# Patient Record
Sex: Female | Born: 1940 | Race: Black or African American | Hispanic: No | Smoking: Never smoker
Health system: Southern US, Community
[De-identification: ages and names within clinical notes are randomized; demographics above are authoritative.]

## PROBLEM LIST (undated history)

## (undated) DIAGNOSIS — R600 Localized edema: Secondary | ICD-10-CM

## (undated) DIAGNOSIS — M1990 Unspecified osteoarthritis, unspecified site: Secondary | ICD-10-CM

## (undated) DIAGNOSIS — K5790 Diverticulosis of intestine, part unspecified, without perforation or abscess without bleeding: Secondary | ICD-10-CM

## (undated) DIAGNOSIS — Z8709 Personal history of other diseases of the respiratory system: Secondary | ICD-10-CM

## (undated) DIAGNOSIS — D649 Anemia, unspecified: Secondary | ICD-10-CM

## (undated) DIAGNOSIS — R51 Headache: Secondary | ICD-10-CM

## (undated) DIAGNOSIS — M069 Rheumatoid arthritis, unspecified: Secondary | ICD-10-CM

## (undated) DIAGNOSIS — E785 Hyperlipidemia, unspecified: Secondary | ICD-10-CM

## (undated) DIAGNOSIS — F419 Anxiety disorder, unspecified: Secondary | ICD-10-CM

## (undated) DIAGNOSIS — M12811 Other specific arthropathies, not elsewhere classified, right shoulder: Secondary | ICD-10-CM

## (undated) DIAGNOSIS — E039 Hypothyroidism, unspecified: Secondary | ICD-10-CM

## (undated) DIAGNOSIS — I10 Essential (primary) hypertension: Secondary | ICD-10-CM

## (undated) DIAGNOSIS — K284 Chronic or unspecified gastrojejunal ulcer with hemorrhage: Secondary | ICD-10-CM

## (undated) DIAGNOSIS — M12812 Other specific arthropathies, not elsewhere classified, left shoulder: Secondary | ICD-10-CM

## (undated) DIAGNOSIS — R200 Anesthesia of skin: Secondary | ICD-10-CM

## (undated) DIAGNOSIS — K259 Gastric ulcer, unspecified as acute or chronic, without hemorrhage or perforation: Secondary | ICD-10-CM

## (undated) DIAGNOSIS — G4730 Sleep apnea, unspecified: Secondary | ICD-10-CM

## (undated) DIAGNOSIS — E669 Obesity, unspecified: Secondary | ICD-10-CM

## (undated) DIAGNOSIS — Z972 Presence of dental prosthetic device (complete) (partial): Secondary | ICD-10-CM

## (undated) DIAGNOSIS — Z9889 Other specified postprocedural states: Secondary | ICD-10-CM

## (undated) DIAGNOSIS — M810 Age-related osteoporosis without current pathological fracture: Secondary | ICD-10-CM

## (undated) DIAGNOSIS — Z9289 Personal history of other medical treatment: Secondary | ICD-10-CM

## (undated) DIAGNOSIS — M75102 Unspecified rotator cuff tear or rupture of left shoulder, not specified as traumatic: Secondary | ICD-10-CM

## (undated) DIAGNOSIS — M25559 Pain in unspecified hip: Secondary | ICD-10-CM

## (undated) DIAGNOSIS — R609 Edema, unspecified: Secondary | ICD-10-CM

## (undated) DIAGNOSIS — K2100 Gastro-esophageal reflux disease with esophagitis, without bleeding: Secondary | ICD-10-CM

## (undated) DIAGNOSIS — M2550 Pain in unspecified joint: Secondary | ICD-10-CM

## (undated) DIAGNOSIS — G4700 Insomnia, unspecified: Secondary | ICD-10-CM

## (undated) DIAGNOSIS — R351 Nocturia: Secondary | ICD-10-CM

## (undated) DIAGNOSIS — H269 Unspecified cataract: Secondary | ICD-10-CM

## (undated) DIAGNOSIS — R931 Abnormal findings on diagnostic imaging of heart and coronary circulation: Secondary | ICD-10-CM

## (undated) DIAGNOSIS — M1712 Unilateral primary osteoarthritis, left knee: Secondary | ICD-10-CM

## (undated) DIAGNOSIS — Z973 Presence of spectacles and contact lenses: Secondary | ICD-10-CM

## (undated) DIAGNOSIS — E079 Disorder of thyroid, unspecified: Secondary | ICD-10-CM

## (undated) DIAGNOSIS — Z9884 Bariatric surgery status: Secondary | ICD-10-CM

## (undated) DIAGNOSIS — K219 Gastro-esophageal reflux disease without esophagitis: Secondary | ICD-10-CM

## (undated) DIAGNOSIS — I5032 Chronic diastolic (congestive) heart failure: Secondary | ICD-10-CM

## (undated) DIAGNOSIS — R112 Nausea with vomiting, unspecified: Secondary | ICD-10-CM

## (undated) DIAGNOSIS — S46002D Unspecified injury of muscle(s) and tendon(s) of the rotator cuff of left shoulder, subsequent encounter: Secondary | ICD-10-CM

## (undated) HISTORY — DX: Hypothyroidism, unspecified: E03.9

## (undated) HISTORY — DX: Essential (primary) hypertension: I10

## (undated) HISTORY — PX: TOTAL THYROIDECTOMY: SHX2547

## (undated) HISTORY — DX: Hyperlipidemia, unspecified: E78.5

## (undated) HISTORY — PX: UPPER GASTROINTESTINAL ENDOSCOPY: SHX188

## (undated) HISTORY — PX: COLONOSCOPY: SHX174

## (undated) HISTORY — DX: Chronic or unspecified gastrojejunal ulcer with hemorrhage: K28.4

## (undated) HISTORY — DX: Anxiety disorder, unspecified: F41.9

## (undated) HISTORY — DX: Disorder of thyroid, unspecified: E07.9

## (undated) HISTORY — DX: Abnormal findings on diagnostic imaging of heart and coronary circulation: R93.1

## (undated) HISTORY — DX: Gastro-esophageal reflux disease with esophagitis, without bleeding: K21.00

## (undated) HISTORY — DX: Pain in unspecified hip: M25.559

## (undated) HISTORY — PX: JOINT REPLACEMENT: SHX530

## (undated) HISTORY — DX: Unspecified osteoarthritis, unspecified site: M19.90

## (undated) HISTORY — DX: Obesity, unspecified: E66.9

## (undated) HISTORY — PX: ABDOMINAL HYSTERECTOMY: SHX81

## (undated) HISTORY — DX: Rheumatoid arthritis, unspecified: M06.9

## (undated) HISTORY — DX: Unspecified cataract: H26.9

## (undated) HISTORY — PX: EYE SURGERY: SHX253

## (undated) HISTORY — DX: Sleep apnea, unspecified: G47.30

## (undated) HISTORY — DX: Localized edema: R60.0

## (undated) HISTORY — DX: Unspecified injury of muscle(s) and tendon(s) of the rotator cuff of left shoulder, subsequent encounter: S46.002D

## (undated) HISTORY — PX: ROTATOR CUFF REPAIR: SHX139

## (undated) HISTORY — DX: Bariatric surgery status: Z98.84

## (undated) HISTORY — PX: OTHER SURGICAL HISTORY: SHX169

## (undated) HISTORY — PX: CHOLECYSTECTOMY: SHX55

---

## 1998-05-19 ENCOUNTER — Encounter: Payer: Self-pay | Admitting: Orthopedic Surgery

## 1998-05-21 ENCOUNTER — Inpatient Hospital Stay (HOSPITAL_COMMUNITY): Admission: RE | Admit: 1998-05-21 | Discharge: 1998-05-27 | Payer: Self-pay | Admitting: Orthopedic Surgery

## 1998-05-21 ENCOUNTER — Encounter: Payer: Self-pay | Admitting: Orthopedic Surgery

## 1998-05-27 ENCOUNTER — Encounter: Payer: Self-pay | Admitting: Orthopedic Surgery

## 1998-08-27 ENCOUNTER — Encounter: Payer: Self-pay | Admitting: Orthopedic Surgery

## 1998-08-27 ENCOUNTER — Inpatient Hospital Stay (HOSPITAL_COMMUNITY): Admission: RE | Admit: 1998-08-27 | Discharge: 1998-09-02 | Payer: Self-pay | Admitting: Orthopedic Surgery

## 1999-02-23 ENCOUNTER — Inpatient Hospital Stay (HOSPITAL_COMMUNITY): Admission: AD | Admit: 1999-02-23 | Discharge: 1999-03-04 | Payer: Self-pay | Admitting: Orthopedic Surgery

## 1999-02-24 ENCOUNTER — Encounter: Payer: Self-pay | Admitting: Orthopedic Surgery

## 2002-05-17 ENCOUNTER — Encounter: Payer: Self-pay | Admitting: Orthopedic Surgery

## 2002-05-21 ENCOUNTER — Inpatient Hospital Stay (HOSPITAL_COMMUNITY): Admission: RE | Admit: 2002-05-21 | Discharge: 2002-05-26 | Payer: Self-pay | Admitting: Orthopedic Surgery

## 2002-05-21 ENCOUNTER — Encounter: Payer: Self-pay | Admitting: Orthopedic Surgery

## 2002-05-22 ENCOUNTER — Encounter: Payer: Self-pay | Admitting: Orthopedic Surgery

## 2002-05-26 ENCOUNTER — Inpatient Hospital Stay
Admission: RE | Admit: 2002-05-26 | Discharge: 2002-06-01 | Payer: Self-pay | Admitting: Physical Medicine & Rehabilitation

## 2002-07-10 ENCOUNTER — Encounter (HOSPITAL_COMMUNITY): Admission: RE | Admit: 2002-07-10 | Discharge: 2002-08-09 | Payer: Self-pay | Admitting: Orthopedic Surgery

## 2003-05-09 ENCOUNTER — Other Ambulatory Visit: Admission: RE | Admit: 2003-05-09 | Discharge: 2003-05-09 | Payer: Self-pay | Admitting: General Surgery

## 2003-05-28 ENCOUNTER — Ambulatory Visit (HOSPITAL_COMMUNITY): Admission: RE | Admit: 2003-05-28 | Discharge: 2003-05-28 | Payer: Self-pay | Admitting: Pulmonary Disease

## 2005-03-23 ENCOUNTER — Encounter: Admission: RE | Admit: 2005-03-23 | Discharge: 2005-03-23 | Payer: Self-pay | Admitting: Otolaryngology

## 2005-03-31 ENCOUNTER — Ambulatory Visit (HOSPITAL_COMMUNITY): Admission: RE | Admit: 2005-03-31 | Discharge: 2005-04-02 | Payer: Self-pay | Admitting: Otolaryngology

## 2005-03-31 ENCOUNTER — Encounter (INDEPENDENT_AMBULATORY_CARE_PROVIDER_SITE_OTHER): Payer: Self-pay | Admitting: Specialist

## 2006-03-28 ENCOUNTER — Ambulatory Visit (HOSPITAL_COMMUNITY): Admission: RE | Admit: 2006-03-28 | Discharge: 2006-03-28 | Payer: Self-pay | Admitting: Pulmonary Disease

## 2007-05-01 ENCOUNTER — Ambulatory Visit (HOSPITAL_COMMUNITY): Admission: RE | Admit: 2007-05-01 | Discharge: 2007-05-01 | Payer: Self-pay | Admitting: Pulmonary Disease

## 2008-05-07 ENCOUNTER — Ambulatory Visit (HOSPITAL_COMMUNITY): Admission: RE | Admit: 2008-05-07 | Discharge: 2008-05-07 | Payer: Self-pay | Admitting: Pulmonary Disease

## 2009-05-20 ENCOUNTER — Ambulatory Visit (HOSPITAL_COMMUNITY): Admission: RE | Admit: 2009-05-20 | Discharge: 2009-05-20 | Payer: Self-pay | Admitting: Pulmonary Disease

## 2009-09-15 DIAGNOSIS — Z9884 Bariatric surgery status: Secondary | ICD-10-CM | POA: Insufficient documentation

## 2010-06-15 ENCOUNTER — Ambulatory Visit (HOSPITAL_COMMUNITY): Admission: RE | Admit: 2010-06-15 | Discharge: 2010-06-15 | Payer: Self-pay | Admitting: Pulmonary Disease

## 2010-12-18 NOTE — Discharge Summary (Signed)
NAMEMARGARETTE, [name]                          ACCOUNT NO.:  [id number]   MEDICAL RECORD NO.:  [id number]                   PATIENT TYPE:  INP   LOCATION:  5036                                 FACILITY:  [facility]   PHYSICIAN:  Mila Homer. Sherlean Foot, M.D.              DATE OF BIRTH:  [birth year]   DATE OF ADMISSION:  05/21/2002  DATE OF DISCHARGE:  05/26/2002                                 DISCHARGE SUMMARY   ADMISSION DIAGNOSES:  1. Failed right total hip arthroplasty.  2. Hypertension.  3. Thyroid disease.  4. Obesity.   DISCHARGE DIAGNOSES:  1. Revision, right total hip arthroplasty.  2. Hypertension.  3. Thyroid disease.  4. Obesity.  5. Asymptomatic postoperative blood loss anemia.   HISTORY OF PRESENT ILLNESS:  The patient is a 70 year old black female with  a history of right total hip arthroplasty in 69.  She did develop a  superficial abscess in 2000 but otherwise, the patient had good results with  her hip.  The patient did start having soreness in her right hip  approximately two weeks prior to admission into the hospital and a popping  and a sense of it dislocating one week prior.  The patient denies any known  injury.  She does have soreness over the lateral aspect of the thigh with  any type of rotation.  X-rays revealed a rotated acetabular component with  dislocation of the femoral ball from the acetabular cup.   ALLERGIES:  No known drug allergies.   CURRENT MEDICATIONS:  1. Norvasc 5 mg p.o. every day.  2. Synthroid 0.125 mg p.o. every day.  3. Vioxx 25 mg p.o. every day.  4. Os-Cal 1500 mg p.o. every day.  5. Lasix 40 mg p.o. p.r.n.   SURGICAL PROCEDURE:  On May 21, 2002, the patient was taken to the  operating room by Dr. Mila Homer. Lucey, assisted by Jamelle Rushing, P.A.  Under general anesthesia, the patient underwent a revision of her failed  right total hip arthroplasty with a revision of the acetabular component.  The patient tolerated the  procedure well, there were no complications, no  drains were left in place, estimated blood loss was 500 cc and the patient  was transferred to the recovery room and then to the orthopedic floor in  good condition.   CONSULTANTS:  Routine physical therapy, occupational therapy, rehab, case  management consults were requested.   HOSPITAL COURSE:  On May 21, 2002, the patient was admitted to [facility] under the care of Dr. Georgena Spurling.  The patient was taken to  the OR where a failed right total hip arthroplasty was performed with a  revision of the acetabular component.  Procedure was completed without any  complications, patient tolerated it well and was transferred to the recovery  room and then to the orthopedic floor for routine postop care.  The patient  was started on Lovenox for routine DVT prophylaxis.   The patient then incurred a total of five days of postoperative care on the  orthopedic floor in which the patient did develop some asymptomatic  postoperative blood loss anemia; she also had some low-grade temperatures  which resolved on their own with no other focal cause.  Her vital signs  remained stable.  Her wound remained benign for any signs of infection.  Her  leg remained neuromotor and vascularly intact.  The patient worked well with  physical therapy but due to the fact of her obesity and her living alone, it  was felt that the patient needed a rehab stay prior to being discharged home  so she could be totally independent; arrangements were made on the [facility] and  she was transferred to that unit in good condition.   LABORATORY AND ACCESSORY CLINICAL DATA:  EKG on admission was normal sinus  rhythm at 79 beats per minute.   Chest x-ray preoperatively from a previous hospitalization was no acute  disease.   CBC on October 23rd:  WBC 7.3, hemoglobin 9.2, hematocrit 27.0, platelets  175,000.  Routine chemistries on October 22nd:  Sodium of 137,  potassium of  3.8, glucose 103, BUN 6, creatinine 0.8.  A routine urinalysis on admission  was negative.   MEDICATIONS UPON DISCHARGE FROM ORTHOPEDIC FLOOR:  1. Colace 100 mg p.o. b.i.d.  2. Trinsicon one capsule p.o. t.i.d.  3. Lovenox 30 mg subcut. q.12h.  4. Norvasc 5 mg p.o. every day.  5. Synthroid 0.125 mg p.o. every day.  6. Lasix 40 mg p.o. every day.  7. Calcium carbonate 1500 mg p.o. every day.  8. Vioxx 50 mg p.o. every day.  9. Laxative or enema of choice p.r.n.  10.      Percocet one or two tablets every four to six hours p.r.n.  11.      Tylenol 650 mg p.o. q.4h. p.r.n.  12.      Robaxin 500 mg p.o. q.6h. p.r.n.   DISCHARGE INSTRUCTIONS:  1. Medications:  Patient to continue routine medications as dispensed on     orthopedic floor.  2. Diet:  No restrictions.  3. Activity:  The patient may be weightbearing as tolerated with the use of     a walker and close supervision.  4. Wound care:  The patient should have her wound checked daily for any     signs of infection.  Staples are to be removed on postop day #14.  5. Followup:  The patient should have a followup appointment with Dr.     Georgena Spurling two weeks from date of discharge on the subacute care unit.   CONDITION ON DISCHARGE:  The patient's condition upon discharge from [facility] is  good.     Jamelle Rushing, P.A.                      Mila Homer. Sherlean Foot, M.D.   RWK/MEDQ  D:  06/12/2002  T:  06/13/2002  Job:  [id number]

## 2010-12-18 NOTE — Discharge Summary (Signed)
[name], [name]                          ACCOUNT NO.:  [id number]   MEDICAL RECORD NO.:  [id number]                   PATIENT TYPE:   LOCATION:                                       FACILITY:  [facility]   PHYSICIAN:  [name], M.D.             DATE OF BIRTH:  06-Mar-1941   DATE OF ADMISSION:  05/26/2002  DATE OF DISCHARGE:  06/01/2002                                 DISCHARGE SUMMARY   DISCHARGE DIAGNOSES:  1. Revision, right total hip arthroplasty 05/21/02.  2. Anemia.  3. Hypertension.  4. Hypothyroidism.  5. 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   HISTORY OF PRESENT ILLNESS:  A 70 year old black female with history of a  right total hip replacement  in '99, subsequent superficial abscess in 2000,  admitted 10/20 with increased right hip pain, recent dislocation. On  evaluation, x-rays with rotated acetabulum of the cup with dislocation.  Underwent revised right total hip arthroplasty 10/20 per Dr. Sherlean [name]. Placed  on subcutaneous Lovenox for deep vein thrombosis prophylaxis, weight bearing  as tolerated with hip precautions. Hospital course uneventful. No chest  pain. No shortness of breath. Closed supervision for ambulation. Minimal  assist transfers. Latest hemoglobin 9.2. Chemistries unremarkable. Admitted  for comprehensive rehab program.   PAST MEDICAL HISTORY:  Past medical history of hypertension and  hypothyroidism.   PAST SURGICAL HISTORY:  1. Hysterectomy.  2. Cholecystectomy.   ALLERGIES:  None.   HABITS:  No alcohol or tobacco.   MEDICAL PHYSICIAN:  [name], M.D., of [place].   MEDICATIONS PRIOR TO ADMISSION:  1. Norvasc.  2. Synthroid.  3. Vioxx.  4. Os-Cal.  5. Lasix as needed.   SOCIAL HISTORY:  Lives alone in [name]. Independent prior to admission.  She is on disability. She lives in a one-level home with four steps to  entry. Local brother with poor health, limited assistance. She does have a  sister who can  provide some care.   HOSPITAL COURSE:  The patient did well on rehabilitation services with  therapies initiated daily. The following issues are followed during  patient's rehab course:  1. Pertaining to [name]'s revised right total hip arthroplasty:     Surgical site healing nicely. No signs of infection. Neurovascular     sensation remained intact. She was ambulating with supervision, weight     bearing as tolerated with hip precautions. She would follow up with Dr.     Sherlean [name] of orthopedic services. She was maintained on subcutaneous Lovenox     for deep vein thrombosis prophylaxis. She would complete Lovenox protocol     upon her discharge. Postoperative anemia was stable, latest hemoglobin     10.1, hematocrit 31.3. There were no bleeding episodes. Blood pressure is     controlled on home doses of Norvasc. There was no orthostatic changes.     She had no bowel or  bladder disturbances. She continued on hormone     therapy for her hypothyroidism. She had a history of a left total hip     replacement in '99. This was without issue during her rehab course.   LABORATORY DATA:  Latest labs showed a sodium of 139, potassium 4.1, BUN 15,  creatinine 0.9, hemoglobin 10.1, hematocrit 31.3.   FUNCTIONAL MOBILITY:  At time of discharge, she was supervision for all  areas of activities of daily living except needing some assistance for lower  body bathing, supervision for her toileting, ambulating with supervision.  Home health therapy would be provided by [name].   DISCHARGE MEDICATIONS:  1. Lovenox 30 mg twice daily for three more days and discontinue.  2. Trinsicon one capsule twice daily.  3. Norvasc 5 mg daily.  4. Synthroid 125 mcg daily.  5. Vioxx 25 mg daily.  6. Tylox as needed pain.   ACTIVITY:  Weight bearing as tolerated with hip precautions.   DIET:  Regular.   WOUND CARE:  Cleanse incision daily with warm soap and water. Call Dr. Sherlean [name]  if any increased redness,  drainage, or fever.    SPECIAL INSTRUCTIONS:  Home health physical and occupational therapy per  Kathy [name]. She should follow up with Dr. Kari [name], medical management.     Kathy Dollar, PA                       [name], M.D.    DA/MEDQ  D:  05/31/2002  T:  05/31/2002  Job:  [id number]   cc:   [name], M.D.  [address] [place]  [place] [address]  Fax: [phone]   Mila Homer. [name], M.D.   [name], M.D.

## 2010-12-18 NOTE — Discharge Summary (Signed)
[name], [name]                ACCOUNT NO.:  [id number]   MEDICAL RECORD NO.:  [id number]          PATIENT TYPE:  INP   LOCATION:  5729                         FACILITY:  [facility]   PHYSICIAN:  Kinnie Manny. Annalee Genta, M.D.DATE OF BIRTH:  1940-11-17   DATE OF ADMISSION:  03/31/2005  DATE OF DISCHARGE:  04/02/2005                                 DISCHARGE SUMMARY   ADMISSION DIAGNOSIS:  Large bilateral thyroid mass.   POSTOPERATIVE DIAGNOSES:  1.  Large bilateral thyroid mass.  2.  Transient postoperative hypocalcemia.   SURGICAL PROCEDURE:  Total thyroidectomy with nerve integrity monitoring  system (March 31, 2005).   DISPOSITION:  The patient is discharged to home in stable condition in the  company of her family.   DISCHARGE MEDICATIONS:  Discharge medications include her admission  medications:  1.  Voltaren 75 mg p.o. b.i.d.  2.  Norvasc 5 mg p.o. q.p.m.  3.  Synthroid 0.125 mg p.o. q.a.m.  4.  Calcium with vitamin D two tablets p.o. b.i.d.  5.  Darvocet on hold.   Additional discharge medications include:  1.  Augmentin 500 mg p.o. b.i.d. for 10 days.  2.  Percocet 5/325 mg one to two tablets every 4-6 hours as needed, dispense      30 without refills.   ADDITIONAL DISCHARGE INSTRUCTIONS:   WOUND CARE:  One-half-strength hydrogen peroxide and bacitracin ointment on  a twice-daily basis.  The patient may shower and bathe after April 05, 2005.   ACTIVITY:  Activity is limited, no lifting, straining or driving for 2  weeks.   DIET:  The patient has no dietary restrictions.   FOLLOWUP:  She will follow up with me in 1 week for postoperative care or  sooner if warranted.   BRIEF HISTORY:  The patient is a 70 year old black female with a  longstanding history of gradually enlarging thyroid mass.  The patient has  been seen and treated by an endocrinologist in [place] and despite  thyroid suppression therapy with Synthroid, the patient has had an increase  in  the size of her thyroid mass.  She was having mild symptoms of dysphagia  and compression.  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  There were also some areas of calcification significant bilateral  compression of the trachea.  Given the patient's history, the examination  and physical findings, I recommended we undertake a total thyroidectomy  under general anesthesia at [facility].  The risks, benefits and  possible complications of the surgical procedure were discussed in detail  and the patient understood and concurred with our plan for surgery, which  was scheduled as above.   HOSPITAL COURSE:  The patient was admitted to the ENT service under Dr.  Thurmon Fair care on March 31, 2005 at [facility]  She was taken  to the operating room and under general anesthesia, a total thyroidectomy  was performed without complication or difficulty.  She was transferred from  the operating room to the Recovery and from Recovery to unit 5700 for  postoperative care.   The patient's postoperative course was uneventful.  She was ambulating on  the first night and tolerating a soft oral and liquid diet without  difficulty.  Pain was well-controlled with prescribed oral pain medications.  The patient's JP drain showed gradual reduction in output and on the morning  of the second postoperative day, the Jackson-Pratt drain was removed without  difficulty.  The patient's incision was dry and intact without erythema or  swelling.  She had good voicing and normal swallowing.  She had normal bowel  and bladder function.  The patient's serum blood calcium was also followed,  starting within normal limits; it had gradually drifted down and on the  second postoperative morning, was 7.9.  The patient was completely  asymptomatic and her medications including oral calcium supplementation with  vitamin D  were doubled.  She was instructed regarding signs of hypocalcemia  and will contact our office for further outpatient therapy if additional  symptoms occur.  The patient is comfortable with her discharge planning and  is discharged in the company of her family with the above discharge  instructions.  She will follow up with me in 1 week for postoperative care  or sooner if warranted.           ______________________________  Kinnie Ohagan. Annalee Genta, M.D.     DLS/MEDQ  D:  56/21/3086  T:  04/02/2005  Job:  [id number]

## 2010-12-18 NOTE — H&P (Signed)
[name], Kathy [name]                          ACCOUNT NO.:  [id number]   MEDICAL RECORD NO.:  [id number]                   PATIENT TYPE:  INP   LOCATION:  NA                                   FACILITY:  [facility]   PHYSICIAN:  Mila Homer. Sherlean Foot, M.D.              DATE OF BIRTH:  1940-11-15   DATE OF ADMISSION:  05/15/2002  DATE OF DISCHARGE:                                HISTORY & PHYSICAL   CHIEF COMPLAINT:  Right hip soreness with a sensation of dislocating.   HISTORY OF PRESENT ILLNESS:  The patient is a 70 year old black female with  a history of right total hip arthroplasty in 1999.  The patient then  subsequently had a superficial abscess of the right hip incision in 2000,  was taken back with I&D and had good results up until approximately two  weeks ago.  The patient started developing a general soreness in her right  hip with any type of ambulation.  Just last week she started noting a  sensation as if the ball was slipping in and out of the hip. She presented  to Dr. Candise Bowens office and x-rays were taken and it was shown that the hip  was currently dislocated with a rotation of the acetabular component  completely vertical position. The patient describes soreness over the  lateral aspect of the hip with any type of walking or movement. She does  have some sensation of popping in and out of the right hip. She denies any  known injury to it or any cause. She is currently using a cane since this  last Friday.   ALLERGIES:  No known drug allergies.   MEDICATIONS:  1. Norvasc 5 mg p.o. q.d.  2. Synthroid 0.125 mg p.o. q.d.  3. Vioxx 25 mg p.o. q.d.  4. Os-Cal 1500 mg p.o. q.d.  5. Lasix 40 mg p.o. p.r.n.   PAST MEDICAL HISTORY:  Hypertension.  Thyroid disease. Obesity.   PAST SURGICAL HISTORY:  Hysterectomy, cholecystectomy, right and left total  hip replacements. The patient denies any surgical complications with  anesthesia, but she did develop an abscess over her  right hip six months  status post right total hip arthroplasty.   SOCIAL HISTORY:  The patient is a 70 year old obese black female who denies  any history of smoking or alcohol use.  She is currently divorced.  She  lives in a [place] house with four steps to the main entrance. She is a  disabled Agricultural engineer.   PRIMARY CARE PHYSICIAN:  Edward L. Juanetta Gosling, M.D.   FAMILY HISTORY:  Mother is deceased from congestive heart failure. Father is  deceased from lung cancer. The patient has three brothers with a history of  thyroid cancer and diabetes, neck and back problems.  One sister deceased  with thrombophlebitis after a total knee arthroplasty and one sister alive  with diabetes mellitus.   REVIEW OF  SYSTEMS:  Positive for upper partial plate.  She does use glasses  at all times. She does have some decreased hearing in her left ear. She does  not have any palpitations unless she misses her thyroid medicine.  All other  review of systems are negative.   PHYSICAL EXAMINATION:  VITAL SIGNS:  Height is 5 foot 3 inches, weight 240  pounds, temperature 96.5, blood pressure 160/96, respirations 14, pulse 64  and regular.  GENERAL:  This is a healthy-appearing, well-developed, obese, black female.  She ambulates with a cane in her left hand. She does have difficulty getting  on and off the examination table and she does have difficulty with  ambulation and must stand for a few seconds before initiating ambulation  from the sitting position.  HEENT:  Head was normocephalic and atraumatic, nontender over maxillary and  frontal sinuses.  Pupils equal, round, and reactive to light and  accommodation. Extraocular movements intact. Sclerae is nonicteric.  Conjunctivae pink and moist. External ears without deformities, canals  patent, TMs pearly gray and intact. Gross hearing is intact. Nasal septum  midline. Mucous membranes pink and moist. No polyps.  Oral buccal mucosa  pink and moist and  without lesions.  Upper partial plate was in place. Lower  dentition was in fair repair.  Uvula was midline. The patient is able to  swallow without any difficulty.  NECK:  Supple, no palpable lymphadenopathy.  Thyroid gland was nontender.  The patient had good range of motion of her cervical spine without any  difficulty and no tenderness along the spinal column with percussion.  CHEST:  Lung sounds were clear and equal bilaterally. No wheezes, rales, or  rhonchi.  HEART:  Regular rate and rhythm, S1 and S2 was auscultated. No murmurs,  rubs, or gallops noted.  ABDOMEN:  Round, obese, unable to palpate any hepatosplenomegaly due to  obesity. CVA was nontender. She was nontender to deep palpation. Bowel  sounds were normal active throughout.  EXTREMITIES: Upper extremities were symmetrically size and shape. She had  excellent range of motion of her shoulders, elbows, and wrists. Motor  strength was 5/5.  Lower extremities; the patient had bilateral well-healed  lateral incisions over the hips. The left hip had full extension with some  flexion up to 120 degrees, limited by central obesity.  She had 20 degrees  internal and external rotation without discomfort. The right hip had about 5  degrees short of full extension, flexion up to about 80 degrees. She had  about 5 degrees of internal and external rotation limited by discomfort in  the hip.  Knees were symmetrically size and shape, no signs of erythema or  ecchymosis.  No palpable effusions. She had good range of motion without any  difficulty. Calves were nontender. Ankles were symmetrical with good dorsi  and plantar flexion.  NEUROLOGY:  The patient was conscious, alert, and appropriate.  Held an easy  conversation with examiner. Cranial nerves II-XII grossly intact.  The  patient was grossly intact to light touch sensation from head to toe. PERIPHERAL VASCULATURE: Carotid pulses were 2+, no bruits. Radial pulses 2+.  Dorsalis pedis  1+ and posterior tibial pulses were 2+.  The patient had no  lower extremity varicosities. She had trace edema.  BREASTS:  RECTAL:  GENITOURINARY:  Deferred at this time.   IMPRESSION:  1. Failed left total hip arthroplasty, acetabulum with current dislocation.  2. Hypertension.  3. Thyroid disease.  4. Obesity.   PLAN:  The patient will be admitted to [facility] on May 11, 2002, under the care of Mila Homer. Sherlean Foot, M.D.  The patient will undergo all  routine labs and tests prior to having revision of her right total hip  arthroplasty acetabular component.     Jamelle Rushing, P.A.                      Mila Homer. Sherlean Foot, M.D.    RWK/MEDQ  D:  05/14/2002  T:  05/15/2002  Job:  [id number]

## 2010-12-18 NOTE — Op Note (Signed)
NAMEANALYSSE, QUINONEZ                          ACCOUNT NO.:  [id number]   MEDICAL RECORD NO.:  [id number]                   PATIENT TYPE:  INP   LOCATION:  2550                                 FACILITY:  [facility]   PHYSICIAN:  Mila Homer. Sherlean Foot, M.D.              DATE OF BIRTH:  [birth year]   DATE OF PROCEDURE:  05/21/2002  DATE OF DISCHARGE:                                 OPERATIVE REPORT   PREOPERATIVE DIAGNOSIS:  Right failed total hip arthroplasty.   POSTOPERATIVE DIAGNOSIS:  Right failed total hip arthroplasty.   PROCEDURE:  Right revision total hip arthroplasty (acetabulum only and  modifier 22 from extraordinary morbid obesity).   ESTIMATED BLOOD LOSS:  500 cc.   COMPLICATIONS:  None.   DRAINS:  None.   INDICATION FOR PROCEDURE:  The patient is an elderly white female who has  undergone total hip arthroplasty by my partner, W. Dava Najjar, M.D., some  years ago and has two weeks' worth of instability.  X-rays were taken and  showed dislocation of the hip, and she was referred to me for surgery by Dr.  Madelon Lips.  Informed consent was obtained.  Please note, modifier 22 was  necessary for this operation.  The patient was probably 10 standard  deviations above her normal body weight and made this surgery five to 10  times more difficult than done on someone with normal body weight.  Also  note that her extraordinary body weight would make the outcome of the  previous surgery as well as this surgery less than optimal.   DESCRIPTION OF PROCEDURE:  The patient was laid supine, administered general  anesthesia, and a Foley catheter placed.  The old incision was used and made  with a #10 blade.  A new 10 blade was used to go down to and through the  fascia lata.  There was actually no layer of fascia lata intact at all.  I  did a bursectomy and then developed a subperiosteal posterior sleeve off of  the femur, taking down the short external rotators, tagging them.  I then  performed an aggressive synovectomy of scar tissue within the hip exposing  the femoral head, which was dislocated and perched on the superior rim of  the bone.  I tamped off the 28 mm head and at this point placed the neck of  the prosthesis anterior to the acetabulum and held it in place with a  Hohmann retractor.  I then continued to perform synovectomy and then used a  drill and a screw to remove the polyethylene.  At this point I placed in the  central locking device into the apex of the cup.  It was so loose that I  could actually remove it just with the inserter.  At this point I used a  rongeur and curette to debride out the soft tissue that was behind the cup.  The cup  was actually fibrous ingrown with approximately 30-40% with no bony  ingrowth whatsoever.  At this point I switched sides of the table with my  physician assistant and sequentially reamed from a 50 up to a 53 and placed  in a Pinnacle fully porous-coated cup size 54 sector, with three holes aimed  superiorly. Once it was tamped down and was very, very stable to rocking of  the pelvis, I placed two superior screws, one screw was 25 mm long, the  other was 20 mm long.  At this point the acetabulum was quite stable, and I  trialled with a 10 degree, 54 mm offset liner, which was a 4+ offset, and  attempted a 36 mm head.  I placed various sizes of 36 mm heads on and chose  a +5.  I then removed the trials, irrigated copiously with the pulse lavage  system, and then tamped in a real 10 degree liner, which was 4+ offset, 54  mm in diameter, and tamped onto a clean Morse taper a 36 mm +5 head.  This  gave it excellent stability.  There was only impingement against her soft  tissue pannus anteriorly, but I felt she had excellent stability and  excellent purchase of the acetabulum into her bone.  At this point I again  irrigated and then closed the posterior soft sleeve through two drill holes  in the trochanter trying to  recreate a fascia lata, and closed that with  interrupted #1 Vicryl, closed the deep soft tissues with a layer of 0  Vicryl, and then a subcuticular 2-0 Vicryl and skin staples.  The patient  tolerated the procedure well.  I dressed with Adaptic, 4 x 4's, ABD, and  sterile Ioban drape.                                               Mila Homer. Sherlean Foot, M.D.    SDL/MEDQ  D:  05/21/2002  T:  05/22/2002  Job:  [id number]

## 2010-12-18 NOTE — Op Note (Signed)
NAMEHAMPTON, Kathy [name]                ACCOUNT NO.:  [id number]   MEDICAL RECORD NO.:  [id number]          PATIENT TYPE:  INP   LOCATION:  2857                         FACILITY:  [facility]   PHYSICIAN:  Kinnie Carrizales. Annalee Genta, M.D.DATE OF BIRTH:  10-04-1940   DATE OF PROCEDURE:  03/31/2005  DATE OF DISCHARGE:                                 OPERATIVE REPORT   PREOPERATIVE DIAGNOSIS:  Diffuse thyroid enlargement consistent with  multinodular goiter.   POSTOPERATIVE DIAGNOSIS:  Diffuse thyroid enlargement consistent with  multinodular goiter.   INDICATIONS FOR SURGERY:  Diffuse thyroid enlargement consistent with  multinodular goiter.   SURGICAL PROCEDURES:  Total thyroidectomy with nerve integrity monitoring  (NIMS).   SURGEON:  Kinnie Mallin. Annalee Genta, M.D.   ASSISTANT:  Dr. Brynda Peon.   ANESTHESIA:  General endotracheal.   ESTIMATED BLOOD LOSS:  100 mL.   COMPLICATIONS:  None.   Patient transferred from the operating room to the recovery in stable  condition.   BRIEF HISTORY:  Kathy [name] is a 70 year old black female who was evaluated  with a history of a gradually enlarging neck mass. The patient had a  longstanding history of multinodular goiter and had been followed by her  endocrinologist in [place], [place]. She had been on thyroid  suppression therapy and despite chronic use of Synthroid, the patient had  continued enlargement of the gland. She was having mild intermittent  pressure sensation with shortness of breath and dyspnea on exertion as well  as some mild intermittent dysphagia. Examination revealed significant  bilateral thyroid gland enlargement consistent with multinodular goiter.  A  CT scan was obtained and this multiple cystic changes within the thyroid  gland with significant bilateral enlargement and compression of the trachea,  left greater than right. Given the patient's history, examination and  progressive symptoms, I recommended that we  undertake a total thyroidectomy  under general anesthesia. Risks, benefits and  possible complications of  procedure were discussed in detail and the patient understood and concurred  with our plan for surgery which is scheduled for March 31, 2005.   SURGICAL PROCEDURE:  The patient brought to the operating room at [facility] OR on March 31, 2005, and placed in the supine  position on the operating table. General endotracheal anesthesia was  established without difficulty. [facility] nerve monitoring system was  used throughout the surgical procedure to insure protection of the recurrent  laryngeal nerves and good bilateral vocal cord contact was confirmed prior  to starting the case. The patient was positioned on the operating table and  injected with a total of 4 mL of 1% lidocaine 1:100,000 solution of  epinephrine injected in the proposed skin incision.  Patient was then  prepped and draped in sterile fashion and the surgical procedure was begun.   An approximately 8 cm curvilinear low collar incision was created.  This was  carried through the skin, underlying subcutaneous tissue, anterior aspect of  platysma muscle was identified bilaterally and divided and subplatysmal  flaps were elevated superiorly and inferiorly to allow access to  the  anterior compartment of the neck. The strap muscles were divided in the  midline and lateralized.  I began left-hand side, the left thyroid lobe was  gently dissected from the overlying strap and sternocleidomastoid muscles  allowing access to the left thyroid lobe. There was significant enlargement  and nodularity of the left thyroid gland and the lobe was gently reflected  medially. The middle thyroid vein was identified, divided and suture  ligated. Dissection was carried superiorly along the superior pedicle of the  left thyroid lobe.  The superior thyroid artery and vein were identified,  divided and suture  ligated with 2-0 silk suture. The gland was then  reflected further medially and dissection was carried out along the lateral  and inferior aspect of the gland.  The recurrent laryngeal nerve was  identified in the immediate subcapsular tissue on the lateral aspect of the  thyroid lobe. This was confirmed with the NIMS monitor and dissection was  then carefully carried out along the nerve, protecting it throughout its  course into its insertion along the cricothyroid membrane.  Dissection was  then carried inferiorly preserving the nerve and dissecting the overlying  soft tissue. The inferior thyroid pedicle was identified, divided and suture  ligated and the gland was then reflected further medially, dividing the  thyroid ligament along the anterior tracheal wall, preserving the trachea.  The patient's right thyroid lobe was then dissected, overlying strap muscles  reflected medially and the sternocleidomastoid muscle was elevated.  The mid  aspect of the thyroid gland was then dissected free of the surrounding soft  tissues and reflected medially.  The middle thyroid vein was identified,  divided and suture ligated. Dissection was then carried superiorly and the  superior thyroid pedicle was divided and suture ligated with 2-0 silk  suture. The gland was then reflected on its medial attachments and  dissection was then carried inferiorly.  The recurrent laryngeal nerve was  identified in the deep soft tissues and the inferior thyroid pedicle  including inferior thyroid artery and vein were elevated preserving the  nerve. These were divided and suture ligated and dissection was carried  along the inferior aspect of the gland, dissecting along the median aspect  of the thyroid capsule, dividing the small feeder vessels using suture  ligature and clips.  The inferior parathyroid gland was identified in this  region and reflected inferiorly. The entire thyroid was then reflected on the right  tracheal attachment and, preserving the recurrent laryngeal nerve,  was dissected free of the trachea.  The entire specimen was then sent to  pathology for gross microscopic evaluation.  The patient's wound  was then  thoroughly irrigated with saline solution. Several areas of point hemorrhage  were cauterized with bipolar cautery. The recurrent laryngeal nerves were  stimulated bilaterally at 0.50 on the NIMS monitor and we had good bilateral  stimulation of the vocal cords.  The patient's wound was again irrigated and  suctioned. There was no active bleeding. A 7 mm Blake drain was placed at  the base of the incision bilaterally and brought out through a separate stab  incision in the mid aspect of the neck, sutured in position with a 3-0  Ethilon suture.  The wound was then closed in multiple layers beginning with  reapproximation of the midline strap muscles with 3-0 Vicryl suture in  interrupted fashion,  platysma and subplatysmal layers were closed  individually with 4-0 Vicryl suture in interrupted fashion and deep  subcutaneous  tissue was closed with the same stitch. Final skin closure was  achieved with a 5-0 Ethilon in  running locked fashion.  The patient's wound was cleaned with saline gauze  and dressed with bacitracin ointment. The patient was then awakened from  anesthetic. She was extubated and transferred from the operating room to the  recovery in stable condition. There were no complications and blood loss was  less than 100 mL.           ______________________________  Kinnie Engelhard. Annalee Genta, M.D.     DLS/MEDQ  D:  74/25/9563  T:  03/31/2005  Job:  [id number]   cc:   Ramon Dredge L. Juanetta Gosling, M.D.  [address]  [place]  [place] [address]  Fax: [phone]

## 2011-02-18 ENCOUNTER — Ambulatory Visit (INDEPENDENT_AMBULATORY_CARE_PROVIDER_SITE_OTHER): Payer: Medicare PPO | Admitting: Otolaryngology

## 2011-02-18 DIAGNOSIS — H902 Conductive hearing loss, unspecified: Secondary | ICD-10-CM

## 2011-02-18 DIAGNOSIS — H6120 Impacted cerumen, unspecified ear: Secondary | ICD-10-CM

## 2011-08-19 ENCOUNTER — Other Ambulatory Visit (HOSPITAL_COMMUNITY): Payer: Self-pay | Admitting: Pulmonary Disease

## 2011-08-19 DIAGNOSIS — Z139 Encounter for screening, unspecified: Secondary | ICD-10-CM

## 2011-08-23 ENCOUNTER — Ambulatory Visit (HOSPITAL_COMMUNITY): Payer: Medicare HMO

## 2011-08-26 ENCOUNTER — Ambulatory Visit (HOSPITAL_COMMUNITY): Payer: Medicare HMO

## 2011-08-30 ENCOUNTER — Ambulatory Visit (HOSPITAL_COMMUNITY)
Admission: RE | Admit: 2011-08-30 | Discharge: 2011-08-30 | Disposition: A | Discharge status: Home / Self Care | Payer: Medicare HMO | Source: Ambulatory Visit | Attending: Pulmonary Disease | Admitting: Pulmonary Disease

## 2011-08-30 DIAGNOSIS — Z139 Encounter for screening, unspecified: Secondary | ICD-10-CM

## 2011-08-30 DIAGNOSIS — Z1231 Encounter for screening mammogram for malignant neoplasm of breast: Secondary | ICD-10-CM | POA: Insufficient documentation

## 2011-10-14 DIAGNOSIS — E569 Vitamin deficiency, unspecified: Secondary | ICD-10-CM | POA: Insufficient documentation

## 2011-10-14 DIAGNOSIS — E46 Unspecified protein-calorie malnutrition: Secondary | ICD-10-CM | POA: Insufficient documentation

## 2012-06-12 ENCOUNTER — Other Ambulatory Visit (HOSPITAL_COMMUNITY): Payer: Self-pay | Admitting: Orthopedic Surgery

## 2012-06-12 DIAGNOSIS — T84038A Mechanical loosening of other internal prosthetic joint, initial encounter: Secondary | ICD-10-CM

## 2012-06-12 DIAGNOSIS — Z96649 Presence of unspecified artificial hip joint: Secondary | ICD-10-CM

## 2012-06-14 ENCOUNTER — Encounter (HOSPITAL_COMMUNITY)
Admission: RE | Admit: 2012-06-14 | Discharge: 2012-06-14 | Disposition: A | Discharge status: Home / Self Care | Payer: Medicare Other | Source: Ambulatory Visit | Attending: Orthopedic Surgery | Admitting: Orthopedic Surgery

## 2012-06-14 ENCOUNTER — Encounter (HOSPITAL_COMMUNITY): Payer: Self-pay

## 2012-06-14 DIAGNOSIS — M25559 Pain in unspecified hip: Secondary | ICD-10-CM | POA: Insufficient documentation

## 2012-06-14 DIAGNOSIS — Z96649 Presence of unspecified artificial hip joint: Secondary | ICD-10-CM

## 2012-06-14 DIAGNOSIS — T84038A Mechanical loosening of other internal prosthetic joint, initial encounter: Secondary | ICD-10-CM

## 2012-06-14 MED ORDER — TECHNETIUM TC 99M MEDRONATE IV KIT
25.0000 | PACK | Freq: Once | INTRAVENOUS | Status: AC | PRN
  Administered 2012-06-14: 25 via INTRAVENOUS

## 2012-08-18 ENCOUNTER — Other Ambulatory Visit (HOSPITAL_COMMUNITY): Payer: Self-pay | Admitting: Pulmonary Disease

## 2012-08-18 DIAGNOSIS — Z139 Encounter for screening, unspecified: Secondary | ICD-10-CM

## 2012-08-31 ENCOUNTER — Ambulatory Visit (HOSPITAL_COMMUNITY): Payer: Medicare HMO

## 2013-06-06 ENCOUNTER — Other Ambulatory Visit: Payer: Self-pay | Admitting: Orthopedic Surgery

## 2013-06-06 ENCOUNTER — Ambulatory Visit
Admission: RE | Admit: 2013-06-06 | Discharge: 2013-06-06 | Disposition: A | Discharge status: Home / Self Care | Payer: Medicare Other | Source: Ambulatory Visit | Attending: Orthopedic Surgery | Admitting: Orthopedic Surgery

## 2013-06-06 DIAGNOSIS — M25552 Pain in left hip: Secondary | ICD-10-CM

## 2013-07-12 ENCOUNTER — Ambulatory Visit (HOSPITAL_COMMUNITY)
Admission: RE | Admit: 2013-07-12 | Discharge: 2013-07-12 | Disposition: A | Discharge status: Home / Self Care | Payer: Medicare Other | Source: Ambulatory Visit | Attending: Pulmonary Disease | Admitting: Pulmonary Disease

## 2013-07-12 DIAGNOSIS — Z139 Encounter for screening, unspecified: Secondary | ICD-10-CM

## 2013-07-12 DIAGNOSIS — Z1231 Encounter for screening mammogram for malignant neoplasm of breast: Secondary | ICD-10-CM | POA: Insufficient documentation

## 2013-08-20 ENCOUNTER — Other Ambulatory Visit (HOSPITAL_COMMUNITY): Payer: Self-pay | Admitting: Orthopedic Surgery

## 2013-08-20 DIAGNOSIS — M25552 Pain in left hip: Secondary | ICD-10-CM

## 2013-08-22 ENCOUNTER — Ambulatory Visit (HOSPITAL_COMMUNITY)
Admission: RE | Admit: 2013-08-22 | Discharge: 2013-08-22 | Disposition: A | Discharge status: Home / Self Care | Payer: Medicare HMO | Source: Ambulatory Visit | Attending: Orthopedic Surgery | Admitting: Orthopedic Surgery

## 2013-08-22 ENCOUNTER — Encounter (HOSPITAL_COMMUNITY): Payer: Self-pay

## 2013-08-22 DIAGNOSIS — M25552 Pain in left hip: Secondary | ICD-10-CM

## 2013-08-22 DIAGNOSIS — M47817 Spondylosis without myelopathy or radiculopathy, lumbosacral region: Secondary | ICD-10-CM | POA: Insufficient documentation

## 2013-08-22 DIAGNOSIS — Z96649 Presence of unspecified artificial hip joint: Secondary | ICD-10-CM | POA: Insufficient documentation

## 2013-08-22 DIAGNOSIS — M51379 Other intervertebral disc degeneration, lumbosacral region without mention of lumbar back pain or lower extremity pain: Secondary | ICD-10-CM | POA: Insufficient documentation

## 2013-08-22 DIAGNOSIS — M25559 Pain in unspecified hip: Secondary | ICD-10-CM | POA: Insufficient documentation

## 2013-08-22 DIAGNOSIS — M5137 Other intervertebral disc degeneration, lumbosacral region: Secondary | ICD-10-CM | POA: Insufficient documentation

## 2013-09-18 ENCOUNTER — Other Ambulatory Visit: Payer: Self-pay | Admitting: Orthopedic Surgery

## 2013-09-19 ENCOUNTER — Encounter (HOSPITAL_COMMUNITY): Payer: Self-pay | Admitting: Pharmacy Technician

## 2013-09-25 NOTE — Pre-Procedure Instructions (Signed)
[place]  09/25/2013   Your procedure is scheduled on:  Wed, Mar 4 @ 12:45 PM  Report to [facility] Short Stay Entrance A  At 10:45 AM.  Call this number if you have problems the morning of surgery: [phone]   Remember:   Do not eat food or drink liquids after midnight.   Take these medicines the morning of surgery with A SIP OF WATER: Xanax(Alprazolam),Amlodipine(Norvasc),Synthroid(Levothyroxine),and Tramadol(if needed)               Stop taking your Fish Oil. Stop using your Diclofenac. No Goody's,BC's,Aleve,Aspirin,Ibuprofen,or any Herbal Medications   Do not wear jewelry, make-up or nail polish.  Do not wear lotions, powders, or perfumes. You may wear deodorant.  Do not shave 48 hours prior to surgery.   Do not bring valuables to the hospital.  [facility] is not responsible                  for any belongings or valuables.               Contacts, dentures or bridgework may not be worn into surgery.  Leave suitcase in the car. After surgery it may be brought to your room.  For patients admitted to the hospital, discharge time is determined by your                treatment team.                 Special Instructions:  [facility] - Preparing for Surgery  Before surgery, you can play an important role.  Because skin is not sterile, your skin needs to be as free of germs as possible.  You can reduce the number of germs on you skin by washing with CHG (chlorahexidine gluconate) soap before surgery.  CHG is an antiseptic cleaner which kills germs and bonds with the skin to continue killing germs even after washing.  Please DO NOT use if you have an allergy to CHG or antibacterial soaps.  If your skin becomes reddened/irritated stop using the CHG and inform your nurse when you arrive at Short Stay.  Do not shave (including legs and underarms) for at least 48 hours prior to the first CHG shower.  You may shave your face.  Please follow these instructions carefully:   1.   Shower with CHG Soap the night before surgery and the                                morning of Surgery.  2.  If you choose to wash your hair, wash your hair first as usual with your       normal shampoo.  3.  After you shampoo, rinse your hair and body thoroughly to remove the                      Shampoo.  4.  Use CHG as you would any other liquid soap.  You can apply chg directly       to the skin and wash gently with scrungie or a clean washcloth.  5.  Apply the CHG Soap to your body ONLY FROM THE NECK DOWN.        Do not use on open wounds or open sores.  Avoid contact with your eyes,       ears, mouth and genitals (private parts).  Wash genitals (private  parts)       with your normal soap.  6.  Wash thoroughly, paying special attention to the area where your surgery        will be performed.  7.  Thoroughly rinse your body with warm water from the neck down.  8.  DO NOT shower/wash with your normal soap after using and rinsing off       the CHG Soap.  9.  Pat yourself dry with a clean towel.            10.  Wear clean pajamas.            11.  Place clean sheets on your bed the night of your first shower and do not        sleep with pets.  Day of Surgery  Do not apply any lotions/deoderants the morning of surgery.  Please wear clean clothes to the hospital/surgery center.     Please read over the following fact sheets that you were given: Pain Booklet, Coughing and Deep Breathing, Blood Transfusion Information, MRSA Information and Surgical Site Infection Prevention

## 2013-09-26 ENCOUNTER — Encounter (HOSPITAL_COMMUNITY)
Admission: RE | Admit: 2013-09-26 | Discharge: 2013-09-26 | Disposition: A | Discharge status: Home / Self Care | Payer: Medicare HMO | Source: Ambulatory Visit | Attending: Orthopedic Surgery | Admitting: Orthopedic Surgery

## 2013-09-26 ENCOUNTER — Encounter (HOSPITAL_COMMUNITY): Payer: Self-pay

## 2013-09-26 DIAGNOSIS — Z01818 Encounter for other preprocedural examination: Secondary | ICD-10-CM | POA: Insufficient documentation

## 2013-09-26 DIAGNOSIS — Z01812 Encounter for preprocedural laboratory examination: Secondary | ICD-10-CM | POA: Insufficient documentation

## 2013-09-26 DIAGNOSIS — Z01810 Encounter for preprocedural cardiovascular examination: Secondary | ICD-10-CM | POA: Insufficient documentation

## 2013-09-26 HISTORY — DX: Unspecified osteoarthritis, unspecified site: M19.90

## 2013-09-26 HISTORY — DX: Personal history of other medical treatment: Z92.89

## 2013-09-26 HISTORY — DX: Personal history of other diseases of the respiratory system: Z87.09

## 2013-09-26 HISTORY — DX: Pain in unspecified joint: M25.50

## 2013-09-26 HISTORY — DX: Age-related osteoporosis without current pathological fracture: M81.0

## 2013-09-26 HISTORY — DX: Edema, unspecified: R60.9

## 2013-09-26 HISTORY — DX: Other specified postprocedural states: R11.2

## 2013-09-26 HISTORY — DX: Essential (primary) hypertension: I10

## 2013-09-26 HISTORY — DX: Other specified postprocedural states: Z98.890

## 2013-09-26 HISTORY — DX: Anesthesia of skin: R20.0

## 2013-09-26 HISTORY — DX: Nocturia: R35.1

## 2013-09-26 HISTORY — DX: Localized edema: R60.0

## 2013-09-26 HISTORY — DX: Headache: R51

## 2013-09-26 HISTORY — DX: Hypothyroidism, unspecified: E03.9

## 2013-09-26 HISTORY — DX: Diverticulosis of intestine, part unspecified, without perforation or abscess without bleeding: K57.90

## 2013-09-26 HISTORY — DX: Anxiety disorder, unspecified: F41.9

## 2013-09-26 LAB — URINALYSIS, ROUTINE W REFLEX MICROSCOPIC
Bilirubin Urine: NEGATIVE
Glucose, UA: NEGATIVE mg/dL
Hgb urine dipstick: NEGATIVE
Ketones, ur: NEGATIVE mg/dL
Leukocytes, UA: NEGATIVE
Nitrite: NEGATIVE
Protein, ur: NEGATIVE mg/dL
Specific Gravity, Urine: 1.017 (ref 1.005–1.030)
Urobilinogen, UA: 0.2 mg/dL (ref 0.0–1.0)
pH: 8.5 — ABNORMAL HIGH (ref 5.0–8.0)

## 2013-09-26 LAB — ABO/RH: ABO/RH(D): AB POS

## 2013-09-26 LAB — CBC WITH DIFFERENTIAL/PLATELET
Basophils Absolute: 0 10*3/uL (ref 0.0–0.1)
Basophils Relative: 0 % (ref 0–1)
Eosinophils Absolute: 0.1 10*3/uL (ref 0.0–0.7)
Eosinophils Relative: 2 % (ref 0–5)
HCT: 35.4 % — ABNORMAL LOW (ref 36.0–46.0)
Hemoglobin: 11.9 g/dL — ABNORMAL LOW (ref 12.0–15.0)
Lymphocytes Relative: 56 % — ABNORMAL HIGH (ref 12–46)
Lymphs Abs: 2.6 10*3/uL (ref 0.7–4.0)
MCH: 31.5 pg (ref 26.0–34.0)
MCHC: 33.6 g/dL (ref 30.0–36.0)
MCV: 93.7 fL (ref 78.0–100.0)
Monocytes Absolute: 0.4 10*3/uL (ref 0.1–1.0)
Monocytes Relative: 8 % (ref 3–12)
Neutro Abs: 1.6 10*3/uL — ABNORMAL LOW (ref 1.7–7.7)
Neutrophils Relative %: 33 % — ABNORMAL LOW (ref 43–77)
Platelets: 217 10*3/uL (ref 150–400)
RBC: 3.78 MIL/uL — ABNORMAL LOW (ref 3.87–5.11)
RDW: 13.8 % (ref 11.5–15.5)
WBC: 4.6 10*3/uL (ref 4.0–10.5)

## 2013-09-26 LAB — BASIC METABOLIC PANEL
BUN: 10 mg/dL (ref 6–23)
CO2: 29 mEq/L (ref 19–32)
Calcium: 9.8 mg/dL (ref 8.4–10.5)
Chloride: 102 mEq/L (ref 96–112)
Creatinine, Ser: 0.8 mg/dL (ref 0.50–1.10)
GFR calc Af Amer: 84 mL/min — ABNORMAL LOW (ref >90)
GFR calc non Af Amer: 72 mL/min — ABNORMAL LOW (ref >90)
Glucose, Bld: 74 mg/dL (ref 70–99)
Potassium: 4 mEq/L (ref 3.7–5.3)
Sodium: 141 mEq/L (ref 137–147)

## 2013-09-26 LAB — PROTIME-INR
INR: 0.96 (ref 0.00–1.49)
Prothrombin Time: 12.6 seconds (ref 11.6–15.2)

## 2013-09-26 LAB — TYPE AND SCREEN
ABO/RH(D): AB POS
Antibody Screen: NEGATIVE

## 2013-09-26 LAB — SURGICAL PCR SCREEN
MRSA, PCR: NEGATIVE
Staphylococcus aureus: NEGATIVE

## 2013-09-26 LAB — APTT: aPTT: 25 seconds (ref 24–37)

## 2013-09-26 MED ORDER — CHLORHEXIDINE GLUCONATE 4 % EX LIQD: 60.0000 mL | Freq: Once | CUTANEOUS | Status: DC

## 2013-09-26 NOTE — Progress Notes (Addendum)
Pt doesn't have a cardiologist  Echo/Stress test done 5+yrs ago prior to gastric bypass-done at [facility]   Denies ever having a heart cath  Denies EKG or CXR in past yr   Medical Md is Dr.Ed Luan Pulling in [place]

## 2013-09-27 NOTE — Progress Notes (Signed)
Anesthesia Chart Review:  Patient is a 73 year old female scheduled for revision of left THA on 10/03/13 by Dr. Mayer Camel.  History includes nonsmoker, postoperative nausea and vomiting, thyroidectomy with secondary hypothyroidism, hypertension, diverticulosis, peripheral edema treated with Lasix PRN, headaches, osteoporosis, anxiety, diverticulosis, history of what transfusion, hysterectomy, gastric bypass surgery > 5 years ago [facility]), cholecystectomy. BMI is 27. PCP is Dr. Sinda Du in [place].  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  She reported prior stress and echo prior to her gastric bypass > 5 years ago. No CV symptoms documented at her PAT visit.  Preoperative CXR and labs noted.    Patient with new right BBB since 2006, but no CV symptoms reported.  No known CAD/MI/CHF, smoking, or DM history.  Further evaluation by her assigned anesthesiologist on the day of surgery, but if she remains asymptomatic from a CV standpoint then I would anticipate that she could proceed as planned.  George Hugh [facility] Short Stay Center/Anesthesiology Phone [phone] 09/27/2013 1:16 PM

## 2013-10-02 MED ORDER — CEFAZOLIN SODIUM-DEXTROSE 2-3 GM-% IV SOLR
2.0000 g | INTRAVENOUS | Status: AC
  Administered 2013-10-03: 2 g via INTRAVENOUS
  Filled 2013-10-02: qty 50

## 2013-10-02 NOTE — H&P (Signed)
TOTAL HIP REVISION ADMISSION H&P  Patient is admitted for left revision total hip arthroplasty.  Subjective:  Chief Complaint: left hip pain  HPI: Kathy [name], 73 y.o. female, has a history of pain and functional disability in the left hip due to arthritis and patient has failed non-surgical conservative treatments for greater than 12 weeks to include NSAID's and/or analgesics, use of assistive devices and activity modification. The indications for the revision total hip arthroplasty are bearing surface wear leading to  symptomatic synovitis.  Onset of symptoms was gradual starting several years ago with gradually worsening course since that time.  Prior procedures on the left hip include arthroplasty.  Patient currently rates pain in the left hip at 10 out of 10 with activity.  There is worsening of pain with activity and weight bearing, pain that interfers with activities of daily living and pain with passive range of motion. Patient has evidence of subchondral cysts and poly wear by imaging studies.  This condition presents safety issues increasing the risk of falls.   There is no current active infection.  There are no active problems to display for this patient.  Past Medical History  Diagnosis Date  . Osteoporosis     takes Fosamax weekly  . Anxiety     takes Alprazolam daily as needed  . Hypertension     takes Amlodipine daily  . Hypothyroidism     takes Synthroid daily  . Peripheral edema     takes Lasix daily as needed  . PONV (postoperative nausea and vomiting)   . History of bronchitis     last time many yrs ago  . Headache(784.0)     occasionally  . Numbness     occasionally left arm at night  . Arthritis   . Joint pain   . Diverticulosis   . Nocturia   . History of blood transfusion     no abnormal reaction noted    Past Surgical History  Procedure Laterality Date  . Cholecystectomy    . Abdominal hysterectomy    . Total thyroidectomy    . Gastric bypass  surgery    . Joint replacement Bilateral     hip  . Colonoscopy    . Cataract surgery Bilateral     No prescriptions prior to admission   No Known Allergies  History  Substance Use Topics  . Smoking status: Never Smoker   . Smokeless tobacco: Not on file  . Alcohol Use: No    No family history on file.    Review of Systems  Constitutional: Negative.   HENT: Negative.   Eyes: Negative.   Respiratory: Negative.   Cardiovascular: Negative.   Gastrointestinal: Negative.   Genitourinary: Negative.   Musculoskeletal: Positive for joint pain.  Skin: Negative.   Neurological: Negative.   Psychiatric/Behavioral: Negative.     Objective:  Physical Exam  Constitutional: She is oriented to person, place, and time. She appears well-developed and well-nourished.  HENT:  Head: Normocephalic and atraumatic.  Eyes: Pupils are equal, round, and reactive to light.  Neck: Normal range of motion. Neck supple.  Cardiovascular: Intact distal pulses.   Respiratory: Effort normal.  Musculoskeletal: She exhibits tenderness.  Neurological: She is alert and oriented to person, place, and time.  Skin: Skin is warm and dry.  Psychiatric: She has a normal mood and affect. Her behavior is normal. Judgment and thought content normal.    Vital signs in last 24 hours:     Labs:  There is no height or weight on file to calculate BMI.  Imaging Review:  Plain radiographs demonstrate a large medial cyst about 2 x 2 centimeters in size and smaller superior cyst.  The polyethylene liner has 1 maybe 2 mm of polyethylene remaining superiorly.  CT scan does show an impressive medial cyst going into the pubis with some medial wall ostial lysis.  However peripherally the acetabulum looks to be intact and more importantly superiorly and posteriorly.  There appears to be an adequate amount of bone.  Anteriorly the bone is relatively thin.  Assessment/Plan:  End stage arthritis, left hip(s) with  failed previous arthroplasty.  The patient history, physical examination, clinical judgement of the provider and imaging studies are consistent with end stage degenerative joint disease of the left hip(s), previous total hip arthroplasty. Revision total hip arthroplasty is deemed medically necessary. The treatment options including medical management, injection therapy, arthroscopy and arthroplasty were discussed at length. The risks and benefits of total hip arthroplasty were presented and reviewed. The risks due to aseptic loosening, infection, stiffness, dislocation/subluxation,  thromboembolic complications and other imponderables were discussed.  The patient acknowledged the explanation, agreed to proceed with the plan and consent was signed. Patient is being admitted for inpatient treatment for surgery, pain control, PT, OT, prophylactic antibiotics, VTE prophylaxis, progressive ambulation and ADL's and discharge planning. The patient is planning to be discharged to skilled nursing facility

## 2013-10-03 ENCOUNTER — Encounter (HOSPITAL_COMMUNITY): Payer: Medicare HMO | Admitting: Vascular Surgery

## 2013-10-03 ENCOUNTER — Inpatient Hospital Stay (HOSPITAL_COMMUNITY): Payer: Medicare HMO | Admitting: Certified Registered Nurse Anesthetist

## 2013-10-03 ENCOUNTER — Inpatient Hospital Stay (HOSPITAL_COMMUNITY): Payer: Medicare HMO

## 2013-10-03 ENCOUNTER — Inpatient Hospital Stay (HOSPITAL_COMMUNITY)
Admission: RE | Admit: 2013-10-03 | Discharge: 2013-10-05 | DRG: 468 | Disposition: A | Discharge status: Skilled Nursing Facility | Payer: Medicare HMO | Source: Ambulatory Visit | Attending: Orthopedic Surgery | Admitting: Orthopedic Surgery

## 2013-10-03 ENCOUNTER — Encounter (HOSPITAL_COMMUNITY): Payer: Self-pay | Admitting: *Deleted

## 2013-10-03 ENCOUNTER — Encounter (HOSPITAL_COMMUNITY)
Admission: RE | Disposition: A | Discharge status: Skilled Nursing Facility | Payer: Self-pay | Source: Ambulatory Visit | Attending: Orthopedic Surgery

## 2013-10-03 DIAGNOSIS — Y838 Other surgical procedures as the cause of abnormal reaction of the patient, or of later complication, without mention of misadventure at the time of the procedure: Secondary | ICD-10-CM | POA: Diagnosis present

## 2013-10-03 DIAGNOSIS — Z9884 Bariatric surgery status: Secondary | ICD-10-CM

## 2013-10-03 DIAGNOSIS — F411 Generalized anxiety disorder: Secondary | ICD-10-CM | POA: Diagnosis present

## 2013-10-03 DIAGNOSIS — Z96649 Presence of unspecified artificial hip joint: Secondary | ICD-10-CM

## 2013-10-03 DIAGNOSIS — T84069A Wear of articular bearing surface of unspecified internal prosthetic joint, initial encounter: Principal | ICD-10-CM | POA: Diagnosis present

## 2013-10-03 DIAGNOSIS — R609 Edema, unspecified: Secondary | ICD-10-CM | POA: Diagnosis present

## 2013-10-03 DIAGNOSIS — E039 Hypothyroidism, unspecified: Secondary | ICD-10-CM | POA: Diagnosis present

## 2013-10-03 DIAGNOSIS — Y92009 Unspecified place in unspecified non-institutional (private) residence as the place of occurrence of the external cause: Secondary | ICD-10-CM

## 2013-10-03 DIAGNOSIS — I10 Essential (primary) hypertension: Secondary | ICD-10-CM | POA: Diagnosis present

## 2013-10-03 DIAGNOSIS — M810 Age-related osteoporosis without current pathological fracture: Secondary | ICD-10-CM | POA: Diagnosis present

## 2013-10-03 HISTORY — PX: REVISION TOTAL HIP ARTHROPLASTY: SHX766

## 2013-10-03 HISTORY — PX: TOTAL HIP REVISION: SHX763

## 2013-10-03 LAB — GRAM STAIN

## 2013-10-03 SURGERY — TOTAL HIP REVISION
Anesthesia: General | Site: Hip | Laterality: Left

## 2013-10-03 MED ORDER — ACETAMINOPHEN 650 MG RE SUPP: 650.0000 mg | Freq: Four times a day (QID) | RECTAL | Status: DC | PRN

## 2013-10-03 MED ORDER — ALPRAZOLAM 0.25 MG PO TABS
0.2500 mg | ORAL_TABLET | Freq: Every evening | ORAL | Status: DC | PRN
  Administered 2013-10-04: 0.25 mg via ORAL
  Filled 2013-10-03: qty 1

## 2013-10-03 MED ORDER — BISACODYL 5 MG PO TBEC: 5.0000 mg | DELAYED_RELEASE_TABLET | Freq: Every day | ORAL | Status: DC | PRN

## 2013-10-03 MED ORDER — MIDAZOLAM HCL 2 MG/2ML IJ SOLN
INTRAMUSCULAR | Status: AC
  Filled 2013-10-03: qty 2

## 2013-10-03 MED ORDER — NEOSTIGMINE METHYLSULFATE 1 MG/ML IJ SOLN
INTRAMUSCULAR | Status: AC
  Filled 2013-10-03: qty 10

## 2013-10-03 MED ORDER — METHOCARBAMOL 500 MG PO TABS
500.0000 mg | ORAL_TABLET | Freq: Four times a day (QID) | ORAL | Status: DC | PRN
  Administered 2013-10-03 – 2013-10-05 (×5): 500 mg via ORAL
  Filled 2013-10-03 (×5): qty 1

## 2013-10-03 MED ORDER — METOCLOPRAMIDE HCL 10 MG PO TABS: 5.0000 mg | ORAL_TABLET | Freq: Three times a day (TID) | ORAL | Status: DC | PRN

## 2013-10-03 MED ORDER — BUPIVACAINE-EPINEPHRINE (PF) 0.5% -1:200000 IJ SOLN
INTRAMUSCULAR | Status: AC
  Filled 2013-10-03: qty 10

## 2013-10-03 MED ORDER — OXYCODONE HCL 5 MG/5ML PO SOLN: 5.0000 mg | Freq: Once | ORAL | Status: AC | PRN

## 2013-10-03 MED ORDER — DIPHENHYDRAMINE HCL 12.5 MG/5ML PO ELIX: 12.5000 mg | ORAL_SOLUTION | ORAL | Status: DC | PRN

## 2013-10-03 MED ORDER — ASPIRIN EC 325 MG PO TBEC
325.0000 mg | DELAYED_RELEASE_TABLET | Freq: Every day | ORAL | Status: DC
  Administered 2013-10-04 – 2013-10-05 (×2): 325 mg via ORAL
  Filled 2013-10-03 (×3): qty 1

## 2013-10-03 MED ORDER — OXYCODONE HCL 5 MG PO TABS
5.0000 mg | ORAL_TABLET | Freq: Once | ORAL | Status: AC | PRN
  Administered 2013-10-03: 5 mg via ORAL

## 2013-10-03 MED ORDER — FENTANYL CITRATE 0.05 MG/ML IJ SOLN
INTRAMUSCULAR | Status: AC
  Filled 2013-10-03: qty 5

## 2013-10-03 MED ORDER — GLYCOPYRROLATE 0.2 MG/ML IJ SOLN
INTRAMUSCULAR | Status: AC
  Filled 2013-10-03: qty 1

## 2013-10-03 MED ORDER — LIDOCAINE HCL (CARDIAC) 20 MG/ML IV SOLN
INTRAVENOUS | Status: AC
  Filled 2013-10-03: qty 5

## 2013-10-03 MED ORDER — PROMETHAZINE HCL 25 MG/ML IJ SOLN: 6.2500 mg | INTRAMUSCULAR | Status: DC | PRN

## 2013-10-03 MED ORDER — DOCUSATE SODIUM 100 MG PO CAPS
100.0000 mg | ORAL_CAPSULE | Freq: Two times a day (BID) | ORAL | Status: DC
  Administered 2013-10-03 – 2013-10-05 (×4): 100 mg via ORAL
  Filled 2013-10-03 (×4): qty 1

## 2013-10-03 MED ORDER — MIDAZOLAM HCL 5 MG/5ML IJ SOLN
INTRAMUSCULAR | Status: DC | PRN
  Administered 2013-10-03: 2 mg via INTRAVENOUS

## 2013-10-03 MED ORDER — PHENOL 1.4 % MT LIQD: 1.0000 | OROMUCOSAL | Status: DC | PRN

## 2013-10-03 MED ORDER — PROPOFOL 10 MG/ML IV BOLUS
INTRAVENOUS | Status: DC | PRN
  Administered 2013-10-03: 150 mg via INTRAVENOUS

## 2013-10-03 MED ORDER — ONDANSETRON HCL 4 MG/2ML IJ SOLN
INTRAMUSCULAR | Status: DC | PRN
  Administered 2013-10-03 (×2): 4 mg via INTRAVENOUS

## 2013-10-03 MED ORDER — ONDANSETRON HCL 4 MG/2ML IJ SOLN
INTRAMUSCULAR | Status: AC
  Filled 2013-10-03: qty 2

## 2013-10-03 MED ORDER — HYDROMORPHONE HCL PF 1 MG/ML IJ SOLN: 1.0000 mg | INTRAMUSCULAR | Status: DC | PRN

## 2013-10-03 MED ORDER — ONDANSETRON HCL 4 MG PO TABS: 4.0000 mg | ORAL_TABLET | Freq: Four times a day (QID) | ORAL | Status: DC | PRN

## 2013-10-03 MED ORDER — KCL IN DEXTROSE-NACL 20-5-0.45 MEQ/L-%-% IV SOLN
INTRAVENOUS | Status: DC
  Administered 2013-10-03: 125 mL via INTRAVENOUS
  Filled 2013-10-03 (×9): qty 1000

## 2013-10-03 MED ORDER — MENTHOL 3 MG MT LOZG: 1.0000 | LOZENGE | OROMUCOSAL | Status: DC | PRN

## 2013-10-03 MED ORDER — GLYCOPYRROLATE 0.2 MG/ML IJ SOLN
INTRAMUSCULAR | Status: AC
  Filled 2013-10-03: qty 2

## 2013-10-03 MED ORDER — LEVOTHYROXINE SODIUM 112 MCG PO TABS
112.0000 ug | ORAL_TABLET | Freq: Every day | ORAL | Status: DC
  Administered 2013-10-04 – 2013-10-05 (×2): 112 ug via ORAL
  Filled 2013-10-03 (×3): qty 1

## 2013-10-03 MED ORDER — HYDROMORPHONE HCL PF 1 MG/ML IJ SOLN
INTRAMUSCULAR | Status: AC
  Filled 2013-10-03: qty 1

## 2013-10-03 MED ORDER — FENTANYL CITRATE 0.05 MG/ML IJ SOLN
INTRAMUSCULAR | Status: DC | PRN
  Administered 2013-10-03 (×4): 50 ug via INTRAVENOUS

## 2013-10-03 MED ORDER — FUROSEMIDE 40 MG PO TABS
40.0000 mg | ORAL_TABLET | Freq: Every day | ORAL | Status: DC | PRN
  Filled 2013-10-03: qty 1

## 2013-10-03 MED ORDER — OXYCODONE HCL 5 MG PO TABS
5.0000 mg | ORAL_TABLET | ORAL | Status: DC | PRN
  Administered 2013-10-03 – 2013-10-04 (×2): 10 mg via ORAL
  Administered 2013-10-04 – 2013-10-05 (×5): 5 mg via ORAL
  Filled 2013-10-03: qty 1
  Filled 2013-10-03 (×2): qty 2
  Filled 2013-10-03 (×2): qty 1
  Filled 2013-10-03: qty 2
  Filled 2013-10-03: qty 1

## 2013-10-03 MED ORDER — HYDROMORPHONE HCL PF 1 MG/ML IJ SOLN
0.2500 mg | INTRAMUSCULAR | Status: DC | PRN
  Administered 2013-10-03 (×2): 0.5 mg via INTRAVENOUS

## 2013-10-03 MED ORDER — SENNOSIDES-DOCUSATE SODIUM 8.6-50 MG PO TABS: 1.0000 | ORAL_TABLET | Freq: Every evening | ORAL | Status: DC | PRN

## 2013-10-03 MED ORDER — ROCURONIUM BROMIDE 100 MG/10ML IV SOLN
INTRAVENOUS | Status: DC | PRN
  Administered 2013-10-03: 40 mg via INTRAVENOUS
  Administered 2013-10-03: 10 mg via INTRAVENOUS

## 2013-10-03 MED ORDER — PHENYLEPHRINE 40 MCG/ML (10ML) SYRINGE FOR IV PUSH (FOR BLOOD PRESSURE SUPPORT)
PREFILLED_SYRINGE | INTRAVENOUS | Status: AC
  Filled 2013-10-03: qty 10

## 2013-10-03 MED ORDER — PHENYLEPHRINE HCL 10 MG/ML IJ SOLN
INTRAMUSCULAR | Status: DC | PRN
  Administered 2013-10-03: 80 ug via INTRAVENOUS

## 2013-10-03 MED ORDER — PROPOFOL 10 MG/ML IV BOLUS
INTRAVENOUS | Status: AC
  Filled 2013-10-03: qty 20

## 2013-10-03 MED ORDER — OXYCODONE-ACETAMINOPHEN 5-325 MG PO TABS: 1.0000 | ORAL_TABLET | ORAL | Status: DC | PRN

## 2013-10-03 MED ORDER — ASPIRIN EC 325 MG PO TBEC: 325.0000 mg | DELAYED_RELEASE_TABLET | Freq: Two times a day (BID) | ORAL | Status: DC

## 2013-10-03 MED ORDER — METHOCARBAMOL 100 MG/ML IJ SOLN
500.0000 mg | Freq: Four times a day (QID) | INTRAVENOUS | Status: DC | PRN
  Filled 2013-10-03: qty 5

## 2013-10-03 MED ORDER — 0.9 % SODIUM CHLORIDE (POUR BTL) OPTIME
TOPICAL | Status: DC | PRN
  Administered 2013-10-03: 1000 mL

## 2013-10-03 MED ORDER — LIDOCAINE HCL (CARDIAC) 20 MG/ML IV SOLN
INTRAVENOUS | Status: DC | PRN
  Administered 2013-10-03: 70 mg via INTRAVENOUS

## 2013-10-03 MED ORDER — DEXTROSE-NACL 5-0.45 % IV SOLN: INTRAVENOUS | Status: DC

## 2013-10-03 MED ORDER — METHOCARBAMOL 500 MG PO TABS: 500.0000 mg | ORAL_TABLET | Freq: Two times a day (BID) | ORAL | Status: DC

## 2013-10-03 MED ORDER — AMLODIPINE BESYLATE 5 MG PO TABS
5.0000 mg | ORAL_TABLET | Freq: Every day | ORAL | Status: DC | PRN
  Filled 2013-10-03: qty 1

## 2013-10-03 MED ORDER — BUPIVACAINE-EPINEPHRINE 0.5% -1:200000 IJ SOLN
INTRAMUSCULAR | Status: DC | PRN
  Administered 2013-10-03: 10 mL

## 2013-10-03 MED ORDER — NEOSTIGMINE METHYLSULFATE 1 MG/ML IJ SOLN
INTRAMUSCULAR | Status: DC | PRN
  Administered 2013-10-03: 3 mg via INTRAVENOUS

## 2013-10-03 MED ORDER — ONDANSETRON HCL 4 MG/2ML IJ SOLN
4.0000 mg | Freq: Four times a day (QID) | INTRAMUSCULAR | Status: DC | PRN
  Administered 2013-10-03: 4 mg via INTRAVENOUS
  Filled 2013-10-03: qty 2

## 2013-10-03 MED ORDER — ROCURONIUM BROMIDE 50 MG/5ML IV SOLN
INTRAVENOUS | Status: AC
  Filled 2013-10-03: qty 1

## 2013-10-03 MED ORDER — OXYCODONE HCL 5 MG PO TABS
ORAL_TABLET | ORAL | Status: AC
  Filled 2013-10-03: qty 1

## 2013-10-03 MED ORDER — LACTATED RINGERS IV SOLN
INTRAVENOUS | Status: DC
Start: 2013-10-03 — End: 2013-10-03
  Administered 2013-10-03: 50 mL/h via INTRAVENOUS
  Administered 2013-10-03: 13:00:00 via INTRAVENOUS

## 2013-10-03 MED ORDER — ACETAMINOPHEN 325 MG PO TABS
650.0000 mg | ORAL_TABLET | Freq: Four times a day (QID) | ORAL | Status: DC | PRN
  Administered 2013-10-04: 650 mg via ORAL
  Filled 2013-10-03: qty 2

## 2013-10-03 MED ORDER — METOCLOPRAMIDE HCL 5 MG/ML IJ SOLN: 5.0000 mg | Freq: Three times a day (TID) | INTRAMUSCULAR | Status: DC | PRN

## 2013-10-03 MED ORDER — GLYCOPYRROLATE 0.2 MG/ML IJ SOLN
INTRAMUSCULAR | Status: DC | PRN
  Administered 2013-10-03: .4 mg via INTRAVENOUS

## 2013-10-03 MED ORDER — DEXAMETHASONE SODIUM PHOSPHATE 4 MG/ML IJ SOLN
INTRAMUSCULAR | Status: AC
  Filled 2013-10-03: qty 1

## 2013-10-03 MED ORDER — DEXAMETHASONE SODIUM PHOSPHATE 4 MG/ML IJ SOLN
INTRAMUSCULAR | Status: DC | PRN
  Administered 2013-10-03: 4 mg via INTRAVENOUS

## 2013-10-03 MED ORDER — KCL IN DEXTROSE-NACL 20-5-0.45 MEQ/L-%-% IV SOLN
INTRAVENOUS | Status: AC
  Filled 2013-10-03: qty 1000

## 2013-10-03 MED ORDER — METHOCARBAMOL 500 MG PO TABS
ORAL_TABLET | ORAL | Status: AC
  Filled 2013-10-03: qty 1

## 2013-10-03 SURGICAL SUPPLY — 69 items
BONE CANC CHIPS 20CC PCAN1/4 (Bone Implant) ×2 IMPLANT
BOWL SMART MIX CTS (DISPOSABLE) IMPLANT
BRUSH FEMORAL CANAL (MISCELLANEOUS) IMPLANT
CHIPS CANC BONE 20CC PCAN1/4 (Bone Implant) ×1 IMPLANT
COVER SURGICAL LIGHT HANDLE (MISCELLANEOUS) ×2 IMPLANT
DRAPE C-ARM 42X72 X-RAY (DRAPES) IMPLANT
DRAPE ORTHO SPLIT 77X108 STRL (DRAPES) ×2
DRAPE PROXIMA HALF (DRAPES) ×2 IMPLANT
DRAPE SURG ORHT 6 SPLT 77X108 (DRAPES) ×1 IMPLANT
DRAPE U-SHAPE 47X51 STRL (DRAPES) ×2 IMPLANT
DRILL BIT 7/64X5 (BIT) ×2 IMPLANT
DRSG AQUACEL AG ADV 3.5X10 (GAUZE/BANDAGES/DRESSINGS) ×1 IMPLANT
DRSG MEPILEX BORDER 4X8 (GAUZE/BANDAGES/DRESSINGS) ×2 IMPLANT
DURAPREP 26ML APPLICATOR (WOUND CARE) ×2 IMPLANT
ELECT BLADE 4.0 EZ CLEAN MEGAD (MISCELLANEOUS)
ELECT BLADE 6.5 EXT (BLADE) IMPLANT
ELECT REM PT RETURN 9FT ADLT (ELECTROSURGICAL) ×2
ELECTRODE BLDE 4.0 EZ CLN MEGD (MISCELLANEOUS) IMPLANT
ELECTRODE REM PT RTRN 9FT ADLT (ELECTROSURGICAL) ×1 IMPLANT
ELIMINATOR HOLE APEX DEPUY (Hips) ×1 IMPLANT
EVACUATOR 1/8 PVC DRAIN (DRAIN) IMPLANT
GAUZE XEROFORM 5X9 LF (GAUZE/BANDAGES/DRESSINGS) ×2 IMPLANT
GLOVE BIO SURGEON STRL SZ7.5 (GLOVE) ×2 IMPLANT
GLOVE BIO SURGEON STRL SZ8.5 (GLOVE) ×4 IMPLANT
GLOVE BIOGEL PI IND STRL 8 (GLOVE) ×2 IMPLANT
GLOVE BIOGEL PI IND STRL 9 (GLOVE) ×1 IMPLANT
GLOVE BIOGEL PI INDICATOR 8 (GLOVE) ×2
GLOVE BIOGEL PI INDICATOR 9 (GLOVE) ×1
GOWN PREVENTION PLUS XLARGE (GOWN DISPOSABLE) ×6 IMPLANT
GOWN STRL NON-REIN LRG LVL3 (GOWN DISPOSABLE) ×4 IMPLANT
GOWN STRL REIN XL XLG (GOWN DISPOSABLE) ×4 IMPLANT
GRAFT BNE CANC CHIPS 1-8 20CC (Bone Implant) IMPLANT
HANDPIECE INTERPULSE COAX TIP (DISPOSABLE)
HEAD FEM STD 32X+1 STRL (Hips) ×1 IMPLANT
HEEL PROTECTOR  874200 (MISCELLANEOUS)
HEEL PROTECTOR 874200 (MISCELLANEOUS) IMPLANT
HOOD PEEL AWAY FACE SHEILD DIS (HOOD) ×4 IMPLANT
KIT BASIN OR (CUSTOM PROCEDURE TRAY) ×2 IMPLANT
KIT ROOM TURNOVER OR (KITS) ×2 IMPLANT
LINER MARATHEN 10D 1DX52 OD (Liner) ×2 IMPLANT
LINER MARATHON 10D 1DX52 OD (Liner) IMPLANT
MANIFOLD NEPTUNE II (INSTRUMENTS) ×2 IMPLANT
NEEDLE 22X1 1/2 (OR ONLY) (NEEDLE) ×2 IMPLANT
NOZZLE PRISM 8.5MM (MISCELLANEOUS) IMPLANT
NS IRRIG 1000ML POUR BTL (IV SOLUTION) ×4 IMPLANT
PACK TOTAL JOINT (CUSTOM PROCEDURE TRAY) ×2 IMPLANT
PAD ARMBOARD 7.5X6 YLW CONV (MISCELLANEOUS) ×4 IMPLANT
PASSER SUT SWANSON 36MM LOOP (INSTRUMENTS) IMPLANT
PRESSURIZER FEMORAL UNIV (MISCELLANEOUS) IMPLANT
RING LOCK ACET OD 52 (Hips) ×1 IMPLANT
SET HNDPC FAN SPRY TIP SCT (DISPOSABLE) IMPLANT
SLEEVE SURGEON STRL (DRAPES) IMPLANT
SPONGE GAUZE 4X4 12PLY (GAUZE/BANDAGES/DRESSINGS) ×2 IMPLANT
SPONGE LAP 18X18 X RAY DECT (DISPOSABLE) IMPLANT
STAPLER VISISTAT 35W (STAPLE) ×2 IMPLANT
SUT ETHIBOND 2 V 37 (SUTURE) ×2 IMPLANT
SUT ETHILON 3 0 FSL (SUTURE) ×2 IMPLANT
SUT VIC AB 0 CTB1 27 (SUTURE) ×2 IMPLANT
SUT VIC AB 1 CTX 36 (SUTURE) ×2
SUT VIC AB 1 CTX36XBRD ANBCTR (SUTURE) ×1 IMPLANT
SUT VIC AB 2-0 CTB1 (SUTURE) ×2 IMPLANT
SYR 20ML ECCENTRIC (SYRINGE) ×2 IMPLANT
SYR CONTROL 10ML LL (SYRINGE) ×2 IMPLANT
TOWEL OR 17X24 6PK STRL BLUE (TOWEL DISPOSABLE) ×2 IMPLANT
TOWEL OR 17X26 10 PK STRL BLUE (TOWEL DISPOSABLE) ×2 IMPLANT
TOWER CARTRIDGE SMART MIX (DISPOSABLE) IMPLANT
TRAY FOLEY CATH 14FR (SET/KITS/TRAYS/PACK) IMPLANT
TUBE ANAEROBIC SPECIMEN COL (MISCELLANEOUS) ×2 IMPLANT
WATER STERILE IRR 1000ML POUR (IV SOLUTION) ×6 IMPLANT

## 2013-10-03 NOTE — Interval H&P Note (Signed)
History and Physical Interval Note:  10/03/2013 12:13 PM  Kathy [name]  has presented today for surgery, with the diagnosis of LOOSE LEFT TOTAL HIP ARTHROPLASTY ACETABULAR  The various methods of treatment have been discussed with the patient and family. After consideration of risks, benefits and other options for treatment, the patient has consented to  Procedure(s): TOTAL HIP REVISION (Left) as a surgical intervention .  The patient's history has been reviewed, patient examined, no change in status, stable for surgery.  I have reviewed the patient's chart and labs.  Questions were answered to the patient's satisfaction.     Kerin Salen

## 2013-10-03 NOTE — Plan of Care (Signed)
Problem: Consults Goal: Diagnosis- Total Joint Replacement Revision Total Hip left

## 2013-10-03 NOTE — Op Note (Addendum)
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  Postoperative diagnosis: Same  Procedure: Revision leftt total hip arthroplasty with removal of worn DePuy Duraloc polyethylene liner and 28 mm femoral head and revision to a new 52 mm polyethylene liner to except a 36 mm +1.5 femoral head  index posterior and superior , we pack crushed cancellus bone graft and the inferior screw holes in the apex hole or ostial lysis that occurred  Surgeon: Kathalene Frames. Mayer Camel M.D.  Assistant: Kerry Hough. Barton Dubois  (present throughout entire procedure and necessary for timely completion of the procedure)  Estimated blood loss: 400 cc  Fluid replacement: 1300 cc of crystalloid  Complications: None  Indications: Patient with an DePuy total hip using an AML stem, Duraloc cup, and polyethylene liner using a +1.5 mm 28 mm metal head that did very well until a few years ago when she had increasing groin pain. The pain wakes her up at night and recently got to the point where she was limping and developing left-sided weakness.. Plain x-rays show 5 mm of wear in the polyethylene liner with superior migration and CT scan showed ostial lysis inferiorly and medially to the cup which did appear to be stable. and the stem appears to be well ingrown. Patient denies any fevers or chills or wound problems. Primary plan is to revise the liner to a new cross-link polyethylene liner with a larger metal head for stability, plan B. would be to remove the metal shell of his unstable and revise the shell and polyethylene liner and femoral metal head component. Risks and benefits of revision surgery have been discussed and questions answered.  Procedure: Patient was identified by arm band receive preoperative IV antibiotics in the holding area at, and hospital. He was then taken to the operating room where the appropriate anesthetic monitors  were attached and general endotracheal anesthesia induced with the patient in the supine position. She was then rolled into the right lateral decubitus position and fixed there with a mark 2 pelvic clamp. A Foley catheter was inserted and the limb prepped and draped in usual sterile fashion from the ankle to the hemipelvis. Time out procedure performed. Skin along the lateral hip and thigh infiltrated with 10 cc of 1/2% Marcaine and epinephrine solution. We began the operation by recreating the old posterior lateral incision 15 cm in line through the skin and subcutaneous tissue down to the level of the IT band which was cut in line with the skin incision. This exposed the greater trochanter and the pseudocapsule was peeled off the back of the trochanter and tagged with 2 #2 Ethibond sutures appear. We then remove scar tissue from around to the dural a cup and femoral stem trunnion, dislocated a total hip and removed the 28 mm metal head with a mallet and metal cylinder. The trunnion was then tucked anterior and superior to the acetabulum and we continued to remove scar tissue from around the acetabular component and polyethylene liner. Once the area had been cleared we removed the worn polyethylene liner with the Depew extraction tool, and remove fibrous tissue from the sector holes which were actually inferior really ostial lysis was. We removed the apex hole in the nadir and applied to handle to the cup which was found to be stable using the handle as a lever appear. We also removed the Duraloc locking ring. As well as fibrous tissue from the  inferior screw holes. We then packed crushed cancellus allograft into the screw holes in the apex hole and irrigated with normal saline solution. A new polyethylene liner to except a 36 mm head was placed after placing a new locking ring with the index posterior superior and a new 1.5 mm by 36 mm femoral head was placed. Instability was noted to 90 of flexion 70 of internal  rotation and in full extension the hip could not be dislocated with external rotation. The hip reduced and irrigated with normal saline solution. The capsular flap was repaired back to the intertrochanteric crest through drill holes with #2 Ethibond suture. We then closed the IT band with running #1 Vicryl suture, the subcutaneous tissue with 0 and 2-0 undyed Vicryl suture, the skin was closed with running interlocking 3-0 nylon suture. A dressing of Mepilex was then applied, the patient was unclamped a rolled supine awakened extubated and taken to the recovery without difficulty.

## 2013-10-03 NOTE — Discharge Instructions (Signed)
Total Hip Replacement °Care After °Refer to this sheet in the next few weeks. These instructions provide you with information on caring for yourself after your procedure. Your caregiver also may give you specific instructions. Your treatment has been planned according to the most current medical practices, but problems sometimes occur. Call your caregiver if you have any problems or questions after your procedure. °HOME CARE INSTRUCTIONS  °Your caregiver will give you specific precautions for certain types of movement. Additional instructions include: °· Take over-the-counter or prescription medicines for pain, discomfort, or fever only as directed by your caregiver. °· Take quick showers (3 5 min) rather than bathe until your caregiver tells you that you can take baths again. °· Avoid lifting until your caregiver instructs you otherwise. °· Use a raised toilet seat and avoid sitting in low chairs as instructed by your caregiver. °· Use crutches or a walker as instructed by your caregiver. °SEEK MEDICAL CARE IF: °· You have difficulty breathing. °· Your wound is red, swollen, or has become increasingly painful. °· You have pus draining from your wound. °· You have a bad smell coming from your wound. °· You have persistent bleeding from your wound. °· Your wound breaks open after sutures (stitches) or staples have been removed. °SEEK IMMEDIATE MEDICAL CARE IF:  °· You have a fever. °· You have a rash. °· You have pain or swelling in your calf or thigh. °· You have shortness of breath or chest pain. °MAKE SURE YOU: °· Understand these instructions. °· Will watch your condition. °· Will get help right away if you are not doing well or get worse. °Document Released: 02/05/2005 Document Revised: 01/18/2012 Document Reviewed: 09/05/2011 °ExitCare® Patient Information ©2014 [employer]. ° °

## 2013-10-03 NOTE — Progress Notes (Signed)
Utilization review completed.  

## 2013-10-03 NOTE — Anesthesia Procedure Notes (Signed)
Procedure Name: Intubation Date/Time: 10/03/2013 12:37 PM Performed by: Carola Frost Pre-anesthesia Checklist: Patient identified, Timeout performed, Emergency Drugs available, Suction available and Patient being monitored Patient Re-evaluated:Patient Re-evaluated prior to inductionOxygen Delivery Method: Circle system utilized and Ambu bag Preoxygenation: Pre-oxygenation with 100% oxygen Intubation Type: IV induction Ventilation: Mask ventilation without difficulty Laryngoscope Size: Mac and 3 Grade View: Grade I Tube type: Oral Tube size: 7.0 mm Number of attempts: 1 Airway Equipment and Method: Stylet Placement Confirmation: positive ETCO2,  ETT inserted through vocal cords under direct vision,  breath sounds checked- equal and bilateral and CO2 detector Secured at: 21 cm Tube secured with: Tape Dental Injury: Teeth and Oropharynx as per pre-operative assessment

## 2013-10-03 NOTE — Progress Notes (Signed)
Orthopedic Tech Progress Note Patient Details:  Kathy [name] 04-04-1941 [id number]  Ortho Devices Ortho Device/Splint Location: put ohf on bed   Braulio Bosch 10/03/2013, 9:23 PM

## 2013-10-03 NOTE — Anesthesia Postprocedure Evaluation (Signed)
Anesthesia Post Note  Patient: Kathy [name]  Procedure(s) Performed: Procedure(s) (LRB): TOTAL HIP REVISION- left (Left)  Anesthesia type: General  Patient location: PACU  Post pain: Pain level controlled and Adequate analgesia  Post assessment: Post-op Vital signs reviewed, Patient's Cardiovascular Status Stable, Respiratory Function Stable, Patent Airway and Pain level controlled  Last Vitals:  Filed Vitals:   10/03/13 1545  BP: 131/85  Pulse: 72  Temp: 36.9 C  Resp: 11    Post vital signs: Reviewed and stable  Level of consciousness: awake, alert  and oriented  Complications: No apparent anesthesia complications

## 2013-10-03 NOTE — Preoperative (Signed)
Beta Blockers   Reason not to administer Beta Blockers:Not Applicable 

## 2013-10-03 NOTE — Transfer of Care (Signed)
Immediate Anesthesia Transfer of Care Note  Patient: Kathy [name]  Procedure(s) Performed: Procedure(s): TOTAL HIP REVISION- left (Left)  Patient Location: PACU  Anesthesia Type:General  Level of Consciousness: awake, alert , oriented and patient cooperative  Airway & Oxygen Therapy: Patient Spontanous Breathing and Patient connected to nasal cannula oxygen  Post-op Assessment: Report given to PACU RN, Post -op Vital signs reviewed and stable and Patient moving all extremities  Post vital signs: Reviewed and stable  Complications: No apparent anesthesia complications

## 2013-10-03 NOTE — Anesthesia Preprocedure Evaluation (Addendum)
Anesthesia Evaluation  Patient identified by MRN, date of birth, ID band Patient awake    Reviewed: Allergy & Precautions, H&P , NPO status , Patient's Chart, lab work & pertinent test results  History of Anesthesia Complications (+) PONV and history of anesthetic complications  Airway Mallampati: II TM Distance: >3 FB Neck ROM: Full    Dental  (+) Teeth Intact, Dental Advisory Given, Implants   Pulmonary neg pulmonary ROS,    Pulmonary exam normal       Cardiovascular hypertension, Pt. on medications     Neuro/Psych  Headaches, Anxiety    GI/Hepatic negative GI ROS, Neg liver ROS,   Endo/Other  Hypothyroidism   Renal/GU negative Renal ROS     Musculoskeletal   Abdominal   Peds  Hematology   Anesthesia Other Findings   Reproductive/Obstetrics                          Anesthesia Physical Anesthesia Plan  ASA: III  Anesthesia Plan: General   Post-op Pain Management:    Induction: Intravenous  Airway Management Planned: Oral ETT  Additional Equipment:   Intra-op Plan:   Post-operative Plan: Extubation in OR  Informed Consent: I have reviewed the patients History and Physical, chart, labs and discussed the procedure including the risks, benefits and alternatives for the proposed anesthesia with the patient or authorized representative who has indicated his/her understanding and acceptance.   Dental advisory given  Plan Discussed with: CRNA, Anesthesiologist and Surgeon  Anesthesia Plan Comments:        Anesthesia Quick Evaluation

## 2013-10-04 ENCOUNTER — Encounter (HOSPITAL_COMMUNITY): Payer: Self-pay | Admitting: General Practice

## 2013-10-04 LAB — BASIC METABOLIC PANEL
BUN: 6 mg/dL (ref 6–23)
CO2: 27 mEq/L (ref 19–32)
Calcium: 8.5 mg/dL (ref 8.4–10.5)
Chloride: 104 mEq/L (ref 96–112)
Creatinine, Ser: 0.84 mg/dL (ref 0.50–1.10)
GFR calc Af Amer: 79 mL/min — ABNORMAL LOW (ref >90)
GFR calc non Af Amer: 68 mL/min — ABNORMAL LOW (ref >90)
Glucose, Bld: 103 mg/dL — ABNORMAL HIGH (ref 70–99)
Potassium: 4.9 mEq/L (ref 3.7–5.3)
Sodium: 140 mEq/L (ref 137–147)

## 2013-10-04 LAB — CBC
HCT: 26.6 % — ABNORMAL LOW (ref 36.0–46.0)
Hemoglobin: 9.1 g/dL — ABNORMAL LOW (ref 12.0–15.0)
MCH: 31.5 pg (ref 26.0–34.0)
MCHC: 34.2 g/dL (ref 30.0–36.0)
MCV: 92 fL (ref 78.0–100.0)
Platelets: 189 10*3/uL (ref 150–400)
RBC: 2.89 MIL/uL — ABNORMAL LOW (ref 3.87–5.11)
RDW: 13.7 % (ref 11.5–15.5)
WBC: 5.5 10*3/uL (ref 4.0–10.5)

## 2013-10-04 NOTE — Clinical Social Work Psychosocial (Signed)
Clinical Social Work Department  BRIEF PSYCHOSOCIAL ASSESSMENT  Patient: Kathy [name]  Account Number: [id number]  Admit date: 10/03/13 Clinical Social Worker Rhea Pink, MSW Date/Time: 10/04/2013 3:30 PM Referred by: Physician Date Referred: 10/03/13 Referred for   SNF Placement   Other Referral:  Interview type: Patient  Other interview type: PSYCHOSOCIAL DATA  Living Status: Alone Admitted from facility:  Level of care:   Primary support name: Kathy [name] Primary support relationship to patient: Brother Degree of support available:  Fair  CURRENT CONCERNS  Current Concerns   Post-Acute Placement   Other Concerns:  SOCIAL WORK ASSESSMENT / PLAN  CSW met with pt re: PT recommendation for SNF.   Pt lives alone and has no-one that can help her post-operatively  CSW explained placement process and answered questions.   Pt reports [facility]  as her preference - patient pre-registered   CSW completed FL2 and initiated SNF search.     Assessment/plan status: Information/Referral to [employer]  Other assessment/ plan:  Information/referral to community resources:  SNF     PATIENT'S/FAMILY'S RESPONSE TO PLAN OF CARE:  Pt  reports she is agreeable to ST SNF in order to increase strength and independence with mobility prior to returning home. Looking at the PT note, CSW mediated expectations by telling the patient that [facility] may deny her to go to SNF, Patient was anxious about the possible denial but understood. CSW provided empathic listening and support.   Pt verbalized understanding of placement process and appreciation for CSW assist and support  Rhea Pink, MSW, [place]

## 2013-10-04 NOTE — Progress Notes (Signed)
OT Cancellation Note  Patient Details Name: Kathy [name] MRN: [id number] DOB: 1941/03/27   Cancelled Treatment:    Reason Eval/Treat Not Completed: OT screened, will sign off (all OT needs can be met in next venue of care -- SNF)  Daizy Outen A 10/04/2013, 3:24 PM

## 2013-10-04 NOTE — Progress Notes (Signed)
Pt BP rechecked. BP 97/50 P 95. Pt is asymptomatic when up. Pt states that her BP usually runs "100/60". Will continue to monitor.

## 2013-10-04 NOTE — Progress Notes (Signed)
Patient ID: Kathy [name], female   DOB: 1941-07-22, 73 y.o.   MRN: [id number] PATIENT ID: Kathy [name]  MRN: [id number]  DOB/AGE:  July 29, 1941 / 73 y.o.  1 Day Post-Op Procedure(s) (LRB): TOTAL HIP REVISION- left (Left)    PROGRESS NOTE Subjective: Patient is alert, oriented,no Nausea, no Vomiting, yes passing gas, no Bowel Movement. Taking PO well. Denies SOB, Chest or Calf Pain. Using Incentive Spirometer, PAS in place. Ambulate Weightbearing as tolerated.  Today Patient reports pain as 3 on 0-10 scale  .    Objective: Vital signs in last 24 hours: Filed Vitals:   10/04/13 0107 10/04/13 0532 10/04/13 0535 10/04/13 0649  BP: 107/67 88/46 90/50  92/50  Pulse: 85 65    Temp: 100.4 F (38 C) 98.2 F (36.8 C)    TempSrc:      Resp: 18 16    SpO2: 99% 99%        Intake/Output from previous day: I/O last 3 completed shifts: In: 3011.7 [P.O.:420; I.V.:2591.7] Out: 500 [Urine:250; Blood:250]   Intake/Output this shift: Total I/O In: 240 [P.O.:240] Out: -    LABORATORY DATA:  Recent Labs  10/04/13 0430  WBC 5.5  HGB 9.1*  HCT 26.6*  PLT 189  NA 140  K 4.9  CL 104  CO2 27  BUN 6  CREATININE 0.84  GLUCOSE 103*  CALCIUM 8.5    Examination: Neurologically intact ABD soft Neurovascular intact Sensation intact distally Intact pulses distally Dorsiflexion/Plantar flexion intact Incision: scant drainage No cellulitis present Compartment soft} XR AP&Lat of hip shows well placed\fixed THA  Assessment:   1 Day Post-Op Procedure(s) (LRB): TOTAL HIP REVISION- left (Left) ADDITIONAL DIAGNOSIS:    Plan: PT/OT WBAT, THA  posterior precautions  DVT Prophylaxis: SCDx72 hrs, ASA 325 mg BID x 2 weeks  DISCHARGE PLAN: Skilled Nursing Facility/Rehab,Patient would like to go to [facility]  DISCHARGE NEEDS: HHPT, HHRN, CPM, Walker and 3-in-1 comode seat

## 2013-10-04 NOTE — Care Management Note (Signed)
10/04/2013  Patient:  Kathy [name], Kathy [name]   Account Number:  [id number]  Date Initiated:  10/04/2013  Documentation initiated by:  Ricki Miller  Subjective/Objective Assessment:   73 yr old female s/p Left hip total hip revision     Action/Plan:   Patient is for shortterm rehab at [facility], wants [facility], social worker is aware.   Anticipated DC Date:  10/05/2013   Anticipated DC Plan:  SKILLED NURSING FACILITY  In-house referral  Clinical Social Worker      DC Planning Services  CM consult      [facility] Choice  NA   Choice offered to / List presented to:             Status of service:  Completed, signed off Medicare Important Message given?   (If response is "NO", the following Medicare IM given date fields will be blank) Date Medicare IM given:   Date Additional Medicare IM given:    Discharge Disposition:  [place]

## 2013-10-04 NOTE — Evaluation (Signed)
Occupational Therapy Evaluation Patient Details Name: Kathy [name] MRN: [id number] DOB: 08/15/1940 Today's Date: 10/04/2013 Time: 5681-2751 OT Time Calculation (min): 10 min  OT Assessment / Plan / Recommendation History of present illness pt presents with L THRevision.     Clinical Impression   This 73 yo female admitted and underwent above presents to acute OT with decreased balance, increased pain, hip precautions all affecting her ability to care for herself. Will benefit from acute OT with follow up at SNF.    OT Assessment  Patient needs continued OT Services    Follow Up Recommendations  SNF    Barriers to Discharge Decreased caregiver support    Equipment Recommendations  None recommended by OT       Frequency  Min 2X/week    Precautions / Restrictions Precautions Precautions: Posterior Hip;Fall Precaution Booklet Issued: Yes (comment) Restrictions Weight Bearing Restrictions: Yes LLE Weight Bearing: Weight bearing as tolerated   Pertinent Vitals/Pain 3/10 hip    ADL  Eating/Feeding: Independent Where Assessed - Eating/Feeding: Chair Grooming: Min guard;Wash/dry hands Where Assessed - Grooming: Unsupported standing Upper Body Bathing: Set up Where Assessed - Upper Body Bathing: Unsupported sitting Lower Body Bathing: Maximal assistance Where Assessed - Lower Body Bathing: Unsupported sit to stand Upper Body Dressing: Set up Where Assessed - Upper Body Dressing: Supported sitting Lower Body Dressing: Maximal assistance Where Assessed - Lower Body Dressing: Unsupported sit to stand Toilet Transfer: Min Psychiatric nurse Method: Sit to Loss adjuster, chartered: Raised toilet seat with arms (or 3-in-1 over toilet) Toileting - Clothing Manipulation and Hygiene: Min guard Where Assessed - Toileting Clothing Manipulation and Hygiene: Sit to stand from 3-in-1 or toilet Equipment Used: Rolling walker Transfers/Ambulation Related to ADLs: min guard A  with RW    OT Diagnosis: Generalized weakness;Acute pain  OT Problem List: Decreased strength;Decreased range of motion;Impaired balance (sitting and/or standing);Pain;Decreased knowledge of use of DME or AE;Decreased knowledge of precautions OT Treatment Interventions: Self-care/ADL training;Balance training;DME and/or AE instruction   OT Goals(Current goals can be found in the care plan section) Acute Rehab OT Goals Patient Stated Goal: Walk OT Goal Formulation: With patient Time For Goal Achievement: 10/11/13 Potential to Achieve Goals: Good  Visit Information  Last OT Received On: 10/04/13 Assistance Needed: +1 Reason Eval/Treat Not Completed: OT screened, no needs identified, will sign off (all OT needs can be met in next venue of care) History of Present Illness: pt presents with L THRevision.         Prior [place] expects to be discharged to:: Skilled nursing facility Living Arrangements: Alone Additional Comments: pt plans to D/C to [facility] Prior Function Level of Independence: Independent Communication Communication: No difficulties Dominant Hand: Right         Vision/Perception Vision - History Patient Visual Report: No change from baseline   Cognition  Cognition Arousal/Alertness: Awake/alert Behavior During Therapy: WFL for tasks assessed/performed Overall Cognitive Status: Within Functional Limits for tasks assessed    Extremity/Trunk Assessment Upper Extremity Assessment Upper Extremity Assessment: Overall WFL for tasks assessed     Mobility Bed Mobility General bed mobility comments: Pt up in bathroom upon arrival Transfers Overall transfer level: Needs assistance Equipment used: Rolling walker (2 wheeled) Transfers: Sit to/from Stand Sit to Stand: Min guard          End of Session OT - End of Session Equipment Utilized During Treatment: Rolling walker Activity Tolerance: Patient tolerated  treatment well Patient  left: in chair;with call bell/phone within reach       Almon Register [phone] 10/04/2013, 4:23 PM

## 2013-10-04 NOTE — Evaluation (Signed)
Physical Therapy Evaluation Patient Details Name: Kathy [name] MRN: [id number] DOB: Dec 25, 1940 Today's Date: 10/04/2013 Time: 2376-2831 PT Time Calculation (min): 33 min  PT Assessment / Plan / Recommendation History of Present Illness  pt presents with L THRevision.    Clinical Impression  Pt very motivated and plans to D/C to [facility] for continued rehab prior to returning to home.  Will continue to follow.      PT Assessment  Patient needs continued PT services    Follow Up Recommendations  SNF    Does the patient have the potential to tolerate intense rehabilitation      Barriers to Discharge        Equipment Recommendations  None recommended by PT    Recommendations for Other Services     Frequency 7X/week    Precautions / Restrictions Precautions Precautions: Posterior Hip;Fall Precaution Booklet Issued: Yes (comment) Restrictions Weight Bearing Restrictions: Yes LLE Weight Bearing: Weight bearing as tolerated   Pertinent Vitals/Pain 3/10 during ambulation.  Premedicated per pt.        Mobility  Transfers Overall transfer level: Needs assistance Equipment used: Rolling walker (2 wheeled) Transfers: Sit to/from Stand Sit to Stand: Min assist General transfer comment: cues for UE use and controlling descent to sitting.   Ambulation/Gait Ambulation/Gait assistance: Min guard Ambulation Distance (Feet): 100 Feet Assistive device: Rolling walker (2 wheeled) Gait Pattern/deviations: Step-through pattern;Decreased stride length;Decreased stance time - left;Decreased step length - right Gait velocity interpretation: Below normal speed for age/gender General Gait Details: cues for gait sequencing, hip precautions with turns.      Exercises Total Joint Exercises Ankle Circles/Pumps: AROM;Both;10 reps Quad Sets: AROM;Both;10 reps Short Arc Quad: AROM;Left;10 reps Hip ABduction/ADduction: AAROM;Left;10 reps Long Arc Quad: AROM;Left;10 reps   PT Diagnosis:  Abnormality of gait;Generalized weakness  PT Problem List: Decreased strength;Decreased activity tolerance;Decreased balance;Decreased mobility;Decreased coordination;Decreased knowledge of use of DME;Decreased knowledge of precautions PT Treatment Interventions: DME instruction;Gait training;Stair training;Functional mobility training;Therapeutic activities;Therapeutic exercise;Balance training;Patient/family education     PT Goals(Current goals can be found in the care plan section) Acute Rehab PT Goals Patient Stated Goal: Walk PT Goal Formulation: With patient Time For Goal Achievement: 10/18/13 Potential to Achieve Goals: Good  Visit Information  Last PT Received On: 10/04/13 Assistance Needed: +1 History of Present Illness: pt presents with L THRevision.         Prior [place] expects to be discharged to:: Skilled nursing facility Living Arrangements: Alone Additional Comments: pt plans to D/C to [facility] Prior Function Level of Independence: Independent Communication Communication: No difficulties    Cognition  Cognition Arousal/Alertness: Awake/alert Behavior During Therapy: WFL for tasks assessed/performed Overall Cognitive Status: Within Functional Limits for tasks assessed    Extremity/Trunk Assessment Upper Extremity Assessment Upper Extremity Assessment: Defer to OT evaluation Lower Extremity Assessment Lower Extremity Assessment: LLE deficits/detail LLE Deficits / Details: AROM WFL, Strength limited by post-op pain.   Cervical / Trunk Assessment Cervical / Trunk Assessment: Normal   Balance    End of Session PT - End of Session Equipment Utilized During Treatment: Gait belt Activity Tolerance: Patient tolerated treatment well Patient left: in chair;with call bell/phone within reach Nurse Communication: Mobility status  GP     Catarina Hartshorn, [place] 10/04/2013, 3:17 PM

## 2013-10-04 NOTE — Clinical Social Work Placement (Addendum)
Clinical Social Work Department  CLINICAL SOCIAL WORK PLACEMENT NOTE   Patient: Kathy [name]  Account Number: [id number]  Admit date: 10/03/13 Clinical Social Worker: Rhea Pink LCSWA Date/time: 10/04/2013 11:30 AM  Clinical Social Work is seeking post-discharge placement for this patient at the following level of care: [place] (*CSW will update this form in Epic as items are completed)  10/04/2013 Patient/family provided with [place] Department of Clinical Social Work's list of facilities offering this level of care within the geographic area requested by the patient (or if unable, by the patient's family).  10/04/2013 Patient/family informed of their freedom to choose among providers that offer the needed level of care, that participate in Medicare, Medicaid or managed care program needed by the patient, have an available bed and are willing to accept the patient.  10/04/2013 Patient/family informed of [facility]' ownership interest in [facility], as well as of the fact that they are under no obligation to receive care at this facility.  PASARR submitted to EDS on  10/05/2013 PASARR number received from EDS on 10/05/2013 FL2 transmitted to all facilities in geographic area requested by pt/family on 10/04/2013 FL2 transmitted to all facilities within larger geographic area on  Patient informed that his/her managed care company has contracts with or will negotiate with certain facilities, including the following:  Patient/family informed of bed offers received: 3.5.14 Patient chooses bed at [facility] Physician recommends and patient chooses bed at  Patient to be transferred to on 10/05/2013 Patient to be transferred to facility by [facility] The following physician request were entered in Epic:  Additional Comments:  Patient has Craig Staggers [phone]

## 2013-10-05 ENCOUNTER — Encounter: Payer: Self-pay | Admitting: *Deleted

## 2013-10-05 LAB — CBC
HCT: 23.2 % — ABNORMAL LOW (ref 36.0–46.0)
Hemoglobin: 8 g/dL — ABNORMAL LOW (ref 12.0–15.0)
MCH: 32 pg (ref 26.0–34.0)
MCHC: 34.5 g/dL (ref 30.0–36.0)
MCV: 92.8 fL (ref 78.0–100.0)
Platelets: 155 10*3/uL (ref 150–400)
RBC: 2.5 MIL/uL — ABNORMAL LOW (ref 3.87–5.11)
RDW: 14 % (ref 11.5–15.5)
WBC: 5.2 10*3/uL (ref 4.0–10.5)

## 2013-10-05 LAB — WOUND CULTURE: Culture: NO GROWTH

## 2013-10-05 NOTE — Progress Notes (Signed)
Clinical social worker assisted with patient discharge to skilled nursing facility, [facility].  CSW addressed all family questions and concerns. CSW copied chart and added all important documents. CSW also set up patient transportation with [facility]. Clinical Social Worker will sign off for now as social work intervention is no longer needed.   Johnryan Sao, MSW, LCSWA [phone] 

## 2013-10-05 NOTE — Discharge Summary (Signed)
Patient ID: Kathy [name] MRN: [id number] DOB/AGE: [birth year] 72 y.o.  Admit date: 10/03/2013 Discharge date: 10/05/2013  Admission Diagnoses:  Active Problems:   S/P revision of total hip   Discharge Diagnoses:  Same  Past Medical History  Diagnosis Date  . Osteoporosis     takes Fosamax weekly  . Anxiety     takes Alprazolam daily as needed  . Hypertension     takes Amlodipine daily  . Hypothyroidism     takes Synthroid daily  . Peripheral edema     takes Lasix daily as needed  . PONV (postoperative nausea and vomiting)   . History of bronchitis     last time many yrs ago  . Headache(784.0)     occasionally  . Numbness     occasionally left arm at night  . Arthritis   . Joint pain   . Diverticulosis   . Nocturia   . History of blood transfusion     no abnormal reaction noted    Surgeries: Procedure(s): TOTAL HIP REVISION- left on 10/03/2013   Consultants:    Discharged Condition: Improved  Hospital Course: Kathy [name] is an 73 y.o. female who was admitted 10/03/2013 for operative treatment of<principal problem not specified>. Patient has severe unremitting pain that affects sleep, daily activities, and work/hobbies. After pre-op clearance the patient was taken to the operating room on 10/03/2013 and underwent  Procedure(s): TOTAL HIP REVISION- left.    Patient was given perioperative antibiotics: Anti-infectives   Start     Dose/Rate Route Frequency Ordered Stop   10/03/13 0600  ceFAZolin (ANCEF) IVPB 2 g/50 mL premix     2 g 100 mL/hr over 30 Minutes Intravenous On call to O.R. 10/02/13 1414 10/03/13 1247       Patient was given sequential compression devices, early ambulation, and chemoprophylaxis to prevent DVT.  Patient benefited maximally from hospital stay and there were no complications.    Recent vital signs: Patient Vitals for the past 24 hrs:  BP Temp Pulse Resp SpO2 Height Weight  10/05/13 1322 80/47 mmHg 98.9 F (37.2 C) 74 20 86 % - -   10/05/13 1234 121/55 mmHg - 84 - - - -  10/05/13 1223 99/62 mmHg - 94 - - - -  10/05/13 1221 92/56 mmHg - 81 - - - -  10/05/13 1147 - - - 16 - - -  10/05/13 0800 - - - 16 - - -  10/05/13 0458 90/48 mmHg 98.6 F (37 C) 87 16 99 % - -  10/05/13 0400 - - - 20 - - -  10/05/13 0000 - - - 20 - 5\' 1"  (1.549 m) 65.318 kg (144 lb)  10/04/13 2103 89/53 mmHg 98.7 F (37.1 C) 88 18 100 % - -  10/04/13 2045 90/48 mmHg - - - - - -  10/04/13 2000 - - - 18 - - -     Recent laboratory studies:  Recent Labs  10/04/13 0430 10/05/13 0501  WBC 5.5 5.2  HGB 9.1* 8.0*  HCT 26.6* 23.2*  PLT 189 155  NA 140  --   K 4.9  --   CL 104  --   CO2 27  --   BUN 6  --   CREATININE 0.84  --   GLUCOSE 103*  --   CALCIUM 8.5  --      Discharge Medications:     Medication List         alendronate 70 MG  tablet  Commonly known as:  FOSAMAX  Take 70 mg by mouth every Wednesday. Take with a full glass of water on an empty stomach.     ALPRAZolam 0.25 MG tablet  Commonly known as:  XANAX  Take 0.25 mg by mouth at bedtime as needed for anxiety or sleep.     amLODipine 5 MG tablet  Commonly known as:  NORVASC  Take 5 mg by mouth daily as needed (when blood pressure gets high).     aspirin EC 325 MG tablet  Take 1 tablet (325 mg total) by mouth 2 (two) times daily.     calcium carbonate 1250 MG capsule  Take 1,250 mg by mouth 3 (three) times daily.     Cholecalciferol 1000 UNITS Tbdp  Take 1,000 Units by mouth 2 (two) times daily.     diclofenac sodium 1 % Gel  Commonly known as:  VOLTAREN  Apply 2 g topically 2 (two) times daily as needed (for pain in knees and shoulder).     Fish Oil 1000 MG Caps  Take 1,000 mg by mouth daily.     furosemide 40 MG tablet  Commonly known as:  LASIX  Take 40 mg by mouth daily as needed for fluid.     levothyroxine 112 MCG tablet  Commonly known as:  SYNTHROID, LEVOTHROID  Take 112 mcg by mouth daily before breakfast.     methocarbamol 500 MG tablet   Commonly known as:  ROBAXIN  Take 1 tablet (500 mg total) by mouth 2 (two) times daily with a meal.     multivitamin with minerals Tabs tablet  Take 1 tablet by mouth 2 (two) times daily.     oxyCODONE-acetaminophen 5-325 MG per tablet  Commonly known as:  ROXICET  Take 1 tablet by mouth every 4 (four) hours as needed.     traMADol 50 MG tablet  Commonly known as:  ULTRAM  Take 50 mg by mouth every 6 (six) hours as needed for moderate pain.     vitamin C 500 MG tablet  Commonly known as:  ASCORBIC ACID  Take 500 mg by mouth 2 (two) times daily.        Diagnostic Studies: Dg Chest 2 View  09/26/2013   CLINICAL DATA:  Preop for left hip revision  EXAM: CHEST  2 VIEW  COMPARISON:  03/30/2005  FINDINGS: Cardiomediastinal silhouette is stable. No acute infiltrate or pulmonary edema. Degenerative changes thoracic spine. Degenerative changes bilateral shoulders.  IMPRESSION: No active disease. Degenerative changes thoracic spine and bilateral shoulders.   Electronically Signed   By: Lahoma Crocker M.D.   On: 09/26/2013 11:33   Dg Pelvis Portable  10/03/2013   CLINICAL DATA:  73 year old female status post total hip revision. Initial encounter.  EXAM: PORTABLE PELVIS 1-2 VIEWS  COMPARISON:  Pelvis CT 08/22/2013.  FINDINGS: Revised left bipolar hip arthroplasty. Hardware appears intact and normally aligned on this AP view. Sequelae of right total hip arthroplasty appears stable.  IMPRESSION: Revised left hip arthroplasty with no adverse features identified.   Electronically Signed   By: Lars Pinks M.D.   On: 10/03/2013 16:22    Disposition:       Discharge Orders   Future Orders Complete By Expires   Call MD / Call 911  As directed    Comments:     If you experience chest pain or shortness of breath, CALL 911 and be transported to the hospital emergency room.  If you develope a fever  above 101 F, pus (white drainage) or increased drainage or redness at the wound, or calf pain, call your  surgeon's office.   Change dressing  As directed    Comments:     You may change your dressing on day 5, then change the dressing daily with sterile 4 x 4 inch gauze dressing and paper tape.  You may clean the incision with alcohol prior to redressing   Constipation Prevention  As directed    Comments:     Drink plenty of fluids.  Prune juice may be helpful.  You may use a stool softener, such as Colace (over the counter) 100 mg twice a day.  Use MiraLax (over the counter) for constipation as needed.   Diet - low sodium heart healthy  As directed    Discharge instructions  As directed    Comments:     Follow up in 2 weeks with Dr. Mayer Camel   Driving restrictions  As directed    Comments:     No driving for 2 weeks   Follow the hip precautions as taught in Physical Therapy  As directed    Increase activity slowly as tolerated  As directed    Patient may shower  As directed    Comments:     You may shower without a dressing once there is no drainage.  Do not wash over the wound.  If drainage remains, cover wound with plastic wrap and then shower.      Follow-up Information   Follow up with Kerin Salen, MD In 2 weeks.   Specialty:  Orthopedic Surgery   Contact information:   [place] [id number] [phone]        Signed: Theodosia Quay 10/05/2013, 2:35 PM

## 2013-10-05 NOTE — Progress Notes (Signed)
PATIENT ID: Kathy [name]  MRN: [id number]  DOB/AGE:  [birth year] / 73 y.o.  2 Days Post-Op Procedure(s) (LRB): TOTAL HIP REVISION- left (Left)    PROGRESS NOTE Subjective: Patient is alert, oriented,no Nausea, no Vomiting, yes passing gas, no Bowel Movement. Taking PO well. Denies SOB, Chest or Calf Pain. Using Incentive Spirometer, PAS in place. Ambulate WBAT Patient reports pain as 5 on 0-10 scale  .    Objective: Vital signs in last 24 hours: Filed Vitals:   10/05/13 1221 10/05/13 1223 10/05/13 1234 10/05/13 1322  BP: 92/56 99/62 121/55 80/47  Pulse: 81 94 84 74  Temp:    98.9 F (37.2 C)  TempSrc:      Resp:    20  Height:      Weight:      SpO2:    86%      Intake/Output from previous day: I/O last 3 completed shifts: In: 2800 [P.O.:1200; I.V.:1600] Out: -    Intake/Output this shift:     LABORATORY DATA:  Recent Labs  10/04/13 0430 10/05/13 0501  WBC 5.5 5.2  HGB 9.1* 8.0*  HCT 26.6* 23.2*  PLT 189 155  NA 140  --   K 4.9  --   CL 104  --   CO2 27  --   BUN 6  --   CREATININE 0.84  --   GLUCOSE 103*  --   CALCIUM 8.5  --     Examination: Neurologically intact ABD soft Neurovascular intact Sensation intact distally Intact pulses distally Dorsiflexion/Plantar flexion intact Incision: no drainage No cellulitis present} XR AP&Lat of hip shows well placed\fixed THA  Assessment:   2 Days Post-Op Procedure(s) (LRB): TOTAL HIP REVISION- left (Left) ADDITIONAL DIAGNOSIS:  Plan: PT/OT WBAT, THA  posterior precautions  DVT Prophylaxis: SCDx72 hrs, ASA 325 mg BID x 2 weeks  DISCHARGE PLAN: Skilled Nursing Facility/Rehab  [facility]  DISCHARGE NEEDS: HHPT, HHRN, Walker and 3-in-1 comode seat

## 2013-10-05 NOTE — Progress Notes (Signed)
Physical Therapy Treatment Patient Details Name: Kathy [name] MRN: [id number] DOB: Dec 10, 1940 Today's Date: 10/05/2013 Time: 9767-3419 PT Time Calculation (min): 20 min  PT Assessment / Plan / Recommendation  History of Present Illness pt presents with L THRevision.     PT Comments   Pt limited by fatigue today only ambulating 20 feet this morning, complains of minor dizziness (see vitals below). She verbalizes understanding of posterior hip precautions. Will follow up with additional therapy this afternoon to work on gait training and exercises.  Follow Up Recommendations  SNF     Does the patient have the potential to tolerate intense rehabilitation     Barriers to Discharge        Equipment Recommendations  None recommended by PT    Recommendations for Other Services    Frequency 7X/week   Progress towards PT Goals Progress towards PT goals: Progressing toward goals (amb, limited today)  Plan Current plan remains appropriate    Precautions / Restrictions Precautions Precautions: Posterior Hip;Fall Restrictions Weight Bearing Restrictions: Yes LLE Weight Bearing: Weight bearing as tolerated   Pertinent Vitals/Pain Blood pressure 92/56 at rest with HR 81 BPM Blood pressure 99/62 standing with HR 94 Blood pressure 121/55 seated after ambulating HR 84  Pain 5/10 requests meds be held until after therapy Repositioned for comfort in recliner.    Mobility  Transfers Overall transfer level: Needs assistance Equipment used: Rolling walker (2 wheeled) Transfers: Sit to/from Stand Sit to Stand: Min guard General transfer comment: Verbal cues for hand placement and controlling descent to sitting.   Ambulation/Gait Ambulation/Gait assistance: Min guard Ambulation Distance (Feet): 20 Feet Assistive device: Rolling walker (2 wheeled) Gait Pattern/deviations: Step-to pattern;Step-through pattern;Decreased stride length;Decreased stance time - left;Antalgic General Gait  Details: Verbal cues for sequencing, focusing on step-through gait pattern. Pt limited by fatigue.    Exercises Total Joint Exercises Ankle Circles/Pumps: AROM;Both;10 reps   PT Diagnosis:    PT Problem List:   PT Treatment Interventions:     PT Goals (current goals can now be found in the care plan section) Acute Rehab PT Goals PT Goal Formulation: With patient Time For Goal Achievement: 10/18/13 Potential to Achieve Goals: Good  Visit Information  Last PT Received On: 10/05/13 Assistance Needed: +1 History of Present Illness: pt presents with L THRevision.      Subjective Data  Subjective: Feeling tired today   Cognition  Cognition Arousal/Alertness: Awake/alert Behavior During Therapy: WFL for tasks assessed/performed Overall Cognitive Status: Within Functional Limits for tasks assessed    Balance     End of Session PT - End of Session Equipment Utilized During Treatment: Gait belt Activity Tolerance: Patient limited by fatigue Patient left: in chair;with call bell/phone within reach;with family/visitor present Nurse Communication: Mobility status (possible dc today or tomorrow)   GP    [name], [name]  [name] 10/05/2013, 2:18 PM

## 2013-10-05 NOTE — Progress Notes (Signed)
Occupational Therapy Treatment Patient Details Name: Yulanda Diggs Stigler MRN: [id number] DOB: 20-Sep-1940 Today's Date: 10/05/2013 Time: 6073-7106 OT Time Calculation (min): 17 min  OT Assessment / Plan / Recommendation  History of present illness pt presents with L THRevision.     OT comments  This 73 yo making progress and will greatly benefit from OT at SNF to get to an Independent/ Mod I level.  Follow Up Recommendations  SNF       Equipment Recommendations  None recommended by OT       Frequency Min 2X/week   Progress towards OT Goals Progress towards OT goals: Progressing toward goals  Plan Discharge plan remains appropriate    Precautions / Restrictions Precautions Precautions: Posterior Hip;Fall Restrictions Weight Bearing Restrictions: Yes LLE Weight Bearing: Weight bearing as tolerated       ADL  Lower Body Dressing: Min guard (with AE) Where Assessed - Lower Body Dressing: Unsupported sit to stand Equipment Used: Reacher;Sock aid  Made her aware she could get AE in [facility] gift shop if she wished.     OT Goals(current goals can now be found in the care plan section)    Visit Information  Last OT Received On: 10/05/13 Assistance Needed: +1 History of Present Illness: pt presents with L THRevision.            Cognition  Cognition Arousal/Alertness: Awake/alert Behavior During Therapy: WFL for tasks assessed/performed Overall Cognitive Status: Within Functional Limits for tasks assessed             End of Session OT - End of Session Activity Tolerance: Patient tolerated treatment well Patient left: in chair;with call bell/phone within reach;with family/visitor present       Almon Register [phone] 10/05/2013, 4:10 PM

## 2013-10-05 NOTE — Progress Notes (Signed)
Physical Therapy Treatment Note  Pt declines working with PT this afternoon, she states she has just gotten back into bed and is awaiting discharge to SNF this evening.  Does not report any pain, is lying comfortably in bed. Has no requests at this time.    10/05/13 1500  PT Visit Information  Last PT Received On 10/05/13  Reason Eval/Treat Not Completed Other (comment) (Patient declined, she is planning to dc this evening)    [name], [place]

## 2013-10-08 LAB — ANAEROBIC CULTURE

## 2013-10-13 ENCOUNTER — Encounter: Payer: Self-pay | Admitting: Internal Medicine

## 2013-10-13 ENCOUNTER — Non-Acute Institutional Stay (SKILLED_NURSING_FACILITY): Payer: Medicare HMO | Admitting: Internal Medicine

## 2013-10-13 DIAGNOSIS — I10 Essential (primary) hypertension: Secondary | ICD-10-CM

## 2013-10-13 DIAGNOSIS — M810 Age-related osteoporosis without current pathological fracture: Secondary | ICD-10-CM | POA: Insufficient documentation

## 2013-10-13 DIAGNOSIS — E785 Hyperlipidemia, unspecified: Secondary | ICD-10-CM

## 2013-10-13 DIAGNOSIS — Z96649 Presence of unspecified artificial hip joint: Secondary | ICD-10-CM

## 2013-10-13 DIAGNOSIS — F411 Generalized anxiety disorder: Secondary | ICD-10-CM

## 2013-10-13 DIAGNOSIS — F419 Anxiety disorder, unspecified: Secondary | ICD-10-CM | POA: Insufficient documentation

## 2013-10-13 DIAGNOSIS — E039 Hypothyroidism, unspecified: Secondary | ICD-10-CM | POA: Insufficient documentation

## 2013-10-13 NOTE — Assessment & Plan Note (Signed)
Left on 3/4; pt is admitted for OT/PT;pt on oxycodone, robaxin and ASA BID for prophylaxis

## 2013-10-13 NOTE — Assessment & Plan Note (Signed)
Continue levothyroxine 

## 2013-10-13 NOTE — Assessment & Plan Note (Signed)
Continue fish oil 

## 2013-10-13 NOTE — Assessment & Plan Note (Signed)
Pt on Vit D and calcium-to continue and Fosamax weekly

## 2013-10-13 NOTE — Assessment & Plan Note (Signed)
Continue prn alprazolam

## 2013-10-13 NOTE — Assessment & Plan Note (Signed)
Continue norvasc and lasix

## 2013-10-13 NOTE — Progress Notes (Signed)
MRN: [id number] Name: Kathy [name]  Sex: female Age: 73 y.o. DOB: [birth year]  [place] #: [facility] Facility/Room: 703 Level Of Care: SNF Provider: Inocencio Homes D Emergency Contacts: Extended Emergency Contact Information Primary Emergency Contact: Scaled,[name] Address: [place] [address] [name] of [place] Phone: [phone] Mobile Phone: [phone] Relation: Brother Secondary Emergency Contact: Coriz,Donnie Address: [place] [address] [name] of [place] Phone: [phone] Mobile Phone: [phone] Relation: Brother  Code Status: FULL  Allergies: Review of patient's allergies indicates no known allergies.  Chief Complaint  Patient presents with  . nursing home admission    HPI: Patient is 73 y.o. female who had a total L hip revision on 3/4 who is admitted to SNF for OT/PT.  Past Medical History  Diagnosis Date  . Hypothyroidism     takes Synthroid daily  . Peripheral edema     takes Lasix daily as needed  . PONV (postoperative nausea and vomiting)   . History of bronchitis     last time many yrs ago  . Headache(784.0)     occasionally  . Numbness     occasionally left arm at night  . Arthritis   . Joint pain   . Diverticulosis   . Nocturia   . History of blood transfusion     no abnormal reaction noted  . History of blood transfusion     no abnormal reaction noted  . Osteoporosis     takes Fosamax weekly  . Hypertension     takes Amlodipine daily  . Hypothyroid   . Hyperlipidemia   . Anxiety     takes Alprazolam daily as needed    Past Surgical History  Procedure Laterality Date  . Cholecystectomy    . Abdominal hysterectomy    . Total thyroidectomy    . Gastric bypass surgery    . Joint replacement Bilateral     hip  . Colonoscopy    . Cataract surgery Bilateral   . Revision total hip arthroplasty Left 10/03/2013    DR Mayer Camel  . Total hip  revision Left 10/03/2013    Procedure: TOTAL HIP REVISION- left;  Surgeon: Kerin Salen, MD;  Location: [facility];  Service: Orthopedics;  Laterality: Left;      Medication List       This list is accurate as of: 10/13/13  3:18 PM.  Always use your most recent med list.               alendronate 70 MG tablet  Commonly known as:  FOSAMAX  Take 70 mg by mouth every Wednesday. Take with a full glass of water on an empty stomach.     ALPRAZolam 0.25 MG tablet  Commonly known as:  XANAX  Take 0.25 mg by mouth at bedtime as needed for anxiety or sleep.     amLODipine 5 MG tablet  Commonly known as:  NORVASC  Take 5 mg by mouth daily as needed (when blood pressure gets high).     aspirin EC 325 MG tablet  Take 1 tablet (325 mg total) by mouth 2 (two) times daily.     calcium carbonate 1250 MG capsule  Take 1,250 mg by mouth 3 (three) times daily.     Cholecalciferol 1000 UNITS Tbdp  Take 1,000 Units by mouth 2 (two) times daily.  diclofenac sodium 1 % Gel  Commonly known as:  VOLTAREN  Apply 2 g topically 2 (two) times daily as needed (for pain in knees and shoulder).     Fish Oil 1000 MG Caps  Take 1,000 mg by mouth daily.     furosemide 40 MG tablet  Commonly known as:  LASIX  Take 40 mg by mouth daily as needed for fluid.     levothyroxine 112 MCG tablet  Commonly known as:  SYNTHROID, LEVOTHROID  Take 112 mcg by mouth daily before breakfast.     methocarbamol 500 MG tablet  Commonly known as:  ROBAXIN  Take 1 tablet (500 mg total) by mouth 2 (two) times daily with a meal.     multivitamin with minerals Tabs tablet  Take 1 tablet by mouth 2 (two) times daily.     oxyCODONE-acetaminophen 5-325 MG per tablet  Commonly known as:  ROXICET  Take 1 tablet by mouth every 4 (four) hours as needed.     traMADol 50 MG tablet  Commonly known as:  ULTRAM  Take 50 mg by mouth every 6 (six) hours as needed for moderate pain.     vitamin C 500 MG tablet  Commonly known as:   ASCORBIC ACID  Take 500 mg by mouth 2 (two) times daily.        No orders of the defined types were placed in this encounter.     There is no immunization history on file for this patient.  History  Substance Use Topics  . Smoking status: Never Smoker   . Smokeless tobacco: Never Used  . Alcohol Use: No    Family history is noncontributory    Review of Systems  DATA OBTAINED: from patient; pt has no c/o GENERAL: Feels well no fevers, fatigue, appetite changes SKIN: No itching, rash or wounds EYES: No eye pain, redness, discharge EARS: No earache, tinnitus, change in hearing NOSE: No congestion, drainage or bleeding  MOUTH/THROAT: No mouth or tooth pain, No sore throat, No difficulty chewing or swallowing  RESPIRATORY: No cough, wheezing, SOB CARDIAC: No chest pain, palpitations, lower extremity edema  GI: No abdominal pain, No N/V/D or constipation, No heartburn or reflux  GU: No dysuria, frequency or urgency, or incontinence  MUSCULOSKELETAL: No unrelieved bone/joint pain NEUROLOGIC: No headache, dizziness or focal weakness PSYCHIATRIC: No overt anxiety or sadness. Sleeps well. No behavior issue.   Filed Vitals:   10/13/13 1505  BP: 102/60  Pulse: 78  Temp: 98.4 F (36.9 C)  Resp: 18    Physical Exam  GENERAL APPEARANCE: Alert, conversant. Appropriately groomed. No acute distress.  SKIN: No diaphoresis rash HEAD: Normocephalic, atraumatic  EYES: Conjunctiva/lids clear. Pupils round, reactive. EOMs intact.  EARS: External exam WNL, canals clear. Hearing grossly normal.  NOSE: No deformity or discharge.  MOUTH/THROAT: Lips w/o lesions.  RESPIRATORY: Breathing is even, unlabored. Lung sounds are clear   CARDIOVASCULAR: Heart RRR no murmurs, rubs or gallops. Trace LLE peripheral edema  GASTROINTESTINAL: Abdomen is soft, non-tender, not distended w/ normal bowel sounds GENITOURINARY: Bladder non tender, not distended  MUSCULOSKELETAL: No abnormal joints or  musculature NEUROLOGIC: Oriented X3. Cranial nerves 2-12 grossly intact. Moves all extremities no tremor. PSYCHIATRIC: Mood and affect appropriate to situation, no behavioral issues  Patient Active Problem List   Diagnosis Date Noted  . Osteoporosis   . Hypertension   . Hypothyroid   . Hyperlipidemia   . Anxiety   . 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  CBC    Component Value Date/Time   WBC 5.2 10/05/2013 0501   RBC 2.50* 10/05/2013 0501   HGB 8.0* 10/05/2013 0501   HCT 23.2* 10/05/2013 0501   PLT 155 10/05/2013 0501   MCV 92.8 10/05/2013 0501   LYMPHSABS 2.6 09/26/2013 1042   MONOABS 0.4 09/26/2013 1042   EOSABS 0.1 09/26/2013 1042   BASOSABS 0.0 09/26/2013 1042    CMP     Component Value Date/Time   NA 140 10/04/2013 0430   K 4.9 10/04/2013 0430   CL 104 10/04/2013 0430   CO2 27 10/04/2013 0430   GLUCOSE 103* 10/04/2013 0430   BUN 6 10/04/2013 0430   CREATININE 0.84 10/04/2013 0430   CALCIUM 8.5 10/04/2013 0430   GFRNONAA 68* 10/04/2013 0430   GFRAA 79* 10/04/2013 0430    Assessment and Plan  S/P revision of total hip Left on 3/4; pt is admitted for OT/PT;pt on oxycodone, robaxin and ASA BID for prophylaxis  Hypertension Continue norvasc and lasix  Hypothyroid Continue levothyroxine  Osteoporosis Pt on Vit D and calcium-to continue and Fosamax weekly  Hyperlipidemia Continue fish oil  Anxiety Continue prn alprazolam    Hennie Duos, MD

## 2013-10-19 ENCOUNTER — Encounter: Payer: Self-pay | Admitting: Adult Health

## 2013-10-19 ENCOUNTER — Non-Acute Institutional Stay (SKILLED_NURSING_FACILITY): Payer: Medicare HMO | Admitting: Adult Health

## 2013-10-19 DIAGNOSIS — J309 Allergic rhinitis, unspecified: Secondary | ICD-10-CM

## 2013-10-19 DIAGNOSIS — K219 Gastro-esophageal reflux disease without esophagitis: Secondary | ICD-10-CM | POA: Insufficient documentation

## 2013-10-19 DIAGNOSIS — D649 Anemia, unspecified: Secondary | ICD-10-CM

## 2013-10-19 NOTE — Progress Notes (Signed)
Patient ID: Kathy [name], female   DOB: 04-Jun-1941, 73 y.o.   MRN: [id number]         PROGRESS NOTE  DATE: 10/19/2013  FACILITY:  [facility]  LEVEL OF CARE: SNF (31)  Acute Visit  CHIEF COMPLAINT:  Manage Allergic Rhinitis and GERD  HISTORY OF PRESENT ILLNESS:This is a 73 year old female who reports having dark stools. She is not on iron supplements. She is currently taking ASA 325 mg BID for DVT prophylaxis S/P left total hip revision. 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 She, also, complains of sneezing a lot and clear nasal drainage.  PAST MEDICAL HISTORY : Reviewed.  No changes.  CURRENT MEDICATIONS: Reviewed per [facility]  REVIEW OF SYSTEMS:  GENERAL: no change in appetite, no fatigue, no weight changes, no fever, chills or weakness RESPIRATORY: no cough, SOB, DOE,, wheezing, hemoptysis CARDIAC: no chest pain, edema or palpitations GI: no abdominal pain, diarrhea, constipation, heart burn, nausea or vomiting  PHYSICAL EXAMINATION  GENERAL: no acute distress, normal body habitus EYES: conjunctivae normal, sclerae normal, normal eye lids NECK: supple, trachea midline, no neck masses, no thyroid tenderness, no thyromegaly LYMPHATICS: no LAN in the neck, no supraclavicular LAN RESPIRATORY: breathing is even & unlabored, BS CTAB CARDIAC: RRR, no murmur,no extra heart sounds, no edema GI: abdomen soft, normal BS, no masses, no tenderness, no hepatomegaly, no splenomegaly PSYCHIATRIC: the patient is alert & oriented to person, affect & behavior appropriate  LABS/RADIOLOGY: Labs reviewed: Basic Metabolic Panel:  Recent Labs  09/26/13 1042 10/04/13 0430  NA 141 140  K 4.0 4.9  CL 102 104  CO2 29 27  GLUCOSE 74 103*  BUN 10 6  CREATININE 0.80 0.84  CALCIUM 9.8 8.5   CBC:  Recent Labs  09/26/13 1042 10/04/13 0430 10/05/13 0501  WBC 4.6 5.5 5.2  NEUTROABS 1.6*  --   --   HGB 11.9* 9.1* 8.0*  HCT 35.4*  26.6* 23.2*  MCV 93.7 92.0 92.8  PLT 217 189 155     ASSESSMENT/PLAN:  GERD - start Prilosec 20 mg PO Q D  Allergic rhinitis - start Claritin 10 mg PO Q D  Anemia - check CBC next lab draw; check stool occult blood x 3   CPT CODE: 72094  Alverta Caccamo Vargas - NP [facility] [phone]

## 2013-10-24 ENCOUNTER — Non-Acute Institutional Stay (SKILLED_NURSING_FACILITY): Payer: Medicare HMO | Admitting: Adult Health

## 2013-10-24 ENCOUNTER — Encounter: Payer: Self-pay | Admitting: Adult Health

## 2013-10-24 DIAGNOSIS — Z96649 Presence of unspecified artificial hip joint: Secondary | ICD-10-CM

## 2013-10-24 DIAGNOSIS — E785 Hyperlipidemia, unspecified: Secondary | ICD-10-CM

## 2013-10-24 DIAGNOSIS — F419 Anxiety disorder, unspecified: Secondary | ICD-10-CM

## 2013-10-24 DIAGNOSIS — K219 Gastro-esophageal reflux disease without esophagitis: Secondary | ICD-10-CM

## 2013-10-24 DIAGNOSIS — I10 Essential (primary) hypertension: Secondary | ICD-10-CM

## 2013-10-24 DIAGNOSIS — D649 Anemia, unspecified: Secondary | ICD-10-CM

## 2013-10-24 DIAGNOSIS — F411 Generalized anxiety disorder: Secondary | ICD-10-CM

## 2013-10-24 DIAGNOSIS — M810 Age-related osteoporosis without current pathological fracture: Secondary | ICD-10-CM

## 2013-10-24 DIAGNOSIS — J309 Allergic rhinitis, unspecified: Secondary | ICD-10-CM

## 2013-10-24 DIAGNOSIS — E039 Hypothyroidism, unspecified: Secondary | ICD-10-CM

## 2013-10-24 NOTE — Progress Notes (Signed)
Patient ID: Kathy [name], female   DOB: July 17, 1941, 73 y.o.   MRN: [id number]           PROGRESS NOTE  DATE:  10/24/13  FACILITY:  [facility]  LEVEL OF CARE: SNF (31)  Acute Visit  CHIEF COMPLAINT:  Discharge Notes  HISTORY OF PRESENT ILLNESS:This is a 73 year old female who is for discharge home with Home health PT, OT and Nursing. DME: Rolling walker. She has been admitted to [facility] on 10/05/13 from [facility] S/P Revision of left total hip. Patient was admitted to this facility for short-term rehabilitation after the patient's recent hospitalization.  Patient has completed SNF rehabilitation and therapy has cleared the patient for discharge.  RE-ASSESSMENT OF ONGOING PROBLEMS:  HTN: Pt 's HTN remains stable.  Denies CP, sob, DOE, pedal edema, headaches, dizziness or visual disturbances.  No complications from the medications currently being used.  Last BP : 100/70  OSTEOPOROSIS: The patient's osteoporosis remains stable. The patient denies recent worsening of pain and the range of motion remains stable. There has not been any evidence of fractures. No complications noted from the medications presently being used.  ANXIETY: The anxiety remains stable. Patient denies ongoing anxiety or irritability. No complications reported from the medications currently being used.   PAST MEDICAL HISTORY : Reviewed.  No changes.  CURRENT MEDICATIONS: Reviewed per [facility]  REVIEW OF SYSTEMS:  GENERAL: no change in appetite, no fatigue, no weight changes, no fever, chills or weakness RESPIRATORY: no cough, SOB, DOE,, wheezing, hemoptysis CARDIAC: no chest pain, edema or palpitations GI: no abdominal pain, diarrhea, constipation, heart burn, nausea or vomiting  PHYSICAL EXAMINATION  GENERAL: no acute distress, normal body habitus NECK: supple, trachea midline, no neck masses, no thyroid tenderness, no thyromegaly LYMPHATICS: no LAN in the neck, no supraclavicular  LAN RESPIRATORY: breathing is even & unlabored, BS CTAB CARDIAC: RRR, no murmur,no extra heart sounds, no edema GI: abdomen soft, normal BS, no masses, no tenderness, no hepatomegaly, no splenomegaly EXTREMITIES:  walks with a cane PSYCHIATRIC: the patient is alert & oriented to person, affect & behavior appropriate  LABS/RADIOLOGY: 10/22/13 WBC 4.6 hemoglobin 8.3 hematocrit 27.0 Labs reviewed: Basic Metabolic Panel:  Recent Labs  09/26/13 1042 10/04/13 0430  NA 141 140  K 4.0 4.9  CL 102 104  CO2 29 27  GLUCOSE 74 103*  BUN 10 6  CREATININE 0.80 0.84  CALCIUM 9.8 8.5   CBC:  Recent Labs  09/26/13 1042 10/04/13 0430 10/05/13 0501  WBC 4.6 5.5 5.2  NEUTROABS 1.6*  --   --   HGB 11.9* 9.1* 8.0*  HCT 35.4* 26.6* 23.2*  MCV 93.7 92.0 92.8  PLT 217 189 155     ASSESSMENT/PLAN:  Status post revision of left total hip - for Home health PT, OT and Nursing GERD - continue Prilosec Allergic rhinitis - continue Claritin Anemia - continue FeSO4 Osteoporosis - continue Fosamax Hypertension - well-controlled; continue Norvasc Hypothyroidism - stable; continue Synthroid Hyperlipidemia - continue Fish Oil Anxiety - continue Xanax PRN    have filled out patient's discharge paperwork and written prescriptions.  Patient will receive home health PT, OT and Nursing.  DME provided:  Rolling walker  Total discharge time: Greater than 30 minutes Discharge time involved coordination of the discharge process with Education officer, museum, nursing staff and therapy department. Medical justification for home health services/DME verified.   CPT CODE: 01751  Seth Bake - NP [facility] [phone]

## 2014-01-24 LAB — HM PAP SMEAR: HM Pap smear: NEGATIVE

## 2014-05-17 ENCOUNTER — Other Ambulatory Visit: Payer: Self-pay

## 2014-08-20 ENCOUNTER — Other Ambulatory Visit (HOSPITAL_COMMUNITY): Payer: Self-pay | Admitting: Pulmonary Disease

## 2014-08-20 DIAGNOSIS — Z1231 Encounter for screening mammogram for malignant neoplasm of breast: Secondary | ICD-10-CM

## 2014-09-02 ENCOUNTER — Ambulatory Visit (HOSPITAL_COMMUNITY)
Admission: RE | Admit: 2014-09-02 | Discharge: 2014-09-02 | Disposition: A | Discharge status: Home / Self Care | Payer: Medicare HMO | Source: Ambulatory Visit | Attending: Pulmonary Disease | Admitting: Pulmonary Disease

## 2014-09-02 DIAGNOSIS — Z1231 Encounter for screening mammogram for malignant neoplasm of breast: Secondary | ICD-10-CM

## 2015-01-27 ENCOUNTER — Other Ambulatory Visit: Payer: Self-pay

## 2015-09-04 ENCOUNTER — Other Ambulatory Visit (HOSPITAL_COMMUNITY): Payer: Self-pay | Admitting: Pulmonary Disease

## 2015-09-04 DIAGNOSIS — Z1231 Encounter for screening mammogram for malignant neoplasm of breast: Secondary | ICD-10-CM

## 2015-09-05 ENCOUNTER — Ambulatory Visit (HOSPITAL_COMMUNITY)
Admission: RE | Admit: 2015-09-05 | Discharge: 2015-09-05 | Disposition: A | Discharge status: Home / Self Care | Payer: Commercial Managed Care - HMO | Source: Ambulatory Visit | Attending: Pulmonary Disease | Admitting: Pulmonary Disease

## 2015-09-05 DIAGNOSIS — Z1231 Encounter for screening mammogram for malignant neoplasm of breast: Secondary | ICD-10-CM | POA: Diagnosis not present

## 2016-01-20 ENCOUNTER — Other Ambulatory Visit (HOSPITAL_COMMUNITY): Payer: Self-pay | Admitting: Pulmonary Disease

## 2016-01-20 ENCOUNTER — Ambulatory Visit (HOSPITAL_COMMUNITY)
Admission: RE | Admit: 2016-01-20 | Discharge: 2016-01-20 | Disposition: A | Discharge status: Home / Self Care | Payer: Commercial Managed Care - HMO | Source: Ambulatory Visit | Attending: Pulmonary Disease | Admitting: Pulmonary Disease

## 2016-01-20 DIAGNOSIS — M19012 Primary osteoarthritis, left shoulder: Secondary | ICD-10-CM | POA: Insufficient documentation

## 2016-01-20 DIAGNOSIS — R52 Pain, unspecified: Secondary | ICD-10-CM | POA: Insufficient documentation

## 2016-01-20 DIAGNOSIS — M19011 Primary osteoarthritis, right shoulder: Secondary | ICD-10-CM | POA: Diagnosis not present

## 2016-03-17 ENCOUNTER — Other Ambulatory Visit: Payer: Self-pay | Admitting: Orthopedic Surgery

## 2016-03-17 DIAGNOSIS — M75100 Unspecified rotator cuff tear or rupture of unspecified shoulder, not specified as traumatic: Secondary | ICD-10-CM

## 2016-03-17 DIAGNOSIS — M25519 Pain in unspecified shoulder: Secondary | ICD-10-CM

## 2016-03-23 ENCOUNTER — Ambulatory Visit
Admission: RE | Admit: 2016-03-23 | Discharge: 2016-03-23 | Disposition: A | Discharge status: Home / Self Care | Payer: Commercial Managed Care - HMO | Source: Ambulatory Visit | Attending: Orthopedic Surgery | Admitting: Orthopedic Surgery

## 2016-03-23 DIAGNOSIS — M75100 Unspecified rotator cuff tear or rupture of unspecified shoulder, not specified as traumatic: Secondary | ICD-10-CM

## 2016-03-23 DIAGNOSIS — M25519 Pain in unspecified shoulder: Secondary | ICD-10-CM

## 2016-04-23 ENCOUNTER — Inpatient Hospital Stay (HOSPITAL_COMMUNITY): Admission: RE | Admit: 2016-04-23 | Payer: Medicare HMO | Source: Ambulatory Visit

## 2016-04-30 ENCOUNTER — Other Ambulatory Visit: Payer: Self-pay | Admitting: Orthopedic Surgery

## 2016-05-04 ENCOUNTER — Encounter (HOSPITAL_COMMUNITY): Payer: Self-pay

## 2016-05-04 ENCOUNTER — Encounter (HOSPITAL_COMMUNITY)
Admission: RE | Admit: 2016-05-04 | Discharge: 2016-05-04 | Disposition: A | Discharge status: Home / Self Care | Payer: Commercial Managed Care - HMO | Source: Ambulatory Visit | Attending: Orthopedic Surgery | Admitting: Orthopedic Surgery

## 2016-05-04 DIAGNOSIS — Z01818 Encounter for other preprocedural examination: Secondary | ICD-10-CM | POA: Diagnosis present

## 2016-05-04 LAB — CBC
HCT: 37.1 % (ref 36.0–46.0)
Hemoglobin: 11.9 g/dL — ABNORMAL LOW (ref 12.0–15.0)
MCH: 31 pg (ref 26.0–34.0)
MCHC: 32.1 g/dL (ref 30.0–36.0)
MCV: 96.6 fL (ref 78.0–100.0)
Platelets: 251 10*3/uL (ref 150–400)
RBC: 3.84 MIL/uL — ABNORMAL LOW (ref 3.87–5.11)
RDW: 13.9 % (ref 11.5–15.5)
WBC: 4.7 10*3/uL (ref 4.0–10.5)

## 2016-05-04 LAB — SURGICAL PCR SCREEN
MRSA, PCR: NEGATIVE
Staphylococcus aureus: NEGATIVE

## 2016-05-04 LAB — BASIC METABOLIC PANEL
Anion gap: 6 (ref 5–15)
BUN: 9 mg/dL (ref 6–20)
CO2: 27 mmol/L (ref 22–32)
Calcium: 9.5 mg/dL (ref 8.9–10.3)
Chloride: 107 mmol/L (ref 101–111)
Creatinine, Ser: 0.62 mg/dL (ref 0.44–1.00)
GFR calc Af Amer: >60 mL/min (ref >60)
GFR calc non Af Amer: >60 mL/min (ref >60)
Glucose, Bld: 85 mg/dL (ref 65–99)
Potassium: 4.3 mmol/L (ref 3.5–5.1)
Sodium: 140 mmol/L (ref 135–145)

## 2016-05-04 NOTE — Pre-Procedure Instructions (Signed)
    Leroy  05/04/2016      [facility] - [place], [address] [place] [address] Phone: [phone] Fax: [phone]    Your procedure is scheduled on Tues. Oct. 10  Report to [facility] Admitting at 5:30 A.M.  Call this number if you have problems the morning of surgery:  [phone]   Remember:  Do not eat food or drink liquids after midnight on Mon. Oct 9  Take these medicines the morning of surgery with A SIP OF WATER: alprazolam (xanax) if needed, amlodipine (norvasc) if needed, loratadine (claritin) if needed, synthroid, tramadol if needed             STOP NSAIDS: aspirin, advil, ibuprofen, motrin, aleve (naproxen sodium) meloxicam (mobic), voltaren gel, fish oil, vitamins and herbal medicines.            Do not wear jewelry, make-up or nail polish.  Do not wear lotions, powders, or perfumes, or deoderant.  Do not shave 48 hours prior to surgery.  Men may shave face and neck.  Do not bring valuables to the hospital.  [facility] is not responsible for any belongings or valuables.  Contacts, dentures or bridgework may not be worn into surgery.  Leave your suitcase in the car.  After surgery it may be brought to your room.  For patients admitted to the hospital, discharge time will be determined by your treatment team.  Patients discharged the day of surgery will not be allowed to drive home.    Special instructions: review all handouts  Please read over the following fact sheets that you were given. MRSA Information

## 2016-05-04 NOTE — Progress Notes (Addendum)
PCP:Dr. Sinda Du in [place].  Stress test prior to gastric bypass surgery (2010) [facility] in epic care everywhere- negative.  Pt. Reports family history of blood clotts. Sister died from blood clott after surgery. States father had blood clotts in leg after being in car accident. Nephew with sickle cell that has had blood clotts.

## 2016-05-05 NOTE — Progress Notes (Signed)
Anesthesia Chart Review:  Patient is a 75 year old female scheduled for right total shoulder arthroplasty on 05/11/16 by Dr. Mardelle Matte.  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 She reports a family history of "blood clots" (sister had a fatal one after surgery and father had a leg DVT after MVA; nephew with sickle cell anemia). BMI is 30.5.   PCP is Dr. Sinda Du in [place].  Meds include Fosamax, Xanax, amlodipine, ASA 81 mg, Lasix, Claritin, fish oil, Prilosec, Synthroid, tramadol.  BP (!) 159/91   Pulse 68   Temp 36.7 C   Resp 20   Ht 5' (1.524 m)   Wt 156 lb 4.8 oz (70.9 kg)   SpO2 100%   BMI 30.53 kg/m   EKG 05/04/16: SR, right BBB. Since last tracing 09/26/13, rate is faster.   Stress Echo 05/01/09 [facility] Care Everywhere): Negative dobutamine stress echo with no echocardiographic evidence of inducible ischemia. MHR achieved 84%. 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   Preoperative labs noted.    EKG appears stable. If no acute changes then I would anticipate that she could proceed as planned.  George Hugh [facility] Short Stay Center/Anesthesiology Phone [phone] 05/05/2016 10:34 AM

## 2016-05-11 ENCOUNTER — Inpatient Hospital Stay (HOSPITAL_COMMUNITY): Payer: Commercial Managed Care - HMO

## 2016-05-11 ENCOUNTER — Inpatient Hospital Stay (HOSPITAL_COMMUNITY): Payer: Commercial Managed Care - HMO | Admitting: Anesthesiology

## 2016-05-11 ENCOUNTER — Inpatient Hospital Stay (HOSPITAL_COMMUNITY): Payer: Commercial Managed Care - HMO | Admitting: Vascular Surgery

## 2016-05-11 ENCOUNTER — Inpatient Hospital Stay (HOSPITAL_COMMUNITY)
Admission: RE | Admit: 2016-05-11 | Discharge: 2016-05-13 | DRG: 483 | Disposition: A | Discharge status: Skilled Nursing Facility | Payer: Commercial Managed Care - HMO | Source: Ambulatory Visit | Attending: Orthopedic Surgery | Admitting: Orthopedic Surgery

## 2016-05-11 ENCOUNTER — Encounter (HOSPITAL_COMMUNITY): Payer: Self-pay | Admitting: Urology

## 2016-05-11 ENCOUNTER — Encounter (HOSPITAL_COMMUNITY)
Admission: RE | Disposition: A | Discharge status: Skilled Nursing Facility | Payer: Self-pay | Source: Ambulatory Visit | Attending: Orthopedic Surgery

## 2016-05-11 DIAGNOSIS — M12811 Other specific arthropathies, not elsewhere classified, right shoulder: Secondary | ICD-10-CM

## 2016-05-11 DIAGNOSIS — Z90710 Acquired absence of both cervix and uterus: Secondary | ICD-10-CM

## 2016-05-11 DIAGNOSIS — Z7983 Long term (current) use of bisphosphonates: Secondary | ICD-10-CM

## 2016-05-11 DIAGNOSIS — Z9884 Bariatric surgery status: Secondary | ICD-10-CM

## 2016-05-11 DIAGNOSIS — Z96643 Presence of artificial hip joint, bilateral: Secondary | ICD-10-CM | POA: Diagnosis present

## 2016-05-11 DIAGNOSIS — M19011 Primary osteoarthritis, right shoulder: Secondary | ICD-10-CM | POA: Diagnosis present

## 2016-05-11 DIAGNOSIS — I10 Essential (primary) hypertension: Secondary | ICD-10-CM | POA: Diagnosis present

## 2016-05-11 DIAGNOSIS — Z9049 Acquired absence of other specified parts of digestive tract: Secondary | ICD-10-CM | POA: Diagnosis not present

## 2016-05-11 DIAGNOSIS — Z79899 Other long term (current) drug therapy: Secondary | ICD-10-CM | POA: Diagnosis not present

## 2016-05-11 DIAGNOSIS — Z96611 Presence of right artificial shoulder joint: Secondary | ICD-10-CM

## 2016-05-11 DIAGNOSIS — E890 Postprocedural hypothyroidism: Secondary | ICD-10-CM | POA: Diagnosis present

## 2016-05-11 DIAGNOSIS — Z7982 Long term (current) use of aspirin: Secondary | ICD-10-CM

## 2016-05-11 DIAGNOSIS — M810 Age-related osteoporosis without current pathological fracture: Secondary | ICD-10-CM | POA: Diagnosis present

## 2016-05-11 DIAGNOSIS — Z96619 Presence of unspecified artificial shoulder joint: Secondary | ICD-10-CM

## 2016-05-11 HISTORY — PX: REVERSE SHOULDER ARTHROPLASTY: SHX5054

## 2016-05-11 HISTORY — DX: Other specific arthropathies, not elsewhere classified, right shoulder: M12.811

## 2016-05-11 SURGERY — ARTHROPLASTY, SHOULDER, TOTAL, REVERSE
Anesthesia: General | Site: Shoulder | Laterality: Right

## 2016-05-11 MED ORDER — DOCUSATE SODIUM 100 MG PO CAPS
100.0000 mg | ORAL_CAPSULE | Freq: Two times a day (BID) | ORAL | Status: DC
  Administered 2016-05-11 – 2016-05-13 (×5): 100 mg via ORAL
  Filled 2016-05-11 (×5): qty 1

## 2016-05-11 MED ORDER — RIVAROXABAN 10 MG PO TABS
10.0000 mg | ORAL_TABLET | Freq: Every day | ORAL | Status: DC
  Administered 2016-05-11 – 2016-05-12 (×2): 10 mg via ORAL
  Filled 2016-05-11 (×2): qty 1

## 2016-05-11 MED ORDER — MIDAZOLAM HCL 2 MG/2ML IJ SOLN
INTRAMUSCULAR | Status: AC
  Filled 2016-05-11: qty 2

## 2016-05-11 MED ORDER — DIPHENHYDRAMINE HCL 12.5 MG/5ML PO ELIX: 12.5000 mg | ORAL_SOLUTION | ORAL | Status: DC | PRN

## 2016-05-11 MED ORDER — LEVOTHYROXINE SODIUM 88 MCG PO TABS
88.0000 ug | ORAL_TABLET | Freq: Every day | ORAL | Status: DC
  Administered 2016-05-12 – 2016-05-13 (×3): 88 ug via ORAL
  Filled 2016-05-11 (×2): qty 1

## 2016-05-11 MED ORDER — POTASSIUM CHLORIDE IN NACL 20-0.45 MEQ/L-% IV SOLN
INTRAVENOUS | Status: DC
  Administered 2016-05-11: 17:00:00 via INTRAVENOUS
  Filled 2016-05-11 (×2): qty 1000

## 2016-05-11 MED ORDER — ACETAMINOPHEN 325 MG PO TABS
650.0000 mg | ORAL_TABLET | Freq: Four times a day (QID) | ORAL | Status: DC | PRN
  Filled 2016-05-11: qty 2

## 2016-05-11 MED ORDER — SODIUM CHLORIDE 0.9 % IR SOLN
Status: DC | PRN
  Administered 2016-05-11: 1000 mL

## 2016-05-11 MED ORDER — HYDROMORPHONE HCL 1 MG/ML IJ SOLN: 0.2500 mg | INTRAMUSCULAR | Status: DC | PRN

## 2016-05-11 MED ORDER — LACTATED RINGERS IV SOLN
INTRAVENOUS | Status: DC | PRN
  Administered 2016-05-11 (×2): via INTRAVENOUS

## 2016-05-11 MED ORDER — RIVAROXABAN 10 MG PO TABS: 10.0000 mg | ORAL_TABLET | Freq: Every day | ORAL | 0 refills | Status: DC

## 2016-05-11 MED ORDER — PHENYLEPHRINE HCL 10 MG/ML IJ SOLN
INTRAMUSCULAR | Status: DC | PRN
  Administered 2016-05-11 (×2): 120 ug via INTRAVENOUS

## 2016-05-11 MED ORDER — PHENYLEPHRINE HCL 10 MG/ML IJ SOLN
INTRAVENOUS | Status: DC | PRN
  Administered 2016-05-11: 20 ug/min via INTRAVENOUS

## 2016-05-11 MED ORDER — HYDROMORPHONE HCL 1 MG/ML IJ SOLN: 0.5000 mg | INTRAMUSCULAR | Status: DC | PRN

## 2016-05-11 MED ORDER — METOCLOPRAMIDE HCL 5 MG PO TABS: 5.0000 mg | ORAL_TABLET | Freq: Three times a day (TID) | ORAL | Status: DC | PRN

## 2016-05-11 MED ORDER — CALCIUM CARBONATE 1250 (500 CA) MG PO TABS
1250.0000 mg | ORAL_TABLET | Freq: Two times a day (BID) | ORAL | Status: DC
  Administered 2016-05-11 – 2016-05-13 (×4): 1250 mg via ORAL
  Filled 2016-05-11 (×4): qty 1

## 2016-05-11 MED ORDER — PROPOFOL 10 MG/ML IV BOLUS
INTRAVENOUS | Status: AC
  Filled 2016-05-11: qty 20

## 2016-05-11 MED ORDER — ONDANSETRON HCL 4 MG/2ML IJ SOLN
INTRAMUSCULAR | Status: DC | PRN
Start: 2016-05-11 — End: 2016-05-11
  Administered 2016-05-11: 4 mg via INTRAVENOUS

## 2016-05-11 MED ORDER — SENNA-DOCUSATE SODIUM 8.6-50 MG PO TABS: 2.0000 | ORAL_TABLET | Freq: Every day | ORAL | 1 refills | Status: DC

## 2016-05-11 MED ORDER — AMLODIPINE BESYLATE 5 MG PO TABS: 5.0000 mg | ORAL_TABLET | Freq: Every day | ORAL | Status: DC | PRN

## 2016-05-11 MED ORDER — ONDANSETRON HCL 4 MG PO TABS: 4.0000 mg | ORAL_TABLET | Freq: Four times a day (QID) | ORAL | Status: DC | PRN

## 2016-05-11 MED ORDER — LORATADINE 10 MG PO TABS: 10.0000 mg | ORAL_TABLET | Freq: Every day | ORAL | Status: DC | PRN

## 2016-05-11 MED ORDER — ACETAMINOPHEN 650 MG RE SUPP: 650.0000 mg | Freq: Four times a day (QID) | RECTAL | Status: DC | PRN

## 2016-05-11 MED ORDER — CALCIUM CARBONATE 1250 (500 CA) MG PO CAPS: 300.0000 mg | ORAL_CAPSULE | ORAL | Status: DC

## 2016-05-11 MED ORDER — CHOLECALCIFEROL 25 MCG (1000 UT) PO TBDP: 1000.0000 [IU] | ORAL_TABLET | Freq: Two times a day (BID) | ORAL | Status: DC

## 2016-05-11 MED ORDER — SENNA 8.6 MG PO TABS
1.0000 | ORAL_TABLET | Freq: Two times a day (BID) | ORAL | Status: DC
  Administered 2016-05-11 – 2016-05-13 (×5): 8.6 mg via ORAL
  Filled 2016-05-11 (×5): qty 1

## 2016-05-11 MED ORDER — METOCLOPRAMIDE HCL 5 MG/ML IJ SOLN
5.0000 mg | Freq: Three times a day (TID) | INTRAMUSCULAR | Status: DC | PRN
Start: 2016-05-11 — End: 2016-05-13

## 2016-05-11 MED ORDER — OXYCODONE HCL 5 MG PO TABS
5.0000 mg | ORAL_TABLET | ORAL | Status: DC | PRN
  Administered 2016-05-11 – 2016-05-13 (×8): 10 mg via ORAL
  Filled 2016-05-11 (×8): qty 2

## 2016-05-11 MED ORDER — ADULT MULTIVITAMIN W/MINERALS CH
1.0000 | ORAL_TABLET | Freq: Every day | ORAL | Status: DC
  Administered 2016-05-11 – 2016-05-13 (×3): 1 via ORAL
  Filled 2016-05-11 (×3): qty 1

## 2016-05-11 MED ORDER — SUGAMMADEX SODIUM 200 MG/2ML IV SOLN
INTRAVENOUS | Status: DC | PRN
  Administered 2016-05-11: 150 mg via INTRAVENOUS

## 2016-05-11 MED ORDER — METHOCARBAMOL 500 MG PO TABS
500.0000 mg | ORAL_TABLET | Freq: Four times a day (QID) | ORAL | Status: DC | PRN
  Administered 2016-05-11 – 2016-05-12 (×4): 500 mg via ORAL
  Filled 2016-05-11 (×4): qty 1

## 2016-05-11 MED ORDER — MIDAZOLAM HCL 5 MG/5ML IJ SOLN
INTRAMUSCULAR | Status: DC | PRN
  Administered 2016-05-11 (×2): .5 mg via INTRAVENOUS

## 2016-05-11 MED ORDER — TRAMADOL HCL 50 MG PO TABS: 50.0000 mg | ORAL_TABLET | Freq: Four times a day (QID) | ORAL | Status: DC | PRN

## 2016-05-11 MED ORDER — BUPIVACAINE-EPINEPHRINE (PF) 0.5% -1:200000 IJ SOLN
INTRAMUSCULAR | Status: DC | PRN
  Administered 2016-05-11: 25 mL via PERINEURAL

## 2016-05-11 MED ORDER — DEXAMETHASONE SODIUM PHOSPHATE 10 MG/ML IJ SOLN
INTRAMUSCULAR | Status: DC | PRN
  Administered 2016-05-11: 5 mg via INTRAVENOUS

## 2016-05-11 MED ORDER — PROPOFOL 10 MG/ML IV BOLUS
INTRAVENOUS | Status: DC | PRN
  Administered 2016-05-11: 120 mg via INTRAVENOUS
  Administered 2016-05-11: 30 mg via INTRAVENOUS

## 2016-05-11 MED ORDER — ONDANSETRON HCL 4 MG/2ML IJ SOLN: 4.0000 mg | Freq: Four times a day (QID) | INTRAMUSCULAR | Status: DC | PRN

## 2016-05-11 MED ORDER — CEFAZOLIN SODIUM-DEXTROSE 2-4 GM/100ML-% IV SOLN
2.0000 g | INTRAVENOUS | Status: AC
  Administered 2016-05-11: 2 g via INTRAVENOUS
  Filled 2016-05-11: qty 100

## 2016-05-11 MED ORDER — CEFAZOLIN IN D5W 1 GM/50ML IV SOLN
1.0000 g | Freq: Four times a day (QID) | INTRAVENOUS | Status: AC
Start: 2016-05-11 — End: 2016-05-12
  Administered 2016-05-11 – 2016-05-12 (×3): 1 g via INTRAVENOUS
  Filled 2016-05-11 (×3): qty 50

## 2016-05-11 MED ORDER — PHENOL 1.4 % MT LIQD: 1.0000 | OROMUCOSAL | Status: DC | PRN

## 2016-05-11 MED ORDER — BISACODYL 10 MG RE SUPP: 10.0000 mg | Freq: Every day | RECTAL | Status: DC | PRN

## 2016-05-11 MED ORDER — PROMETHAZINE HCL 25 MG/ML IJ SOLN: 6.2500 mg | INTRAMUSCULAR | Status: DC | PRN

## 2016-05-11 MED ORDER — FENTANYL CITRATE (PF) 100 MCG/2ML IJ SOLN
INTRAMUSCULAR | Status: DC | PRN
  Administered 2016-05-11 (×2): 50 ug via INTRAVENOUS

## 2016-05-11 MED ORDER — ALPRAZOLAM 0.25 MG PO TABS: 0.2500 mg | ORAL_TABLET | Freq: Every evening | ORAL | Status: DC | PRN

## 2016-05-11 MED ORDER — FENTANYL CITRATE (PF) 100 MCG/2ML IJ SOLN
INTRAMUSCULAR | Status: AC
  Filled 2016-05-11: qty 2

## 2016-05-11 MED ORDER — FUROSEMIDE 40 MG PO TABS: 40.0000 mg | ORAL_TABLET | Freq: Every day | ORAL | Status: DC | PRN

## 2016-05-11 MED ORDER — LIDOCAINE HCL (CARDIAC) 20 MG/ML IV SOLN
INTRAVENOUS | Status: DC | PRN
  Administered 2016-05-11: 60 mg via INTRAVENOUS

## 2016-05-11 MED ORDER — VITAMIN D 1000 UNITS PO TABS
1000.0000 [IU] | ORAL_TABLET | Freq: Every day | ORAL | Status: DC
  Administered 2016-05-11 – 2016-05-13 (×3): 1000 [IU] via ORAL
  Filled 2016-05-11 (×3): qty 1

## 2016-05-11 MED ORDER — ROCURONIUM BROMIDE 100 MG/10ML IV SOLN: INTRAVENOUS | Status: DC | PRN

## 2016-05-11 MED ORDER — ROCURONIUM BROMIDE 100 MG/10ML IV SOLN
INTRAVENOUS | Status: DC | PRN
  Administered 2016-05-11: 40 mg via INTRAVENOUS

## 2016-05-11 MED ORDER — CALCIUM CARBONATE 1250 (500 CA) MG PO TABS
625.0000 mg | ORAL_TABLET | Freq: Every day | ORAL | Status: DC
  Administered 2016-05-12: 625 mg via ORAL
  Filled 2016-05-11 (×2): qty 1

## 2016-05-11 MED ORDER — MAGNESIUM CITRATE PO SOLN: 1.0000 | Freq: Once | ORAL | Status: DC | PRN

## 2016-05-11 MED ORDER — SCOPOLAMINE 1 MG/3DAYS TD PT72
MEDICATED_PATCH | TRANSDERMAL | Status: DC | PRN
  Administered 2016-05-11: 1 via TRANSDERMAL

## 2016-05-11 MED ORDER — METHOCARBAMOL 1000 MG/10ML IJ SOLN
500.0000 mg | Freq: Four times a day (QID) | INTRAMUSCULAR | Status: DC | PRN
  Filled 2016-05-11: qty 5

## 2016-05-11 MED ORDER — MENTHOL 3 MG MT LOZG: 1.0000 | LOZENGE | OROMUCOSAL | Status: DC | PRN

## 2016-05-11 MED ORDER — POLYETHYLENE GLYCOL 3350 17 G PO PACK: 17.0000 g | PACK | Freq: Every day | ORAL | Status: DC | PRN

## 2016-05-11 MED ORDER — PANTOPRAZOLE SODIUM 40 MG PO TBEC
80.0000 mg | DELAYED_RELEASE_TABLET | Freq: Every day | ORAL | Status: DC
  Administered 2016-05-11 – 2016-05-13 (×3): 80 mg via ORAL
  Filled 2016-05-11 (×3): qty 2

## 2016-05-11 MED ORDER — 0.9 % SODIUM CHLORIDE (POUR BTL) OPTIME
TOPICAL | Status: DC | PRN
  Administered 2016-05-11: 1000 mL

## 2016-05-11 MED ORDER — OXYCODONE HCL 5 MG PO TABS: 5.0000 mg | ORAL_TABLET | ORAL | 0 refills | Status: DC | PRN

## 2016-05-11 MED ORDER — ALUM & MAG HYDROXIDE-SIMETH 200-200-20 MG/5ML PO SUSP: 30.0000 mL | ORAL | Status: DC | PRN

## 2016-05-11 MED ORDER — BACLOFEN 10 MG PO TABS: 10.0000 mg | ORAL_TABLET | Freq: Three times a day (TID) | ORAL | 0 refills | Status: DC

## 2016-05-11 MED ORDER — SUGAMMADEX SODIUM 200 MG/2ML IV SOLN
INTRAVENOUS | Status: AC
  Filled 2016-05-11: qty 2

## 2016-05-11 SURGICAL SUPPLY — 68 items
BIT DRILL 5/64X5 DISP (BIT) ×2 IMPLANT
BIT DRILL F/CENTRAL SCRW 3.2 (BIT) ×1
BIT DRILL F/CENTRAL SCRW 3.2MM (BIT) IMPLANT
BIT DRILL TWIST 2.7 (BIT) ×1 IMPLANT
BLADE SAW SGTL MED 73X18.5 STR (BLADE) ×2 IMPLANT
CAPT SHLDR REVTOTAL 2 ×1 IMPLANT
CLSR STERI-STRIP ANTIMIC 1/2X4 (GAUZE/BANDAGES/DRESSINGS) ×2 IMPLANT
COVER SURGICAL LIGHT HANDLE (MISCELLANEOUS) ×2 IMPLANT
DRAPE ORTHO SPLIT 77X108 STRL (DRAPES) ×4
DRAPE PROXIMA HALF (DRAPES) ×2 IMPLANT
DRAPE SURG ORHT 6 SPLT 77X108 (DRAPES) ×2 IMPLANT
DRAPE U-SHAPE 47X51 STRL (DRAPES) ×2 IMPLANT
DRILL BIT F/CENTRAL SCRW 3.2MM (BIT) ×2
DRSG MEPILEX BORDER 4X8 (GAUZE/BANDAGES/DRESSINGS) ×2 IMPLANT
DURAPREP 26ML APPLICATOR (WOUND CARE) ×2 IMPLANT
ELECT REM PT RETURN 9FT ADLT (ELECTROSURGICAL) ×2
ELECTRODE REM PT RTRN 9FT ADLT (ELECTROSURGICAL) ×1 IMPLANT
EVACUATOR 1/8 PVC DRAIN (DRAIN) IMPLANT
GLOVE BIOGEL PI IND STRL 8 (GLOVE) ×1 IMPLANT
GLOVE BIOGEL PI INDICATOR 8 (GLOVE) ×1
GLOVE BIOGEL PI ORTHO PRO SZ8 (GLOVE) ×1
GLOVE ORTHO TXT STRL SZ7.5 (GLOVE) ×2 IMPLANT
GLOVE PI ORTHO PRO STRL SZ8 (GLOVE) ×1 IMPLANT
GLOVE SURG ORTHO 8.0 STRL STRW (GLOVE) ×4 IMPLANT
GOWN STRL REUS W/ TWL LRG LVL3 (GOWN DISPOSABLE) ×1 IMPLANT
GOWN STRL REUS W/ TWL XL LVL3 (GOWN DISPOSABLE) ×1 IMPLANT
GOWN STRL REUS W/TWL 2XL LVL3 (GOWN DISPOSABLE) ×2 IMPLANT
GOWN STRL REUS W/TWL LRG LVL3 (GOWN DISPOSABLE) ×2
GOWN STRL REUS W/TWL XL LVL3 (GOWN DISPOSABLE) ×2
HANDPIECE INTERPULSE COAX TIP (DISPOSABLE) ×2
HOOD PEEL AWAY FACE SHEILD DIS (HOOD) ×4 IMPLANT
KIT BASIN OR (CUSTOM PROCEDURE TRAY) ×2 IMPLANT
KIT ROOM TURNOVER OR (KITS) ×2 IMPLANT
MANIFOLD NEPTUNE II (INSTRUMENTS) ×2 IMPLANT
NDL 1/2 CIR CATGUT .05X1.09 (NEEDLE) IMPLANT
NDL HYPO 25GX1X1/2 BEV (NEEDLE) IMPLANT
NEEDLE 1/2 CIR CATGUT .05X1.09 (NEEDLE) IMPLANT
NEEDLE HYPO 25GX1X1/2 BEV (NEEDLE) IMPLANT
NS IRRIG 1000ML POUR BTL (IV SOLUTION) ×2 IMPLANT
PACK SHOULDER (CUSTOM PROCEDURE TRAY) ×2 IMPLANT
PAD ARMBOARD 7.5X6 YLW CONV (MISCELLANEOUS) ×4 IMPLANT
PIN STEINMANN THREADED TIP (PIN) ×1 IMPLANT
PIN THREADED REVERSE (PIN) ×1 IMPLANT
SET HNDPC FAN SPRY TIP SCT (DISPOSABLE) ×1 IMPLANT
SLING ARM IMMOBILIZER LRG (SOFTGOODS) IMPLANT
SLING ARM IMMOBILIZER MED (SOFTGOODS) ×1 IMPLANT
SMARTMIX MINI TOWER (MISCELLANEOUS) ×2
SPONGE LAP 18X18 X RAY DECT (DISPOSABLE) ×2 IMPLANT
STRIP CLOSURE SKIN 1/2X4 (GAUZE/BANDAGES/DRESSINGS) ×1 IMPLANT
SUCTION FRAZIER HANDLE 10FR (MISCELLANEOUS) ×1
SUCTION TUBE FRAZIER 10FR DISP (MISCELLANEOUS) ×1 IMPLANT
SUPPORT WRAP ARM LG (MISCELLANEOUS) ×2 IMPLANT
SUT FIBERWIRE #2 38 REV NDL BL (SUTURE)
SUT MAXBRAID (SUTURE) ×4 IMPLANT
SUT MNCRL AB 4-0 PS2 18 (SUTURE) IMPLANT
SUT VIC AB 0 CT1 27 (SUTURE) ×2
SUT VIC AB 0 CT1 27XBRD ANBCTR (SUTURE) ×1 IMPLANT
SUT VIC AB 2-0 CT1 27 (SUTURE)
SUT VIC AB 2-0 CT1 TAPERPNT 27 (SUTURE) IMPLANT
SUT VIC AB 3-0 SH 8-18 (SUTURE) ×2 IMPLANT
SUTURE FIBERWR#2 38 REV NDL BL (SUTURE) IMPLANT
SYR CONTROL 10ML LL (SYRINGE) IMPLANT
TOWEL OR 17X24 6PK STRL BLUE (TOWEL DISPOSABLE) ×2 IMPLANT
TOWEL OR 17X26 10 PK STRL BLUE (TOWEL DISPOSABLE) ×2 IMPLANT
TOWER SMARTMIX MINI (MISCELLANEOUS) ×1 IMPLANT
TUBE CONNECTING 12X1/4 (SUCTIONS) ×2 IMPLANT
WATER STERILE IRR 1000ML POUR (IV SOLUTION) ×2 IMPLANT
YANKAUER SUCT BULB TIP NO VENT (SUCTIONS) ×2 IMPLANT

## 2016-05-11 NOTE — Discharge Instructions (Addendum)
Diet: As you were doing prior to hospitalization   Shower:  May shower but keep the wounds dry, use an occlusive plastic wrap, NO SOAKING IN TUB.  If the bandage gets wet, change with a clean dry gauze.  If you have a splint on, leave the splint in place and keep the splint dry with a plastic bag.  Dressing:  You may change your dressing 3-5 days after surgery, unless you have a splint.  If you have a splint, then just leave the splint in place and we will change your bandages during your first follow-up appointment.    If you had hand or foot surgery, we will plan to remove your stitches in about 2 weeks in the office.  For all other surgeries, there are sticky tapes (steri-strips) on your wounds and all the stitches are absorbable.  Leave the steri-strips in place when changing your dressings, they will peel off with time, usually 2-3 weeks.  Activity:  Increase activity slowly as tolerated, but follow the weight bearing instructions below.  The rules on driving is that you can not be taking narcotics while you drive, and you must feel in control of the vehicle.    Weight Bearing:   Sling at all times except hygiene.    To prevent constipation: you may use a stool softener such as -  Colace (over the counter) 100 mg by mouth twice a day  Drink plenty of fluids (prune juice may be helpful) and high fiber foods Miralax (over the counter) for constipation as needed.    Itching:  If you experience itching with your medications, try taking only a single pain pill, or even half a pain pill at a time.  You may take up to 10 pain pills per day, and you can also use benadryl over the counter for itching or also to help with sleep.   Precautions:  If you experience chest pain or shortness of breath - call 911 immediately for transfer to the hospital emergency department!!  If you develop a fever greater that 101 F, purulent drainage from wound, increased redness or drainage from wound, or calf pain --  Call the office at [phone]                                                Follow- Up Appointment:  Please call for an appointment to be seen in 2 weeks [place] - [phone]  Information on my medicine - XARELTO (Rivaroxaban)  This medication education was reviewed with me or my healthcare representative as part of my discharge preparation.  The pharmacist that spoke with me during my hospital stay was:  Tad Moore, [facility]  Why was Xarelto prescribed for you? Xarelto was prescribed for you to reduce the risk of blood clots forming after orthopedic surgery. The medical term for these abnormal blood clots is venous thromboembolism (VTE).  What do you need to know about xarelto ? Take your Xarelto ONCE DAILY at the same time every day. You may take it either with or without food.  If you have difficulty swallowing the tablet whole, you may crush it and mix in applesauce just prior to taking your dose.  Take Xarelto exactly as prescribed by your doctor and DO NOT stop taking Xarelto without talking to the doctor who prescribed the medication.  Stopping without other  VTE prevention medication to take the place of Xarelto may increase your risk of developing a clot.  After discharge, you should have regular check-up appointments with your healthcare provider that is prescribing your Xarelto.    What do you do if you miss a dose? If you miss a dose, take it as soon as you remember on the same day then continue your regularly scheduled once daily regimen the next day. Do not take two doses of Xarelto on the same day.   Important Safety Information A possible side effect of Xarelto is bleeding. You should call your healthcare provider right away if you experience any of the following: ? Bleeding from an injury or your nose that does not stop. ? Unusual colored urine (red or dark brown) or unusual colored stools (red or black). ? Unusual bruising for unknown reasons. ? A  serious fall or if you hit your head (even if there is no bleeding).  Some medicines may interact with Xarelto and might increase your risk of bleeding while on Xarelto. To help avoid this, consult your healthcare provider or pharmacist prior to using any new prescription or non-prescription medications, including herbals, vitamins, non-steroidal anti-inflammatory drugs (NSAIDs) and supplements.  This website has more information on Xarelto: [email or web address]

## 2016-05-11 NOTE — Anesthesia Procedure Notes (Addendum)
Procedure Name: Intubation Date/Time: 05/11/2016 7:46 AM Performed by: Salli Quarry Karsyn Rochin Pre-anesthesia Checklist: Patient identified, Emergency Drugs available, Suction available and Patient being monitored Patient Re-evaluated:Patient Re-evaluated prior to inductionOxygen Delivery Method: Circle System Utilized Preoxygenation: Pre-oxygenation with 100% oxygen Intubation Type: IV induction Ventilation: Mask ventilation without difficulty Laryngoscope Size: Mac and 3 Grade View: Grade I Tube type: Oral Tube size: 7.0 mm Number of attempts: 1 Airway Equipment and Method: Stylet Placement Confirmation: ETT inserted through vocal cords under direct vision,  positive ETCO2 and breath sounds checked- equal and bilateral Secured at: 21 cm Tube secured with: Tape Dental Injury: Teeth and Oropharynx as per pre-operative assessment

## 2016-05-11 NOTE — Op Note (Signed)
05/11/2016  10:06 AM  PATIENT:  Kathy [name]    PRE-OPERATIVE DIAGNOSIS:  RIGHT SHOULDER ROTATOR CUFF ARTHROPATHY  POST-OPERATIVE DIAGNOSIS:  Same  PROCEDURE:  RIGHT REVERSE SHOULDER ARTHROPLASTY  SURGEON:  [name], MD  PHYSICIAN ASSISTANT: Joya Gaskins, OPA-C, present and scrubbed throughout the case, critical for completion in a timely fashion, and for retraction, instrumentation, and closure.  ANESTHESIA:   General  PREOPERATIVE INDICATIONS:  Kathy [name] is a  75 y.o. female with a diagnosis of [place] who failed conservative measures and elected for surgical management.    The risks benefits and alternatives were discussed with the patient preoperatively including but not limited to the risks of infection, bleeding, nerve injury, cardiopulmonary complications, the need for revision surgery, dislocation, brachial plexus palsy, incomplete relief of pain, among others, and the patient was willing to proceed.  OPERATIVE IMPLANTS: Biomet size 10 humeral stem press-fit standard with a 44 mm reverse shoulder arthroplasty tray with a standard liner and a 36 mm glenosphere with a mini baseplate and 32 locking screws and one central nonlocking screw.  OPERATIVE FINDINGS: Advanced rotator cuff arthropathy with acetabular position of the undersurface of the acromion, superior glenoid wear with erosion and mild sclerosis, with complete loss of supraspinatus as well as infraspinatus tissue. The biceps tendon was not within the groove, and appeared to have chronic destruction. Subscapularis was able to be repaired, her shoulder was extremely tight to begin with, and afterwards I was able to get her externally rotated to about 5 even with the subscapularis repair.  There was somewhat of a mismatch between the diaphyseal size and the metaphyseal size, as it was extremely difficult to get the 10 implant down, but it was not blocked by the metaphysis, but  rather by the diaphysis. I even after trying to place the real implant I had to remove it twice in order to re-prepare, and re-reamed, and re-broach the diaphysis. I even had to broach up to a size 11 in order to get the 10 mm real implant down low enough for implantation and reduction.  OPERATIVE PROCEDURE: The patient was brought to the operating room and placed in the supine position. General anesthesia was administered. IV antibiotics were given. Time out was performed. The upper extremity was prepped and draped in usual sterile fashion. The patient was in a beachchair position. Deltopectoral approach was carried out. The biceps tendon was not helpful, and was not tenodesis.  I then performed circumferential releases of the humerus, and then dislocated the head, and then reamed with the reamer to the above named size. The top of the humeral head was so sclerotic that I had opened with the drill.  I then applied the jig, and cut the humeral head in 30 of retroversion, and then turned my attention to the glenoid.  Deep retractors were placed, and I resected the labrum, and then placed a guidepin into the center position on the glenoid, with slight inferior inclination. I then reamed over the guidepin, and this created a small metaphyseal cancellus blush inferiorly, getting just to the eburnated bone superiorly. I did not want to take any more bone, I had to remove a fair amount inferiorly in order to get appropriate inclination.The base plate was selected and impacted place, and then I secured it centrally with a nonlocking screw, and I had excellent purchase both inferiorly and superiorly. I placed a short locking anteriorly with excellent fixation.   I then turned my attention  to the glenosphere, and impacted this into place, placing slight inferior offset (set on B).   The glenosphere was completely seated, and had engagement of the [facility] taper. I then turned my attention back to the humerus.  I  sequentially broached, and then trialed, and was found to restore soft tissue tension, and it had slightly more than 2 finger tightness. Therefore the above named components were selected. The shoulder felt stable throughout functional motion.  Before I placed the real prosthesis I had also placed a total of 3 #2 FiberWire through drill holes in the humerus for later subscapularis repair.  I then impacted the real prosthesis into place, as well as the real humeral tray, and reduced the shoulder. Initially however I was unable to reduce the shoulder, and the tray was still sitting fairly proud, and the 10 prosthesis did not fully seat. I ended up removing the components, and then re-broaching and re-placing the components, which still didn't go down deep enough, so I did it a third time preparing with an 11 broach and an 11 reamer, which finally accommodated the size 10 stem. Stem was extremely stable, and I was able to reduce it with the tray with appropriate soft tissue tension. The shoulder had satisfactory motion, 0-110 forward flexion, with external rotation to 5 and was stable, and I irrigated the wounds copiously.    I then used these to repair the subscapularis. This came down to bone.  I then irrigated the shoulder copiously once more, repaired the deltopectoral interval with Vicryl followed by subcutaneous Vicryl with Steri-Strips and sterile gauze for the skin. The patient was awakened and returned back in stable and satisfactory condition. There no complications and She tolerated the procedure well.

## 2016-05-11 NOTE — Anesthesia Procedure Notes (Signed)
Anesthesia Regional Block:  Interscalene brachial plexus block  Pre-Anesthetic Checklist: ,, timeout performed, Correct Patient, Correct Site, Correct Laterality, Correct Procedure, Correct Position, site marked, Risks and benefits discussed,  Surgical consent,  Pre-op evaluation,  At surgeon's request and post-op pain management  Laterality: Right  Prep: chloraprep       Needles:  Injection technique: Single-shot  Needle Type: Echogenic Needle     Needle Length: 9cm 9 cm Needle Gauge: 21 G    Additional Needles:  Procedures: ultrasound guided (picture in chart) Interscalene brachial plexus block Narrative:  Injection made incrementally with aspirations every 5 mL.  Performed by: Personally  Anesthesiologist: Murry Diaz  Additional Notes: Patient tolerated the procedure well without complications

## 2016-05-11 NOTE — Anesthesia Preprocedure Evaluation (Signed)
Anesthesia Evaluation  Patient identified by MRN, date of birth, ID band Patient awake    Reviewed: Allergy & Precautions, NPO status , Patient's Chart, lab work & pertinent test results  Airway Mallampati: II  TM Distance: >3 FB Neck ROM: Full    Dental no notable dental hx.    Pulmonary neg pulmonary ROS,    Pulmonary exam normal breath sounds clear to auscultation       Cardiovascular hypertension, Normal cardiovascular exam Rhythm:Regular Rate:Normal     Neuro/Psych negative neurological ROS  negative psych ROS   GI/Hepatic negative GI ROS, Neg liver ROS,   Endo/Other  Hypothyroidism   Renal/GU negative Renal ROS  negative genitourinary   Musculoskeletal negative musculoskeletal ROS (+)   Abdominal   Peds negative pediatric ROS (+)  Hematology negative hematology ROS (+)   Anesthesia Other Findings   Reproductive/Obstetrics negative OB ROS                             Anesthesia Physical Anesthesia Plan  ASA: II  Anesthesia Plan: General   Post-op Pain Management: GA combined w/ Regional for post-op pain   Induction: Intravenous  Airway Management Planned: Oral ETT  Additional Equipment:   Intra-op Plan:   Post-operative Plan: Extubation in OR  Informed Consent: I have reviewed the patients History and Physical, chart, labs and discussed the procedure including the risks, benefits and alternatives for the proposed anesthesia with the patient or authorized representative who has indicated his/her understanding and acceptance.   Dental advisory given  Plan Discussed with: CRNA and Surgeon  Anesthesia Plan Comments:         Anesthesia Quick Evaluation

## 2016-05-11 NOTE — H&P (Signed)
PREOPERATIVE H&P  Chief Complaint: RIGHT SHOULDER pain  HPI: Kathy [name] is a 75 y.o. female who presents for preoperative history and physical with a diagnosis of RC arthropathy RIGHT SHOULDER. Symptoms are rated as moderate to severe, and have been worsening.  This is significantly impairing activities of daily living.  She has elected for surgical management.   She has failed injections, activity modification, anti-inflammatories, and assistive devices.  Preoperative X-rays demonstrate end stage degenerative changes with osteophyte formation, loss of joint space, subchondral sclerosis.   Past Medical History:  Diagnosis Date  . Anxiety    takes Alprazolam daily as needed  . Arthritis   . Diverticulosis   . Headache(784.0)    occasionally  . History of blood transfusion    no abnormal reaction noted  . History of blood transfusion    no abnormal reaction noted  . History of bronchitis    last time many yrs ago  . Hyperlipidemia   . Hypertension    takes Amlodipine daily  . Hypothyroid   . Hypothyroidism    takes Synthroid daily  . Joint pain   . Nocturia   . Numbness    occasionally left arm at night  . Osteoporosis    takes Fosamax weekly  . Peripheral edema    takes Lasix daily as needed  . PONV (postoperative nausea and vomiting)    Past Surgical History:  Procedure Laterality Date  . ABDOMINAL HYSTERECTOMY    . cataract surgery Bilateral   . CHOLECYSTECTOMY    . COLONOSCOPY    . gastric bypass surgery    . JOINT REPLACEMENT Bilateral    hip  . REVISION TOTAL HIP ARTHROPLASTY Left 10/03/2013   DR Mayer Camel  . TOTAL HIP REVISION Left 10/03/2013   Procedure: TOTAL HIP REVISION- left;  Surgeon: Kerin Salen, MD;  Location: [facility];  Service: Orthopedics;  Laterality: Left;  . TOTAL THYROIDECTOMY     Social History   Social History  . Marital status: Divorced    Spouse name: N/A  . Number of children: N/A  . Years of education: N/A   Social History Main  Topics  . Smoking status: Never Smoker  . Smokeless tobacco: Never Used  . Alcohol use No  . Drug use: No  . Sexual activity: Not Asked   Other Topics Concern  . None   Social History Narrative  . None   History reviewed. No pertinent family history. Allergies  Allergen Reactions  . Aspirin Other (See Comments)    Adult Aspirin: upset stomach / irritates ulcer    Prior to Admission medications   Medication Sig Start Date End Date Taking? Authorizing Provider  acetaminophen (TYLENOL) 325 MG tablet Take 650 mg by mouth every 6 (six) hours as needed.   Yes Historical Provider, MD  alendronate (FOSAMAX) 70 MG tablet Take 70 mg by mouth every Wednesday. Take with a full glass of water on an empty stomach.   Yes Historical Provider, MD  amLODipine (NORVASC) 5 MG tablet Take 5 mg by mouth daily as needed (when blood pressure gets high).   Yes Historical Provider, MD  aspirin EC 81 MG tablet Take 81 mg by mouth daily.   Yes Historical Provider, MD  calcium carbonate 1250 MG capsule Take 300-600 mg by mouth See admin instructions. 600 mg in the morning, 300 mg in the afternoon, and 600 mg at bedtime   Yes Historical Provider, MD  Cholecalciferol 1000 UNITS TBDP Take 1,000 Units by  mouth 2 (two) times daily.   Yes Historical Provider, MD  diclofenac sodium (VOLTAREN) 1 % GEL Apply 2 g topically 2 (two) times daily as needed (for pain in knees and shoulder).   Yes Historical Provider, MD  furosemide (LASIX) 40 MG tablet Take 40 mg by mouth daily as needed for fluid.    Yes Historical Provider, MD  meloxicam (MOBIC) 15 MG tablet Take 15 mg by mouth at bedtime.   Yes Historical Provider, MD  Multiple Vitamin (MULTIVITAMIN WITH MINERALS) TABS tablet Take 1 tablet by mouth daily.    Yes Historical Provider, MD  naproxen sodium (ANAPROX) 220 MG tablet Take 220 mg by mouth every morning.   Yes Historical Provider, MD  Omega-3 Fatty Acids (FISH OIL) 1000 MG CAPS Take 1,000 mg by mouth daily.   Yes  Historical Provider, MD  omeprazole (PRILOSEC) 20 MG capsule Take 20 mg by mouth daily as needed (for acid reflux and heartburn).    Yes Historical Provider, MD  SYNTHROID 88 MCG tablet Take 88 mcg by mouth daily before breakfast.  03/08/16  Yes Historical Provider, MD  traMADol (ULTRAM) 50 MG tablet Take 50 mg by mouth every 6 (six) hours as needed for moderate pain.    Yes Historical Provider, MD  ALPRAZolam Duanne Moron) 0.25 MG tablet Take 0.25 mg by mouth at bedtime as needed for anxiety or sleep.    Historical Provider, MD  loratadine (CLARITIN) 10 MG tablet Take 10 mg by mouth daily as needed for allergies.     Historical Provider, MD     Positive ROS: All other systems have been reviewed and were otherwise negative with the exception of those mentioned in the HPI and as above.  Physical Exam: General: Alert, no acute distress Cardiovascular: No pedal edema Respiratory: No cyanosis, no use of accessory musculature GI: No organomegaly, abdomen is soft and non-tender Skin: No lesions in the area of chief complaint Neurologic: Sensation intact distally Psychiatric: Patient is competent for consent with normal mood and affect Lymphatic: No axillary or cervical lymphadenopathy  MUSCULOSKELETAL: AROM 0-50, -10ER, painful weak arc of right shoulder motion  Assessment: RC arthropathy RIGHT SHOULDER   Plan: Plan for Procedure(s): RIGHT TOTAL SHOULDER ARTHROPLASTY reverse  The risks benefits and alternatives were discussed with the patient including but not limited to the risks of nonoperative treatment, versus surgical intervention including infection, bleeding, nerve injury,  blood clots, cardiopulmonary complications, morbidity, mortality, among others, and they were willing to proceed.   [name], MD Cell [phone]   05/11/2016 7:25 AM

## 2016-05-11 NOTE — Transfer of Care (Signed)
Immediate Anesthesia Transfer of Care Note  Patient: Kathy [name]  Procedure(s) Performed: Procedure(s): REVERSE SHOULDER ARTHROPLASTY (Right)  Patient Location: PACU  Anesthesia Type:GA combined with regional for post-op pain  Level of Consciousness: awake, alert , oriented and patient cooperative  Airway & Oxygen Therapy: Patient Spontanous Breathing and Patient connected to nasal cannula oxygen  Post-op Assessment: Report given to RN and Post -op Vital signs reviewed and stable  Post vital signs: Reviewed and stable  Last Vitals:  Vitals:   05/11/16 0546 05/11/16 1025  BP: 128/70 116/72  Pulse: 74 73  Resp: 20 12  Temp: 36.8 C 36.6 C    Last Pain:  Vitals:   05/11/16 0546  TempSrc: Oral         Complications: No apparent anesthesia complications

## 2016-05-11 NOTE — OR Nursing (Signed)
Ms. Heitzman was transported to 5N23 via bed and Oxygen.  She is awake, oriented x4 with an effective nerve block to her Right shoulder and has no pain at this time.  Handoff report given to [facility].

## 2016-05-11 NOTE — Evaluation (Signed)
Physical Therapy Evaluation Patient Details Name: Kathy [name] MRN: [id number] DOB: [birth year] Today's Date: 05/11/2016   History of Present Illness  Kathy [name] is a 75 y.o. female who presents for preoperative history and physical with a diagnosis of RC arthropathy RIGHT SHOULDER. Symptoms are rated as moderate to severe, and have been worsening.  pt for right reverse s houlder arthoplasty.  pt has had both hips replaced.  Clinical Impression  Motivated lady with significant arthritis all over. She has great attitude on getting better. She did well getting up.  She will benefit from SNF stay to get as independent as possible as she lives alone.    Follow Up Recommendations SNF;Supervision/Assistance - 24 hour    Equipment Recommendations   (pt may benefit from hemiwalker or quad cane - will assess)    Recommendations for Other Services       Precautions / Restrictions Precautions Precautions: Fall;Shoulder Shoulder Interventions: Shoulder sling/immobilizer      Mobility  Bed Mobility Overal bed mobility: Needs Assistance Bed Mobility: Supine to Sit     Supine to sit: HOB elevated;Mod assist        Transfers Overall transfer level: Needs assistance Equipment used: 1 person hand held assist Transfers: Sit to/from Stand;Stand Pivot Transfers Sit to Stand: Min assist Stand pivot transfers: Min assist       General transfer comment: pt walked 3 feet to [facility] then stood and walked 3 feet to recliner - all with HH min assist  Ambulation/Gait Ambulation/Gait assistance: Min assist Ambulation Distance (Feet): 3 Feet Assistive device: 1 person hand held assist          Stairs            Wheelchair Mobility    Modified Rankin (Stroke Patients Only)       Balance Overall balance assessment: No apparent balance deficits (not formally assessed)                                           Pertinent Vitals/Pain Pain Assessment:  0-10 Pain Score: 4  Pain Location: no pain in right shoulder - pt has pain in lft shoulder - chronic    Home Living Family/patient expects to be discharged to:: Skilled nursing facility                 Additional Comments: pt hoping to be DCed to [facility]    Prior Function Level of Independence: Independent with assistive device(s)         Comments: pt lives alone.  Takes SCAT or has friend to take her places.  pt uses RW to walk.  Pt denies history of falls     Hand Dominance        Extremity/Trunk Assessment               Lower Extremity Assessment: Generalized weakness (pt with bad knees - she says they may be replaced one day.  pt with genu valgus both legs)      Cervical / Trunk Assessment: Normal  Communication   Communication: No difficulties  Cognition Arousal/Alertness: Awake/alert Behavior During Therapy: WFL for tasks assessed/performed Overall Cognitive Status: Within Functional Limits for tasks assessed                      General Comments      Exercises  Assessment/Plan    PT Assessment Patient needs continued PT services  PT Problem List Decreased activity tolerance;Decreased mobility;Decreased knowledge of use of DME;Decreased safety awareness;Decreased knowledge of precautions          PT Treatment Interventions Gait training;DME instruction;Functional mobility training;Therapeutic activities;Therapeutic exercise;Patient/family education    PT Goals (Current goals can be found in the Care Plan section)  Acute Rehab PT Goals Patient Stated Goal: to go to rehab until independent enough to go home PT Goal Formulation: With patient Time For Goal Achievement: 05/25/16 Potential to Achieve Goals: Good    Frequency Min 4X/week   Barriers to discharge Inaccessible home environment;Decreased caregiver support      Co-evaluation               End of Session Equipment Utilized During Treatment: Gait  belt Activity Tolerance: Patient tolerated treatment well Patient left: in chair;with call bell/phone within reach Nurse Communication: Mobility status (talked with nursing tech)         Time: 1700-1740 PT Time Calculation (min) (ACUTE ONLY): 40 min   Charges:   PT Evaluation $PT Eval Low Complexity: 1 Procedure PT Treatments $Therapeutic Activity: 8-22 mins   PT G Codes:        Loyal Buba 05/11/2016, 5:50 PM 05/11/2016   Rande Lawman, PT

## 2016-05-11 NOTE — Anesthesia Postprocedure Evaluation (Signed)
Anesthesia Post Note  Patient: Kathy [name]  Procedure(s) Performed: Procedure(s) (LRB): REVERSE SHOULDER ARTHROPLASTY (Right)  Patient location during evaluation: PACU Anesthesia Type: General and Regional Level of consciousness: awake and alert Pain management: pain level controlled Vital Signs Assessment: post-procedure vital signs reviewed and stable Respiratory status: spontaneous breathing, nonlabored ventilation, respiratory function stable and patient connected to nasal cannula oxygen Cardiovascular status: blood pressure returned to baseline and stable Postop Assessment: no signs of nausea or vomiting Anesthetic complications: no    Last Vitals:  Vitals:   05/11/16 0546 05/11/16 1025  BP: 128/70 116/72  Pulse: 74 73  Resp: 20 12  Temp: 36.8 C 36.6 C    Last Pain:  Vitals:   05/11/16 0546  TempSrc: Oral                 Margi Edmundson S

## 2016-05-12 ENCOUNTER — Encounter (HOSPITAL_COMMUNITY): Payer: Self-pay | Admitting: Orthopedic Surgery

## 2016-05-12 LAB — CBC
HCT: 28.2 % — ABNORMAL LOW (ref 36.0–46.0)
Hemoglobin: 9.5 g/dL — ABNORMAL LOW (ref 12.0–15.0)
MCH: 31.4 pg (ref 26.0–34.0)
MCHC: 33.7 g/dL (ref 30.0–36.0)
MCV: 93.1 fL (ref 78.0–100.0)
Platelets: 202 10*3/uL (ref 150–400)
RBC: 3.03 MIL/uL — ABNORMAL LOW (ref 3.87–5.11)
RDW: 13.8 % (ref 11.5–15.5)
WBC: 5.8 10*3/uL (ref 4.0–10.5)

## 2016-05-12 LAB — BASIC METABOLIC PANEL
Anion gap: 8 (ref 5–15)
BUN: 12 mg/dL (ref 6–20)
CO2: 27 mmol/L (ref 22–32)
Calcium: 8 mg/dL — ABNORMAL LOW (ref 8.9–10.3)
Chloride: 104 mmol/L (ref 101–111)
Creatinine, Ser: 0.66 mg/dL (ref 0.44–1.00)
GFR calc Af Amer: >60 mL/min (ref >60)
GFR calc non Af Amer: >60 mL/min (ref >60)
Glucose, Bld: 103 mg/dL — ABNORMAL HIGH (ref 65–99)
Potassium: 3.4 mmol/L — ABNORMAL LOW (ref 3.5–5.1)
Sodium: 139 mmol/L (ref 135–145)

## 2016-05-12 NOTE — Clinical Social Work Note (Signed)
Clinical Social Work Assessment  Patient Details  Name: Kathy [name] MRN: [id number] Date of Birth: Feb 20, 1941  Date of referral:  05/12/16               Reason for consult:  Facility Placement                Permission sought to share information with:  Case Manager, Chartered certified accountant granted to share information::  Yes, Verbal Permission Granted  Name::        Agency::   (Agreeable to SNF search of [place] with preference to [facility])  Relationship::     Contact Information:     Housing/Transportation Living arrangements for the past 2 months:  [place] of Information:  Patient Patient Interpreter Needed:  None Criminal Activity/Legal Involvement Pertinent to Current Situation/Hospitalization:  No - Comment as needed Significant Relationships:  Siblings Lives with:  Self Do you feel safe going back to the place where you live?  No Need for family participation in patient care:  No (Coment)  Care giving concerns:  No care givers present at time of this assessment.   Social Worker assessment / plan:  CSW met with patient at bedside to discuss disposition. Patient states she is from home alone.  Patient states she has hx of hip surgery and went to [facility] for STR at that time and would like to return for STR this time.  Patient is also requesting PTAR transportation.  Employment status:  Retired Forensic scientist:  [employer], [employer]) PT Recommendations:  [place] / Referral to community resources:  [place]  Patient/Family's Response to care:  Patient is agreeable to SNF.  Patient/Family's Understanding of and Emotional Response to Diagnosis, Current Treatment, and Prognosis:  Patient expressed a sense of readiness regarding transition to SNF for STR.  Patient states "this time is a lot easier because I know what to expect."  Emotional  Assessment Appearance:  Appears stated age Attitude/Demeanor/Rapport:    Affect (typically observed):  Accepting, Adaptable, Pleasant Orientation:  Oriented to Self, Oriented to Place, Oriented to  Time, Oriented to Situation Alcohol / Substance use:  Not Applicable Psych involvement (Current and /or in the community):  No (Comment)  Discharge Needs  Concerns to be addressed:  No discharge needs identified Readmission within the last 30 days:  No Current discharge risk:  None Barriers to Discharge:  Continued Medical Work up, [place], [place], LCSW 05/12/2016, 11:26 AM

## 2016-05-12 NOTE — NC FL2 (Signed)
[place] MEDICAID FL2 LEVEL OF CARE SCREENING TOOL     IDENTIFICATION  Patient Name: Kathy [name] Birthdate: 10-02-1940 Sex: female Admission Date (Current Location): 05/11/2016  [facility] and [place] Number:  Herbalist and Address:  [facility] [address], [place], [place] [address]      Provider Number: [id number]  Attending Physician Name and Address:  Marchia Bond, MD  Relative Name and Phone Number:       Current Level of Care: Hospital Recommended Level of Care: [place] Prior Approval Number:    Date Approved/Denied:   PASRR Number: [id number]  Discharge Plan: SNF    Current Diagnoses: Patient Active Problem List   Diagnosis Date Noted  . 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 Allergic rhinitis 10/19/2013  . GERD (gastroesophageal reflux disease) 10/19/2013  . Anemia 10/19/2013  . Osteoporosis   . Hypertension   . Hypothyroid   . Hyperlipidemia   . Anxiety   . 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    Orientation RESPIRATION BLADDER Height & Weight     Self, Time, Situation, Place  Normal Continent Weight: 156 lb (70.8 kg) Height:     BEHAVIORAL SYMPTOMS/MOOD NEUROLOGICAL BOWEL NUTRITION STATUS      Continent Diet  AMBULATORY STATUS COMMUNICATION OF NEEDS Skin   Supervision Verbally Surgical wounds                       Personal Care Assistance Level of Assistance  Bathing, Dressing Bathing Assistance: Limited assistance   Dressing Assistance: Limited assistance     Functional Limitations Info             SPECIAL CARE FACTORS FREQUENCY  PT (By licensed PT), OT (By licensed OT)     PT Frequency: daily OT Frequency: daily            Contractures Contractures Info: Not present    Additional Factors Info  Allergies   Allergies Info: asprin           Current Medications (05/12/2016):  This is the current hospital  active medication list Current Facility-Administered Medications  Medication Dose Route Frequency Provider Last Rate Last Dose  . 0.45 % NaCl with KCl 20 mEq / L infusion   Intravenous Continuous Marchia Bond, MD 75 mL/hr at 05/11/16 1651    . acetaminophen (TYLENOL) tablet 650 mg  650 mg Oral Q6H PRN Marchia Bond, MD       Or  . acetaminophen (TYLENOL) suppository 650 mg  650 mg Rectal Q6H PRN Marchia Bond, MD      . ALPRAZolam Duanne Moron) tablet 0.25 mg  0.25 mg Oral QHS PRN Marchia Bond, MD      . alum & mag hydroxide-simeth (MAALOX/MYLANTA) 200-200-20 MG/5ML suspension 30 mL  30 mL Oral Q4H PRN Marchia Bond, MD      . amLODipine (NORVASC) tablet 5 mg  5 mg Oral Daily PRN Marchia Bond, MD      . bisacodyl (DULCOLAX) suppository 10 mg  10 mg Rectal Daily PRN Marchia Bond, MD      . calcium carbonate (OS-CAL - dosed in mg of elemental calcium) tablet 1,250 mg  1,250 mg Oral BID Marchia Bond, MD   1,250 mg at 05/12/16 0836  . calcium carbonate (OS-CAL - dosed in mg of elemental calcium) tablet 625 mg  625 mg Oral Q1200 Marchia Bond, MD      .  cholecalciferol (VITAMIN D) tablet 1,000 Units  1,000 Units Oral Daily Marchia Bond, MD   1,000 Units at 05/12/16 0836  . diphenhydrAMINE (BENADRYL) 12.5 MG/5ML elixir 12.5-25 mg  12.5-25 mg Oral Q4H PRN Marchia Bond, MD      . docusate sodium (COLACE) capsule 100 mg  100 mg Oral BID Marchia Bond, MD   100 mg at 05/12/16 [id number]  . furosemide (LASIX) tablet 40 mg  40 mg Oral Daily PRN Marchia Bond, MD      . HYDROmorphone (DILAUDID) injection 0.5 mg  0.5 mg Intravenous Q2H PRN Marchia Bond, MD      . levothyroxine (SYNTHROID, LEVOTHROID) tablet 88 mcg  88 mcg Oral QAC breakfast Marchia Bond, MD   88 mcg at 05/12/16 0800  . loratadine (CLARITIN) tablet 10 mg  10 mg Oral Daily PRN Marchia Bond, MD      . magnesium citrate solution 1 Bottle  1 Bottle Oral Once PRN Marchia Bond, MD      . menthol-cetylpyridinium (CEPACOL) lozenge 3 mg  1 lozenge Oral  PRN Marchia Bond, MD       Or  . phenol (CHLORASEPTIC) mouth spray 1 spray  1 spray Mouth/Throat PRN Marchia Bond, MD      . methocarbamol (ROBAXIN) tablet 500 mg  500 mg Oral Q6H PRN Marchia Bond, MD   500 mg at 05/12/16 0544   Or  . methocarbamol (ROBAXIN) 500 mg in dextrose 5 % 50 mL IVPB  500 mg Intravenous Q6H PRN Marchia Bond, MD      . metoCLOPramide (REGLAN) tablet 5-10 mg  5-10 mg Oral Q8H PRN Marchia Bond, MD       Or  . metoCLOPramide (REGLAN) injection 5-10 mg  5-10 mg Intravenous Q8H PRN Marchia Bond, MD      . multivitamin with minerals tablet 1 tablet  1 tablet Oral Daily Marchia Bond, MD   1 tablet at 05/12/16 0835  . ondansetron (ZOFRAN) tablet 4 mg  4 mg Oral Q6H PRN Marchia Bond, MD       Or  . ondansetron [facility]) injection 4 mg  4 mg Intravenous Q6H PRN Marchia Bond, MD      . oxyCODONE (Oxy IR/ROXICODONE) immediate release tablet 5-10 mg  5-10 mg Oral Q3H PRN Marchia Bond, MD   10 mg at 05/12/16 1006  . pantoprazole (PROTONIX) EC tablet 80 mg  80 mg Oral Daily Marchia Bond, MD   80 mg at 05/12/16 0836  . polyethylene glycol (MIRALAX / GLYCOLAX) packet 17 g  17 g Oral Daily PRN Marchia Bond, MD      . rivaroxaban Alveda Reasons) tablet 10 mg  10 mg Oral q1800 Marchia Bond, MD   10 mg at 05/11/16 1653  . senna (SENOKOT) tablet 8.6 mg  1 tablet Oral BID Marchia Bond, MD   8.6 mg at 05/12/16 0835  . traMADol (ULTRAM) tablet 50 mg  50 mg Oral Q6H PRN Marchia Bond, MD         Discharge Medications: Please see discharge summary for a list of discharge medications.  Relevant Imaging Results:  Relevant Lab Results:   Additional Information SSN: [id number]  Dulcy Fanny, LCSW

## 2016-05-12 NOTE — Clinical Social Work Placement (Signed)
   CLINICAL SOCIAL WORK PLACEMENT  NOTE  Date:  05/12/2016  Patient Details  Name: Kathy [name] MRN: [id number] Date of Birth: [birth year]  Clinical Social Work is seeking post-discharge placement for this patient at the [place] level of care (*CSW will initial, date and re-position this form in  chart as items are completed):  Yes   Patient/family provided with [place] Work Department's list of facilities offering this level of care within the geographic area requested by the patient (or if unable, by the patient's family).  Yes   Patient/family informed of their freedom to choose among providers that offer the needed level of care, that participate in Medicare, Medicaid or managed care program needed by the patient, have an available bed and are willing to accept the patient.  Yes   Patient/family informed of [facility]'s ownership interest in [facility] and [facility], as well as of the fact that they are under no obligation to receive care at these facilities.  PASRR submitted to EDS on       PASRR number received on       Existing PASRR number confirmed on 05/12/16     FL2 transmitted to all facilities in geographic area requested by pt/family on 05/12/16     FL2 transmitted to all facilities within larger geographic area on       Patient informed that his/her managed care company has contracts with or will negotiate with certain facilities, including the following:        Yes   Patient/family informed of bed offers received.  Patient chooses bed at       Physician recommends and patient chooses bed at      Patient to be transferred to   on  .  Patient to be transferred to facility by       Patient family notified on   of transfer.  Name of family member notified:        PHYSICIAN       Additional Comment:    _______________________________________________ Dulcy Fanny, LCSW 05/12/2016, 11:28 AM

## 2016-05-12 NOTE — Progress Notes (Signed)
Patient ID: Ameirah Huffine Stair, female   DOB: November 15, 1940, 75 y.o.   MRN: [id number]     Subjective:  Patient reports pain as mild.  Patient in bed and in no acute distress denies any CP or SOB  Objective:   VITALS:   Vitals:   05/11/16 1822 05/11/16 1939 05/12/16 0540 05/12/16 1417  BP: (!) 102/58 (!) 91/50 115/72 92/77  Pulse: 73 78 68 70  Resp: 18 18 18 18   Temp: 98.4 F (36.9 C) 99.1 F (37.3 C) 98.5 F (36.9 C) 99.5 F (37.5 C)  TempSrc: Oral Oral Oral Oral  SpO2: 100% 100% 100% 100%  Weight:        ABD soft Sensation intact distally Dorsiflexion/Plantar flexion intact Incision: dressing C/D/I and no drainage Good foot and ankle motion  Lab Results  Component Value Date   WBC 5.8 05/12/2016   HGB 9.5 (L) 05/12/2016   HCT 28.2 (L) 05/12/2016   MCV 93.1 05/12/2016   PLT 202 05/12/2016   BMET    Component Value Date/Time   NA 139 05/12/2016 0303   K 3.4 (L) 05/12/2016 0303   CL 104 05/12/2016 0303   CO2 27 05/12/2016 0303   GLUCOSE 103 (H) 05/12/2016 0303   BUN 12 05/12/2016 0303   CREATININE 0.66 05/12/2016 0303   CALCIUM 8.0 (L) 05/12/2016 0303   GFRNONAA >60 05/12/2016 0303   GFRAA >60 05/12/2016 0303     Assessment/Plan: 1 Day Post-Op   Principal Problem:   Rotator cuff arthropathy, right Active Problems:   S/P shoulder replacement   Advance diet Up with therapy Plan for discharge tomorrow Continue sling at all times    Remonia Richter 05/12/2016, 3:26 PM  Seen and agree with above.   Marchia Bond, MD Cell [phone]

## 2016-05-12 NOTE — Evaluation (Signed)
Occupational Therapy Evaluation Patient Details Name: Kathy [name] MRN: [id number] DOB: 1940-08-08 Today's Date: 05/12/2016    History of Present Illness "Kathy [name]" is a 75 y.o. female who presents for preoperative history and physical with a diagnosis of RC arthropathy RIGHT SHOULDER. Symptoms are rated as moderate to severe, and have been worsening.  pt for right reverse s houlder arthoplasty.  pt has had both hips replaced.   Clinical Impression   PTA Pt independent in ADL and used a RW for mobility. Pt currently max assist for all BUE ADL tasks, and one person hand held min assist for mobility. Pt fully educated in shoulder precautions, and exercises practiced. Pt will benefit from skilled OT in the acute care setting before d/c to SNF for further rehab to maximize independence in ADL, safety, and HEP.     Follow Up Recommendations  SNF    Equipment Recommendations  Other (comment) (To be determined by next venue of care)    Recommendations for Other Services       Precautions / Restrictions Precautions Precautions: Fall;Shoulder Type of Shoulder Precautions: Conservative Protocol Shoulder Interventions: Shoulder sling/immobilizer Precaution Booklet Issued: Yes (comment) Precaution Comments: Shoulder d/c handout personalized, reviewed thoroughly Required Braces or Orthoses: Sling Restrictions Weight Bearing Restrictions: Yes RUE Weight Bearing: Non weight bearing      Mobility Bed Mobility Overal bed mobility: Needs Assistance Bed Mobility: Supine to Sit     Supine to sit: HOB elevated;Mod assist     General bed mobility comments: Pt with Hand held assist to come to EOB  Transfers Overall transfer level: Needs assistance Equipment used: 1 person hand held assist Transfers: Sit to/from Stand Sit to Stand: Min assist         General transfer comment: HH assist min  to ambulate 3 ft from bed to [facility], then 8 feet to recliner    Balance Overall balance  assessment: Needs assistance Sitting-balance support: Feet supported Sitting balance-Leahy Scale: Fair   Postural control: Posterior lean Standing balance support: Single extremity supported;During functional activity Standing balance-Leahy Scale: Poor                              ADL Overall ADL's : Needs assistance/impaired Eating/Feeding: Minimal assistance;Sitting ([place] assist to cut food and open containers)   Grooming: Wash/dry face;Set up;Sitting Grooming Details (indicate cue type and reason): In recliner with washcloth, used LUE to complete Upper Body Bathing: Moderate assistance;Sitting   Lower Body Bathing: Moderate assistance;Sit to/from stand   Upper Body Dressing : Maximal assistance;Adhering to UE precautions;Sitting Upper Body Dressing Details (indicate cue type and reason): to don brace. Pt able to verbally instruct Lower Body Dressing: Moderate assistance;Sit to/from stand   Toilet Transfer: Minimal assistance Armed forces technical officer Details (indicate cue type and reason): to [facility] hand held assist Toileting- Clothing Manipulation and Hygiene: Moderate assistance;Cueing for safety (vc for safe hand placement with LUE)       Functional mobility during ADLs: Minimal assistance (hand held assist. RW at baseline for balance, shuffly gait) General ADL Comments: Pt max assist for bilateral hand tasks, Pt motivated to participate in therapy     Vision     Perception     Praxis      Pertinent Vitals/Pain Pain Assessment: 0-10 Pain Score: 5  Pain Location: Right shoulder at surgical site Pain Descriptors / Indicators: Aching;Sore Pain Intervention(s): Monitored during session;Repositioned;Patient requesting pain meds-RN notified;Ice applied  Hand Dominance Right   Extremity/Trunk Assessment Upper Extremity Assessment Upper Extremity Assessment: RUE deficits/detail RUE Deficits / Details: post op right reverse shoulder arthoplasty   Lower Extremity  Assessment Lower Extremity Assessment: Defer to PT evaluation   Cervical / Trunk Assessment Cervical / Trunk Assessment: Normal   Communication Communication Communication: No difficulties   Cognition Arousal/Alertness: Awake/alert Behavior During Therapy: WFL for tasks assessed/performed Overall Cognitive Status: Within Functional Limits for tasks assessed                     General Comments       Exercises Exercises: Shoulder (see Doc Flow if further questions - AROM elbow wrist, digits. No movement from shoulder)     Shoulder Instructions Shoulder Instructions Donning/doffing shirt without moving shoulder: Maximal assistance;Patient able to independently direct caregiver Method for sponge bathing under operated UE: Min-guard;Patient able to independently direct caregiver Donning/doffing sling/immobilizer: Maximal assistance;Patient able to independently direct caregiver Correct positioning of sling/immobilizer: Modified independent;Patient able to independently direct caregiver ROM for elbow, wrist and digits of operated UE: Supervision/safety Sling wearing schedule (on at all times/off for ADL's): Modified independent [place]per positioning of operated UE when showering: Supervision/safety Positioning of UE while sleeping: [place] expects to be discharged to:: Skilled nursing facility                                 Additional Comments: pt hoping to be DCed to [facility]      Prior Functioning/Environment Level of Independence: Independent with assistive device(s)        Comments: pt lives alone.  Takes [employer] or has friend to take her places.  pt uses RW to walk.  Pt denies history of falls        OT Problem List: Decreased range of motion;Decreased safety awareness;Impaired UE functional use;Pain;Decreased knowledge of precautions;Impaired balance (sitting and/or standing)   OT Treatment/Interventions:  Self-care/ADL training;DME and/or AE instruction;Therapeutic activities;Balance training    OT Goals(Current goals can be found in the care plan section) Acute Rehab OT Goals Patient Stated Goal: to go to rehab until independent enough to go home OT Goal Formulation: With patient Time For Goal Achievement: 05/19/16 Potential to Achieve Goals: Good ADL Goals Pt Will Perform Upper Body Dressing: with min assist;sitting Pt Will Perform Lower Body Dressing: with supervision;sit to/from stand Pt Will Transfer to Toilet: with supervision;ambulating Pt/caregiver will Perform Home Exercise Program: Right Upper extremity;With written HEP provided  OT Frequency: Min 2X/week   Barriers to D/C: Decreased caregiver support  Pt lives alone       Co-evaluation              End of Session Equipment Utilized During Treatment: Other (comment) (sling) Nurse Communication: Patient requests pain meds  Activity Tolerance: Patient tolerated treatment well Patient left: in chair;with call bell/phone within reach   Time: 1006-1058 OT Time Calculation (min): 52 min Charges:  OT General Charges $OT Visit: 1 Procedure OT Evaluation $OT Eval Low Complexity: 1 Procedure OT Treatments $Self Care/Home Management : 8-22 mins $Therapeutic Exercise: 8-22 mins G-Codes:    Merri Ray Wilver Tignor 05-13-2016, 1:37 PM  Hulda Humphrey OTR/L [phone]

## 2016-05-13 NOTE — Discharge Summary (Signed)
Physician Discharge Summary  Patient ID: Kathy [name] MRN: [id number] DOB/AGE: Feb 27, 1941 75 y.o.  Admit date: 05/11/2016 Discharge date: 05/13/2016  Admission Diagnoses:  Rotator cuff arthropathy, right  Discharge Diagnoses:  Principal Problem:   Rotator cuff arthropathy, right Active Problems:   S/P shoulder replacement   Past Medical History:  Diagnosis Date  . Anxiety    takes Alprazolam daily as needed  . Arthritis   . Diverticulosis   . Headache(784.0)    occasionally  . History of blood transfusion    no abnormal reaction noted  . History of blood transfusion    no abnormal reaction noted  . History of bronchitis    last time many yrs ago  . Hyperlipidemia   . Hypertension    takes Amlodipine daily  . Hypothyroid   . Hypothyroidism    takes Synthroid daily  . Joint pain   . Nocturia   . Numbness    occasionally left arm at night  . Osteoporosis    takes Fosamax weekly  . Peripheral edema    takes Lasix daily as needed  . PONV (postoperative nausea and vomiting)   . Rotator cuff arthropathy, 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    Surgeries: Procedure(s): REVERSE SHOULDER ARTHROPLASTY on 05/11/2016   Consultants (if any):   Discharged Condition: Improved  Hospital Course: Kathy [name] is an 75 y.o. female who was admitted 05/11/2016 with a diagnosis of Rotator cuff arthropathy, right and went to the operating room on 05/11/2016 and underwent the above named procedures.    She was given perioperative antibiotics:  Anti-infectives    Start     Dose/Rate Route Frequency Ordered Stop   05/11/16 1330  ceFAZolin (ANCEF) IVPB 1 g/50 mL premix     1 g 100 mL/hr over 30 Minutes Intravenous Every 6 hours 05/11/16 1222 05/12/16 0612   05/11/16 0544  ceFAZolin (ANCEF) IVPB 2g/100 mL premix     2 g 200 mL/hr over 30 Minutes Intravenous On call to O.R. 05/11/16 [id number] 05/11/16 0747    .  She was given sequential compression devices, early ambulation, and xarelto for DVT  prophylaxis given strong family history.  She benefited maximally from the hospital stay and there were no complications.    Recent vital signs:  Vitals:   05/12/16 1948 05/13/16 0500  BP: (!) 113/50 107/76  Pulse: 83 75  Resp: 18 18  Temp: 100 F (37.8 C) 98.7 F (37.1 C)    Recent laboratory studies:  Lab Results  Component Value Date   HGB 9.5 (L) 05/12/2016   HGB 11.9 (L) 05/04/2016   HGB 8.0 (L) 10/05/2013   Lab Results  Component Value Date   WBC 5.8 05/12/2016   PLT 202 05/12/2016   Lab Results  Component Value Date   INR 0.96 09/26/2013   Lab Results  Component Value Date   NA 139 05/12/2016   K 3.4 (L) 05/12/2016   CL 104 05/12/2016   CO2 27 05/12/2016   BUN 12 05/12/2016   CREATININE 0.66 05/12/2016   GLUCOSE 103 (H) 05/12/2016    Discharge Medications:     Medication List    STOP taking these medications   alendronate 70 MG tablet Commonly known as:  FOSAMAX   aspirin EC 81 MG tablet   meloxicam 15 MG tablet Commonly known as:  MOBIC   naproxen sodium 220 MG tablet Commonly known as:  ANAPROX     TAKE these medications   acetaminophen 325 MG tablet Commonly known  as:  TYLENOL Take 650 mg by mouth every 6 (six) hours as needed.   ALPRAZolam 0.25 MG tablet Commonly known as:  XANAX Take 0.25 mg by mouth at bedtime as needed for anxiety or sleep.   amLODipine 5 MG tablet Commonly known as:  NORVASC Take 5 mg by mouth daily as needed (when blood pressure gets high).   baclofen 10 MG tablet Commonly known as:  LIORESAL Take 1 tablet (10 mg total) by mouth 3 (three) times daily. As needed for muscle spasm   calcium carbonate 1250 MG capsule Take 300-600 mg by mouth See admin instructions. 600 mg in the morning, 300 mg in the afternoon, and 600 mg at bedtime   Cholecalciferol 1000 units Tbdp Take 1,000 Units by mouth 2 (two) times daily.   diclofenac sodium 1 % Gel Commonly known as:  VOLTAREN Apply 2 g topically 2 (two) times  daily as needed (for pain in knees and shoulder).   Fish Oil 1000 MG Caps Take 1,000 mg by mouth daily.   furosemide 40 MG tablet Commonly known as:  LASIX Take 40 mg by mouth daily as needed for fluid.   loratadine 10 MG tablet Commonly known as:  CLARITIN Take 10 mg by mouth daily as needed for allergies.   multivitamin with minerals Tabs tablet Take 1 tablet by mouth daily.   omeprazole 20 MG capsule Commonly known as:  PRILOSEC Take 20 mg by mouth daily as needed (for acid reflux and heartburn).   oxyCODONE 5 MG immediate release tablet Commonly known as:  ROXICODONE Take 1-2 tablets (5-10 mg total) by mouth every 4 (four) hours as needed for severe pain.   rivaroxaban 10 MG Tabs tablet Commonly known as:  XARELTO Take 1 tablet (10 mg total) by mouth daily.   sennosides-docusate sodium 8.6-50 MG tablet Commonly known as:  SENOKOT-S Take 2 tablets by mouth daily.   SYNTHROID 88 MCG tablet Generic drug:  levothyroxine Take 88 mcg by mouth daily before breakfast.   traMADol 50 MG tablet Commonly known as:  ULTRAM Take 50 mg by mouth every 6 (six) hours as needed for moderate pain.       Diagnostic Studies: Dg Shoulder Right Port  Result Date: 05/11/2016 CLINICAL DATA:  Status post reversed right shoulder arthroplasty. EXAM: PORTABLE RIGHT SHOULDER COMPARISON:  CT scan 03/23/2016 FINDINGS: Well seated components of the reverse total right shoulder arthroplasty. No complicating features are demonstrated. The visualized right lung is clear. IMPRESSION: Well seated components of a reverse total right shoulder arthroplasty. No complicating features. Electronically Signed   By: Marijo Sanes M.D.   On: 05/11/2016 11:26    Disposition: 03-Skilled [place], [place]. Schedule an appointment as soon as possible for a visit in 2 weeks.   Specialty:  Orthopedic Surgery Contact information: [address]  [place] [address] [phone]            Signed: [name] 05/13/2016, 9:13 AM

## 2016-05-13 NOTE — Progress Notes (Signed)
Physical Therapy Treatment Patient Details Name: Kathy [name] [place] MRN: [id number] DOB: 16-Nov-1940 Today's Date: 05/13/2016    History of Present Illness "[name]" is a 75 y.o. female who presents for preoperative history and physical with a diagnosis of RC arthropathy RIGHT SHOULDER. Symptoms are rated as moderate to severe, and have been worsening.  pt for right reverse s houlder arthoplasty.  pt has had both hips replaced.    PT Comments    Pt performed increased activity and progressed gait distance this treatment.  Plan for d/c to [facility] this afternoon.  Follow Up Recommendations  SNF;Supervision/Assistance - 24 hour     Equipment Recommendations       Recommendations for Other Services       Precautions / Restrictions Precautions Precautions: Fall;Shoulder Shoulder Interventions: Shoulder sling/immobilizer Precaution Booklet Issued: Yes (comment) Precaution Comments: Shoulder d/c handout personalized, reviewed thoroughly Required Braces or Orthoses: Sling Restrictions Weight Bearing Restrictions: Yes RUE Weight Bearing: Non weight bearing    Mobility  Bed Mobility Overal bed mobility: Needs Assistance Bed Mobility: Sit to Supine       Sit to supine: Min assist   General bed mobility comments: Pt able to lift B LEs against gravity while PTA assisted patient's trunk into supine.    Transfers Overall transfer level: Needs assistance Equipment used: 1 person hand held assist Transfers: Sit to/from Stand Sit to Stand: Min assist Stand pivot transfers: Min assist       General transfer comment: Pt required increased time to stand with wide BOS and valgus strain on L knee.    Ambulation/Gait Ambulation/Gait assistance: Min assist Ambulation Distance (Feet): 58 Feet Assistive device: 1 person hand held assist Gait Pattern/deviations: Step-through pattern;Trunk flexed;Wide base of support;Antalgic   Gait velocity interpretation: Below normal speed for  age/gender General Gait Details: Cues for sequencing and weight shifting.  Pt able to progress gait distance.     Stairs            Wheelchair Mobility    Modified Rankin (Stroke Patients Only)       Balance Overall balance assessment: Needs assistance   Sitting balance-Leahy Scale: Fair       Standing balance-Leahy Scale: Poor                      Cognition Arousal/Alertness: Awake/alert Behavior During Therapy: WFL for tasks assessed/performed Overall Cognitive Status: Within Functional Limits for tasks assessed                      Exercises Total Joint Exercises Ankle Circles/Pumps: AROM;Both;10 reps;Supine Quad Sets: AROM;Both;10 reps;Supine Heel Slides: AROM;Both;10 reps;Supine Hip ABduction/ADduction: AROM;Both;10 reps;Supine    General Comments        Pertinent Vitals/Pain Pain Assessment: 0-10 Pain Score: 5  Pain Location: R shoulder  Pain Descriptors / Indicators: Sore Pain Intervention(s): Monitored during session;Repositioned;Ice applied    Home Living                      Prior Function            PT Goals (current goals can now be found in the care plan section) Acute Rehab PT Goals Patient Stated Goal: to go to rehab until independent enough to go home Potential to Achieve Goals: Good Progress towards PT goals: Progressing toward goals    Frequency    Min 4X/week      PT Plan Current plan remains appropriate  Co-evaluation             End of Session Equipment Utilized During Treatment: Gait belt Activity Tolerance: Patient tolerated treatment well Patient left: in chair;with call bell/phone within reach     Time: 1149-1215 PT Time Calculation (min) (ACUTE ONLY): 26 min  Charges:  $Gait Training: 8-22 mins $Therapeutic Exercise: 8-22 mins                    G Codes:      Cristela Blue Jun 06, 2016, 12:24 PM  Governor R[place], PTA pager [phone]

## 2016-05-13 NOTE — Progress Notes (Signed)
Patient ID: Kathy [name], female   DOB: 1940/09/09, 75 y.o.   MRN: [id number]     Subjective:  Patient reports pain as mild.  Patient denies any CP or SOB.  States that she is scheduled to go to [facility] today  Objective:   VITALS:   Vitals:   05/12/16 0540 05/12/16 1417 05/12/16 1948 05/13/16 0500  BP: 115/72 92/77 (!) 113/50 107/76  Pulse: 68 70 83 75  Resp: 18 18 18 18   Temp: 98.5 F (36.9 C) 99.5 F (37.5 C) 100 F (37.8 C) 98.7 F (37.1 C)  TempSrc: Oral Oral Oral Oral  SpO2: 100% 100% 98% 98%  Weight:        ABD soft Sensation intact distally Dorsiflexion/Plantar flexion intact Incision: dressing C/D/I and no drainage Dry dressing changed wound good  Lab Results  Component Value Date   WBC 5.8 05/12/2016   HGB 9.5 (L) 05/12/2016   HCT 28.2 (L) 05/12/2016   MCV 93.1 05/12/2016   PLT 202 05/12/2016   BMET    Component Value Date/Time   NA 139 05/12/2016 0303   K 3.4 (L) 05/12/2016 0303   CL 104 05/12/2016 0303   CO2 27 05/12/2016 0303   GLUCOSE 103 (H) 05/12/2016 0303   BUN 12 05/12/2016 0303   CREATININE 0.66 05/12/2016 0303   CALCIUM 8.0 (L) 05/12/2016 0303   GFRNONAA >60 05/12/2016 0303   GFRAA >60 05/12/2016 0303     Assessment/Plan: 2 Days Post-Op   Principal Problem:   Rotator cuff arthropathy, right Active Problems:   S/P shoulder replacement   Advance diet Up with therapy Discharge to SNF NWB right upper ext Sling at all times Follow up with Dr Mardelle Matte in 2 weeks   Remonia Richter 05/13/2016, 10:26 AM   Marchia Bond, MD Cell [phone]

## 2016-05-13 NOTE — Progress Notes (Addendum)
Patient to be discharged. IV removed, transportation notified. Report called to [facility] place and report given to Izell Orland, LPN.  Billey Wojciak A Nikodem Leadbetter, RN

## 2016-05-13 NOTE — Care Management Note (Signed)
Case Management Note  Patient Details  Name: Kathy [name] MRN: [id number] Date of Birth: [birth year]  Subjective/Objective:   S/p shoulder replacement               Action/Plan: Discharge Planning: AVS reviewed: Chart reviewed. CSW following for SNF placement. Scheduled dc today to SNF.  Expected Discharge Date:  05/13/2016              Expected Discharge Plan:  [place]  In-House Referral:  Clinical Social Work  Discharge planning Services  CM Consult  Post Acute Care Choice:  NA Choice offered to:  NA  DME Arranged:  N/A DME Agency:  NA  HH Arranged:  NA HH Agency:  NA  Status of Service:  Completed, signed off  If discussed at [employer] of Stay Meetings, dates discussed:    Additional Comments:  Erenest Rasher, RN 05/13/2016, 2:11 PM

## 2016-05-13 NOTE — Consult Note (Signed)
            [facility] CM Primary Care Navigator  05/13/2016  Kathy [name] Apr 01, 1941 [id number]  Patient seen at the bedside to identify discharge needs. Patient shared that worsening pain to right shoulder and limitation of movements to right arm led to this admission/ surgery. Patient states that discharge plan is for short- term skilled nursing facility (prefers [facility]) since she lives alone and does not have help at home.  Patient confirms that primary care provider is  Dr. Sinda Du from Danley Danker MD at [facility]. Transportation to doctors' appointments is provided by her friends or rides on [employer] bus as stated. Transportation benefit from [place] discussed with patient to use when needed. Patient was thankful for the information.  Patient states using [employer] in [place] with home delivery service to obtain medications without difficulty. Patient does her medication management at home using "pill box" system. Patient will be primary caregiver for herself when she goes home from SNF as stated.  She had expressed understanding to call primary care provider's office for a post discharge follow-up appointment within a week or sooner if needed when she gets home. Patient letter provided as a reminder.  For questions, please contact:  Edwena Felty A. Solita Macadam, BSN, RN-BC [facility] PRIMARY CARE Navigator Cell: [phone]

## 2016-05-13 NOTE — Clinical Social Work Note (Addendum)
Patient will discharge today per MD order. Patient will discharge to: [facility] SNF RN to call report prior to transportation to: [phone] Transportation: [facility]  CSW sent discharge summary to SNF for review.   11:44am- insurance authorization received [id number]   Nonnie Done, MSW, LCSW  ([id number]) [id number]  Licensed Clinical Social Worker

## 2016-05-13 NOTE — Progress Notes (Signed)
Occupational Therapy Treatment Patient Details Name: Kathy [name] MRN: [id number] DOB: 1940-09-08 Today's Date: 05/13/2016    History of present illness "Kathy [name]" is a 75 y.o. female who presents for preoperative history and physical with a diagnosis of RC arthropathy RIGHT SHOULDER. Symptoms are rated as moderate to severe, and have been worsening.  pt for right reverse s houlder arthoplasty.  pt has had both hips replaced.   OT comments  Patient tolerated performance of AROM elbow, wrist, hand exercises on RUE. Up to recliner. Progressing slowly towards OT goals.   Follow Up Recommendations  SNF    Equipment Recommendations   (tbd next venue)    Recommendations for Other Services      Precautions / Restrictions Precautions Precautions: Fall;Shoulder Type of Shoulder Precautions: Conservative Protocol Shoulder Interventions: Shoulder sling/immobilizer Precaution Booklet Issued: Yes (comment) Precaution Comments: reviewed handout with patient Required Braces or Orthoses: Sling Restrictions Weight Bearing Restrictions: Yes RUE Weight Bearing: Non weight bearing       Mobility Bed Mobility Overal bed mobility: Needs Assistance Bed Mobility: Supine to Sit     Supine to sit: HOB elevated;Mod assist     General bed mobility comments: Pt with Hand held assist to come to EOB  Transfers Overall transfer level: Needs assistance Equipment used: 1 person hand held assist Transfers: Sit to/from Stand Sit to Stand: Min assist         General transfer comment: Handheld assist to ambulate around bed to recliner.    Balance                                   ADL Overall ADL's : Needs assistance/impaired Eating/Feeding: Minimal assistance;Bed level               Upper Body Dressing : Maximal assistance;Adhering to UE precautions;Sitting Upper Body Dressing Details (indicate cue type and reason): to don brace. Pt able to verbally instruct     Toilet  Transfer: Minimal assistance Toilet Transfer Details (indicate cue type and reason): simulated bed to recliner         Functional mobility during ADLs: Minimal assistance General ADL Comments: Patient performed AROM exercises R elbow/wrist/hand. Sling removed for exercises and reapplied after exercises. Referred pt to handout given yesterday regarding exercises. Patient wanted to sit up in recliner. Amb around the bed to recliner with min A. Positioned comfortably with ice pack applied to shoulder.       Vision                     Perception     Praxis      Cognition   Behavior During Therapy: [facility] for tasks assessed/performed Overall Cognitive Status: Within Functional Limits for tasks assessed                       Extremity/Trunk Assessment               Exercises Shoulder Exercises Elbow Flexion: AROM;Right;10 reps;Standing Elbow Extension: AROM;Right;10 reps;Standing Wrist Flexion: AROM;Right Wrist Extension: AROM;Right Digit Composite Flexion: AROM;Right Composite Extension: AROM;Right Neck Flexion: AROM Neck Extension: AROM Neck Lateral Flexion - Right: AROM Neck Lateral Flexion - Left: AROM Donning/doffing sling/immobilizer: Maximal assistance;Patient able to independently direct caregiver Correct positioning of sling/immobilizer: Modified independent;Patient able to independently direct caregiver ROM for elbow, wrist and digits of operated UE: Supervision/safety Sling wearing schedule (on at all times/off  for ADL's): Modified independent Proper positioning of operated UE when showering: Supervision/safety   Shoulder Instructions Shoulder Instructions Donning/doffing sling/immobilizer: Maximal assistance;Patient able to independently direct caregiver Correct positioning of sling/immobilizer: Modified independent;Patient able to independently direct caregiver ROM for elbow, wrist and digits of operated UE: Supervision/safety Sling wearing  schedule (on at all times/off for ADL's): Modified independent Proper positioning of operated UE when showering: Supervision/safety     General Comments      Pertinent Vitals/ Pain       Pain Assessment: 0-10 Pain Score: 5  Pain Location: R shoulder Pain Descriptors / Indicators: Sore Pain Intervention(s): Monitored during session;Ice applied  Home Living                                          Prior Functioning/Environment              Frequency  Min 2X/week        Progress Toward Goals  OT Goals(current goals can now be found in the care plan section)  Progress towards OT goals: Progressing toward goals  Acute Rehab OT Goals Patient Stated Goal: to go to rehab until independent enough to go home  Plan Discharge plan remains appropriate    Co-evaluation                 End of Session Equipment Utilized During Treatment: Other (comment) (sling)   Activity Tolerance Patient tolerated treatment well   Patient Left in chair;with call bell/phone within reach   Nurse Communication          Time: 0752-0807 OT Time Calculation (min): 15 min  Charges: OT General Charges $OT Visit: 1 Procedure OT Treatments $Therapeutic Exercise: 8-22 mins  Maedell Hedger A 05/13/2016, 8:16 AM

## 2016-05-13 NOTE — Clinical Social Work Placement (Signed)
   CLINICAL SOCIAL WORK PLACEMENT  NOTE  Date:  05/13/2016  Patient Details  Name: Kathy [name] MRN: [id number] Date of Birth: [birth year]  Clinical Social Work is seeking post-discharge placement for this patient at the [place] level of care (*CSW will initial, date and re-position this form in  chart as items are completed):  Yes   Patient/family provided with [facility] [facility] Work Department's list of facilities offering this level of care within the geographic area requested by the patient (or if unable, by the patient's family).  Yes   Patient/family informed of their freedom to choose among providers that offer the needed level of care, that participate in Medicare, Medicaid or managed care program needed by the patient, have an available bed and are willing to accept the patient.  Yes   Patient/family informed of [facility]'s ownership interest in [facility] and [facility], as well as of the fact that they are under no obligation to receive care at these facilities.  PASRR submitted to EDS on       PASRR number received on       Existing PASRR number confirmed on 05/12/16     FL2 transmitted to all facilities in geographic area requested by pt/family on 05/12/16     FL2 transmitted to all facilities within larger geographic area on       Patient informed that his/her managed care company has contracts with or will negotiate with certain facilities, including the following:        Yes   Patient/family informed of bed offers received.  Patient chooses bed at [facility]     Physician recommends and patient chooses bed at      Patient to be transferred to [facility] on 05/13/16.  Patient to be transferred to facility by [facility]     Patient family notified on 05/13/16 of transfer.  Name of family member notified:  patient is alert and oriented and states she will update friends/family as necessary.     PHYSICIAN Please prepare  priority discharge summary, including medications     Additional Comment:    _______________________________________________ Dulcy Fanny, LCSW 05/13/2016, 10:25 AM

## 2016-05-14 ENCOUNTER — Non-Acute Institutional Stay (SKILLED_NURSING_FACILITY): Payer: Commercial Managed Care - HMO | Admitting: Internal Medicine

## 2016-05-14 ENCOUNTER — Encounter: Payer: Self-pay | Admitting: Internal Medicine

## 2016-05-14 DIAGNOSIS — E038 Other specified hypothyroidism: Secondary | ICD-10-CM

## 2016-05-14 DIAGNOSIS — R600 Localized edema: Secondary | ICD-10-CM

## 2016-05-14 DIAGNOSIS — M12811 Other specific arthropathies, not elsewhere classified, right shoulder: Secondary | ICD-10-CM

## 2016-05-14 DIAGNOSIS — E876 Hypokalemia: Secondary | ICD-10-CM

## 2016-05-14 DIAGNOSIS — K219 Gastro-esophageal reflux disease without esophagitis: Secondary | ICD-10-CM | POA: Diagnosis not present

## 2016-05-14 DIAGNOSIS — I10 Essential (primary) hypertension: Secondary | ICD-10-CM | POA: Diagnosis not present

## 2016-05-14 DIAGNOSIS — K5901 Slow transit constipation: Secondary | ICD-10-CM | POA: Diagnosis not present

## 2016-05-14 DIAGNOSIS — D62 Acute posthemorrhagic anemia: Secondary | ICD-10-CM | POA: Diagnosis not present

## 2016-05-14 NOTE — Progress Notes (Signed)
LOCATION: [place]  PCP: Alonza Bogus, MD   Code Status: Full Code  Goals of care: Advanced Directive information Advanced Directives 05/04/2016  Does patient have an advance directive? No  Would patient like information on creating an advanced directive? Yes - Educational materials given  Pre-existing out of facility DNR order (yellow form or pink MOST form) -       Extended Emergency Contact Information Primary Emergency Contact: Northington,[name] Address: [place] [address] [name] of [place] Phone: [phone] Mobile Phone: [phone] Relation: Brother Secondary Emergency Contact: Zambito,Donnie Address: [place] [address] [name] of [place] Phone: [phone] Mobile Phone: [phone] Relation: Brother   Allergies  Allergen Reactions  . Aspirin Other (See Comments)    Adult Aspirin: upset stomach / irritates ulcer     Chief Complaint  Patient presents with  . New Admit To SNF    New Admission Visit     HPI:  Patient is a 75 y.o. female seen today for short term rehabilitation post hospital admission from 05/11/16-05/13/16 with right rotator cuff arthropathy. She underwent right shoulder replacement surgery. Her pain is under control with current pain medication. She is seen in her room today.   Review of Systems:  Constitutional: Negative for fever, chills, diaphoresis.  HENT: Negative for headache, congestion, nasal discharge Eyes: Negative for blurred vision, double vision and discharge. Wears glasses. Respiratory: Negative for cough, shortness of breath and wheezing.   Cardiovascular: Negative for chest pain, palpitations, leg swelling.  Gastrointestinal: Negative for heartburn, nausea, vomiting, abdominal pain. Last bowel movement was Tuesday morning. Passing gas. Had regular bowel movement at home Genitourinary: Negative for dysuria and flank pain.    Musculoskeletal: Negative for back pain, fall in the facility.  Skin: Negative for itching, rash.  Neurological: Negative for dizziness. Psychiatric/Behavioral: Negative for depression   Past Medical History:  Diagnosis Date  . Anxiety    takes Alprazolam daily as needed  . Arthritis   . Diverticulosis   . Headache(784.0)    occasionally  . History of blood transfusion    no abnormal reaction noted  . History of blood transfusion    no abnormal reaction noted  . History of bronchitis    last time many yrs ago  . Hyperlipidemia   . Hypertension    takes Amlodipine daily  . Hypothyroid   . Hypothyroidism    takes Synthroid daily  . Joint pain   . Nocturia   . Numbness    occasionally left arm at night  . Osteoporosis    takes Fosamax weekly  . Peripheral edema    takes Lasix daily as needed  . PONV (postoperative nausea and vomiting)   . Rotator cuff arthropathy, 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   Past Surgical History:  Procedure Laterality Date  . ABDOMINAL HYSTERECTOMY    . cataract surgery Bilateral   . CHOLECYSTECTOMY    . COLONOSCOPY    . gastric bypass surgery    . JOINT REPLACEMENT Bilateral    hip  . REVERSE SHOULDER ARTHROPLASTY Right 05/11/2016   Procedure: REVERSE SHOULDER ARTHROPLASTY;  Surgeon: Marchia Bond, MD;  Location: [place];  Service: Orthopedics;  Laterality: Right;  . REVISION TOTAL HIP ARTHROPLASTY Left 10/03/2013   DR Mayer Camel  . TOTAL HIP REVISION Left 10/03/2013   Procedure: TOTAL HIP REVISION- left;  Surgeon: Kathalene Frames  Mayer Camel, MD;  Location: [place];  Service: Orthopedics;  Laterality: Left;  . TOTAL THYROIDECTOMY     Social History:   reports that she has never smoked. She has never used smokeless tobacco. She reports that she does not drink alcohol or use drugs.  No family history on file.  Medications:   Medication List       Accurate as of 05/14/16  1:42 PM. Always use your most recent med list.          acetaminophen 325 MG tablet Commonly  known as:  TYLENOL Take 650 mg by mouth every 6 (six) hours as needed.   ALPRAZolam 0.25 MG tablet Commonly known as:  XANAX Take 0.25 mg by mouth at bedtime as needed for anxiety or sleep.   amLODipine 5 MG tablet Commonly known as:  NORVASC Take 5 mg by mouth daily as needed (when blood pressure gets high).   baclofen 10 MG tablet Commonly known as:  LIORESAL Take 1 tablet (10 mg total) by mouth 3 (three) times daily. As needed for muscle spasm   calcium carbonate 1250 MG capsule Take 300-600 mg by mouth See admin instructions. 600 mg in the morning, 300 mg in the afternoon, and 600 mg at bedtime   Cholecalciferol 1000 units Tbdp Take 1,000 Units by mouth 2 (two) times daily.   diclofenac sodium 1 % Gel Commonly known as:  VOLTAREN Apply 2 g topically 2 (two) times daily as needed (for pain in knees and shoulder).   Fish Oil 1000 MG Caps Take 1,000 mg by mouth daily.   furosemide 40 MG tablet Commonly known as:  LASIX Take 40 mg by mouth daily.   loratadine 10 MG tablet Commonly known as:  CLARITIN Take 10 mg by mouth daily as needed for allergies.   multivitamin with minerals Tabs tablet Take 1 tablet by mouth daily.   omeprazole 20 MG capsule Commonly known as:  PRILOSEC Take 20 mg by mouth daily.   oxyCODONE 5 MG immediate release tablet Commonly known as:  ROXICODONE Take 1-2 tablets (5-10 mg total) by mouth every 4 (four) hours as needed for severe pain.   rivaroxaban 10 MG Tabs tablet Commonly known as:  XARELTO Take 1 tablet (10 mg total) by mouth daily.   sennosides-docusate sodium 8.6-50 MG tablet Commonly known as:  SENOKOT-S Take 2 tablets by mouth daily.   SYNTHROID 88 MCG tablet Generic drug:  levothyroxine Take 88 mcg by mouth daily before breakfast.   traMADol 50 MG tablet Commonly known as:  ULTRAM Take 50 mg by mouth every 6 (six) hours as needed for moderate pain.       Immunizations: Immunization History  Administered Date(s)  Administered  . PPD Test 05/13/2016     Physical Exam:  Vitals:   05/14/16 1336  BP: 117/66  Pulse: 76  Resp: 16  Temp: 98.4 F (36.9 C)  TempSrc: Oral  SpO2: 98%  Weight: 156 lb (70.8 kg)  Height: 5\' 1"  (1.549 m)   Body mass index is 29.48 kg/m.  General- elderly female, well built, in no acute distress Head- normocephalic, atraumatic Nose- no nasal discharge Throat- moist mucus membrane Eyes- PERRLA, EOMI, no pallor, no icterus, no discharge, normal conjunctiva, normal sclera Neck- no cervical lymphadenopathy Cardiovascular- normal s1,s2, no murmur, trace leg edema Respiratory- bilateral clear to auscultation, no wheeze, no rhonchi, no crackles, no use of accessory muscles Abdomen- bowel sounds present, soft, non tender Musculoskeletal-  Right UE in sling, able to  move her fingers and has good radial pulse and capillary refill to her fingers, able to move all other 3 extremities Neurological- alert and oriened to person, place and time Skin- warm and dry, right shoulder surgical incision with steri strip in place and dressing over it Psychiatry- normal mood and affect    Labs reviewed: Basic Metabolic Panel:  Recent Labs  05/04/16 1332 05/12/16 0303  NA 140 139  K 4.3 3.4*  CL 107 104  CO2 27 27  GLUCOSE 85 103*  BUN 9 12  CREATININE 0.62 0.66  CALCIUM 9.5 8.0*   Liver Function Tests: No results for input(s): AST, ALT, ALKPHOS, BILITOT, PROT, ALBUMIN in the last 8760 hours. No results for input(s): LIPASE, AMYLASE in the last 8760 hours. No results for input(s): AMMONIA in the last 8760 hours. CBC:  Recent Labs  05/04/16 1332 05/12/16 0303  WBC 4.7 5.8  HGB 11.9* 9.5*  HCT 37.1 28.2*  MCV 96.6 93.1  PLT 251 202    Radiological Exams: Dg Shoulder Right Port  Result Date: 05/11/2016 CLINICAL DATA:  Status post reversed right shoulder arthroplasty. EXAM: PORTABLE RIGHT SHOULDER COMPARISON:  CT scan 03/23/2016 FINDINGS: Well seated components  of the reverse total right shoulder arthroplasty. No complicating features are demonstrated. The visualized right lung is clear. IMPRESSION: Well seated components of a reverse total right shoulder arthroplasty. No complicating features. Electronically Signed   By: Marijo Sanes M.D.   On: 05/11/2016 11:26    Assessment/Plan  Right rotator cuff arthropathy S/p right shoulder surgical repair. Has orthopedic follow up. Will have her work with physical therapy and occupational therapy team to help with gait training and muscle strengthening exercises.fall precautions. Skin care. Encourage to be out of bed. Continue oxyIR 5 mg 1-2 tab q4h prn pain and tramadol 50 mg q6h prn pain. Continue xarelto for dvt prophylaxis. Continue baclofen 10 mg tid for muscle spasm.   Blood loss anemia Post op, monitor cbc  Hypokalemia Monitor bmp with her on lasix. Start kcl 10 meq po daily  Constipation Currently on senokot s 2 tab daily. Add prune juice and miralax daily and monitor. Hydration encouraged  Hypothyroidism Continue levothyroxine 88 mcg daily  HTN Monitor bp, continue norvasc on need basis  Leg edema To wear her ted hose and continue lasix 40 m daily, check bmp  gerd Continue omeprazole     Goals of care: short term rehabilitation   Labs/tests ordered: cbc, cmp 05/17/16  Family/ staff Communication: reviewed care plan with patient and nursing supervisor    Blanchie Serve, MD Internal Medicine [place], [place] [address] Cell Phone (Monday-Friday 8 am - 5 pm): [phone] On Call: [phone] and follow prompts after 5 pm and on weekends Office Phone: [phone] Office Fax: [phone]

## 2016-05-17 LAB — HEPATIC FUNCTION PANEL
ALT: 12 U/L (ref 7–35)
AST: 20 U/L (ref 13–35)
Alkaline Phosphatase: 88 U/L (ref 25–125)
Bilirubin, Total: 0.6 mg/dL

## 2016-05-17 LAB — BASIC METABOLIC PANEL
BUN: 24 mg/dL — AB (ref 4–21)
Creatinine: 0.8 mg/dL (ref 0.5–1.1)
Glucose: 111 mg/dL
Potassium: 4.1 mmol/L (ref 3.4–5.3)
Sodium: 143 mmol/L (ref 137–147)

## 2016-05-17 LAB — CBC AND DIFFERENTIAL
HCT: 32 % — AB (ref 36–46)
Hemoglobin: 10.3 g/dL — AB (ref 12.0–16.0)
Neutrophils Absolute: 3 /uL
Platelets: 342 10*3/uL (ref 150–399)
WBC: 5 10^3/mL

## 2016-05-31 ENCOUNTER — Encounter: Payer: Self-pay | Admitting: Adult Health

## 2016-05-31 ENCOUNTER — Non-Acute Institutional Stay (SKILLED_NURSING_FACILITY): Payer: Commercial Managed Care - HMO | Admitting: Adult Health

## 2016-05-31 DIAGNOSIS — R600 Localized edema: Secondary | ICD-10-CM | POA: Diagnosis not present

## 2016-05-31 DIAGNOSIS — F419 Anxiety disorder, unspecified: Secondary | ICD-10-CM | POA: Diagnosis not present

## 2016-05-31 DIAGNOSIS — D62 Acute posthemorrhagic anemia: Secondary | ICD-10-CM | POA: Diagnosis not present

## 2016-05-31 DIAGNOSIS — K5901 Slow transit constipation: Secondary | ICD-10-CM | POA: Diagnosis not present

## 2016-05-31 DIAGNOSIS — E038 Other specified hypothyroidism: Secondary | ICD-10-CM | POA: Diagnosis not present

## 2016-05-31 DIAGNOSIS — I10 Essential (primary) hypertension: Secondary | ICD-10-CM | POA: Diagnosis not present

## 2016-05-31 DIAGNOSIS — E876 Hypokalemia: Secondary | ICD-10-CM | POA: Diagnosis not present

## 2016-05-31 DIAGNOSIS — K219 Gastro-esophageal reflux disease without esophagitis: Secondary | ICD-10-CM

## 2016-05-31 DIAGNOSIS — J309 Allergic rhinitis, unspecified: Secondary | ICD-10-CM

## 2016-05-31 DIAGNOSIS — M170 Bilateral primary osteoarthritis of knee: Secondary | ICD-10-CM

## 2016-05-31 DIAGNOSIS — M12811 Other specific arthropathies, not elsewhere classified, right shoulder: Secondary | ICD-10-CM | POA: Diagnosis not present

## 2016-05-31 NOTE — Progress Notes (Signed)
Patient ID: Kathy [name], female   DOB: 1941/05/19, 75 y.o.   MRN: [id number]    DATE:  05/31/2016   MRN:  [id number]  BIRTHDAY: Aug 29, 1940  Facility:  [facility]ursing Home Location:  [place] Room Number: [id number]  LEVEL OF CARE:  SNF [phone])  Contact Information    Name Relation Home Work Mobile   [place] Brother [phone]  [place] [phone]  [phone]   [place]   [phone]       Code Status History    Date Active Date Inactive Code Status Order ID Comments User Context   05/11/2016 12:22 PM 05/13/2016  6:12 PM Full Code [id number]  Marchia Bond, MD Inpatient   10/03/2013  4:14 PM 10/05/2013  7:33 PM Full Code [id number]  Leighton Parody, PA-C Inpatient       Chief Complaint  Patient presents with  . Discharge Note    HISTORY OF PRESENT ILLNESS:  This is a 75 year old female who is for discharge home with Home health PT and OT.  She has been admitted to [facility] on 05/13/16 from [facility] hospitalization 05/11/16 - 05/13/16. She had right rotator cuff arthropathy for which she had right reverse shoulder arthroplasty on 05/11/16.  Patient was admitted to this facility for short-term rehabilitation after the patient's recent hospitalization.  Patient has completed SNF rehabilitation and therapy has cleared the patient for discharge.   PAST MEDICAL HISTORY:  Past Medical History:  Diagnosis Date  . Anxiety    takes Alprazolam daily as needed  . Arthritis   . Diverticulosis   . Headache(784.0)    occasionally  . History of blood transfusion    no abnormal reaction noted  . History of bronchitis    last time many yrs ago  . Hyperlipidemia   . Hypertension    takes Amlodipine daily  . Hypothyroidism    takes Synthroid daily  . Joint pain   . Nocturia   . Numbness    occasionally left arm at night  . Osteoporosis    takes Fosamax weekly  . Peripheral edema    takes  Lasix daily as needed  . PONV (postoperative nausea and vomiting)   . Rotator cuff arthropathy, 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     CURRENT MEDICATIONS: Reviewed  Patient's Medications  New Prescriptions   No medications on file  Previous Medications   ACETAMINOPHEN (TYLENOL) 325 MG TABLET    Take 650 mg by mouth every 6 (six) hours as needed.   ALPRAZOLAM (XANAX) 0.25 MG TABLET    Take 0.25 mg by mouth at bedtime as needed for anxiety or sleep.   AMLODIPINE (NORVASC) 5 MG TABLET    Take 5 mg by mouth daily.    BACLOFEN (LIORESAL) 10 MG TABLET    Take 1 tablet (10 mg total) by mouth 3 (three) times daily. As needed for muscle spasm   CALCIUM CARBONATE 1250 MG CAPSULE    Take 300-600 mg by mouth See admin instructions. 600 mg in the morning, 300 mg in the afternoon, and 600 mg at bedtime   CHOLECALCIFEROL 1000 UNITS TBDP    Take 1,000 Units by mouth 2 (two) times daily.   DICLOFENAC SODIUM (VOLTAREN) 1 % GEL    Apply 2 g topically 2 (two) times daily as needed (for pain in knees and shoulder).   FUROSEMIDE (LASIX) 40 MG TABLET    Take 40 mg by mouth daily.    LORATADINE (  CLARITIN) 10 MG TABLET    Take 10 mg by mouth daily as needed for allergies.    MULTIPLE VITAMIN (MULTIVITAMIN WITH MINERALS) TABS TABLET    Take 1 tablet by mouth daily.    OMEGA-3 FATTY ACIDS (FISH OIL) 1000 MG CAPS    Take 1,000 mg by mouth daily.   OMEPRAZOLE (PRILOSEC) 20 MG CAPSULE    Take 20 mg by mouth daily.    OXYCODONE (ROXICODONE) 5 MG IMMEDIATE RELEASE TABLET    Take 1-2 tablets (5-10 mg total) by mouth every 4 (four) hours as needed for severe pain.   POLYETHYLENE GLYCOL (MIRALAX / GLYCOLAX) PACKET    Take 17 g by mouth daily.   POTASSIUM CHLORIDE (K-DUR,KLOR-CON) 10 MEQ TABLET    Take 10 mEq by mouth daily.   RIVAROXABAN (XARELTO) 10 MG TABS TABLET    Take 1 tablet (10 mg total) by mouth daily.   SENNOSIDES-DOCUSATE SODIUM (SENOKOT-S) 8.6-50 MG TABLET    Take 2 tablets by mouth daily.   SYNTHROID 88 MCG TABLET    Take 88  mcg by mouth daily before breakfast.    TRAMADOL (ULTRAM) 50 MG TABLET    Take 50 mg by mouth every 6 (six) hours as needed for moderate pain.   Modified Medications   No medications on file  Discontinued Medications   No medications on file     Allergies  Allergen Reactions  . Aspirin Other (See Comments)    Adult Aspirin: upset stomach / irritates ulcer      REVIEW OF SYSTEMS:  GENERAL: no change in appetite, no fatigue, no weight changes, no fever, chills or weakness EYES: Denies change in vision, dry eyes, eye pain, itching or discharge EARS: Denies change in hearing, ringing in ears, or earache NOSE: Denies nasal congestion or epistaxis MOUTH and THROAT: Denies oral discomfort, gingival pain or bleeding, pain from teeth or hoarseness   RESPIRATORY: no cough, SOB, DOE, wheezing, hemoptysis CARDIAC: no chest pain, edema or palpitations GI: no abdominal pain, diarrhea, constipation, heart burn, nausea or vomiting GU: Denies dysuria, frequency, hematuria, incontinence, or discharge PSYCHIATRIC: Denies feeling of depression or anxiety. No report of hallucinations, insomnia, paranoia, or agitation   PHYSICAL EXAMINATION  GENERAL APPEARANCE: Well nourished. In no acute distress. Normal body habitus SKIN:  Right shoulder surgical incision has steri-strip, dry and no redness HEAD: Normal in size and contour. No evidence of trauma EYES: Lids open and close normally. No blepharitis, entropion or ectropion. PERRL. Conjunctivae are clear and sclerae are white. Lenses are without opacity EARS: Pinnae are normal. Patient hears normal voice tunes of the examiner MOUTH and THROAT: Lips are without lesions. Oral mucosa is moist and without lesions. Tongue is normal in shape, size, and color and without lesions NECK: supple, trachea midline, no neck masses, no thyroid tenderness, no thyromegaly LYMPHATICS: no LAN in the neck, no supraclavicular LAN RESPIRATORY: breathing is even &  unlabored, BS CTAB CARDIAC: RRR, no murmur,no extra heart sounds, BLE 1+ edema GI: abdomen soft, normal BS, no masses, no tenderness, no hepatomegaly, no splenomegaly EXTREMITIES:  Able to move X 4 extremities; right arm on sling PSYCHIATRIC: Alert and oriented X 3. Affect and behavior are appropriate  LABS/RADIOLOGY: Labs reviewed: Basic Metabolic Panel:  Recent Labs  05/04/16 1332 05/12/16 0303 05/17/16  NA 140 139 143  K 4.3 3.4* 4.1  CL 107 104  --   CO2 27 27  --   GLUCOSE 85 103*  --   BUN 9  12 24*  CREATININE 0.62 0.66 0.8  CALCIUM 9.5 8.0*  --    Liver Function Tests:  Recent Labs  05/17/16  AST 20  ALT 12  ALKPHOS 88   CBC:  Recent Labs  05/04/16 1332 05/12/16 0303 05/17/16  WBC 4.7 5.8 5.0  NEUTROABS  --   --  3  HGB 11.9* 9.5* 10.3*  HCT 37.1 28.2* 32*  MCV 96.6 93.1  --   PLT 251 202 342     Dg Shoulder Right Port  Result Date: 05/11/2016 CLINICAL DATA:  Status post reversed right shoulder arthroplasty. EXAM: PORTABLE RIGHT SHOULDER COMPARISON:  CT scan 03/23/2016 FINDINGS: Well seated components of the reverse total right shoulder arthroplasty. No complicating features are demonstrated. The visualized right lung is clear. IMPRESSION: Well seated components of a reverse total right shoulder arthroplasty. No complicating features. Electronically Signed   By: Marijo Sanes M.D.   On: 05/11/2016 11:26    ASSESSMENT/PLAN:  Right rotator cuff arthropathy S/Pright shoulder reverse arthroplasty - for Home health PT and OT, for therapeutic strengthening exercises; will discontinue Xarelto 10 mg 1 tab PO Q D upon discharge since patient had it already for 21 days by then for DVT prophylaxis; Oxycodone 5 mg 1-2 tabs PO Q 4 hours PRN, Tylenol 325 mg take 2 tabs = 650 mg PO Q 6 hours PRN for pain  and discontinue Ultram upon discharge; Baclofen 10 mg 1 tab PO Q 8 hours PRN for muscle spasm; follow-up with orthopedic surgeon  Anemia, acute blood loss -  stable Lab Results  Component Value Date   HGB 10.3 (A) 05/17/2016    Bilateral lower extremity edema - continue Lasix 40 mg 1 tab PO Q D  Hypertension - BPs have been  low, 101/68, 106/68, 97/55 and 98/55; Norvasc has been held for several days; discontinue Norvasc  Hypothyroidism - continue Synthroid 88 mcg 1 tab PO Q D  GERD - continue Prilosec 20 mg 1 tab PO Q D  Osteoarthritis of bilateral knees - continue Voltaren 1% gel apply 2 gm to bilateral knees BID PRN  Anxiety - mood is stable; continue Xanax 0.25 mg 1 tab PO Q HS PRN  Allergic rhinitis - continue Claritin 10 mg 1 tab PO Q D PRN  Constipation - continue Senna-S 8.6-50 mg 2 tabs PO Q HS and Miralax 17 gm PO Q D  Hypokalemia - continue KCL 10 meq 1 tab PO Q D Lab Results  Component Value Date   K 4.1 05/17/2016      I have filled out patient's discharge paperwork and written prescriptions.  Patient will receive home health PT and OT.  DME provided:  None  Total discharge time: Less than 30 minutes  Discharge time involved coordination of the discharge process with social worker, nursing staff and therapy department. Medical justification for home health services verified.     Durenda Age, NP [employer] [phone]

## 2016-05-31 NOTE — Progress Notes (Signed)
This encounter was created in error - please disregard.

## 2016-06-23 DIAGNOSIS — M810 Age-related osteoporosis without current pathological fracture: Secondary | ICD-10-CM | POA: Diagnosis not present

## 2016-06-23 DIAGNOSIS — Z471 Aftercare following joint replacement surgery: Secondary | ICD-10-CM | POA: Diagnosis not present

## 2016-06-23 DIAGNOSIS — M62838 Other muscle spasm: Secondary | ICD-10-CM | POA: Diagnosis not present

## 2016-06-23 DIAGNOSIS — M797 Fibromyalgia: Secondary | ICD-10-CM | POA: Diagnosis not present

## 2016-06-30 ENCOUNTER — Telehealth (HOSPITAL_COMMUNITY): Payer: Self-pay | Admitting: Pulmonary Disease

## 2016-06-30 NOTE — Telephone Encounter (Signed)
/  29/17 pt cx because she said that she would be receiving home health.

## 2016-07-05 ENCOUNTER — Ambulatory Visit (HOSPITAL_COMMUNITY): Payer: Commercial Managed Care - HMO | Admitting: Specialist

## 2016-07-05 ENCOUNTER — Other Ambulatory Visit: Payer: Self-pay | Admitting: Internal Medicine

## 2016-08-10 DIAGNOSIS — M2550 Pain in unspecified joint: Secondary | ICD-10-CM | POA: Diagnosis not present

## 2016-08-10 DIAGNOSIS — M0609 Rheumatoid arthritis without rheumatoid factor, multiple sites: Secondary | ICD-10-CM | POA: Diagnosis not present

## 2016-08-10 DIAGNOSIS — M150 Primary generalized (osteo)arthritis: Secondary | ICD-10-CM | POA: Diagnosis not present

## 2016-08-10 DIAGNOSIS — Z6828 Body mass index (BMI) 28.0-28.9, adult: Secondary | ICD-10-CM | POA: Diagnosis not present

## 2016-08-10 DIAGNOSIS — E663 Overweight: Secondary | ICD-10-CM | POA: Diagnosis not present

## 2016-08-11 ENCOUNTER — Other Ambulatory Visit: Payer: Self-pay | Admitting: Internal Medicine

## 2016-08-12 LAB — FECAL OCCULT BLOOD, GUAIAC: Fecal Occult Blood: NEGATIVE

## 2016-08-13 DIAGNOSIS — Z1211 Encounter for screening for malignant neoplasm of colon: Secondary | ICD-10-CM | POA: Diagnosis not present

## 2016-08-30 ENCOUNTER — Other Ambulatory Visit (HOSPITAL_COMMUNITY): Payer: Self-pay | Admitting: Pulmonary Disease

## 2016-08-30 DIAGNOSIS — M19012 Primary osteoarthritis, left shoulder: Secondary | ICD-10-CM | POA: Diagnosis not present

## 2016-08-30 DIAGNOSIS — Z1231 Encounter for screening mammogram for malignant neoplasm of breast: Secondary | ICD-10-CM

## 2016-09-02 DIAGNOSIS — Z9884 Bariatric surgery status: Secondary | ICD-10-CM | POA: Diagnosis not present

## 2016-09-02 DIAGNOSIS — R1013 Epigastric pain: Secondary | ICD-10-CM | POA: Diagnosis not present

## 2016-09-06 ENCOUNTER — Ambulatory Visit (HOSPITAL_COMMUNITY)
Admission: RE | Admit: 2016-09-06 | Discharge: 2016-09-06 | Disposition: A | Discharge status: Home / Self Care | Payer: Medicare HMO | Source: Ambulatory Visit | Attending: Pulmonary Disease | Admitting: Pulmonary Disease

## 2016-09-06 DIAGNOSIS — Z1231 Encounter for screening mammogram for malignant neoplasm of breast: Secondary | ICD-10-CM

## 2016-09-09 ENCOUNTER — Other Ambulatory Visit (HOSPITAL_COMMUNITY): Payer: Self-pay | Admitting: Pulmonary Disease

## 2016-09-09 DIAGNOSIS — R928 Other abnormal and inconclusive findings on diagnostic imaging of breast: Secondary | ICD-10-CM

## 2016-09-10 ENCOUNTER — Other Ambulatory Visit: Payer: Self-pay | Admitting: Internal Medicine

## 2016-09-16 DIAGNOSIS — Z9884 Bariatric surgery status: Secondary | ICD-10-CM | POA: Diagnosis not present

## 2016-09-16 DIAGNOSIS — K219 Gastro-esophageal reflux disease without esophagitis: Secondary | ICD-10-CM | POA: Diagnosis not present

## 2016-09-16 DIAGNOSIS — Z9889 Other specified postprocedural states: Secondary | ICD-10-CM | POA: Diagnosis not present

## 2016-09-16 DIAGNOSIS — Z9049 Acquired absence of other specified parts of digestive tract: Secondary | ICD-10-CM | POA: Diagnosis not present

## 2016-09-16 DIAGNOSIS — K289 Gastrojejunal ulcer, unspecified as acute or chronic, without hemorrhage or perforation: Secondary | ICD-10-CM | POA: Diagnosis not present

## 2016-09-16 DIAGNOSIS — K5730 Diverticulosis of large intestine without perforation or abscess without bleeding: Secondary | ICD-10-CM | POA: Diagnosis not present

## 2016-09-16 DIAGNOSIS — Z1211 Encounter for screening for malignant neoplasm of colon: Secondary | ICD-10-CM | POA: Diagnosis not present

## 2016-09-16 DIAGNOSIS — K2950 Unspecified chronic gastritis without bleeding: Secondary | ICD-10-CM | POA: Diagnosis not present

## 2016-09-16 DIAGNOSIS — Z980 Intestinal bypass and anastomosis status: Secondary | ICD-10-CM | POA: Diagnosis not present

## 2016-09-16 DIAGNOSIS — K228 Other specified diseases of esophagus: Secondary | ICD-10-CM | POA: Diagnosis not present

## 2016-09-21 ENCOUNTER — Ambulatory Visit (HOSPITAL_COMMUNITY)
Admission: RE | Admit: 2016-09-21 | Discharge: 2016-09-21 | Disposition: A | Discharge status: Home / Self Care | Payer: Medicare HMO | Source: Ambulatory Visit | Attending: Pulmonary Disease | Admitting: Pulmonary Disease

## 2016-09-21 DIAGNOSIS — R928 Other abnormal and inconclusive findings on diagnostic imaging of breast: Secondary | ICD-10-CM | POA: Diagnosis not present

## 2016-09-21 DIAGNOSIS — R921 Mammographic calcification found on diagnostic imaging of breast: Secondary | ICD-10-CM | POA: Diagnosis not present

## 2016-09-22 DIAGNOSIS — I10 Essential (primary) hypertension: Secondary | ICD-10-CM | POA: Diagnosis not present

## 2016-09-22 DIAGNOSIS — M0609 Rheumatoid arthritis without rheumatoid factor, multiple sites: Secondary | ICD-10-CM | POA: Diagnosis not present

## 2016-09-22 DIAGNOSIS — M1990 Unspecified osteoarthritis, unspecified site: Secondary | ICD-10-CM | POA: Diagnosis not present

## 2016-09-22 DIAGNOSIS — Z9884 Bariatric surgery status: Secondary | ICD-10-CM | POA: Diagnosis not present

## 2016-09-24 ENCOUNTER — Other Ambulatory Visit: Payer: Self-pay | Admitting: Orthopedic Surgery

## 2016-10-14 DIAGNOSIS — Z0100 Encounter for examination of eyes and vision without abnormal findings: Secondary | ICD-10-CM | POA: Diagnosis not present

## 2016-10-14 DIAGNOSIS — H2510 Age-related nuclear cataract, unspecified eye: Secondary | ICD-10-CM | POA: Diagnosis not present

## 2016-10-19 DIAGNOSIS — K279 Peptic ulcer, site unspecified, unspecified as acute or chronic, without hemorrhage or perforation: Secondary | ICD-10-CM | POA: Diagnosis not present

## 2016-10-21 DIAGNOSIS — K228 Other specified diseases of esophagus: Secondary | ICD-10-CM | POA: Diagnosis not present

## 2016-10-21 DIAGNOSIS — Z8711 Personal history of peptic ulcer disease: Secondary | ICD-10-CM | POA: Diagnosis not present

## 2016-10-21 DIAGNOSIS — Z09 Encounter for follow-up examination after completed treatment for conditions other than malignant neoplasm: Secondary | ICD-10-CM | POA: Diagnosis not present

## 2016-10-21 DIAGNOSIS — K219 Gastro-esophageal reflux disease without esophagitis: Secondary | ICD-10-CM | POA: Diagnosis not present

## 2016-10-29 ENCOUNTER — Encounter (HOSPITAL_COMMUNITY): Payer: Self-pay

## 2016-10-29 ENCOUNTER — Encounter (HOSPITAL_COMMUNITY)
Admission: RE | Admit: 2016-10-29 | Discharge: 2016-10-29 | Disposition: A | Discharge status: Home / Self Care | Payer: Medicare HMO | Source: Ambulatory Visit | Attending: Orthopedic Surgery | Admitting: Orthopedic Surgery

## 2016-10-29 DIAGNOSIS — M810 Age-related osteoporosis without current pathological fracture: Secondary | ICD-10-CM | POA: Diagnosis not present

## 2016-10-29 DIAGNOSIS — Z90710 Acquired absence of both cervix and uterus: Secondary | ICD-10-CM | POA: Diagnosis not present

## 2016-10-29 DIAGNOSIS — K5790 Diverticulosis of intestine, part unspecified, without perforation or abscess without bleeding: Secondary | ICD-10-CM | POA: Diagnosis not present

## 2016-10-29 DIAGNOSIS — Z79891 Long term (current) use of opiate analgesic: Secondary | ICD-10-CM | POA: Insufficient documentation

## 2016-10-29 DIAGNOSIS — Z79899 Other long term (current) drug therapy: Secondary | ICD-10-CM | POA: Insufficient documentation

## 2016-10-29 DIAGNOSIS — I10 Essential (primary) hypertension: Secondary | ICD-10-CM | POA: Insufficient documentation

## 2016-10-29 DIAGNOSIS — Z9049 Acquired absence of other specified parts of digestive tract: Secondary | ICD-10-CM | POA: Diagnosis not present

## 2016-10-29 DIAGNOSIS — Z01812 Encounter for preprocedural laboratory examination: Secondary | ICD-10-CM | POA: Insufficient documentation

## 2016-10-29 DIAGNOSIS — Z01810 Encounter for preprocedural cardiovascular examination: Secondary | ICD-10-CM | POA: Diagnosis not present

## 2016-10-29 DIAGNOSIS — E039 Hypothyroidism, unspecified: Secondary | ICD-10-CM | POA: Insufficient documentation

## 2016-10-29 DIAGNOSIS — I4510 Unspecified right bundle-branch block: Secondary | ICD-10-CM | POA: Insufficient documentation

## 2016-10-29 HISTORY — DX: Gastric ulcer, unspecified as acute or chronic, without hemorrhage or perforation: K25.9

## 2016-10-29 HISTORY — DX: Insomnia, unspecified: G47.00

## 2016-10-29 HISTORY — DX: Anemia, unspecified: D64.9

## 2016-10-29 HISTORY — DX: Gastro-esophageal reflux disease without esophagitis: K21.9

## 2016-10-29 LAB — BASIC METABOLIC PANEL
Anion gap: 7 (ref 5–15)
BUN: 9 mg/dL (ref 6–20)
CO2: 25 mmol/L (ref 22–32)
Calcium: 9.5 mg/dL (ref 8.9–10.3)
Chloride: 106 mmol/L (ref 101–111)
Creatinine, Ser: 0.68 mg/dL (ref 0.44–1.00)
GFR calc Af Amer: >60 mL/min (ref >60)
GFR calc non Af Amer: >60 mL/min (ref >60)
Glucose, Bld: 85 mg/dL (ref 65–99)
Potassium: 4.2 mmol/L (ref 3.5–5.1)
Sodium: 138 mmol/L (ref 135–145)

## 2016-10-29 LAB — CBC
HCT: 34.1 % — ABNORMAL LOW (ref 36.0–46.0)
Hemoglobin: 11.3 g/dL — ABNORMAL LOW (ref 12.0–15.0)
MCH: 30.9 pg (ref 26.0–34.0)
MCHC: 33.1 g/dL (ref 30.0–36.0)
MCV: 93.2 fL (ref 78.0–100.0)
Platelets: 261 10*3/uL (ref 150–400)
RBC: 3.66 MIL/uL — ABNORMAL LOW (ref 3.87–5.11)
RDW: 15.2 % (ref 11.5–15.5)
WBC: 4.3 10*3/uL (ref 4.0–10.5)

## 2016-10-29 LAB — SURGICAL PCR SCREEN
MRSA, PCR: NEGATIVE
Staphylococcus aureus: NEGATIVE

## 2016-10-29 NOTE — Progress Notes (Signed)
Anesthesia Chart Review: Patient is a 76 year old female scheduled for left total shoulder arthroplasty on 11/10/15 by Dr. Mardelle Matte.   History includes nonsmoker, postoperative nausea and vomiting, thyroidectomy with secondary hypothyroidism, hypertension, diverticulosis, peripheral edema treated with Lasix PRN, headaches, osteoporosis, RA, anxiety, anemia from GIB, hysterectomy, gastric bypass surgery 09/05/09 [facility]), cholecystectomy, left THA with revision 10/03/13, right total shoulder 05/11/16. She reports a family history of "blood clots" (sister had a fatal one after surgery and father had a leg DVT after MVA; nephew with sickle cell anemia).  PCP is Dr. Sinda Du in [place].  GI is Dr. Richmond Campbell. 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 Biopsies showed no dysplasia and no H. Pylori. She was  treated with PPI X 4 weeks with recommendation for follow-up EGD to ensure resolution of ulcer. Patient reported that this was done on 10/21/16 (report requested), and she was told that the ulcer had completely healed.   Meds include Fosamax, Xanax, amlodipine, ASA 81 mg (on hold), Lasix, Plaquenil, fish oil, Prilosec, Synthroid, tramadol, trazodone.  BP 123/69   Pulse 73   Temp 36.8 C (Oral)   Resp (!) 22   Ht 5\' 1"  (1.549 m)   Wt 146 lb (66.2 kg)   SpO2 100%   BMI 27.59 kg/m   EKG 10/29/16: NSR, right BBB. 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   Stress Echo 05/01/09 [facility]; Care Everywhere): Negative dobutamine stress echo with no echocardiographic evidence of inducible ischemia. MHR achieved 84%. 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   Preoperative labs noted.   EKG appears stable. She tolerated THA 05/2016. If no acute changes then I would anticipate that she could proceed as planned.  George Hugh [facility] Short Stay Center/Anesthesiology Phone [phone] 10/29/2016 6:23  PM

## 2016-10-29 NOTE — Pre-Procedure Instructions (Signed)
Vesta  10/29/2016      [facility] - [place], [place] [address] Phone: [phone] Fax: [phone]    Your procedure is scheduled on Tuesday, April 10th    Report to [facility] Admitting at 8:40 A.M.             (posted surgery time 10:41am - 1:28 pm)   Call this number if you have problems the MORNING of surgery:  [phone], or [phone] Mon - Fri from 8-4:30   Remember:  Do not eat food or drink liquids after midnight Monday.   Take these medicines the morning of surgery with A SIP OF WATER   Alprazolam if needed, amlodipine (norvasc) if needed, omeprazole (prilosec), synthroid, tramadol if needed           (stop 7 days prior to surgery- aspirin or aspirin products, ibuprofen/ advil/ motrin, diclofenac/ voltaren gel, multivitamin, omega3 / fish oil, herbal medicines)   Do not wear jewelry, make-up or nail polish.  Do not wear lotions, powders,  perfumes, or deoderant.  Do not shave 48 hours prior to surgery.     Do not bring valuables to the hospital.  [facility] is not responsible for any belongings or valuables.  Contacts, dentures or bridgework may not be worn into surgery.  Leave your suitcase in the car.  After surgery it may be brought to your room.  For patients admitted to the hospital, discharge time will be determined by your treatment team.    [facility] - Preparing for Surgery  Before surgery, you can play an important role.  Because skin is not sterile, your skin needs to be as free of germs as possible.  You can reduce the number of germs on you skin by washing with CHG (chlorahexidine gluconate) soap before surgery.  CHG is an antiseptic cleaner which kills germs and bonds with the skin to continue killing germs even after washing.  Please DO NOT use if you have an allergy to CHG or antibacterial soaps.  If your skin becomes reddened/irritated stop using the CHG and inform  your nurse when you arrive at Short Stay.  Do not shave (including legs and underarms) for at least 48 hours prior to the first CHG shower.  You may shave your face.  Please follow these instructions carefully:   1.  Shower with CHG Soap the night before surgery and the                                morning of Surgery.  2.  If you choose to wash your hair, wash your hair first as usual with your       normal shampoo.  3.  After you shampoo, rinse your hair and body thoroughly to remove the                      Shampoo.  4.  Use CHG as you would any other liquid soap.  You can apply chg directly       to the skin and wash gently with scrungie or a clean washcloth.  5.  Apply the CHG Soap to your body ONLY FROM THE NECK DOWN.        Do not use on open wounds or open sores.  Avoid contact with your eyes,  ears, mouth and genitals (private parts).  Wash genitals (private parts)       with your normal soap.   6.  Wash thoroughly, paying special attention to the area where your surgery        will be performed.   7.  Thoroughly rinse your body with warm water from the neck down.  8.  DO NOT shower/wash with your normal soap after using and rinsing off       the CHG Soap.  9.  Pat yourself dry with a clean towel.            10.  Wear clean pajamas.            11.  Place clean sheets on your bed the night of your first shower and do not        sleep with pets.  Day of Surgery  Do not apply any lotions/deoderants the morning of surgery.  Please wear clean clothes to the hospital/surgery center.  Please read over the following fact sheets that you were given. Pain Booklet and Surgical Site Infection Prevention

## 2016-10-31 HISTORY — PX: TOTAL SHOULDER REPLACEMENT: SUR1217

## 2016-11-08 MED ORDER — CEFAZOLIN SODIUM-DEXTROSE 2-4 GM/100ML-% IV SOLN
2.0000 g | INTRAVENOUS | Status: AC
  Administered 2016-11-09: 2 g via INTRAVENOUS
  Filled 2016-11-08: qty 100

## 2016-11-08 NOTE — Progress Notes (Signed)
Received call back from [facility] states that the form we faxed was faxed to the Dr's office and to fax to [phone] faxed to this number.

## 2016-11-08 NOTE — Progress Notes (Signed)
Dr Earlean Shawl office called message left with med records.to send over EGD done 10-21-16, per requested by Myra Gianotti, PA-C

## 2016-11-09 ENCOUNTER — Encounter (HOSPITAL_COMMUNITY)
Admission: RE | Disposition: A | Discharge status: Home / Self Care | Payer: Self-pay | Source: Ambulatory Visit | Attending: Orthopedic Surgery

## 2016-11-09 ENCOUNTER — Inpatient Hospital Stay (HOSPITAL_COMMUNITY): Payer: Medicare HMO | Admitting: Vascular Surgery

## 2016-11-09 ENCOUNTER — Encounter (HOSPITAL_COMMUNITY): Payer: Self-pay | Admitting: Certified Registered Nurse Anesthetist

## 2016-11-09 ENCOUNTER — Inpatient Hospital Stay (HOSPITAL_COMMUNITY): Payer: Medicare HMO | Admitting: Certified Registered Nurse Anesthetist

## 2016-11-09 ENCOUNTER — Inpatient Hospital Stay (HOSPITAL_COMMUNITY): Payer: Medicare HMO

## 2016-11-09 ENCOUNTER — Inpatient Hospital Stay (HOSPITAL_COMMUNITY)
Admission: RE | Admit: 2016-11-09 | Discharge: 2016-11-12 | DRG: 483 | Disposition: A | Discharge status: Home / Self Care | Payer: Medicare HMO | Source: Ambulatory Visit | Attending: Orthopedic Surgery | Admitting: Orthopedic Surgery

## 2016-11-09 DIAGNOSIS — Z96619 Presence of unspecified artificial shoulder joint: Secondary | ICD-10-CM

## 2016-11-09 DIAGNOSIS — Z7983 Long term (current) use of bisphosphonates: Secondary | ICD-10-CM

## 2016-11-09 DIAGNOSIS — M810 Age-related osteoporosis without current pathological fracture: Secondary | ICD-10-CM | POA: Diagnosis present

## 2016-11-09 DIAGNOSIS — K219 Gastro-esophageal reflux disease without esophagitis: Secondary | ICD-10-CM | POA: Diagnosis present

## 2016-11-09 DIAGNOSIS — E890 Postprocedural hypothyroidism: Secondary | ICD-10-CM | POA: Diagnosis present

## 2016-11-09 DIAGNOSIS — Z79899 Other long term (current) drug therapy: Secondary | ICD-10-CM | POA: Diagnosis not present

## 2016-11-09 DIAGNOSIS — Z8711 Personal history of peptic ulcer disease: Secondary | ICD-10-CM

## 2016-11-09 DIAGNOSIS — E785 Hyperlipidemia, unspecified: Secondary | ICD-10-CM | POA: Diagnosis not present

## 2016-11-09 DIAGNOSIS — Z9049 Acquired absence of other specified parts of digestive tract: Secondary | ICD-10-CM

## 2016-11-09 DIAGNOSIS — M75102 Unspecified rotator cuff tear or rupture of left shoulder, not specified as traumatic: Principal | ICD-10-CM | POA: Diagnosis present

## 2016-11-09 DIAGNOSIS — Z9842 Cataract extraction status, left eye: Secondary | ICD-10-CM

## 2016-11-09 DIAGNOSIS — Z90710 Acquired absence of both cervix and uterus: Secondary | ICD-10-CM | POA: Diagnosis not present

## 2016-11-09 DIAGNOSIS — Z96642 Presence of left artificial hip joint: Secondary | ICD-10-CM | POA: Diagnosis present

## 2016-11-09 DIAGNOSIS — Z471 Aftercare following joint replacement surgery: Secondary | ICD-10-CM | POA: Diagnosis not present

## 2016-11-09 DIAGNOSIS — M12811 Other specific arthropathies, not elsewhere classified, right shoulder: Secondary | ICD-10-CM | POA: Diagnosis not present

## 2016-11-09 DIAGNOSIS — Z7982 Long term (current) use of aspirin: Secondary | ICD-10-CM | POA: Diagnosis not present

## 2016-11-09 DIAGNOSIS — Z9841 Cataract extraction status, right eye: Secondary | ICD-10-CM

## 2016-11-09 DIAGNOSIS — Z888 Allergy status to other drugs, medicaments and biological substances status: Secondary | ICD-10-CM

## 2016-11-09 DIAGNOSIS — M19012 Primary osteoarthritis, left shoulder: Secondary | ICD-10-CM | POA: Diagnosis not present

## 2016-11-09 DIAGNOSIS — R69 Illness, unspecified: Secondary | ICD-10-CM | POA: Diagnosis not present

## 2016-11-09 DIAGNOSIS — Z9884 Bariatric surgery status: Secondary | ICD-10-CM

## 2016-11-09 DIAGNOSIS — M12812 Other specific arthropathies, not elsewhere classified, left shoulder: Secondary | ICD-10-CM

## 2016-11-09 DIAGNOSIS — D649 Anemia, unspecified: Secondary | ICD-10-CM | POA: Diagnosis not present

## 2016-11-09 DIAGNOSIS — I10 Essential (primary) hypertension: Secondary | ICD-10-CM | POA: Diagnosis not present

## 2016-11-09 DIAGNOSIS — M129 Arthropathy, unspecified: Secondary | ICD-10-CM | POA: Diagnosis not present

## 2016-11-09 DIAGNOSIS — Z96649 Presence of unspecified artificial hip joint: Secondary | ICD-10-CM | POA: Diagnosis not present

## 2016-11-09 DIAGNOSIS — Z96612 Presence of left artificial shoulder joint: Secondary | ICD-10-CM | POA: Diagnosis not present

## 2016-11-09 DIAGNOSIS — E039 Hypothyroidism, unspecified: Secondary | ICD-10-CM | POA: Diagnosis not present

## 2016-11-09 HISTORY — DX: Other specific arthropathies, not elsewhere classified, left shoulder: M12.812

## 2016-11-09 HISTORY — PX: TOTAL SHOULDER ARTHROPLASTY: SHX126

## 2016-11-09 HISTORY — DX: Unspecified rotator cuff tear or rupture of left shoulder, not specified as traumatic: M75.102

## 2016-11-09 SURGERY — ARTHROPLASTY, SHOULDER, TOTAL
Anesthesia: General | Site: Shoulder | Laterality: Left

## 2016-11-09 MED ORDER — SUGAMMADEX SODIUM 200 MG/2ML IV SOLN
INTRAVENOUS | Status: DC | PRN
  Administered 2016-11-09: 125 mg via INTRAVENOUS

## 2016-11-09 MED ORDER — PHENOL 1.4 % MT LIQD: 1.0000 | OROMUCOSAL | Status: DC | PRN

## 2016-11-09 MED ORDER — HYDROXYCHLOROQUINE SULFATE 200 MG PO TABS
200.0000 mg | ORAL_TABLET | Freq: Two times a day (BID) | ORAL | Status: DC
  Administered 2016-11-09 – 2016-11-12 (×6): 200 mg via ORAL
  Filled 2016-11-09 (×7): qty 1

## 2016-11-09 MED ORDER — METOCLOPRAMIDE HCL 5 MG/ML IJ SOLN: 5.0000 mg | Freq: Three times a day (TID) | INTRAMUSCULAR | Status: DC | PRN

## 2016-11-09 MED ORDER — MIDAZOLAM HCL 2 MG/2ML IJ SOLN
1.0000 mg | Freq: Once | INTRAMUSCULAR | Status: DC
  Filled 2016-11-09: qty 1

## 2016-11-09 MED ORDER — FENTANYL CITRATE (PF) 100 MCG/2ML IJ SOLN
INTRAMUSCULAR | Status: AC
  Administered 2016-11-09: 50 ug
  Filled 2016-11-09: qty 2

## 2016-11-09 MED ORDER — DIPHENHYDRAMINE HCL 12.5 MG/5ML PO ELIX: 12.5000 mg | ORAL_SOLUTION | ORAL | Status: DC | PRN

## 2016-11-09 MED ORDER — MAGNESIUM CITRATE PO SOLN: 1.0000 | Freq: Once | ORAL | Status: DC | PRN

## 2016-11-09 MED ORDER — POLYETHYLENE GLYCOL 3350 17 G PO PACK
17.0000 g | PACK | Freq: Every day | ORAL | Status: DC | PRN
Start: 2016-11-09 — End: 2016-11-12

## 2016-11-09 MED ORDER — LACTATED RINGERS IV SOLN
INTRAVENOUS | Status: DC
  Administered 2016-11-09 (×2): via INTRAVENOUS

## 2016-11-09 MED ORDER — ROCURONIUM BROMIDE 50 MG/5ML IV SOSY
PREFILLED_SYRINGE | INTRAVENOUS | Status: AC
  Filled 2016-11-09: qty 5

## 2016-11-09 MED ORDER — DEXAMETHASONE SODIUM PHOSPHATE 10 MG/ML IJ SOLN
INTRAMUSCULAR | Status: DC | PRN
  Administered 2016-11-09: 5 mg via INTRAVENOUS

## 2016-11-09 MED ORDER — CALCIUM CARBONATE 1250 (500 CA) MG PO TABS
1250.0000 mg | ORAL_TABLET | Freq: Every day | ORAL | Status: DC
  Administered 2016-11-10 – 2016-11-11 (×2): 1250 mg via ORAL
  Filled 2016-11-09: qty 1

## 2016-11-09 MED ORDER — PROPOFOL 10 MG/ML IV BOLUS
INTRAVENOUS | Status: DC | PRN
  Administered 2016-11-09: 130 mg via INTRAVENOUS

## 2016-11-09 MED ORDER — LIDOCAINE HCL (CARDIAC) 20 MG/ML IV SOLN
INTRAVENOUS | Status: DC | PRN
Start: 2016-11-09 — End: 2016-11-09
  Administered 2016-11-09: 20 mg via INTRAVENOUS

## 2016-11-09 MED ORDER — METOCLOPRAMIDE HCL 5 MG PO TABS: 5.0000 mg | ORAL_TABLET | Freq: Three times a day (TID) | ORAL | Status: DC | PRN

## 2016-11-09 MED ORDER — ONDANSETRON HCL 4 MG PO TABS: 4.0000 mg | ORAL_TABLET | Freq: Three times a day (TID) | ORAL | 0 refills | Status: DC | PRN

## 2016-11-09 MED ORDER — FUROSEMIDE 40 MG PO TABS
40.0000 mg | ORAL_TABLET | Freq: Every day | ORAL | Status: DC
  Administered 2016-11-10 – 2016-11-12 (×3): 40 mg via ORAL
  Filled 2016-11-09 (×3): qty 1

## 2016-11-09 MED ORDER — CEFAZOLIN IN D5W 1 GM/50ML IV SOLN
1.0000 g | Freq: Four times a day (QID) | INTRAVENOUS | Status: AC
  Administered 2016-11-09 – 2016-11-10 (×3): 1 g via INTRAVENOUS
  Filled 2016-11-09 (×3): qty 50

## 2016-11-09 MED ORDER — TRAZODONE HCL 50 MG PO TABS
50.0000 mg | ORAL_TABLET | Freq: Every day | ORAL | Status: DC
  Administered 2016-11-09 – 2016-11-11 (×3): 50 mg via ORAL
  Filled 2016-11-09 (×3): qty 1

## 2016-11-09 MED ORDER — FENTANYL CITRATE (PF) 250 MCG/5ML IJ SOLN
INTRAMUSCULAR | Status: AC
  Filled 2016-11-09: qty 5

## 2016-11-09 MED ORDER — CALCIUM CARBONATE 1250 (500 CA) MG PO TABS
2500.0000 mg | ORAL_TABLET | Freq: Two times a day (BID) | ORAL | Status: DC
Start: 2016-11-09 — End: 2016-11-12
  Administered 2016-11-09 – 2016-11-12 (×5): 2500 mg via ORAL
  Filled 2016-11-09 (×8): qty 2

## 2016-11-09 MED ORDER — ROCURONIUM BROMIDE 100 MG/10ML IV SOLN
INTRAVENOUS | Status: DC | PRN
  Administered 2016-11-09: 50 mg via INTRAVENOUS

## 2016-11-09 MED ORDER — METHOCARBAMOL 1000 MG/10ML IJ SOLN: 500.0000 mg | Freq: Four times a day (QID) | INTRAVENOUS | Status: DC | PRN

## 2016-11-09 MED ORDER — LEVOTHYROXINE SODIUM 100 MCG PO TABS
100.0000 ug | ORAL_TABLET | Freq: Every day | ORAL | Status: DC
  Administered 2016-11-10 – 2016-11-12 (×3): 100 ug via ORAL
  Filled 2016-11-09 (×3): qty 1

## 2016-11-09 MED ORDER — AMLODIPINE BESYLATE 5 MG PO TABS: 5.0000 mg | ORAL_TABLET | Freq: Every day | ORAL | Status: DC | PRN

## 2016-11-09 MED ORDER — ONDANSETRON HCL 4 MG/2ML IJ SOLN: 4.0000 mg | Freq: Once | INTRAMUSCULAR | Status: DC | PRN

## 2016-11-09 MED ORDER — SENNA 8.6 MG PO TABS
1.0000 | ORAL_TABLET | Freq: Two times a day (BID) | ORAL | Status: DC
  Administered 2016-11-09 – 2016-11-12 (×6): 8.6 mg via ORAL
  Filled 2016-11-09 (×6): qty 1

## 2016-11-09 MED ORDER — MIDAZOLAM HCL 2 MG/2ML IJ SOLN
INTRAMUSCULAR | Status: AC
  Administered 2016-11-09: 1 mg
  Filled 2016-11-09: qty 2

## 2016-11-09 MED ORDER — OXYCODONE HCL 5 MG PO TABS: 5.0000 mg | ORAL_TABLET | ORAL | 0 refills | Status: DC | PRN

## 2016-11-09 MED ORDER — FENTANYL CITRATE (PF) 100 MCG/2ML IJ SOLN
50.0000 ug | Freq: Once | INTRAMUSCULAR | Status: DC
  Filled 2016-11-09: qty 1

## 2016-11-09 MED ORDER — ONDANSETRON HCL 4 MG PO TABS: 4.0000 mg | ORAL_TABLET | Freq: Four times a day (QID) | ORAL | Status: DC | PRN

## 2016-11-09 MED ORDER — ARTIFICIAL TEARS OP OINT
TOPICAL_OINTMENT | OPHTHALMIC | Status: AC
  Filled 2016-11-09: qty 3.5

## 2016-11-09 MED ORDER — ONDANSETRON HCL 4 MG/2ML IJ SOLN
INTRAMUSCULAR | Status: DC | PRN
  Administered 2016-11-09: 4 mg via INTRAVENOUS

## 2016-11-09 MED ORDER — OXYCODONE HCL 5 MG/5ML PO SOLN: 5.0000 mg | Freq: Once | ORAL | Status: DC | PRN

## 2016-11-09 MED ORDER — ADULT MULTIVITAMIN W/MINERALS CH
1.0000 | ORAL_TABLET | Freq: Every day | ORAL | Status: DC
  Administered 2016-11-10 – 2016-11-12 (×3): 1 via ORAL
  Filled 2016-11-09 (×3): qty 1

## 2016-11-09 MED ORDER — FENTANYL CITRATE (PF) 100 MCG/2ML IJ SOLN
INTRAMUSCULAR | Status: DC | PRN
  Administered 2016-11-09: 50 ug via INTRAVENOUS

## 2016-11-09 MED ORDER — ONDANSETRON HCL 4 MG/2ML IJ SOLN
INTRAMUSCULAR | Status: AC
  Filled 2016-11-09: qty 2

## 2016-11-09 MED ORDER — SENNA-DOCUSATE SODIUM 8.6-50 MG PO TABS: 2.0000 | ORAL_TABLET | Freq: Every day | ORAL | 1 refills | Status: DC

## 2016-11-09 MED ORDER — BISACODYL 10 MG RE SUPP: 10.0000 mg | Freq: Every day | RECTAL | Status: DC | PRN

## 2016-11-09 MED ORDER — LIDOCAINE 2% (20 MG/ML) 5 ML SYRINGE
INTRAMUSCULAR | Status: AC
  Filled 2016-11-09: qty 5

## 2016-11-09 MED ORDER — MENTHOL 3 MG MT LOZG
1.0000 | LOZENGE | OROMUCOSAL | Status: DC | PRN
Start: 2016-11-09 — End: 2016-11-12

## 2016-11-09 MED ORDER — POTASSIUM CHLORIDE IN NACL 20-0.45 MEQ/L-% IV SOLN
INTRAVENOUS | Status: DC
  Administered 2016-11-09: 16:00:00 via INTRAVENOUS
  Filled 2016-11-09 (×2): qty 1000

## 2016-11-09 MED ORDER — DOCUSATE SODIUM 100 MG PO CAPS
100.0000 mg | ORAL_CAPSULE | Freq: Two times a day (BID) | ORAL | Status: DC
  Administered 2016-11-09 – 2016-11-12 (×6): 100 mg via ORAL
  Filled 2016-11-09 (×6): qty 1

## 2016-11-09 MED ORDER — SUGAMMADEX SODIUM 200 MG/2ML IV SOLN
INTRAVENOUS | Status: AC
  Filled 2016-11-09: qty 2

## 2016-11-09 MED ORDER — ZOLPIDEM TARTRATE 5 MG PO TABS: 5.0000 mg | ORAL_TABLET | Freq: Every evening | ORAL | Status: DC | PRN

## 2016-11-09 MED ORDER — ASPIRIN EC 81 MG PO TBEC
81.0000 mg | DELAYED_RELEASE_TABLET | Freq: Every day | ORAL | Status: DC
  Administered 2016-11-10 – 2016-11-12 (×3): 81 mg via ORAL
  Filled 2016-11-09 (×3): qty 1

## 2016-11-09 MED ORDER — PHENYLEPHRINE HCL 10 MG/ML IJ SOLN
INTRAMUSCULAR | Status: DC | PRN
  Administered 2016-11-09: 20 ug/min via INTRAVENOUS

## 2016-11-09 MED ORDER — METHOCARBAMOL 500 MG PO TABS
500.0000 mg | ORAL_TABLET | Freq: Four times a day (QID) | ORAL | Status: DC | PRN
  Administered 2016-11-10 – 2016-11-12 (×3): 500 mg via ORAL
  Filled 2016-11-09 (×3): qty 1

## 2016-11-09 MED ORDER — TRAMADOL HCL 50 MG PO TABS: 50.0000 mg | ORAL_TABLET | Freq: Four times a day (QID) | ORAL | Status: DC | PRN

## 2016-11-09 MED ORDER — ONDANSETRON HCL 4 MG/2ML IJ SOLN: 4.0000 mg | Freq: Four times a day (QID) | INTRAMUSCULAR | Status: DC | PRN

## 2016-11-09 MED ORDER — OXYCODONE HCL 5 MG PO TABS
5.0000 mg | ORAL_TABLET | ORAL | Status: DC | PRN
  Administered 2016-11-10: 5 mg via ORAL
  Administered 2016-11-10 – 2016-11-12 (×6): 10 mg via ORAL
  Filled 2016-11-09 (×2): qty 2
  Filled 2016-11-09: qty 1
  Filled 2016-11-09 (×4): qty 2

## 2016-11-09 MED ORDER — 0.9 % SODIUM CHLORIDE (POUR BTL) OPTIME
TOPICAL | Status: DC | PRN
  Administered 2016-11-09: 1000 mL

## 2016-11-09 MED ORDER — BACLOFEN 10 MG PO TABS: 10.0000 mg | ORAL_TABLET | Freq: Three times a day (TID) | ORAL | 0 refills | Status: DC

## 2016-11-09 MED ORDER — ACETAMINOPHEN 650 MG RE SUPP: 650.0000 mg | Freq: Four times a day (QID) | RECTAL | Status: DC | PRN

## 2016-11-09 MED ORDER — ACETAMINOPHEN 325 MG PO TABS: 650.0000 mg | ORAL_TABLET | Freq: Four times a day (QID) | ORAL | Status: DC | PRN

## 2016-11-09 MED ORDER — HYDROMORPHONE HCL 1 MG/ML IJ SOLN
0.5000 mg | INTRAMUSCULAR | Status: DC | PRN
  Administered 2016-11-10: 0.5 mg via INTRAVENOUS
  Filled 2016-11-09: qty 1

## 2016-11-09 MED ORDER — ALUM & MAG HYDROXIDE-SIMETH 200-200-20 MG/5ML PO SUSP: 30.0000 mL | ORAL | Status: DC | PRN

## 2016-11-09 MED ORDER — ALPRAZOLAM 0.25 MG PO TABS: 0.2500 mg | ORAL_TABLET | Freq: Every evening | ORAL | Status: DC | PRN

## 2016-11-09 MED ORDER — VITAMIN D 1000 UNITS PO TABS
1000.0000 [IU] | ORAL_TABLET | Freq: Two times a day (BID) | ORAL | Status: DC
  Administered 2016-11-09 – 2016-11-12 (×6): 1000 [IU] via ORAL
  Filled 2016-11-09 (×6): qty 1

## 2016-11-09 MED ORDER — OXYCODONE HCL 5 MG PO TABS: 5.0000 mg | ORAL_TABLET | Freq: Once | ORAL | Status: DC | PRN

## 2016-11-09 MED ORDER — FENTANYL CITRATE (PF) 100 MCG/2ML IJ SOLN: 25.0000 ug | INTRAMUSCULAR | Status: DC | PRN

## 2016-11-09 MED ORDER — PANTOPRAZOLE SODIUM 40 MG PO TBEC
80.0000 mg | DELAYED_RELEASE_TABLET | Freq: Every day | ORAL | Status: DC
  Administered 2016-11-10 – 2016-11-12 (×3): 80 mg via ORAL
  Filled 2016-11-09 (×3): qty 2

## 2016-11-09 SURGICAL SUPPLY — 53 items
BIT DRILL QUICK REL 1/8 2PK SL (DRILL) IMPLANT
BIT DRILL TWIST 2.7 (BIT) IMPLANT
BLADE SAW SGTL MED 73X18.5 STR (BLADE) ×2 IMPLANT
CAPT SHLDR REVTOTAL 2 ×1 IMPLANT
CLSR STERI-STRIP ANTIMIC 1/2X4 (GAUZE/BANDAGES/DRESSINGS) ×2 IMPLANT
COVER SURGICAL LIGHT HANDLE (MISCELLANEOUS) ×2 IMPLANT
DRAPE ORTHO SPLIT 77X108 STRL (DRAPES) ×4
DRAPE PROXIMA HALF (DRAPES) ×2 IMPLANT
DRAPE SURG ORHT 6 SPLT 77X108 (DRAPES) ×2 IMPLANT
DRAPE U-SHAPE 47X51 STRL (DRAPES) ×2 IMPLANT
DRILL QUICK RELEASE 1/8 INCH (DRILL)
DRSG MEPILEX BORDER 4X8 (GAUZE/BANDAGES/DRESSINGS) ×2 IMPLANT
DURAPREP 26ML APPLICATOR (WOUND CARE) ×3 IMPLANT
ELECT CAUTERY BLADE 6.4 (BLADE) ×1 IMPLANT
ELECT REM PT RETURN 9FT ADLT (ELECTROSURGICAL) ×2
ELECTRODE REM PT RTRN 9FT ADLT (ELECTROSURGICAL) ×1 IMPLANT
GLOVE BIOGEL PI ORTHO PRO SZ8 (GLOVE) ×2
GLOVE ORTHO TXT STRL SZ7.5 (GLOVE) ×2 IMPLANT
GLOVE PI ORTHO PRO STRL SZ8 (GLOVE) ×2 IMPLANT
GLOVE SURG ORTHO 8.0 STRL STRW (GLOVE) ×2 IMPLANT
GOWN STRL REUS W/ TWL XL LVL3 (GOWN DISPOSABLE) ×1 IMPLANT
GOWN STRL REUS W/TWL 2XL LVL3 (GOWN DISPOSABLE) ×2 IMPLANT
GOWN STRL REUS W/TWL XL LVL3 (GOWN DISPOSABLE) ×2
HANDPIECE INTERPULSE COAX TIP (DISPOSABLE)
HOOD PEEL AWAY FACE SHEILD DIS (HOOD) ×4 IMPLANT
KIT BASIN OR (CUSTOM PROCEDURE TRAY) ×2 IMPLANT
KIT ROOM TURNOVER OR (KITS) ×2 IMPLANT
MANIFOLD NEPTUNE II (INSTRUMENTS) ×2 IMPLANT
NS IRRIG 1000ML POUR BTL (IV SOLUTION) ×2 IMPLANT
PACK SHOULDER (CUSTOM PROCEDURE TRAY) ×2 IMPLANT
PAD ARMBOARD 7.5X6 YLW CONV (MISCELLANEOUS) ×4 IMPLANT
PIN THREADED REVERSE (PIN) IMPLANT
SET HNDPC FAN SPRY TIP SCT (DISPOSABLE) ×1 IMPLANT
SLING ARM IMMOBILIZER LRG (SOFTGOODS) ×1 IMPLANT
SLING ARM IMMOBILIZER MED (SOFTGOODS) IMPLANT
SMARTMIX MINI TOWER (MISCELLANEOUS)
SPONGE LAP 18X18 X RAY DECT (DISPOSABLE) ×1 IMPLANT
SUCTION FRAZIER HANDLE 10FR (MISCELLANEOUS) ×1
SUCTION TUBE FRAZIER 10FR DISP (MISCELLANEOUS) ×1 IMPLANT
SUPPORT WRAP ARM LG (MISCELLANEOUS) ×2 IMPLANT
SUT FIBERWIRE #2 38 REV NDL BL (SUTURE) ×8
SUT MNCRL AB 4-0 PS2 18 (SUTURE) IMPLANT
SUT VIC AB 0 CT1 27 (SUTURE) ×2
SUT VIC AB 0 CT1 27XBRD ANBCTR (SUTURE) ×1 IMPLANT
SUT VIC AB 2-0 CT1 27 (SUTURE) ×2
SUT VIC AB 2-0 CT1 TAPERPNT 27 (SUTURE) ×1 IMPLANT
SUT VIC AB 3-0 SH 8-18 (SUTURE) ×2 IMPLANT
SUTURE FIBERWR#2 38 REV NDL BL (SUTURE) ×6 IMPLANT
TOWEL OR 17X24 6PK STRL BLUE (TOWEL DISPOSABLE) ×2 IMPLANT
TOWEL OR 17X26 10 PK STRL BLUE (TOWEL DISPOSABLE) ×2 IMPLANT
TOWER SMARTMIX MINI (MISCELLANEOUS) IMPLANT
TUBE CONNECTING 12X1/4 (SUCTIONS) IMPLANT
YANKAUER SUCT BULB TIP NO VENT (SUCTIONS) ×2 IMPLANT

## 2016-11-09 NOTE — Transfer of Care (Signed)
Immediate Anesthesia Transfer of Care Note  Patient: Kathy [name]  Procedure(s) Performed: Procedure(s): TOTAL REVERSE SHOULDER ARTHROPLASTY (Left)  Patient Location: PACU  Anesthesia Type:GA combined with regional for post-op pain  Level of Consciousness: awake, alert  and oriented  Airway & Oxygen Therapy: Patient Spontanous Breathing and Patient connected to nasal cannula oxygen  Post-op Assessment: Report given to RN and Post -op Vital signs reviewed and stable  Post vital signs: Reviewed and stable  Last Vitals:  Vitals:   11/09/16 0955 11/09/16 1005  BP:  (!) 100/50  Pulse: 91 66  Resp: (!) 21 15  Temp:      Last Pain:  Vitals:   11/09/16 0848  TempSrc: Oral         Complications: No apparent anesthesia complications

## 2016-11-09 NOTE — H&P (Signed)
PREOPERATIVE H&P  Chief Complaint: rotator cuff arthropathy left shoulder  HPI: Kathy [name] is a 76 y.o. female who presents for preoperative history and physical with a diagnosis of rotator cuff arthropathy left shoulder. Symptoms are rated as moderate to severe, and have been worsening.  This is significantly impairing activities of daily living.  She has elected for surgical management.   She had a contralateral reverse and has done well.   She has failed injections, activity modification, anti-inflammatories, and assistive devices.  Preoperative X-rays demonstrate end stage degenerative changes with osteophyte formation, loss of joint space, subchondral sclerosis.   Past Medical History:  Diagnosis Date  . Anemia    FROM BLEEDING ULCER  . Anxiety    takes Alprazolam daily as needed  . Arthritis    dx with RA 2017  . Diverticulosis   . GERD (gastroesophageal reflux disease)   . Headache(784.0)    occasionally  . History of blood transfusion    no abnormal reaction noted  . History of bronchitis    last time many yrs ago  . Hyperlipidemia    PT DENIES THIS DX -  ON NO MEDS AND NO ONE HAS TOLD HER  . Hypertension    takes Amlodipine daily  . Hypothyroidism    takes Synthroid daily  . Insomnia   . Joint pain   . Nocturia   . Numbness    occasionally left arm at night  . Osteoporosis    takes Fosamax weekly  . Peripheral edema    takes Lasix daily as needed  . PONV (postoperative nausea and vomiting)   . 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 Stomach ulcer    Past Surgical History:  Procedure Laterality Date  . ABDOMINAL HYSTERECTOMY    . cataract surgery Bilateral   . CHOLECYSTECTOMY    . COLONOSCOPY    . EYE SURGERY     CATARACTS BOTH  . gastric bypass surgery    . JOINT REPLACEMENT Bilateral    hip  . REVERSE SHOULDER ARTHROPLASTY Right 05/11/2016   Procedure: REVERSE SHOULDER ARTHROPLASTY;  Surgeon: Marchia Bond, MD;  Location: [employer];   Service: Orthopedics;  Laterality: Right;  . REVISION TOTAL HIP ARTHROPLASTY Left 10/03/2013   DR Mayer Camel  . TOTAL HIP REVISION Left 10/03/2013   Procedure: TOTAL HIP REVISION- left;  Surgeon: Kerin Salen, MD;  Location: [place];  Service: Orthopedics;  Laterality: Left;  . TOTAL THYROIDECTOMY     Social History   Social History  . Marital status: Divorced    Spouse name: N/A  . Number of children: N/A  . Years of education: N/A   Social History Main Topics  . Smoking status: Never Smoker  . Smokeless tobacco: Never Used  . Alcohol use No  . Drug use: No  . Sexual activity: Not Asked   Other Topics Concern  . None   Social History Narrative  . None   History reviewed. No pertinent family history. Allergies  Allergen Reactions  . Aspirin Other (See Comments)    Adult Aspirin: upset stomach / irritates ulcer    Prior to Admission medications   Medication Sig Start Date End Date Taking? Authorizing Provider  acetaminophen (TYLENOL) 325 MG tablet Take 650 mg by mouth every 6 (six) hours as needed.   Yes Historical Provider, MD  alendronate (FOSAMAX) 70 MG tablet Take 70 mg by mouth every Sunday. Take with a full glass of water on an empty stomach.   Yes  Historical Provider, MD  ALPRAZolam Duanne Moron) 0.25 MG tablet Take 0.25 mg by mouth at bedtime as needed for anxiety or sleep.   Yes Historical Provider, MD  amLODipine (NORVASC) 5 MG tablet Take 5 mg by mouth daily as needed (when blood pressure gets high).    Yes Historical Provider, MD  aspirin EC 81 MG tablet Take 81 mg by mouth daily.   Yes Historical Provider, MD  calcium carbonate 1250 MG capsule Take 300-600 mg by mouth See admin instructions. 600 mg in the morning, 300 mg in the afternoon, and 600 mg at bedtime   Yes Historical Provider, MD  Cholecalciferol 1000 UNITS TBDP Take 1,000 Units by mouth 2 (two) times daily.   Yes Historical Provider, MD  diclofenac sodium (VOLTAREN) 1 % GEL Apply 2 g topically 2 (two) times daily  as needed (for pain in knees and shoulder).   Yes Historical Provider, MD  furosemide (LASIX) 40 MG tablet Take 40 mg by mouth daily.    Yes Historical Provider, MD  hydroxychloroquine (PLAQUENIL) 200 MG tablet Take 200 mg by mouth 2 (two) times daily.   Yes Historical Provider, MD  Multiple Vitamin (MULTIVITAMIN WITH MINERALS) TABS tablet Take 1 tablet by mouth daily.    Yes Historical Provider, MD  Omega-3 Fatty Acids (FISH OIL) 1000 MG CAPS Take 1,000 mg by mouth daily.   Yes Historical Provider, MD  omeprazole (PRILOSEC) 40 MG capsule Take 40 mg by mouth daily.    Yes Historical Provider, MD  SYNTHROID 100 MCG tablet Take 100 mcg by mouth daily before breakfast.  03/08/16  Yes Historical Provider, MD  traMADol (ULTRAM) 50 MG tablet Take 50 mg by mouth every 6 (six) hours as needed for moderate pain.    Yes Historical Provider, MD  traZODone (DESYREL) 50 MG tablet Take 50 mg by mouth at bedtime.   Yes Historical Provider, MD     Positive ROS: All other systems have been reviewed and were otherwise negative with the exception of those mentioned in the HPI and as above.  Physical Exam: General: Alert, no acute distress Cardiovascular: No pedal edema Respiratory: No cyanosis, no use of accessory musculature GI: No organomegaly, abdomen is soft and non-tender Skin: No lesions in the area of chief complaint Neurologic: Sensation intact distally Psychiatric: Patient is competent for consent with normal mood and affect Lymphatic: No axillary or cervical lymphadenopathy  MUSCULOSKELETAL: left shoulder AROM 0-40, ER to 10, profound weakness.  Assessment: rotator cuff arthropathy left shoulder   Plan: Plan for Procedure(s): REVERSE TOTAL SHOULDER ARTHROPLASTY  The risks benefits and alternatives were discussed with the patient including but not limited to the risks of nonoperative treatment, versus surgical intervention including infection, bleeding, nerve injury,  blood clots,  cardiopulmonary complications, morbidity, mortality, among others, and they were willing to proceed.   [name], MD Cell [phone]   11/09/2016 9:57 AM

## 2016-11-09 NOTE — Op Note (Signed)
11/09/2016  1:06 PM  PATIENT:  Kathy [name]    PRE-OPERATIVE DIAGNOSIS:  Left rotator cuff arthropathy  POST-OPERATIVE DIAGNOSIS:  Same  PROCEDURE:  TOTAL REVERSE SHOULDER ARTHROPLASTY  SURGEON:  [name], MD  PHYSICIAN ASSISTANT: Joya Gaskins, OPA-C, present and scrubbed throughout the case, critical for completion in a timely fashion, and for retraction, instrumentation, and closure.  ANESTHESIA:   General with interscalene block  ESTIMATED BLOOD LOSS: 150 mL  PREOPERATIVE INDICATIONS:  Kathy [name] is a  76 y.o. female with a diagnosis of left rotator cuff arthropathy who failed conservative measures and elected for surgical management.  She's had the contralateral side done with good results and wished for the same operation.  The risks benefits and alternatives were discussed with the patient preoperatively including but not limited to the risks of infection, bleeding, nerve injury, cardiopulmonary complications, the need for revision surgery, dislocation, brachial plexus palsy, incomplete relief of pain, among others, and the patient was willing to proceed.  OPERATIVE IMPLANTS: Biomet size 10 humeral stem press-fit micro with a 44 mm reverse shoulder arthroplasty tray with a standard liner and a 36 mm glenosphere with a mini baseplate and 4 locking screws and one central nonlocking screw.  OPERATIVE FINDINGS: Advanced rotator cuff arthropathy with extremely sclerotic bone. There was no supraspinous tendon work within the subscapularis was in extremely poor condition. The biceps tendon was not present.  Unique aspects of the case: There was no biceps to tenodesis.  During placement of the glenoid, after reaming, the bone within the vault was extremely sclerotic, not really cancellus at all, and in fact I could not seat the implant initially. I had to remove the implant halfway down, because I did not feel that it was going to seat fully, and I was getting too much  interference fit, and so I re-reamed with a slight wobble in order to allow for seating. The tolerance between her vault which was effectively cortical, I suspect from the Fosamax use, and the extra scratch fit on the implant made it extremely difficult to see. I had a extremely strong press-fit. The central screw however did not have a great bite, I had excellent purchase in the superior locking screw which was a 25 that went into the base of the coracoid. The remaining screws were all 15 mm, and fairly short, as there was a fairly substantial amount of wear on the glenoid.  During placement of the humeral stem, the 11 reamer was initially utilized, but she only broach to a size 10. During the trialing process, reduction was fairly challenging with the standard liner, however it didn't reduce, although dislocation was extremely difficult and I had to use the "shoehorn". Some of this may have been from the soft tissue sleeve from the subscapularis peel, which still had an inferior attachment to the humerus, after all the implants were in there was very minimal shuck, although the conjoined tendon did not have substantial tension. I was concerned that I could be overtightening her, however I placed a standard head cut, and used a standard insert, and so I felt that removal of more bone proximally would have potentially reduced her metaphyseal stability and press fit, so I continued with the standard head cut and standard size. The reduction with the final implants felt appropriate.  OPERATIVE PROCEDURE: The patient was brought to the operating room and placed in the supine position. General anesthesia was administered. IV antibiotics were given. Time out was performed. The upper  extremity was prepped and draped in usual sterile fashion. The patient was in a beachchair position. Deltopectoral approach was carried out. The biceps was not present.   I then performed circumferential releases of the humerus, and  then dislocated the head, and then reamed with the reamer to the above named size.  I then applied the jig, and cut the humeral head in 30 of retroversion, and then turned my attention to the glenoid.  Deep retractors were placed, and I resected the labrum, and then placed a guidepin into the center position on the glenoid, with slight inferior inclination. I then reamed over the guidepin, and this created a small metaphyseal cancellus blush inferiorly, removing just the cartilage to the subchondral bone superiorly. This bone was extremely sclerotic. As indicated above, I attempted placement of the baseplate, removed it to re-ream in order to allow for full seating, because the tolerance was not loose enough to allow the baseplate to fully seat before I did the second reaming.   The base plate was selected and impacted place, and then I secured it centrally with a nonlocking screw, which did not have the greatest bite.  I positioned the baseplate with an anterior superior screw, and order to gain fixation at the coracoid base, and this had excellent fixation with a 25 mm screw. The rest of the screws were size 15, effectively at the periphery of the shallow vault.  I then turned my attention to the glenosphere, and impacted this into place, placing slight inferior offset (set on B).   The glenoid sphere was completely seated, and had engagement of the [facility] taper. I then turned my attention back to the humerus.  I sequentially broached, and then trialed, and was found to restore soft tissue tension, and it had slightly more than 2 finger tightness. Therefore the above named components were selected. The shoulder felt stable throughout functional motion.  Before I placed the real prosthesis I had also placed a total of 3 #2 FiberWire through drill holes in the humerus for later subscapularis repair.  I then impacted the real prosthesis into place, as well as the real humeral tray, and reduced the  shoulder. The shoulder had excellent motion, and was stable, and I irrigated the wounds copiously.    I then used these to repair the subscapularis. This came down to bone.  I then irrigated the shoulder copiously once more, repaired the deltopectoral interval with Vicryl followed by subcutaneous Vicryl with Steri-Strips and sterile gauze for the skin. The patient was awakened and returned back in stable and satisfactory condition. There no complications and She tolerated the procedure well.

## 2016-11-09 NOTE — Progress Notes (Signed)
Received to 6n17 from PACU, denies pain/nausea. Family at bedside, oriented to room and surroundings

## 2016-11-09 NOTE — Anesthesia Procedure Notes (Signed)
Procedure Name: Intubation Date/Time: 11/09/2016 10:50 AM Performed by: Candis Shine Pre-anesthesia Checklist: Patient identified, Emergency Drugs available, Suction available and Patient being monitored Patient Re-evaluated:Patient Re-evaluated prior to inductionOxygen Delivery Method: Circle System Utilized Preoxygenation: Pre-oxygenation with 100% oxygen Intubation Type: IV induction Ventilation: Mask ventilation without difficulty Laryngoscope Size: Mac and 3 Grade View: Grade I Tube type: Oral Tube size: 7.0 mm Number of attempts: 1 Airway Equipment and Method: Stylet Placement Confirmation: ETT inserted through vocal cords under direct vision,  positive ETCO2 and breath sounds checked- equal and bilateral Secured at: 21 cm Tube secured with: Tape Dental Injury: Teeth and Oropharynx as per pre-operative assessment

## 2016-11-10 ENCOUNTER — Encounter (HOSPITAL_COMMUNITY): Payer: Self-pay | Admitting: Orthopedic Surgery

## 2016-11-10 LAB — BASIC METABOLIC PANEL
Anion gap: 8 (ref 5–15)
BUN: 9 mg/dL (ref 6–20)
CO2: 24 mmol/L (ref 22–32)
Calcium: 8.8 mg/dL — ABNORMAL LOW (ref 8.9–10.3)
Chloride: 106 mmol/L (ref 101–111)
Creatinine, Ser: 0.59 mg/dL (ref 0.44–1.00)
GFR calc Af Amer: >60 mL/min (ref >60)
GFR calc non Af Amer: >60 mL/min (ref >60)
Glucose, Bld: 100 mg/dL — ABNORMAL HIGH (ref 65–99)
Potassium: 4.1 mmol/L (ref 3.5–5.1)
Sodium: 138 mmol/L (ref 135–145)

## 2016-11-10 LAB — CBC
HCT: 27.8 % — ABNORMAL LOW (ref 36.0–46.0)
Hemoglobin: 9.4 g/dL — ABNORMAL LOW (ref 12.0–15.0)
MCH: 31.5 pg (ref 26.0–34.0)
MCHC: 33.8 g/dL (ref 30.0–36.0)
MCV: 93.3 fL (ref 78.0–100.0)
Platelets: 234 10*3/uL (ref 150–400)
RBC: 2.98 MIL/uL — ABNORMAL LOW (ref 3.87–5.11)
RDW: 15.7 % — ABNORMAL HIGH (ref 11.5–15.5)
WBC: 5.4 10*3/uL (ref 4.0–10.5)

## 2016-11-10 MED ORDER — RIVAROXABAN 10 MG PO TABS
10.0000 mg | ORAL_TABLET | Freq: Every day | ORAL | Status: DC
  Administered 2016-11-10 – 2016-11-12 (×3): 10 mg via ORAL
  Filled 2016-11-10 (×3): qty 1

## 2016-11-10 MED ORDER — RIVAROXABAN 10 MG PO TABS: 10.0000 mg | ORAL_TABLET | Freq: Every day | ORAL | 0 refills | Status: DC

## 2016-11-10 NOTE — Care Management Note (Signed)
Case Management Note  Patient Details  Name: Kathy [name] MRN: [id number] Date of Birth: Dec 20, 1940  Subjective/Objective:                    Action/Plan:   Expected Discharge Date:  11/11/16               Expected Discharge Plan:  Skilled Nursing Facility  In-House Referral:  Clinical Social Work  Discharge planning Services     Post Acute Care Choice:    Choice offered to:     DME Arranged:    DME Agency:     HH Arranged:    [place] Agency:     Status of Service:  In process, will continue to follow  If discussed at Long Length of Stay Meetings, dates discussed:    Additional Comments:  Marilu Favre, RN 11/10/2016, 2:53 PM

## 2016-11-10 NOTE — Discharge Instructions (Addendum)
Diet: As you were doing prior to hospitalization   Shower:  May shower but keep the wounds dry, use an occlusive plastic wrap, NO SOAKING IN TUB.  If the bandage gets wet, change with a clean dry gauze.  If you have a splint on, leave the splint in place and keep the splint dry with a plastic bag.  Dressing:  You may change your dressing 3-5 days after surgery, unless you have a splint.  If you have a splint, then just leave the splint in place and we will change your bandages during your first follow-up appointment.    If you had hand or foot surgery, we will plan to remove your stitches in about 2 weeks in the office.  For all other surgeries, there are sticky tapes (steri-strips) on your wounds and all the stitches are absorbable.  Leave the steri-strips in place when changing your dressings, they will peel off with time, usually 2-3 weeks.  Activity:  Increase activity slowly as tolerated, but follow the weight bearing instructions below.  The rules on driving is that you can not be taking narcotics while you drive, and you must feel in control of the vehicle.    Weight Bearing:   Sling at all times.    To prevent constipation: you may use a stool softener such as -  Colace (over the counter) 100 mg by mouth twice a day  Drink plenty of fluids (prune juice may be helpful) and high fiber foods Miralax (over the counter) for constipation as needed.    Itching:  If you experience itching with your medications, try taking only a single pain pill, or even half a pain pill at a time.  You may take up to 10 pain pills per day, and you can also use benadryl over the counter for itching or also to help with sleep.   Precautions:  If you experience chest pain or shortness of breath - call 911 immediately for transfer to the hospital emergency department!!  If you develop a fever greater that 101 F, purulent drainage from wound, increased redness or drainage from wound, or calf pain -- Call the office  at [phone]                                                Follow- Up Appointment:  Please call for an appointment to be seen in 2 weeks [place] - [phone]     Information on my medicine - XARELTO (Rivaroxaban)  This medication education was reviewed with me or my healthcare representative as part of my discharge preparation.  The pharmacist that spoke with me during my hospital stay was:  Saundra Shelling, [facility]  Why was Xarelto prescribed for you? Xarelto was prescribed for you to reduce the risk of blood clots forming after orthopedic surgery. The medical term for these abnormal blood clots is venous thromboembolism (VTE).  What do you need to know about xarelto ? Take your Xarelto ONCE DAILY at the same time every day. You may take it either with or without food.  If you have difficulty swallowing the tablet whole, you may crush it and mix in applesauce just prior to taking your dose.  Take Xarelto exactly as prescribed by your doctor and DO NOT stop taking Xarelto without talking to the doctor who prescribed the medication.  Stopping without  other VTE prevention medication to take the place of Xarelto may increase your risk of developing a clot.  After discharge, you should have regular check-up appointments with your healthcare provider that is prescribing your Xarelto.    What do you do if you miss a dose? If you miss a dose, take it as soon as you remember on the same day then continue your regularly scheduled once daily regimen the next day. Do not take two doses of Xarelto on the same day.   Important Safety Information A possible side effect of Xarelto is bleeding. You should call your healthcare provider right away if you experience any of the following: ? Bleeding from an injury or your nose that does not stop. ? Unusual colored urine (red or dark brown) or unusual colored stools (red or black). ? Unusual bruising for unknown reasons. ? A serious fall  or if you hit your head (even if there is no bleeding).  Some medicines may interact with Xarelto and might increase your risk of bleeding while on Xarelto. To help avoid this, consult your healthcare provider or pharmacist prior to using any new prescription or non-prescription medications, including herbals, vitamins, non-steroidal anti-inflammatory drugs (NSAIDs) and supplements.  This website has more information on Xarelto: [email or web address]

## 2016-11-10 NOTE — Clinical Social Work Note (Addendum)
CSW met with pt bedside to address consult for SNF placement. Pt in agreement with SNF placement and preference is [facility]. Pt has been there twice for 2 other surgeries.  CSW assessment to follow. CSW following for d/c needs.   Oretha Ellis, [place], [place] Work [phone]

## 2016-11-10 NOTE — Evaluation (Signed)
Occupational Therapy Evaluation Patient Details Name: Kathy [name] MRN: [id number] DOB: [birth year] Today's Date: 11/10/2016    History of Present Illness Pt is a 76 yo female with L shoulder pain secondary to rotator cuff tear, s/p rotator cuff repair 11/09/16. PMH for HTN, 2017 R rotator cuff repair   Clinical Impression   PTA, pt lived alone and was independent. Currently, she requires Min A for ADLs and functional mobility. Provided pt with education on UB dressing, bathing, sling wear, exercises, and precautions. Pt verbalized and demonstrated understanding with Min A to adhere to precautions during ADLs. Pt would benefit with short stay at SNF prior to returning home due to limited support as well as to facilitate safe dc home and return to PLOF. Will continue to follow acutely to facilitate safe dc and progress towards goals.     Follow Up Recommendations  SNF;Supervision/Assistance - 24 hour    Equipment Recommendations  Other (comment) (Defer to next venue)    Recommendations for Other Services       Precautions / Restrictions Precautions Precautions: Shoulder Type of Shoulder Precautions: Conservative protocal Shoulder Interventions: Shoulder sling/immobilizer;Off for dressing/bathing/exercises Precaution Booklet Issued: Yes (comment) Precaution Comments: Reviewed precautions, excercises, and ADLs Required Braces or Orthoses: Sling Restrictions Weight Bearing Restrictions: Yes LUE Weight Bearing: Non weight bearing      Mobility Bed Mobility Overal bed mobility: Needs Assistance Bed Mobility: Supine to Sit     Supine to sit: Modified independent (Device/Increase time);HOB elevated     General bed mobility comments: Towards R side  Transfers Overall transfer level: Needs assistance Equipment used: 1 person hand held assist Transfers: Sit to/from Stand Sit to Stand: Min assist         General transfer comment: min assist to initiate lift off vc for push  off and steady at standing    Balance Overall balance assessment: Needs assistance Sitting-balance support: Feet supported;Single extremity supported Sitting balance-Leahy Scale: Fair Sitting balance - Comments: maintained seated position while performing dressing and shoulder education   Standing balance support: Single extremity supported;During functional activity Standing balance-Leahy Scale: Poor Standing balance comment: HHA with min A for steady                            ADL either performed or assessed with clinical judgement   ADL Overall ADL's : Needs assistance/impaired Eating/Feeding: Set up;Sitting   Grooming: Set up;Sitting   Upper Body Bathing: Minimal assistance;Sitting Upper Body Bathing Details (indicate cue type and reason): Educated on bathing stratgies to adhere to precautions Lower Body Bathing: Set up;Supervison/ safety;Sitting/lateral leans Lower Body Bathing Details (indicate cue type and reason): Pt cleaned peri area with set up at EOB Upper Body Dressing : Minimal assistance;Sitting;Adhering to UE precautions;Cueing for compensatory techniques;Cueing for sequencing Upper Body Dressing Details (indicate cue type and reason): Pt donned new gown with Min A and cues for one hand tech Lower Body Dressing: Sit to/from stand;Minimal assistance   Toilet Transfer: Min guard;Ambulation Toilet Transfer Details (indicate cue type and reason): Simulated to recliner         Functional mobility during ADLs: Minimal assistance General ADL Comments: Pt verbalized and demonstrated understanding of education, precautions, and techniques. Would benefit from short term stay at SNF to increase pt safety with ADLs     Vision         Perception     Praxis      Pertinent Vitals/Pain Pain Assessment: 0-10  Pain Score: 4  Pain Location: L shoulder Pain Descriptors / Indicators: Guarding;Grimacing;Constant;Aching Pain Intervention(s): Monitored during  session     Hand Dominance Right   Extremity/Trunk Assessment Upper Extremity Assessment Upper Extremity Assessment: Overall WFL for tasks assessed;LUE deficits/detail LUE Deficits / Details: L total shoulder. Good ROM in elbow, wrist, and hand LUE: Unable to fully assess due to immobilization   Lower Extremity Assessment Lower Extremity Assessment: Overall WFL for tasks assessed   Cervical / Trunk Assessment Cervical / Trunk Assessment: Normal   Communication Communication Communication: No difficulties   Cognition Arousal/Alertness: Awake/alert Behavior During Therapy: WFL for tasks assessed/performed Overall Cognitive Status: Within Functional Limits for tasks assessed                                     General Comments  Feel pt will progres well but would benefit from short SNF stay to increase safety and independence since pt lives alone    Exercises Exercises: Shoulder Shoulder Exercises Pendulum Exercise:  (NA) Elbow Flexion: AROM;Left;5 reps;Seated Elbow Extension: AROM;Left;5 reps;Seated Wrist Flexion: AROM;5 reps;Left;Seated Wrist Extension: AROM;Left;5 reps;Seated Digit Composite Flexion: AROM;Left;5 reps;Seated Composite Extension: AROM;Left;5 reps;Seated Neck Flexion: AROM;Seated Neck Extension: AROM;Seated Neck Lateral Flexion - Right: AROM;Seated Neck Lateral Flexion - Left: AROM;Seated Donning/doffing shirt without moving shoulder: Minimal assistance;Patient able to independently direct caregiver Method for sponge bathing under operated UE: Set-up;Supervision/safety Donning/doffing sling/immobilizer: Minimal assistance Correct positioning of sling/immobilizer: Minimal assistance Pendulum exercises (written home exercise program):  (NA) ROM for elbow, wrist and digits of operated UE: Supervision/safety Sling wearing schedule (on at all times/off for ADL's): Patient able to independently direct caregiver;Supervision/safety Proper  positioning of operated UE when showering: Supervision/safety;Set-up Positioning of UE while sleeping: Supervision/safety;Patient able to independently direct caregiver   Shoulder Instructions Shoulder Instructions Donning/doffing shirt without moving shoulder: Minimal assistance;Patient able to independently direct caregiver Method for sponge bathing under operated UE: Set-up;Supervision/safety Donning/doffing sling/immobilizer: Minimal assistance Correct positioning of sling/immobilizer: Minimal assistance Pendulum exercises (written home exercise program):  (NA) ROM for elbow, wrist and digits of operated UE: Supervision/safety Sling wearing schedule (on at all times/off for ADL's): Patient able to independently direct caregiver;Supervision/safety Proper positioning of operated UE when showering: Supervision/safety;Set-up Positioning of UE while sleeping: Supervision/safety;Patient able to independently direct caregiver    Home Living Family/patient expects to be discharged to:: Skilled nursing facility Living Arrangements: Alone                               Additional Comments: pt hoping to be DCed to [facility]      Prior Functioning/Environment Level of Independence: Independent        Comments: Pt performed ADLs and IADLs prior to admission. pt reports that she walks with cane or RW        OT Problem List: Decreased strength;Decreased range of motion;Decreased activity tolerance;Impaired balance (sitting and/or standing);Decreased knowledge of use of DME or AE;Decreased knowledge of precautions;Pain;Impaired UE functional use      OT Treatment/Interventions: Self-care/ADL training;Therapeutic exercise;Energy conservation;DME and/or AE instruction;Patient/family education;Therapeutic activities    OT Goals(Current goals can be found in the care plan section) Acute Rehab OT Goals Patient Stated Goal: To go to [facility] for a short stay then home OT Goal  Formulation: With patient Time For Goal Achievement: 11/24/16 Potential to Achieve Goals: Good ADL Goals Pt Will Perform Grooming: with  set-up;with supervision;standing Pt Will Perform Upper Body Bathing: with min guard assist;sitting Pt Will Perform Upper Body Dressing: with min guard assist;sitting Additional ADL Goal #1: Pt will independently  verbalize 3/3 UE exercises  OT Frequency: Min 2X/week   Barriers to D/C: Decreased caregiver support  Pt lives alone       Co-evaluation              End of Session Equipment Utilized During Treatment: Gait belt;Rolling walker;Other (comment) (Shoulder sling) Nurse Communication: Mobility status;Patient requests pain meds  Activity Tolerance: Patient tolerated treatment well Patient left: in chair;with call bell/phone within reach  OT Visit Diagnosis: Unsteadiness on feet (R26.81);Muscle weakness (generalized) (M62.81);Pain Pain - Right/Left: Left Pain - part of body: Shoulder                Time: 5456-2563 OT Time Calculation (min): 37 min Charges:  OT General Charges $OT Visit: 1 Procedure OT Evaluation $OT Eval Moderate Complexity: 1 Procedure OT Treatments $Self Care/Home Management : 8-22 mins G-Codes:     [employer], OTR/L [place] 11/10/2016, 3:23 PM

## 2016-11-10 NOTE — Progress Notes (Signed)
     Subjective:  Patient reports pain as moderate.  Doing well overall, worried about DVT given strong family history.   Objective:   VITALS:   Vitals:   11/09/16 2046 11/09/16 2155 11/10/16 0328 11/10/16 0621  BP: (!) 88/50 (!) 110/58 96/60 (!) 101/57  Pulse: 81  81 78  Resp: 19  19 18   Temp: 98.1 F (36.7 C)  98.2 F (36.8 C) 98.2 F (36.8 C)  TempSrc: Oral  Oral Oral  SpO2: 99%  99% 98%  Weight:      Height:        Neurologically intact Dorsiflexion/Plantar flexion intact Incision: no drainage   Lab Results  Component Value Date   WBC 5.4 11/10/2016   HGB 9.4 (L) 11/10/2016   HCT 27.8 (L) 11/10/2016   MCV 93.3 11/10/2016   PLT 234 11/10/2016   BMET    Component Value Date/Time   NA 138 11/10/2016 0712   NA 143 05/17/2016   K 4.1 11/10/2016 0712   CL 106 11/10/2016 0712   CO2 24 11/10/2016 0712   GLUCOSE 100 (H) 11/10/2016 0712   BUN 9 11/10/2016 0712   BUN 24 (A) 05/17/2016   CREATININE 0.59 11/10/2016 0712   CALCIUM 8.8 (L) 11/10/2016 0712   GFRNONAA >60 11/10/2016 0712   GFRAA >60 11/10/2016 3005     Assessment/Plan: 1 Day Post-Op   Principal Problem:   Left rotator cuff tear arthropathy Active Problems:   S/P shoulder replacement   Advance diet Up with therapy Discharge to SNF Wants to go to [facility], and says she has already discussed with her insurance.  Plan for dc tomorrow if ok for snf.  Will add xarelto given ulcer with asa, and strong family history of dvt.  Discussed risks of bleeding vs. Clot with patient.   Tymesha Ditmore P 11/10/2016, 8:18 AM   Marchia Bond, MD Cell [phone]

## 2016-11-10 NOTE — Clinical Social Work Placement (Signed)
   CLINICAL SOCIAL WORK PLACEMENT  NOTE  Date:  11/10/2016  Patient Details  Name: Alyce Inscore [place] MRN: [id number] Date of Birth: [birth year]  Clinical Social Work is seeking post-discharge placement for this patient at the [place] level of care (*CSW will initial, date and re-position this form in  chart as items are completed):  Yes   Patient/family provided with [facility] Work Department's list of facilities offering this level of care within the geographic area requested by the patient (or if unable, by the patient's family).  Yes   Patient/family informed of their freedom to choose among providers that offer the needed level of care, that participate in Medicare, Medicaid or managed care program needed by the patient, have an available bed and are willing to accept the patient.  Yes   Patient/family informed of [facility]'s ownership interest in [facility] and [facility], as well as of the fact that they are under no obligation to receive care at these facilities.  PASRR submitted to EDS on       PASRR number received on       Existing PASRR number confirmed on 11/10/16     FL2 transmitted to all facilities in geographic area requested by pt/family on 11/10/16     FL2 transmitted to all facilities within larger geographic area on       Patient informed that his/her managed care company has contracts with or will negotiate with certain facilities, including the following:            Patient/family informed of bed offers received.  Patient chooses bed at       Physician recommends and patient chooses bed at      Patient to be transferred to   on  .  Patient to be transferred to facility by Car     Patient family notified on   of transfer.  Name of family member notified:        PHYSICIAN       Additional Comment:    _______________________________________________ Truitt Merle, LCSW 11/10/2016, 4:18 PM

## 2016-11-10 NOTE — Evaluation (Signed)
Physical Therapy Evaluation Patient Details Name: Kathy [name] MRN: [id number] DOB: September 03, 1940 Today's Date: 11/10/2016   History of Present Illness  Pt is a 76 yo female with L shoulder pain secondary to rotator cuff tear, s/p rotator cuff repair 11/09/16. PMH for HTN, 2017 R rotator cuff repair  Clinical Impression  Patient is s/p above surgery resulting in functional limitations due to the deficits listed below (see PT Problem List).Pt is minA for transfers and ambulation of 20 feet with HHA. PT will try cane at next session.  Patient will benefit from skilled PT to increase their independence and safety with mobility to allow discharge to the venue listed below.      Follow Up Recommendations SNF;Supervision/Assistance - 24 hour    Equipment Recommendations  None recommended by PT (pt has RW and cane)    Recommendations for Other Services OT consult     Precautions / Restrictions Precautions Precautions: Shoulder Restrictions Weight Bearing Restrictions: Yes LUE Weight Bearing: Non weight bearing      Mobility  Bed Mobility               General bed mobility comments: EoB with OT at entry  Transfers Overall transfer level: Needs assistance Equipment used: 1 person hand held assist Transfers: Sit to/from Stand Sit to Stand: Min assist         General transfer comment: min assist to initiate lift off vc for push off and steady at standing  Ambulation/Gait Ambulation/Gait assistance: Min assist Ambulation Distance (Feet): 20 Feet Assistive device: 1 person hand held assist Gait Pattern/deviations: Step-through pattern;Decreased step length - right;Decreased step length - left;Shuffle;Narrow base of support Gait velocity: slowed Gait velocity interpretation: Below normal speed for age/gender General Gait Details: slow shuffling gait with minor LoBs corrected with minA by PT, try cane at next session        Balance Overall balance assessment: Needs  assistance Sitting-balance support: Feet supported;Single extremity supported Sitting balance-Leahy Scale: Fair Sitting balance - Comments: able to lift up R arm momentarily for gait belt placement;   Standing balance support: Single extremity supported Standing balance-Leahy Scale: Poor Standing balance comment: PT HHA with min A for steady                              Pertinent Vitals/Pain Pain Assessment: 0-10 Pain Score: 4  Pain Location: L shoulder Pain Descriptors / Indicators: Guarding;Grimacing;Constant;Aching Pain Intervention(s): Limited activity within patient's tolerance;Monitored during session;Patient requesting pain meds-RN notified  VSS    Home Living Family/patient expects to be discharged to:: Skilled nursing facility Living Arrangements: Alone                    Prior Function Level of Independence: Independent with assistive device(s)         Comments: pt lives alone.  Takes SCAT or has friend to take her places.  pt uses RW or cane to walk.  Pt denies history of falls     Hand Dominance   Dominant Hand: Right    Extremity/Trunk Assessment   Upper Extremity Assessment Upper Extremity Assessment: Defer to OT evaluation    Lower Extremity Assessment Lower Extremity Assessment: Overall WFL for tasks assessed    Cervical / Trunk Assessment Cervical / Trunk Assessment: Normal  Communication   Communication: No difficulties  Cognition Arousal/Alertness: Awake/alert Behavior During Therapy: WFL for tasks assessed/performed Overall Cognitive Status: Within Functional Limits for tasks assessed  Assessment/Plan    PT Assessment Patient needs continued PT services  PT Problem List Decreased strength;Decreased activity tolerance;Decreased balance;Decreased mobility;Pain       PT Treatment Interventions DME instruction;Gait training;Functional mobility  training;Therapeutic activities;Therapeutic exercise;Patient/family education    PT Goals (Current goals can be found in the Care Plan section)  Acute Rehab PT Goals Patient Stated Goal: to get home PT Goal Formulation: With patient Time For Goal Achievement: 11/17/16 Potential to Achieve Goals: Good    Frequency Min 3X/week   Barriers to discharge Decreased caregiver support      Co-evaluation               End of Session Equipment Utilized During Treatment: Gait belt Activity Tolerance: Patient limited by fatigue Patient left: in chair;with call bell/phone within reach Nurse Communication: Mobility status;Patient requests pain meds PT Visit Diagnosis: Unsteadiness on feet (R26.81);Muscle weakness (generalized) (M62.81);Other abnormalities of gait and mobility (R26.89);Pain Pain - Right/Left: Left Pain - part of body: Shoulder    Time: 4715-9539 PT Time Calculation (min) (ACUTE ONLY): 15 min   Charges:   PT Evaluation $PT Eval Low Complexity: 1 Procedure     PT G Codes:        Adalind Weitz B. Migdalia Dk PT, DPT Acute Rehabilitation  [phone] Pager [phone]   [place] 11/10/2016, 10:06 AM

## 2016-11-10 NOTE — NC FL2 (Signed)
[place] MEDICAID FL2 LEVEL OF CARE SCREENING TOOL     IDENTIFICATION  Patient Name: Kathy [name] Birthdate: [birth year] Sex: female Admission Date (Current Location): 11/09/2016  [facility] and [place] Number:  Herbalist and Address:  [facility], [place] [address], [place], [place] [address]      Provider Number: [id number]  Attending Physician Name and Address:  Marchia Bond, MD  Relative Name and Phone Number:       Current Level of Care: Hospital Recommended Level of Care: [place] Prior Approval Number:    Date Approved/Denied:   PASRR Number: [id number]  Discharge Plan: SNF    Current Diagnoses: Patient Active Problem List   Diagnosis Date Noted  . 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 Allergic rhinitis 10/19/2013  . GERD (gastroesophageal reflux disease) 10/19/2013  . Anemia 10/19/2013  . Osteoporosis   . Hypertension   . Hypothyroid   . Hyperlipidemia   . Anxiety   . 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    Orientation RESPIRATION BLADDER Height & Weight     Self, Time, Situation, Place  Normal Continent Weight: 146 lb (66.2 kg) Height:  5\' 1"  (154.9 cm)  BEHAVIORAL SYMPTOMS/MOOD NEUROLOGICAL BOWEL NUTRITION STATUS      Continent Diet (Regular diet; thin fluids)  AMBULATORY STATUS COMMUNICATION OF NEEDS Skin   Limited Assist Verbally Normal                       Personal Care Assistance Level of Assistance  Bathing, Feeding, Dressing Bathing Assistance: Limited assistance Feeding assistance: Independent Dressing Assistance: Limited assistance     Functional Limitations Info  Sight, Hearing, Speech Sight Info: Adequate Hearing Info: Adequate Speech Info: Adequate    SPECIAL CARE FACTORS FREQUENCY  PT (By licensed PT), OT (By licensed OT)     PT Frequency: 3x OT Frequency: 2x             Contractures Contractures Info: Not present    Additional Factors Info  Code Status, Allergies Code Status Info: Full Allergies Info: Aspirin           Current Medications (11/10/2016):  This is the current hospital active medication list Current Facility-Administered Medications  Medication Dose Route Frequency Provider Last Rate Last Dose  . 0.45 % NaCl with KCl 20 mEq / L infusion   Intravenous Continuous Marchia Bond, MD 75 mL/hr at 11/09/16 1626    . acetaminophen (TYLENOL) tablet 650 mg  650 mg Oral Q6H PRN Marchia Bond, MD       Or  . acetaminophen (TYLENOL) suppository 650 mg  650 mg Rectal Q6H PRN Marchia Bond, MD      . ALPRAZolam Duanne Moron) tablet 0.25 mg  0.25 mg Oral QHS PRN Marchia Bond, MD      . alum & mag hydroxide-simeth (MAALOX/MYLANTA) 200-200-20 MG/5ML suspension 30 mL  30 mL Oral Q4H PRN Marchia Bond, MD      . amLODipine (NORVASC) tablet 5 mg  5 mg Oral Daily PRN Marchia Bond, MD      . aspirin EC tablet 81 mg  81 mg Oral Daily Marchia Bond, MD   81 mg at 11/10/16 0841  . bisacodyl (DULCOLAX) suppository 10 mg  10 mg Rectal Daily PRN Marchia Bond, MD      . calcium carbonate (OS-CAL - dosed in mg of elemental  calcium) tablet 1,250 mg  1,250 mg Oral QAC lunch Marchia Bond, MD   1,250 mg at 11/10/16 1304  . calcium carbonate (OS-CAL - dosed in mg of elemental calcium) tablet 2,500 mg  2,500 mg Oral BID AC & HS Marchia Bond, MD   2,500 mg at 11/10/16 0847  . cholecalciferol (VITAMIN D) tablet 1,000 Units  1,000 Units Oral BID Marchia Bond, MD   1,000 Units at 11/10/16 0840  . diphenhydrAMINE (BENADRYL) 12.5 MG/5ML elixir 12.5-25 mg  12.5-25 mg Oral Q4H PRN Marchia Bond, MD      . docusate sodium (COLACE) capsule 100 mg  100 mg Oral BID Marchia Bond, MD   100 mg at 11/10/16 0841  . furosemide (LASIX) tablet 40 mg  40 mg Oral Daily Marchia Bond, MD   40 mg at 11/10/16 0841  . HYDROmorphone (DILAUDID) injection 0.5 mg  0.5 mg Intravenous Q2H PRN Marchia Bond, MD   0.5 mg at 11/10/16 0615  . hydroxychloroquine (PLAQUENIL) tablet 200 mg  200 mg Oral BID Marchia Bond, MD   200 mg at 11/10/16 0842  . levothyroxine (SYNTHROID, LEVOTHROID) tablet 100 mcg  100 mcg Oral QAC breakfast Marchia Bond, MD   100 mcg at 11/10/16 0842  . magnesium citrate solution 1 Bottle  1 Bottle Oral Once PRN Marchia Bond, MD      . menthol-cetylpyridinium (CEPACOL) lozenge 3 mg  1 lozenge Oral PRN Marchia Bond, MD       Or  . phenol (CHLORASEPTIC) mouth spray 1 spray  1 spray Mouth/Throat PRN Marchia Bond, MD      . methocarbamol (ROBAXIN) tablet 500 mg  500 mg Oral Q6H PRN Marchia Bond, MD   500 mg at 11/10/16 0981   Or  . methocarbamol (ROBAXIN) 500 mg in dextrose 5 % 50 mL IVPB  500 mg Intravenous Q6H PRN Marchia Bond, MD      . metoCLOPramide (REGLAN) tablet 5-10 mg  5-10 mg Oral Q8H PRN Marchia Bond, MD       Or  . metoCLOPramide (REGLAN) injection 5-10 mg  5-10 mg Intravenous Q8H PRN Marchia Bond, MD      . multivitamin with minerals tablet 1 tablet  1 tablet Oral Daily Marchia Bond, MD   1 tablet at 11/10/16 0841  . ondansetron (ZOFRAN) tablet 4 mg  4 mg Oral Q6H PRN Marchia Bond, MD       Or  . ondansetron [facility]) injection 4 mg  4 mg Intravenous Q6H PRN Marchia Bond, MD      . oxyCODONE (Oxy IR/ROXICODONE) immediate release tablet 5-10 mg  5-10 mg Oral Q3H PRN Marchia Bond, MD   10 mg at 11/10/16 1525  . pantoprazole (PROTONIX) EC tablet 80 mg  80 mg Oral Daily Marchia Bond, MD   80 mg at 11/10/16 0841  . polyethylene glycol (MIRALAX / GLYCOLAX) packet 17 g  17 g Oral Daily PRN Marchia Bond, MD      . rivaroxaban Alveda Reasons) tablet 10 mg  10 mg Oral Daily Marchia Bond, MD   10 mg at 11/10/16 0847  . senna (SENOKOT) tablet 8.6 mg  1 tablet Oral BID Marchia Bond, MD   8.6 mg at 11/10/16 0842  . traMADol (ULTRAM) tablet 50 mg  50 mg Oral Q6H PRN Marchia Bond, MD      . traZODone (DESYREL) tablet 50 mg  50 mg Oral QHS Marchia Bond, MD   50 mg at  11/09/16 2128  . zolpidem (AMBIEN) tablet  5 mg  5 mg Oral QHS PRN Marchia Bond, MD         Discharge Medications: Please see discharge summary for a list of discharge medications.  Relevant Imaging Results:  Relevant Lab Results:   Additional Information SSN: [id number]  Truitt Merle, LCSW

## 2016-11-11 NOTE — Clinical Social Work Note (Addendum)
Clinical Social Work Assessment  Patient Details  Name: Kathy [name] MRN: [id number] Date of Birth: 11-Apr-1941  Date of referral:  11/11/16               Reason for consult:  Discharge Planning                Permission sought to share information with:  Family Supports Permission granted to share information::  Yes, Verbal Permission Granted  Name::        Agency::  [facility]  Relationship::     Contact Information:     Housing/Transportation Living arrangements for the past 2 months:  Single Family Home Source of Information:  Patient Patient Interpreter Needed:  None Criminal Activity/Legal Involvement Pertinent to Current Situation/Hospitalization:  No - Comment as needed Significant Relationships:  Friend, Siblings Lives with:  Self Do you feel safe going back to the place where you live?  Yes Need for family participation in patient care:  No (Coment)  Care giving concerns:  No caregiving concerns identified.    Social Worker assessment / plan:  CSW met with pt at bedside yesterday to address consult for SNF placement. CSW introduced self and explained role of Education officer, museum.Pt from home and lives by herslef. Pt has brothers but they both live in [place]. Pt has friends and church members locally that support her. P/T O/T rec SNF. Pt agreeable to SNF for STR and prefers [facility]. Pt has been there twice for previous surgeries and is please with the care they provide. CSW clarified that pt has [employer] as of 08/02/16. Pt will have a friend to transport. Pt did not want CSW to contact any family or friends, as she has done/will do this on her own. Pt very talkative and pleasant. CSW completed and faxed out FL-2.   CSW confirmed bed availability with Rollene Fare at [facility]. They can accept pt and started insurance auth. Admission to [facility] pending insurance auth. CSW will facilitate d/c when medically stable. CSW will continue following for d/c needs.     Employment status:   Retired Forensic scientist:  Other (Comment Required) Doctor, general practice) PT Recommendations:  [place] / Referral to community resources:  [place]  Patient/Family's Response to care:  Pt is pleased with the care she has received and with CSW support and guidance.   Patient/Family's Understanding of and Emotional Response to Diagnosis, Current Treatment, and Prognosis:  Pt is very aware of her diagnosis, prognosis, and current treatment plan. Pt looking forward to returning to [place] for [place], with the intent to return home when strength is regained.   Emotional Assessment Appearance:  Appears stated age Attitude/Demeanor/Rapport:  Other (Appropriate) Affect (typically observed):  Accepting, Adaptable, Pleasant Orientation:  Oriented to Self, Oriented to Place, Oriented to  Time, Oriented to Situation Alcohol / Substance use:  Other Psych involvement (Current and /or in the community):  No (Comment)  Discharge Needs  Concerns to be addressed:  Care Coordination Readmission within the last 30 days:  No Current discharge risk:  Dependent with Mobility Barriers to Discharge:  Continued Medical Work up   [employer], LCSW 11/11/2016, 10:34 AM

## 2016-11-11 NOTE — Anesthesia Postprocedure Evaluation (Addendum)
Anesthesia Post Note  Patient: Kathy [name]  Procedure(s) Performed: Procedure(s) (LRB): TOTAL REVERSE SHOULDER ARTHROPLASTY (Left)  Patient location during evaluation: Nursing Unit Anesthesia Type: General and Regional Level of consciousness: awake, oriented and awake and alert Pain management: pain level controlled Vital Signs Assessment: post-procedure vital signs reviewed and stable Respiratory status: spontaneous breathing, respiratory function stable and nonlabored ventilation Cardiovascular status: blood pressure returned to baseline Anesthetic complications: no       Last Vitals:  Vitals:   11/10/16 1511 11/10/16 2047  BP: 126/76 107/60  Pulse: 83 88  Resp: 18 18  Temp: 37.3 C 37.3 C    Last Pain:  Vitals:   11/10/16 2209  TempSrc:   PainSc: Asleep                 Chrishauna Mee COKER

## 2016-11-11 NOTE — Anesthesia Preprocedure Evaluation (Signed)
Anesthesia Evaluation  Patient identified by MRN, date of birth, ID band Patient awake    Reviewed: Allergy & Precautions, NPO status , Patient's Chart, lab work & pertinent test results  Airway Mallampati: II  TM Distance: >3 FB Neck ROM: Full    Dental  (+) Teeth Intact, Dental Advisory Given   Pulmonary    breath sounds clear to auscultation       Cardiovascular hypertension,  Rhythm:Regular Rate:Normal     Neuro/Psych    GI/Hepatic   Endo/Other    Renal/GU      Musculoskeletal   Abdominal   Peds  Hematology   Anesthesia Other Findings   Reproductive/Obstetrics                             Anesthesia Physical Anesthesia Plan  ASA: II  Anesthesia Plan: General and Regional   Post-op Pain Management:    Induction: Intravenous  Airway Management Planned: Oral ETT  Additional Equipment:   Intra-op Plan:   Post-operative Plan: Extubation in OR  Informed Consent: I have reviewed the patients History and Physical, chart, labs and discussed the procedure including the risks, benefits and alternatives for the proposed anesthesia with the patient or authorized representative who has indicated his/her understanding and acceptance.   Dental advisory given  Plan Discussed with: CRNA and Anesthesiologist  Anesthesia Plan Comments:         Anesthesia Quick Evaluation

## 2016-11-11 NOTE — Discharge Summary (Addendum)
Physician Discharge Summary  Patient ID: Kathy [name] MRN: [id number] DOB/AGE: [birth year] 75 y.o.  Admit date: 11/09/2016 Discharge date: 11/12/2016  Admission Diagnoses:  Left rotator cuff tear arthropathy  Discharge Diagnoses:  Principal Problem:   Left rotator cuff tear arthropathy Active Problems:   S/P shoulder replacement   Past Medical History:  Diagnosis Date  . Anemia    FROM BLEEDING ULCER  . Anxiety    takes Alprazolam daily as needed  . Arthritis    dx with RA 2017  . Diverticulosis   . GERD (gastroesophageal reflux disease)   . Headache(784.0)    occasionally  . History of blood transfusion    no abnormal reaction noted  . History of bronchitis    last time many yrs ago  . Hyperlipidemia    PT DENIES THIS DX -  ON NO MEDS AND NO ONE HAS TOLD HER  . Hypertension    takes Amlodipine daily  . Hypothyroidism    takes Synthroid daily  . Insomnia   . Joint pain   . 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 Nocturia   . Numbness    occasionally left arm at night  . Osteoporosis    takes Fosamax weekly  . Peripheral edema    takes Lasix daily as needed  . PONV (postoperative nausea and vomiting)   . 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 Stomach ulcer     Surgeries: Procedure(s): TOTAL REVERSE SHOULDER ARTHROPLASTY on 11/09/2016   Consultants (if any):   Discharged Condition: Improved  Hospital Course: Kathy [name] is an 76 y.o. female who was admitted 11/09/2016 with a diagnosis of Left rotator cuff tear arthropathy and went to the operating room on 11/09/2016 and underwent the above named procedures.    She was given perioperative antibiotics:  Anti-infectives    Start     Dose/Rate Route Frequency Ordered Stop   11/09/16 2200  hydroxychloroquine (PLAQUENIL) tablet 200 mg     200 mg Oral 2 times daily 11/09/16 1503     11/09/16 1700  ceFAZolin (ANCEF) IVPB 1 g/50 mL premix     1 g 100 mL/hr over 30 Minutes Intravenous Every 6  hours 11/09/16 1503 11/10/16 0521   11/09/16 1015  ceFAZolin (ANCEF) IVPB 2g/100 mL premix     2 g 200 mL/hr over 30 Minutes Intravenous To [facility] 11/08/16 0846 11/09/16 1055    .  She was given sequential compression devices, early ambulation, and xarelto given family history for DVT prophylaxis.  She benefited maximally from the hospital stay and there were no complications.  Discharge delayed waiting on insurance SNF authorization.    Recent vital signs:  Vitals:   11/11/16 2031 11/12/16 0426  BP: 106/63 (!) 108/59  Pulse: 86 80  Resp: 17 18  Temp: (!) 100.8 F (38.2 C) 98.8 F (37.1 C)    Recent laboratory studies:  Lab Results  Component Value Date   HGB 9.4 (L) 11/10/2016   HGB 11.3 (L) 10/29/2016   HGB 10.3 (A) 05/17/2016   Lab Results  Component Value Date   WBC 5.4 11/10/2016   PLT 234 11/10/2016   Lab Results  Component Value Date   INR 0.96 09/26/2013   Lab Results  Component Value Date   NA 138 11/10/2016   K 4.1 11/10/2016   CL 106 11/10/2016   CO2 24 11/10/2016   BUN 9 11/10/2016   CREATININE 0.59 11/10/2016   GLUCOSE 100 (H) 11/10/2016  Discharge Medications:   Allergies as of 11/12/2016      Reactions   Aspirin Other (See Comments)   Adult Aspirin: upset stomach / irritates ulcer       Medication List    TAKE these medications   acetaminophen 325 MG tablet Commonly known as:  TYLENOL Take 650 mg by mouth every 6 (six) hours as needed.   alendronate 70 MG tablet Commonly known as:  FOSAMAX Take 70 mg by mouth every Sunday. Take with a full glass of water on an empty stomach.   ALPRAZolam 0.25 MG tablet Commonly known as:  XANAX Take 0.25 mg by mouth at bedtime as needed for anxiety or sleep.   amLODipine 5 MG tablet Commonly known as:  NORVASC Take 5 mg by mouth daily as needed (when blood pressure gets high).   aspirin EC 81 MG tablet Take 81 mg by mouth daily.   baclofen 10 MG tablet Commonly known as:   LIORESAL Take 1 tablet (10 mg total) by mouth 3 (three) times daily. As needed for muscle spasm   calcium carbonate 1250 MG capsule Take 300-600 mg by mouth See admin instructions. 600 mg in the morning, 300 mg in the afternoon, and 600 mg at bedtime   Cholecalciferol 1000 units Tbdp Take 1,000 Units by mouth 2 (two) times daily.   diclofenac sodium 1 % Gel Commonly known as:  VOLTAREN Apply 2 g topically 2 (two) times daily as needed (for pain in knees and shoulder).   Fish Oil 1000 MG Caps Take 1,000 mg by mouth daily.   furosemide 40 MG tablet Commonly known as:  LASIX Take 40 mg by mouth daily.   hydroxychloroquine 200 MG tablet Commonly known as:  PLAQUENIL Take 200 mg by mouth 2 (two) times daily.   multivitamin with minerals Tabs tablet Take 1 tablet by mouth daily.   omeprazole 40 MG capsule Commonly known as:  PRILOSEC Take 40 mg by mouth daily.   ondansetron 4 MG tablet Commonly known as:  ZOFRAN Take 1 tablet (4 mg total) by mouth every 8 (eight) hours as needed for nausea or vomiting.   oxyCODONE 5 MG immediate release tablet Commonly known as:  ROXICODONE Take 1-2 tablets (5-10 mg total) by mouth every 4 (four) hours as needed for severe pain.   rivaroxaban 10 MG Tabs tablet Commonly known as:  XARELTO Take 1 tablet (10 mg total) by mouth daily.   sennosides-docusate sodium 8.6-50 MG tablet Commonly known as:  SENOKOT-S Take 2 tablets by mouth daily.   SYNTHROID 100 MCG tablet Generic drug:  levothyroxine Take 100 mcg by mouth daily before breakfast.   traMADol 50 MG tablet Commonly known as:  ULTRAM Take 50 mg by mouth every 6 (six) hours as needed for moderate pain.   traZODone 50 MG tablet Commonly known as:  DESYREL Take 50 mg by mouth at bedtime.       Diagnostic Studies: Dg Shoulder Left Port  Result Date: 11/09/2016 CLINICAL DATA:  Status post shoulder replacement. EXAM: LEFT SHOULDER - 1 VIEW COMPARISON:  01/20/2016 FINDINGS:  Reverse glenohumeral arthroplasty on the left. Expected soft tissue gas. Prosthesis is in unremarkable location. No evidence of periprosthetic fracture. IMPRESSION: No acute finding after reverse glenohumeral arthroplasty on the left. Electronically Signed   By: Monte Fantasia M.D.   On: 11/09/2016 14:29    Disposition: 03-Skilled [place], [place]. Schedule an appointment as soon as possible  for a visit in 2 weeks.   Specialty:  Orthopedic Surgery Contact information: [place] [id number] [phone]            Signed: [name] 11/12/2016, 11:22 AM

## 2016-11-11 NOTE — Progress Notes (Signed)
Patient ID: Kathy [name], female   DOB: [birth year], 76 y.o.   MRN: [id number]     Subjective:  Patient reports pain as mild.  Patient sitting in the chair and in no acute distress.  Patient reports that she is ready to go to SNF  Objective:   VITALS:   Vitals:   11/10/16 1511 11/10/16 2047 11/11/16 0514 11/11/16 1425  BP: 126/76 107/60 115/67 (!) 97/59  Pulse: 83 88 91 87  Resp: 18 18 19 16   Temp: 99.1 F (37.3 C) 99.2 F (37.3 C) 99.7 F (37.6 C) 98.2 F (36.8 C)  TempSrc: Oral Oral Oral Oral  SpO2: 100% 97% 97% 99%  Weight:      Height:        ABD soft Sensation intact distally Dorsiflexion/Plantar flexion intact Incision: dressing C/D/I and no drainage   Lab Results  Component Value Date   WBC 5.4 11/10/2016   HGB 9.4 (L) 11/10/2016   HCT 27.8 (L) 11/10/2016   MCV 93.3 11/10/2016   PLT 234 11/10/2016   BMET    Component Value Date/Time   NA 138 11/10/2016 0712   NA 143 05/17/2016   K 4.1 11/10/2016 0712   CL 106 11/10/2016 0712   CO2 24 11/10/2016 0712   GLUCOSE 100 (H) 11/10/2016 0712   BUN 9 11/10/2016 0712   BUN 24 (A) 05/17/2016   CREATININE 0.59 11/10/2016 0712   CALCIUM 8.8 (L) 11/10/2016 0712   GFRNONAA >60 11/10/2016 0712   GFRAA >60 11/10/2016 1700     Assessment/Plan: 2 Days Post-Op   Principal Problem:   Left rotator cuff tear arthropathy Active Problems:   S/P shoulder replacement   Advance diet Up with therapy Discharge to SNF Sling at all times NWB left upper ext Okay for elbow, wrist and hand motion Follow up with Dr Mardelle Matte as scheduled   Rande Brunt, Erlene Quan 11/11/2016, 3:54 PM   Marchia Bond, MD Cell [phone]

## 2016-11-12 DIAGNOSIS — R69 Illness, unspecified: Secondary | ICD-10-CM | POA: Diagnosis not present

## 2016-11-12 DIAGNOSIS — Z96649 Presence of unspecified artificial hip joint: Secondary | ICD-10-CM | POA: Diagnosis not present

## 2016-11-12 DIAGNOSIS — M12811 Other specific arthropathies, not elsewhere classified, right shoulder: Secondary | ICD-10-CM | POA: Diagnosis not present

## 2016-11-12 DIAGNOSIS — E039 Hypothyroidism, unspecified: Secondary | ICD-10-CM | POA: Diagnosis not present

## 2016-11-12 DIAGNOSIS — M069 Rheumatoid arthritis, unspecified: Secondary | ICD-10-CM | POA: Diagnosis not present

## 2016-11-12 DIAGNOSIS — Z96612 Presence of left artificial shoulder joint: Secondary | ICD-10-CM | POA: Diagnosis not present

## 2016-11-12 DIAGNOSIS — E785 Hyperlipidemia, unspecified: Secondary | ICD-10-CM | POA: Diagnosis not present

## 2016-11-12 DIAGNOSIS — M12812 Other specific arthropathies, not elsewhere classified, left shoulder: Secondary | ICD-10-CM | POA: Diagnosis not present

## 2016-11-12 DIAGNOSIS — E038 Other specified hypothyroidism: Secondary | ICD-10-CM | POA: Diagnosis not present

## 2016-11-12 DIAGNOSIS — D649 Anemia, unspecified: Secondary | ICD-10-CM | POA: Diagnosis not present

## 2016-11-12 DIAGNOSIS — M19012 Primary osteoarthritis, left shoulder: Secondary | ICD-10-CM | POA: Diagnosis not present

## 2016-11-12 DIAGNOSIS — I10 Essential (primary) hypertension: Secondary | ICD-10-CM | POA: Diagnosis not present

## 2016-11-12 DIAGNOSIS — K219 Gastro-esophageal reflux disease without esophagitis: Secondary | ICD-10-CM | POA: Diagnosis not present

## 2016-11-12 DIAGNOSIS — F419 Anxiety disorder, unspecified: Secondary | ICD-10-CM | POA: Diagnosis not present

## 2016-11-12 DIAGNOSIS — M810 Age-related osteoporosis without current pathological fracture: Secondary | ICD-10-CM | POA: Diagnosis not present

## 2016-11-12 NOTE — Progress Notes (Signed)
Occupational Therapy Treatment Patient Details Name: Kathy [name] MRN: [id number] DOB: 06-Feb-1941 Today's Date: 11/12/2016    History of present illness Pt is a 76 yo female with L shoulder pain secondary to rotator cuff tear, s/p rotator cuff repair 11/09/16. PMH for HTN, 2017 R rotator cuff repair   OT comments  Pt completed LUE exercises for the elbow, wrist, and hand with supervision.  Still needs mod assist for doffing and donning sling.  Noted strapping too long for adjustments at the elbow and hand.  Will try smaller medium sized sling to see if better fit.  Pt planning on discharging to SNF later today for further intensive rehab.   Follow Up Recommendations  Supervision/Assistance - 24 hour;SNF    Equipment Recommendations  Other (comment) (TBD next venue of care)       Precautions / Restrictions Precautions Precautions: Shoulder Type of Shoulder Precautions: Conservative protocal Shoulder Interventions: Shoulder sling/immobilizer;Off for dressing/bathing/exercises Required Braces or Orthoses: Sling Restrictions Weight Bearing Restrictions: Yes LUE Weight Bearing: Non weight bearing (sling to left shoulder/arm)       Mobility Bed Mobility               General bed mobility comments: Pt in recliner at entry  Transfers Overall transfer level: Needs assistance Equipment used: 1 person hand held assist Transfers: Sit to/from Stand Sit to Stand: Min guard         General transfer comment: Pt with slower sit to stand from the EOB with use of the RUE, but did not require physical assist from therapist.      Balance Overall balance assessment: Needs assistance Sitting-balance support: Feet supported;Single extremity supported Sitting balance-Leahy Scale: Fair Sitting balance - Comments: able to lift up R arm momentarily for gait belt placement;   Standing balance support: No upper extremity supported Standing balance-Leahy Scale: Fair Standing balance  comment: with single point cane                           ADL either performed or assessed with clinical judgement   ADL Overall ADL's : Needs assistance/impaired                 Upper Body Dressing : Moderate assistance Upper Body Dressing Details (indicate cue type and reason): mod assist for doffing and donning sling on the LUE.                   General ADL Comments: Pt completed 2 sets of 20 repetitions for elbow flexion/extension after removal of the sling with AROM -5 to 130 degrees flexion.  She also completed 1 set of 10 repetitions for wrist extension/flexion and gross digit flexion/extension     Vision       Perception     Praxis      Cognition Arousal/Alertness: Awake/alert Behavior During Therapy: WFL for tasks assessed/performed Overall Cognitive Status: Within Functional Limits for tasks assessed                                                     Pertinent Vitals/ Pain       Pain Assessment: No/denies pain Pain Score: 3  Pain Location: L shoulder Pain Descriptors / Indicators: Guarding;Grimacing;Constant;Aching Pain Intervention(s): Limited activity within patient's tolerance;Monitored during session  Frequency  Min 2X/week        Progress Toward Goals  OT Goals(current goals can now be found in the care plan section)  Progress towards OT goals: Progressing toward goals  Acute Rehab OT Goals Patient Stated Goal: to get home  Plan Discharge plan remains appropriate       End of Session Equipment Utilized During Treatment: Other (comment) (shoulder sling)  OT Visit Diagnosis: Muscle weakness (generalized) (M62.81);Unsteadiness on feet (R26.81) Pain - Right/Left: Left Pain - part of body: Shoulder   Activity Tolerance Patient tolerated treatment well   Patient Left in chair;with call bell/phone within reach   Nurse Communication Other (comment) (need for smaller sling)        Time:  2820-8138 OT Time Calculation (min): 23 min  Charges: OT General Charges $OT Visit: 1 Procedure OT Treatments $Self Care/Home Management : 8-22 mins $Therapeutic Exercise: 8-22 mins     Kensy Blizard OTR/L 11/12/2016, 3:30 PM

## 2016-11-12 NOTE — Progress Notes (Signed)
Patient ID: Kathy [name], female   DOB: 1941/03/29, 76 y.o.   MRN: [id number]     Subjective:  Patient reports pain as mild.  Patient reports improvement and is ready to go to SNF  Objective:   VITALS:   Vitals:   11/11/16 0514 11/11/16 1425 11/11/16 2031 11/12/16 0426  BP: 115/67 (!) 97/59 106/63 (!) 108/59  Pulse: 91 87 86 80  Resp: 19 16 17 18   Temp: 99.7 F (37.6 C) 98.2 F (36.8 C) (!) 100.8 F (38.2 C) 98.8 F (37.1 C)  TempSrc: Oral Oral Oral Oral  SpO2: 97% 99% 98% 97%  Weight:      Height:        ABD soft Sensation intact distally Dorsiflexion/Plantar flexion intact Incision: dressing C/D/I and no drainage   Lab Results  Component Value Date   WBC 5.4 11/10/2016   HGB 9.4 (L) 11/10/2016   HCT 27.8 (L) 11/10/2016   MCV 93.3 11/10/2016   PLT 234 11/10/2016   BMET    Component Value Date/Time   NA 138 11/10/2016 0712   NA 143 05/17/2016   K 4.1 11/10/2016 0712   CL 106 11/10/2016 0712   CO2 24 11/10/2016 0712   GLUCOSE 100 (H) 11/10/2016 0712   BUN 9 11/10/2016 0712   BUN 24 (A) 05/17/2016   CREATININE 0.59 11/10/2016 0712   CALCIUM 8.8 (L) 11/10/2016 0712   GFRNONAA >60 11/10/2016 0712   GFRAA >60 11/10/2016 8875     Assessment/Plan: 3 Days Post-Op   Principal Problem:   Left rotator cuff tear arthropathy Active Problems:   S/P shoulder replacement   Advance diet Up with therapy Discharge to SNF Sling at all times nwb left upper ext   DOUGLAS PARRY, BRANDON 11/12/2016, 9:14 AM  Agree with above.   Marchia Bond, MD Cell [phone]

## 2016-11-12 NOTE — Clinical Social Work Note (Signed)
Pt is ready for discharge today and will go to [facility]. Pt is aware and agreeable to discharge plan. CSW sent clinicals/DC summary to [facility] and communicated with Tammy (admissions) for phone number for report. Tammy agreed to LOG. LOG approved by Zack. Report number provided to RN. Transportation arranged with friend by pt. CSW is signing off as no further needs identified.  Oretha Ellis, Latanya Presser, [place] [phone]

## 2016-11-12 NOTE — Progress Notes (Signed)
Physical Therapy Treatment Patient Details Name: Kathy [name] MRN: [id number] DOB: 1940-11-30 Today's Date: 11/12/2016    History of Present Illness Pt is a 76 yo female with L shoulder pain secondary to rotator cuff tear, s/p rotator cuff repair 11/09/16. PMH for HTN, 2017 R rotator cuff repair    PT Comments    Pt agreeable to short walk while nursing sets up her sponge bath. Pt is minA for transfers, and min guard for ambulation of 20 feet with single point cane. Pt requires skilled PT to progress ambulation and improve LE strength and endurance to safely navigate her environment.     Follow Up Recommendations  SNF;Supervision/Assistance - 24 hour     Equipment Recommendations  None recommended by PT (pt has RW and cane)    Recommendations for Other Services OT consult     Precautions / Restrictions Precautions Precautions: Shoulder Restrictions Weight Bearing Restrictions: Yes LUE Weight Bearing: Non weight bearing    Mobility  Bed Mobility               General bed mobility comments: Pt in recliner at entry  Transfers Overall transfer level: Needs assistance Equipment used: 1 person hand held assist Transfers: Sit to/from Stand Sit to Stand: Min assist (1x from recliner 1x from toilet)         General transfer comment: min assist to initiate lift off and steady at standing  Ambulation/Gait Ambulation/Gait assistance: Min guard Ambulation Distance (Feet): 20 Feet Assistive device: Straight cane Gait Pattern/deviations: Step-through pattern;Decreased step length - right;Decreased step length - left;Shuffle;Narrow base of support Gait velocity: slowed Gait velocity interpretation: Below normal speed for age/gender General Gait Details: slow shuffling gait able to maintain balance with use of single point cane      Balance Overall balance assessment: Needs assistance Sitting-balance support: Feet supported;Single extremity supported Sitting  balance-Leahy Scale: Fair Sitting balance - Comments: able to lift up R arm momentarily for gait belt placement;   Standing balance support: Single extremity supported Standing balance-Leahy Scale: Fair Standing balance comment: with single point cane                            Cognition Arousal/Alertness: Awake/alert Behavior During Therapy: WFL for tasks assessed/performed Overall Cognitive Status: Within Functional Limits for tasks assessed                                               Pertinent Vitals/Pain Pain Assessment: 0-10 Pain Score: 3  Pain Location: L shoulder Pain Descriptors / Indicators: Guarding;Grimacing;Constant;Aching Pain Intervention(s): Limited activity within patient's tolerance;Monitored during session  VSS           PT Goals (current goals can now be found in the care plan section) Acute Rehab PT Goals Patient Stated Goal: to get home PT Goal Formulation: With patient Time For Goal Achievement: 11/17/16 Potential to Achieve Goals: Good Progress towards PT goals: Progressing toward goals    Frequency    Min 3X/week      PT Plan Current plan remains appropriate    Co-evaluation             End of Session Equipment Utilized During Treatment: Gait belt Activity Tolerance: Patient limited by fatigue Patient left: in chair;with call bell/phone within reach Nurse Communication: Mobility status;Patient requests pain meds PT  Visit Diagnosis: Unsteadiness on feet (R26.81);Muscle weakness (generalized) (M62.81);Other abnormalities of gait and mobility (R26.89);Pain Pain - Right/Left: Left Pain - part of body: Shoulder     Time: 1164-3539 PT Time Calculation (min) (ACUTE ONLY): 14 min  Charges:  $Gait Training: 8-22 mins                    G Codes:       Lucette Kratz B. Migdalia Dk PT, DPT Acute Rehabilitation  [phone] Pager [phone]    [place] 11/12/2016, 12:29 PM

## 2016-11-12 NOTE — Progress Notes (Signed)
Orthopedic Tech Progress Note Patient Details:  Kathy [name] 06/30/1941 [id number]  Ortho Devices Type of Ortho Device: Other (comment) Ortho Device/Splint Location: Adjusted Sling immobilizer to fit better.  Pt thumb did not need to fit in the thumb hold of sling.  pt was unable to downgrade from large to medium because of the size of pt Elbow.  At this time I advised pt to remove abdominal strap so that sling fits form comfortable.  (pt Left Arm/shoulder) Ortho Device/Splint Interventions: Adjustment   Kristopher Oppenheim 11/12/2016, 4:29 PM

## 2016-11-12 NOTE — Care Management Note (Signed)
Case Management Note  Patient Details  Name: Kathy [name] MRN: [id number] Date of Birth: [birth year]  Subjective/Objective:                    Action/Plan:   Expected Discharge Date:  11/11/16               Expected Discharge Plan:  Skilled Nursing Facility  In-House Referral:  Clinical Social Work  Discharge planning Services     Post Acute Care Choice:    Choice offered to:     DME Arranged:    DME Agency:     HH Arranged:    [place] Agency:     Status of Service:  Completed, signed off  If discussed at [employer], dates discussed:    Additional Comments:  Marilu Favre, RN 11/12/2016, 10:26 AM

## 2016-11-12 NOTE — Progress Notes (Signed)
Discharging patient to [facility]. Report called to [place], RN @ [phone]. Written discharge instructions given to patient, verbalized understanding of new medications, f/u appointments and after care. Occupational Therapy worked with patient before discharge and recommended a medium arm sling, which has been ordered for patient and sending to facility with. Patient is leaving unit with two friends via wheelchair.

## 2016-11-16 ENCOUNTER — Encounter: Payer: Self-pay | Admitting: Adult Health

## 2016-11-16 ENCOUNTER — Non-Acute Institutional Stay (SKILLED_NURSING_FACILITY): Payer: Medicare HMO | Admitting: Adult Health

## 2016-11-16 DIAGNOSIS — M810 Age-related osteoporosis without current pathological fracture: Secondary | ICD-10-CM

## 2016-11-16 DIAGNOSIS — M069 Rheumatoid arthritis, unspecified: Secondary | ICD-10-CM | POA: Diagnosis not present

## 2016-11-16 DIAGNOSIS — R69 Illness, unspecified: Secondary | ICD-10-CM | POA: Diagnosis not present

## 2016-11-16 DIAGNOSIS — F419 Anxiety disorder, unspecified: Secondary | ICD-10-CM

## 2016-11-16 DIAGNOSIS — M12811 Other specific arthropathies, not elsewhere classified, right shoulder: Secondary | ICD-10-CM

## 2016-11-16 DIAGNOSIS — Z96612 Presence of left artificial shoulder joint: Secondary | ICD-10-CM

## 2016-11-16 DIAGNOSIS — K219 Gastro-esophageal reflux disease without esophagitis: Secondary | ICD-10-CM

## 2016-11-16 DIAGNOSIS — E038 Other specified hypothyroidism: Secondary | ICD-10-CM | POA: Diagnosis not present

## 2016-11-16 DIAGNOSIS — I10 Essential (primary) hypertension: Secondary | ICD-10-CM | POA: Diagnosis not present

## 2016-11-16 NOTE — Progress Notes (Signed)
Location:   [facility] Room Number: 121 A Place of Service:  SNF (31)   CODE STATUS: Full Code  Allergies  Allergen Reactions  . Aspirin Other (See Comments)    Adult Aspirin: upset stomach / irritates ulcer     Chief Complaint  Patient presents with  . Hospitalization Follow-up    Hospital Follow up    HPI:  She has been hospitalized for a left shoulder replacement. She is here for short term rehab; her goal is to return back home in 20 days. She tells me that her pain is being adequately managed at this time. There are no nursing concerns at this time.    Past Medical History:  Diagnosis Date  . Anemia    FROM BLEEDING ULCER  . Anxiety    takes Alprazolam daily as needed  . Arthritis    dx with RA 2017  . Diverticulosis   . GERD (gastroesophageal reflux disease)   . Headache(784.0)    occasionally  . History of blood transfusion    no abnormal reaction noted  . History of bronchitis    last time many yrs ago  . Hyperlipidemia    PT DENIES THIS DX -  ON NO MEDS AND NO ONE HAS TOLD HER  . Hypertension    takes Amlodipine daily  . Hypothyroidism    takes Synthroid daily  . Insomnia   . Joint pain   . 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 Nocturia   . Numbness    occasionally left arm at night  . Osteoporosis    takes Fosamax weekly  . Peripheral edema    takes Lasix daily as needed  . PONV (postoperative nausea and vomiting)   . 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 Stomach ulcer     Past Surgical History:  Procedure Laterality Date  . ABDOMINAL HYSTERECTOMY    . cataract surgery Bilateral   . CHOLECYSTECTOMY    . COLONOSCOPY    . EYE SURGERY     CATARACTS BOTH  . gastric bypass surgery    . JOINT REPLACEMENT Bilateral    hip  . REVERSE SHOULDER ARTHROPLASTY Right 05/11/2016   Procedure: REVERSE SHOULDER ARTHROPLASTY;  Surgeon: Marchia Bond, MD;  Location: [facility];  Service: Orthopedics;  Laterality: Right;  .  REVISION TOTAL HIP ARTHROPLASTY Left 10/03/2013   DR Mayer Camel  . TOTAL HIP REVISION Left 10/03/2013   Procedure: TOTAL HIP REVISION- left;  Surgeon: Kerin Salen, MD;  Location: [facility];  Service: Orthopedics;  Laterality: Left;  . TOTAL SHOULDER ARTHROPLASTY Left 11/09/2016   Procedure: TOTAL REVERSE SHOULDER ARTHROPLASTY;  Surgeon: Marchia Bond, MD;  Location: [facility];  Service: Orthopedics;  Laterality: Left;  . TOTAL THYROIDECTOMY      Social History   Social History  . Marital status: Divorced    Spouse name: N/A  . Number of children: N/A  . Years of education: N/A   Occupational History  . Not on file.   Social History Main Topics  . Smoking status: Never Smoker  . Smokeless tobacco: Never Used  . Alcohol use No  . Drug use: No  . Sexual activity: Not on file   Other Topics Concern  . Not on file   Social History Narrative  . No narrative on file   History reviewed. No pertinent family history.    VITAL SIGNS BP 112/60   Pulse 80   Temp 97.9 F (36.6 C)   Resp  16   Ht 4\' 8"  (1.422 m)   Wt 152 lb 9.6 oz (69.2 kg)   SpO2 97%   BMI 34.21 kg/m   Patient's Medications  New Prescriptions   No medications on file  Previous Medications   ACETAMINOPHEN (TYLENOL) 325 MG TABLET    Take 650 mg by mouth every 6 (six) hours as needed.   ALENDRONATE (FOSAMAX) 70 MG TABLET    Take 70 mg by mouth every Sunday. Take with a full glass of water on an empty stomach.   ALPRAZOLAM (XANAX) 0.25 MG TABLET    Take 0.25 mg by mouth at bedtime as needed for anxiety or sleep.   AMLODIPINE (NORVASC) 5 MG TABLET    Take 5 mg by mouth daily as needed (when blood pressure gets high).    ASPIRIN EC 81 MG TABLET    Take 81 mg by mouth daily.   BACLOFEN (LIORESAL) 10 MG TABLET    Take 1 tablet (10 mg total) by mouth 3 (three) times daily. As needed for muscle spasm   CALCIUM CARBONATE 1250 MG CAPSULE    Take 300-600 mg by mouth See admin instructions. 600 mg in the morning, 300 mg in the  afternoon, and 600 mg at bedtime   CHOLECALCIFEROL 1000 UNITS TBDP    Take 1,000 Units by mouth 2 (two) times daily.   DICLOFENAC SODIUM (VOLTAREN) 1 % GEL    Apply 2 g topically 2 (two) times daily as needed (for pain in knees and shoulder).   FUROSEMIDE (LASIX) 40 MG TABLET    Take 40 mg by mouth daily.    HYDROXYCHLOROQUINE (PLAQUENIL) 200 MG TABLET    Take 200 mg by mouth 2 (two) times daily.   MULTIPLE VITAMIN (MULTIVITAMIN WITH MINERALS) TABS TABLET    Take 1 tablet by mouth daily.    OMEGA-3 FATTY ACIDS (FISH OIL) 1000 MG CAPS    Take 1,000 mg by mouth daily.   OMEPRAZOLE (PRILOSEC) 40 MG CAPSULE    Take 40 mg by mouth daily.    ONDANSETRON (ZOFRAN) 4 MG TABLET    Take 1 tablet (4 mg total) by mouth every 8 (eight) hours as needed for nausea or vomiting.   OXYCODONE (ROXICODONE) 5 MG IMMEDIATE RELEASE TABLET    Take 1-2 tablets (5-10 mg total) by mouth every 4 (four) hours as needed for severe pain.   RIVAROXABAN (XARELTO) 10 MG TABS TABLET    Take 1 tablet (10 mg total) by mouth daily.   SENNOSIDES-DOCUSATE SODIUM (SENOKOT-S) 8.6-50 MG TABLET    Take 2 tablets by mouth daily.   SYNTHROID 100 MCG TABLET    Take 100 mcg by mouth daily before breakfast.    TRAMADOL (ULTRAM) 50 MG TABLET    Take 50 mg by mouth every 6 (six) hours as needed for moderate pain.    TRAZODONE (DESYREL) 50 MG TABLET    Take 50 mg by mouth at bedtime.  Modified Medications   No medications on file  Discontinued Medications   No medications on file     SIGNIFICANT DIAGNOSTIC EXAMS  11-10-16: left shoulder x-ray: No acute finding after reverse glenohumeral arthroplasty on the left.   LABS REVIEWED:   11-10-16: wbc 5.4; hgb 9.4; hct 27.8; mcv 93.3; plt 234; glucose 100; bun 9; creat 0.59; k+ 4.1; na++ 138; ca 8.8     Review of Systems  Constitutional: Negative for malaise/fatigue.  Respiratory: Negative for cough and shortness of breath.   Cardiovascular: Negative for  chest pain, palpitations and leg  swelling.  Gastrointestinal: Negative for abdominal pain, constipation and heartburn.  Musculoskeletal: Negative for back pain, joint pain and myalgias.       Is status post left shoulder replacement   Skin: Negative.   Neurological: Negative for dizziness.  Psychiatric/Behavioral: The patient is not nervous/anxious.     Physical Exam  Constitutional: She is oriented to person, place, and time. No distress.  Eyes: Conjunctivae are normal.  Neck: Neck supple. No JVD present. No thyromegaly present.  Cardiovascular: Normal rate, regular rhythm and intact distal pulses.   Respiratory: Effort normal and breath sounds normal. No respiratory distress. She has no wheezes.  GI: Soft. Bowel sounds are normal. She exhibits no distension. There is no tenderness.  Musculoskeletal: She exhibits no edema.  Able to move all extremities  Left shoulder in sling   Lymphadenopathy:    She has no cervical adenopathy.  Neurological: She is alert and oriented to person, place, and time.  Skin: Skin is warm and dry. She is not diaphoretic.  Incision line without signs of infection   Psychiatric: She has a normal mood and affect.    ASSESSMENT/ PLAN:  1. Osteoporosis: will continue fosamax 70 mg weekly with calcium and vit d supplements  2. Hypertension: her norvasc is daily as needed; she does not take this on a daily basis  3. gerd: will continue prilosec 40 mg daily  4. RA: will continue plaquenil 200 mg twice daily  Has voltaren gel to knees and right shoulder 2 gm twice daily as needed   5. Lower extremity edema; will continue lasix 40 mg daily   6. Constipation: will continue senna s 2 tabs daily   7. Hypothyroidism: will continue synthroid 100 mcg daily   8. Left rotator cuff tear arthroplasty: is status post left shoulder replacement on 11-09-16. Will follow up with orthopedics in the next 2 weeks. Will continue ultram 50 mg every 6 hours as needed for pain; oxycodone 5 or 10 mg every 4  hours as needed; will continue xarelto 10 mg daily for 21 days.   9. Anxiety: will continue trazodone 50 mg nightly and xanax 0.25 mg nightly as needed    Time spent with patient  50  minutes >50% time spent counseling; reviewing medical record; tests; labs; and developing future plan of care   MD is aware of resident's narcotic use and is in agreement with current plan of care. We will attempt to wean resident as apropriate   Ok Edwards NP [facility]  Contact [phone] Monday through Friday 8am- 5pm  After hours call [phone]

## 2016-11-17 ENCOUNTER — Non-Acute Institutional Stay (SKILLED_NURSING_FACILITY): Payer: Medicare HMO | Admitting: Internal Medicine

## 2016-11-17 ENCOUNTER — Encounter: Payer: Self-pay | Admitting: Internal Medicine

## 2016-11-17 DIAGNOSIS — M069 Rheumatoid arthritis, unspecified: Secondary | ICD-10-CM | POA: Diagnosis not present

## 2016-11-17 DIAGNOSIS — I10 Essential (primary) hypertension: Secondary | ICD-10-CM | POA: Diagnosis not present

## 2016-11-17 DIAGNOSIS — Z96612 Presence of left artificial shoulder joint: Secondary | ICD-10-CM | POA: Diagnosis not present

## 2016-11-17 DIAGNOSIS — M810 Age-related osteoporosis without current pathological fracture: Secondary | ICD-10-CM | POA: Diagnosis not present

## 2016-11-17 NOTE — Progress Notes (Signed)
Provider:  Veleta Miners Location:   [facility] Room Number: 121/A Place of Service:  SNF (31)  PCP: Alonza Bogus, MD Patient Care Team: Sinda Du, MD as PCP - General (Internal Medicine)  Extended Emergency Contact Information Primary Emergency Contact: Pflug,Kathy [name] Address: [place] [address] Kathy [name] of [place] Phone: [phone] Mobile Phone: [phone] Relation: Brother Secondary Emergency Contact: Kathy [name] Address: [place] [address] Kathy [name] of [place] Phone: [phone] Mobile Phone: [phone] Relation: Brother  Code Status: Full Code Goals of Care: Advanced Directive information Advanced Directives 11/17/2016  Does Patient Have a Medical Advance Directive? Yes  Type of Advance Directive (No Data)  Does patient want to make changes to medical advance directive? No - Patient declined  Would patient like information on creating a medical advance directive? No - Patient declined  Pre-existing out of facility DNR order (yellow form or pink MOST form) -      Chief Complaint  Patient presents with  . New Admit To SNF    HPI: Patient is a 76 y.o. female seen today for admission to SNF for therapy after Left Shoulder Replacement.  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  She was admitted for elective surgery on her Left shoulder as she had failed conservative treatment and was in constant Pain. She underwent Left Shoulder Replacement on 11/09/16. Her Post op was non eventful and she was transferred to Facility for Rehab. She does live by herself and would need to be independent before her discharge. She was very independent before surgery. Was able to drive herself.  Patient is doing well. Her pain is controlled. She did noticed some swelling around her shoulder and was concerned but now  it is resolved.  Past Medical History:  Diagnosis Date  . Anemia    FROM BLEEDING ULCER  . Anxiety    takes Alprazolam daily as needed  . Arthritis    dx with RA 2017  . Diverticulosis   . GERD (gastroesophageal reflux disease)   . Headache(784.0)    occasionally  . History of blood transfusion    no abnormal reaction noted  . History of bronchitis    last time many yrs ago  . Hyperlipidemia    PT DENIES THIS DX -  ON NO MEDS AND NO ONE HAS TOLD HER  . Hypertension    takes Amlodipine daily  . Hypothyroidism    takes Synthroid daily  . Insomnia   . Joint pain   . 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 Nocturia   . Numbness    occasionally left arm at night  . Osteoporosis    takes Fosamax weekly  . Peripheral edema    takes Lasix daily as needed  . PONV (postoperative nausea and vomiting)   . 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 Stomach ulcer    Past Surgical History:  Procedure Laterality Date  . ABDOMINAL HYSTERECTOMY    . cataract surgery Bilateral   . CHOLECYSTECTOMY    . COLONOSCOPY    . EYE SURGERY     CATARACTS BOTH  . gastric bypass surgery    . JOINT REPLACEMENT Bilateral    hip  . REVERSE SHOULDER ARTHROPLASTY Right 05/11/2016   Procedure: REVERSE SHOULDER ARTHROPLASTY;  Surgeon: Marchia Bond, MD;  Location: [facility]  OR;  Service: Orthopedics;  Laterality: Right;  . REVISION TOTAL HIP ARTHROPLASTY Left 10/03/2013   DR Mayer Camel  . TOTAL HIP REVISION Left 10/03/2013   Procedure: TOTAL HIP REVISION- left;  Surgeon: Kerin Salen, MD;  Location: [facility];  Service: Orthopedics;  Laterality: Left;  . TOTAL SHOULDER ARTHROPLASTY Left 11/09/2016   Procedure: TOTAL REVERSE SHOULDER ARTHROPLASTY;  Surgeon: Marchia Bond, MD;  Location: [facility];  Service: Orthopedics;  Laterality: Left;  . TOTAL THYROIDECTOMY      reports that she has never smoked. She has never used smokeless tobacco. She reports that she does not drink alcohol or use drugs. Social  History   Social History  . Marital status: Divorced    Spouse name: N/A  . Number of children: N/A  . Years of education: N/A   Occupational History  . Not on file.   Social History Main Topics  . Smoking status: Never Smoker  . Smokeless tobacco: Never Used  . Alcohol use No  . Drug use: No  . Sexual activity: Not on file   Other Topics Concern  . Not on file   Social History Narrative  . No narrative on file    Functional Status Survey:    History reviewed. No pertinent family history.  Health Maintenance  Topic Date Due  . DEXA SCAN  11/16/2017 (Originally 05/01/2007)  . COLONOSCOPY  11/16/2017 (Originally 04/30/1992)  . TETANUS/TDAP  11/16/2017 (Originally 04/30/1961)  . PNA vac Low Risk Adult (2 of 2 - PCV13) 11/16/2017 (Originally 11/12/2017)  . INFLUENZA VACCINE  03/02/2017  . MAMMOGRAM  09/21/2018    Allergies  Allergen Reactions  . Aspirin Other (See Comments)    Adult Aspirin: upset stomach / irritates ulcer     Allergies as of 11/17/2016      Reactions   Aspirin Other (See Comments)   Adult Aspirin: upset stomach / irritates ulcer       Medication List       Accurate as of 11/17/16  9:14 AM. Always use your most recent med list.          acetaminophen 325 MG tablet Commonly known as:  TYLENOL Take 650 mg by mouth every 6 (six) hours as needed.   alendronate 70 MG tablet Commonly known as:  FOSAMAX Take 70 mg by mouth every Sunday. Take with a full glass of water on an empty stomach.   ALPRAZolam 0.25 MG tablet Commonly known as:  XANAX Take 0.25 mg by mouth at bedtime as needed for anxiety or sleep.   amLODipine 5 MG tablet Commonly known as:  NORVASC Take 5 mg by mouth daily as needed (when blood pressure gets high).   aspirin EC 81 MG tablet Take 81 mg by mouth daily.   baclofen 10 MG tablet Commonly known as:  LIORESAL Take 1 tablet (10 mg total) by mouth 3 (three) times daily. As needed for muscle spasm   CALCIUM CARBONATE  PO Give 600 mg in the morning, 300 mg in the afternoon, and 600 mg at bedtime   Cholecalciferol 1000 units Tbdp Take 1,000 Units by mouth 2 (two) times daily.   diclofenac sodium 1 % Gel Commonly known as:  VOLTAREN Apply 2 g topically 2 (two) times daily as needed (for pain in knees and shoulder).   Fish Oil 1000 MG Caps Take 1,000 mg by mouth daily.   furosemide 40 MG tablet Commonly known as:  LASIX Take 40 mg by mouth daily.   hydroxychloroquine  200 MG tablet Commonly known as:  PLAQUENIL Take 200 mg by mouth 2 (two) times daily.   multivitamin with minerals Tabs tablet Take 1 tablet by mouth daily.   omeprazole 40 MG capsule Commonly known as:  PRILOSEC Take 40 mg by mouth daily.   ondansetron 4 MG tablet Commonly known as:  ZOFRAN Take 1 tablet (4 mg total) by mouth every 8 (eight) hours as needed for nausea or vomiting.   oxyCODONE 5 MG immediate release tablet Commonly known as:  ROXICODONE Take 1-2 tablets (5-10 mg total) by mouth every 4 (four) hours as needed for severe pain.   rivaroxaban 10 MG Tabs tablet Commonly known as:  XARELTO Take 1 tablet (10 mg total) by mouth daily.   sennosides-docusate sodium 8.6-50 MG tablet Commonly known as:  SENOKOT-S Take 2 tablets by mouth daily.   SYNTHROID 100 MCG tablet Generic drug:  levothyroxine Take 100 mcg by mouth daily before breakfast.   traMADol 50 MG tablet Commonly known as:  ULTRAM Take 50 mg by mouth every 6 (six) hours as needed for moderate pain.   traZODone 50 MG tablet Commonly known as:  DESYREL Take 50 mg by mouth at bedtime.       Review of Systems  Review of Systems  Constitutional: Negative for activity change, appetite change, chills, diaphoresis, fatigue and fever.  HENT: Negative for mouth sores, postnasal drip, rhinorrhea, sinus pain and sore throat.   Respiratory: Negative for apnea, cough, chest tightness, shortness of breath and wheezing.   Cardiovascular: Negative for chest  pain, palpitations and leg swelling.  Gastrointestinal: Negative for abdominal distention, abdominal pain, constipation, diarrhea, nausea and vomiting.  Genitourinary: Negative for dysuria and frequency.  Musculoskeletal: Negative for arthralgias, joint swelling and myalgias.  Skin: Negative for rash.  Neurological: Negative for dizziness, syncope, weakness, light-headedness and numbness.  Psychiatric/Behavioral: Negative for behavioral problems, confusion and sleep disturbance.     Vitals:   11/17/16 0901  BP: 138/60  Pulse: (!) 54  Resp: 20  Temp: 97.2 F (36.2 C)  TempSrc: Oral   There is no height or weight on file to calculate BMI. Physical Exam  Constitutional: She is oriented to person, place, and time. She appears well-developed and well-nourished.  HENT:  Head: Normocephalic.  Mouth/Throat: Oropharynx is clear and moist.  Eyes: Pupils are equal, round, and reactive to light.  Neck: Neck supple.  Cardiovascular: Normal rate, regular rhythm and normal heart sounds.   Pulmonary/Chest: Effort normal and breath sounds normal. No respiratory distress. She has no wheezes. She has no rales.  Abdominal: Soft. Bowel sounds are normal. She exhibits no distension. There is no tenderness. There is no rebound.  Musculoskeletal: She exhibits no edema.  Patient does not have any swelling near her shoulder. Her arm is swollen but it seems post op changes. And there is no tenderness.  Neurological: She is alert and oriented to person, place, and time.  No Focal deficit.  Skin: Skin is warm and dry.  Psychiatric: She has a normal mood and affect. Her behavior is normal. Thought content normal.    Labs reviewed: Basic Metabolic Panel:  Recent Labs  05/12/16 0303 05/17/16 10/29/16 1153 11/10/16 0712  NA 139 143 138 138  K 3.4* 4.1 4.2 4.1  CL 104  --  106 106  CO2 27  --  25 24  GLUCOSE 103*  --  85 100*  BUN 12 24* 9 9  CREATININE 0.66 0.8 0.68 0.59  CALCIUM 8.0*  --  9.5  8.8*   Liver Function Tests:  Recent Labs  05/17/16  AST 20  ALT 12  ALKPHOS 88   No results for input(s): LIPASE, AMYLASE in the last 8760 hours. No results for input(s): AMMONIA in the last 8760 hours. CBC:  Recent Labs  05/12/16 0303 05/17/16 10/29/16 1153 11/10/16 0712  WBC 5.8 5.0 4.3 5.4  NEUTROABS  --  3  --   --   HGB 9.5* 10.3* 11.3* 9.4*  HCT 28.2* 32* 34.1* 27.8*  MCV 93.1  --  93.2 93.3  PLT 202 342 261 234   Cardiac Enzymes: No results for input(s): CKTOTAL, CKMB, CKMBINDEX, TROPONINI in the last 8760 hours. BNP: Invalid input(s): POCBNP No results found for: HGBA1C No results found for: TSH No results found for: VITAMINB12 No results found for: FOLATE No results found for: IRON, TIBC, FERRITIN  Imaging and Procedures obtained prior to SNF admission: No results found.  Assessment/Plan  Status post replacement of left shoulder joint Patient doing well. Pain is controlled and she is working with the therapy. Continue Ultram, and Hydrocodone. Follow up with Ortho.Continue Xarelto for DVT Prophylaxis  Rheumatoid arthritis involving multiple sites,  Stable on Plaqunil  osteoporosis  Continue Fosamax.  Essential hypertension BP Stable on Norvasc.  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

## 2016-11-19 DIAGNOSIS — M19012 Primary osteoarthritis, left shoulder: Secondary | ICD-10-CM | POA: Diagnosis not present

## 2016-11-23 ENCOUNTER — Non-Acute Institutional Stay (SKILLED_NURSING_FACILITY): Payer: Medicare HMO | Admitting: Adult Health

## 2016-11-23 ENCOUNTER — Encounter: Payer: Self-pay | Admitting: Adult Health

## 2016-11-23 DIAGNOSIS — M069 Rheumatoid arthritis, unspecified: Secondary | ICD-10-CM | POA: Diagnosis not present

## 2016-11-23 DIAGNOSIS — M12812 Other specific arthropathies, not elsewhere classified, left shoulder: Secondary | ICD-10-CM | POA: Diagnosis not present

## 2016-11-23 DIAGNOSIS — Z96612 Presence of left artificial shoulder joint: Secondary | ICD-10-CM

## 2016-11-23 DIAGNOSIS — D649 Anemia, unspecified: Secondary | ICD-10-CM | POA: Diagnosis not present

## 2016-11-23 DIAGNOSIS — M75102 Unspecified rotator cuff tear or rupture of left shoulder, not specified as traumatic: Secondary | ICD-10-CM

## 2016-11-23 NOTE — Progress Notes (Signed)
Location:   [facility] Room Number: 121 A Place of Service:  SNF (31)    CODE STATUS: Full Code  Allergies  Allergen Reactions  . Aspirin Other (See Comments)    Adult Aspirin: upset stomach / irritates ulcer     Chief Complaint  Patient presents with  . Discharge Note    Discharge    HPI:  She is being discharged to home with home health for pt/ot/rn. She will need a standard wheelchair. She will need her prescriptions to be written and will need to follow up with her medical provider.  She had been hospitalized for a left shoulder arthroplasty. She was admitted to this facility for short term rehab.    Past Medical History:  Diagnosis Date  . Anemia    FROM BLEEDING ULCER  . Anxiety    takes Alprazolam daily as needed  . Arthritis    dx with RA 2017  . Diverticulosis   . GERD (gastroesophageal reflux disease)   . Headache(784.0)    occasionally  . History of blood transfusion    no abnormal reaction noted  . History of bronchitis    last time many yrs ago  . Hyperlipidemia    PT DENIES THIS DX -  ON NO MEDS AND NO ONE HAS TOLD HER  . Hypertension    takes Amlodipine daily  . Hypothyroidism    takes Synthroid daily  . Insomnia   . Joint pain   . 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 Nocturia   . Numbness    occasionally left arm at night  . Osteoporosis    takes Fosamax weekly  . Peripheral edema    takes Lasix daily as needed  . PONV (postoperative nausea and vomiting)   . 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 Stomach ulcer     Past Surgical History:  Procedure Laterality Date  . ABDOMINAL HYSTERECTOMY    . cataract surgery Bilateral   . CHOLECYSTECTOMY    . COLONOSCOPY    . EYE SURGERY     CATARACTS BOTH  . gastric bypass surgery    . JOINT REPLACEMENT Bilateral    hip  . REVERSE SHOULDER ARTHROPLASTY Right 05/11/2016   Procedure: REVERSE SHOULDER ARTHROPLASTY;  Surgeon: Marchia Bond, MD;  Location: [facility];   Service: Orthopedics;  Laterality: Right;  . REVISION TOTAL HIP ARTHROPLASTY Left 10/03/2013   DR Mayer Camel  . TOTAL HIP REVISION Left 10/03/2013   Procedure: TOTAL HIP REVISION- left;  Surgeon: Kerin Salen, MD;  Location: [place];  Service: Orthopedics;  Laterality: Left;  . TOTAL SHOULDER ARTHROPLASTY Left 11/09/2016   Procedure: TOTAL REVERSE SHOULDER ARTHROPLASTY;  Surgeon: Marchia Bond, MD;  Location: [place];  Service: Orthopedics;  Laterality: Left;  . TOTAL THYROIDECTOMY      Social History   Social History  . Marital status: Divorced    Spouse name: N/A  . Number of children: N/A  . Years of education: N/A   Occupational History  . Not on file.   Social History Main Topics  . Smoking status: Never Smoker  . Smokeless tobacco: Never Used  . Alcohol use No  . Drug use: No  . Sexual activity: Not on file   Other Topics Concern  . Not on file   Social History Narrative  . No narrative on file   History reviewed. No pertinent family history.  VITAL SIGNS BP 122/64   Pulse 74   Temp 98 F (  36.7 C)   Resp 18   Ht 4\' 8"  (1.422 m)   Wt 152 lb 9.6 oz (69.2 kg)   SpO2 99%   BMI 34.21 kg/m   Patient's Medications  New Prescriptions   No medications on file  Previous Medications   ACETAMINOPHEN (TYLENOL) 325 MG TABLET    Take 650 mg by mouth every 6 (six) hours as needed.   ALENDRONATE (FOSAMAX) 70 MG TABLET    Take 70 mg by mouth every Sunday. Take with a full glass of water on an empty stomach.   ALPRAZOLAM (XANAX) 0.25 MG TABLET    Take 0.25 mg by mouth at bedtime as needed for anxiety or sleep.   AMLODIPINE (NORVASC) 5 MG TABLET    Take 5 mg by mouth daily as needed (when blood pressure gets high).    ASPIRIN EC 81 MG TABLET    Take 81 mg by mouth daily.   BACLOFEN (LIORESAL) 10 MG TABLET    Take 1 tablet (10 mg total) by mouth 3 (three) times daily. As needed for muscle spasm   CALCIUM CARBONATE PO    Give 600 mg in the morning, 300 mg in the afternoon, and 600 mg  at bedtime   CHOLECALCIFEROL 1000 UNITS TBDP    Take 1,000 Units by mouth 2 (two) times daily.   DICLOFENAC SODIUM (VOLTAREN) 1 % GEL    Apply 2 g topically 2 (two) times daily as needed (for pain in knees and shoulder).   FUROSEMIDE (LASIX) 40 MG TABLET    Take 40 mg by mouth daily.    HYDROXYCHLOROQUINE (PLAQUENIL) 200 MG TABLET    Take 200 mg by mouth 2 (two) times daily.   MULTIPLE VITAMIN (MULTIVITAMIN WITH MINERALS) TABS TABLET    Take 1 tablet by mouth daily.    OMEGA-3 FATTY ACIDS (FISH OIL) 1000 MG CAPS    Take 1,000 mg by mouth daily.   OMEPRAZOLE (PRILOSEC) 40 MG CAPSULE    Take 40 mg by mouth daily.    ONDANSETRON (ZOFRAN) 4 MG TABLET    Take 1 tablet (4 mg total) by mouth every 8 (eight) hours as needed for nausea or vomiting.   OXYCODONE (ROXICODONE) 5 MG IMMEDIATE RELEASE TABLET    Take 1-2 tablets (5-10 mg total) by mouth every 4 (four) hours as needed for severe pain.   RIVAROXABAN (XARELTO) 10 MG TABS TABLET    Take 1 tablet (10 mg total) by mouth daily.   SENNOSIDES-DOCUSATE SODIUM (SENOKOT-S) 8.6-50 MG TABLET    Take 1 tablet by mouth daily.   SYNTHROID 100 MCG TABLET    Take 100 mcg by mouth daily before breakfast.    TRAMADOL (ULTRAM) 50 MG TABLET    Take 50 mg by mouth every 6 (six) hours as needed for moderate pain.    TRAZODONE (DESYREL) 50 MG TABLET    Take 50 mg by mouth at bedtime.  Modified Medications   No medications on file  Discontinued Medications   SENNOSIDES-DOCUSATE SODIUM (SENOKOT-S) 8.6-50 MG TABLET    Take 2 tablets by mouth daily.     SIGNIFICANT DIAGNOSTIC EXAMS  11-10-16: left shoulder x-ray: No acute finding after reverse glenohumeral arthroplasty on the left.   LABS REVIEWED:   11-10-16: wbc 5.4; hgb 9.4; hct 27.8; mcv 93.3; plt 234; glucose 100; bun 9; creat 0.59; k+ 4.1; na++ 138; ca 8.8     Review of Systems  Constitutional: Negative for malaise/fatigue.  Respiratory: Negative for cough and shortness  of breath.   Cardiovascular:  Negative for chest pain, palpitations and leg swelling.  Gastrointestinal: Negative for abdominal pain, constipation and heartburn.  Musculoskeletal: Negative for back pain, joint pain and myalgias.       Is status post left shoulder replacement   Skin: Negative.   Neurological: Negative for dizziness.  Psychiatric/Behavioral: The patient is not nervous/anxious.     Physical Exam  Constitutional: She is oriented to person, place, and time. No distress.  Eyes: Conjunctivae are normal.  Neck: Neck supple. No JVD present. No thyromegaly present.  Cardiovascular: Normal rate, regular rhythm and intact distal pulses.   Respiratory: Effort normal and breath sounds normal. No respiratory distress. She has no wheezes.  GI: Soft. Bowel sounds are normal. She exhibits no distension. There is no tenderness.  Musculoskeletal: She exhibits no edema.  Able to move all extremities  Left shoulder in sling   Lymphadenopathy:    She has no cervical adenopathy.  Neurological: She is alert and oriented to person, place, and time.  Skin: Skin is warm and dry. She is not diaphoretic.  Incision line without signs of infection   Psychiatric: She has a normal mood and affect.    ASSESSMENT/ PLAN:  Patient is being discharged with the following home health services:  Pt/ot/rn: to evaluate and treat as indicated for gait balance strength adl training and medication management   Patient is being discharged with the following durable medical equipment:  Standard wheelchair with elevated leg rests wheelchair cushion anti-tippers and brake extensions in order to allow her to maintain her current level of independence of her adls which cannot be achieved with a walker. She can self propel   Patient has been advised to f/u with their PCP in 1-2 weeks to bring them up to date on their rehab stay.  Social services at facility was responsible for arranging this appointment.  Pt was provided with a 30 day supply of  prescriptions for medications and refills must be obtained from their PCP.  For controlled substances, a more limited supply may be provided adequate until PCP appointment only.#10 xanax 0.25 mg tabs; #20 oxycodone 5 mg tabs; #20 ultram 50 mg tabs   Time spent with patient 45   minutes >50% time spent counseling; reviewing medical record; tests; labs; and developing future plan of care    Ok Edwards NP [facility]  Contact [phone] Monday through Friday 8am- 5pm  After hours call [phone]

## 2016-11-25 DIAGNOSIS — M12512 Traumatic arthropathy, left shoulder: Secondary | ICD-10-CM | POA: Diagnosis not present

## 2016-11-25 DIAGNOSIS — D849 Immunodeficiency, unspecified: Secondary | ICD-10-CM | POA: Diagnosis not present

## 2016-11-29 DIAGNOSIS — M1991 Primary osteoarthritis, unspecified site: Secondary | ICD-10-CM | POA: Diagnosis not present

## 2016-11-29 DIAGNOSIS — Z96612 Presence of left artificial shoulder joint: Secondary | ICD-10-CM | POA: Diagnosis not present

## 2016-11-29 DIAGNOSIS — I10 Essential (primary) hypertension: Secondary | ICD-10-CM | POA: Diagnosis not present

## 2016-11-29 DIAGNOSIS — M069 Rheumatoid arthritis, unspecified: Secondary | ICD-10-CM | POA: Diagnosis not present

## 2016-11-29 DIAGNOSIS — K219 Gastro-esophageal reflux disease without esophagitis: Secondary | ICD-10-CM | POA: Diagnosis not present

## 2016-11-29 DIAGNOSIS — D649 Anemia, unspecified: Secondary | ICD-10-CM | POA: Diagnosis not present

## 2016-11-29 DIAGNOSIS — M1990 Unspecified osteoarthritis, unspecified site: Secondary | ICD-10-CM | POA: Diagnosis not present

## 2016-11-29 DIAGNOSIS — R69 Illness, unspecified: Secondary | ICD-10-CM | POA: Diagnosis not present

## 2016-11-29 DIAGNOSIS — Z471 Aftercare following joint replacement surgery: Secondary | ICD-10-CM | POA: Diagnosis not present

## 2016-11-29 DIAGNOSIS — K5790 Diverticulosis of intestine, part unspecified, without perforation or abscess without bleeding: Secondary | ICD-10-CM | POA: Diagnosis not present

## 2016-11-29 DIAGNOSIS — E785 Hyperlipidemia, unspecified: Secondary | ICD-10-CM | POA: Diagnosis not present

## 2016-11-30 DIAGNOSIS — K219 Gastro-esophageal reflux disease without esophagitis: Secondary | ICD-10-CM | POA: Diagnosis not present

## 2016-11-30 DIAGNOSIS — Z471 Aftercare following joint replacement surgery: Secondary | ICD-10-CM | POA: Diagnosis not present

## 2016-11-30 DIAGNOSIS — I10 Essential (primary) hypertension: Secondary | ICD-10-CM | POA: Diagnosis not present

## 2016-11-30 DIAGNOSIS — M069 Rheumatoid arthritis, unspecified: Secondary | ICD-10-CM | POA: Diagnosis not present

## 2016-11-30 DIAGNOSIS — Z96612 Presence of left artificial shoulder joint: Secondary | ICD-10-CM | POA: Diagnosis not present

## 2016-11-30 DIAGNOSIS — R69 Illness, unspecified: Secondary | ICD-10-CM | POA: Diagnosis not present

## 2016-11-30 DIAGNOSIS — K5790 Diverticulosis of intestine, part unspecified, without perforation or abscess without bleeding: Secondary | ICD-10-CM | POA: Diagnosis not present

## 2016-11-30 DIAGNOSIS — D649 Anemia, unspecified: Secondary | ICD-10-CM | POA: Diagnosis not present

## 2016-11-30 DIAGNOSIS — E785 Hyperlipidemia, unspecified: Secondary | ICD-10-CM | POA: Diagnosis not present

## 2016-11-30 DIAGNOSIS — M1990 Unspecified osteoarthritis, unspecified site: Secondary | ICD-10-CM | POA: Diagnosis not present

## 2016-12-02 DIAGNOSIS — E785 Hyperlipidemia, unspecified: Secondary | ICD-10-CM | POA: Diagnosis not present

## 2016-12-02 DIAGNOSIS — K219 Gastro-esophageal reflux disease without esophagitis: Secondary | ICD-10-CM | POA: Diagnosis not present

## 2016-12-02 DIAGNOSIS — M069 Rheumatoid arthritis, unspecified: Secondary | ICD-10-CM | POA: Diagnosis not present

## 2016-12-02 DIAGNOSIS — Z471 Aftercare following joint replacement surgery: Secondary | ICD-10-CM | POA: Diagnosis not present

## 2016-12-02 DIAGNOSIS — I10 Essential (primary) hypertension: Secondary | ICD-10-CM | POA: Diagnosis not present

## 2016-12-02 DIAGNOSIS — M1990 Unspecified osteoarthritis, unspecified site: Secondary | ICD-10-CM | POA: Diagnosis not present

## 2016-12-02 DIAGNOSIS — D649 Anemia, unspecified: Secondary | ICD-10-CM | POA: Diagnosis not present

## 2016-12-02 DIAGNOSIS — Z96612 Presence of left artificial shoulder joint: Secondary | ICD-10-CM | POA: Diagnosis not present

## 2016-12-02 DIAGNOSIS — K5790 Diverticulosis of intestine, part unspecified, without perforation or abscess without bleeding: Secondary | ICD-10-CM | POA: Diagnosis not present

## 2016-12-02 DIAGNOSIS — R69 Illness, unspecified: Secondary | ICD-10-CM | POA: Diagnosis not present

## 2016-12-03 DIAGNOSIS — Z471 Aftercare following joint replacement surgery: Secondary | ICD-10-CM | POA: Diagnosis not present

## 2016-12-03 DIAGNOSIS — K219 Gastro-esophageal reflux disease without esophagitis: Secondary | ICD-10-CM | POA: Diagnosis not present

## 2016-12-03 DIAGNOSIS — K5790 Diverticulosis of intestine, part unspecified, without perforation or abscess without bleeding: Secondary | ICD-10-CM | POA: Diagnosis not present

## 2016-12-03 DIAGNOSIS — M1990 Unspecified osteoarthritis, unspecified site: Secondary | ICD-10-CM | POA: Diagnosis not present

## 2016-12-03 DIAGNOSIS — D649 Anemia, unspecified: Secondary | ICD-10-CM | POA: Diagnosis not present

## 2016-12-03 DIAGNOSIS — Z96612 Presence of left artificial shoulder joint: Secondary | ICD-10-CM | POA: Diagnosis not present

## 2016-12-03 DIAGNOSIS — M069 Rheumatoid arthritis, unspecified: Secondary | ICD-10-CM | POA: Diagnosis not present

## 2016-12-03 DIAGNOSIS — I10 Essential (primary) hypertension: Secondary | ICD-10-CM | POA: Diagnosis not present

## 2016-12-03 DIAGNOSIS — E785 Hyperlipidemia, unspecified: Secondary | ICD-10-CM | POA: Diagnosis not present

## 2016-12-03 DIAGNOSIS — R69 Illness, unspecified: Secondary | ICD-10-CM | POA: Diagnosis not present

## 2016-12-06 DIAGNOSIS — K5790 Diverticulosis of intestine, part unspecified, without perforation or abscess without bleeding: Secondary | ICD-10-CM | POA: Diagnosis not present

## 2016-12-06 DIAGNOSIS — M1990 Unspecified osteoarthritis, unspecified site: Secondary | ICD-10-CM | POA: Diagnosis not present

## 2016-12-06 DIAGNOSIS — Z96612 Presence of left artificial shoulder joint: Secondary | ICD-10-CM | POA: Diagnosis not present

## 2016-12-06 DIAGNOSIS — D649 Anemia, unspecified: Secondary | ICD-10-CM | POA: Diagnosis not present

## 2016-12-06 DIAGNOSIS — E785 Hyperlipidemia, unspecified: Secondary | ICD-10-CM | POA: Diagnosis not present

## 2016-12-06 DIAGNOSIS — K219 Gastro-esophageal reflux disease without esophagitis: Secondary | ICD-10-CM | POA: Diagnosis not present

## 2016-12-06 DIAGNOSIS — M069 Rheumatoid arthritis, unspecified: Secondary | ICD-10-CM | POA: Diagnosis not present

## 2016-12-06 DIAGNOSIS — R69 Illness, unspecified: Secondary | ICD-10-CM | POA: Diagnosis not present

## 2016-12-06 DIAGNOSIS — Z471 Aftercare following joint replacement surgery: Secondary | ICD-10-CM | POA: Diagnosis not present

## 2016-12-06 DIAGNOSIS — I10 Essential (primary) hypertension: Secondary | ICD-10-CM | POA: Diagnosis not present

## 2016-12-07 DIAGNOSIS — I10 Essential (primary) hypertension: Secondary | ICD-10-CM | POA: Diagnosis not present

## 2016-12-07 DIAGNOSIS — R600 Localized edema: Secondary | ICD-10-CM | POA: Diagnosis not present

## 2016-12-07 DIAGNOSIS — S46002D Unspecified injury of muscle(s) and tendon(s) of the rotator cuff of left shoulder, subsequent encounter: Secondary | ICD-10-CM | POA: Diagnosis not present

## 2016-12-07 DIAGNOSIS — M0609 Rheumatoid arthritis without rheumatoid factor, multiple sites: Secondary | ICD-10-CM | POA: Diagnosis not present

## 2016-12-08 ENCOUNTER — Other Ambulatory Visit (HOSPITAL_COMMUNITY): Payer: Self-pay | Admitting: Pulmonary Disease

## 2016-12-08 DIAGNOSIS — M069 Rheumatoid arthritis, unspecified: Secondary | ICD-10-CM | POA: Diagnosis not present

## 2016-12-08 DIAGNOSIS — Z96612 Presence of left artificial shoulder joint: Secondary | ICD-10-CM | POA: Diagnosis not present

## 2016-12-08 DIAGNOSIS — I10 Essential (primary) hypertension: Secondary | ICD-10-CM | POA: Diagnosis not present

## 2016-12-08 DIAGNOSIS — M1990 Unspecified osteoarthritis, unspecified site: Secondary | ICD-10-CM | POA: Diagnosis not present

## 2016-12-08 DIAGNOSIS — K219 Gastro-esophageal reflux disease without esophagitis: Secondary | ICD-10-CM | POA: Diagnosis not present

## 2016-12-08 DIAGNOSIS — R69 Illness, unspecified: Secondary | ICD-10-CM | POA: Diagnosis not present

## 2016-12-08 DIAGNOSIS — Z471 Aftercare following joint replacement surgery: Secondary | ICD-10-CM | POA: Diagnosis not present

## 2016-12-08 DIAGNOSIS — R609 Edema, unspecified: Secondary | ICD-10-CM

## 2016-12-08 DIAGNOSIS — D649 Anemia, unspecified: Secondary | ICD-10-CM | POA: Diagnosis not present

## 2016-12-08 DIAGNOSIS — K5790 Diverticulosis of intestine, part unspecified, without perforation or abscess without bleeding: Secondary | ICD-10-CM | POA: Diagnosis not present

## 2016-12-08 DIAGNOSIS — E785 Hyperlipidemia, unspecified: Secondary | ICD-10-CM | POA: Diagnosis not present

## 2016-12-09 ENCOUNTER — Ambulatory Visit (HOSPITAL_COMMUNITY)
Admission: RE | Admit: 2016-12-09 | Discharge: 2016-12-09 | Disposition: A | Discharge status: Home / Self Care | Payer: Medicare HMO | Source: Ambulatory Visit | Attending: Pulmonary Disease | Admitting: Pulmonary Disease

## 2016-12-09 DIAGNOSIS — E785 Hyperlipidemia, unspecified: Secondary | ICD-10-CM | POA: Diagnosis not present

## 2016-12-09 DIAGNOSIS — K219 Gastro-esophageal reflux disease without esophagitis: Secondary | ICD-10-CM | POA: Diagnosis not present

## 2016-12-09 DIAGNOSIS — I313 Pericardial effusion (noninflammatory): Secondary | ICD-10-CM | POA: Insufficient documentation

## 2016-12-09 DIAGNOSIS — R609 Edema, unspecified: Secondary | ICD-10-CM | POA: Insufficient documentation

## 2016-12-09 DIAGNOSIS — I358 Other nonrheumatic aortic valve disorders: Secondary | ICD-10-CM | POA: Diagnosis not present

## 2016-12-09 DIAGNOSIS — I119 Hypertensive heart disease without heart failure: Secondary | ICD-10-CM | POA: Diagnosis not present

## 2016-12-09 DIAGNOSIS — M069 Rheumatoid arthritis, unspecified: Secondary | ICD-10-CM | POA: Diagnosis not present

## 2016-12-09 NOTE — Progress Notes (Signed)
*  PRELIMINARY RESULTS* Echocardiogram 2D Echocardiogram has been performed.  Leavy Cella 12/09/2016, 2:51 PM

## 2016-12-10 DIAGNOSIS — M069 Rheumatoid arthritis, unspecified: Secondary | ICD-10-CM | POA: Diagnosis not present

## 2016-12-10 DIAGNOSIS — Z471 Aftercare following joint replacement surgery: Secondary | ICD-10-CM | POA: Diagnosis not present

## 2016-12-10 DIAGNOSIS — I10 Essential (primary) hypertension: Secondary | ICD-10-CM | POA: Diagnosis not present

## 2016-12-10 DIAGNOSIS — M1990 Unspecified osteoarthritis, unspecified site: Secondary | ICD-10-CM | POA: Diagnosis not present

## 2016-12-10 DIAGNOSIS — K5790 Diverticulosis of intestine, part unspecified, without perforation or abscess without bleeding: Secondary | ICD-10-CM | POA: Diagnosis not present

## 2016-12-10 DIAGNOSIS — E785 Hyperlipidemia, unspecified: Secondary | ICD-10-CM | POA: Diagnosis not present

## 2016-12-10 DIAGNOSIS — K219 Gastro-esophageal reflux disease without esophagitis: Secondary | ICD-10-CM | POA: Diagnosis not present

## 2016-12-10 DIAGNOSIS — Z96612 Presence of left artificial shoulder joint: Secondary | ICD-10-CM | POA: Diagnosis not present

## 2016-12-10 DIAGNOSIS — R69 Illness, unspecified: Secondary | ICD-10-CM | POA: Diagnosis not present

## 2016-12-10 DIAGNOSIS — D649 Anemia, unspecified: Secondary | ICD-10-CM | POA: Diagnosis not present

## 2016-12-13 DIAGNOSIS — M1990 Unspecified osteoarthritis, unspecified site: Secondary | ICD-10-CM | POA: Diagnosis not present

## 2016-12-13 DIAGNOSIS — Z96612 Presence of left artificial shoulder joint: Secondary | ICD-10-CM | POA: Diagnosis not present

## 2016-12-13 DIAGNOSIS — D649 Anemia, unspecified: Secondary | ICD-10-CM | POA: Diagnosis not present

## 2016-12-13 DIAGNOSIS — Z471 Aftercare following joint replacement surgery: Secondary | ICD-10-CM | POA: Diagnosis not present

## 2016-12-13 DIAGNOSIS — R69 Illness, unspecified: Secondary | ICD-10-CM | POA: Diagnosis not present

## 2016-12-13 DIAGNOSIS — I10 Essential (primary) hypertension: Secondary | ICD-10-CM | POA: Diagnosis not present

## 2016-12-13 DIAGNOSIS — K219 Gastro-esophageal reflux disease without esophagitis: Secondary | ICD-10-CM | POA: Diagnosis not present

## 2016-12-13 DIAGNOSIS — E785 Hyperlipidemia, unspecified: Secondary | ICD-10-CM | POA: Diagnosis not present

## 2016-12-13 DIAGNOSIS — M069 Rheumatoid arthritis, unspecified: Secondary | ICD-10-CM | POA: Diagnosis not present

## 2016-12-13 DIAGNOSIS — K5790 Diverticulosis of intestine, part unspecified, without perforation or abscess without bleeding: Secondary | ICD-10-CM | POA: Diagnosis not present

## 2016-12-17 DIAGNOSIS — M19012 Primary osteoarthritis, left shoulder: Secondary | ICD-10-CM | POA: Diagnosis not present

## 2016-12-20 DIAGNOSIS — R69 Illness, unspecified: Secondary | ICD-10-CM | POA: Diagnosis not present

## 2016-12-20 DIAGNOSIS — I10 Essential (primary) hypertension: Secondary | ICD-10-CM | POA: Diagnosis not present

## 2016-12-20 DIAGNOSIS — Z471 Aftercare following joint replacement surgery: Secondary | ICD-10-CM | POA: Diagnosis not present

## 2016-12-20 DIAGNOSIS — E785 Hyperlipidemia, unspecified: Secondary | ICD-10-CM | POA: Diagnosis not present

## 2016-12-20 DIAGNOSIS — D649 Anemia, unspecified: Secondary | ICD-10-CM | POA: Diagnosis not present

## 2016-12-20 DIAGNOSIS — M069 Rheumatoid arthritis, unspecified: Secondary | ICD-10-CM | POA: Diagnosis not present

## 2016-12-20 DIAGNOSIS — K5790 Diverticulosis of intestine, part unspecified, without perforation or abscess without bleeding: Secondary | ICD-10-CM | POA: Diagnosis not present

## 2016-12-20 DIAGNOSIS — K219 Gastro-esophageal reflux disease without esophagitis: Secondary | ICD-10-CM | POA: Diagnosis not present

## 2016-12-20 DIAGNOSIS — Z96612 Presence of left artificial shoulder joint: Secondary | ICD-10-CM | POA: Diagnosis not present

## 2016-12-20 DIAGNOSIS — M1990 Unspecified osteoarthritis, unspecified site: Secondary | ICD-10-CM | POA: Diagnosis not present

## 2016-12-22 DIAGNOSIS — Z471 Aftercare following joint replacement surgery: Secondary | ICD-10-CM | POA: Diagnosis not present

## 2016-12-22 DIAGNOSIS — D649 Anemia, unspecified: Secondary | ICD-10-CM | POA: Diagnosis not present

## 2016-12-22 DIAGNOSIS — Z96612 Presence of left artificial shoulder joint: Secondary | ICD-10-CM | POA: Diagnosis not present

## 2016-12-22 DIAGNOSIS — M1990 Unspecified osteoarthritis, unspecified site: Secondary | ICD-10-CM | POA: Diagnosis not present

## 2016-12-22 DIAGNOSIS — R69 Illness, unspecified: Secondary | ICD-10-CM | POA: Diagnosis not present

## 2016-12-22 DIAGNOSIS — K5790 Diverticulosis of intestine, part unspecified, without perforation or abscess without bleeding: Secondary | ICD-10-CM | POA: Diagnosis not present

## 2016-12-22 DIAGNOSIS — K219 Gastro-esophageal reflux disease without esophagitis: Secondary | ICD-10-CM | POA: Diagnosis not present

## 2016-12-22 DIAGNOSIS — M069 Rheumatoid arthritis, unspecified: Secondary | ICD-10-CM | POA: Diagnosis not present

## 2016-12-22 DIAGNOSIS — I10 Essential (primary) hypertension: Secondary | ICD-10-CM | POA: Diagnosis not present

## 2016-12-22 DIAGNOSIS — E785 Hyperlipidemia, unspecified: Secondary | ICD-10-CM | POA: Diagnosis not present

## 2016-12-23 ENCOUNTER — Ambulatory Visit (INDEPENDENT_AMBULATORY_CARE_PROVIDER_SITE_OTHER): Payer: Medicare HMO | Admitting: Cardiology

## 2016-12-23 ENCOUNTER — Encounter: Payer: Self-pay | Admitting: Cardiology

## 2016-12-23 VITALS — BP 91/59 | HR 68 | Ht 60.0 in | Wt 143.0 lb

## 2016-12-23 DIAGNOSIS — M7989 Other specified soft tissue disorders: Secondary | ICD-10-CM | POA: Diagnosis not present

## 2016-12-23 DIAGNOSIS — Z79899 Other long term (current) drug therapy: Secondary | ICD-10-CM

## 2016-12-23 MED ORDER — FUROSEMIDE 20 MG PO TABS: ORAL_TABLET | ORAL | 3 refills | Status: DC

## 2016-12-23 NOTE — Progress Notes (Signed)
Clinical Summary Kathy [name] is a 76 y.o.female seen as new patient, referred by Dr Luan Pulling for leg edema.   1. Leg edema - echo 11/2016 LVEF 55-60%, no WMAs, grade I diastolic dysfunction, PASP 20, mild RV dilatation - norvasc stopped by pcp recently - 10/2016 labs K 4.1, Cr 0.59  - ongoing over the last few months. Usually resolves at night.  - No SOB or DOE.  - has been on lasix x 2-3 years.  - limits salt intake.   Past Medical History:  Diagnosis Date  . Anemia    FROM BLEEDING ULCER  . Anxiety    takes Alprazolam daily as needed  . Arthritis    dx with RA 2017  . Diverticulosis   . GERD (gastroesophageal reflux disease)   . Headache(784.0)    occasionally  . History of blood transfusion    no abnormal reaction noted  . History of bronchitis    last time many yrs ago  . Hyperlipidemia    PT DENIES THIS DX -  ON NO MEDS AND NO ONE HAS TOLD HER  . Hypertension    takes Amlodipine daily  . Hypothyroidism    takes Synthroid daily  . Insomnia   . Joint pain   . 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 Nocturia   . Numbness    occasionally left arm at night  . Osteoporosis    takes Fosamax weekly  . Peripheral edema    takes Lasix daily as needed  . PONV (postoperative nausea and vomiting)   . 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 Stomach ulcer      Allergies  Allergen Reactions  . Aspirin Other (See Comments)    Adult Aspirin: upset stomach / irritates ulcer      Current Outpatient Prescriptions  Medication Sig Dispense Refill  . acetaminophen (TYLENOL) 325 MG tablet Take 650 mg by mouth every 6 (six) hours as needed.    Marland Kitchen alendronate (FOSAMAX) 70 MG tablet Take 70 mg by mouth every Sunday. Take with a full glass of water on an empty stomach.    . ALPRAZolam (XANAX) 0.25 MG tablet Take 0.25 mg by mouth at bedtime as needed for anxiety or sleep.    Marland Kitchen amLODipine (NORVASC) 5 MG tablet Take 5 mg by mouth daily as needed (when blood  pressure gets high).     Marland Kitchen aspirin EC 81 MG tablet Take 81 mg by mouth daily.    . baclofen (LIORESAL) 10 MG tablet Take 1 tablet (10 mg total) by mouth 3 (three) times daily. As needed for muscle spasm 50 tablet 0  . CALCIUM CARBONATE PO Give 600 mg in the morning, 300 mg in the afternoon, and 600 mg at bedtime    . Cholecalciferol 1000 UNITS TBDP Take 1,000 Units by mouth 2 (two) times daily.    . diclofenac sodium (VOLTAREN) 1 % GEL Apply 2 g topically 2 (two) times daily as needed (for pain in knees and shoulder).    . furosemide (LASIX) 40 MG tablet Take 40 mg by mouth daily.     . hydroxychloroquine (PLAQUENIL) 200 MG tablet Take 200 mg by mouth 2 (two) times daily.    . Multiple Vitamin (MULTIVITAMIN WITH MINERALS) TABS tablet Take 1 tablet by mouth daily.     . Omega-3 Fatty Acids (FISH OIL) 1000 MG CAPS Take 1,000 mg by mouth daily.    Marland Kitchen omeprazole (PRILOSEC) 40 MG capsule Take 40 mg  by mouth daily.     . ondansetron (ZOFRAN) 4 MG tablet Take 1 tablet (4 mg total) by mouth every 8 (eight) hours as needed for nausea or vomiting. 30 tablet 0  . oxyCODONE (ROXICODONE) 5 MG immediate release tablet Take 1-2 tablets (5-10 mg total) by mouth every 4 (four) hours as needed for severe pain. 50 tablet 0  . rivaroxaban (XARELTO) 10 MG TABS tablet Take 1 tablet (10 mg total) by mouth daily. 21 tablet 0  . sennosides-docusate sodium (SENOKOT-S) 8.6-50 MG tablet Take 1 tablet by mouth daily.    Marland Kitchen SYNTHROID 100 MCG tablet Take 100 mcg by mouth daily before breakfast.     . traMADol (ULTRAM) 50 MG tablet Take 50 mg by mouth every 6 (six) hours as needed for moderate pain.     . traZODone (DESYREL) 50 MG tablet Take 50 mg by mouth at bedtime.     No current facility-administered medications for this visit.      Past Surgical History:  Procedure Laterality Date  . ABDOMINAL HYSTERECTOMY    . cataract surgery Bilateral   . CHOLECYSTECTOMY    . COLONOSCOPY    . EYE SURGERY     CATARACTS BOTH  .  gastric bypass surgery    . JOINT REPLACEMENT Bilateral    hip  . REVERSE SHOULDER ARTHROPLASTY Right 05/11/2016   Procedure: REVERSE SHOULDER ARTHROPLASTY;  Surgeon: Marchia Bond, MD;  Location: [facility];  Service: Orthopedics;  Laterality: Right;  . REVISION TOTAL HIP ARTHROPLASTY Left 10/03/2013   DR Mayer Camel  . TOTAL HIP REVISION Left 10/03/2013   Procedure: TOTAL HIP REVISION- left;  Surgeon: Kerin Salen, MD;  Location: [facility];  Service: Orthopedics;  Laterality: Left;  . TOTAL SHOULDER ARTHROPLASTY Left 11/09/2016   Procedure: TOTAL REVERSE SHOULDER ARTHROPLASTY;  Surgeon: Marchia Bond, MD;  Location: [place];  Service: Orthopedics;  Laterality: Left;  . TOTAL THYROIDECTOMY       Allergies  Allergen Reactions  . Aspirin Other (See Comments)    Adult Aspirin: upset stomach / irritates ulcer       No family history on file.   Social History Ms. Argo reports that she has never smoked. She has never used smokeless tobacco. Ms. Donado reports that she does not drink alcohol.   Review of Systems CONSTITUTIONAL: No weight loss, fever, chills, weakness or fatigue.  HEENT: Eyes: No visual loss, blurred vision, double vision or yellow sclerae.No hearing loss, sneezing, congestion, runny nose or sore throat.  SKIN: No rash or itching.  CARDIOVASCULAR: per hpi RESPIRATORY: No shortness of breath, cough or sputum.  GASTROINTESTINAL: No anorexia, nausea, vomiting or diarrhea. No abdominal pain or blood.  GENITOURINARY: No burning on urination, no polyuria NEUROLOGICAL: No headache, dizziness, syncope, paralysis, ataxia, numbness or tingling in the extremities. No change in bowel or bladder control.  MUSCULOSKELETAL: No muscle, back pain, joint pain or stiffness.  LYMPHATICS: No enlarged nodes. No history of splenectomy.  PSYCHIATRIC: No history of depression or anxiety.  ENDOCRINOLOGIC: No reports of sweating, cold or heat intolerance. No polyuria or polydipsia.  Marland Kitchen   Physical  Examination Vitals:   12/23/16 0953 12/23/16 1001  BP: 102/69 (!) 91/59  Pulse: 69 68   Vitals:   12/23/16 0953  Weight: 143 lb (64.9 kg)  Height: 5' (1.524 m)    Gen: resting comfortably, no acute distress HEENT: no scleral icterus, pupils equal round and reactive, no palptable cervical adenopathy,  : RRR, no m/r/g, no jvd Resp:  Clear to auscultation bilaterally GI: abdomen is soft, non-tender, non-distended, normal bowel sounds, no hepatosplenomegaly MSK: extremities are warm, 1+ bilateral LE edema Skin: warm, no rash Neuro:  no focal deficits Psych: appropriate affect   Diagnostic Studies 11/2016 echo Study Conclusions  - Left ventricle: The cavity size was normal. Wall thickness was   normal. Systolic function was normal. The estimated ejection   fraction was in the range of 55% to 60%. Wall motion was normal;   there were no regional wall motion abnormalities. Doppler   parameters are consistent with abnormal left ventricular   relaxation (grade 1 diastolic dysfunction). - Aortic valve: Mildly calcified annulus. Trileaflet. - Mitral valve: There was trivial regurgitation. - Right ventricle: The cavity size was mildly dilated. - Right atrium: The atrium was at the upper limits of normal in   size. - Atrial septum: No defect or patent foramen ovale was identified. - Tricuspid valve: There was trivial regurgitation. - Pulmonary arteries: PA peak pressure: 20 mm Hg (S). - Pericardium, extracardiac: A small pericardial effusion was   identified circumferential to the heart.  Impressions:  - Normal LV wall thickness with LVEF 55-60% and grade 1 diastolic   dysfunction. Trivial mitral regurgitation. Mildly calcified   aortic annulus. Mild dilated right ventricle and upper normal   right atrial chamber size. Trivial tricuspid regurgitation with   PASP estimated 20 mmHg. Small circumferential pericardial   effusion.    Assessment and Plan  1. LE edema - echo  with mild LV diastolic dysfunction, may be playing some role. Likely some component of venous insufficiency as well - will give Rx for compression stockings.  - may take additional 20mg  of lasix as needed for additional swelling - repeat BMET/Mg in 2 weeks   F/u 6 months      Arnoldo Lenis, M.D., F.A.C.C.

## 2016-12-23 NOTE — Patient Instructions (Signed)
Your physician wants you to follow-up in:  6 months with Dr Bryna Colander will receive a reminder letter in the mail two months in advance. If you don't receive a letter, please call our office to schedule the follow-up appointment.   Continue to take lasix 40 mg daily, you make take an ADDITIONAL 20 mg daily for leg swelling    In 2 weeks (June 7th )  Get lab work :Bmet, Magnesium    Use compression stockings for leg swelling      Thank you for choosing [facility] !

## 2016-12-25 DIAGNOSIS — M12512 Traumatic arthropathy, left shoulder: Secondary | ICD-10-CM | POA: Diagnosis not present

## 2016-12-25 DIAGNOSIS — D849 Immunodeficiency, unspecified: Secondary | ICD-10-CM | POA: Diagnosis not present

## 2016-12-28 DIAGNOSIS — K5790 Diverticulosis of intestine, part unspecified, without perforation or abscess without bleeding: Secondary | ICD-10-CM | POA: Diagnosis not present

## 2016-12-28 DIAGNOSIS — D649 Anemia, unspecified: Secondary | ICD-10-CM | POA: Diagnosis not present

## 2016-12-28 DIAGNOSIS — M069 Rheumatoid arthritis, unspecified: Secondary | ICD-10-CM | POA: Diagnosis not present

## 2016-12-28 DIAGNOSIS — E785 Hyperlipidemia, unspecified: Secondary | ICD-10-CM | POA: Diagnosis not present

## 2016-12-28 DIAGNOSIS — I10 Essential (primary) hypertension: Secondary | ICD-10-CM | POA: Diagnosis not present

## 2016-12-28 DIAGNOSIS — K219 Gastro-esophageal reflux disease without esophagitis: Secondary | ICD-10-CM | POA: Diagnosis not present

## 2016-12-28 DIAGNOSIS — Z96612 Presence of left artificial shoulder joint: Secondary | ICD-10-CM | POA: Diagnosis not present

## 2016-12-28 DIAGNOSIS — Z471 Aftercare following joint replacement surgery: Secondary | ICD-10-CM | POA: Diagnosis not present

## 2016-12-28 DIAGNOSIS — M1990 Unspecified osteoarthritis, unspecified site: Secondary | ICD-10-CM | POA: Diagnosis not present

## 2016-12-28 DIAGNOSIS — R69 Illness, unspecified: Secondary | ICD-10-CM | POA: Diagnosis not present

## 2016-12-29 DIAGNOSIS — K5790 Diverticulosis of intestine, part unspecified, without perforation or abscess without bleeding: Secondary | ICD-10-CM | POA: Diagnosis not present

## 2016-12-29 DIAGNOSIS — M069 Rheumatoid arthritis, unspecified: Secondary | ICD-10-CM | POA: Diagnosis not present

## 2016-12-29 DIAGNOSIS — Z471 Aftercare following joint replacement surgery: Secondary | ICD-10-CM | POA: Diagnosis not present

## 2016-12-29 DIAGNOSIS — K219 Gastro-esophageal reflux disease without esophagitis: Secondary | ICD-10-CM | POA: Diagnosis not present

## 2016-12-29 DIAGNOSIS — I10 Essential (primary) hypertension: Secondary | ICD-10-CM | POA: Diagnosis not present

## 2016-12-29 DIAGNOSIS — R69 Illness, unspecified: Secondary | ICD-10-CM | POA: Diagnosis not present

## 2016-12-29 DIAGNOSIS — Z96612 Presence of left artificial shoulder joint: Secondary | ICD-10-CM | POA: Diagnosis not present

## 2016-12-29 DIAGNOSIS — M1990 Unspecified osteoarthritis, unspecified site: Secondary | ICD-10-CM | POA: Diagnosis not present

## 2016-12-29 DIAGNOSIS — D649 Anemia, unspecified: Secondary | ICD-10-CM | POA: Diagnosis not present

## 2016-12-29 DIAGNOSIS — E785 Hyperlipidemia, unspecified: Secondary | ICD-10-CM | POA: Diagnosis not present

## 2017-01-04 DIAGNOSIS — Z471 Aftercare following joint replacement surgery: Secondary | ICD-10-CM | POA: Diagnosis not present

## 2017-01-04 DIAGNOSIS — D649 Anemia, unspecified: Secondary | ICD-10-CM | POA: Diagnosis not present

## 2017-01-04 DIAGNOSIS — Z96612 Presence of left artificial shoulder joint: Secondary | ICD-10-CM | POA: Diagnosis not present

## 2017-01-04 DIAGNOSIS — K219 Gastro-esophageal reflux disease without esophagitis: Secondary | ICD-10-CM | POA: Diagnosis not present

## 2017-01-04 DIAGNOSIS — M069 Rheumatoid arthritis, unspecified: Secondary | ICD-10-CM | POA: Diagnosis not present

## 2017-01-04 DIAGNOSIS — I10 Essential (primary) hypertension: Secondary | ICD-10-CM | POA: Diagnosis not present

## 2017-01-04 DIAGNOSIS — E785 Hyperlipidemia, unspecified: Secondary | ICD-10-CM | POA: Diagnosis not present

## 2017-01-04 DIAGNOSIS — K5790 Diverticulosis of intestine, part unspecified, without perforation or abscess without bleeding: Secondary | ICD-10-CM | POA: Diagnosis not present

## 2017-01-04 DIAGNOSIS — M1990 Unspecified osteoarthritis, unspecified site: Secondary | ICD-10-CM | POA: Diagnosis not present

## 2017-01-04 DIAGNOSIS — R69 Illness, unspecified: Secondary | ICD-10-CM | POA: Diagnosis not present

## 2017-01-06 ENCOUNTER — Other Ambulatory Visit (HOSPITAL_COMMUNITY)
Admission: RE | Admit: 2017-01-06 | Discharge: 2017-01-06 | Disposition: A | Discharge status: Home / Self Care | Payer: Medicare HMO | Source: Ambulatory Visit | Attending: Cardiology | Admitting: Cardiology

## 2017-01-06 DIAGNOSIS — E785 Hyperlipidemia, unspecified: Secondary | ICD-10-CM | POA: Diagnosis not present

## 2017-01-06 DIAGNOSIS — Z96612 Presence of left artificial shoulder joint: Secondary | ICD-10-CM | POA: Diagnosis not present

## 2017-01-06 DIAGNOSIS — D649 Anemia, unspecified: Secondary | ICD-10-CM | POA: Diagnosis not present

## 2017-01-06 DIAGNOSIS — Z471 Aftercare following joint replacement surgery: Secondary | ICD-10-CM | POA: Diagnosis not present

## 2017-01-06 DIAGNOSIS — M7989 Other specified soft tissue disorders: Secondary | ICD-10-CM | POA: Diagnosis not present

## 2017-01-06 DIAGNOSIS — R69 Illness, unspecified: Secondary | ICD-10-CM | POA: Diagnosis not present

## 2017-01-06 DIAGNOSIS — K5790 Diverticulosis of intestine, part unspecified, without perforation or abscess without bleeding: Secondary | ICD-10-CM | POA: Diagnosis not present

## 2017-01-06 DIAGNOSIS — M069 Rheumatoid arthritis, unspecified: Secondary | ICD-10-CM | POA: Diagnosis not present

## 2017-01-06 DIAGNOSIS — I10 Essential (primary) hypertension: Secondary | ICD-10-CM | POA: Diagnosis not present

## 2017-01-06 DIAGNOSIS — K219 Gastro-esophageal reflux disease without esophagitis: Secondary | ICD-10-CM | POA: Diagnosis not present

## 2017-01-06 DIAGNOSIS — Z79899 Other long term (current) drug therapy: Secondary | ICD-10-CM | POA: Diagnosis not present

## 2017-01-06 DIAGNOSIS — M1990 Unspecified osteoarthritis, unspecified site: Secondary | ICD-10-CM | POA: Diagnosis not present

## 2017-01-06 LAB — MAGNESIUM: Magnesium: 1.8 mg/dL (ref 1.7–2.4)

## 2017-01-06 LAB — BASIC METABOLIC PANEL
Anion gap: 9 (ref 5–15)
BUN: 19 mg/dL (ref 6–20)
CO2: 29 mmol/L (ref 22–32)
Calcium: 9 mg/dL (ref 8.9–10.3)
Chloride: 100 mmol/L — ABNORMAL LOW (ref 101–111)
Creatinine, Ser: 0.75 mg/dL (ref 0.44–1.00)
GFR calc Af Amer: >60 mL/min (ref >60)
GFR calc non Af Amer: >60 mL/min (ref >60)
Glucose, Bld: 95 mg/dL (ref 65–99)
Potassium: 3.2 mmol/L — ABNORMAL LOW (ref 3.5–5.1)
Sodium: 138 mmol/L (ref 135–145)

## 2017-01-11 ENCOUNTER — Telehealth: Payer: Self-pay

## 2017-01-11 DIAGNOSIS — M1990 Unspecified osteoarthritis, unspecified site: Secondary | ICD-10-CM | POA: Diagnosis not present

## 2017-01-11 DIAGNOSIS — D649 Anemia, unspecified: Secondary | ICD-10-CM | POA: Diagnosis not present

## 2017-01-11 DIAGNOSIS — K5790 Diverticulosis of intestine, part unspecified, without perforation or abscess without bleeding: Secondary | ICD-10-CM | POA: Diagnosis not present

## 2017-01-11 DIAGNOSIS — Z471 Aftercare following joint replacement surgery: Secondary | ICD-10-CM | POA: Diagnosis not present

## 2017-01-11 DIAGNOSIS — E785 Hyperlipidemia, unspecified: Secondary | ICD-10-CM | POA: Diagnosis not present

## 2017-01-11 DIAGNOSIS — K219 Gastro-esophageal reflux disease without esophagitis: Secondary | ICD-10-CM | POA: Diagnosis not present

## 2017-01-11 DIAGNOSIS — M069 Rheumatoid arthritis, unspecified: Secondary | ICD-10-CM | POA: Diagnosis not present

## 2017-01-11 DIAGNOSIS — R69 Illness, unspecified: Secondary | ICD-10-CM | POA: Diagnosis not present

## 2017-01-11 DIAGNOSIS — I10 Essential (primary) hypertension: Secondary | ICD-10-CM | POA: Diagnosis not present

## 2017-01-11 DIAGNOSIS — Z96612 Presence of left artificial shoulder joint: Secondary | ICD-10-CM | POA: Diagnosis not present

## 2017-01-11 MED ORDER — POTASSIUM CHLORIDE CRYS ER 20 MEQ PO TBCR: 40.0000 meq | EXTENDED_RELEASE_TABLET | Freq: Every day | ORAL | 1 refills | Status: DC

## 2017-01-11 NOTE — Telephone Encounter (Signed)
Informed pt of lab results and medication change. She voiced understanding. She had been taking 60 mg of lasix daily ( 40 mg in the morning and 20 mg in the evening. ) She will start taking 40 mg of lasix daily and taking the other 20 mg as needed.

## 2017-01-11 NOTE — Telephone Encounter (Signed)
-----   Message from Arnoldo Lenis, MD sent at 01/11/2017  1:24 PM EDT ----- Labs show potassium is low. Have her take 60meq KCl daily x 2 days. After that she should take on days that she takes her extra lasix. Verify how she has been taking lasix, I believe she is on 40 daily and can take extra 20 as needed.    Zandra Abts MD

## 2017-01-12 DIAGNOSIS — Z96612 Presence of left artificial shoulder joint: Secondary | ICD-10-CM | POA: Diagnosis not present

## 2017-01-12 DIAGNOSIS — M1990 Unspecified osteoarthritis, unspecified site: Secondary | ICD-10-CM | POA: Diagnosis not present

## 2017-01-12 DIAGNOSIS — K5790 Diverticulosis of intestine, part unspecified, without perforation or abscess without bleeding: Secondary | ICD-10-CM | POA: Diagnosis not present

## 2017-01-12 DIAGNOSIS — K219 Gastro-esophageal reflux disease without esophagitis: Secondary | ICD-10-CM | POA: Diagnosis not present

## 2017-01-12 DIAGNOSIS — M19012 Primary osteoarthritis, left shoulder: Secondary | ICD-10-CM | POA: Diagnosis not present

## 2017-01-12 DIAGNOSIS — R69 Illness, unspecified: Secondary | ICD-10-CM | POA: Diagnosis not present

## 2017-01-12 DIAGNOSIS — M19011 Primary osteoarthritis, right shoulder: Secondary | ICD-10-CM | POA: Diagnosis not present

## 2017-01-12 DIAGNOSIS — I10 Essential (primary) hypertension: Secondary | ICD-10-CM | POA: Diagnosis not present

## 2017-01-12 DIAGNOSIS — E785 Hyperlipidemia, unspecified: Secondary | ICD-10-CM | POA: Diagnosis not present

## 2017-01-12 DIAGNOSIS — D649 Anemia, unspecified: Secondary | ICD-10-CM | POA: Diagnosis not present

## 2017-01-12 DIAGNOSIS — M069 Rheumatoid arthritis, unspecified: Secondary | ICD-10-CM | POA: Diagnosis not present

## 2017-01-12 DIAGNOSIS — Z471 Aftercare following joint replacement surgery: Secondary | ICD-10-CM | POA: Diagnosis not present

## 2017-01-18 DIAGNOSIS — R69 Illness, unspecified: Secondary | ICD-10-CM | POA: Diagnosis not present

## 2017-01-18 DIAGNOSIS — K5790 Diverticulosis of intestine, part unspecified, without perforation or abscess without bleeding: Secondary | ICD-10-CM | POA: Diagnosis not present

## 2017-01-18 DIAGNOSIS — E785 Hyperlipidemia, unspecified: Secondary | ICD-10-CM | POA: Diagnosis not present

## 2017-01-18 DIAGNOSIS — Z471 Aftercare following joint replacement surgery: Secondary | ICD-10-CM | POA: Diagnosis not present

## 2017-01-18 DIAGNOSIS — K219 Gastro-esophageal reflux disease without esophagitis: Secondary | ICD-10-CM | POA: Diagnosis not present

## 2017-01-18 DIAGNOSIS — Z96612 Presence of left artificial shoulder joint: Secondary | ICD-10-CM | POA: Diagnosis not present

## 2017-01-18 DIAGNOSIS — I10 Essential (primary) hypertension: Secondary | ICD-10-CM | POA: Diagnosis not present

## 2017-01-18 DIAGNOSIS — M1990 Unspecified osteoarthritis, unspecified site: Secondary | ICD-10-CM | POA: Diagnosis not present

## 2017-01-18 DIAGNOSIS — D649 Anemia, unspecified: Secondary | ICD-10-CM | POA: Diagnosis not present

## 2017-01-18 DIAGNOSIS — M069 Rheumatoid arthritis, unspecified: Secondary | ICD-10-CM | POA: Diagnosis not present

## 2017-01-20 DIAGNOSIS — Z96612 Presence of left artificial shoulder joint: Secondary | ICD-10-CM | POA: Diagnosis not present

## 2017-01-20 DIAGNOSIS — K5790 Diverticulosis of intestine, part unspecified, without perforation or abscess without bleeding: Secondary | ICD-10-CM | POA: Diagnosis not present

## 2017-01-20 DIAGNOSIS — E785 Hyperlipidemia, unspecified: Secondary | ICD-10-CM | POA: Diagnosis not present

## 2017-01-20 DIAGNOSIS — M069 Rheumatoid arthritis, unspecified: Secondary | ICD-10-CM | POA: Diagnosis not present

## 2017-01-20 DIAGNOSIS — Z471 Aftercare following joint replacement surgery: Secondary | ICD-10-CM | POA: Diagnosis not present

## 2017-01-20 DIAGNOSIS — I10 Essential (primary) hypertension: Secondary | ICD-10-CM | POA: Diagnosis not present

## 2017-01-20 DIAGNOSIS — R69 Illness, unspecified: Secondary | ICD-10-CM | POA: Diagnosis not present

## 2017-01-20 DIAGNOSIS — K219 Gastro-esophageal reflux disease without esophagitis: Secondary | ICD-10-CM | POA: Diagnosis not present

## 2017-01-20 DIAGNOSIS — D649 Anemia, unspecified: Secondary | ICD-10-CM | POA: Diagnosis not present

## 2017-01-20 DIAGNOSIS — M1990 Unspecified osteoarthritis, unspecified site: Secondary | ICD-10-CM | POA: Diagnosis not present

## 2017-01-25 DIAGNOSIS — R69 Illness, unspecified: Secondary | ICD-10-CM | POA: Diagnosis not present

## 2017-01-25 DIAGNOSIS — M069 Rheumatoid arthritis, unspecified: Secondary | ICD-10-CM | POA: Diagnosis not present

## 2017-01-25 DIAGNOSIS — K5790 Diverticulosis of intestine, part unspecified, without perforation or abscess without bleeding: Secondary | ICD-10-CM | POA: Diagnosis not present

## 2017-01-25 DIAGNOSIS — M12512 Traumatic arthropathy, left shoulder: Secondary | ICD-10-CM | POA: Diagnosis not present

## 2017-01-25 DIAGNOSIS — K219 Gastro-esophageal reflux disease without esophagitis: Secondary | ICD-10-CM | POA: Diagnosis not present

## 2017-01-25 DIAGNOSIS — Z96612 Presence of left artificial shoulder joint: Secondary | ICD-10-CM | POA: Diagnosis not present

## 2017-01-25 DIAGNOSIS — D649 Anemia, unspecified: Secondary | ICD-10-CM | POA: Diagnosis not present

## 2017-01-25 DIAGNOSIS — I10 Essential (primary) hypertension: Secondary | ICD-10-CM | POA: Diagnosis not present

## 2017-01-25 DIAGNOSIS — E785 Hyperlipidemia, unspecified: Secondary | ICD-10-CM | POA: Diagnosis not present

## 2017-01-25 DIAGNOSIS — Z471 Aftercare following joint replacement surgery: Secondary | ICD-10-CM | POA: Diagnosis not present

## 2017-01-25 DIAGNOSIS — D849 Immunodeficiency, unspecified: Secondary | ICD-10-CM | POA: Diagnosis not present

## 2017-01-25 DIAGNOSIS — M1990 Unspecified osteoarthritis, unspecified site: Secondary | ICD-10-CM | POA: Diagnosis not present

## 2017-01-26 DIAGNOSIS — I10 Essential (primary) hypertension: Secondary | ICD-10-CM | POA: Diagnosis not present

## 2017-01-26 DIAGNOSIS — M069 Rheumatoid arthritis, unspecified: Secondary | ICD-10-CM | POA: Diagnosis not present

## 2017-01-26 DIAGNOSIS — K219 Gastro-esophageal reflux disease without esophagitis: Secondary | ICD-10-CM | POA: Diagnosis not present

## 2017-01-26 DIAGNOSIS — K5790 Diverticulosis of intestine, part unspecified, without perforation or abscess without bleeding: Secondary | ICD-10-CM | POA: Diagnosis not present

## 2017-01-26 DIAGNOSIS — R69 Illness, unspecified: Secondary | ICD-10-CM | POA: Diagnosis not present

## 2017-01-26 DIAGNOSIS — Z96612 Presence of left artificial shoulder joint: Secondary | ICD-10-CM | POA: Diagnosis not present

## 2017-01-26 DIAGNOSIS — E785 Hyperlipidemia, unspecified: Secondary | ICD-10-CM | POA: Diagnosis not present

## 2017-01-26 DIAGNOSIS — Z471 Aftercare following joint replacement surgery: Secondary | ICD-10-CM | POA: Diagnosis not present

## 2017-01-26 DIAGNOSIS — M1990 Unspecified osteoarthritis, unspecified site: Secondary | ICD-10-CM | POA: Diagnosis not present

## 2017-01-26 DIAGNOSIS — D649 Anemia, unspecified: Secondary | ICD-10-CM | POA: Diagnosis not present

## 2017-01-28 DIAGNOSIS — E785 Hyperlipidemia, unspecified: Secondary | ICD-10-CM | POA: Diagnosis not present

## 2017-01-28 DIAGNOSIS — Z471 Aftercare following joint replacement surgery: Secondary | ICD-10-CM | POA: Diagnosis not present

## 2017-01-28 DIAGNOSIS — M1991 Primary osteoarthritis, unspecified site: Secondary | ICD-10-CM | POA: Diagnosis not present

## 2017-01-28 DIAGNOSIS — K219 Gastro-esophageal reflux disease without esophagitis: Secondary | ICD-10-CM | POA: Diagnosis not present

## 2017-01-28 DIAGNOSIS — M069 Rheumatoid arthritis, unspecified: Secondary | ICD-10-CM | POA: Diagnosis not present

## 2017-01-28 DIAGNOSIS — R69 Illness, unspecified: Secondary | ICD-10-CM | POA: Diagnosis not present

## 2017-01-28 DIAGNOSIS — K5790 Diverticulosis of intestine, part unspecified, without perforation or abscess without bleeding: Secondary | ICD-10-CM | POA: Diagnosis not present

## 2017-01-28 DIAGNOSIS — Z96612 Presence of left artificial shoulder joint: Secondary | ICD-10-CM | POA: Diagnosis not present

## 2017-01-28 DIAGNOSIS — I10 Essential (primary) hypertension: Secondary | ICD-10-CM | POA: Diagnosis not present

## 2017-01-28 DIAGNOSIS — D649 Anemia, unspecified: Secondary | ICD-10-CM | POA: Diagnosis not present

## 2017-02-01 DIAGNOSIS — R69 Illness, unspecified: Secondary | ICD-10-CM | POA: Diagnosis not present

## 2017-02-01 DIAGNOSIS — I10 Essential (primary) hypertension: Secondary | ICD-10-CM | POA: Diagnosis not present

## 2017-02-01 DIAGNOSIS — K5790 Diverticulosis of intestine, part unspecified, without perforation or abscess without bleeding: Secondary | ICD-10-CM | POA: Diagnosis not present

## 2017-02-01 DIAGNOSIS — Z96612 Presence of left artificial shoulder joint: Secondary | ICD-10-CM | POA: Diagnosis not present

## 2017-02-01 DIAGNOSIS — M069 Rheumatoid arthritis, unspecified: Secondary | ICD-10-CM | POA: Diagnosis not present

## 2017-02-01 DIAGNOSIS — D649 Anemia, unspecified: Secondary | ICD-10-CM | POA: Diagnosis not present

## 2017-02-01 DIAGNOSIS — E785 Hyperlipidemia, unspecified: Secondary | ICD-10-CM | POA: Diagnosis not present

## 2017-02-01 DIAGNOSIS — K219 Gastro-esophageal reflux disease without esophagitis: Secondary | ICD-10-CM | POA: Diagnosis not present

## 2017-02-01 DIAGNOSIS — Z471 Aftercare following joint replacement surgery: Secondary | ICD-10-CM | POA: Diagnosis not present

## 2017-02-01 DIAGNOSIS — M1991 Primary osteoarthritis, unspecified site: Secondary | ICD-10-CM | POA: Diagnosis not present

## 2017-02-04 DIAGNOSIS — K5790 Diverticulosis of intestine, part unspecified, without perforation or abscess without bleeding: Secondary | ICD-10-CM | POA: Diagnosis not present

## 2017-02-04 DIAGNOSIS — E785 Hyperlipidemia, unspecified: Secondary | ICD-10-CM | POA: Diagnosis not present

## 2017-02-04 DIAGNOSIS — Z96612 Presence of left artificial shoulder joint: Secondary | ICD-10-CM | POA: Diagnosis not present

## 2017-02-04 DIAGNOSIS — M1991 Primary osteoarthritis, unspecified site: Secondary | ICD-10-CM | POA: Diagnosis not present

## 2017-02-04 DIAGNOSIS — K219 Gastro-esophageal reflux disease without esophagitis: Secondary | ICD-10-CM | POA: Diagnosis not present

## 2017-02-04 DIAGNOSIS — R69 Illness, unspecified: Secondary | ICD-10-CM | POA: Diagnosis not present

## 2017-02-04 DIAGNOSIS — D649 Anemia, unspecified: Secondary | ICD-10-CM | POA: Diagnosis not present

## 2017-02-04 DIAGNOSIS — I10 Essential (primary) hypertension: Secondary | ICD-10-CM | POA: Diagnosis not present

## 2017-02-04 DIAGNOSIS — Z471 Aftercare following joint replacement surgery: Secondary | ICD-10-CM | POA: Diagnosis not present

## 2017-02-04 DIAGNOSIS — M069 Rheumatoid arthritis, unspecified: Secondary | ICD-10-CM | POA: Diagnosis not present

## 2017-02-08 DIAGNOSIS — Z471 Aftercare following joint replacement surgery: Secondary | ICD-10-CM | POA: Diagnosis not present

## 2017-02-08 DIAGNOSIS — I10 Essential (primary) hypertension: Secondary | ICD-10-CM | POA: Diagnosis not present

## 2017-02-08 DIAGNOSIS — Z96612 Presence of left artificial shoulder joint: Secondary | ICD-10-CM | POA: Diagnosis not present

## 2017-02-08 DIAGNOSIS — K219 Gastro-esophageal reflux disease without esophagitis: Secondary | ICD-10-CM | POA: Diagnosis not present

## 2017-02-08 DIAGNOSIS — D649 Anemia, unspecified: Secondary | ICD-10-CM | POA: Diagnosis not present

## 2017-02-08 DIAGNOSIS — M1991 Primary osteoarthritis, unspecified site: Secondary | ICD-10-CM | POA: Diagnosis not present

## 2017-02-08 DIAGNOSIS — R69 Illness, unspecified: Secondary | ICD-10-CM | POA: Diagnosis not present

## 2017-02-08 DIAGNOSIS — E785 Hyperlipidemia, unspecified: Secondary | ICD-10-CM | POA: Diagnosis not present

## 2017-02-08 DIAGNOSIS — K5790 Diverticulosis of intestine, part unspecified, without perforation or abscess without bleeding: Secondary | ICD-10-CM | POA: Diagnosis not present

## 2017-02-08 DIAGNOSIS — M069 Rheumatoid arthritis, unspecified: Secondary | ICD-10-CM | POA: Diagnosis not present

## 2017-02-10 ENCOUNTER — Other Ambulatory Visit: Payer: Self-pay | Admitting: Cardiology

## 2017-02-10 DIAGNOSIS — M1991 Primary osteoarthritis, unspecified site: Secondary | ICD-10-CM | POA: Diagnosis not present

## 2017-02-10 DIAGNOSIS — I10 Essential (primary) hypertension: Secondary | ICD-10-CM | POA: Diagnosis not present

## 2017-02-10 DIAGNOSIS — R69 Illness, unspecified: Secondary | ICD-10-CM | POA: Diagnosis not present

## 2017-02-10 DIAGNOSIS — D649 Anemia, unspecified: Secondary | ICD-10-CM | POA: Diagnosis not present

## 2017-02-10 DIAGNOSIS — E785 Hyperlipidemia, unspecified: Secondary | ICD-10-CM | POA: Diagnosis not present

## 2017-02-10 DIAGNOSIS — Z471 Aftercare following joint replacement surgery: Secondary | ICD-10-CM | POA: Diagnosis not present

## 2017-02-10 DIAGNOSIS — K5790 Diverticulosis of intestine, part unspecified, without perforation or abscess without bleeding: Secondary | ICD-10-CM | POA: Diagnosis not present

## 2017-02-10 DIAGNOSIS — M069 Rheumatoid arthritis, unspecified: Secondary | ICD-10-CM | POA: Diagnosis not present

## 2017-02-10 DIAGNOSIS — K219 Gastro-esophageal reflux disease without esophagitis: Secondary | ICD-10-CM | POA: Diagnosis not present

## 2017-02-10 DIAGNOSIS — Z96612 Presence of left artificial shoulder joint: Secondary | ICD-10-CM | POA: Diagnosis not present

## 2017-02-14 DIAGNOSIS — M19011 Primary osteoarthritis, right shoulder: Secondary | ICD-10-CM | POA: Diagnosis not present

## 2017-02-15 DIAGNOSIS — I10 Essential (primary) hypertension: Secondary | ICD-10-CM | POA: Diagnosis not present

## 2017-02-15 DIAGNOSIS — M069 Rheumatoid arthritis, unspecified: Secondary | ICD-10-CM | POA: Diagnosis not present

## 2017-02-15 DIAGNOSIS — D649 Anemia, unspecified: Secondary | ICD-10-CM | POA: Diagnosis not present

## 2017-02-15 DIAGNOSIS — Z96612 Presence of left artificial shoulder joint: Secondary | ICD-10-CM | POA: Diagnosis not present

## 2017-02-15 DIAGNOSIS — R69 Illness, unspecified: Secondary | ICD-10-CM | POA: Diagnosis not present

## 2017-02-15 DIAGNOSIS — K219 Gastro-esophageal reflux disease without esophagitis: Secondary | ICD-10-CM | POA: Diagnosis not present

## 2017-02-15 DIAGNOSIS — Z471 Aftercare following joint replacement surgery: Secondary | ICD-10-CM | POA: Diagnosis not present

## 2017-02-15 DIAGNOSIS — E785 Hyperlipidemia, unspecified: Secondary | ICD-10-CM | POA: Diagnosis not present

## 2017-02-15 DIAGNOSIS — K5790 Diverticulosis of intestine, part unspecified, without perforation or abscess without bleeding: Secondary | ICD-10-CM | POA: Diagnosis not present

## 2017-02-15 DIAGNOSIS — M1991 Primary osteoarthritis, unspecified site: Secondary | ICD-10-CM | POA: Diagnosis not present

## 2017-02-17 DIAGNOSIS — I10 Essential (primary) hypertension: Secondary | ICD-10-CM | POA: Diagnosis not present

## 2017-02-17 DIAGNOSIS — K219 Gastro-esophageal reflux disease without esophagitis: Secondary | ICD-10-CM | POA: Diagnosis not present

## 2017-02-17 DIAGNOSIS — R69 Illness, unspecified: Secondary | ICD-10-CM | POA: Diagnosis not present

## 2017-02-17 DIAGNOSIS — M069 Rheumatoid arthritis, unspecified: Secondary | ICD-10-CM | POA: Diagnosis not present

## 2017-02-17 DIAGNOSIS — D649 Anemia, unspecified: Secondary | ICD-10-CM | POA: Diagnosis not present

## 2017-02-17 DIAGNOSIS — Z96612 Presence of left artificial shoulder joint: Secondary | ICD-10-CM | POA: Diagnosis not present

## 2017-02-17 DIAGNOSIS — Z471 Aftercare following joint replacement surgery: Secondary | ICD-10-CM | POA: Diagnosis not present

## 2017-02-17 DIAGNOSIS — M1991 Primary osteoarthritis, unspecified site: Secondary | ICD-10-CM | POA: Diagnosis not present

## 2017-02-17 DIAGNOSIS — E785 Hyperlipidemia, unspecified: Secondary | ICD-10-CM | POA: Diagnosis not present

## 2017-02-17 DIAGNOSIS — K5790 Diverticulosis of intestine, part unspecified, without perforation or abscess without bleeding: Secondary | ICD-10-CM | POA: Diagnosis not present

## 2017-02-22 DIAGNOSIS — M069 Rheumatoid arthritis, unspecified: Secondary | ICD-10-CM | POA: Diagnosis not present

## 2017-02-22 DIAGNOSIS — Z96612 Presence of left artificial shoulder joint: Secondary | ICD-10-CM | POA: Diagnosis not present

## 2017-02-22 DIAGNOSIS — K219 Gastro-esophageal reflux disease without esophagitis: Secondary | ICD-10-CM | POA: Diagnosis not present

## 2017-02-22 DIAGNOSIS — D649 Anemia, unspecified: Secondary | ICD-10-CM | POA: Diagnosis not present

## 2017-02-22 DIAGNOSIS — R69 Illness, unspecified: Secondary | ICD-10-CM | POA: Diagnosis not present

## 2017-02-22 DIAGNOSIS — I10 Essential (primary) hypertension: Secondary | ICD-10-CM | POA: Diagnosis not present

## 2017-02-22 DIAGNOSIS — K5790 Diverticulosis of intestine, part unspecified, without perforation or abscess without bleeding: Secondary | ICD-10-CM | POA: Diagnosis not present

## 2017-02-22 DIAGNOSIS — M1991 Primary osteoarthritis, unspecified site: Secondary | ICD-10-CM | POA: Diagnosis not present

## 2017-02-22 DIAGNOSIS — E785 Hyperlipidemia, unspecified: Secondary | ICD-10-CM | POA: Diagnosis not present

## 2017-02-22 DIAGNOSIS — Z471 Aftercare following joint replacement surgery: Secondary | ICD-10-CM | POA: Diagnosis not present

## 2017-02-24 DIAGNOSIS — D649 Anemia, unspecified: Secondary | ICD-10-CM | POA: Diagnosis not present

## 2017-02-24 DIAGNOSIS — Z471 Aftercare following joint replacement surgery: Secondary | ICD-10-CM | POA: Diagnosis not present

## 2017-02-24 DIAGNOSIS — M1991 Primary osteoarthritis, unspecified site: Secondary | ICD-10-CM | POA: Diagnosis not present

## 2017-02-24 DIAGNOSIS — M069 Rheumatoid arthritis, unspecified: Secondary | ICD-10-CM | POA: Diagnosis not present

## 2017-02-24 DIAGNOSIS — R69 Illness, unspecified: Secondary | ICD-10-CM | POA: Diagnosis not present

## 2017-02-24 DIAGNOSIS — K5790 Diverticulosis of intestine, part unspecified, without perforation or abscess without bleeding: Secondary | ICD-10-CM | POA: Diagnosis not present

## 2017-02-24 DIAGNOSIS — M12512 Traumatic arthropathy, left shoulder: Secondary | ICD-10-CM | POA: Diagnosis not present

## 2017-02-24 DIAGNOSIS — I10 Essential (primary) hypertension: Secondary | ICD-10-CM | POA: Diagnosis not present

## 2017-02-24 DIAGNOSIS — D849 Immunodeficiency, unspecified: Secondary | ICD-10-CM | POA: Diagnosis not present

## 2017-02-24 DIAGNOSIS — Z96612 Presence of left artificial shoulder joint: Secondary | ICD-10-CM | POA: Diagnosis not present

## 2017-02-24 DIAGNOSIS — E785 Hyperlipidemia, unspecified: Secondary | ICD-10-CM | POA: Diagnosis not present

## 2017-02-24 DIAGNOSIS — K219 Gastro-esophageal reflux disease without esophagitis: Secondary | ICD-10-CM | POA: Diagnosis not present

## 2017-03-01 DIAGNOSIS — I10 Essential (primary) hypertension: Secondary | ICD-10-CM | POA: Diagnosis not present

## 2017-03-01 DIAGNOSIS — Z96612 Presence of left artificial shoulder joint: Secondary | ICD-10-CM | POA: Diagnosis not present

## 2017-03-01 DIAGNOSIS — E785 Hyperlipidemia, unspecified: Secondary | ICD-10-CM | POA: Diagnosis not present

## 2017-03-01 DIAGNOSIS — K219 Gastro-esophageal reflux disease without esophagitis: Secondary | ICD-10-CM | POA: Diagnosis not present

## 2017-03-01 DIAGNOSIS — M1991 Primary osteoarthritis, unspecified site: Secondary | ICD-10-CM | POA: Diagnosis not present

## 2017-03-01 DIAGNOSIS — D649 Anemia, unspecified: Secondary | ICD-10-CM | POA: Diagnosis not present

## 2017-03-01 DIAGNOSIS — Z471 Aftercare following joint replacement surgery: Secondary | ICD-10-CM | POA: Diagnosis not present

## 2017-03-01 DIAGNOSIS — R69 Illness, unspecified: Secondary | ICD-10-CM | POA: Diagnosis not present

## 2017-03-01 DIAGNOSIS — K5790 Diverticulosis of intestine, part unspecified, without perforation or abscess without bleeding: Secondary | ICD-10-CM | POA: Diagnosis not present

## 2017-03-01 DIAGNOSIS — M069 Rheumatoid arthritis, unspecified: Secondary | ICD-10-CM | POA: Diagnosis not present

## 2017-03-10 DIAGNOSIS — Z9884 Bariatric surgery status: Secondary | ICD-10-CM | POA: Diagnosis not present

## 2017-03-10 DIAGNOSIS — M1990 Unspecified osteoarthritis, unspecified site: Secondary | ICD-10-CM | POA: Diagnosis not present

## 2017-03-10 DIAGNOSIS — I10 Essential (primary) hypertension: Secondary | ICD-10-CM | POA: Diagnosis not present

## 2017-03-10 DIAGNOSIS — E039 Hypothyroidism, unspecified: Secondary | ICD-10-CM | POA: Diagnosis not present

## 2017-03-24 NOTE — Addendum Note (Signed)
Addendum  created 03/24/17 1251 by Roberts Gaudy, MD   Sign clinical note

## 2017-03-27 DIAGNOSIS — D849 Immunodeficiency, unspecified: Secondary | ICD-10-CM | POA: Diagnosis not present

## 2017-03-27 DIAGNOSIS — M12512 Traumatic arthropathy, left shoulder: Secondary | ICD-10-CM | POA: Diagnosis not present

## 2017-04-27 DIAGNOSIS — D849 Immunodeficiency, unspecified: Secondary | ICD-10-CM | POA: Diagnosis not present

## 2017-04-27 DIAGNOSIS — M12512 Traumatic arthropathy, left shoulder: Secondary | ICD-10-CM | POA: Diagnosis not present

## 2017-05-06 DIAGNOSIS — R69 Illness, unspecified: Secondary | ICD-10-CM | POA: Diagnosis not present

## 2017-05-16 DIAGNOSIS — M19012 Primary osteoarthritis, left shoulder: Secondary | ICD-10-CM | POA: Diagnosis not present

## 2017-05-16 DIAGNOSIS — M25511 Pain in right shoulder: Secondary | ICD-10-CM | POA: Diagnosis not present

## 2017-05-19 ENCOUNTER — Encounter: Payer: Self-pay | Admitting: Cardiology

## 2017-05-19 ENCOUNTER — Ambulatory Visit (INDEPENDENT_AMBULATORY_CARE_PROVIDER_SITE_OTHER): Payer: Medicare HMO | Admitting: Cardiology

## 2017-05-19 VITALS — BP 116/78 | HR 60 | Ht 61.0 in | Wt 135.0 lb

## 2017-05-19 DIAGNOSIS — R600 Localized edema: Secondary | ICD-10-CM | POA: Diagnosis not present

## 2017-05-19 DIAGNOSIS — E785 Hyperlipidemia, unspecified: Secondary | ICD-10-CM

## 2017-05-19 DIAGNOSIS — Z79899 Other long term (current) drug therapy: Secondary | ICD-10-CM

## 2017-05-19 DIAGNOSIS — R002 Palpitations: Secondary | ICD-10-CM | POA: Diagnosis not present

## 2017-05-19 NOTE — Patient Instructions (Signed)
Medication Instructions:  Your physician recommends that you continue on your current medications as directed. Please refer to the Current Medication list given to you today.   Labwork: ASAP   Testing/Procedures: NONE  Follow-Up: Your physician wants you to follow-up in: 1 YEAR  You will receive a reminder letter in the mail two months in advance. If you don't receive a letter, please call our office to schedule the follow-up appointment.   Any Other Special Instructions Will Be Listed Below (If Applicable).     If you need a refill on your cardiac medications before your next appointment, please call your pharmacy.   

## 2017-05-19 NOTE — Progress Notes (Signed)
Clinical Summary Kathy [name] is a 76 y.o.female seen today for follow up of the following medical problems.   1. Leg edema - echo 11/2016 LVEF 55-60%, no WMAs, grade I diastolic dysfunction, PASP 20, mild RV dilatation - norvasc stopped by pcp recently - 10/2016 labs K 4.1, Cr 0.59   - better with compression stockings. Limiting sodium intake.  - takes lasix 40mg  daily, takes 20mg  as needed.    2. Palpitations - infrequent and short in duration    Past Medical History:  Diagnosis Date  . Anemia    FROM BLEEDING ULCER  . Anxiety    takes Alprazolam daily as needed  . Arthritis    dx with RA 2017  . Diverticulosis   . GERD (gastroesophageal reflux disease)   . Headache(784.0)    occasionally  . History of blood transfusion    no abnormal reaction noted  . History of bronchitis    last time many yrs ago  . Hyperlipidemia    PT DENIES THIS DX -  ON NO MEDS AND NO ONE HAS TOLD HER  . Hypertension    takes Amlodipine daily  . Hypothyroidism    takes Synthroid daily  . Insomnia   . Joint pain   . 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 Nocturia   . Numbness    occasionally left arm at night  . Osteoporosis    takes Fosamax weekly  . Peripheral edema    takes Lasix daily as needed  . PONV (postoperative nausea and vomiting)   . 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 Stomach ulcer      Allergies  Allergen Reactions  . Aspirin Other (See Comments)    Adult Aspirin: upset stomach / irritates ulcer      Current Outpatient Prescriptions  Medication Sig Dispense Refill  . acetaminophen (TYLENOL) 500 MG tablet Take 500 mg by mouth every 6 (six) hours as needed.    Marland Kitchen alendronate (FOSAMAX) 70 MG tablet Take 70 mg by mouth every Sunday. Take with a full glass of water on an empty stomach.    . ALPRAZolam (XANAX) 0.25 MG tablet Take 0.25 mg by mouth at bedtime as needed for anxiety or sleep.    Marland Kitchen aspirin EC 81 MG tablet Take 81 mg by mouth daily.     . calcium carbonate (OSCAL) 1500 (600 Ca) MG TABS tablet Take 1,500 mg by mouth daily with breakfast.    . Cholecalciferol 1000 UNITS TBDP Take 1,000 Units by mouth daily.     . diclofenac sodium (VOLTAREN) 1 % GEL Apply 2 g topically 2 (two) times daily as needed (for pain in knees and shoulder).    . furosemide (LASIX) 20 MG tablet May take an additional 20 mg daily (in addition to 40 mg daily dose) for leg swelling 90 tablet 3  . furosemide (LASIX) 40 MG tablet Take 40 mg by mouth daily.     . Multiple Vitamin (MULTIVITAMIN WITH MINERALS) TABS tablet Take 1 tablet by mouth daily.     . Omega-3 Fatty Acids (FISH OIL) 1000 MG CAPS Take 1,000 mg by mouth daily.    Marland Kitchen omeprazole (PRILOSEC) 40 MG capsule Take 40 mg by mouth daily.     . potassium chloride SA (K-DUR,KLOR-CON) 20 MEQ tablet TAKE 2 TABLETS DAILY. 60 tablet 6  . SYNTHROID 100 MCG tablet Take 100 mcg by mouth daily before breakfast.     . traMADol (ULTRAM) 50 MG tablet  Take 50 mg by mouth every 6 (six) hours as needed for moderate pain.     . traZODone (DESYREL) 50 MG tablet Take 50 mg by mouth at bedtime as needed.      No current facility-administered medications for this visit.      Past Surgical History:  Procedure Laterality Date  . ABDOMINAL HYSTERECTOMY    . cataract surgery Bilateral   . CHOLECYSTECTOMY    . COLONOSCOPY    . EYE SURGERY     CATARACTS BOTH  . gastric bypass surgery    . JOINT REPLACEMENT Bilateral    hip  . REVERSE SHOULDER ARTHROPLASTY Right 05/11/2016   Procedure: REVERSE SHOULDER ARTHROPLASTY;  Surgeon: Marchia Bond, MD;  Location: [facility];  Service: Orthopedics;  Laterality: Right;  . REVISION TOTAL HIP ARTHROPLASTY Left 10/03/2013   DR Mayer Camel  . TOTAL HIP REVISION Left 10/03/2013   Procedure: TOTAL HIP REVISION- left;  Surgeon: Kerin Salen, MD;  Location: [facility];  Service: Orthopedics;  Laterality: Left;  . TOTAL SHOULDER ARTHROPLASTY Left 11/09/2016   Procedure: TOTAL REVERSE SHOULDER  ARTHROPLASTY;  Surgeon: Marchia Bond, MD;  Location: [facility];  Service: Orthopedics;  Laterality: Left;  . TOTAL THYROIDECTOMY       Allergies  Allergen Reactions  . Aspirin Other (See Comments)    Adult Aspirin: upset stomach / irritates ulcer       Family History  Problem Relation Age of Onset  . Diabetes Mother   . Congestive Heart Failure Mother   . Lung cancer Father   . Thrombosis Sister   . CAD Brother        CABG     Social History Kathy [name] reports that she has never smoked. She has never used smokeless tobacco. Kathy [name] reports that she does not drink alcohol.   Review of Systems CONSTITUTIONAL: No weight loss, fever, chills, weakness or fatigue.  HEENT: Eyes: No visual loss, blurred vision, double vision or yellow sclerae.No hearing loss, sneezing, congestion, runny nose or sore throat.  SKIN: No rash or itching.  CARDIOVASCULAR: per hpi RESPIRATORY: No shortness of breath, cough or sputum.  GASTROINTESTINAL: No anorexia, nausea, vomiting or diarrhea. No abdominal pain or blood.  GENITOURINARY: No burning on urination, no polyuria NEUROLOGICAL: No headache, dizziness, syncope, paralysis, ataxia, numbness or tingling in the extremities. No change in bowel or bladder control.  MUSCULOSKELETAL: No muscle, back pain, joint pain or stiffness.  LYMPHATICS: No enlarged nodes. No history of splenectomy.  PSYCHIATRIC: No history of depression or anxiety.  ENDOCRINOLOGIC: No reports of sweating, cold or heat intolerance. No polyuria or polydipsia.  Marland Kitchen   Physical Examination Vitals:   05/19/17 1034  BP: 116/78  Pulse: 60  SpO2: 96%   Vitals:   05/19/17 1034  Weight: 135 lb (61.2 kg)  Height: 5\' 1"  (1.549 m)    Gen: resting comfortably, no acute distress HEENT: no scleral icterus, pupils equal round and reactive, no palptable cervical adenopathy,  CV: RRR, no m/rg, no jvd Resp: Clear to auscultation bilaterally GI: abdomen is soft, non-tender,  non-distended, normal bowel sounds, no hepatosplenomegaly MSK: extremities are warm, no edema.  Skin: warm, no rash Neuro:  no focal deficits Psych: appropriate affect   Diagnostic Studies 11/2016 echo Study Conclusions  - Left ventricle: The cavity size was normal. Wall thickness was normal. Systolic function was normal. The estimated ejection fraction was in the range of 55% to 60%. Wall motion was normal; there were no regional wall motion  abnormalities. Doppler parameters are consistent with abnormal left ventricular relaxation (grade 1 diastolic dysfunction). - Aortic valve: Mildly calcified annulus. Trileaflet. - Mitral valve: There was trivial regurgitation. - Right ventricle: The cavity size was mildly dilated. - Right atrium: The atrium was at the upper limits of normal in size. - Atrial septum: No defect or patent foramen ovale was identified. - Tricuspid valve: There was trivial regurgitation. - Pulmonary arteries: PA peak pressure: 20 mm Hg (S). - Pericardium, extracardiac: A small pericardial effusion was identified circumferential to the heart.  Impressions:  - Normal LV wall thickness with LVEF 55-60% and grade 1 diastolic dysfunction. Trivial mitral regurgitation. Mildly calcified aortic annulus. Mild dilated right ventricle and upper normal right atrial chamber size. Trivial tricuspid regurgitation with PASP estimated 20 mmHg. Small circumferential pericardial effusion.    Assessment and Plan   1. LE edema - echo with mild LV diastolic dysfunction, may be playing some role. Likely some component of venous insufficiency as well - continue compression stockings, low sodium diet, and diuretics  2. Palpitations - infrequent mild symptoms, we will conitnue to monitor at this time      Arnoldo Lenis, M.D

## 2017-05-23 ENCOUNTER — Other Ambulatory Visit (HOSPITAL_COMMUNITY)
Admission: RE | Admit: 2017-05-23 | Discharge: 2017-05-23 | Disposition: A | Discharge status: Home / Self Care | Payer: Medicare HMO | Source: Ambulatory Visit | Attending: Cardiology | Admitting: Cardiology

## 2017-05-23 DIAGNOSIS — R002 Palpitations: Secondary | ICD-10-CM | POA: Insufficient documentation

## 2017-05-23 DIAGNOSIS — Z79899 Other long term (current) drug therapy: Secondary | ICD-10-CM | POA: Diagnosis not present

## 2017-05-23 DIAGNOSIS — E785 Hyperlipidemia, unspecified: Secondary | ICD-10-CM | POA: Diagnosis not present

## 2017-05-23 LAB — CBC WITH DIFFERENTIAL/PLATELET
Basophils Absolute: 0 10*3/uL (ref 0.0–0.1)
Basophils Relative: 0 %
Eosinophils Absolute: 0.1 10*3/uL (ref 0.0–0.7)
Eosinophils Relative: 2 %
HCT: 38.6 % (ref 36.0–46.0)
Hemoglobin: 12.7 g/dL (ref 12.0–15.0)
Lymphocytes Relative: 54 %
Lymphs Abs: 2.1 10*3/uL (ref 0.7–4.0)
MCH: 31.2 pg (ref 26.0–34.0)
MCHC: 32.9 g/dL (ref 30.0–36.0)
MCV: 94.8 fL (ref 78.0–100.0)
Monocytes Absolute: 0.3 10*3/uL (ref 0.1–1.0)
Monocytes Relative: 8 %
Neutro Abs: 1.4 10*3/uL — ABNORMAL LOW (ref 1.7–7.7)
Neutrophils Relative %: 36 %
Platelets: 217 10*3/uL (ref 150–400)
RBC: 4.07 MIL/uL (ref 3.87–5.11)
RDW: 13.9 % (ref 11.5–15.5)
WBC: 3.8 10*3/uL — ABNORMAL LOW (ref 4.0–10.5)

## 2017-05-23 LAB — LIPID PANEL
Cholesterol: 145 mg/dL (ref 0–200)
HDL: 66 mg/dL (ref >40)
LDL Cholesterol: 70 mg/dL (ref 0–99)
Total CHOL/HDL Ratio: 2.2 RATIO
Triglycerides: 47 mg/dL (ref <150)
VLDL: 9 mg/dL (ref 0–40)

## 2017-05-23 LAB — COMPREHENSIVE METABOLIC PANEL
ALT: 17 U/L (ref 14–54)
AST: 23 U/L (ref 15–41)
Albumin: 3.5 g/dL (ref 3.5–5.0)
Alkaline Phosphatase: 69 U/L (ref 38–126)
Anion gap: 7 (ref 5–15)
BUN: 15 mg/dL (ref 6–20)
CO2: 29 mmol/L (ref 22–32)
Calcium: 9.3 mg/dL (ref 8.9–10.3)
Chloride: 102 mmol/L (ref 101–111)
Creatinine, Ser: 0.71 mg/dL (ref 0.44–1.00)
GFR calc Af Amer: >60 mL/min (ref >60)
GFR calc non Af Amer: >60 mL/min (ref >60)
Glucose, Bld: 90 mg/dL (ref 65–99)
Potassium: 3.9 mmol/L (ref 3.5–5.1)
Sodium: 138 mmol/L (ref 135–145)
Total Bilirubin: 0.6 mg/dL (ref 0.3–1.2)
Total Protein: 7.3 g/dL (ref 6.5–8.1)

## 2017-05-23 LAB — MAGNESIUM: Magnesium: 1.9 mg/dL (ref 1.7–2.4)

## 2017-05-23 LAB — TSH: TSH: 0.249 u[IU]/mL — ABNORMAL LOW (ref 0.350–4.500)

## 2017-05-25 LAB — HEMOGLOBIN A1C
Hgb A1c MFr Bld: 5.1 % (ref 4.8–5.6)
Mean Plasma Glucose: 100 mg/dL

## 2017-05-27 DIAGNOSIS — M12512 Traumatic arthropathy, left shoulder: Secondary | ICD-10-CM | POA: Diagnosis not present

## 2017-05-27 DIAGNOSIS — D849 Immunodeficiency, unspecified: Secondary | ICD-10-CM | POA: Diagnosis not present

## 2017-05-30 ENCOUNTER — Ambulatory Visit (INDEPENDENT_AMBULATORY_CARE_PROVIDER_SITE_OTHER): Payer: Medicare HMO | Admitting: Otolaryngology

## 2017-05-30 DIAGNOSIS — H6121 Impacted cerumen, right ear: Secondary | ICD-10-CM | POA: Diagnosis not present

## 2017-05-30 DIAGNOSIS — H903 Sensorineural hearing loss, bilateral: Secondary | ICD-10-CM

## 2017-06-01 ENCOUNTER — Telehealth: Payer: Self-pay | Admitting: Cardiology

## 2017-06-01 NOTE — Telephone Encounter (Signed)
Pt notified by Vilinda Blanks CMA

## 2017-06-01 NOTE — Telephone Encounter (Signed)
Lab results / tg  °

## 2017-06-09 ENCOUNTER — Other Ambulatory Visit: Payer: Self-pay

## 2017-06-09 ENCOUNTER — Emergency Department (HOSPITAL_COMMUNITY): Payer: Medicare HMO

## 2017-06-09 ENCOUNTER — Emergency Department (HOSPITAL_COMMUNITY)
Admission: EM | Admit: 2017-06-09 | Discharge: 2017-06-09 | Disposition: A | Discharge status: Home / Self Care | Payer: Medicare HMO | Attending: Emergency Medicine | Admitting: Emergency Medicine

## 2017-06-09 ENCOUNTER — Encounter (HOSPITAL_COMMUNITY): Payer: Self-pay | Admitting: *Deleted

## 2017-06-09 DIAGNOSIS — S0181XA Laceration without foreign body of other part of head, initial encounter: Secondary | ICD-10-CM | POA: Diagnosis not present

## 2017-06-09 DIAGNOSIS — Z96612 Presence of left artificial shoulder joint: Secondary | ICD-10-CM | POA: Insufficient documentation

## 2017-06-09 DIAGNOSIS — Y929 Unspecified place or not applicable: Secondary | ICD-10-CM | POA: Diagnosis not present

## 2017-06-09 DIAGNOSIS — Z7982 Long term (current) use of aspirin: Secondary | ICD-10-CM | POA: Diagnosis not present

## 2017-06-09 DIAGNOSIS — Z96643 Presence of artificial hip joint, bilateral: Secondary | ICD-10-CM | POA: Insufficient documentation

## 2017-06-09 DIAGNOSIS — I10 Essential (primary) hypertension: Secondary | ICD-10-CM | POA: Diagnosis not present

## 2017-06-09 DIAGNOSIS — S0993XA Unspecified injury of face, initial encounter: Secondary | ICD-10-CM | POA: Diagnosis not present

## 2017-06-09 DIAGNOSIS — S0182XA Laceration with foreign body of other part of head, initial encounter: Secondary | ICD-10-CM | POA: Diagnosis not present

## 2017-06-09 DIAGNOSIS — S01512A Laceration without foreign body of oral cavity, initial encounter: Secondary | ICD-10-CM | POA: Insufficient documentation

## 2017-06-09 DIAGNOSIS — W01198A Fall on same level from slipping, tripping and stumbling with subsequent striking against other object, initial encounter: Secondary | ICD-10-CM | POA: Insufficient documentation

## 2017-06-09 DIAGNOSIS — E039 Hypothyroidism, unspecified: Secondary | ICD-10-CM | POA: Insufficient documentation

## 2017-06-09 DIAGNOSIS — S0990XA Unspecified injury of head, initial encounter: Secondary | ICD-10-CM

## 2017-06-09 DIAGNOSIS — Y939 Activity, unspecified: Secondary | ICD-10-CM | POA: Insufficient documentation

## 2017-06-09 DIAGNOSIS — Z79899 Other long term (current) drug therapy: Secondary | ICD-10-CM | POA: Insufficient documentation

## 2017-06-09 DIAGNOSIS — S098XXA Other specified injuries of head, initial encounter: Secondary | ICD-10-CM | POA: Diagnosis not present

## 2017-06-09 DIAGNOSIS — Y999 Unspecified external cause status: Secondary | ICD-10-CM | POA: Diagnosis not present

## 2017-06-09 MED ORDER — ACETAMINOPHEN 325 MG PO TABS
650.0000 mg | ORAL_TABLET | Freq: Once | ORAL | Status: AC
Start: 2017-06-09 — End: 2017-06-09
  Administered 2017-06-09: 650 mg via ORAL
  Filled 2017-06-09: qty 2

## 2017-06-09 MED ORDER — LIDOCAINE HCL (PF) 2 % IJ SOLN
INTRAMUSCULAR | Status: AC
  Filled 2017-06-09: qty 10

## 2017-06-09 MED ORDER — AMOXICILLIN-POT CLAVULANATE 875-125 MG PO TABS
1.0000 | ORAL_TABLET | Freq: Once | ORAL | Status: AC
  Administered 2017-06-09: 1 via ORAL
  Filled 2017-06-09: qty 1

## 2017-06-09 MED ORDER — LIDOCAINE HCL (PF) 2 % IJ SOLN
INTRAMUSCULAR | Status: AC
  Administered 2017-06-09: 10 mL
  Filled 2017-06-09: qty 20

## 2017-06-09 MED ORDER — AMOXICILLIN-POT CLAVULANATE 875-125 MG PO TABS: 1.0000 | ORAL_TABLET | Freq: Two times a day (BID) | ORAL | 0 refills | Status: DC

## 2017-06-09 MED ORDER — LIDOCAINE-EPINEPHRINE-TETRACAINE (LET) SOLUTION
3.0000 mL | Freq: Once | NASAL | Status: AC
  Administered 2017-06-09: 3 mL via TOPICAL
  Filled 2017-06-09: qty 3

## 2017-06-09 MED ORDER — LIDOCAINE HCL (PF) 2 % IJ SOLN
10.0000 mL | Freq: Once | INTRAMUSCULAR | Status: AC
  Administered 2017-06-09: 10 mL

## 2017-06-09 NOTE — Discharge Instructions (Signed)
Take your next dose of the augmentin tomorrow morning.  You may use a warm salt water rinse to keep your mouth wound clean - several times daily and can apply a small dab of antibiotic ointment to your external lacerations twice daily if you wish. Have your stitches out in 5-6 days.

## 2017-06-09 NOTE — ED Provider Notes (Signed)
Medical screening examination/treatment/procedure(s) were conducted as a shared visit with non-physician practitioner(s) and myself.  I personally evaluated the patient during the encounter.   EKG Interpretation None       76 year old female with facial lacerations after mechanical fall.  Reassuring imaging.  Nonfocal neurological examination.  Lacerations were repaired by Almyra Free.  Continued wound care and head injury instructions were discussed.   Virgel Manifold, MD 06/09/17 [phone]

## 2017-06-09 NOTE — ED Triage Notes (Signed)
Pt fell onto her hardwood floors today after tripping over her feet causing laceration to chin and underneath chin. Pt denies any teeth feeling loose or LOC upon fall. Pt does report left jaw pain when opening her mouth. Bleeding controlled. Ice pack in place.

## 2017-06-15 ENCOUNTER — Encounter (HOSPITAL_COMMUNITY): Payer: Self-pay

## 2017-06-15 ENCOUNTER — Emergency Department (HOSPITAL_COMMUNITY)
Admission: EM | Admit: 2017-06-15 | Discharge: 2017-06-15 | Disposition: A | Discharge status: Home / Self Care | Payer: Medicare HMO | Attending: Emergency Medicine | Admitting: Emergency Medicine

## 2017-06-15 DIAGNOSIS — I10 Essential (primary) hypertension: Secondary | ICD-10-CM | POA: Insufficient documentation

## 2017-06-15 DIAGNOSIS — E039 Hypothyroidism, unspecified: Secondary | ICD-10-CM | POA: Diagnosis not present

## 2017-06-15 DIAGNOSIS — Z79899 Other long term (current) drug therapy: Secondary | ICD-10-CM | POA: Diagnosis not present

## 2017-06-15 DIAGNOSIS — Z4802 Encounter for removal of sutures: Secondary | ICD-10-CM

## 2017-06-15 DIAGNOSIS — S0181XD Laceration without foreign body of other part of head, subsequent encounter: Secondary | ICD-10-CM | POA: Diagnosis not present

## 2017-06-15 DIAGNOSIS — W19XXXD Unspecified fall, subsequent encounter: Secondary | ICD-10-CM | POA: Insufficient documentation

## 2017-06-15 DIAGNOSIS — Z96643 Presence of artificial hip joint, bilateral: Secondary | ICD-10-CM | POA: Insufficient documentation

## 2017-06-15 DIAGNOSIS — Z96612 Presence of left artificial shoulder joint: Secondary | ICD-10-CM | POA: Insufficient documentation

## 2017-06-15 DIAGNOSIS — Z4800 Encounter for change or removal of nonsurgical wound dressing: Secondary | ICD-10-CM | POA: Diagnosis not present

## 2017-06-15 NOTE — ED Triage Notes (Signed)
Pt is here to remove stiches from chin

## 2017-06-15 NOTE — ED Provider Notes (Signed)
[facility] EMERGENCY DEPARTMENT Provider Note   CSN: [id number] Arrival date & time: 06/15/17  1213     History   Chief Complaint Chief Complaint  Patient presents with  . Suture / Staple Removal    HPI Kathy [name] is a 76 y.o. female.  HPI   Kathy [name] is a 76 y.o. female who presents to the Emergency Department requesting suture removal.  She was seen here on 06/09/2017 after a fall in which she suffered a laceration to her chin and lower lip.  She is requesting removal of the external sutures.  She denies any complications including swelling, redness, drainage, or increasing pain.  Past Medical History:  Diagnosis Date  . Anemia    FROM BLEEDING ULCER  . Anxiety    takes Alprazolam daily as needed  . Arthritis    dx with RA 2017  . Diverticulosis   . GERD (gastroesophageal reflux disease)   . Headache(784.0)    occasionally  . History of blood transfusion    no abnormal reaction noted  . History of bronchitis    last time many yrs ago  . Hyperlipidemia    PT DENIES THIS DX -  ON NO MEDS AND NO ONE HAS TOLD HER  . Hypertension    takes Amlodipine daily  . Hypothyroidism    takes Synthroid daily  . Insomnia   . Joint pain   . 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 Nocturia   . Numbness    occasionally left arm at night  . Osteoporosis    takes Fosamax weekly  . Peripheral edema    takes Lasix daily as needed  . PONV (postoperative nausea and vomiting)   . 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 Stomach ulcer     Patient Active Problem List   Diagnosis Date Noted  . RA (rheumatoid arthritis) ([place]) 11/16/2016  . 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 Allergic rhinitis 10/19/2013  . GERD (gastroesophageal reflux disease) 10/19/2013  . Anemia 10/19/2013  . Osteoporosis   . Hypertension   . Hypothyroid   . Hyperlipidemia   . Anxiety    . 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 ABDOMINAL HYSTERECTOMY    . cataract surgery Bilateral   . CHOLECYSTECTOMY    . COLONOSCOPY    . EYE SURGERY     CATARACTS BOTH  . gastric bypass surgery    . JOINT REPLACEMENT Bilateral    hip  . REVISION TOTAL HIP ARTHROPLASTY Left 10/03/2013   DR Mayer Camel  . TOTAL SHOULDER REPLACEMENT Left 10/2016  . TOTAL THYROIDECTOMY      OB History    No data available       Home Medications    Prior to Admission medications   Medication Sig Start Date End Date Taking? Authorizing Provider  acetaminophen (TYLENOL) 500 MG tablet Take 500 mg by mouth every 6 (six) hours as needed.    [provider]  alendronate (FOSAMAX) 70 MG tablet Take 70 mg by mouth every Sunday. Take with a full glass of water on an empty stomach.    [provider]  ALPRAZolam Duanne Moron) 0.25 MG tablet Take 0.25 mg by mouth at bedtime as needed for anxiety or sleep.    [provider]  amoxicillin-clavulanate (AUGMENTIN) 875-125 MG tablet Take 1 tablet every  12 (twelve) hours by mouth. 06/09/17   Evalee Jefferson, PA-C  aspirin EC 81 MG tablet Take 81 mg by mouth daily.    [provider]  calcium carbonate (OSCAL) 1500 (600 Ca) MG TABS tablet Take 1,500 mg by mouth daily with breakfast.    [provider]  Cholecalciferol 1000 UNITS TBDP Take 1,000 Units by mouth daily.     [provider]  diclofenac sodium (VOLTAREN) 1 % GEL Apply 2 g topically 2 (two) times daily as needed (for pain in knees and shoulder).    [provider]  furosemide (LASIX) 20 MG tablet May take an additional 20 mg daily (in addition to 40 mg daily dose) for leg swelling 12/23/16   Arnoldo Lenis, MD  furosemide (LASIX) 40 MG tablet Take 40 mg by mouth daily.     [provider]  Multiple Vitamin (MULTIVITAMIN WITH MINERALS) TABS tablet Take 1 tablet by mouth daily.     [provider]  Omega-3 Fatty Acids (FISH OIL) 1000 MG CAPS Take 1,000 mg by mouth daily.    [provider]  potassium chloride SA (K-DUR,KLOR-CON) 20 MEQ tablet TAKE 2 TABLETS DAILY. 02/10/17   Arnoldo Lenis, MD  SYNTHROID 100 MCG tablet Take 100 mcg by mouth daily before breakfast.  03/08/16   [provider]  traMADol (ULTRAM) 50 MG tablet Take 50 mg by mouth every 6 (six) hours as needed for moderate pain.     [provider]  traZODone (DESYREL) 50 MG tablet Take 50 mg by mouth at bedtime as needed.     [provider]    Family History Family History  Problem Relation Age of Onset  . Diabetes Mother   . Congestive Heart Failure Mother   . Lung cancer Father   . Thrombosis Sister   . CAD Brother        CABG    Social History Social History   Tobacco Use  . Smoking status: Never Smoker  . Smokeless tobacco: Never Used  Substance Use Topics  . Alcohol use: No  . Drug use: No     Allergies   Aspirin   Review of Systems Review of Systems  Constitutional: Negative for chills and fever.  HENT: Negative for dental problem, facial swelling and mouth sores.   Eyes: Negative for visual disturbance.  Musculoskeletal: Negative for arthralgias, back pain and neck pain.  Skin: Positive for wound. Negative for color change.       Lacerations to the lower lip and chin with sutures in place  Neurological: Negative for dizziness, weakness, numbness and headaches.  Hematological: Does not bruise/bleed easily.  All other systems reviewed and are negative.    Physical Exam Updated Vital Signs BP 116/71 (BP Location: Right Arm)   Pulse 64   Temp 98.5 F (36.9 C) (Oral)   Resp 15   Ht 5\' 1"  (1.549 m)   Wt 61.2 kg (135 lb)   SpO2 100%   BMI 25.51 kg/m   Physical Exam  Constitutional: She is oriented to person, place, and time. She appears well-developed and well-nourished. No distress.  HENT:  Head: Normocephalic.  Mouth/Throat:  Uvula is midline, oropharynx is clear and moist and mucous membranes are normal. No trismus in the jaw.  Well healing lacerations of the mid lower lip and chin.  Sutures in place.  No wound dehiscence.  No erythema or drainage.  Neck: Normal range of motion.  Cardiovascular: Normal rate, regular  rhythm and intact distal pulses.  Pulmonary/Chest: Effort normal and breath sounds normal. No respiratory distress.  Musculoskeletal: Normal range of motion. She exhibits no edema or tenderness.  Neurological: She is alert and oriented to person, place, and time. She exhibits normal muscle tone. Coordination normal.  Skin: Skin is warm. No laceration noted.  Nursing note and vitals reviewed.    ED Treatments / Results  Labs (all labs ordered are listed, but only abnormal results are displayed) Labs Reviewed - No data to display  EKG  EKG Interpretation None       Radiology No results found.  Procedures Procedures (including critical care time)  Medications Ordered in ED Medications - No data to display   Initial Impression / Assessment and Plan / ED Course  I have reviewed the triage vital signs and the nursing notes.  Pertinent labs & imaging results that were available during my care of the patient were reviewed by me and considered in my medical decision making (see chart for details).     External sutures were removed by nursing staff without difficulty.  Suture line appears intact.  Wounds appear to be healing well.  Final Clinical Impressions(s) / ED Diagnoses   Final diagnoses:  Visit for suture removal    ED Discharge Orders    None       Kem Parkinson, PA-C 06/15/17 [place], [place], DO 06/17/17 1422

## 2017-06-15 NOTE — ED Provider Notes (Signed)
[facility] EMERGENCY DEPARTMENT Provider Note   CSN: [id number] Arrival date & time: 06/09/17  1229     History   Chief Complaint Chief Complaint  Patient presents with  . Facial Laceration    HPI Kathy [name] is a 76 y.o. female.  The history is provided by the patient.  Laceration   The incident occurred less than 1 hour ago. The laceration is located on the face (chin and interior lower lip). The laceration is 2 cm (2 cm inferior chin, 1 cm lower anterior chin exterior not involving lip border, 1 cm mucosa of lower lip.) in size. Injury mechanism: Pt tripped and fell in her home, hitting her chin against her tile floor.  she denies headache, loc, dizziness, focal weakness. The pain is at a severity of 3/10. The pain is mild. The pain has been constant since onset. It is unknown if a foreign body is present. Her tetanus status is UTD.    Past Medical History:  Diagnosis Date  . Anemia    FROM BLEEDING ULCER  . Anxiety    takes Alprazolam daily as needed  . Arthritis    dx with RA 2017  . Diverticulosis   . GERD (gastroesophageal reflux disease)   . Headache(784.0)    occasionally  . History of blood transfusion    no abnormal reaction noted  . History of bronchitis    last time many yrs ago  . Hyperlipidemia    PT DENIES THIS DX -  ON NO MEDS AND NO ONE HAS TOLD HER  . Hypertension    takes Amlodipine daily  . Hypothyroidism    takes Synthroid daily  . Insomnia   . Joint pain   . 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 Nocturia   . Numbness    occasionally left arm at night  . Osteoporosis    takes Fosamax weekly  . Peripheral edema    takes Lasix daily as needed  . PONV (postoperative nausea and vomiting)   . 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 Stomach ulcer     Patient Active Problem List   Diagnosis Date Noted  . RA (rheumatoid arthritis) ([place]) 11/16/2016  . 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 Rotator cuff  arthropathy, 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  . 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 Allergic rhinitis 10/19/2013  . GERD (gastroesophageal reflux disease) 10/19/2013  . Anemia 10/19/2013  . Osteoporosis   . Hypertension   . Hypothyroid   . Hyperlipidemia   . Anxiety   . 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 ABDOMINAL HYSTERECTOMY    . cataract surgery Bilateral   . CHOLECYSTECTOMY    . COLONOSCOPY    . EYE SURGERY     CATARACTS BOTH  . gastric bypass surgery    . JOINT REPLACEMENT Bilateral    hip  . REVISION TOTAL HIP ARTHROPLASTY Left 10/03/2013   DR Mayer Camel  . TOTAL SHOULDER REPLACEMENT Left 10/2016  . TOTAL THYROIDECTOMY      OB History    No data available       Home Medications    Prior to Admission medications   Medication Sig Start Date End Date Taking? Authorizing Provider  acetaminophen (TYLENOL) 500 MG tablet Take 500 mg by mouth every 6 (six) hours as needed.    [provider]  alendronate (FOSAMAX) 70 MG tablet Take 70 mg by mouth every Sunday.  Take with a full glass of water on an empty stomach.    [provider]  ALPRAZolam Duanne Moron) 0.25 MG tablet Take 0.25 mg by mouth at bedtime as needed for anxiety or sleep.    [provider]  amoxicillin-clavulanate (AUGMENTIN) 875-125 MG tablet Take 1 tablet every 12 (twelve) hours by mouth. 06/09/17   Evalee Jefferson, PA-C  aspirin EC 81 MG tablet Take 81 mg by mouth daily.    [provider]  calcium carbonate (OSCAL) 1500 (600 Ca) MG TABS tablet Take 1,500 mg by mouth daily with breakfast.    [provider]  Cholecalciferol 1000 UNITS TBDP Take 1,000 Units by mouth daily.     [provider]  diclofenac sodium (VOLTAREN) 1 % GEL Apply 2 g topically 2 (two) times daily as needed (for pain in knees and shoulder).    [provider]  furosemide (LASIX) 20 MG tablet May take an additional 20 mg daily (in addition to  40 mg daily dose) for leg swelling 12/23/16   Arnoldo Lenis, MD  furosemide (LASIX) 40 MG tablet Take 40 mg by mouth daily.     [provider]  Multiple Vitamin (MULTIVITAMIN WITH MINERALS) TABS tablet Take 1 tablet by mouth daily.     [provider]  Omega-3 Fatty Acids (FISH OIL) 1000 MG CAPS Take 1,000 mg by mouth daily.    [provider]  potassium chloride SA (K-DUR,KLOR-CON) 20 MEQ tablet TAKE 2 TABLETS DAILY. 02/10/17   Arnoldo Lenis, MD  SYNTHROID 100 MCG tablet Take 100 mcg by mouth daily before breakfast.  03/08/16   [provider]  traMADol (ULTRAM) 50 MG tablet Take 50 mg by mouth every 6 (six) hours as needed for moderate pain.     [provider]  traZODone (DESYREL) 50 MG tablet Take 50 mg by mouth at bedtime as needed.     [provider]    Family History Family History  Problem Relation Age of Onset  . Diabetes Mother   . Congestive Heart Failure Mother   . Lung cancer Father   . Thrombosis Sister   . CAD Brother        CABG    Social History Social History   Tobacco Use  . Smoking status: Never Smoker  . Smokeless tobacco: Never Used  Substance Use Topics  . Alcohol use: No  . Drug use: No     Allergies   Aspirin   Review of Systems Review of Systems  Constitutional: Negative for chills and fever.  Respiratory: Negative for shortness of breath and wheezing.   Cardiovascular: Negative.   Gastrointestinal: Negative.  Negative for nausea.  Skin: Positive for wound.  Neurological: Negative for dizziness, syncope, numbness and headaches.     Physical Exam Updated Vital Signs BP (!) 151/81   Pulse 72   Temp 98.4 F (36.9 C) (Oral)   Resp 16   Ht 5\' 1"  (1.549 m)   SpO2 99%   BMI 25.51 kg/m   Physical Exam  Constitutional: She is oriented to person, place, and time. She appears well-developed and well-nourished.  HENT:  Head: Normocephalic.  Right Ear: No hemotympanum.  Left  Ear: No hemotympanum.  Mouth/Throat: Normal dentition.    Anterior chin laceration, hemostatic, through and through involving lower mouth mucosa. Wood foreign body splinters present.   Mucosal wound is linear, horizontal and gaping at the lower dentition edge. Dentition intact. No bite malocclusion.  Eyes: Conjunctivae and  EOM are normal. Pupils are equal, round, and reactive to light.  Neck: Normal range of motion. Neck supple. No spinous process tenderness present.    Subcutaneous laceration inferior chin, 2 cm, hemostatic, no deep structures visualized. No fb present.  Cardiovascular: Normal rate, regular rhythm, normal heart sounds and intact distal pulses.  Pulmonary/Chest: Effort normal and breath sounds normal. She has no wheezes.  Abdominal: Soft. Bowel sounds are normal. There is no tenderness.  Musculoskeletal: Normal range of motion.  Neurological: She is alert and oriented to person, place, and time. She has normal strength. No cranial nerve deficit or sensory deficit. Gait normal.  Skin: Skin is warm and dry.  Psychiatric: She has a normal mood and affect.  Nursing note and vitals reviewed.    ED Treatments / Results  Labs (all labs ordered are listed, but only abnormal results are displayed) Labs Reviewed - No data to display  EKG  EKG Interpretation None       Radiology Results for orders placed or performed during the hospital encounter of 05/23/17  CBC with Differential/Platelet  Result Value Ref Range   WBC 3.8 (L) 4.0 - 10.5 K/uL   RBC 4.07 3.87 - 5.11 MIL/uL   Hemoglobin 12.7 12.0 - 15.0 g/dL   HCT 38.6 36.0 - 46.0 %   MCV 94.8 78.0 - 100.0 fL   MCH 31.2 26.0 - 34.0 pg   MCHC 32.9 30.0 - 36.0 g/dL   RDW 13.9 11.5 - 15.5 %   Platelets 217 150 - 400 K/uL   Neutrophils Relative % 36 %   Neutro Abs 1.4 (L) 1.7 - 7.7 K/uL   Lymphocytes Relative 54 %   Lymphs Abs 2.1 0.7 - 4.0 K/uL   Monocytes Relative 8 %   Monocytes Absolute 0.3 0.1 - 1.0 K/uL    Eosinophils Relative 2 %   Eosinophils Absolute 0.1 0.0 - 0.7 K/uL   Basophils Relative 0 %   Basophils Absolute 0.0 0.0 - 0.1 K/uL  TSH  Result Value Ref Range   TSH 0.249 (L) 0.350 - 4.500 uIU/mL  Lipid panel  Result Value Ref Range   Cholesterol 145 0 - 200 mg/dL   Triglycerides 47 <150 mg/dL   HDL 66 >40 mg/dL   Total CHOL/HDL Ratio 2.2 RATIO   VLDL 9 0 - 40 mg/dL   LDL Cholesterol 70 0 - 99 mg/dL  Comprehensive metabolic panel  Result Value Ref Range   Sodium 138 135 - 145 mmol/L   Potassium 3.9 3.5 - 5.1 mmol/L   Chloride 102 101 - 111 mmol/L   CO2 29 22 - 32 mmol/L   Glucose, Bld 90 65 - 99 mg/dL   BUN 15 6 - 20 mg/dL   Creatinine, Ser 0.71 0.44 - 1.00 mg/dL   Calcium 9.3 8.9 - 10.3 mg/dL   Total Protein 7.3 6.5 - 8.1 g/dL   Albumin 3.5 3.5 - 5.0 g/dL   AST 23 15 - 41 U/L   ALT 17 14 - 54 U/L   Alkaline Phosphatase 69 38 - 126 U/L   Total Bilirubin 0.6 0.3 - 1.2 mg/dL   GFR calc non Af Amer >60 >60 mL/min   GFR calc Af Amer >60 >60 mL/min   Anion gap 7 5 - 15  Hemoglobin A1c  Result Value Ref Range   Hgb A1c MFr Bld 5.1 4.8 - 5.6 %   Mean Plasma Glucose 100 mg/dL  Magnesium  Result Value Ref Range   Magnesium 1.9  1.7 - 2.4 mg/dL   Ct Head Wo Contrast  Result Date: 06/09/2017 CLINICAL DATA:  [place]. Facial trauma. Possible fracture. LEFT shoulder pain. EXAM: CT HEAD WITHOUT CONTRAST CT MAXILLOFACIAL WITHOUT CONTRAST TECHNIQUE: Multidetector CT imaging of the head and maxillofacial structures were performed using the standard protocol without intravenous contrast. Multiplanar CT image reconstructions of the maxillofacial structures were also generated. COMPARISON:  None. FINDINGS: CT HEAD FINDINGS Brain: No evidence for acute infarction, hemorrhage, mass lesion, hydrocephalus, or extra-axial fluid. Generalized atrophy. Hypoattenuation of white matter of a mild nature, probable small vessel disease. Vascular: Calcification of the cavernous internal carotid arteries  consistent with cerebrovascular atherosclerotic disease. No signs of intracranial large vessel occlusion. Skull: Normal. Negative for fracture or focal lesion. Other:    None. CT MAXILLOFACIAL FINDINGS Osseous: No facial fracture or mandibular dislocation. Extensive dental amalgam obscures portions of the face and jaw. There is a prominent periapical lucency surrounding the LEFT mandibular first premolar, series 7, image 72 consistent with periodontal disease. Adjacent mandibular lucency represents a venous [place].This however is a nonacute finding. Zygoma, and nasal bones intact. Orbits: BILATERAL cataract extraction. No traumatic or inflammatory finding. Sinuses: No significant opacity or layering fluid. No evidence for blowout injury. Soft tissues: There is marked soft tissue swelling involving the lower jaw. There is a small bubble of air heterogeneous soft tissue suggesting a laceration just below the symphysis. No radiopaque foreign body is seen. IMPRESSION: No skull fracture or intracranial hemorrhage. Chronic changes as described. No facial fracture, or mandibular dislocation. Extensive soft tissue swelling over the chin. 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 Electronically Signed   By: Staci Righter M.D.   On: 06/09/2017 16:14   Ct Maxillofacial Wo Contrast  Result Date: 06/09/2017 CLINICAL DATA:  [place]. Facial trauma. Possible fracture. LEFT shoulder pain. EXAM: CT HEAD WITHOUT CONTRAST CT MAXILLOFACIAL WITHOUT CONTRAST TECHNIQUE: Multidetector CT imaging of the head and maxillofacial structures were performed using the standard protocol without intravenous contrast. Multiplanar CT image reconstructions of the maxillofacial structures were also generated. COMPARISON:  None. FINDINGS: CT HEAD FINDINGS Brain: No evidence for acute infarction, hemorrhage, mass lesion, hydrocephalus, or extra-axial fluid. Generalized  atrophy. Hypoattenuation of white matter of a mild nature, probable small vessel disease. Vascular: Calcification of the cavernous internal carotid arteries consistent with cerebrovascular atherosclerotic disease. No signs of intracranial large vessel occlusion. Skull: Normal. Negative for fracture or focal lesion. Other:    None. CT MAXILLOFACIAL FINDINGS Osseous: No facial fracture or mandibular dislocation. Extensive dental amalgam obscures portions of the face and jaw. There is a prominent periapical lucency surrounding the LEFT mandibular first premolar, series 7, image 72 consistent with periodontal disease. Adjacent mandibular lucency represents a venous [place].This however is a nonacute finding. Zygoma, and nasal bones intact. Orbits: BILATERAL cataract extraction. No traumatic or inflammatory finding. Sinuses: No significant opacity or layering fluid. No evidence for blowout injury. Soft tissues: There is marked soft tissue swelling involving the lower jaw. There is a small bubble of air heterogeneous soft tissue suggesting a laceration just below the symphysis. No radiopaque foreign body is seen. IMPRESSION: No skull fracture or intracranial hemorrhage. Chronic changes as described. No facial fracture, or mandibular dislocation. Extensive soft tissue swelling over the chin. 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 Electronically Signed   By: Staci Righter M.D.   On: 06/09/2017 16:14  Procedures Procedures (including critical care time)  LACERATION REPAIRs Performed by: Querida Beretta Authorized by: Evalee Jefferson Consent: Verbal consent obtained. Risks and benefits: risks, benefits and alternatives were discussed Consent given by: patient Patient identity confirmed: provided demographic data Prepped and Draped in normal sterile fashion Wound explored  Laceration Location: chin and lip  Laceration Length: 2  cm inferior chin, 1 cm anterior chin, 1 cm mouth mucosa  Wood foreign bodies/splinters retrieved from anterior lip wound.   Anesthesia: local infiltration  Local anesthetic: LET solution followed by lidocaine 1% without epinephrine  Anesthetic total: 3 cc LET, 1 ml injected  Irrigation method: syringe Amount of cleaning: standard  Skin closure: 6-0 ethilon on anterior lacs, vicryl rapide 5-0 x 2 simple interrupted on mucosa, loosely approximated and 1 subcutaneous suture also using vicryl rapide to better approximate the inferior chin wound.   Number of sutures: #3 vicryl rapide.  #7 ethilon  Technique: externals - simple interupted.  Patient tolerance: Patient tolerated the procedure well with no immediate complications.   Medications Ordered in ED Medications  acetaminophen (TYLENOL) tablet 650 mg (650 mg Oral Given 06/09/17 1400)  lidocaine (XYLOCAINE) 2 % injection 10 mL (10 mLs Other Given 06/09/17 1447)  lidocaine-EPINEPHrine-tetracaine (LET) solution (3 mLs Topical Given 06/09/17 1447)  amoxicillin-clavulanate (AUGMENTIN) 875-125 MG per tablet 1 tablet (1 tablet Oral Given 06/09/17 1733)     Initial Impression / Assessment and Plan / ED Course  I have reviewed the triage vital signs and the nursing notes.  Pertinent labs & imaging results that were available during my care of the patient were reviewed by me and considered in my medical decision making (see chart for details).     No worsened pain, no development of n/v/d while in dept. CT imaging reviewed with no intracranial or facial bony trauma. Dressings applied.  Wound care instructions given.  Pt advised to have sutures removed in 5-7 days,  Return here sooner for any signs of infection including redness, swelling, worse pain or drainage of pus.  Pt was seen by Dr. Wilson Singer during this visit.   Final Clinical Impressions(s) / ED Diagnoses   Final diagnoses:  Injury of head, initial encounter  Chin laceration,  initial encounter  Laceration of internal mouth, initial encounter    ED Discharge Orders        Ordered    amoxicillin-clavulanate (AUGMENTIN) 875-125 MG tablet  Every 12 hours     06/09/17 1735       Evalee Jefferson, PA-C 06/15/17 1349    Virgel Manifold, MD 06/17/17 1336

## 2017-06-15 NOTE — Discharge Instructions (Signed)
Clean the area gently with mild soap and water and you can apply a small amt of neosporin daily until it heal completely.

## 2017-06-27 DIAGNOSIS — M12512 Traumatic arthropathy, left shoulder: Secondary | ICD-10-CM | POA: Diagnosis not present

## 2017-06-27 DIAGNOSIS — D849 Immunodeficiency, unspecified: Secondary | ICD-10-CM | POA: Diagnosis not present

## 2017-07-07 DIAGNOSIS — E890 Postprocedural hypothyroidism: Secondary | ICD-10-CM | POA: Diagnosis not present

## 2017-07-07 DIAGNOSIS — M810 Age-related osteoporosis without current pathological fracture: Secondary | ICD-10-CM | POA: Diagnosis not present

## 2017-07-07 DIAGNOSIS — C73 Malignant neoplasm of thyroid gland: Secondary | ICD-10-CM | POA: Diagnosis not present

## 2017-07-07 DIAGNOSIS — M899 Disorder of bone, unspecified: Secondary | ICD-10-CM | POA: Diagnosis not present

## 2017-07-07 DIAGNOSIS — M859 Disorder of bone density and structure, unspecified: Secondary | ICD-10-CM | POA: Diagnosis not present

## 2017-07-07 DIAGNOSIS — E892 Postprocedural hypoparathyroidism: Secondary | ICD-10-CM | POA: Diagnosis not present

## 2017-07-27 DIAGNOSIS — M12512 Traumatic arthropathy, left shoulder: Secondary | ICD-10-CM | POA: Diagnosis not present

## 2017-07-27 DIAGNOSIS — D849 Immunodeficiency, unspecified: Secondary | ICD-10-CM | POA: Diagnosis not present

## 2017-08-08 DIAGNOSIS — M1712 Unilateral primary osteoarthritis, left knee: Secondary | ICD-10-CM | POA: Diagnosis not present

## 2017-08-09 DIAGNOSIS — E039 Hypothyroidism, unspecified: Secondary | ICD-10-CM | POA: Diagnosis not present

## 2017-08-09 DIAGNOSIS — Z23 Encounter for immunization: Secondary | ICD-10-CM | POA: Diagnosis not present

## 2017-08-09 DIAGNOSIS — I10 Essential (primary) hypertension: Secondary | ICD-10-CM | POA: Diagnosis not present

## 2017-08-09 DIAGNOSIS — Z9884 Bariatric surgery status: Secondary | ICD-10-CM | POA: Diagnosis not present

## 2017-08-09 DIAGNOSIS — M069 Rheumatoid arthritis, unspecified: Secondary | ICD-10-CM | POA: Diagnosis not present

## 2017-08-10 ENCOUNTER — Other Ambulatory Visit (HOSPITAL_COMMUNITY): Payer: Self-pay | Admitting: Pulmonary Disease

## 2017-08-10 DIAGNOSIS — Z1231 Encounter for screening mammogram for malignant neoplasm of breast: Secondary | ICD-10-CM

## 2017-08-18 DIAGNOSIS — M810 Age-related osteoporosis without current pathological fracture: Secondary | ICD-10-CM | POA: Diagnosis not present

## 2017-08-27 DIAGNOSIS — D849 Immunodeficiency, unspecified: Secondary | ICD-10-CM | POA: Diagnosis not present

## 2017-08-27 DIAGNOSIS — E039 Hypothyroidism, unspecified: Secondary | ICD-10-CM | POA: Diagnosis not present

## 2017-08-27 DIAGNOSIS — M12512 Traumatic arthropathy, left shoulder: Secondary | ICD-10-CM | POA: Diagnosis not present

## 2017-09-05 DIAGNOSIS — M1712 Unilateral primary osteoarthritis, left knee: Secondary | ICD-10-CM | POA: Diagnosis not present

## 2017-09-07 ENCOUNTER — Ambulatory Visit (HOSPITAL_COMMUNITY)
Admission: RE | Admit: 2017-09-07 | Discharge: 2017-09-07 | Disposition: A | Discharge status: Home / Self Care | Payer: Medicare Other | Source: Ambulatory Visit | Attending: Pulmonary Disease | Admitting: Pulmonary Disease

## 2017-09-07 DIAGNOSIS — Z1231 Encounter for screening mammogram for malignant neoplasm of breast: Secondary | ICD-10-CM | POA: Insufficient documentation

## 2017-09-07 DIAGNOSIS — N6489 Other specified disorders of breast: Secondary | ICD-10-CM | POA: Insufficient documentation

## 2017-09-07 DIAGNOSIS — R928 Other abnormal and inconclusive findings on diagnostic imaging of breast: Secondary | ICD-10-CM | POA: Insufficient documentation

## 2017-09-08 ENCOUNTER — Other Ambulatory Visit (HOSPITAL_COMMUNITY): Payer: Self-pay | Admitting: Pulmonary Disease

## 2017-09-08 DIAGNOSIS — R928 Other abnormal and inconclusive findings on diagnostic imaging of breast: Secondary | ICD-10-CM

## 2017-09-20 ENCOUNTER — Ambulatory Visit (HOSPITAL_COMMUNITY)
Admission: RE | Admit: 2017-09-20 | Discharge: 2017-09-20 | Disposition: A | Discharge status: Home / Self Care | Payer: Medicare Other | Source: Ambulatory Visit | Attending: Pulmonary Disease | Admitting: Pulmonary Disease

## 2017-09-20 DIAGNOSIS — N6489 Other specified disorders of breast: Secondary | ICD-10-CM | POA: Insufficient documentation

## 2017-09-20 DIAGNOSIS — R928 Other abnormal and inconclusive findings on diagnostic imaging of breast: Secondary | ICD-10-CM

## 2017-09-27 DIAGNOSIS — M12512 Traumatic arthropathy, left shoulder: Secondary | ICD-10-CM | POA: Diagnosis not present

## 2017-09-27 DIAGNOSIS — D849 Immunodeficiency, unspecified: Secondary | ICD-10-CM | POA: Diagnosis not present

## 2017-09-27 DIAGNOSIS — E039 Hypothyroidism, unspecified: Secondary | ICD-10-CM | POA: Diagnosis not present

## 2017-10-03 ENCOUNTER — Telehealth: Payer: Self-pay

## 2017-10-03 NOTE — Telephone Encounter (Signed)
Spoke with pt. She will see Dr. Harl Bowie @ [place] office.

## 2017-10-03 NOTE — Telephone Encounter (Signed)
-----   Message from Arnoldo Lenis, MD sent at 10/03/2017  3:15 PM EST ----- Received surgical clearance request for this patient, can see she me march 13 at 340pm in [place] to discuss cardiac clearance for her surgery   J BrancH MD

## 2017-10-12 ENCOUNTER — Ambulatory Visit: Payer: Medicare Other | Admitting: Cardiology

## 2017-10-12 ENCOUNTER — Encounter: Payer: Self-pay | Admitting: Cardiology

## 2017-10-12 VITALS — BP 111/71 | HR 53 | Ht 61.0 in | Wt 130.6 lb

## 2017-10-12 DIAGNOSIS — Z01810 Encounter for preprocedural cardiovascular examination: Secondary | ICD-10-CM | POA: Diagnosis not present

## 2017-10-12 NOTE — Patient Instructions (Signed)

## 2017-10-12 NOTE — Progress Notes (Signed)
Clinical Summary Kathy [name] is a 77 y.o.female  seen today for follow up of the following medical problems. This is a focused visit regarding preoperative evaluation for upcoming knee surgery   1. Preoperative evaluation - denies any recent cardiopulmonary symptoms. No significant chest pain, SOB or DOE - reports she walks up   -Highest level of activity is housework, walking to [employer]. Can walk up flight stairs with significant symptoms, does this regularly at her home though somewhat limited due to her knee pain.     Past Medical History:  Diagnosis Date  . Anemia    FROM BLEEDING ULCER  . Anxiety    takes Alprazolam daily as needed  . Arthritis    dx with RA 2017  . Diverticulosis   . GERD (gastroesophageal reflux disease)   . Headache(784.0)    occasionally  . History of blood transfusion    no abnormal reaction noted  . History of bronchitis    last time many yrs ago  . Hyperlipidemia    PT DENIES THIS DX -  ON NO MEDS AND NO ONE HAS TOLD HER  . Hypertension    takes Amlodipine daily  . Hypothyroidism    takes Synthroid daily  . Insomnia   . Joint pain   . 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 Nocturia   . Numbness    occasionally left arm at night  . Osteoporosis    takes Fosamax weekly  . Peripheral edema    takes Lasix daily as needed  . PONV (postoperative nausea and vomiting)   . 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 Stomach ulcer      Allergies  Allergen Reactions  . Aspirin Other (See Comments)    Adult Aspirin: upset stomach / irritates ulcer      Current Outpatient Medications  Medication Sig Dispense Refill  . acetaminophen (TYLENOL) 500 MG tablet Take 500 mg by mouth every 6 (six) hours as needed.    Marland Kitchen alendronate (FOSAMAX) 70 MG tablet Take 70 mg by mouth every Sunday. Take with a full glass of water on an empty stomach.    . ALPRAZolam (XANAX) 0.25 MG tablet Take 0.25 mg by mouth at bedtime as needed for  anxiety or sleep.    Marland Kitchen amoxicillin-clavulanate (AUGMENTIN) 875-125 MG tablet Take 1 tablet every 12 (twelve) hours by mouth. 14 tablet 0  . aspirin EC 81 MG tablet Take 81 mg by mouth daily.    . calcium carbonate (OSCAL) 1500 (600 Ca) MG TABS tablet Take 1,500 mg by mouth daily with breakfast.    . Cholecalciferol 1000 UNITS TBDP Take 1,000 Units by mouth daily.     . diclofenac sodium (VOLTAREN) 1 % GEL Apply 2 g topically 2 (two) times daily as needed (for pain in knees and shoulder).    . furosemide (LASIX) 20 MG tablet May take an additional 20 mg daily (in addition to 40 mg daily dose) for leg swelling 90 tablet 3  . furosemide (LASIX) 40 MG tablet Take 40 mg by mouth daily.     . Multiple Vitamin (MULTIVITAMIN WITH MINERALS) TABS tablet Take 1 tablet by mouth daily.     . Omega-3 Fatty Acids (FISH OIL) 1000 MG CAPS Take 1,000 mg by mouth daily.    . potassium chloride SA (K-DUR,KLOR-CON) 20 MEQ tablet TAKE 2 TABLETS DAILY. 60 tablet 6  . SYNTHROID 100 MCG tablet Take 100 mcg by mouth daily before breakfast.     .  traMADol (ULTRAM) 50 MG tablet Take 50 mg by mouth every 6 (six) hours as needed for moderate pain.     . traZODone (DESYREL) 50 MG tablet Take 50 mg by mouth at bedtime as needed.      No current facility-administered medications for this visit.      Past Surgical History:  Procedure Laterality Date  . ABDOMINAL HYSTERECTOMY    . cataract surgery Bilateral   . CHOLECYSTECTOMY    . COLONOSCOPY    . EYE SURGERY     CATARACTS BOTH  . gastric bypass surgery    . JOINT REPLACEMENT Bilateral    hip  . REVERSE SHOULDER ARTHROPLASTY Right 05/11/2016   Procedure: REVERSE SHOULDER ARTHROPLASTY;  Surgeon: Marchia Bond, MD;  Location: [place];  Service: Orthopedics;  Laterality: Right;  . REVISION TOTAL HIP ARTHROPLASTY Left 10/03/2013   DR Mayer Camel  . TOTAL HIP REVISION Left 10/03/2013   Procedure: TOTAL HIP REVISION- left;  Surgeon: Kerin Salen, MD;  Location: [place];  Service:  Orthopedics;  Laterality: Left;  . TOTAL SHOULDER ARTHROPLASTY Left 11/09/2016   Procedure: TOTAL REVERSE SHOULDER ARTHROPLASTY;  Surgeon: Marchia Bond, MD;  Location: [place];  Service: Orthopedics;  Laterality: Left;  . TOTAL SHOULDER REPLACEMENT Left 10/2016  . TOTAL THYROIDECTOMY       Allergies  Allergen Reactions  . Aspirin Other (See Comments)    Adult Aspirin: upset stomach / irritates ulcer       Family History  Problem Relation Age of Onset  . Diabetes Mother   . Congestive Heart Failure Mother   . Lung cancer Father   . Thrombosis Sister   . CAD Brother        CABG     Social History Ms. Hilgers reports that  has never smoked. she has never used smokeless tobacco. Ms. Geisel reports that she does not drink alcohol.   Review of Systems CONSTITUTIONAL: No weight loss, fever, chills, weakness or fatigue.  HEENT: Eyes: No visual loss, blurred vision, double vision or yellow sclerae.No hearing loss, sneezing, congestion, runny nose or sore throat.  SKIN: No rash or itching.  CARDIOVASCULAR: per hpi RESPIRATORY: No shortness of breath, cough or sputum.  GASTROINTESTINAL: No anorexia, nausea, vomiting or diarrhea. No abdominal pain or blood.  GENITOURINARY: No burning on urination, no polyuria NEUROLOGICAL: No headache, dizziness, syncope, paralysis, ataxia, numbness or tingling in the extremities. No change in bowel or bladder control.  MUSCULOSKELETAL: knee pain LYMPHATICS: No enlarged nodes. No history of splenectomy.  PSYCHIATRIC: No history of depression or anxiety.  ENDOCRINOLOGIC: No reports of sweating, cold or heat intolerance. No polyuria or polydipsia.  Marland Kitchen   Physical Examination Vitals:   10/12/17 1524  BP: 111/71  Pulse: (!) 53  SpO2: 100%   Vitals:   10/12/17 1524  Weight: 130 lb 9.6 oz (59.2 kg)  Height: 5\' 1"  (1.549 m)    Gen: resting comfortably, no acute distress HEENT: no scleral icterus, pupils equal round and reactive, no palptable  cervical adenopathy,  CV: RRR, no m/r/g, no jvd Resp: Clear to auscultation bilaterally GI: abdomen is soft, non-tender, non-distended, normal bowel sounds, no hepatosplenomegaly MSK: extremities are warm, no edema.  Skin: warm, no rash Neuro:  no focal deficits Psych: appropriate affect   Diagnostic Studies 11/2016 echo Study Conclusions  - Left ventricle: The cavity size was normal. Wall thickness was normal. Systolic function was normal. The estimated ejection fraction was in the range of 55% to 60%. Wall motion  was normal; there were no regional wall motion abnormalities. Doppler parameters are consistent with abnormal left ventricular relaxation (grade 1 diastolic dysfunction). - Aortic valve: Mildly calcified annulus. Trileaflet. - Mitral valve: There was trivial regurgitation. - Right ventricle: The cavity size was mildly dilated. - Right atrium: The atrium was at the upper limits of normal in size. - Atrial septum: No defect or patent foramen ovale was identified. - Tricuspid valve: There was trivial regurgitation. - Pulmonary arteries: PA peak pressure: 20 mm Hg (S). - Pericardium, extracardiac: A small pericardial effusion was identified circumferential to the heart.  Impressions:  - Normal LV wall thickness with LVEF 55-60% and grade 1 diastolic dysfunction. Trivial mitral regurgitation. Mildly calcified aortic annulus. Mild dilated right ventricle and upper normal right atrial chamber size. Trivial tricuspid regurgitation with PASP estimated 20 mmHg. Small circumferential pericardial effusion.    Assessment and Plan  1. Preoperative evaluation - no active acute cardiac conditions. 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       Arnoldo Lenis, M.D.

## 2017-10-16 ENCOUNTER — Encounter: Payer: Self-pay | Admitting: Cardiology

## 2017-10-25 DIAGNOSIS — M12512 Traumatic arthropathy, left shoulder: Secondary | ICD-10-CM | POA: Diagnosis not present

## 2017-10-25 DIAGNOSIS — E039 Hypothyroidism, unspecified: Secondary | ICD-10-CM | POA: Diagnosis not present

## 2017-10-25 DIAGNOSIS — D849 Immunodeficiency, unspecified: Secondary | ICD-10-CM | POA: Diagnosis not present

## 2017-11-08 DIAGNOSIS — Z0000 Encounter for general adult medical examination without abnormal findings: Secondary | ICD-10-CM | POA: Diagnosis not present

## 2017-11-09 DIAGNOSIS — E039 Hypothyroidism, unspecified: Secondary | ICD-10-CM | POA: Diagnosis not present

## 2017-11-09 DIAGNOSIS — R931 Abnormal findings on diagnostic imaging of heart and coronary circulation: Secondary | ICD-10-CM | POA: Diagnosis not present

## 2017-11-09 DIAGNOSIS — R600 Localized edema: Secondary | ICD-10-CM | POA: Diagnosis not present

## 2017-11-09 DIAGNOSIS — I10 Essential (primary) hypertension: Secondary | ICD-10-CM | POA: Diagnosis not present

## 2017-11-16 ENCOUNTER — Other Ambulatory Visit: Payer: Self-pay | Admitting: Orthopedic Surgery

## 2017-11-17 NOTE — Pre-Procedure Instructions (Signed)
[place]  11/17/2017      [facility] - [place], [place] [address] Phone: [phone] Fax: [phone]    Your procedure is scheduled on April 30  Report to [place] at 530 A.M.  Call this number if you have problems the morning of surgery:  [phone]   Remember:  Do not eat food or drink liquids after midnight.  Take these medicines the morning of surgery with A SIP OF WATER Alprazolam (Xanax), Omeprazole (Prilosec) if needed, Synthroid, Tramadol (Ultram) if needed  Stop taking aspirin as directed by your Dr.  Stop taking BC's, Goody's, Herbal medications, Fish Oil, Aleve, Ibuprofen, Advil, Motrin, Vitamins    Do not wear jewelry, make-up or nail polish.  Do not wear lotions, powders, or perfumes, or deodorant.  Do not shave 48 hours prior to surgery.  Men may shave face and neck.  Do not bring valuables to the hospital.  [facility] is not responsible for any belongings or valuables.  Contacts, dentures or bridgework may not be worn into surgery.  Leave your suitcase in the car.  After surgery it may be brought to your room.  For patients admitted to the hospital, discharge time will be determined by your treatment team.  Patients discharged the day of surgery will not be allowed to drive home.    Special instructions:   [place]- Preparing For Surgery  Before surgery, you can play an important role. Because skin is not sterile, your skin needs to be as free of germs as possible. You can reduce the number of germs on your skin by washing with CHG (chlorahexidine gluconate) Soap before surgery.  CHG is an antiseptic cleaner which kills germs and bonds with the skin to continue killing germs even after washing.  Please do not use if you have an allergy to CHG or antibacterial soaps. If your skin becomes reddened/irritated stop using the CHG.  Do not shave (including legs and  underarms) for at least 48 hours prior to first CHG shower. It is OK to shave your face.  Please follow these instructions carefully.   1. Shower the NIGHT BEFORE SURGERY and the MORNING OF SURGERY with CHG.   2. If you chose to wash your hair, wash your hair first as usual with your normal shampoo.  3. After you shampoo, rinse your hair and body thoroughly to remove the shampoo.  4. Use CHG as you would any other liquid soap. You can apply CHG directly to the skin and wash gently with a scrungie or a clean washcloth.   5. Apply the CHG Soap to your body ONLY FROM THE NECK DOWN.  Do not use on open wounds or open sores. Avoid contact with your eyes, ears, mouth and genitals (private parts). Wash Face and genitals (private parts)  with your normal soap.  6. Wash thoroughly, paying special attention to the area where your surgery will be performed.  7. Thoroughly rinse your body with warm water from the neck down.  8. DO NOT shower/wash with your normal soap after using and rinsing off the CHG Soap.  9. Pat yourself dry with a CLEAN TOWEL.  10. Wear CLEAN PAJAMAS to bed the night before surgery, wear comfortable clothes the morning of surgery  11. Place CLEAN SHEETS on your bed the night of your first shower and DO NOT SLEEP WITH PETS.    Day of Surgery:  Do not apply any deodorants/lotions. Please wear clean clothes to the hospital/surgery center.      Please read over the following fact sheets that you were given. Pain Booklet, Coughing and Deep Breathing, MRSA Information and Surgical Site Infection Prevention

## 2017-11-18 ENCOUNTER — Other Ambulatory Visit: Payer: Self-pay

## 2017-11-18 ENCOUNTER — Encounter (HOSPITAL_COMMUNITY)
Admission: RE | Admit: 2017-11-18 | Discharge: 2017-11-18 | Disposition: A | Discharge status: Home / Self Care | Payer: Medicare Other | Source: Ambulatory Visit | Attending: Orthopedic Surgery | Admitting: Orthopedic Surgery

## 2017-11-18 ENCOUNTER — Encounter (HOSPITAL_COMMUNITY): Payer: Self-pay

## 2017-11-18 DIAGNOSIS — Z9049 Acquired absence of other specified parts of digestive tract: Secondary | ICD-10-CM | POA: Diagnosis not present

## 2017-11-18 DIAGNOSIS — M1712 Unilateral primary osteoarthritis, left knee: Secondary | ICD-10-CM | POA: Insufficient documentation

## 2017-11-18 DIAGNOSIS — K219 Gastro-esophageal reflux disease without esophagitis: Secondary | ICD-10-CM | POA: Diagnosis not present

## 2017-11-18 DIAGNOSIS — E890 Postprocedural hypothyroidism: Secondary | ICD-10-CM | POA: Insufficient documentation

## 2017-11-18 DIAGNOSIS — Z96612 Presence of left artificial shoulder joint: Secondary | ICD-10-CM | POA: Insufficient documentation

## 2017-11-18 DIAGNOSIS — F419 Anxiety disorder, unspecified: Secondary | ICD-10-CM | POA: Insufficient documentation

## 2017-11-18 DIAGNOSIS — Z90710 Acquired absence of both cervix and uterus: Secondary | ICD-10-CM | POA: Insufficient documentation

## 2017-11-18 DIAGNOSIS — Z8711 Personal history of peptic ulcer disease: Secondary | ICD-10-CM | POA: Insufficient documentation

## 2017-11-18 DIAGNOSIS — M069 Rheumatoid arthritis, unspecified: Secondary | ICD-10-CM | POA: Diagnosis not present

## 2017-11-18 DIAGNOSIS — E785 Hyperlipidemia, unspecified: Secondary | ICD-10-CM | POA: Diagnosis not present

## 2017-11-18 DIAGNOSIS — I10 Essential (primary) hypertension: Secondary | ICD-10-CM | POA: Diagnosis not present

## 2017-11-18 DIAGNOSIS — Z01818 Encounter for other preprocedural examination: Secondary | ICD-10-CM | POA: Diagnosis not present

## 2017-11-18 DIAGNOSIS — Z96643 Presence of artificial hip joint, bilateral: Secondary | ICD-10-CM | POA: Insufficient documentation

## 2017-11-18 DIAGNOSIS — Z79899 Other long term (current) drug therapy: Secondary | ICD-10-CM | POA: Insufficient documentation

## 2017-11-18 DIAGNOSIS — G4700 Insomnia, unspecified: Secondary | ICD-10-CM | POA: Insufficient documentation

## 2017-11-18 DIAGNOSIS — Z7982 Long term (current) use of aspirin: Secondary | ICD-10-CM | POA: Insufficient documentation

## 2017-11-18 DIAGNOSIS — Z9884 Bariatric surgery status: Secondary | ICD-10-CM | POA: Diagnosis not present

## 2017-11-18 DIAGNOSIS — Z832 Family history of diseases of the blood and blood-forming organs and certain disorders involving the immune mechanism: Secondary | ICD-10-CM | POA: Diagnosis not present

## 2017-11-18 DIAGNOSIS — Z79890 Hormone replacement therapy (postmenopausal): Secondary | ICD-10-CM | POA: Insufficient documentation

## 2017-11-18 DIAGNOSIS — Z01812 Encounter for preprocedural laboratory examination: Secondary | ICD-10-CM | POA: Diagnosis not present

## 2017-11-18 HISTORY — DX: Presence of dental prosthetic device (complete) (partial): Z97.2

## 2017-11-18 HISTORY — DX: Presence of spectacles and contact lenses: Z97.3

## 2017-11-18 LAB — CBC
HCT: 35.5 % — ABNORMAL LOW (ref 36.0–46.0)
Hemoglobin: 11.8 g/dL — ABNORMAL LOW (ref 12.0–15.0)
MCH: 31.3 pg (ref 26.0–34.0)
MCHC: 33.2 g/dL (ref 30.0–36.0)
MCV: 94.2 fL (ref 78.0–100.0)
Platelets: 221 10*3/uL (ref 150–400)
RBC: 3.77 MIL/uL — ABNORMAL LOW (ref 3.87–5.11)
RDW: 14.4 % (ref 11.5–15.5)
WBC: 3 10*3/uL — ABNORMAL LOW (ref 4.0–10.5)

## 2017-11-18 LAB — BASIC METABOLIC PANEL
Anion gap: 11 (ref 5–15)
BUN: 16 mg/dL (ref 6–20)
CO2: 24 mmol/L (ref 22–32)
Calcium: 9 mg/dL (ref 8.9–10.3)
Chloride: 106 mmol/L (ref 101–111)
Creatinine, Ser: 0.68 mg/dL (ref 0.44–1.00)
GFR calc Af Amer: >60 mL/min (ref >60)
GFR calc non Af Amer: >60 mL/min (ref >60)
Glucose, Bld: 70 mg/dL (ref 65–99)
Potassium: 3.9 mmol/L (ref 3.5–5.1)
Sodium: 141 mmol/L (ref 135–145)

## 2017-11-18 LAB — SURGICAL PCR SCREEN
MRSA, PCR: NEGATIVE
Staphylococcus aureus: NEGATIVE

## 2017-11-18 NOTE — Pre-Procedure Instructions (Signed)
    [place]  11/18/2017      [facility] - [place], [place] [address] Phone: [phone] Fax: [phone]    Your procedure is scheduled on Tuesday, November 29, 2017  Report to [facility] Admitting at 5:30 A.M.  Call this number if you have problems the morning of surgery:  [phone]   Remember: Follow surgeons pre-op instructions regarding Aspirin  Do not eat food or drink liquids after midnight Monday, November 28, 2017  Take these medicines the morning of surgery with A SIP OF WATER : SYNTHROID,  If needed: Pain medication, omeprazole (PRILOSEC) for stomach issues Stop taking vitamins, fish oil, Melatonin and herbal medications. Do not take any NSAIDs ie: Ibuprofen, Advil, Naproxen (Aleve), Motrin, diclofenac sodium (VOLTAREN)  GEL, BC and Goody Powder; stop Tuesday, November 22, 2017  Do not wear jewelry, make-up or nail polish.  Do not wear lotions, powders, or perfumes, or deodorant.  Do not shave 48 hours prior to surgery.    Do not bring valuables to the hospital.  [facility] is not responsible for any belongings or valuables.  Contacts, dentures or bridgework may not be worn into surgery.  Leave your suitcase in the car.  After surgery it may be brought to your room. Special instructions: See "[place]- Preparing For Surgery" sheet Please read over the following fact sheets that you were given. Pain Booklet, Coughing and Deep Breathing, Total Joint Packet, MRSA Information and Surgical Site Infection Prevention

## 2017-11-18 NOTE — Progress Notes (Signed)
Pt denies SOB and chest pain. Pt under the care of Dr. Harl Bowie, Cardiology. Pt denies having a cardiac cath but stated that a stress test was performed > 10 years ago. Pt denies recent labs. Pt chart forwarded to anesthesia to review cardiac clearance note.

## 2017-11-21 NOTE — Progress Notes (Signed)
Anesthesia Chart Review:  Case:  [id number] Date/Time:  11/29/17 0715   Procedure:  LEFT TOTAL KNEE ARTHROPLASTY (Left Knee)   Anesthesia type:  Choice   Pre-op diagnosis:  djd left knee   Location:  [facility] OR ROOM 04 / [place] OR   Surgeon:  Marchia Bond, MD      DISCUSSION: Patient is a 77 year old female scheduled for the above procedure. 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 Based on 05/04/16 note, she has a family history of "blood clots" (sister had a fatal one after surgery and father had a leg DVT after MVA; nephew with sickle cell anemia). She was recently evaluated by cardiology and cleared for surgery. If no acute changes then I anticipate that she can proceed as planned.   VS: BP 111/61   Pulse 73   Temp 36.7 C (Oral)   Resp 20   Ht 5\' 1"  (1.549 m)   Wt 130 lb 8 oz (59.2 kg)   SpO2 100%   BMI 24.66 kg/m   PROVIDERS: Sinda Du, MD as PCP/Pulmonologist. Carlyle Dolly, MD as Cardiology. She was seen on 10/12/17 for preoperative evaluation for knee surgery. He wrote,  "Preoperative evaluation - no active acute cardiac conditions. EKG today shows SR RBBB, no acute ischemic changes - she tolerates greater than 4 METs regularly without limitation - recommend proceeding with surgery as planned."    LABS: Labs reviewed: Acceptable for surgery. (all labs ordered are listed, but only abnormal results are displayed)  Labs Reviewed  CBC - Abnormal; Notable for the following components:      Result Value   WBC 3.0 (*)    RBC 3.77 (*)    Hemoglobin 11.8 (*)    HCT 35.5 (*)    All other components within normal limits  SURGICAL PCR SCREEN  BASIC METABOLIC PANEL   EKG: 02/15/95: SB at 50 bpm, RBBB  CV: Echo 12/09/16: Study Conclusions - Left ventricle: The cavity size was normal. Wall thickness was   normal. Systolic function was normal. The estimated ejection   fraction was in the range of 55% to 60%.  Wall motion was normal;   there were no regional wall motion abnormalities. Doppler   parameters are consistent with abnormal left ventricular   relaxation (grade 1 diastolic dysfunction). - Aortic valve: Mildly calcified annulus. Trileaflet. - Mitral valve: There was trivial regurgitation. - Right ventricle: The cavity size was mildly dilated. - Right atrium: The atrium was at the upper limits of normal in   size. - Atrial septum: No defect or patent foramen ovale was identified. - Tricuspid valve: There was trivial regurgitation. - Pulmonary arteries: PA peak pressure: 20 mm Hg (S). - Pericardium, extracardiac: A small pericardial effusion was   identified circumferential to the heart. Impressions: - Normal LV wall thickness with LVEF 55-60% and grade 1 diastolic   dysfunction. Trivial mitral regurgitation. Mildly calcified   aortic annulus. Mild dilated right ventricle and upper normal   right atrial chamber size. Trivial tricuspid regurgitation with   PASP estimated 20 mmHg. Small circumferential pericardial   effusion.  Stress Echo 05/01/09 ([place]): Negative dobutamine stress echo with no echocardiographic evidence of inducible ischemia. MHR achieved 84%. 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    Past Medical History:  Diagnosis Date  . Anemia    FROM BLEEDING ULCER  . Anxiety    takes Alprazolam daily as needed  . Arthritis  dx with RA 2017  . Diverticulosis   . GERD (gastroesophageal reflux disease)   . Headache(784.0)    occasionally  . History of blood transfusion    no abnormal reaction noted  . History of bronchitis    last time many yrs ago  . Hyperlipidemia    PT DENIES THIS DX -  ON NO MEDS AND NO ONE HAS TOLD HER  . Hypertension    takes Amlodipine daily  . Hypothyroidism    takes Synthroid daily  . Insomnia   . Joint pain   . 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 Nocturia   . Numbness    occasionally left arm  at night  . Osteoporosis    takes Fosamax weekly  . Peripheral edema    takes Lasix daily as needed  . PONV (postoperative nausea and vomiting)   . 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 Stomach ulcer   . Wears glasses   . Wears partial dentures     Past Surgical History:  Procedure Laterality Date  . ABDOMINAL HYSTERECTOMY    . cataract surgery Bilateral   . CHOLECYSTECTOMY    . COLONOSCOPY    . EYE SURGERY     CATARACTS BOTH  . gastric bypass surgery    . JOINT REPLACEMENT Bilateral    hip  . REVERSE SHOULDER ARTHROPLASTY Right 05/11/2016   Procedure: REVERSE SHOULDER ARTHROPLASTY;  Surgeon: Marchia Bond, MD;  Location: [facility];  Service: Orthopedics;  Laterality: Right;  . REVISION TOTAL HIP ARTHROPLASTY Left 10/03/2013   DR Mayer Camel  . TOTAL HIP REVISION Left 10/03/2013   Procedure: TOTAL HIP REVISION- left;  Surgeon: Kerin Salen, MD;  Location: [facility];  Service: Orthopedics;  Laterality: Left;  . TOTAL SHOULDER ARTHROPLASTY Left 11/09/2016   Procedure: TOTAL REVERSE SHOULDER ARTHROPLASTY;  Surgeon: Marchia Bond, MD;  Location: [facility];  Service: Orthopedics;  Laterality: Left;  . TOTAL SHOULDER REPLACEMENT Left 10/2016  . TOTAL THYROIDECTOMY      MEDICATIONS: . acetaminophen (TYLENOL) 500 MG tablet  . ALPRAZolam (XANAX) 0.25 MG tablet  . aspirin EC 81 MG tablet  . calcium carbonate (OSCAL) 1500 (600 Ca) MG TABS tablet  . Cholecalciferol 1000 UNITS TBDP  . denosumab (PROLIA) 60 MG/ML SOLN injection  . diclofenac sodium (VOLTAREN) 1 % GEL  . furosemide (LASIX) 20 MG tablet  . furosemide (LASIX) 40 MG tablet  . Melatonin ([employer] MELATONIN) 10 MG CAPS  . Multiple Vitamin (MULTIVITAMIN WITH MINERALS) TABS tablet  . Omega-3 Fatty Acids (FISH OIL) 1000 MG CAPS  . omeprazole (PRILOSEC) 40 MG capsule  . potassium chloride SA (K-DUR,KLOR-CON) 20 MEQ tablet  . SYNTHROID 100 MCG tablet  . traMADol (ULTRAM) 50 MG tablet  . traZODone (DESYREL) 50 MG tablet   No current  facility-administered medications for this encounter.    Patient instructed to follow surgeon's instructions regarding perioperative ASA. Last Prolia dose is documented as 08/18/17.   George Hugh [facility] Short Stay Center/Anesthesiology Phone [phone] 11/21/2017 4:25 PM

## 2017-11-25 DIAGNOSIS — M12512 Traumatic arthropathy, left shoulder: Secondary | ICD-10-CM | POA: Diagnosis not present

## 2017-11-25 DIAGNOSIS — D849 Immunodeficiency, unspecified: Secondary | ICD-10-CM | POA: Diagnosis not present

## 2017-11-25 DIAGNOSIS — E039 Hypothyroidism, unspecified: Secondary | ICD-10-CM | POA: Diagnosis not present

## 2017-11-29 ENCOUNTER — Ambulatory Visit (HOSPITAL_COMMUNITY): Payer: Medicare Other

## 2017-11-29 ENCOUNTER — Ambulatory Visit (HOSPITAL_COMMUNITY): Payer: Medicare Other | Admitting: Vascular Surgery

## 2017-11-29 ENCOUNTER — Inpatient Hospital Stay (HOSPITAL_COMMUNITY)
Admission: AD | Admit: 2017-11-29 | Discharge: 2017-12-01 | DRG: 470 | Disposition: A | Discharge status: Skilled Nursing Facility | Payer: Medicare Other | Attending: Orthopedic Surgery | Admitting: Orthopedic Surgery

## 2017-11-29 ENCOUNTER — Other Ambulatory Visit: Payer: Self-pay

## 2017-11-29 ENCOUNTER — Encounter (HOSPITAL_COMMUNITY): Payer: Self-pay | Admitting: *Deleted

## 2017-11-29 ENCOUNTER — Encounter (HOSPITAL_COMMUNITY)
Admission: AD | Disposition: A | Discharge status: Skilled Nursing Facility | Payer: Self-pay | Source: Home / Self Care | Attending: Orthopedic Surgery

## 2017-11-29 ENCOUNTER — Ambulatory Visit (HOSPITAL_COMMUNITY): Payer: Medicare Other | Admitting: Certified Registered Nurse Anesthetist

## 2017-11-29 DIAGNOSIS — M25562 Pain in left knee: Secondary | ICD-10-CM | POA: Diagnosis present

## 2017-11-29 DIAGNOSIS — M1712 Unilateral primary osteoarthritis, left knee: Principal | ICD-10-CM | POA: Diagnosis present

## 2017-11-29 DIAGNOSIS — F419 Anxiety disorder, unspecified: Secondary | ICD-10-CM | POA: Diagnosis not present

## 2017-11-29 DIAGNOSIS — G4700 Insomnia, unspecified: Secondary | ICD-10-CM | POA: Diagnosis not present

## 2017-11-29 DIAGNOSIS — E785 Hyperlipidemia, unspecified: Secondary | ICD-10-CM | POA: Diagnosis present

## 2017-11-29 DIAGNOSIS — K219 Gastro-esophageal reflux disease without esophagitis: Secondary | ICD-10-CM | POA: Diagnosis not present

## 2017-11-29 DIAGNOSIS — Z7983 Long term (current) use of bisphosphonates: Secondary | ICD-10-CM

## 2017-11-29 DIAGNOSIS — M810 Age-related osteoporosis without current pathological fracture: Secondary | ICD-10-CM | POA: Diagnosis not present

## 2017-11-29 DIAGNOSIS — D62 Acute posthemorrhagic anemia: Secondary | ICD-10-CM | POA: Diagnosis not present

## 2017-11-29 DIAGNOSIS — Z79890 Hormone replacement therapy (postmenopausal): Secondary | ICD-10-CM | POA: Diagnosis not present

## 2017-11-29 DIAGNOSIS — Z79899 Other long term (current) drug therapy: Secondary | ICD-10-CM

## 2017-11-29 DIAGNOSIS — Z96611 Presence of right artificial shoulder joint: Secondary | ICD-10-CM | POA: Diagnosis present

## 2017-11-29 DIAGNOSIS — Z96652 Presence of left artificial knee joint: Secondary | ICD-10-CM

## 2017-11-29 DIAGNOSIS — Z6824 Body mass index (BMI) 24.0-24.9, adult: Secondary | ICD-10-CM | POA: Diagnosis not present

## 2017-11-29 DIAGNOSIS — Z471 Aftercare following joint replacement surgery: Secondary | ICD-10-CM | POA: Diagnosis not present

## 2017-11-29 DIAGNOSIS — R278 Other lack of coordination: Secondary | ICD-10-CM | POA: Diagnosis not present

## 2017-11-29 DIAGNOSIS — R51 Headache: Secondary | ICD-10-CM | POA: Diagnosis not present

## 2017-11-29 DIAGNOSIS — E669 Obesity, unspecified: Secondary | ICD-10-CM | POA: Diagnosis not present

## 2017-11-29 DIAGNOSIS — E890 Postprocedural hypothyroidism: Secondary | ICD-10-CM | POA: Diagnosis not present

## 2017-11-29 DIAGNOSIS — R2689 Other abnormalities of gait and mobility: Secondary | ICD-10-CM | POA: Diagnosis not present

## 2017-11-29 DIAGNOSIS — I10 Essential (primary) hypertension: Secondary | ICD-10-CM | POA: Diagnosis not present

## 2017-11-29 DIAGNOSIS — Z8249 Family history of ischemic heart disease and other diseases of the circulatory system: Secondary | ICD-10-CM

## 2017-11-29 DIAGNOSIS — Z8709 Personal history of other diseases of the respiratory system: Secondary | ICD-10-CM

## 2017-11-29 DIAGNOSIS — E039 Hypothyroidism, unspecified: Secondary | ICD-10-CM | POA: Diagnosis not present

## 2017-11-29 DIAGNOSIS — M2550 Pain in unspecified joint: Secondary | ICD-10-CM | POA: Diagnosis not present

## 2017-11-29 DIAGNOSIS — M6281 Muscle weakness (generalized): Secondary | ICD-10-CM | POA: Diagnosis not present

## 2017-11-29 DIAGNOSIS — Z96643 Presence of artificial hip joint, bilateral: Secondary | ICD-10-CM | POA: Diagnosis present

## 2017-11-29 DIAGNOSIS — Z7401 Bed confinement status: Secondary | ICD-10-CM | POA: Diagnosis not present

## 2017-11-29 DIAGNOSIS — Z7982 Long term (current) use of aspirin: Secondary | ICD-10-CM | POA: Diagnosis not present

## 2017-11-29 HISTORY — PX: TOTAL KNEE ARTHROPLASTY: SHX125

## 2017-11-29 HISTORY — DX: Unilateral primary osteoarthritis, left knee: M17.12

## 2017-11-29 SURGERY — ARTHROPLASTY, KNEE, TOTAL
Anesthesia: Regional | Site: Knee | Laterality: Left

## 2017-11-29 MED ORDER — MELATONIN 10 MG PO CAPS
10.0000 mg | ORAL_CAPSULE | Freq: Every day | ORAL | Status: DC
  Filled 2017-11-29: qty 1

## 2017-11-29 MED ORDER — POTASSIUM CHLORIDE IN NACL 20-0.45 MEQ/L-% IV SOLN
INTRAVENOUS | Status: DC
  Administered 2017-11-29: 14:00:00 via INTRAVENOUS
  Filled 2017-11-29 (×2): qty 1000

## 2017-11-29 MED ORDER — POTASSIUM CHLORIDE CRYS ER 20 MEQ PO TBCR
40.0000 meq | EXTENDED_RELEASE_TABLET | Freq: Every day | ORAL | Status: DC
  Administered 2017-11-29 – 2017-12-01 (×3): 40 meq via ORAL
  Filled 2017-11-29 (×3): qty 2

## 2017-11-29 MED ORDER — ACETAMINOPHEN 500 MG PO TABS
500.0000 mg | ORAL_TABLET | Freq: Four times a day (QID) | ORAL | Status: AC
  Administered 2017-11-29 – 2017-11-30 (×4): 500 mg via ORAL
  Filled 2017-11-29 (×4): qty 1

## 2017-11-29 MED ORDER — BACLOFEN 10 MG PO TABS: 10.0000 mg | ORAL_TABLET | Freq: Three times a day (TID) | ORAL | 0 refills | Status: DC

## 2017-11-29 MED ORDER — KETOROLAC TROMETHAMINE 30 MG/ML IJ SOLN
INTRAMUSCULAR | Status: AC
  Filled 2017-11-29: qty 1

## 2017-11-29 MED ORDER — FENTANYL CITRATE (PF) 250 MCG/5ML IJ SOLN
INTRAMUSCULAR | Status: AC
  Filled 2017-11-29: qty 5

## 2017-11-29 MED ORDER — PHENOL 1.4 % MT LIQD: 1.0000 | OROMUCOSAL | Status: DC | PRN

## 2017-11-29 MED ORDER — CEFAZOLIN SODIUM-DEXTROSE 2-4 GM/100ML-% IV SOLN
2.0000 g | Freq: Four times a day (QID) | INTRAVENOUS | Status: AC
  Administered 2017-11-29 (×2): 2 g via INTRAVENOUS
  Filled 2017-11-29 (×2): qty 100

## 2017-11-29 MED ORDER — OXYCODONE HCL 5 MG PO TABS
5.0000 mg | ORAL_TABLET | Freq: Once | ORAL | Status: AC | PRN
  Administered 2017-11-29: 5 mg via ORAL

## 2017-11-29 MED ORDER — LEVOTHYROXINE SODIUM 75 MCG PO TABS
75.0000 ug | ORAL_TABLET | Freq: Every day | ORAL | Status: DC
Start: 2017-11-30 — End: 2017-12-01
  Administered 2017-11-30 – 2017-12-01 (×2): 75 ug via ORAL
  Filled 2017-11-29 (×2): qty 1

## 2017-11-29 MED ORDER — PROPOFOL 500 MG/50ML IV EMUL
INTRAVENOUS | Status: DC | PRN
  Administered 2017-11-29: 75 ug/kg/min via INTRAVENOUS

## 2017-11-29 MED ORDER — HYDROMORPHONE HCL 2 MG/ML IJ SOLN
INTRAMUSCULAR | Status: AC
  Filled 2017-11-29: qty 1

## 2017-11-29 MED ORDER — HYDROCODONE-ACETAMINOPHEN 10-325 MG PO TABS: 1.0000 | ORAL_TABLET | Freq: Four times a day (QID) | ORAL | 0 refills | Status: DC | PRN

## 2017-11-29 MED ORDER — CALCIUM CARBONATE 1500 (600 CA) MG PO TABS
300.0000 mg | ORAL_TABLET | Freq: Three times a day (TID) | ORAL | Status: DC
  Filled 2017-11-29: qty 1

## 2017-11-29 MED ORDER — 0.9 % SODIUM CHLORIDE (POUR BTL) OPTIME
TOPICAL | Status: DC | PRN
  Administered 2017-11-29: 1000 mL

## 2017-11-29 MED ORDER — ALPRAZOLAM 0.25 MG PO TABS: 0.2500 mg | ORAL_TABLET | Freq: Every evening | ORAL | Status: DC | PRN

## 2017-11-29 MED ORDER — VITAMIN D 1000 UNITS PO TABS
1000.0000 [IU] | ORAL_TABLET | Freq: Two times a day (BID) | ORAL | Status: DC
  Administered 2017-11-29 – 2017-12-01 (×5): 1000 [IU] via ORAL
  Filled 2017-11-29 (×9): qty 1

## 2017-11-29 MED ORDER — DIPHENHYDRAMINE HCL 12.5 MG/5ML PO ELIX: 12.5000 mg | ORAL_SOLUTION | ORAL | Status: DC | PRN

## 2017-11-29 MED ORDER — ONDANSETRON HCL 4 MG/2ML IJ SOLN
INTRAMUSCULAR | Status: AC
  Filled 2017-11-29: qty 2

## 2017-11-29 MED ORDER — METHOCARBAMOL 500 MG PO TABS
ORAL_TABLET | ORAL | Status: AC
  Filled 2017-11-29: qty 1

## 2017-11-29 MED ORDER — MAGNESIUM CITRATE PO SOLN: 1.0000 | Freq: Once | ORAL | Status: DC | PRN

## 2017-11-29 MED ORDER — FUROSEMIDE 40 MG PO TABS
40.0000 mg | ORAL_TABLET | Freq: Every evening | ORAL | Status: DC
  Administered 2017-11-29 – 2017-11-30 (×2): 40 mg via ORAL
  Filled 2017-11-29 (×2): qty 1

## 2017-11-29 MED ORDER — KETOROLAC TROMETHAMINE 30 MG/ML IJ SOLN
INTRAMUSCULAR | Status: DC | PRN
  Administered 2017-11-29: 30 mg

## 2017-11-29 MED ORDER — PROPOFOL 10 MG/ML IV BOLUS
INTRAVENOUS | Status: AC
  Filled 2017-11-29: qty 20

## 2017-11-29 MED ORDER — HYDROCODONE-ACETAMINOPHEN 5-325 MG PO TABS: 1.0000 | ORAL_TABLET | ORAL | Status: DC | PRN

## 2017-11-29 MED ORDER — LACTATED RINGERS IV SOLN
INTRAVENOUS | Status: DC | PRN
  Administered 2017-11-29 (×2): via INTRAVENOUS

## 2017-11-29 MED ORDER — METOCLOPRAMIDE HCL 5 MG/ML IJ SOLN: 5.0000 mg | Freq: Three times a day (TID) | INTRAMUSCULAR | Status: DC | PRN

## 2017-11-29 MED ORDER — METHOCARBAMOL 1000 MG/10ML IJ SOLN
500.0000 mg | Freq: Four times a day (QID) | INTRAMUSCULAR | Status: DC | PRN
  Filled 2017-11-29: qty 5

## 2017-11-29 MED ORDER — ONDANSETRON HCL 4 MG PO TABS
4.0000 mg | ORAL_TABLET | Freq: Four times a day (QID) | ORAL | Status: DC | PRN
  Administered 2017-11-29: 4 mg via ORAL
  Filled 2017-11-29: qty 1

## 2017-11-29 MED ORDER — RIVAROXABAN 10 MG PO TABS: 10.0000 mg | ORAL_TABLET | Freq: Every day | ORAL | 0 refills | Status: DC

## 2017-11-29 MED ORDER — MORPHINE SULFATE (PF) 2 MG/ML IV SOLN: 0.5000 mg | INTRAVENOUS | Status: DC | PRN

## 2017-11-29 MED ORDER — HYDROMORPHONE HCL 2 MG/ML IJ SOLN
0.2500 mg | INTRAMUSCULAR | Status: DC | PRN
  Administered 2017-11-29: 0.3 mg via INTRAVENOUS

## 2017-11-29 MED ORDER — DEXAMETHASONE SODIUM PHOSPHATE 10 MG/ML IJ SOLN
INTRAMUSCULAR | Status: AC
  Filled 2017-11-29: qty 1

## 2017-11-29 MED ORDER — DEXAMETHASONE SODIUM PHOSPHATE 10 MG/ML IJ SOLN
INTRAMUSCULAR | Status: DC | PRN
  Administered 2017-11-29: 10 mg via INTRAVENOUS

## 2017-11-29 MED ORDER — RIVAROXABAN 10 MG PO TABS
10.0000 mg | ORAL_TABLET | Freq: Every day | ORAL | Status: DC
  Administered 2017-11-30 – 2017-12-01 (×2): 10 mg via ORAL
  Filled 2017-11-29 (×2): qty 1

## 2017-11-29 MED ORDER — BUPIVACAINE HCL (PF) 0.25 % IJ SOLN
INTRAMUSCULAR | Status: AC
  Filled 2017-11-29: qty 30

## 2017-11-29 MED ORDER — PROPOFOL 10 MG/ML IV BOLUS
INTRAVENOUS | Status: DC | PRN
  Administered 2017-11-29: 30 mg via INTRAVENOUS

## 2017-11-29 MED ORDER — FENTANYL CITRATE (PF) 100 MCG/2ML IJ SOLN
INTRAMUSCULAR | Status: DC | PRN
  Administered 2017-11-29 (×2): 50 ug via INTRAVENOUS

## 2017-11-29 MED ORDER — LIDOCAINE 2% (20 MG/ML) 5 ML SYRINGE
INTRAMUSCULAR | Status: AC
Start: 2017-11-29 — End: ?
  Filled 2017-11-29: qty 5

## 2017-11-29 MED ORDER — OXYCODONE HCL 5 MG/5ML PO SOLN: 5.0000 mg | Freq: Once | ORAL | Status: AC | PRN

## 2017-11-29 MED ORDER — DOCUSATE SODIUM 100 MG PO CAPS
100.0000 mg | ORAL_CAPSULE | Freq: Two times a day (BID) | ORAL | Status: DC
  Administered 2017-11-29 – 2017-12-01 (×4): 100 mg via ORAL
  Filled 2017-11-29 (×4): qty 1

## 2017-11-29 MED ORDER — SENNA-DOCUSATE SODIUM 8.6-50 MG PO TABS: 2.0000 | ORAL_TABLET | Freq: Every day | ORAL | 1 refills | Status: DC

## 2017-11-29 MED ORDER — SODIUM CHLORIDE 0.9 % IR SOLN
Status: DC | PRN
  Administered 2017-11-29: 1000 mL

## 2017-11-29 MED ORDER — METOCLOPRAMIDE HCL 5 MG PO TABS: 5.0000 mg | ORAL_TABLET | Freq: Three times a day (TID) | ORAL | Status: DC | PRN

## 2017-11-29 MED ORDER — BISACODYL 10 MG RE SUPP: 10.0000 mg | Freq: Every day | RECTAL | Status: DC | PRN

## 2017-11-29 MED ORDER — METHOCARBAMOL 500 MG PO TABS
500.0000 mg | ORAL_TABLET | Freq: Four times a day (QID) | ORAL | Status: DC | PRN
  Administered 2017-11-29 – 2017-12-01 (×4): 500 mg via ORAL
  Filled 2017-11-29 (×3): qty 1

## 2017-11-29 MED ORDER — KETOROLAC TROMETHAMINE 15 MG/ML IJ SOLN
7.5000 mg | Freq: Four times a day (QID) | INTRAMUSCULAR | Status: AC
  Administered 2017-11-29 – 2017-11-30 (×4): 7.5 mg via INTRAVENOUS
  Filled 2017-11-29 (×4): qty 1

## 2017-11-29 MED ORDER — CEFAZOLIN SODIUM-DEXTROSE 2-4 GM/100ML-% IV SOLN
2.0000 g | INTRAVENOUS | Status: AC
  Administered 2017-11-29: 2 g via INTRAVENOUS
  Filled 2017-11-29: qty 100

## 2017-11-29 MED ORDER — PANTOPRAZOLE SODIUM 40 MG PO TBEC
80.0000 mg | DELAYED_RELEASE_TABLET | Freq: Every day | ORAL | Status: DC
  Administered 2017-11-29 – 2017-12-01 (×3): 80 mg via ORAL
  Filled 2017-11-29 (×3): qty 2

## 2017-11-29 MED ORDER — ALUM & MAG HYDROXIDE-SIMETH 200-200-20 MG/5ML PO SUSP: 30.0000 mL | ORAL | Status: DC | PRN

## 2017-11-29 MED ORDER — ADULT MULTIVITAMIN W/MINERALS CH
1.0000 | ORAL_TABLET | Freq: Every day | ORAL | Status: DC
  Administered 2017-11-29 – 2017-12-01 (×3): 1 via ORAL
  Filled 2017-11-29 (×3): qty 1

## 2017-11-29 MED ORDER — CHLORHEXIDINE GLUCONATE 4 % EX LIQD: 60.0000 mL | Freq: Once | CUTANEOUS | Status: DC

## 2017-11-29 MED ORDER — BUPIVACAINE HCL (PF) 0.25 % IJ SOLN
INTRAMUSCULAR | Status: DC | PRN
  Administered 2017-11-29: 30 mL

## 2017-11-29 MED ORDER — DEXAMETHASONE SODIUM PHOSPHATE 10 MG/ML IJ SOLN
10.0000 mg | Freq: Once | INTRAMUSCULAR | Status: AC
  Administered 2017-11-30: 10 mg via INTRAVENOUS
  Filled 2017-11-29: qty 1

## 2017-11-29 MED ORDER — ACETAMINOPHEN 325 MG PO TABS: 325.0000 mg | ORAL_TABLET | Freq: Four times a day (QID) | ORAL | Status: DC | PRN

## 2017-11-29 MED ORDER — ONDANSETRON HCL 4 MG PO TABS: 4.0000 mg | ORAL_TABLET | Freq: Three times a day (TID) | ORAL | 0 refills | Status: DC | PRN

## 2017-11-29 MED ORDER — MELATONIN 3 MG PO TABS
9.0000 mg | ORAL_TABLET | Freq: Every day | ORAL | Status: DC
  Administered 2017-11-29 – 2017-11-30 (×2): 9 mg via ORAL
  Filled 2017-11-29 (×2): qty 3

## 2017-11-29 MED ORDER — MENTHOL 3 MG MT LOZG
1.0000 | LOZENGE | OROMUCOSAL | Status: DC | PRN
Start: 2017-11-29 — End: 2017-12-01

## 2017-11-29 MED ORDER — MIDAZOLAM HCL 2 MG/2ML IJ SOLN
INTRAMUSCULAR | Status: AC
  Filled 2017-11-29: qty 2

## 2017-11-29 MED ORDER — ONDANSETRON HCL 4 MG/2ML IJ SOLN
INTRAMUSCULAR | Status: DC | PRN
  Administered 2017-11-29: 4 mg via INTRAVENOUS

## 2017-11-29 MED ORDER — BUPIVACAINE IN DEXTROSE 0.75-8.25 % IT SOLN
INTRATHECAL | Status: DC | PRN
  Administered 2017-11-29: 2 mL via INTRATHECAL

## 2017-11-29 MED ORDER — OXYCODONE HCL 5 MG PO TABS
ORAL_TABLET | ORAL | Status: AC
  Filled 2017-11-29: qty 1

## 2017-11-29 MED ORDER — PROMETHAZINE HCL 25 MG/ML IJ SOLN
6.2500 mg | INTRAMUSCULAR | Status: DC | PRN
Start: 2017-11-29 — End: 2017-11-29

## 2017-11-29 MED ORDER — POLYETHYLENE GLYCOL 3350 17 G PO PACK: 17.0000 g | PACK | Freq: Every day | ORAL | Status: DC | PRN

## 2017-11-29 MED ORDER — TRAZODONE HCL 50 MG PO TABS: 50.0000 mg | ORAL_TABLET | Freq: Every evening | ORAL | Status: DC | PRN

## 2017-11-29 MED ORDER — MIDAZOLAM HCL 5 MG/5ML IJ SOLN
INTRAMUSCULAR | Status: DC | PRN
  Administered 2017-11-29 (×2): 1 mg via INTRAVENOUS

## 2017-11-29 MED ORDER — ONDANSETRON HCL 4 MG/2ML IJ SOLN: 4.0000 mg | Freq: Four times a day (QID) | INTRAMUSCULAR | Status: DC | PRN

## 2017-11-29 MED ORDER — HYDROCODONE-ACETAMINOPHEN 7.5-325 MG PO TABS
1.0000 | ORAL_TABLET | ORAL | Status: DC | PRN
  Administered 2017-11-29 – 2017-12-01 (×7): 2 via ORAL
  Filled 2017-11-29 (×7): qty 2

## 2017-11-29 MED ORDER — CALCIUM CARBONATE 1250 (500 CA) MG PO TABS
500.0000 mg | ORAL_TABLET | Freq: Three times a day (TID) | ORAL | Status: DC
  Administered 2017-11-29 – 2017-12-01 (×5): 500 mg via ORAL
  Filled 2017-11-29 (×5): qty 1

## 2017-11-29 SURGICAL SUPPLY — 62 items
BANDAGE ELASTIC 6 VELCRO ST LF (GAUZE/BANDAGES/DRESSINGS) ×1 IMPLANT
BANDAGE ESMARK 6X9 LF (GAUZE/BANDAGES/DRESSINGS) ×1 IMPLANT
BEARING TIBIAL PS 10X71/75 (Joint) ×1 IMPLANT
BIT DRILL QUICK REL 1/8 2PK SL (DRILL) IMPLANT
BLADE SAG 18X100X1.27 (BLADE) ×2 IMPLANT
BLADE SAW SGTL 13X75X1.27 (BLADE) ×2 IMPLANT
BNDG CMPR 9X6 STRL LF SNTH (GAUZE/BANDAGES/DRESSINGS) ×1
BNDG CMPR MED 10X6 ELC LF (GAUZE/BANDAGES/DRESSINGS)
BNDG ELASTIC 6X10 VLCR STRL LF (GAUZE/BANDAGES/DRESSINGS) ×1 IMPLANT
BNDG ESMARK 6X9 LF (GAUZE/BANDAGES/DRESSINGS) ×2
BOWL SMART MIX CTS (DISPOSABLE) ×2 IMPLANT
BRNG TIB 71/75X10 POST VNG (Joint) ×1 IMPLANT
CEMENT BONE R 1X40 (Cement) ×2 IMPLANT
CLSR STERI-STRIP ANTIMIC 1/2X4 (GAUZE/BANDAGES/DRESSINGS) ×2 IMPLANT
COMP FEM VG IL 67.5 LT (Knees) ×2 IMPLANT
COMPONENT FEM VG IL 67.5 LT (Knees) IMPLANT
COVER SURGICAL LIGHT HANDLE (MISCELLANEOUS) ×2 IMPLANT
CUFF TOURNIQUET SINGLE 34IN LL (TOURNIQUET CUFF) ×2 IMPLANT
DISTAL FEMORAL PEG (Knees) ×2 IMPLANT
DRAPE EXTREMITY T 121X128X90 (DRAPE) ×2 IMPLANT
DRAPE HALF SHEET 40X57 (DRAPES) ×2 IMPLANT
DRAPE U-SHAPE 47X51 STRL (DRAPES) ×2 IMPLANT
DRILL QUICK RELEASE 1/8 INCH (DRILL) ×1
DRSG MEPILEX BORDER 4X8 (GAUZE/BANDAGES/DRESSINGS) ×2 IMPLANT
DRSG PAD ABDOMINAL 8X10 ST (GAUZE/BANDAGES/DRESSINGS) ×1 IMPLANT
DURAPREP 26ML APPLICATOR (WOUND CARE) ×2 IMPLANT
ELECT CAUTERY BLADE 6.4 (BLADE) ×2 IMPLANT
ELECT REM PT RETURN 9FT ADLT (ELECTROSURGICAL) ×2
ELECTRODE REM PT RTRN 9FT ADLT (ELECTROSURGICAL) ×1 IMPLANT
GLOVE BIOGEL PI ORTHO PRO SZ8 (GLOVE) ×2
GLOVE ORTHO TXT STRL SZ7.5 (GLOVE) ×2 IMPLANT
GLOVE PI ORTHO PRO STRL SZ8 (GLOVE) ×2 IMPLANT
GLOVE SURG ORTHO 8.0 STRL STRW (GLOVE) ×2 IMPLANT
GOWN STRL REUS W/ TWL XL LVL3 (GOWN DISPOSABLE) ×1 IMPLANT
GOWN STRL REUS W/TWL 2XL LVL3 (GOWN DISPOSABLE) ×2 IMPLANT
GOWN STRL REUS W/TWL XL LVL3 (GOWN DISPOSABLE) ×2
HANDPIECE INTERPULSE COAX TIP (DISPOSABLE) ×2
HOOD PEEL AWAY FACE SHEILD DIS (HOOD) ×2 IMPLANT
HOOD PEEL AWAY FLYTE STAYCOOL (MISCELLANEOUS) ×2 IMPLANT
IMMOBILIZER KNEE 22 (SOFTGOODS) ×2 IMPLANT
KIT BASIN OR (CUSTOM PROCEDURE TRAY) ×2 IMPLANT
KIT TURNOVER KIT B (KITS) ×2 IMPLANT
MANIFOLD NEPTUNE II (INSTRUMENTS) ×2 IMPLANT
NDL 18GX1X1/2 (RX/OR ONLY) (NEEDLE) ×1 IMPLANT
NEEDLE 18GX1X1/2 (RX/OR ONLY) (NEEDLE) ×2 IMPLANT
NS IRRIG 1000ML POUR BTL (IV SOLUTION) ×2 IMPLANT
PACK TOTAL JOINT (CUSTOM PROCEDURE TRAY) ×2 IMPLANT
PAD ARMBOARD 7.5X6 YLW CONV (MISCELLANEOUS) ×4 IMPLANT
PATELLA STD 34X8.5 (Orthopedic Implant) ×1 IMPLANT
PEG FEMORAL DISTAL (Knees) IMPLANT
PLATE KNEE TIBIAL 71MM FIXED (Plate) ×1 IMPLANT
SET HNDPC FAN SPRY TIP SCT (DISPOSABLE) ×1 IMPLANT
SUCTION FRAZIER HANDLE 10FR (MISCELLANEOUS) ×1
SUCTION TUBE FRAZIER 10FR DISP (MISCELLANEOUS) ×1 IMPLANT
SUT VIC AB 0 CT1 27 (SUTURE) ×2
SUT VIC AB 0 CT1 27XBRD ANBCTR (SUTURE) ×1 IMPLANT
SUT VIC AB 2-0 CT1 27 (SUTURE) ×2
SUT VIC AB 2-0 CT1 TAPERPNT 27 (SUTURE) ×1 IMPLANT
SUT VIC AB 3-0 SH 8-18 (SUTURE) ×4 IMPLANT
SYR 30ML LL (SYRINGE) IMPLANT
TOWEL OR 17X26 10 PK STRL BLUE (TOWEL DISPOSABLE) ×2 IMPLANT
TRAY CATH 16FR W/PLASTIC CATH (SET/KITS/TRAYS/PACK) IMPLANT

## 2017-11-29 NOTE — Anesthesia Preprocedure Evaluation (Signed)
Anesthesia Evaluation  Patient identified by MRN, date of birth, ID band Patient awake    Reviewed: Allergy & Precautions, NPO status , Patient's Chart, lab work & pertinent test results  History of Anesthesia Complications (+) PONV  Airway Mallampati: II  TM Distance: >3 FB Neck ROM: Full    Dental  (+) Teeth Intact, Dental Advisory Given   Pulmonary    breath sounds clear to auscultation       Cardiovascular hypertension, Pt. on medications  Rhythm:Regular Rate:Normal     Neuro/Psych  Headaches, Anxiety    GI/Hepatic GERD  ,  Endo/Other  Hypothyroidism   Renal/GU      Musculoskeletal  (+) Arthritis , Osteoarthritis,    Abdominal   Peds  Hematology  (+) anemia ,   Anesthesia Other Findings   Reproductive/Obstetrics                             Anesthesia Physical  Anesthesia Plan  ASA: II  Anesthesia Plan: Regional and Spinal   Post-op Pain Management:  Regional for Post-op pain   Induction: Intravenous  PONV Risk Score and Plan: 3 and Ondansetron, Dexamethasone and Midazolam  Airway Management Planned: Simple Face Mask  Additional Equipment:   Intra-op Plan:   Post-operative Plan:   Informed Consent: I have reviewed the patients History and Physical, chart, labs and discussed the procedure including the risks, benefits and alternatives for the proposed anesthesia with the patient or authorized representative who has indicated his/her understanding and acceptance.   Dental advisory given  Plan Discussed with: CRNA and Anesthesiologist  Anesthesia Plan Comments:         Anesthesia Quick Evaluation

## 2017-11-29 NOTE — Transfer of Care (Signed)
Immediate Anesthesia Transfer of Care Note  Patient: Kathy [name]  Procedure(s) Performed: LEFT TOTAL KNEE ARTHROPLASTY (Left Knee)  Patient Location: PACU  Anesthesia Type:Spinal  Level of Consciousness: awake, alert  and oriented  Airway & Oxygen Therapy: Patient Spontanous Breathing and Patient connected to face mask oxygen  Post-op Assessment: Report given to RN and Post -op Vital signs reviewed and stable  Post vital signs: Reviewed and stable  Last Vitals:  Vitals Value Taken Time  BP    Temp    Pulse 75 11/29/2017 10:45 AM  Resp 11 11/29/2017 10:45 AM  SpO2 100 % 11/29/2017 10:45 AM  Vitals shown include unvalidated device data.  Last Pain:  Vitals:   11/29/17 0601  TempSrc: Oral  PainSc:       Patients Stated Pain Goal: 3 (61/60/73 7106)  Complications: No apparent anesthesia complications

## 2017-11-29 NOTE — Anesthesia Procedure Notes (Signed)
Spinal  Patient location during procedure: OB Start time: 11/29/2017 7:40 AM End time: 11/29/2017 7:45 AM Staffing Anesthesiologist: Lynda Rainwater, MD Performed: anesthesiologist  Preanesthetic Checklist Completed: patient identified, surgical consent, pre-op evaluation, timeout performed, IV checked, risks and benefits discussed and monitors and equipment checked Spinal Block Patient position: sitting Prep: site prepped and draped and DuraPrep Patient monitoring: heart rate, cardiac monitor, continuous pulse ox and blood pressure Approach: midline Location: L3-4 Injection technique: single-shot Needle Needle type: Quincke  Needle gauge: 22 G Needle length: 10 cm Assessment Sensory level: T4

## 2017-11-29 NOTE — Evaluation (Signed)
Physical Therapy Evaluation Patient Details Name: Kathy [name] MRN: [id number] DOB: [birth year] Today's Date: 11/29/2017   History of Present Illness  Pt is a 77 y/o female s/p elective L TKA. PMH includes HTN, bilat THA, and bilat TSA.   Clinical Impression  Pt is s/p surgery above with deficits below. Pt unsteady during gait requiring min guard to min A with use of RW. Reviewed knee precautions and supine HEP. Pt reports she lives alone and plans to go to SNF prior to return home. Will continue to follow acutely to maximize functional mobility independence and safety.     Follow Up Recommendations Follow surgeon's recommendation for DC plan and follow-up therapies;Supervision for mobility/OOB    Equipment Recommendations  None recommended by PT    Recommendations for Other Services       Precautions / Restrictions Precautions Precautions: Knee Precaution Booklet Issued: Yes (comment) Precaution Comments: Reviewed knee precautions and supine HEP.  Restrictions Weight Bearing Restrictions: Yes LLE Weight Bearing: Weight bearing as tolerated      Mobility  Bed Mobility Overal bed mobility: Needs Assistance Bed Mobility: Supine to Sit     Supine to sit: Supervision     General bed mobility comments: Supervision for safety. Increased time required.   Transfers Overall transfer level: Needs assistance Equipment used: Rolling walker (2 wheeled) Transfers: Sit to/from Stand Sit to Stand: Min assist;From elevated surface         General transfer comment: Light min A for steadying assist. Pt wanting to attempt as much as she could on her own. Verbal cues for safe hand placement.   Ambulation/Gait Ambulation/Gait assistance: Min assist;Min guard Ambulation Distance (Feet): 5 Feet Assistive device: Rolling walker (2 wheeled) Gait Pattern/deviations: Step-to pattern;Decreased step length - right;Decreased step length - left;Antalgic;Decreased weight shift to  left Gait velocity: Decreased  Gait velocity interpretation: <1.31 ft/sec, indicative of household ambulator General Gait Details: Slow, unsteady gait, requiring min to min guard A for mobility. Verbal cues for sequencing using RW.   Stairs            Wheelchair Mobility    Modified Rankin (Stroke Patients Only)       Balance Overall balance assessment: Needs assistance Sitting-balance support: No upper extremity supported;Feet supported Sitting balance-Leahy Scale: Good     Standing balance support: Bilateral upper extremity supported;During functional activity Standing balance-Leahy Scale: Poor Standing balance comment: Reliant on BUE support.                              Pertinent Vitals/Pain Pain Assessment: 0-10 Pain Score: 2  Pain Location: L knee  Pain Descriptors / Indicators: Aching;Operative site guarding Pain Intervention(s): Limited activity within patient's tolerance;Monitored during session;Repositioned    Home Living Family/patient expects to be discharged to:: Skilled nursing facility                 Additional Comments: REports she will be going to [place]     Prior Function Level of Independence: Independent with assistive device(s)         Comments: Used cane vs RW for ambulation.      Hand Dominance   Dominant Hand: Right    Extremity/Trunk Assessment   Upper Extremity Assessment Upper Extremity Assessment: Overall WFL for tasks assessed    Lower Extremity Assessment Lower Extremity Assessment: LLE deficits/detail LLE Deficits / Details: Sensory in tact. Deficits consistent with post op pain and weakness. Able to perform  ther ex below.     Cervical / Trunk Assessment Cervical / Trunk Assessment: Normal  Communication   Communication: No difficulties  Cognition Arousal/Alertness: Awake/alert Behavior During Therapy: WFL for tasks assessed/performed Overall Cognitive Status: Within Functional Limits for tasks  assessed                                        General Comments      Exercises Total Joint Exercises Ankle Circles/Pumps: AROM;Both;20 reps Quad Sets: AROM;Left;10 reps Heel Slides: AROM;Left;10 reps(partial range )   Assessment/Plan    PT Assessment Patient needs continued PT services  PT Problem List Decreased strength;Decreased balance;Decreased mobility;Decreased knowledge of use of DME;Decreased knowledge of precautions;Pain       PT Treatment Interventions DME instruction;Gait training;Functional mobility training;Therapeutic exercise;Therapeutic activities;Balance training;Patient/family education    PT Goals (Current goals can be found in the Care Plan section)  Acute Rehab PT Goals Patient Stated Goal: to go to SNF prior to return home  PT Goal Formulation: With patient Time For Goal Achievement: 12/13/17 Potential to Achieve Goals: Good    Frequency 7X/week   Barriers to discharge Decreased caregiver support      Co-evaluation               AM-PAC PT "6 Clicks" Daily Activity  Outcome Measure Difficulty turning over in bed (including adjusting bedclothes, sheets and blankets)?: A Little Difficulty moving from lying on back to sitting on the side of the bed? : A Little Difficulty sitting down on and standing up from a chair with arms (e.g., wheelchair, bedside commode, etc,.)?: Unable Help needed moving to and from a bed to chair (including a wheelchair)?: A Little Help needed walking in hospital room?: A Little Help needed climbing 3-5 steps with a railing? : A Lot 6 Click Score: 15    End of Session Equipment Utilized During Treatment: Gait belt Activity Tolerance: Patient tolerated treatment well Patient left: in chair;with call bell/phone within reach Nurse Communication: Mobility status PT Visit Diagnosis: Unsteadiness on feet (R26.81);Other abnormalities of gait and mobility (R26.89);Pain Pain - Right/Left: Left Pain - part  of body: Knee    Time: 1550-1611 PT Time Calculation (min) (ACUTE ONLY): 21 min   Charges:   PT Evaluation $PT Eval Low Complexity: 1 Low     PT G Codes:        Leighton Ruff, PT, DPT  Acute Rehabilitation Services  Pager: [phone]   Rudean Hitt 11/29/2017, 5:08 PM

## 2017-11-29 NOTE — Discharge Instructions (Signed)
INSTRUCTIONS AFTER JOINT REPLACEMENT  ° °o Remove items at home which could result in a fall. This includes throw rugs or furniture in walking pathways °o ICE to the affected joint every three hours while awake for 30 minutes at a time, for at least the first 3-5 days, and then as needed for pain and swelling.  Continue to use ice for pain and swelling. You may notice swelling that will progress down to the foot and ankle.  This is normal after surgery.  Elevate your leg when you are not up walking on it.   °o Continue to use the breathing machine you got in the hospital (incentive spirometer) which will help keep your temperature down.  It is common for your temperature to cycle up and down following surgery, especially at night when you are not up moving around and exerting yourself.  The breathing machine keeps your lungs expanded and your temperature down. ° ° °DIET:  As you were doing prior to hospitalization, we recommend a well-balanced diet. ° °DRESSING / WOUND CARE / SHOWERING ° °You may change your dressing 3-5 days after surgery.  Then change the dressing every day with sterile gauze.  Please use good hand washing techniques before changing the dressing.  Do not use any lotions or creams on the incision until instructed by your surgeon. ° °ACTIVITY ° °o Increase activity slowly as tolerated, but follow the weight bearing instructions below.   °o No driving for 6 weeks or until further direction given by your physician.  You cannot drive while taking narcotics.  °o No lifting or carrying greater than 10 lbs. until further directed by your surgeon. °o Avoid periods of inactivity such as sitting longer than an hour when not asleep. This helps prevent blood clots.  °o You may return to work once you are authorized by your doctor.  ° ° ° °WEIGHT BEARING  ° °Weight bearing as tolerated with assist device (walker, cane, etc) as directed, use it as long as suggested by your surgeon or therapist, typically at  least 4-6 weeks. ° ° °EXERCISES ° °Results after joint replacement surgery are often greatly improved when you follow the exercise, range of motion and muscle strengthening exercises prescribed by your doctor. Safety measures are also important to protect the joint from further injury. Any time any of these exercises cause you to have increased pain or swelling, decrease what you are doing until you are comfortable again and then slowly increase them. If you have problems or questions, call your caregiver or physical therapist for advice.  ° °Rehabilitation is important following a joint replacement. After just a few days of immobilization, the muscles of the leg can become weakened and shrink (atrophy).  These exercises are designed to build up the tone and strength of the thigh and leg muscles and to improve motion. Often times heat used for twenty to thirty minutes before working out will loosen up your tissues and help with improving the range of motion but do not use heat for the first two weeks following surgery (sometimes heat can increase post-operative swelling).  ° °These exercises can be done on a training (exercise) mat, on the floor, on a table or on a bed. Use whatever works the best and is most comfortable for you.    Use music or television while you are exercising so that the exercises are a pleasant break in your day. This will make your life better with the exercises acting as a break   in your routine that you can look forward to.   Perform all exercises about fifteen times, three times per day or as directed.  You should exercise both the operative leg and the other leg as well. ° °Exercises include: °  °• Quad Sets - Tighten up the muscle on the front of the thigh (Quad) and hold for 5-10 seconds.   °• Straight Leg Raises - With your knee straight (if you were given a brace, keep it on), lift the leg to 60 degrees, hold for 3 seconds, and slowly lower the leg.  Perform this exercise against  resistance later as your leg gets stronger.  °• Leg Slides: Lying on your back, slowly slide your foot toward your buttocks, bending your knee up off the floor (only go as far as is comfortable). Then slowly slide your foot back down until your leg is flat on the floor again.  °• Angel Wings: Lying on your back spread your legs to the side as far apart as you can without causing discomfort.  °• Hamstring Strength:  Lying on your back, push your heel against the floor with your leg straight by tightening up the muscles of your buttocks.  Repeat, but this time bend your knee to a comfortable angle, and push your heel against the floor.  You may put a pillow under the heel to make it more comfortable if necessary.  ° °A rehabilitation program following joint replacement surgery can speed recovery and prevent re-injury in the future due to weakened muscles. Contact your doctor or a physical therapist for more information on knee rehabilitation.  ° ° °CONSTIPATION ° °Constipation is defined medically as fewer than three stools per week and severe constipation as less than one stool per week.  Even if you have a regular bowel pattern at home, your normal regimen is likely to be disrupted due to multiple reasons following surgery.  Combination of anesthesia, postoperative narcotics, change in appetite and fluid intake all can affect your bowels.  ° °YOU MUST use at least one of the following options; they are listed in order of increasing strength to get the job done.  They are all available over the counter, and you may need to use some, POSSIBLY even all of these options:   ° °Drink plenty of fluids (prune juice may be helpful) and high fiber foods °Colace 100 mg by mouth twice a day  °Senokot for constipation as directed and as needed Dulcolax (bisacodyl), take with full glass of water  °Miralax (polyethylene glycol) once or twice a day as needed. ° °If you have tried all these things and are unable to have a bowel  movement in the first 3-4 days after surgery call either your surgeon or your primary doctor.   ° °If you experience loose stools or diarrhea, hold the medications until you stool forms back up.  If your symptoms do not get better within 1 week or if they get worse, check with your doctor.  If you experience "the worst abdominal pain ever" or develop nausea or vomiting, please contact the office immediately for further recommendations for treatment. ° ° °ITCHING:  If you experience itching with your medications, try taking only a single pain pill, or even half a pain pill at a time.  You can also use Benadryl over the counter for itching or also to help with sleep.  ° °TED HOSE STOCKINGS:  Use stockings on both legs until for at least 2 weeks or as   directed by physician office. They may be removed at night for sleeping. ° °MEDICATIONS:  See your medication summary on the “After Visit Summary” that nursing will review with you.  You may have some home medications which will be placed on hold until you complete the course of blood thinner medication.  It is important for you to complete the blood thinner medication as prescribed. ° °PRECAUTIONS:  If you experience chest pain or shortness of breath - call 911 immediately for transfer to the hospital emergency department.  ° °If you develop a fever greater that 101 F, purulent drainage from wound, increased redness or drainage from wound, foul odor from the wound/dressing, or calf pain - CONTACT YOUR SURGEON.   °                                                °FOLLOW-UP APPOINTMENTS:  If you do not already have a post-op appointment, please call the office for an appointment to be seen by your surgeon.  Guidelines for how soon to be seen are listed in your “After Visit Summary”, but are typically between 1-4 weeks after surgery. ° ° °MAKE SURE YOU:  °• Understand these instructions.  °• Get help right away if you are not doing well or get worse.  ° ° °Thank you for  letting us be a part of your medical care team.  It is a privilege we respect greatly.  We hope these instructions will help you stay on track for a fast and full recovery!  ° ° ° °Information on my medicine - XARELTO® (Rivaroxaban) ° °This medication education was reviewed with me or my healthcare representative as part of my discharge preparation.  The pharmacist that spoke with me during my hospital stay was:  Matisse Roskelley Dien, RPH ° °Why was Xarelto® prescribed for you? °Xarelto® was prescribed for you to reduce the risk of blood clots forming after orthopedic surgery. The medical term for these abnormal blood clots is venous thromboembolism (VTE). ° °What do you need to know about xarelto® ? °Take your Xarelto® ONCE DAILY at the same time every day. °You may take it either with or without food. ° °If you have difficulty swallowing the tablet whole, you may crush it and mix in applesauce just prior to taking your dose. ° °Take Xarelto® exactly as prescribed by your doctor and DO NOT stop taking Xarelto® without talking to the doctor who prescribed the medication.  Stopping without other VTE prevention medication to take the place of Xarelto® may increase your risk of developing a clot. ° °After discharge, you should have regular check-up appointments with your healthcare provider that is prescribing your Xarelto®.   ° °What do you do if you miss a dose? °If you miss a dose, take it as soon as you remember on the same day then continue your regularly scheduled once daily regimen the next day. Do not take two doses of Xarelto® on the same day.  ° °Important Safety Information °A possible side effect of Xarelto® is bleeding. You should call your healthcare provider right away if you experience any of the following: °? Bleeding from an injury or your nose that does not stop. °? Unusual colored urine (red or dark brown) or unusual colored stools (red or black). °? Unusual bruising for unknown reasons. °? A serious  fall   or if you hit your head (even if there is no bleeding). ° °Some medicines may interact with Xarelto® and might increase your risk of bleeding while on Xarelto®. To help avoid this, consult your healthcare provider or pharmacist prior to using any new prescription or non-prescription medications, including herbals, vitamins, non-steroidal anti-inflammatory drugs (NSAIDs) and supplements. ° °This website has more information on Xarelto®: www.[email or web address].com. ° ° ° ° °

## 2017-11-29 NOTE — Op Note (Signed)
DATE OF SURGERY:  11/29/2017 TIME: 10:13 AM  PATIENT NAME:  Kathy [name]   AGE: 78 y.o.    PRE-OPERATIVE DIAGNOSIS: Severe left knee valgus osteoarthritis  POST-OPERATIVE DIAGNOSIS:  Same  PROCEDURE: Left total Knee Arthroplasty  SURGEON:  [name], MD   ASSISTANT:  Joya Gaskins, OPA-C, present and scrubbed throughout the case, critical for assistance with exposure, retraction, instrumentation, and closure.  Anesthesia: Spinal with regional block and intracapsular injection of Toradol and Marcaine, 30 mL.  OPERATIVE IMPLANTS: [employer]omet Vanguard Fixed Bearing Posterior Stabilized Femur size 67.5, Tibia size 71, Patella size 34 3-peg oval button, with a 10 mm polyethylene insert.   PREOPERATIVE INDICATIONS:  Kathy [name] is a 77 y.o. year old female with end stage valgus bone on bone degenerative arthritis of the knee who failed conservative treatment, including injections, antiinflammatories, activity modification, and assistive devices, and had significant impairment of their activities of daily living, and elected for Total Knee Arthroplasty.  She had severe loss of motion with flexion preoperatively, she could reach near full extension but only flex to about 70 degrees at most.  The risks, benefits, and alternatives were discussed at length including but not limited to the risks of infection, bleeding, nerve injury, stiffness, blood clots, the need for revision surgery, cardiopulmonary complications, among others, and they were willing to proceed.  OPERATIVE FINDINGS AND UNIQUE ASPECTS OF THE CASE: Bone quality was one of the worst I have encountered.  I first cut the femur at 9 mm, but barely got into the notch, so I cut at 11 mm.  The lateral femoral condyle did not appear to be significantly hypoplastic, but I did cut the posterior condyles at 3 degrees of external rotation which created symmetric flexion gaps.  I cut the tibia twice, in order to gain adequate  extension gap.  There was hypertrophic osteophyte throughout.  I did not have significant osteophyte posteriorly however, its possible some of it was confluent with the posterior shaft, but did not require osteotome posteriorly.  I looked aggressively at that because regaining flexion was extremely difficult.  I did cut the patella first, and used a metal back protector, however during portions of the case the metal-backed protector elongated the drill holes, and the patella was extremely poor bone quality.  I also ended up having to cut the patella twice, the initial patella measured 24 before I cut it, and then measured 16 after the cut.  During the final trialing process, I still had significant loss of flexion, interestingly with the patella everted I regained substantial flexion, I think she carried a lot of her flexion loss in her quadriceps and anterior compartment.  For this reason, I recut the patella during the cementing process removing an additional 2 mm, such that my final patellar thickness was actually 14, with a patella plus component thickness of 24.  This yielded an additional 10 degrees of flexion, although her ultimate range of motion on the table was 0 degrees to 80 degrees.  I rechecked the PCL as well as posterior osteophytes, both of which have been fully removed and released.  I also had already downsized from a 72 a 67.5, although this did not change the flexion gap significantly, as it affected more of the anterior chamfer and anterior flange cut.  She had cystic degeneration both on the femur as well as the tibia, and overall managing her bone quality was extremely high anxiety provoking.  ESTIMATED BLOOD LOSS: 250 mL  OPERATIVE DESCRIPTION:  The patient was brought to the operative room and placed in a supine position.  Anesthesia was administered.  IV antibiotics were given.  The lower extremity was prepped and draped in the usual sterile fashion.  Time out was performed.  The  leg was elevated and exsanguinated and the tourniquet was inflated.  Anterior quadriceps tendon splitting approach was performed.  Her leg had a fairly significant internal rotation contracture, and extreme tightness in flexion.  The patella was everted and osteophytes were removed.  The patella was measured, and then cut, and measured between a size 34 and a 37, although I elected for the 34 in order to slightly lateralized the button to optimize tracking and minimize the risk for overhang.  After drilling the patellar button holes, I placed a metal backed patellar protector.    The anterior horn of the medial and lateral meniscus was removed.   The distal femur was opened with the drill and the intramedullary distal femoral cutting jig was utilized, set at 5 degrees resecting 9 mm off the distal femur, although it did not look like we were getting into the notch, so I resected an additional 2 mm for a total of 11 mm.  Care was taken to protect the collateral ligaments.  Then the extramedullary tibial cutting jig was utilized making the appropriate cut using the anterior tibial crest as a reference building in appropriate posterior slope.  Care was taken during the cut to protect the medial and collateral ligaments.  The proximal tibia was removed along with the posterior horns of the menisci.  The PCL was sacrificed.    The extensor gap was measured and was still tight, approximately 8 mm, so I recut the tibia removing an additional 2 mm, and the gap then measured approximately 78mm.  I took care to avoid using the gap measurement tool aggressively, because her bone quality was such that any abnormal contact pressure would cause defects in the bone.  The distal femoral sizing jig was applied, taking care to avoid notching.  Then the 4-in-1 cutting jig was applied and the anterior and posterior femur was cut, along with the chamfer cuts.  All posterior osteophytes were removed.  The flexion gap was then  measured and may have been slightly tighter than the extension gap.  I assessed the anterior width, and the size 70 femoral component was too wide, so I downsized to a 67.5, although the posterior cuts were "air ball".  I completed the distal femoral preparation using the appropriate jig to prepare the box.  The proximal tibia sized and prepared accordingly with the reamer and the punch, and then all components were trialed with the 33mm poly insert.  The knee had much better motion with the patella everted than it did with the patella in place, the balance felt good both in flexion and extension.    The above named components were then cemented into place and all excess cement was removed.  The real polyethylene implant was placed.  I recut the patella during the cementing process in order to remove an additional 2 mm, taking the final thickness to a 14.  The central holes where the patellar button had been prepared was somewhat poor quality, and I used cement to fill any voids within the patella.  After the cement had cured I released the tourniquet and confirmed excellent hemostasis with no major posterior vessel injury.    The knee was easily taken through a range  of motion and the patella tracked well and the knee irrigated copiously and the parapatellar and subcutaneous tissue closed with vicryl, and monocryl with steri strips for the skin.  The knee dropped to a final flexion angle of about 85 degrees at best, but I do not think there was anything further that I could do in order to improve this.  Some of her soft tissue tension was anteriorly within her quadriceps, and we regained about 20 or 30 degrees of flexion compared to her preoperative status, but she still did not have great motion, although she did reach full extension easily.  The wounds were injected with marcaine, and dressed with sterile gauze and the patient was awakened and returned to the PACU in stable and satisfactory condition.   There were no complications.  Total tourniquet time was 116 minutes.

## 2017-11-29 NOTE — Anesthesia Postprocedure Evaluation (Signed)
Anesthesia Post Note  Patient: Kathy [name]  Procedure(s) Performed: LEFT TOTAL KNEE ARTHROPLASTY (Left Knee)     Patient location during evaluation: PACU Anesthesia Type: Regional and Spinal Level of consciousness: awake and alert Pain management: pain level controlled Vital Signs Assessment: post-procedure vital signs reviewed and stable Respiratory status: spontaneous breathing, nonlabored ventilation and respiratory function stable Cardiovascular status: stable and blood pressure returned to baseline Postop Assessment: no apparent nausea or vomiting Anesthetic complications: no    Last Vitals:  Vitals:   11/29/17 1145 11/29/17 1200  BP: (!) 157/93 (!) 156/87  Pulse: 71 72  Resp: 17 15  Temp:  36.7 C  SpO2: 100% 99%    Last Pain:  Vitals:   11/29/17 1200  TempSrc:   PainSc: [place]

## 2017-11-29 NOTE — H&P (Signed)
PREOPERATIVE H&P  Chief Complaint: Left knee pain  HPI: Tanaysia Bhardwaj Landgren is a 77 y.o. female who presents for preoperative history and physical with a diagnosis of left knee severe valgus osteoarthritis. Symptoms are rated as moderate to severe, and have been worsening.  This is significantly impairing activities of daily living.  She has elected for surgical management.   She has failed injections, activity modification, anti-inflammatories, and assistive devices.  Preoperative X-rays demonstrate end stage degenerative changes with osteophyte formation, loss of joint space, subchondral sclerosis.   Past Medical History:  Diagnosis Date  . Anemia    FROM BLEEDING ULCER  . Anxiety    takes Alprazolam daily as needed  . Arthritis    dx with RA 2017  . Diverticulosis   . GERD (gastroesophageal reflux disease)   . Headache(784.0)    occasionally  . History of blood transfusion    no abnormal reaction noted  . History of bronchitis    last time many yrs ago  . Hyperlipidemia    PT DENIES THIS DX -  ON NO MEDS AND NO ONE HAS TOLD HER  . Hypertension    takes Amlodipine daily  . Hypothyroidism    takes Synthroid daily  . Insomnia   . Joint pain   . 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 Nocturia   . Numbness    occasionally left arm at night  . Osteoporosis    takes Fosamax weekly  . Peripheral edema    takes Lasix daily as needed  . PONV (postoperative nausea and vomiting)   . 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 Stomach ulcer   . Wears glasses   . Wears partial dentures    Past Surgical History:  Procedure Laterality Date  . ABDOMINAL HYSTERECTOMY    . cataract surgery Bilateral   . CHOLECYSTECTOMY    . COLONOSCOPY    . EYE SURGERY     CATARACTS BOTH  . gastric bypass surgery    . JOINT REPLACEMENT Bilateral    hip  . REVERSE SHOULDER ARTHROPLASTY Right 05/11/2016   Procedure: REVERSE SHOULDER ARTHROPLASTY;  Surgeon: Marchia Bond, MD;   Location: [facility];  Service: Orthopedics;  Laterality: Right;  . REVISION TOTAL HIP ARTHROPLASTY Left 10/03/2013   DR Mayer Camel  . TOTAL HIP REVISION Left 10/03/2013   Procedure: TOTAL HIP REVISION- left;  Surgeon: Kerin Salen, MD;  Location: [facility];  Service: Orthopedics;  Laterality: Left;  . TOTAL SHOULDER ARTHROPLASTY Left 11/09/2016   Procedure: TOTAL REVERSE SHOULDER ARTHROPLASTY;  Surgeon: Marchia Bond, MD;  Location: [facility];  Service: Orthopedics;  Laterality: Left;  . TOTAL SHOULDER REPLACEMENT Left 10/2016  . TOTAL THYROIDECTOMY     Social History   Socioeconomic History  . Marital status: Divorced    Spouse name: Not on file  . Number of children: Not on file  . Years of education: Not on file  . Highest education level: Not on file  Occupational History  . Not on file  Social Needs  . Financial resource strain: Not on file  . Food insecurity:    Worry: Not on file    Inability: Not on file  . Transportation needs:    Medical: Not on file    Non-medical: Not on file  Tobacco Use  . Smoking status: Never Smoker  . Smokeless tobacco: Never Used  Substance and Sexual Activity  . Alcohol use: No  . Drug use: No  . Sexual activity: Not  on file  Lifestyle  . Physical activity:    Days per week: Not on file    Minutes per session: Not on file  . Stress: Not on file  Relationships  . Social connections:    Talks on phone: Not on file    Gets together: Not on file    Attends religious service: Not on file    Active member of club or organization: Not on file    Attends meetings of clubs or organizations: Not on file    Relationship status: Not on file  Other Topics Concern  . Not on file  Social History Narrative  . Not on file   Family History  Problem Relation Age of Onset  . Diabetes Mother   . Congestive Heart Failure Mother   . Lung cancer Father   . Thrombosis Sister   . CAD Brother        CABG   Allergies  Allergen Reactions  . Aspirin Nausea And  Vomiting and Other (See Comments)    Adult Aspirin: upset stomach / irritates ulcer    Prior to Admission medications   Medication Sig Start Date End Date Taking? Authorizing Provider  acetaminophen (TYLENOL) 500 MG tablet Take 500 mg by mouth every 6 (six) hours as needed.   Yes [provider]  ALPRAZolam (XANAX) 0.25 MG tablet Take 0.25 mg by mouth at bedtime as needed for anxiety or sleep.   Yes [provider]  aspirin EC 81 MG tablet Take 81 mg by mouth daily.   Yes [provider]  calcium carbonate (OSCAL) 1500 (600 Ca) MG TABS tablet Take 300-600 mg by mouth 3 (three) times daily. Take 1 tablet (600 mg) in the morning, 0.5 tablet (300 mg) at noon, & 1 tablet (600 mg) in the evening.   Yes [provider]  Cholecalciferol 1000 UNITS TBDP Take 1,000 Units by mouth 2 (two) times daily.    Yes [provider]  furosemide (LASIX) 20 MG tablet May take an additional 20 mg daily (in addition to 40 mg daily dose) for leg swelling 12/23/16  Yes Branch, Alphonse Guild, MD  furosemide (LASIX) 40 MG tablet Take 40 mg by mouth every evening.    Yes [provider]  Melatonin ([employer] MELATONIN) 10 MG CAPS Take 10 mg by mouth at bedtime.    Yes [provider]  Multiple Vitamin (MULTIVITAMIN WITH MINERALS) TABS tablet Take 1 tablet by mouth daily.    Yes [provider]  Omega-3 Fatty Acids (FISH OIL) 1000 MG CAPS Take 1,000 mg by mouth daily.   Yes [provider]  omeprazole (PRILOSEC) 40 MG capsule Take 40 mg by mouth daily as needed (for stomach issues (indigestion/upset stomach)).   Yes [provider]  potassium chloride SA (K-DUR,KLOR-CON) 20 MEQ tablet TAKE 2 TABLETS DAILY. Patient taking differently: TAKE 1 TABLET (20 MEQ) BY MOUTH DAILY IF NEEDED WITH ADDITIONAL LASIX (20 MG) DOSE. 02/10/17  Yes Branch, Alphonse Guild, MD  SYNTHROID 75 MCG tablet Take 75 mcg by mouth daily before breakfast.  03/08/16  Yes [provider]  traMADol (ULTRAM) 50 MG tablet Take 50 mg by mouth every 6 (six) hours as needed for moderate pain.    Yes [provider]  traZODone (DESYREL) 50 MG tablet Take 50 mg by mouth at bedtime as needed for sleep ((IF MELATONIN NOT EFFECTIVE)).    Yes [provider]  denosumab (PROLIA) 60 MG/ML SOLN injection Inject 60 mg  into the skin every 6 (six) months. Administer in upper arm, thigh, or abdomen    [provider]  diclofenac sodium (VOLTAREN) 1 % GEL Apply 2 g topically 2 (two) times daily as needed (for pain in knees).     [provider]     Positive ROS: All other systems have been reviewed and were otherwise negative with the exception of those mentioned in the HPI and as above.  Physical Exam: General: Alert, no acute distress Cardiovascular: No pedal edema Respiratory: No cyanosis, no use of accessory musculature GI: No organomegaly, abdomen is soft and non-tender Skin: No lesions in the area of chief complaint Neurologic: Sensation intact distally Psychiatric: Patient is competent for consent with normal mood and affect Lymphatic: No axillary or cervical lymphadenopathy  MUSCULOSKELETAL: Left knee has severe valgus alignment, range of motion from at least 15 degrees flexion contracture up to 100 degrees.  Positive effusion with crepitance and painful arc of motion.  Assessment: Left severe valgus osteoarthritis   Plan: Plan for Procedure(s): LEFT TOTAL KNEE ARTHROPLASTY  The risks benefits and alternatives were discussed with the patient including but not limited to the risks of nonoperative treatment, versus surgical intervention including infection, bleeding, nerve injury,  blood clots, cardiopulmonary complications, morbidity, mortality, among others, and they were willing to proceed.   Anticipated LOS equal to or greater than 2 midnights due to - Age 67 and older with one or more of the following:  - Obesity  -  Expected need for hospital services (PT, OT, Nursing) required for safe  discharge  - Anticipated need for postoperative skilled nursing care or inpatient rehab  -  Past Medical History:  Diagnosis Date  . Anemia    FROM BLEEDING ULCER  . Anxiety    takes Alprazolam daily as needed  . Arthritis    dx with RA 2017  . Diverticulosis   . GERD (gastroesophageal reflux disease)   . Headache(784.0)    occasionally  . History of blood transfusion    no abnormal reaction noted  . History of bronchitis    last time many yrs ago  . Hyperlipidemia    PT DENIES THIS DX -  ON NO MEDS AND NO ONE HAS TOLD HER  . Hypertension    takes Amlodipine daily  . Hypothyroidism    takes Synthroid daily  . Insomnia   . Joint pain   . 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 Nocturia   . Numbness    occasionally left arm at night  . Osteoporosis    takes Fosamax weekly  . Peripheral edema    takes Lasix daily as needed  . PONV (postoperative nausea and vomiting)   . 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 Stomach ulcer   . Wears glasses   . Wears partial dentures      Preoperative templating of the joint replacement has been completed, documented, and submitted to the Operating Room personnel in order to optimize intra-operative equipment management.  [name], MD Cell [phone]   11/29/2017 6:50 AM

## 2017-11-30 ENCOUNTER — Encounter (HOSPITAL_COMMUNITY): Payer: Self-pay | Admitting: Orthopedic Surgery

## 2017-11-30 DIAGNOSIS — Z79890 Hormone replacement therapy (postmenopausal): Secondary | ICD-10-CM | POA: Diagnosis not present

## 2017-11-30 DIAGNOSIS — E785 Hyperlipidemia, unspecified: Secondary | ICD-10-CM | POA: Diagnosis not present

## 2017-11-30 DIAGNOSIS — I10 Essential (primary) hypertension: Secondary | ICD-10-CM | POA: Diagnosis not present

## 2017-11-30 DIAGNOSIS — M810 Age-related osteoporosis without current pathological fracture: Secondary | ICD-10-CM | POA: Diagnosis not present

## 2017-11-30 DIAGNOSIS — Z96611 Presence of right artificial shoulder joint: Secondary | ICD-10-CM | POA: Diagnosis not present

## 2017-11-30 DIAGNOSIS — E669 Obesity, unspecified: Secondary | ICD-10-CM | POA: Diagnosis not present

## 2017-11-30 DIAGNOSIS — D62 Acute posthemorrhagic anemia: Secondary | ICD-10-CM | POA: Diagnosis not present

## 2017-11-30 DIAGNOSIS — Z96643 Presence of artificial hip joint, bilateral: Secondary | ICD-10-CM | POA: Diagnosis not present

## 2017-11-30 DIAGNOSIS — M1712 Unilateral primary osteoarthritis, left knee: Secondary | ICD-10-CM | POA: Diagnosis not present

## 2017-11-30 DIAGNOSIS — K219 Gastro-esophageal reflux disease without esophagitis: Secondary | ICD-10-CM | POA: Diagnosis not present

## 2017-11-30 DIAGNOSIS — Z79899 Other long term (current) drug therapy: Secondary | ICD-10-CM | POA: Diagnosis not present

## 2017-11-30 DIAGNOSIS — M25562 Pain in left knee: Secondary | ICD-10-CM | POA: Diagnosis present

## 2017-11-30 DIAGNOSIS — E890 Postprocedural hypothyroidism: Secondary | ICD-10-CM | POA: Diagnosis not present

## 2017-11-30 DIAGNOSIS — Z7983 Long term (current) use of bisphosphonates: Secondary | ICD-10-CM | POA: Diagnosis not present

## 2017-11-30 DIAGNOSIS — Z8249 Family history of ischemic heart disease and other diseases of the circulatory system: Secondary | ICD-10-CM | POA: Diagnosis not present

## 2017-11-30 DIAGNOSIS — Z7982 Long term (current) use of aspirin: Secondary | ICD-10-CM | POA: Diagnosis not present

## 2017-11-30 DIAGNOSIS — R51 Headache: Secondary | ICD-10-CM | POA: Diagnosis not present

## 2017-11-30 DIAGNOSIS — G4700 Insomnia, unspecified: Secondary | ICD-10-CM | POA: Diagnosis not present

## 2017-11-30 DIAGNOSIS — F419 Anxiety disorder, unspecified: Secondary | ICD-10-CM | POA: Diagnosis not present

## 2017-11-30 LAB — CBC
HCT: 29.9 % — ABNORMAL LOW (ref 36.0–46.0)
Hemoglobin: 9.9 g/dL — ABNORMAL LOW (ref 12.0–15.0)
MCH: 30.8 pg (ref 26.0–34.0)
MCHC: 33.1 g/dL (ref 30.0–36.0)
MCV: 93.1 fL (ref 78.0–100.0)
Platelets: 233 10*3/uL (ref 150–400)
RBC: 3.21 MIL/uL — ABNORMAL LOW (ref 3.87–5.11)
RDW: 14.2 % (ref 11.5–15.5)
WBC: 6.2 10*3/uL (ref 4.0–10.5)

## 2017-11-30 LAB — BASIC METABOLIC PANEL
Anion gap: 9 (ref 5–15)
BUN: 8 mg/dL (ref 6–20)
CO2: 27 mmol/L (ref 22–32)
Calcium: 8.5 mg/dL — ABNORMAL LOW (ref 8.9–10.3)
Chloride: 102 mmol/L (ref 101–111)
Creatinine, Ser: 0.77 mg/dL (ref 0.44–1.00)
GFR calc Af Amer: >60 mL/min (ref >60)
GFR calc non Af Amer: >60 mL/min (ref >60)
Glucose, Bld: 101 mg/dL — ABNORMAL HIGH (ref 65–99)
Potassium: 4.6 mmol/L (ref 3.5–5.1)
Sodium: 138 mmol/L (ref 135–145)

## 2017-11-30 NOTE — Progress Notes (Signed)
Physical Therapy Treatment Patient Details Name: Kathy [name] MRN: [id number] DOB: [birth year] Today's Date: 11/30/2017    History of Present Illness Pt is a 77 y/o female s/p elective L TKA. PMH includes HTN, bilat THA, and bilat TSA.     PT Comments    Pt is making good progress towards her goals today, however is limited in her safe mobility by pain and decreased strength and ROM in L LE. Pt is currently min guard for transfers and minA for ambulation of 40 feet with RW. Pt with knee immobilizer on at entry and pt reports she has been wearing it for ambulation. No order written for KI. Will work with pt in future session to ambulate with out KI to improve knee flexion for swing through given pt not apt to L knee buckling at this point. D/c plans remain appropriate at this time.    Follow Up Recommendations  Follow surgeon's recommendation for DC plan and follow-up therapies;Supervision for mobility/OOB     Equipment Recommendations  None recommended by PT    Recommendations for Other Services       Precautions / Restrictions Precautions Precautions: Knee Precaution Booklet Issued: Yes (comment) Precaution Comments: Reviewed knee precautions and supine HEP.  Restrictions Weight Bearing Restrictions: Yes LLE Weight Bearing: Weight bearing as tolerated    Mobility  Bed Mobility               General bed mobility comments: OOB in recliner with KI on at entry   Transfers Overall transfer level: Needs assistance Equipment used: Rolling walker (2 wheeled) Transfers: Sit to/from Stand Sit to Stand: Min guard         General transfer comment: min guard for safety, pt able to power up and steady from recliner before reaching for RW  Ambulation/Gait Ambulation/Gait assistance: Min assist;Min guard Ambulation Distance (Feet): 40 Feet Assistive device: Rolling walker (2 wheeled) Gait Pattern/deviations: Step-to pattern;Decreased step length - right;Decreased step  length - left;Antalgic;Decreased weight shift to left Gait velocity: Decreased  Gait velocity interpretation: <1.31 ft/sec, indicative of household ambulator General Gait Details: slow, slightly unsteady gait, no overt LoB but min A to maintain balance. vc for RW sequencing       Balance Overall balance assessment: Needs assistance Sitting-balance support: No upper extremity supported;Feet supported Sitting balance-Leahy Scale: Good     Standing balance support: Bilateral upper extremity supported;During functional activity Standing balance-Leahy Scale: Poor Standing balance comment: Reliant on BUE support.                             Cognition Arousal/Alertness: Awake/alert Behavior During Therapy: WFL for tasks assessed/performed Overall Cognitive Status: Within Functional Limits for tasks assessed                                        Exercises Total Joint Exercises Ankle Circles/Pumps: AROM;Both;20 reps Heel Slides: AROM;Left;10 reps;Seated(partial range ) Long Arc Quad: AROM;Left;10 reps;Seated        Pertinent Vitals/Pain Pain Assessment: 0-10 Pain Score: 3  Pain Location: L knee  Pain Descriptors / Indicators: Aching;Operative site guarding           PT Goals (current goals can now be found in the care plan section) Acute Rehab PT Goals Patient Stated Goal: to go to SNF prior to return home  PT Goal Formulation: With  patient Time For Goal Achievement: 12/13/17 Potential to Achieve Goals: Good Progress towards PT goals: Progressing toward goals    Frequency    7X/week      PT Plan Current plan remains appropriate       AM-PAC PT "6 Clicks" Daily Activity  Outcome Measure  Difficulty turning over in bed (including adjusting bedclothes, sheets and blankets)?: A Little Difficulty moving from lying on back to sitting on the side of the bed? : A Little Difficulty sitting down on and standing up from a chair with arms  (e.g., wheelchair, bedside commode, etc,.)?: Unable Help needed moving to and from a bed to chair (including a wheelchair)?: A Little Help needed walking in hospital room?: A Little Help needed climbing 3-5 steps with a railing? : A Lot 6 Click Score: 15    End of Session Equipment Utilized During Treatment: Gait belt Activity Tolerance: Patient tolerated treatment well Patient left: in chair;with call bell/phone within reach Nurse Communication: Mobility status PT Visit Diagnosis: Unsteadiness on feet (R26.81);Other abnormalities of gait and mobility (R26.89);Pain Pain - Right/Left: Left Pain - part of body: Knee     Time: 7026-3785 PT Time Calculation (min) (ACUTE ONLY): 35 min  Charges:  $Gait Training: 8-22 mins $Therapeutic Exercise: 8-22 mins                    G Codes:       Aragon Scarantino B. Migdalia Dk PT, DPT Acute Rehabilitation  [phone] Pager [phone]     [place] 11/30/2017, 2:47 PM

## 2017-11-30 NOTE — Clinical Social Work Note (Signed)
Clinical Social Work Assessment  Patient Details  Name: Kathy [name] MRN: [id number] Date of Birth: Feb 06, 1941  Date of referral:  11/30/17               Reason for consult:  Facility Placement                Permission sought to share information with:  Facility Art therapist granted to share information::  Yes, Verbal Permission Granted  Name::        Agency::  SNF  Relationship::     Contact Information:     Housing/Transportation Living arrangements for the past 2 months:  Single Family Home Source of Information:  Patient Patient Interpreter Needed:  None Criminal Activity/Legal Involvement Pertinent to Current Situation/Hospitalization:  No - Comment as needed Significant Relationships:  Friend, Other Family Members Lives with:  Self Do you feel safe going back to the place where you live?  No Need for family participation in patient care:  No (Coment)  Care giving concerns:  Pt from home alone and will need SNF at discharge.  Social Worker assessment / plan:  CSW met with patient at bedside to discuss clinical recommendations for SNF. Pt agreeable to SNF and has been to SNF in the past. CSW explained SNF process, placement, and insurance auth. CSW obtained verbal permission to send to SNF's and patient has pre-arranged with [facility] and Rehab. CSW will f/u on same.  CSW will complete disposition.  Employment status:  Retired Nurse, adult PT Recommendations:  [place] / Referral to community resources:  [place]  Patient/Family's Response to care:  Patient thanked CSW for meeting to discuss disposition. Pt agreeable to SNF.  Patient/Family's Understanding of and Emotional Response to Diagnosis, Current Treatment, and Prognosis:  Patient has good understanding of impairment and has good emotional understanding of disposition. Pt desires to go to [place] as  she has experienced SNF in the past. Pt was independent with ADL's prior to impairment and used a cane or walker as needed. Pt will return back home to independence. CSw will f/u for disposition.  Emotional Assessment Appearance:  Appears stated age Attitude/Demeanor/Rapport:  (Cooperative) Affect (typically observed):  Accepting, Appropriate Orientation:  Oriented to Situation, Oriented to  Time, Oriented to Place, Oriented to Self Alcohol / Substance use:  Not Applicable Psych involvement (Current and /or in the community):  No (Comment)  Discharge Needs  Concerns to be addressed:  Discharge Planning Concerns Readmission within the last 30 days:  No Current discharge risk:  Physical Impairment, Dependent with Mobility Barriers to Discharge:  No Barriers Identified   Normajean Baxter, LCSW 11/30/2017, 11:59 AM

## 2017-11-30 NOTE — Progress Notes (Addendum)
Patient ID: Kathy [name], female   DOB: 04-Oct-1940, 77 y.o.   MRN: [id number]     Subjective:  Patient reports pain as mild.  Patient in bed and in no acute distress.    Objective:   VITALS:   Vitals:   11/29/17 1241 11/29/17 2001 11/30/17 0047 11/30/17 0441  BP: (!) 153/87 103/65 95/62 118/72  Pulse: 72 77 (!) 42 63  Resp:      Temp: 98.2 F (36.8 C) 99.5 F (37.5 C) 99 F (37.2 C) 98.4 F (36.9 C)  TempSrc: Oral Oral Oral Oral  SpO2: 100% 100% 98% 100%  Weight:      Height:        ABD soft Sensation intact distally Dorsiflexion/Plantar flexion intact Incision: dressing C/D/I and no drainage   Lab Results  Component Value Date   WBC 6.2 11/30/2017   HGB 9.9 (L) 11/30/2017   HCT 29.9 (L) 11/30/2017   MCV 93.1 11/30/2017   PLT 233 11/30/2017   BMET    Component Value Date/Time   NA 138 11/30/2017 0449   NA 143 05/17/2016   K 4.6 11/30/2017 0449   CL 102 11/30/2017 0449   CO2 27 11/30/2017 0449   GLUCOSE 101 (H) 11/30/2017 0449   BUN 8 11/30/2017 0449   BUN 24 (A) 05/17/2016   CREATININE 0.77 11/30/2017 0449   CALCIUM 8.5 (L) 11/30/2017 0449   GFRNONAA >60 11/30/2017 0449   GFRAA >60 11/30/2017 0449     Assessment/Plan: 1 Day Post-Op   Principal Problem:   Primary localized osteoarthritis of left knee   Advance diet Up with therapy WBAT Dry dressing PRN Plan for SNF  Anticipated LOS equal to or greater than 2 midnights due to - Age 52 and older with one or more of the following:    - Expected need for hospital services (PT, OT, Nursing) required for safe  discharge  - Anticipated need for postoperative skilled nursing care or inpatient rehab  - Active co-morbidities: Anemia OR   - Unanticipated findings during/Post Surgery: Slow post-op progression: GI, pain control, mobility  - Patient is a high risk of re-admission due to: None     DOUGLAS PARRY, BRANDON 11/30/2017, 11:32 AM  Patient patient seen and examined, she is up  walking, but only made it 40 feet with assistance.  Using a knee immobilizer, walking gingerly.  EHL is intact.  Plan for skilled nursing facility.  She does not have any support at home.  No safe discharge plan yet.   Marchia Bond, MD Cell [phone]

## 2017-11-30 NOTE — NC FL2 (Signed)
[place] LEVEL OF CARE SCREENING TOOL     IDENTIFICATION  Patient Name: Kathy [name] Birthdate: [birth year] Sex: female Admission Date (Current Location): 11/29/2017  [facility] [place] Number:  [employer] and Address:  [facility] [place], [place] [address], [place],  [address]      Provider Number: [id number]  Attending Physician Name and Address:  Marchia Bond, MD  Relative Name and Phone Number:  Shaila Gilchrest, brother, [phone]    Current Level of Care: Hospital Recommended Level of Care: [place] Prior Approval Number:    Date Approved/Denied:   PASRR Number: [id number]  Discharge Plan: SNF    Current Diagnoses: Patient Active Problem List   Diagnosis Date Noted  . 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/2017  . RA (rheumatoid arthritis) ([place]) 11/16/2016  . 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 Allergic rhinitis 10/19/2013  . GERD (gastroesophageal reflux disease) 10/19/2013  . Anemia 10/19/2013  . Osteoporosis   . Hypertension   . Hypothyroid   . Hyperlipidemia   . Anxiety   . 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    Orientation RESPIRATION BLADDER Height & Weight     Self, Time, Situation, Place  Normal Continent Weight: 130 lb (59 kg) Height:  5\' 1"  (154.9 cm)  BEHAVIORAL SYMPTOMS/MOOD NEUROLOGICAL BOWEL NUTRITION STATUS      Continent Diet(See DC Summary)  AMBULATORY STATUS COMMUNICATION OF NEEDS Skin   Limited Assist Verbally Surgical wounds                       Personal Care Assistance Level of Assistance  Dressing, Feeding, Bathing Bathing Assistance: Limited assistance Feeding assistance: Limited assistance Dressing Assistance: Limited assistance     Functional Limitations Info  Sight, Hearing, Speech Sight Info: Adequate Hearing Info: Adequate Speech  Info: Adequate    SPECIAL CARE FACTORS FREQUENCY  PT (By licensed PT), OT (By licensed OT)     PT Frequency: 7x week OT Frequency: 7x week            Contractures Contractures Info: Not present    Additional Factors Info  Allergies, Code Status Code Status Info: Full Allergies Info: ASPIRIN            Current Medications (11/30/2017):  This is the current hospital active medication list Current Facility-Administered Medications  Medication Dose Route Frequency Provider Last Rate Last Dose  . 0.45 % NaCl with KCl 20 mEq / L infusion   Intravenous Continuous Marchia Bond, MD 75 mL/hr at 11/29/17 1413    . acetaminophen (TYLENOL) tablet 325-650 mg  325-650 mg Oral Q6H PRN Marchia Bond, MD      . ALPRAZolam Duanne Moron) tablet 0.25 mg  0.25 mg Oral QHS PRN Marchia Bond, MD      . alum & mag hydroxide-simeth (MAALOX/MYLANTA) 200-200-20 MG/5ML suspension 30 mL  30 mL Oral Q4H PRN Marchia Bond, MD      . bisacodyl (DULCOLAX) suppository 10 mg  10 mg Rectal Daily PRN Marchia Bond, MD      . calcium carbonate (OS-CAL - dosed in mg of elemental calcium) tablet 500 mg of elemental calcium  500 mg of elemental calcium Oral TID WC Marchia Bond, MD   500 mg of elemental calcium at 11/30/17 0932  . cholecalciferol (VITAMIN D) tablet 1,000 Units  1,000 Units Oral BID Marchia Bond,  MD   1,000 Units at 11/30/17 0933  . diphenhydrAMINE (BENADRYL) 12.5 MG/5ML elixir 12.5-25 mg  12.5-25 mg Oral Q4H PRN Marchia Bond, MD      . docusate sodium (COLACE) capsule 100 mg  100 mg Oral BID Marchia Bond, MD   100 mg at 11/30/17 0933  . furosemide (LASIX) tablet 40 mg  40 mg Oral QPM Marchia Bond, MD   40 mg at 11/29/17 1733  . HYDROcodone-acetaminophen (NORCO) 7.5-325 MG per tablet 1-2 tablet  1-2 tablet Oral Q4H PRN Marchia Bond, MD   2 tablet at 11/30/17 0931  . HYDROcodone-acetaminophen (NORCO/VICODIN) 5-325 MG per tablet 1-2 tablet  1-2 tablet Oral Q4H PRN Marchia Bond, MD      .  levothyroxine (SYNTHROID, LEVOTHROID) tablet 75 mcg  75 mcg Oral QAC breakfast Marchia Bond, MD   75 mcg at 11/30/17 0932  . magnesium citrate solution 1 Bottle  1 Bottle Oral Once PRN Marchia Bond, MD      . Melatonin TABS 9 mg  9 mg Oral QHS Marchia Bond, MD   9 mg at 11/29/17 2300  . menthol-cetylpyridinium (CEPACOL) lozenge 3 mg  1 lozenge Oral PRN Marchia Bond, MD       Or  . phenol (CHLORASEPTIC) mouth spray 1 spray  1 spray Mouth/Throat PRN Marchia Bond, MD      . methocarbamol (ROBAXIN) tablet 500 mg  500 mg Oral Q6H PRN Marchia Bond, MD   500 mg at 11/30/17 1601   Or  . methocarbamol (ROBAXIN) 500 mg in dextrose 5 % 50 mL IVPB  500 mg Intravenous Q6H PRN Marchia Bond, MD      . metoCLOPramide (REGLAN) tablet 5-10 mg  5-10 mg Oral Q8H PRN Marchia Bond, MD       Or  . metoCLOPramide (REGLAN) injection 5-10 mg  5-10 mg Intravenous Q8H PRN Marchia Bond, MD      . morphine 2 MG/ML injection 0.5-1 mg  0.5-1 mg Intravenous Q2H PRN Marchia Bond, MD      . multivitamin with minerals tablet 1 tablet  1 tablet Oral Daily Marchia Bond, MD   1 tablet at 11/30/17 0932  . ondansetron (ZOFRAN) tablet 4 mg  4 mg Oral Q6H PRN Marchia Bond, MD   4 mg at 11/29/17 1419   Or  . ondansetron (ZOFRAN) injection 4 mg  4 mg Intravenous Q6H PRN Marchia Bond, MD      . pantoprazole (PROTONIX) EC tablet 80 mg  80 mg Oral Daily Marchia Bond, MD   80 mg at 11/30/17 0931  . polyethylene glycol (MIRALAX / GLYCOLAX) packet 17 g  17 g Oral Daily PRN Marchia Bond, MD      . potassium chloride SA (K-DUR,KLOR-CON) CR tablet 40 mEq  40 mEq Oral Daily Marchia Bond, MD   40 mEq at 11/30/17 0932  . rivaroxaban (XARELTO) tablet 10 mg  10 mg Oral Q breakfast Marchia Bond, MD   10 mg at 11/30/17 0931  . traZODone (DESYREL) tablet 50 mg  50 mg Oral QHS PRN Marchia Bond, MD         Discharge Medications: Please see discharge summary for a list of discharge medications.  Relevant Imaging  Results:  Relevant Lab Results:   Additional Information SS#:[id number] Hyndman, LCSW

## 2017-12-01 DIAGNOSIS — M6281 Muscle weakness (generalized): Secondary | ICD-10-CM | POA: Diagnosis not present

## 2017-12-01 DIAGNOSIS — M069 Rheumatoid arthritis, unspecified: Secondary | ICD-10-CM | POA: Diagnosis not present

## 2017-12-01 DIAGNOSIS — M25562 Pain in left knee: Secondary | ICD-10-CM | POA: Diagnosis not present

## 2017-12-01 DIAGNOSIS — Z96611 Presence of right artificial shoulder joint: Secondary | ICD-10-CM | POA: Diagnosis not present

## 2017-12-01 DIAGNOSIS — R600 Localized edema: Secondary | ICD-10-CM | POA: Diagnosis not present

## 2017-12-01 DIAGNOSIS — R278 Other lack of coordination: Secondary | ICD-10-CM | POA: Diagnosis not present

## 2017-12-01 DIAGNOSIS — E785 Hyperlipidemia, unspecified: Secondary | ICD-10-CM | POA: Diagnosis not present

## 2017-12-01 DIAGNOSIS — E039 Hypothyroidism, unspecified: Secondary | ICD-10-CM | POA: Diagnosis not present

## 2017-12-01 DIAGNOSIS — Z96652 Presence of left artificial knee joint: Secondary | ICD-10-CM | POA: Diagnosis not present

## 2017-12-01 DIAGNOSIS — M810 Age-related osteoporosis without current pathological fracture: Secondary | ICD-10-CM | POA: Diagnosis not present

## 2017-12-01 DIAGNOSIS — D62 Acute posthemorrhagic anemia: Secondary | ICD-10-CM | POA: Diagnosis not present

## 2017-12-01 DIAGNOSIS — Z8249 Family history of ischemic heart disease and other diseases of the circulatory system: Secondary | ICD-10-CM | POA: Diagnosis not present

## 2017-12-01 DIAGNOSIS — R2689 Other abnormalities of gait and mobility: Secondary | ICD-10-CM | POA: Diagnosis not present

## 2017-12-01 DIAGNOSIS — Z7983 Long term (current) use of bisphosphonates: Secondary | ICD-10-CM | POA: Diagnosis not present

## 2017-12-01 DIAGNOSIS — Z7401 Bed confinement status: Secondary | ICD-10-CM | POA: Diagnosis not present

## 2017-12-01 DIAGNOSIS — R51 Headache: Secondary | ICD-10-CM | POA: Diagnosis not present

## 2017-12-01 DIAGNOSIS — M1712 Unilateral primary osteoarthritis, left knee: Secondary | ICD-10-CM | POA: Diagnosis not present

## 2017-12-01 DIAGNOSIS — G4700 Insomnia, unspecified: Secondary | ICD-10-CM | POA: Diagnosis not present

## 2017-12-01 DIAGNOSIS — I10 Essential (primary) hypertension: Secondary | ICD-10-CM | POA: Diagnosis not present

## 2017-12-01 DIAGNOSIS — Z96643 Presence of artificial hip joint, bilateral: Secondary | ICD-10-CM | POA: Diagnosis not present

## 2017-12-01 DIAGNOSIS — K219 Gastro-esophageal reflux disease without esophagitis: Secondary | ICD-10-CM | POA: Diagnosis not present

## 2017-12-01 DIAGNOSIS — Z79899 Other long term (current) drug therapy: Secondary | ICD-10-CM | POA: Diagnosis not present

## 2017-12-01 DIAGNOSIS — E890 Postprocedural hypothyroidism: Secondary | ICD-10-CM | POA: Diagnosis not present

## 2017-12-01 DIAGNOSIS — Z7982 Long term (current) use of aspirin: Secondary | ICD-10-CM | POA: Diagnosis not present

## 2017-12-01 DIAGNOSIS — M2550 Pain in unspecified joint: Secondary | ICD-10-CM | POA: Diagnosis not present

## 2017-12-01 LAB — CBC
HCT: 24.4 % — ABNORMAL LOW (ref 36.0–46.0)
Hemoglobin: 8.1 g/dL — ABNORMAL LOW (ref 12.0–15.0)
MCH: 30.9 pg (ref 26.0–34.0)
MCHC: 33.2 g/dL (ref 30.0–36.0)
MCV: 93.1 fL (ref 78.0–100.0)
Platelets: 185 10*3/uL (ref 150–400)
RBC: 2.62 MIL/uL — ABNORMAL LOW (ref 3.87–5.11)
RDW: 14.5 % (ref 11.5–15.5)
WBC: 5.2 10*3/uL (ref 4.0–10.5)

## 2017-12-01 NOTE — Discharge Summary (Signed)
Physician Discharge Summary  Patient ID: Kathy [name] MRN: [id number] DOB/AGE: [birth year] 77 y.o.  Admit date: 11/29/2017 Discharge date: 12/01/2017  Admission Diagnoses:  Primary localized osteoarthritis of left knee  Discharge Diagnoses:  Principal Problem:   Primary localized osteoarthritis of left knee ABLA: observed  Past Medical History:  Diagnosis Date  . Anemia    FROM BLEEDING ULCER  . Anxiety    takes Alprazolam daily as needed  . Arthritis    dx with RA 2017  . Diverticulosis   . GERD (gastroesophageal reflux disease)   . Headache(784.0)    occasionally  . History of blood transfusion    no abnormal reaction noted  . History of bronchitis    last time many yrs ago  . Hyperlipidemia    PT DENIES THIS DX -  ON NO MEDS AND NO ONE HAS TOLD HER  . Hypertension    takes Amlodipine daily  . Hypothyroidism    takes Synthroid daily  . Insomnia   . Joint pain   . 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 Nocturia   . Numbness    occasionally left arm at night  . Osteoporosis    takes Fosamax weekly  . Peripheral edema    takes Lasix daily as needed  . PONV (postoperative nausea and vomiting)   . 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/2017  . 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 Stomach ulcer   . Wears glasses   . Wears partial dentures     Surgeries: Procedure(s): LEFT TOTAL KNEE ARTHROPLASTY on 11/29/2017   Consultants (if any):   Discharged Condition: Improved  Hospital Course: Kathy [name] is an 77 y.o. female who was admitted 11/29/2017 with a diagnosis of Primary localized osteoarthritis of left knee and went to the operating room on 11/29/2017 and underwent the above named procedures.    She was given perioperative antibiotics:  Anti-infectives (From admission, onward)   Start     Dose/Rate Route Frequency Ordered Stop   11/29/17 1500  ceFAZolin (ANCEF) IVPB 2g/100 mL premix     2 g 200 mL/hr over 30 Minutes  Intravenous Every 6 hours 11/29/17 1207 11/29/17 2130   11/29/17 0543  ceFAZolin (ANCEF) IVPB 2g/100 mL premix     2 g 200 mL/hr over 30 Minutes Intravenous On call to O.R. 11/29/17 2353 11/29/17 6144    .  She was given sequential compression devices, early ambulation, and xarelto for DVT prophylaxis.  She benefited maximally from the hospital stay and there were no complications.  She was admitted inpatient because she could not ambulate safely adequately on POD 1 and also was anemic and had no home support.  Plan for dc to snf.  Recent vital signs:  Vitals:   11/30/17 2046 12/01/17 0446  BP: 103/84 100/68  Pulse: 76 74  Resp:    Temp: 98.7 F (37.1 C) 99 F (37.2 C)  SpO2: 100% 99%    Recent laboratory studies:  Lab Results  Component Value Date   HGB 8.1 (L) 12/01/2017   HGB 9.9 (L) 11/30/2017   HGB 11.8 (L) 11/18/2017   Lab Results  Component Value Date   WBC 5.2 12/01/2017   PLT 185 12/01/2017   Lab Results  Component Value Date   INR 0.96 09/26/2013   Lab Results  Component Value Date   NA 138 11/30/2017   K 4.6 11/30/2017   CL 102 11/30/2017   CO2 27 11/30/2017   BUN  8 11/30/2017   CREATININE 0.77 11/30/2017   GLUCOSE 101 (H) 11/30/2017    Discharge Medications:   Allergies as of 12/01/2017      Reactions   Aspirin Nausea And Vomiting, Other (See Comments)   Adult Aspirin: upset stomach / irritates ulcer       Medication List    STOP taking these medications   acetaminophen 500 MG tablet Commonly known as:  TYLENOL   aspirin EC 81 MG tablet     TAKE these medications   ALPRAZolam 0.25 MG tablet Commonly known as:  XANAX Take 0.25 mg by mouth at bedtime as needed for anxiety or sleep.   baclofen 10 MG tablet Commonly known as:  LIORESAL Take 1 tablet (10 mg total) by mouth 3 (three) times daily. As needed for muscle spasm   calcium carbonate 1500 (600 Ca) MG Tabs tablet Commonly known as:  OSCAL Take 300-600 mg by mouth 3 (three) times  daily. Take 1 tablet (600 mg) in the morning, 0.5 tablet (300 mg) at noon, & 1 tablet (600 mg) in the evening.   Cholecalciferol 1000 units Tbdp Take 1,000 Units by mouth 2 (two) times daily.   [employer] MELATONIN 10 MG Caps Generic drug:  Melatonin Take 10 mg by mouth at bedtime.   diclofenac sodium 1 % Gel Commonly known as:  VOLTAREN Apply 2 g topically 2 (two) times daily as needed (for pain in knees).   Fish Oil 1000 MG Caps Take 1,000 mg by mouth daily.   furosemide 40 MG tablet Commonly known as:  LASIX Take 40 mg by mouth every evening.   furosemide 20 MG tablet Commonly known as:  LASIX May take an additional 20 mg daily (in addition to 40 mg daily dose) for leg swelling   HYDROcodone-acetaminophen 10-325 MG tablet Commonly known as:  NORCO Take 1 tablet by mouth every 6 (six) hours as needed.   multivitamin with minerals Tabs tablet Take 1 tablet by mouth daily.   omeprazole 40 MG capsule Commonly known as:  PRILOSEC Take 40 mg by mouth daily as needed (for stomach issues (indigestion/upset stomach)).   ondansetron 4 MG tablet Commonly known as:  ZOFRAN Take 1 tablet (4 mg total) by mouth every 8 (eight) hours as needed for nausea or vomiting.   potassium chloride SA 20 MEQ tablet Commonly known as:  K-DUR,KLOR-CON TAKE 2 TABLETS DAILY. What changed:    how much to take  how to take this  when to take this   PROLIA 60 MG/ML Soln injection Generic drug:  denosumab Inject 60 mg into the skin every 6 (six) months. Administer in upper arm, thigh, or abdomen   rivaroxaban 10 MG Tabs tablet Commonly known as:  XARELTO Take 1 tablet (10 mg total) by mouth daily.   sennosides-docusate sodium 8.6-50 MG tablet Commonly known as:  SENOKOT-S Take 2 tablets by mouth daily.   SYNTHROID 75 MCG tablet Generic drug:  levothyroxine Take 75 mcg by mouth daily before breakfast.   traMADol 50 MG tablet Commonly known as:  ULTRAM Take 50 mg by mouth every 6 (six)  hours as needed for moderate pain.   traZODone 50 MG tablet Commonly known as:  DESYREL Take 50 mg by mouth at bedtime as needed for sleep ((IF MELATONIN NOT EFFECTIVE)).       Diagnostic Studies: Dg Knee Left Port  Result Date: 11/29/2017 CLINICAL DATA:  Total knee replacement. EXAM: PORTABLE LEFT KNEE - 1-2 VIEW COMPARISON:  No recent. FINDINGS:  Total left knee replacement. Hardware intact. Anatomic alignment. No acute bony abnormality. IMPRESSION: Total left knee replacement with anatomic alignment. Electronically Signed   By: Marcello Moores  Register   On: 11/29/2017 11:02    Disposition:     Follow-up Information    Marchia Bond, MD. Schedule an appointment as soon as possible for a visit in 2 weeks.   Specialty:  Orthopedic Surgery Contact information: [address] [place] [address] [phone]            Signed: [name] 12/01/2017, 7:05 AM

## 2017-12-01 NOTE — Progress Notes (Signed)
Pt is pleasant, sitting up in the chair. Aware of being discharged to SNF.  Report was given to Juanda Bond at [facility]. Pt is discharged via [facility].

## 2017-12-01 NOTE — Progress Notes (Signed)
     Subjective:  Patient reports pain as mild.  Limited ambulation with PT, only 40 feet.  Objective:   VITALS:   Vitals:   11/30/17 0441 11/30/17 1336 11/30/17 2046 12/01/17 0446  BP: 118/72 117/67 103/84 100/68  Pulse: 63 65 76 74  Resp:      Temp: 98.4 F (36.9 C) 98.6 F (37 C) 98.7 F (37.1 C) 99 F (37.2 C)  TempSrc: Oral Oral Oral Oral  SpO2: 100% 100% 100% 99%  Weight:      Height:        Neurologically intact Sensation intact distally Dorsiflexion/Plantar flexion intact   Lab Results  Component Value Date   WBC 5.2 12/01/2017   HGB 8.1 (L) 12/01/2017   HCT 24.4 (L) 12/01/2017   MCV 93.1 12/01/2017   PLT 185 12/01/2017   BMET    Component Value Date/Time   NA 138 11/30/2017 0449   NA 143 05/17/2016   K 4.6 11/30/2017 0449   CL 102 11/30/2017 0449   CO2 27 11/30/2017 0449   GLUCOSE 101 (H) 11/30/2017 0449   BUN 8 11/30/2017 0449   BUN 24 (A) 05/17/2016   CREATININE 0.77 11/30/2017 0449   CALCIUM 8.5 (L) 11/30/2017 0449   GFRNONAA >60 11/30/2017 0449   GFRAA >60 11/30/2017 0449     Assessment/Plan: 2 Days Post-Op   Principal Problem:   Primary localized osteoarthritis of left knee   Advance diet Up with therapy Discharge to SNF   Anticipated LOS equal to or greater than 2 midnights due to - Age 76 and older with one or more of the following:  - Obesity  - Expected need for hospital services (PT, OT, Nursing) required for safe  discharge  - Anticipated need for postoperative skilled nursing care or inpatient rehab  - Active co-morbidities: Anemia OR   - Unanticipated findings during/Post Surgery: Slow post-op progression: GI, pain control, mobility  - Patient is a high risk of re-admission due to: Barriers to post-acute care (logistical, no family support in home)     Kathy [name] 12/01/2017, 7:07 AM   Marchia Bond, MD Cell [phone]

## 2017-12-01 NOTE — Clinical Social Work Placement (Signed)
   CLINICAL SOCIAL WORK PLACEMENT  NOTE  Date:  12/01/2017  Patient Details  Name: Kathy [name] MRN: [id number] Date of Birth: May 06, 1941  Clinical Social Work is seeking post-discharge placement for this patient at the [place] level of care (*CSW will initial, date and re-position this form in  chart as items are completed):  Yes   Patient/family provided with [facility] Work Department's list of facilities offering this level of care within the geographic area requested by the patient (or if unable, by the patient's family).  Yes   Patient/family informed of their freedom to choose among providers that offer the needed level of care, that participate in Medicare, Medicaid or managed care program needed by the patient, have an available bed and are willing to accept the patient.  Yes   Patient/family informed of [facility]'s ownership interest in [facility] and [facility], as well as of the fact that they are under no obligation to receive care at these facilities.  PASRR submitted to EDS on       PASRR number received on       Existing PASRR number confirmed on 11/30/17     FL2 transmitted to all facilities in geographic area requested by pt/family on       FL2 transmitted to all facilities within larger geographic area on 11/30/17     Patient informed that his/her managed care company has contracts with or will negotiate with certain facilities, including the following:        Yes   Patient/family informed of bed offers received.  Patient chooses bed at [facility]     Physician recommends and patient chooses bed at      Patient to be transferred to [facility] on 12/01/17.  Patient to be transferred to facility by [facility]     Patient family notified on 11/30/17 of transfer.  Name of family member notified:  pt responsible for self     PHYSICIAN       Additional Comment:     _______________________________________________ Normajean Baxter, LCSW 12/01/2017, 10:01 AM

## 2017-12-01 NOTE — Social Work (Signed)
Clinical Social Worker facilitated patient discharge including contacting patient family and facility to confirm patient discharge plans.  Clinical information faxed to facility and family agreeable with plan.    CSW arranged ambulance transport via [facility] to [facility].    RN to call [phone] to give  report prior to discharge. Pt going to Room 303B.  Clinical Social Worker will sign off for now as social work intervention is no longer needed. Please consult us again if new need arises.  Rain Friedt, LCSW Clinical Social Worker [phone]    

## 2017-12-02 DIAGNOSIS — Z96652 Presence of left artificial knee joint: Secondary | ICD-10-CM | POA: Diagnosis not present

## 2017-12-02 DIAGNOSIS — I10 Essential (primary) hypertension: Secondary | ICD-10-CM | POA: Diagnosis not present

## 2017-12-02 DIAGNOSIS — E039 Hypothyroidism, unspecified: Secondary | ICD-10-CM | POA: Diagnosis not present

## 2017-12-02 DIAGNOSIS — M069 Rheumatoid arthritis, unspecified: Secondary | ICD-10-CM | POA: Diagnosis not present

## 2017-12-05 DIAGNOSIS — M25562 Pain in left knee: Secondary | ICD-10-CM | POA: Diagnosis not present

## 2017-12-05 DIAGNOSIS — R2689 Other abnormalities of gait and mobility: Secondary | ICD-10-CM | POA: Diagnosis not present

## 2017-12-07 DIAGNOSIS — M25562 Pain in left knee: Secondary | ICD-10-CM | POA: Diagnosis not present

## 2017-12-07 DIAGNOSIS — R2689 Other abnormalities of gait and mobility: Secondary | ICD-10-CM | POA: Diagnosis not present

## 2017-12-09 DIAGNOSIS — R600 Localized edema: Secondary | ICD-10-CM | POA: Diagnosis not present

## 2017-12-12 DIAGNOSIS — M1712 Unilateral primary osteoarthritis, left knee: Secondary | ICD-10-CM | POA: Diagnosis not present

## 2017-12-14 DIAGNOSIS — M0609 Rheumatoid arthritis without rheumatoid factor, multiple sites: Secondary | ICD-10-CM | POA: Diagnosis not present

## 2017-12-14 DIAGNOSIS — I10 Essential (primary) hypertension: Secondary | ICD-10-CM | POA: Diagnosis not present

## 2017-12-14 DIAGNOSIS — K219 Gastro-esophageal reflux disease without esophagitis: Secondary | ICD-10-CM | POA: Diagnosis not present

## 2017-12-14 DIAGNOSIS — Z96652 Presence of left artificial knee joint: Secondary | ICD-10-CM | POA: Diagnosis not present

## 2017-12-14 DIAGNOSIS — E039 Hypothyroidism, unspecified: Secondary | ICD-10-CM | POA: Diagnosis not present

## 2017-12-14 DIAGNOSIS — Z471 Aftercare following joint replacement surgery: Secondary | ICD-10-CM | POA: Diagnosis not present

## 2017-12-14 DIAGNOSIS — M810 Age-related osteoporosis without current pathological fracture: Secondary | ICD-10-CM | POA: Diagnosis not present

## 2017-12-16 DIAGNOSIS — E039 Hypothyroidism, unspecified: Secondary | ICD-10-CM | POA: Diagnosis not present

## 2017-12-16 DIAGNOSIS — M0609 Rheumatoid arthritis without rheumatoid factor, multiple sites: Secondary | ICD-10-CM | POA: Diagnosis not present

## 2017-12-16 DIAGNOSIS — M810 Age-related osteoporosis without current pathological fracture: Secondary | ICD-10-CM | POA: Diagnosis not present

## 2017-12-16 DIAGNOSIS — Z96652 Presence of left artificial knee joint: Secondary | ICD-10-CM | POA: Diagnosis not present

## 2017-12-16 DIAGNOSIS — Z471 Aftercare following joint replacement surgery: Secondary | ICD-10-CM | POA: Diagnosis not present

## 2017-12-16 DIAGNOSIS — K219 Gastro-esophageal reflux disease without esophagitis: Secondary | ICD-10-CM | POA: Diagnosis not present

## 2017-12-16 DIAGNOSIS — I10 Essential (primary) hypertension: Secondary | ICD-10-CM | POA: Diagnosis not present

## 2017-12-19 DIAGNOSIS — Z96652 Presence of left artificial knee joint: Secondary | ICD-10-CM | POA: Diagnosis not present

## 2017-12-19 DIAGNOSIS — M810 Age-related osteoporosis without current pathological fracture: Secondary | ICD-10-CM | POA: Diagnosis not present

## 2017-12-19 DIAGNOSIS — Z471 Aftercare following joint replacement surgery: Secondary | ICD-10-CM | POA: Diagnosis not present

## 2017-12-19 DIAGNOSIS — E039 Hypothyroidism, unspecified: Secondary | ICD-10-CM | POA: Diagnosis not present

## 2017-12-19 DIAGNOSIS — I10 Essential (primary) hypertension: Secondary | ICD-10-CM | POA: Diagnosis not present

## 2017-12-19 DIAGNOSIS — K219 Gastro-esophageal reflux disease without esophagitis: Secondary | ICD-10-CM | POA: Diagnosis not present

## 2017-12-19 DIAGNOSIS — M0609 Rheumatoid arthritis without rheumatoid factor, multiple sites: Secondary | ICD-10-CM | POA: Diagnosis not present

## 2017-12-21 DIAGNOSIS — K219 Gastro-esophageal reflux disease without esophagitis: Secondary | ICD-10-CM | POA: Diagnosis not present

## 2017-12-21 DIAGNOSIS — E039 Hypothyroidism, unspecified: Secondary | ICD-10-CM | POA: Diagnosis not present

## 2017-12-21 DIAGNOSIS — Z471 Aftercare following joint replacement surgery: Secondary | ICD-10-CM | POA: Diagnosis not present

## 2017-12-21 DIAGNOSIS — M810 Age-related osteoporosis without current pathological fracture: Secondary | ICD-10-CM | POA: Diagnosis not present

## 2017-12-21 DIAGNOSIS — I10 Essential (primary) hypertension: Secondary | ICD-10-CM | POA: Diagnosis not present

## 2017-12-21 DIAGNOSIS — Z96652 Presence of left artificial knee joint: Secondary | ICD-10-CM | POA: Diagnosis not present

## 2017-12-21 DIAGNOSIS — M0609 Rheumatoid arthritis without rheumatoid factor, multiple sites: Secondary | ICD-10-CM | POA: Diagnosis not present

## 2017-12-23 DIAGNOSIS — M0609 Rheumatoid arthritis without rheumatoid factor, multiple sites: Secondary | ICD-10-CM | POA: Diagnosis not present

## 2017-12-23 DIAGNOSIS — Z96652 Presence of left artificial knee joint: Secondary | ICD-10-CM | POA: Diagnosis not present

## 2017-12-23 DIAGNOSIS — M810 Age-related osteoporosis without current pathological fracture: Secondary | ICD-10-CM | POA: Diagnosis not present

## 2017-12-23 DIAGNOSIS — E039 Hypothyroidism, unspecified: Secondary | ICD-10-CM | POA: Diagnosis not present

## 2017-12-23 DIAGNOSIS — K219 Gastro-esophageal reflux disease without esophagitis: Secondary | ICD-10-CM | POA: Diagnosis not present

## 2017-12-23 DIAGNOSIS — Z471 Aftercare following joint replacement surgery: Secondary | ICD-10-CM | POA: Diagnosis not present

## 2017-12-23 DIAGNOSIS — I10 Essential (primary) hypertension: Secondary | ICD-10-CM | POA: Diagnosis not present

## 2017-12-25 DIAGNOSIS — M12512 Traumatic arthropathy, left shoulder: Secondary | ICD-10-CM | POA: Diagnosis not present

## 2017-12-25 DIAGNOSIS — D849 Immunodeficiency, unspecified: Secondary | ICD-10-CM | POA: Diagnosis not present

## 2017-12-25 DIAGNOSIS — E039 Hypothyroidism, unspecified: Secondary | ICD-10-CM | POA: Diagnosis not present

## 2017-12-27 DIAGNOSIS — K219 Gastro-esophageal reflux disease without esophagitis: Secondary | ICD-10-CM | POA: Diagnosis not present

## 2017-12-27 DIAGNOSIS — I10 Essential (primary) hypertension: Secondary | ICD-10-CM | POA: Diagnosis not present

## 2017-12-27 DIAGNOSIS — M0609 Rheumatoid arthritis without rheumatoid factor, multiple sites: Secondary | ICD-10-CM | POA: Diagnosis not present

## 2017-12-27 DIAGNOSIS — Z471 Aftercare following joint replacement surgery: Secondary | ICD-10-CM | POA: Diagnosis not present

## 2017-12-27 DIAGNOSIS — Z96652 Presence of left artificial knee joint: Secondary | ICD-10-CM | POA: Diagnosis not present

## 2017-12-27 DIAGNOSIS — E039 Hypothyroidism, unspecified: Secondary | ICD-10-CM | POA: Diagnosis not present

## 2017-12-27 DIAGNOSIS — M810 Age-related osteoporosis without current pathological fracture: Secondary | ICD-10-CM | POA: Diagnosis not present

## 2017-12-28 DIAGNOSIS — M0609 Rheumatoid arthritis without rheumatoid factor, multiple sites: Secondary | ICD-10-CM | POA: Diagnosis not present

## 2017-12-28 DIAGNOSIS — Z96652 Presence of left artificial knee joint: Secondary | ICD-10-CM | POA: Diagnosis not present

## 2017-12-28 DIAGNOSIS — M810 Age-related osteoporosis without current pathological fracture: Secondary | ICD-10-CM | POA: Diagnosis not present

## 2017-12-28 DIAGNOSIS — I10 Essential (primary) hypertension: Secondary | ICD-10-CM | POA: Diagnosis not present

## 2017-12-28 DIAGNOSIS — E039 Hypothyroidism, unspecified: Secondary | ICD-10-CM | POA: Diagnosis not present

## 2017-12-28 DIAGNOSIS — Z471 Aftercare following joint replacement surgery: Secondary | ICD-10-CM | POA: Diagnosis not present

## 2017-12-28 DIAGNOSIS — K219 Gastro-esophageal reflux disease without esophagitis: Secondary | ICD-10-CM | POA: Diagnosis not present

## 2017-12-30 DIAGNOSIS — Z471 Aftercare following joint replacement surgery: Secondary | ICD-10-CM | POA: Diagnosis not present

## 2017-12-30 DIAGNOSIS — Z9884 Bariatric surgery status: Secondary | ICD-10-CM | POA: Diagnosis not present

## 2017-12-30 DIAGNOSIS — M810 Age-related osteoporosis without current pathological fracture: Secondary | ICD-10-CM | POA: Diagnosis not present

## 2017-12-30 DIAGNOSIS — K219 Gastro-esophageal reflux disease without esophagitis: Secondary | ICD-10-CM | POA: Diagnosis not present

## 2017-12-30 DIAGNOSIS — I10 Essential (primary) hypertension: Secondary | ICD-10-CM | POA: Diagnosis not present

## 2017-12-30 DIAGNOSIS — Z96652 Presence of left artificial knee joint: Secondary | ICD-10-CM | POA: Diagnosis not present

## 2017-12-30 DIAGNOSIS — M0609 Rheumatoid arthritis without rheumatoid factor, multiple sites: Secondary | ICD-10-CM | POA: Diagnosis not present

## 2017-12-30 DIAGNOSIS — E039 Hypothyroidism, unspecified: Secondary | ICD-10-CM | POA: Diagnosis not present

## 2018-01-03 DIAGNOSIS — E039 Hypothyroidism, unspecified: Secondary | ICD-10-CM | POA: Diagnosis not present

## 2018-01-03 DIAGNOSIS — M810 Age-related osteoporosis without current pathological fracture: Secondary | ICD-10-CM | POA: Diagnosis not present

## 2018-01-03 DIAGNOSIS — Z96652 Presence of left artificial knee joint: Secondary | ICD-10-CM | POA: Diagnosis not present

## 2018-01-03 DIAGNOSIS — M0609 Rheumatoid arthritis without rheumatoid factor, multiple sites: Secondary | ICD-10-CM | POA: Diagnosis not present

## 2018-01-03 DIAGNOSIS — Z471 Aftercare following joint replacement surgery: Secondary | ICD-10-CM | POA: Diagnosis not present

## 2018-01-03 DIAGNOSIS — K219 Gastro-esophageal reflux disease without esophagitis: Secondary | ICD-10-CM | POA: Diagnosis not present

## 2018-01-03 DIAGNOSIS — I10 Essential (primary) hypertension: Secondary | ICD-10-CM | POA: Diagnosis not present

## 2018-01-05 DIAGNOSIS — Z96652 Presence of left artificial knee joint: Secondary | ICD-10-CM | POA: Diagnosis not present

## 2018-01-05 DIAGNOSIS — M810 Age-related osteoporosis without current pathological fracture: Secondary | ICD-10-CM | POA: Diagnosis not present

## 2018-01-05 DIAGNOSIS — K219 Gastro-esophageal reflux disease without esophagitis: Secondary | ICD-10-CM | POA: Diagnosis not present

## 2018-01-05 DIAGNOSIS — E039 Hypothyroidism, unspecified: Secondary | ICD-10-CM | POA: Diagnosis not present

## 2018-01-05 DIAGNOSIS — I10 Essential (primary) hypertension: Secondary | ICD-10-CM | POA: Diagnosis not present

## 2018-01-05 DIAGNOSIS — Z471 Aftercare following joint replacement surgery: Secondary | ICD-10-CM | POA: Diagnosis not present

## 2018-01-05 DIAGNOSIS — M0609 Rheumatoid arthritis without rheumatoid factor, multiple sites: Secondary | ICD-10-CM | POA: Diagnosis not present

## 2018-01-09 DIAGNOSIS — K219 Gastro-esophageal reflux disease without esophagitis: Secondary | ICD-10-CM | POA: Diagnosis not present

## 2018-01-09 DIAGNOSIS — M0609 Rheumatoid arthritis without rheumatoid factor, multiple sites: Secondary | ICD-10-CM | POA: Diagnosis not present

## 2018-01-09 DIAGNOSIS — E039 Hypothyroidism, unspecified: Secondary | ICD-10-CM | POA: Diagnosis not present

## 2018-01-09 DIAGNOSIS — I10 Essential (primary) hypertension: Secondary | ICD-10-CM | POA: Diagnosis not present

## 2018-01-09 DIAGNOSIS — Z96652 Presence of left artificial knee joint: Secondary | ICD-10-CM | POA: Diagnosis not present

## 2018-01-09 DIAGNOSIS — Z471 Aftercare following joint replacement surgery: Secondary | ICD-10-CM | POA: Diagnosis not present

## 2018-01-09 DIAGNOSIS — M810 Age-related osteoporosis without current pathological fracture: Secondary | ICD-10-CM | POA: Diagnosis not present

## 2018-01-12 DIAGNOSIS — K219 Gastro-esophageal reflux disease without esophagitis: Secondary | ICD-10-CM | POA: Diagnosis not present

## 2018-01-12 DIAGNOSIS — Z471 Aftercare following joint replacement surgery: Secondary | ICD-10-CM | POA: Diagnosis not present

## 2018-01-12 DIAGNOSIS — E039 Hypothyroidism, unspecified: Secondary | ICD-10-CM | POA: Diagnosis not present

## 2018-01-12 DIAGNOSIS — I10 Essential (primary) hypertension: Secondary | ICD-10-CM | POA: Diagnosis not present

## 2018-01-12 DIAGNOSIS — M810 Age-related osteoporosis without current pathological fracture: Secondary | ICD-10-CM | POA: Diagnosis not present

## 2018-01-12 DIAGNOSIS — Z96652 Presence of left artificial knee joint: Secondary | ICD-10-CM | POA: Diagnosis not present

## 2018-01-12 DIAGNOSIS — M0609 Rheumatoid arthritis without rheumatoid factor, multiple sites: Secondary | ICD-10-CM | POA: Diagnosis not present

## 2018-01-25 DIAGNOSIS — D849 Immunodeficiency, unspecified: Secondary | ICD-10-CM | POA: Diagnosis not present

## 2018-01-25 DIAGNOSIS — M12512 Traumatic arthropathy, left shoulder: Secondary | ICD-10-CM | POA: Diagnosis not present

## 2018-01-25 DIAGNOSIS — E039 Hypothyroidism, unspecified: Secondary | ICD-10-CM | POA: Diagnosis not present

## 2018-02-08 DIAGNOSIS — Z96652 Presence of left artificial knee joint: Secondary | ICD-10-CM | POA: Diagnosis not present

## 2018-02-23 DIAGNOSIS — M810 Age-related osteoporosis without current pathological fracture: Secondary | ICD-10-CM | POA: Diagnosis not present

## 2018-02-24 DIAGNOSIS — E039 Hypothyroidism, unspecified: Secondary | ICD-10-CM | POA: Diagnosis not present

## 2018-02-24 DIAGNOSIS — M12512 Traumatic arthropathy, left shoulder: Secondary | ICD-10-CM | POA: Diagnosis not present

## 2018-02-24 DIAGNOSIS — D849 Immunodeficiency, unspecified: Secondary | ICD-10-CM | POA: Diagnosis not present

## 2018-03-24 DIAGNOSIS — K279 Peptic ulcer, site unspecified, unspecified as acute or chronic, without hemorrhage or perforation: Secondary | ICD-10-CM | POA: Diagnosis not present

## 2018-03-25 IMAGING — CR DG SHOULDER 1V*L*
1 series · 1 of 1 positions shown · non-contrast
Comparison: 01/20/2016

CLINICAL DATA: Status post shoulder replacement.

EXAM:
LEFT SHOULDER - 1 VIEW

[AP]
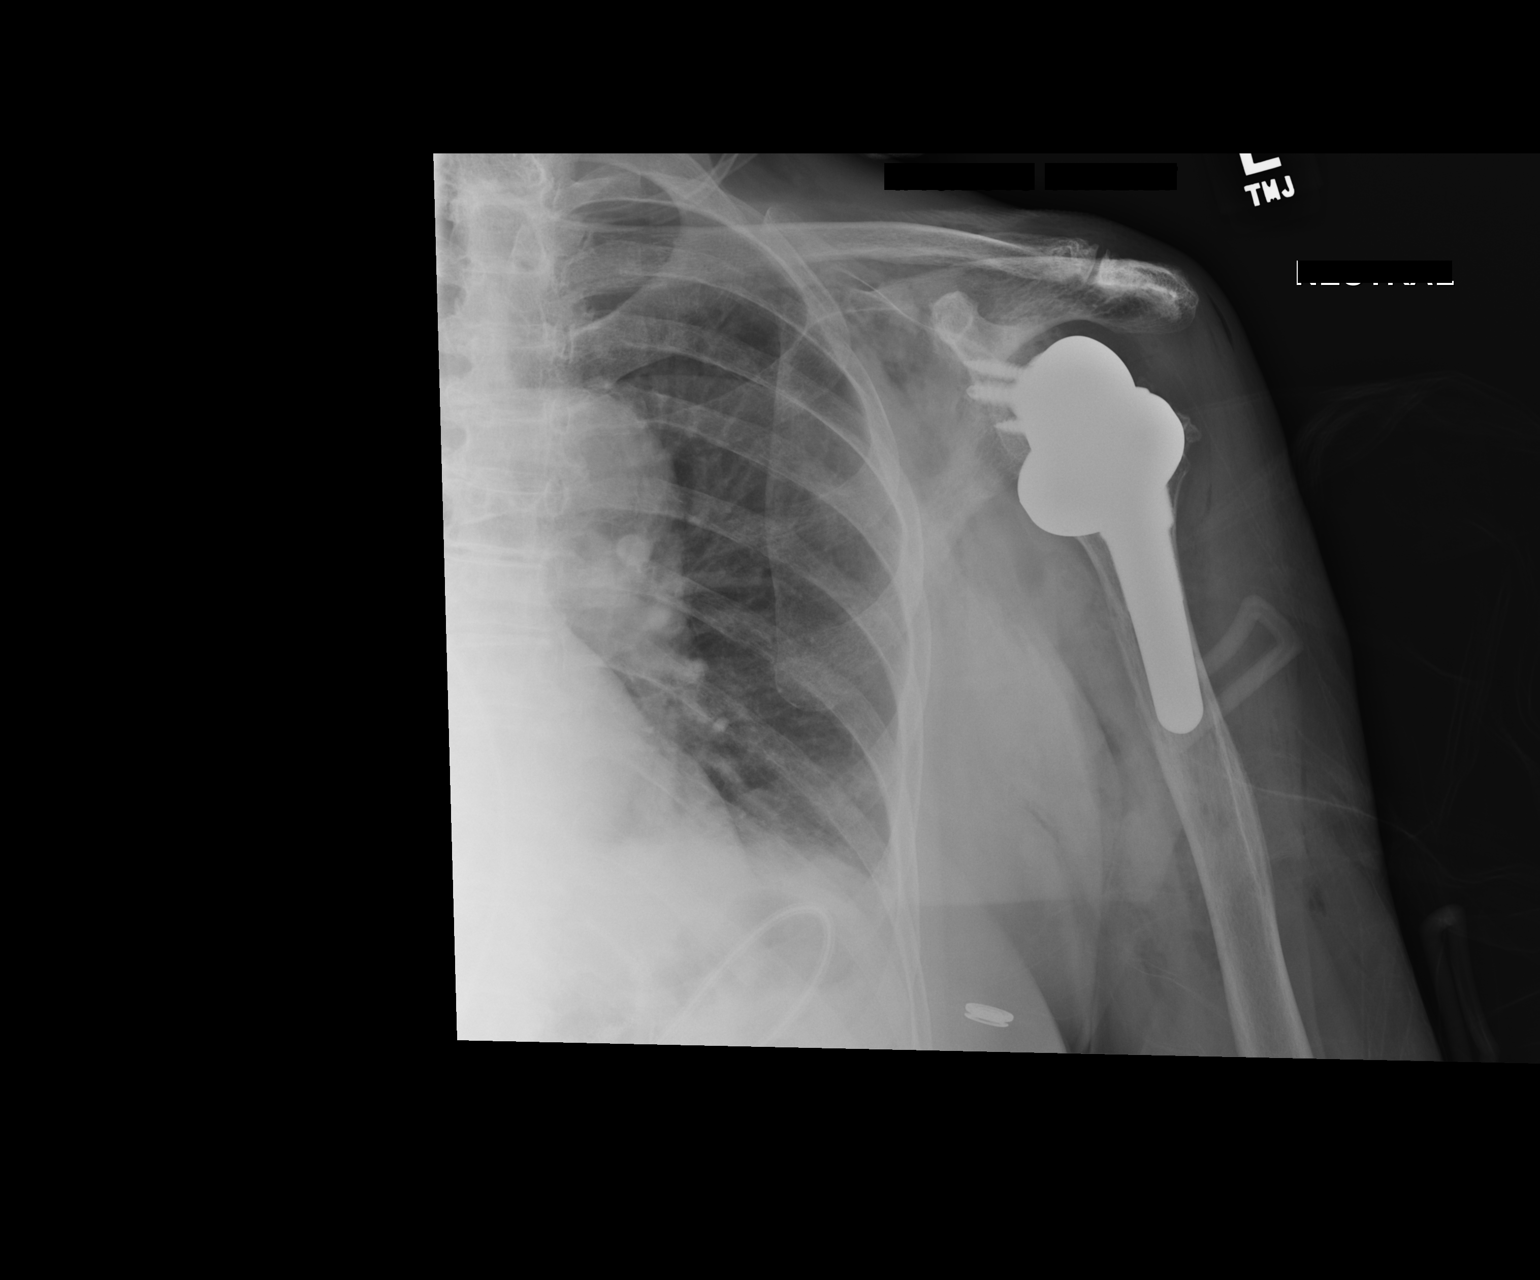

[1 of 1 positions shown; findings below may reference images not displayed]

FINDINGS: Reverse glenohumeral arthroplasty on the left. Expected soft tissue
gas. Prosthesis is in unremarkable location. No evidence of
periprosthetic fracture.
IMPRESSION: No acute finding after reverse glenohumeral arthroplasty on the
left.

## 2018-03-30 DIAGNOSIS — Z9884 Bariatric surgery status: Secondary | ICD-10-CM | POA: Diagnosis not present

## 2018-03-30 DIAGNOSIS — I10 Essential (primary) hypertension: Secondary | ICD-10-CM | POA: Diagnosis not present

## 2018-04-20 ENCOUNTER — Ambulatory Visit: Payer: Medicare Other | Admitting: Cardiology

## 2018-04-20 ENCOUNTER — Encounter: Payer: Self-pay | Admitting: Cardiology

## 2018-04-20 VITALS — BP 114/68 | HR 57 | Ht 61.0 in | Wt 133.0 lb

## 2018-04-20 DIAGNOSIS — M7989 Other specified soft tissue disorders: Secondary | ICD-10-CM

## 2018-04-20 DIAGNOSIS — R002 Palpitations: Secondary | ICD-10-CM | POA: Diagnosis not present

## 2018-04-20 NOTE — Progress Notes (Signed)
Clinical Summary Kathy [name] is a 77 y.o.female seen today for follow up of the following medical problems.      1. Leg edema - echo 11/2016 LVEF 55-60%, no WMAs, grade I diastolic dysfunction, PASP 20, mild RV dilatation - norvasc stopped by pcp    - swelling is doing well, mild at time. Takes lasix 40mg  daily, will take additional 20mg  as needed.    2. Palpitations - no recent symptoms    Past Medical History:  Diagnosis Date  . Anemia    FROM BLEEDING ULCER  . Anxiety    takes Alprazolam daily as needed  . Arthritis    dx with RA 2017  . Diverticulosis   . GERD (gastroesophageal reflux disease)   . Headache(784.0)    occasionally  . History of blood transfusion    no abnormal reaction noted  . History of bronchitis    last time many yrs ago  . Hyperlipidemia    PT DENIES THIS DX -  ON NO MEDS AND NO ONE HAS TOLD HER  . Hypertension    takes Amlodipine daily  . Hypothyroidism    takes Synthroid daily  . Insomnia   . Joint pain   . 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 Nocturia   . Numbness    occasionally left arm at night  . Osteoporosis    takes Fosamax weekly  . Peripheral edema    takes Lasix daily as needed  . PONV (postoperative nausea and vomiting)   . 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/2017  . 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 Stomach ulcer   . Wears glasses   . Wears partial dentures      Allergies  Allergen Reactions  . Aspirin Nausea And Vomiting and Other (See Comments)    Adult Aspirin: upset stomach / irritates ulcer      Current Outpatient Medications  Medication Sig Dispense Refill  . ALPRAZolam (XANAX) 0.25 MG tablet Take 0.25 mg by mouth at bedtime as needed for anxiety or sleep.    . baclofen (LIORESAL) 10 MG tablet Take 1 tablet (10 mg total) by mouth 3 (three) times daily. As needed for muscle spasm 50 tablet 0  . calcium carbonate (OSCAL) 1500 (600 Ca) MG TABS tablet Take  300-600 mg by mouth 3 (three) times daily. Take 1 tablet (600 mg) in the morning, 0.5 tablet (300 mg) at noon, & 1 tablet (600 mg) in the evening.    . Cholecalciferol 1000 UNITS TBDP Take 1,000 Units by mouth 2 (two) times daily.     Marland Kitchen denosumab (PROLIA) 60 MG/ML SOLN injection Inject 60 mg into the skin every 6 (six) months. Administer in upper arm, thigh, or abdomen    . diclofenac sodium (VOLTAREN) 1 % GEL Apply 2 g topically 2 (two) times daily as needed (for pain in knees).     . furosemide (LASIX) 20 MG tablet May take an additional 20 mg daily (in addition to 40 mg daily dose) for leg swelling 90 tablet 3  . furosemide (LASIX) 40 MG tablet Take 40 mg by mouth every evening.     Marland Kitchen HYDROcodone-acetaminophen (NORCO) 10-325 MG tablet Take 1 tablet by mouth every 6 (six) hours as needed. 28 tablet 0  . Melatonin (CVS MELATONIN) 10 MG CAPS Take 10 mg by mouth at bedtime.     . Multiple Vitamin (MULTIVITAMIN WITH MINERALS) TABS tablet Take 1 tablet by mouth daily.     Marland Kitchen  Omega-3 Fatty Acids (FISH OIL) 1000 MG CAPS Take 1,000 mg by mouth daily.    Marland Kitchen omeprazole (PRILOSEC) 40 MG capsule Take 40 mg by mouth daily as needed (for stomach issues (indigestion/upset stomach)).    . ondansetron (ZOFRAN) 4 MG tablet Take 1 tablet (4 mg total) by mouth every 8 (eight) hours as needed for nausea or vomiting. 10 tablet 0  . potassium chloride SA (K-DUR,KLOR-CON) 20 MEQ tablet TAKE 2 TABLETS DAILY. (Patient taking differently: TAKE 1 TABLET (20 MEQ) BY MOUTH DAILY IF NEEDED WITH ADDITIONAL LASIX (20 MG) DOSE.) 60 tablet 6  . rivaroxaban (XARELTO) 10 MG TABS tablet Take 1 tablet (10 mg total) by mouth daily. 30 tablet 0  . sennosides-docusate sodium (SENOKOT-S) 8.6-50 MG tablet Take 2 tablets by mouth daily. 30 tablet 1  . SYNTHROID 75 MCG tablet Take 75 mcg by mouth daily before breakfast.     . traMADol (ULTRAM) 50 MG tablet Take 50 mg by mouth every 6 (six) hours as needed for moderate pain.     . traZODone  (DESYREL) 50 MG tablet Take 50 mg by mouth at bedtime as needed for sleep ((IF MELATONIN NOT EFFECTIVE)).      No current facility-administered medications for this visit.      Past Surgical History:  Procedure Laterality Date  . ABDOMINAL HYSTERECTOMY    . cataract surgery Bilateral   . CHOLECYSTECTOMY    . COLONOSCOPY    . EYE SURGERY     CATARACTS BOTH  . gastric bypass surgery    . JOINT REPLACEMENT Bilateral    hip  . REVERSE SHOULDER ARTHROPLASTY Right 05/11/2016   Procedure: REVERSE SHOULDER ARTHROPLASTY;  Surgeon: Marchia Bond, MD;  Location: [facility];  Service: Orthopedics;  Laterality: Right;  . REVISION TOTAL HIP ARTHROPLASTY Left 10/03/2013   DR Mayer Camel  . TOTAL HIP REVISION Left 10/03/2013   Procedure: TOTAL HIP REVISION- left;  Surgeon: Kerin Salen, MD;  Location: [facility];  Service: Orthopedics;  Laterality: Left;  . TOTAL KNEE ARTHROPLASTY Left 11/29/2017   Procedure: LEFT TOTAL KNEE ARTHROPLASTY;  Surgeon: Marchia Bond, MD;  Location: [facility];  Service: Orthopedics;  Laterality: Left;  . TOTAL SHOULDER ARTHROPLASTY Left 11/09/2016   Procedure: TOTAL REVERSE SHOULDER ARTHROPLASTY;  Surgeon: Marchia Bond, MD;  Location: [facility];  Service: Orthopedics;  Laterality: Left;  . TOTAL SHOULDER REPLACEMENT Left 10/2016  . TOTAL THYROIDECTOMY       Allergies  Allergen Reactions  . Aspirin Nausea And Vomiting and Other (See Comments)    Adult Aspirin: upset stomach / irritates ulcer       Family History  Problem Relation Age of Onset  . Diabetes Mother   . Congestive Heart Failure Mother   . Lung cancer Father   . Thrombosis Sister   . CAD Brother        CABG     Social History Ms. Freund reports that she has never smoked. She has never used smokeless tobacco. Ms. Helin reports that she does not drink alcohol.   Review of Systems CONSTITUTIONAL: No weight loss, fever, chills, weakness or fatigue.  HEENT: Eyes: No visual loss, blurred vision, double vision or  yellow sclerae.No hearing loss, sneezing, congestion, runny nose or sore throat.  SKIN: No rash or itching.  CARDIOVASCULAR: per hpi RESPIRATORY: No shortness of breath, cough or sputum.  GASTROINTESTINAL: No anorexia, nausea, vomiting or diarrhea. No abdominal pain or blood.  GENITOURINARY: No burning on urination, no polyuria NEUROLOGICAL: No headache,  dizziness, syncope, paralysis, ataxia, numbness or tingling in the extremities. No change in bowel or bladder control.  MUSCULOSKELETAL: No muscle, back pain, joint pain or stiffness.  LYMPHATICS: No enlarged nodes. No history of splenectomy.  PSYCHIATRIC: No history of depression or anxiety.  ENDOCRINOLOGIC: No reports of sweating, cold or heat intolerance. No polyuria or polydipsia.  Marland Kitchen   Physical Examination Vitals:   04/20/18 1050  BP: 114/68  Pulse: (!) 57  SpO2: 97%   Vitals:   04/20/18 1050  Weight: 133 lb (60.3 kg)  Height: 5\' 1"  (1.549 m)    Gen: resting comfortably, no acute distress HEENT: no scleral icterus, pupils equal round and reactive, no palptable cervical adenopathy,  CV: RRR, no m/r/g, no jvd Resp: Clear to auscultation bilaterally GI: abdomen is soft, non-tender, non-distended, normal bowel sounds, no hepatosplenomegaly MSK: extremities are warm, no edema.  Skin: warm, no rash Neuro:  no focal deficits Psych: appropriate affect   Diagnostic Studies 11/2016 echo Study Conclusions  - Left ventricle: The cavity size was normal. Wall thickness was normal. Systolic function was normal. The estimated ejection fraction was in the range of 55% to 60%. Wall motion was normal; there were no regional wall motion abnormalities. Doppler parameters are consistent with abnormal left ventricular relaxation (grade 1 diastolic dysfunction). - Aortic valve: Mildly calcified annulus. Trileaflet. - Mitral valve: There was trivial regurgitation. - Right ventricle: The cavity size was mildly dilated. - Right  atrium: The atrium was at the upper limits of normal in size. - Atrial septum: No defect or patent foramen ovale was identified. - Tricuspid valve: There was trivial regurgitation. - Pulmonary arteries: PA peak pressure: 20 mm Hg (S). - Pericardium, extracardiac: A small pericardial effusion was identified circumferential to the heart.  Impressions:  - Normal LV wall thickness with LVEF 55-60% and grade 1 diastolic dysfunction. Trivial mitral regurgitation. Mildly calcified aortic annulus. Mild dilated right ventricle and upper normal right atrial chamber size. Trivial tricuspid regurgitation with PASP estimated 20 mmHg. Small circumferential pericardial effusion.    Assessment and Plan  1. LE edema - echo with mild LV diastolic dysfunction, may be playing some role. Likely some component of venous insufficiency as well - doing well, continue current meds  2. Palpitations - no recent symptoms, continue to monitor   F/u as needed   Arnoldo Lenis, M.D.

## 2018-04-20 NOTE — Patient Instructions (Signed)
Medication Instructions:  Your physician recommends that you continue on your current medications as directed. Please refer to the Current Medication list given to you today.   Labwork: NONE  Testing/Procedures: NONE  Follow-Up: Your physician recommends that you schedule a follow-up appointment in: AS NEEDED      Any Other Special Instructions Will Be Listed Below (If Applicable).     If you need a refill on your cardiac medications before your next appointment, please call your pharmacy.   

## 2018-05-03 DIAGNOSIS — R1013 Epigastric pain: Secondary | ICD-10-CM | POA: Diagnosis not present

## 2018-05-03 DIAGNOSIS — Z9884 Bariatric surgery status: Secondary | ICD-10-CM | POA: Diagnosis not present

## 2018-05-17 DIAGNOSIS — Z96652 Presence of left artificial knee joint: Secondary | ICD-10-CM | POA: Diagnosis not present

## 2018-05-29 ENCOUNTER — Ambulatory Visit (INDEPENDENT_AMBULATORY_CARE_PROVIDER_SITE_OTHER): Payer: Medicare Other | Admitting: Otolaryngology

## 2018-05-29 DIAGNOSIS — H903 Sensorineural hearing loss, bilateral: Secondary | ICD-10-CM

## 2018-05-29 DIAGNOSIS — H6121 Impacted cerumen, right ear: Secondary | ICD-10-CM

## 2018-06-27 DIAGNOSIS — R600 Localized edema: Secondary | ICD-10-CM | POA: Diagnosis not present

## 2018-06-27 DIAGNOSIS — Z9884 Bariatric surgery status: Secondary | ICD-10-CM | POA: Diagnosis not present

## 2018-06-27 DIAGNOSIS — I10 Essential (primary) hypertension: Secondary | ICD-10-CM | POA: Diagnosis not present

## 2018-07-07 DIAGNOSIS — M899 Disorder of bone, unspecified: Secondary | ICD-10-CM | POA: Diagnosis not present

## 2018-07-07 DIAGNOSIS — C73 Malignant neoplasm of thyroid gland: Secondary | ICD-10-CM | POA: Diagnosis not present

## 2018-07-07 DIAGNOSIS — E890 Postprocedural hypothyroidism: Secondary | ICD-10-CM | POA: Diagnosis not present

## 2018-07-07 DIAGNOSIS — E892 Postprocedural hypoparathyroidism: Secondary | ICD-10-CM | POA: Diagnosis not present

## 2018-07-07 DIAGNOSIS — M069 Rheumatoid arthritis, unspecified: Secondary | ICD-10-CM | POA: Diagnosis not present

## 2018-07-07 LAB — TSH: TSH: 0.2 — AB (ref <=5.90)

## 2018-07-10 DIAGNOSIS — E892 Postprocedural hypoparathyroidism: Secondary | ICD-10-CM | POA: Diagnosis not present

## 2018-07-10 DIAGNOSIS — D559 Anemia due to enzyme disorder, unspecified: Secondary | ICD-10-CM | POA: Diagnosis not present

## 2018-08-03 DIAGNOSIS — M19041 Primary osteoarthritis, right hand: Secondary | ICD-10-CM | POA: Diagnosis not present

## 2018-08-03 DIAGNOSIS — M79642 Pain in left hand: Secondary | ICD-10-CM | POA: Diagnosis not present

## 2018-08-03 DIAGNOSIS — M79641 Pain in right hand: Secondary | ICD-10-CM | POA: Diagnosis not present

## 2018-08-03 DIAGNOSIS — M069 Rheumatoid arthritis, unspecified: Secondary | ICD-10-CM | POA: Diagnosis not present

## 2018-08-03 DIAGNOSIS — M19042 Primary osteoarthritis, left hand: Secondary | ICD-10-CM | POA: Diagnosis not present

## 2018-08-31 MED FILL — PROLIA 60 MG/ML SOLN: 60 | 180 days supply | Qty: 1 | Fill #0

## 2018-09-04 ENCOUNTER — Other Ambulatory Visit (HOSPITAL_COMMUNITY): Payer: Self-pay | Admitting: Pulmonary Disease

## 2018-09-04 DIAGNOSIS — Z1231 Encounter for screening mammogram for malignant neoplasm of breast: Secondary | ICD-10-CM

## 2018-09-05 DIAGNOSIS — M810 Age-related osteoporosis without current pathological fracture: Secondary | ICD-10-CM | POA: Diagnosis not present

## 2018-09-21 ENCOUNTER — Ambulatory Visit (HOSPITAL_COMMUNITY): Payer: Medicare Other

## 2018-09-27 DIAGNOSIS — I10 Essential (primary) hypertension: Secondary | ICD-10-CM | POA: Diagnosis not present

## 2018-09-27 DIAGNOSIS — M069 Rheumatoid arthritis, unspecified: Secondary | ICD-10-CM | POA: Diagnosis not present

## 2018-09-27 DIAGNOSIS — E039 Hypothyroidism, unspecified: Secondary | ICD-10-CM | POA: Diagnosis not present

## 2018-09-27 DIAGNOSIS — Z9884 Bariatric surgery status: Secondary | ICD-10-CM | POA: Diagnosis not present

## 2018-09-28 ENCOUNTER — Ambulatory Visit (HOSPITAL_COMMUNITY)
Admission: RE | Admit: 2018-09-28 | Discharge: 2018-09-28 | Disposition: A | Discharge status: Home / Self Care | Payer: Medicare Other | Source: Ambulatory Visit | Attending: Pulmonary Disease | Admitting: Pulmonary Disease

## 2018-09-28 DIAGNOSIS — Z1231 Encounter for screening mammogram for malignant neoplasm of breast: Secondary | ICD-10-CM

## 2018-09-28 LAB — HM MAMMOGRAPHY

## 2018-12-27 DIAGNOSIS — M069 Rheumatoid arthritis, unspecified: Secondary | ICD-10-CM | POA: Diagnosis not present

## 2018-12-27 DIAGNOSIS — Z9884 Bariatric surgery status: Secondary | ICD-10-CM | POA: Diagnosis not present

## 2018-12-27 DIAGNOSIS — I10 Essential (primary) hypertension: Secondary | ICD-10-CM | POA: Diagnosis not present

## 2018-12-27 DIAGNOSIS — E039 Hypothyroidism, unspecified: Secondary | ICD-10-CM | POA: Diagnosis not present

## 2019-01-08 DIAGNOSIS — I10 Essential (primary) hypertension: Secondary | ICD-10-CM | POA: Diagnosis not present

## 2019-01-08 DIAGNOSIS — M1990 Unspecified osteoarthritis, unspecified site: Secondary | ICD-10-CM | POA: Diagnosis not present

## 2019-01-08 DIAGNOSIS — E039 Hypothyroidism, unspecified: Secondary | ICD-10-CM | POA: Diagnosis not present

## 2019-01-08 LAB — BASIC METABOLIC PANEL
BUN: 16 (ref 4–21)
CO2: 33 — AB (ref 13–22)
Chloride: 97 — AB (ref 99–108)
Creatinine: 0.9 (ref <=1.1)
Glucose: 84
Potassium: 3.5 (ref 3.4–5.3)
Sodium: 138 (ref 137–147)

## 2019-01-08 LAB — CBC: RBC: 3.77 — AB (ref 3.87–5.11)

## 2019-01-08 LAB — CBC AND DIFFERENTIAL
HCT: 35 — AB (ref 36–46)
Hemoglobin: 11.9 — AB (ref 12.0–16.0)
Neutrophils Absolute: 1480
Platelets: 247 (ref 150–399)
WBC: 4

## 2019-01-08 LAB — HEPATIC FUNCTION PANEL
ALT: 15 (ref 7–35)
AST: 24 (ref 13–35)
Alkaline Phosphatase: 58 (ref 25–125)
Bilirubin, Total: 0.5

## 2019-01-08 LAB — LIPID PANEL
Cholesterol: 151 (ref 0–200)
HDL: 62 (ref 35–70)
LDL Cholesterol: 75
Triglycerides: 61 (ref 40–160)

## 2019-01-08 LAB — COMPREHENSIVE METABOLIC PANEL
Albumin: 3.4 — AB (ref 3.5–5.0)
Calcium: 9.5 (ref 8.7–10.7)
GFR calc Af Amer: 74
GFR calc non Af Amer: 64

## 2019-01-08 LAB — TSH: TSH: 0.17 — AB (ref <=5.90)

## 2019-03-05 MED FILL — PROLIA 60 MG/ML SOLN: 60 | 180 days supply | Qty: 1 | Fill #1

## 2019-03-08 DIAGNOSIS — M810 Age-related osteoporosis without current pathological fracture: Secondary | ICD-10-CM | POA: Diagnosis not present

## 2019-03-27 DIAGNOSIS — M1990 Unspecified osteoarthritis, unspecified site: Secondary | ICD-10-CM | POA: Diagnosis not present

## 2019-03-27 DIAGNOSIS — Z79899 Other long term (current) drug therapy: Secondary | ICD-10-CM | POA: Diagnosis not present

## 2019-03-27 DIAGNOSIS — M069 Rheumatoid arthritis, unspecified: Secondary | ICD-10-CM | POA: Diagnosis not present

## 2019-03-27 DIAGNOSIS — M8580 Other specified disorders of bone density and structure, unspecified site: Secondary | ICD-10-CM | POA: Diagnosis not present

## 2019-04-03 DIAGNOSIS — M069 Rheumatoid arthritis, unspecified: Secondary | ICD-10-CM | POA: Diagnosis not present

## 2019-04-03 DIAGNOSIS — Z9884 Bariatric surgery status: Secondary | ICD-10-CM | POA: Diagnosis not present

## 2019-04-03 DIAGNOSIS — I10 Essential (primary) hypertension: Secondary | ICD-10-CM | POA: Diagnosis not present

## 2019-04-03 DIAGNOSIS — K210 Gastro-esophageal reflux disease with esophagitis: Secondary | ICD-10-CM | POA: Diagnosis not present

## 2019-04-03 DIAGNOSIS — Z23 Encounter for immunization: Secondary | ICD-10-CM | POA: Diagnosis not present

## 2019-05-28 ENCOUNTER — Ambulatory Visit (INDEPENDENT_AMBULATORY_CARE_PROVIDER_SITE_OTHER): Payer: Medicare Other | Admitting: Otolaryngology

## 2019-05-28 ENCOUNTER — Other Ambulatory Visit: Payer: Self-pay

## 2019-05-28 DIAGNOSIS — H903 Sensorineural hearing loss, bilateral: Secondary | ICD-10-CM | POA: Diagnosis not present

## 2019-05-29 DIAGNOSIS — M8580 Other specified disorders of bone density and structure, unspecified site: Secondary | ICD-10-CM | POA: Diagnosis not present

## 2019-05-29 DIAGNOSIS — Z79899 Other long term (current) drug therapy: Secondary | ICD-10-CM | POA: Diagnosis not present

## 2019-05-29 DIAGNOSIS — M069 Rheumatoid arthritis, unspecified: Secondary | ICD-10-CM | POA: Diagnosis not present

## 2019-05-29 DIAGNOSIS — M1990 Unspecified osteoarthritis, unspecified site: Secondary | ICD-10-CM | POA: Diagnosis not present

## 2019-07-03 DIAGNOSIS — I10 Essential (primary) hypertension: Secondary | ICD-10-CM | POA: Diagnosis not present

## 2019-07-03 DIAGNOSIS — M069 Rheumatoid arthritis, unspecified: Secondary | ICD-10-CM | POA: Diagnosis not present

## 2019-07-03 DIAGNOSIS — Z9884 Bariatric surgery status: Secondary | ICD-10-CM | POA: Diagnosis not present

## 2019-07-03 DIAGNOSIS — M1990 Unspecified osteoarthritis, unspecified site: Secondary | ICD-10-CM | POA: Diagnosis not present

## 2019-07-06 DIAGNOSIS — M810 Age-related osteoporosis without current pathological fracture: Secondary | ICD-10-CM | POA: Diagnosis not present

## 2019-07-06 DIAGNOSIS — E890 Postprocedural hypothyroidism: Secondary | ICD-10-CM | POA: Diagnosis not present

## 2019-07-06 DIAGNOSIS — E892 Postprocedural hypoparathyroidism: Secondary | ICD-10-CM | POA: Diagnosis not present

## 2019-07-06 LAB — CBC AND DIFFERENTIAL
HCT: 34 — AB (ref 36–46)
Hemoglobin: 11.2 — AB (ref 12.0–16.0)
Platelets: 224 (ref 150–399)
WBC: 3.8

## 2019-07-06 LAB — BASIC METABOLIC PANEL
BUN: 16 (ref 4–21)
CO2: 27 — AB (ref 13–22)
Chloride: 97 — AB (ref 99–108)
Creatinine: 0.9 (ref <=1.1)
Glucose: 84
Potassium: 3.4 (ref 3.4–5.3)
Sodium: 143 (ref 137–147)

## 2019-07-06 LAB — COMPREHENSIVE METABOLIC PANEL
Calcium: 9.1 (ref 8.7–10.7)
GFR calc Af Amer: 71
GFR calc non Af Amer: 62

## 2019-07-06 LAB — TSH: TSH: 0.04 — AB (ref <=5.90)

## 2019-07-06 LAB — VITAMIN D 25 HYDROXY (VIT D DEFICIENCY, FRACTURES): Vit D, 25-Hydroxy: 50.5

## 2019-07-06 LAB — CBC: RBC: 3.5 — AB (ref 3.87–5.11)

## 2019-07-28 DIAGNOSIS — I10 Essential (primary) hypertension: Secondary | ICD-10-CM | POA: Insufficient documentation

## 2019-07-28 DIAGNOSIS — K2100 Gastro-esophageal reflux disease with esophagitis, without bleeding: Secondary | ICD-10-CM

## 2019-07-28 DIAGNOSIS — R600 Localized edema: Secondary | ICD-10-CM

## 2019-07-28 DIAGNOSIS — R931 Abnormal findings on diagnostic imaging of heart and coronary circulation: Secondary | ICD-10-CM

## 2019-07-28 DIAGNOSIS — M1990 Unspecified osteoarthritis, unspecified site: Secondary | ICD-10-CM

## 2019-07-28 DIAGNOSIS — E039 Hypothyroidism, unspecified: Secondary | ICD-10-CM

## 2019-07-28 DIAGNOSIS — M069 Rheumatoid arthritis, unspecified: Secondary | ICD-10-CM

## 2019-07-28 DIAGNOSIS — Z9884 Bariatric surgery status: Secondary | ICD-10-CM

## 2019-07-28 DIAGNOSIS — M130 Polyarthritis, unspecified: Secondary | ICD-10-CM | POA: Insufficient documentation

## 2019-07-28 DIAGNOSIS — S46002D Unspecified injury of muscle(s) and tendon(s) of the rotator cuff of left shoulder, subsequent encounter: Secondary | ICD-10-CM

## 2019-07-28 DIAGNOSIS — F419 Anxiety disorder, unspecified: Secondary | ICD-10-CM

## 2019-08-13 ENCOUNTER — Other Ambulatory Visit (HOSPITAL_COMMUNITY): Payer: Self-pay | Admitting: Family Medicine

## 2019-08-13 DIAGNOSIS — Z1231 Encounter for screening mammogram for malignant neoplasm of breast: Secondary | ICD-10-CM

## 2019-08-16 DIAGNOSIS — Z79899 Other long term (current) drug therapy: Secondary | ICD-10-CM | POA: Diagnosis not present

## 2019-08-16 DIAGNOSIS — M8580 Other specified disorders of bone density and structure, unspecified site: Secondary | ICD-10-CM | POA: Diagnosis not present

## 2019-08-16 DIAGNOSIS — M069 Rheumatoid arthritis, unspecified: Secondary | ICD-10-CM | POA: Diagnosis not present

## 2019-08-16 DIAGNOSIS — M1990 Unspecified osteoarthritis, unspecified site: Secondary | ICD-10-CM | POA: Diagnosis not present

## 2019-08-20 DIAGNOSIS — E890 Postprocedural hypothyroidism: Secondary | ICD-10-CM | POA: Diagnosis not present

## 2019-08-20 DIAGNOSIS — E892 Postprocedural hypoparathyroidism: Secondary | ICD-10-CM | POA: Diagnosis not present

## 2019-08-20 DIAGNOSIS — M899 Disorder of bone, unspecified: Secondary | ICD-10-CM | POA: Diagnosis not present

## 2019-08-20 DIAGNOSIS — C73 Malignant neoplasm of thyroid gland: Secondary | ICD-10-CM | POA: Diagnosis not present

## 2019-08-20 LAB — CBC AND DIFFERENTIAL
HCT: 34 — AB (ref 36–46)
Hemoglobin: 11.2 — AB (ref 12.0–16.0)
Platelets: 224 (ref 150–399)

## 2019-08-20 LAB — CBC: RBC: 3.5 — AB (ref 3.87–5.11)

## 2019-08-20 MED FILL — PROLIA 60 MG/ML SOLN: 60 | 180 days supply | Qty: 1 | Fill #0

## 2019-08-21 ENCOUNTER — Encounter: Payer: Self-pay | Admitting: Family Medicine

## 2019-08-21 ENCOUNTER — Other Ambulatory Visit: Payer: Self-pay

## 2019-08-21 ENCOUNTER — Ambulatory Visit (INDEPENDENT_AMBULATORY_CARE_PROVIDER_SITE_OTHER): Payer: Medicare Other | Admitting: Family Medicine

## 2019-08-21 ENCOUNTER — Telehealth: Payer: Self-pay | Admitting: Family Medicine

## 2019-08-21 VITALS — BP 98/60 | HR 71 | Temp 98.4°F | Ht 61.0 in | Wt 148.6 lb

## 2019-08-21 DIAGNOSIS — M810 Age-related osteoporosis without current pathological fracture: Secondary | ICD-10-CM

## 2019-08-21 DIAGNOSIS — E785 Hyperlipidemia, unspecified: Secondary | ICD-10-CM | POA: Diagnosis not present

## 2019-08-21 DIAGNOSIS — M069 Rheumatoid arthritis, unspecified: Secondary | ICD-10-CM

## 2019-08-21 DIAGNOSIS — E039 Hypothyroidism, unspecified: Secondary | ICD-10-CM

## 2019-08-21 DIAGNOSIS — F419 Anxiety disorder, unspecified: Secondary | ICD-10-CM

## 2019-08-21 MED ORDER — TRAZODONE HCL 50 MG PO TABS: 25.0000 mg | ORAL_TABLET | Freq: Every evening | ORAL | 3 refills | Status: DC | PRN

## 2019-08-21 NOTE — Progress Notes (Signed)
New Patient Office Visit  Subjective:  Patient ID: Kathy [name], female    DOB: [birth year]  Age: 79 y.o. MRN: [id number]  Fill Date ID   Written Drug Qty Days Prescriber Rx # Pharmacy Refill   Daily Dose* Pymt Type PMP    07/30/2019  1   07/03/2019  Tramadol Hcl 50 MG Tablet  120.00  30 Ed Haw   [id number]   Car ([id number])   0  20.00 MME  Comm Ins   [place]  06/27/2019  1   01/08/2019  Tramadol Hcl 50 MG Tablet  120.00  30 Ed Haw   [id number]   Car ([id number])   5  20.00 MME  Comm Ins   [place]  05/28/2019  1   01/08/2019  Tramadol Hcl 50 MG Tablet  120.00  30 Ed Haw   [id number]   Car ([id number])   4  20.00 MME  Comm Ins   [place]  04/21/2019  1   01/08/2019  Tramadol Hcl 50 MG Tablet  120.00  30 Ed Haw   [id number]   Car ([id number])   3  20.00 MME  Comm Ins   [place]  03/19/2019  1   01/08/2019  Tramadol Hcl 50 MG Tablet  120.00  30 Ed Haw   [id number]   Car ([id number])   2  20.00 MME  Comm Ins   [place]  02/05/2019  1   01/08/2019  Tramadol Hcl 50 MG Tablet  120.00  30 Ed Haw   [id number]   Car ([id number])   1  20.00 MME  Comm Ins   [place]  01/08/2019  1   01/08/2019  Tramadol Hcl 50 MG Tablet  120.00  30 Ed Haw   [id number]   Car ([id number])   0  20.00 MME  Comm Ins   [place]  01/05/2019  1   09/28/2018  Alprazolam 0.25 MG Tablet  360.00  90 Ed Haw   [id number]   Car ([id number])   0  2.00 LME  Comm Ins   [place]  09/28/2018  1   06/27/2018  Alprazolam 0.25 MG Tablet  360.00  90 Ed Haw   [id number]   Car ([id number])   1  2.00 LME  Comm Ins   [place]  06/27/2018  1   06/27/2018  Alprazolam 0.25 MG Tablet  360.00  90 Ed Haw   [id number]   Car ([id number])   0  2.00 LME  Comm Ins   [place]  05/24/2018  1   01/16/2018  Alprazolam 0.25 MG Tablet  10.00  3 Ed Haw   [id number]   Car ([id number])   2  1.67 LME  Comm Ins   [place]  05/24/2018  1   02/10/2018  Tramadol Hcl 50 MG Tablet  120.00  30 Ed Haw   [id number]   Car ([id number])   3  20.00 MME  Private Pay   [place]  04/24/2018  1   02/10/2018  Tramadol Hcl 50 MG Tablet  120.00  30 Ed Haw   [id number]   Car ([id number])   2  20.00 MME  Private Pay     04/24/2018  1   01/16/2018   Alprazolam 0.25 MG Tablet  10.00  3 Ed Haw   [id number]   Car ([id number])   1  1.67 LME  Comm Ins     03/16/2018  1   02/10/2018  Tramadol Hcl 50 MG Tablet  120.00  30 Kathi Ludwig   [id number]  Car ([id number])   1  20.00 MME  Private Pay   [place]  02/10/2018  1   02/10/2018  Tramadol Hcl 50 MG Tablet  120.00  30 Ed Haw   [id number]   Car ([id number])   0  20.00 MME  Private Pay   [place]  01/16/2018  1   01/16/2018  Alprazolam 0.25 MG Tablet  10.00  3 Ed Haw   [id number]   Car ([id number])   0  1.67 LME  Comm Ins   [place]  01/13/2018  1   08/08/2017  Tramadol Hcl 50 MG Tablet  120.00  30 Ed Haw   [id number]   Car ([id number])   4  20.00 MME  Private Pay   [place]  01/13/2018  1   08/08/2017  Alprazolam 0.25 MG Tablet  10.00  3 Ed Haw   [id number]   Car ([id number])   5  1.67 LME  Comm Ins   [place]  12/12/2017  1   08/08/2017  Alprazolam 0.25 MG Tablet  10.00  3 Ed Haw   [id number]   Car ([id number])   4  1.67 LME  Comm Ins   [place]  12/12/2017  1   12/12/2017  Hydrocodone-Acetamin 5-325 MG  30.00  7 Andree Moro   [id number]   Car ([id number])   0  21.43 MME  Comm Ins   [place]  12/12/2017  1   08/08/2017  Tramadol Hcl 50 MG Tablet  120.00  30 Ed Haw   [id number]   Car ([id number])   3  20.00 MME  Private Pay      Gastro Assessment: Epigastric pain  Mrs. Goold has had complete resolution of her epigastric pain with change of her PPI medication. She has been able to come off all ASA products. We will leave her on pantoprazole 20 mg BID until after the holidays and will then taper to lowest possible dose of PPI therapy. We will see back in the office in 3 months at the beginning of her taper. She will call us sooner if needed.   Plan: -continue pantoprazole 20 mg BID until Jan 1. -taper to 20 mg QAM starting January 1 -f/u 3 months Pt states taking daily  Cardio-seen q 6 months Diagnostic Studies 11/2016 echo Study Conclusions  - Left ventricle: The cavity size was normal. Wall thickness was normal. Systolic function was normal. The estimated ejection fraction was in the range of 55% to 60%.  Wall motion was normal; there were no regional wall motion abnormalities. Doppler parameters are consistent with abnormal left ventricular relaxation (grade 1 diastolic dysfunction). - Aortic valve: Mildly calcified annulus. Trileaflet. - Mitral valve: There was trivial regurgitation. - Right ventricle: The cavity size was mildly dilated. - Right atrium: The atrium was at the upper limits of normal in size. - Atrial septum: No defect or patent foramen ovale was identified. - Tricuspid valve: There was trivial regurgitation. - Pulmonary arteries: PA peak pressure: 20 mm Hg (S). - Pericardium, extracardiac: A small pericardial effusion was identified circumferential to the heart.  Impressions:  - Normal LV wall thickness with LVEF 55-60% and grade 1 diastolic dysfunction. Trivial mitral regurgitation. Mildly calcified aortic annulus. Mild dilated right ventricle and upper normal right atrial chamber size. Trivial tricuspid regurgitation with PASP estimated 20 mmHg. Small circumferential pericardial effusion. CC:  Chief Complaint  Patient presents with  . Establish Care  GERD-bypass surgery 2012-125lb weight loss-bleeding ulcer in the past-resolved-endo/colonoscopy-no many NSAIDs  HPI Quetcy Vanderheide Ruehl presents  for  Ortho-bilat knee and hip replacement-left knee last replacement-no injections-stiffness with intermittent pain-Tramodol given for prn pain Rheumatology-calcium and prolia Insomnia-pt using Xanax .25 -1/2( 2 times a week)  Pt states she started xanax when her brother who was a drug addict came to live with her. Pt states she used as she cared for brother-brother no longer living with patient so she feels she can stop Endo-seen yesterday-lower thyroid medication to 31mcg-recheck after being on 90 days HTN-amlodipine taken prn" when I need it" Breast exam-left breast itchy-mammogram next month DEXA-12/30-prolia Past Medical History:  Diagnosis Date   . Abnormal findings on diagnostic imaging of heart and coronary circulation   . Anemia    FROM BLEEDING ULCER  . Anemia   . Anxiety    takes Alprazolam daily as needed  . Anxiety disorder, unspecified   . Arthritis    dx with RA 2017  . Bariatric surgery status   . Cataract   . Diverticulosis   . Essential (primary) hypertension   . Gastro-esophageal reflux disease with esophagitis   . GERD (gastroesophageal reflux disease)   . Headache(784.0)    occasionally  . History of blood transfusion    no abnormal reaction noted  . History of bronchitis    last time many yrs ago  . Hyperlipidemia    PT DENIES THIS DX -  ON NO MEDS AND NO ONE HAS TOLD HER  . Hypertension    takes Amlodipine daily  . Hypothyroidism    takes Synthroid daily  . Hypothyroidism, unspecified   . Insomnia   . Joint pain   . 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 Localized edema   . Nocturia   . Numbness    occasionally left arm at night  . Obesity   . Osteoporosis    takes Fosamax weekly  . Pain in joint involving pelvic region and thigh   . Peripheral edema    takes Lasix daily as needed  . PONV (postoperative nausea and vomiting)   . 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/2017  . Rheumatoid arthritis, unspecified ([place])   . 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 Stomach ulcer   . Unspecified injury of muscle(s) and tendon(s) of the rotator cuff of left shoulder, subsequent encounter   . Unspecified osteoarthritis, unspecified site   . Wears glasses   . Wears partial dentures     Past Surgical History:  Procedure Laterality Date  . ABDOMINAL HYSTERECTOMY     partial  . cataract surgery Bilateral   . CHOLECYSTECTOMY    . COLONOSCOPY    . EYE SURGERY     CATARACTS BOTH  . gastric bypass surgery    . JOINT REPLACEMENT Bilateral    hip  . REVERSE SHOULDER ARTHROPLASTY Right 05/11/2016   Procedure: REVERSE SHOULDER ARTHROPLASTY;  Surgeon: Marchia Bond, MD;   Location: [facility];  Service: Orthopedics;  Laterality: Right;  . REVISION TOTAL HIP ARTHROPLASTY Left 10/03/2013   DR Mayer Camel  . ROTATOR CUFF REPAIR    . TOTAL HIP REVISION Left 10/03/2013   Procedure: TOTAL HIP REVISION- left;  Surgeon: Kerin Salen, MD;  Location: [facility];  Service: Orthopedics;  Laterality: Left;  . TOTAL KNEE ARTHROPLASTY Left 11/29/2017   Procedure: LEFT TOTAL KNEE ARTHROPLASTY;  Surgeon: Marchia Bond, MD;  Location: [facility];  Service: Orthopedics;  Laterality: Left;  . TOTAL SHOULDER ARTHROPLASTY Left 11/09/2016   Procedure: TOTAL REVERSE SHOULDER ARTHROPLASTY;  Surgeon: Marchia Bond, MD;  Location:  [facility];  Service: Orthopedics;  Laterality: Left;  . TOTAL SHOULDER REPLACEMENT Left 10/2016  . TOTAL THYROIDECTOMY      Family History  Problem Relation Age of Onset  . Diabetes Mother   . Congestive Heart Failure Mother   . Lung cancer Father   . Thrombosis Sister   . CAD Brother        CABG    Social History   Socioeconomic History  . Marital status: Divorced    Spouse name: Not on file  . Number of children: Not on file  . Years of education: Not on file  . Highest education level: Not on file  Occupational History  . Occupation: disabled  Tobacco Use  . Smoking status: Never Smoker  . Smokeless tobacco: Never Used  Substance and Sexual Activity  . Alcohol use: No  . Drug use: No  . Sexual activity: Not Currently    Birth control/protection: Surgical  Other Topics Concern  . Not on file  Social History Narrative  . Not on file   Social Determinants of Health   Financial Resource Strain:   . Difficulty of Paying Living Expenses: Not on file  Food Insecurity:   . Worried About Charity fundraiser in the Last Year: Not on file  . Ran Out of Food in the Last Year: Not on file  Transportation Needs:   . Lack of Transportation (Medical): Not on file  . Lack of Transportation (Non-Medical): Not on file  Physical Activity:   . Days of Exercise per Week:  Not on file  . Minutes of Exercise per Session: Not on file  Stress:   . Feeling of Stress : Not on file  Social Connections:   . Frequency of Communication with Friends and Family: Not on file  . Frequency of Social Gatherings with Friends and Family: Not on file  . Attends Religious Services: Not on file  . Active Member of Clubs or Organizations: Not on file  . Attends Archivist Meetings: Not on file  . Marital Status: Not on file  Intimate Partner Violence:   . Fear of Current or Ex-Partner: Not on file  . Emotionally Abused: Not on file  . Physically Abused: Not on file  . Sexually Abused: Not on file    ROS Review of Systems  Constitutional: Negative.   Eyes:       Glasses  Respiratory: Negative.   Gastrointestinal:       GERD Gastric bypass  Musculoskeletal: Positive for arthralgias and myalgias.  Psychiatric/Behavioral: The patient is nervous/anxious.     Objective:   Today's Vitals: BP 98/60 (BP Location: Left Arm, Patient Position: Sitting, Cuff Size: Normal)   Pulse 71   Temp 98.4 F (36.9 C) (Temporal)   Ht 5\' 1"  (1.549 m)   Wt 148 lb 9.6 oz (67.4 kg)   SpO2 98%   BMI 28.08 kg/m   Physical Exam Constitutional:      Appearance: Normal appearance. She is normal weight.  HENT:     Head: Normocephalic and atraumatic.  Cardiovascular:     Rate and Rhythm: Normal rate and regular rhythm.     Pulses: Normal pulses.     Heart sounds: Normal heart sounds.  Pulmonary:     Effort: Pulmonary effort is normal.     Breath sounds: Normal breath sounds.  Musculoskeletal:     Cervical back: Normal range of motion and neck supple.  Neurological:     Mental Status:  She is alert and oriented to person, place, and time.  Psychiatric:        Mood and Affect: Mood normal.     Assessment & Plan:   1. Rheumatoid arthritis involving multiple sites, unspecified whether rheumatoid factor present ([place]) Pt taking tramadol-sees rheumatology but concerned  about ongoing pain management - Ambulatory referral to Pain Clinic  2. Age-related osteoporosis without current pathological fracture prolia-recent DEXA  3. Hyperlipidemia, unspecified hyperlipidemia type Fish oil  4. Hypothyroidism, unspecified type Synthroid-rx  5. Anxiety disorder, unspecified type Pt feels she can come off xanax since she is only taking 2 times a week 1/2 at night to sleep. Pt states anxiety is worse at night-trazodone recommended  Outpatient Encounter Medications as of 08/21/2019  Medication Sig  . ALPRAZolam (XANAX) 0.25 MG tablet Take 0.25 mg by mouth at bedtime as needed for anxiety or sleep.  Marland Kitchen AMLODIPINE BENZOATE PO Take 5 mg by mouth daily.  . calcium carbonate (OSCAL) 1500 (600 Ca) MG TABS tablet Take 300-600 mg by mouth 3 (three) times daily. Take 1 tablet (600 mg) in the morning, 0.5 tablet (300 mg) at noon, & 1 tablet (600 mg) in the evening.  . Cholecalciferol 1000 UNITS TBDP Take 1,000 Units by mouth 2 (two) times daily.   Marland Kitchen denosumab (PROLIA) 60 MG/ML SOLN injection Inject 60 mg into the skin every 6 (six) months. Administer in upper arm, thigh, or abdomen  . diclofenac sodium (VOLTAREN) 1 % GEL Apply 2 g topically 2 (two) times daily as needed (for pain in knees).   . folic acid (FOLVITE) 1 MG tablet Take 1 mg by mouth daily.  Marland Kitchen levothyroxine (SYNTHROID) 88 MCG tablet Take 88 mcg by mouth daily before breakfast.  . Melatonin ([employer]S MELATONIN) 10 MG CAPS Take 10 mg by mouth at bedtime.   . Multiple Vitamin (MULTIVITAMIN WITH MINERALS) TABS tablet Take 1 tablet by mouth daily.   . Omega-3 Fatty Acids (FISH OIL) 1000 MG CAPS Take 1,000 mg by mouth daily.  . pantoprazole (PROTONIX) 20 MG tablet Take 20 mg by mouth daily.  Marland Kitchen POTASSIUM CHLORIDE PO Take 20 mEq by mouth.  . potassium chloride SA (K-DUR,KLOR-CON) 20 MEQ tablet TAKE 2 TABLETS DAILY. (Patient taking differently: TAKE 1 TABLET (20 MEQ) BY MOUTH DAILY IF NEEDED WITH ADDITIONAL LASIX (20 MG) DOSE.)   . torsemide (DEMADEX) 20 MG tablet Take 20 mg by mouth daily.  . traMADol (ULTRAM) 50 MG tablet Take 50 mg by mouth every 6 (six) hours as needed for moderate pain.   . [DISCONTINUED] furosemide (LASIX) 20 MG tablet May take an additional 20 mg daily (in addition to 40 mg daily dose) for leg swelling (Patient not taking: Reported on 08/21/2019)  . [DISCONTINUED] furosemide (LASIX) 40 MG tablet Take 40 mg by mouth every evening.   . [DISCONTINUED] omeprazole (PRILOSEC) 40 MG capsule Take 40 mg by mouth daily as needed (for stomach issues (indigestion/upset stomach)).  . [DISCONTINUED] SYNTHROID 75 MCG tablet Take 75 mcg by mouth daily before breakfast.   . [DISCONTINUED] traZODone (DESYREL) 50 MG tablet Take 50 mg by mouth at bedtime as needed for sleep ((IF MELATONIN NOT EFFECTIVE)).    No facility-administered encounter medications on file as of 08/21/2019.    Follow-up: 6 months  LISA Hannah Beat, MD

## 2019-08-21 NOTE — Patient Instructions (Signed)
Pain management referral Pt trial of trazodone instead of xanax-will notify if referral needed for evaluation -psy

## 2019-08-21 NOTE — Telephone Encounter (Signed)
Add to med list and sent to pharmacy with 2 refills

## 2019-08-21 NOTE — Telephone Encounter (Signed)
Patient was seen in the office today and states she forgot to mention that she is on Diclofenac Sodium 1 % Cream that she uses for her joints. She is needing a refill/  [place], [place] Phone:  [phone]  Fax:  [phone]

## 2019-08-21 NOTE — Telephone Encounter (Signed)
Routing to Dr. Corum for advice ? 

## 2019-08-22 ENCOUNTER — Telehealth (INDEPENDENT_AMBULATORY_CARE_PROVIDER_SITE_OTHER): Payer: Self-pay

## 2019-08-22 DIAGNOSIS — M1990 Unspecified osteoarthritis, unspecified site: Secondary | ICD-10-CM

## 2019-08-22 MED ORDER — DICLOFENAC SODIUM 1 % EX GEL: 2.0000 g | Freq: Four times a day (QID) | CUTANEOUS | 2 refills | Status: DC

## 2019-08-22 NOTE — Telephone Encounter (Signed)
Kathy [name], CMA  

## 2019-08-22 NOTE — Telephone Encounter (Signed)
Done

## 2019-08-31 ENCOUNTER — Other Ambulatory Visit: Payer: Self-pay

## 2019-08-31 ENCOUNTER — Ambulatory Visit: Payer: Medicare Other | Attending: Internal Medicine

## 2019-08-31 DIAGNOSIS — Z20822 Contact with and (suspected) exposure to covid-19: Secondary | ICD-10-CM

## 2019-09-01 LAB — NOVEL CORONAVIRUS, NAA: SARS-CoV-2, NAA: NOT DETECTED

## 2019-09-07 DIAGNOSIS — Z96652 Presence of left artificial knee joint: Secondary | ICD-10-CM | POA: Diagnosis not present

## 2019-09-25 DIAGNOSIS — G894 Chronic pain syndrome: Secondary | ICD-10-CM | POA: Diagnosis not present

## 2019-09-25 DIAGNOSIS — M79643 Pain in unspecified hand: Secondary | ICD-10-CM | POA: Diagnosis not present

## 2019-09-25 DIAGNOSIS — G5600 Carpal tunnel syndrome, unspecified upper limb: Secondary | ICD-10-CM | POA: Diagnosis not present

## 2019-09-25 DIAGNOSIS — M25569 Pain in unspecified knee: Secondary | ICD-10-CM | POA: Diagnosis not present

## 2019-10-01 ENCOUNTER — Ambulatory Visit (HOSPITAL_COMMUNITY): Payer: Medicare Other

## 2019-10-02 DIAGNOSIS — E892 Postprocedural hypoparathyroidism: Secondary | ICD-10-CM | POA: Diagnosis not present

## 2019-10-02 DIAGNOSIS — E876 Hypokalemia: Secondary | ICD-10-CM | POA: Diagnosis not present

## 2019-10-05 ENCOUNTER — Encounter: Payer: Self-pay | Admitting: Family Medicine

## 2019-10-08 MED FILL — PROLIA 60 MG/ML SOLN: 60 | 180 days supply | Qty: 1 | Fill #0

## 2019-10-12 DIAGNOSIS — M810 Age-related osteoporosis without current pathological fracture: Secondary | ICD-10-CM | POA: Diagnosis not present

## 2019-11-08 ENCOUNTER — Other Ambulatory Visit (HOSPITAL_COMMUNITY): Payer: Self-pay | Admitting: Nurse Practitioner

## 2019-11-08 DIAGNOSIS — Z1231 Encounter for screening mammogram for malignant neoplasm of breast: Secondary | ICD-10-CM

## 2019-11-15 ENCOUNTER — Ambulatory Visit: Payer: Medicare Other | Admitting: Family Medicine

## 2019-11-16 ENCOUNTER — Ambulatory Visit (HOSPITAL_COMMUNITY)
Admission: RE | Admit: 2019-11-16 | Discharge: 2019-11-16 | Disposition: A | Discharge status: Home / Self Care | Payer: Medicare Other | Source: Ambulatory Visit | Attending: Nurse Practitioner | Admitting: Nurse Practitioner

## 2019-11-16 ENCOUNTER — Other Ambulatory Visit: Payer: Self-pay

## 2019-11-16 DIAGNOSIS — Z1231 Encounter for screening mammogram for malignant neoplasm of breast: Secondary | ICD-10-CM | POA: Diagnosis not present

## 2019-11-19 ENCOUNTER — Other Ambulatory Visit: Payer: Self-pay | Admitting: Nurse Practitioner

## 2019-11-19 ENCOUNTER — Other Ambulatory Visit (HOSPITAL_COMMUNITY): Payer: Self-pay | Admitting: Nurse Practitioner

## 2019-11-19 DIAGNOSIS — R928 Other abnormal and inconclusive findings on diagnostic imaging of breast: Secondary | ICD-10-CM

## 2019-11-19 NOTE — Progress Notes (Signed)
Let pt know results of mammogram and recommend diagnostic imaging per the report. She should be contacted to schedule however if not contacted in one week she should reach out to schedule.

## 2019-11-20 DIAGNOSIS — M25569 Pain in unspecified knee: Secondary | ICD-10-CM | POA: Diagnosis not present

## 2019-11-20 DIAGNOSIS — M79643 Pain in unspecified hand: Secondary | ICD-10-CM | POA: Diagnosis not present

## 2019-11-20 DIAGNOSIS — G5600 Carpal tunnel syndrome, unspecified upper limb: Secondary | ICD-10-CM | POA: Diagnosis not present

## 2019-11-22 ENCOUNTER — Other Ambulatory Visit: Payer: Self-pay

## 2019-11-22 ENCOUNTER — Encounter: Payer: Self-pay | Admitting: Nurse Practitioner

## 2019-11-22 ENCOUNTER — Ambulatory Visit (INDEPENDENT_AMBULATORY_CARE_PROVIDER_SITE_OTHER): Payer: Medicare Other | Admitting: Nurse Practitioner

## 2019-11-22 VITALS — BP 120/62 | HR 62 | Temp 97.6°F | Resp 18 | Ht 61.0 in | Wt 155.4 lb

## 2019-11-22 DIAGNOSIS — Z0001 Encounter for general adult medical examination with abnormal findings: Secondary | ICD-10-CM | POA: Diagnosis not present

## 2019-11-22 DIAGNOSIS — E039 Hypothyroidism, unspecified: Secondary | ICD-10-CM | POA: Diagnosis not present

## 2019-11-22 DIAGNOSIS — E785 Hyperlipidemia, unspecified: Secondary | ICD-10-CM | POA: Diagnosis not present

## 2019-11-22 DIAGNOSIS — M069 Rheumatoid arthritis, unspecified: Secondary | ICD-10-CM | POA: Diagnosis not present

## 2019-11-22 DIAGNOSIS — I10 Essential (primary) hypertension: Secondary | ICD-10-CM

## 2019-11-22 NOTE — Progress Notes (Signed)
New Patient Office Visit  Subjective:  Patient ID: Kathy [name], female    DOB: 27-Dec-1940  Age: 79 y.o. MRN: [id number]  CC:  Chief Complaint  Patient presents with  . Establish Care    NP    HPI Kathy [name] is a 79 year old female presenting to establish care. Discussed diet and exercise, low fat/cholesterol, RA without specialist referring today, pt agrees. Labs today for metabolic screen, follow up anemia, thyroid, HTN stable today dicussed treatment plan. She feels in overall good health today.no cp/ct, gu/gi sxs, pain, sob, edema, or falls in past year.  Tdap not sure, declined today Has completed Covid vaccine x2 Mammogram 1 weeks ago Vaginal/PAP 3 years ago will schedule vaginal pelvic exam in office Cscope 4 years ago due in 1 year on 5 yr track Dexa completed Dec 2020 by previous provider   Past Medical History:  Diagnosis Date  . Abnormal findings on diagnostic imaging of heart and coronary circulation   . Anemia    FROM BLEEDING ULCER  . Anemia   . Anxiety    takes Alprazolam daily as needed  . Anxiety disorder, unspecified   . Arthritis    dx with RA 2017  . Bariatric surgery status   . Cataract   . Diverticulosis   . Essential (primary) hypertension   . Gastro-esophageal reflux disease with esophagitis   . GERD (gastroesophageal reflux disease)   . Headache(784.0)    occasionally  . History of blood transfusion    no abnormal reaction noted  . History of bronchitis    last time many yrs ago  . Hyperlipidemia    PT DENIES THIS DX -  ON NO MEDS AND NO ONE HAS TOLD HER  . Hypertension    takes Amlodipine daily  . Hypothyroidism    takes Synthroid daily  . Hypothyroidism, unspecified   . Insomnia   . Joint pain   . 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 Localized edema   . Nocturia   . Numbness    occasionally left arm at night  . Obesity   . Osteoporosis    takes Fosamax weekly  . Pain in joint involving pelvic region  and thigh   . Peripheral edema    takes Lasix daily as needed  . PONV (postoperative nausea and vomiting)   . 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/2017  . Rheumatoid arthritis, unspecified ([place])   . 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 Stomach ulcer   . Unspecified injury of muscle(s) and tendon(s) of the rotator cuff of left shoulder, subsequent encounter   . Unspecified osteoarthritis, unspecified site   . Wears glasses   . Wears partial dentures     Past Surgical History:  Procedure Laterality Date  . ABDOMINAL HYSTERECTOMY     partial  . cataract surgery Bilateral   . CHOLECYSTECTOMY    . COLONOSCOPY    . EYE SURGERY     CATARACTS BOTH  . gastric bypass surgery    . JOINT REPLACEMENT Bilateral    hip  . REVERSE SHOULDER ARTHROPLASTY Right 05/11/2016   Procedure: REVERSE SHOULDER ARTHROPLASTY;  Surgeon: Marchia Bond, MD;  Location: [facility];  Service: Orthopedics;  Laterality: Right;  . REVISION TOTAL HIP ARTHROPLASTY Left 10/03/2013   DR Mayer Camel  . ROTATOR CUFF REPAIR    . TOTAL HIP REVISION Left 10/03/2013   Procedure: TOTAL HIP REVISION- left;  Surgeon: Kerin Salen, MD;  Location: [facility]  OR;  Service: Orthopedics;  Laterality: Left;  . TOTAL KNEE ARTHROPLASTY Left 11/29/2017   Procedure: LEFT TOTAL KNEE ARTHROPLASTY;  Surgeon: Marchia Bond, MD;  Location: [place];  Service: Orthopedics;  Laterality: Left;  . TOTAL SHOULDER ARTHROPLASTY Left 11/09/2016   Procedure: TOTAL REVERSE SHOULDER ARTHROPLASTY;  Surgeon: Marchia Bond, MD;  Location: [place];  Service: Orthopedics;  Laterality: Left;  . TOTAL SHOULDER REPLACEMENT Left 10/2016  . TOTAL THYROIDECTOMY      Family History  Problem Relation Age of Onset  . Diabetes Mother   . Congestive Heart Failure Mother   . Lung cancer Father   . Thrombosis Sister   . CAD Brother        CABG    Social History   Socioeconomic History  . Marital status: Divorced    Spouse name: Not on file  . Number of  children: Not on file  . Years of education: Not on file  . Highest education level: Not on file  Occupational History  . Occupation: disabled  Tobacco Use  . Smoking status: Never Smoker  . Smokeless tobacco: Never Used  Substance and Sexual Activity  . Alcohol use: No  . Drug use: No  . Sexual activity: Not Currently    Birth control/protection: Surgical  Other Topics Concern  . Not on file  Social History Narrative  . Not on file   Social Determinants of Health   Financial Resource Strain:   . Difficulty of Paying Living Expenses:   Food Insecurity:   . Worried About Charity fundraiser in the Last Year:   . Arboriculturist in the Last Year:   Transportation Needs:   . Film/video editor (Medical):   Marland Kitchen Lack of Transportation (Non-Medical):   Physical Activity:   . Days of Exercise per Week:   . Minutes of Exercise per Session:   Stress:   . Feeling of Stress :   Social Connections:   . Frequency of Communication with Friends and Family:   . Frequency of Social Gatherings with Friends and Family:   . Attends Religious Services:   . Active Member of Clubs or Organizations:   . Attends Archivist Meetings:   Marland Kitchen Marital Status:   Intimate Partner Violence:   . Fear of Current or Ex-Partner:   . Emotionally Abused:   Marland Kitchen Physically Abused:   . Sexually Abused:     ROS Review of Systems  All other systems reviewed and are negative.   Objective:   Today's Vitals: BP 120/62 (BP Location: Left Arm, Patient Position: Sitting, Cuff Size: Normal)   Pulse 62   Temp 97.6 F (36.4 C) (Temporal)   Resp 18   Ht 5\' 1"  (1.549 m)   Wt 155 lb 6.4 oz (70.5 kg)   SpO2 98%   BMI 29.36 kg/m   Physical Exam Vitals and nursing note reviewed.  Constitutional:      Appearance: Normal appearance. She is well-developed and well-groomed. She is not ill-appearing or toxic-appearing.  HENT:     Head: Normocephalic.     Right Ear: Hearing and external ear normal.      Left Ear: Hearing and external ear normal.     Nose: Nose normal.     Mouth/Throat:     Lips: Pink.     Mouth: Mucous membranes are moist.     Pharynx: Oropharynx is clear.  Eyes:     General: Lids are normal. Lids are everted, no  foreign bodies appreciated.     Extraocular Movements: Extraocular movements intact.     Conjunctiva/sclera: Conjunctivae normal.     Pupils: Pupils are equal, round, and reactive to light.  Neck:     Thyroid: No thyromegaly or thyroid tenderness.     Vascular: No carotid bruit or JVD.  Cardiovascular:     Rate and Rhythm: Normal rate and regular rhythm.     Heart sounds: Normal heart sounds, S1 normal and S2 normal.  Pulmonary:     Effort: Pulmonary effort is normal.     Breath sounds: Normal breath sounds.  Chest:     Chest wall: No deformity.  Abdominal:     General: Abdomen is flat. Bowel sounds are normal. There is no abdominal bruit.  Musculoskeletal:        General: Normal range of motion.     Cervical back: Normal range of motion and neck supple.     Right lower leg: No edema.     Left lower leg: No edema.  Lymphadenopathy:     Cervical: No cervical adenopathy.  Skin:    General: Skin is warm and dry.     Capillary Refill: Capillary refill takes less than 2 seconds.  Neurological:     General: No focal deficit present.     Mental Status: She is alert and oriented to person, place, and time.  Psychiatric:        Attention and Perception: Attention normal.        Mood and Affect: Mood normal.        Speech: Speech normal.        Behavior: Behavior is cooperative.        Thought Content: Thought content normal.        Cognition and Memory: Cognition normal.        Judgment: Judgment normal.      Assessment & Plan:  Established care today Healthy weight: diet and exercise print out provided Follow low fat and cholesterol diet HTN: take medications as prescribed, take home bp log 3 hours after morning medication bring to your apt,  seek medical attn for >140/90 Labs today Follow up in 6 months labs one week prior Rheumatology referral you should be contacted in 1 week for apt if not call our office. Problem List Items Addressed This Visit      Cardiovascular and Mediastinum   Hypertension   Relevant Orders   CBC with Differential/Platelet   COMPLETE METABOLIC PANEL WITH GFR     Endocrine   Hypothyroidism, unspecified   Relevant Orders   TSH   T4, free     Musculoskeletal and Integument   RA (rheumatoid arthritis) ([place])   Relevant Orders   Ambulatory referral to Rheumatology     Other   Hyperlipidemia - Primary   Relevant Orders   Lipid panel      Outpatient Encounter Medications as of 11/22/2019  Medication Sig  . ALPRAZolam (XANAX) 0.25 MG tablet Take 0.25 mg by mouth at bedtime as needed for anxiety or sleep.  Marland Kitchen AMLODIPINE BENZOATE PO Take 5 mg by mouth daily.  . calcium carbonate (OSCAL) 1500 (600 Ca) MG TABS tablet Take 300-600 mg by mouth 3 (three) times daily. Take 1 tablet (600 mg) in the morning, 0.5 tablet (300 mg) at noon, & 1 tablet (600 mg) in the evening.  . Cholecalciferol 1000 UNITS TBDP Take 1,000 Units by mouth 2 (two) times daily.   Marland Kitchen denosumab (PROLIA) 60 MG/ML SOLN injection  Inject 60 mg into the skin every 6 (six) months. Administer in upper arm, thigh, or abdomen  . diclofenac sodium (VOLTAREN) 1 % GEL Apply 2 g topically 2 (two) times daily as needed (for pain in knees).   . folic acid (FOLVITE) 1 MG tablet Take 1 mg by mouth daily.  Marland Kitchen levothyroxine (SYNTHROID) 88 MCG tablet Take 88 mcg by mouth daily before breakfast.  . Multiple Vitamin (MULTIVITAMIN WITH MINERALS) TABS tablet Take 1 tablet by mouth daily.   . Omega-3 Fatty Acids (FISH OIL) 1000 MG CAPS Take 1,000 mg by mouth daily.  . potassium chloride SA (K-DUR,KLOR-CON) 20 MEQ tablet TAKE 2 TABLETS DAILY. (Patient taking differently: TAKE 1 TABLET (20 MEQ) BY MOUTH DAILY IF NEEDED WITH ADDITIONAL LASIX (20 MG) DOSE.)  .  torsemide (DEMADEX) 20 MG tablet Take 20 mg by mouth daily.  . traMADol (ULTRAM) 50 MG tablet Take 50 mg by mouth every 6 (six) hours as needed for moderate pain.   . [DISCONTINUED] POTASSIUM CHLORIDE PO Take 20 mEq by mouth.  . [DISCONTINUED] diclofenac Sodium (VOLTAREN) 1 % GEL Apply 2 g topically 4 (four) times daily.  . [DISCONTINUED] Melatonin ([employer] MELATONIN) 10 MG CAPS Take 10 mg by mouth at bedtime.   . [DISCONTINUED] pantoprazole (PROTONIX) 20 MG tablet Take 20 mg by mouth daily.  . [DISCONTINUED] traZODone (DESYREL) 50 MG tablet Take 0.5-1 tablets (25-50 mg total) by mouth at bedtime as needed for sleep.   No facility-administered encounter medications on file as of 11/22/2019.    Follow-up: Return in about 6 months (around 05/23/2020) for one week prior complete labs.   Reed Main, FNP

## 2019-11-22 NOTE — Patient Instructions (Addendum)
Established care today Healthy weight: diet and exercise print out provided Follow low fat and cholesterol diet HTN: take medications as prescribed, take home bp log 3 hours after morning medication bring to your apt, seek medical attn for >140/90 Labs today Follow up in 6 months labs one week prior Rheumatology referral you should be contacted in 1 week for apt if not call our office.

## 2019-11-23 ENCOUNTER — Other Ambulatory Visit: Payer: Medicare Other

## 2019-11-23 LAB — LIPID PANEL
Cholesterol: 142 mg/dL (ref <200)
HDL: 58 mg/dL (ref >=50)
LDL Cholesterol (Calc): 71 mg/dL (calc)
Non-HDL Cholesterol (Calc): 84 mg/dL (calc) (ref <130)
Total CHOL/HDL Ratio: 2.4 (calc) (ref <5.0)
Triglycerides: 52 mg/dL (ref <150)

## 2019-11-23 LAB — COMPLETE METABOLIC PANEL WITH GFR
AG Ratio: 0.9 (calc) — ABNORMAL LOW (ref 1.0–2.5)
ALT: 15 U/L (ref 6–29)
AST: 27 U/L (ref 10–35)
Albumin: 3.6 g/dL (ref 3.6–5.1)
Alkaline phosphatase (APISO): 60 U/L (ref 37–153)
BUN: 19 mg/dL (ref 7–25)
CO2: 26 mmol/L (ref 20–32)
Calcium: 8.4 mg/dL — ABNORMAL LOW (ref 8.6–10.4)
Chloride: 102 mmol/L (ref 98–110)
Creat: 0.81 mg/dL (ref 0.60–0.93)
GFR, Est African American: 81 mL/min/{1.73_m2} (ref >=60)
GFR, Est Non African American: 70 mL/min/{1.73_m2} (ref >=60)
Globulin: 4.1 g/dL (calc) — ABNORMAL HIGH (ref 1.9–3.7)
Glucose, Bld: 88 mg/dL (ref 65–99)
Potassium: 4.3 mmol/L (ref 3.5–5.3)
Sodium: 141 mmol/L (ref 135–146)
Total Bilirubin: 0.4 mg/dL (ref 0.2–1.2)
Total Protein: 7.7 g/dL (ref 6.1–8.1)

## 2019-11-23 LAB — CBC WITH DIFFERENTIAL/PLATELET
Absolute Monocytes: 383 cells/uL (ref 200–950)
Basophils Absolute: 10 cells/uL (ref 0–200)
Basophils Relative: 0.3 %
Eosinophils Absolute: 40 cells/uL (ref 15–500)
Eosinophils Relative: 1.2 %
HCT: 36 % (ref 35.0–45.0)
Hemoglobin: 12 g/dL (ref 11.7–15.5)
Lymphs Abs: 1822 cells/uL (ref 850–3900)
MCH: 31.8 pg (ref 27.0–33.0)
MCHC: 33.3 g/dL (ref 32.0–36.0)
MCV: 95.5 fL (ref 80.0–100.0)
MPV: 11.1 fL (ref 7.5–12.5)
Monocytes Relative: 11.6 %
Neutro Abs: 1046 cells/uL — ABNORMAL LOW (ref 1500–7800)
Neutrophils Relative %: 31.7 %
Platelets: 251 10*3/uL (ref 140–400)
RBC: 3.77 10*6/uL — ABNORMAL LOW (ref 3.80–5.10)
RDW: 12.8 % (ref 11.0–15.0)
Total Lymphocyte: 55.2 %
WBC: 3.3 10*3/uL — ABNORMAL LOW (ref 3.8–10.8)

## 2019-11-23 LAB — TSH: TSH: 0.37 mIU/L — ABNORMAL LOW (ref 0.40–4.50)

## 2019-11-23 LAB — T4, FREE: Free T4: 1.2 ng/dL (ref 0.8–1.8)

## 2019-11-26 ENCOUNTER — Other Ambulatory Visit: Payer: Self-pay | Admitting: Nurse Practitioner

## 2019-11-26 DIAGNOSIS — E039 Hypothyroidism, unspecified: Secondary | ICD-10-CM

## 2019-11-26 MED ORDER — LEVOTHYROXINE SODIUM 75 MCG PO TABS: 75.0000 ug | ORAL_TABLET | Freq: Every day | ORAL | 1 refills | Status: DC

## 2019-11-26 NOTE — Progress Notes (Signed)
Let pt know that I have sent RX to change her dose of Thyroid replacement. We will need to recheck her levels in 8 weeks.

## 2019-11-27 ENCOUNTER — Ambulatory Visit (HOSPITAL_COMMUNITY)
Admission: RE | Admit: 2019-11-27 | Discharge: 2019-11-27 | Disposition: A | Discharge status: Home / Self Care | Payer: Medicare Other | Source: Ambulatory Visit | Attending: Nurse Practitioner | Admitting: Nurse Practitioner

## 2019-11-27 ENCOUNTER — Other Ambulatory Visit: Payer: Self-pay

## 2019-11-27 DIAGNOSIS — R928 Other abnormal and inconclusive findings on diagnostic imaging of breast: Secondary | ICD-10-CM

## 2019-11-27 NOTE — Progress Notes (Signed)
Let pt know  IMPRESSION: No mammographic evidence of malignancy in the left breast.  RECOMMENDATION: Annual screening mammography.

## 2019-12-04 ENCOUNTER — Other Ambulatory Visit (HOSPITAL_COMMUNITY): Payer: Medicare Other

## 2019-12-04 ENCOUNTER — Encounter (HOSPITAL_COMMUNITY): Payer: Medicare Other

## 2019-12-06 ENCOUNTER — Other Ambulatory Visit: Payer: Self-pay | Admitting: Family Medicine

## 2019-12-07 DIAGNOSIS — Z96652 Presence of left artificial knee joint: Secondary | ICD-10-CM | POA: Diagnosis not present

## 2019-12-25 ENCOUNTER — Other Ambulatory Visit: Payer: Self-pay | Admitting: Nurse Practitioner

## 2019-12-25 ENCOUNTER — Other Ambulatory Visit (INDEPENDENT_AMBULATORY_CARE_PROVIDER_SITE_OTHER): Payer: Self-pay | Admitting: Family Medicine

## 2019-12-25 DIAGNOSIS — M1990 Unspecified osteoarthritis, unspecified site: Secondary | ICD-10-CM

## 2019-12-25 DIAGNOSIS — E039 Hypothyroidism, unspecified: Secondary | ICD-10-CM

## 2019-12-26 ENCOUNTER — Other Ambulatory Visit (INDEPENDENT_AMBULATORY_CARE_PROVIDER_SITE_OTHER): Payer: Self-pay | Admitting: Nurse Practitioner

## 2019-12-26 DIAGNOSIS — M1990 Unspecified osteoarthritis, unspecified site: Secondary | ICD-10-CM

## 2020-01-15 DIAGNOSIS — G5603 Carpal tunnel syndrome, bilateral upper limbs: Secondary | ICD-10-CM | POA: Diagnosis not present

## 2020-01-15 DIAGNOSIS — M79643 Pain in unspecified hand: Secondary | ICD-10-CM | POA: Diagnosis not present

## 2020-01-15 DIAGNOSIS — G5600 Carpal tunnel syndrome, unspecified upper limb: Secondary | ICD-10-CM | POA: Diagnosis not present

## 2020-01-15 DIAGNOSIS — M25569 Pain in unspecified knee: Secondary | ICD-10-CM | POA: Diagnosis not present

## 2020-01-16 DIAGNOSIS — M150 Primary generalized (osteo)arthritis: Secondary | ICD-10-CM | POA: Diagnosis not present

## 2020-01-16 DIAGNOSIS — M0609 Rheumatoid arthritis without rheumatoid factor, multiple sites: Secondary | ICD-10-CM | POA: Diagnosis not present

## 2020-01-16 DIAGNOSIS — M2550 Pain in unspecified joint: Secondary | ICD-10-CM | POA: Diagnosis not present

## 2020-01-16 DIAGNOSIS — R5383 Other fatigue: Secondary | ICD-10-CM | POA: Diagnosis not present

## 2020-01-22 ENCOUNTER — Other Ambulatory Visit: Payer: Self-pay

## 2020-01-22 ENCOUNTER — Other Ambulatory Visit: Payer: Medicare Other

## 2020-01-22 DIAGNOSIS — E039 Hypothyroidism, unspecified: Secondary | ICD-10-CM | POA: Diagnosis not present

## 2020-01-23 ENCOUNTER — Other Ambulatory Visit: Payer: Self-pay | Admitting: Nurse Practitioner

## 2020-01-23 LAB — TSH: TSH: 7.85 mIU/L — ABNORMAL HIGH (ref 0.40–4.50)

## 2020-01-23 LAB — T4, FREE: Free T4: 1.2 ng/dL (ref 0.8–1.8)

## 2020-01-23 NOTE — Progress Notes (Signed)
Find out what levothyroxine dose the pt has been taking over the past 8 weeks. I would like you to ask her to read from her bottle that she is taking it from. Is she taking it as prescribed?

## 2020-01-28 ENCOUNTER — Other Ambulatory Visit: Payer: Self-pay | Admitting: Nurse Practitioner

## 2020-01-28 DIAGNOSIS — R7989 Other specified abnormal findings of blood chemistry: Secondary | ICD-10-CM

## 2020-01-28 DIAGNOSIS — E039 Hypothyroidism, unspecified: Secondary | ICD-10-CM

## 2020-01-28 MED ORDER — LEVOTHYROXINE SODIUM 88 MCG PO TABS: 88.0000 ug | ORAL_TABLET | Freq: Every day | ORAL | 0 refills | Status: DC

## 2020-01-28 NOTE — Progress Notes (Signed)
Increased levothyroxine dose repeated labs in 8 weeks

## 2020-02-08 DIAGNOSIS — M150 Primary generalized (osteo)arthritis: Secondary | ICD-10-CM | POA: Diagnosis not present

## 2020-02-08 DIAGNOSIS — M2550 Pain in unspecified joint: Secondary | ICD-10-CM | POA: Diagnosis not present

## 2020-02-08 DIAGNOSIS — M0609 Rheumatoid arthritis without rheumatoid factor, multiple sites: Secondary | ICD-10-CM | POA: Diagnosis not present

## 2020-02-13 ENCOUNTER — Other Ambulatory Visit: Payer: Self-pay | Admitting: Family Medicine

## 2020-02-25 ENCOUNTER — Ambulatory Visit: Payer: Medicare Other | Admitting: Family Medicine

## 2020-03-20 ENCOUNTER — Other Ambulatory Visit: Payer: Self-pay | Admitting: Family Medicine

## 2020-03-27 ENCOUNTER — Telehealth: Payer: Self-pay

## 2020-03-27 ENCOUNTER — Ambulatory Visit: Payer: Self-pay

## 2020-03-27 NOTE — Telephone Encounter (Signed)
Pt. States her last COVID 19 vaccine was in February and she is a high risk pt. Asking does she need to wait 8 months for her booster. Instructed from [facility] information she can get her booster.States "My doctor told me to wait 8 months." States she will check with health department.

## 2020-04-15 MED FILL — PROLIA 60 MG/ML SOLN: 60 | 180 days supply | Qty: 1 | Fill #1

## 2020-04-16 ENCOUNTER — Encounter: Payer: Self-pay | Admitting: Family Medicine

## 2020-04-16 ENCOUNTER — Other Ambulatory Visit: Payer: Self-pay

## 2020-04-16 ENCOUNTER — Ambulatory Visit (INDEPENDENT_AMBULATORY_CARE_PROVIDER_SITE_OTHER): Payer: Medicare Other | Admitting: Family Medicine

## 2020-04-16 VITALS — BP 130/78 | HR 66 | Temp 97.7°F | Resp 16 | Ht 61.0 in | Wt 157.6 lb

## 2020-04-16 DIAGNOSIS — M79643 Pain in unspecified hand: Secondary | ICD-10-CM | POA: Diagnosis not present

## 2020-04-16 DIAGNOSIS — G5600 Carpal tunnel syndrome, unspecified upper limb: Secondary | ICD-10-CM | POA: Diagnosis not present

## 2020-04-16 DIAGNOSIS — M5442 Lumbago with sciatica, left side: Secondary | ICD-10-CM

## 2020-04-16 DIAGNOSIS — M069 Rheumatoid arthritis, unspecified: Secondary | ICD-10-CM | POA: Diagnosis not present

## 2020-04-16 DIAGNOSIS — M25569 Pain in unspecified knee: Secondary | ICD-10-CM | POA: Diagnosis not present

## 2020-04-16 DIAGNOSIS — G5603 Carpal tunnel syndrome, bilateral upper limbs: Secondary | ICD-10-CM | POA: Diagnosis not present

## 2020-04-16 LAB — URINALYSIS, ROUTINE W REFLEX MICROSCOPIC
Bilirubin Urine: NEGATIVE
Glucose, UA: NEGATIVE
Hgb urine dipstick: NEGATIVE
Leukocytes,Ua: NEGATIVE
Nitrite: NEGATIVE
Protein, ur: NEGATIVE
Specific Gravity, Urine: 1.02 (ref 1.001–1.03)
pH: 5.5 (ref 5.0–8.0)

## 2020-04-16 MED ORDER — PREDNISONE 20 MG PO TABS: ORAL_TABLET | ORAL | 0 refills | Status: DC

## 2020-04-16 NOTE — Patient Instructions (Addendum)
Continue your pain medication F/U 4 months for wellness visit

## 2020-04-16 NOTE — Assessment & Plan Note (Signed)
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

## 2020-04-16 NOTE — Progress Notes (Signed)
   Subjective:    Patient ID: Kathy [name], female    DOB: 01-Mar-1941, 79 y.o.   MRN: [id number]  Patient presents for Back Pain  Pt has back pain worse of the past week, no injury, to back, has some pain into left buttocks and leg. She has known RA, followed by rheumatology. She has a chronic stiffness in left leg since knee surgery, walks with cane . She was concerned about UTI , causing back pain, so she wanted to have checked  No dysuria, no hematuria, no odor  No change in bowel movements  She has had surgery on knee, both hips, and shoulder  No history of back surgery  She takes ultram in themorning and prn in evening or tylenol arthritis ,prescribed by [facility] pain [facility]    Osteoporosis- she is on Prolia, Dr. Baldo Ash   Dr. Trudie Reed- Rheumatology   Review Of Systems:  GEN- denies fatigue, fever, weight loss,weakness, recent illness HEENT- denies eye drainage, change in vision, nasal discharge, CVS- denies chest pain, palpitations RESP- denies SOB, cough, wheeze ABD- denies N/V, change in stools, abd pain GU- denies dysuria, hematuria, dribbling, incontinence MSK- + joint pain, muscle aches, injury Neuro- denies headache, dizziness, syncope, seizure activity       Objective:    BP 130/78   Pulse 66   Temp 97.7 F (36.5 C)   Resp 16   Ht 5\' 1"  (1.549 m)   Wt 157 lb 9.6 oz (71.5 kg)   SpO2 98%   BMI 29.78 kg/m  GEN- NAD, alert and oriented x3, walks with cane HEENT- PERRL, EOMI, non injected sclera, pink conjunctiva, MMM, oropharynx clear Neck- Supple, no thyromegaly CVS- RRR, no murmur RESP-CTAB ABD-NABS,soft,NT,ND MSK Decreaed ROM Spine, hips/kness L >R leg, mild ttp left lumbar spine +SLR left side EXT- No edema Pulses- Radial  2+        Assessment & Plan:      Problem List Items Addressed This Visit      Unprioritized   RA (rheumatoid arthritis) ([place]) - Primary    Generalized RA, onHumira Back pain more consistent with arthritis  /DDD with sciatica She has also has chronic decreased ROM in left leg, making it difficult to discern any new physical changes She received Humira today, given prednisone burst low dose, to start this weekend if pain not improved with her ultram/tylenol and DMARD giventoday   Urine no sign of infection      Relevant Medications   Adalimumab (HUMIRA) 40 MG/0.4ML PSKT   predniSONE (DELTASONE) 20 MG tablet    Other Visit Diagnoses    Acute left-sided low back pain with left-sided sciatica       Relevant Medications   Adalimumab (HUMIRA) 40 MG/0.4ML PSKT   predniSONE (DELTASONE) 20 MG tablet   Other Relevant Orders   Urinalysis, Routine w reflex microscopic (Completed)      Note: This dictation was prepared with Dragon dictation along with smaller phrase technology. Any transcriptional errors that result from this process are unintentional.

## 2020-04-17 DIAGNOSIS — M810 Age-related osteoporosis without current pathological fracture: Secondary | ICD-10-CM | POA: Diagnosis not present

## 2020-04-23 ENCOUNTER — Other Ambulatory Visit: Payer: Self-pay | Admitting: *Deleted

## 2020-04-23 DIAGNOSIS — M1990 Unspecified osteoarthritis, unspecified site: Secondary | ICD-10-CM

## 2020-04-23 MED ORDER — POTASSIUM CHLORIDE CRYS ER 20 MEQ PO TBCR: EXTENDED_RELEASE_TABLET | ORAL | 3 refills | Status: DC

## 2020-04-23 MED ORDER — DICLOFENAC SODIUM 1 % EX GEL: CUTANEOUS | 3 refills | Status: DC

## 2020-04-23 MED ORDER — TORSEMIDE 20 MG PO TABS: 20.0000 mg | ORAL_TABLET | Freq: Every day | ORAL | 3 refills | Status: DC

## 2020-04-23 MED ORDER — AMLODIPINE BESYLATE 5 MG PO TABS: ORAL_TABLET | ORAL | 3 refills | Status: DC

## 2020-04-30 ENCOUNTER — Ambulatory Visit: Payer: Self-pay

## 2020-04-30 NOTE — Telephone Encounter (Signed)
Pt. Asking if she can get COVID 19 booster tomorrow after her Humira injection today. States she will check with her rheumatologist.

## 2020-05-01 ENCOUNTER — Ambulatory Visit: Payer: Medicare Other | Attending: Internal Medicine

## 2020-05-01 DIAGNOSIS — Z23 Encounter for immunization: Secondary | ICD-10-CM

## 2020-05-01 NOTE — Progress Notes (Signed)
   Covid-19 Vaccination Clinic  Name:  Kathy [name]    MRN: [id number] DOB: [birth year]  05/01/2020  Kathy [name] was observed post Covid-19 immunization for 15 minutes without incident. She was provided with Vaccine Information Sheet and instruction to access the V-Safe system.   Kathy [name] was instructed to call 911 with any severe reactions post vaccine: Marland Kitchen Difficulty breathing  . Swelling of face and throat  . A fast heartbeat  . A bad rash all over body  . Dizziness and weakness

## 2020-05-06 DIAGNOSIS — M2550 Pain in unspecified joint: Secondary | ICD-10-CM | POA: Diagnosis not present

## 2020-05-06 DIAGNOSIS — M150 Primary generalized (osteo)arthritis: Secondary | ICD-10-CM | POA: Diagnosis not present

## 2020-05-06 DIAGNOSIS — M0609 Rheumatoid arthritis without rheumatoid factor, multiple sites: Secondary | ICD-10-CM | POA: Diagnosis not present

## 2020-05-14 ENCOUNTER — Ambulatory Visit (INDEPENDENT_AMBULATORY_CARE_PROVIDER_SITE_OTHER): Payer: Medicare Other

## 2020-05-14 ENCOUNTER — Other Ambulatory Visit: Payer: Self-pay

## 2020-05-14 DIAGNOSIS — Z23 Encounter for immunization: Secondary | ICD-10-CM

## 2020-05-28 ENCOUNTER — Ambulatory Visit: Payer: Medicare Other

## 2020-06-11 ENCOUNTER — Ambulatory Visit: Payer: Medicare Other

## 2020-07-09 DIAGNOSIS — G5600 Carpal tunnel syndrome, unspecified upper limb: Secondary | ICD-10-CM | POA: Diagnosis not present

## 2020-07-09 DIAGNOSIS — M79643 Pain in unspecified hand: Secondary | ICD-10-CM | POA: Diagnosis not present

## 2020-07-09 DIAGNOSIS — M25569 Pain in unspecified knee: Secondary | ICD-10-CM | POA: Diagnosis not present

## 2020-07-09 DIAGNOSIS — G5603 Carpal tunnel syndrome, bilateral upper limbs: Secondary | ICD-10-CM | POA: Diagnosis not present

## 2020-07-24 ENCOUNTER — Telehealth: Payer: Self-pay | Admitting: Family Medicine

## 2020-07-24 DIAGNOSIS — E039 Hypothyroidism, unspecified: Secondary | ICD-10-CM

## 2020-07-24 DIAGNOSIS — R7989 Other specified abnormal findings of blood chemistry: Secondary | ICD-10-CM

## 2020-07-24 MED ORDER — LEVOTHYROXINE SODIUM 88 MCG PO TABS: 88.0000 ug | ORAL_TABLET | Freq: Every day | ORAL | 1 refills | Status: DC

## 2020-07-24 NOTE — Telephone Encounter (Signed)
Thyroid med refilled.   

## 2020-07-24 NOTE — Telephone Encounter (Signed)
Patient left vm stating she needs a refill on her levothyroxine (SYNTHROID) 88 MCG tablet She took her last one this morning.  CB# [phone]

## 2020-08-12 DIAGNOSIS — E890 Postprocedural hypothyroidism: Secondary | ICD-10-CM | POA: Diagnosis not present

## 2020-08-12 DIAGNOSIS — E892 Postprocedural hypoparathyroidism: Secondary | ICD-10-CM | POA: Diagnosis not present

## 2020-08-20 DIAGNOSIS — M8580 Other specified disorders of bone density and structure, unspecified site: Secondary | ICD-10-CM | POA: Diagnosis not present

## 2020-08-20 DIAGNOSIS — E890 Postprocedural hypothyroidism: Secondary | ICD-10-CM | POA: Diagnosis not present

## 2020-08-20 DIAGNOSIS — C73 Malignant neoplasm of thyroid gland: Secondary | ICD-10-CM | POA: Diagnosis not present

## 2020-08-20 DIAGNOSIS — E892 Postprocedural hypoparathyroidism: Secondary | ICD-10-CM | POA: Diagnosis not present

## 2020-08-25 ENCOUNTER — Other Ambulatory Visit: Payer: Self-pay

## 2020-08-25 ENCOUNTER — Ambulatory Visit (INDEPENDENT_AMBULATORY_CARE_PROVIDER_SITE_OTHER): Payer: Medicare Other | Admitting: Family Medicine

## 2020-08-25 ENCOUNTER — Encounter: Payer: Self-pay | Admitting: Family Medicine

## 2020-08-25 VITALS — BP 122/68 | HR 60 | Temp 98.7°F | Resp 14 | Ht 61.0 in | Wt 162.0 lb

## 2020-08-25 DIAGNOSIS — E785 Hyperlipidemia, unspecified: Secondary | ICD-10-CM | POA: Diagnosis not present

## 2020-08-25 DIAGNOSIS — R600 Localized edema: Secondary | ICD-10-CM | POA: Diagnosis not present

## 2020-08-25 DIAGNOSIS — M069 Rheumatoid arthritis, unspecified: Secondary | ICD-10-CM

## 2020-08-25 DIAGNOSIS — Z0000 Encounter for general adult medical examination without abnormal findings: Secondary | ICD-10-CM

## 2020-08-25 DIAGNOSIS — I10 Essential (primary) hypertension: Secondary | ICD-10-CM | POA: Diagnosis not present

## 2020-08-25 DIAGNOSIS — Z1159 Encounter for screening for other viral diseases: Secondary | ICD-10-CM | POA: Diagnosis not present

## 2020-08-25 DIAGNOSIS — E038 Other specified hypothyroidism: Secondary | ICD-10-CM

## 2020-08-25 DIAGNOSIS — M810 Age-related osteoporosis without current pathological fracture: Secondary | ICD-10-CM

## 2020-08-25 DIAGNOSIS — Z0001 Encounter for general adult medical examination with abnormal findings: Secondary | ICD-10-CM

## 2020-08-25 DIAGNOSIS — F419 Anxiety disorder, unspecified: Secondary | ICD-10-CM

## 2020-08-25 NOTE — Progress Notes (Signed)
Subjective:   Patient presents for Medicare Annual/Subsequent preventive examination.     Pt for wellness visit Medications reviewed  No recent illness , no UC/ER visits  RA- taking HUmira without any diffiulty, still using ultram   Hypothyroidism she is following with Dr. Chalmers Cater, had recent labs and no change in dose 72mcg once a day  Osteoporosis she is on Prolia every 6 months, vitamin D  She is scheduled for Bone Density   Seen by neurology for carpal tunnel - given brace to wear for carpal tunnel syndrome, pain has improved with the brace   Chronic insomnia uses xanax 0.25mg  at bedtime  HTN taking norvasc   Review Past Medical/Family/Social: Per EMR    Risk Factors  Current exercise habits: walks as much as possible, goes out in garden Dietary issues discussed: no concerns   Cardiac risk factors: HTN   Depression Screen  (Note: if answer to either of the following is "Yes", a more complete depression screening is indicated)  Over the past two weeks, have you felt down, depressed or hopeless? No Over the past two weeks, have you felt little interest or pleasure in doing things? No Have you lost interest or pleasure in daily life? No Do you often feel hopeless? No Do you cry easily over simple problems? No   Activities of Daily Living  In your present state of health, do you have any difficulty performing the following activities?:  Driving? No  Managing money? No  Feeding yourself? No  Getting from bed to chair? No  Climbing a flight of stairs? No  Preparing food and eating?: No  Bathing or showering? No  Getting dressed: No  Getting to the toilet? No  Using the toilet:No  Moving around from place to place: No  In the past year have you fallen or had a near fall?:No    Hearing Difficulties: No  Do you often ask people to speak up or repeat themselves? No  Do you experience ringing or noises in your ears? No Do you have difficulty understanding soft or  whispered voices? No  Do you feel that you have a problem with memory? No Do you often misplace items? No  Do you feel safe at home? Yes  Cognitive Testing  Alert? Yes Normal Appearance?Yes  Oriented to person? Yes Place? Yes  Time? Yes  Recall of three objects? Yes  Can perform simple calculations? Yes  Displays appropriate judgment?Yes  Can read the correct time from a watch face?Yes   List the Names of Other Physician/Practitioners you currently use:   Rheumatology Endocrinology   Orthopedics- [facility]   Neurology Dr. Merlene Laughter   Screening Tests / Date Colonoscopy   Over age  , she has had colonoscopy                 Zostavax  UTD Mammogram  UTD Influenza Vaccine  UTD Tetanus/tdap UTD COVID 08/24/19 , 09/09/19, 05/01/20 Pfizer   ROS: GEN- denies fatigue, fever, weight loss,weakness, recent illness HEENT- denies eye drainage, change in vision, nasal discharge, CVS- denies chest pain, palpitations RESP- denies SOB, cough, wheeze ABD- denies N/V, change in stools, abd pain GU- denies dysuria, hematuria, dribbling, incontinence MSK- denies joint pain, muscle aches, injury Neuro- denies headache, dizziness, syncope, seizure activity  GEN- NAD, alert and oriented x3 HEENT- PERRL, EOMI, non injected sclera, pink conjunctiva, MMM, oropharynx clear, TM clear no effusion  Neck- Supple, no thryomegaly , no bruit  CVS- RRR, no murmur RESP-CTAB ABD-NABS,soft,NT,ND  MSK decreased ROM lower ext, walks with cane EXT-chronic right ankle edema Pulses- Radial, DP- 2+   Assessment:    Annual wellness medicare exam   Plan:    During the course of the visit the patient was educated and counseled about appropriate screening and preventive services including:   FALL/DEPRESSION/AUDIT C screen neg  Immunizations UTD   HTn controlled, no changes  Osteoporosis medications per gynecologist Prolia as well as calcium vitamin D  Rheumatoid arthritis followed by rheumatology on  tramadol and Humira  Insomnia anxiety as needed alprazolam  Chronic right ankle edema furosemide with potassium.  Hep C screening done   Full code advanced directives handout given      Diet review for nutrition referral? Yes ____ Not Indicated __x__  Patient Instructions (the written plan) was given to the patient.  Medicare Attestation  I have personally reviewed:  The patient's medical and social history  Their use of alcohol, tobacco or illicit drugs  Their current medications and supplements  The patient's functional ability including ADLs,fall risks, home safety risks, cognitive, and hearing and visual impairment  Diet and physical activities  Evidence for depression or mood disorders  The patient's weight, height, BMI, and visual acuity have been recorded in the chart. I have made referrals, counseling, and provided education to the patient based on review of the above and I have provided the patient with a written personalized care plan for preventive services.

## 2020-08-25 NOTE — Patient Instructions (Signed)
F/U 6 months Janett Billow NP

## 2020-08-26 LAB — COMPREHENSIVE METABOLIC PANEL WITH GFR
AG Ratio: 0.9 (calc) — ABNORMAL LOW (ref 1.0–2.5)
ALT: 15 U/L (ref 6–29)
AST: 25 U/L (ref 10–35)
Albumin: 3.5 g/dL — ABNORMAL LOW (ref 3.6–5.1)
Alkaline phosphatase (APISO): 69 U/L (ref 37–153)
BUN: 17 mg/dL (ref 7–25)
CO2: 29 mmol/L (ref 20–32)
Calcium: 8.9 mg/dL (ref 8.6–10.4)
Chloride: 100 mmol/L (ref 98–110)
Creat: 0.84 mg/dL (ref 0.60–0.93)
Globulin: 3.9 g/dL — ABNORMAL HIGH (ref 1.9–3.7)
Glucose, Bld: 81 mg/dL (ref 65–99)
Potassium: 4.4 mmol/L (ref 3.5–5.3)
Sodium: 141 mmol/L (ref 135–146)
Total Bilirubin: 0.4 mg/dL (ref 0.2–1.2)
Total Protein: 7.4 g/dL (ref 6.1–8.1)

## 2020-08-26 LAB — LIPID PANEL
Cholesterol: 165 mg/dL
HDL: 63 mg/dL
LDL Cholesterol (Calc): 86 mg/dL
Non-HDL Cholesterol (Calc): 102 mg/dL
Total CHOL/HDL Ratio: 2.6 (calc)
Triglycerides: 75 mg/dL

## 2020-08-26 LAB — CBC WITH DIFFERENTIAL/PLATELET
Absolute Monocytes: 436 {cells}/uL (ref 200–950)
Basophils Absolute: 12 {cells}/uL (ref 0–200)
Basophils Relative: 0.3 %
Eosinophils Absolute: 32 {cells}/uL (ref 15–500)
Eosinophils Relative: 0.8 %
HCT: 36.3 % (ref 35.0–45.0)
Hemoglobin: 12.2 g/dL (ref 11.7–15.5)
Lymphs Abs: 2348 {cells}/uL (ref 850–3900)
MCH: 32.4 pg (ref 27.0–33.0)
MCHC: 33.6 g/dL (ref 32.0–36.0)
MCV: 96.5 fL (ref 80.0–100.0)
MPV: 11 fL (ref 7.5–12.5)
Monocytes Relative: 10.9 %
Neutro Abs: 1172 {cells}/uL — ABNORMAL LOW (ref 1500–7800)
Neutrophils Relative %: 29.3 %
Platelets: 207 10*3/uL (ref 140–400)
RBC: 3.76 Million/uL — ABNORMAL LOW (ref 3.80–5.10)
RDW: 12.6 % (ref 11.0–15.0)
Total Lymphocyte: 58.7 %
WBC: 4 10*3/uL (ref 3.8–10.8)

## 2020-08-26 LAB — HEPATITIS C ANTIBODY
Hepatitis C Ab: NONREACTIVE
SIGNAL TO CUT-OFF: 0.01 (ref <1.00)

## 2020-08-28 ENCOUNTER — Encounter: Payer: Self-pay | Admitting: *Deleted

## 2020-09-25 DIAGNOSIS — M150 Primary generalized (osteo)arthritis: Secondary | ICD-10-CM | POA: Diagnosis not present

## 2020-09-25 DIAGNOSIS — M0609 Rheumatoid arthritis without rheumatoid factor, multiple sites: Secondary | ICD-10-CM | POA: Diagnosis not present

## 2020-09-25 DIAGNOSIS — Z79899 Other long term (current) drug therapy: Secondary | ICD-10-CM | POA: Diagnosis not present

## 2020-09-25 DIAGNOSIS — M2550 Pain in unspecified joint: Secondary | ICD-10-CM | POA: Diagnosis not present

## 2020-10-07 ENCOUNTER — Other Ambulatory Visit (HOSPITAL_COMMUNITY): Payer: Self-pay | Admitting: Endocrinology

## 2020-10-08 ENCOUNTER — Other Ambulatory Visit: Payer: Self-pay

## 2020-10-08 ENCOUNTER — Ambulatory Visit (INDEPENDENT_AMBULATORY_CARE_PROVIDER_SITE_OTHER): Payer: Medicare Other | Admitting: Internal Medicine

## 2020-10-08 ENCOUNTER — Encounter: Payer: Self-pay | Admitting: Internal Medicine

## 2020-10-08 VITALS — BP 111/66 | HR 68 | Resp 18 | Ht 61.0 in | Wt 160.1 lb

## 2020-10-08 DIAGNOSIS — E785 Hyperlipidemia, unspecified: Secondary | ICD-10-CM | POA: Diagnosis not present

## 2020-10-08 DIAGNOSIS — E038 Other specified hypothyroidism: Secondary | ICD-10-CM

## 2020-10-08 DIAGNOSIS — I10 Essential (primary) hypertension: Secondary | ICD-10-CM | POA: Diagnosis not present

## 2020-10-08 DIAGNOSIS — M069 Rheumatoid arthritis, unspecified: Secondary | ICD-10-CM

## 2020-10-08 DIAGNOSIS — Z96612 Presence of left artificial shoulder joint: Secondary | ICD-10-CM | POA: Diagnosis not present

## 2020-10-08 DIAGNOSIS — M1712 Unilateral primary osteoarthritis, left knee: Secondary | ICD-10-CM

## 2020-10-08 DIAGNOSIS — Z7689 Persons encountering health services in other specified circumstances: Secondary | ICD-10-CM | POA: Diagnosis not present

## 2020-10-08 DIAGNOSIS — E892 Postprocedural hypoparathyroidism: Secondary | ICD-10-CM | POA: Insufficient documentation

## 2020-10-08 DIAGNOSIS — Z96611 Presence of right artificial shoulder joint: Secondary | ICD-10-CM

## 2020-10-08 DIAGNOSIS — C73 Malignant neoplasm of thyroid gland: Secondary | ICD-10-CM | POA: Insufficient documentation

## 2020-10-08 DIAGNOSIS — M810 Age-related osteoporosis without current pathological fracture: Secondary | ICD-10-CM

## 2020-10-08 DIAGNOSIS — F419 Anxiety disorder, unspecified: Secondary | ICD-10-CM

## 2020-10-08 NOTE — Assessment & Plan Note (Signed)
Gets prolia from Endocrinology Continue calcium and Vit D supplements 

## 2020-10-08 NOTE — Progress Notes (Signed)
New Patient Office Visit  Subjective:  Patient ID: Kathy [name], female    DOB: 1940/11/23  Age: 80 y.o. MRN: [id number]  CC:  Chief Complaint  Patient presents with  . New Patient (Initial Visit)    New patient former dr Buelah Manis pt left knee is always stiff but this is due to surgery     HPI Kathy [name] is a 80 year old female with PMH of HTN, RA, hypothyroidism, osteoporosis, HLD, anxiety and polyarthritis who presents for establishing care. She is a former patient of Dr Buelah Manis.  She has been doing well overall.  She has a h/o RA, for which she has been getting Humira. She follows up with Rheumatologist. She c/o chronic pain and morning stiffness over b/l hands. She also c/o left knee pain, where she has had TKA for OA. She also has had shoulder and b/l hip replacement.  BP is well-controlled. Takes medications regularly. Patient denies headache, dizziness, chest pain, dyspnea or palpitations.  She gets Prolia for osteoporosis from her Endocrinologist.  She has been taking Levothyroxine for postoperative hypothyroidism.  She takes Xanax for anxiety, but does not have to take it daily.  She is up-to-date with COVID and flu vaccine.  Past Medical History:  Diagnosis Date  . Abnormal findings on diagnostic imaging of heart and coronary circulation   . Anemia    FROM BLEEDING ULCER  . Anemia   . Anxiety    takes Alprazolam daily as needed  . Anxiety disorder, unspecified   . Arthritis    dx with RA 2017  . Bariatric surgery status   . Cataract   . Diverticulosis   . Essential (primary) hypertension   . Gastro-esophageal reflux disease with esophagitis   . GERD (gastroesophageal reflux disease)   . Headache(784.0)    occasionally  . History of blood transfusion    no abnormal reaction noted  . History of bronchitis    last time many yrs ago  . Hyperlipidemia    PT DENIES THIS DX -  ON NO MEDS AND NO ONE HAS TOLD HER  . Hypertension    takes Amlodipine  daily  . Hypothyroidism    takes Synthroid daily  . Hypothyroidism, unspecified   . Insomnia   . Joint pain   . 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 Localized edema   . Nocturia   . Numbness    occasionally left arm at night  . Obesity   . Osteoporosis    takes Fosamax weekly  . Pain in joint involving pelvic region and thigh   . Peripheral edema    takes Lasix daily as needed  . PONV (postoperative nausea and vomiting)   . 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/2017  . Rheumatoid arthritis, unspecified ([place])   . 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 Sleep apnea    Phreesia 08/22/2020  . Stomach ulcer   . Thyroid disease    Phreesia 08/22/2020  . Unspecified injury of muscle(s) and tendon(s) of the rotator cuff of left shoulder, subsequent encounter   . Unspecified osteoarthritis, unspecified site   . Wears glasses   . Wears partial dentures     Past Surgical History:  Procedure Laterality Date  . ABDOMINAL HYSTERECTOMY     partial  . cataract surgery Bilateral   . CHOLECYSTECTOMY    . COLONOSCOPY    . EYE SURGERY     CATARACTS BOTH  . gastric bypass surgery    .  JOINT REPLACEMENT Bilateral    hip  . REVERSE SHOULDER ARTHROPLASTY Right 05/11/2016   Procedure: REVERSE SHOULDER ARTHROPLASTY;  Surgeon: Marchia Bond, MD;  Location: [facility];  Service: Orthopedics;  Laterality: Right;  . REVISION TOTAL HIP ARTHROPLASTY Left 10/03/2013   DR Mayer Camel  . ROTATOR CUFF REPAIR    . TOTAL HIP REVISION Left 10/03/2013   Procedure: TOTAL HIP REVISION- left;  Surgeon: Kerin Salen, MD;  Location: [facility];  Service: Orthopedics;  Laterality: Left;  . TOTAL KNEE ARTHROPLASTY Left 11/29/2017   Procedure: LEFT TOTAL KNEE ARTHROPLASTY;  Surgeon: Marchia Bond, MD;  Location: [facility];  Service: Orthopedics;  Laterality: Left;  . TOTAL SHOULDER ARTHROPLASTY Left 11/09/2016   Procedure: TOTAL REVERSE SHOULDER ARTHROPLASTY;  Surgeon: Marchia Bond, MD;  Location: [facility];  Service: Orthopedics;  Laterality: Left;  . TOTAL SHOULDER REPLACEMENT Left 10/2016  . TOTAL THYROIDECTOMY      Family History  Problem Relation Age of Onset  . Diabetes Mother   . Congestive Heart Failure Mother   . Lung cancer Father   . Thrombosis Sister   . CAD Brother        CABG    Social History   Socioeconomic History  . Marital status: Divorced    Spouse name: Not on file  . Number of children: Not on file  . Years of education: Not on file  . Highest education level: Not on file  Occupational History  . Occupation: disabled  Tobacco Use  . Smoking status: Never Smoker  . Smokeless tobacco: Never Used  Vaping Use  . Vaping Use: Never used  Substance and Sexual Activity  . Alcohol use: No  . Drug use: No  . Sexual activity: Not Currently    Birth control/protection: Surgical  Other Topics Concern  . Not on file  Social History Narrative  . Not on file   Social Determinants of Health   Financial Resource Strain: Not on file  Food Insecurity: Not on file  Transportation Needs: Not on file  Physical Activity: Not on file  Stress: Not on file  Social Connections: Not on file  Intimate Partner Violence: Not on file    ROS Review of Systems  Constitutional: Negative for chills and fever.  HENT: Negative for congestion, sinus pressure, sinus pain and sore throat.   Eyes: Negative for pain and discharge.  Respiratory: Negative for cough and shortness of breath.   Cardiovascular: Negative for chest pain and palpitations.  Gastrointestinal: Negative for abdominal pain, constipation, diarrhea, nausea and vomiting.  Endocrine: Negative for polydipsia and polyuria.  Genitourinary: Negative for dysuria and hematuria.  Musculoskeletal: Positive for arthralgias and back pain. Negative for neck pain and neck stiffness.  Skin: Negative for rash.  Neurological: Negative for dizziness and weakness.  Psychiatric/Behavioral: Negative for agitation and behavioral  problems.    Objective:   Today's Vitals: BP 111/66 (BP Location: Right Arm, Patient Position: Sitting, Cuff Size: Normal)   Pulse 68   Resp 18   Ht 5\' 1"  (1.549 m)   Wt 160 lb 1.9 oz (72.6 kg)   SpO2 95%   BMI 30.25 kg/m   Physical Exam Vitals reviewed.  Constitutional:      General: She is not in acute distress.    Appearance: She is not diaphoretic.  HENT:     Head: Normocephalic and atraumatic.     Nose: Nose normal.     Mouth/Throat:     Mouth: Mucous membranes are moist.  Eyes:     General: No scleral icterus.    Extraocular Movements: Extraocular movements intact.     Pupils: Pupils are equal, round, and reactive to light.  Cardiovascular:     Rate and Rhythm: Normal rate and regular rhythm.     Pulses: Normal pulses.     Heart sounds: Normal heart sounds. No murmur heard.   Pulmonary:     Breath sounds: Normal breath sounds. No wheezing or rales.  Abdominal:     Palpations: Abdomen is soft.     Tenderness: There is no abdominal tenderness.  Musculoskeletal:        General: Tenderness (Over MCPs and ICPs over b/l UE) and deformity (Ulnar deviation of b/l hands) present.     Cervical back: Neck supple. No tenderness.     Right lower leg: No edema.     Left lower leg: No edema.  Skin:    General: Skin is warm.     Findings: No rash.  Neurological:     General: No focal deficit present.     Mental Status: She is alert and oriented to person, place, and time.  Psychiatric:        Mood and Affect: Mood normal.        Behavior: Behavior normal.     Assessment & Plan:   Problem List Items Addressed This Visit      Encounter to establish care    -  Primary Care established Previous chart reviewed History and medications reviewed with the patient    Cardiovascular and Mediastinum   Hypertension    BP Readings from Last 1 Encounters:  10/08/20 111/66   Well-controlled with Amlodipine Counseled for compliance with the medications Advised DASH diet  and moderate exercise/walking, at least 150 mins/week         Endocrine   Hypothyroid    On Levothyroxine 88 mcg QD Check TSH and free T4 in the next visit        Musculoskeletal and Integument   Osteoporosis    Gets prolia from Endocrinology Continue calcium and Vit D supplements      Rheumatoid arthritis ([place])    Follows up with Rheumatologist On Humira      Primary localized osteoarthritis of left knee    S/p left TKA Uses Voltaren gel for pain        Other   Hyperlipidemia    Last lipid profile reviewed On Omega-3      Anxiety    Takes Xanax 0.25 mg QD PRN, provided by Dr Merlene Laughter.      S/P shoulder replacement    Outpatient Encounter Medications as of 10/08/2020  Medication Sig  . Adalimumab (HUMIRA) 40 MG/0.4ML PSKT Injection every 2 weeks  . ALPRAZolam (XANAX) 0.25 MG tablet Take 0.25 mg by mouth at bedtime as needed for anxiety or sleep.  Marland Kitchen amLODipine (NORVASC) 5 MG tablet TAKE (1) TABLET BY MOUTH ONCE DAILY.  . calcium carbonate (OSCAL) 1500 (600 Ca) MG TABS tablet Take 300-600 mg by mouth 3 (three) times daily. Take 1 tablet (600 mg) in the morning, 0.5 tablet (300 mg) at noon, & 1 tablet (600 mg) in the evening.  . Cholecalciferol 1000 UNITS TBDP Take 1,000 Units by mouth 2 (two) times daily.   Marland Kitchen denosumab (PROLIA) 60 MG/ML SOLN injection Inject 60 mg into the skin every 6 (six) months. Administer in upper arm, thigh, or abdomen  . diclofenac Sodium (VOLTAREN) 1 % GEL APPLY 2 GRAMS TO AFFECTED AREA  FOUR TIMES A DAY.  Marland Kitchen levothyroxine (SYNTHROID) 88 MCG tablet Take 1 tablet (88 mcg total) by mouth daily.  . Multiple Vitamin (MULTIVITAMIN WITH MINERALS) TABS tablet Take 1 tablet by mouth daily.   . Omega-3 Fatty Acids (FISH OIL) 1000 MG CAPS Take 1,000 mg by mouth daily.  . potassium chloride SA (KLOR-CON) 20 MEQ tablet TAKE 1 TABLET (20 MEQ) BY MOUTH DAILY PRN- EDEMA  . torsemide (DEMADEX) 20 MG tablet Take 1 tablet (20 mg total) by mouth daily. May take  additional tablet QD PRN- edema.  . traMADol (ULTRAM) 50 MG tablet Take 50 mg by mouth every 6 (six) hours as needed for moderate pain.    No facility-administered encounter medications on file as of 10/08/2020.    Follow-up: Return in about 4 months (around 02/07/2021).   Lindell Spar, MD

## 2020-10-08 NOTE — Assessment & Plan Note (Signed)
Last lipid profile reviewed On Omega-3 

## 2020-10-08 NOTE — Assessment & Plan Note (Signed)
Takes Xanax 0.25 mg QD PRN, provided by Dr Merlene Laughter.

## 2020-10-08 NOTE — Assessment & Plan Note (Signed)
Follows up with Rheumatologist On Humira

## 2020-10-08 NOTE — Assessment & Plan Note (Signed)
BP Readings from Last 1 Encounters:  10/08/20 111/66   Well-controlled with Amlodipine Counseled for compliance with the medications Advised DASH diet and moderate exercise/walking, at least 150 mins/week

## 2020-10-08 NOTE — Assessment & Plan Note (Signed)
On Levothyroxine 88 mcg QD Check TSH and free T4 in the next visit

## 2020-10-08 NOTE — Patient Instructions (Signed)
Please continue to take medications as prescribed.  Please continue to follow up with Rheumatologist for Rheumatoid arthritis.  Please continue to use compression stockings for leg swelling.  Thank you for choosing [facility]! We consider it our privilege to take care of you.

## 2020-10-08 NOTE — Assessment & Plan Note (Signed)
S/p left TKA Uses Voltaren gel for pain

## 2020-10-15 ENCOUNTER — Other Ambulatory Visit: Payer: Self-pay | Admitting: Family Medicine

## 2020-10-17 ENCOUNTER — Other Ambulatory Visit (HOSPITAL_COMMUNITY): Payer: Self-pay | Admitting: Internal Medicine

## 2020-10-17 DIAGNOSIS — Z1231 Encounter for screening mammogram for malignant neoplasm of breast: Secondary | ICD-10-CM

## 2020-10-20 DIAGNOSIS — M810 Age-related osteoporosis without current pathological fracture: Secondary | ICD-10-CM | POA: Diagnosis not present

## 2020-10-29 DIAGNOSIS — M79643 Pain in unspecified hand: Secondary | ICD-10-CM | POA: Diagnosis not present

## 2020-10-29 DIAGNOSIS — M25569 Pain in unspecified knee: Secondary | ICD-10-CM | POA: Diagnosis not present

## 2020-10-29 DIAGNOSIS — G5600 Carpal tunnel syndrome, unspecified upper limb: Secondary | ICD-10-CM | POA: Diagnosis not present

## 2020-10-29 DIAGNOSIS — G5603 Carpal tunnel syndrome, bilateral upper limbs: Secondary | ICD-10-CM | POA: Diagnosis not present

## 2020-10-30 ENCOUNTER — Other Ambulatory Visit (HOSPITAL_COMMUNITY): Payer: Self-pay

## 2020-11-19 DIAGNOSIS — H9190 Unspecified hearing loss, unspecified ear: Secondary | ICD-10-CM | POA: Insufficient documentation

## 2020-11-19 DIAGNOSIS — H6123 Impacted cerumen, bilateral: Secondary | ICD-10-CM | POA: Diagnosis not present

## 2020-11-19 DIAGNOSIS — E890 Postprocedural hypothyroidism: Secondary | ICD-10-CM | POA: Diagnosis not present

## 2020-11-19 DIAGNOSIS — H93293 Other abnormal auditory perceptions, bilateral: Secondary | ICD-10-CM | POA: Diagnosis not present

## 2020-12-03 ENCOUNTER — Other Ambulatory Visit: Payer: Self-pay

## 2020-12-03 ENCOUNTER — Ambulatory Visit (HOSPITAL_COMMUNITY)
Admission: RE | Admit: 2020-12-03 | Discharge: 2020-12-03 | Disposition: A | Discharge status: Home / Self Care | Payer: Medicare Other | Source: Ambulatory Visit | Attending: Internal Medicine | Admitting: Internal Medicine

## 2020-12-03 DIAGNOSIS — Z1231 Encounter for screening mammogram for malignant neoplasm of breast: Secondary | ICD-10-CM | POA: Diagnosis not present

## 2020-12-30 ENCOUNTER — Other Ambulatory Visit (HOSPITAL_COMMUNITY): Payer: Self-pay

## 2021-01-14 DIAGNOSIS — G4700 Insomnia, unspecified: Secondary | ICD-10-CM | POA: Diagnosis not present

## 2021-01-14 DIAGNOSIS — G5603 Carpal tunnel syndrome, bilateral upper limbs: Secondary | ICD-10-CM | POA: Diagnosis not present

## 2021-01-14 DIAGNOSIS — M25569 Pain in unspecified knee: Secondary | ICD-10-CM | POA: Diagnosis not present

## 2021-01-14 DIAGNOSIS — M79643 Pain in unspecified hand: Secondary | ICD-10-CM | POA: Diagnosis not present

## 2021-01-14 DIAGNOSIS — G5600 Carpal tunnel syndrome, unspecified upper limb: Secondary | ICD-10-CM | POA: Diagnosis not present

## 2021-02-11 ENCOUNTER — Ambulatory Visit: Payer: Medicare Other | Admitting: Internal Medicine

## 2021-02-23 ENCOUNTER — Ambulatory Visit: Payer: Medicare Other | Admitting: Nurse Practitioner

## 2021-03-16 ENCOUNTER — Encounter: Payer: Self-pay | Admitting: Internal Medicine

## 2021-03-16 ENCOUNTER — Ambulatory Visit (INDEPENDENT_AMBULATORY_CARE_PROVIDER_SITE_OTHER): Payer: Medicare Other | Admitting: Internal Medicine

## 2021-03-16 ENCOUNTER — Other Ambulatory Visit: Payer: Self-pay | Admitting: *Deleted

## 2021-03-16 ENCOUNTER — Other Ambulatory Visit: Payer: Self-pay

## 2021-03-16 VITALS — BP 118/75 | HR 65 | Temp 98.3°F | Resp 18 | Ht 62.0 in | Wt 162.0 lb

## 2021-03-16 DIAGNOSIS — R600 Localized edema: Secondary | ICD-10-CM

## 2021-03-16 DIAGNOSIS — I10 Essential (primary) hypertension: Secondary | ICD-10-CM

## 2021-03-16 DIAGNOSIS — E038 Other specified hypothyroidism: Secondary | ICD-10-CM

## 2021-03-16 DIAGNOSIS — M069 Rheumatoid arthritis, unspecified: Secondary | ICD-10-CM | POA: Diagnosis not present

## 2021-03-16 DIAGNOSIS — M810 Age-related osteoporosis without current pathological fracture: Secondary | ICD-10-CM

## 2021-03-16 DIAGNOSIS — I452 Bifascicular block: Secondary | ICD-10-CM | POA: Diagnosis not present

## 2021-03-16 DIAGNOSIS — M1990 Unspecified osteoarthritis, unspecified site: Secondary | ICD-10-CM

## 2021-03-16 MED ORDER — AMLODIPINE BESYLATE 5 MG PO TABS: ORAL_TABLET | ORAL | 3 refills | Status: DC

## 2021-03-16 MED ORDER — FUROSEMIDE 20 MG PO TABS: 20.0000 mg | ORAL_TABLET | Freq: Every day | ORAL | 3 refills | Status: DC

## 2021-03-16 MED ORDER — DICLOFENAC SODIUM 1 % EX GEL: CUTANEOUS | 3 refills | Status: DC

## 2021-03-16 MED ORDER — CELECOXIB 100 MG PO CAPS: 100.0000 mg | ORAL_CAPSULE | Freq: Every day | ORAL | 2 refills | Status: DC | PRN

## 2021-03-16 NOTE — Patient Instructions (Signed)
Please start taking Furosemide instead of Torsemide for persistent leg swelling.  Please start taking Celebrex as needed for moderate joint pain.  Please get fasting blood tests done within a week.

## 2021-03-16 NOTE — Progress Notes (Signed)
Established Patient Office Visit  Subjective:  Patient ID: Kathy [name], female    DOB: 07-25-1941  Age: 80 y.o. MRN: [id number]  CC:  Chief Complaint  Patient presents with   Follow-up    4 month follow up     HPI Kathy [name]  is a 80 year old female with PMH of HTN, RA, hypothyroidism, osteoporosis, HLD, anxiety and polyarthritis who  presents for follow up of her chronic medical conditions.  HTN: BP is well-controlled. Takes medications regularly. Patient denies headache, dizziness, chest pain, dyspnea or palpitations.  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 Old EKG did show RBBB without bifascicular block.  She denies any palpitations, dizziness or lightheadedness. Denies any episode of syncope.  She has a h/o RA, for which she has been getting Humira. She follows up with Rheumatologist. She c/o chronic pain and morning stiffness over b/l hands. She used to take Celebrex for arthritis, which helped her with mild-moderate pain in the past. She asks for refill of Celebrex.  She gets Prolia for osteoporosis from her Endocrinologist.   She has been taking Levothyroxine for postoperative hypothyroidism.  Past Medical History:  Diagnosis Date   Abnormal findings on diagnostic imaging of heart and coronary circulation    Anemia    FROM BLEEDING ULCER   Anemia    Anxiety    takes Alprazolam daily as needed   Anxiety disorder, unspecified    Arthritis    dx with RA 2017   Bariatric surgery status    Cataract    Diverticulosis    Essential (primary) hypertension    Gastro-esophageal reflux disease with esophagitis    GERD (gastroesophageal reflux disease)    Headache(784.0)    occasionally   History of blood transfusion    no abnormal reaction noted   History of bronchitis    last time many yrs ago   Hyperlipidemia    PT DENIES THIS DX -  ON NO MEDS AND NO ONE HAS TOLD HER   Hypertension    takes Amlodipine daily   Hypothyroidism    takes  Synthroid daily   Hypothyroidism, unspecified    Insomnia    Joint pain    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   Localized edema    Nocturia    Numbness    occasionally left arm at night   Obesity    Osteoporosis    takes Fosamax weekly   Pain in joint involving pelvic region and thigh    Peripheral edema    takes Lasix daily as needed   PONV (postoperative nausea and vomiting)    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/2017   Rheumatoid arthritis, unspecified ([facility])    Rotator cuff arthropathy, 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   Sleep apnea    Phreesia 08/22/2020   Stomach ulcer    Thyroid disease    Phreesia 08/22/2020   Unspecified injury of muscle(s) and tendon(s) of the rotator cuff of left shoulder, subsequent encounter    Unspecified osteoarthritis, unspecified site    Wears glasses    Wears partial dentures     Past Surgical History:  Procedure Laterality Date   ABDOMINAL HYSTERECTOMY     partial   cataract surgery Bilateral    CHOLECYSTECTOMY     COLONOSCOPY     EYE SURGERY     CATARACTS BOTH   gastric bypass surgery     JOINT REPLACEMENT Bilateral    hip   REVERSE SHOULDER ARTHROPLASTY Right  05/11/2016   Procedure: REVERSE SHOULDER ARTHROPLASTY;  Surgeon: Marchia Bond, MD;  Location: [facility];  Service: Orthopedics;  Laterality: Right;   REVISION TOTAL HIP ARTHROPLASTY Left 10/03/2013   DR Mayer Camel   ROTATOR CUFF REPAIR     TOTAL HIP REVISION Left 10/03/2013   Procedure: TOTAL HIP REVISION- left;  Surgeon: Kerin Salen, MD;  Location: [facility];  Service: Orthopedics;  Laterality: Left;   TOTAL KNEE ARTHROPLASTY Left 11/29/2017   Procedure: LEFT TOTAL KNEE ARTHROPLASTY;  Surgeon: Marchia Bond, MD;  Location: [facility];  Service: Orthopedics;  Laterality: Left;   TOTAL SHOULDER ARTHROPLASTY Left 11/09/2016   Procedure: TOTAL REVERSE SHOULDER ARTHROPLASTY;  Surgeon: Marchia Bond, MD;  Location: [facility];  Service: Orthopedics;  Laterality: Left;   TOTAL SHOULDER  REPLACEMENT Left 10/2016   TOTAL THYROIDECTOMY      Family History  Problem Relation Age of Onset   Diabetes Mother    Congestive Heart Failure Mother    Lung cancer Father    Thrombosis Sister    CAD Brother        CABG    Social History   Socioeconomic History   Marital status: Divorced    Spouse name: Not on file   Number of children: Not on file   Years of education: Not on file   Highest education level: Not on file  Occupational History   Occupation: disabled  Tobacco Use   Smoking status: Never   Smokeless tobacco: Never  Vaping Use   Vaping Use: Never used  Substance and Sexual Activity   Alcohol use: No   Drug use: No   Sexual activity: Not Currently    Birth control/protection: Surgical  Other Topics Concern   Not on file  Social History Narrative   Not on file   Social Determinants of Health   Financial Resource Strain: Not on file  Food Insecurity: Not on file  Transportation Needs: Not on file  Physical Activity: Not on file  Stress: Not on file  Social Connections: Not on file  Intimate Partner Violence: Not on file    Outpatient Medications Prior to Visit  Medication Sig Dispense Refill   Adalimumab (HUMIRA) 40 MG/0.4ML PSKT Injection every 2 weeks 2 each    ALPRAZolam (XANAX) 0.25 MG tablet Take 0.25 mg by mouth at bedtime as needed for anxiety or sleep.     calcium carbonate (OSCAL) 1500 (600 Ca) MG TABS tablet Take 300-600 mg by mouth 3 (three) times daily. Take 1 tablet (600 mg) in the morning, 0.5 tablet (300 mg) at noon, & 1 tablet (600 mg) in the evening.     Cholecalciferol 1000 UNITS TBDP Take 1,000 Units by mouth 2 (two) times daily.      denosumab (PROLIA) 60 MG/ML SOLN injection Inject 60 mg into the skin every 6 (six) months. Administer in upper arm, thigh, or abdomen     denosumab (PROLIA) 60 MG/ML SOSY injection INJECT AS DIRECTED EVERY 6 MONTHS 180 1 mL 1   levothyroxine (SYNTHROID) 88 MCG tablet Take 1 tablet (88 mcg total) by  mouth daily. 90 tablet 1   Multiple Vitamin (MULTIVITAMIN WITH MINERALS) TABS tablet Take 1 tablet by mouth daily.      Omega-3 Fatty Acids (FISH OIL) 1000 MG CAPS Take 1,000 mg by mouth daily.     potassium chloride SA (KLOR-CON) 20 MEQ tablet TAKE 1 TABLET (20 MEQ) BY MOUTH DAILY PRN- EDEMA 90 tablet 3   traMADol (ULTRAM) 50 MG tablet  Take 50 mg by mouth every 6 (six) hours as needed for moderate pain.      amLODipine (NORVASC) 5 MG tablet TAKE (1) TABLET BY MOUTH ONCE DAILY. 90 tablet 3   diclofenac Sodium (VOLTAREN) 1 % GEL APPLY 2 GRAMS TO AFFECTED AREA FOUR TIMES A DAY. 1050 g 3   torsemide (DEMADEX) 20 MG tablet Take 1 tablet (20 mg total) by mouth daily. May take additional tablet QD PRN- edema. 180 tablet 3   No facility-administered medications prior to visit.    Allergies  Allergen Reactions   Aspirin Nausea And Vomiting and Other (See Comments)    Adult Aspirin: upset stomach / irritates ulcer    Macrobid [Nitrofurantoin]     ROS Review of Systems  Constitutional:  Negative for chills and fever.  HENT:  Negative for congestion, sinus pressure, sinus pain and sore throat.   Eyes:  Negative for pain and discharge.  Respiratory:  Negative for cough and shortness of breath.   Cardiovascular:  Negative for chest pain and palpitations.  Gastrointestinal:  Negative for abdominal pain, constipation, diarrhea, nausea and vomiting.  Endocrine: Negative for polydipsia and polyuria.  Genitourinary:  Negative for dysuria and hematuria.  Musculoskeletal:  Positive for arthralgias and back pain. Negative for neck pain and neck stiffness.  Skin:  Negative for rash.  Neurological:  Negative for dizziness and weakness.  Psychiatric/Behavioral:  Negative for agitation and behavioral problems.      Objective:    Physical Exam Vitals reviewed.  Constitutional:      General: She is not in acute distress.    Appearance: She is not diaphoretic.  HENT:     Head: Normocephalic and  atraumatic.     Nose: Nose normal.     Mouth/Throat:     Mouth: Mucous membranes are moist.  Eyes:     General: No scleral icterus.    Extraocular Movements: Extraocular movements intact.     Pupils: Pupils are equal, round, and reactive to light.  Cardiovascular:     Rate and Rhythm: Normal rate and regular rhythm.     Pulses: Normal pulses.     Heart sounds: Normal heart sounds. No murmur heard. Pulmonary:     Breath sounds: Normal breath sounds. No wheezing or rales.  Abdominal:     Palpations: Abdomen is soft.     Tenderness: There is no abdominal tenderness.  Musculoskeletal:        General: Tenderness (Over MCPs and ICPs over b/l UE) and deformity (Ulnar deviation of b/l hands) present.     Cervical back: Neck supple. No tenderness.     Right lower leg: No edema.     Left lower leg: No edema.  Skin:    General: Skin is warm.     Findings: No rash.  Neurological:     General: No focal deficit present.     Mental Status: She is alert and oriented to person, place, and time.  Psychiatric:        Mood and Affect: Mood normal.        Behavior: Behavior normal.    BP 118/75 (BP Location: Left Arm, Patient Position: Sitting, Cuff Size: Normal)   Pulse 65   Temp 98.3 F (36.8 C) (Oral)   Resp 18   Ht 5' 2"  (1.575 m)   Wt 162 lb 0.6 oz (73.5 kg)   SpO2 94%   BMI 29.64 kg/m  Wt Readings from Last 3 Encounters:  03/16/21 162 lb 0.6 oz (  73.5 kg)  10/08/20 160 lb 1.9 oz (72.6 kg)  08/25/20 162 lb (73.5 kg)     Health Maintenance Due  Topic Date Due   DEXA SCAN  Never done   COVID-19 Vaccine (4 - Booster) 07/31/2020   INFLUENZA VACCINE  03/02/2021    There are no preventive care reminders to display for this patient.  Lab Results  Component Value Date   TSH 7.85 (H) 01/22/2020   Lab Results  Component Value Date   WBC 4.0 08/25/2020   HGB 12.2 08/25/2020   HCT 36.3 08/25/2020   MCV 96.5 08/25/2020   PLT 207 08/25/2020   Lab Results  Component Value Date    NA 141 08/25/2020   K 4.4 08/25/2020   CO2 29 08/25/2020   GLUCOSE 81 08/25/2020   BUN 17 08/25/2020   CREATININE 0.84 08/25/2020   BILITOT 0.4 08/25/2020   ALKPHOS 58 01/08/2019   AST 25 08/25/2020   ALT 15 08/25/2020   PROT 7.4 08/25/2020   ALBUMIN 3.4 (A) 01/08/2019   CALCIUM 8.9 08/25/2020   ANIONGAP 9 11/30/2017   Lab Results  Component Value Date   CHOL 165 08/25/2020   Lab Results  Component Value Date   HDL 63 08/25/2020   Lab Results  Component Value Date   LDLCALC 86 08/25/2020   Lab Results  Component Value Date   TRIG 75 08/25/2020   Lab Results  Component Value Date   CHOLHDL 2.6 08/25/2020   Lab Results  Component Value Date   HGBA1C 5.1 05/23/2017      Assessment & Plan:   Problem List Items Addressed This Visit       Cardiovascular and Mediastinum   Hypertension - Primary    BP Readings from Last 1 Encounters:  03/16/21 118/75  Well-controlled with Amlodipine Counseled for compliance with the medications Advised DASH diet and moderate exercise/walking, at least 150 mins/week      Relevant Medications   furosemide (LASIX) 20 MG tablet   Other Relevant Orders   CBC with Differential/Platelet   CMP14+EGFR   EKG 12-Lead (Completed)   RBBB (right bundle branch block with left anterior fascicular block)    EKG showed RBBB and possible LAFB. Asymptomatic currently Referred to Cardiology for further assessment      Relevant Medications   furosemide (LASIX) 20 MG tablet   Other Relevant Orders   Ambulatory referral to Cardiology     Endocrine   Hypothyroid    On Levothyroxine 88 mcg QD Check TSH and free T4      Relevant Orders   TSH + free T4     Musculoskeletal and Integument   Osteoporosis    Gets prolia from Endocrinology Continue calcium and Vit D supplements      Relevant Orders   CMP14+EGFR   Vitamin D (25 hydroxy)   Rheumatoid arthritis ([place])    Follows up with Rheumatologist - Dr Trudie Reed On Humira       Relevant Medications   celecoxib (CELEBREX) 100 MG capsule     Other   Leg edema   Relevant Medications   furosemide (LASIX) 20 MG tablet    Meds ordered this encounter  Medications   celecoxib (CELEBREX) 100 MG capsule    Sig: Take 1 capsule (100 mg total) by mouth daily as needed for moderate pain.    Dispense:  30 capsule    Refill:  2   furosemide (LASIX) 20 MG tablet    Sig: Take 1 tablet (  20 mg total) by mouth daily.    Dispense:  30 tablet    Refill:  3    Follow-up: Return in about 6 months (around 09/16/2021) for Annual physical.    Lindell Spar, MD

## 2021-03-16 NOTE — Assessment & Plan Note (Signed)
On Levothyroxine 88 mcg QD Check TSH and free T4 

## 2021-03-16 NOTE — Assessment & Plan Note (Signed)
Gets prolia from Endocrinology Continue calcium and Vit D supplements 

## 2021-03-16 NOTE — Assessment & Plan Note (Signed)
EKG showed RBBB and possible LAFB. Asymptomatic currently Referred to Cardiology for further assessment

## 2021-03-16 NOTE — Assessment & Plan Note (Signed)
BP Readings from Last 1 Encounters:  03/16/21 118/75   Well-controlled with Amlodipine Counseled for compliance with the medications Advised DASH diet and moderate exercise/walking, at least 150 mins/week

## 2021-03-16 NOTE — Assessment & Plan Note (Addendum)
Follows up with Rheumatologist - Dr Hawkes On Humira 

## 2021-03-17 ENCOUNTER — Other Ambulatory Visit (HOSPITAL_COMMUNITY): Payer: Self-pay

## 2021-03-17 MED FILL — Denosumab Inj Soln Prefilled Syringe 60 MG/ML: SUBCUTANEOUS | 180 days supply | Qty: 1 | Fill #0 | Status: AC

## 2021-03-18 DIAGNOSIS — E038 Other specified hypothyroidism: Secondary | ICD-10-CM | POA: Diagnosis not present

## 2021-03-18 DIAGNOSIS — M810 Age-related osteoporosis without current pathological fracture: Secondary | ICD-10-CM | POA: Diagnosis not present

## 2021-03-18 DIAGNOSIS — I10 Essential (primary) hypertension: Secondary | ICD-10-CM | POA: Diagnosis not present

## 2021-03-19 LAB — CMP14+EGFR
ALT: 13 IU/L (ref 0–32)
AST: 21 IU/L (ref 0–40)
Albumin/Globulin Ratio: 0.9 — ABNORMAL LOW (ref 1.2–2.2)
Albumin: 3.2 g/dL — ABNORMAL LOW (ref 3.7–4.7)
Alkaline Phosphatase: 68 IU/L (ref 44–121)
BUN/Creatinine Ratio: 17 (ref 12–28)
BUN: 11 mg/dL (ref 8–27)
Bilirubin Total: 0.4 mg/dL (ref 0.0–1.2)
CO2: 27 mmol/L (ref 20–29)
Calcium: 9.1 mg/dL (ref 8.7–10.3)
Chloride: 98 mmol/L (ref 96–106)
Creatinine, Ser: 0.64 mg/dL (ref 0.57–1.00)
Globulin, Total: 3.7 g/dL (ref 1.5–4.5)
Glucose: 81 mg/dL (ref 65–99)
Potassium: 4.1 mmol/L (ref 3.5–5.2)
Sodium: 137 mmol/L (ref 134–144)
Total Protein: 6.9 g/dL (ref 6.0–8.5)
eGFR: 90 mL/min/{1.73_m2} (ref >59)

## 2021-03-19 LAB — CBC WITH DIFFERENTIAL/PLATELET
Basophils Absolute: 0 10*3/uL (ref 0.0–0.2)
Basos: 0 %
EOS (ABSOLUTE): 0.1 10*3/uL (ref 0.0–0.4)
Eos: 2 %
Hematocrit: 35.4 % (ref 34.0–46.6)
Hemoglobin: 11.4 g/dL (ref 11.1–15.9)
Immature Grans (Abs): 0 10*3/uL (ref 0.0–0.1)
Immature Granulocytes: 0 %
Lymphocytes Absolute: 2.4 10*3/uL (ref 0.7–3.1)
Lymphs: 60 %
MCH: 31.2 pg (ref 26.6–33.0)
MCHC: 32.2 g/dL (ref 31.5–35.7)
MCV: 97 fL (ref 79–97)
Monocytes Absolute: 0.4 10*3/uL (ref 0.1–0.9)
Monocytes: 11 %
Neutrophils Absolute: 1.1 10*3/uL — ABNORMAL LOW (ref 1.4–7.0)
Neutrophils: 27 %
Platelets: 186 10*3/uL (ref 150–450)
RBC: 3.65 x10E6/uL — ABNORMAL LOW (ref 3.77–5.28)
RDW: 12.8 % (ref 11.7–15.4)
WBC: 4 10*3/uL (ref 3.4–10.8)

## 2021-03-19 LAB — TSH+FREE T4
Free T4: 1.4 ng/dL (ref 0.82–1.77)
TSH: 0.32 u[IU]/mL — ABNORMAL LOW (ref 0.450–4.500)

## 2021-03-19 LAB — VITAMIN D 25 HYDROXY (VIT D DEFICIENCY, FRACTURES): Vit D, 25-Hydroxy: 66.6 ng/mL (ref 30.0–100.0)

## 2021-03-31 DIAGNOSIS — R5383 Other fatigue: Secondary | ICD-10-CM | POA: Diagnosis not present

## 2021-03-31 DIAGNOSIS — Z79899 Other long term (current) drug therapy: Secondary | ICD-10-CM | POA: Diagnosis not present

## 2021-03-31 DIAGNOSIS — M150 Primary generalized (osteo)arthritis: Secondary | ICD-10-CM | POA: Diagnosis not present

## 2021-03-31 DIAGNOSIS — M0609 Rheumatoid arthritis without rheumatoid factor, multiple sites: Secondary | ICD-10-CM | POA: Diagnosis not present

## 2021-03-31 DIAGNOSIS — M2550 Pain in unspecified joint: Secondary | ICD-10-CM | POA: Diagnosis not present

## 2021-04-08 DIAGNOSIS — M069 Rheumatoid arthritis, unspecified: Secondary | ICD-10-CM | POA: Diagnosis not present

## 2021-04-11 IMAGING — MG MM DIGITAL DIAGNOSTIC UNILAT*L* W/ TOMO W/ CAD
6 series · 6 of 18 positions shown · non-contrast
Comparison: Previous exam(s).

CLINICAL DATA: The patient was called back for a left breast
asymmetry.

EXAM:
DIGITAL DIAGNOSTIC UNILATERAL LEFT MAMMOGRAM WITH CAD AND TOMO

[L CC synth-2D (1 of 2)]
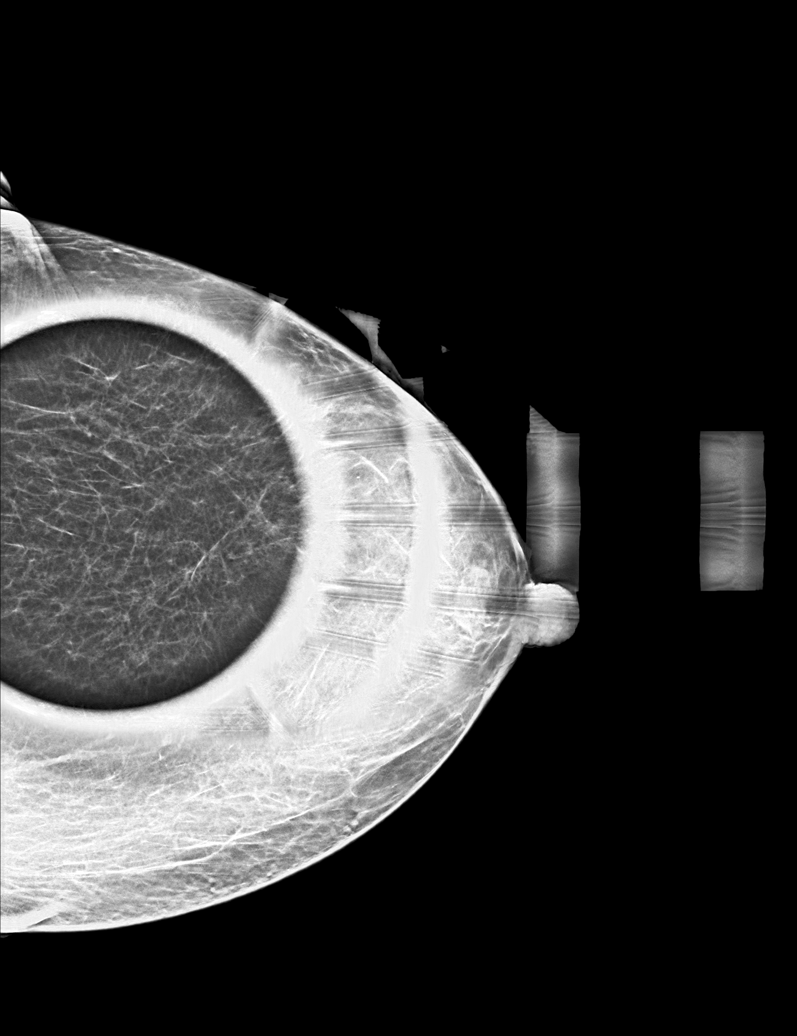

[L ML synth-2D]
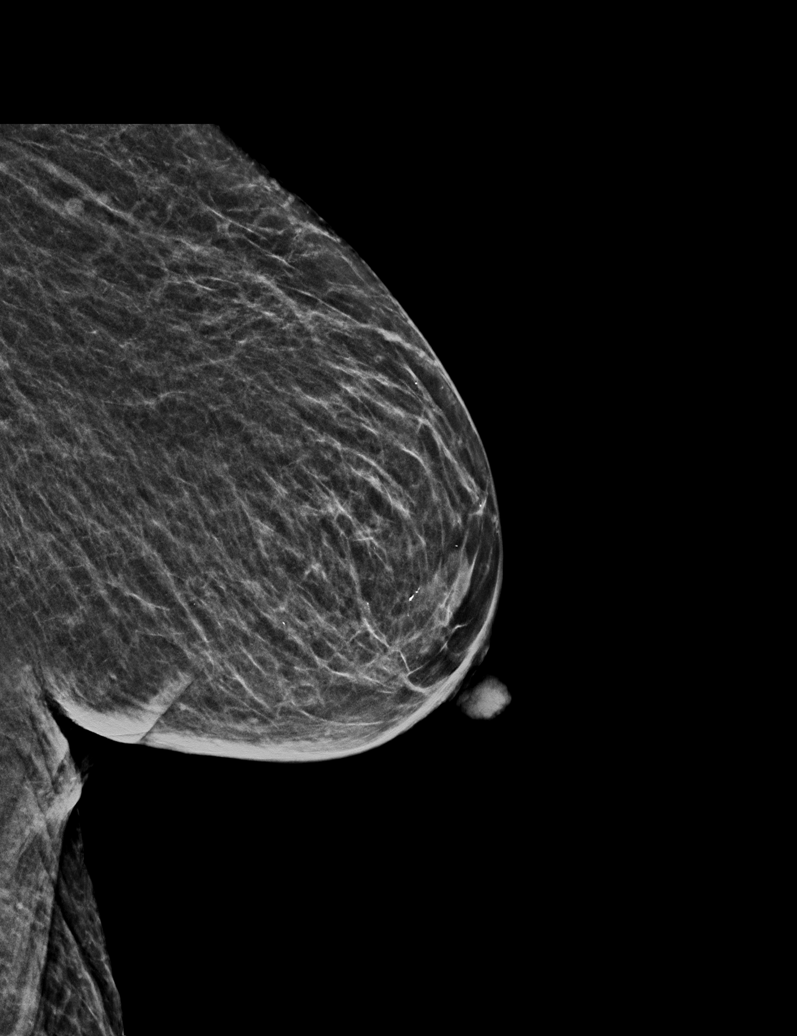

[L CC synth-2D (2 of 2)]
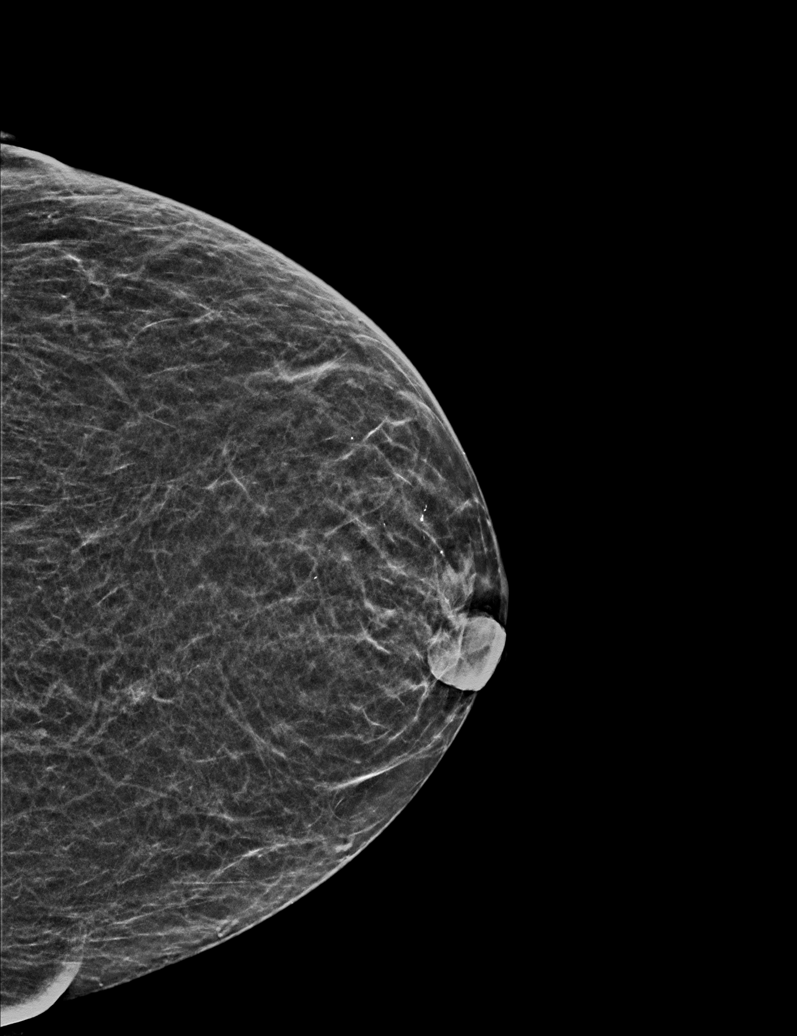

[L CC tomo (1 of 2) · tomo slice 17/33.0]
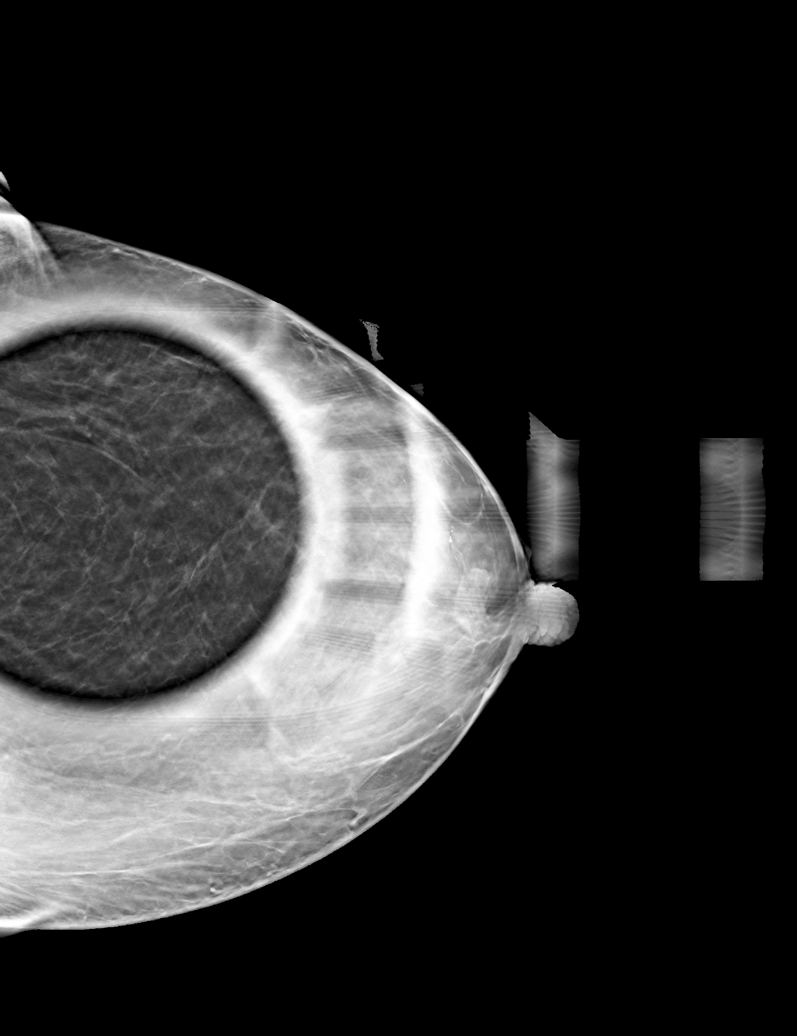

[L ML tomo · tomo slice 23/46.0]
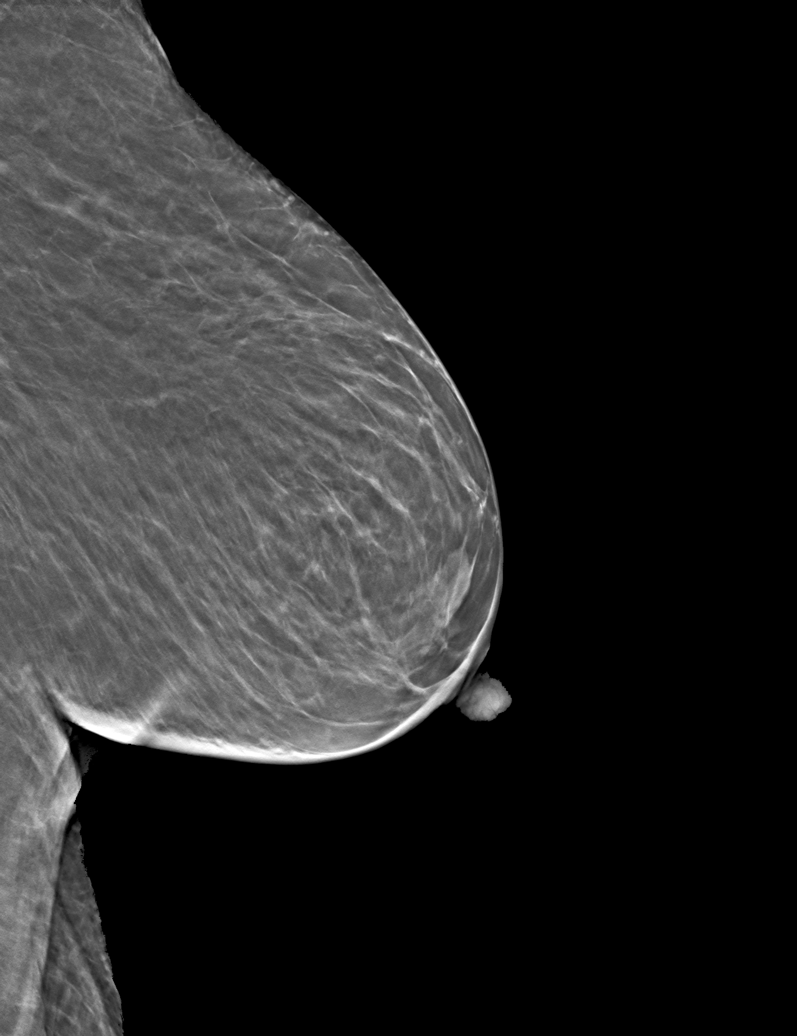

[L CC tomo (2 of 2) · tomo slice 19/37.0]
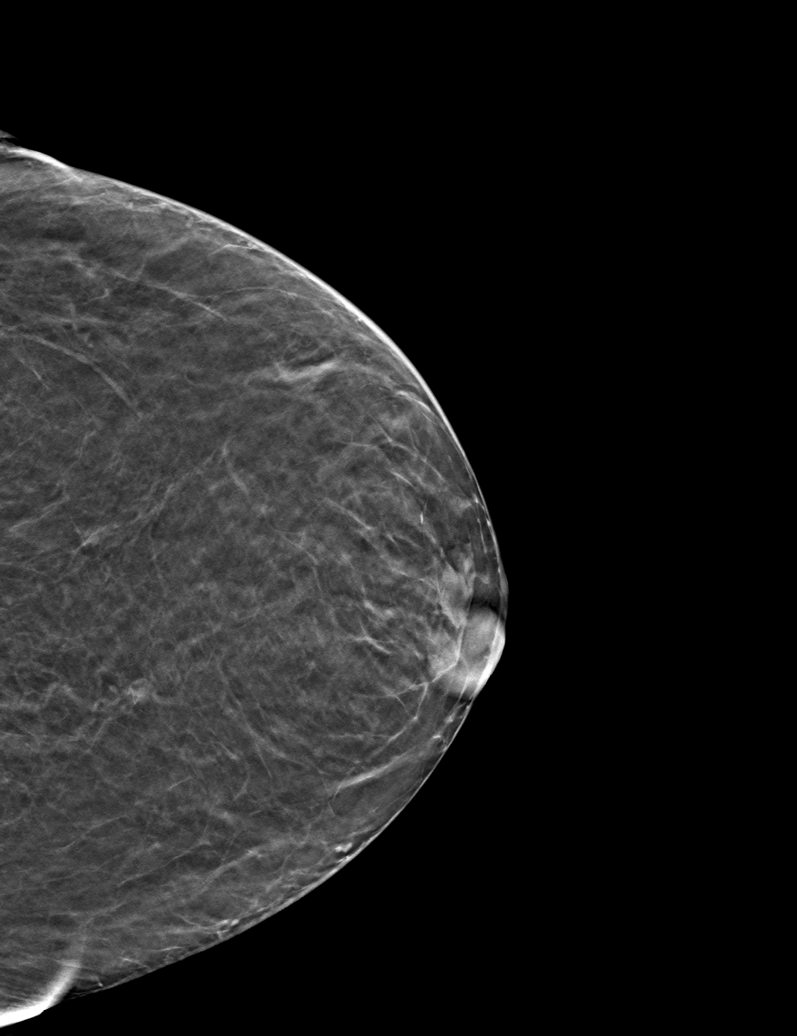

[6 of 18 positions shown; findings below may reference images not displayed]

ACR Breast Density Category b: There are scattered areas of
fibroglandular density.
FINDINGS: The left breast asymmetry resolves on today's imaging.

Mammographic images were processed with CAD.
IMPRESSION: No mammographic evidence of malignancy in the left breast.

RECOMMENDATION:
Annual screening mammography.

I have discussed the findings and recommendations with the patient.
If applicable, a reminder letter will be sent to the patient
regarding the next appointment.

BI-RADS CATEGORY  1: Negative.

## 2021-04-21 ENCOUNTER — Other Ambulatory Visit (HOSPITAL_COMMUNITY): Payer: Self-pay

## 2021-04-22 ENCOUNTER — Other Ambulatory Visit (HOSPITAL_COMMUNITY): Payer: Self-pay

## 2021-04-27 DIAGNOSIS — M810 Age-related osteoporosis without current pathological fracture: Secondary | ICD-10-CM | POA: Diagnosis not present

## 2021-06-08 ENCOUNTER — Other Ambulatory Visit (HOSPITAL_COMMUNITY): Payer: Self-pay

## 2021-06-15 ENCOUNTER — Ambulatory Visit: Payer: Medicare Other | Admitting: Internal Medicine

## 2021-06-15 DIAGNOSIS — R5383 Other fatigue: Secondary | ICD-10-CM | POA: Diagnosis not present

## 2021-06-15 DIAGNOSIS — M0609 Rheumatoid arthritis without rheumatoid factor, multiple sites: Secondary | ICD-10-CM | POA: Diagnosis not present

## 2021-06-16 ENCOUNTER — Encounter: Payer: Self-pay | Admitting: Internal Medicine

## 2021-06-16 ENCOUNTER — Other Ambulatory Visit: Payer: Self-pay

## 2021-06-16 ENCOUNTER — Ambulatory Visit (INDEPENDENT_AMBULATORY_CARE_PROVIDER_SITE_OTHER): Payer: Medicare Other | Admitting: Internal Medicine

## 2021-06-16 DIAGNOSIS — E038 Other specified hypothyroidism: Secondary | ICD-10-CM | POA: Diagnosis not present

## 2021-06-16 NOTE — Progress Notes (Signed)
Virtual Visit via Telephone Note   This visit type was conducted due to national recommendations for restrictions regarding the COVID-19 Pandemic (e.g. social distancing) in an effort to limit this patient's exposure and mitigate transmission in our community.  Due to her co-morbid illnesses, this patient is at least at moderate risk for complications without adequate follow up.  This format is felt to be most appropriate for this patient at this time.  The patient did not have access to video technology/had technical difficulties with video requiring transitioning to audio format only (telephone).  All issues noted in this document were discussed and addressed.  No physical exam could be performed with this format.   Evaluation Performed:  Follow-up visit  Date:  06/16/2021   ID:  Kathy [name], DOB 1940-11-14, MRN [id number]  Patient Location: Home Provider Location: Office/Clinic  Location of Patient: Home Location of Provider: Telehealth Consent was obtain for visit to be over via telehealth. I verified that I am speaking with the correct person using two identifiers.  PCP:  Lindell Spar, MD   Chief Complaint: Hypothyroidism  History of Present Illness:    Kathy [name] is a 80 y.o. female who has a televisit for follow up of hypothyroidism. Her last TSH was borderline low, but she denies any recent change in appetite, weight, tremors or palpitations. She has been doing well overall. She has been taking Levothyroxine regularly. Blood tests were reviewed and discussed with the patient in detail.  The patient does not have symptoms concerning for COVID-19 infection (fever, chills, cough, or new shortness of breath).   Past Medical, Surgical, Social History, Allergies, and Medications have been Reviewed.  Past Medical History:  Diagnosis Date   Abnormal findings on diagnostic imaging of heart and coronary circulation    Anemia    FROM BLEEDING ULCER   Anemia     Anxiety    takes Alprazolam daily as needed   Anxiety disorder, unspecified    Arthritis    dx with RA 2017   Bariatric surgery status    Cataract    Diverticulosis    Essential (primary) hypertension    Gastro-esophageal reflux disease with esophagitis    GERD (gastroesophageal reflux disease)    Headache(784.0)    occasionally   History of blood transfusion    no abnormal reaction noted   History of bronchitis    last time many yrs ago   Hyperlipidemia    PT DENIES THIS DX -  ON NO MEDS AND NO ONE HAS TOLD HER   Hypertension    takes Amlodipine daily   Hypothyroidism    takes Synthroid daily   Hypothyroidism, unspecified    Insomnia    Joint pain    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   Localized edema    Nocturia    Numbness    occasionally left arm at night   Obesity    Osteoporosis    takes Fosamax weekly   Pain in joint involving pelvic region and thigh    Peripheral edema    takes Lasix daily as needed   PONV (postoperative nausea and vomiting)    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/2017   Rheumatoid arthritis, unspecified ([place])    Rotator cuff arthropathy, 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   Sleep apnea    Phreesia 08/22/2020   Stomach ulcer    Thyroid disease    Phreesia 08/22/2020   Unspecified injury of muscle(s) and tendon(s) of the  rotator cuff of left shoulder, subsequent encounter    Unspecified osteoarthritis, unspecified site    Wears glasses    Wears partial dentures    Past Surgical History:  Procedure Laterality Date   ABDOMINAL HYSTERECTOMY     partial   cataract surgery Bilateral    CHOLECYSTECTOMY     COLONOSCOPY     EYE SURGERY     CATARACTS BOTH   gastric bypass surgery     JOINT REPLACEMENT Bilateral    hip   REVERSE SHOULDER ARTHROPLASTY Right 05/11/2016   Procedure: REVERSE SHOULDER ARTHROPLASTY;  Surgeon: Marchia Bond, MD;  Location: [place];  Service: Orthopedics;  Laterality: Right;   REVISION TOTAL HIP ARTHROPLASTY  Left 10/03/2013   DR Mayer Camel   ROTATOR CUFF REPAIR     TOTAL HIP REVISION Left 10/03/2013   Procedure: TOTAL HIP REVISION- left;  Surgeon: Kerin Salen, MD;  Location: [place];  Service: Orthopedics;  Laterality: Left;   TOTAL KNEE ARTHROPLASTY Left 11/29/2017   Procedure: LEFT TOTAL KNEE ARTHROPLASTY;  Surgeon: Marchia Bond, MD;  Location: [place];  Service: Orthopedics;  Laterality: Left;   TOTAL SHOULDER ARTHROPLASTY Left 11/09/2016   Procedure: TOTAL REVERSE SHOULDER ARTHROPLASTY;  Surgeon: Marchia Bond, MD;  Location: [place];  Service: Orthopedics;  Laterality: Left;   TOTAL SHOULDER REPLACEMENT Left 10/2016   TOTAL THYROIDECTOMY       Current Meds  Medication Sig   Adalimumab (HUMIRA) 40 MG/0.4ML PSKT Injection every 2 weeks   ALPRAZolam (XANAX) 0.25 MG tablet Take 0.25 mg by mouth at bedtime as needed for anxiety or sleep.   amLODipine (NORVASC) 5 MG tablet TAKE (1) TABLET BY MOUTH ONCE DAILY.   calcium carbonate (OSCAL) 1500 (600 Ca) MG TABS tablet Take 300-600 mg by mouth 3 (three) times daily. Take 1 tablet (600 mg) in the morning, 0.5 tablet (300 mg) at noon, & 1 tablet (600 mg) in the evening.   celecoxib (CELEBREX) 100 MG capsule Take 1 capsule (100 mg total) by mouth daily as needed for moderate pain.   Cholecalciferol 1000 UNITS TBDP Take 1,000 Units by mouth 2 (two) times daily.    denosumab (PROLIA) 60 MG/ML SOSY injection INJECT AS DIRECTED EVERY 6 MONTHS 180   diclofenac Sodium (VOLTAREN) 1 % GEL APPLY 2 GRAMS TO AFFECTED AREA FOUR TIMES A DAY.   furosemide (LASIX) 20 MG tablet Take 1 tablet (20 mg total) by mouth daily.   levothyroxine (SYNTHROID) 88 MCG tablet Take 1 tablet (88 mcg total) by mouth daily.   Multiple Vitamin (MULTIVITAMIN WITH MINERALS) TABS tablet Take 1 tablet by mouth daily.    Omega-3 Fatty Acids (FISH OIL) 1000 MG CAPS Take 1,000 mg by mouth daily.   potassium chloride SA (KLOR-CON) 20 MEQ tablet TAKE 1 TABLET (20 MEQ) BY MOUTH DAILY PRN- EDEMA   traMADol  (ULTRAM) 50 MG tablet Take 50 mg by mouth every 6 (six) hours as needed for moderate pain.      Allergies:   Aspirin and Macrobid [nitrofurantoin]   ROS:   Please see the history of present illness.     All other systems reviewed and are negative.   Labs/Other Tests and Data Reviewed:    Recent Labs: 03/18/2021: ALT 13; BUN 11; Creatinine, Ser 0.64; Hemoglobin 11.4; Platelets 186; Potassium 4.1; Sodium 137; TSH 0.320   Recent Lipid Panel Lab Results  Component Value Date/Time   CHOL 165 08/25/2020 11:18 AM   TRIG 75 08/25/2020 11:18 AM   HDL 63  08/25/2020 11:18 AM   CHOLHDL 2.6 08/25/2020 11:18 AM   LDLCALC 86 08/25/2020 11:18 AM    Wt Readings from Last 3 Encounters:  03/16/21 162 lb 0.6 oz (73.5 kg)  10/08/20 160 lb 1.9 oz (72.6 kg)  08/25/20 162 lb (73.5 kg)      ASSESSMENT & PLAN:    Hypothyroid Lab Results  Component Value Date   TSH 0.320 (L) 03/18/2021   TSH borderline low, no symptoms of overcorrection currently On Levothyroxine 88 mcg QD Check TSH and free T4 in the next visit   Time:   Today, I have spent 9 minutes reviewing the chart, including problem list, medications, and with the patient with telehealth technology discussing the above problems.   Medication Adjustments/Labs and Tests Ordered: Current medicines are reviewed at length with the patient today.  Concerns regarding medicines are outlined above.   Tests Ordered: No orders of the defined types were placed in this encounter.   Medication Changes: No orders of the defined types were placed in this encounter.    Note: This dictation was prepared with Dragon dictation along with smaller phrase technology. Similar sounding words can be transcribed inadequately or may not be corrected upon review. Any transcriptional errors that result from this process are unintentional.      Disposition:  Follow up  Signed, Lindell Spar, MD  06/16/2021 3:41 PM     [facility]

## 2021-06-16 NOTE — Assessment & Plan Note (Signed)
Lab Results  Component Value Date   TSH 0.320 (L) 03/18/2021   TSH borderline low, no symptoms of overcorrection currently On Levothyroxine 88 mcg QD Check TSH and free T4 in the next visit 

## 2021-06-17 ENCOUNTER — Ambulatory Visit: Payer: Medicare Other | Admitting: Internal Medicine

## 2021-06-17 ENCOUNTER — Ambulatory Visit (INDEPENDENT_AMBULATORY_CARE_PROVIDER_SITE_OTHER): Payer: Medicare Other | Admitting: Cardiology

## 2021-06-17 ENCOUNTER — Encounter: Payer: Self-pay | Admitting: Cardiology

## 2021-06-17 VITALS — BP 130/80 | HR 64 | Ht 61.5 in | Wt 171.2 lb

## 2021-06-17 DIAGNOSIS — R600 Localized edema: Secondary | ICD-10-CM

## 2021-06-17 DIAGNOSIS — R9431 Abnormal electrocardiogram [ECG] [EKG]: Secondary | ICD-10-CM | POA: Diagnosis not present

## 2021-06-17 DIAGNOSIS — I10 Essential (primary) hypertension: Secondary | ICD-10-CM

## 2021-06-17 NOTE — Progress Notes (Signed)
Clinical Summary Kathy [name] is a 80 y.o.female seen today for follow up of the following medical problems.          1. Leg edema - echo 11/2016 LVEF 55-60%, no WMAs, grade I diastolic dysfunction, PASP 20, mild RV dilatation    - taking lasix 20mg  daily, overall controls her swelling     2. 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 On my read I do not agree with left axis or bifacsicular block on computer read. RBBB there at least since 2015   4. HTN - she is compliant with meds   Past Medical History:  Diagnosis Date   Abnormal findings on diagnostic imaging of heart and coronary circulation    Anemia    FROM BLEEDING ULCER   Anemia    Anxiety    takes Alprazolam daily as needed   Anxiety disorder, unspecified    Arthritis    dx with RA 2017   Bariatric surgery status    Cataract    Diverticulosis    Essential (primary) hypertension    Gastro-esophageal reflux disease with esophagitis    GERD (gastroesophageal reflux disease)    Headache(784.0)    occasionally   History of blood transfusion    no abnormal reaction noted   History of bronchitis    last time many yrs ago   Hyperlipidemia    PT DENIES THIS DX -  ON NO MEDS AND NO ONE HAS TOLD HER   Hypertension    takes Amlodipine daily   Hypothyroidism    takes Synthroid daily   Hypothyroidism, unspecified    Insomnia    Joint pain    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   Localized edema    Nocturia    Numbness    occasionally left arm at night   Obesity    Osteoporosis    takes Fosamax weekly   Pain in joint involving pelvic region and thigh    Peripheral edema    takes Lasix daily as needed   PONV (postoperative nausea and vomiting)    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/2017   Rheumatoid arthritis, unspecified ([facility])    Rotator cuff arthropathy, 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   Sleep apnea    Phreesia  08/22/2020   Stomach ulcer    Thyroid disease    Phreesia 08/22/2020   Unspecified injury of muscle(s) and tendon(s) of the rotator cuff of left shoulder, subsequent encounter    Unspecified osteoarthritis, unspecified site    Wears glasses    Wears partial dentures      Allergies  Allergen Reactions   Aspirin Nausea And Vomiting and Other (See Comments)    Adult Aspirin: upset stomach / irritates ulcer    Macrobid [Nitrofurantoin]      Current Outpatient Medications  Medication Sig Dispense Refill   Adalimumab (HUMIRA) 40 MG/0.4ML PSKT Injection every 2 weeks 2 each    ALPRAZolam (XANAX) 0.25 MG tablet Take 0.25 mg by mouth at bedtime as needed for anxiety or sleep.     amLODipine (NORVASC) 5 MG tablet TAKE (1) TABLET BY MOUTH ONCE DAILY. 90 tablet 3   calcium carbonate (OSCAL) 1500 (600 Ca) MG TABS tablet Take 300-600 mg by mouth 3 (three) times daily. Take 1 tablet (600 mg) in the morning, 0.5 tablet (300 mg) at noon, & 1 tablet (600 mg) in the evening.  celecoxib (CELEBREX) 100 MG capsule Take 1 capsule (100 mg total) by mouth daily as needed for moderate pain. 30 capsule 2   Cholecalciferol 1000 UNITS TBDP Take 1,000 Units by mouth 2 (two) times daily.      denosumab (PROLIA) 60 MG/ML SOSY injection INJECT AS DIRECTED EVERY 6 MONTHS 180 1 mL 1   diclofenac Sodium (VOLTAREN) 1 % GEL APPLY 2 GRAMS TO AFFECTED AREA FOUR TIMES A DAY. 1050 g 3   furosemide (LASIX) 20 MG tablet Take 1 tablet (20 mg total) by mouth daily. 30 tablet 3   levothyroxine (SYNTHROID) 88 MCG tablet Take 1 tablet (88 mcg total) by mouth daily. 90 tablet 1   Multiple Vitamin (MULTIVITAMIN WITH MINERALS) TABS tablet Take 1 tablet by mouth daily.      Omega-3 Fatty Acids (FISH OIL) 1000 MG CAPS Take 1,000 mg by mouth daily.     potassium chloride SA (KLOR-CON) 20 MEQ tablet TAKE 1 TABLET (20 MEQ) BY MOUTH DAILY PRN- EDEMA 90 tablet 3   traMADol (ULTRAM) 50 MG tablet Take 50 mg by mouth every 6 (six) hours as  needed for moderate pain.      No current facility-administered medications for this visit.     Past Surgical History:  Procedure Laterality Date   ABDOMINAL HYSTERECTOMY     partial   cataract surgery Bilateral    CHOLECYSTECTOMY     COLONOSCOPY     EYE SURGERY     CATARACTS BOTH   gastric bypass surgery     JOINT REPLACEMENT Bilateral    hip   REVERSE SHOULDER ARTHROPLASTY Right 05/11/2016   Procedure: REVERSE SHOULDER ARTHROPLASTY;  Surgeon: Marchia Bond, MD;  Location: [place];  Service: Orthopedics;  Laterality: Right;   REVISION TOTAL HIP ARTHROPLASTY Left 10/03/2013   DR Mayer Camel   ROTATOR CUFF REPAIR     TOTAL HIP REVISION Left 10/03/2013   Procedure: TOTAL HIP REVISION- left;  Surgeon: Kerin Salen, MD;  Location: [place];  Service: Orthopedics;  Laterality: Left;   TOTAL KNEE ARTHROPLASTY Left 11/29/2017   Procedure: LEFT TOTAL KNEE ARTHROPLASTY;  Surgeon: Marchia Bond, MD;  Location: [place];  Service: Orthopedics;  Laterality: Left;   TOTAL SHOULDER ARTHROPLASTY Left 11/09/2016   Procedure: TOTAL REVERSE SHOULDER ARTHROPLASTY;  Surgeon: Marchia Bond, MD;  Location: [place];  Service: Orthopedics;  Laterality: Left;   TOTAL SHOULDER REPLACEMENT Left 10/2016   TOTAL THYROIDECTOMY       Allergies  Allergen Reactions   Aspirin Nausea And Vomiting and Other (See Comments)    Adult Aspirin: upset stomach / irritates ulcer    Macrobid [Nitrofurantoin]       Family History  Problem Relation Age of Onset   Diabetes Mother    Congestive Heart Failure Mother    Lung cancer Father    Thrombosis Sister    CAD Brother        CABG     Social History Kathy [name] reports that she has never smoked. She has never used smokeless tobacco. Kathy [name] reports no history of alcohol use.   Review of Systems CONSTITUTIONAL: No weight loss, fever, chills, weakness or fatigue.  HEENT: Eyes: No visual loss, blurred vision, double vision or yellow sclerae.No hearing loss, sneezing,  congestion, runny nose or sore throat.  SKIN: No rash or itching.  CARDIOVASCULAR: per hpi RESPIRATORY: No shortness of breath, cough or sputum.  GASTROINTESTINAL: No anorexia, nausea, vomiting or diarrhea. No abdominal pain or blood.  GENITOURINARY: No  burning on urination, no polyuria NEUROLOGICAL: No headache, dizziness, syncope, paralysis, ataxia, numbness or tingling in the extremities. No change in bowel or bladder control.  MUSCULOSKELETAL: No muscle, back pain, joint pain or stiffness.  LYMPHATICS: No enlarged nodes. No history of splenectomy.  PSYCHIATRIC: No history of depression or anxiety.  ENDOCRINOLOGIC: No reports of sweating, cold or heat intolerance. No polyuria or polydipsia.  Marland Kitchen   Physical Examination Today's Vitals   06/17/21 1321  BP: 130/80  Pulse: 64  SpO2: 94%  Weight: 171 lb 3.2 oz (77.7 kg)  Height: 5' 1.5" (1.562 m)   Body mass index is 31.82 kg/m.  Gen: resting comfortably, no acute distress HEENT: no scleral icterus, pupils equal round and reactive, no palptable cervical adenopathy,  CV: RRR, no m/r/g no jvd Resp: Clear to auscultation bilaterally GI: abdomen is soft, non-tender, non-distended, normal bowel sounds, no hepatosplenomegaly MSK: extremities are warm, no edema.  Skin: warm, no rash Neuro:  no focal deficits Psych: appropriate affect   Diagnostic Studies  11/2016 echo Study Conclusions   - Left ventricle: The cavity size was normal. Wall thickness was   normal. Systolic function was normal. The estimated ejection   fraction was in the range of 55% to 60%. Wall motion was normal;   there were no regional wall motion abnormalities. Doppler   parameters are consistent with abnormal left ventricular   relaxation (grade 1 diastolic dysfunction). - Aortic valve: Mildly calcified annulus. Trileaflet. - Mitral valve: There was trivial regurgitation. - Right ventricle: The cavity size was mildly dilated. - Right atrium: The atrium was at  the upper limits of normal in   size. - Atrial septum: No defect or patent foramen ovale was identified. - Tricuspid valve: There was trivial regurgitation. - Pulmonary arteries: PA peak pressure: 20 mm Hg (S). - Pericardium, extracardiac: A small pericardial effusion was   identified circumferential to the heart.   Impressions:   - Normal LV wall thickness with LVEF 55-60% and grade 1 diastolic   dysfunction. Trivial mitral regurgitation. Mildly calcified   aortic annulus. Mild dilated right ventricle and upper normal   right atrial chamber size. Trivial tricuspid regurgitation with   PASP estimated 20 mmHg. Small circumferential pericardial   effusion.     Assessment and Plan   1. LE edema - echo with mild LV diastolic dysfunction, may be playing some role. Likely some component of venous insufficiency as well -overall doing well on low dose lasix, will continue   2. 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 HTN - at goal, continue current meds     Arnoldo Lenis, M.D.

## 2021-06-17 NOTE — Patient Instructions (Signed)
Medication Instructions:  Continue all current medications.  Labwork: none  Testing/Procedures: none  Follow-Up: As needed.    Any Other Special Instructions Will Be Listed Below (If Applicable).  If you need a refill on your cardiac medications before your next appointment, please call your pharmacy.  

## 2021-07-06 ENCOUNTER — Other Ambulatory Visit: Payer: Self-pay | Admitting: Internal Medicine

## 2021-07-06 DIAGNOSIS — R600 Localized edema: Secondary | ICD-10-CM

## 2021-07-06 DIAGNOSIS — M069 Rheumatoid arthritis, unspecified: Secondary | ICD-10-CM

## 2021-07-06 DIAGNOSIS — M25569 Pain in unspecified knee: Secondary | ICD-10-CM | POA: Diagnosis not present

## 2021-07-06 DIAGNOSIS — M79643 Pain in unspecified hand: Secondary | ICD-10-CM | POA: Diagnosis not present

## 2021-07-06 DIAGNOSIS — G4700 Insomnia, unspecified: Secondary | ICD-10-CM | POA: Diagnosis not present

## 2021-07-06 DIAGNOSIS — G5603 Carpal tunnel syndrome, bilateral upper limbs: Secondary | ICD-10-CM | POA: Diagnosis not present

## 2021-07-06 DIAGNOSIS — G5600 Carpal tunnel syndrome, unspecified upper limb: Secondary | ICD-10-CM | POA: Diagnosis not present

## 2021-08-07 ENCOUNTER — Other Ambulatory Visit: Payer: Self-pay | Admitting: Internal Medicine

## 2021-08-07 DIAGNOSIS — M069 Rheumatoid arthritis, unspecified: Secondary | ICD-10-CM

## 2021-08-20 DIAGNOSIS — M810 Age-related osteoporosis without current pathological fracture: Secondary | ICD-10-CM | POA: Diagnosis not present

## 2021-08-20 DIAGNOSIS — E890 Postprocedural hypothyroidism: Secondary | ICD-10-CM | POA: Diagnosis not present

## 2021-08-20 DIAGNOSIS — E876 Hypokalemia: Secondary | ICD-10-CM | POA: Diagnosis not present

## 2021-08-20 DIAGNOSIS — E892 Postprocedural hypoparathyroidism: Secondary | ICD-10-CM | POA: Diagnosis not present

## 2021-08-20 LAB — HM DEXA SCAN: HM Dexa Scan: NORMAL

## 2021-08-27 DIAGNOSIS — E892 Postprocedural hypoparathyroidism: Secondary | ICD-10-CM | POA: Diagnosis not present

## 2021-08-27 DIAGNOSIS — E890 Postprocedural hypothyroidism: Secondary | ICD-10-CM | POA: Diagnosis not present

## 2021-08-27 DIAGNOSIS — C73 Malignant neoplasm of thyroid gland: Secondary | ICD-10-CM | POA: Diagnosis not present

## 2021-08-27 DIAGNOSIS — M810 Age-related osteoporosis without current pathological fracture: Secondary | ICD-10-CM | POA: Diagnosis not present

## 2021-09-02 ENCOUNTER — Other Ambulatory Visit (HOSPITAL_COMMUNITY): Payer: Self-pay

## 2021-09-10 ENCOUNTER — Other Ambulatory Visit: Payer: Self-pay

## 2021-09-10 ENCOUNTER — Other Ambulatory Visit (HOSPITAL_COMMUNITY): Payer: Self-pay

## 2021-09-14 ENCOUNTER — Ambulatory Visit (INDEPENDENT_AMBULATORY_CARE_PROVIDER_SITE_OTHER): Payer: Medicare Other | Admitting: *Deleted

## 2021-09-14 ENCOUNTER — Other Ambulatory Visit: Payer: Self-pay

## 2021-09-14 DIAGNOSIS — Z0000 Encounter for general adult medical examination without abnormal findings: Secondary | ICD-10-CM

## 2021-09-14 NOTE — Progress Notes (Signed)
Subjective:   Kathy [name] is a 81 y.o. female who presents for Medicare Annual (Subsequent) preventive examination.  I connected with  Kathy [name] on 09/14/21 by a audio enabled telemedicine application and verified that I am speaking with the correct person using two identifiers.  Patient Location: Home  Provider Location: Office/Clinic  I discussed the limitations of evaluation and management by telemedicine. The patient expressed understanding and agreed to proceed.   Review of Systems     Kathy [name] , Thank you for taking time to come for your Medicare Wellness Visit. I appreciate your ongoing commitment to your health goals. Please review the following plan we discussed and let me know if I can assist you in the future.   These are the goals we discussed:  Goals   None     This is a list of the screening recommended for you and due dates:  Health Maintenance  Topic Date Due   DEXA scan (bone density measurement)  Never done   COVID-19 Vaccine (4 - Booster) 06/26/2020   Tetanus Vaccine  01/25/2024   Pneumonia Vaccine  Completed   Flu Shot  Completed   Hepatitis C Screening: USPSTF Recommendation to screen - Ages 18-79 yo.  Completed   Zoster (Shingles) Vaccine  Completed   HPV Vaccine  Aged Out          Objective:    There were no vitals filed for this visit. There is no height or weight on file to calculate BMI.  Advanced Directives 08/25/2020 11/29/2017 11/18/2017 06/15/2017 06/09/2017 11/23/2016 11/17/2016  Does Patient Have a Medical Advance Directive? No No No No No Yes Yes  Type of Advance Directive - - - - - Out of facility DNR (pink MOST or yellow form) (No Data)  Does patient want to make changes to medical advance directive? - - - - - - No - Patient declined  Would patient like information on creating a medical advance directive? No - Patient declined No - Patient declined Yes (MAU/Ambulatory/Procedural Areas - Information given) No - Patient  declined No - Patient declined No - Patient declined No - Patient declined  Pre-existing out of facility DNR order (yellow form or pink MOST form) - - - - - Pink MOST form placed in chart (order not valid for inpatient use) -    Current Medications (verified) Outpatient Encounter Medications as of 09/14/2021  Medication Sig   Adalimumab (HUMIRA) 40 MG/0.4ML PSKT Injection every 2 weeks   ALPRAZolam (XANAX) 0.25 MG tablet Take 0.25 mg by mouth at bedtime as needed for anxiety or sleep.   amLODipine (NORVASC) 5 MG tablet TAKE (1) TABLET BY MOUTH ONCE DAILY.   calcium carbonate (OSCAL) 1500 (600 Ca) MG TABS tablet Take 300-600 mg by mouth 3 (three) times daily. Take 1 tablet (600 mg) in the morning, 0.5 tablet (300 mg) at noon, & 1 tablet (600 mg) in the evening.   celecoxib (CELEBREX) 100 MG capsule TAKE ONE CAPSULE BY MOUTH ONCE DAILY.   Cholecalciferol 1000 UNITS TBDP Take 1,000 Units by mouth 2 (two) times daily.    denosumab (PROLIA) 60 MG/ML SOSY injection INJECT AS DIRECTED EVERY 6 MONTHS 180   diclofenac Sodium (VOLTAREN) 1 % GEL APPLY 2 GRAMS TO AFFECTED AREA FOUR TIMES A DAY.   furosemide (LASIX) 20 MG tablet TAKE ONE TABLET BY MOUTH ONCE DAILY.   levothyroxine (SYNTHROID) 88 MCG tablet Take 1 tablet (88 mcg total) by mouth daily.   Multiple  Vitamin (MULTIVITAMIN WITH MINERALS) TABS tablet Take 1 tablet by mouth daily.    Omega-3 Fatty Acids (FISH OIL) 1000 MG CAPS Take 1,000 mg by mouth daily.   potassium chloride SA (KLOR-CON) 20 MEQ tablet TAKE 1 TABLET (20 MEQ) BY MOUTH DAILY PRN- EDEMA   traMADol (ULTRAM) 50 MG tablet Take 50 mg by mouth every 6 (six) hours as needed for moderate pain.    No facility-administered encounter medications on file as of 09/14/2021.    Allergies (verified) Aspirin and Macrobid [nitrofurantoin]   History: Past Medical History:  Diagnosis Date   Abnormal findings on diagnostic imaging of heart and coronary circulation    Anemia    FROM BLEEDING  ULCER   Anemia    Anxiety    takes Alprazolam daily as needed   Anxiety disorder, unspecified    Arthritis    dx with RA 2017   Bariatric surgery status    Cataract    Diverticulosis    Essential (primary) hypertension    Gastro-esophageal reflux disease with esophagitis    GERD (gastroesophageal reflux disease)    Headache(784.0)    occasionally   History of blood transfusion    no abnormal reaction noted   History of bronchitis    last time many yrs ago   Hyperlipidemia    PT DENIES THIS DX -  ON NO MEDS AND NO ONE HAS TOLD HER   Hypertension    takes Amlodipine daily   Hypothyroidism    takes Synthroid daily   Hypothyroidism, unspecified    Insomnia    Joint pain    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   Localized edema    Nocturia    Numbness    occasionally left arm at night   Obesity    Osteoporosis    takes Fosamax weekly   Pain in joint involving pelvic region and thigh    Peripheral edema    takes Lasix daily as needed   PONV (postoperative nausea and vomiting)    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/2017   Rheumatoid arthritis, unspecified (HCC)    Rotator cuff arthropathy, 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   Sleep apnea    Phreesia 08/22/2020   Stomach ulcer    Thyroid disease    Phreesia 08/22/2020   Unspecified injury of muscle(s) and tendon(s) of the rotator cuff of left shoulder, subsequent encounter    Unspecified osteoarthritis, unspecified site    Wears glasses    Wears partial dentures    Past Surgical History:  Procedure Laterality Date   ABDOMINAL HYSTERECTOMY     partial   cataract surgery Bilateral    CHOLECYSTECTOMY     COLONOSCOPY     EYE SURGERY     CATARACTS BOTH   gastric bypass surgery     JOINT REPLACEMENT Bilateral    hip   REVERSE SHOULDER ARTHROPLASTY Right 05/11/2016   Procedure: REVERSE SHOULDER ARTHROPLASTY;  Surgeon: Marchia Bond, MD;  Location: [place];  Service: Orthopedics;  Laterality: Right;   REVISION  TOTAL HIP ARTHROPLASTY Left 10/03/2013   DR Mayer Camel   ROTATOR CUFF REPAIR     TOTAL HIP REVISION Left 10/03/2013   Procedure: TOTAL HIP REVISION- left;  Surgeon: Kerin Salen, MD;  Location: [place];  Service: Orthopedics;  Laterality: Left;   TOTAL KNEE ARTHROPLASTY Left 11/29/2017   Procedure: LEFT TOTAL KNEE ARTHROPLASTY;  Surgeon: Marchia Bond, MD;  Location: [place];  Service: Orthopedics;  Laterality: Left;   TOTAL SHOULDER ARTHROPLASTY  Left 11/09/2016   Procedure: TOTAL REVERSE SHOULDER ARTHROPLASTY;  Surgeon: Marchia Bond, MD;  Location: [place];  Service: Orthopedics;  Laterality: Left;   TOTAL SHOULDER REPLACEMENT Left 10/2016   TOTAL THYROIDECTOMY     Family History  Problem Relation Age of Onset   Diabetes Mother    Congestive Heart Failure Mother    Lung cancer Father    Thrombosis Sister    CAD Brother        CABG   Social History   Socioeconomic History   Marital status: Divorced    Spouse name: Not on file   Number of children: Not on file   Years of education: Not on file   Highest education level: Not on file  Occupational History   Occupation: disabled  Tobacco Use   Smoking status: Never   Smokeless tobacco: Never  Vaping Use   Vaping Use: Never used  Substance and Sexual Activity   Alcohol use: No   Drug use: No   Sexual activity: Not Currently    Birth control/protection: Surgical  Other Topics Concern   Not on file  Social History Narrative   Not on file   Social Determinants of Health   Financial Resource Strain: Not on file  Food Insecurity: Not on file  Transportation Needs: Not on file  Physical Activity: Not on file  Stress: Not on file  Social Connections: Not on file    Tobacco Counseling Counseling given: Not Answered   Clinical Intake:                 Diabetic?no         Activities of Daily Living No flowsheet data found.  Patient Care Team: Lindell Spar, MD as PCP - General (Internal Medicine) Harl Bowie  Alphonse Guild, MD as PCP - Cardiology (Cardiology)  Indicate any recent Medical Services you may have received from other than [facility] providers in the past year (date may be approximate).     Assessment:   This is a routine wellness examination for [place].  Hearing/Vision screen No results found.  Dietary issues and exercise activities discussed:     Goals Addressed   None   Depression Screen PHQ 2/9 Scores 06/16/2021 03/16/2021 10/08/2020 08/25/2020 11/22/2019  PHQ - 2 Score 0 0 0 0 0  PHQ- 9 Score - - - 0 0    Fall Risk Fall Risk  06/16/2021 03/16/2021 10/08/2020 08/25/2020 11/22/2019  Falls in the past year? 0 0 0 0 0  Number falls in past yr: 0 0 0 - 0  Injury with Fall? 0 0 0 - 0  Risk for fall due to : No Fall Risks No Fall Risks No Fall Risks No Fall Risks Other (Comment)  Follow up Falls evaluation completed Falls evaluation completed Falls evaluation completed Falls evaluation completed Falls evaluation completed    [place]:  Any stairs in or around the home? Yes  If so, are there any without handrails? Yes  Home free of loose throw rugs in walkways, pet beds, electrical cords, etc? Yes  Adequate lighting in your home to reduce risk of falls? Yes   ASSISTIVE DEVICES UTILIZED TO PREVENT FALLS:  Life alert? Yes  Use of a cane, walker or w/c? Yes  Grab bars in the bathroom? Yes  Shower chair or bench in shower? Yes  Elevated toilet seat or a handicapped toilet? Yes   Cognitive Function:        Immunizations  Immunization History  Administered Date(s) Administered   Fluad Quad(high Dose 65+) 05/14/2020, 05/08/2021   Influenza, High Dose Seasonal PF 05/06/2017, 05/01/2018   Influenza-Unspecified 05/14/2011, 04/04/2012, 04/23/2013, 05/17/2014, 05/01/2015, 04/20/2016, 05/02/2018, 04/03/2019   PFIZER(Purple Top)SARS-COV-2 Vaccination 08/24/2019, 09/09/2019, 05/01/2020   PPD Test 05/13/2016, 05/27/2016, 11/12/2016   Pneumococcal  Conjugate-13 01/24/2014, 05/16/2020   Pneumococcal Polysaccharide-23 03/27/2012, 11/12/2016, 08/09/2017   Tdap 01/24/2014   Zoster Recombinat (Shingrix) 04/07/2018, 09/04/2018    TDAP status: Up to date  Flu Vaccine status: Up to date  Pneumococcal vaccine status: Up to date  Covid-19 vaccine status: Completed vaccines  Qualifies for Shingles Vaccine? Yes   Zostavax completed Yes   Shingrix Completed?: Yes  Screening Tests Health Maintenance  Topic Date Due   DEXA SCAN  Never done   COVID-19 Vaccine (4 - Booster) 06/26/2020   TETANUS/TDAP  01/25/2024   Pneumonia Vaccine 76+ Years old  Completed   INFLUENZA VACCINE  Completed   Hepatitis C Screening  Completed   Zoster Vaccines- Shingrix  Completed   HPV VACCINES  Aged Out    Health Maintenance  Health Maintenance Due  Topic Date Due   DEXA SCAN  Never done   COVID-19 Vaccine (4 - Booster) 06/26/2020    Colorectal cancer screening: No longer required.   Mammogram status: No longer required due to age.  Bone Density status: Completed 2 weeks ago . Results reflect: Bone density results: NORMAL. Repeat every 5 years.  Lung Cancer Screening: (Low Dose CT Chest recommended if Age 36-80 years, 30 pack-year currently smoking OR have quit w/in 15years.) does not qualify.     Additional Screening:  Hepatitis C Screening: does qualify; Completed 08-25-20  Vision Screening: Recommended annual ophthalmology exams for early detection of glaucoma and other disorders of the eye. Is the patient up to date with their annual eye exam?  Yes  Who is the provider or what is the name of the office in which the patient attends annual eye exams? [facility] Hoff If pt is not established with a provider, would they like to be referred to a provider to establish care? No .   Dental Screening: Recommended annual dental exams for proper oral hygiene  Community Resource Referral / Chronic Care Management: CRR required this visit?   No   CCM required this visit?  No      Plan:     I have personally reviewed and noted the following in the patients chart:   Medical and social history Use of alcohol, tobacco or illicit drugs  Current medications and supplements including opioid prescriptions.  Functional ability and status Nutritional status Physical activity Advanced directives List of other physicians Hospitalizations, surgeries, and ER visits in previous 12 months Vitals Screenings to include cognitive, depression, and falls Referrals and appointments  In addition, I have reviewed and discussed with patient certain preventive protocols, quality metrics, and best practice recommendations. A written personalized care plan for preventive services as well as general preventive health recommendations were provided to patient.     Shelda Altes, CMA   09/14/2021   Nurse Notes:  Ms. Coe , Thank you for taking time to come for your Medicare Wellness Visit. I appreciate your ongoing commitment to your health goals. Please review the following plan we discussed and let me know if I can assist you in the future.   These are the goals we discussed:  Goals      Patient Stated     Would like to  walk more         This is a list of the screening recommended for you and due dates:  Health Maintenance  Topic Date Due   DEXA scan (bone density measurement)  Never done   COVID-19 Vaccine (4 - Booster) 06/26/2020   Tetanus Vaccine  01/25/2024   Pneumonia Vaccine  Completed   Flu Shot  Completed   Hepatitis C Screening: USPSTF Recommendation to screen - Ages 18-79 yo.  Completed   Zoster (Shingles) Vaccine  Completed   HPV Vaccine  Aged Out

## 2021-09-14 NOTE — Patient Instructions (Signed)
Kathy [name] , Thank you for taking time to come for your Medicare Wellness Visit. I appreciate your ongoing commitment to your health goals. Please review the following plan we discussed and let me know if I can assist you in the future.   Screening recommendations/referrals: Bone Density: Completed Recommended yearly ophthalmology/optometry visit for glaucoma screening and checkup Recommended yearly dental visit for hygiene and checkup  Vaccinations: Influenza vaccine:Completed  Pneumococcal vaccine: Completed Tdap vaccine: Completed Shingles vaccine: Completed    Advanced directives: declined information  Conditions/risks identified: hypertension  Next appointment: 1 year   Preventive Care 80 Years and Older, Female Preventive care refers to lifestyle choices and visits with your health care provider that can promote health and wellness. What does preventive care include? A yearly physical exam. This is also called an annual well check. Dental exams once or twice a year. Routine eye exams. Ask your health care provider how often you should have your eyes checked. Personal lifestyle choices, including: Daily care of your teeth and gums. Regular physical activity. Eating a healthy diet. Avoiding tobacco and drug use. Limiting alcohol use. Practicing safe sex. Taking low-dose aspirin every day. Taking vitamin and mineral supplements as recommended by your health care provider. What happens during an annual well check? The services and screenings done by your health care provider during your annual well check will depend on your age, overall health, lifestyle risk factors, and family history of disease. Counseling  Your health care provider may ask you questions about your: Alcohol use. Tobacco use. Drug use. Emotional well-being. Home and relationship well-being. Sexual activity. Eating habits. History of falls. Memory and ability to understand (cognition). Work and  work Statistician. Reproductive health. Screening  You may have the following tests or measurements: Height, weight, and BMI. Blood pressure. Lipid and cholesterol levels. These may be checked every 5 years, or more frequently if you are over 57 years old. Skin check. Lung cancer screening. You may have this screening every year starting at age 74 if you have a 30-pack-year history of smoking and currently smoke or have quit within the past 15 years. Fecal occult blood test (FOBT) of the stool. You may have this test every year starting at age 76. Flexible sigmoidoscopy or colonoscopy. You may have a sigmoidoscopy every 5 years or a colonoscopy every 10 years starting at age 72. Hepatitis C blood test. Hepatitis B blood test. Sexually transmitted disease (STD) testing. Diabetes screening. This is done by checking your blood sugar (glucose) after you have not eaten for a while (fasting). You may have this done every 1-3 years. Bone density scan. This is done to screen for osteoporosis. You may have this done starting at age 16. Mammogram. This may be done every 1-2 years. Talk to your health care provider about how often you should have regular mammograms. Talk with your health care provider about your test results, treatment options, and if necessary, the need for more tests. Vaccines  Your health care provider may recommend certain vaccines, such as: Influenza vaccine. This is recommended every year. Tetanus, diphtheria, and acellular pertussis (Tdap, Td) vaccine. You may need a Td booster every 10 years. Zoster vaccine. You may need this after age 19. Pneumococcal 13-valent conjugate (PCV13) vaccine. One dose is recommended after age 72. Pneumococcal polysaccharide (PPSV23) vaccine. One dose is recommended after age 36. Talk to your health care provider about which screenings and vaccines you need and how often you need them. This information is not intended to  replace advice given to you  by your health care provider. Make sure you discuss any questions you have with your health care provider. Document Released: 08/15/2015 Document Revised: 04/07/2016 Document Reviewed: 05/20/2015 Elsevier Interactive Patient Education  2017 [place] Prevention in the Home Falls can cause injuries. They can happen to people of all ages. There are many things you can do to make your home safe and to help prevent falls. What can I do on the outside of my home? Regularly fix the edges of walkways and driveways and fix any cracks. Remove anything that might make you trip as you walk through a door, such as a raised step or threshold. Trim any bushes or trees on the path to your home. Use bright outdoor lighting. Clear any walking paths of anything that might make someone trip, such as rocks or tools. Regularly check to see if handrails are loose or broken. Make sure that both sides of any steps have handrails. Any raised decks and porches should have guardrails on the edges. Have any leaves, snow, or ice cleared regularly. Use sand or salt on walking paths during winter. Clean up any spills in your garage right away. This includes oil or grease spills. What can I do in the bathroom? Use night lights. Install grab bars by the toilet and in the tub and shower. Do not use towel bars as grab bars. Use non-skid mats or decals in the tub or shower. If you need to sit down in the shower, use a plastic, non-slip stool. Keep the floor dry. Clean up any water that spills on the floor as soon as it happens. Remove soap buildup in the tub or shower regularly. Attach bath mats securely with double-sided non-slip rug tape. Do not have throw rugs and other things on the floor that can make you trip. What can I do in the bedroom? Use night lights. Make sure that you have a light by your bed that is easy to reach. Do not use any sheets or blankets that are too big for your bed. They should not  hang down onto the floor. Have a firm chair that has side arms. You can use this for support while you get dressed. Do not have throw rugs and other things on the floor that can make you trip. What can I do in the kitchen? Clean up any spills right away. Avoid walking on wet floors. Keep items that you use a lot in easy-to-reach places. If you need to reach something above you, use a strong step stool that has a grab bar. Keep electrical cords out of the way. Do not use floor polish or wax that makes floors slippery. If you must use wax, use non-skid floor wax. Do not have throw rugs and other things on the floor that can make you trip. What can I do with my stairs? Do not leave any items on the stairs. Make sure that there are handrails on both sides of the stairs and use them. Fix handrails that are broken or loose. Make sure that handrails are as long as the stairways. Check any carpeting to make sure that it is firmly attached to the stairs. Fix any carpet that is loose or worn. Avoid having throw rugs at the top or bottom of the stairs. If you do have throw rugs, attach them to the floor with carpet tape. Make sure that you have a light switch at the top of the stairs and the  bottom of the stairs. If you do not have them, ask someone to add them for you. What else can I do to help prevent falls? Wear shoes that: Do not have high heels. Have rubber bottoms. Are comfortable and fit you well. Are closed at the toe. Do not wear sandals. If you use a stepladder: Make sure that it is fully opened. Do not climb a closed stepladder. Make sure that both sides of the stepladder are locked into place. Ask someone to hold it for you, if possible. Clearly mark and make sure that you can see: Any grab bars or handrails. First and last steps. Where the edge of each step is. Use tools that help you move around (mobility aids) if they are needed. These  include: Canes. Walkers. Scooters. Crutches. Turn on the lights when you go into a dark area. Replace any light bulbs as soon as they burn out. Set up your furniture so you have a clear path. Avoid moving your furniture around. If any of your floors are uneven, fix them. If there are any pets around you, be aware of where they are. Review your medicines with your doctor. Some medicines can make you feel dizzy. This can increase your chance of falling. Ask your doctor what other things that you can do to help prevent falls. This information is not intended to replace advice given to you by your health care provider. Make sure you discuss any questions you have with your health care provider. Document Released: 05/15/2009 Document Revised: 12/25/2015 Document Reviewed: 08/23/2014 Elsevier Interactive Patient Education  2017 [employer].

## 2021-09-16 ENCOUNTER — Other Ambulatory Visit: Payer: Self-pay

## 2021-09-16 ENCOUNTER — Encounter: Payer: Self-pay | Admitting: Internal Medicine

## 2021-09-16 ENCOUNTER — Ambulatory Visit (INDEPENDENT_AMBULATORY_CARE_PROVIDER_SITE_OTHER): Payer: Medicare Other | Admitting: Internal Medicine

## 2021-09-16 VITALS — BP 108/62 | HR 76 | Resp 18 | Ht 61.0 in | Wt 169.0 lb

## 2021-09-16 DIAGNOSIS — Z0001 Encounter for general adult medical examination with abnormal findings: Secondary | ICD-10-CM

## 2021-09-16 DIAGNOSIS — E876 Hypokalemia: Secondary | ICD-10-CM | POA: Diagnosis not present

## 2021-09-16 DIAGNOSIS — K219 Gastro-esophageal reflux disease without esophagitis: Secondary | ICD-10-CM

## 2021-09-16 MED ORDER — POTASSIUM CHLORIDE CRYS ER 20 MEQ PO TBCR: EXTENDED_RELEASE_TABLET | ORAL | 3 refills | Status: DC

## 2021-09-16 MED ORDER — FAMOTIDINE 40 MG PO TABS: 40.0000 mg | ORAL_TABLET | Freq: Every day | ORAL | 5 refills | Status: DC

## 2021-09-16 NOTE — Assessment & Plan Note (Signed)
Takes potassium supplement along with Lasix (for leg edema)

## 2021-09-16 NOTE — Assessment & Plan Note (Signed)

## 2021-09-16 NOTE — Progress Notes (Signed)
Established Patient Office Visit  Subjective:  Patient ID: Kathy [name], female    DOB: 1941/06/28  Age: 81 y.o. MRN: [id number]  CC:  Chief Complaint  Patient presents with   Annual Exam    Annual exam     HPI Kathy [name] is a 81 y.o. female with past medical history of HTN, RA, hypothyroidism, osteoporosis, HLD, anxiety and polyarthritis who presents for annual physical.  HTN: BP is well-controlled. Takes medications regularly. Patient denies headache, dizziness, chest pain, dyspnea or palpitations.  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  She does not have LAFB according to cardiology eval.  Hypothyroidism: Followed by Dr. Chalmers Cater.  She states that her recent blood tests at her office was WNL.  She continues to take levothyroxine 88 mcg daily.  Denies any recent change in weight or appetite.  She also had DEXA scan recently, which was better.  She is on Prolia for it currently.  She complains of intermittent epigastric pain and acid reflux at times.  She was on omeprazole in the past for history of PUD.  She agrees to try Pepcid for now.  Denies any nausea or vomiting currently.  Past Medical History:  Diagnosis Date   Abnormal findings on diagnostic imaging of heart and coronary circulation    Anemia    FROM BLEEDING ULCER   Anemia    Anxiety    takes Alprazolam daily as needed   Anxiety disorder, unspecified    Arthritis    dx with RA 2017   Bariatric surgery status    Cataract    Diverticulosis    Essential (primary) hypertension    Gastro-esophageal reflux disease with esophagitis    GERD (gastroesophageal reflux disease)    Headache(784.0)    occasionally   History of blood transfusion    no abnormal reaction noted   History of bronchitis    last time many yrs ago   Hyperlipidemia    PT DENIES THIS DX -  ON NO MEDS AND NO ONE HAS TOLD HER   Hypertension    takes Amlodipine daily   Hypothyroidism    takes Synthroid  daily   Hypothyroidism, unspecified    Insomnia    Joint pain    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   Localized edema    Nocturia    Numbness    occasionally left arm at night   Obesity    Osteoporosis    takes Fosamax weekly   Pain in joint involving pelvic region and thigh    Peripheral edema    takes Lasix daily as needed   PONV (postoperative nausea and vomiting)    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/2017   Rheumatoid arthritis, unspecified (HCC)    Rotator cuff arthropathy, 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   Sleep apnea    Phreesia 08/22/2020   Stomach ulcer    Thyroid disease    Phreesia 08/22/2020   Unspecified injury of muscle(s) and tendon(s) of the rotator cuff of left shoulder, subsequent encounter    Unspecified osteoarthritis, unspecified site    Wears glasses    Wears partial dentures     Past Surgical History:  Procedure Laterality Date   ABDOMINAL HYSTERECTOMY     partial   cataract surgery Bilateral    CHOLECYSTECTOMY     COLONOSCOPY     EYE SURGERY     CATARACTS BOTH   gastric bypass surgery     JOINT  REPLACEMENT Bilateral    hip   REVERSE SHOULDER ARTHROPLASTY Right 05/11/2016   Procedure: REVERSE SHOULDER ARTHROPLASTY;  Surgeon: Marchia Bond, MD;  Location: [place];  Service: Orthopedics;  Laterality: Right;   REVISION TOTAL HIP ARTHROPLASTY Left 10/03/2013   DR Mayer Camel   ROTATOR CUFF REPAIR     TOTAL HIP REVISION Left 10/03/2013   Procedure: TOTAL HIP REVISION- left;  Surgeon: Kerin Salen, MD;  Location: [place];  Service: Orthopedics;  Laterality: Left;   TOTAL KNEE ARTHROPLASTY Left 11/29/2017   Procedure: LEFT TOTAL KNEE ARTHROPLASTY;  Surgeon: Marchia Bond, MD;  Location: [place];  Service: Orthopedics;  Laterality: Left;   TOTAL SHOULDER ARTHROPLASTY Left 11/09/2016   Procedure: TOTAL REVERSE SHOULDER ARTHROPLASTY;  Surgeon: Marchia Bond, MD;  Location: [place];  Service: Orthopedics;  Laterality: Left;   TOTAL SHOULDER REPLACEMENT Left  10/2016   TOTAL THYROIDECTOMY      Family History  Problem Relation Age of Onset   Diabetes Mother    Congestive Heart Failure Mother    Lung cancer Father    Thrombosis Sister    CAD Brother        CABG    Social History   Socioeconomic History   Marital status: Divorced    Spouse name: Not on file   Number of children: Not on file   Years of education: Not on file   Highest education level: Not on file  Occupational History   Occupation: disabled  Tobacco Use   Smoking status: Never   Smokeless tobacco: Never  Vaping Use   Vaping Use: Never used  Substance and Sexual Activity   Alcohol use: No   Drug use: No   Sexual activity: Not Currently    Birth control/protection: Surgical  Other Topics Concern   Not on file  Social History Narrative   Not on file   Social Determinants of Health   Financial Resource Strain: Low Risk    Difficulty of Paying Living Expenses: Not hard at all  Food Insecurity: No Food Insecurity   Worried About Charity fundraiser in the Last Year: Never true   [place] in the Last Year: Never true  Transportation Needs: No Transportation Needs   Lack of Transportation (Medical): No   Lack of Transportation (Non-Medical): No  Physical Activity: Sufficiently Active   Days of Exercise per Week: 6 days   Minutes of Exercise per Session: 40 min  Stress: No Stress Concern Present   Feeling of Stress : Not at all  Social Connections: Moderately Isolated   Frequency of Communication with Friends and Family: More than three times a week   Frequency of Social Gatherings with Friends and Family: More than three times a week   Attends Religious Services: More than 4 times per year   Active Member of [employer] or Organizations: No   Attends Archivist Meetings: Never   Marital Status: Divorced  Human resources officer Violence: Not At Risk   Fear of Current or Ex-Partner: No   Emotionally Abused: No   Physically Abused: No   Sexually  Abused: No    Outpatient Medications Prior to Visit  Medication Sig Dispense Refill   Adalimumab (HUMIRA) 40 MG/0.4ML PSKT Injection every 2 weeks 2 each    ALPRAZolam (XANAX) 0.25 MG tablet Take 0.25 mg by mouth at bedtime as needed for anxiety or sleep.     amLODipine (NORVASC) 5 MG tablet TAKE (1) TABLET BY MOUTH ONCE DAILY. [place]  tablet 3   calcium carbonate (OSCAL) 1500 (600 Ca) MG TABS tablet Take 300-600 mg by mouth 3 (three) times daily. Take 1 tablet (600 mg) in the morning, 0.5 tablet (300 mg) at noon, & 1 tablet (600 mg) in the evening.     celecoxib (CELEBREX) 100 MG capsule TAKE ONE CAPSULE BY MOUTH ONCE DAILY. 30 capsule 0   Cholecalciferol 1000 UNITS TBDP Take 1,000 Units by mouth 2 (two) times daily.      denosumab (PROLIA) 60 MG/ML SOSY injection INJECT AS DIRECTED EVERY 6 MONTHS 180 1 mL 1   diclofenac Sodium (VOLTAREN) 1 % GEL APPLY 2 GRAMS TO AFFECTED AREA FOUR TIMES A DAY. 1050 g 3   furosemide (LASIX) 20 MG tablet TAKE ONE TABLET BY MOUTH ONCE DAILY. 30 tablet 0   levothyroxine (SYNTHROID) 88 MCG tablet Take 1 tablet (88 mcg total) by mouth daily. 90 tablet 1   Multiple Vitamin (MULTIVITAMIN WITH MINERALS) TABS tablet Take 1 tablet by mouth daily.      Omega-3 Fatty Acids (FISH OIL) 1000 MG CAPS Take 1,000 mg by mouth daily.     traMADol (ULTRAM) 50 MG tablet Take 50 mg by mouth every 6 (six) hours as needed for moderate pain.      potassium chloride SA (KLOR-CON) 20 MEQ tablet TAKE 1 TABLET (20 MEQ) BY MOUTH DAILY PRN- EDEMA 90 tablet 3   No facility-administered medications prior to visit.    Allergies  Allergen Reactions   Aspirin Nausea And Vomiting and Other (See Comments)    Adult Aspirin: upset stomach / irritates ulcer    Macrobid [Nitrofurantoin]     ROS Review of Systems  Constitutional:  Negative for chills and fever.  HENT:  Negative for congestion, sinus pressure, sinus pain and sore throat.   Eyes:  Negative for pain and discharge.  Respiratory:   Negative for cough and shortness of breath.   Cardiovascular:  Negative for chest pain and palpitations.  Gastrointestinal:  Negative for abdominal pain, constipation, diarrhea, nausea and vomiting.  Endocrine: Negative for polydipsia and polyuria.  Genitourinary:  Negative for dysuria and hematuria.  Musculoskeletal:  Positive for arthralgias and back pain. Negative for neck pain and neck stiffness.  Skin:  Negative for rash.  Neurological:  Negative for dizziness and weakness.  Psychiatric/Behavioral:  Negative for agitation and behavioral problems.      Objective:    Physical Exam Vitals reviewed.  Constitutional:      General: She is not in acute distress.    Appearance: She is not diaphoretic.  HENT:     Head: Normocephalic and atraumatic.     Nose: Nose normal.     Mouth/Throat:     Mouth: Mucous membranes are moist.  Eyes:     General: No scleral icterus.    Extraocular Movements: Extraocular movements intact.     Pupils: Pupils are equal, round, and reactive to light.  Cardiovascular:     Rate and Rhythm: Normal rate and regular rhythm.     Pulses: Normal pulses.     Heart sounds: Normal heart sounds. No murmur heard. Pulmonary:     Breath sounds: Normal breath sounds. No wheezing or rales.  Abdominal:     Palpations: Abdomen is soft.     Tenderness: There is no abdominal tenderness.  Musculoskeletal:        General: Tenderness (Over MCPs and ICPs over b/l UE) and deformity (Ulnar deviation of b/l hands) present.     Cervical back: Neck  supple. No tenderness.     Right lower leg: No edema.     Left lower leg: No edema.  Skin:    General: Skin is warm.     Findings: No rash.  Neurological:     General: No focal deficit present.     Mental Status: She is alert and oriented to person, place, and time.     Cranial Nerves: No cranial nerve deficit.     Sensory: No sensory deficit.     Motor: No weakness.  Psychiatric:        Mood and Affect: Mood normal.         Behavior: Behavior normal.    BP 108/62 (BP Location: Right Arm, Patient Position: Sitting, Cuff Size: Normal)    Pulse 76    Resp 18    Ht 5' 1" (1.549 m)    Wt 169 lb (76.7 kg)    SpO2 97%    BMI 31.93 kg/m  Wt Readings from Last 3 Encounters:  09/16/21 169 lb (76.7 kg)  06/17/21 171 lb 3.2 oz (77.7 kg)  03/16/21 162 lb 0.6 oz (73.5 kg)    Lab Results  Component Value Date   TSH 0.320 (L) 03/18/2021   Lab Results  Component Value Date   WBC 4.0 03/18/2021   HGB 11.4 03/18/2021   HCT 35.4 03/18/2021   MCV 97 03/18/2021   PLT 186 03/18/2021   Lab Results  Component Value Date   NA 137 03/18/2021   K 4.1 03/18/2021   CO2 27 03/18/2021   GLUCOSE 81 03/18/2021   BUN 11 03/18/2021   CREATININE 0.64 03/18/2021   BILITOT 0.4 03/18/2021   ALKPHOS 68 03/18/2021   AST 21 03/18/2021   ALT 13 03/18/2021   PROT 6.9 03/18/2021   ALBUMIN 3.2 (L) 03/18/2021   CALCIUM 9.1 03/18/2021   ANIONGAP 9 11/30/2017   EGFR 90 03/18/2021   Lab Results  Component Value Date   CHOL 165 08/25/2020   Lab Results  Component Value Date   HDL 63 08/25/2020   Lab Results  Component Value Date   LDLCALC 86 08/25/2020   Lab Results  Component Value Date   TRIG 75 08/25/2020   Lab Results  Component Value Date   CHOLHDL 2.6 08/25/2020   Lab Results  Component Value Date   HGBA1C 5.1 05/23/2017      Assessment & Plan:   Problem List Items Addressed This Visit       Digestive   GERD (gastroesophageal reflux disease)    Started Pepcid as needed Would avoid PPI for now due to her osteoporosis      Relevant Medications   famotidine (PEPCID) 40 MG tablet     Other   Encounter for general adult medical examination with abnormal findings - Primary    Physical exam as documented. Counseling done  re healthy lifestyle involving commitment to 150 minutes exercise per week, heart healthy diet, and attaining healthy weight.The importance of adequate sleep also discussed. Changes in  health habits are decided on by the patient with goals and time frames  set for achieving them. Immunization and cancer screening needs are specifically addressed at this visit.      Hypokalemia    Takes potassium supplement along with Lasix (for leg edema)      Relevant Medications   potassium chloride SA (KLOR-CON M) 20 MEQ tablet    Meds ordered this encounter  Medications   potassium chloride SA (KLOR-CON M) 20  MEQ tablet    Sig: TAKE 1 TABLET (20 MEQ) BY MOUTH DAILY PRN- EDEMA    Dispense:  90 tablet    Refill:  3   famotidine (PEPCID) 40 MG tablet    Sig: Take 1 tablet (40 mg total) by mouth daily.    Dispense:  30 tablet    Refill:  5    Follow-up: Return in about 6 months (around 03/16/2022) for HTN and hypothyrodism.    Lindell Spar, MD

## 2021-09-16 NOTE — Assessment & Plan Note (Signed)
Started Pepcid as needed Would avoid PPI for now due to her osteoporosis

## 2021-09-16 NOTE — Patient Instructions (Signed)
Please take Pepcid as needed for acid reflux.  Please continue to take other medications as prescribed.

## 2021-09-23 ENCOUNTER — Encounter: Payer: Self-pay | Admitting: *Deleted

## 2021-10-08 DIAGNOSIS — Z79899 Other long term (current) drug therapy: Secondary | ICD-10-CM | POA: Diagnosis not present

## 2021-10-08 DIAGNOSIS — M2550 Pain in unspecified joint: Secondary | ICD-10-CM | POA: Diagnosis not present

## 2021-10-08 DIAGNOSIS — M0609 Rheumatoid arthritis without rheumatoid factor, multiple sites: Secondary | ICD-10-CM | POA: Diagnosis not present

## 2021-10-08 DIAGNOSIS — M1991 Primary osteoarthritis, unspecified site: Secondary | ICD-10-CM | POA: Diagnosis not present

## 2021-10-15 ENCOUNTER — Other Ambulatory Visit: Payer: Self-pay

## 2021-10-15 ENCOUNTER — Other Ambulatory Visit: Payer: Self-pay | Admitting: Internal Medicine

## 2021-10-15 ENCOUNTER — Other Ambulatory Visit (HOSPITAL_COMMUNITY): Payer: Self-pay

## 2021-10-15 MED ORDER — PROLIA 60 MG/ML ~~LOC~~ SOSY
PREFILLED_SYRINGE | SUBCUTANEOUS | 1 refills | Status: DC
  Filled 2021-10-15: qty 1, 180d supply, fill #0
  Filled 2022-04-07: qty 1, 180d supply, fill #1

## 2021-10-21 ENCOUNTER — Other Ambulatory Visit (HOSPITAL_COMMUNITY): Payer: Self-pay

## 2021-10-27 ENCOUNTER — Other Ambulatory Visit (HOSPITAL_COMMUNITY): Payer: Self-pay

## 2021-10-28 ENCOUNTER — Other Ambulatory Visit (HOSPITAL_COMMUNITY): Payer: Self-pay

## 2021-10-28 DIAGNOSIS — M810 Age-related osteoporosis without current pathological fracture: Secondary | ICD-10-CM | POA: Diagnosis not present

## 2021-10-29 ENCOUNTER — Other Ambulatory Visit (HOSPITAL_COMMUNITY): Payer: Self-pay | Admitting: Internal Medicine

## 2021-10-29 DIAGNOSIS — Z1231 Encounter for screening mammogram for malignant neoplasm of breast: Secondary | ICD-10-CM

## 2021-11-19 ENCOUNTER — Other Ambulatory Visit: Payer: Self-pay | Admitting: Internal Medicine

## 2021-11-19 DIAGNOSIS — R600 Localized edema: Secondary | ICD-10-CM

## 2021-11-26 DIAGNOSIS — M3501 Sicca syndrome with keratoconjunctivitis: Secondary | ICD-10-CM | POA: Diagnosis not present

## 2021-12-07 ENCOUNTER — Ambulatory Visit (HOSPITAL_COMMUNITY)
Admission: RE | Admit: 2021-12-07 | Discharge: 2021-12-07 | Disposition: A | Discharge status: Home / Self Care | Payer: Medicare Other | Source: Ambulatory Visit | Attending: Internal Medicine | Admitting: Internal Medicine

## 2021-12-07 DIAGNOSIS — Z1231 Encounter for screening mammogram for malignant neoplasm of breast: Secondary | ICD-10-CM | POA: Insufficient documentation

## 2021-12-08 ENCOUNTER — Other Ambulatory Visit (HOSPITAL_COMMUNITY): Payer: Self-pay | Admitting: Internal Medicine

## 2021-12-08 DIAGNOSIS — R928 Other abnormal and inconclusive findings on diagnostic imaging of breast: Secondary | ICD-10-CM

## 2021-12-08 DIAGNOSIS — R921 Mammographic calcification found on diagnostic imaging of breast: Secondary | ICD-10-CM

## 2021-12-15 ENCOUNTER — Ambulatory Visit (HOSPITAL_COMMUNITY)
Admission: RE | Admit: 2021-12-15 | Discharge: 2021-12-15 | Disposition: A | Discharge status: Home / Self Care | Payer: Medicare Other | Source: Ambulatory Visit | Attending: Internal Medicine | Admitting: Internal Medicine

## 2021-12-15 DIAGNOSIS — R921 Mammographic calcification found on diagnostic imaging of breast: Secondary | ICD-10-CM | POA: Insufficient documentation

## 2021-12-15 DIAGNOSIS — R928 Other abnormal and inconclusive findings on diagnostic imaging of breast: Secondary | ICD-10-CM | POA: Diagnosis not present

## 2021-12-22 ENCOUNTER — Other Ambulatory Visit: Payer: Self-pay | Admitting: Internal Medicine

## 2021-12-22 DIAGNOSIS — R600 Localized edema: Secondary | ICD-10-CM

## 2022-01-01 DIAGNOSIS — M79643 Pain in unspecified hand: Secondary | ICD-10-CM | POA: Diagnosis not present

## 2022-01-01 DIAGNOSIS — M545 Low back pain, unspecified: Secondary | ICD-10-CM | POA: Diagnosis not present

## 2022-01-01 DIAGNOSIS — G5600 Carpal tunnel syndrome, unspecified upper limb: Secondary | ICD-10-CM | POA: Diagnosis not present

## 2022-01-01 DIAGNOSIS — Z79891 Long term (current) use of opiate analgesic: Secondary | ICD-10-CM | POA: Diagnosis not present

## 2022-01-01 DIAGNOSIS — M25569 Pain in unspecified knee: Secondary | ICD-10-CM | POA: Diagnosis not present

## 2022-01-10 ENCOUNTER — Inpatient Hospital Stay (HOSPITAL_COMMUNITY)
Admission: EM | Admit: 2022-01-10 | Discharge: 2022-01-14 | DRG: 378 | Disposition: A | Discharge status: Home / Self Care | Payer: Medicare Other | Attending: Internal Medicine | Admitting: Internal Medicine

## 2022-01-10 ENCOUNTER — Other Ambulatory Visit: Payer: Self-pay

## 2022-01-10 ENCOUNTER — Encounter (HOSPITAL_COMMUNITY): Payer: Self-pay

## 2022-01-10 DIAGNOSIS — Z96612 Presence of left artificial shoulder joint: Secondary | ICD-10-CM | POA: Diagnosis present

## 2022-01-10 DIAGNOSIS — K254 Chronic or unspecified gastric ulcer with hemorrhage: Secondary | ICD-10-CM | POA: Diagnosis not present

## 2022-01-10 DIAGNOSIS — I959 Hypotension, unspecified: Secondary | ICD-10-CM | POA: Diagnosis not present

## 2022-01-10 DIAGNOSIS — Q438 Other specified congenital malformations of intestine: Secondary | ICD-10-CM | POA: Diagnosis not present

## 2022-01-10 DIAGNOSIS — I10 Essential (primary) hypertension: Secondary | ICD-10-CM | POA: Diagnosis present

## 2022-01-10 DIAGNOSIS — Z85850 Personal history of malignant neoplasm of thyroid: Secondary | ICD-10-CM | POA: Diagnosis not present

## 2022-01-10 DIAGNOSIS — Z79899 Other long term (current) drug therapy: Secondary | ICD-10-CM | POA: Diagnosis not present

## 2022-01-10 DIAGNOSIS — K284 Chronic or unspecified gastrojejunal ulcer with hemorrhage: Secondary | ICD-10-CM | POA: Diagnosis present

## 2022-01-10 DIAGNOSIS — M810 Age-related osteoporosis without current pathological fracture: Secondary | ICD-10-CM | POA: Diagnosis present

## 2022-01-10 DIAGNOSIS — K2100 Gastro-esophageal reflux disease with esophagitis, without bleeding: Secondary | ICD-10-CM | POA: Diagnosis present

## 2022-01-10 DIAGNOSIS — E785 Hyperlipidemia, unspecified: Secondary | ICD-10-CM | POA: Diagnosis not present

## 2022-01-10 DIAGNOSIS — D696 Thrombocytopenia, unspecified: Secondary | ICD-10-CM | POA: Diagnosis not present

## 2022-01-10 DIAGNOSIS — Z96611 Presence of right artificial shoulder joint: Secondary | ICD-10-CM | POA: Diagnosis present

## 2022-01-10 DIAGNOSIS — Z9884 Bariatric surgery status: Secondary | ICD-10-CM

## 2022-01-10 DIAGNOSIS — Z8249 Family history of ischemic heart disease and other diseases of the circulatory system: Secondary | ICD-10-CM

## 2022-01-10 DIAGNOSIS — Z79890 Hormone replacement therapy (postmenopausal): Secondary | ICD-10-CM

## 2022-01-10 DIAGNOSIS — Z90711 Acquired absence of uterus with remaining cervical stump: Secondary | ICD-10-CM

## 2022-01-10 DIAGNOSIS — E890 Postprocedural hypothyroidism: Secondary | ICD-10-CM | POA: Diagnosis not present

## 2022-01-10 DIAGNOSIS — K625 Hemorrhage of anus and rectum: Secondary | ICD-10-CM | POA: Diagnosis not present

## 2022-01-10 DIAGNOSIS — Z96652 Presence of left artificial knee joint: Secondary | ICD-10-CM | POA: Diagnosis present

## 2022-01-10 DIAGNOSIS — Z7983 Long term (current) use of bisphosphonates: Secondary | ICD-10-CM

## 2022-01-10 DIAGNOSIS — M069 Rheumatoid arthritis, unspecified: Secondary | ICD-10-CM | POA: Diagnosis not present

## 2022-01-10 DIAGNOSIS — D62 Acute posthemorrhagic anemia: Secondary | ICD-10-CM | POA: Diagnosis not present

## 2022-01-10 DIAGNOSIS — K922 Gastrointestinal hemorrhage, unspecified: Secondary | ICD-10-CM | POA: Diagnosis present

## 2022-01-10 DIAGNOSIS — M1712 Unilateral primary osteoarthritis, left knee: Secondary | ICD-10-CM | POA: Diagnosis present

## 2022-01-10 DIAGNOSIS — Z96642 Presence of left artificial hip joint: Secondary | ICD-10-CM | POA: Diagnosis present

## 2022-01-10 DIAGNOSIS — F419 Anxiety disorder, unspecified: Secondary | ICD-10-CM | POA: Diagnosis present

## 2022-01-10 DIAGNOSIS — Z888 Allergy status to other drugs, medicaments and biological substances status: Secondary | ICD-10-CM

## 2022-01-10 DIAGNOSIS — E039 Hypothyroidism, unspecified: Secondary | ICD-10-CM | POA: Diagnosis present

## 2022-01-10 DIAGNOSIS — K289 Gastrojejunal ulcer, unspecified as acute or chronic, without hemorrhage or perforation: Secondary | ICD-10-CM | POA: Diagnosis not present

## 2022-01-10 LAB — CBC WITH DIFFERENTIAL/PLATELET
Abs Immature Granulocytes: 0.03 10*3/uL (ref 0.00–0.07)
Basophils Absolute: 0 10*3/uL (ref 0.0–0.1)
Basophils Relative: 0 %
Eosinophils Absolute: 0 10*3/uL (ref 0.0–0.5)
Eosinophils Relative: 0 %
HCT: 25.2 % — ABNORMAL LOW (ref 36.0–46.0)
Hemoglobin: 7.9 g/dL — ABNORMAL LOW (ref 12.0–15.0)
Immature Granulocytes: 0 %
Lymphocytes Relative: 28 %
Lymphs Abs: 2.3 10*3/uL (ref 0.7–4.0)
MCH: 32.4 pg (ref 26.0–34.0)
MCHC: 31.3 g/dL (ref 30.0–36.0)
MCV: 103.3 fL — ABNORMAL HIGH (ref 80.0–100.0)
Monocytes Absolute: 0.6 10*3/uL (ref 0.1–1.0)
Monocytes Relative: 8 %
Neutro Abs: 5.3 10*3/uL (ref 1.7–7.7)
Neutrophils Relative %: 64 %
Platelets: 175 10*3/uL (ref 150–400)
RBC: 2.44 MIL/uL — ABNORMAL LOW (ref 3.87–5.11)
RDW: 15.3 % (ref 11.5–15.5)
WBC: 8.2 10*3/uL (ref 4.0–10.5)
nRBC: 0.2 % (ref 0.0–0.2)

## 2022-01-10 LAB — COMPREHENSIVE METABOLIC PANEL
ALT: 14 U/L (ref 0–44)
AST: 23 U/L (ref 15–41)
Albumin: 2.7 g/dL — ABNORMAL LOW (ref 3.5–5.0)
Alkaline Phosphatase: 35 U/L — ABNORMAL LOW (ref 38–126)
Anion gap: 7 (ref 5–15)
BUN: 19 mg/dL (ref 8–23)
CO2: 23 mmol/L (ref 22–32)
Calcium: 9.4 mg/dL (ref 8.9–10.3)
Chloride: 107 mmol/L (ref 98–111)
Creatinine, Ser: 0.83 mg/dL (ref 0.44–1.00)
GFR, Estimated: >60 mL/min (ref >60)
Glucose, Bld: 114 mg/dL — ABNORMAL HIGH (ref 70–99)
Potassium: 4.9 mmol/L (ref 3.5–5.1)
Sodium: 137 mmol/L (ref 135–145)
Total Bilirubin: 0.9 mg/dL (ref 0.3–1.2)
Total Protein: 7 g/dL (ref 6.5–8.1)

## 2022-01-10 LAB — VITAMIN B12: Vitamin B-12: 1353 pg/mL — ABNORMAL HIGH (ref 180–914)

## 2022-01-10 LAB — FERRITIN: Ferritin: 18 ng/mL (ref 11–307)

## 2022-01-10 LAB — IRON AND TIBC
Iron: 64 ug/dL (ref 28–170)
Saturation Ratios: 21 % (ref 10.4–31.8)
TIBC: 305 ug/dL (ref 250–450)
UIBC: 241 ug/dL

## 2022-01-10 LAB — POC OCCULT BLOOD, ED: Fecal Occult Bld: POSITIVE — AB

## 2022-01-10 LAB — PREPARE RBC (CROSSMATCH)

## 2022-01-10 LAB — LIPASE, BLOOD: Lipase: 23 U/L (ref 11–51)

## 2022-01-10 MED ORDER — ACETAMINOPHEN 325 MG PO TABS: 650.0000 mg | ORAL_TABLET | Freq: Four times a day (QID) | ORAL | Status: DC | PRN

## 2022-01-10 MED ORDER — SODIUM CHLORIDE 0.9 % IV SOLN
10.0000 mL/h | Freq: Once | INTRAVENOUS | Status: AC
  Administered 2022-01-10: 10 mL/h via INTRAVENOUS

## 2022-01-10 MED ORDER — ALPRAZOLAM 0.25 MG PO TABS
0.2500 mg | ORAL_TABLET | Freq: Every evening | ORAL | Status: DC | PRN
  Administered 2022-01-10 – 2022-01-13 (×4): 0.25 mg via ORAL
  Filled 2022-01-10 (×4): qty 1

## 2022-01-10 MED ORDER — PANTOPRAZOLE 80MG IVPB - SIMPLE MED
80.0000 mg | Freq: Once | INTRAVENOUS | Status: AC
  Administered 2022-01-10: 80 mg via INTRAVENOUS
  Filled 2022-01-10: qty 80

## 2022-01-10 MED ORDER — ONDANSETRON HCL 4 MG PO TABS: 4.0000 mg | ORAL_TABLET | Freq: Four times a day (QID) | ORAL | Status: DC | PRN

## 2022-01-10 MED ORDER — ACETAMINOPHEN 650 MG RE SUPP: 650.0000 mg | Freq: Four times a day (QID) | RECTAL | Status: DC | PRN

## 2022-01-10 MED ORDER — PANTOPRAZOLE INFUSION (NEW) - SIMPLE MED
8.0000 mg/h | INTRAVENOUS | Status: AC
  Administered 2022-01-10 – 2022-01-13 (×7): 8 mg/h via INTRAVENOUS
  Filled 2022-01-10: qty 100
  Filled 2022-01-10 (×4): qty 80
  Filled 2022-01-10: qty 100
  Filled 2022-01-10: qty 80
  Filled 2022-01-10: qty 100
  Filled 2022-01-10: qty 80
  Filled 2022-01-10: qty 100

## 2022-01-10 MED ORDER — LEVOTHYROXINE SODIUM 88 MCG PO TABS
88.0000 ug | ORAL_TABLET | Freq: Every day | ORAL | Status: DC
  Administered 2022-01-12 – 2022-01-14 (×3): 88 ug via ORAL
  Filled 2022-01-10 (×5): qty 1

## 2022-01-10 MED ORDER — TRAMADOL HCL 50 MG PO TABS: 50.0000 mg | ORAL_TABLET | Freq: Four times a day (QID) | ORAL | Status: DC | PRN

## 2022-01-10 MED ORDER — SODIUM CHLORIDE 0.9 % IV BOLUS
500.0000 mL | Freq: Once | INTRAVENOUS | Status: AC
  Administered 2022-01-10: 500 mL via INTRAVENOUS

## 2022-01-10 MED ORDER — PANTOPRAZOLE SODIUM 40 MG IV SOLR
40.0000 mg | Freq: Two times a day (BID) | INTRAVENOUS | Status: DC
  Administered 2022-01-14: 40 mg via INTRAVENOUS
  Filled 2022-01-10: qty 10

## 2022-01-10 MED ORDER — ONDANSETRON HCL 4 MG/2ML IJ SOLN: 4.0000 mg | Freq: Four times a day (QID) | INTRAMUSCULAR | Status: DC | PRN

## 2022-01-10 NOTE — H&P (Signed)
History and Physical    Kathy [name] MOQ:[id number] DOB: 05/28/1941 DOA: 01/10/2022  PCP: Lindell Spar, MD  Patient coming from: Home  I have personally briefly reviewed patient's old medical records in [place]  Chief Complaint: Bloody emesis and BRBPR  HPI: Kathy [name] is a 81 y.o. 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  Patient states around 2 AM on 01/10/2022 she had a single episode of bright red bloody emesis.  Since then she has been having bright red rectal bleeding.  She had an initial episode of epigastric pain which has since resolved.  She has not seen any other obvious bleeding.  She states that she is not taking any blood thinners including aspirin.  She denies any NSAID use.  She reports a history of peptic ulcer disease, per notes in care everywhere she was diagnosed with an anastomotic ulcer in the past.  ED Course  Labs/Imaging on admission: I have personally reviewed following labs and imaging studies.  Initial vitals showed BP 102/72, pulse 89, RR 14, temp 98.8 F, SPO2 100% on room air.  Labs show hemoglobin 7.9 (previously 11.4 on 03/18/2021), platelets 175,000, WBC 8.2, sodium 137, potassium 4.8, bicarb 23, BUN 19, creatinine 0.83, serum glucose 114, lipase 23, FOBT positive.  Patient was given 500 cc normal saline, IV Protonix 80 mg followed by continuous infusion.  1 unit PRBC transfusion was ordered.  EDP discussed with on-call gastroenterology who will see in consultation.  The hospitalist service was consulted to admit for further evaluation and management.  Review of Systems: All systems reviewed and are negative except as documented in history of present illness above.   Past Medical History:  Diagnosis Date   Abnormal findings on diagnostic imaging  of heart and coronary circulation    Anemia    FROM BLEEDING ULCER   Anemia    Anxiety    takes Alprazolam daily as needed   Anxiety disorder, unspecified    Arthritis    dx with RA 2017   Bariatric surgery status    Cataract    Diverticulosis    Essential (primary) hypertension    Gastro-esophageal reflux disease with esophagitis    GERD (gastroesophageal reflux disease)    Headache(784.0)    occasionally   History of blood transfusion    no abnormal reaction noted   History of bronchitis    last time many yrs ago   Hyperlipidemia    PT DENIES THIS DX -  ON NO MEDS AND NO ONE HAS TOLD HER   Hypertension    takes Amlodipine daily   Hypothyroidism    takes Synthroid daily   Hypothyroidism, unspecified    Insomnia    Joint pain    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   Localized edema    Nocturia    Numbness    occasionally left arm at night   Obesity    Osteoporosis    takes Fosamax weekly   Pain in joint involving pelvic region and thigh    Peripheral edema    takes Lasix daily as needed   PONV (postoperative nausea and vomiting)    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/2017   Rheumatoid arthritis, unspecified ([place])    Rotator cuff arthropathy, 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   Sleep apnea    Phreesia 08/22/2020  Stomach ulcer    Thyroid disease    Phreesia 08/22/2020   Unspecified injury of muscle(s) and tendon(s) of the rotator cuff of left shoulder, subsequent encounter    Unspecified osteoarthritis, unspecified site    Wears glasses    Wears partial dentures     Past Surgical History:  Procedure Laterality Date   ABDOMINAL HYSTERECTOMY     partial   cataract surgery Bilateral    CHOLECYSTECTOMY     COLONOSCOPY     EYE SURGERY     CATARACTS BOTH   gastric bypass surgery     JOINT REPLACEMENT Bilateral    hip   REVERSE SHOULDER ARTHROPLASTY Right 05/11/2016   Procedure: REVERSE SHOULDER ARTHROPLASTY;  Surgeon: Marchia Bond, MD;  Location:  [place] [facility];  Service: Orthopedics;  Laterality: Right;   REVISION TOTAL HIP ARTHROPLASTY Left 10/03/2013   DR Mayer Camel   ROTATOR CUFF REPAIR     TOTAL HIP REVISION Left 10/03/2013   Procedure: TOTAL HIP REVISION- left;  Surgeon: Kerin Salen, MD;  Location: [facility];  Service: Orthopedics;  Laterality: Left;   TOTAL KNEE ARTHROPLASTY Left 11/29/2017   Procedure: LEFT TOTAL KNEE ARTHROPLASTY;  Surgeon: Marchia Bond, MD;  Location: [place];  Service: Orthopedics;  Laterality: Left;   TOTAL SHOULDER ARTHROPLASTY Left 11/09/2016   Procedure: TOTAL REVERSE SHOULDER ARTHROPLASTY;  Surgeon: Marchia Bond, MD;  Location: [place];  Service: Orthopedics;  Laterality: Left;   TOTAL SHOULDER REPLACEMENT Left 10/2016   TOTAL THYROIDECTOMY      Social History:  reports that she has never smoked. She has never used smokeless tobacco. She reports that she does not drink alcohol and does not use drugs.  Allergies  Allergen Reactions   Aspirin Nausea And Vomiting and Other (See Comments)    Adult Aspirin: upset stomach / irritates ulcer    Macrobid [Nitrofurantoin]     Family History  Problem Relation Age of Onset   Diabetes Mother    Congestive Heart Failure Mother    Lung cancer Father    Thrombosis Sister    CAD Brother        CABG     Prior to Admission medications   Medication Sig Start Date End Date Taking? Authorizing Provider  Adalimumab (HUMIRA) 40 MG/0.4ML PSKT Injection every 2 weeks 04/16/20   Alycia Rossetti, MD  ALPRAZolam Duanne Moron) 0.25 MG tablet Take 0.25 mg by mouth at bedtime as needed for anxiety or sleep.    [provider]  amLODipine (NORVASC) 5 MG tablet TAKE (1) TABLET BY MOUTH ONCE DAILY. 03/16/21   Lindell Spar, MD  calcium carbonate (OSCAL) 1500 (600 Ca) MG TABS tablet Take 300-600 mg by mouth 3 (three) times daily. Take 1 tablet (600 mg) in the morning, 0.5 tablet (300 mg) at noon, & 1 tablet (600 mg) in the evening.    [provider]  celecoxib (CELEBREX) 100  MG capsule TAKE ONE CAPSULE BY MOUTH ONCE DAILY. 08/07/21   Lindell Spar, MD  Cholecalciferol 1000 UNITS TBDP Take 1,000 Units by mouth 2 (two) times daily.     [provider]  denosumab (PROLIA) 60 MG/ML SOSY injection '60mg'$  Subcutaneous every 6 months 180 days 10/15/21     diclofenac Sodium (VOLTAREN) 1 % GEL APPLY 2 GRAMS TO AFFECTED AREA FOUR TIMES A DAY. 03/16/21   Lindell Spar, MD  famotidine (PEPCID) 40 MG tablet Take 1 tablet (40 mg total) by mouth daily. 09/16/21   Lindell Spar, MD  furosemide (LASIX) 20 MG tablet TAKE ONE TABLET BY MOUTH ONCE DAILY. 12/22/21   Lindell Spar, MD  levothyroxine (SYNTHROID) 88 MCG tablet Take 1 tablet (88 mcg total) by mouth daily. 07/24/20   Alycia Rossetti, MD  Multiple Vitamin (MULTIVITAMIN WITH MINERALS) TABS tablet Take 1 tablet by mouth daily.     [provider]  Omega-3 Fatty Acids (FISH OIL) 1000 MG CAPS Take 1,000 mg by mouth daily.    [provider]  potassium chloride SA (KLOR-CON M) 20 MEQ tablet TAKE 1 TABLET (20 MEQ) BY MOUTH DAILY PRN- EDEMA 09/16/21   Lindell Spar, MD  traMADol (ULTRAM) 50 MG tablet Take 50 mg by mouth every 6 (six) hours as needed for moderate pain.     [provider]    Physical Exam: Vitals:   01/10/22 1405 01/10/22 1537  BP: 102/72 114/68  Pulse: 89 81  Resp: 14 14  Temp: 98.8 F (37.1 C)   TempSrc: Oral   SpO2: 100% 100%   Constitutional: Resting supine in bed, NAD, calm, comfortable Eyes: EOMI, lids and conjunctivae normal ENMT: Mucous membranes are moist. Posterior pharynx clear of any exudate or lesions.Normal dentition.  Neck: normal, supple, no masses. Respiratory: clear to auscultation bilaterally, no wheezing, no crackles. Normal respiratory effort. No accessory muscle use.  Cardiovascular: Regular rate and rhythm, no murmurs / rubs / gallops. No extremity edema. 2+ pedal pulses. Abdomen: no tenderness, no masses palpated. No hepatosplenomegaly. Bowel  sounds positive.  Musculoskeletal: no clubbing / cyanosis. No joint deformity upper and lower extremities. Good ROM, no contractures. Normal muscle tone.  Skin: no rashes, lesions, ulcers. No induration Neurologic: Sensation intact. Strength 5/5 in all 4.  Psychiatric: Normal judgment and insight. Alert and oriented x 3. Normal mood.   EKG: Not performed.  Assessment/Plan Principal Problem:   Acute GI bleeding Active Problems:   Hypertension   Hypothyroid   Rheumatoid arthritis ([place])   Anxiety disorder, unspecified   August Gosser Colon is a 81 y.o. 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  Assessment and Plan: * Acute GI bleeding Presenting with single episode of bloody emesis followed by BRBPR with transient epigastric pain.  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  Hemoglobin 7.9 compared to prior of 11.4 in August 2022. -She is being transfused 1 unit PRBC in the ED -Started on IV Protonix, continue -Allow clear liquids now, keep n.p.o. after midnight -GI to see in a.m.  Hypertension Hold antihypertensives with BP on the low side.  Anxiety disorder, unspecified Continue home Xanax 0.25 mg daily as needed.  Rheumatoid arthritis ([place]) Follows with rheumatology, on Humira.  Continue home tramadol as needed.  Hypothyroid Continue Synthroid.  DVT prophylaxis: SCDs Start: 01/10/22 1831 Code Status: Full code, confirmed with patient on admission. Family Communication: Discussed with patient, she has discussed with family. Disposition Plan: From home and likely discharge to home pending clinical progress. Consults called: Gastroenterology Severity of Illness: The appropriate patient status for this patient is OBSERVATION. Observation status is judged to be reasonable and necessary in order to  provide the required intensity of service to ensure the patient's safety. The patient's presenting symptoms, physical exam findings, and initial radiographic and laboratory data in the context of their medical condition is felt to place them at decreased risk for further clinical deterioration. Furthermore, it is anticipated that  the patient will be medically stable for discharge from the hospital within 2 midnights of admission.   Zada Finders MD [facility]  If 7PM-7AM, please contact night-coverage [email or web address]  01/10/2022, 6:39 PM

## 2022-01-10 NOTE — ED Triage Notes (Signed)
Patient reports that she vomited bright red blood and passed same at 0200 this am. Reports ulcer in past and had same. Denies abdominal pain. Alert and oriented

## 2022-01-10 NOTE — ED Provider Notes (Signed)
[facility] EMERGENCY DEPARTMENT Provider Note   CSN: [id number] Arrival date & time: 01/10/22  1347     History  No chief complaint on file.   Kathy [name] is a 81 y.o. female.  HPI She presents for evaluation of vomiting blood and rectal bleeding,, both started today.  She states "I have lost a lot of blood."  She denies shortness of breath, chest pain, weakness or dizziness.  She did have a near syncopal feeling, when she stood up earlier today that resolved when she sat down.  She has had previous GI bleeding from "ulcer."  She has not recently seen her gastroenterologist.    Home Medications Prior to Admission medications   Medication Sig Start Date End Date Taking? Authorizing Provider  Adalimumab (HUMIRA) 40 MG/0.4ML PSKT Injection every 2 weeks 04/16/20   Alycia Rossetti, MD  ALPRAZolam Duanne Moron) 0.25 MG tablet Take 0.25 mg by mouth at bedtime as needed for anxiety or sleep.    [provider]  amLODipine (NORVASC) 5 MG tablet TAKE (1) TABLET BY MOUTH ONCE DAILY. 03/16/21   Lindell Spar, MD  calcium carbonate (OSCAL) 1500 (600 Ca) MG TABS tablet Take 300-600 mg by mouth 3 (three) times daily. Take 1 tablet (600 mg) in the morning, 0.5 tablet (300 mg) at noon, & 1 tablet (600 mg) in the evening.    [provider]  celecoxib (CELEBREX) 100 MG capsule TAKE ONE CAPSULE BY MOUTH ONCE DAILY. 08/07/21   Lindell Spar, MD  Cholecalciferol 1000 UNITS TBDP Take 1,000 Units by mouth 2 (two) times daily.     [provider]  denosumab (PROLIA) 60 MG/ML SOSY injection 11m Subcutaneous every 6 months 180 days 10/15/21     diclofenac Sodium (VOLTAREN) 1 % GEL APPLY 2 GRAMS TO AFFECTED AREA FOUR TIMES A DAY. 03/16/21   PLindell Spar MD  famotidine (PEPCID) 40 MG tablet Take 1 tablet (40 mg total) by mouth daily. 09/16/21   PLindell Spar MD  furosemide (LASIX) 20 MG tablet TAKE ONE TABLET BY MOUTH ONCE DAILY. 12/22/21   PLindell Spar MD   levothyroxine (SYNTHROID) 88 MCG tablet Take 1 tablet (88 mcg total) by mouth daily. 07/24/20   DAlycia Rossetti MD  Multiple Vitamin (MULTIVITAMIN WITH MINERALS) TABS tablet Take 1 tablet by mouth daily.     [provider]  Omega-3 Fatty Acids (FISH OIL) 1000 MG CAPS Take 1,000 mg by mouth daily.    [provider]  potassium chloride SA (KLOR-CON M) 20 MEQ tablet TAKE 1 TABLET (20 MEQ) BY MOUTH DAILY PRN- EDEMA 09/16/21   PLindell Spar MD  traMADol (ULTRAM) 50 MG tablet Take 50 mg by mouth every 6 (six) hours as needed for moderate pain.     [provider]      Allergies    Aspirin and Macrobid [nitrofurantoin]    Review of Systems   Review of Systems  Physical Exam Updated Vital Signs BP 114/68   Pulse 81   Temp 98.8 F (37.1 C) (Oral)   Resp 14   SpO2 100%  Physical Exam Vitals and nursing note reviewed.  Constitutional:      General: She is not in acute distress.    Appearance: She is well-developed. She is not ill-appearing or toxic-appearing.  HENT:     Head: Normocephalic and atraumatic.     Right Ear: External ear normal.     Left Ear: External ear normal.  Eyes:     Conjunctiva/sclera: Conjunctivae normal.     Pupils: Pupils are equal, round, and reactive to light.  Neck:     Trachea: Phonation normal.  Cardiovascular:     Rate and Rhythm: Normal rate and regular rhythm.     Heart sounds: Normal heart sounds.  Pulmonary:     Effort: Pulmonary effort is normal.     Breath sounds: Normal breath sounds.  Abdominal:     General: There is no distension.     Palpations: Abdomen is soft.     Tenderness: There is no abdominal tenderness.  Genitourinary:    Comments: Normal anus.  Melanotic stool in rectum. Musculoskeletal:        General: Normal range of motion.     Cervical back: Normal range of motion and neck supple.  Skin:    General: Skin is warm and dry.  Neurological:     Mental Status: She is alert and oriented to  person, place, and time.     Cranial Nerves: No cranial nerve deficit.     Sensory: No sensory deficit.     Motor: No abnormal muscle tone.     Coordination: Coordination normal.  Psychiatric:        Behavior: Behavior normal.        Thought Content: Thought content normal.        Judgment: Judgment normal.     ED Results / Procedures / Treatments   Labs (all labs ordered are listed, but only abnormal results are displayed) Labs Reviewed  CBC WITH DIFFERENTIAL/PLATELET - Abnormal; Notable for the following components:      Result Value   RBC 2.44 (*)    Hemoglobin 7.9 (*)    HCT 25.2 (*)    MCV 103.3 (*)    All other components within normal limits  COMPREHENSIVE METABOLIC PANEL - Abnormal; Notable for the following components:   Glucose, Bld 114 (*)    Albumin 2.7 (*)    Alkaline Phosphatase 35 (*)    All other components within normal limits  POC OCCULT BLOOD, ED - Abnormal; Notable for the following components:   Fecal Occult Bld POSITIVE (*)    All other components within normal limits  LIPASE, BLOOD  URINALYSIS, ROUTINE W REFLEX MICROSCOPIC  OCCULT BLOOD X 1 CARD TO LAB, STOOL  TYPE AND SCREEN  PREPARE RBC (CROSSMATCH)    EKG None  Radiology No results found.  Procedures Procedures    Medications Ordered in ED Medications  0.9 %  sodium chloride infusion (has no administration in time range)  pantoprazole (PROTONIX) 80 mg /NS 100 mL IVPB (has no administration in time range)  pantoprozole (PROTONIX) 80 mg /NS 100 mL infusion (has no administration in time range)  pantoprazole (PROTONIX) injection 40 mg (has no administration in time range)  sodium chloride 0.9 % bolus 500 mL (has no administration in time range)    ED Course/ Medical Decision Making/ A&P                           Medical Decision Making Patient presenting for evaluation of vomiting blood and passing blood.  Stools is melanotic, and BUN is normal.  Unable to specify if this is upper or  lower bleeding at this time.  History of vomiting but however makes it likely to be upper bleeding.  She does not have a history of known varices or esophageal disorder or liver disorder.  Hemodynamically  stable on arrival but hemoglobin is much lower than prior.  Blood transfusion ordered for stabilization purposes.  IV Protonix infusion started.  Problems Addressed: Rectal bleeding: complicated acute illness or injury with systemic symptoms that poses a threat to life or bodily functions    Details: Blood transfusion ordered due to suspected rapid blood loss.  Consultation with gastroenterology regarding current status.  Amount and/or Complexity of Data Reviewed Independent Historian:     Details: She is a cogent historian Labs: ordered.    Details: CBC, metabolic panel, lipase-normal except hemoglobin low, MCV high, glucose high, albumin low, alk phos stays low Discussion of management or test interpretation with external provider(s): Consultation GI-Dr. Fuller Plan will see patient as consultant, likely tomorrow and sooner as needed  Consult hospitalist arrange for admission  Risk Prescription drug management. Decision regarding hospitalization. Elective major surgery with no identified risk factors. Risk Details: Patient with suspected rapid GI bleeding, ordered blood for transfusion 1 unit.  Initially hemoglobin 7.9, much lower than baseline of 11.4, 9 months ago.  Patient is amenable to blood transfusion.  Also ordered IV fluids for stabilization.  Patient may need upper and lower endoscopy for clarification of source of bleeding.  Comorbidities include hypertension, hypothyroidism, GERD, rheumatoid arthritis.  She is not anticoagulated.  She is currently taking famotidine.  She requires hospitalization for observation, stabilization and likely procedural evaluation for GI bleeding.  Critical Care Total time providing critical care: 40 minutes           Final Clinical Impression(s)  / ED Diagnoses Final diagnoses:  Rectal bleeding    Rx / DC Orders ED Discharge Orders     None         Daleen Bo, MD 01/10/22 1810

## 2022-01-10 NOTE — ED Provider Triage Note (Signed)
Emergency Medicine Provider Triage Evaluation Note  Kathy [name] , a 81 y.o. female  was evaluated in triage.  Pt complains of bloody emesis, rectal bleeding this morning.  She did have a similar episode in the past after an ulcer, this was treated approximately 6 years ago by Dr. Earlean Shawl .  She does note that the blood was bright red.  She did have some buttermilk prior to arrival in the ED.  Also reporting generalized abdominal pain.  Review of Systems  Positive: Rectal bleeding, hematemesis, abdominal pain Negative: Fever, chest pain, sob  Physical Exam  BP 102/72 (BP Location: Left Arm)   Pulse 89   Temp 98.8 F (37.1 C) (Oral)   Resp 14   SpO2 100%  Gen:   Awake, no distress   Resp:  Normal effort  MSK:   Moves extremities without difficulty  Other:    Medical Decision Making  Medically screening exam initiated at 2:06 PM.  Appropriate orders placed.  Tyjanae Bartek Streeper was informed that the remainder of the evaluation will be completed by another provider, this initial triage assessment does not replace that evaluation, and the importance of remaining in the ED until their evaluation is complete.     Janeece Fitting, PA-C 01/10/22 1410

## 2022-01-10 NOTE — Assessment & Plan Note (Signed)
Hold antihypertensives with BP on the low side.

## 2022-01-10 NOTE — Assessment & Plan Note (Signed)
Follows with rheumatology, on Humira.  Continue home tramadol as needed.

## 2022-01-10 NOTE — Assessment & Plan Note (Signed)
Presenting with single episode of bloody emesis followed by [facility] with transient epigastric pain.  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  Hemoglobin 7.9 compared to prior of 11.4 in August 2022. -She is being transfused 1 unit PRBC in the ED -Started on IV Protonix, continue -Allow clear liquids now, keep n.p.o. after midnight -GI to see in a.m.

## 2022-01-10 NOTE — Assessment & Plan Note (Signed)
Continue home Xanax 0.25 mg daily as needed. 

## 2022-01-10 NOTE — Hospital Course (Signed)
Kathy [name] is a 81 y.o. 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

## 2022-01-10 NOTE — Assessment & Plan Note (Signed)
Continue Synthroid °

## 2022-01-11 ENCOUNTER — Observation Stay (HOSPITAL_BASED_OUTPATIENT_CLINIC_OR_DEPARTMENT_OTHER): Payer: Medicare Other | Admitting: Anesthesiology

## 2022-01-11 ENCOUNTER — Encounter (HOSPITAL_COMMUNITY): Payer: Self-pay | Admitting: Internal Medicine

## 2022-01-11 ENCOUNTER — Encounter (HOSPITAL_COMMUNITY)
Admission: EM | Disposition: A | Discharge status: Home / Self Care | Payer: Self-pay | Source: Home / Self Care | Attending: Internal Medicine

## 2022-01-11 ENCOUNTER — Observation Stay (HOSPITAL_COMMUNITY): Payer: Medicare Other | Admitting: Anesthesiology

## 2022-01-11 DIAGNOSIS — I10 Essential (primary) hypertension: Secondary | ICD-10-CM

## 2022-01-11 DIAGNOSIS — Z9884 Bariatric surgery status: Secondary | ICD-10-CM | POA: Diagnosis not present

## 2022-01-11 DIAGNOSIS — E039 Hypothyroidism, unspecified: Secondary | ICD-10-CM | POA: Diagnosis not present

## 2022-01-11 DIAGNOSIS — E785 Hyperlipidemia, unspecified: Secondary | ICD-10-CM | POA: Diagnosis present

## 2022-01-11 DIAGNOSIS — Z96652 Presence of left artificial knee joint: Secondary | ICD-10-CM | POA: Diagnosis present

## 2022-01-11 DIAGNOSIS — K254 Chronic or unspecified gastric ulcer with hemorrhage: Secondary | ICD-10-CM

## 2022-01-11 DIAGNOSIS — K289 Gastrojejunal ulcer, unspecified as acute or chronic, without hemorrhage or perforation: Secondary | ICD-10-CM | POA: Diagnosis not present

## 2022-01-11 DIAGNOSIS — Z79890 Hormone replacement therapy (postmenopausal): Secondary | ICD-10-CM | POA: Diagnosis not present

## 2022-01-11 DIAGNOSIS — K2100 Gastro-esophageal reflux disease with esophagitis, without bleeding: Secondary | ICD-10-CM | POA: Diagnosis present

## 2022-01-11 DIAGNOSIS — F419 Anxiety disorder, unspecified: Secondary | ICD-10-CM | POA: Diagnosis present

## 2022-01-11 DIAGNOSIS — M810 Age-related osteoporosis without current pathological fracture: Secondary | ICD-10-CM | POA: Diagnosis present

## 2022-01-11 DIAGNOSIS — Z96612 Presence of left artificial shoulder joint: Secondary | ICD-10-CM | POA: Diagnosis present

## 2022-01-11 DIAGNOSIS — Z96642 Presence of left artificial hip joint: Secondary | ICD-10-CM | POA: Diagnosis present

## 2022-01-11 DIAGNOSIS — K625 Hemorrhage of anus and rectum: Secondary | ICD-10-CM | POA: Diagnosis present

## 2022-01-11 DIAGNOSIS — K922 Gastrointestinal hemorrhage, unspecified: Secondary | ICD-10-CM | POA: Diagnosis not present

## 2022-01-11 DIAGNOSIS — I959 Hypotension, unspecified: Secondary | ICD-10-CM | POA: Diagnosis present

## 2022-01-11 DIAGNOSIS — Z85850 Personal history of malignant neoplasm of thyroid: Secondary | ICD-10-CM | POA: Diagnosis not present

## 2022-01-11 DIAGNOSIS — D62 Acute posthemorrhagic anemia: Secondary | ICD-10-CM | POA: Diagnosis not present

## 2022-01-11 DIAGNOSIS — Q438 Other specified congenital malformations of intestine: Secondary | ICD-10-CM | POA: Diagnosis not present

## 2022-01-11 DIAGNOSIS — E890 Postprocedural hypothyroidism: Secondary | ICD-10-CM | POA: Diagnosis present

## 2022-01-11 DIAGNOSIS — Z7983 Long term (current) use of bisphosphonates: Secondary | ICD-10-CM | POA: Diagnosis not present

## 2022-01-11 DIAGNOSIS — M069 Rheumatoid arthritis, unspecified: Secondary | ICD-10-CM | POA: Diagnosis present

## 2022-01-11 DIAGNOSIS — D696 Thrombocytopenia, unspecified: Secondary | ICD-10-CM | POA: Diagnosis present

## 2022-01-11 DIAGNOSIS — Z79899 Other long term (current) drug therapy: Secondary | ICD-10-CM | POA: Diagnosis not present

## 2022-01-11 DIAGNOSIS — K284 Chronic or unspecified gastrojejunal ulcer with hemorrhage: Secondary | ICD-10-CM | POA: Diagnosis not present

## 2022-01-11 DIAGNOSIS — M1712 Unilateral primary osteoarthritis, left knee: Secondary | ICD-10-CM | POA: Diagnosis present

## 2022-01-11 DIAGNOSIS — Z96611 Presence of right artificial shoulder joint: Secondary | ICD-10-CM | POA: Diagnosis present

## 2022-01-11 HISTORY — PX: HOT HEMOSTASIS: SHX5433

## 2022-01-11 HISTORY — PX: SCLEROTHERAPY: SHX6841

## 2022-01-11 HISTORY — PX: ESOPHAGOGASTRODUODENOSCOPY (EGD) WITH PROPOFOL: SHX5813

## 2022-01-11 LAB — BASIC METABOLIC PANEL
Anion gap: 6 (ref 5–15)
BUN: 15 mg/dL (ref 8–23)
CO2: 23 mmol/L (ref 22–32)
Calcium: 8.5 mg/dL — ABNORMAL LOW (ref 8.9–10.3)
Chloride: 110 mmol/L (ref 98–111)
Creatinine, Ser: 0.68 mg/dL (ref 0.44–1.00)
GFR, Estimated: >60 mL/min (ref >60)
Glucose, Bld: 92 mg/dL (ref 70–99)
Potassium: 4.2 mmol/L (ref 3.5–5.1)
Sodium: 139 mmol/L (ref 135–145)

## 2022-01-11 LAB — PROTIME-INR
INR: 1.1 (ref 0.8–1.2)
Prothrombin Time: 14.3 seconds (ref 11.4–15.2)

## 2022-01-11 LAB — CBC
HCT: 21.9 % — ABNORMAL LOW (ref 36.0–46.0)
Hemoglobin: 7.1 g/dL — ABNORMAL LOW (ref 12.0–15.0)
MCH: 30.7 pg (ref 26.0–34.0)
MCHC: 32.4 g/dL (ref 30.0–36.0)
MCV: 94.8 fL (ref 80.0–100.0)
Platelets: 135 10*3/uL — ABNORMAL LOW (ref 150–400)
RBC: 2.31 MIL/uL — ABNORMAL LOW (ref 3.87–5.11)
RDW: 17.7 % — ABNORMAL HIGH (ref 11.5–15.5)
WBC: 7.1 10*3/uL (ref 4.0–10.5)
nRBC: 0.3 % — ABNORMAL HIGH (ref 0.0–0.2)

## 2022-01-11 LAB — HEMOGLOBIN AND HEMATOCRIT, BLOOD
HCT: 24.7 % — ABNORMAL LOW (ref 36.0–46.0)
HCT: 23.1 % — ABNORMAL LOW (ref 36.0–46.0)
Hemoglobin: 8.2 g/dL — ABNORMAL LOW (ref 12.0–15.0)
Hemoglobin: 8 g/dL — ABNORMAL LOW (ref 12.0–15.0)

## 2022-01-11 LAB — OCCULT BLOOD X 1 CARD TO LAB, STOOL: Fecal Occult Bld: POSITIVE — AB

## 2022-01-11 LAB — FOLATE: Folate: 23.6 ng/mL (ref >5.9)

## 2022-01-11 SURGERY — ESOPHAGOGASTRODUODENOSCOPY (EGD) WITH PROPOFOL
Anesthesia: Monitor Anesthesia Care

## 2022-01-11 MED ORDER — SODIUM CHLORIDE (PF) 0.9 % IJ SOLN
PREFILLED_SYRINGE | INTRAMUSCULAR | Status: DC | PRN
  Administered 2022-01-11: 3 mL

## 2022-01-11 MED ORDER — LACTATED RINGERS IV SOLN: INTRAVENOUS | Status: DC

## 2022-01-11 MED ORDER — PROPOFOL 500 MG/50ML IV EMUL
INTRAVENOUS | Status: DC | PRN
  Administered 2022-01-11: 125 ug/kg/min via INTRAVENOUS

## 2022-01-11 MED ORDER — LIDOCAINE 2% (20 MG/ML) 5 ML SYRINGE
INTRAMUSCULAR | Status: DC | PRN
  Administered 2022-01-11: 60 mg via INTRAVENOUS

## 2022-01-11 MED ORDER — LACTATED RINGERS IV SOLN
INTRAVENOUS | Status: AC | PRN
  Administered 2022-01-11: 20 mL/h via INTRAVENOUS

## 2022-01-11 MED ORDER — SUCRALFATE 1 GM/10ML PO SUSP
1.0000 g | Freq: Three times a day (TID) | ORAL | Status: DC
  Administered 2022-01-11 – 2022-01-14 (×11): 1 g via ORAL
  Filled 2022-01-11 (×11): qty 10

## 2022-01-11 MED ORDER — PROPOFOL 10 MG/ML IV BOLUS
INTRAVENOUS | Status: DC | PRN
  Administered 2022-01-11: 20 mg via INTRAVENOUS
  Administered 2022-01-11: 30 mg via INTRAVENOUS

## 2022-01-11 MED ORDER — FENTANYL CITRATE (PF) 100 MCG/2ML IJ SOLN
INTRAMUSCULAR | Status: AC
  Filled 2022-01-11: qty 2

## 2022-01-11 SURGICAL SUPPLY — 15 items
BLOCK BITE 60FR ADLT L/F BLUE (MISCELLANEOUS) ×4 IMPLANT
ELECT REM PT RETURN 9FT ADLT (ELECTROSURGICAL) IMPLANT
ELECTRODE REM PT RTRN 9FT ADLT (ELECTROSURGICAL) IMPLANT
FORCEP RJ3 GP 1.8X160 W-NEEDLE (CUTTING FORCEPS) IMPLANT
FORCEPS BIOP RAD 4 LRG CAP 4 (CUTTING FORCEPS) IMPLANT
NDL SCLEROTHERAPY 25GX240 (NEEDLE) IMPLANT
NEEDLE SCLEROTHERAPY 25GX240 (NEEDLE) IMPLANT
PROBE APC STR FIRE (PROBE) IMPLANT
PROBE INJECTION GOLD (MISCELLANEOUS)
PROBE INJECTION GOLD 7FR (MISCELLANEOUS) IMPLANT
SNARE SHORT THROW 13M SML OVAL (MISCELLANEOUS) IMPLANT
SYR 50ML LL SCALE MARK (SYRINGE) IMPLANT
TUBING ENDO SMARTCAP PENTAX (MISCELLANEOUS) ×8 IMPLANT
TUBING IRRIGATION ENDOGATOR (MISCELLANEOUS) ×4 IMPLANT
WATER STERILE IRR 1000ML POUR (IV SOLUTION) IMPLANT

## 2022-01-11 NOTE — Anesthesia Preprocedure Evaluation (Addendum)
Anesthesia Evaluation  Patient identified by MRN, date of birth, ID band Patient awake    Reviewed: Allergy & Precautions, NPO status , Patient's Chart, lab work & pertinent test results  History of Anesthesia Complications (+) PONV and history of anesthetic complications  Airway Mallampati: II  TM Distance: >3 FB Neck ROM: Full    Dental  (+) Teeth Intact, Dental Advisory Given   Pulmonary neg pulmonary ROS,    Pulmonary exam normal        Cardiovascular hypertension, Pt. on medications negative cardio ROS Normal cardiovascular exam     Neuro/Psych Anxiety negative neurological ROS     GI/Hepatic Neg liver ROS, PUD, GERD  ,  Endo/Other  Hypothyroidism   Renal/GU negative Renal ROS     Musculoskeletal  (+) Arthritis , Rheumatoid disorders,    Abdominal   Peds  Hematology negative hematology ROS (+)   Anesthesia Other Findings   Reproductive/Obstetrics                            Anesthesia Physical Anesthesia Plan  ASA: 3  Anesthesia Plan: MAC   Post-op Pain Management: Minimal or no pain anticipated   Induction:   PONV Risk Score and Plan: 3 and TIVA, Propofol infusion and Treatment may vary due to age or medical condition  Airway Management Planned: Natural Airway  Additional Equipment:   Intra-op Plan:   Post-operative Plan:   Informed Consent:   Plan Discussed with: Anesthesiologist  Anesthesia Plan Comments:         Anesthesia Quick Evaluation

## 2022-01-11 NOTE — Consult Note (Signed)
[facility] Consult: 9:41 AM 01/11/2022  LOS: 0 days    Referring Provider: Dr Raelyn Mora.    Primary Care Physician:  Lindell Spar, MD Primary Gastroenterologist:  Dr. Richmond Campbell.      Reason for Consultation: GI bleed with hematemesis, melena.  Anemia.   HPI: Kathy [name] is a 81 y.o. female.  PMH listed below.  Status post bariatric Roux-en-Y 2011, partial hysterectomy, cholecystectomy.  Charting mentions hiatal hernia repair but patient denies this.  Weekly Humira for management rheumatoid arthritis.  2018 bleeding ulcer with associated transfusion requiring blood loss anemia.  Osteoporosis on Prolia injection.  Status post multiple joint surgeries.  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  Morbid obesity. GI issues include the Roux-en-Y procedure with history of anastomotic ulcer. 2018 EGD with anastomotic ulcer at Roux-en-Y bypass.  This was in setting of aspirin use.  No dysplasia or H. pylori on biopsy.  Treated with PPI, Carafate. 2010 colonoscopy.  Mild diverticulosis.  Redundant colon.  Study completed to the cecum and TI.  Excellent prep.  Presented yesterday afternoon to the ED. around 2 AM she had had onset of moderate to large volume hematemesis and hematochezia.  No repeat hematemesis but had about 4 or 5 episodes of hematochezia, the last episode was 2 or 3 AM today.  Initially some epigastric pain which resolved.  Not using aspirin.  Takes Celebrex daily.  Starting late last week, experiencing anorexia which is unusual for her.  Ulcer prophylaxis consists of famotidine 40 mg/day.    BPs hypotensive in the low 90s -low 100s/50s-60s.  Heart rate in the 70s-80s.  Excellent room air saturations. No further abdominal pain.  Hgb 7.9.Marland Kitchen  1 PRBC ...7.1 this AM.  Previous  baseline 11.4 ten months ago.   Iron/anemia studies unremarkable.  Iron is 64, ferritin is 18. Platelets 175 .Marland Kitchen 135 at arrival, previously normal. FOBT positive. BUN normal. LFTs with low albumin 2.7 and low alk phos 35 but otherwise normal.  She received IV fluids, initiated IV Protonix and 1 PRBC.  Family history includes lung cancer in her father.  Diabetes and CHF in her mother.  CAD, CABG in her brother.  "Thrombosis" in sister. Divorced.  No children but has strong support network of family and friends.  Retired Psychologist, counselling, worked at [employer].  Past Medical History:  Diagnosis Date   Abnormal findings on diagnostic imaging of heart and coronary circulation    Anemia    FROM BLEEDING ULCER   Anxiety    takes Alprazolam daily as needed   Arthritis    dx with RA 2017   Bariatric surgery status    Cataract    Diverticulosis    Gastro-esophageal reflux disease with esophagitis    Headache(784.0)    occasionally   History of blood transfusion    no abnormal reaction noted   History of bronchitis    last time many yrs ago   Hyperlipidemia    PT DENIES THIS DX -  ON NO MEDS AND NO ONE HAS  TOLD HER   Hypertension    takes Amlodipine daily   Hypothyroidism    takes Synthroid daily   Insomnia    Joint pain    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   Localized edema    Nocturia    Numbness    occasionally left arm at night   Obesity    Osteoporosis    takes Fosamax weekly   Pain in joint involving pelvic region and thigh    Peripheral edema    takes Lasix daily as needed   PONV (postoperative nausea and vomiting)    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/2017   Rheumatoid arthritis, unspecified ([place])    Rotator cuff arthropathy, 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   Sleep apnea    Phreesia 08/22/2020   Stomach ulcer    Thyroid disease    Phreesia 08/22/2020   Unspecified injury of muscle(s) and tendon(s) of the rotator cuff of left shoulder,  subsequent encounter    Unspecified osteoarthritis, unspecified site    Wears glasses    Wears partial dentures     Past Surgical History:  Procedure Laterality Date   ABDOMINAL HYSTERECTOMY     partial   cataract surgery Bilateral    CHOLECYSTECTOMY     COLONOSCOPY     EYE SURGERY     CATARACTS BOTH   gastric bypass surgery     JOINT REPLACEMENT Bilateral    hip   REVERSE SHOULDER ARTHROPLASTY Right 05/11/2016   Procedure: REVERSE SHOULDER ARTHROPLASTY;  Surgeon: Marchia Bond, MD;  Location: [place];  Service: Orthopedics;  Laterality: Right;   REVISION TOTAL HIP ARTHROPLASTY Left 10/03/2013   DR Mayer Camel   ROTATOR CUFF REPAIR     TOTAL HIP REVISION Left 10/03/2013   Procedure: TOTAL HIP REVISION- left;  Surgeon: Kerin Salen, MD;  Location: [place];  Service: Orthopedics;  Laterality: Left;   TOTAL KNEE ARTHROPLASTY Left 11/29/2017   Procedure: LEFT TOTAL KNEE ARTHROPLASTY;  Surgeon: Marchia Bond, MD;  Location: [place];  Service: Orthopedics;  Laterality: Left;   TOTAL SHOULDER ARTHROPLASTY Left 11/09/2016   Procedure: TOTAL REVERSE SHOULDER ARTHROPLASTY;  Surgeon: Marchia Bond, MD;  Location: [place];  Service: Orthopedics;  Laterality: Left;   TOTAL SHOULDER REPLACEMENT Left 10/2016   TOTAL THYROIDECTOMY      Prior to Admission medications   Medication Sig Start Date End Date Taking? Authorizing Provider  Adalimumab (HUMIRA) 40 MG/0.4ML PSKT Injection every 2 weeks 04/16/20   Alycia Rossetti, MD  ALPRAZolam Duanne Moron) 0.25 MG tablet Take 0.25 mg by mouth at bedtime as needed for anxiety or sleep.    [provider]  amLODipine (NORVASC) 5 MG tablet TAKE (1) TABLET BY MOUTH ONCE DAILY. 03/16/21   Lindell Spar, MD  calcium carbonate (OSCAL) 1500 (600 Ca) MG TABS tablet Take 300-600 mg by mouth 3 (three) times daily. Take 1 tablet (600 mg) in the morning, 0.5 tablet (300 mg) at noon, & 1 tablet (600 mg) in the evening.    [provider]  celecoxib (CELEBREX) 100 MG  capsule TAKE ONE CAPSULE BY MOUTH ONCE DAILY. 08/07/21   Lindell Spar, MD  Cholecalciferol 1000 UNITS TBDP Take 1,000 Units by mouth 2 (two) times daily.     [provider]  denosumab (PROLIA) 60 MG/ML SOSY injection 28m Subcutaneous every 6 months 180 days 10/15/21     diclofenac Sodium (VOLTAREN) 1 % GEL APPLY 2 GRAMS TO AFFECTED AREA FOUR TIMES A DAY. 03/16/21  Lindell Spar, MD  famotidine (PEPCID) 40 MG tablet Take 1 tablet (40 mg total) by mouth daily. 09/16/21   Lindell Spar, MD  furosemide (LASIX) 20 MG tablet TAKE ONE TABLET BY MOUTH ONCE DAILY. 12/22/21   Lindell Spar, MD  levothyroxine (SYNTHROID) 88 MCG tablet Take 1 tablet (88 mcg total) by mouth daily. 07/24/20   Alycia Rossetti, MD  Multiple Vitamin (MULTIVITAMIN WITH MINERALS) TABS tablet Take 1 tablet by mouth daily.     [provider]  Omega-3 Fatty Acids (FISH OIL) 1000 MG CAPS Take 1,000 mg by mouth daily.    [provider]  potassium chloride SA (KLOR-CON M) 20 MEQ tablet TAKE 1 TABLET (20 MEQ) BY MOUTH DAILY PRN- EDEMA 09/16/21   Lindell Spar, MD  traMADol (ULTRAM) 50 MG tablet Take 50 mg by mouth every 6 (six) hours as needed for moderate pain.     [provider]    Scheduled Meds:  levothyroxine  88 mcg Oral Daily   [START ON 01/14/2022] pantoprazole  40 mg Intravenous Q12H   Infusions:  pantoprazole 8 mg/hr (01/11/22 0859)   PRN Meds: acetaminophen **OR** acetaminophen, ALPRAZolam, ondansetron **OR** ondansetron (ZOFRAN) IV, traMADol   Allergies as of 01/10/2022 - Review Complete 01/10/2022  Allergen Reaction Noted   Aspirin Nausea And Vomiting and Other (See Comments) 05/04/2016   Macrobid [nitrofurantoin]  07/28/2019    Family History  Problem Relation Age of Onset   Diabetes Mother    Congestive Heart Failure Mother    Lung cancer Father    Thrombosis Sister    CAD Brother        CABG    Social History   Socioeconomic History   Marital status:  Divorced    Spouse name: Not on file   Number of children: Not on file   Years of education: Not on file   Highest education level: Not on file  Occupational History   Occupation: disabled  Tobacco Use   Smoking status: Never   Smokeless tobacco: Never  Vaping Use   Vaping Use: Never used  Substance and Sexual Activity   Alcohol use: No   Drug use: No   Sexual activity: Not Currently    Birth control/protection: Surgical  Other Topics Concern   Not on file  Social History Narrative   Not on file   Social Determinants of Health   Financial Resource Strain: Low Risk  (09/14/2021)   Overall Financial Resource Strain (CARDIA)    Difficulty of Paying Living Expenses: Not hard at all  Food Insecurity: No Food Insecurity (09/14/2021)   Hunger Vital Sign    Worried About Running Out of Food in the Last Year: Never true    Ran Out of Food in the Last Year: Never true  Transportation Needs: No Transportation Needs (09/14/2021)   PRAPARE - Hydrologist (Medical): No    Lack of Transportation (Non-Medical): No  Physical Activity: Sufficiently Active (09/14/2021)   Exercise Vital Sign    Days of Exercise per Week: 6 days    Minutes of Exercise per Session: 40 min  Stress: No Stress Concern Present (09/14/2021)   [place]    Feeling of Stress : Not at all  Social Connections: Moderately Isolated (09/14/2021)   Social Connection and Isolation Panel [NHANES]    Frequency of Communication with Friends and Family: More than three times a week  Frequency of Social Gatherings with Friends and Family: More than three times a week    Attends Religious Services: More than 4 times per year    Active Member of [employer] or Organizations: No    Attends Archivist Meetings: Never    Marital Status: Divorced  Human resources officer Violence: Not At Risk (09/14/2021)   Humiliation, Afraid, Rape, and Kick  questionnaire    Fear of Current or Ex-Partner: No    Emotionally Abused: No    Physically Abused: No    Sexually Abused: No    REVIEW OF SYSTEMS: Constitutional: Yesterday was feeling weak but this has resolved this morning. ENT:  No nose bleeds Pulm: No shortness of breath or cough. CV:  No palpitations, no LE edema.  Angina. GU:  No hematuria, no frequency GI: See HPI. Heme: Besides the rectal bleeding, no other unusual or excessive bleeding or bruising. Transfusions: 1 unit blood overnight as per HPI. Neuro:  No headaches, no peripheral tingling or numbness.  Felt dizziness and presyncope yesterday but this has resolved. Derm:  No itching, no rash or sores.  Endocrine:  No sweats or chills.  No polyuria or dysuria Immunization: Reviewed extensive list of up-to-date immunizations. Travel:  not queried   PHYSICAL EXAM: Vital signs in last 24 hours: Vitals:   01/11/22 0525 01/11/22 0929  BP: (!) 102/54 102/63  Pulse: 75 71  Resp: 16 16  Temp: 98.5 F (36.9 C)   SpO2: 98% 100%   Wt Readings from Last 3 Encounters:  01/10/22 77.1 kg  09/16/21 76.7 kg  06/17/21 77.7 kg    General: Patient looks well though she is pale.  She is alert, comfortable and able to provide good history.  She looks 69 years younger than stated age. Head: No facial asymmetry or swelling.  No signs of head trauma. Eyes: + conjunctival pallor.  EOMI. Ears: No hearing deficit Nose: No congestion or discharge Mouth: Excellent dentition.  Oral mucosa is moist, pink, clear.  Tongue midline. Neck: No thyromegaly or masses Lungs: No adventitious sounds, lungs clear bilaterally.  Unlabored breathing.  No cough. Heart: RRR.  No MRG.  S1, S2 present Abdomen: Soft without tenderness.  No HSM, masses, bruits, hernias..   Rectal: Deferred Musc/Skeltl: No joint redness, swelling or gross deformity. Extremities: No CCE. Neurologic: Oriented x3.  Moves all 4 limbs without gross weakness though formal strength  testing not performed.  No tremors or gross deficits. Skin: No telangiectasia, suspicious lesions or rash. Nodes: No cervical adenopathy Psych: Calm, pleasant, cooperative.  Intake/Output from previous day: 06/11 0701 - 06/12 0700 In: 1533.5 [P.O.:480; I.V.:49.1; Blood:387.7; IV Piggyback:616.7] Out: -  Intake/Output this shift: No intake/output data recorded.  LAB RESULTS: Recent Labs    01/10/22 1411 01/11/22 0501  WBC 8.2 7.1  HGB 7.9* 7.1*  HCT 25.2* 21.9*  PLT 175 135*   BMET Lab Results  Component Value Date   NA 139 01/11/2022   NA 137 01/10/2022   NA 137 03/18/2021   K 4.2 01/11/2022   K 4.9 01/10/2022   K 4.1 03/18/2021   CL 110 01/11/2022   CL 107 01/10/2022   CL 98 03/18/2021   CO2 23 01/11/2022   CO2 23 01/10/2022   CO2 27 03/18/2021   GLUCOSE 92 01/11/2022   GLUCOSE 114 (H) 01/10/2022   GLUCOSE 81 03/18/2021   BUN 15 01/11/2022   BUN 19 01/10/2022   BUN 11 03/18/2021   CREATININE 0.68 01/11/2022   CREATININE 0.83  01/10/2022   CREATININE 0.64 03/18/2021   CALCIUM 8.5 (L) 01/11/2022   CALCIUM 9.4 01/10/2022   CALCIUM 9.1 03/18/2021   LFT Recent Labs    01/10/22 1411  PROT 7.0  ALBUMIN 2.7*  AST 23  ALT 14  ALKPHOS 35*  BILITOT 0.9   PT/INR Lab Results  Component Value Date   INR 0.96 09/26/2013   Hepatitis Panel No results for input(s): "HEPBSAG", "HCVAB", "HEPAIGM", "HEPBIGM" in the last 72 hours. C-Diff No components found for: "CDIFF" Lipase     Component Value Date/Time   LIPASE 23 01/10/2022 1411    Drugs of Abuse  No results found for: "LABOPIA", "COCAINSCRNUR", "LABBENZ", "AMPHETMU", "THCU", "LABBARB"   RADIOLOGY STUDIES: No results found.    IMPRESSION:   GI bleed with single episode of hematemesis and 4 5 episodes of hematochezia.  Initially with epigastric pain and prodrome of anorexia.  Epigastric pain has resolved.  Anastomotic ulcers at Roux-en-Y bypass site in 2018.  Ulcer prophylaxis consists of famotidine  40 mg/day.      RA, on weekly Humira, Celebrex daily.  S/p multiple joint surgeries.    Blood loss anemia.  S/p 1 PRBC but Hb has dropped close to 1 g despite transfusion.  Acute thrombocytopenia.  Bariatric Roux-en-Y bypass surgery 2011.  Mild diverticulosis, redundant colon on 2010 colonoscopy.    PLAN:       EGD today.  Discussed with patient and she is agreeable to proceed.   Azucena Freed  01/11/2022, 9:41 AM Phone [phone]

## 2022-01-11 NOTE — Transfer of Care (Signed)
Immediate Anesthesia Transfer of Care Note  Patient: Kathy [name]  Procedure(s) Performed: ESOPHAGOGASTRODUODENOSCOPY (EGD) WITH PROPOFOL SCLEROTHERAPY HOT HEMOSTASIS (ARGON PLASMA COAGULATION/BICAP)  Patient Location: Endoscopy Unit  Anesthesia Type:MAC  Level of Consciousness: awake, oriented and patient cooperative  Airway & Oxygen Therapy: Patient Spontanous Breathing and Patient connected to nasal cannula oxygen  Post-op Assessment: Report given to RN and Post -op Vital signs reviewed and stable  Post vital signs: Reviewed  Last Vitals:  Vitals Value Taken Time  BP 131/24 01/11/22 1221  Temp    Pulse 74 01/11/22 1222  Resp 23 01/11/22 1222  SpO2 100 % 01/11/22 1222  Vitals shown include unvalidated device data.  Last Pain:  Vitals:   01/11/22 1221  TempSrc:   PainSc: 0-No pain         Complications: No notable events documented.

## 2022-01-11 NOTE — Op Note (Signed)
[facility] Patient Name: Kathy [name] Procedure Date : 01/11/2022 MRN: [id number] Attending MD: Gatha Mayer , MD Date of Birth: Sep 27, 1940 CSN: [id number] Age: 81 Admit Type: Inpatient Procedure:                Upper GI endoscopy Indications:              Hematemesis, Hematochezia, Suspected upper                            gastrointestinal bleeding Providers:                Gatha Mayer, MD, Jaci Carrel, RN, Gloris Ham, Technician Referring MD:              Medicines:                Monitored Anesthesia Care Complications:            No immediate complications. Estimated Blood Loss:     Estimated blood loss was minimal. Procedure:                Pre-Anesthesia Assessment:                           - Prior to the procedure, a History and Physical                            was performed, and patient medications and                            allergies were reviewed. The patient's tolerance of                            previous anesthesia was also reviewed. The risks                            and benefits of the procedure and the sedation                            options and risks were discussed with the patient.                            All questions were answered, and informed consent                            was obtained. Prior Anticoagulants: The patient has                            taken no previous anticoagulant or antiplatelet                            agents. ASA Grade Assessment: III - A patient with  severe systemic disease. After reviewing the risks                            and benefits, the patient was deemed in                            satisfactory condition to undergo the procedure.                           After obtaining informed consent, the endoscope was                            passed under direct vision. Throughout the                            procedure, the  patient's blood pressure, pulse, and                            oxygen saturations were monitored continuously. The                            [id number]-H190 ([id number] Olympus endoscope was introduced                            through the mouth, and advanced to the second part                            of duodenum. The upper GI endoscopy was                            accomplished without difficulty. The patient                            tolerated the procedure well. Scope In: Scope Out: Findings:      One non-bleeding cratered gastric ulcer with a visible vessel was found       at the anastomosis. The lesion was 12 mm in largest dimension. Area was       successfully injected with 4 mL of a 1:10,000 solution of epinephrine       for hemostasis. Estimated blood loss was minimal. Coagulation for       hemostasis using bipolar probe was successful. Estimated blood loss:       none.      Evidence of a gastric bypass was found. A gastric pouch was found. The       gastrojejunal anastomosis was characterized by ulceration as above.       Estimated blood loss was minimal.      The exam was otherwise without abnormality. Impression:               - Non-bleeding gastric ulcer with a visible vessel.                            Injected. Treated with bipolar cautery.                           -  Gastric bypass. Gastrojejunal anastomosis                            characterized by ulceration - as above                           - The examination was otherwise normal.                           - No specimens collected. Recommendation:           - Return patient to hospital ward for ongoing care.                           - Continue PPI infusion                           Clear liquids                           Am checking INR later today and H pylori serology                           We will follow. She has been a patient of Dr.                            Earlean Shawl - f/u w/ [facility] - will need  to                            sort out before dc                           She said only took 2 doses of celebrex so ? if                            culprit                           Pepcid not adequate Tx                           Oral PPI will need to be one that is a capsule and                            can be opened so NOT PANTOPRAZOLE (only a tablet                            and inadequate absorption s/p bypass)                           Will need bid oral PPI at dc                           I called brother Jenny Reichmann and updated perp patient  request Procedure Code(s):        --- Professional ---                           [phone], Esophagogastroduodenoscopy, flexible,                            transoral; with control of bleeding, any method Diagnosis Code(s):        --- Professional ---                           K25.4, Chronic or unspecified gastric ulcer with                            hemorrhage                           K28.9, Gastrojejunal ulcer, unspecified as acute or                            chronic, without hemorrhage or perforation                           K92.0, Hematemesis                           K92.1, Melena (includes Hematochezia) CPT copyright 2019 American Medical Association. All rights reserved. The codes documented in this report are preliminary and upon coder review may  be revised to meet current compliance requirements. Gatha Mayer, MD 01/11/2022 12:28:47 PM This report has been signed electronically. Number of Addenda: 0

## 2022-01-11 NOTE — Progress Notes (Signed)
NEW ADMISSION NOTE   Arrival Method: ED stretcher Mental Orientation: AAOx4 Telemetry: N/A Assessment: Completed Skin: See flowsheet IV: RUE, RLE Pain: 0/10 Tubes: n/a Safety Measures: Safety Fall Prevention Plan has been given, discussed Admission: Completed [facility] Orientation: Patient has been orientated to the room, unit and staff.  Family: none at bedside   Orders have been reviewed and implemented. Will continue to monitor the patient. Call light has been placed within reach and bed alarm has been activated.

## 2022-01-11 NOTE — Progress Notes (Signed)
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 Not on any NSAIDs.  Patient on Pepcid for GI protection.  Admitted with acute upper GI bleeding.   Assessment & Plan:   Acute GI bleeding suspect anastomotic ulcer Anemia of acute blood loss:  Currently hemodynamically stable. Last morning hemoglobin more than 10, presentation hemoglobin 7.9-7.1-1 unit of PRBC transfusion, recheck hemoglobin and continue to monitor every 12 hours.  Transfuse for less than 8. Currently remains on Protonix infusion after loading, continue.  NPO.  IV fluids. GI on board, anticipate upper GI endoscopy today.  Essential hypertension: Holding antihypertensives with risk of blood loss and hypotension.  Chronic anxiety: On Xanax that is continued.  Hypothyroidism: On Synthroid.  Continued.  Rheumatoid arthritis: Follows with rheumatology.  She is on Humira.  Tramadol as needed.   DVT prophylaxis: SCDs Start: 01/10/22 1831   Code Status: Full code Family Communication: None at the bedside Disposition Plan: Status is: Observation The patient will require care spanning > 2 midnights and should be moved to inpatient because: Significant upper GI bleeding requiring blood transfusions and inpatient procedures.     Consultants:  Gastroenterology  Procedures:  None  Antimicrobials:  None   Subjective: Patient seen and examined.  Denies any nausea vomiting or bleeding since admission.  Denies any abdominal pain.  Received 1 unit of PRBC overnight.  Repeat hemoglobin pending.  Objective: Vitals:   01/11/22 0057 01/11/22 0259 01/11/22 0525 01/11/22 0929  BP: (!)  93/52 105/61 (!) 102/54 102/63  Pulse: 79 77 75 71  Resp: '16 18 16 16  '$ Temp: 98.3 F (36.8 C)  98.5 F (36.9 C)   TempSrc: Oral  Oral   SpO2: 99% 96% 98% 100%  Weight:      Height:        Intake/Output Summary (Last 24 hours) at 01/11/2022 1015 Last data filed at 01/11/2022 0259 Gross per 24 hour  Intake 1533.46 ml  Output --  Net 1533.46 ml   Filed Weights   01/10/22 2133  Weight: 77.1 kg    Examination:  General exam: Appears calm and comfortable  Respiratory system: No added sounds. Cardiovascular system: S1 & S2 heard, RRR. No pedal edema. Gastrointestinal system: Soft.  Nontender.  Bowel sound present.   Central nervous system: Alert and oriented. No focal neurological deficits. Extremities: Symmetric 5 x 5 power. Skin: No rashes, lesions or ulcers Psychiatry: Judgement and insight appear normal. Mood & affect appropriate.     Data Reviewed: I have personally reviewed following labs and imaging studies  CBC: Recent Labs  Lab 01/10/22 1411 01/11/22 0501  WBC 8.2 7.1  NEUTROABS 5.3  --   HGB 7.9* 7.1*  HCT 25.2* 21.9*  MCV 103.3* 94.8  PLT 175 510*   Basic Metabolic Panel: Recent Labs  Lab 01/10/22 1411 01/11/22 0501  NA 137 139  K 4.9 4.2  CL 107 110  CO2 23 23  GLUCOSE 114* 92  BUN 19 15  CREATININE 0.83 0.68  CALCIUM 9.4 8.5*   GFR: Estimated Creatinine Clearance: 53.6 mL/min (by C-G formula based on SCr of 0.68 mg/dL). Liver Function Tests: Recent Labs  Lab 01/10/22 1411  AST 23  ALT 14  ALKPHOS 35*  BILITOT 0.9  PROT 7.0  ALBUMIN 2.7*   Recent Labs  Lab 01/10/22 1411  LIPASE 23   No results for input(s): "AMMONIA" in the last 168 hours. Coagulation Profile: No results for input(s): "INR", "PROTIME" in the last 168 hours. Cardiac Enzymes: No results for input(s): "CKTOTAL", "CKMB", "CKMBINDEX", "TROPONINI" in the last 168 hours. BNP (last 3 results) No results for input(s): "PROBNP" in the last 8760 hours. HbA1C: No  results for input(s): "HGBA1C" in the last 72 hours. CBG: No results for input(s): "GLUCAP" in the last 168 hours. Lipid Profile: No results for input(s): "CHOL", "HDL", "LDLCALC", "TRIG", "CHOLHDL", "LDLDIRECT" in the last 72 hours. Thyroid Function Tests: No results for input(s): "TSH", "T4TOTAL", "FREET4", "T3FREE", "THYROIDAB" in the last 72 hours. Anemia Panel: Recent Labs    01/10/22 2035  VITAMINB12 1,353*  FOLATE 23.6  FERRITIN 18  TIBC 305  IRON 64   Sepsis Labs: No results for input(s): "PROCALCITON", "LATICACIDVEN" in the last 168 hours.  No results found for this or any previous visit (from the past 240 hour(s)).       Radiology Studies: No results found.      Scheduled Meds:  levothyroxine  88 mcg Oral Daily   [START ON 01/14/2022] pantoprazole  40 mg Intravenous Q12H   Continuous Infusions:  pantoprazole 8 mg/hr (01/11/22 0859)     LOS: 0 days    Time spent: 35 minutes    Barb Merino, MD [facility] Pager [phone]

## 2022-01-11 NOTE — Anesthesia Procedure Notes (Signed)
Procedure Name: MAC Date/Time: 01/11/2022 11:55 AM  Performed by: Jenne Campus, CRNAPre-anesthesia Checklist: Patient identified, Emergency Drugs available, Suction available and Patient being monitored Oxygen Delivery Method: Nasal cannula

## 2022-01-11 NOTE — Anesthesia Postprocedure Evaluation (Signed)
Anesthesia Post Note  Patient: Kathy [name]  Procedure(s) Performed: ESOPHAGOGASTRODUODENOSCOPY (EGD) WITH PROPOFOL SCLEROTHERAPY HOT HEMOSTASIS (ARGON PLASMA COAGULATION/BICAP)     Patient location during evaluation: PACU Anesthesia Type: MAC Level of consciousness: awake and alert Pain management: pain level controlled Vital Signs Assessment: post-procedure vital signs reviewed and stable Respiratory status: spontaneous breathing and respiratory function stable Cardiovascular status: stable Postop Assessment: no apparent nausea or vomiting Anesthetic complications: no   No notable events documented.  Last Vitals:  Vitals:   01/11/22 1241 01/11/22 1251  BP: (!) 117/58 126/68  Pulse: 68 68  Resp: (!) 23 (!) 23  Temp:    SpO2: 100% 100%    Last Pain:  Vitals:   01/11/22 1251  TempSrc:   PainSc: 0-No pain                 Hollis Oh DANIEL

## 2022-01-12 DIAGNOSIS — K284 Chronic or unspecified gastrojejunal ulcer with hemorrhage: Secondary | ICD-10-CM

## 2022-01-12 DIAGNOSIS — K922 Gastrointestinal hemorrhage, unspecified: Secondary | ICD-10-CM | POA: Diagnosis not present

## 2022-01-12 LAB — TYPE AND SCREEN
ABO/RH(D): AB POS
Antibody Screen: NEGATIVE
Unit division: 0

## 2022-01-12 LAB — CBC
HCT: 22.2 % — ABNORMAL LOW (ref 36.0–46.0)
Hemoglobin: 7.4 g/dL — ABNORMAL LOW (ref 12.0–15.0)
MCH: 31.5 pg (ref 26.0–34.0)
MCHC: 33.3 g/dL (ref 30.0–36.0)
MCV: 94.5 fL (ref 80.0–100.0)
Platelets: 133 10*3/uL — ABNORMAL LOW (ref 150–400)
RBC: 2.35 MIL/uL — ABNORMAL LOW (ref 3.87–5.11)
RDW: 18.3 % — ABNORMAL HIGH (ref 11.5–15.5)
WBC: 6.1 10*3/uL (ref 4.0–10.5)
nRBC: 0.7 % — ABNORMAL HIGH (ref 0.0–0.2)

## 2022-01-12 LAB — BPAM RBC
Blood Product Expiration Date: 202306152359
ISSUE DATE / TIME: 202306120027
Unit Type and Rh: 8400

## 2022-01-12 LAB — H. PYLORI ANTIBODY, IGG: H Pylori IgG: 0.34 Index Value (ref 0.00–0.79)

## 2022-01-12 MED ORDER — SODIUM CHLORIDE 0.9 % IV SOLN
250.0000 mg | Freq: Once | INTRAVENOUS | Status: AC
  Administered 2022-01-12: 250 mg via INTRAVENOUS
  Filled 2022-01-12: qty 20

## 2022-01-12 NOTE — Progress Notes (Signed)
   Patient Name: Kathy [name] Date of Encounter: 01/12/2022, 11:43 AM    Subjective  Feels ok - some blood in stool yesterday On full liquids   Objective  BP 100/62 (BP Location: Left Arm)   Pulse 72   Temp 98.2 F (36.8 C)   Resp 17   Ht '5\' 1"'$  (1.549 m)   Wt 77.1 kg   SpO2 97%   BMI 32.12 kg/m  NAD     Latest Ref Rng & Units 01/12/2022    4:36 AM 01/11/2022    4:50 PM 01/11/2022    1:37 PM  CBC  WBC 4.0 - 10.5 K/uL 6.1     Hemoglobin 12.0 - 15.0 g/dL 7.4  8.0  8.2   Hematocrit 36.0 - 46.0 % 22.2  23.1  24.7   Platelets 150 - 400 K/uL 133          Assessment and Plan  Gastrojejunal ulcer with bleeding - improved  Agree w/ continuing PPI infusion and sucralfate  Advance diet as tolerated per TRH  F/U H pylori serology  Will need outpatient f/u GI and repeat EGD to document healing at some point - will sort out before dc - if all goes well I think could DC Thurs 6/15  Agree w/ parenteral iron     Gatha Mayer, MD, [facility] See Shea Evans on call - gastroenterology for best contact person 01/12/2022 11:43 AM

## 2022-01-12 NOTE — Progress Notes (Signed)
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 Not on any NSAIDs.  Patient on Pepcid for GI protection.  Admitted with acute upper GI bleeding and blood loss anemia. Remains in the hospital on Protonix infusion.  Upper GI endoscopy with recent ulcer but no active bleeding.   Assessment & Plan:   Acute GI bleeding due to gastric ulcer with visible vessel: Anemia of acute blood loss:  Currently hemodynamically stable. Known hemoglobin of 10 , presentation hemoglobin 7.9-7.1-1 unit of PRBC transfusion-7.4. Stabilizing.  Hold off further transfusion. Though her iron levels are adequate, we will give her 1 unit of IV iron today to improve the storage of iron. Remains on Protonix infusion with plans to keep her on infusion for 72 hours due to visible blood vessels on cratered ulcer. Advance diet to full liquid diet. Patient on Pepcid at home, recommended PPI with capsule form on discharge.  Essential hypertension: Holding antihypertensives with risk of blood loss and hypotension.  Continue to hold today.  Chronic anxiety: On Xanax that is continued.  Hypothyroidism: On Synthroid.  Continued.  Rheumatoid arthritis: Follows with rheumatology.  She is on Humira.  Tramadol as needed.   DVT prophylaxis: SCDs Start: 01/10/22 1831   Code Status: Full code Family Communication: None  Disposition Plan: Status is: Inpatient.  Remains inpatient on Protonix infusion, monitoring of blood loss and advancement of diet.     Consultants:  Gastroenterology  Procedures:  Upper GI endoscopy  Antimicrobials:  None   Subjective:  Patient seen and  examined.  Denies any nausea vomiting abdominal pain or discomfort.  She had 2 small volume stool after endoscopy yesterday with maroon blood on it.  Tolerating clears.  Objective: Vitals:   01/11/22 1634 01/11/22 2028 01/12/22 0522 01/12/22 0943  BP: 112/61 (!) 105/56 109/74 100/62  Pulse: 75 76 66 72  Resp: '18 18 18 17  '$ Temp: 99.3 F (37.4 C) 98.5 F (36.9 C) 98.3 F (36.8 C) 98.2 F (36.8 C)  TempSrc:  Oral Oral   SpO2: 97% 98% 99% 97%  Weight:      Height:        Intake/Output Summary (Last 24 hours) at 01/12/2022 1029 Last data filed at 01/12/2022 0800 Gross per 24 hour  Intake 1462.72 ml  Output 1 ml  Net 1461.72 ml   Filed Weights   01/10/22 2133  Weight: 77.1 kg    Examination:  General: Looks fairly comfortable.  Alert oriented 4.  Talkative. Cardiovascular: S1-S2 normal.  Regular rate rhythm. Respiratory: Bilateral clear.  No added sounds. Gastrointestinal: Soft.  Nontender.  Bowel sound present. Ext: No swelling or deformities.  No cyanosis or edema. Neuro: Intact.  Data Reviewed: I have personally reviewed following labs and imaging studies  CBC: Recent Labs  Lab 01/10/22 1411 01/11/22 0501 01/11/22 1337 01/11/22 1650 01/12/22 0436  WBC 8.2 7.1  --   --  6.1  NEUTROABS 5.3  --   --   --   --   HGB 7.9* 7.1* 8.2* 8.0* 7.4*  HCT 25.2* 21.9* 24.7* 23.1* 22.2*  MCV 103.3* 94.8  --   --  94.5  PLT 175 135*  --   --  694*   Basic Metabolic Panel: Recent Labs  Lab 01/10/22 1411 01/11/22 0501  NA 137 139  K 4.9 4.2  CL 107 110  CO2 23 23  GLUCOSE 114* 92  BUN 19 15  CREATININE 0.83 0.68  CALCIUM 9.4 8.5*   GFR: Estimated Creatinine Clearance: 53.6 mL/min (by C-G formula based on SCr of 0.68 mg/dL). Liver Function Tests: Recent Labs  Lab 01/10/22 1411  AST 23  ALT 14  ALKPHOS 35*  BILITOT 0.9  PROT 7.0  ALBUMIN 2.7*   Recent Labs  Lab 01/10/22 1411  LIPASE 23   No results for input(s): "AMMONIA" in the last 168  hours. Coagulation Profile: Recent Labs  Lab 01/11/22 1650  INR 1.1   Cardiac Enzymes: No results for input(s): "CKTOTAL", "CKMB", "CKMBINDEX", "TROPONINI" in the last 168 hours. BNP (last 3 results) No results for input(s): "PROBNP" in the last 8760 hours. HbA1C: No results for input(s): "HGBA1C" in the last 72 hours. CBG: No results for input(s): "GLUCAP" in the last 168 hours. Lipid Profile: No results for input(s): "CHOL", "HDL", "LDLCALC", "TRIG", "CHOLHDL", "LDLDIRECT" in the last 72 hours. Thyroid Function Tests: No results for input(s): "TSH", "T4TOTAL", "FREET4", "T3FREE", "THYROIDAB" in the last 72 hours. Anemia Panel: Recent Labs    01/10/22 2035  VITAMINB12 1,353*  FOLATE 23.6  FERRITIN 18  TIBC 305  IRON 64   Sepsis Labs: No results for input(s): "PROCALCITON", "LATICACIDVEN" in the last 168 hours.  No results found for this or any previous visit (from the past 240 hour(s)).       Radiology Studies: No results found.      Scheduled Meds:  levothyroxine  88 mcg Oral Daily   [START ON 01/14/2022] pantoprazole  40 mg Intravenous Q12H   sucralfate  1 g Oral TID WC & HS   Continuous Infusions:  ferric gluconate (FERRLECIT) IVPB     pantoprazole 8 mg/hr (01/12/22 0459)     LOS: 1 day    Time spent: 35 minutes    Barb Merino, MD [facility] Pager [phone]

## 2022-01-12 NOTE — Plan of Care (Signed)
  Problem: Education: Goal: Knowledge of General Education information will improve Description Including pain rating scale, medication(s)/side effects and non-pharmacologic comfort measures Outcome: Progressing   

## 2022-01-12 NOTE — Progress Notes (Signed)
  Transition of Care [facility]) Screening Note   Patient Details  Name: Kathy [name] Date of Birth: 1940-12-14   Transition of Care [facility]) CM/SW Contact:    Tom-Johnson, Renea Ee, RN Phone Number: 01/12/2022, 12:32 PM  Patient is admitted for Acute GI Bleed. Hgb on admission was 7.1, now at 7.4. On IV Protonix.  From home alone. Does not have any children. Lamonte Sakai two brothers who resides in [place].  Independent with care and drive self prior to admission. Has a cane, walker,bsc and shower seat at home.  PCP is  Lindell Spar, MD and uses [facility] in [place].   Transition of Care Department [facility]) has reviewed patient and no TOC needs or recommendations have been identified at this time. We TOC will continue to monitor patient advancement through interdisciplinary progression rounds. If new patient transition needs arise, please place a TOC consult.

## 2022-01-13 ENCOUNTER — Encounter (HOSPITAL_COMMUNITY): Payer: Self-pay | Admitting: Internal Medicine

## 2022-01-13 ENCOUNTER — Other Ambulatory Visit: Payer: Self-pay | Admitting: Internal Medicine

## 2022-01-13 DIAGNOSIS — K284 Chronic or unspecified gastrojejunal ulcer with hemorrhage: Secondary | ICD-10-CM

## 2022-01-13 DIAGNOSIS — D62 Acute posthemorrhagic anemia: Secondary | ICD-10-CM

## 2022-01-13 LAB — HEMOGLOBIN AND HEMATOCRIT, BLOOD
HCT: 23.6 % — ABNORMAL LOW (ref 36.0–46.0)
Hemoglobin: 7.8 g/dL — ABNORMAL LOW (ref 12.0–15.0)

## 2022-01-13 NOTE — Plan of Care (Signed)
  Problem: Activity: Goal: Risk for activity intolerance will decrease Outcome: Progressing   

## 2022-01-13 NOTE — Progress Notes (Signed)
   Patient Name: Kathy [name] Date of Encounter: 01/13/2022, 12:28 PM    Subjective  No stools Tol full liquids would like solid food   Objective  BP 100/61 (BP Location: Left Arm)   Pulse 69   Temp 98.4 F (36.9 C) (Oral)   Resp 18   Ht '5\' 1"'$  (1.549 m)   Wt 77.1 kg   SpO2 97%   BMI 32.12 kg/m      Latest Ref Rng & Units 01/13/2022    4:22 AM 01/12/2022    4:36 AM 01/11/2022    4:50 PM  CBC  WBC 4.0 - 10.5 K/uL  6.1    Hemoglobin 12.0 - 15.0 g/dL 7.8  7.4  8.0   Hematocrit 36.0 - 46.0 % 23.6  22.2  23.1   Platelets 150 - 400 K/uL  133        Assessment and Plan  Gastrojejunal ulcer with hemorrhage - improved  Acute blood loss anemia - Tx w/ parenteral iron  ---------------------------------------------------------------------------- I will advance diet Change to PPI in capsule form bid at dc (omeprazole 40 mg is my recommendation) Qid carafate - tidac/hs - would prescribe the pills as suspension too costly usually She is to come to my office for CBC on 6/20 and I will coordinate follow-up from there DC in AM if remains ok Am signing off  Gatha Mayer, MD, [facility] See Shea Evans on call - gastroenterology for best contact person 01/13/2022 12:28 PM

## 2022-01-13 NOTE — Progress Notes (Signed)
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  Admitted for further management of upper GI bleed/acute blood loss anemia.  Started on Protonix drip.  GI consulted.  Underwent EGD with finding of nonbleeding gastric ulcer with visible vessel status post treatment with bipolar cautery.  Plan for discharge tomorrow in a.m. if hemoglobin remains stable.  Assessment & Plan:  Principal Problem:   Acute GI bleeding Active Problems:   Hypertension   Hypothyroid   Rheumatoid arthritis ([place])   Anxiety disorder, unspecified   Upper GI bleeding   Gastrojejunal ulcer with hemorrhage  Acute upper GI bleed: Presented with bloody emesis, bright red blood per rectum. Started on Protonix drip.  GI consulted.  Underwent EGD with finding of nonbleeding gastric ulcer with visible vessel status post treatment with bipolar cautery.  GI recommending omeprazole 40 mg twice daily twice daily, Carafate tablets and follow-up with GI on 6/20 as an outpatient.  Diet advanced to soft  Acute blood loss anemia: Baseline hemoglobin around 10.  Presented with hemoglobin in the range of 7.  Status post a unit of blood transfusion.  Currently hemoglobin stable in the range of 7. Check CBC tomorrow  Hypertension: Blood pressure soft, low normal.  Antihypertensives on hold  Anxiety: On Xanax  History of hypothyroidism: On Synthyroid  History of rheumatoid arthritis: Follows with rheumatology, on Humira           DVT prophylaxis:SCDs Start: 01/10/22 1831     Code Status: Full Code  Family Communication: SCD  Patient status:Inpatient  Patient is from  :Home  Anticipated discharge MW:NUUV  Estimated DC date:tomorrow   Consultants: GI  Procedures:EGD  Antimicrobials:  Anti-infectives (From admission, onward)    None       Subjective: Patient seen and examined at the bedside this afternoon.  Hemodynamically stable, comfortable.  Denies any abdominal pain.  She is tolerating soft diet.  Objective: Vitals:   01/12/22 1700 01/12/22 2121 01/13/22 0530 01/13/22 1006  BP: 105/61 (!) 99/56 102/61 100/61  Pulse: 68 64 60 69  Resp: '17 18 18 18  '$ Temp: 98 F (36.7 C) 98.8 F (37.1 C) 98.2 F (36.8 C) 98.4 F (36.9 C)  TempSrc:  Oral Oral Oral  SpO2: 98% 99% 99% 97%  Weight:      Height:        Intake/Output Summary (Last 24 hours) at 01/13/2022 1325 Last data filed at 01/13/2022 0900 Gross per 24 hour  Intake 997.82 ml  Output 0 ml  Net 997.82 ml   Filed Weights   01/10/22 2133  Weight: 77.1 kg    Examination:  General exam: Overall comfortable, not in distress, pleasant elderly female HEENT: PERRL Respiratory system:  no wheezes or crackles  Cardiovascular system: S1 & S2 heard, RRR.  Gastrointestinal system: Abdomen is nondistended, soft and nontender. Central nervous system: Alert and oriented Extremities: No edema, no clubbing ,no cyanosis Skin: No rashes, no ulcers,no icterus     Data Reviewed: I have personally reviewed following labs and imaging studies  CBC: Recent Labs  Lab 01/10/22 1411 01/11/22 0501 01/11/22 1337 01/11/22 1650 01/12/22 0436 01/13/22 0422  WBC 8.2 7.1  --   --  6.1  --   NEUTROABS 5.3  --   --   --   --   --   HGB 7.9* 7.1* 8.2* 8.0* 7.4* 7.8*  HCT 25.2* 21.9* 24.7* 23.1* 22.2* 23.6*  MCV 103.3* 94.8  --   --  94.5  --   PLT 175 135*  --   --  133*  --    Basic Metabolic Panel: Recent Labs  Lab 01/10/22 1411 01/11/22 0501  NA 137 139  K 4.9 4.2  CL 107 110  CO2 23 23  GLUCOSE 114* 92  BUN 19 15  CREATININE 0.83 0.68  CALCIUM 9.4 8.5*     No results found  for this or any previous visit (from the past 240 hour(s)).   Radiology Studies: No results found.  Scheduled Meds:  levothyroxine  88 mcg Oral Daily   [START ON 01/14/2022] pantoprazole  40 mg Intravenous Q12H   sucralfate  1 g Oral TID WC & HS   Continuous Infusions:  pantoprazole 8 mg/hr (01/13/22 0333)     LOS: 2 days   Shelly Coss, MD [facility] P6/14/2023, 1:25 PM

## 2022-01-14 LAB — CBC
HCT: 23.9 % — ABNORMAL LOW (ref 36.0–46.0)
Hemoglobin: 8.1 g/dL — ABNORMAL LOW (ref 12.0–15.0)
MCH: 32.7 pg (ref 26.0–34.0)
MCHC: 33.9 g/dL (ref 30.0–36.0)
MCV: 96.4 fL (ref 80.0–100.0)
Platelets: 176 10*3/uL (ref 150–400)
RBC: 2.48 MIL/uL — ABNORMAL LOW (ref 3.87–5.11)
RDW: 17.7 % — ABNORMAL HIGH (ref 11.5–15.5)
WBC: 6.1 10*3/uL (ref 4.0–10.5)
nRBC: 1 % — ABNORMAL HIGH (ref 0.0–0.2)

## 2022-01-14 MED ORDER — SUCRALFATE 1 G PO TABS: 1.0000 g | ORAL_TABLET | Freq: Four times a day (QID) | ORAL | 0 refills | Status: DC

## 2022-01-14 MED ORDER — OMEPRAZOLE 40 MG PO CPDR: 40.0000 mg | DELAYED_RELEASE_CAPSULE | Freq: Two times a day (BID) | ORAL | 1 refills | Status: DC

## 2022-01-14 NOTE — TOC Transition Note (Signed)
Transition of Care [facility]) - CM/SW Discharge Note   Patient Details  Name: Kathy [name] MRN: [id number] Date of Birth: Oct 02, 1940  Transition of Care [facility]) CM/SW Contact:  Tom-Johnson, Renea Ee, RN Phone Number: 01/14/2022, 11:08 AM   Clinical Narrative:     Patient is scheduled for discharge today. No TOC needs or recommendations noted. Family to transport at discharge.No further TOC needs noted.    Final next level of care: Home/Self Care Barriers to Discharge: Barriers Resolved   Patient Goals and CMS Choice Patient states their goals for this hospitalization and ongoing recovery are:: To return home CMS Medicare.gov Compare Post Acute Care list provided to:: Patient Choice offered to / list presented to : NA  Discharge Placement                Patient to be transferred to facility by: Family      Discharge Plan and Services                DME Arranged: N/A DME Agency: NA       HH Arranged: NA HH Agency: NA        Social Determinants of Health (SDOH) Interventions     Readmission Risk Interventions     No data to display

## 2022-01-14 NOTE — Discharge Summary (Signed)
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  Admitted for further management of upper GI bleed/acute blood loss anemia.  Started on Protonix drip.  GI consulted.  Underwent EGD with finding of nonbleeding gastric ulcer with visible vessel status post treatment with bipolar cautery.  Hemoglobin is stable, plan for discharge today to home.  She will follow-up with gastroenterology as an outpatient.    Following problems were addressed during her hospitalization:  Acute upper GI bleed: Presented with bloody emesis, bright red blood per rectum. Started on Protonix drip.  GI consulted.  Underwent EGD with finding of nonbleeding gastric ulcer with visible vessel status post treatment with bipolar cautery.  GI recommending omeprazole 40 mg twice daily twice daily, Carafate tablets and follow-up with GI on 6/20 as an outpatient.  Diet advanced to solid and she is tolerating   Acute blood loss anemia: Baseline hemoglobin around 10.  Presented with hemoglobin in the range of 7.  Given a unit of blood transfusion.  Currently hemoglobin stable in the range of 8.  Hypertension: Blood pressure soft, low normal. She takes PRN amlodipine at home   Anxiety: On Xanax   History of hypothyroidism: On Synthyroid   History of rheumatoid arthritis: Follows with rheumatology, on Humira         Discharge Diagnoses:  Principal Problem:   Acute GI bleeding Active  Problems:   Hypertension   Hypothyroid   Rheumatoid arthritis ([place])   Anxiety disorder, unspecified   Upper GI bleeding   Gastrojejunal ulcer with hemorrhage    Discharge Instructions  Discharge Instructions     Diet - low sodium heart healthy   Complete by: As directed    Discharge instructions   Complete by: As directed    1)Please take prescribed medications as instructed 2)Follow with gastroenterology on 6/20.  Do a CBC test during the follow-up to check your hemoglobin   Increase activity slowly   Complete by: As directed       Allergies as of 01/14/2022       Reactions   Aspirin Nausea And Vomiting, Other (See Comments)   Adult Aspirin: upset stomach / irritates ulcer    Macrobid [nitrofurantoin] Other (See Comments)   Stomach pain        Medication List     TAKE these medications    acetaminophen 650 MG CR tablet Commonly known as: TYLENOL Take 1,300 mg by mouth at bedtime.   ALPRAZolam 0.25 MG tablet Commonly known as: XANAX Take 0.25 mg by mouth at bedtime as needed for anxiety or sleep.   amLODipine 5 MG tablet Commonly known as: NORVASC TAKE (1) TABLET BY MOUTH ONCE DAILY. What changed:  how much to take how to take this when to take this reasons to take this additional instructions   Calcium 600+D 600-5 MG-MCG Tabs Generic drug: Calcium Carbonate-Vitamin D Take 0.5-1 tablets by mouth See admin instructions. Take one tablet by mouth twice daily - with breakfast and supper; take 1/2 tablet with lunch   diclofenac Sodium 1 % Gel  Commonly known as: VOLTAREN APPLY 2 GRAMS TO AFFECTED AREA FOUR TIMES A DAY. What changed:  how much to take how to take this when to take this additional instructions   famotidine 40 MG tablet Commonly known as: Pepcid Take 1 tablet (40 mg total) by mouth daily. What changed: when to take this   Fish Oil 500 MG Caps Take 500 mg by mouth every morning.   furosemide 20 MG tablet Commonly known as:  LASIX TAKE ONE TABLET BY MOUTH ONCE DAILY. What changed:  when to take this reasons to take this   Humira Pen 40 MG/0.4ML Pnkt Generic drug: Adalimumab Inject 40 mg into the skin every Saturday.   levothyroxine 88 MCG tablet Commonly known as: SYNTHROID Take 1 tablet (88 mcg total) by mouth daily. What changed: when to take this   multivitamin with minerals Tabs tablet Take 1 tablet by mouth every morning.   omeprazole 40 MG capsule Commonly known as: PRILOSEC Take 1 capsule (40 mg total) by mouth 2 (two) times daily.   oxycodone-acetaminophen 2.5-300 MG per tablet Commonly known as: LYNOX Take 1 tablet by mouth at bedtime as needed for pain.   potassium chloride SA 20 MEQ tablet Commonly known as: KLOR-CON M TAKE 1 TABLET (20 MEQ) BY MOUTH DAILY PRN- EDEMA What changed:  how much to take how to take this when to take this reasons to take this additional instructions   Prolia 60 MG/ML Sosy injection Generic drug: denosumab '60mg'$  Subcutaneous every 6 months 180 days What changed:  how much to take when to take this   sucralfate 1 g tablet Commonly known as: Carafate Take 1 tablet (1 g total) by mouth 4 (four) times daily.   Systane 0.4-0.3 % Soln Generic drug: Polyethyl Glycol-Propyl Glycol Place 1 drop into both eyes 2 (two) times daily.   traMADol 50 MG tablet Commonly known as: ULTRAM Take 50 mg by mouth every morning.   Vitamin D3 50 MCG (2000 UT) Tabs Take 2,000 Units by mouth every morning.        Follow-up Information     Gatha Mayer, MD Follow up on 01/19/2022.   Specialty: Gastroenterology Why: Go to lab in basement 730-5 to have blood count drawn Contact information: [address] [place] [address] [phone]                Allergies  Allergen Reactions   Aspirin Nausea And Vomiting and Other (See Comments)    Adult Aspirin: upset stomach / irritates ulcer    Macrobid [Nitrofurantoin] Other (See Comments)     Stomach pain    Consultations: GI   Procedures/Studies: MM DIAG BREAST TOMO UNI RIGHT  Result Date: 12/15/2021 CLINICAL DATA:  The patient was called back for right breast calcifications. EXAM: DIGITAL DIAGNOSTIC UNILATERAL RIGHT MAMMOGRAM WITH TOMOSYNTHESIS AND CAD TECHNIQUE: Right digital diagnostic mammography and breast tomosynthesis was performed. The images were evaluated with computer-aided detection. COMPARISON:  Previous exam(s). ACR Breast Density Category b: There are scattered areas of fibroglandular density. FINDINGS: The right breast calcifications are secretory in nature, considered benign. IMPRESSION: Benign right breast calcifications.  No evidence of malignancy. RECOMMENDATION: Annual screening mammography. I have discussed the findings and recommendations with the patient. If applicable, a reminder letter will be sent to the patient regarding the next appointment. BI-RADS CATEGORY  2: Benign. Electronically Signed   By: Dorise Bullion III M.D.   On: 12/15/2021 13:25     Subjective: Patient seen and examined  at the bedside this morning.  Hemodynamically stable for discharge today.  Discharge Exam: Vitals:   01/13/22 2049 01/14/22 0458  BP: (!) 93/57 111/70  Pulse: 66 60  Resp: 19 19  Temp:  98.1 F (36.7 C)  SpO2: 99% 100%   Vitals:   01/13/22 1006 01/13/22 1811 01/13/22 2049 01/14/22 0458  BP: 100/61 98/68 (!) 93/57 111/70  Pulse: 69 92 66 60  Resp: '18 19 19 19  '$ Temp: 98.4 F (36.9 C) 98 F (36.7 C)  98.1 F (36.7 C)  TempSrc: Oral Oral  Oral  SpO2: 97% 97% 99% 100%  Weight:      Height:        General: Pt is alert, awake, not in acute distress Cardiovascular: RRR, S1/S2 +, no rubs, no gallops Respiratory: CTA bilaterally, no wheezing, no rhonchi Abdominal: Soft, NT, ND, bowel sounds + Extremities: no edema, no cyanosis    The results of significant diagnostics from this hospitalization (including imaging, microbiology, ancillary and laboratory)  are listed below for reference.     Microbiology: No results found for this or any previous visit (from the past 240 hour(s)).   Labs: BNP (last 3 results) No results for input(s): "BNP" in the last 8760 hours. Basic Metabolic Panel: Recent Labs  Lab 01/10/22 1411 01/11/22 0501  NA 137 139  K 4.9 4.2  CL 107 110  CO2 23 23  GLUCOSE 114* 92  BUN 19 15  CREATININE 0.83 0.68  CALCIUM 9.4 8.5*   Liver Function Tests: Recent Labs  Lab 01/10/22 1411  AST 23  ALT 14  ALKPHOS 35*  BILITOT 0.9  PROT 7.0  ALBUMIN 2.7*   Recent Labs  Lab 01/10/22 1411  LIPASE 23   No results for input(s): "AMMONIA" in the last 168 hours. CBC: Recent Labs  Lab 01/10/22 1411 01/11/22 0501 01/11/22 1337 01/11/22 1650 01/12/22 0436 01/13/22 0422 01/14/22 0206  WBC 8.2 7.1  --   --  6.1  --  6.1  NEUTROABS 5.3  --   --   --   --   --   --   HGB 7.9* 7.1* 8.2* 8.0* 7.4* 7.8* 8.1*  HCT 25.2* 21.9* 24.7* 23.1* 22.2* 23.6* 23.9*  MCV 103.3* 94.8  --   --  94.5  --  96.4  PLT 175 135*  --   --  133*  --  176   Cardiac Enzymes: No results for input(s): "CKTOTAL", "CKMB", "CKMBINDEX", "TROPONINI" in the last 168 hours. BNP: Invalid input(s): "POCBNP" CBG: No results for input(s): "GLUCAP" in the last 168 hours. D-Dimer No results for input(s): "DDIMER" in the last 72 hours. Hgb A1c No results for input(s): "HGBA1C" in the last 72 hours. Lipid Profile No results for input(s): "CHOL", "HDL", "LDLCALC", "TRIG", "CHOLHDL", "LDLDIRECT" in the last 72 hours. Thyroid function studies No results for input(s): "TSH", "T4TOTAL", "T3FREE", "THYROIDAB" in the last 72 hours.  Invalid input(s): "FREET3" Anemia work up No results for input(s): "VITAMINB12", "FOLATE", "FERRITIN", "TIBC", "IRON", "RETICCTPCT" in the last 72 hours. Urinalysis    Component Value Date/Time   COLORURINE YELLOW 04/16/2020 1630   APPEARANCEUR CLOUDY (A) 04/16/2020 1630   LABSPEC 1.020 04/16/2020 1630   PHURINE 5.5  04/16/2020 1630   GLUCOSEU NEGATIVE 04/16/2020 1630   HGBUR NEGATIVE 04/16/2020 1630   BILIRUBINUR NEGATIVE 09/26/2013 1042   KETONESUR TRACE (A) 04/16/2020 1630   PROTEINUR NEGATIVE 04/16/2020 1630   UROBILINOGEN 0.2 09/26/2013 1042   NITRITE NEGATIVE 04/16/2020 1630  LEUKOCYTESUR NEGATIVE 04/16/2020 1630   Sepsis Labs Recent Labs  Lab 01/10/22 1411 01/11/22 0501 01/12/22 0436 01/14/22 0206  WBC 8.2 7.1 6.1 6.1   Microbiology No results found for this or any previous visit (from the past 240 hour(s)).  Please note: You were cared for by a hospitalist during your hospital stay. Once you are discharged, your primary care physician will handle any further medical issues. Please note that NO REFILLS for any discharge medications will be authorized once you are discharged, as it is imperative that you return to your primary care physician (or establish a relationship with a primary care physician if you do not have one) for your post hospital discharge needs so that they can reassess your need for medications and monitor your lab values.    Time coordinating discharge: 40 minutes  SIGNED:   Shelly Coss, MD  [facility] 01/14/2022, 10:22 AM Pager [phone]  If 7PM-7AM, please contact night-coverage [email or web address] Password [email or web address]

## 2022-01-14 NOTE — Care Management Important Message (Signed)
[facility] Message  Patient Details  Name: Kathy [name] MRN: [id number] Date of Birth: [birth year]   Medicare Important Message Given:  Yes   Patient left prior to IM delivery will mail to the patient home address  Orbie Pyo 01/14/2022, 2:31 PM

## 2022-01-15 ENCOUNTER — Telehealth: Payer: Self-pay | Admitting: *Deleted

## 2022-01-15 NOTE — Telephone Encounter (Signed)
Transition Care Management Follow-up Telephone Call Date of discharge and from where: 01-14-22 [facility]  How have you been since you were released from the hospital? She is better but a little weak  Any questions or concerns? No  Items Reviewed: Did the pt receive and understand the discharge instructions provided? Yes  Medications obtained and verified? Yes  Other? No  Any new allergies since your discharge? No  Dietary orders reviewed? Yes Do you have support at home? Yes   Home Care and Equipment/Supplies: Were home health services ordered? not applicable If so, what is the name of the agency? NA  Has the agency set up a time to come to the patient's home? not applicable Were any new equipment or medical supplies ordered?  No What is the name of the medical supply agency? NA Were you able to get the supplies/equipment? not applicable Do you have any questions related to the use of the equipment or supplies? No  Functional Questionnaire: (I = Independent and D = Dependent) ADLs: i  Bathing/Dressing- i  Meal Prep- i  Eating- i  Maintaining continence- i  Transferring/Ambulation- i  Managing Meds- i  Follow up appointments reviewed:  PCP Hospital f/u appt confirmed? Yes  Scheduled to see Peter Congo on 01-21-22 @ 11:00. [facility] f/u appt confirmed? No   Are transportation arrangements needed? No  If their condition worsens, is the pt aware to call PCP or go to the Emergency Dept.? Yes Was the patient provided with contact information for the PCP's office or ED? Yes Was to pt encouraged to call back with questions or concerns? Yes

## 2022-01-19 ENCOUNTER — Other Ambulatory Visit (INDEPENDENT_AMBULATORY_CARE_PROVIDER_SITE_OTHER): Payer: Medicare Other

## 2022-01-19 DIAGNOSIS — K284 Chronic or unspecified gastrojejunal ulcer with hemorrhage: Secondary | ICD-10-CM

## 2022-01-19 LAB — CBC
HCT: 25.4 % — ABNORMAL LOW (ref 36.0–46.0)
Hemoglobin: 8.4 g/dL — ABNORMAL LOW (ref 12.0–15.0)
MCHC: 32.9 g/dL (ref 30.0–36.0)
MCV: 98.1 fl (ref 78.0–100.0)
Platelets: 242 10*3/uL (ref 150.0–400.0)
RBC: 2.59 Mil/uL — ABNORMAL LOW (ref 3.87–5.11)
RDW: 17.7 % — ABNORMAL HIGH (ref 11.5–15.5)
WBC: 5.5 10*3/uL (ref 4.0–10.5)

## 2022-01-20 ENCOUNTER — Other Ambulatory Visit: Payer: Self-pay

## 2022-01-20 DIAGNOSIS — K284 Chronic or unspecified gastrojejunal ulcer with hemorrhage: Secondary | ICD-10-CM

## 2022-01-21 ENCOUNTER — Ambulatory Visit (INDEPENDENT_AMBULATORY_CARE_PROVIDER_SITE_OTHER): Payer: Medicare Other | Admitting: Family Medicine

## 2022-01-21 ENCOUNTER — Encounter: Payer: Self-pay | Admitting: Family Medicine

## 2022-01-21 VITALS — BP 116/72 | HR 60 | Ht 61.5 in | Wt 173.8 lb

## 2022-01-21 DIAGNOSIS — K625 Hemorrhage of anus and rectum: Secondary | ICD-10-CM

## 2022-01-21 NOTE — Patient Instructions (Addendum)
I appreciate the opportunity to provide care to you today!    Follow up:  3 months  Avoid certain foods and drinks, such as coffee, chocolate, onions, peppermint, spicy foods, carbonated beverages, citrus fruits, tomatoes, onions, Garlic, alcohol, Fatty foods (bacon, burgers, sausages, steak, fried foods, dairy food)    Recommended: High fiber foods, whole grain cereal, oatmeal, brown rice, root vegetables, non- citrus fruits, High protein foods, Health fats (avocados, olive oil, nuts and seeds)   Please continue to a heart-healthy diet and increase your physical activities. Try to exercise for 57mns at least three times a week.      It was a pleasure to see you and I look forward to continuing to work together on your health and well-being. Please do not hesitate to call the office if you need care or have questions about your care.   Have a wonderful day and week. With Gratitude, GAlvira MondayMSN, FNP-BC'

## 2022-01-21 NOTE — Assessment & Plan Note (Signed)
-  Labs and imaging reviewed  -She denies any recent episodes of hematochezia and hematemesis -She is compliant with her medications and is noted to be following up with GI the second week in July  Recommended eliminating food that can worsen gastric ulcer  -Avoid certain foods and drinks, such as coffee, chocolate, onions, peppermint, spicy foods, carbonated beverages, citrus fruits, tomatoes, onions, Garlic, alcohol, Fatty foods (bacon, burgers, sausages, steak, fried foods, dairy food) -Recommended: High fiber foods, whole grain cereal, oatmeal, brown rice, root vegetables, non- citrus fruits, High protein foods, Health fats (avocados, olive oil, nuts and seeds)

## 2022-01-22 ENCOUNTER — Other Ambulatory Visit: Payer: Self-pay | Admitting: Internal Medicine

## 2022-01-22 DIAGNOSIS — R600 Localized edema: Secondary | ICD-10-CM

## 2022-01-29 DIAGNOSIS — G5600 Carpal tunnel syndrome, unspecified upper limb: Secondary | ICD-10-CM | POA: Diagnosis not present

## 2022-01-29 DIAGNOSIS — M79643 Pain in unspecified hand: Secondary | ICD-10-CM | POA: Diagnosis not present

## 2022-01-29 DIAGNOSIS — Z79891 Long term (current) use of opiate analgesic: Secondary | ICD-10-CM | POA: Diagnosis not present

## 2022-01-29 DIAGNOSIS — M25569 Pain in unspecified knee: Secondary | ICD-10-CM | POA: Diagnosis not present

## 2022-01-30 DIAGNOSIS — Z79891 Long term (current) use of opiate analgesic: Secondary | ICD-10-CM | POA: Diagnosis not present

## 2022-01-30 DIAGNOSIS — M545 Low back pain, unspecified: Secondary | ICD-10-CM | POA: Diagnosis not present

## 2022-02-19 ENCOUNTER — Other Ambulatory Visit (INDEPENDENT_AMBULATORY_CARE_PROVIDER_SITE_OTHER): Payer: Medicare Other

## 2022-02-19 DIAGNOSIS — K284 Chronic or unspecified gastrojejunal ulcer with hemorrhage: Secondary | ICD-10-CM | POA: Diagnosis not present

## 2022-02-19 LAB — CBC WITH DIFFERENTIAL/PLATELET
Basophils Absolute: 0 K/uL (ref 0.0–0.1)
Basophils Relative: 0.9 % (ref 0.0–3.0)
Eosinophils Absolute: 0.1 K/uL (ref 0.0–0.7)
Eosinophils Relative: 1.4 % (ref 0.0–5.0)
HCT: 27.7 % — ABNORMAL LOW (ref 36.0–46.0)
Hemoglobin: 9.3 g/dL — ABNORMAL LOW (ref 12.0–15.0)
Lymphocytes Relative: 62.9 % — ABNORMAL HIGH (ref 12.0–46.0)
Lymphs Abs: 2.8 K/uL (ref 0.7–4.0)
MCHC: 33.6 g/dL (ref 30.0–36.0)
MCV: 95.9 fl (ref 78.0–100.0)
Monocytes Absolute: 0.5 K/uL (ref 0.1–1.0)
Monocytes Relative: 11.3 % (ref 3.0–12.0)
Neutro Abs: 1 K/uL — ABNORMAL LOW (ref 1.4–7.7)
Neutrophils Relative %: 23.5 % — ABNORMAL LOW (ref 43.0–77.0)
Platelets: 217 K/uL (ref 150.0–400.0)
RBC: 2.88 Mil/uL — ABNORMAL LOW (ref 3.87–5.11)
RDW: 16.2 % — ABNORMAL HIGH (ref 11.5–15.5)
WBC: 4.4 K/uL (ref 4.0–10.5)

## 2022-02-22 ENCOUNTER — Other Ambulatory Visit: Payer: Self-pay | Admitting: Internal Medicine

## 2022-02-22 ENCOUNTER — Other Ambulatory Visit: Payer: Self-pay

## 2022-02-22 ENCOUNTER — Telehealth: Payer: Self-pay | Admitting: Internal Medicine

## 2022-02-22 DIAGNOSIS — R600 Localized edema: Secondary | ICD-10-CM

## 2022-02-22 DIAGNOSIS — K284 Chronic or unspecified gastrojejunal ulcer with hemorrhage: Secondary | ICD-10-CM

## 2022-02-22 DIAGNOSIS — K219 Gastro-esophageal reflux disease without esophagitis: Secondary | ICD-10-CM

## 2022-02-22 MED ORDER — FERROUS SULFATE 325 (65 FE) MG PO TBEC: 325.0000 mg | DELAYED_RELEASE_TABLET | Freq: Every day | ORAL | 3 refills | Status: DC

## 2022-02-22 NOTE — Telephone Encounter (Signed)
Patient returned your call, please advise. 

## 2022-02-22 NOTE — Telephone Encounter (Signed)
Spoke with pt. Documented under result notes:  

## 2022-02-26 DIAGNOSIS — Z79891 Long term (current) use of opiate analgesic: Secondary | ICD-10-CM | POA: Diagnosis not present

## 2022-02-26 DIAGNOSIS — M79643 Pain in unspecified hand: Secondary | ICD-10-CM | POA: Diagnosis not present

## 2022-02-26 DIAGNOSIS — M25569 Pain in unspecified knee: Secondary | ICD-10-CM | POA: Diagnosis not present

## 2022-02-26 DIAGNOSIS — M545 Low back pain, unspecified: Secondary | ICD-10-CM | POA: Diagnosis not present

## 2022-03-02 NOTE — Progress Notes (Unsigned)
Cardiology Office Note:    Date:  03/03/2022   ID:  Kathy [name], DOB 1941/03/24, MRN [id number]  PCP:  Lindell Spar, MD   [name] Providers Cardiologist:  Carlyle Dolly, MD { Click to update primary MD,subspecialty MD or APP then REFRESH:1}    Referring MD: Lindell Spar, MD   Chief Complaint: leg swelling  History of Present Illness:    Kathy [name] is a very pleasant 81 y.o. female with a hx of palpitations, leg edema, wandering pacemaker, RBBB, and hypertension.  Was referred to cardiology for leg edema by PCP  and initially seen 12/23/2016. She had normal LVEF 08-14%, grade 1 diastolic dysfunction, PASP 20, mild RV dilatation on echo. Norvasc had recently been stopped by PCP. Encouraged to use compression, increase Lasix as needed. Maintained  regular follow-up, advised to f/u as needed and there was a gap in care from 04/2018 to 06/2021.  She was last seen in our office on 06/17/21 by Dr. Zandra Abts. She was having leg edema that he felt was likely 2/2 venous insufficiency. EKG revealed wandering pacemaker, chronic RBBB, continue to monitor for conduction disease. Advised to follow-up as needed.  Today, she is here for leg swelling. Her swelling had improved at last office visit and she stopped wearing her support hose.  Feels like pants are tighter and thighs are bigger.   Past Medical History:  Diagnosis Date   Abnormal findings on diagnostic imaging of heart and coronary circulation    Anemia    FROM BLEEDING ULCER   Anxiety    takes Alprazolam daily as needed   Arthritis    dx with RA 2017   Bariatric surgery status    Cataract    Diverticulosis    Gastro-esophageal reflux disease with esophagitis    Gastrojejunal ulcer with hemorrhage    Headache(784.0)    occasionally   History of blood transfusion    no abnormal reaction noted   History of bronchitis    last time many yrs ago   Hyperlipidemia    PT DENIES THIS DX -  ON NO MEDS AND NO  ONE HAS TOLD HER   Hypertension    takes Amlodipine daily   Hypothyroidism    takes Synthroid daily   Insomnia    Joint pain    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   Localized edema    Nocturia    Numbness    occasionally left arm at night   Obesity    Osteoporosis    takes Fosamax weekly   Pain in joint involving pelvic region and thigh    Peripheral edema    takes Lasix daily as needed   PONV (postoperative nausea and vomiting)    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/2017   Rheumatoid arthritis, unspecified ([place])    Rotator cuff arthropathy, 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   Sleep apnea    Phreesia 08/22/2020   Stomach ulcer    Thyroid disease    Phreesia 08/22/2020   Unspecified injury of muscle(s) and tendon(s) of the rotator cuff of left shoulder, subsequent encounter    Unspecified osteoarthritis, unspecified site    Wears glasses    Wears partial dentures     Past Surgical History:  Procedure Laterality Date   ABDOMINAL HYSTERECTOMY     partial   cataract surgery Bilateral    CHOLECYSTECTOMY     COLONOSCOPY     ESOPHAGOGASTRODUODENOSCOPY (EGD) WITH PROPOFOL N/A 01/11/2022   Procedure: ESOPHAGOGASTRODUODENOSCOPY (EGD)  WITH PROPOFOL;  Surgeon: Gatha Mayer, MD;  Location: [place];  Service: Gastroenterology;  Laterality: N/A;   EYE SURGERY     CATARACTS BOTH   gastric bypass surgery     HOT HEMOSTASIS N/A 01/11/2022   Procedure: HOT HEMOSTASIS (ARGON PLASMA COAGULATION/BICAP);  Surgeon: Gatha Mayer, MD;  Location: [place];  Service: Gastroenterology;  Laterality: N/A;   JOINT REPLACEMENT Bilateral    hip   REVERSE SHOULDER ARTHROPLASTY Right 05/11/2016   Procedure: REVERSE SHOULDER ARTHROPLASTY;  Surgeon: Marchia Bond, MD;  Location: [place];  Service: Orthopedics;  Laterality: Right;   REVISION TOTAL HIP ARTHROPLASTY Left 10/03/2013   DR Mayer Camel   ROTATOR CUFF REPAIR     SCLEROTHERAPY  01/11/2022   Procedure: Clide Deutscher;  Surgeon:  Gatha Mayer, MD;  Location: [facility] ENDOSCOPY;  Service: Gastroenterology;;   TOTAL HIP REVISION Left 10/03/2013   Procedure: TOTAL HIP REVISION- left;  Surgeon: Kerin Salen, MD;  Location: [place];  Service: Orthopedics;  Laterality: Left;   TOTAL KNEE ARTHROPLASTY Left 11/29/2017   Procedure: LEFT TOTAL KNEE ARTHROPLASTY;  Surgeon: Marchia Bond, MD;  Location: [place];  Service: Orthopedics;  Laterality: Left;   TOTAL SHOULDER ARTHROPLASTY Left 11/09/2016   Procedure: TOTAL REVERSE SHOULDER ARTHROPLASTY;  Surgeon: Marchia Bond, MD;  Location: [place];  Service: Orthopedics;  Laterality: Left;   TOTAL SHOULDER REPLACEMENT Left 10/2016   TOTAL THYROIDECTOMY      Current Medications: Current Meds  Medication Sig   acetaminophen (TYLENOL) 650 MG CR tablet Take 1,300 mg by mouth at bedtime.   Adalimumab (HUMIRA PEN) 40 MG/0.4ML PNKT Inject 40 mg into the skin every Saturday.   ALPRAZolam (XANAX) 0.25 MG tablet Take 0.25 mg by mouth at bedtime as needed for anxiety or sleep.   amLODipine (NORVASC) 5 MG tablet TAKE (1) TABLET BY MOUTH ONCE DAILY.   Calcium Carbonate-Vitamin D (CALCIUM 600+D) 600-5 MG-MCG TABS Take 0.5-1 tablets by mouth See admin instructions. Take one tablet by mouth twice daily - with breakfast and supper; take 1/2 tablet with lunch   Cholecalciferol (VITAMIN D3) 50 MCG (2000 UT) TABS Take 2,000 Units by mouth every morning.   denosumab (PROLIA) 60 MG/ML SOSY injection '60mg'$  Subcutaneous every 6 months 180 days (Patient taking differently: Inject 60 mg into the skin every 6 (six) months.)   diclofenac Sodium (VOLTAREN) 1 % GEL APPLY 2 GRAMS TO AFFECTED AREA FOUR TIMES A DAY. (Patient taking differently: Apply 2 g topically at bedtime. Apply to hands, wrists, neck)   ferrous sulfate 325 (65 FE) MG EC tablet Take 1 tablet (325 mg total) by mouth daily.   levothyroxine (SYNTHROID) 88 MCG tablet Take 1 tablet (88 mcg total) by mouth daily. (Patient taking differently: Take 88 mcg by mouth  daily before breakfast.)   Multiple Vitamin (MULTIVITAMIN WITH MINERALS) TABS tablet Take 1 tablet by mouth every morning.   Omega-3 Fatty Acids (FISH OIL) 500 MG CAPS Take 500 mg by mouth every morning.   omeprazole (PRILOSEC) 40 MG capsule Take 1 capsule (40 mg total) by mouth 2 (two) times daily.   oxycodone-acetaminophen (LYNOX) 2.5-300 MG per tablet Take 1 tablet by mouth at bedtime as needed for pain.   Polyethyl Glycol-Propyl Glycol (SYSTANE) 0.4-0.3 % SOLN Place 1 drop into both eyes 2 (two) times daily.   potassium chloride SA (KLOR-CON M) 20 MEQ tablet TAKE 1 TABLET (20 MEQ) BY MOUTH DAILY PRN- EDEMA (Patient taking differently: Take 20 mEq by mouth daily as needed (  with each dose of Lasix).)   traMADol (ULTRAM) 50 MG tablet Take 50 mg by mouth every morning.   [DISCONTINUED] furosemide (LASIX) 20 MG tablet TAKE ONE TABLET BY MOUTH ONCE DAILY.     Allergies:   Aspirin and Macrobid [nitrofurantoin]   Social History   Socioeconomic History   Marital status: Divorced    Spouse name: Not on file   Number of children: Not on file   Years of education: Not on file   Highest education level: Not on file  Occupational History   Occupation: disabled  Tobacco Use   Smoking status: Never   Smokeless tobacco: Never  Vaping Use   Vaping Use: Never used  Substance and Sexual Activity   Alcohol use: No   Drug use: No   Sexual activity: Not Currently    Birth control/protection: Surgical  Other Topics Concern   Not on file  Social History Narrative   Not on file   Social Determinants of Health   Financial Resource Strain: Low Risk  (09/14/2021)   Overall Financial Resource Strain (CARDIA)    Difficulty of Paying Living Expenses: Not hard at all  Food Insecurity: No Food Insecurity (09/14/2021)   Hunger Vital Sign    Worried About Running Out of Food in the Last Year: Never true    [place] in the Last Year: Never true  Transportation Needs: No Transportation Needs  (09/14/2021)   PRAPARE - Hydrologist (Medical): No    Lack of Transportation (Non-Medical): No  Physical Activity: Sufficiently Active (09/14/2021)   Exercise Vital Sign    Days of Exercise per Week: 6 days    Minutes of Exercise per Session: 40 min  Stress: No Stress Concern Present (09/14/2021)   [place]    Feeling of Stress : Not at all  Social Connections: Moderately Isolated (09/14/2021)   Social Connection and Isolation Panel [NHANES]    Frequency of Communication with Friends and Family: More than three times a week    Frequency of Social Gatherings with Friends and Family: More than three times a week    Attends Religious Services: More than 4 times per year    Active Member of [employer] or Organizations: No    Attends Music therapist: Never    Marital Status: Divorced     Family History: The patient's family history includes CAD in her brother; Congestive Heart Failure in her mother; Diabetes in her mother; Lung cancer in her father; Thrombosis in her sister.  ROS:   Please see the history of present illness.    + bilateral leg edema, R > L All other systems reviewed and are negative.  Labs/Other Studies Reviewed:    The following studies were reviewed today:  Echo 12/09/16  - Left ventricle: The cavity size was normal. Wall thickness was    normal. Systolic function was normal. The estimated ejection    fraction was in the range of 55% to 60%. Wall motion was normal;    there were no regional wall motion abnormalities. Doppler    parameters are consistent with abnormal left ventricular    relaxation (grade 1 diastolic dysfunction).  - Aortic valve: Mildly calcified annulus. Trileaflet.  - Mitral valve: There was trivial regurgitation.  - Right ventricle: The cavity size was mildly dilated.  - Right atrium: The atrium was at the upper limits of normal in  size.  - Atrial septum: No defect or patent foramen ovale was identified.  - Tricuspid valve: There was trivial regurgitation.  - Pulmonary arteries: PA peak pressure: 20 mm Hg (S).  - Pericardium, extracardiac: A small pericardial effusion was    identified circumferential to the heart.    Recent Labs: 03/18/2021: TSH 0.320 01/10/2022: ALT 14 01/11/2022: BUN 15; Creatinine, Ser 0.68; Potassium 4.2; Sodium 139 02/19/2022: Hemoglobin 9.3; Platelets 217.0  Recent Lipid Panel    Component Value Date/Time   CHOL 165 08/25/2020 1118   TRIG 75 08/25/2020 1118   HDL 63 08/25/2020 1118   CHOLHDL 2.6 08/25/2020 1118   VLDL 9 05/23/2017 1102   LDLCALC 86 08/25/2020 1118     Risk Assessment/Calculations:      Physical Exam:    VS:  BP 120/70   Pulse 60   Ht 5' 1.5" (1.562 m)   Wt 168 lb (76.2 kg)   SpO2 99%   BMI 31.23 kg/m     Wt Readings from Last 3 Encounters:  03/03/22 168 lb (76.2 kg)  01/21/22 173 lb 12.8 oz (78.8 kg)  01/10/22 169 lb 15.6 oz (77.1 kg)     GEN: Well nourished, well developed in no acute distress HEENT: Normal NECK: No JVD; No carotid bruits CARDIAC: ***RRR, no murmurs, rubs, gallops RESPIRATORY:  Clear to auscultation without rales, wheezing or rhonchi  ABDOMEN: Soft, non-tender, non-distended MUSCULOSKELETAL:  No edema; No deformity. *** pedal pulses, ***bilaterally SKIN: Warm and dry NEUROLOGIC:  Alert and oriented x 3 PSYCHIATRIC:  Normal affect   EKG:  EKG is ordered today.  The ekg ordered today demonstrates NSR at 60 bpm, RBBB, no acute change from previous  Diagnoses:    1. RBBB (right bundle branch block with left anterior fascicular block)   2. Leg edema    Assessment and Plan:     Leg edema:        Medication Adjustments/Labs and Tests Ordered: Current medicines are reviewed at length with the patient today.  Concerns regarding medicines are outlined above.  Orders Placed This Encounter  Procedures   EKG 12-Lead    ECHOCARDIOGRAM COMPLETE   Meds ordered this encounter  Medications   furosemide (LASIX) 20 MG tablet    Sig: Take 1 tablet (20 mg total) by mouth daily.    Dispense:  90 tablet    Refill:  3    Patient Instructions  Medication Instructions:   INCREASE Lasix two (2) tablets by mouth (40 mg) daily X 3 days than go back to one (1) tablet by mouth ( 20 mg ) daily.   INCREASE Potassium two (2) tablets by mouth ( 40 mEq) daily X 3 days than go back to (1) tablet by mouth (20 mEq) daily.   *If you need a refill on your cardiac medications before your next appointment, please call your pharmacy*   Lab Work:  None ordered.  If you have labs (blood work) drawn today and your tests are completely normal, you will receive your results only by: [place] (if you have MyChart) OR A paper copy in the mail If you have any lab test that is abnormal or we need to change your treatment, we will call you to review the results.   Testing/Procedures:  Your physician has requested that you have an echocardiogram. Echocardiography is a painless test that uses sound waves to create images of your heart. It provides your doctor with information about the size and shape of your  heart and how well your heart's chambers and valves are working. This procedure takes approximately one hour. There are no restrictions for this procedure.AT [facility] for EDEMA.    Follow-Up: At [facility], you and your health needs are our priority.  As part of our continuing mission to provide you with exceptional heart care, we have created designated Provider Care Teams.  These Care Teams include your primary Cardiologist (physician) and Advanced Practice Providers (APPs -  Physician Assistants and Nurse Practitioners) who all work together to provide you with the care you need, when you need it.  We recommend signing up for the patient portal called "MyChart".  Sign up information is provided on this After Visit  Summary.  MyChart is used to connect with patients for Virtual Visits (Telemedicine).  Patients are able to view lab/test results, encounter notes, upcoming appointments, etc.  Non-urgent messages can be sent to your provider as well.   To learn more about what you can do with MyChart, go to [email or web address].    Your next appointment:   3 month(s)  The format for your next appointment:   In Person  Provider:   Carlyle Dolly, MD    Important Information About Sugar         Signed, Emmaline Life, NP  03/03/2022 5:33 PM    [place]

## 2022-03-03 ENCOUNTER — Ambulatory Visit (INDEPENDENT_AMBULATORY_CARE_PROVIDER_SITE_OTHER): Payer: Medicare Other | Admitting: Nurse Practitioner

## 2022-03-03 ENCOUNTER — Encounter: Payer: Self-pay | Admitting: Nurse Practitioner

## 2022-03-03 VITALS — BP 120/70 | HR 60 | Ht 61.5 in | Wt 168.0 lb

## 2022-03-03 DIAGNOSIS — I452 Bifascicular block: Secondary | ICD-10-CM

## 2022-03-03 DIAGNOSIS — R0609 Other forms of dyspnea: Secondary | ICD-10-CM

## 2022-03-03 DIAGNOSIS — R600 Localized edema: Secondary | ICD-10-CM

## 2022-03-03 MED ORDER — FUROSEMIDE 20 MG PO TABS: 20.0000 mg | ORAL_TABLET | Freq: Every day | ORAL | 3 refills | Status: DC

## 2022-03-03 NOTE — Patient Instructions (Signed)
Medication Instructions:   INCREASE Lasix two (2) tablets by mouth (40 mg) daily X 3 days than go back to one (1) tablet by mouth ( 20 mg ) daily.   INCREASE Potassium two (2) tablets by mouth ( 40 mEq) daily X 3 days than go back to (1) tablet by mouth (20 mEq) daily.   *If you need a refill on your cardiac medications before your next appointment, please call your pharmacy*   Lab Work:  None ordered.  If you have labs (blood work) drawn today and your tests are completely normal, you will receive your results only by: [place] (if you have MyChart) OR A paper copy in the mail If you have any lab test that is abnormal or we need to change your treatment, we will call you to review the results.   Testing/Procedures:  Your physician has requested that you have an echocardiogram. Echocardiography is a painless test that uses sound waves to create images of your heart. It provides your doctor with information about the size and shape of your heart and how well your heart's chambers and valves are working. This procedure takes approximately one hour. There are no restrictions for this procedure.AT [facility] for EDEMA.    Follow-Up: At [facility], you and your health needs are our priority.  As part of our continuing mission to provide you with exceptional heart care, we have created designated Provider Care Teams.  These Care Teams include your primary Cardiologist (physician) and Advanced Practice Providers (APPs -  Physician Assistants and Nurse Practitioners) who all work together to provide you with the care you need, when you need it.  We recommend signing up for the patient portal called "MyChart".  Sign up information is provided on this After Visit Summary.  MyChart is used to connect with patients for Virtual Visits (Telemedicine).  Patients are able to view lab/test results, encounter notes, upcoming appointments, etc.  Non-urgent messages can be sent to your provider as  well.   To learn more about what you can do with MyChart, go to [email or web address].    Your next appointment:   3 month(s)  The format for your next appointment:   In Person  Provider:   Carlyle Dolly, MD    Important Information About Sugar

## 2022-03-04 ENCOUNTER — Encounter: Payer: Self-pay | Admitting: Nurse Practitioner

## 2022-03-11 ENCOUNTER — Ambulatory Visit (HOSPITAL_COMMUNITY)
Admission: RE | Admit: 2022-03-11 | Discharge: 2022-03-11 | Disposition: A | Discharge status: Home / Self Care | Payer: Medicare Other | Source: Ambulatory Visit | Attending: Nurse Practitioner | Admitting: Nurse Practitioner

## 2022-03-11 DIAGNOSIS — R600 Localized edema: Secondary | ICD-10-CM | POA: Diagnosis not present

## 2022-03-11 LAB — ECHOCARDIOGRAM COMPLETE
AR max vel: 2.36 cm2
AV Area VTI: 2.52 cm2
AV Area mean vel: 2.43 cm2
AV Mean grad: 5.1 mmHg
AV Peak grad: 10.5 mmHg
Ao pk vel: 1.62 m/s
Area-P 1/2: 3.42 cm2
S' Lateral: 2.7 cm

## 2022-03-11 NOTE — Progress Notes (Signed)
*  PRELIMINARY RESULTS* Echocardiogram 2D Echocardiogram has been performed.  Kathy [name] 03/11/2022, 12:31 PM

## 2022-03-16 ENCOUNTER — Encounter: Payer: Self-pay | Admitting: Internal Medicine

## 2022-03-16 ENCOUNTER — Ambulatory Visit: Payer: Medicare Other | Admitting: Internal Medicine

## 2022-03-16 ENCOUNTER — Ambulatory Visit (INDEPENDENT_AMBULATORY_CARE_PROVIDER_SITE_OTHER): Payer: Medicare Other | Admitting: Internal Medicine

## 2022-03-16 VITALS — BP 118/80 | HR 72 | Resp 18 | Ht 61.5 in | Wt 163.0 lb

## 2022-03-16 DIAGNOSIS — E038 Other specified hypothyroidism: Secondary | ICD-10-CM | POA: Diagnosis not present

## 2022-03-16 DIAGNOSIS — R600 Localized edema: Secondary | ICD-10-CM

## 2022-03-16 DIAGNOSIS — E782 Mixed hyperlipidemia: Secondary | ICD-10-CM

## 2022-03-16 DIAGNOSIS — K284 Chronic or unspecified gastrojejunal ulcer with hemorrhage: Secondary | ICD-10-CM | POA: Diagnosis not present

## 2022-03-16 DIAGNOSIS — K219 Gastro-esophageal reflux disease without esophagitis: Secondary | ICD-10-CM

## 2022-03-16 DIAGNOSIS — I10 Essential (primary) hypertension: Secondary | ICD-10-CM | POA: Diagnosis not present

## 2022-03-16 MED ORDER — FUROSEMIDE 20 MG PO TABS: 20.0000 mg | ORAL_TABLET | Freq: Two times a day (BID) | ORAL | 3 refills | Status: DC

## 2022-03-16 NOTE — Patient Instructions (Signed)
Please take Lasix 20 mg once daily, can take additional dose for excessive leg swelling.  Please continue taking other medications as prescribed.  Please continue to follow low salt diet and ambulate as tolerated.  Please take ferrous sulfate 325 mg once daily.

## 2022-03-16 NOTE — Progress Notes (Unsigned)
Established Patient Office Visit  Subjective:  Patient ID: Kathy [name], female    DOB: 1940/11/20  Age: 81 y.o. MRN: [id number]  CC:  Chief Complaint  Patient presents with   Follow-up    6 month follow up HTN and hypothyroidism pt is retaining fluid in feet and legs is on fluid pills was told can take two previously but will run out before time if she takes two    HPI Kathy [name] is a 81 y.o. female with past medical history of HTN, RA, hypothyroidism, osteoporosis, HLD, anxiety and polyarthritis who presents for f/u of her chronic medical conditions.  HTN: BP is well-controlled. Takes medications regularly. Patient denies headache, dizziness, chest pain, dyspnea or palpitations.  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  She does not have LAFB according to cardiology eval. she has had leg swelling, for which she was told to take Lasix 40 mg instead of 20 mg by cardiology provider.  Her leg swelling has improved with Lasix 40 mg QD PRN.   Hypothyroidism: Followed by Dr. Chalmers Cater.  She states that her recent blood tests at her office was WNL.  She continues to take levothyroxine 88 mcg daily.  Denies any recent change in weight or appetite.  She has a h/o RA, for which she has been getting Humira. She follows up with Rheumatologist. She c/o chronic pain and morning stiffness over b/l hands. She also c/o left knee pain, where she has had TKA for OA. She also has had shoulder and b/l hip replacement.  She also had DEXA scan recently, which was better.  She is on Prolia for it currently.  She takes Xanax for anxiety, but does not have to take it daily.    Past Medical History:  Diagnosis Date   Abnormal findings on diagnostic imaging of heart and coronary circulation    Anemia    FROM BLEEDING ULCER   Anxiety    takes Alprazolam daily as needed   Arthritis    dx with RA 2017   Bariatric surgery status    Cataract    Diverticulosis     Gastro-esophageal reflux disease with esophagitis    Gastrojejunal ulcer with hemorrhage    Headache(784.0)    occasionally   History of blood transfusion    no abnormal reaction noted   History of bronchitis    last time many yrs ago   Hyperlipidemia    PT DENIES THIS DX -  ON NO MEDS AND NO ONE HAS TOLD HER   Hypertension    takes Amlodipine daily   Hypothyroidism    takes Synthroid daily   Insomnia    Joint pain    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   Localized edema    Nocturia    Numbness    occasionally left arm at night   Obesity    Osteoporosis    takes Fosamax weekly   Pain in joint involving pelvic region and thigh    Peripheral edema    takes Lasix daily as needed   PONV (postoperative nausea and vomiting)    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/2017   Rheumatoid arthritis, unspecified ([place])    Rotator cuff arthropathy, 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   Sleep apnea    Phreesia 08/22/2020   Stomach ulcer    Thyroid disease    Phreesia 08/22/2020   Unspecified injury of muscle(s) and tendon(s) of the rotator cuff of left shoulder, subsequent  encounter    Unspecified osteoarthritis, unspecified site    Wears glasses    Wears partial dentures     Past Surgical History:  Procedure Laterality Date   ABDOMINAL HYSTERECTOMY     partial   cataract surgery Bilateral    CHOLECYSTECTOMY     COLONOSCOPY     ESOPHAGOGASTRODUODENOSCOPY (EGD) WITH PROPOFOL N/A 01/11/2022   Procedure: ESOPHAGOGASTRODUODENOSCOPY (EGD) WITH PROPOFOL;  Surgeon: Gatha Mayer, MD;  Location: [place];  Service: Gastroenterology;  Laterality: N/A;   EYE SURGERY     CATARACTS BOTH   gastric bypass surgery     HOT HEMOSTASIS N/A 01/11/2022   Procedure: HOT HEMOSTASIS (ARGON PLASMA COAGULATION/BICAP);  Surgeon: Gatha Mayer, MD;  Location: [place];  Service: Gastroenterology;  Laterality: N/A;   JOINT REPLACEMENT Bilateral    hip   REVERSE SHOULDER ARTHROPLASTY Right  05/11/2016   Procedure: REVERSE SHOULDER ARTHROPLASTY;  Surgeon: Marchia Bond, MD;  Location: [place];  Service: Orthopedics;  Laterality: Right;   REVISION TOTAL HIP ARTHROPLASTY Left 10/03/2013   DR Mayer Camel   ROTATOR CUFF REPAIR     SCLEROTHERAPY  01/11/2022   Procedure: Clide Deutscher;  Surgeon: Gatha Mayer, MD;  Location[facility] ENDOSCOPY;  Service: Gastroenterology;;   TOTAL HIP REVISION Left 10/03/2013   Procedure: TOTAL HIP REVISION- left;  Surgeon: Kerin Salen, MD;  Location: [place];  Service: Orthopedics;  Laterality: Left;   TOTAL KNEE ARTHROPLASTY Left 11/29/2017   Procedure: LEFT TOTAL KNEE ARTHROPLASTY;  Surgeon: Marchia Bond, MD;  Location: [place];  Service: Orthopedics;  Laterality: Left;   TOTAL SHOULDER ARTHROPLASTY Left 11/09/2016   Procedure: TOTAL REVERSE SHOULDER ARTHROPLASTY;  Surgeon: Marchia Bond, MD;  Location: [place];  Service: Orthopedics;  Laterality: Left;   TOTAL SHOULDER REPLACEMENT Left 10/2016   TOTAL THYROIDECTOMY      Family History  Problem Relation Age of Onset   Diabetes Mother    Congestive Heart Failure Mother    Lung cancer Father    Thrombosis Sister    CAD Brother        CABG    Social History   Socioeconomic History   Marital status: Divorced    Spouse name: Not on file   Number of children: Not on file   Years of education: Not on file   Highest education level: Not on file  Occupational History   Occupation: disabled  Tobacco Use   Smoking status: Never   Smokeless tobacco: Never  Vaping Use   Vaping Use: Never used  Substance and Sexual Activity   Alcohol use: No   Drug use: No   Sexual activity: Not Currently    Birth control/protection: Surgical  Other Topics Concern   Not on file  Social History Narrative   Not on file   Social Determinants of Health   Financial Resource Strain: Low Risk  (09/14/2021)   Overall Financial Resource Strain (CARDIA)    Difficulty of Paying Living Expenses: Not hard at all  Food Insecurity:  No Food Insecurity (09/14/2021)   Hunger Vital Sign    Worried About Running Out of Food in the Last Year: Never true    [place] in the Last Year: Never true  Transportation Needs: No Transportation Needs (09/14/2021)   PRAPARE - Hydrologist (Medical): No    Lack of Transportation (Non-Medical): No  Physical Activity: Sufficiently Active (09/14/2021)   Exercise Vital Sign    Days of Exercise per  Week: 6 days    Minutes of Exercise per Session: 40 min  Stress: No Stress Concern Present (09/14/2021)   [place]    Feeling of Stress : Not at all  Social Connections: Moderately Isolated (09/14/2021)   Social Connection and Isolation Panel [NHANES]    Frequency of Communication with Friends and Family: More than three times a week    Frequency of Social Gatherings with Friends and Family: More than three times a week    Attends Religious Services: More than 4 times per year    Active Member of [employer] or Organizations: No    Attends Archivist Meetings: Never    Marital Status: Divorced  Human resources officer Violence: Not At Risk (09/14/2021)   Humiliation, Afraid, Rape, and Kick questionnaire    Fear of Current or Ex-Partner: No    Emotionally Abused: No    Physically Abused: No    Sexually Abused: No    Outpatient Medications Prior to Visit  Medication Sig Dispense Refill   acetaminophen (TYLENOL) 650 MG CR tablet Take 1,300 mg by mouth at bedtime.     Adalimumab (HUMIRA PEN) 40 MG/0.4ML PNKT Inject 40 mg into the skin every Saturday.     ALPRAZolam (XANAX) 0.25 MG tablet Take 0.25 mg by mouth at bedtime as needed for anxiety or sleep.     amLODipine (NORVASC) 5 MG tablet TAKE (1) TABLET BY MOUTH ONCE DAILY. 90 tablet 0   Calcium Carbonate-Vitamin D (CALCIUM 600+D) 600-5 MG-MCG TABS Take 0.5-1 tablets by mouth See admin instructions. Take one tablet by mouth twice daily - with  breakfast and supper; take 1/2 tablet with lunch     Cholecalciferol (VITAMIN D3) 50 MCG (2000 UT) TABS Take 2,000 Units by mouth every morning.     denosumab (PROLIA) 60 MG/ML SOSY injection 38m Subcutaneous every 6 months 180 days (Patient taking differently: Inject 60 mg into the skin every 6 (six) months.) 1 mL 1   diclofenac Sodium (VOLTAREN) 1 % GEL APPLY 2 GRAMS TO AFFECTED AREA FOUR TIMES A DAY. (Patient taking differently: Apply 2 g topically at bedtime. Apply to hands, wrists, neck) 1050 g 3   Multiple Vitamin (MULTIVITAMIN WITH MINERALS) TABS tablet Take 1 tablet by mouth every morning.     Omega-3 Fatty Acids (FISH OIL) 500 MG CAPS Take 500 mg by mouth every morning.     oxycodone-acetaminophen (LYNOX) 2.5-300 MG per tablet Take 1 tablet by mouth at bedtime as needed for pain.     Polyethyl Glycol-Propyl Glycol (SYSTANE) 0.4-0.3 % SOLN Place 1 drop into both eyes 2 (two) times daily.     potassium chloride SA (KLOR-CON M) 20 MEQ tablet TAKE 1 TABLET (20 MEQ) BY MOUTH DAILY PRN- EDEMA (Patient taking differently: Take 20 mEq by mouth daily as needed (with each dose of Lasix).) 90 tablet 3   traMADol (ULTRAM) 50 MG tablet Take 50 mg by mouth every morning.     famotidine (PEPCID) 40 MG tablet TAKE ONE TABLET BY MOUTH ONCE DAILY. 30 tablet 0   ferrous sulfate 325 (65 FE) MG EC tablet Take 1 tablet (325 mg total) by mouth daily. 60 tablet 3   furosemide (LASIX) 20 MG tablet Take 1 tablet (20 mg total) by mouth daily. 90 tablet 3   levothyroxine (SYNTHROID) 88 MCG tablet Take 1 tablet (88 mcg total) by mouth daily. (Patient taking differently: Take 88 mcg by mouth daily before breakfast.) 90 tablet 1  omeprazole (PRILOSEC) 40 MG capsule Take 1 capsule (40 mg total) by mouth 2 (two) times daily. 60 capsule 1   sucralfate (CARAFATE) 1 g tablet Take 1 tablet (1 g total) by mouth 4 (four) times daily. (Patient taking differently: Take 1 g by mouth 2 (two) times daily.) 120 tablet 0   No  facility-administered medications prior to visit.    Allergies  Allergen Reactions   Aspirin Nausea And Vomiting and Other (See Comments)    Adult Aspirin: upset stomach / irritates ulcer    Macrobid [Nitrofurantoin] Other (See Comments)    Stomach pain    ROS Review of Systems  Constitutional:  Negative for chills and fever.  HENT:  Negative for congestion, sinus pressure, sinus pain and sore throat.   Eyes:  Negative for pain and discharge.  Respiratory:  Negative for cough and shortness of breath.   Cardiovascular:  Negative for chest pain and palpitations.  Gastrointestinal:  Negative for abdominal pain, constipation, diarrhea, nausea and vomiting.  Endocrine: Negative for polydipsia and polyuria.  Genitourinary:  Negative for dysuria and hematuria.  Musculoskeletal:  Positive for arthralgias and back pain. Negative for neck pain and neck stiffness.  Skin:  Negative for rash.  Neurological:  Negative for dizziness and weakness.  Psychiatric/Behavioral:  Negative for agitation and behavioral problems.       Objective:    Physical Exam Vitals reviewed.  Constitutional:      General: She is not in acute distress.    Appearance: She is not diaphoretic.  HENT:     Head: Normocephalic and atraumatic.     Nose: Nose normal.     Mouth/Throat:     Mouth: Mucous membranes are moist.  Eyes:     General: No scleral icterus.    Extraocular Movements: Extraocular movements intact.     Pupils: Pupils are equal, round, and reactive to light.  Cardiovascular:     Rate and Rhythm: Normal rate and regular rhythm.     Pulses: Normal pulses.     Heart sounds: Normal heart sounds. No murmur heard. Pulmonary:     Breath sounds: Normal breath sounds. No wheezing or rales.  Musculoskeletal:        General: Tenderness (Over MCPs and ICPs over b/l UE) and deformity (Ulnar deviation of b/l hands) present.     Cervical back: Neck supple. No tenderness.     Right lower leg: Edema (Mild)  present.     Left lower leg: Edema (Mild) present.  Skin:    General: Skin is warm.     Findings: No rash.  Neurological:     General: No focal deficit present.     Mental Status: She is alert and oriented to person, place, and time.     Cranial Nerves: No cranial nerve deficit.     Sensory: No sensory deficit.     Motor: No weakness.  Psychiatric:        Mood and Affect: Mood normal.        Behavior: Behavior normal.     BP 118/80 (BP Location: Left Arm, Patient Position: Sitting, Cuff Size: Normal)   Pulse 72   Resp 18   Ht 5' 1.5" (1.562 m)   Wt 163 lb (73.9 kg)   SpO2 96%   BMI 30.30 kg/m  Wt Readings from Last 3 Encounters:  03/17/22 162 lb (73.5 kg)  03/16/22 163 lb (73.9 kg)  03/03/22 168 lb (76.2 kg)    Lab Results  Component Value  Date   TSH 0.249 (L) 03/16/2022   Lab Results  Component Value Date   WBC 4.5 03/16/2022   HGB 10.4 (L) 03/16/2022   HCT 32.3 (L) 03/16/2022   MCV 96 03/16/2022   PLT 230 03/16/2022   Lab Results  Component Value Date   NA 140 03/16/2022   K 4.0 03/16/2022   CO2 24 03/16/2022   GLUCOSE 81 03/16/2022   BUN 13 03/16/2022   CREATININE 0.77 03/16/2022   BILITOT 0.9 01/10/2022   ALKPHOS 35 (L) 01/10/2022   AST 23 01/10/2022   ALT 14 01/10/2022   PROT 7.0 01/10/2022   ALBUMIN 2.7 (L) 01/10/2022   CALCIUM 9.6 03/16/2022   ANIONGAP 6 01/11/2022   EGFR 78 03/16/2022   Lab Results  Component Value Date   CHOL 134 03/16/2022   Lab Results  Component Value Date   HDL 57 03/16/2022   Lab Results  Component Value Date   LDLCALC 64 03/16/2022   Lab Results  Component Value Date   TRIG 59 03/16/2022   Lab Results  Component Value Date   CHOLHDL 2.4 03/16/2022   Lab Results  Component Value Date   HGBA1C 5.1 05/23/2017      Assessment & Plan:   Problem List Items Addressed This Visit       Cardiovascular and Mediastinum   Hypertension - Primary    BP Readings from Last 1 Encounters:  03/17/22 102/60   Well-controlled with Amlodipine Counseled for compliance with the medications Advised DASH diet and moderate exercise/walking, at least 150 mins/week      Relevant Medications   furosemide (LASIX) 20 MG tablet   Other Relevant Orders   CBC with Differential/Platelet (Completed)     Digestive   GERD (gastroesophageal reflux disease)    On omeprazole again Recent upper GI bleeding due to gastrojejunal ulcer Followed by GI      Gastrojejunal ulcer with hemorrhage    On Omeprazole now Followed by GI        Endocrine   Hypothyroid    Lab Results  Component Value Date   TSH 0.320 (L) 03/18/2021  TSH borderline low, no symptoms of overcorrection currently On Levothyroxine 88 mcg QD Check TSH and free T4 in the next visit      Relevant Orders   CBC with Differential/Platelet (Completed)   TSH + free T4 (Completed)   Basic Metabolic Panel (BMET) (Completed)     Other   Hyperlipidemia    Last lipid profile reviewed On Omega-3      Relevant Medications   furosemide (LASIX) 20 MG tablet   Other Relevant Orders   Lipid Profile   Leg edema    On Lasix 20 mg QD, advised to take additional dose if she has severe leg swelling Cardiology visit note reviewed      Relevant Medications   furosemide (LASIX) 20 MG tablet    Meds ordered this encounter  Medications   furosemide (LASIX) 20 MG tablet    Sig: Take 1 tablet (20 mg total) by mouth 2 (two) times daily.    Dispense:  180 tablet    Refill:  3    Dose change    Follow-up: Return in about 4 months (around 07/16/2022) for HTN and hypothyroidism.    Lindell Spar, MD

## 2022-03-16 NOTE — Assessment & Plan Note (Signed)
Lab Results  Component Value Date   TSH 0.320 (L) 03/18/2021   TSH borderline low, no symptoms of overcorrection currently On Levothyroxine 88 mcg QD Check TSH and free T4 in the next visit

## 2022-03-17 ENCOUNTER — Encounter: Payer: Self-pay | Admitting: Internal Medicine

## 2022-03-17 ENCOUNTER — Ambulatory Visit (INDEPENDENT_AMBULATORY_CARE_PROVIDER_SITE_OTHER): Payer: Medicare Other | Admitting: Internal Medicine

## 2022-03-17 ENCOUNTER — Other Ambulatory Visit: Payer: Self-pay | Admitting: Internal Medicine

## 2022-03-17 VITALS — BP 102/60 | HR 54 | Ht 61.5 in | Wt 162.0 lb

## 2022-03-17 DIAGNOSIS — K284 Chronic or unspecified gastrojejunal ulcer with hemorrhage: Secondary | ICD-10-CM | POA: Diagnosis not present

## 2022-03-17 DIAGNOSIS — R7989 Other specified abnormal findings of blood chemistry: Secondary | ICD-10-CM

## 2022-03-17 DIAGNOSIS — D62 Acute posthemorrhagic anemia: Secondary | ICD-10-CM

## 2022-03-17 DIAGNOSIS — Z9884 Bariatric surgery status: Secondary | ICD-10-CM | POA: Diagnosis not present

## 2022-03-17 DIAGNOSIS — E039 Hypothyroidism, unspecified: Secondary | ICD-10-CM

## 2022-03-17 LAB — CBC WITH DIFFERENTIAL/PLATELET
Basophils Absolute: 0 10*3/uL (ref 0.0–0.2)
Basos: 0 %
EOS (ABSOLUTE): 0 10*3/uL (ref 0.0–0.4)
Eos: 1 %
Hematocrit: 32.3 % — ABNORMAL LOW (ref 34.0–46.6)
Hemoglobin: 10.4 g/dL — ABNORMAL LOW (ref 11.1–15.9)
Immature Grans (Abs): 0 10*3/uL (ref 0.0–0.1)
Immature Granulocytes: 0 %
Lymphocytes Absolute: 2.8 10*3/uL (ref 0.7–3.1)
Lymphs: 63 %
MCH: 30.8 pg (ref 26.6–33.0)
MCHC: 32.2 g/dL (ref 31.5–35.7)
MCV: 96 fL (ref 79–97)
Monocytes Absolute: 0.5 10*3/uL (ref 0.1–0.9)
Monocytes: 10 %
Neutrophils Absolute: 1.1 10*3/uL — ABNORMAL LOW (ref 1.4–7.0)
Neutrophils: 26 %
Platelets: 230 10*3/uL (ref 150–450)
RBC: 3.38 x10E6/uL — ABNORMAL LOW (ref 3.77–5.28)
RDW: 14.5 % (ref 11.7–15.4)
WBC: 4.5 10*3/uL (ref 3.4–10.8)

## 2022-03-17 LAB — BASIC METABOLIC PANEL
BUN/Creatinine Ratio: 17 (ref 12–28)
BUN: 13 mg/dL (ref 8–27)
CO2: 24 mmol/L (ref 20–29)
Calcium: 9.6 mg/dL (ref 8.7–10.3)
Chloride: 101 mmol/L (ref 96–106)
Creatinine, Ser: 0.77 mg/dL (ref 0.57–1.00)
Glucose: 81 mg/dL (ref 70–99)
Potassium: 4 mmol/L (ref 3.5–5.2)
Sodium: 140 mmol/L (ref 134–144)
eGFR: 78 mL/min/{1.73_m2} (ref >59)

## 2022-03-17 LAB — TSH+FREE T4
Free T4: 1.34 ng/dL (ref 0.82–1.77)
TSH: 0.249 u[IU]/mL — ABNORMAL LOW (ref 0.450–4.500)

## 2022-03-17 LAB — LIPID PANEL
Chol/HDL Ratio: 2.4 ratio (ref 0.0–4.4)
Cholesterol, Total: 134 mg/dL (ref 100–199)
HDL: 57 mg/dL (ref >39)
LDL Chol Calc (NIH): 64 mg/dL (ref 0–99)
Triglycerides: 59 mg/dL (ref 0–149)
VLDL Cholesterol Cal: 13 mg/dL (ref 5–40)

## 2022-03-17 MED ORDER — LEVOTHYROXINE SODIUM 75 MCG PO TABS
75.0000 ug | ORAL_TABLET | Freq: Every day | ORAL | 1 refills | Status: DC
Start: 2022-03-17 — End: 2022-06-15

## 2022-03-17 MED ORDER — FERROUS SULFATE 325 (65 FE) MG PO TBEC: 325.0000 mg | DELAYED_RELEASE_TABLET | Freq: Every day | ORAL | 3 refills | Status: DC

## 2022-03-17 MED ORDER — OMEPRAZOLE 40 MG PO CPDR: 40.0000 mg | DELAYED_RELEASE_CAPSULE | Freq: Two times a day (BID) | ORAL | 2 refills | Status: DC

## 2022-03-17 NOTE — Progress Notes (Unsigned)
Kathy [name] 81 y.o. 1940-11-07 [id number]  Assessment & Plan:   Encounter Diagnoses  Name Primary?   Gastrojejunal ulcer with hemorrhage    Acute blood loss anemia Yes   Bariatric surgery status    She is improved including her anemia.  She will continue current therapy.  I have recommended she get a bariatric multivitamin where she might get better absorption.  Carafate will be stopped.   EGD in September 2 make sure this ulcer has healed.  Continue iron supplementation otherwise.  Subjective:   Chief Complaint: Follow-up of gastrojejunal ulcer  HPI 81 year old African-American woman status post bariatric surgery with gastrojejunal ulcer and hemorrhage found at EGD during hospitalization, 01/11/2022.  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  Previously followed by [facility].  Returns to clinic for follow-up reporting no problems no melena.  Remains on twice daily PPI and taking sucralfate twice a day.  When she was hospitalized she was on pantoprazole as an outpatient and this was switched to omeprazole opening the capsules for better absorption.     Latest Ref Rng & Units 03/16/2022    3:19 PM 02/19/2022    1:53 PM 01/19/2022   12:17 PM  CBC  WBC 3.4 - 10.8 x10E3/uL 4.5  4.4  5.5   Hemoglobin 11.1 - 15.9 g/dL 10.4  9.3  8.4 Repeated and verified X2.   Hematocrit 34.0 - 46.6 % 32.3  27.7  25.4   Platelets 150 - 450 x10E3/uL 230  217.0  242.0    Lab Results  Component Value Date   FERRITIN 18 01/10/2022    Allergies  Allergen Reactions   Aspirin Nausea And Vomiting and Other (See Comments)    Adult Aspirin: upset stomach / irritates ulcer    Macrobid [Nitrofurantoin] Other (See Comments)    Stomach pain   Current Meds  Medication Sig   acetaminophen (TYLENOL) 650 MG CR tablet Take 1,300 mg by mouth at bedtime.   Adalimumab (HUMIRA PEN) 40 MG/0.4ML PNKT Inject  40 mg into the skin every Saturday.   ALPRAZolam (XANAX) 0.25 MG tablet Take 0.25 mg by mouth at bedtime as needed for anxiety or sleep.   amLODipine (NORVASC) 5 MG tablet TAKE (1) TABLET BY MOUTH ONCE DAILY.   Calcium Carbonate-Vitamin D (CALCIUM 600+D) 600-5 MG-MCG TABS Take 0.5-1 tablets by mouth See admin instructions. Take one tablet by mouth twice daily - with breakfast and supper; take 1/2 tablet with lunch   Cholecalciferol (VITAMIN D3) 50 MCG (2000 UT) TABS Take 2,000 Units by mouth every morning.   denosumab (PROLIA) 60 MG/ML SOSY injection '60mg'$  Subcutaneous every 6 months 180 days (Patient taking differently: Inject 60 mg into the skin every 6 (six) months.)   diclofenac Sodium (VOLTAREN) 1 % GEL APPLY 2 GRAMS TO AFFECTED AREA FOUR TIMES A DAY. (Patient taking differently: Apply 2 g topically at bedtime. Apply to hands, wrists, neck)   furosemide (LASIX) 20 MG tablet Take 1 tablet (20 mg total) by mouth 2 (two) times daily.   levothyroxine (SYNTHROID) 75 MCG tablet Take 1 tablet (75 mcg total) by mouth daily.   Multiple Vitamin (MULTIVITAMIN WITH MINERALS) TABS tablet Take 1 tablet by mouth every morning.   Omega-3 Fatty Acids (FISH OIL) 500 MG CAPS Take 500 mg by mouth every morning.   oxycodone-acetaminophen (LYNOX) 2.5-300 MG per tablet Take 1 tablet by mouth at bedtime as needed for pain.  Polyethyl Glycol-Propyl Glycol (SYSTANE) 0.4-0.3 % SOLN Place 1 drop into both eyes 2 (two) times daily.   potassium chloride SA (KLOR-CON M) 20 MEQ tablet TAKE 1 TABLET (20 MEQ) BY MOUTH DAILY PRN- EDEMA (Patient taking differently: Take 20 mEq by mouth daily as needed (with each dose of Lasix).)   traMADol (ULTRAM) 50 MG tablet Take 50 mg by mouth every morning.    ferrous sulfate 325 (65 FE) MG EC tablet Take 1 tablet (325 mg total) by mouth daily.   omeprazole (PRILOSEC) 40 MG capsule Take 1 capsule (40 mg total) by mouth 2 (two) times daily.   sucralfate (CARAFATE) 1 g tablet Take 1 tablet (1  g total) by mouth 4 (four) times daily. (Patient taking differently: Take 1 g by mouth 2 (two) times daily.)   Past Medical History:  Diagnosis Date   Abnormal findings on diagnostic imaging of heart and coronary circulation    Anemia    FROM BLEEDING ULCER   Anxiety    takes Alprazolam daily as needed   Arthritis    dx with RA 2017   Bariatric surgery status    Cataract    Diverticulosis    Gastro-esophageal reflux disease with esophagitis    Gastrojejunal ulcer with hemorrhage    Headache(784.0)    occasionally   History of blood transfusion    no abnormal reaction noted   History of bronchitis    last time many yrs ago   Hyperlipidemia    PT DENIES THIS DX -  ON NO MEDS AND NO ONE HAS TOLD HER   Hypertension    takes Amlodipine daily   Hypothyroidism    takes Synthroid daily   Insomnia    Joint pain    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   Localized edema    Nocturia    Numbness    occasionally left arm at night   Obesity    Osteoporosis    takes Fosamax weekly   Pain in joint involving pelvic region and thigh    Peripheral edema    takes Lasix daily as needed   PONV (postoperative nausea and vomiting)    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/2017   Rheumatoid arthritis, unspecified ([facility])    Rotator cuff arthropathy, 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   Sleep apnea    Phreesia 08/22/2020   Stomach ulcer    Thyroid disease    Phreesia 08/22/2020   Unspecified injury of muscle(s) and tendon(s) of the rotator cuff of left shoulder, subsequent encounter    Unspecified osteoarthritis, unspecified site    Wears glasses    Wears partial dentures    Past Surgical History:  Procedure Laterality Date   ABDOMINAL HYSTERECTOMY     partial   cataract surgery Bilateral    CHOLECYSTECTOMY     COLONOSCOPY     ESOPHAGOGASTRODUODENOSCOPY (EGD) WITH PROPOFOL N/A 01/11/2022   Procedure: ESOPHAGOGASTRODUODENOSCOPY (EGD) WITH PROPOFOL;  Surgeon: Gatha Mayer, MD;   Location: [place];  Service: Gastroenterology;  Laterality: N/A;   EYE SURGERY     CATARACTS BOTH   gastric bypass surgery     HOT HEMOSTASIS N/A 01/11/2022   Procedure: HOT HEMOSTASIS (ARGON PLASMA COAGULATION/BICAP);  Surgeon: Gatha Mayer, MD;  Location: [place];  Service: Gastroenterology;  Laterality: N/A;   JOINT REPLACEMENT Bilateral    hip   REVERSE SHOULDER ARTHROPLASTY Right 05/11/2016   Procedure: REVERSE SHOULDER ARTHROPLASTY;  Surgeon: Marchia Bond, MD;  Location: [place];  Service: Orthopedics;  Laterality: Right;   REVISION TOTAL HIP ARTHROPLASTY Left 10/03/2013   DR Mayer Camel   ROTATOR CUFF REPAIR     SCLEROTHERAPY  01/11/2022   Procedure: Clide Deutscher;  Surgeon: Gatha Mayer, MD;  Location: [facility] ENDOSCOPY;  Service: Gastroenterology;;   TOTAL HIP REVISION Left 10/03/2013   Procedure: TOTAL HIP REVISION- left;  Surgeon: Kerin Salen, MD;  Location: [place];  Service: Orthopedics;  Laterality: Left;   TOTAL KNEE ARTHROPLASTY Left 11/29/2017   Procedure: LEFT TOTAL KNEE ARTHROPLASTY;  Surgeon: Marchia Bond, MD;  Location: [place];  Service: Orthopedics;  Laterality: Left;   TOTAL SHOULDER ARTHROPLASTY Left 11/09/2016   Procedure: TOTAL REVERSE SHOULDER ARTHROPLASTY;  Surgeon: Marchia Bond, MD;  Location: [place];  Service: Orthopedics;  Laterality: Left;   TOTAL SHOULDER REPLACEMENT Left 10/2016   TOTAL THYROIDECTOMY     Social History   Social History Narrative   Not on file   family history includes CAD in her brother; Congestive Heart Failure in her mother; Diabetes in her mother; Lung cancer in her father; Thrombosis in her sister.   Review of Systems As per HPI  Objective:   Physical Exam BP 102/60   Pulse (!) 54   Ht 5' 1.5" (1.562 m)   Wt 162 lb (73.5 kg)   BMI 30.11 kg/m  Lungs clear Normal heart sounds Ab and benign

## 2022-03-17 NOTE — Patient Instructions (Addendum)
You have been scheduled for an endoscopy. Please follow written instructions given to you at your visit today. If you use inhalers (even only as needed), please bring them with you on the day of your procedure.  We have sent the following medications to your pharmacy for you to pick up at your convenience: Omeprazole and iron  Ask your pharmacist for a Bariatric Multiple Vitamin with Iron.  Take your iron and the multiple vitamin long term.  I appreciate the opportunity to care for you. Silvano Rusk, MD, [facility]

## 2022-03-18 ENCOUNTER — Telehealth: Payer: Self-pay

## 2022-03-18 ENCOUNTER — Telehealth: Payer: Self-pay | Admitting: Internal Medicine

## 2022-03-18 NOTE — Telephone Encounter (Signed)
I told her she could stop that

## 2022-03-18 NOTE — Assessment & Plan Note (Signed)
On Lasix 20 mg QD, advised to take additional dose if she has severe leg swelling Cardiology visit note reviewed

## 2022-03-18 NOTE — Telephone Encounter (Signed)
Patient returning lab result call

## 2022-03-18 NOTE — Assessment & Plan Note (Signed)
Last lipid profile reviewed On Omega-3

## 2022-03-18 NOTE — Assessment & Plan Note (Signed)
On omeprazole again Recent upper GI bleeding due to gastrojejunal ulcer Followed by GI

## 2022-03-18 NOTE — Telephone Encounter (Signed)
Inbound call from patient stating that she needs a refill for sucralfate 1 g. Please advise.

## 2022-03-18 NOTE — Assessment & Plan Note (Signed)
On Omeprazole now Followed by GI

## 2022-03-18 NOTE — Assessment & Plan Note (Signed)
BP Readings from Last 1 Encounters:  03/17/22 102/60   Well-controlled with Amlodipine Counseled for compliance with the medications Advised DASH diet and moderate exercise/walking, at least 150 mins/week

## 2022-03-18 NOTE — Telephone Encounter (Signed)
Seen yesterday Sir, please advise.

## 2022-03-18 NOTE — Telephone Encounter (Signed)
Patient advised with verbal understanding  

## 2022-03-18 NOTE — Telephone Encounter (Signed)
I told [name] and she said thank you.

## 2022-03-24 DIAGNOSIS — M79675 Pain in left toe(s): Secondary | ICD-10-CM | POA: Diagnosis not present

## 2022-03-24 DIAGNOSIS — L110 Acquired keratosis follicularis: Secondary | ICD-10-CM | POA: Diagnosis not present

## 2022-03-24 DIAGNOSIS — M79674 Pain in right toe(s): Secondary | ICD-10-CM | POA: Diagnosis not present

## 2022-03-24 DIAGNOSIS — M79671 Pain in right foot: Secondary | ICD-10-CM | POA: Diagnosis not present

## 2022-03-24 DIAGNOSIS — I739 Peripheral vascular disease, unspecified: Secondary | ICD-10-CM | POA: Diagnosis not present

## 2022-03-24 DIAGNOSIS — M79672 Pain in left foot: Secondary | ICD-10-CM | POA: Diagnosis not present

## 2022-04-02 ENCOUNTER — Other Ambulatory Visit (HOSPITAL_COMMUNITY): Payer: Self-pay

## 2022-04-06 ENCOUNTER — Other Ambulatory Visit (HOSPITAL_COMMUNITY): Payer: Self-pay

## 2022-04-07 ENCOUNTER — Other Ambulatory Visit (HOSPITAL_COMMUNITY): Payer: Self-pay

## 2022-04-12 DIAGNOSIS — M79643 Pain in unspecified hand: Secondary | ICD-10-CM | POA: Diagnosis not present

## 2022-04-12 DIAGNOSIS — M545 Low back pain, unspecified: Secondary | ICD-10-CM | POA: Diagnosis not present

## 2022-04-12 DIAGNOSIS — G5600 Carpal tunnel syndrome, unspecified upper limb: Secondary | ICD-10-CM | POA: Diagnosis not present

## 2022-04-12 DIAGNOSIS — Z79891 Long term (current) use of opiate analgesic: Secondary | ICD-10-CM | POA: Diagnosis not present

## 2022-04-12 DIAGNOSIS — M25569 Pain in unspecified knee: Secondary | ICD-10-CM | POA: Diagnosis not present

## 2022-04-13 ENCOUNTER — Ambulatory Visit (INDEPENDENT_AMBULATORY_CARE_PROVIDER_SITE_OTHER): Payer: Medicare Other | Admitting: Podiatry

## 2022-04-13 DIAGNOSIS — L600 Ingrowing nail: Secondary | ICD-10-CM | POA: Diagnosis not present

## 2022-04-13 NOTE — Progress Notes (Signed)
Subjective:  Patient ID: Kathy [name], female    DOB: [birth year],  MRN: [id number]  Chief Complaint  Patient presents with   Nail Problem    Rt hallux nail sore     81 y.o. female presents with the above complaint.  Patient presents with complaint of right hallux medial border ingrown.  Painful to touch is progressive gotten worse.  She has not seen anyone else prior to seeing me.  She would like to have it removed she states the pain scale is 5 out of 10 hurts with ambulation hurts with pressure.   Review of Systems: Negative except as noted in the HPI. Denies N/V/F/Ch.  Past Medical History:  Diagnosis Date   Abnormal findings on diagnostic imaging of heart and coronary circulation    Anemia    FROM BLEEDING ULCER   Anxiety    takes Alprazolam daily as needed   Arthritis    dx with RA 2017   Bariatric surgery status    Cataract    Diverticulosis    Gastro-esophageal reflux disease with esophagitis    Gastrojejunal ulcer with hemorrhage    Headache(784.0)    occasionally   History of blood transfusion    no abnormal reaction noted   History of bronchitis    last time many yrs ago   Hyperlipidemia    PT DENIES THIS DX -  ON NO MEDS AND NO ONE HAS TOLD HER   Hypertension    takes Amlodipine daily   Hypothyroidism    takes Synthroid daily   Insomnia    Joint pain    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   Localized edema    Nocturia    Numbness    occasionally left arm at night   Obesity    Osteoporosis    takes Fosamax weekly   Pain in joint involving pelvic region and thigh    Peripheral edema    takes Lasix daily as needed   PONV (postoperative nausea and vomiting)    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/2017   Rheumatoid arthritis, unspecified ([place])    Rotator cuff arthropathy, 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   Sleep apnea    Phreesia 08/22/2020   Stomach ulcer    Thyroid disease    Phreesia 08/22/2020   Unspecified injury of muscle(s)  and tendon(s) of the rotator cuff of left shoulder, subsequent encounter    Unspecified osteoarthritis, unspecified site    Wears glasses    Wears partial dentures     Current Outpatient Medications:    acetaminophen (TYLENOL) 650 MG CR tablet, Take 1,300 mg by mouth at bedtime., Disp: , Rfl:    Adalimumab (HUMIRA PEN) 40 MG/0.4ML PNKT, Inject 40 mg into the skin every Saturday., Disp: , Rfl:    ALPRAZolam (XANAX) 0.25 MG tablet, Take 0.25 mg by mouth at bedtime as needed for anxiety or sleep., Disp: , Rfl:    amLODipine (NORVASC) 5 MG tablet, TAKE (1) TABLET BY MOUTH ONCE DAILY., Disp: 90 tablet, Rfl: 0   Calcium Carbonate-Vitamin D (CALCIUM 600+D) 600-5 MG-MCG TABS, Take 0.5-1 tablets by mouth See admin instructions. Take one tablet by mouth twice daily - with breakfast and supper; take 1/2 tablet with lunch, Disp: , Rfl:    Cholecalciferol (VITAMIN D3) 50 MCG (2000 UT) TABS, Take 2,000 Units by mouth every morning., Disp: , Rfl:    denosumab (PROLIA) 60 MG/ML SOSY injection, '60mg'$  Subcutaneous every 6 months 180 days (Patient taking differently: Inject 60 mg into  the skin every 6 (six) months.), Disp: 1 mL, Rfl: 1   diclofenac Sodium (VOLTAREN) 1 % GEL, APPLY 2 GRAMS TO AFFECTED AREA FOUR TIMES A DAY. (Patient taking differently: Apply 2 g topically at bedtime. Apply to hands, wrists, neck), Disp: 1050 g, Rfl: 3   ferrous sulfate 325 (65 FE) MG EC tablet, Take 1 tablet (325 mg total) by mouth daily., Disp: 60 tablet, Rfl: 3   furosemide (LASIX) 20 MG tablet, Take 1 tablet (20 mg total) by mouth 2 (two) times daily., Disp: 180 tablet, Rfl: 3   levothyroxine (SYNTHROID) 75 MCG tablet, Take 1 tablet (75 mcg total) by mouth daily., Disp: 90 tablet, Rfl: 1   Multiple Vitamin (MULTIVITAMIN WITH MINERALS) TABS tablet, Take 1 tablet by mouth every morning., Disp: , Rfl:    Omega-3 Fatty Acids (FISH OIL) 500 MG CAPS, Take 500 mg by mouth every morning., Disp: , Rfl:    omeprazole (PRILOSEC) 40 MG  capsule, Take 1 capsule (40 mg total) by mouth 2 (two) times daily., Disp: 60 capsule, Rfl: 2   oxycodone-acetaminophen (LYNOX) 2.5-300 MG per tablet, Take 1 tablet by mouth at bedtime as needed for pain., Disp: , Rfl:    Polyethyl Glycol-Propyl Glycol (SYSTANE) 0.4-0.3 % SOLN, Place 1 drop into both eyes 2 (two) times daily., Disp: , Rfl:    potassium chloride SA (KLOR-CON M) 20 MEQ tablet, TAKE 1 TABLET (20 MEQ) BY MOUTH DAILY PRN- EDEMA (Patient taking differently: Take 20 mEq by mouth daily as needed (with each dose of Lasix).), Disp: 90 tablet, Rfl: 3   traMADol (ULTRAM) 50 MG tablet, Take 50 mg by mouth every morning., Disp: , Rfl:   Social History   Tobacco Use  Smoking Status Never  Smokeless Tobacco Never    Allergies  Allergen Reactions   Aspirin Nausea And Vomiting and Other (See Comments)    Adult Aspirin: upset stomach / irritates ulcer    Macrobid [Nitrofurantoin] Other (See Comments)    Stomach pain   Objective:  There were no vitals filed for this visit. There is no height or weight on file to calculate BMI. Constitutional Well developed. Well nourished.  Vascular Dorsalis pedis pulses palpable bilaterally. Posterior tibial pulses palpable bilaterally. Capillary refill normal to all digits.  No cyanosis or clubbing noted. Pedal hair growth normal.  Neurologic Normal speech. Oriented to person, place, and time. Epicritic sensation to light touch grossly present bilaterally.  Dermatologic Painful ingrowing nail at medial nail borders of the hallux nail right. No other open wounds. No skin lesions.  Orthopedic: Normal joint ROM without pain or crepitus bilaterally. No visible deformities. No bony tenderness.   Radiographs: None Assessment:   1. Ingrown toenail of right foot    Plan:  Patient was evaluated and treated and all questions answered.  Ingrown Nail, right -Patient elects to proceed with minor surgery to remove ingrown toenail removal today.  Consent reviewed and signed by patient. -Ingrown nail excised. See procedure note. -Educated on post-procedure care including soaking. Written instructions provided and reviewed. -Patient to follow up in 2 weeks for nail check.  Procedure: Excision of Ingrown Toenail Location: Right 1st toe medial nail borders. Anesthesia: Lidocaine 1% plain; 1.5 mL and Marcaine 0.5% plain; 1.5 mL, digital block. Skin Prep: Betadine. Dressing: Silvadene; telfa; dry, sterile, compression dressing. Technique: Following skin prep, the toe was exsanguinated and a tourniquet was secured at the base of the toe. The affected nail border was freed, split with a nail splitter, and  excised. Chemical matrixectomy was then performed with phenol and irrigated out with alcohol. The tourniquet was then removed and sterile dressing applied. Disposition: Patient tolerated procedure well. Patient to return in 2 weeks for follow-up.   No follow-ups on file.

## 2022-04-14 DIAGNOSIS — R5383 Other fatigue: Secondary | ICD-10-CM | POA: Diagnosis not present

## 2022-04-14 DIAGNOSIS — M0609 Rheumatoid arthritis without rheumatoid factor, multiple sites: Secondary | ICD-10-CM | POA: Diagnosis not present

## 2022-04-14 DIAGNOSIS — Z79899 Other long term (current) drug therapy: Secondary | ICD-10-CM | POA: Diagnosis not present

## 2022-04-14 DIAGNOSIS — M1991 Primary osteoarthritis, unspecified site: Secondary | ICD-10-CM | POA: Diagnosis not present

## 2022-04-14 NOTE — Progress Notes (Signed)
[facility] History and Physical   Primary Care Physician:  Lindell Spar, MD   Reason for Procedure:   F/u gastrojejunal ulcer  Plan:    EGD     HPI: Kathy [name] is a 81 y.o. female w/ gastrojejunal ulcer diagnosed at EGD 12/2021 - here for reassessment. H pylori IgG negative   Past Medical History:  Diagnosis Date   Abnormal findings on diagnostic imaging of heart and coronary circulation    Anemia    FROM BLEEDING ULCER   Anxiety    takes Alprazolam daily as needed   Arthritis    dx with RA 2017   Bariatric surgery status    Cataract    Diverticulosis    Gastro-esophageal reflux disease with esophagitis    Gastrojejunal ulcer with hemorrhage    Headache(784.0)    occasionally   History of blood transfusion    no abnormal reaction noted   History of bronchitis    last time many yrs ago   Hyperlipidemia    PT DENIES THIS DX -  ON NO MEDS AND NO ONE HAS TOLD HER   Hypertension    takes Amlodipine daily   Hypothyroidism    takes Synthroid daily   Insomnia    Joint pain    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   Localized edema    Nocturia    Numbness    occasionally left arm at night   Obesity    Osteoporosis    takes Fosamax weekly   Pain in joint involving pelvic region and thigh    Peripheral edema    takes Lasix daily as needed   PONV (postoperative nausea and vomiting)    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/2017   Rheumatoid arthritis, unspecified ([facility])    Rotator cuff arthropathy, 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   Sleep apnea    Phreesia 08/22/2020   Stomach ulcer    Thyroid disease    Phreesia 08/22/2020   Unspecified injury of muscle(s) and tendon(s) of the rotator cuff of left shoulder, subsequent encounter    Unspecified osteoarthritis, unspecified site    Wears glasses    Wears partial dentures     Past Surgical History:  Procedure Laterality Date   ABDOMINAL HYSTERECTOMY     partial   cataract surgery  Bilateral    CHOLECYSTECTOMY     COLONOSCOPY     ESOPHAGOGASTRODUODENOSCOPY (EGD) WITH PROPOFOL N/A 01/11/2022   Procedure: ESOPHAGOGASTRODUODENOSCOPY (EGD) WITH PROPOFOL;  Surgeon: Gatha Mayer, MD;  Location: [place];  Service: Gastroenterology;  Laterality: N/A;   EYE SURGERY     CATARACTS BOTH   gastric bypass surgery     HOT HEMOSTASIS N/A 01/11/2022   Procedure: HOT HEMOSTASIS (ARGON PLASMA COAGULATION/BICAP);  Surgeon: Gatha Mayer, MD;  Location: [place];  Service: Gastroenterology;  Laterality: N/A;   JOINT REPLACEMENT Bilateral    hip   REVERSE SHOULDER ARTHROPLASTY Right 05/11/2016   Procedure: REVERSE SHOULDER ARTHROPLASTY;  Surgeon: Marchia Bond, MD;  Location: [place];  Service: Orthopedics;  Laterality: Right;   REVISION TOTAL HIP ARTHROPLASTY Left 10/03/2013   DR Mayer Camel   ROTATOR CUFF REPAIR     SCLEROTHERAPY  01/11/2022   Procedure: Clide Deutscher;  Surgeon: Gatha Mayer, MD;  Location: [facility];  Service: Gastroenterology;;   TOTAL HIP REVISION Left 10/03/2013   Procedure: TOTAL HIP REVISION- left;  Surgeon: Kerin Salen, MD;  Location: [place];  Service: Orthopedics;  Laterality: Left;   TOTAL KNEE  ARTHROPLASTY Left 11/29/2017   Procedure: LEFT TOTAL KNEE ARTHROPLASTY;  Surgeon: Marchia Bond, MD;  Location: [place];  Service: Orthopedics;  Laterality: Left;   TOTAL SHOULDER ARTHROPLASTY Left 11/09/2016   Procedure: TOTAL REVERSE SHOULDER ARTHROPLASTY;  Surgeon: Marchia Bond, MD;  Location: [place];  Service: Orthopedics;  Laterality: Left;   TOTAL SHOULDER REPLACEMENT Left 10/2016   TOTAL THYROIDECTOMY     UPPER GASTROINTESTINAL ENDOSCOPY      Prior to Admission medications   Medication Sig Start Date End Date Taking? Authorizing Provider  acetaminophen (TYLENOL) 650 MG CR tablet Take 1,300 mg by mouth at bedtime.    [provider]  Adalimumab (HUMIRA PEN) 40 MG/0.4ML PNKT Inject 40 mg into the skin every Saturday.    [provider]  ALPRAZolam Duanne Moron) 0.25 MG tablet Take 0.25 mg by mouth at bedtime as needed for anxiety or sleep.    [provider]  amLODipine (NORVASC) 5 MG tablet TAKE (1) TABLET BY MOUTH ONCE DAILY. 01/22/22   Lindell Spar, MD  Calcium Carbonate-Vitamin D (CALCIUM 600+D) 600-5 MG-MCG TABS Take 0.5-1 tablets by mouth See admin instructions. Take one tablet by mouth twice daily - with breakfast and supper; take 1/2 tablet with lunch    [provider]  Cholecalciferol (VITAMIN D3) 50 MCG (2000 UT) TABS Take 2,000 Units by mouth every morning.    [provider]  denosumab (PROLIA) 60 MG/ML SOSY injection '60mg'$  Subcutaneous every 6 months 180 days Patient taking differently: Inject 60 mg into the skin every 6 (six) months. 10/15/21     diclofenac Sodium (VOLTAREN) 1 % GEL APPLY 2 GRAMS TO AFFECTED AREA FOUR TIMES A DAY. Patient taking differently: Apply 2 g topically at bedtime. Apply to hands, wrists, neck 03/16/21   Lindell Spar, MD  ferrous sulfate 325 (65 FE) MG EC tablet Take 1 tablet (325 mg total) by mouth daily. 03/17/22   Gatha Mayer, MD  furosemide (LASIX) 20 MG tablet Take 1 tablet (20 mg total) by mouth 2 (two) times daily. 03/16/22   Lindell Spar, MD  levothyroxine (SYNTHROID) 75 MCG tablet Take 1 tablet (75 mcg total) by mouth daily. 03/17/22   Lindell Spar, MD  Multiple Vitamin (MULTIVITAMIN WITH MINERALS) TABS tablet Take 1 tablet by mouth every morning.    [provider]  Omega-3 Fatty Acids (FISH OIL) 500 MG CAPS Take 500 mg by mouth every morning.    [provider]  omeprazole (PRILOSEC) 40 MG capsule Take 1 capsule (40 mg total) by mouth 2 (two) times daily. 03/17/22   Gatha Mayer, MD  oxycodone-acetaminophen (LYNOX) 2.5-300 MG per tablet Take 1 tablet by mouth at bedtime as needed for pain. 01/06/22   [provider]  Polyethyl Glycol-Propyl Glycol (SYSTANE) 0.4-0.3 % SOLN Place 1 drop into both eyes 2 (two)  times daily.    [provider]  potassium chloride SA (KLOR-CON M) 20 MEQ tablet TAKE 1 TABLET (20 MEQ) BY MOUTH DAILY PRN- EDEMA Patient taking differently: Take 20 mEq by mouth daily as needed (with each dose of Lasix). 09/16/21   Lindell Spar, MD  traMADol (ULTRAM) 50 MG tablet Take 50 mg by mouth every morning.    [provider]    Current Outpatient Medications  Medication Sig Dispense Refill   acetaminophen (TYLENOL) 650 MG CR tablet Take 1,300 mg by mouth at bedtime.     ALPRAZolam (XANAX) 0.25 MG tablet Take 0.25 mg by mouth  at bedtime as needed for anxiety or sleep.     amLODipine (NORVASC) 5 MG tablet TAKE (1) TABLET BY MOUTH ONCE DAILY. 90 tablet 0   Calcium Carbonate-Vitamin D (CALCIUM 600+D) 600-5 MG-MCG TABS Take 0.5-1 tablets by mouth See admin instructions. Take one tablet by mouth twice daily - with breakfast and supper; take 1/2 tablet with lunch     Cholecalciferol (VITAMIN D3) 50 MCG (2000 UT) TABS Take 2,000 Units by mouth every morning.     diclofenac Sodium (VOLTAREN) 1 % GEL APPLY 2 GRAMS TO AFFECTED AREA FOUR TIMES A DAY. (Patient taking differently: Apply 2 g topically at bedtime. Apply to hands, wrists, neck) 1050 g 3   ferrous sulfate 325 (65 FE) MG EC tablet Take 1 tablet (325 mg total) by mouth daily. 60 tablet 3   furosemide (LASIX) 20 MG tablet Take 1 tablet (20 mg total) by mouth 2 (two) times daily. 180 tablet 3   levothyroxine (SYNTHROID) 75 MCG tablet Take 1 tablet (75 mcg total) by mouth daily. 90 tablet 1   Multiple Vitamin (MULTIVITAMIN WITH MINERALS) TABS tablet Take 1 tablet by mouth every morning.     Omega-3 Fatty Acids (FISH OIL) 500 MG CAPS Take 500 mg by mouth every morning.     omeprazole (PRILOSEC) 40 MG capsule Take 1 capsule (40 mg total) by mouth 2 (two) times daily. 60 capsule 2   oxycodone-acetaminophen (LYNOX) 2.5-300 MG per tablet Take 1 tablet by mouth at bedtime as needed for pain.     Polyethyl Glycol-Propyl Glycol  (SYSTANE) 0.4-0.3 % SOLN Place 1 drop into both eyes 2 (two) times daily.     potassium chloride SA (KLOR-CON M) 20 MEQ tablet TAKE 1 TABLET (20 MEQ) BY MOUTH DAILY PRN- EDEMA (Patient taking differently: Take 20 mEq by mouth daily as needed (with each dose of Lasix).) 90 tablet 3   traMADol (ULTRAM) 50 MG tablet Take 50 mg by mouth every morning.     Adalimumab (HUMIRA PEN) 40 MG/0.4ML PNKT Inject 40 mg into the skin every Saturday.     denosumab (PROLIA) 60 MG/ML SOSY injection '60mg'$  Subcutaneous every 6 months 180 days (Patient taking differently: Inject 60 mg into the skin every 6 (six) months.) 1 mL 1   Current Facility-Administered Medications  Medication Dose Route Frequency Provider Last Rate Last Admin   0.9 %  sodium chloride infusion  500 mL Intravenous Once Gatha Mayer, MD        Allergies as of 04/15/2022 - Review Complete 04/15/2022  Allergen Reaction Noted   Aspirin Nausea And Vomiting and Other (See Comments) 05/04/2016   Macrobid [nitrofurantoin] Other (See Comments) 07/28/2019    Family History  Problem Relation Age of Onset   Diabetes Mother    Congestive Heart Failure Mother    Lung cancer Father    Thrombosis Sister    CAD Brother        CABG   Colon cancer Neg Hx    Esophageal cancer Neg Hx    Stomach cancer Neg Hx    Rectal cancer Neg Hx     Social History   Socioeconomic History   Marital status: Divorced    Spouse name: Not on file   Number of children: Not on file   Years of education: Not on file   Highest education level: Not on file  Occupational History   Occupation: disabled  Tobacco Use   Smoking status: Never   Smokeless tobacco: Never  Vaping Use  Vaping Use: Never used  Substance and Sexual Activity   Alcohol use: No   Drug use: No   Sexual activity: Not Currently    Birth control/protection: Surgical  Other Topics Concern   Not on file  Social History Narrative   Not on file   Social Determinants of Health   Financial  Resource Strain: Low Risk  (09/14/2021)   Overall Financial Resource Strain (CARDIA)    Difficulty of Paying Living Expenses: Not hard at all  Food Insecurity: No Food Insecurity (09/14/2021)   Hunger Vital Sign    Worried About Running Out of Food in the Last Year: Never true    Ran Out of Food in the Last Year: Never true  Transportation Needs: No Transportation Needs (09/14/2021)   PRAPARE - Hydrologist (Medical): No    Lack of Transportation (Non-Medical): No  Physical Activity: Sufficiently Active (09/14/2021)   Exercise Vital Sign    Days of Exercise per Week: 6 days    Minutes of Exercise per Session: 40 min  Stress: No Stress Concern Present (09/14/2021)   [place]    Feeling of Stress : Not at all  Social Connections: Moderately Isolated (09/14/2021)   Social Connection and Isolation Panel [NHANES]    Frequency of Communication with Friends and Family: More than three times a week    Frequency of Social Gatherings with Friends and Family: More than three times a week    Attends Religious Services: More than 4 times per year    Active Member of [employer] or Organizations: No    Attends Archivist Meetings: Never    Marital Status: Divorced  Human resources officer Violence: Not At Risk (09/14/2021)   Humiliation, Afraid, Rape, and Kick questionnaire    Fear of Current or Ex-Partner: No    Emotionally Abused: No    Physically Abused: No    Sexually Abused: No    Review of Systems:  All other review of systems negative except as mentioned in the HPI.  Physical Exam: Vital signs BP 134/84   Pulse 64   Temp (!) 96.8 F (36 C) (Temporal)   Ht '5\' 1"'$  (1.549 m)   Wt 162 lb (73.5 kg)   SpO2 99%   BMI 30.61 kg/m   General:   Alert,  Well-developed, well-nourished, pleasant and cooperative in NAD Lungs:  Clear throughout to auscultation.   Heart:  Regular rate and rhythm; no  murmurs, clicks, rubs,  or gallops. Abdomen:  Soft, nontender and nondistended. Normal bowel sounds.   Neuro/Psych:  Alert and cooperative. Normal mood and affect. A and O x 3   '@Arretta Toenjes'$  Simonne Maffucci, MD, [facility] [phone] (pager) 04/15/2022 10:23 AM@

## 2022-04-15 ENCOUNTER — Ambulatory Visit (AMBULATORY_SURGERY_CENTER): Payer: Medicare Other | Admitting: Internal Medicine

## 2022-04-15 ENCOUNTER — Encounter: Payer: Self-pay | Admitting: Internal Medicine

## 2022-04-15 VITALS — BP 128/72 | HR 78 | Temp 96.8°F | Resp 15 | Ht 61.0 in | Wt 162.0 lb

## 2022-04-15 DIAGNOSIS — K219 Gastro-esophageal reflux disease without esophagitis: Secondary | ICD-10-CM | POA: Diagnosis not present

## 2022-04-15 DIAGNOSIS — K284 Chronic or unspecified gastrojejunal ulcer with hemorrhage: Secondary | ICD-10-CM | POA: Diagnosis not present

## 2022-04-15 DIAGNOSIS — I10 Essential (primary) hypertension: Secondary | ICD-10-CM | POA: Diagnosis not present

## 2022-04-15 DIAGNOSIS — G4733 Obstructive sleep apnea (adult) (pediatric): Secondary | ICD-10-CM | POA: Diagnosis not present

## 2022-04-15 DIAGNOSIS — M069 Rheumatoid arthritis, unspecified: Secondary | ICD-10-CM | POA: Diagnosis not present

## 2022-04-15 DIAGNOSIS — D62 Acute posthemorrhagic anemia: Secondary | ICD-10-CM

## 2022-04-15 MED ORDER — SODIUM CHLORIDE 0.9 % IV SOLN: 500.0000 mL | Freq: Once | INTRAVENOUS | Status: DC

## 2022-04-15 MED ORDER — OMEPRAZOLE 40 MG PO CPDR: 40.0000 mg | DELAYED_RELEASE_CAPSULE | Freq: Every day | ORAL | 3 refills | Status: DC

## 2022-04-15 NOTE — Progress Notes (Signed)
Report to PACU, RN, vss, BBS= Clear.  

## 2022-04-15 NOTE — Progress Notes (Signed)
VS completed by CW.   Pt's states no medical or surgical changes since previsit or office visit.  

## 2022-04-15 NOTE — Op Note (Signed)
[facility] Patient Name: Kathy [name] Procedure Date: 04/15/2022 10:28 AM MRN: [id number] Endoscopist: Gatha Mayer , MD Age: 81 Referring MD:  Date of Birth: 02/25/1941 Gender: Female Account #: [id number] Procedure:                Upper GI endoscopy Indications:              Follow-up of acute gastrojejunal ulcer with                            hemorrhage Medicines:                Monitored Anesthesia Care Procedure:                Pre-Anesthesia Assessment:                           - Prior to the procedure, a History and Physical                            was performed, and patient medications and                            allergies were reviewed. The patient's tolerance of                            previous anesthesia was also reviewed. The risks                            and benefits of the procedure and the sedation                            options and risks were discussed with the patient.                            All questions were answered, and informed consent                            was obtained. Prior Anticoagulants: The patient has                            taken no previous anticoagulant or antiplatelet                            agents. ASA Grade Assessment: III - A patient with                            severe systemic disease. After reviewing the risks                            and benefits, the patient was deemed in                            satisfactory condition to undergo the procedure.  After obtaining informed consent, the endoscope was                            passed under direct vision. Throughout the                            procedure, the patient's blood pressure, pulse, and                            oxygen saturations were monitored continuously. The                            Endoscope was introduced through the mouth, and                            advanced to the second part of duodenum. The  upper                            GI endoscopy was accomplished without difficulty.                            The patient tolerated the procedure well. Scope In: Scope Out: Findings:                 Evidence of a gastric bypass was found. A gastric                            pouch was found. The gastrojejunal anastomosis was                            characterized by healthy appearing mucosa. This was                            traversed.                           The examined esophagus was normal.                           The examined jejunum was normal.                           The cardia and gastric fundus were normal on                            retroflexion. Complications:            No immediate complications. Estimated Blood Loss:     Estimated blood loss: none. Impression:               - Gastric bypass. Gastrojejunal anastomosis                            characterized by healthy appearing mucosa.                           - Normal  esophagus.                           - Normal examined jejunum.                           - No specimens collected. Recommendation:           - Patient has a contact number available for                            emergencies. The signs and symptoms of potential                            delayed complications were discussed with the                            patient. Return to normal activities tomorrow.                            Written discharge instructions were provided to the                            patient.                           - Resume previous diet.                           - Continue present medications.                           - CHANGE OMEPRAZOLE TO 40 MG QD LIFELONG                           STAY ON FERROUS SULFATE - F/U PCP RE: ANEMIA                           SEE GI AS NEEDED Gatha Mayer, MD 04/15/2022 10:49:13 AM This report has been signed electronically.

## 2022-04-15 NOTE — Patient Instructions (Addendum)
The ulcer is healed!  Reduce omeprazole to once a day and stay on that (do not stop unless a doctor tells you to).  Be sure to continue iron pill (ferrous sulfate) and Dr. Posey Pronto can check blood counts at next appointment.  I appreciate the opportunity to care for you. Gatha Mayer, MD, FACG  YOU HAD AN ENDOSCOPIC PROCEDURE TODAY AT [facility]:   Refer to the procedure report that was given to you for any specific questions about what was found during the examination.  If the procedure report does not answer your questions, please call your gastroenterologist to clarify.  If you requested that your care partner not be given the details of your procedure findings, then the procedure report has been included in a sealed envelope for you to review at your convenience later.  YOU SHOULD EXPECT: Some feelings of bloating in the abdomen. Passage of more gas than usual.  Walking can help get rid of the air that was put into your GI tract during the procedure and reduce the bloating. If you had a lower endoscopy (such as a colonoscopy or flexible sigmoidoscopy) you may notice spotting of blood in your stool or on the toilet paper. If you underwent a bowel prep for your procedure, you may not have a normal bowel movement for a few days.  Please Note:  You might notice some irritation and congestion in your nose or some drainage.  This is from the oxygen used during your procedure.  There is no need for concern and it should clear up in a day or so.  SYMPTOMS TO REPORT IMMEDIATELY:  Following upper endoscopy (EGD)  Vomiting of blood or coffee ground material  New chest pain or pain under the shoulder blades  Painful or persistently difficult swallowing  New shortness of breath  Fever of 100F or higher  Black, tarry-looking stools  For urgent or emergent issues, a gastroenterologist can be reached at any hour by calling [phone]. Do not use MyChart messaging for urgent  concerns.    DIET:  We do recommend a small meal at first, but then you may proceed to your regular diet.  Drink plenty of fluids but you should avoid alcoholic beverages for 24 hours.  ACTIVITY:  You should plan to take it easy for the rest of today and you should NOT DRIVE or use heavy machinery until tomorrow (because of the sedation medicines used during the test).    FOLLOW UP: Our staff will call the number listed on your records the next business day following your procedure.  We will call around 7:15- 8:00 am to check on you and address any questions or concerns that you may have regarding the information given to you following your procedure. If we do not reach you, we will leave a message.     If any biopsies were taken you will be contacted by phone or by letter within the next 1-3 weeks.  Please call us at [phone] if you have not heard about the biopsies in 3 weeks.    SIGNATURES/CONFIDENTIALITY: You and/or your care partner have signed paperwork which will be entered into your electronic medical record.  These signatures attest to the fact that that the information above on your After Visit Summary has been reviewed and is understood.  Full responsibility of the confidentiality of this discharge information lies with you and/or your care-partner.

## 2022-04-16 ENCOUNTER — Telehealth: Payer: Self-pay | Admitting: *Deleted

## 2022-04-16 NOTE — Telephone Encounter (Signed)
  Follow up Call-     04/15/2022    9:19 AM  Call back number  Post procedure Call Back phone  # [phone]  Permission to leave phone message Yes     Patient questions:  Do you have a fever, pain , or abdominal swelling? No. Pain Score  0 *  Have you tolerated food without any problems? Yes.    Have you been able to return to your normal activities? Yes.    Do you have any questions about your discharge instructions: Diet   No. Medications  No. Follow up visit  No.  Do you have questions or concerns about your Care? No.  Actions: * If pain score is 4 or above: No action needed, pain <4.

## 2022-04-20 ENCOUNTER — Other Ambulatory Visit (HOSPITAL_COMMUNITY): Payer: Self-pay

## 2022-04-27 ENCOUNTER — Other Ambulatory Visit (HOSPITAL_COMMUNITY): Payer: Self-pay

## 2022-04-27 MED ORDER — PROLIA 60 MG/ML ~~LOC~~ SOSY: 60.0000 mg | PREFILLED_SYRINGE | SUBCUTANEOUS | 1 refills | Status: DC

## 2022-04-28 ENCOUNTER — Other Ambulatory Visit (HOSPITAL_COMMUNITY): Payer: Self-pay

## 2022-04-29 ENCOUNTER — Ambulatory Visit (INDEPENDENT_AMBULATORY_CARE_PROVIDER_SITE_OTHER): Payer: Medicare Other | Admitting: Podiatry

## 2022-04-29 DIAGNOSIS — L600 Ingrowing nail: Secondary | ICD-10-CM

## 2022-04-29 MED ORDER — DOXYCYCLINE HYCLATE 100 MG PO TABS: 100.0000 mg | ORAL_TABLET | Freq: Two times a day (BID) | ORAL | Status: DC

## 2022-04-29 NOTE — Progress Notes (Signed)
Subjective:  Patient ID: Kathy [name], female    DOB: July 11, 1941,  MRN: [id number]  Chief Complaint  Patient presents with   Ingrown Toenail    81 y.o. female presents with the above complaint.  Patient Kathy [name] with complaint of right hallux nail paronychia.  She states there is some infection and some pain associated with it.  She states the ingrown is doing well.  She had an ingrown removed by me 2 weeks ago.  Denies any other acute complaints has been keeping covered with Neosporin and a Band-Aid.   Review of Systems: Negative except as noted in the HPI. Denies N/V/F/Ch.  Past Medical History:  Diagnosis Date   Abnormal findings on diagnostic imaging of heart and coronary circulation    Anemia    FROM BLEEDING ULCER   Anxiety    takes Alprazolam daily as needed   Arthritis    dx with RA 2017   Bariatric surgery status    Cataract    Diverticulosis    Gastro-esophageal reflux disease with esophagitis    Gastrojejunal ulcer with hemorrhage    Headache(784.0)    occasionally   History of blood transfusion    no abnormal reaction noted   History of bronchitis    last time many yrs ago   Hyperlipidemia    PT DENIES THIS DX -  ON NO MEDS AND NO ONE HAS TOLD HER   Hypertension    takes Amlodipine daily   Hypothyroidism    takes Synthroid daily   Insomnia    Joint pain    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   Localized edema    Nocturia    Numbness    occasionally left arm at night   Obesity    Osteoporosis    takes Fosamax weekly   Pain in joint involving pelvic region and thigh    Peripheral edema    takes Lasix daily as needed   PONV (postoperative nausea and vomiting)    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/2017   Rheumatoid arthritis, unspecified ([place])    Rotator cuff arthropathy, 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   Sleep apnea    Phreesia 08/22/2020   Stomach ulcer    Thyroid disease    Phreesia 08/22/2020   Unspecified injury of muscle(s)  and tendon(s) of the rotator cuff of left shoulder, subsequent encounter    Unspecified osteoarthritis, unspecified site    Wears glasses    Wears partial dentures     Current Outpatient Medications:    doxycycline (VIBRA-TABS) 100 MG tablet, Take 1 tablet (100 mg total) by mouth 2 (two) times daily., Disp: 20 tablet, Rfl: `0   acetaminophen (TYLENOL) 650 MG CR tablet, Take 1,300 mg by mouth at bedtime., Disp: , Rfl:    Adalimumab (HUMIRA PEN) 40 MG/0.4ML PNKT, Inject 40 mg into the skin every Saturday., Disp: , Rfl:    ALPRAZolam (XANAX) 0.25 MG tablet, Take 0.25 mg by mouth at bedtime as needed for anxiety or sleep., Disp: , Rfl:    amLODipine (NORVASC) 5 MG tablet, TAKE (1) TABLET BY MOUTH ONCE DAILY., Disp: 90 tablet, Rfl: 0   Calcium Carbonate-Vitamin D (CALCIUM 600+D) 600-5 MG-MCG TABS, Take 0.5-1 tablets by mouth See admin instructions. Take one tablet by mouth twice daily - with breakfast and supper; take 1/2 tablet with lunch, Disp: , Rfl:    Cholecalciferol (VITAMIN D3) 50 MCG (2000 UT) TABS, Take 2,000 Units by mouth every morning., Disp: , Rfl:    denosumab (  PROLIA) 60 MG/ML SOSY injection, '60mg'$  Subcutaneous every 6 months 180 days (Patient taking differently: Inject 60 mg into the skin every 6 (six) months.), Disp: 1 mL, Rfl: 1   denosumab (PROLIA) 60 MG/ML SOSY injection, '60mg'$  Subcutaneous every 6 months 180 days, Disp: 1 mL, Rfl: 1   diclofenac Sodium (VOLTAREN) 1 % GEL, APPLY 2 GRAMS TO AFFECTED AREA FOUR TIMES A DAY. (Patient taking differently: Apply 2 g topically at bedtime. Apply to hands, wrists, neck), Disp: 1050 g, Rfl: 3   ferrous sulfate 325 (65 FE) MG EC tablet, Take 1 tablet (325 mg total) by mouth daily., Disp: 60 tablet, Rfl: 3   furosemide (LASIX) 20 MG tablet, Take 1 tablet (20 mg total) by mouth 2 (two) times daily., Disp: 180 tablet, Rfl: 3   levothyroxine (SYNTHROID) 75 MCG tablet, Take 1 tablet (75 mcg total) by mouth daily., Disp: 90 tablet, Rfl: 1   Multiple  Vitamin (MULTIVITAMIN WITH MINERALS) TABS tablet, Take 1 tablet by mouth every morning., Disp: , Rfl:    Omega-3 Fatty Acids (FISH OIL) 500 MG CAPS, Take 500 mg by mouth every morning., Disp: , Rfl:    omeprazole (PRILOSEC) 40 MG capsule, Take 1 capsule (40 mg total) by mouth daily before breakfast. Open capsule and swallow granules with liquid or applesauce, Disp: 90 capsule, Rfl: 3   oxycodone-acetaminophen (LYNOX) 2.5-300 MG per tablet, Take 1 tablet by mouth at bedtime as needed for pain., Disp: , Rfl:    Polyethyl Glycol-Propyl Glycol (SYSTANE) 0.4-0.3 % SOLN, Place 1 drop into both eyes 2 (two) times daily., Disp: , Rfl:    potassium chloride SA (KLOR-CON M) 20 MEQ tablet, TAKE 1 TABLET (20 MEQ) BY MOUTH DAILY PRN- EDEMA (Patient taking differently: Take 20 mEq by mouth daily as needed (with each dose of Lasix).), Disp: 90 tablet, Rfl: 3   traMADol (ULTRAM) 50 MG tablet, Take 50 mg by mouth every morning., Disp: , Rfl:   Social History   Tobacco Use  Smoking Status Never  Smokeless Tobacco Never    Allergies  Allergen Reactions   Aspirin Nausea And Vomiting and Other (See Comments)    Adult Aspirin: upset stomach / irritates ulcer    Macrobid [Nitrofurantoin] Other (See Comments)    Stomach pain   Objective:  There were no vitals filed for this visit. There is no height or weight on file to calculate BMI. Constitutional Well developed. Well nourished.  Vascular Dorsalis pedis pulses palpable bilaterally. Posterior tibial pulses palpable bilaterally. Capillary refill normal to all digits.  No cyanosis or clubbing noted. Pedal hair growth normal.  Neurologic Normal speech. Oriented to person, place, and time. Epicritic sensation to light touch grossly present bilaterally.  Dermatologic No further ingrowing noted at the right medial border.  Mild paronychia noted at the medial corner.  No purulent drainage noted No other open wounds. No skin lesions.  Orthopedic: Normal joint  ROM without pain or crepitus bilaterally. No visible deformities. No bony tenderness.   Radiographs: None Assessment:   1. Ingrown toenail of right foot     Plan:  Patient was evaluated and treated and all questions answered.  Ingrown Nail, right with history of removal --Clinically I am not able to appreciate any current recurrence also it is too early to tell for recurrence.  There is some paronychia present she will benefit from doxycycline.  I encouraged her to stop doing the soaks and Neosporin.  At this time she can keep it covered either with  a Band-Aid or leave it open to air.  She states understanding I discussed shoe gear modification as well -Doxycycline was sent to the pharmacy if there is no improvement come back and see me.  She states understanding No follow-ups on file.

## 2022-04-30 ENCOUNTER — Other Ambulatory Visit (HOSPITAL_COMMUNITY): Payer: Self-pay

## 2022-04-30 ENCOUNTER — Telehealth: Payer: Self-pay | Admitting: *Deleted

## 2022-04-30 MED ORDER — DOXYCYCLINE HYCLATE 100 MG PO TABS: 100.0000 mg | ORAL_TABLET | Freq: Two times a day (BID) | ORAL | Status: DC

## 2022-04-30 NOTE — Telephone Encounter (Signed)
Patient is calling for status of medication that was supposed to be sent to pharmacy on file,not there per pharmacy.  Resent doxycycline to [facility]. Patient has been notified that medication has been resent.

## 2022-05-03 ENCOUNTER — Telehealth: Payer: Self-pay | Admitting: Podiatry

## 2022-05-03 DIAGNOSIS — M810 Age-related osteoporosis without current pathological fracture: Secondary | ICD-10-CM | POA: Diagnosis not present

## 2022-05-03 MED ORDER — DOXYCYCLINE HYCLATE 100 MG PO TABS: 100.0000 mg | ORAL_TABLET | Freq: Two times a day (BID) | ORAL | 0 refills | Status: DC

## 2022-05-03 NOTE — Telephone Encounter (Signed)
Patient called after hours line stating she is suppose to have antibiotics sent to [facility] and they have not received RX.

## 2022-05-03 NOTE — Telephone Encounter (Signed)
Called pt LM on VM that RX was sent to pharmacy.

## 2022-06-07 ENCOUNTER — Encounter: Payer: Self-pay | Admitting: Cardiology

## 2022-06-07 ENCOUNTER — Ambulatory Visit: Payer: Medicare Other | Attending: Cardiology | Admitting: Cardiology

## 2022-06-07 VITALS — BP 116/70 | HR 66 | Ht 61.5 in | Wt 159.4 lb

## 2022-06-07 DIAGNOSIS — R600 Localized edema: Secondary | ICD-10-CM

## 2022-06-07 DIAGNOSIS — R9431 Abnormal electrocardiogram [ECG] [EKG]: Secondary | ICD-10-CM | POA: Diagnosis not present

## 2022-06-07 DIAGNOSIS — I10 Essential (primary) hypertension: Secondary | ICD-10-CM | POA: Diagnosis not present

## 2022-06-07 NOTE — Patient Instructions (Signed)
Medication Instructions:  Continue all current medications.   Labwork: none  Testing/Procedures: none  Follow-Up: 6 months   Any Other Special Instructions Will Be Listed Below (If Applicable).   If you need a refill on your cardiac medications before your next appointment, please call your pharmacy.  

## 2022-06-07 NOTE — Progress Notes (Signed)
Clinical Summary Ms. San is a 81 y.o.female seen today for follow up of the following medical problems.          1. Leg edema - echo 11/2016 LVEF 55-60%, no WMAs, grade I diastolic dysfunction, PASP 20, mild RV dilatation     - taking lasix '20mg'$  daily, overall controls her swelling   03/2022 echo: LVEF 60-65%, no WMAs, indet diastolic fxn, normal RV function, normal IVC - swelling up and down on lasix.    2. Palpitations - infrequent symptoms - 1-2 times per week, feeling of heart racing. Often comes when starting activity.  - TSH was low 03/2022, pcp lowered thyroid medicine.    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 On my read I do not agree with left axis or bifacsicular block on computer read. RBBB there at least since 2015     4. HTN - she is compliant with meds  5. History of GI bleed - admit 12/2021 - on PPI without recurrence.    Past Medical History:  Diagnosis Date   Abnormal findings on diagnostic imaging of heart and coronary circulation    Anemia    FROM BLEEDING ULCER   Anxiety    takes Alprazolam daily as needed   Arthritis    dx with RA 2017   Bariatric surgery status    Cataract    Diverticulosis    Gastro-esophageal reflux disease with esophagitis    Gastrojejunal ulcer with hemorrhage    Headache(784.0)    occasionally   History of blood transfusion    no abnormal reaction noted   History of bronchitis    last time many yrs ago   Hyperlipidemia    PT DENIES THIS DX -  ON NO MEDS AND NO ONE HAS TOLD HER   Hypertension    takes Amlodipine daily   Hypothyroidism    takes Synthroid daily   Insomnia    Joint pain    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   Localized edema    Nocturia    Numbness    occasionally left arm at night   Obesity    Osteoporosis    takes Fosamax weekly   Pain in joint involving pelvic region and thigh    Peripheral edema    takes Lasix daily as needed    PONV (postoperative nausea and vomiting)    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/2017   Rheumatoid arthritis, unspecified ([place])    Rotator cuff arthropathy, 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   Sleep apnea    Phreesia 08/22/2020   Stomach ulcer    Thyroid disease    Phreesia 08/22/2020   Unspecified injury of muscle(s) and tendon(s) of the rotator cuff of left shoulder, subsequent encounter    Unspecified osteoarthritis, unspecified site    Wears glasses    Wears partial dentures      Allergies  Allergen Reactions   Aspirin Nausea And Vomiting and Other (See Comments)    Adult Aspirin: upset stomach / irritates ulcer    Macrobid [Nitrofurantoin] Other (See Comments)    Stomach pain     Current Outpatient Medications  Medication Sig Dispense Refill   acetaminophen (TYLENOL) 650 MG CR tablet Take 1,300 mg by mouth at bedtime.     Adalimumab (HUMIRA PEN) 40 MG/0.4ML PNKT Inject 40 mg into the skin every Saturday.     ALPRAZolam (XANAX) 0.25 MG tablet Take 0.25 mg by mouth  at bedtime as needed for anxiety or sleep.     amLODipine (NORVASC) 5 MG tablet TAKE (1) TABLET BY MOUTH ONCE DAILY. 90 tablet 0   Calcium Carbonate-Vitamin D (CALCIUM 600+D) 600-5 MG-MCG TABS Take 0.5-1 tablets by mouth See admin instructions. Take one tablet by mouth twice daily - with breakfast and supper; take 1/2 tablet with lunch     Cholecalciferol (VITAMIN D3) 50 MCG (2000 UT) TABS Take 2,000 Units by mouth every morning.     denosumab (PROLIA) 60 MG/ML SOSY injection '60mg'$  Subcutaneous every 6 months 180 days (Patient taking differently: Inject 60 mg into the skin every 6 (six) months.) 1 mL 1   denosumab (PROLIA) 60 MG/ML SOSY injection '60mg'$  Subcutaneous every 6 months 180 days 1 mL 1   diclofenac Sodium (VOLTAREN) 1 % GEL APPLY 2 GRAMS TO AFFECTED AREA FOUR TIMES A DAY. (Patient taking differently: Apply 2 g topically at bedtime. Apply to hands, wrists, neck) 1050 g 3   doxycycline (VIBRA-TABS) 100 MG  tablet Take 1 tablet (100 mg total) by mouth 2 (two) times daily. 20 tablet `0   doxycycline (VIBRA-TABS) 100 MG tablet Take 1 tablet (100 mg total) by mouth 2 (two) times daily. 20 tablet 0   ferrous sulfate 325 (65 FE) MG EC tablet Take 1 tablet (325 mg total) by mouth daily. 60 tablet 3   furosemide (LASIX) 20 MG tablet Take 1 tablet (20 mg total) by mouth 2 (two) times daily. 180 tablet 3   levothyroxine (SYNTHROID) 75 MCG tablet Take 1 tablet (75 mcg total) by mouth daily. 90 tablet 1   Multiple Vitamin (MULTIVITAMIN WITH MINERALS) TABS tablet Take 1 tablet by mouth every morning.     Omega-3 Fatty Acids (FISH OIL) 500 MG CAPS Take 500 mg by mouth every morning.     omeprazole (PRILOSEC) 40 MG capsule Take 1 capsule (40 mg total) by mouth daily before breakfast. Open capsule and swallow granules with liquid or applesauce 90 capsule 3   oxycodone-acetaminophen (LYNOX) 2.5-300 MG per tablet Take 1 tablet by mouth at bedtime as needed for pain.     Polyethyl Glycol-Propyl Glycol (SYSTANE) 0.4-0.3 % SOLN Place 1 drop into both eyes 2 (two) times daily.     potassium chloride SA (KLOR-CON M) 20 MEQ tablet TAKE 1 TABLET (20 MEQ) BY MOUTH DAILY PRN- EDEMA (Patient taking differently: Take 20 mEq by mouth daily as needed (with each dose of Lasix).) 90 tablet 3   traMADol (ULTRAM) 50 MG tablet Take 50 mg by mouth every morning.     No current facility-administered medications for this visit.     Past Surgical History:  Procedure Laterality Date   ABDOMINAL HYSTERECTOMY     partial   cataract surgery Bilateral    CHOLECYSTECTOMY     COLONOSCOPY     ESOPHAGOGASTRODUODENOSCOPY (EGD) WITH PROPOFOL N/A 01/11/2022   Procedure: ESOPHAGOGASTRODUODENOSCOPY (EGD) WITH PROPOFOL;  Surgeon: Gatha Mayer, MD;  Location: [place];  Service: Gastroenterology;  Laterality: N/A;   EYE SURGERY     CATARACTS BOTH   gastric bypass surgery     HOT HEMOSTASIS N/A 01/11/2022   Procedure: HOT HEMOSTASIS  (ARGON PLASMA COAGULATION/BICAP);  Surgeon: Gatha Mayer, MD;  Location: [place];  Service: Gastroenterology;  Laterality: N/A;   JOINT REPLACEMENT Bilateral    hip   REVERSE SHOULDER ARTHROPLASTY Right 05/11/2016   Procedure: REVERSE SHOULDER ARTHROPLASTY;  Surgeon: Marchia Bond, MD;  Location: [place];  Service: Orthopedics;  Laterality: Right;  REVISION TOTAL HIP ARTHROPLASTY Left 10/03/2013   DR Mayer Camel   ROTATOR CUFF REPAIR     SCLEROTHERAPY  01/11/2022   Procedure: Clide Deutscher;  Surgeon: Gatha Mayer, MD;  Location: [facility];  Service: Gastroenterology;;   TOTAL HIP REVISION Left 10/03/2013   Procedure: TOTAL HIP REVISION- left;  Surgeon: Kerin Salen, MD;  Location: [place];  Service: Orthopedics;  Laterality: Left;   TOTAL KNEE ARTHROPLASTY Left 11/29/2017   Procedure: LEFT TOTAL KNEE ARTHROPLASTY;  Surgeon: Marchia Bond, MD;  Location: [place];  Service: Orthopedics;  Laterality: Left;   TOTAL SHOULDER ARTHROPLASTY Left 11/09/2016   Procedure: TOTAL REVERSE SHOULDER ARTHROPLASTY;  Surgeon: Marchia Bond, MD;  Location: [place];  Service: Orthopedics;  Laterality: Left;   TOTAL SHOULDER REPLACEMENT Left 10/2016   TOTAL THYROIDECTOMY     UPPER GASTROINTESTINAL ENDOSCOPY       Allergies  Allergen Reactions   Aspirin Nausea And Vomiting and Other (See Comments)    Adult Aspirin: upset stomach / irritates ulcer    Macrobid [Nitrofurantoin] Other (See Comments)    Stomach pain      Family History  Problem Relation Age of Onset   Diabetes Mother    Congestive Heart Failure Mother    Lung cancer Father    Thrombosis Sister    CAD Brother        CABG   Colon cancer Neg Hx    Esophageal cancer Neg Hx    Stomach cancer Neg Hx    Rectal cancer Neg Hx      Social History Ms. Gilberto reports that she has never smoked. She has never used smokeless tobacco. Ms. Velaquez reports no history of alcohol use.   Review of Systems CONSTITUTIONAL: No weight loss, fever,  chills, weakness or fatigue.  HEENT: Eyes: No visual loss, blurred vision, double vision or yellow sclerae.No hearing loss, sneezing, congestion, runny nose or sore throat.  SKIN: No rash or itching.  CARDIOVASCULAR: per hpi RESPIRATORY: No shortness of breath, cough or sputum.  GASTROINTESTINAL: No anorexia, nausea, vomiting or diarrhea. No abdominal pain or blood.  GENITOURINARY: No burning on urination, no polyuria NEUROLOGICAL: No headache, dizziness, syncope, paralysis, ataxia, numbness or tingling in the extremities. No change in bowel or bladder control.  MUSCULOSKELETAL: No muscle, back pain, joint pain or stiffness.  LYMPHATICS: No enlarged nodes. No history of splenectomy.  PSYCHIATRIC: No history of depression or anxiety.  ENDOCRINOLOGIC: No reports of sweating, cold or heat intolerance. No polyuria or polydipsia.  Marland Kitchen   Physical Examination Today's Vitals   06/07/22 1316  BP: 116/70  Pulse: 66  SpO2: 98%  Weight: 159 lb 6.4 oz (72.3 kg)  Height: 5' 1.5" (1.562 m)   Body mass index is 29.63 kg/m.  Gen: resting comfortably, no acute distress HEENT: no scleral icterus, pupils equal round and reactive, no palptable cervical adenopathy,  CV: RRR, no m/r/g no jvd Resp: Clear to auscultation bilaterally GI: abdomen is soft, non-tender, non-distended, normal bowel sounds, no hepatosplenomegaly MSK: extremities are warm, no edema.  Skin: warm, no rash Neuro:  no focal deficits Psych: appropriate affect   Diagnostic Studies  11/2016 echo Study Conclusions   - Left ventricle: The cavity size was normal. Wall thickness was   normal. Systolic function was normal. The estimated ejection   fraction was in the range of 55% to 60%. Wall motion was normal;   there were no regional wall motion abnormalities. Doppler   parameters are consistent with abnormal left ventricular  relaxation (grade 1 diastolic dysfunction). - Aortic valve: Mildly calcified annulus. Trileaflet. -  Mitral valve: There was trivial regurgitation. - Right ventricle: The cavity size was mildly dilated. - Right atrium: The atrium was at the upper limits of normal in   size. - Atrial septum: No defect or patent foramen ovale was identified. - Tricuspid valve: There was trivial regurgitation. - Pulmonary arteries: PA peak pressure: 20 mm Hg (S). - Pericardium, extracardiac: A small pericardial effusion was   identified circumferential to the heart.   Impressions:   - Normal LV wall thickness with LVEF 55-60% and grade 1 diastolic   dysfunction. Trivial mitral regurgitation. Mildly calcified   aortic annulus. Mild dilated right ventricle and upper normal   right atrial chamber size. Trivial tricuspid regurgitation with   PASP estimated 20 mmHg. Small circumferential pericardial   effusion.     03/2022 echo 1. Left ventricular ejection fraction, by estimation, is 60 to 65%. The  left ventricle has normal function. The left ventricle has no regional  wall motion abnormalities. Left ventricular diastolic parameters are  indeterminate.   2. Right ventricular systolic function is normal. The right ventricular  size is normal. There is mildly elevated pulmonary artery systolic  pressure.   3. Left atrial size was mild to moderately dilated.   4. Right atrial size was mildly dilated.   5. The mitral valve is normal in structure. Mild mitral valve  regurgitation. No evidence of mitral stenosis.   6. The tricuspid valve is abnormal. Tricuspid valve regurgitation is mild  to moderate.   7. The aortic valve is tricuspid. Aortic valve regurgitation is not  visualized. No aortic stenosis is present.   8. The inferior vena cava is normal in size with greater than 50%  respiratory variability, suggesting right atrial pressure of 3 mmHg.   Assessment and Plan  1. LE edema - echo with mild LV diastolic dysfunction, may be playing some role. Likely some component of venous insufficiency as  well -repeat echo without any new changes, swelling overall controlled with lasix '20mg'$  bid. Continue current meds. If swelling progresses could consider trial of diff bp med other than norvasc.    2. 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 Also thyroid has been high, meds being adjusted by pcp. If significant progression could consider monitor.   3. HTN - she is at goal, continue current meds      Arnoldo Lenis, M.D.

## 2022-06-15 ENCOUNTER — Other Ambulatory Visit: Payer: Self-pay | Admitting: Internal Medicine

## 2022-06-15 DIAGNOSIS — E039 Hypothyroidism, unspecified: Secondary | ICD-10-CM

## 2022-06-15 DIAGNOSIS — R7989 Other specified abnormal findings of blood chemistry: Secondary | ICD-10-CM

## 2022-07-05 DIAGNOSIS — M25569 Pain in unspecified knee: Secondary | ICD-10-CM | POA: Diagnosis not present

## 2022-07-05 DIAGNOSIS — M79643 Pain in unspecified hand: Secondary | ICD-10-CM | POA: Diagnosis not present

## 2022-07-05 DIAGNOSIS — Z79891 Long term (current) use of opiate analgesic: Secondary | ICD-10-CM | POA: Diagnosis not present

## 2022-07-05 DIAGNOSIS — G5600 Carpal tunnel syndrome, unspecified upper limb: Secondary | ICD-10-CM | POA: Diagnosis not present

## 2022-07-19 ENCOUNTER — Ambulatory Visit (INDEPENDENT_AMBULATORY_CARE_PROVIDER_SITE_OTHER): Payer: Medicare Other | Admitting: Internal Medicine

## 2022-07-19 ENCOUNTER — Encounter: Payer: Self-pay | Admitting: Internal Medicine

## 2022-07-19 VITALS — BP 113/71 | HR 70 | Ht 61.5 in | Wt 164.4 lb

## 2022-07-19 DIAGNOSIS — M130 Polyarthritis, unspecified: Secondary | ICD-10-CM | POA: Diagnosis not present

## 2022-07-19 DIAGNOSIS — E038 Other specified hypothyroidism: Secondary | ICD-10-CM | POA: Diagnosis not present

## 2022-07-19 DIAGNOSIS — M810 Age-related osteoporosis without current pathological fracture: Secondary | ICD-10-CM | POA: Diagnosis not present

## 2022-07-19 DIAGNOSIS — D500 Iron deficiency anemia secondary to blood loss (chronic): Secondary | ICD-10-CM

## 2022-07-19 DIAGNOSIS — M069 Rheumatoid arthritis, unspecified: Secondary | ICD-10-CM

## 2022-07-19 DIAGNOSIS — I10 Essential (primary) hypertension: Secondary | ICD-10-CM

## 2022-07-19 DIAGNOSIS — K219 Gastro-esophageal reflux disease without esophagitis: Secondary | ICD-10-CM

## 2022-07-19 MED ORDER — AMLODIPINE BESYLATE 5 MG PO TABS: 5.0000 mg | ORAL_TABLET | Freq: Every day | ORAL | 3 refills | Status: DC

## 2022-07-19 NOTE — Patient Instructions (Signed)
Please continue to take medications as prescribed. ? ?Please continue to follow low salt diet and ambulate as tolerated. ?

## 2022-07-19 NOTE — Assessment & Plan Note (Signed)
On omeprazole again Recent upper GI bleeding due to gastrojejunal ulcer, repeat EGD reviewed Followed by GI

## 2022-07-19 NOTE — Assessment & Plan Note (Signed)
Lab Results  Component Value Date   TSH 0.249 (L) 03/16/2022   TSH borderline low, no symptoms of overcorrection currently On Levothyroxine 88 mcg QD Check TSH and free T4 Followed by Dr. Chalmers Cater

## 2022-07-19 NOTE — Assessment & Plan Note (Signed)
BP Readings from Last 1 Encounters:  07/19/22 113/71   Well-controlled with Amlodipine Counseled for compliance with the medications Advised DASH diet and moderate exercise/walking, at least 150 mins/week

## 2022-07-19 NOTE — Assessment & Plan Note (Signed)
Had acute blood loss anemia due to bleeding gastrojejunal ulcer, now resolved Used to take iron supplement Check CBC

## 2022-07-19 NOTE — Assessment & Plan Note (Signed)
Has chronic pain, has tramadol as needed for severe pain Going to see [place] [facility]

## 2022-07-19 NOTE — Progress Notes (Signed)
Established Patient Office Visit  Subjective:  Patient ID: Kathy [name], female    DOB: Sep 19, 1940  Age: 81 y.o. MRN: [id number]  CC:  Chief Complaint  Patient presents with   Follow-up    For hypertension     HPI Kathy [name] is a 81 y.o. female with past medical history of HTN, RA, hypothyroidism, osteoporosis, HLD, anxiety and polyarthritis who presents for f/u of her chronic medical conditions.  HTN: BP is well-controlled. Takes medications regularly. Patient denies headache, dizziness, chest pain, dyspnea or palpitations.  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  She does not have LAFB according to cardiology eval. She has had leg swelling, for which she was told to take Lasix 20 mg by cardiology provider.  Her leg swelling has improved with Lasix 20 mg QD PRN.  Hypothyroidism: Followed by Dr. Chalmers Cater. She continues to take levothyroxine 88 mcg daily.  Denies any recent change in weight or appetite.  She has a h/o RA, for which she has been getting Humira. She follows up with Rheumatologist. She c/o chronic pain and morning stiffness over b/l hands. She also c/o left knee pain, where she has had TKA for OA. She also has had shoulder and b/l hip replacement.  She takes tramadol as needed for severe pain.  She used to see Barton Fanny at [facility] neurology.  She is going to see [facility] clinic for pain management as [facility] neurology is closing.  She also had DEXA scan recently, which was better.  She is on Prolia for it currently.  She takes Xanax for anxiety, but does not have to take it daily.    Past Medical History:  Diagnosis Date   Abnormal findings on diagnostic imaging of heart and coronary circulation    Anemia    FROM BLEEDING ULCER   Anxiety    takes Alprazolam daily as needed   Arthritis    dx with RA 2017   Bariatric surgery status    Cataract    Diverticulosis    Gastro-esophageal reflux disease with esophagitis     Gastrojejunal ulcer with hemorrhage    Headache(784.0)    occasionally   History of blood transfusion    no abnormal reaction noted   History of bronchitis    last time many yrs ago   Hyperlipidemia    PT DENIES THIS DX -  ON NO MEDS AND NO ONE HAS TOLD HER   Hypertension    takes Amlodipine daily   Hypothyroidism    takes Synthroid daily   Insomnia    Joint pain    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   Localized edema    Nocturia    Numbness    occasionally left arm at night   Obesity    Osteoporosis    takes Fosamax weekly   Pain in joint involving pelvic region and thigh    Peripheral edema    takes Lasix daily as needed   PONV (postoperative nausea and vomiting)    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/2017   Rheumatoid arthritis, unspecified ([place])    Rotator cuff arthropathy, 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   Sleep apnea    Phreesia 08/22/2020   Stomach ulcer    Thyroid disease    Phreesia 08/22/2020   Unspecified injury of muscle(s) and tendon(s) of the rotator cuff of left shoulder, subsequent encounter    Unspecified osteoarthritis, unspecified site    Wears glasses  Wears partial dentures     Past Surgical History:  Procedure Laterality Date   ABDOMINAL HYSTERECTOMY     partial   cataract surgery Bilateral    CHOLECYSTECTOMY     COLONOSCOPY     ESOPHAGOGASTRODUODENOSCOPY (EGD) WITH PROPOFOL N/A 01/11/2022   Procedure: ESOPHAGOGASTRODUODENOSCOPY (EGD) WITH PROPOFOL;  Surgeon: Gatha Mayer, MD;  Location: [place];  Service: Gastroenterology;  Laterality: N/A;   EYE SURGERY     CATARACTS BOTH   gastric bypass surgery     HOT HEMOSTASIS N/A 01/11/2022   Procedure: HOT HEMOSTASIS (ARGON PLASMA COAGULATION/BICAP);  Surgeon: Gatha Mayer, MD;  Location: [place];  Service: Gastroenterology;  Laterality: N/A;   JOINT REPLACEMENT Bilateral    hip   REVERSE SHOULDER ARTHROPLASTY Right 05/11/2016   Procedure: REVERSE SHOULDER  ARTHROPLASTY;  Surgeon: Marchia Bond, MD;  Location: [place];  Service: Orthopedics;  Laterality: Right;   REVISION TOTAL HIP ARTHROPLASTY Left 10/03/2013   DR Mayer Camel   ROTATOR CUFF REPAIR     SCLEROTHERAPY  01/11/2022   Procedure: Clide Deutscher;  Surgeon: Gatha Mayer, MD;  Location: [facility] ENDOSCOPY;  Service: Gastroenterology;;   TOTAL HIP REVISION Left 10/03/2013   Procedure: TOTAL HIP REVISION- left;  Surgeon: Kerin Salen, MD;  Location: [place];  Service: Orthopedics;  Laterality: Left;   TOTAL KNEE ARTHROPLASTY Left 11/29/2017   Procedure: LEFT TOTAL KNEE ARTHROPLASTY;  Surgeon: Marchia Bond, MD;  Location: [place];  Service: Orthopedics;  Laterality: Left;   TOTAL SHOULDER ARTHROPLASTY Left 11/09/2016   Procedure: TOTAL REVERSE SHOULDER ARTHROPLASTY;  Surgeon: Marchia Bond, MD;  Location: [place];  Service: Orthopedics;  Laterality: Left;   TOTAL SHOULDER REPLACEMENT Left 10/2016   TOTAL THYROIDECTOMY     UPPER GASTROINTESTINAL ENDOSCOPY      Family History  Problem Relation Age of Onset   Diabetes Mother    Congestive Heart Failure Mother    Lung cancer Father    Thrombosis Sister    CAD Brother        CABG   Colon cancer Neg Hx    Esophageal cancer Neg Hx    Stomach cancer Neg Hx    Rectal cancer Neg Hx     Social History   Socioeconomic History   Marital status: Divorced    Spouse name: Not on file   Number of children: Not on file   Years of education: Not on file   Highest education level: Not on file  Occupational History   Occupation: disabled  Tobacco Use   Smoking status: Never    Passive exposure: Never   Smokeless tobacco: Never  Vaping Use   Vaping Use: Never used  Substance and Sexual Activity   Alcohol use: No   Drug use: No   Sexual activity: Not Currently    Birth control/protection: Surgical  Other Topics Concern   Not on file  Social History Narrative   Not on file   Social Determinants of Health   Financial Resource Strain: Low Risk   (09/14/2021)   Overall Financial Resource Strain (CARDIA)    Difficulty of Paying Living Expenses: Not hard at all  Food Insecurity: No Food Insecurity (09/14/2021)   Hunger Vital Sign    Worried About Running Out of Food in the Last Year: Never true    Ran Out of Food in the Last Year: Never true  Transportation Needs: No Transportation Needs (09/14/2021)   PRAPARE - Hydrologist (Medical): No  Lack of Transportation (Non-Medical): No  Physical Activity: Sufficiently Active (09/14/2021)   Exercise Vital Sign    Days of Exercise per Week: 6 days    Minutes of Exercise per Session: 40 min  Stress: No Stress Concern Present (09/14/2021)   [place]    Feeling of Stress : Not at all  Social Connections: Moderately Isolated (09/14/2021)   Social Connection and Isolation Panel [NHANES]    Frequency of Communication with Friends and Family: More than three times a week    Frequency of Social Gatherings with Friends and Family: More than three times a week    Attends Religious Services: More than 4 times per year    Active Member of [employer] or Organizations: No    Attends Archivist Meetings: Never    Marital Status: Divorced  Human resources officer Violence: Not At Risk (09/14/2021)   Humiliation, Afraid, Rape, and Kick questionnaire    Fear of Current or Ex-Partner: No    Emotionally Abused: No    Physically Abused: No    Sexually Abused: No    Outpatient Medications Prior to Visit  Medication Sig Dispense Refill   acetaminophen (TYLENOL) 650 MG CR tablet Take 1,300 mg by mouth at bedtime.     Adalimumab (HUMIRA PEN) 40 MG/0.4ML PNKT Inject 40 mg into the skin every Saturday.     ALPRAZolam (XANAX) 0.25 MG tablet Take 0.25 mg by mouth at bedtime as needed for anxiety or sleep.     Calcium Carbonate-Vitamin D (CALCIUM 600+D) 600-5 MG-MCG TABS Take 0.5-1 tablets by mouth See admin instructions.  Take one tablet by mouth twice daily - with breakfast and supper; take 1/2 tablet with lunch     Cholecalciferol (VITAMIN D3) 50 MCG (2000 UT) TABS Take 2,000 Units by mouth every morning.     denosumab (PROLIA) 60 MG/ML SOSY injection 23m Subcutaneous every 6 months 180 days (Patient taking differently: Inject 60 mg into the skin every 6 (six) months.) 1 mL 1   diclofenac Sodium (VOLTAREN) 1 % GEL APPLY 2 GRAMS TO AFFECTED AREA FOUR TIMES A DAY. (Patient taking differently: Apply 2 g topically at bedtime. Apply to hands, wrists, neck) 1050 g 3   ferrous sulfate 325 (65 FE) MG EC tablet Take 1 tablet (325 mg total) by mouth daily. (Patient not taking: Reported on 06/07/2022) 60 tablet 3   furosemide (LASIX) 20 MG tablet Take 1 tablet (20 mg total) by mouth 2 (two) times daily. 180 tablet 3   levothyroxine (SYNTHROID) 75 MCG tablet TAKE ONE TABLET BY MOUTH ONCE DAILY. 90 tablet 0   Multiple Vitamin (MULTIVITAMIN WITH MINERALS) TABS tablet Take 1 tablet by mouth every morning.     Omega-3 Fatty Acids (FISH OIL) 500 MG CAPS Take 500 mg by mouth every morning. (Patient not taking: Reported on 06/07/2022)     omeprazole (PRILOSEC) 40 MG capsule Take 1 capsule (40 mg total) by mouth daily before breakfast. Open capsule and swallow granules with liquid or applesauce 90 capsule 3   oxycodone-acetaminophen (LYNOX) 2.5-300 MG per tablet Take 1 tablet by mouth at bedtime as needed for pain.     Polyethyl Glycol-Propyl Glycol (SYSTANE) 0.4-0.3 % SOLN Place 1 drop into both eyes 2 (two) times daily.     potassium chloride SA (KLOR-CON M) 20 MEQ tablet TAKE 1 TABLET (20 MEQ) BY MOUTH DAILY PRN- EDEMA (Patient taking differently: Take 20 mEq by mouth daily as needed (with each dose  of Lasix).) 90 tablet 3   traMADol (ULTRAM) 50 MG tablet Take 50 mg by mouth every morning.     amLODipine (NORVASC) 5 MG tablet TAKE (1) TABLET BY MOUTH ONCE DAILY. 90 tablet 0   doxycycline (VIBRA-TABS) 100 MG tablet Take 1 tablet (100  mg total) by mouth 2 (two) times daily. (Patient not taking: Reported on 06/07/2022) 20 tablet `0   doxycycline (VIBRA-TABS) 100 MG tablet Take 1 tablet (100 mg total) by mouth 2 (two) times daily. (Patient not taking: Reported on 06/07/2022) 20 tablet 0   No facility-administered medications prior to visit.    Allergies  Allergen Reactions   Aspirin Nausea And Vomiting and Other (See Comments)    Adult Aspirin: upset stomach / irritates ulcer    Macrobid [Nitrofurantoin] Other (See Comments)    Stomach pain    ROS Review of Systems  Constitutional:  Negative for chills and fever.  HENT:  Negative for congestion, sinus pressure, sinus pain and sore throat.   Eyes:  Negative for pain and discharge.  Respiratory:  Negative for cough and shortness of breath.   Cardiovascular:  Negative for chest pain and palpitations.  Gastrointestinal:  Negative for abdominal pain, constipation, diarrhea, nausea and vomiting.  Endocrine: Negative for polydipsia and polyuria.  Genitourinary:  Negative for dysuria and hematuria.  Musculoskeletal:  Positive for arthralgias and back pain. Negative for neck pain and neck stiffness.  Skin:  Negative for rash.  Neurological:  Negative for dizziness and weakness.  Psychiatric/Behavioral:  Negative for agitation and behavioral problems.       Objective:    Physical Exam Vitals reviewed.  Constitutional:      General: She is not in acute distress.    Appearance: She is not diaphoretic.  HENT:     Head: Normocephalic and atraumatic.     Nose: Nose normal.     Mouth/Throat:     Mouth: Mucous membranes are moist.  Eyes:     General: No scleral icterus.    Extraocular Movements: Extraocular movements intact.     Pupils: Pupils are equal, round, and reactive to light.  Cardiovascular:     Rate and Rhythm: Normal rate and regular rhythm.     Pulses: Normal pulses.     Heart sounds: Normal heart sounds. No murmur heard. Pulmonary:     Breath sounds:  Normal breath sounds. No wheezing or rales.  Musculoskeletal:        General: Tenderness (Over MCPs and ICPs over b/l UE) and deformity (Ulnar deviation of b/l hands) present.     Cervical back: Neck supple. No tenderness.     Right lower leg: Edema (Mild) present.     Left lower leg: Edema (Mild) present.  Skin:    General: Skin is warm.     Findings: No rash.  Neurological:     General: No focal deficit present.     Mental Status: She is alert and oriented to person, place, and time.     Cranial Nerves: No cranial nerve deficit.     Sensory: No sensory deficit.     Motor: No weakness.  Psychiatric:        Mood and Affect: Mood normal.        Behavior: Behavior normal.     BP 113/71 (BP Location: Right Arm, Patient Position: Sitting, Cuff Size: Large)   Pulse 70   Ht 5' 1.5" (1.562 m)   Wt 164 lb 6.4 oz (74.6 kg)   SpO2 90%  BMI 30.56 kg/m  Wt Readings from Last 3 Encounters:  07/19/22 164 lb 6.4 oz (74.6 kg)  06/07/22 159 lb 6.4 oz (72.3 kg)  04/15/22 162 lb (73.5 kg)    Lab Results  Component Value Date   TSH 0.249 (L) 03/16/2022   Lab Results  Component Value Date   WBC 4.5 03/16/2022   HGB 10.4 (L) 03/16/2022   HCT 32.3 (L) 03/16/2022   MCV 96 03/16/2022   PLT 230 03/16/2022   Lab Results  Component Value Date   NA 140 03/16/2022   K 4.0 03/16/2022   CO2 24 03/16/2022   GLUCOSE 81 03/16/2022   BUN 13 03/16/2022   CREATININE 0.77 03/16/2022   BILITOT 0.9 01/10/2022   ALKPHOS 35 (L) 01/10/2022   AST 23 01/10/2022   ALT 14 01/10/2022   PROT 7.0 01/10/2022   ALBUMIN 2.7 (L) 01/10/2022   CALCIUM 9.6 03/16/2022   ANIONGAP 6 01/11/2022   EGFR 78 03/16/2022   Lab Results  Component Value Date   CHOL 134 03/16/2022   Lab Results  Component Value Date   HDL 57 03/16/2022   Lab Results  Component Value Date   LDLCALC 64 03/16/2022   Lab Results  Component Value Date   TRIG 59 03/16/2022   Lab Results  Component Value Date   CHOLHDL 2.4  03/16/2022   Lab Results  Component Value Date   HGBA1C 5.1 05/23/2017      Assessment & Plan:   Problem List Items Addressed This Visit       Cardiovascular and Mediastinum   Hypertension - Primary    BP Readings from Last 1 Encounters:  07/19/22 113/71  Well-controlled with Amlodipine Counseled for compliance with the medications Advised DASH diet and moderate exercise/walking, at least 150 mins/week      Relevant Medications   amLODipine (NORVASC) 5 MG tablet   Other Relevant Orders   CBC with Differential/Platelet   Basic Metabolic Panel (BMET)     Digestive   GERD (gastroesophageal reflux disease)    On omeprazole again Recent upper GI bleeding due to gastrojejunal ulcer, repeat EGD reviewed Followed by GI        Endocrine   Hypothyroid    Lab Results  Component Value Date   TSH 0.249 (L) 03/16/2022  TSH borderline low, no symptoms of overcorrection currently On Levothyroxine 88 mcg QD Check TSH and free T4 Followed by Dr. Chalmers Cater      Relevant Orders   TSH + free T4   Basic Metabolic Panel (BMET)     Musculoskeletal and Integument   Osteoporosis    Gets prolia from Endocrinology Continue calcium and Vit D supplements      Rheumatoid arthritis ([place])    Follows up with Rheumatologist - Dr Trudie Reed On Humira      Relevant Orders   CBC with Differential/Platelet   Basic Metabolic Panel (BMET)   Polyarthritis    Has chronic pain, has tramadol as needed for severe pain Going to see [place] [facility]        Other   Iron deficiency anemia due to chronic blood loss    Had acute blood loss anemia due to bleeding gastrojejunal ulcer, now resolved Used to take iron supplement Check CBC      Meds ordered this encounter  Medications   amLODipine (NORVASC) 5 MG tablet    Sig: Take 1 tablet (5 mg total) by mouth daily.    Dispense:  90 tablet  Refill:  3    Follow-up: Return in about 4 months (around 11/18/2022) for Annual physical.    Lindell Spar, MD

## 2022-07-19 NOTE — Assessment & Plan Note (Signed)
Gets prolia from Endocrinology Continue calcium and Vit D supplements

## 2022-07-19 NOTE — Assessment & Plan Note (Signed)
Follows up with Rheumatologist - Dr Trudie Reed On Humira

## 2022-07-20 LAB — CBC WITH DIFFERENTIAL/PLATELET
Basophils Absolute: 0 10*3/uL (ref 0.0–0.2)
Basos: 0 %
EOS (ABSOLUTE): 0.1 10*3/uL (ref 0.0–0.4)
Eos: 2 %
Hematocrit: 30.2 % — ABNORMAL LOW (ref 34.0–46.6)
Hemoglobin: 10.2 g/dL — ABNORMAL LOW (ref 11.1–15.9)
Immature Grans (Abs): 0 10*3/uL (ref 0.0–0.1)
Immature Granulocytes: 0 %
Lymphocytes Absolute: 3.3 10*3/uL — ABNORMAL HIGH (ref 0.7–3.1)
Lymphs: 64 %
MCH: 32.6 pg (ref 26.6–33.0)
MCHC: 33.8 g/dL (ref 31.5–35.7)
MCV: 97 fL (ref 79–97)
Monocytes Absolute: 0.5 10*3/uL (ref 0.1–0.9)
Monocytes: 11 %
Neutrophils Absolute: 1.2 10*3/uL — ABNORMAL LOW (ref 1.4–7.0)
Neutrophils: 23 %
Platelets: 223 10*3/uL (ref 150–450)
RBC: 3.13 x10E6/uL — ABNORMAL LOW (ref 3.77–5.28)
RDW: 14.6 % (ref 11.7–15.4)
WBC: 5.1 10*3/uL (ref 3.4–10.8)

## 2022-07-20 LAB — BASIC METABOLIC PANEL
BUN/Creatinine Ratio: 17 (ref 12–28)
BUN: 14 mg/dL (ref 8–27)
CO2: 23 mmol/L (ref 20–29)
Calcium: 8.9 mg/dL (ref 8.7–10.3)
Chloride: 102 mmol/L (ref 96–106)
Creatinine, Ser: 0.83 mg/dL (ref 0.57–1.00)
Glucose: 85 mg/dL (ref 70–99)
Potassium: 4.8 mmol/L (ref 3.5–5.2)
Sodium: 140 mmol/L (ref 134–144)
eGFR: 71 mL/min/{1.73_m2} (ref >59)

## 2022-07-20 LAB — TSH+FREE T4
Free T4: 1.15 ng/dL (ref 0.82–1.77)
TSH: 5.82 u[IU]/mL — ABNORMAL HIGH (ref 0.450–4.500)

## 2022-08-05 DIAGNOSIS — Z79899 Other long term (current) drug therapy: Secondary | ICD-10-CM | POA: Diagnosis not present

## 2022-08-05 DIAGNOSIS — M069 Rheumatoid arthritis, unspecified: Secondary | ICD-10-CM | POA: Diagnosis not present

## 2022-08-05 DIAGNOSIS — E559 Vitamin D deficiency, unspecified: Secondary | ICD-10-CM | POA: Diagnosis not present

## 2022-08-05 DIAGNOSIS — G894 Chronic pain syndrome: Secondary | ICD-10-CM | POA: Diagnosis not present

## 2022-08-09 DIAGNOSIS — Z79899 Other long term (current) drug therapy: Secondary | ICD-10-CM | POA: Diagnosis not present

## 2022-08-11 ENCOUNTER — Other Ambulatory Visit: Payer: Self-pay | Admitting: Internal Medicine

## 2022-08-11 DIAGNOSIS — M1990 Unspecified osteoarthritis, unspecified site: Secondary | ICD-10-CM

## 2022-08-19 DIAGNOSIS — G894 Chronic pain syndrome: Secondary | ICD-10-CM | POA: Diagnosis not present

## 2022-08-19 DIAGNOSIS — E892 Postprocedural hypoparathyroidism: Secondary | ICD-10-CM | POA: Diagnosis not present

## 2022-08-19 DIAGNOSIS — Z79899 Other long term (current) drug therapy: Secondary | ICD-10-CM | POA: Diagnosis not present

## 2022-08-19 DIAGNOSIS — E890 Postprocedural hypothyroidism: Secondary | ICD-10-CM | POA: Diagnosis not present

## 2022-08-19 DIAGNOSIS — M069 Rheumatoid arthritis, unspecified: Secondary | ICD-10-CM | POA: Diagnosis not present

## 2022-08-19 DIAGNOSIS — E876 Hypokalemia: Secondary | ICD-10-CM | POA: Diagnosis not present

## 2022-08-20 LAB — LAB REPORT - SCANNED: EGFR: 82

## 2022-08-26 DIAGNOSIS — E892 Postprocedural hypoparathyroidism: Secondary | ICD-10-CM | POA: Diagnosis not present

## 2022-08-26 DIAGNOSIS — C73 Malignant neoplasm of thyroid gland: Secondary | ICD-10-CM | POA: Diagnosis not present

## 2022-08-26 DIAGNOSIS — E890 Postprocedural hypothyroidism: Secondary | ICD-10-CM | POA: Diagnosis not present

## 2022-08-26 DIAGNOSIS — M810 Age-related osteoporosis without current pathological fracture: Secondary | ICD-10-CM | POA: Diagnosis not present

## 2022-09-15 ENCOUNTER — Ambulatory Visit (INDEPENDENT_AMBULATORY_CARE_PROVIDER_SITE_OTHER): Payer: 59 | Admitting: Internal Medicine

## 2022-09-15 ENCOUNTER — Encounter: Payer: Self-pay | Admitting: Internal Medicine

## 2022-09-15 ENCOUNTER — Encounter: Payer: 59 | Admitting: Family Medicine

## 2022-09-15 VITALS — BP 123/71 | HR 68 | Resp 16 | Ht 61.0 in | Wt 156.0 lb

## 2022-09-15 DIAGNOSIS — M79672 Pain in left foot: Secondary | ICD-10-CM | POA: Diagnosis not present

## 2022-09-15 DIAGNOSIS — M2042 Other hammer toe(s) (acquired), left foot: Secondary | ICD-10-CM | POA: Insufficient documentation

## 2022-09-15 DIAGNOSIS — Z0000 Encounter for general adult medical examination without abnormal findings: Secondary | ICD-10-CM | POA: Diagnosis not present

## 2022-09-15 NOTE — Assessment & Plan Note (Signed)
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 She has pain in the distal end of her foot on the  plantar surface. Will refer to sports medicine for consideration of padding/orthotics.

## 2022-09-15 NOTE — Progress Notes (Signed)
   HPI:Kathy [name] is a 82 y.o. female who presents for evaluation of foot pain. She has had left foot pain for 2 month. It hurts the more she walks and when she is standing. She uses a heel insert in her left shoe and this provides some relief.   Foot Pain (X 1-2 months. Around 07/31/22 started having Left foot pain under the ball of her foot especially if she does a lot of walking. Has been using a heel insert in her shoe and that will provide some relief) . For the details of today's visit, please refer to the assessment and plan.  Physical Exam: Vitals:   09/15/22 1556  BP: 123/71  Pulse: 68  Resp: 16  SpO2: 94%  Weight: 156 lb (70.8 kg)  Height: '5\' 1"'$  (1.549 m)     Physical Exam Constitutional:      Comments: Well kept, hard of hearing  Feet:     Left foot:     Skin integrity: Callus (plantar surface) present.     Comments: Hammer toe deformity. Loss of transverse arch.      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 She has pain in the distal end of her foot on the  plantar surface. Will refer to sports medicine for consideration of padding/orthotics.    Lorene Dy, MD

## 2022-09-15 NOTE — Progress Notes (Signed)
Subjective:   Kathy [name] is a 82 y.o. female who presents for Medicare Annual (Subsequent) preventive examination.  Review of Systems    Review of Systems  All other systems reviewed and are negative.   Objective:    Today's Vitals   09/15/22 1617  PainSc: 6    There is no height or weight on file to calculate BMI.     09/15/2022    4:20 PM 01/10/2022    2:09 PM 09/14/2021    3:55 PM 08/25/2020   10:50 AM 11/29/2017    5:55 AM 11/18/2017    9:30 AM 06/15/2017   12:29 PM  Advanced Directives  Does Patient Have a Medical Advance Directive? No No No No No No No  Would patient like information on creating a medical advance directive? Yes (MAU/Ambulatory/Procedural Areas - Information given) No - Patient declined No - Patient declined No - Patient declined No - Patient declined Yes (MAU/Ambulatory/Procedural Areas - Information given) No - Patient declined    Current Medications (verified) Outpatient Encounter Medications as of 09/15/2022  Medication Sig   acetaminophen (TYLENOL) 650 MG CR tablet Take 1,300 mg by mouth at bedtime.   Adalimumab (HUMIRA PEN) 40 MG/0.4ML PNKT Inject 40 mg into the skin every Saturday.   ALPRAZolam (XANAX) 0.25 MG tablet Take 0.25 mg by mouth at bedtime as needed for anxiety or sleep.   amLODipine (NORVASC) 5 MG tablet Take 1 tablet (5 mg total) by mouth daily.   Calcium Carbonate-Vitamin D (CALCIUM 600+D) 600-5 MG-MCG TABS Take 0.5-1 tablets by mouth See admin instructions. Take one tablet by mouth twice daily - with breakfast and supper; take 1/2 tablet with lunch   Cholecalciferol (VITAMIN D3) 50 MCG (2000 UT) TABS Take 2,000 Units by mouth every morning.   denosumab (PROLIA) 60 MG/ML SOSY injection 49m Subcutaneous every 6 months 180 days (Patient taking differently: Inject 60 mg into the skin every 6 (six) months.)   diclofenac Sodium (VOLTAREN) 1 % GEL APPLY (1) GRAM TO AFFECTED AREA DAILY.   ferrous sulfate 325 (65 FE) MG EC tablet Take 1  tablet (325 mg total) by mouth daily. (Patient not taking: Reported on 06/07/2022)   furosemide (LASIX) 20 MG tablet Take 1 tablet (20 mg total) by mouth 2 (two) times daily.   levothyroxine (SYNTHROID) 75 MCG tablet TAKE ONE TABLET BY MOUTH ONCE DAILY.   Multiple Vitamin (MULTIVITAMIN WITH MINERALS) TABS tablet Take 1 tablet by mouth every morning.   Omega-3 Fatty Acids (FISH OIL) 500 MG CAPS Take 500 mg by mouth every morning. (Patient not taking: Reported on 06/07/2022)   omeprazole (PRILOSEC) 40 MG capsule Take 1 capsule (40 mg total) by mouth daily before breakfast. Open capsule and swallow granules with liquid or applesauce   oxycodone-acetaminophen (LYNOX) 2.5-300 MG per tablet Take 1 tablet by mouth at bedtime as needed for pain.   Polyethyl Glycol-Propyl Glycol (SYSTANE) 0.4-0.3 % SOLN Place 1 drop into both eyes 2 (two) times daily.   potassium chloride SA (KLOR-CON M) 20 MEQ tablet TAKE 1 TABLET (20 MEQ) BY MOUTH DAILY PRN- EDEMA (Patient taking differently: Take 20 mEq by mouth daily as needed (with each dose of Lasix).)   traMADol (ULTRAM) 50 MG tablet Take 50 mg by mouth every morning.   No facility-administered encounter medications on file as of 09/15/2022.    Allergies (verified) Aspirin and Macrobid [nitrofurantoin]   History: Past Medical History:  Diagnosis Date   Abnormal findings on diagnostic imaging of heart  and coronary circulation    Anemia    FROM BLEEDING ULCER   Anxiety    takes Alprazolam daily as needed   Arthritis    dx with RA 2017   Bariatric surgery status    Cataract    Diverticulosis    Gastro-esophageal reflux disease with esophagitis    Gastrojejunal ulcer with hemorrhage    Headache(784.0)    occasionally   History of blood transfusion    no abnormal reaction noted   History of bronchitis    last time many yrs ago   Hyperlipidemia    PT DENIES THIS DX -  ON NO MEDS AND NO ONE HAS TOLD HER   Hypertension    takes Amlodipine daily    Hypothyroidism    takes Synthroid daily   Insomnia    Joint pain    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   Localized edema    Nocturia    Numbness    occasionally left arm at night   Obesity    Osteoporosis    takes Fosamax weekly   Pain in joint involving pelvic region and thigh    Peripheral edema    takes Lasix daily as needed   PONV (postoperative nausea and vomiting)    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/2017   Rheumatoid arthritis, unspecified (HCC)    Rotator cuff arthropathy, 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   Sleep apnea    Phreesia 08/22/2020   Stomach ulcer    Thyroid disease    Phreesia 08/22/2020   Unspecified injury of muscle(s) and tendon(s) of the rotator cuff of left shoulder, subsequent encounter    Unspecified osteoarthritis, unspecified site    Wears glasses    Wears partial dentures    Past Surgical History:  Procedure Laterality Date   ABDOMINAL HYSTERECTOMY     partial   cataract surgery Bilateral    CHOLECYSTECTOMY     COLONOSCOPY     ESOPHAGOGASTRODUODENOSCOPY (EGD) WITH PROPOFOL N/A 01/11/2022   Procedure: ESOPHAGOGASTRODUODENOSCOPY (EGD) WITH PROPOFOL;  Surgeon: Gatha Mayer, MD;  Location: [facility];  Service: Gastroenterology;  Laterality: N/A;   EYE SURGERY     CATARACTS BOTH   gastric bypass surgery     HOT HEMOSTASIS N/A 01/11/2022   Procedure: HOT HEMOSTASIS (ARGON PLASMA COAGULATION/BICAP);  Surgeon: Gatha Mayer, MD;  Location: [facility];  Service: Gastroenterology;  Laterality: N/A;   JOINT REPLACEMENT Bilateral    hip   REVERSE SHOULDER ARTHROPLASTY Right 05/11/2016   Procedure: REVERSE SHOULDER ARTHROPLASTY;  Surgeon: Marchia Bond, MD;  Location: [place];  Service: Orthopedics;  Laterality: Right;   REVISION TOTAL HIP ARTHROPLASTY Left 10/03/2013   DR Mayer Camel   ROTATOR CUFF REPAIR     SCLEROTHERAPY  01/11/2022   Procedure: Clide Deutscher;  Surgeon: Gatha Mayer, MD;  Location: [facility];  Service:  Gastroenterology;;   TOTAL HIP REVISION Left 10/03/2013   Procedure: TOTAL HIP REVISION- left;  Surgeon: Kerin Salen, MD;  Location: [place];  Service: Orthopedics;  Laterality: Left;   TOTAL KNEE ARTHROPLASTY Left 11/29/2017   Procedure: LEFT TOTAL KNEE ARTHROPLASTY;  Surgeon: Marchia Bond, MD;  Location: [place];  Service: Orthopedics;  Laterality: Left;   TOTAL SHOULDER ARTHROPLASTY Left 11/09/2016   Procedure: TOTAL REVERSE SHOULDER ARTHROPLASTY;  Surgeon: Marchia Bond, MD;  Location: [place];  Service: Orthopedics;  Laterality: Left;   TOTAL SHOULDER REPLACEMENT Left 10/2016   TOTAL THYROIDECTOMY     UPPER GASTROINTESTINAL ENDOSCOPY  Family History  Problem Relation Age of Onset   Diabetes Mother    Congestive Heart Failure Mother    Lung cancer Father    Thrombosis Sister    CAD Brother        CABG   Colon cancer Neg Hx    Esophageal cancer Neg Hx    Stomach cancer Neg Hx    Rectal cancer Neg Hx    Social History   Socioeconomic History   Marital status: Divorced    Spouse name: Not on file   Number of children: Not on file   Years of education: Not on file   Highest education level: Not on file  Occupational History   Occupation: disabled  Tobacco Use   Smoking status: Never    Passive exposure: Never   Smokeless tobacco: Never  Vaping Use   Vaping Use: Never used  Substance and Sexual Activity   Alcohol use: No   Drug use: No   Sexual activity: Not Currently    Birth control/protection: Surgical  Other Topics Concern   Not on file  Social History Narrative   Not on file   Social Determinants of Health   Financial Resource Strain: Low Risk  (09/14/2021)   Overall Financial Resource Strain (CARDIA)    Difficulty of Paying Living Expenses: Not hard at all  Food Insecurity: No Food Insecurity (09/14/2021)   Hunger Vital Sign    Worried About Running Out of Food in the Last Year: Never true    [place] in the Last Year: Never true  Transportation  Needs: No Transportation Needs (09/14/2021)   PRAPARE - Hydrologist (Medical): No    Lack of Transportation (Non-Medical): No  Physical Activity: Sufficiently Active (09/14/2021)   Exercise Vital Sign    Days of Exercise per Week: 6 days    Minutes of Exercise per Session: 40 min  Stress: No Stress Concern Present (09/14/2021)   [place]    Feeling of Stress : Not at all  Social Connections: Moderately Isolated (09/14/2021)   Social Connection and Isolation Panel [NHANES]    Frequency of Communication with Friends and Family: More than three times a week    Frequency of Social Gatherings with Friends and Family: More than three times a week    Attends Religious Services: More than 4 times per year    Active Member of [employer] or Organizations: No    Attends Music therapist: Never    Marital Status: Divorced    Tobacco Counseling Counseling given: Not Answered   Clinical Intake:  Pre-visit preparation completed: Yes  Pain : 0-10 Pain Score: 6  (chronic pain from RA) Pain Type: Chronic pain     Diabetes: No  How often do you need to have someone help you when you read instructions, pamphlets, or other written materials from your doctor or pharmacy?: 1 - Never What is the last grade level you completed in school?: [employer], medical technology  Diabetic?No   Activities of Daily Living    09/15/2022    4:23 PM 01/10/2022   10:00 PM  In your present state of health, do you have any difficulty performing the following activities:  Hearing? 0 0  Vision? 0 0  Difficulty concentrating or making decisions? 0 0  Walking or climbing stairs? 0 0  Dressing or bathing? 0 0  Doing errands, shopping? 0 0    Patient  Care Team: Lindell Spar, MD as PCP - General (Internal Medicine) Harl Bowie Alphonse Guild, MD as PCP - Cardiology (Cardiology)  Indicate any recent Medical Services you  may have received from other than [facility] providers in the past year (date may be approximate).     Assessment:   This is a routine wellness examination for [place].  Hearing/Vision screen No results found.  Dietary issues and exercise activities discussed:     Goals Addressed   None    Depression Screen    09/15/2022    4:23 PM 09/15/2022    3:58 PM 07/19/2022    1:47 PM 03/16/2022    2:21 PM 01/21/2022   11:10 AM 09/16/2021    2:12 PM 09/14/2021    3:56 PM  PHQ 2/9 Scores  PHQ - 2 Score 0 0 0 0 0 0 0    Fall Risk    09/15/2022    4:23 PM 09/15/2022    3:57 PM 07/19/2022    1:47 PM 03/16/2022    2:21 PM 01/21/2022   11:10 AM  [place] in the past year? 0 0 0 0 0  Number falls in past yr: 0 0 0 0 0  Injury with Fall? 0 0 0 0 0  Risk for fall due to :  Impaired mobility  No Fall Risks No Fall Risks  Follow up    Falls evaluation completed Falls evaluation completed    [place]:  Any stairs in or around the home? Yes  If so, are there any without handrails? Yes  Home free of loose throw rugs in walkways, pet beds, electrical cords, etc? Yes  Adequate lighting in your home to reduce risk of falls? Yes   ASSISTIVE DEVICES UTILIZED TO PREVENT FALLS:  Life alert? Yes  Use of a cane, walker or w/c? Yes  Grab bars in the bathroom? Yes  Shower chair or bench in shower? Yes  Elevated toilet seat or a handicapped toilet? Yes    Cognitive Function:    09/14/2021    3:56 PM  MMSE - Mini Mental State Exam  Not completed: Unable to complete        09/15/2022    4:24 PM 09/14/2021    3:56 PM  6CIT Screen  What Year? 0 points 0 points  What month? 0 points 0 points  What time? 0 points 0 points  Count back from 20 0 points 0 points  Months in reverse 0 points 0 points  Repeat phrase 0 points 0 points  Total Score 0 points 0 points    Immunizations Immunization History  Administered Date(s) Administered   Fluad Quad(high  Dose 65+) 05/14/2020, 05/08/2021, 04/28/2022   Influenza, High Dose Seasonal PF 05/06/2017, 05/01/2018   Influenza-Unspecified 05/14/2011, 04/04/2012, 04/23/2013, 05/17/2014, 05/01/2015, 04/20/2016, 05/02/2018, 04/03/2019   PFIZER(Purple Top)SARS-COV-2 Vaccination 08/24/2019, 09/09/2019, 05/01/2020   PPD Test 05/13/2016, 05/27/2016, 11/12/2016   Pneumococcal Conjugate-13 01/24/2014, 05/16/2020   Pneumococcal Polysaccharide-23 03/27/2012, 11/12/2016, 08/09/2017   Tdap 01/24/2014   Zoster Recombinat (Shingrix) 04/07/2018, 09/04/2018    TDAP status: Up to date  Flu Vaccine status: Up to date  Pneumococcal vaccine status: Up to date  Covid-19 vaccine status: Information provided on how to obtain vaccines.   Qualifies for Shingles Vaccine? Yes   Zostavax completed No   Shingrix Completed?: Yes  Screening Tests Health Maintenance  Topic Date Due   COVID-19 Vaccine (4 - 2023-24 season) 04/02/2022   Medicare Annual  Wellness (AWV)  09/16/2023   DTaP/Tdap/Td (2 - Td or Tdap) 01/25/2024   Pneumonia Vaccine 27+ Years old  Completed   INFLUENZA VACCINE  Completed   DEXA SCAN  Completed   Zoster Vaccines- Shingrix  Completed   HPV VACCINES  Aged Out    Health Maintenance  Health Maintenance Due  Topic Date Due   COVID-19 Vaccine (4 - 2023-24 season) 04/02/2022    Colorectal cancer screening: No longer required.   Mammogram status: Completed 12/15/21. Repeat every year  Bone Density status: Completed 08/20/2021. Results reflect: Bone density results: OSTEOPOROSIS. Repeat every 2 years.  Lung Cancer Screening: (Low Dose CT Chest recommended if Age 28-80 years, 30 pack-year currently smoking OR have quit w/in 15years.) does not qualify.   Additional Screening:  Hepatitis C Screening: does not qualif  Vision Screening: Recommended annual ophthalmology exams for early detection of glaucoma and other disorders of the eye. Is the patient up to date with their annual eye exam?  Yes   Who is the provider or what is the name of the office in which the patient attends annual eye exams? [facility] If pt is not established with a provider, would they like to be referred to a provider to establish care? No .   Dental Screening: Recommended annual dental exams for proper oral hygiene  Community Resource Referral / Chronic Care Management: CRR required this visit?  No   CCM required this visit?  No      Plan:     I have personally reviewed and noted the following in the patient's chart:   Medical and social history Use of alcohol, tobacco or illicit drugs  Current medications and supplements including opioid prescriptions. Patient is not currently taking opioid prescriptions. Functional ability and status Nutritional status Physical activity Advanced directives List of other physicians Hospitalizations, surgeries, and ER visits in previous 12 months Vitals Screenings to include cognitive, depression, and falls Referrals and appointments  In addition, I have reviewed and discussed with patient certain preventive protocols, quality metrics, and best practice recommendations. A written personalized care plan for preventive services as well as general preventive health recommendations were provided to patient.     Lorene Dy, MD   09/15/2022

## 2022-09-15 NOTE — Patient Instructions (Addendum)
Thank you, [facility] for allowing [place] to provide your care today.    Referrals ordered today:    Referral Orders         Ambulatory referral to Sports Medicine          Tamsen Snider, M.D.

## 2022-09-15 NOTE — Patient Instructions (Signed)
  Kathy [name] , Thank you for taking time to come for your Medicare Wellness Visit. I appreciate your ongoing commitment to your health goals. Please review the following plan we discussed and let me know if I can assist you in the future.   These are the goals we discussed:She would like to lose some weight. She is going to try and walk more.   This is a list of the screening recommended for you and due dates:  Health Maintenance  Topic Date Due   COVID-19 Vaccine (4 - 2023-24 season) 04/02/2022   Medicare Annual Wellness Visit  09/16/2023   DTaP/Tdap/Td vaccine (2 - Td or Tdap) 01/25/2024   Pneumonia Vaccine  Completed   Flu Shot  Completed   DEXA scan (bone density measurement)  Completed   Zoster (Shingles) Vaccine  Completed   HPV Vaccine  Aged Out

## 2022-09-16 DIAGNOSIS — M069 Rheumatoid arthritis, unspecified: Secondary | ICD-10-CM | POA: Diagnosis not present

## 2022-09-16 DIAGNOSIS — R030 Elevated blood-pressure reading, without diagnosis of hypertension: Secondary | ICD-10-CM | POA: Diagnosis not present

## 2022-09-16 DIAGNOSIS — G894 Chronic pain syndrome: Secondary | ICD-10-CM | POA: Diagnosis not present

## 2022-09-16 DIAGNOSIS — Z79899 Other long term (current) drug therapy: Secondary | ICD-10-CM | POA: Diagnosis not present

## 2022-09-28 ENCOUNTER — Ambulatory Visit: Payer: 59 | Attending: Audiology | Admitting: Audiology

## 2022-09-28 DIAGNOSIS — H903 Sensorineural hearing loss, bilateral: Secondary | ICD-10-CM | POA: Diagnosis not present

## 2022-09-28 NOTE — Procedures (Signed)
  Outpatient Audiology and [facility], [place]  [id number] [phone]  AUDIOLOGICAL  EVALUATION  NAME: Kathy [name]     DOB:   1940-08-20      MRN: [id number]                                                                                     DATE: 09/28/2022     REFERENT: Lindell Spar, MD STATUS: Outpatient DIAGNOSIS: Sensorineural hearing loss, bilateral    History: Kathy [name] was seen for an audiological evaluation due to decreased hearing. Kathy [name] reports her left ear has been her "dead" ear for many years. Kathy [name] reports she has no hearing in the left ear. Kathy [name] reports a decrease in her hearing sensitivity in the right ear. Kathy [name] reports bilateral aural fullness. Kathy [name] has a history of cerumen impaction and has been followed by Dr. Wilburn Cornelia at [facility] [place]. Kathy [name] denies dizziness, otalgia, and tinnitus.   Evaluation:  Otoscopy showed a clear view of the tympanic membrane in the left ear and non-occluding cerumen in the right ear.  Tympanometry results were consistent with normal middle ear pressure and normal tympanic membrane mobility (Type A), bilaterally.  Audiometric testing was completed using Conventional Audiometry techniques with insert earphones and TDH headphones. Test results are consistent with a mild to moderately severe sensorineural hearing loss in the right ear and a profound sensorineural hearing loss in the left ear. Speech Recognition Thresholds were obtained at 40  dB HL in the left ear and an SRT and SDT could not be obtained in the left ear due to the severity of the hearing loss. Word Recognition Testing was completed at 80 dB HL and Kathy [name] scored 96% in the right ear.    Results:  The test results were reviewed with Kathy [name]. Today's test are consistent with a mild to moderately severe sensorineural hearing loss in the right ear and a profound sensorineural hearing loss in the left ear. Sensorineural  asymmetry noted at [phone] Hz worse in the left ear. This degree of hearing loss will cause hearing and communication difficulty in all listening environments. Kathy [name] will benefit from the use of amplification and good communication strategies. Kathy [name] was encouraged to call her insurance to inquire about a [facility] hearing aid benefit. Kathy [name] was given a list of hearing aid providers in the [place] area.   Recommendations: 1.   Communication Needs Assessment with an Audiologist to discuss a hearing aid for the right ear.  2.   Continue to monitor hearing sensitivity.    35 minutes spent testing and counseling on results.   If you have any questions please feel free to contact me at [phone].  Bari Mantis Audiologist, Au.D., CCC-A 09/28/2022  2:44 PM  Cc: Lindell Spar, MD

## 2022-09-29 ENCOUNTER — Ambulatory Visit: Payer: 59

## 2022-10-01 ENCOUNTER — Telehealth: Payer: Self-pay | Admitting: Internal Medicine

## 2022-10-06 ENCOUNTER — Ambulatory Visit (INDEPENDENT_AMBULATORY_CARE_PROVIDER_SITE_OTHER): Payer: 59 | Admitting: Internal Medicine

## 2022-10-06 ENCOUNTER — Other Ambulatory Visit (HOSPITAL_COMMUNITY): Payer: Self-pay

## 2022-10-06 ENCOUNTER — Encounter: Payer: Self-pay | Admitting: Internal Medicine

## 2022-10-06 VITALS — BP 108/70 | HR 73 | Ht 61.0 in | Wt 165.8 lb

## 2022-10-06 DIAGNOSIS — H6121 Impacted cerumen, right ear: Secondary | ICD-10-CM | POA: Diagnosis not present

## 2022-10-06 DIAGNOSIS — H903 Sensorineural hearing loss, bilateral: Secondary | ICD-10-CM | POA: Insufficient documentation

## 2022-10-06 DIAGNOSIS — F419 Anxiety disorder, unspecified: Secondary | ICD-10-CM

## 2022-10-06 MED ORDER — ALPRAZOLAM 0.25 MG PO TABS: 0.2500 mg | ORAL_TABLET | Freq: Two times a day (BID) | ORAL | 2 refills | Status: DC | PRN

## 2022-10-06 NOTE — Progress Notes (Signed)
Acute Office Visit  Subjective:    Patient ID: Kathy [name], female    DOB: 1941/07/30, 82 y.o.   MRN: [id number]  Chief Complaint  Patient presents with   Cerumen Impaction    Patient states she has wax build up in right ear.    HPI Patient is in today for current complaint of right-sided ear clogging for the last 1 week.  She denies any ear pain or discharge currently.  She has had excess earwax in the past, which has required ear irrigation by ENT specialist.  She has chronic hearing loss, and had audiology testing recently, which showed bilateral sensorineural hearing loss.  She has history of GAD, and takes Xanax 0.25 mg twice daily.  She used to get it from [facility] neurology.  She requests refill of Xanax as her neurology clinic has closed.  She denies anhedonia, SI or HI currently.  Past Medical History:  Diagnosis Date   Abnormal findings on diagnostic imaging of heart and coronary circulation    Anemia    FROM BLEEDING ULCER   Anxiety    takes Alprazolam daily as needed   Arthritis    dx with RA 2017   Bariatric surgery status    Cataract    Diverticulosis    Gastro-esophageal reflux disease with esophagitis    Gastrojejunal ulcer with hemorrhage    Headache(784.0)    occasionally   History of blood transfusion    no abnormal reaction noted   History of bronchitis    last time many yrs ago   Hyperlipidemia    PT DENIES THIS DX -  ON NO MEDS AND NO ONE HAS TOLD HER   Hypertension    takes Amlodipine daily   Hypothyroidism    takes Synthroid daily   Insomnia    Joint pain    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   Localized edema    Nocturia    Numbness    occasionally left arm at night   Obesity    Osteoporosis    takes Fosamax weekly   Pain in joint involving pelvic region and thigh    Peripheral edema    takes Lasix daily as needed   PONV (postoperative nausea and vomiting)    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/2017    Rheumatoid arthritis, unspecified (HCC)    Rotator cuff arthropathy, 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   Sleep apnea    Phreesia 08/22/2020   Stomach ulcer    Thyroid disease    Phreesia 08/22/2020   Unspecified injury of muscle(s) and tendon(s) of the rotator cuff of left shoulder, subsequent encounter    Unspecified osteoarthritis, unspecified site    Wears glasses    Wears partial dentures     Past Surgical History:  Procedure Laterality Date   ABDOMINAL HYSTERECTOMY     partial   cataract surgery Bilateral    CHOLECYSTECTOMY     COLONOSCOPY     ESOPHAGOGASTRODUODENOSCOPY (EGD) WITH PROPOFOL N/A 01/11/2022   Procedure: ESOPHAGOGASTRODUODENOSCOPY (EGD) WITH PROPOFOL;  Surgeon: Gatha Mayer, MD;  Location: [place];  Service: Gastroenterology;  Laterality: N/A;   EYE SURGERY     CATARACTS BOTH   gastric bypass surgery     HOT HEMOSTASIS N/A 01/11/2022   Procedure: HOT HEMOSTASIS (ARGON PLASMA COAGULATION/BICAP);  Surgeon: Gatha Mayer, MD;  Location: [place];  Service: Gastroenterology;  Laterality: N/A;   JOINT REPLACEMENT Bilateral    hip   REVERSE SHOULDER ARTHROPLASTY Right 05/11/2016  Procedure: REVERSE SHOULDER ARTHROPLASTY;  Surgeon: Marchia Bond, MD;  Location: [place];  Service: Orthopedics;  Laterality: Right;   REVISION TOTAL HIP ARTHROPLASTY Left 10/03/2013   DR Mayer Camel   ROTATOR CUFF REPAIR     SCLEROTHERAPY  01/11/2022   Procedure: Clide Deutscher;  Surgeon: Gatha Mayer, MD;  Location: [facility] ENDOSCOPY;  Service: Gastroenterology;;   TOTAL HIP REVISION Left 10/03/2013   Procedure: TOTAL HIP REVISION- left;  Surgeon: Kerin Salen, MD;  Location: [place];  Service: Orthopedics;  Laterality: Left;   TOTAL KNEE ARTHROPLASTY Left 11/29/2017   Procedure: LEFT TOTAL KNEE ARTHROPLASTY;  Surgeon: Marchia Bond, MD;  Location: [place];  Service: Orthopedics;  Laterality: Left;   TOTAL SHOULDER ARTHROPLASTY Left 11/09/2016   Procedure: TOTAL REVERSE SHOULDER ARTHROPLASTY;   Surgeon: Marchia Bond, MD;  Location: [place];  Service: Orthopedics;  Laterality: Left;   TOTAL SHOULDER REPLACEMENT Left 10/2016   TOTAL THYROIDECTOMY     UPPER GASTROINTESTINAL ENDOSCOPY      Family History  Problem Relation Age of Onset   Diabetes Mother    Congestive Heart Failure Mother    Lung cancer Father    Thrombosis Sister    CAD Brother        CABG   Colon cancer Neg Hx    Esophageal cancer Neg Hx    Stomach cancer Neg Hx    Rectal cancer Neg Hx     Social History   Socioeconomic History   Marital status: Divorced    Spouse name: Not on file   Number of children: Not on file   Years of education: Not on file   Highest education level: Not on file  Occupational History   Occupation: disabled  Tobacco Use   Smoking status: Never    Passive exposure: Never   Smokeless tobacco: Never  Vaping Use   Vaping Use: Never used  Substance and Sexual Activity   Alcohol use: No   Drug use: No   Sexual activity: Not Currently    Birth control/protection: Surgical  Other Topics Concern   Not on file  Social History Narrative   Not on file   Social Determinants of Health   Financial Resource Strain: Low Risk  (09/14/2021)   Overall Financial Resource Strain (CARDIA)    Difficulty of Paying Living Expenses: Not hard at all  Food Insecurity: No Food Insecurity (09/14/2021)   Hunger Vital Sign    Worried About Running Out of Food in the Last Year: Never true    Ran Out of Food in the Last Year: Never true  Transportation Needs: No Transportation Needs (09/14/2021)   PRAPARE - Hydrologist (Medical): No    Lack of Transportation (Non-Medical): No  Physical Activity: Sufficiently Active (09/14/2021)   Exercise Vital Sign    Days of Exercise per Week: 6 days    Minutes of Exercise per Session: 40 min  Stress: No Stress Concern Present (09/14/2021)   [place]    Feeling of  Stress : Not at all  Social Connections: Moderately Isolated (09/14/2021)   Social Connection and Isolation Panel [NHANES]    Frequency of Communication with Friends and Family: More than three times a week    Frequency of Social Gatherings with Friends and Family: More than three times a week    Attends Religious Services: More than 4 times per year    Active Member of Clubs or Organizations: No  Attends Archivist Meetings: Never    Marital Status: Divorced  Human resources officer Violence: Not At Risk (09/14/2021)   Humiliation, Afraid, Rape, and Kick questionnaire    Fear of Current or Ex-Partner: No    Emotionally Abused: No    Physically Abused: No    Sexually Abused: No    Outpatient Medications Prior to Visit  Medication Sig Dispense Refill   acetaminophen (TYLENOL) 650 MG CR tablet Take 1,300 mg by mouth at bedtime.     Adalimumab (HUMIRA PEN) 40 MG/0.4ML PNKT Inject 40 mg into the skin every Saturday.     amLODipine (NORVASC) 5 MG tablet Take 1 tablet (5 mg total) by mouth daily. 90 tablet 3   Calcium Carbonate-Vitamin D (CALCIUM 600+D) 600-5 MG-MCG TABS Take 0.5-1 tablets by mouth See admin instructions. Take one tablet by mouth twice daily - with breakfast and supper; take 1/2 tablet with lunch     Cholecalciferol (VITAMIN D3) 50 MCG (2000 UT) TABS Take 2,000 Units by mouth every morning.     denosumab (PROLIA) 60 MG/ML SOSY injection '60mg'$  Subcutaneous every 6 months 180 days (Patient taking differently: Inject 60 mg into the skin every 6 (six) months.) 1 mL 1   diclofenac Sodium (VOLTAREN) 1 % GEL APPLY (1) GRAM TO AFFECTED AREA DAILY. 100 g 0   ferrous sulfate 325 (65 FE) MG EC tablet Take 1 tablet (325 mg total) by mouth daily. (Patient not taking: Reported on 06/07/2022) 60 tablet 3   furosemide (LASIX) 20 MG tablet Take 1 tablet (20 mg total) by mouth 2 (two) times daily. 180 tablet 3   levothyroxine (SYNTHROID) 75 MCG tablet TAKE ONE TABLET BY MOUTH ONCE DAILY. 90  tablet 0   Multiple Vitamin (MULTIVITAMIN WITH MINERALS) TABS tablet Take 1 tablet by mouth every morning.     Omega-3 Fatty Acids (FISH OIL) 500 MG CAPS Take 500 mg by mouth every morning. (Patient not taking: Reported on 06/07/2022)     omeprazole (PRILOSEC) 40 MG capsule Take 1 capsule (40 mg total) by mouth daily before breakfast. Open capsule and swallow granules with liquid or applesauce 90 capsule 3   oxycodone-acetaminophen (LYNOX) 2.5-300 MG per tablet Take 1 tablet by mouth at bedtime as needed for pain.     Polyethyl Glycol-Propyl Glycol (SYSTANE) 0.4-0.3 % SOLN Place 1 drop into both eyes 2 (two) times daily.     potassium chloride SA (KLOR-CON M) 20 MEQ tablet TAKE 1 TABLET (20 MEQ) BY MOUTH DAILY PRN- EDEMA (Patient taking differently: Take 20 mEq by mouth daily as needed (with each dose of Lasix).) 90 tablet 3   traMADol (ULTRAM) 50 MG tablet Take 50 mg by mouth every morning.     ALPRAZolam (XANAX) 0.25 MG tablet Take 0.25 mg by mouth at bedtime as needed for anxiety or sleep.     No facility-administered medications prior to visit.    Allergies  Allergen Reactions   Aspirin Nausea And Vomiting and Other (See Comments)    Adult Aspirin: upset stomach / irritates ulcer    Macrobid [Nitrofurantoin] Other (See Comments)    Stomach pain    Review of Systems  Constitutional:  Negative for chills and fever.  HENT:  Positive for hearing loss. Negative for congestion, sinus pressure, sinus pain and sore throat.   Eyes:  Negative for pain and discharge.  Respiratory:  Negative for cough and shortness of breath.   Cardiovascular:  Negative for chest pain and palpitations.  Gastrointestinal:  Negative for  abdominal pain, constipation, diarrhea, nausea and vomiting.  Endocrine: Negative for polydipsia and polyuria.  Genitourinary:  Negative for dysuria and hematuria.  Musculoskeletal:  Positive for arthralgias and back pain. Negative for neck pain and neck stiffness.  Skin:   Negative for rash.  Neurological:  Negative for dizziness and weakness.  Psychiatric/Behavioral:  Negative for agitation and behavioral problems. The patient is nervous/anxious.        Objective:    Physical Exam Vitals reviewed.  Constitutional:      General: She is not in acute distress.    Appearance: She is not diaphoretic.  HENT:     Head: Normocephalic and atraumatic.     Right Ear: There is impacted cerumen.     Nose: Nose normal.     Mouth/Throat:     Mouth: Mucous membranes are moist.  Eyes:     General: No scleral icterus.    Extraocular Movements: Extraocular movements intact.     Pupils: Pupils are equal, round, and reactive to light.  Cardiovascular:     Rate and Rhythm: Normal rate and regular rhythm.     Pulses: Normal pulses.     Heart sounds: Normal heart sounds. No murmur heard. Pulmonary:     Breath sounds: Normal breath sounds. No wheezing or rales.  Musculoskeletal:        General: Tenderness (Over MCPs and ICPs over b/l UE) and deformity (Ulnar deviation of b/l hands) present.     Cervical back: Neck supple. No tenderness.     Right lower leg: Edema (Mild) present.     Left lower leg: Edema (Mild) present.  Skin:    General: Skin is warm.     Findings: No rash.  Neurological:     General: No focal deficit present.     Mental Status: She is alert and oriented to person, place, and time.     Cranial Nerves: No cranial nerve deficit.     Sensory: No sensory deficit.     Motor: No weakness.  Psychiatric:        Mood and Affect: Mood normal.        Behavior: Behavior normal.     BP 108/70 (BP Location: Left Arm, Patient Position: Sitting, Cuff Size: Normal)   Pulse 73   Ht '5\' 1"'$  (1.549 m)   Wt 165 lb 12.8 oz (75.2 kg)   SpO2 92%   BMI 31.33 kg/m  Wt Readings from Last 3 Encounters:  10/06/22 165 lb 12.8 oz (75.2 kg)  09/15/22 156 lb (70.8 kg)  07/19/22 164 lb 6.4 oz (74.6 kg)        Assessment & Plan:   Problem List Items Addressed  This Visit       Nervous and Auditory   Impacted cerumen of right ear    Right ear irrigation done today, patient tolerated procedure well Advised to use Debrox eardrops as needed Avoid using any sharp objects for cleaning purposes      Sensorineural hearing loss (SNHL) of both ears    Recently had audiology testing Planned to get hearing aid        Other   Anxiety - Primary    Takes Xanax 0.25 mg BID PRN, used to be prescribed by pain clinic After discussion, I have agreed to prescribe it for her. Has tried SSRI without benefit      Relevant Medications   ALPRAZolam (XANAX) 0.25 MG tablet     Meds ordered this encounter  Medications  ALPRAZolam (XANAX) 0.25 MG tablet    Sig: Take 1 tablet (0.25 mg total) by mouth 2 (two) times daily as needed for anxiety or sleep.    Dispense:  60 tablet    Refill:  2     Toua Stites Keith Rake, MD

## 2022-10-06 NOTE — Assessment & Plan Note (Addendum)
Takes Xanax 0.25 mg BID PRN, used to be prescribed by pain clinic After discussion, I have agreed to prescribe it for her. Has tried SSRI without benefit

## 2022-10-06 NOTE — Patient Instructions (Signed)
Please apply Debrox ear drops for ear eax.  Do not use any sharp objects for cleaning purposes.

## 2022-10-06 NOTE — Assessment & Plan Note (Signed)
Right ear irrigation done today, patient tolerated procedure well Advised to use Debrox eardrops as needed Avoid using any sharp objects for cleaning purposes

## 2022-10-06 NOTE — Assessment & Plan Note (Signed)
Recently had audiology testing Planned to get hearing aid

## 2022-10-07 ENCOUNTER — Telehealth: Payer: Self-pay | Admitting: Internal Medicine

## 2022-10-07 ENCOUNTER — Other Ambulatory Visit (HOSPITAL_COMMUNITY): Payer: Self-pay

## 2022-10-07 NOTE — Telephone Encounter (Signed)
Left message

## 2022-10-07 NOTE — Telephone Encounter (Signed)
Pt was seen yesterday and wants a referral to [place] for pain management. Can you please send referral?  Pt wants to be called when referral is sent

## 2022-10-08 ENCOUNTER — Other Ambulatory Visit: Payer: Self-pay

## 2022-10-08 ENCOUNTER — Other Ambulatory Visit (HOSPITAL_COMMUNITY): Payer: Self-pay

## 2022-10-08 MED ORDER — PROLIA 60 MG/ML ~~LOC~~ SOSY
PREFILLED_SYRINGE | SUBCUTANEOUS | 1 refills | Status: DC
  Filled 2022-10-08: qty 1, 180d supply, fill #0
  Filled ????-??-??: fill #0

## 2022-10-12 ENCOUNTER — Telehealth: Payer: Self-pay | Admitting: Internal Medicine

## 2022-10-12 NOTE — Telephone Encounter (Signed)
Pt wants referral to [facility] for the pain management/clinic?

## 2022-10-12 NOTE — Telephone Encounter (Signed)
Spoke to patient, they do not prescribe pain meds, patient does not want the referral

## 2022-10-13 DIAGNOSIS — M79642 Pain in left hand: Secondary | ICD-10-CM | POA: Diagnosis not present

## 2022-10-13 DIAGNOSIS — M79641 Pain in right hand: Secondary | ICD-10-CM | POA: Diagnosis not present

## 2022-10-13 DIAGNOSIS — M0609 Rheumatoid arthritis without rheumatoid factor, multiple sites: Secondary | ICD-10-CM | POA: Diagnosis not present

## 2022-10-13 DIAGNOSIS — M1991 Primary osteoarthritis, unspecified site: Secondary | ICD-10-CM | POA: Diagnosis not present

## 2022-10-13 DIAGNOSIS — Z79899 Other long term (current) drug therapy: Secondary | ICD-10-CM | POA: Diagnosis not present

## 2022-10-14 LAB — LAB REPORT - SCANNED: EGFR: 72

## 2022-10-18 ENCOUNTER — Other Ambulatory Visit: Payer: Self-pay

## 2022-10-21 DIAGNOSIS — G894 Chronic pain syndrome: Secondary | ICD-10-CM | POA: Diagnosis not present

## 2022-10-21 DIAGNOSIS — Z79899 Other long term (current) drug therapy: Secondary | ICD-10-CM | POA: Diagnosis not present

## 2022-10-21 DIAGNOSIS — R030 Elevated blood-pressure reading, without diagnosis of hypertension: Secondary | ICD-10-CM | POA: Diagnosis not present

## 2022-10-21 DIAGNOSIS — M069 Rheumatoid arthritis, unspecified: Secondary | ICD-10-CM | POA: Diagnosis not present

## 2022-11-04 DIAGNOSIS — M810 Age-related osteoporosis without current pathological fracture: Secondary | ICD-10-CM | POA: Diagnosis not present

## 2022-11-10 ENCOUNTER — Other Ambulatory Visit (HOSPITAL_COMMUNITY): Payer: Self-pay | Admitting: Internal Medicine

## 2022-11-10 DIAGNOSIS — Z1231 Encounter for screening mammogram for malignant neoplasm of breast: Secondary | ICD-10-CM

## 2022-11-22 ENCOUNTER — Encounter: Payer: 59 | Admitting: Internal Medicine

## 2022-11-23 ENCOUNTER — Encounter: Payer: Self-pay | Admitting: Internal Medicine

## 2022-11-23 ENCOUNTER — Ambulatory Visit (INDEPENDENT_AMBULATORY_CARE_PROVIDER_SITE_OTHER): Payer: 59 | Admitting: Internal Medicine

## 2022-11-23 VITALS — BP 135/71 | HR 70 | Ht 61.0 in | Wt 166.0 lb

## 2022-11-23 DIAGNOSIS — M810 Age-related osteoporosis without current pathological fracture: Secondary | ICD-10-CM | POA: Diagnosis not present

## 2022-11-23 DIAGNOSIS — I10 Essential (primary) hypertension: Secondary | ICD-10-CM

## 2022-11-23 DIAGNOSIS — E038 Other specified hypothyroidism: Secondary | ICD-10-CM | POA: Diagnosis not present

## 2022-11-23 DIAGNOSIS — M069 Rheumatoid arthritis, unspecified: Secondary | ICD-10-CM

## 2022-11-23 DIAGNOSIS — F419 Anxiety disorder, unspecified: Secondary | ICD-10-CM

## 2022-11-23 DIAGNOSIS — Z0001 Encounter for general adult medical examination with abnormal findings: Secondary | ICD-10-CM

## 2022-11-23 DIAGNOSIS — E876 Hypokalemia: Secondary | ICD-10-CM | POA: Diagnosis not present

## 2022-11-23 DIAGNOSIS — K219 Gastro-esophageal reflux disease without esophagitis: Secondary | ICD-10-CM

## 2022-11-23 MED ORDER — POTASSIUM CHLORIDE CRYS ER 20 MEQ PO TBCR: EXTENDED_RELEASE_TABLET | ORAL | 3 refills | Status: DC

## 2022-11-23 NOTE — Assessment & Plan Note (Signed)
Gets prolia from Endocrinology Continue calcium and Vit D supplements 

## 2022-11-23 NOTE — Assessment & Plan Note (Signed)
Well-controlled Takes Xanax 0.25 mg BID PRN, used to be prescribed by pain clinic After discussion, I had agreed to prescribe it for her. Has tried SSRI without benefit

## 2022-11-23 NOTE — Assessment & Plan Note (Signed)
Lab Results  Component Value Date   TSH 5.820 (H) 07/19/2022   TSH borderline low, no symptoms of overcorrection currently On Levothyroxine 88 mcg QD Repeat TSH and free T4 wnl at Dr Willeen Cass office according to the patient, will request records Followed by Dr. Talmage Nap

## 2022-11-23 NOTE — Assessment & Plan Note (Signed)
BP Readings from Last 1 Encounters:  11/23/22 135/71   Well-controlled with Amlodipine Counseled for compliance with the medications Advised DASH diet and moderate exercise/walking, at least 150 mins/week

## 2022-11-23 NOTE — Patient Instructions (Signed)
Please continue to take medications as prescribed. ? ?Please continue to follow low salt diet and ambulate as tolerated. ?

## 2022-11-23 NOTE — Assessment & Plan Note (Addendum)
Physical exam as documented. Fasting blood tests requested from Endocrinology office.

## 2022-11-23 NOTE — Progress Notes (Signed)
Established Patient Office Visit  Subjective:  Patient ID: Kathy [name], female    DOB: 06-23-1941  Age: 82 y.o. MRN: [id number]  CC:  Chief Complaint  Patient presents with   Annual Exam    HPI Kathy [name] is a 82 y.o. female with past medical history of HTN, RA, hypothyroidism, osteoporosis, HLD, anxiety and polyarthritis who presents for annual physical.  HTN: BP is well-controlled. Takes medications regularly. Patient denies headache, dizziness, chest pain, dyspnea or palpitations.  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  She does not have LAFB according to cardiology eval.  Hypothyroidism: Followed by Dr. Talmage Nap.  She states that her recent blood tests at her office was WNL.  She continues to take levothyroxine 88 mcg daily.  Denies any recent change in weight or appetite.  She also had DEXA scan recently, which was better.  She is on Prolia for it currently.  GAD: She takes Xanax twice daily as needed for anxiety.  She has tried SSRIs without much improvement. She used to get Xanax from [facility] neurology. She denies anhedonia, SI or HI currently.  She has history of RA and gets Humira.  She also has chronic joint pains, and takes tramadol as needed for chronic pain.  Followed by [facility] clinic for pain management.  Past Medical History:  Diagnosis Date   Abnormal findings on diagnostic imaging of heart and coronary circulation    Anemia    FROM BLEEDING ULCER   Anxiety    takes Alprazolam daily as needed   Arthritis    dx with RA 2017   Bariatric surgery status    Cataract    Diverticulosis    Gastro-esophageal reflux disease with esophagitis    Gastrojejunal ulcer with hemorrhage    Headache(784.0)    occasionally   History of blood transfusion    no abnormal reaction noted   History of bronchitis    last time many yrs ago   Hyperlipidemia    PT DENIES THIS DX -  ON NO MEDS AND NO ONE HAS TOLD HER   Hypertension     takes Amlodipine daily   Hypothyroidism    takes Synthroid daily   Insomnia    Joint pain    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   Localized edema    Nocturia    Numbness    occasionally left arm at night   Obesity    Osteoporosis    takes Fosamax weekly   Pain in joint involving pelvic region and thigh    Peripheral edema    takes Lasix daily as needed   PONV (postoperative nausea and vomiting)    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/2017   Rheumatoid arthritis, unspecified    Rotator cuff arthropathy, 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   Sleep apnea    Phreesia 08/22/2020   Stomach ulcer    Thyroid disease    [employer]reesia 08/22/2020   Unspecified injury of muscle(s) and tendon(s) of the rotator cuff of left shoulder, subsequent encounter    Unspecified osteoarthritis, unspecified site    Wears glasses    Wears partial dentures     Past Surgical History:  Procedure Laterality Date   ABDOMINAL HYSTERECTOMY     partial   cataract surgery Bilateral    CHOLECYSTECTOMY     COLONOSCOPY     ESOPHAGOGASTRODUODENOSCOPY (EGD) WITH PROPOFOL N/A 01/11/2022   Procedure: ESOPHAGOGASTRODUODENOSCOPY (EGD) WITH PROPOFOL;  Surgeon: Iva Boop, MD;  Location: [facility] ENDOSCOPY;  Service: Gastroenterology;  Laterality: N/A;   EYE SURGERY     CATARACTS BOTH   gastric bypass surgery     HOT HEMOSTASIS N/A 01/11/2022   Procedure: HOT HEMOSTASIS (ARGON PLASMA COAGULATION/BICAP);  Surgeon: Iva Boop, MD;  Location: [facility] ENDOSCOPY;  Service: Gastroenterology;  Laterality: N/A;   JOINT REPLACEMENT Bilateral    hip   REVERSE SHOULDER ARTHROPLASTY Right 05/11/2016   Procedure: REVERSE SHOULDER ARTHROPLASTY;  Surgeon: Teryl Lucy, MD;  Location: [facility] OR;  Service: Orthopedics;  Laterality: Right;   REVISION TOTAL HIP ARTHROPLASTY Left 10/03/2013   DR Turner Daniels   ROTATOR CUFF REPAIR     SCLEROTHERAPY  01/11/2022   Procedure: Susa Day;  Surgeon: Iva Boop, MD;  Location: [facility]  ENDOSCOPY;  Service: Gastroenterology;;   TOTAL HIP REVISION Left 10/03/2013   Procedure: TOTAL HIP REVISION- left;  Surgeon: Nestor Lewandowsky, MD;  Location: [facility] OR;  Service: Orthopedics;  Laterality: Left;   TOTAL KNEE ARTHROPLASTY Left 11/29/2017   Procedure: LEFT TOTAL KNEE ARTHROPLASTY;  Surgeon: Teryl Lucy, MD;  Location: [facility] OR;  Service: Orthopedics;  Laterality: Left;   TOTAL SHOULDER ARTHROPLASTY Left 11/09/2016   Procedure: TOTAL REVERSE SHOULDER ARTHROPLASTY;  Surgeon: Teryl Lucy, MD;  Location: [facility] OR;  Service: Orthopedics;  Laterality: Left;   TOTAL SHOULDER REPLACEMENT Left 10/2016   TOTAL THYROIDECTOMY     UPPER GASTROINTESTINAL ENDOSCOPY      Family History  Problem Relation Age of Onset   Diabetes Mother    Congestive Heart Failure Mother    Lung cancer Father    Thrombosis Sister    CAD Brother        CABG   Colon cancer Neg Hx    Esophageal cancer Neg Hx    Stomach cancer Neg Hx    Rectal cancer Neg Hx     Social History   Socioeconomic History   Marital status: Divorced    Spouse name: Not on file   Number of children: Not on file   Years of education: Not on file   Highest education level: Not on file  Occupational History   Occupation: disabled  Tobacco Use   Smoking status: Never    Passive exposure: Never   Smokeless tobacco: Never  Vaping Use   Vaping Use: Never used  Substance and Sexual Activity   Alcohol use: No   Drug use: No   Sexual activity: Not Currently    Birth control/protection: Surgical  Other Topics Concern   Not on file  Social History Narrative   Not on file   Social Determinants of Health   Financial Resource Strain: Low Risk  (09/14/2021)   Overall Financial Resource Strain (CARDIA)    Difficulty of Paying Living Expenses: Not hard at all  Food Insecurity: No Food Insecurity (09/14/2021)   Hunger Vital Sign    Worried About Running Out of Food in the Last Year: Never true    Ran Out of Food in the Last Year:  Never true  Transportation Needs: No Transportation Needs (09/14/2021)   PRAPARE - Administrator, Civil Service (Medical): No    Lack of Transportation (Non-Medical): No  Physical Activity: Sufficiently Active (09/14/2021)   Exercise Vital Sign    Days of Exercise per Week: 6 days    Minutes of Exercise per Session: 40 min  Stress: No Stress Concern Present (09/14/2021)   [employer] of Occupational Health - Occupational Stress Questionnaire    Feeling of  Stress : Not at all  Social Connections: Moderately Isolated (09/14/2021)   Social Connection and Isolation Panel [NHANES]    Frequency of Communication with Friends and Family: More than three times a week    Frequency of Social Gatherings with Friends and Family: More than three times a week    Attends Religious Services: More than 4 times per year    Active Member of [employer] or Organizations: No    Attends Banker Meetings: Never    Marital Status: Divorced  Catering manager Violence: Not At Risk (09/14/2021)   Humiliation, Afraid, Rape, and Kick questionnaire    Fear of Current or Ex-Partner: No    Emotionally Abused: No    Physically Abused: No    Sexually Abused: No    Outpatient Medications Prior to Visit  Medication Sig Dispense Refill   levothyroxine (SYNTHROID) 88 MCG tablet Take 88 mcg by mouth daily before breakfast.     acetaminophen (TYLENOL) 650 MG CR tablet Take 1,300 mg by mouth at bedtime.     Adalimumab (HUMIRA PEN) 40 MG/0.4ML PNKT Inject 40 mg into the skin every Saturday.     ALPRAZolam (XANAX) 0.25 MG tablet Take 1 tablet (0.25 mg total) by mouth 2 (two) times daily as needed for anxiety or sleep. 60 tablet 2   amLODipine (NORVASC) 5 MG tablet Take 1 tablet (5 mg total) by mouth daily. 90 tablet 3   Calcium Carbonate-Vitamin D (CALCIUM 600+D) 600-5 MG-MCG TABS Take 0.5-1 tablets by mouth See admin instructions. Take one tablet by mouth twice daily - with breakfast and supper; take 1/2  tablet with lunch     Cholecalciferol (VITAMIN D3) 50 MCG (2000 UT) TABS Take 2,000 Units by mouth every morning.     denosumab (PROLIA) 60 MG/ML SOSY injection inject 60mg  Subcutaneous every 6 months 180 days 1 mL 1   diclofenac Sodium (VOLTAREN) 1 % GEL APPLY (1) GRAM TO AFFECTED AREA DAILY. 100 g 0   ferrous sulfate 325 (65 FE) MG EC tablet Take 1 tablet (325 mg total) by mouth daily. (Patient not taking: Reported on 06/07/2022) 60 tablet 3   furosemide (LASIX) 20 MG tablet Take 1 tablet (20 mg total) by mouth 2 (two) times daily. 180 tablet 3   Multiple Vitamin (MULTIVITAMIN WITH MINERALS) TABS tablet Take 1 tablet by mouth every morning.     Omega-3 Fatty Acids (FISH OIL) 500 MG CAPS Take 500 mg by mouth every morning. (Patient not taking: Reported on 06/07/2022)     omeprazole (PRILOSEC) 40 MG capsule Take 1 capsule (40 mg total) by mouth daily before breakfast. Open capsule and swallow granules with liquid or applesauce 90 capsule 3   oxycodone-acetaminophen (LYNOX) 2.5-300 MG per tablet Take 1 tablet by mouth at bedtime as needed for pain.     Polyethyl Glycol-Propyl Glycol (SYSTANE) 0.4-0.3 % SOLN Place 1 drop into both eyes 2 (two) times daily.     traMADol (ULTRAM) 50 MG tablet Take 50 mg by mouth every morning.     levothyroxine (SYNTHROID) 75 MCG tablet TAKE ONE TABLET BY MOUTH ONCE DAILY. 90 tablet 0   potassium chloride SA (KLOR-CON M) 20 MEQ tablet TAKE 1 TABLET (20 MEQ) BY MOUTH DAILY PRN- EDEMA (Patient taking differently: Take 20 mEq by mouth daily as needed (with each dose of Lasix).) 90 tablet 3   No facility-administered medications prior to visit.    Allergies  Allergen Reactions   Aspirin Nausea And Vomiting and Other (See Comments)  Adult Aspirin: upset stomach / irritates ulcer    Macrobid [Nitrofurantoin] Other (See Comments)    Stomach pain    ROS Review of Systems  Constitutional:  Negative for chills and fever.  HENT:  Negative for congestion, sinus  pressure, sinus pain and sore throat.   Eyes:  Negative for pain and discharge.  Respiratory:  Negative for cough and shortness of breath.   Cardiovascular:  Negative for chest pain and palpitations.  Gastrointestinal:  Negative for abdominal pain, constipation, diarrhea, nausea and vomiting.  Endocrine: Negative for polydipsia and polyuria.  Genitourinary:  Negative for dysuria and hematuria.  Musculoskeletal:  Positive for arthralgias and back pain. Negative for neck pain and neck stiffness.  Skin:  Negative for rash.  Neurological:  Negative for dizziness and weakness.  Psychiatric/Behavioral:  Negative for agitation and behavioral problems.       Objective:    Physical Exam Vitals reviewed.  Constitutional:      General: She is not in acute distress.    Appearance: She is not diaphoretic.  HENT:     Head: Normocephalic and atraumatic.     Nose: Nose normal.     Mouth/Throat:     Mouth: Mucous membranes are moist.  Eyes:     General: No scleral icterus.    Extraocular Movements: Extraocular movements intact.     Pupils: Pupils are equal, round, and reactive to light.  Cardiovascular:     Rate and Rhythm: Normal rate and regular rhythm.     Pulses: Normal pulses.     Heart sounds: Normal heart sounds. No murmur heard. Pulmonary:     Breath sounds: Normal breath sounds. No wheezing or rales.  Abdominal:     Palpations: Abdomen is soft.     Tenderness: There is no abdominal tenderness.  Musculoskeletal:        General: Tenderness (Over MCPs and ICPs over b/l UE) and deformity (Ulnar deviation of b/l hands) present.     Cervical back: Neck supple. No tenderness.     Right lower leg: No edema.     Left lower leg: No edema.  Skin:    General: Skin is warm.     Findings: No rash.  Neurological:     General: No focal deficit present.     Mental Status: She is alert and oriented to person, place, and time.     Cranial Nerves: No cranial nerve deficit.     Sensory: No  sensory deficit.     Motor: No weakness.  Psychiatric:        Mood and Affect: Mood normal.        Behavior: Behavior normal.     BP 135/71 (BP Location: Left Arm, Patient Position: Sitting, Cuff Size: Normal)   Pulse 70   Ht 5\' 1"  (1.549 m)   Wt 166 lb (75.3 kg)   SpO2 96%   BMI 31.37 kg/m  Wt Readings from Last 3 Encounters:  11/23/22 166 lb (75.3 kg)  10/06/22 165 lb 12.8 oz (75.2 kg)  09/15/22 156 lb (70.8 kg)    Lab Results  Component Value Date   TSH 5.820 (H) 07/19/2022   Lab Results  Component Value Date   WBC 5.1 07/19/2022   HGB 10.2 (L) 07/19/2022   HCT 30.2 (L) 07/19/2022   MCV 97 07/19/2022   PLT 223 07/19/2022   Lab Results  Component Value Date   NA 140 07/19/2022   K 4.8 07/19/2022   CO2 23 07/19/2022  GLUCOSE 85 07/19/2022   BUN 14 07/19/2022   CREATININE 0.83 07/19/2022   BILITOT 0.9 01/10/2022   ALKPHOS 35 (L) 01/10/2022   AST 23 01/10/2022   ALT 14 01/10/2022   PROT 7.0 01/10/2022   ALBUMIN 2.7 (L) 01/10/2022   CALCIUM 8.9 07/19/2022   ANIONGAP 6 01/11/2022   EGFR 71 07/19/2022   Lab Results  Component Value Date   CHOL 134 03/16/2022   Lab Results  Component Value Date   HDL 57 03/16/2022   Lab Results  Component Value Date   LDLCALC 64 03/16/2022   Lab Results  Component Value Date   TRIG 59 03/16/2022   Lab Results  Component Value Date   CHOLHDL 2.4 03/16/2022   Lab Results  Component Value Date   HGBA1C 5.1 05/23/2017      Assessment & Plan:   Problem List Items Addressed This Visit       Cardiovascular and Mediastinum   Hypertension    BP Readings from Last 1 Encounters:  11/23/22 135/71  Well-controlled with Amlodipine Counseled for compliance with the medications Advised DASH diet and moderate exercise/walking, at least 150 mins/week        Digestive   GERD (gastroesophageal reflux disease)    On omeprazole again Recent upper GI bleeding due to gastrojejunal ulcer, repeat EGD reviewed Followed  by GI        Endocrine   Hypothyroid    Lab Results  Component Value Date   TSH 5.820 (H) 07/19/2022  TSH borderline low, no symptoms of overcorrection currently On Levothyroxine 88 mcg QD Repeat TSH and free T4 wnl at Dr Willeen Cass office according to the patient, will request records Followed by Dr. Talmage Nap      Relevant Medications   levothyroxine (SYNTHROID) 88 MCG tablet     Musculoskeletal and Integument   Osteoporosis    Gets prolia from Endocrinology Continue calcium and Vit D supplements      Rheumatoid arthritis    Follows up with Rheumatologist - Dr Nickola Major On Humira        Other   Anxiety (Chronic)    Well-controlled Takes Xanax 0.25 mg BID PRN, used to be prescribed by pain clinic After discussion, I had agreed to prescribe it for her. Has tried SSRI without benefit      Encounter for general adult medical examination with abnormal findings - Primary    Physical exam as documented. Fasting blood tests requested from Endocrinology office.      Hypokalemia   Relevant Medications   potassium chloride SA (KLOR-CON M) 20 MEQ tablet    Meds ordered this encounter  Medications   potassium chloride SA (KLOR-CON M) 20 MEQ tablet    Sig: TAKE 1 TABLET (20 MEQ) BY MOUTH DAILY PRN with Lasix (EDEMA)    Dispense:  90 tablet    Refill:  3    Follow-up: Return in about 4 months (around 03/25/2023) for HTN and GAD.    [name], MD

## 2022-11-23 NOTE — Assessment & Plan Note (Signed)
On omeprazole again Recent upper GI bleeding due to gastrojejunal ulcer, repeat EGD reviewed Followed by GI 

## 2022-11-23 NOTE — Assessment & Plan Note (Signed)
Follows up with Rheumatologist - Dr Hawkes On Humira 

## 2022-11-29 DIAGNOSIS — G894 Chronic pain syndrome: Secondary | ICD-10-CM | POA: Diagnosis not present

## 2022-11-29 DIAGNOSIS — R030 Elevated blood-pressure reading, without diagnosis of hypertension: Secondary | ICD-10-CM | POA: Diagnosis not present

## 2022-11-29 DIAGNOSIS — M069 Rheumatoid arthritis, unspecified: Secondary | ICD-10-CM | POA: Diagnosis not present

## 2022-11-29 DIAGNOSIS — Z79899 Other long term (current) drug therapy: Secondary | ICD-10-CM | POA: Diagnosis not present

## 2022-11-29 DIAGNOSIS — M25569 Pain in unspecified knee: Secondary | ICD-10-CM | POA: Diagnosis not present

## 2022-11-29 DIAGNOSIS — M25539 Pain in unspecified wrist: Secondary | ICD-10-CM | POA: Diagnosis not present

## 2022-11-30 DIAGNOSIS — H0100A Unspecified blepharitis right eye, upper and lower eyelids: Secondary | ICD-10-CM | POA: Diagnosis not present

## 2022-11-30 DIAGNOSIS — H04123 Dry eye syndrome of bilateral lacrimal glands: Secondary | ICD-10-CM | POA: Diagnosis not present

## 2022-11-30 DIAGNOSIS — Z961 Presence of intraocular lens: Secondary | ICD-10-CM | POA: Diagnosis not present

## 2022-12-02 DIAGNOSIS — Z79899 Other long term (current) drug therapy: Secondary | ICD-10-CM | POA: Diagnosis not present

## 2022-12-13 ENCOUNTER — Encounter: Payer: Self-pay | Admitting: Cardiology

## 2022-12-13 ENCOUNTER — Ambulatory Visit: Payer: 59 | Attending: Cardiology | Admitting: Cardiology

## 2022-12-13 VITALS — BP 120/60 | HR 62 | Ht 61.5 in | Wt 160.8 lb

## 2022-12-13 DIAGNOSIS — R002 Palpitations: Secondary | ICD-10-CM

## 2022-12-13 DIAGNOSIS — I10 Essential (primary) hypertension: Secondary | ICD-10-CM

## 2022-12-13 DIAGNOSIS — R600 Localized edema: Secondary | ICD-10-CM

## 2022-12-13 NOTE — Progress Notes (Signed)
Clinical Summary Kathy [name] is a 82 y.o.female seen today for follow up of the following medical problems.      1. Leg edema - echo 11/2016 LVEF 55-60%, no WMAs, grade I diastolic dysfunction, PASP 20, mild RV dilatation     03/2022 echo: LVEF 60-65%, no WMAs, indet diastolic fxn, normal RV function, normal IVC -overall controlled with lasix.    2. Palpitations - infrequent symptoms - 1-2 times per week, feeling of heart racing. Often comes when starting activity.  - TSH was low 03/2022, pcp lowered thyroid medicine.   - rare palpitations, typically with stress.    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 On my read I do not agree with left axis or bifacsicular block on computer read. RBBB there at least since 2015     4. HTN - she is compliant with meds   5. History of GI bleed - admit 12/2021 - on PPI without recurrence.    Past Medical History:  Diagnosis Date   Abnormal findings on diagnostic imaging of heart and coronary circulation    Anemia    FROM BLEEDING ULCER   Anxiety    takes Alprazolam daily as needed   Arthritis    dx with RA 2017   Bariatric surgery status    Cataract    Diverticulosis    Gastro-esophageal reflux disease with esophagitis    Gastrojejunal ulcer with hemorrhage    Headache(784.0)    occasionally   History of blood transfusion    no abnormal reaction noted   History of bronchitis    last time many yrs ago   Hyperlipidemia    PT DENIES THIS DX -  ON NO MEDS AND NO ONE HAS TOLD HER   Hypertension    takes Amlodipine daily   Hypothyroidism    takes Synthroid daily   Insomnia    Joint pain    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   Localized edema    Nocturia    Numbness    occasionally left arm at night   Obesity    Osteoporosis    takes Fosamax weekly   Pain in joint involving pelvic region and thigh    Peripheral edema    takes Lasix daily as needed   PONV  (postoperative nausea and vomiting)    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/2017   Rheumatoid arthritis, unspecified (HCC)    Rotator cuff arthropathy, 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   Sleep apnea    [employer] 08/22/2020   Stomach ulcer    Thyroid disease    [employer] 08/22/2020   Unspecified injury of muscle(s) and tendon(s) of the rotator cuff of left shoulder, subsequent encounter    Unspecified osteoarthritis, unspecified site    Wears glasses    Wears partial dentures      Allergies  Allergen Reactions   Aspirin Nausea And Vomiting and Other (See Comments)    Adult Aspirin: upset stomach / irritates ulcer    Macrobid [Nitrofurantoin] Other (See Comments)    Stomach pain     Current Outpatient Medications  Medication Sig Dispense Refill   acetaminophen (TYLENOL) 650 MG CR tablet Take 1,300 mg by mouth at bedtime.     Adalimumab (HUMIRA PEN) 40 MG/0.4ML PNKT Inject 40 mg into the skin every Saturday.     ALPRAZolam (XANAX) 0.25 MG tablet Take 1 tablet (0.25 mg total) by mouth 2 (two) times daily as needed  for anxiety or sleep. 60 tablet 2   amLODipine (NORVASC) 5 MG tablet Take 1 tablet (5 mg total) by mouth daily. 90 tablet 3   Calcium Carbonate-Vitamin D (CALCIUM 600+D) 600-5 MG-MCG TABS Take 0.5-1 tablets by mouth See admin instructions. Take one tablet by mouth twice daily - with breakfast and supper; take 1/2 tablet with lunch     Cholecalciferol (VITAMIN D3) 50 MCG (2000 UT) TABS Take 2,000 Units by mouth every morning.     denosumab (PROLIA) 60 MG/ML SOSY injection inject 60mg  Subcutaneous every 6 months 180 days 1 mL 1   diclofenac Sodium (VOLTAREN) 1 % GEL APPLY (1) GRAM TO AFFECTED AREA DAILY. 100 g 0   ferrous sulfate 325 (65 FE) MG EC tablet Take 1 tablet (325 mg total) by mouth daily. (Patient not taking: Reported on 06/07/2022) 60 tablet 3   furosemide (LASIX) 20 MG tablet Take 1 tablet (20 mg total) by mouth 2 (two) times daily. 180 tablet 3   levothyroxine  (SYNTHROID) 88 MCG tablet Take 88 mcg by mouth daily before breakfast.     Multiple Vitamin (MULTIVITAMIN WITH MINERALS) TABS tablet Take 1 tablet by mouth every morning.     Omega-3 Fatty Acids (FISH OIL) 500 MG CAPS Take 500 mg by mouth every morning. (Patient not taking: Reported on 06/07/2022)     omeprazole (PRILOSEC) 40 MG capsule Take 1 capsule (40 mg total) by mouth daily before breakfast. Open capsule and swallow granules with liquid or applesauce 90 capsule 3   oxycodone-acetaminophen (LYNOX) 2.5-300 MG per tablet Take 1 tablet by mouth at bedtime as needed for pain.     Polyethyl Glycol-Propyl Glycol (SYSTANE) 0.4-0.3 % SOLN Place 1 drop into both eyes 2 (two) times daily.     potassium chloride SA (KLOR-CON M) 20 MEQ tablet TAKE 1 TABLET (20 MEQ) BY MOUTH DAILY PRN with Lasix (EDEMA) 90 tablet 3   traMADol (ULTRAM) 50 MG tablet Take 50 mg by mouth every morning.     No current facility-administered medications for this visit.     Past Surgical History:  Procedure Laterality Date   ABDOMINAL HYSTERECTOMY     partial   cataract surgery Bilateral    CHOLECYSTECTOMY     COLONOSCOPY     ESOPHAGOGASTRODUODENOSCOPY (EGD) WITH PROPOFOL N/A 01/11/2022   Procedure: ESOPHAGOGASTRODUODENOSCOPY (EGD) WITH PROPOFOL;  Surgeon: Iva Boop, MD;  Location: [facility] ENDOSCOPY;  Service: Gastroenterology;  Laterality: N/A;   EYE SURGERY     CATARACTS BOTH   gastric bypass surgery     HOT HEMOSTASIS N/A 01/11/2022   Procedure: HOT HEMOSTASIS (ARGON PLASMA COAGULATION/BICAP);  Surgeon: Iva Boop, MD;  Location: [facility] ENDOSCOPY;  Service: Gastroenterology;  Laterality: N/A;   JOINT REPLACEMENT Bilateral    hip   REVERSE SHOULDER ARTHROPLASTY Right 05/11/2016   Procedure: REVERSE SHOULDER ARTHROPLASTY;  Surgeon: Teryl Lucy, MD;  Location: [facility] OR;  Service: Orthopedics;  Laterality: Right;   REVISION TOTAL HIP ARTHROPLASTY Left 10/03/2013   DR Turner Daniels   ROTATOR CUFF REPAIR     SCLEROTHERAPY   01/11/2022   Procedure: Susa Day;  Surgeon: Iva Boop, MD;  Location: [facility] ENDOSCOPY;  Service: Gastroenterology;;   TOTAL HIP REVISION Left 10/03/2013   Procedure: TOTAL HIP REVISION- left;  Surgeon: Nestor Lewandowsky, MD;  Location: [facility] OR;  Service: Orthopedics;  Laterality: Left;   TOTAL KNEE ARTHROPLASTY Left 11/29/2017   Procedure: LEFT TOTAL KNEE ARTHROPLASTY;  Surgeon: Teryl Lucy, MD;  Location: [facility] OR;  Service:  Orthopedics;  Laterality: Left;   TOTAL SHOULDER ARTHROPLASTY Left 11/09/2016   Procedure: TOTAL REVERSE SHOULDER ARTHROPLASTY;  Surgeon: Teryl Lucy, MD;  Location: [facility] OR;  Service: Orthopedics;  Laterality: Left;   TOTAL SHOULDER REPLACEMENT Left 10/2016   TOTAL THYROIDECTOMY     UPPER GASTROINTESTINAL ENDOSCOPY       Allergies  Allergen Reactions   Aspirin Nausea And Vomiting and Other (See Comments)    Adult Aspirin: upset stomach / irritates ulcer    Macrobid [Nitrofurantoin] Other (See Comments)    Stomach pain      Family History  Problem Relation Age of Onset   Diabetes Mother    Congestive Heart Failure Mother    Lung cancer Father    Thrombosis Sister    CAD Brother        CABG   Colon cancer Neg Hx    Esophageal cancer Neg Hx    Stomach cancer Neg Hx    Rectal cancer Neg Hx      Social History Kathy [name] reports that she has never smoked. She has never been exposed to tobacco smoke. She has never used smokeless tobacco. Kathy [name] reports no history of alcohol use.   Review of Systems CONSTITUTIONAL: No weight loss, fever, chills, weakness or fatigue.  HEENT: Eyes: No visual loss, blurred vision, double vision or yellow sclerae.No hearing loss, sneezing, congestion, runny nose or sore throat.  SKIN: No rash or itching.  CARDIOVASCULAR: per hpi RESPIRATORY: No shortness of breath, cough or sputum.  GASTROINTESTINAL: No anorexia, nausea, vomiting or diarrhea. No abdominal pain or blood.  GENITOURINARY: No burning on urination, no  polyuria NEUROLOGICAL: No headache, dizziness, syncope, paralysis, ataxia, numbness or tingling in the extremities. No change in bowel or bladder control.  MUSCULOSKELETAL: No muscle, back pain, joint pain or stiffness.  LYMPHATICS: No enlarged nodes. No history of splenectomy.  PSYCHIATRIC: No history of depression or anxiety.  ENDOCRINOLOGIC: No reports of sweating, cold or heat intolerance. No polyuria or polydipsia.  Marland Kitchen   Physical Examination Today's Vitals   12/13/22 1256  BP: 120/60  Pulse: 62  SpO2: 95%  Weight: 160 lb 12.8 oz (72.9 kg)  Height: 5' 1.5" (1.562 m)   Body mass index is 29.89 kg/m.  Gen: resting comfortably, no acute distress HEENT: no scleral icterus, pupils equal round and reactive, no palptable cervical adenopathy,  CV: RRR, no mrg, no jvd Resp: Clear to auscultation bilaterally GI: abdomen is soft, non-tender, non-distended, normal bowel sounds, no hepatosplenomegaly MSK: extremities are warm, no edema.  Skin: warm, no rash Neuro:  no focal deficits Psych: appropriate affect   Diagnostic Studies  11/2016 echo Study Conclusions   - Left ventricle: The cavity size was normal. Wall thickness was   normal. Systolic function was normal. The estimated ejection   fraction was in the range of 55% to 60%. Wall motion was normal;   there were no regional wall motion abnormalities. Doppler   parameters are consistent with abnormal left ventricular   relaxation (grade 1 diastolic dysfunction). - Aortic valve: Mildly calcified annulus. Trileaflet. - Mitral valve: There was trivial regurgitation. - Right ventricle: The cavity size was mildly dilated. - Right atrium: The atrium was at the upper limits of normal in   size. - Atrial septum: No defect or patent foramen ovale was identified. - Tricuspid valve: There was trivial regurgitation. - Pulmonary arteries: PA peak pressure: 20 mm Hg (S). - Pericardium, extracardiac: A small pericardial effusion was    identified  circumferential to the heart.   Impressions:   - Normal LV wall thickness with LVEF 55-60% and grade 1 diastolic   dysfunction. Trivial mitral regurgitation. Mildly calcified   aortic annulus. Mild dilated right ventricle and upper normal   right atrial chamber size. Trivial tricuspid regurgitation with   PASP estimated 20 mmHg. Small circumferential pericardial   effusion.     03/2022 echo 1. Left ventricular ejection fraction, by estimation, is 60 to 65%. The  left ventricle has normal function. The left ventricle has no regional  wall motion abnormalities. Left ventricular diastolic parameters are  indeterminate.   2. Right ventricular systolic function is normal. The right ventricular  size is normal. There is mildly elevated pulmonary artery systolic  pressure.   3. Left atrial size was mild to moderately dilated.   4. Right atrial size was mildly dilated.   5. The mitral valve is normal in structure. Mild mitral valve  regurgitation. No evidence of mitral stenosis.   6. The tricuspid valve is abnormal. Tricuspid valve regurgitation is mild  to moderate.   7. The aortic valve is tricuspid. Aortic valve regurgitation is not  visualized. No aortic stenosis is present.   8. The inferior vena cava is normal in size with greater than 50%  respiratory variability, suggesting right atrial pressure of 3 mmHg.    Assessment and Plan   1. LE edema - echo with mild LV diastolic dysfunction, may be playing some role. Likely some component of venous insufficiency as well -overeall controlled with lasix, continue current therapy.    2. HTN - at goal, continue current meds  3. Palpitations - mild and infrequent, continue to monitor     Antoine Poche, M.D

## 2022-12-13 NOTE — Patient Instructions (Signed)
Medication Instructions:  Continue all current medications.   Labwork: none  Testing/Procedures: none  Follow-Up: Your physician wants you to follow up in:  1 year.  You should receive a recall letter in the mail about 2 months prior to the time you are due.  If you don't receive this, please call our office to schedule your follow up appointment.      Any Other Special Instructions Will Be Listed Below (If Applicable).   If you need a refill on your cardiac medications before your next appointment, please call your pharmacy.  

## 2022-12-22 ENCOUNTER — Ambulatory Visit (HOSPITAL_COMMUNITY)
Admission: RE | Admit: 2022-12-22 | Discharge: 2022-12-22 | Disposition: A | Discharge status: Home / Self Care | Payer: 59 | Source: Ambulatory Visit | Attending: Internal Medicine | Admitting: Internal Medicine

## 2022-12-22 ENCOUNTER — Encounter (HOSPITAL_COMMUNITY): Payer: Self-pay

## 2022-12-22 DIAGNOSIS — Z1231 Encounter for screening mammogram for malignant neoplasm of breast: Secondary | ICD-10-CM | POA: Insufficient documentation

## 2022-12-27 DIAGNOSIS — R030 Elevated blood-pressure reading, without diagnosis of hypertension: Secondary | ICD-10-CM | POA: Diagnosis not present

## 2022-12-27 DIAGNOSIS — Z79899 Other long term (current) drug therapy: Secondary | ICD-10-CM | POA: Diagnosis not present

## 2022-12-27 DIAGNOSIS — G894 Chronic pain syndrome: Secondary | ICD-10-CM | POA: Diagnosis not present

## 2022-12-27 DIAGNOSIS — M069 Rheumatoid arthritis, unspecified: Secondary | ICD-10-CM | POA: Diagnosis not present

## 2022-12-27 DIAGNOSIS — M25569 Pain in unspecified knee: Secondary | ICD-10-CM | POA: Diagnosis not present

## 2022-12-27 DIAGNOSIS — M25539 Pain in unspecified wrist: Secondary | ICD-10-CM | POA: Diagnosis not present

## 2022-12-30 DIAGNOSIS — Z79899 Other long term (current) drug therapy: Secondary | ICD-10-CM | POA: Diagnosis not present

## 2023-01-13 DIAGNOSIS — M1991 Primary osteoarthritis, unspecified site: Secondary | ICD-10-CM | POA: Diagnosis not present

## 2023-01-13 DIAGNOSIS — Z79899 Other long term (current) drug therapy: Secondary | ICD-10-CM | POA: Diagnosis not present

## 2023-01-13 DIAGNOSIS — M0609 Rheumatoid arthritis without rheumatoid factor, multiple sites: Secondary | ICD-10-CM | POA: Diagnosis not present

## 2023-01-19 ENCOUNTER — Other Ambulatory Visit (HOSPITAL_COMMUNITY): Payer: Self-pay

## 2023-01-21 ENCOUNTER — Other Ambulatory Visit (HOSPITAL_COMMUNITY): Payer: Self-pay

## 2023-01-23 ENCOUNTER — Other Ambulatory Visit: Payer: Self-pay

## 2023-01-23 ENCOUNTER — Inpatient Hospital Stay (HOSPITAL_COMMUNITY)
Admission: EM | Admit: 2023-01-23 | Discharge: 2023-01-31 | DRG: 641 | Disposition: A | Discharge status: Skilled Nursing Facility | Payer: 59 | Source: Other Acute Inpatient Hospital | Attending: Internal Medicine | Admitting: Internal Medicine

## 2023-01-23 ENCOUNTER — Encounter (HOSPITAL_COMMUNITY): Payer: Self-pay | Admitting: Emergency Medicine

## 2023-01-23 ENCOUNTER — Emergency Department (HOSPITAL_COMMUNITY): Payer: 59

## 2023-01-23 DIAGNOSIS — I517 Cardiomegaly: Secondary | ICD-10-CM | POA: Diagnosis not present

## 2023-01-23 DIAGNOSIS — E876 Hypokalemia: Secondary | ICD-10-CM | POA: Diagnosis present

## 2023-01-23 DIAGNOSIS — R9431 Abnormal electrocardiogram [ECG] [EKG]: Secondary | ICD-10-CM | POA: Diagnosis present

## 2023-01-23 DIAGNOSIS — R531 Weakness: Secondary | ICD-10-CM | POA: Diagnosis not present

## 2023-01-23 DIAGNOSIS — Z96612 Presence of left artificial shoulder joint: Secondary | ICD-10-CM | POA: Diagnosis present

## 2023-01-23 DIAGNOSIS — W19XXXA Unspecified fall, initial encounter: Secondary | ICD-10-CM | POA: Diagnosis not present

## 2023-01-23 DIAGNOSIS — E890 Postprocedural hypothyroidism: Secondary | ICD-10-CM | POA: Diagnosis present

## 2023-01-23 DIAGNOSIS — Z9841 Cataract extraction status, right eye: Secondary | ICD-10-CM

## 2023-01-23 DIAGNOSIS — R519 Headache, unspecified: Secondary | ICD-10-CM | POA: Diagnosis not present

## 2023-01-23 DIAGNOSIS — Z833 Family history of diabetes mellitus: Secondary | ICD-10-CM

## 2023-01-23 DIAGNOSIS — Z7983 Long term (current) use of bisphosphonates: Secondary | ICD-10-CM | POA: Diagnosis not present

## 2023-01-23 DIAGNOSIS — Z96643 Presence of artificial hip joint, bilateral: Secondary | ICD-10-CM | POA: Diagnosis present

## 2023-01-23 DIAGNOSIS — R2689 Other abnormalities of gait and mobility: Secondary | ICD-10-CM | POA: Diagnosis not present

## 2023-01-23 DIAGNOSIS — R600 Localized edema: Secondary | ICD-10-CM | POA: Diagnosis present

## 2023-01-23 DIAGNOSIS — G4730 Sleep apnea, unspecified: Secondary | ICD-10-CM | POA: Diagnosis present

## 2023-01-23 DIAGNOSIS — Z90711 Acquired absence of uterus with remaining cervical stump: Secondary | ICD-10-CM

## 2023-01-23 DIAGNOSIS — Z8249 Family history of ischemic heart disease and other diseases of the circulatory system: Secondary | ICD-10-CM

## 2023-01-23 DIAGNOSIS — Z79890 Hormone replacement therapy (postmenopausal): Secondary | ICD-10-CM

## 2023-01-23 DIAGNOSIS — M1990 Unspecified osteoarthritis, unspecified site: Secondary | ICD-10-CM | POA: Diagnosis present

## 2023-01-23 DIAGNOSIS — Z886 Allergy status to analgesic agent status: Secondary | ICD-10-CM | POA: Diagnosis not present

## 2023-01-23 DIAGNOSIS — C73 Malignant neoplasm of thyroid gland: Secondary | ICD-10-CM | POA: Diagnosis present

## 2023-01-23 DIAGNOSIS — Z96652 Presence of left artificial knee joint: Secondary | ICD-10-CM | POA: Diagnosis present

## 2023-01-23 DIAGNOSIS — Z751 Person awaiting admission to adequate facility elsewhere: Secondary | ICD-10-CM

## 2023-01-23 DIAGNOSIS — Z9049 Acquired absence of other specified parts of digestive tract: Secondary | ICD-10-CM

## 2023-01-23 DIAGNOSIS — M810 Age-related osteoporosis without current pathological fracture: Secondary | ICD-10-CM | POA: Diagnosis present

## 2023-01-23 DIAGNOSIS — Z888 Allergy status to other drugs, medicaments and biological substances status: Secondary | ICD-10-CM | POA: Diagnosis not present

## 2023-01-23 DIAGNOSIS — R0989 Other specified symptoms and signs involving the circulatory and respiratory systems: Secondary | ICD-10-CM | POA: Diagnosis not present

## 2023-01-23 DIAGNOSIS — E039 Hypothyroidism, unspecified: Secondary | ICD-10-CM | POA: Diagnosis present

## 2023-01-23 DIAGNOSIS — M62521 Muscle wasting and atrophy, not elsewhere classified, right upper arm: Secondary | ICD-10-CM | POA: Diagnosis not present

## 2023-01-23 DIAGNOSIS — D630 Anemia in neoplastic disease: Secondary | ICD-10-CM | POA: Diagnosis not present

## 2023-01-23 DIAGNOSIS — Z743 Need for continuous supervision: Secondary | ICD-10-CM | POA: Diagnosis not present

## 2023-01-23 DIAGNOSIS — I4510 Unspecified right bundle-branch block: Secondary | ICD-10-CM | POA: Diagnosis present

## 2023-01-23 DIAGNOSIS — M069 Rheumatoid arthritis, unspecified: Secondary | ICD-10-CM | POA: Diagnosis present

## 2023-01-23 DIAGNOSIS — Z85850 Personal history of malignant neoplasm of thyroid: Secondary | ICD-10-CM | POA: Diagnosis not present

## 2023-01-23 DIAGNOSIS — F419 Anxiety disorder, unspecified: Secondary | ICD-10-CM | POA: Diagnosis not present

## 2023-01-23 DIAGNOSIS — E038 Other specified hypothyroidism: Secondary | ICD-10-CM | POA: Diagnosis not present

## 2023-01-23 DIAGNOSIS — I498 Other specified cardiac arrhythmias: Secondary | ICD-10-CM | POA: Diagnosis present

## 2023-01-23 DIAGNOSIS — Z1152 Encounter for screening for COVID-19: Secondary | ICD-10-CM

## 2023-01-23 DIAGNOSIS — Z801 Family history of malignant neoplasm of trachea, bronchus and lung: Secondary | ICD-10-CM

## 2023-01-23 DIAGNOSIS — Z79899 Other long term (current) drug therapy: Secondary | ICD-10-CM

## 2023-01-23 DIAGNOSIS — A419 Sepsis, unspecified organism: Secondary | ICD-10-CM | POA: Diagnosis not present

## 2023-01-23 DIAGNOSIS — M62522 Muscle wasting and atrophy, not elsewhere classified, left upper arm: Secondary | ICD-10-CM | POA: Diagnosis not present

## 2023-01-23 DIAGNOSIS — I10 Essential (primary) hypertension: Secondary | ICD-10-CM | POA: Diagnosis present

## 2023-01-23 DIAGNOSIS — Z9181 History of falling: Secondary | ICD-10-CM

## 2023-01-23 DIAGNOSIS — Z9842 Cataract extraction status, left eye: Secondary | ICD-10-CM

## 2023-01-23 DIAGNOSIS — E785 Hyperlipidemia, unspecified: Secondary | ICD-10-CM | POA: Diagnosis present

## 2023-01-23 DIAGNOSIS — Z8711 Personal history of peptic ulcer disease: Secondary | ICD-10-CM

## 2023-01-23 DIAGNOSIS — N179 Acute kidney failure, unspecified: Secondary | ICD-10-CM | POA: Diagnosis present

## 2023-01-23 DIAGNOSIS — M62561 Muscle wasting and atrophy, not elsewhere classified, right lower leg: Secondary | ICD-10-CM | POA: Diagnosis not present

## 2023-01-23 DIAGNOSIS — K2100 Gastro-esophageal reflux disease with esophagitis, without bleeding: Secondary | ICD-10-CM | POA: Diagnosis not present

## 2023-01-23 DIAGNOSIS — Z9884 Bariatric surgery status: Secondary | ICD-10-CM

## 2023-01-23 DIAGNOSIS — Z96611 Presence of right artificial shoulder joint: Secondary | ICD-10-CM | POA: Diagnosis present

## 2023-01-23 DIAGNOSIS — M6281 Muscle weakness (generalized): Secondary | ICD-10-CM | POA: Diagnosis not present

## 2023-01-23 DIAGNOSIS — M62562 Muscle wasting and atrophy, not elsewhere classified, left lower leg: Secondary | ICD-10-CM | POA: Diagnosis not present

## 2023-01-23 DIAGNOSIS — R5383 Other fatigue: Secondary | ICD-10-CM | POA: Diagnosis present

## 2023-01-23 LAB — URINALYSIS, W/ REFLEX TO CULTURE (INFECTION SUSPECTED)
Bacteria, UA: NONE SEEN
Bilirubin Urine: NEGATIVE
Glucose, UA: NEGATIVE mg/dL
Hgb urine dipstick: NEGATIVE
Ketones, ur: NEGATIVE mg/dL
Leukocytes,Ua: NEGATIVE
Nitrite: NEGATIVE
Protein, ur: 30 mg/dL — AB
Specific Gravity, Urine: 1.009 (ref 1.005–1.030)
pH: 7 (ref 5.0–8.0)

## 2023-01-23 LAB — COMPREHENSIVE METABOLIC PANEL
ALT: 53 U/L — ABNORMAL HIGH (ref 0–44)
AST: 104 U/L — ABNORMAL HIGH (ref 15–41)
Albumin: 2.3 g/dL — ABNORMAL LOW (ref 3.5–5.0)
Alkaline Phosphatase: 52 U/L (ref 38–126)
Anion gap: 10 (ref 5–15)
BUN: 54 mg/dL — ABNORMAL HIGH (ref 8–23)
CO2: 29 mmol/L (ref 22–32)
Calcium: >15 mg/dL (ref 8.9–10.3)
Chloride: 97 mmol/L — ABNORMAL LOW (ref 98–111)
Creatinine, Ser: 2.52 mg/dL — ABNORMAL HIGH (ref 0.44–1.00)
GFR, Estimated: 19 mL/min — ABNORMAL LOW (ref >60)
Glucose, Bld: 106 mg/dL — ABNORMAL HIGH (ref 70–99)
Potassium: 3.5 mmol/L (ref 3.5–5.1)
Sodium: 136 mmol/L (ref 135–145)
Total Bilirubin: 0.6 mg/dL (ref 0.3–1.2)
Total Protein: 9.6 g/dL — ABNORMAL HIGH (ref 6.5–8.1)

## 2023-01-23 LAB — CBC WITH DIFFERENTIAL/PLATELET
Abs Immature Granulocytes: 0.04 10*3/uL (ref 0.00–0.07)
Basophils Absolute: 0 10*3/uL (ref 0.0–0.1)
Basophils Relative: 0 %
Eosinophils Absolute: 0 10*3/uL (ref 0.0–0.5)
Eosinophils Relative: 0 %
HCT: 30.3 % — ABNORMAL LOW (ref 36.0–46.0)
Hemoglobin: 10.1 g/dL — ABNORMAL LOW (ref 12.0–15.0)
Immature Granulocytes: 1 %
Lymphocytes Relative: 19 %
Lymphs Abs: 1.5 10*3/uL (ref 0.7–4.0)
MCH: 33.1 pg (ref 26.0–34.0)
MCHC: 33.3 g/dL (ref 30.0–36.0)
MCV: 99.3 fL (ref 80.0–100.0)
Monocytes Absolute: 0.7 10*3/uL (ref 0.1–1.0)
Monocytes Relative: 9 %
Neutro Abs: 5.7 10*3/uL (ref 1.7–7.7)
Neutrophils Relative %: 71 %
Platelets: 211 10*3/uL (ref 150–400)
RBC: 3.05 MIL/uL — ABNORMAL LOW (ref 3.87–5.11)
RDW: 14.7 % (ref 11.5–15.5)
WBC: 8 10*3/uL (ref 4.0–10.5)
nRBC: 0.4 % — ABNORMAL HIGH (ref 0.0–0.2)

## 2023-01-23 LAB — I-STAT CHEM 8, ED
BUN: 57 mg/dL — ABNORMAL HIGH (ref 8–23)
Calcium, Ion: 2.24 mmol/L (ref 1.15–1.40)
Chloride: 102 mmol/L (ref 98–111)
Creatinine, Ser: 2.9 mg/dL — ABNORMAL HIGH (ref 0.44–1.00)
Glucose, Bld: 81 mg/dL (ref 70–99)
HCT: 33 % — ABNORMAL LOW (ref 36.0–46.0)
Hemoglobin: 11.2 g/dL — ABNORMAL LOW (ref 12.0–15.0)
Potassium: 3.5 mmol/L (ref 3.5–5.1)
Sodium: 140 mmol/L (ref 135–145)
TCO2: 36 mmol/L — ABNORMAL HIGH (ref 22–32)

## 2023-01-23 LAB — LACTIC ACID, PLASMA
Lactic Acid, Venous: 1.2 mmol/L (ref 0.5–1.9)
Lactic Acid, Venous: 1.1 mmol/L (ref 0.5–1.9)

## 2023-01-23 LAB — APTT: aPTT: <20 seconds — ABNORMAL LOW (ref 24–36)

## 2023-01-23 LAB — BRAIN NATRIURETIC PEPTIDE: B Natriuretic Peptide: 848 pg/mL — ABNORMAL HIGH (ref 0.0–100.0)

## 2023-01-23 LAB — LIPASE, BLOOD: Lipase: 167 U/L — ABNORMAL HIGH (ref 11–51)

## 2023-01-23 LAB — PROTIME-INR
INR: 1.1 (ref 0.8–1.2)
Prothrombin Time: 14.5 seconds (ref 11.4–15.2)

## 2023-01-23 LAB — CULTURE, BLOOD (ROUTINE X 2)

## 2023-01-23 LAB — MAGNESIUM: Magnesium: 1.5 mg/dL — ABNORMAL LOW (ref 1.7–2.4)

## 2023-01-23 LAB — SARS CORONAVIRUS 2 BY RT PCR: SARS Coronavirus 2 by RT PCR: NEGATIVE

## 2023-01-23 LAB — TROPONIN I (HIGH SENSITIVITY)
Troponin I (High Sensitivity): 60 ng/L — ABNORMAL HIGH (ref <18)
Troponin I (High Sensitivity): 48 ng/L — ABNORMAL HIGH (ref <18)

## 2023-01-23 MED ORDER — ACETAMINOPHEN 650 MG RE SUPP: 650.0000 mg | Freq: Four times a day (QID) | RECTAL | Status: DC | PRN

## 2023-01-23 MED ORDER — ACETAMINOPHEN 325 MG PO TABS
650.0000 mg | ORAL_TABLET | Freq: Four times a day (QID) | ORAL | Status: DC | PRN
  Administered 2023-01-24 – 2023-01-26 (×2): 650 mg via ORAL
  Filled 2023-01-23 (×2): qty 2

## 2023-01-23 MED ORDER — LEVOTHYROXINE SODIUM 88 MCG PO TABS
88.0000 ug | ORAL_TABLET | Freq: Every day | ORAL | Status: DC
  Administered 2023-01-24 – 2023-01-31 (×8): 88 ug via ORAL
  Filled 2023-01-23 (×8): qty 1

## 2023-01-23 MED ORDER — LACTATED RINGERS IV BOLUS
1000.0000 mL | Freq: Once | INTRAVENOUS | Status: AC
  Administered 2023-01-23: 1000 mL via INTRAVENOUS

## 2023-01-23 MED ORDER — LACTATED RINGERS IV BOLUS (SEPSIS)
500.0000 mL | Freq: Once | INTRAVENOUS | Status: AC
  Administered 2023-01-23: 500 mL via INTRAVENOUS

## 2023-01-23 MED ORDER — POTASSIUM CHLORIDE CRYS ER 20 MEQ PO TBCR
40.0000 meq | EXTENDED_RELEASE_TABLET | Freq: Once | ORAL | Status: AC
  Administered 2023-01-23: 40 meq via ORAL
  Filled 2023-01-23: qty 2

## 2023-01-23 MED ORDER — ONDANSETRON HCL 4 MG PO TABS: 4.0000 mg | ORAL_TABLET | Freq: Four times a day (QID) | ORAL | Status: DC | PRN

## 2023-01-23 MED ORDER — CALCITONIN (SALMON) 200 UNIT/ML IJ SOLN
4.0000 [IU]/kg | Freq: Two times a day (BID) | INTRAMUSCULAR | Status: AC
  Administered 2023-01-23 – 2023-01-25 (×4): 280 [IU] via SUBCUTANEOUS
  Filled 2023-01-23: qty 2
  Filled 2023-01-23 (×4): qty 1.4

## 2023-01-23 MED ORDER — HEPARIN SODIUM (PORCINE) 5000 UNIT/ML IJ SOLN
5000.0000 [IU] | Freq: Three times a day (TID) | INTRAMUSCULAR | Status: DC
  Administered 2023-01-23 – 2023-01-26 (×9): 5000 [IU] via SUBCUTANEOUS
  Filled 2023-01-23 (×9): qty 1

## 2023-01-23 MED ORDER — SODIUM CHLORIDE 0.9 % IV SOLN: INTRAVENOUS | Status: DC

## 2023-01-23 MED ORDER — ONDANSETRON HCL 4 MG/2ML IJ SOLN: 4.0000 mg | Freq: Four times a day (QID) | INTRAMUSCULAR | Status: DC | PRN

## 2023-01-23 MED ORDER — POLYETHYLENE GLYCOL 3350 17 G PO PACK
17.0000 g | PACK | Freq: Every day | ORAL | Status: DC | PRN
  Administered 2023-01-27: 17 g via ORAL
  Filled 2023-01-23: qty 1

## 2023-01-23 MED ORDER — ZOLEDRONIC ACID 4 MG/100ML IV SOLN
4.0000 mg | Freq: Once | INTRAVENOUS | Status: AC
  Administered 2023-01-23: 4 mg via INTRAVENOUS
  Filled 2023-01-23: qty 100

## 2023-01-23 MED ORDER — PANTOPRAZOLE SODIUM 40 MG PO TBEC
40.0000 mg | DELAYED_RELEASE_TABLET | Freq: Every day | ORAL | Status: DC
  Administered 2023-01-24 – 2023-01-25 (×2): 40 mg via ORAL
  Filled 2023-01-23 (×2): qty 1

## 2023-01-23 NOTE — Assessment & Plan Note (Addendum)
Stable.  On Humira. 

## 2023-01-23 NOTE — ED Triage Notes (Signed)
Pt arrived by EMS from urgent care. Pt states she has felt weakness x1 week. Pt has hx of anemia.

## 2023-01-23 NOTE — ED Notes (Signed)
ED TO INPATIENT HANDOFF REPORT  ED Nurse Name and Phone #: Wandra Mannan, Paramedic [phone]  S Name/Age/Gender Kathy [name] 82 y.o. female Room/Bed: APA10/APA10  Code Status   Code Status: Prior  Home/SNF/Other Home Patient oriented to: self, place, time, and situation Is this baseline? Yes   Triage Complete: Triage complete  Chief Complaint Hypercalcemia [E83.52]  Triage Note Pt arrived by EMS from urgent care. Pt states she has felt weakness x1 week. Pt has hx of anemia.    Allergies Allergies  Allergen Reactions   Aspirin Nausea And Vomiting and Other (See Comments)    Adult Aspirin: upset stomach / irritates ulcer  Other Reaction(s): GI Intolerance  Adult Aspirin: upset stomach / irritates ulcer , Adult Aspirin: upset stomach / irritates ulcer   Macrobid [Nitrofurantoin] Other (See Comments)    Stomach pain    Level of Care/Admitting Diagnosis ED Disposition     ED Disposition  Admit   Condition  --   Comment  Hospital Area: [facility] [[id number]]  Level of Care: Telemetry [5]  Covid Evaluation: Asymptomatic - no recent exposure (last 10 days) testing not required  Diagnosis: Hypercalcemia [275.42.ICD-9-CM]  Admitting Physician: Onnie Boer [[id number]]  Attending Physician: Onnie Boer [phone]  Certification:: I certify this patient will need inpatient services for at least 2 midnights  Estimated Length of Stay: 2          B Medical/Surgery History Past Medical History:  Diagnosis Date   Abnormal findings on diagnostic imaging of heart and coronary circulation    Anemia    FROM BLEEDING ULCER   Anxiety    takes Alprazolam daily as needed   Arthritis    dx with RA 2017   Bariatric surgery status    Cataract    Diverticulosis    Gastro-esophageal reflux disease with esophagitis    Gastrojejunal ulcer with hemorrhage    Headache(784.0)    occasionally   History of blood transfusion    no abnormal reaction  noted   History of bronchitis    last time many yrs ago   Hyperlipidemia    PT DENIES THIS DX -  ON NO MEDS AND NO ONE HAS TOLD HER   Hypertension    takes Amlodipine daily   Hypothyroidism    takes Synthroid daily   Insomnia    Joint pain    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   Localized edema    Nocturia    Numbness    occasionally left arm at night   Obesity    Osteoporosis    takes Fosamax weekly   Pain in joint involving pelvic region and thigh    Peripheral edema    takes Lasix daily as needed   PONV (postoperative nausea and vomiting)    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/2017   Rheumatoid arthritis, unspecified (HCC)    Rotator cuff arthropathy, 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   Sleep apnea    Phreesia 08/22/2020   Stomach ulcer    Thyroid disease    Phreesia 08/22/2020   Unspecified injury of muscle(s) and tendon(s) of the rotator cuff of left shoulder, subsequent encounter    Unspecified osteoarthritis, unspecified site    Wears glasses    Wears partial dentures    Past Surgical History:  Procedure Laterality Date   ABDOMINAL HYSTERECTOMY     partial   cataract surgery Bilateral    CHOLECYSTECTOMY     COLONOSCOPY     ESOPHAGOGASTRODUODENOSCOPY (EGD)  WITH PROPOFOL N/A 01/11/2022   Procedure: ESOPHAGOGASTRODUODENOSCOPY (EGD) WITH PROPOFOL;  Surgeon: Iva Boop, MD;  Location: [facility] ENDOSCOPY;  Service: Gastroenterology;  Laterality: N/A;   EYE SURGERY     CATARACTS BOTH   gastric bypass surgery     HOT HEMOSTASIS N/A 01/11/2022   Procedure: HOT HEMOSTASIS (ARGON PLASMA COAGULATION/BICAP);  Surgeon: Iva Boop, MD;  Location: [facility] ENDOSCOPY;  Service: Gastroenterology;  Laterality: N/A;   JOINT REPLACEMENT Bilateral    hip   REVERSE SHOULDER ARTHROPLASTY Right 05/11/2016   Procedure: REVERSE SHOULDER ARTHROPLASTY;  Surgeon: Teryl Lucy, MD;  Location: [facility] OR;  Service: Orthopedics;  Laterality: Right;   REVISION TOTAL HIP ARTHROPLASTY  Left 10/03/2013   DR Turner Daniels   ROTATOR CUFF REPAIR     SCLEROTHERAPY  01/11/2022   Procedure: Susa Day;  Surgeon: Iva Boop, MD;  Location: [facility] ENDOSCOPY;  Service: Gastroenterology;;   TOTAL HIP REVISION Left 10/03/2013   Procedure: TOTAL HIP REVISION- left;  Surgeon: Nestor Lewandowsky, MD;  Location: [facility] OR;  Service: Orthopedics;  Laterality: Left;   TOTAL KNEE ARTHROPLASTY Left 11/29/2017   Procedure: LEFT TOTAL KNEE ARTHROPLASTY;  Surgeon: Teryl Lucy, MD;  Location: [facility] OR;  Service: Orthopedics;  Laterality: Left;   TOTAL SHOULDER ARTHROPLASTY Left 11/09/2016   Procedure: TOTAL REVERSE SHOULDER ARTHROPLASTY;  Surgeon: Teryl Lucy, MD;  Location: [facility] OR;  Service: Orthopedics;  Laterality: Left;   TOTAL SHOULDER REPLACEMENT Left 10/2016   TOTAL THYROIDECTOMY     UPPER GASTROINTESTINAL ENDOSCOPY       A IV Location/Drains/Wounds Patient Lines/Drains/Airways Status     Active Line/Drains/Airways     Name Placement date Placement time Site Days   Peripheral IV 01/23/23 20 G 1" Anterior;Proximal;Right Forearm 01/23/23  1358  Forearm  less than 1            Intake/Output Last 24 hours No intake or output data in the 24 hours ending 01/23/23 1550  Labs/Imaging Results for orders placed or performed during the hospital encounter of 01/23/23 (from the past 48 hour(s))  Comprehensive metabolic panel     Status: Abnormal   Collection Time: 01/23/23 12:07 PM  Result Value Ref Range   Sodium 136 135 - 145 mmol/L   Potassium 3.5 3.5 - 5.1 mmol/L   Chloride 97 (L) 98 - 111 mmol/L   CO2 29 22 - 32 mmol/L   Glucose, Bld 106 (H) 70 - 99 mg/dL    Comment: Glucose reference range applies only to samples taken after fasting for at least 8 hours.   BUN 54 (H) 8 - 23 mg/dL   Creatinine, Ser 8.11 (H) 0.44 - 1.00 mg/dL   Calcium >91.4 (HH) 8.9 - 10.3 mg/dL    Comment: CRITICAL RESULT CALLED TO, READ BACK BY AND VERIFIED WITH Toluwani Yadav, M AT 1342 ON 01/23/23 BY SMN. REPEATED TO  VERIFY    Total Protein 9.6 (H) 6.5 - 8.1 g/dL   Albumin 2.3 (L) 3.5 - 5.0 g/dL   AST 782 (H) 15 - 41 U/L   ALT 53 (H) 0 - 44 U/L   Alkaline Phosphatase 52 38 - 126 U/L   Total Bilirubin 0.6 0.3 - 1.2 mg/dL   GFR, Estimated 19 (L) >60 mL/min    Comment: (NOTE) Calculated using the CKD-EPI Creatinine Equation (2021)    Anion gap 10 5 - 15    Comment: Performed at [facility], [address], [place], [place] [address]  CBC with Differential     Status:  Abnormal   Collection Time: 01/23/23 12:07 PM  Result Value Ref Range   WBC 8.0 4.0 - 10.5 K/uL   RBC 3.05 (L) 3.87 - 5.11 MIL/uL   Hemoglobin 10.1 (L) 12.0 - 15.0 g/dL   HCT 29.5 (L) 62.1 - 30.8 %   MCV 99.3 80.0 - 100.0 fL   MCH 33.1 26.0 - 34.0 pg   MCHC 33.3 30.0 - 36.0 g/dL   RDW 65.7 84.6 - 96.2 %   Platelets 211 150 - 400 K/uL   nRBC 0.4 (H) 0.0 - 0.2 %   Neutrophils Relative % 71 %   Neutro Abs 5.7 1.7 - 7.7 K/uL   Lymphocytes Relative 19 %   Lymphs Abs 1.5 0.7 - 4.0 K/uL   Monocytes Relative 9 %   Monocytes Absolute 0.7 0.1 - 1.0 K/uL   Eosinophils Relative 0 %   Eosinophils Absolute 0.0 0.0 - 0.5 K/uL   Basophils Relative 0 %   Basophils Absolute 0.0 0.0 - 0.1 K/uL   Immature Granulocytes 1 %   Abs Immature Granulocytes 0.04 0.00 - 0.07 K/uL    Comment: Performed at [facility], [address], [place], [place] [address]  Lipase, blood     Status: Abnormal   Collection Time: 01/23/23 12:07 PM  Result Value Ref Range   Lipase 167 (H) 11 - 51 U/L    Comment: Performed at [facility], [address], [place], [place] [address]  Blood Culture (routine x 2)     Status: None (Preliminary result)   Collection Time: 01/23/23 12:33 PM   Specimen: Blood  Result Value Ref Range   Specimen Description      LEFT ANTECUBITAL BOTTLES DRAWN AEROBIC AND ANAEROBIC   Special Requests      Blood Culture adequate volume Performed at [facility], [address], [place], [place] [address]    Culture PENDING    Report Status  PENDING   Brain natriuretic peptide     Status: Abnormal   Collection Time: 01/23/23 12:34 PM  Result Value Ref Range   B Natriuretic Peptide 848.0 (H) 0.0 - 100.0 pg/mL    Comment: Performed at [facility], [address] [place], [place] [address]  Blood Culture (routine x 2)     Status: None (Preliminary result)   Collection Time: 01/23/23 12:38 PM   Specimen: Blood  Result Value Ref Range   Specimen Description      RIGHT ANTECUBITAL BOTTLES DRAWN AEROBIC AND ANAEROBIC   Special Requests      Blood Culture results may not be optimal due to an inadequate volume of blood received in culture bottles Performed at [facility], [address], [place], [place] [address]    Culture PENDING    Report Status PENDING   Lactic acid, plasma     Status: None   Collection Time: 01/23/23  1:21 PM  Result Value Ref Range   Lactic Acid, Venous 1.2 0.5 - 1.9 mmol/L    Comment: Performed at [facility], [address], [place], [place] [address]  Urinalysis, w/ Reflex to Culture (Infection Suspected) -Urine, Clean Catch     Status: Abnormal   Collection Time: 01/23/23  1:42 PM  Result Value Ref Range   Specimen Source URINE, CLEAN CATCH    Color, Urine YELLOW YELLOW   APPearance CLOUDY (A) CLEAR   Specific Gravity, Urine 1.009 1.005 - 1.030   pH 7.0 5.0 - 8.0   Glucose, UA NEGATIVE NEGATIVE mg/dL   Hgb  urine dipstick NEGATIVE NEGATIVE   Bilirubin Urine NEGATIVE NEGATIVE   Ketones, ur NEGATIVE NEGATIVE mg/dL   Protein, ur 30 (A) NEGATIVE mg/dL   Nitrite NEGATIVE NEGATIVE   Leukocytes,Ua NEGATIVE NEGATIVE   RBC / HPF 0-5 0 - 5 RBC/hpf   WBC, UA 0-5 0 - 5 WBC/hpf    Comment:        Reflex urine culture not performed if WBC <=10, OR if Squamous epithelial cells >5. If Squamous epithelial cells >5 suggest recollection.    Bacteria, UA NONE SEEN NONE SEEN   Squamous Epithelial / HPF 0-5 0 - 5 /HPF   Mucus PRESENT    Hyaline Casts, UA PRESENT    Amorphous Crystal PRESENT     Comment:  Performed at [facility], [address], [place], [place] [address]  Protime-INR     Status: None   Collection Time: 01/23/23  2:00 PM  Result Value Ref Range   Prothrombin Time 14.5 11.4 - 15.2 seconds   INR 1.1 0.8 - 1.2    Comment: (NOTE) INR goal varies based on device and disease states. Performed at [facility], [address] [place], [place] [address]   APTT     Status: Abnormal   Collection Time: 01/23/23  2:00 PM  Result Value Ref Range   aPTT <20 (L) 24 - 36 seconds    Comment: REPEATED TO VERIFY Performed at [facility], [address], [place], [place] [address]   I-stat chem 8, ED     Status: Abnormal   Collection Time: 01/23/23  2:03 PM  Result Value Ref Range   Sodium 140 135 - 145 mmol/L   Potassium 3.5 3.5 - 5.1 mmol/L   Chloride 102 98 - 111 mmol/L   BUN 57 (H) 8 - 23 mg/dL   Creatinine, Ser 9.14 (H) 0.44 - 1.00 mg/dL   Glucose, Bld 81 70 - 99 mg/dL    Comment: Glucose reference range applies only to samples taken after fasting for at least 8 hours.   Calcium, Ion 2.24 (HH) 1.15 - 1.40 mmol/L   TCO2 36 (H) 22 - 32 mmol/L   Hemoglobin 11.2 (L) 12.0 - 15.0 g/dL   HCT 78.2 (L) 95.6 - 21.3 %   Comment NOTIFIED PHYSICIAN   Lactic acid, plasma     Status: None   Collection Time: 01/23/23  3:01 PM  Result Value Ref Range   Lactic Acid, Venous 1.1 0.5 - 1.9 mmol/L    Comment: Performed at [facility], [address], [place], [place] [address]   DG Chest Port 1 View  Result Date: 01/23/2023 CLINICAL DATA:  Sepsis. EXAM: PORTABLE CHEST 1 VIEW COMPARISON:  September 26, 2013. FINDINGS: Mild cardiomegaly is noted with mild central pulmonary vascular congestion. No consolidative process is noted. Status post bilateral shoulder arthroplasties. IMPRESSION: Mild cardiomegaly with mild central pulmonary vascular congestion. Electronically Signed   By: Lupita Raider M.D.   On: 01/23/2023 12:57    Pending Labs Unresulted Labs (From admission, onward)     Start      Ordered   01/23/23 1234  SARS Coronavirus 2 by RT PCR (hospital order, performed in [facility] hospital lab) *cepheid single result test* Anterior Nasal Swab  (Tier 2 - SARS Coronavirus 2 by RT PCR (hospital order, performed in [facility] hospital lab) *cepheid single result test*)  Once,   URGENT        01/23/23 1233  Vitals/Pain Today's Vitals   01/23/23 1120 01/23/23 1121 01/23/23 1125  BP: 129/77    Pulse: 83    Temp:   98.5 F (36.9 C)  TempSrc:   Oral  SpO2: 95%    Weight:  154 lb (69.9 kg)   Height:  5\' 1"  (1.549 m)   PainSc:  0-No pain     Isolation Precautions No active isolations  Medications Medications  lactated ringers bolus 500 mL (500 mLs Intravenous New Bag/Given 01/23/23 1345)  lactated ringers bolus 1,000 mL (1,000 mLs Intravenous New Bag/Given 01/23/23 1448)    Mobility walks     Focused Assessments Cardiac Assessment Handoff:    No results found for: "CKTOTAL", "CKMB", "CKMBINDEX", "TROPONINI" No results found for: "DDIMER" Does the Patient currently have chest pain? No    R Recommendations: See Admitting Provider Note  Report given to:   Additional Notes: NA

## 2023-01-23 NOTE — Assessment & Plan Note (Addendum)
Symptomatic hypercalcemia.  Calcium quite elevated at > 15 today. Ca was bordering on low just 6 months ago. Presenting with generalized weakness, poor oral intake, mild lethargy.  She is on vitamin D, and calcium supplements daily, she tells me it was started 3 years ago and she has been on the same dose.  No change in medications.  Per med list she is also on denosumab.  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  At this time, no obvious malignancy identified.  -Differentials include vitamin D intoxication, new cancer or recurrence of cancer. - Check PTH, PTHrP levels - Vit D levels- 25, 1, 25 levels - 1.5 L LR bolus given - Continue N/s 100cc/hr x 20hrs - S/c Calcitonin -Pharmacy consult for choice of bisphosphonate considering elevated creatinine-  recommend 1 dose of 4mg  zoledronic acid.  -Hold home vitamin D and calcium supplements -EKG with some diffuse ST abnormalities, likely 2/ from Hypercalcemia, no chest pain, no dyspnea, cgeck troponin x 2, repeat EKG in a.m. - F/u blood cultures ordered in ED

## 2023-01-23 NOTE — H&P (Addendum)
History and Physical    Kathy [name] OZD:[id number] DOB: January 03, 1941 DOA: 01/23/2023  PCP: [name], MD  Patient coming from: Home  I have personally briefly reviewed patient's old medical records in [facility]  Chief Complaint: Weakness  HPI: Kathy [name] is a 82 y.o. female with medical history significant for thyroid cancer, hypertension, RBBB, rheumatoid arthritis. Patient presented to the ED with complaints of generalized weakness for about 5 days duration.  She reports poor oral intake for the past 5 days, no vomiting no loose stools.  No chest pain no difficulty breathing.  No lower extremity swelling.  No problems with urination.  She takes vitamin D and calcium supplements daily over the past 3 years and has been no change in dose.  There has been no change to her medications as far she knows over the past 6 months.  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 Patient lives alone.  ED Course: Stable vitals.  Calcium greater than 15.  Creatinine elevated 2.52.  Chest x-ray shows mild central pulmonary vascular congestion.  EKG shows some mild diffuse ST abnormalities.  UA not suggestive of UTI, shows 30 protein. 1.5 L LR bolus given.  Hospitalist to admit  Review of Systems: As per HPI all other systems reviewed and negative.  Past Medical History:  Diagnosis Date   Abnormal findings on diagnostic imaging of heart and coronary circulation    Anemia    FROM BLEEDING ULCER   Anxiety    takes Alprazolam daily as needed   Arthritis    dx with RA 2017   Bariatric surgery status    Cataract    Diverticulosis    Gastro-esophageal reflux disease with esophagitis    Gastrojejunal ulcer with hemorrhage    Headache(784.0)    occasionally   History of blood transfusion    no abnormal reaction noted   History of bronchitis    last time many yrs ago   Hyperlipidemia    PT DENIES THIS  DX -  ON NO MEDS AND NO ONE HAS TOLD HER   Hypertension    takes Amlodipine daily   Hypothyroidism    takes Synthroid daily   Insomnia    Joint pain    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   Localized edema    Nocturia    Numbness    occasionally left arm at night   Obesity    Osteoporosis    takes Fosamax weekly   Pain in joint involving pelvic region and thigh    Peripheral edema    takes Lasix daily as needed   PONV (postoperative nausea and vomiting)    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/2017   Rheumatoid arthritis, unspecified (HCC)    Rotator cuff arthropathy, 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   Sleep apnea    Phreesia 08/22/2020   Stomach ulcer    Thyroid disease    [employer]reesia 08/22/2020   Unspecified injury of muscle(s) and tendon(s) of the rotator cuff of left shoulder, subsequent encounter    Unspecified osteoarthritis, unspecified site    Wears glasses    Wears partial dentures     Past Surgical History:  Procedure Laterality Date   ABDOMINAL HYSTERECTOMY     partial   cataract surgery Bilateral    CHOLECYSTECTOMY     COLONOSCOPY     ESOPHAGOGASTRODUODENOSCOPY (EGD) WITH PROPOFOL N/A 01/11/2022   Procedure: ESOPHAGOGASTRODUODENOSCOPY (EGD)  WITH PROPOFOL;  Surgeon: Iva Boop, MD;  Location: [facility] ENDOSCOPY;  Service: Gastroenterology;  Laterality: N/A;   EYE SURGERY     CATARACTS BOTH   gastric bypass surgery     HOT HEMOSTASIS N/A 01/11/2022   Procedure: HOT HEMOSTASIS (ARGON PLASMA COAGULATION/BICAP);  Surgeon: Iva Boop, MD;  Location: [facility] ENDOSCOPY;  Service: Gastroenterology;  Laterality: N/A;   JOINT REPLACEMENT Bilateral    hip   REVERSE SHOULDER ARTHROPLASTY Right 05/11/2016   Procedure: REVERSE SHOULDER ARTHROPLASTY;  Surgeon: Teryl Lucy, MD;  Location: [facility] OR;  Service: Orthopedics;  Laterality: Right;   REVISION TOTAL HIP ARTHROPLASTY Left 10/03/2013   DR Turner Daniels   ROTATOR CUFF REPAIR     SCLEROTHERAPY  01/11/2022   Procedure:  Susa Day;  Surgeon: Iva Boop, MD;  Location: [facility] ENDOSCOPY;  Service: Gastroenterology;;   TOTAL HIP REVISION Left 10/03/2013   Procedure: TOTAL HIP REVISION- left;  Surgeon: Nestor Lewandowsky, MD;  Location: [facility] OR;  Service: Orthopedics;  Laterality: Left;   TOTAL KNEE ARTHROPLASTY Left 11/29/2017   Procedure: LEFT TOTAL KNEE ARTHROPLASTY;  Surgeon: Teryl Lucy, MD;  Location: [facility] OR;  Service: Orthopedics;  Laterality: Left;   TOTAL SHOULDER ARTHROPLASTY Left 11/09/2016   Procedure: TOTAL REVERSE SHOULDER ARTHROPLASTY;  Surgeon: Teryl Lucy, MD;  Location: [facility] OR;  Service: Orthopedics;  Laterality: Left;   TOTAL SHOULDER REPLACEMENT Left 10/2016   TOTAL THYROIDECTOMY     UPPER GASTROINTESTINAL ENDOSCOPY       reports that she has never smoked. She has never been exposed to tobacco smoke. She has never used smokeless tobacco. She reports that she does not drink alcohol and does not use drugs.  Allergies  Allergen Reactions   Aspirin Nausea And Vomiting and Other (See Comments)    Adult Aspirin: upset stomach / irritates ulcer  Other Reaction(s): GI Intolerance  Adult Aspirin: upset stomach / irritates ulcer , Adult Aspirin: upset stomach / irritates ulcer   Macrobid [Nitrofurantoin] Other (See Comments)    Stomach pain    Family History  Problem Relation Age of Onset   Diabetes Mother    Congestive Heart Failure Mother    Lung cancer Father    Thrombosis Sister    CAD Brother        CABG   Colon cancer Neg Hx    Esophageal cancer Neg Hx    Stomach cancer Neg Hx    Rectal cancer Neg Hx     Prior to Admission medications   Medication Sig Start Date End Date Taking? Authorizing Provider  acetaminophen (TYLENOL) 650 MG CR tablet Take 1,300 mg by mouth at bedtime.    [provider]  Adalimumab (HUMIRA PEN) 40 MG/0.4ML PNKT Inject 40 mg into the skin every Saturday.    [provider]  ALPRAZolam Prudy Feeler) 0.25 MG tablet Take 1 tablet (0.25 mg total)  by mouth 2 (two) times daily as needed for anxiety or sleep. 10/06/22   [name], MD  amLODipine (NORVASC) 5 MG tablet Take 1 tablet (5 mg total) by mouth daily. 07/19/22   [name], MD  Calcium Carbonate-Vitamin D (CALCIUM 600+D) 600-5 MG-MCG TABS Take 0.5-1 tablets by mouth See admin instructions. Take one tablet by mouth twice daily - with breakfast and supper; take 1/2 tablet with lunch    [provider]  Cholecalciferol (VITAMIN D3) 50 MCG (2000 UT) TABS Take 2,000 Units by mouth every morning.    [provider]  denosumab (PROLIA) 60  MG/ML SOSY injection inject 60mg  Subcutaneous every 6 months 180 days 10/07/22     diclofenac Sodium (VOLTAREN) 1 % GEL APPLY (1) GRAM TO AFFECTED AREA DAILY. 08/11/22   [name], MD  ferrous sulfate 325 (65 FE) MG EC tablet Take 1 tablet (325 mg total) by mouth daily. 03/17/22   Iva Boop, MD  furosemide (LASIX) 20 MG tablet Take 1 tablet (20 mg total) by mouth 2 (two) times daily. 03/16/22   [name], MD  levothyroxine (SYNTHROID) 88 MCG tablet Take 88 mcg by mouth daily before breakfast.    [provider]  Multiple Vitamin (MULTIVITAMIN WITH MINERALS) TABS tablet Take 1 tablet by mouth every morning.    [provider]  Omega-3 Fatty Acids (FISH OIL) 500 MG CAPS Take 500 mg by mouth every morning.    [provider]  omeprazole (PRILOSEC) 40 MG capsule Take 1 capsule (40 mg total) by mouth daily before breakfast. Open capsule and swallow granules with liquid or applesauce 04/15/22   Iva Boop, MD  oxycodone-acetaminophen (LYNOX) 2.5-300 MG per tablet Take 1 tablet by mouth at bedtime as needed for pain. 01/06/22   [provider]  Polyethyl Glycol-Propyl Glycol (SYSTANE) 0.4-0.3 % SOLN Place 1 drop into both eyes 2 (two) times daily.    [provider]  potassium chloride SA (KLOR-CON M) 20 MEQ tablet TAKE 1 TABLET (20 MEQ) BY MOUTH DAILY PRN with Lasix (EDEMA) 11/23/22    [name], MD  traMADol (ULTRAM) 50 MG tablet Take 50 mg by mouth every morning.    [provider]    Physical Exam: Vitals:   01/23/23 1120 01/23/23 1121 01/23/23 1125  BP: 129/77    Pulse: 83    Temp:   98.5 F (36.9 C)  TempSrc:   Oral  SpO2: 95%    Weight:  69.9 kg   Height:  5\' 1"  (1.549 m)     Constitutional: NAD, calm, comfortable Vitals:   01/23/23 1120 01/23/23 1121 01/23/23 1125  BP: 129/77    Pulse: 83    Temp:   98.5 F (36.9 C)  TempSrc:   Oral  SpO2: 95%    Weight:  69.9 kg   Height:  5\' 1"  (1.549 m)    Eyes: PERRL, lids and conjunctivae normal ENMT: Mucous membranes are moist.  Neck: normal, supple, no masses, no thyromegaly Respiratory: clear to auscultation bilaterally, no wheezing, no crackles. Normal respiratory effort. No accessory muscle use.  Cardiovascular: Regular rate and rhythm, no murmurs / rubs / gallops. No extremity edema. Extremities warm. Abdomen: no tenderness, no masses palpated. No hepatosplenomegaly. Bowel sounds positive.  Musculoskeletal: no clubbing / cyanosis.  Rheumatoid arthritis changes to fingers Skin: no rashes, lesions, ulcers. No induration Neurologic: Following directions, moving extremities spontaneously, 4+5 strength in all extremities.Marland Kitchen  Psychiatric: Normal judgment and insight. Awake, answering questions, slightly drowsy and oriented x 4. Normal mood.   Labs on Admission: I have personally reviewed following labs and imaging studies  CBC: Recent Labs  Lab 01/23/23 1207 01/23/23 1403  WBC 8.0  --   NEUTROABS 5.7  --   HGB 10.1* 11.2*  HCT 30.3* 33.0*  MCV 99.3  --   PLT 211  --    Basic Metabolic Panel: Recent Labs  Lab 01/23/23 1207 01/23/23 1403  NA 136 140  K 3.5 3.5  CL 97* 102  CO2 29  --   GLUCOSE 106* 81  BUN 54* 57*  CREATININE 2.52* 2.90*  CALCIUM >15.0*  --    GFR: Estimated Creatinine Clearance: 13.8 mL/min (A) (by C-G formula based on SCr of 2.9 mg/dL (H)). Liver  Function Tests: Recent Labs  Lab 01/23/23 1207  AST 104*  ALT 53*  ALKPHOS 52  BILITOT 0.6  PROT 9.6*  ALBUMIN 2.3*   Recent Labs  Lab 01/23/23 1207  LIPASE 167*   No results for input(s): "AMMONIA" in the last 168 hours. Coagulation Profile: Recent Labs  Lab 01/23/23 1400  INR 1.1   Urine analysis:    Component Value Date/Time   COLORURINE YELLOW 01/23/2023 1342   APPEARANCEUR CLOUDY (A) 01/23/2023 1342   LABSPEC 1.009 01/23/2023 1342   PHURINE 7.0 01/23/2023 1342   GLUCOSEU NEGATIVE 01/23/2023 1342   HGBUR NEGATIVE 01/23/2023 1342   BILIRUBINUR NEGATIVE 01/23/2023 1342   KETONESUR NEGATIVE 01/23/2023 1342   PROTEINUR 30 (A) 01/23/2023 1342   UROBILINOGEN 0.2 09/26/2013 1042   NITRITE NEGATIVE 01/23/2023 1342   LEUKOCYTESUR NEGATIVE 01/23/2023 1342    Radiological Exams on Admission: DG Chest Port 1 View  Result Date: 01/23/2023 CLINICAL DATA:  Sepsis. EXAM: PORTABLE CHEST 1 VIEW COMPARISON:  September 26, 2013. FINDINGS: Mild cardiomegaly is noted with mild central pulmonary vascular congestion. No consolidative process is noted. Status post bilateral shoulder arthroplasties. IMPRESSION: Mild cardiomegaly with mild central pulmonary vascular congestion. Electronically Signed   By: Lupita Raider M.D.   On: 01/23/2023 12:57    EKG: Independently reviewed.  Sinus rhythm, rate 74, RBBB.  QTc 396.  Diffuse ST abnormalities to lead V1, I, aVL, aVF, V4 through V6,  Assessment/Plan Principal Problem:   Hypercalcemia Active Problems:   Hypertension   Hypothyroid   Anxiety   Rheumatoid arthritis (HCC)   Malignant tumor of thyroid gland (HCC)   Leg edema   AKI (acute kidney injury) (HCC)  Assessment and Plan: * Hypercalcemia Symptomatic hypercalcemia.  Calcium quite elevated at > 15 today. Ca was bordering on low just 6 months ago. Presenting with generalized weakness, poor oral intake, mild lethargy.  She is on vitamin D, and calcium supplements daily, she tells  me it was started 3 years ago and she has been on the same dose.  No change in medications.  Per med list she is also on denosumab.  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  At this time, no obvious malignancy identified.  -Differentials include vitamin D intoxication, new cancer or recurrence of cancer. - Check PTH, PTHrP levels - Vit D levels- 25, 1, 25 levels - 1.5 L LR bolus given - Continue N/s 100cc/hr x 20hrs - S/c Calcitonin -Pharmacy consult for choice of bisphosphonate considering elevated creatinine-  recommend 1 dose of 4mg  zoledronic acid.  -Hold home vitamin D and calcium supplements -EKG with some diffuse ST abnormalities, likely 2/ from Hypercalcemia, no chest pain, no dyspnea, cgeck troponin x 2, repeat EKG in a.m. - F/u blood cultures ordered in ED   Hypertension Stable. - Pending med rec- resume Norvasc  AKI (acute kidney injury) (HCC) Cr elevated 2.52, baseline 0.8.  In the setting of hypocalcemia, and poor oral intake.  No history at this time to suggest urinary outlet obstruction. - Hydrate -If no significant improvement in renal function by a.m., consider renal ultrasound -Hold home as needed Lasix 20 mg  Leg edema No swelling at this time.  Chest x-ray showed mild central pulmonary vascular congestion.  BNP elevated at 848, no  prior to compare.  Last echo 03/2022 EF of 60 to 65%.  Follows with Dr. Wyline Mood. -IV fluids for now with significant hypercalcemia -Consider Lasix pending clinical course   Rheumatoid arthritis (HCC) Stable. On Humira.  Anxiety Resume Xanax  Hypothyroid Status post total thyroidectomy -Resume Synthroid   DVT prophylaxis: heparin Code Status: FULL-confirmed with patient at bedside Family Communication: None at bedside Disposition Plan: ~ 2 days Consults called: None Admission status: Inpt Tele I certify that at the point of admission it is my clinical judgment that the  patient will require inpatient hospital care spanning beyond 2 midnights from the point of admission due to high intensity of service, high risk for further deterioration and high frequency of surveillance required.   Author: Onnie Boer, MD 01/23/2023 6:18 PM  For on call review [email or web address].

## 2023-01-23 NOTE — Assessment & Plan Note (Addendum)
Stable. - Pending med rec- resume Norvasc

## 2023-01-23 NOTE — ED Provider Notes (Signed)
[place] EMERGENCY DEPARTMENT AT [facility] Provider Note   CSN: [id number] Arrival date & time: 01/23/23  1113     History  Chief Complaint  Patient presents with   Weakness    Kathy [name] is a 82 y.o. female.  HPI Elderly female presents with her sister who assists with the history.  She presents with concern for 1 week of progressive weakness.  She did have a fall, but denies any pain or substantial change in her condition following the fall which occurred yesterday. No reported change in medication, diet, activity.  Over the past week she has been progressively weak with difficulty performing ADL.  Today she went to urgent care and was sent here for evaluation.    Home Medications Prior to Admission medications   Medication Sig Start Date End Date Taking? Authorizing Provider  acetaminophen (TYLENOL) 650 MG CR tablet Take 1,300 mg by mouth at bedtime.    [provider]  Adalimumab (HUMIRA PEN) 40 MG/0.4ML PNKT Inject 40 mg into the skin every Saturday.    [provider]  ALPRAZolam Prudy Feeler) 0.25 MG tablet Take 1 tablet (0.25 mg total) by mouth 2 (two) times daily as needed for anxiety or sleep. 10/06/22   [name], MD  amLODipine (NORVASC) 5 MG tablet Take 1 tablet (5 mg total) by mouth daily. 07/19/22   [name], MD  Calcium Carbonate-Vitamin D (CALCIUM 600+D) 600-5 MG-MCG TABS Take 0.5-1 tablets by mouth See admin instructions. Take one tablet by mouth twice daily - with breakfast and supper; take 1/2 tablet with lunch    [provider]  Cholecalciferol (VITAMIN D3) 50 MCG (2000 UT) TABS Take 2,000 Units by mouth every morning.    [provider]  denosumab (PROLIA) 60 MG/ML SOSY injection inject 60mg  Subcutaneous every 6 months 180 days 10/07/22     diclofenac Sodium (VOLTAREN) 1 % GEL APPLY (1) GRAM TO AFFECTED AREA DAILY. 08/11/22   [name], MD  ferrous sulfate 325 (65 FE) MG EC tablet Take 1 tablet  (325 mg total) by mouth daily. 03/17/22   Iva Boop, MD  furosemide (LASIX) 20 MG tablet Take 1 tablet (20 mg total) by mouth 2 (two) times daily. 03/16/22   [name], MD  levothyroxine (SYNTHROID) 88 MCG tablet Take 88 mcg by mouth daily before breakfast.    [provider]  Multiple Vitamin (MULTIVITAMIN WITH MINERALS) TABS tablet Take 1 tablet by mouth every morning.    [provider]  Omega-3 Fatty Acids (FISH OIL) 500 MG CAPS Take 500 mg by mouth every morning.    [provider]  omeprazole (PRILOSEC) 40 MG capsule Take 1 capsule (40 mg total) by mouth daily before breakfast. Open capsule and swallow granules with liquid or applesauce 04/15/22   Iva Boop, MD  oxycodone-acetaminophen (LYNOX) 2.5-300 MG per tablet Take 1 tablet by mouth at bedtime as needed for pain. 01/06/22   [provider]  Polyethyl Glycol-Propyl Glycol (SYSTANE) 0.4-0.3 % SOLN Place 1 drop into both eyes 2 (two) times daily.    [provider]  potassium chloride SA (KLOR-CON M) 20 MEQ tablet TAKE 1 TABLET (20 MEQ) BY MOUTH DAILY PRN with Lasix (EDEMA) 11/23/22   [name], MD  traMADol (ULTRAM) 50 MG tablet Take 50 mg by mouth every morning.    [provider]      Allergies    Aspirin and Macrobid [nitrofurantoin]  Review of Systems   Review of Systems  All other systems reviewed and are negative.   Physical Exam Updated Vital Signs BP 129/77   Pulse 83   Temp 98.5 F (36.9 C) (Oral)   Ht 5\' 1"  (1.549 m)   Wt 69.9 kg   SpO2 95%   BMI 29.10 kg/m  Physical Exam Vitals and nursing note reviewed.  Constitutional:      General: She is not in acute distress.    Appearance: She is well-developed. She is ill-appearing. She is not toxic-appearing or diaphoretic.  HENT:     Head: Normocephalic and atraumatic.  Eyes:     Conjunctiva/sclera: Conjunctivae normal.  Cardiovascular:     Rate and Rhythm: Normal rate and regular rhythm.   Pulmonary:     Effort: Pulmonary effort is normal. No respiratory distress.     Breath sounds: Normal breath sounds. No stridor.  Abdominal:     General: There is no distension.     Tenderness: There is no abdominal tenderness. There is no guarding.  Skin:    General: Skin is warm and dry.  Neurological:     Mental Status: She is alert and oriented to person, place, and time.     Cranial Nerves: No cranial nerve deficit.     Motor: Atrophy present. No tremor.  Psychiatric:        Mood and Affect: Mood normal.     ED Results / Procedures / Treatments   Labs (all labs ordered are listed, but only abnormal results are displayed) Labs Reviewed  COMPREHENSIVE METABOLIC PANEL - Abnormal; Notable for the following components:      Result Value   Chloride 97 (*)    Glucose, Bld 106 (*)    BUN 54 (*)    Creatinine, Ser 2.52 (*)    Calcium >15.0 (*)    Total Protein 9.6 (*)    Albumin 2.3 (*)    AST 104 (*)    ALT 53 (*)    GFR, Estimated 19 (*)    All other components within normal limits  CBC WITH DIFFERENTIAL/PLATELET - Abnormal; Notable for the following components:   RBC 3.05 (*)    Hemoglobin 10.1 (*)    HCT 30.3 (*)    nRBC 0.4 (*)    All other components within normal limits  LIPASE, BLOOD - Abnormal; Notable for the following components:   Lipase 167 (*)    All other components within normal limits  BRAIN NATRIURETIC PEPTIDE - Abnormal; Notable for the following components:   B Natriuretic Peptide 848.0 (*)    All other components within normal limits  CULTURE, BLOOD (ROUTINE X 2)  CULTURE, BLOOD (ROUTINE X 2)  SARS CORONAVIRUS 2 BY RT PCR  LACTIC ACID, PLASMA  LACTIC ACID, PLASMA  URINALYSIS, W/ REFLEX TO CULTURE (INFECTION SUSPECTED)  PROTIME-INR  APTT  I-STAT CHEM 8, ED    EKG EKG Interpretation  Date/Time:  Sunday January 23 2023 12:43:23 EDT Ventricular Rate:  74 PR Interval:  175 QRS Duration: 146 QT Interval:  357 QTC Calculation: 396 R  Axis:   44 Text Interpretation: Sinus rhythm Right bundle branch block Confirmed by Gerhard Munch [phone]) on 01/23/2023 1:39:12 PM  Radiology DG Chest Port 1 View  Result Date: 01/23/2023 CLINICAL DATA:  Sepsis. EXAM: PORTABLE CHEST 1 VIEW COMPARISON:  September 26, 2013. FINDINGS: Mild cardiomegaly is noted with mild central pulmonary vascular congestion. No consolidative process is noted. Status post bilateral shoulder arthroplasties. IMPRESSION: Mild  cardiomegaly with mild central pulmonary vascular congestion. Electronically Signed   By: Lupita Raider M.D.   On: 01/23/2023 12:57    Procedures Procedures    Medications Ordered in ED Medications  lactated ringers bolus 1,000 mL (has no administration in time range)  lactated ringers bolus 500 mL (500 mLs Intravenous New Bag/Given 01/23/23 1345)    ED Course/ Medical Decision Making/ A&P                             Medical Decision Making Elderly female with multiple medical issues including anxiety, orthopedic issues, anemia presents with weakness.  Denial of focal pain, focal weakness is somewhat reassuring.  Initial vital signs reassuring: Cardiac 85 sinus normal Pulse ox 100% room air normal Broad differential including infection, anemia, malignancy considered. X-ray labs fluids urinalysis all started.  Amount and/or Complexity of Data Reviewed Independent Historian:     Details: Sister at bedside External Data Reviewed: notes.    Details: Cardiology summary included below Labs: ordered. Radiology: ordered and independent interpretation performed. Decision-making details documented in ED Course. ECG/medicine tests: ordered and independent interpretation performed. Decision-making details documented in ED Course.  Risk Prescription drug management. Decision regarding hospitalization. Diagnosis or treatment significantly limited by social determinants of health.  Cardiology summary: Clinical Summary Ms. Gullion is a 82  y.o.female seen today for follow up of the following medical problems.      1. Leg edema - echo 11/2016 LVEF 55-60%, no WMAs, grade I diastolic dysfunction, PASP 20, mild RV dilatation     03/2022 echo: LVEF 60-65%, no WMAs, indet diastolic fxn, normal RV function, normal IVC -overall controlled with lasix.    2. Palpitations - infrequent symptoms - 1-2 times per week, feeling of heart racing. Often comes when starting activity.  - TSH was low 03/2022, pcp lowered thyroid medicine.    - rare palpitations, typically with stress.    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 On my read I do not agree with left axis or bifacsicular block on computer read. RBBB there at least since 2015     4. HTN - she is compliant with meds   5. History of GI bleed - admit 12/2021 - on PPI without recurrence.  2:01 PM Patient in similar condition, hemodynamically unchanged.  Labs notable for evidence for acute renal failure, with creatinine triple value over 3 months ago.  In addition patient's BUN is 54, all consistent with prerenal, though the patient also has elevated BNP, but on exam is hypovolemic.  Patient was treated fluid resuscitation, and given her hypercalcemia, i-STAT was performed for ionized calcium. EKG does not show obvious changes for hyper calciumia, patient will require admission for ongoing fluid resuscitation, further monitoring.         Final Clinical Impression(s) / ED Diagnoses Final diagnoses:  Weakness  AKI (acute kidney injury) (HCC)  Hypercalcemia   Final diagnoses:  Weakness  AKI (acute kidney injury) (HCC)  Hypercalcemia   CRITICAL CARE Performed by: Gerhard Munch Total critical care time: 35 minutes Critical care time was exclusive of separately billable procedures and treating other patients. Critical care was necessary to treat or prevent imminent or life-threatening deterioration. Critical care was time spent  personally by me on the following activities: development of treatment plan with patient and/or surrogate as well as nursing, discussions with consultants, evaluation of patient's response to treatment, examination of  patient, obtaining history from patient or surrogate, ordering and performing treatments and interventions, ordering and review of laboratory studies, ordering and review of radiographic studies, pulse oximetry and re-evaluation of patient's condition.     Gerhard Munch, MD 01/23/23 [phone]

## 2023-01-23 NOTE — Assessment & Plan Note (Signed)
Cr elevated 2.52, baseline 0.8.  In the setting of hypocalcemia, and poor oral intake.  No history at this time to suggest urinary outlet obstruction. - Hydrate -If no significant improvement in renal function by a.m., consider renal ultrasound -Hold home as needed Lasix 20 mg

## 2023-01-23 NOTE — Assessment & Plan Note (Signed)
No swelling at this time.  Chest x-ray showed mild central pulmonary vascular congestion.  BNP elevated at 848, no prior to compare.  Last echo 03/2022 EF of 60 to 65%.  Follows with Dr. Wyline Mood. -IV fluids for now with significant hypercalcemia -Consider Lasix pending clinical course

## 2023-01-23 NOTE — Assessment & Plan Note (Signed)
Status post total thyroidectomy -Resume Synthroid

## 2023-01-23 NOTE — Assessment & Plan Note (Signed)
Resume Xanax

## 2023-01-24 ENCOUNTER — Other Ambulatory Visit (HOSPITAL_COMMUNITY): Payer: Self-pay

## 2023-01-24 ENCOUNTER — Inpatient Hospital Stay (HOSPITAL_COMMUNITY): Payer: 59

## 2023-01-24 DIAGNOSIS — F419 Anxiety disorder, unspecified: Secondary | ICD-10-CM

## 2023-01-24 DIAGNOSIS — M069 Rheumatoid arthritis, unspecified: Secondary | ICD-10-CM | POA: Diagnosis not present

## 2023-01-24 DIAGNOSIS — N179 Acute kidney failure, unspecified: Secondary | ICD-10-CM | POA: Diagnosis not present

## 2023-01-24 DIAGNOSIS — E038 Other specified hypothyroidism: Secondary | ICD-10-CM

## 2023-01-24 LAB — CBC
HCT: 25.8 % — ABNORMAL LOW (ref 36.0–46.0)
Hemoglobin: 8.7 g/dL — ABNORMAL LOW (ref 12.0–15.0)
MCH: 33.2 pg (ref 26.0–34.0)
MCHC: 33.7 g/dL (ref 30.0–36.0)
MCV: 98.5 fL (ref 80.0–100.0)
Platelets: 179 10*3/uL (ref 150–400)
RBC: 2.62 MIL/uL — ABNORMAL LOW (ref 3.87–5.11)
RDW: 15.2 % (ref 11.5–15.5)
WBC: 8.4 10*3/uL (ref 4.0–10.5)
nRBC: 0.5 % — ABNORMAL HIGH (ref 0.0–0.2)

## 2023-01-24 LAB — VITAMIN D 25 HYDROXY (VIT D DEFICIENCY, FRACTURES): Vit D, 25-Hydroxy: 58.88 ng/mL (ref 30–100)

## 2023-01-24 LAB — CULTURE, BLOOD (ROUTINE X 2)

## 2023-01-24 LAB — BASIC METABOLIC PANEL
Anion gap: 8 (ref 5–15)
BUN: 51 mg/dL — ABNORMAL HIGH (ref 8–23)
CO2: 27 mmol/L (ref 22–32)
Calcium: 14 mg/dL (ref 8.9–10.3)
Chloride: 101 mmol/L (ref 98–111)
Creatinine, Ser: 2.33 mg/dL — ABNORMAL HIGH (ref 0.44–1.00)
GFR, Estimated: 21 mL/min — ABNORMAL LOW (ref >60)
Glucose, Bld: 90 mg/dL (ref 70–99)
Potassium: 3.1 mmol/L — ABNORMAL LOW (ref 3.5–5.1)
Sodium: 136 mmol/L (ref 135–145)

## 2023-01-24 MED ORDER — POTASSIUM CHLORIDE CRYS ER 20 MEQ PO TBCR
40.0000 meq | EXTENDED_RELEASE_TABLET | Freq: Once | ORAL | Status: AC
  Administered 2023-01-24: 40 meq via ORAL
  Filled 2023-01-24: qty 2

## 2023-01-24 MED ORDER — SODIUM CHLORIDE 0.9 % IV SOLN: INTRAVENOUS | Status: DC

## 2023-01-24 MED ORDER — AMLODIPINE BESYLATE 5 MG PO TABS: 5.0000 mg | ORAL_TABLET | Freq: Every day | ORAL | Status: DC

## 2023-01-24 MED ORDER — AMLODIPINE BESYLATE 5 MG PO TABS
5.0000 mg | ORAL_TABLET | Freq: Every day | ORAL | Status: DC
  Administered 2023-01-25 – 2023-01-31 (×7): 5 mg via ORAL
  Filled 2023-01-24 (×7): qty 1

## 2023-01-24 MED ORDER — HYDROXYCHLOROQUINE SULFATE 200 MG PO TABS
200.0000 mg | ORAL_TABLET | Freq: Every day | ORAL | Status: DC
  Administered 2023-01-25 – 2023-01-31 (×7): 200 mg via ORAL
  Filled 2023-01-24 (×7): qty 1

## 2023-01-24 MED ORDER — ALPRAZOLAM 0.25 MG PO TABS
0.2500 mg | ORAL_TABLET | Freq: Two times a day (BID) | ORAL | Status: DC | PRN
  Administered 2023-01-24 – 2023-01-29 (×5): 0.25 mg via ORAL
  Filled 2023-01-24 (×5): qty 1

## 2023-01-24 NOTE — TOC CM/SW Note (Signed)
Transition of Care [facility]) - Inpatient Brief Assessment   Patient Details  Name: Kathy [name] MRN: [id number] Date of Birth: [birth year]  Transition of [facility]) CM/SW Contact:    Karn Cassis, LCSW Phone Number: 01/24/2023, 10:26 AM   Clinical Narrative: Pt admitted due to hypercalcemia. Pt reports she lives alone and is independent with ADLs at baseline. No home health services prior to admission. Pt feels she may need a cane. Pt is aware it is usually cheaper to get a cane from [employer] and plans to do that. No other needs reported at this time. TOC will follow.     Transition of Care Asessment: Insurance and Status: Insurance coverage has been reviewed Patient has primary care physician: Yes Home environment has been reviewed: Lives alone. Prior level of function:: Independent at baseline. Prior/Current Home Services: No current home services Social Determinants of Health Reivew: SDOH reviewed no interventions necessary Readmission risk has been reviewed: Yes Transition of care needs: no transition of care needs at this time

## 2023-01-24 NOTE — Progress Notes (Addendum)
PROGRESS NOTE   Kathy [name]  ZOX:[id number] DOB: [birth year] DOA: 01/23/2023 PCP: [name], MD   Chief Complaint  Patient presents with   Weakness   Level of care: Telemetry  Brief Admission History:  82 y.o. female with medical history significant for thyroid cancer, hypertension, RBBB, rheumatoid arthritis. Patient presented to the ED with complaints of generalized weakness for about 5 days duration.  She reports poor oral intake for the past 5 days, no vomiting no loose stools.  No chest pain no difficulty breathing.  No lower extremity swelling.  No problems with urination.  She takes vitamin D and calcium supplements daily over the past 3 years and has been no change in dose.  There has been no change to her medications as far she knows over the past 6 months.  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 Patient lives alone.   ED Course: Stable vitals.  Calcium greater than 15.  Creatinine elevated 2.52.  Chest x-ray shows mild central pulmonary vascular congestion.  EKG shows some mild diffuse ST abnormalities.  UA not suggestive of UTI, shows 30 protein.  1.5 L LR bolus given.  Hospitalist to admit   Assessment and Plan: Hypercalcemia - symptomatic  Symptomatic hypercalcemia due to excess supplementation Calcium quite elevated at > 15 on  presentation . Ca was bordering on low just 6 months ago. Presenting with generalized weakness, poor oral intake, mild lethargy.  She is on vitamin D, and calcium supplements daily, she tells me it was started 3 years ago and she has been on the same dose.  No change in medications.  Per med list she is also on denosumab.  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  At this time, no obvious malignancy identified.  -Differentials include vitamin D intoxication, new cancer or recurrence of cancer. - Check PTH, PTHrP  levels - Vit D levels- 25, 1, 25 levels - continue aggressive hydration with IV NS  - continue Calcitonin x 4 doses  - s/p 1 dose of 4mg  zoledronic acid for long-term control of hypercalcemia.  -Hold home vitamin D and calcium supplements and probably won't restart these at discharge -EKG with some diffuse ST abnormalities, likely 2/ from Hypercalcemia, no chest pain, no dyspnea, cgeck troponin x 2, repeat EKG in a.m. - repeat EKG in AM   AKI (acute kidney injury)  Cr elevated 2.52, baseline 0.8.  In the setting of hypocalcemia, and poor oral intake.  No history at this time to suggest urinary outlet obstruction.   - Hydrate -check renal US  -Hold home as needed Lasix 20 mg  Leg edema No swelling at this time.  Chest x-ray showed mild central pulmonary vascular congestion.  BNP elevated at 848, no prior to compare.  Last echo 03/2022 EF of 60 to 65%.  Follows with Dr. Wyline Mood. -IV fluids for now with significant hypercalcemia -Consider Lasix pending clinical course   Rheumatoid arthritis  Stable. On Humira.  Anxiety Resume Xanax as needed only given mild confusion  Hypothyroid Status post total thyroidectomy -Resume home levothyroxine  Hypertension Stable. - resume amlodipine   DVT prophylaxis:  heparin  Code Status: Full  Family Communication: telephone call to brother 6/24   Consultants:   Procedures:   Antimicrobials:    Subjective: Pt reports that she has been very confused and having a lot of fatigue and  weakness  Objective: Vitals:   01/24/23 0001 01/24/23 0437 01/24/23 0834 01/24/23 1408  BP: (!) 136/59 (!) 146/71 (!) 147/68 131/67  Pulse: 87 91 83 85  Resp: 18 18 16 16   Temp: 98.6 F (37 C) 98.6 F (37 C) 98.4 F (36.9 C) 98.3 F (36.8 C)  TempSrc:      SpO2: 92% 95% 96% 100%  Weight:      Height:        Intake/Output Summary (Last 24 hours) at 01/24/2023 1803 Last data filed at 01/24/2023 1502 Gross per 24 hour  Intake 1987.19 ml  Output --   Net 1987.19 ml   Filed Weights   01/23/23 1121 01/23/23 1640  Weight: 69.9 kg 70.4 kg   Examination:  General exam: Appears calm and comfortable  Respiratory system: Clear to auscultation. Respiratory effort normal. Cardiovascular system: normal S1 & S2 heard. No JVD, murmurs, rubs, gallops or clicks. No pedal edema. Gastrointestinal system: Abdomen is nondistended, soft and nontender. No organomegaly or masses felt. Normal bowel sounds heard. Central nervous system: Alert and oriented. No focal neurological deficits. Extremities: Symmetric 5 x 5 power. Skin: No rashes, lesions or ulcers. Psychiatry: Judgement and insight appear normal. Mood & affect appropriate.   Data Reviewed: I have personally reviewed following labs and imaging studies  CBC: Recent Labs  Lab 01/23/23 1207 01/23/23 1403 01/24/23 0442  WBC 8.0  --  8.4  NEUTROABS 5.7  --   --   HGB 10.1* 11.2* 8.7*  HCT 30.3* 33.0* 25.8*  MCV 99.3  --  98.5  PLT 211  --  179    Basic Metabolic Panel: Recent Labs  Lab 01/23/23 1207 01/23/23 1403 01/24/23 0442  NA 136 140 136  K 3.5 3.5 3.1*  CL 97* 102 101  CO2 29  --  27  GLUCOSE 106* 81 90  BUN 54* 57* 51*  CREATININE 2.52* 2.90* 2.33*  CALCIUM >15.0*  --  14.0*  MG 1.5*  --   --     CBG: No results for input(s): "GLUCAP" in the last 168 hours.  Recent Results (from the past 240 hour(s))  Blood Culture (routine x 2)     Status: None (Preliminary result)   Collection Time: 01/23/23 12:33 PM   Specimen: Left Antecubital; Blood  Result Value Ref Range Status   Specimen Description   Final    LEFT ANTECUBITAL BOTTLES DRAWN AEROBIC AND ANAEROBIC   Special Requests Blood Culture adequate volume  Final   Culture   Final    NO GROWTH < 24 HOURS Performed at [facility], [address], [place], [place] [address]    Report Status PENDING  Incomplete  Blood Culture (routine x 2)     Status: None (Preliminary result)   Collection Time: 01/23/23 12:38 PM    Specimen: Right Antecubital; Blood  Result Value Ref Range Status   Specimen Description   Final    RIGHT ANTECUBITAL BOTTLES DRAWN AEROBIC AND ANAEROBIC   Special Requests   Final    Blood Culture results may not be optimal due to an inadequate volume of blood received in culture bottles   Culture   Final    NO GROWTH < 24 HOURS Performed at [facility], [address] [place], [place] [address]    Report Status PENDING  Incomplete  SARS Coronavirus 2 by RT PCR (hospital order, performed in [facility] hospital lab) *cepheid single result test* Anterior Nasal Swab     Status: None  Collection Time: 01/23/23  4:20 PM   Specimen: Anterior Nasal Swab  Result Value Ref Range Status   SARS Coronavirus 2 by RT PCR NEGATIVE NEGATIVE Final    Comment: (NOTE) SARS-CoV-2 target nucleic acids are NOT DETECTED.  The SARS-CoV-2 RNA is generally detectable in upper and lower respiratory specimens during the acute phase of infection. The lowest concentration of SARS-CoV-2 viral copies this assay can detect is 250 copies / mL. A negative result does not preclude SARS-CoV-2 infection and should not be used as the sole basis for treatment or other patient management decisions.  A negative result may occur with improper specimen collection / handling, submission of specimen other than nasopharyngeal swab, presence of viral mutation(s) within the areas targeted by this assay, and inadequate number of viral copies (<250 copies / mL). A negative result must be combined with clinical observations, patient history, and epidemiological information.  Fact Sheet for Patients:   RoadLapTop.co.za  Fact Sheet for Healthcare Providers: http://kim-miller.com/  This test is not yet approved or  cleared by the [place] FDA and has been authorized for detection and/or diagnosis of SARS-CoV-2 by FDA under an Emergency Use Authorization (EUA).  This EUA will  remain in effect (meaning this test can be used) for the duration of the COVID-19 declaration under Section 564(b)(1) of the Act, 21 U.S.C. section 360bbb-3(b)(1), unless the authorization is terminated or revoked sooner.  Performed at [facility], [address], [place], [place] [address]      Radiology Studies: [facility] Chest [facility] 1 View  Result Date: 01/23/2023 CLINICAL DATA:  Sepsis. EXAM: PORTABLE CHEST 1 VIEW COMPARISON:  September 26, 2013. FINDINGS: Mild cardiomegaly is noted with mild central pulmonary vascular congestion. No consolidative process is noted. Status post bilateral shoulder arthroplasties. IMPRESSION: Mild cardiomegaly with mild central pulmonary vascular congestion. Electronically Signed   By: Lupita Raider M.D.   On: 01/23/2023 12:57    Scheduled Meds:  calcitonin  4 Units/kg Subcutaneous BID   heparin  5,000 Units Subcutaneous Q8H   levothyroxine  88 mcg Oral QAC breakfast   pantoprazole  40 mg Oral Daily   Continuous Infusions:  sodium chloride 100 mL/hr at 01/24/23 1015     LOS: 1 day   Time spent: 36 mins  Daniya Aramburo Laural Benes, MD How to contact the [facility] Attending or Consulting provider 7A - 7P or covering provider during after hours 7P -7A, for this patient?  Check the care team in [facility] and look for a) attending/consulting TRH provider listed and b) the [facility] team listed Log into [email or web address] and use [facility]'s universal password to access. If you do not have the password, please contact the hospital operator. Locate the [facility] provider you are looking for under [facility] and page to a number that you can be directly reached. If you still have difficulty reaching the provider, please page the [facility] (Director on Call) for the Hospitalists listed on amion for assistance.  01/24/2023, 6:03 PM

## 2023-01-24 NOTE — Progress Notes (Signed)
Patient stated that she had a fall at home Sunday and hit her head and she feels like her memory is bad from the fall. Notified Dr. Laural Benes see new orders.

## 2023-01-24 NOTE — Progress Notes (Signed)
Lab called with a critical value of a calcium of 14 at 0548.  Notified provider on call.   Provided responded with acknowledgement of critical value at 0550. No new orders received at this time.

## 2023-01-24 NOTE — Progress Notes (Addendum)
Mobility Specialist Progress Note:    01/24/23 0915  Mobility  Activity Transferred from bed to chair  Level of Assistance Maximum assist, patient does 25-49%  Assistive Device Front wheel walker  Distance Ambulated (ft) 3 ft  Range of Motion/Exercises Passive;Left leg  Activity Response Tolerated well  Mobility Referral Yes  $Mobility charge 1 Mobility  Mobility Specialist Start Time (ACUTE ONLY) 0915  Mobility Specialist Stop Time (ACUTE ONLY) 0925  Mobility Specialist Time Calculation (min) (ACUTE ONLY) 10 min   Pt received in bed, agreeable to transfer B>C. Tolerated well, asx throughout. Has difficulty straightening LLE to stand, Max A +2 required for safety. Left pt in chair, alarm on, all needs met.   Feliciana Rossetti Mobility Specialist Please contact via Special educational needs teacher or  Rehab office at [phone]

## 2023-01-24 NOTE — Hospital Course (Signed)
82 y.o. female with medical history significant for thyroid cancer, hypertension, RBBB, rheumatoid arthritis. Patient presented to the ED with complaints of generalized weakness for about 5 days duration.  She reports poor oral intake for the past 5 days, no vomiting no loose stools.  No chest pain no difficulty breathing.  No lower extremity swelling.  No problems with urination.  She takes vitamin D and calcium supplements daily over the past 3 years and has been no change in dose.  There has been no change to her medications as far she knows over the past 6 months.  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 Patient lives alone.   ED Course: Stable vitals.  Calcium greater than 15.  Creatinine elevated 2.52.  Chest x-ray shows mild central pulmonary vascular congestion.  EKG shows some mild diffuse ST abnormalities.  UA not suggestive of UTI, shows 30 protein.  1.5 L LR bolus given.  Hospitalist to admit

## 2023-01-25 ENCOUNTER — Inpatient Hospital Stay (HOSPITAL_COMMUNITY): Payer: 59

## 2023-01-25 DIAGNOSIS — E038 Other specified hypothyroidism: Secondary | ICD-10-CM | POA: Diagnosis not present

## 2023-01-25 DIAGNOSIS — M069 Rheumatoid arthritis, unspecified: Secondary | ICD-10-CM | POA: Diagnosis not present

## 2023-01-25 DIAGNOSIS — N179 Acute kidney failure, unspecified: Secondary | ICD-10-CM | POA: Diagnosis not present

## 2023-01-25 LAB — CULTURE, BLOOD (ROUTINE X 2): Special Requests: ADEQUATE

## 2023-01-25 LAB — BASIC METABOLIC PANEL
Anion gap: 11 (ref 5–15)
BUN: 42 mg/dL — ABNORMAL HIGH (ref 8–23)
CO2: 23 mmol/L (ref 22–32)
Calcium: 11.6 mg/dL — ABNORMAL HIGH (ref 8.9–10.3)
Chloride: 104 mmol/L (ref 98–111)
Creatinine, Ser: 2.29 mg/dL — ABNORMAL HIGH (ref 0.44–1.00)
GFR, Estimated: 21 mL/min — ABNORMAL LOW (ref >60)
Glucose, Bld: 91 mg/dL (ref 70–99)
Potassium: 3.2 mmol/L — ABNORMAL LOW (ref 3.5–5.1)
Sodium: 138 mmol/L (ref 135–145)

## 2023-01-25 LAB — ALBUMIN: Albumin: 2.1 g/dL — ABNORMAL LOW (ref 3.5–5.0)

## 2023-01-25 LAB — PARATHYROID HORMONE, INTACT (NO CA): PTH: 5 pg/mL — ABNORMAL LOW (ref 15–65)

## 2023-01-25 LAB — CALCITRIOL (1,25 DI-OH VIT D): Vit D, 1,25-Dihydroxy: 15.3 pg/mL — ABNORMAL LOW (ref 24.8–81.5)

## 2023-01-25 MED ORDER — POTASSIUM CHLORIDE CRYS ER 20 MEQ PO TBCR
40.0000 meq | EXTENDED_RELEASE_TABLET | Freq: Once | ORAL | Status: AC
  Administered 2023-01-25: 40 meq via ORAL
  Filled 2023-01-25: qty 2

## 2023-01-25 NOTE — Progress Notes (Addendum)
PROGRESS NOTE   Kathy [name]  ZOX:[id number] DOB: [birth year] DOA: 01/23/2023 PCP: [name], MD   Chief Complaint  Patient presents with   Weakness   Level of care: Telemetry  Brief Admission History:  82 y.o. female with medical history significant for thyroid cancer, hypertension, RBBB, rheumatoid arthritis. Patient presented to the ED with complaints of generalized weakness for about 5 days duration.  She reports poor oral intake for the past 5 days, no vomiting no loose stools.  No chest pain no difficulty breathing.  No lower extremity swelling.  No problems with urination.  She takes vitamin D and calcium supplements daily over the past 3 years and has been no change in dose.  There has been no change to her medications as far she knows over the past 6 months.  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 Patient lives alone.   ED Course: Stable vitals.  Calcium greater than 15.  Creatinine elevated 2.52.  Chest x-ray shows mild central pulmonary vascular congestion.  EKG shows some mild diffuse ST abnormalities.  UA not suggestive of UTI, shows 30 protein.  1.5 L LR bolus given.  Hospitalist to admit   Assessment and Plan: Hypercalcemia - symptomatic  Symptomatic hypercalcemia due to excess supplementation Calcium quite elevated at > 15 on  presentation. Ca++ was bordering on low just 6 months ago. Presenting with generalized weakness, poor oral intake, mild lethargy.  She is on vitamin D, and calcium supplements daily, she tells me it was started 3 years ago and she has been on the same dose.  No change in medications.  Per med list she is also on denosumab.  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  At this time, no obvious malignancy identified.  - Differentials include vitamin D intoxication, new cancer or recurrence of cancer. - follow up PTH  intact - 5 (suppressed) - Vit D levels- 25, 1, 25 levels: 58.88, 15.3  - continue hydration with IV NS  - continue Calcitonin x 4 doses  - s/p 1 dose of 4mg  zoledronic acid for long-term control of hypercalcemia.  -Hold home vitamin D and calcium supplements  -EKG with some diffuse ST abnormalities, likely 2/ from Hypercalcemia, no chest pain, no dyspnea, cgeck troponin x 2, repeat EKG in a.m. - repeat EKG in AM (this was not done) requested again  AKI (acute kidney injury)  Cr elevated 2.52, baseline 0.8.  In the setting of hypocalcemia, and poor oral intake.  No history at this time to suggest urinary outlet obstruction.   - Hydrate -check renal US : No collecting system dilatation. Left kidney is partially obscured by overlapping bowel gas and soft tissue. -Hold home as needed Lasix 20 mg  Leg edema No swelling at this time.  Chest x-ray showed mild central pulmonary vascular congestion.  BNP elevated at 848, no prior to compare.  Last echo 03/2022 EF of 60 to 65%.  Follows with Dr. Wyline Mood. -IV fluids for now with significant hypercalcemia -Consider resumption of furosemide if appropriate   Rheumatoid arthritis  - with AKI, temporarily reduced dose of hydroxychloroquine by 50%  Anxiety Resumed low dose alprazolam as needed only given mild confusion  Hypothyroid Status post total thyroidectomy -Resumed home levothyroxine  Hypertension - resumed amlodipine with acceptable control    DVT prophylaxis: [place] heparin  Code Status: Full  Family Communication:  telephone call to brother 6/24   Consultants:   Procedures:   Antimicrobials:    Subjective: Kathy [name] reports that she has been very confused and having a lot of fatigue and weakness  Objective: Vitals:   01/24/23 2113 01/24/23 2348 01/25/23 0355 01/25/23 1308  BP: (!) 146/76 139/64 (!) 144/71 135/71  Pulse: 98 89 88 91  Resp: 18 16 18 16   Temp: 98.4 F (36.9 C) 98.7 F (37.1 C) 98.1 F (36.7 C) 98.6 F (37 C)   TempSrc: Oral  Oral Oral  SpO2: 96% 94% 97% 94%  Weight:      Height:        Intake/Output Summary (Last 24 hours) at 01/25/2023 1614 Last data filed at 01/25/2023 1300 Gross per 24 hour  Intake 1304.14 ml  Output --  Net 1304.14 ml   Filed Weights   01/23/23 1121 01/23/23 1640  Weight: 69.9 kg 70.4 kg   Examination:  General exam: Appears calm and comfortable  Respiratory system: Clear to auscultation. Respiratory effort normal. Cardiovascular system: normal S1 & S2 heard. No JVD, murmurs, rubs, gallops or clicks. No pedal edema. Gastrointestinal system: Abdomen is nondistended, soft and nontender. No organomegaly or masses felt. Normal bowel sounds heard. Central nervous system: Alert and oriented. No focal neurological deficits. Extremities: Symmetric 5 x 5 power. Skin: No rashes, lesions or ulcers. Psychiatry: Judgement and insight appear normal. Mood & affect appropriate.   Data Reviewed: I have personally reviewed following labs and imaging studies  CBC: Recent Labs  Lab 01/23/23 1207 01/23/23 1403 01/24/23 0442  WBC 8.0  --  8.4  NEUTROABS 5.7  --   --   HGB 10.1* 11.2* 8.7*  HCT 30.3* 33.0* 25.8*  MCV 99.3  --  98.5  PLT 211  --  179    Basic Metabolic Panel: Recent Labs  Lab 01/23/23 1207 01/23/23 1403 01/24/23 0442 01/25/23 0829  NA 136 140 136 138  K 3.5 3.5 3.1* 3.2*  CL 97* 102 101 104  CO2 29  --  27 23  GLUCOSE 106* 81 90 91  BUN 54* 57* 51* 42*  CREATININE 2.52* 2.90* 2.33* 2.29*  CALCIUM >15.0*  --  14.0* 11.6*  MG 1.5*  --   --   --     CBG: No results for input(s): "GLUCAP" in the last 168 hours.  Recent Results (from the past 240 hour(s))  Blood Culture (routine x 2)     Status: None (Preliminary result)   Collection Time: 01/23/23 12:33 PM   Specimen: Left Antecubital; Blood  Result Value Ref Range Status   Specimen Description   Final    LEFT ANTECUBITAL BOTTLES DRAWN AEROBIC AND ANAEROBIC   Special Requests Blood Culture  adequate volume  Final   Culture   Final    NO GROWTH 2 DAYS Performed at [facility], [address], [place], [place] [address]    Report Status PENDING  Incomplete  Blood Culture (routine x 2)     Status: None (Preliminary result)   Collection Time: 01/23/23 12:38 PM   Specimen: Right Antecubital; Blood  Result Value Ref Range Status   Specimen Description   Final    RIGHT ANTECUBITAL BOTTLES DRAWN AEROBIC AND ANAEROBIC   Special Requests   Final    Blood Culture results may not be optimal due to an inadequate volume of blood received in culture bottles   Culture   Final    NO GROWTH 2 DAYS Performed at [facility]  [facility], [address], [place], [place] [address]    Report Status PENDING  Incomplete  SARS Coronavirus 2 by RT PCR (hospital order, performed in [facility] hospital lab) *cepheid single result test* Anterior Nasal Swab     Status: None   Collection Time: 01/23/23  4:20 PM   Specimen: Anterior Nasal Swab  Result Value Ref Range Status   SARS Coronavirus 2 by RT PCR NEGATIVE NEGATIVE Final    Comment: (NOTE) SARS-CoV-2 target nucleic acids are NOT DETECTED.  The SARS-CoV-2 RNA is generally detectable in upper and lower respiratory specimens during the acute phase of infection. The lowest concentration of SARS-CoV-2 viral copies this assay can detect is 250 copies / mL. A negative result does not preclude SARS-CoV-2 infection and should not be used as the sole basis for treatment or other patient management decisions.  A negative result may occur with improper specimen collection / handling, submission of specimen other than nasopharyngeal swab, presence of viral mutation(s) within the areas targeted by this assay, and inadequate number of viral copies (<250 copies / mL). A negative result must be combined with clinical observations, patient history, and epidemiological information.  Fact Sheet for Patients:   RoadLapTop.co.za  Fact Sheet for  Healthcare Providers: http://kim-miller.com/  This test is not yet approved or  cleared by the [place] FDA and has been authorized for detection and/or diagnosis of SARS-CoV-2 by FDA under an Emergency Use Authorization (EUA).  This EUA will remain in effect (meaning this test can be used) for the duration of the COVID-19 declaration under Section 564(b)(1) of the Act, 21 U.S.C. section 360bbb-3(b)(1), unless the authorization is terminated or revoked sooner.  Performed at [facility], [address] [place], [place] [address]      Radiology Studies: US RENAL  Result Date: 01/25/2023 CLINICAL DATA:  Acute kidney insufficiency EXAM: RENAL / URINARY TRACT ULTRASOUND COMPLETE COMPARISON:  None Available. FINDINGS: Right Kidney: Renal measurements: 10.6 x 3.6 x 5.2 cm = volume: 103.8 mL. Echogenicity within normal limits. No mass or hydronephrosis visualized. Left Kidney: Renal measurements: 9.1 x 4.7 x 5.5 cm = volume: No collecting system dilatation or perinephric fluid although significant portions are obscured by overlapping bowel gas and soft tissue. ML. Echogenicity within normal limits. No mass or hydronephrosis visualized. Bladder: Appears normal for degree of bladder distention. Other: None. IMPRESSION: No collecting system dilatation. Left kidney is partially obscured by overlapping bowel gas and soft tissue. Electronically Signed   By: Karen Kays M.D.   On: 01/25/2023 13:10   CT HEAD WO CONTRAST ( )  Result Date: 01/24/2023 CLINICAL DATA:  Headache. EXAM: CT HEAD WITHOUT CONTRAST TECHNIQUE: Contiguous axial images were obtained from the base of the skull through the vertex without intravenous contrast. RADIATION DOSE REDUCTION: This exam was performed according to the departmental dose-optimization program which includes automated exposure control, adjustment of the mA and/or kV according to patient size and/or use of iterative reconstruction technique.  COMPARISON:  Head CT dated 06/09/2017. FINDINGS: Brain: Mild age-related atrophy and chronic microvascular ischemic changes. There is no acute intracranial hemorrhage. No mass effect or midline shift. No extra-axial fluid collection. Vascular: No hyperdense vessel or unexpected calcification. Skull: Normal. Negative for fracture or focal lesion. Sinuses/Orbits: No acute finding. Other: None IMPRESSION: 1. No acute intracranial pathology. 2. Mild age-related atrophy and chronic microvascular ischemic changes. Electronically Signed   By: Elgie Collard M.D.   On: 01/24/2023 19:46    Scheduled Meds:  amLODipine  5 mg Oral  Daily   heparin  5,000 Units Subcutaneous Q8H   hydroxychloroquine  200 mg Oral Daily   levothyroxine  88 mcg Oral QAC breakfast   pantoprazole  40 mg Oral Daily   potassium chloride  40 mEq Oral Once   Continuous Infusions:  sodium chloride 100 mL/hr at 01/25/23 0601     LOS: 2 days   Time spent: 35 mins  [place] Laural Benes, MD How to contact the [facility] Attending or Consulting provider 7A - 7P or covering provider during after hours 7P -7A, for this patient?  Check the care team in [facility] and look for a) attending/consulting TR[facility] provider listed and b) the [facility] team listed Log into [email or web address] and use 's universal password to access. If you do not have the password, please contact the hospital operator. Locate the [facility] provider you are looking for under [facility] and page to a number that you can be directly reached. If you still have difficulty reaching the provider, please page the [facility] (Director on Call) for the Hospitalists listed on amion for assistance.  01/25/2023, 4:14 PM

## 2023-01-25 NOTE — Progress Notes (Signed)
Pt

## 2023-01-25 NOTE — Plan of Care (Signed)
  Problem: Education: Goal: Knowledge of General Education information will improve Description Including pain rating scale, medication(s)/side effects and non-pharmacologic comfort measures Outcome: Progressing   Problem: Health Behavior/Discharge Planning: Goal: Ability to manage health-related needs will improve Outcome: Progressing   Problem: Clinical Measurements: Goal: Will remain free from infection Outcome: Progressing Goal: Diagnostic test results will improve Outcome: Progressing Goal: Respiratory complications will improve Outcome: Progressing Goal: Cardiovascular complication will be avoided Outcome: Progressing   Problem: Coping: Goal: Level of anxiety will decrease Outcome: Progressing   Problem: Elimination: Goal: Will not experience complications related to bowel motility Outcome: Progressing Goal: Will not experience complications related to urinary retention Outcome: Progressing   Problem: Pain Managment: Goal: General experience of comfort will improve Outcome: Progressing   Problem: Safety: Goal: Ability to remain free from injury will improve Outcome: Progressing   Problem: Skin Integrity: Goal: Risk for impaired skin integrity will decrease Outcome: Progressing   Problem: Clinical Measurements: Goal: Ability to maintain clinical measurements within normal limits will improve Outcome: Not Progressing   Problem: Activity: Goal: Risk for activity intolerance will decrease Outcome: Not Progressing   Problem: Nutrition: Goal: Adequate nutrition will be maintained Outcome: Not Progressing   

## 2023-01-25 NOTE — Progress Notes (Signed)
Patient is still very weak and requiring 2+ transfers to chair or BSC from bed. Patient continues to be alert and oriented x4 but can be forgetful / confused at times.

## 2023-01-26 LAB — RETICULOCYTES
Immature Retic Fract: 14.2 % (ref 2.3–15.9)
RBC.: 2.26 MIL/uL — ABNORMAL LOW (ref 3.87–5.11)
Retic Count, Absolute: 32.1 10*3/uL (ref 19.0–186.0)
Retic Ct Pct: 1.4 % (ref 0.4–3.1)

## 2023-01-26 LAB — CBC
HCT: 23.1 % — ABNORMAL LOW (ref 36.0–46.0)
Hemoglobin: 7.5 g/dL — ABNORMAL LOW (ref 12.0–15.0)
MCH: 32.9 pg (ref 26.0–34.0)
MCHC: 32.5 g/dL (ref 30.0–36.0)
MCV: 101.3 fL — ABNORMAL HIGH (ref 80.0–100.0)
Platelets: 192 10*3/uL (ref 150–400)
RBC: 2.28 MIL/uL — ABNORMAL LOW (ref 3.87–5.11)
RDW: 15.5 % (ref 11.5–15.5)
WBC: 6.8 10*3/uL (ref 4.0–10.5)
nRBC: 0 % (ref 0.0–0.2)

## 2023-01-26 LAB — VITAMIN B12: Vitamin B-12: 1348 pg/mL — ABNORMAL HIGH (ref 180–914)

## 2023-01-26 LAB — CULTURE, BLOOD (ROUTINE X 2)

## 2023-01-26 LAB — BASIC METABOLIC PANEL
Anion gap: 9 (ref 5–15)
BUN: 31 mg/dL — ABNORMAL HIGH (ref 8–23)
CO2: 23 mmol/L (ref 22–32)
Calcium: 10.4 mg/dL — ABNORMAL HIGH (ref 8.9–10.3)
Chloride: 108 mmol/L (ref 98–111)
Creatinine, Ser: 1.94 mg/dL — ABNORMAL HIGH (ref 0.44–1.00)
GFR, Estimated: 26 mL/min — ABNORMAL LOW (ref >60)
Glucose, Bld: 87 mg/dL (ref 70–99)
Potassium: 3.3 mmol/L — ABNORMAL LOW (ref 3.5–5.1)
Sodium: 140 mmol/L (ref 135–145)

## 2023-01-26 LAB — IRON AND TIBC
Iron: 67 ug/dL (ref 28–170)
Saturation Ratios: 36 % — ABNORMAL HIGH (ref 10.4–31.8)
TIBC: 184 ug/dL — ABNORMAL LOW (ref 250–450)
UIBC: 117 ug/dL

## 2023-01-26 LAB — MAGNESIUM: Magnesium: 1.2 mg/dL — ABNORMAL LOW (ref 1.7–2.4)

## 2023-01-26 LAB — FERRITIN: Ferritin: 101 ng/mL (ref 11–307)

## 2023-01-26 LAB — HEMOGLOBIN AND HEMATOCRIT, BLOOD
HCT: 24.5 % — ABNORMAL LOW (ref 36.0–46.0)
Hemoglobin: 8 g/dL — ABNORMAL LOW (ref 12.0–15.0)

## 2023-01-26 LAB — ALBUMIN: Albumin: 1.8 g/dL — ABNORMAL LOW (ref 3.5–5.0)

## 2023-01-26 LAB — FOLATE: Folate: 9.1 ng/mL (ref >5.9)

## 2023-01-26 MED ORDER — POTASSIUM CHLORIDE CRYS ER 20 MEQ PO TBCR
40.0000 meq | EXTENDED_RELEASE_TABLET | Freq: Two times a day (BID) | ORAL | Status: AC
  Administered 2023-01-26 (×2): 40 meq via ORAL
  Filled 2023-01-26 (×2): qty 2

## 2023-01-26 MED ORDER — PANTOPRAZOLE SODIUM 40 MG PO TBEC
40.0000 mg | DELAYED_RELEASE_TABLET | Freq: Two times a day (BID) | ORAL | Status: DC
  Administered 2023-01-26 – 2023-01-31 (×11): 40 mg via ORAL
  Filled 2023-01-26 (×11): qty 1

## 2023-01-26 MED ORDER — MAGNESIUM SULFATE 4 GM/100ML IV SOLN
4.0000 g | Freq: Once | INTRAVENOUS | Status: AC
  Administered 2023-01-26: 4 g via INTRAVENOUS
  Filled 2023-01-26: qty 100

## 2023-01-26 NOTE — Plan of Care (Signed)
  Problem: Acute Rehab PT Goals(only PT should resolve) Goal: Pt Will Go Supine/Side To Sit Outcome: Progressing Flowsheets (Taken 01/26/2023 1218) Pt will go Supine/Side to Sit:  with minimal assist  with min guard assist Goal: Patient Will Transfer Sit To/From Stand Outcome: Progressing Flowsheets (Taken 01/26/2023 1218) Patient will transfer sit to/from stand:  with minimal assist  with min guard assist Goal: Pt Will Transfer Bed To Chair/Chair To Bed Outcome: Progressing Flowsheets (Taken 01/26/2023 1218) Pt will Transfer Bed to Chair/Chair to Bed:  min guard assist  with min assist Goal: Pt Will Ambulate Outcome: Progressing Flowsheets (Taken 01/26/2023 1218) Pt will Ambulate:  50 feet  with minimal assist  with rolling walker   12:18 PM, 01/26/23 [name], MPT Physical Therapist with [facility] [phone] office [phone] mobile phone

## 2023-01-26 NOTE — Evaluation (Signed)
Physical Therapy Evaluation Patient Details Name: Kathy [name] MRN: [id number] DOB: May 04, 1941 Today's Date: 01/26/2023  History of Present Illness  Kathy [name] is a 82 y.o. female with medical history significant for thyroid cancer, hypertension, RBBB, rheumatoid arthritis.  Patient presented to the ED with complaints of generalized weakness for about 5 days duration.  She reports poor oral intake for the past 5 days, no vomiting no loose stools.  No chest pain no difficulty breathing.  No lower extremity swelling.  No problems with urination.  She takes vitamin D and calcium supplements daily over the past 3 years and has been no change in dose.  There has been no change to her medications as far she knows over the past 6 months.  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  Patient lives alone.   Clinical Impression  Patient has to use bed rail for completing supine to sitting, unable to lift legs onto bed during sit to supine, has most difficulty completing sit to stands due weakness and baseline stiffness in left knee with quadriceps contractures, able to ambulate in room with slow labored cadence, increased time for making turns, limited for gait training mostly due to c/o fatigue and weakness.  Patient tolerated sitting up in chair after therapy.  Patient will benefit from continued skilled physical therapy in hospital and recommended venue below to increase strength, balance, endurance for safe ADLs and gait.       Recommendations for follow up therapy are one component of a multi-disciplinary discharge planning process, led by the attending physician.  Recommendations may be updated based on patient status, additional functional criteria and insurance authorization.  Follow Up Recommendations Can patient physically be transported by private vehicle: Yes     Assistance Recommended at Discharge Set up  Supervision/Assistance  Patient can return home with the following  A lot of help with bathing/dressing/bathroom;A lot of help with walking and/or transfers;Help with stairs or ramp for entrance;Assistance with cooking/housework    Equipment Recommendations None recommended by PT  Recommendations for Other Services       Functional Status Assessment Patient has had a recent decline in their functional status and demonstrates the ability to make significant improvements in function in a reasonable and predictable amount of time.     Precautions / Restrictions Precautions Precautions: Fall Restrictions Weight Bearing Restrictions: No      Mobility  Bed Mobility Overal bed mobility: Needs Assistance Bed Mobility: Supine to Sit, Sit to Supine     Supine to sit: Min guard, Min assist Sit to supine: Min assist, Mod assist   General bed mobility comments: had to use bed rail for completing supine to sitting, unable to lift legs onto bed during sit to supine    Transfers Overall transfer level: Needs assistance Equipment used: Rolling walker (2 wheels) Transfers: Sit to/from Stand, Bed to chair/wheelchair/BSC Sit to Stand: Mod assist   Step pivot transfers: Min assist, Mod assist       General transfer comment: unsteady labored movement with most diffiuclty completing sit to stands due LLE stiffness, limited knee flexion    Ambulation/Gait Ambulation/Gait assistance: Min assist, Mod assist Gait Distance (Feet): 22 Feet Assistive device: Rolling walker (2 wheels) Gait Pattern/deviations: Decreased step length - left, Decreased stance time - right, Decreased stride length Gait velocity: slow     General Gait Details: slow labored cadence with limited left knee flexion due  to stiffness (knee stiffness is baseline), increased time for making turns, limited mostly due to c/o weakness and fatigue  Stairs            Wheelchair Mobility    Modified Rankin (Stroke  Patients Only)       Balance Overall balance assessment: Needs assistance Sitting-balance support: Feet supported, No upper extremity supported Sitting balance-Leahy Scale: Fair Sitting balance - Comments: fair/good seated at EOB   Standing balance support: During functional activity, Bilateral upper extremity supported Standing balance-Leahy Scale: Poor Standing balance comment: fair/poor using RW                             Pertinent Vitals/Pain Pain Assessment Pain Assessment: 0-10 Pain Score: 6  Pain Location: chronic low back Pain Descriptors / Indicators: Sore, Aching Pain Intervention(s): Limited activity within patient's tolerance, Monitored during session, Repositioned    Home Living Family/patient expects to be discharged to:: Private residence Living Arrangements: Alone Available Help at Discharge: Family;Available PRN/intermittently Type of Home: House Home Access: Stairs to enter Entrance Stairs-Rails: Right;Can reach both Entrance Stairs-Number of Steps: 4   Home Layout: One level Home Equipment: Agricultural consultant (2 wheels);Cane - single point;BSC/3in1;Tub bench;Grab bars - tub/shower      Prior Function Prior Level of Function : Independent/Modified Independent;Driving             Mobility Comments: household and short distanced community ambulator using SPC most of time, occasional use of RW ADLs Comments: Independent     Hand Dominance   Dominant Hand: Right    Extremity/Trunk Assessment   Upper Extremity Assessment Upper Extremity Assessment: Generalized weakness    Lower Extremity Assessment Lower Extremity Assessment: Generalized weakness    Cervical / Trunk Assessment Cervical / Trunk Assessment: Normal  Communication   Communication: No difficulties  Cognition Arousal/Alertness: Awake/alert Behavior During Therapy: WFL for tasks assessed/performed Overall Cognitive Status: Within Functional Limits for tasks assessed                                           General Comments      Exercises     Assessment/Plan    PT Assessment Patient needs continued PT services  PT Problem List Decreased strength;Decreased activity tolerance;Decreased balance;Decreased mobility       PT Treatment Interventions DME instruction;Gait training;Stair training;Functional mobility training;Therapeutic activities;Therapeutic exercise;Patient/family education;Balance training    PT Goals (Current goals can be found in the Care Plan section)  Acute Rehab PT Goals Patient Stated Goal: return home after rehab PT Goal Formulation: With patient Time For Goal Achievement: 02/09/23 Potential to Achieve Goals: Good    Frequency Min 3X/week     Co-evaluation               AM-PAC PT "6 Clicks" Mobility  Outcome Measure Help needed turning from your back to your side while in a flat bed without using bedrails?: A Little Help needed moving from lying on your back to sitting on the side of a flat bed without using bedrails?: A Little Help needed moving to and from a bed to a chair (including a wheelchair)?: A Lot Help needed standing up from a chair using your arms (e.g., wheelchair or bedside chair)?: A Lot Help needed to walk in hospital room?: A Lot Help needed climbing 3-5 steps with a railing? :  A Lot 6 Click Score: 14    End of Session   Activity Tolerance: Patient tolerated treatment well;Patient limited by fatigue Patient left: in chair;with call bell/phone within reach Nurse Communication: Mobility status PT Visit Diagnosis: Unsteadiness on feet (R26.81);Other abnormalities of gait and mobility (R26.89);Muscle weakness (generalized) (M62.81)    Time: 2952-8413 PT Time Calculation (min) (ACUTE ONLY): 21 min   Charges:   PT Evaluation $PT Eval Moderate Complexity: 1 Mod PT Treatments $Therapeutic Activity: 8-22 mins        12:16 PM, 01/26/23 [name], MPT Physical  Therapist with [facility] [phone] office [phone] mobile phone

## 2023-01-26 NOTE — Progress Notes (Signed)
Pt has no c/o pain. Pt A&O and sat in chair most of day. Assisted pt to [facility] with another nurse. Pt has adequate urine output. VS stable. No BM this shift. No complaints or concerns at this time.

## 2023-01-26 NOTE — [place] FL2 (Signed)
[place] MEDICAID FL2 LEVEL OF CARE FORM     IDENTIFICATION  Patient Name: Kathy [name] Birthdate: Feb 01, 1941 Sex: female Admission Date (Current Location): 01/23/2023  [facility] and [place] Number:  [employer] and Address:  [facility],  [address] [place] [address]      Provider Number: [id number]  Attending Physician Name and Address:  Erick Blinks, DO  Relative Name and Phone Number:       Current Level of Care: Hospital Recommended Level of Care: Skilled Nursing Facility Prior Approval Number:    Date Approved/Denied:   PASRR Number: [id number]  Discharge Plan: SNF    Current Diagnoses: Patient Active Problem List   Diagnosis Date Noted   Hypercalcemia 01/23/2023   AKI (acute kidney injury) (HCC) 01/23/2023   Impacted cerumen of right ear 10/06/2022   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   Hammer toe of left foot 09/15/2022   Left foot pain 09/15/2022   Iron deficiency anemia due to chronic blood loss 07/19/2022   Rectal bleeding 01/21/2022   Gastrojejunal ulcer with hemorrhage    Encounter for general adult medical examination with abnormal findings 09/16/2021   Hypokalemia 09/16/2021   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   Leg edema 03/16/2021   Malignant tumor of thyroid gland (HCC) 10/08/2020   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   Polyarthritis    Bariatric surgery status    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/2017   Rheumatoid arthritis (HCC) 11/16/2016   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   Rotator cuff arthropathy, 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   S/P shoulder replacement 05/11/2016   Allergic rhinitis 10/19/2013   GERD (gastroesophageal reflux disease) 10/19/2013   Osteoporosis    Hypertension    Hypothyroid    Hyperlipidemia    Anxiety    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 See d/c summary for updates.)  AMBULATORY STATUS COMMUNICATION OF NEEDS Skin   Extensive Assist Verbally Normal                       Personal Care Assistance Level of Assistance  Bathing, Feeding, Dressing Bathing Assistance: Maximum assistance Feeding assistance: Limited assistance Dressing Assistance: Maximum assistance     Functional Limitations Info  Sight, Hearing, Speech Sight Info: Adequate Hearing Info: Adequate Speech Info: Adequate    SPECIAL CARE FACTORS FREQUENCY  PT (By licensed PT)     PT Frequency: 5x weekly              Contractures      Additional Factors Info  Code Status, Allergies, Psychotropic Code Status Info: Full code Allergies Info: Asa (aspirin), Macrobid (nitrofurantoin) Psychotropic Info: Xanax         Current Medications (01/26/2023):  This is the current hospital active medication list Current Facility-Administered Medications  Medication Dose Route Frequency Provider Last Rate Last Admin   0.9 %  sodium chloride infusion   Intravenous Continuous Johnson, Clanford L, MD 100 mL/hr at 01/26/23 0141 Infusion Verify at 01/26/23 0141   acetaminophen (TYLENOL) tablet 650 mg  650 mg Oral Q6H PRN Emokpae, Ejiroghene E, MD   650 mg at  01/24/23 0830   Or   acetaminophen (TYLENOL) suppository 650 mg  650 mg Rectal Q6H PRN Emokpae, Ejiroghene E, MD       ALPRAZolam Prudy Feeler) tablet 0.25 mg  0.25 mg Oral BID PRN Laural Benes, Clanford L, MD   0.25 mg at 01/24/23 2210   amLODipine (NORVASC) tablet 5 mg  5 mg Oral Daily Johnson, Clanford L, MD   5 mg at 01/26/23 1008   hydroxychloroquine (PLAQUENIL) tablet 200 mg  200 mg Oral Daily Johnson, Clanford L, MD   200 mg at 01/26/23 1008   levothyroxine (SYNTHROID) tablet 88 mcg  88 mcg Oral  QAC breakfast Emokpae, Ejiroghene E, MD   88 mcg at 01/26/23 0549   magnesium sulfate IVPB 4 g 100 mL  4 g Intravenous Once Maurilio Lovely D, DO 50 mL/hr at 01/26/23 1011 4 g at 01/26/23 1011   ondansetron (ZOFRAN) tablet 4 mg  4 mg Oral Q6H PRN Emokpae, Ejiroghene E, MD       Or   ondansetron (ZOFRAN) injection 4 mg  4 mg Intravenous Q6H PRN Emokpae, Ejiroghene E, MD       pantoprazole (PROTONIX) EC tablet 40 mg  40 mg Oral BID Sherryll Burger, Pratik D, DO   40 mg at 01/26/23 1008   polyethylene glycol (MIRALAX / GLYCOLAX) packet 17 g  17 g Oral Daily PRN Emokpae, Ejiroghene E, MD       potassium chloride SA (KLOR-CON M) CR tablet 40 mEq  40 mEq Oral BID Sherryll Burger, Pratik D, DO   40 mEq at 01/26/23 1008     Discharge Medications: Please see discharge summary for a list of discharge medications.  Relevant Imaging Results:  Relevant Lab Results:   Additional Information SSN: [id number]  Karn Cassis, LCSW

## 2023-01-26 NOTE — Progress Notes (Signed)
Patient carried on several conversations with this RN. She still remains week but has improved some. No acute events overnight. [place] RN

## 2023-01-26 NOTE — TOC Initial Note (Signed)
Transition of Care [facility]) - Initial/Assessment Note    Patient Details  Name: Kathy [name] MRN: [id number] Date of Birth: Mar 05, 1941  Transition of [facility]) CM/SW Contact:    Karn Cassis, LCSW Phone Number: 01/26/2023, 11:29 AM  Clinical Narrative:  Pt admitted due to hypercalcemia. Pt lives alone. PT evaluated pt and recommend SNF. Pt states she has been to [facility] in the past, but wants to go to a different facility. Reviewed Medicare.gov ratings. Pt requests [facility], [facility], or [facility]. LCSW will initiate bed search. Will start authorization when appropriate.                  Expected Discharge Plan: Skilled Nursing Facility Barriers to Discharge: Continued Medical Work up   Patient Goals and CMS Choice Patient states their goals for this hospitalization and ongoing recovery are:: short term rehab   Choice offered to / list presented to : Patient [place] ownership interest in [facility].provided to:: Patient    Expected Discharge Plan and Services In-house Referral: Clinical Social Work   Post Acute Care Choice: Skilled Nursing Facility Living arrangements for the past 2 months: Single Family Home                                      Prior Living Arrangements/Services Living arrangements for the past 2 months: Single Family Home Lives with:: Self Patient language and need for interpreter reviewed:: Yes Do you feel safe going back to the place where you live?: Yes      Need for Family Participation in Patient Care: No (Comment)   Current home services: DME (cane, walker) Criminal Activity/Legal Involvement Pertinent to Current Situation/Hospitalization: No - Comment as needed  Activities of Daily Living Home Assistive Devices/Equipment: Walker (specify type) ADL Screening (condition at time of admission) Patient's cognitive ability adequate to safely complete daily activities?: Yes Is the patient deaf or have difficulty  hearing?: No Does the patient have difficulty seeing, even when wearing glasses/contacts?: No Does the patient have difficulty concentrating, remembering, or making decisions?: No Patient able to express need for assistance with ADLs?: Yes Does the patient have difficulty dressing or bathing?: No Independently performs ADLs?: Yes (appropriate for developmental age) Does the patient have difficulty walking or climbing stairs?: Yes Weakness of Legs: Both Weakness of Arms/Hands: Both  Permission Sought/Granted                  Emotional Assessment     Affect (typically observed): Appropriate Orientation: : Oriented to Self, Oriented to Place, Oriented to  Time, Oriented to Situation Alcohol / Substance Use: Not Applicable Psych Involvement: No (comment)  Admission diagnosis:  Hypercalcemia [E83.52] Weakness [R53.1] AKI (acute kidney injury) (HCC) [N17.9] Patient Active Problem List   Diagnosis Date Noted   Hypercalcemia 01/23/2023   AKI (acute kidney injury) (HCC) 01/23/2023   Impacted cerumen of right ear 10/06/2022   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   Hammer toe of left foot 09/15/2022   Left foot pain 09/15/2022   Iron deficiency anemia due to chronic blood loss 07/19/2022   Rectal bleeding 01/21/2022   Gastrojejunal ulcer with hemorrhage    Encounter for general adult medical examination with abnormal findings 09/16/2021   Hypokalemia 09/16/2021   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   Leg edema 03/16/2021   Malignant tumor of  thyroid gland (HCC) 10/08/2020   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   Polyarthritis    Bariatric surgery status    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/2017   Rheumatoid arthritis (HCC) 11/16/2016   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   Rotator cuff arthropathy, 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   S/P shoulder replacement 05/11/2016   Allergic  rhinitis 10/19/2013   GERD (gastroesophageal reflux disease) 10/19/2013   Osteoporosis    Hypertension    Hypothyroid    Hyperlipidemia    Anxiety    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 [place] [address] Phone: [phone] Fax: [phone]     Social Determinants of Health (SDOH) Social History: SDOH Screenings   Food Insecurity: No Food Insecurity (01/23/2023)  Housing: Low Risk  (01/23/2023)  Transportation Needs: No Transportation Needs (01/23/2023)  Utilities: Not At Risk (01/23/2023)  Alcohol Screen: Low Risk  (09/14/2021)  Depression (PHQ2-9): Low Risk  (11/23/2022)  Financial Resource Strain: Low Risk  (09/14/2021)  Physical Activity: Sufficiently Active (09/14/2021)  Social Connections: Moderately Isolated (09/14/2021)  Stress: No Stress Concern Present (09/14/2021)  Tobacco Use: Low Risk  (01/23/2023)   SDOH Interventions:     Readmission Risk Interventions     No data to display

## 2023-01-26 NOTE — Progress Notes (Signed)
PROGRESS NOTE    [place] Paone Weinman  UJW:[id number] DOB: 1941/03/23 DOA: 01/23/2023 PCP: [name], MD   Brief Narrative:    82 y.o. female with medical history significant for thyroid cancer, hypertension, RBBB, rheumatoid arthritis. Patient presented to the ED with complaints of generalized weakness for about 5 days duration.  She was admitted for symptomatic hypercalcemia that appears to be related to excessive intake of calcium supplementation at home.  Hypercalcemia is improving and she was also noted to have AKI which is now showing signs of improvement.  She is noted to have a downward trend in hemoglobin levels.  Assessment & Plan:   Principal Problem:   Hypercalcemia Active Problems:   Hypertension   Hypothyroid   Anxiety   Rheumatoid arthritis (HCC)   Malignant tumor of thyroid gland (HCC)   Leg edema   AKI (acute kidney injury) (HCC)  Assessment and Plan:  Hypercalcemia - symptomatic-improving Symptomatic hypercalcemia due to excess supplementation Calcium quite elevated at > 15 on  presentation. Ca++ was bordering on low just 6 months ago. Presenting with generalized weakness, poor oral intake, mild lethargy.  She is on vitamin D, and calcium supplements daily, she tells me it was started 3 years ago and she has been on the same dose.  No change in medications.  Per med list she is also on denosumab.  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  At this time, no obvious malignancy identified.  - Differentials include vitamin D intoxication, new cancer or recurrence of cancer. - follow up PTH intact - 5 (suppressed) - Vit D levels- 25, 1, 25 levels: 58.88, 15.3  - continue hydration with IV NS  - continue Calcitonin x 4 doses  - s/p 1 dose of 4mg  zoledronic acid for long-term control of hypercalcemia.  -Hold home vitamin D and calcium supplements  -EKG with some diffuse ST abnormalities, likely 2/ from  Hypercalcemia, no chest pain, no dyspnea, cgeck troponin x 2, repeat EKG in a.m. - repeat EKG in AM (this was not done) requested again   AKI (acute kidney injury)-improving Cr elevated 2.52, baseline 0.8.  In the setting of hypocalcemia, and poor oral intake.  No history at this time to suggest urinary outlet obstruction.   - Hydrate -check renal US : No collecting system dilatation. Left kidney is partially obscured by overlapping bowel gas and soft tissue. -Hold home as needed Lasix 20 mg  Hypomagnesemia/hypokalemia -Replete and reevaluate in a.m.  Worsening anemia -Anemia panel without any acute abnormalities -Check stool FOBT -Stop heparin and placed on SCDs -PPI twice daily for now -Continue to monitor closely and transfuse for hemoglobin less than 7   Leg edema No swelling at this time.  Chest x-ray showed mild central pulmonary vascular congestion.  BNP elevated at 848, no prior to compare.  Last echo 03/2022 EF of 60 to 65%.  Follows with Dr. Wyline Mood. -IV fluids for now with significant hypercalcemia -Consider resumption of furosemide if appropriate    Rheumatoid arthritis  - with AKI, temporarily reduced dose of hydroxychloroquine by 50%   Anxiety Resumed low dose alprazolam as needed only given mild confusion   Hypothyroid Status post total thyroidectomy -Resumed home levothyroxine   Hypertension - resumed amlodipine with acceptable control      DVT prophylaxis: [place] heparin to SCDs Code Status: Full  Family Communication: telephone call to brother 6/26   Consultants:    Procedures:    Antimicrobials:  None   Subjective: Patient seen and evaluated today with ongoing weakness noted.  PT recommending SNF.  She denies any overt bleeding or other symptoms.  Objective: Vitals:   01/25/23 1308 01/25/23 1900 01/26/23 0310 01/26/23 1006  BP: 135/71 139/72 130/68 130/76  Pulse: 91 89 78 86  Resp: 16 16 16  (!) 24  Temp: 98.6 F (37 C) 99.1 F (37.3 C) 99 F  (37.2 C) 98.2 F (36.8 C)  TempSrc: Oral Oral Oral Oral  SpO2: 94% 95% 96% 100%  Weight:      Height:        Intake/Output Summary (Last 24 hours) at 01/26/2023 1324 Last data filed at 01/26/2023 0900 Gross per 24 hour  Intake 2589.16 ml  Output --  Net 2589.16 ml   Filed Weights   01/23/23 1121 01/23/23 1640  Weight: 69.9 kg 70.4 kg    Examination:  General exam: Appears calm and comfortable  Respiratory system: Clear to auscultation. Respiratory effort normal. Cardiovascular system: S1 & S2 heard, RRR.  Gastrointestinal system: Abdomen is soft Central nervous system: Alert and awake Extremities: No edema Skin: No significant lesions noted Psychiatry: Flat affect.    Data Reviewed: I have personally reviewed following labs and imaging studies  CBC: Recent Labs  Lab 01/23/23 1207 01/23/23 1403 01/24/23 0442 01/26/23 0409 01/26/23 1058  WBC 8.0  --  8.4 6.8  --   NEUTROABS 5.7  --   --   --   --   HGB 10.1* 11.2* 8.7* 7.5* 8.0*  HCT 30.3* 33.0* 25.8* 23.1* 24.5*  MCV 99.3  --  98.5 101.3*  --   PLT 211  --  179 192  --    Basic Metabolic Panel: Recent Labs  Lab 01/23/23 1207 01/23/23 1403 01/24/23 0442 01/25/23 0829 01/26/23 0409  NA 136 140 136 138 140  K 3.5 3.5 3.1* 3.2* 3.3*  CL 97* 102 101 104 108  CO2 29  --  27 23 23   GLUCOSE 106* 81 90 91 87  BUN 54* 57* 51* 42* 31*  CREATININE 2.52* 2.90* 2.33* 2.29* 1.94*  CALCIUM >15.0*  --  14.0* 11.6* 10.4*  MG 1.5*  --   --   --  1.2*   GFR: Estimated Creatinine Clearance: 20.7 mL/min (A) (by C-G formula based on SCr of 1.94 mg/dL (H)). Liver Function Tests: Recent Labs  Lab 01/23/23 1207 01/25/23 0829 01/26/23 0409  AST 104*  --   --   ALT 53*  --   --   ALKPHOS 52  --   --   BILITOT 0.6  --   --   PROT 9.6*  --   --   ALBUMIN 2.3* 2.1* 1.8*   Recent Labs  Lab 01/23/23 1207  LIPASE 167*   No results for input(s): "AMMONIA" in the last 168 hours. Coagulation Profile: Recent Labs   Lab 01/23/23 1400  INR 1.1   Cardiac Enzymes: No results for input(s): "CKTOTAL", "CKMB", "CKMBINDEX", "TROPONINI" in the last 168 hours. BNP (last 3 results) No results for input(s): "PROBNP" in the last 8760 hours. HbA1C: No results for input(s): "HGBA1C" in the last 72 hours. CBG: No results for input(s): "GLUCAP" in the last 168 hours. Lipid Profile: No results for input(s): "CHOL", "HDL", "LDLCALC", "TRIG", "CHOLHDL", "LDLDIRECT" in the last 72 hours. Thyroid Function Tests: No results for input(s): "TSH", "T4TOTAL", "FREET4", "T3FREE", "THYROIDAB" in the last 72 hours. Anemia Panel: Recent Labs    01/26/23 0409 01/26/23 0800  VITAMINB12  1,348*  --   FOLATE  --  9.1  FERRITIN 101  --   TIBC 184*  --   IRON 67  --   RETICCTPCT 1.4  --    Sepsis Labs: Recent Labs  Lab 01/23/23 1321 01/23/23 1501  LATICACIDVEN 1.2 1.1    Recent Results (from the past 240 hour(s))  Blood Culture (routine x 2)     Status: None (Preliminary result)   Collection Time: 01/23/23 12:33 PM   Specimen: Left Antecubital; Blood  Result Value Ref Range Status   Specimen Description   Final    LEFT ANTECUBITAL BOTTLES DRAWN AEROBIC AND ANAEROBIC   Special Requests Blood Culture adequate volume  Final   Culture   Final    NO GROWTH 2 DAYS Performed at [facility], [address], [place], [place] [address]    Report Status PENDING  Incomplete  Blood Culture (routine x 2)     Status: None (Preliminary result)   Collection Time: 01/23/23 12:38 PM   Specimen: Right Antecubital; Blood  Result Value Ref Range Status   Specimen Description   Final    RIGHT ANTECUBITAL BOTTLES DRAWN AEROBIC AND ANAEROBIC   Special Requests   Final    Blood Culture results may not be optimal due to an inadequate volume of blood received in culture bottles   Culture   Final    NO GROWTH 2 DAYS Performed at [facility], [address] [place], [place] [address]    Report Status PENDING  Incomplete  SARS  Coronavirus 2 by RT PCR (hospital order, performed in [facility] hospital lab) *cepheid single result test* Anterior Nasal Swab     Status: None   Collection Time: 01/23/23  4:20 PM   Specimen: Anterior Nasal Swab  Result Value Ref Range Status   SARS Coronavirus 2 by RT PCR NEGATIVE NEGATIVE Final    Comment: (NOTE) SARS-CoV-2 target nucleic acids are NOT DETECTED.  The SARS-CoV-2 RNA is generally detectable in upper and lower respiratory specimens during the acute phase of infection. The lowest concentration of SARS-CoV-2 viral copies this assay can detect is 250 copies / mL. A negative result does not preclude SARS-CoV-2 infection and should not be used as the sole basis for treatment or other patient management decisions.  A negative result may occur with improper specimen collection / handling, submission of specimen other than nasopharyngeal swab, presence of viral mutation(s) within the areas targeted by this assay, and inadequate number of viral copies (<250 copies / mL). A negative result must be combined with clinical observations, patient history, and epidemiological information.  Fact Sheet for Patients:   RoadLapTop.co.za  Fact Sheet for Healthcare Providers: http://kim-miller.com/  This test is not yet approved or  cleared by the [place] FDA and has been authorized for detection and/or diagnosis of SARS-CoV-2 by FDA under an Emergency Use Authorization (EUA).  This EUA will remain in effect (meaning this test can be used) for the duration of the COVID-19 declaration under Section 564(b)(1) of the Act, 21 U.S.C. section 360bbb-3(b)(1), unless the authorization is terminated or revoked sooner.  Performed at [facility], [address], [place], [place] [address]          Radiology Studies: US RENAL  Result Date: 01/25/2023 CLINICAL DATA:  Acute kidney insufficiency EXAM: RENAL / URINARY TRACT ULTRASOUND  COMPLETE COMPARISON:  None Available. FINDINGS: Right Kidney: Renal measurements: 10.6 x 3.6 x 5.2 cm = volume: 103.8 mL. Echogenicity within normal limits. No  mass or hydronephrosis visualized. Left Kidney: Renal measurements: 9.1 x 4.7 x 5.5 cm = volume: No collecting system dilatation or perinephric fluid although significant portions are obscured by overlapping bowel gas and soft tissue. ML. Echogenicity within normal limits. No mass or hydronephrosis visualized. Bladder: Appears normal for degree of bladder distention. Other: None. IMPRESSION: No collecting system dilatation. Left kidney is partially obscured by overlapping bowel gas and soft tissue. Electronically Signed   By: Karen Kays M.D.   On: 01/25/2023 13:10   CT HEAD WO CONTRAST ( )  Result Date: 01/24/2023 CLINICAL DATA:  Headache. EXAM: CT HEAD WITHOUT CONTRAST TECHNIQUE: Contiguous axial images were obtained from the base of the skull through the vertex without intravenous contrast. RADIATION DOSE REDUCTION: This exam was performed according to the departmental dose-optimization program which includes automated exposure control, adjustment of the mA and/or kV according to patient size and/or use of iterative reconstruction technique. COMPARISON:  Head CT dated 06/09/2017. FINDINGS: Brain: Mild age-related atrophy and chronic microvascular ischemic changes. There is no acute intracranial hemorrhage. No mass effect or midline shift. No extra-axial fluid collection. Vascular: No hyperdense vessel or unexpected calcification. Skull: Normal. Negative for fracture or focal lesion. Sinuses/Orbits: No acute finding. Other: None IMPRESSION: 1. No acute intracranial pathology. 2. Mild age-related atrophy and chronic microvascular ischemic changes. Electronically Signed   By: Elgie Collard M.D.   On: 01/24/2023 19:46        Scheduled Meds:  amLODipine  5 mg Oral Daily   hydroxychloroquine  200 mg Oral Daily   levothyroxine  88 mcg Oral QAC  breakfast   pantoprazole  40 mg Oral BID   potassium chloride  40 mEq Oral BID   Continuous Infusions:  sodium chloride 100 mL/hr at 01/26/23 0141     LOS: 3 days    Time spent: 35 minutes    Naziya Hegwood Hoover Brunette, DO [facility]  If 7PM-7AM, please contact night-coverage [email or web address] 01/26/2023, 1:24 PM

## 2023-01-27 LAB — COMPREHENSIVE METABOLIC PANEL
ALT: 33 U/L (ref 0–44)
AST: 23 U/L (ref 15–41)
Albumin: 1.7 g/dL — ABNORMAL LOW (ref 3.5–5.0)
Alkaline Phosphatase: 41 U/L (ref 38–126)
Anion gap: 9 (ref 5–15)
BUN: 24 mg/dL — ABNORMAL HIGH (ref 8–23)
CO2: 20 mmol/L — ABNORMAL LOW (ref 22–32)
Calcium: 9.1 mg/dL (ref 8.9–10.3)
Chloride: 107 mmol/L (ref 98–111)
Creatinine, Ser: 1.78 mg/dL — ABNORMAL HIGH (ref 0.44–1.00)
GFR, Estimated: 29 mL/min — ABNORMAL LOW (ref >60)
Glucose, Bld: 96 mg/dL (ref 70–99)
Potassium: 3.4 mmol/L — ABNORMAL LOW (ref 3.5–5.1)
Sodium: 136 mmol/L (ref 135–145)
Total Bilirubin: 0.3 mg/dL (ref 0.3–1.2)
Total Protein: 7.2 g/dL (ref 6.5–8.1)

## 2023-01-27 LAB — MAGNESIUM: Magnesium: 1.7 mg/dL (ref 1.7–2.4)

## 2023-01-27 LAB — CBC
HCT: 21.8 % — ABNORMAL LOW (ref 36.0–46.0)
Hemoglobin: 7.2 g/dL — ABNORMAL LOW (ref 12.0–15.0)
MCH: 33.5 pg (ref 26.0–34.0)
MCHC: 33 g/dL (ref 30.0–36.0)
MCV: 101.4 fL — ABNORMAL HIGH (ref 80.0–100.0)
Platelets: 171 10*3/uL (ref 150–400)
RBC: 2.15 MIL/uL — ABNORMAL LOW (ref 3.87–5.11)
RDW: 15.4 % (ref 11.5–15.5)
WBC: 6 10*3/uL (ref 4.0–10.5)
nRBC: 0 % (ref 0.0–0.2)

## 2023-01-27 LAB — CULTURE, BLOOD (ROUTINE X 2): Culture: NO GROWTH

## 2023-01-27 LAB — OCCULT BLOOD X 1 CARD TO LAB, STOOL: Fecal Occult Bld: NEGATIVE

## 2023-01-27 LAB — HEMOGLOBIN AND HEMATOCRIT, BLOOD
HCT: 25.5 % — ABNORMAL LOW (ref 36.0–46.0)
Hemoglobin: 8.2 g/dL — ABNORMAL LOW (ref 12.0–15.0)

## 2023-01-27 MED ORDER — MAGNESIUM OXIDE -MG SUPPLEMENT 400 (240 MG) MG PO TABS
400.0000 mg | ORAL_TABLET | Freq: Every day | ORAL | Status: DC
  Administered 2023-01-27: 400 mg via ORAL
  Filled 2023-01-27 (×2): qty 1

## 2023-01-27 MED ORDER — POTASSIUM CHLORIDE CRYS ER 20 MEQ PO TBCR
40.0000 meq | EXTENDED_RELEASE_TABLET | Freq: Two times a day (BID) | ORAL | Status: AC
  Administered 2023-01-27 (×2): 40 meq via ORAL
  Filled 2023-01-27 (×2): qty 2

## 2023-01-27 MED ORDER — MAGNESIUM SULFATE 2 GM/50ML IV SOLN
2.0000 g | Freq: Once | INTRAVENOUS | Status: DC
  Filled 2023-01-27: qty 50

## 2023-01-27 NOTE — Progress Notes (Signed)
Physical Therapy Treatment Patient Details Name: Kathy [name] MRN: [id number] DOB: [birth year] Today's Date: 01/27/2023   History of Present Illness Kathy [name] is a 82 y.o. female with medical history significant for thyroid cancer, hypertension, RBBB, rheumatoid arthritis.  Patient presented to the ED with complaints of generalized weakness for about 5 days duration.  She reports poor oral intake for the past 5 days, no vomiting no loose stools.  No chest pain no difficulty breathing.  No lower extremity swelling.  No problems with urination.  She takes vitamin D and calcium supplements daily over the past 3 years and has been no change in dose.  There has been no change to her medications as far she knows over the past 6 months.  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  Patient lives alone.    PT Comments    Patient presents in chair (assisted by nursing staff) and agreeable for therapy.  Patient demonstrates increased endurance/distance for gait training with slow labored cadence with fair/good return for advancing LLE without loss of balance and limited mostly due to c/o fatigue/generalized weakness.  Patient tolerated staying up in chair after therapy.  Patient will benefit from continued skilled physical therapy in hospital and recommended venue below to increase strength, balance, endurance for safe ADLs and gait.     Recommendations for follow up therapy are one component of a multi-disciplinary discharge planning process, led by the attending physician.  Recommendations may be updated based on patient status, additional functional criteria and insurance authorization.  Follow Up Recommendations  Can patient physically be transported by private vehicle: Yes    Assistance Recommended at Discharge Set up Supervision/Assistance  Patient can return home with the following A lot of help with  bathing/dressing/bathroom;A lot of help with walking and/or transfers;Help with stairs or ramp for entrance;Assistance with cooking/housework   Equipment Recommendations  None recommended by PT    Recommendations for Other Services       Precautions / Restrictions Precautions Precautions: Fall Restrictions Weight Bearing Restrictions: No     Mobility  Bed Mobility               General bed mobility comments: Patient presents up in chair (assisted by nursing staff)    Transfers Overall transfer level: Needs assistance Equipment used: Rolling walker (2 wheels) Transfers: Sit to/from Stand, Bed to chair/wheelchair/BSC Sit to Stand: Mod assist   Step pivot transfers: Min assist, Mod assist       General transfer comment: had difficulty completing sit to stands due to BLE weakness and limited left knee flexion    Ambulation/Gait Ambulation/Gait assistance: Min assist, Mod assist Gait Distance (Feet): 33 Feet Assistive device: Rolling walker (2 wheels) Gait Pattern/deviations: Decreased step length - left, Decreased stride length, Decreased step length - right, Decreased stance time - left Gait velocity: decreased     General Gait Details: increased endurance/distance for ambulation with slow labored cadence, decreased left knee flexion and limited mostly due to c/o fatigue   Stairs             Wheelchair Mobility    Modified Rankin (Stroke Patients Only)       Balance Overall balance assessment: Needs assistance Sitting-balance support: Feet supported, No upper extremity supported Sitting balance-Leahy Scale: Fair Sitting balance - Comments: fair/good seated at edge of chair   Standing balance support: During functional activity, Bilateral upper extremity supported Standing  balance-Leahy Scale: Fair Standing balance comment: using RW                            Cognition Arousal/Alertness: Awake/alert Behavior During Therapy: WFL for  tasks assessed/performed Overall Cognitive Status: Within Functional Limits for tasks assessed                                          Exercises General Exercises - Lower Extremity Long Arc Quad: Seated, AROM, Strengthening, Both, 10 reps Hip Flexion/Marching: Seated, AROM, Strengthening, Both, 10 reps Toe Raises: Seated, AROM, Strengthening, Both, 10 reps Heel Raises: Seated, AROM, Strengthening, Both, 10 reps    General Comments        Pertinent Vitals/Pain Pain Assessment Pain Assessment: No/denies pain    Home Living                          Prior Function            PT Goals (current goals can now be found in the care plan section) Acute Rehab PT Goals Patient Stated Goal: return home after rehab PT Goal Formulation: With patient Time For Goal Achievement: 02/09/23 Potential to Achieve Goals: Good Progress towards PT goals: Progressing toward goals    Frequency    Min 3X/week      PT Plan Current plan remains appropriate    Co-evaluation              AM-PAC PT "6 Clicks" Mobility   Outcome Measure  Help needed turning from your back to your side while in a flat bed without using bedrails?: A Little Help needed moving from lying on your back to sitting on the side of a flat bed without using bedrails?: A Little Help needed moving to and from a bed to a chair (including a wheelchair)?: A Lot Help needed standing up from a chair using your arms (e.g., wheelchair or bedside chair)?: A Lot Help needed to walk in hospital room?: A Lot Help needed climbing 3-5 steps with a railing? : A Lot 6 Click Score: 14    End of Session   Activity Tolerance: Patient tolerated treatment well;Patient limited by fatigue Patient left: in chair;with call bell/phone within reach;with family/visitor present Nurse Communication: Mobility status PT Visit Diagnosis: Unsteadiness on feet (R26.81);Other abnormalities of gait and mobility  (R26.89);Muscle weakness (generalized) (M62.81)     Time: 1191-4782 PT Time Calculation (min) (ACUTE ONLY): 23 min  Charges:  $Gait Training: 8-22 mins $Therapeutic Exercise: 8-22 mins                     3:38 PM, 01/27/23 [name], MPT Physical Therapist with [facility] [phone] office [phone] mobile phone

## 2023-01-27 NOTE — TOC Progression Note (Signed)
Transition of Care [facility]) - Progression Note    Patient Details  Name: Kathy [name] MRN: [id number] Date of Birth: 1941/06/16  Transition of Care [employer] [facility]) CM/SW Contact  Karn Cassis, [place] Phone Number: 01/27/2023, 9:59 AM  Clinical Narrative: LCSW provided bed offers to pt who chooses [facility]. Facility updated. Possible d/c tomorrow per MD. CMA will start SNF authorization.       Expected Discharge Plan: Skilled Nursing Facility Barriers to Discharge: Continued Medical Work up  Expected Discharge Plan and Services In-house Referral: Clinical Social Work   Post Acute Care Choice: Skilled Nursing Facility Living arrangements for the past 2 months: Single Family Home                                       Social Determinants of Health (SDOH) Interventions SDOH Screenings   Food Insecurity: No Food Insecurity (01/23/2023)  Housing: Low Risk  (01/23/2023)  Transportation Needs: No Transportation Needs (01/23/2023)  Utilities: Not At Risk (01/23/2023)  Alcohol Screen: Low Risk  (09/14/2021)  Depression (PHQ2-9): Low Risk  (11/23/2022)  Financial Resource Strain: Low Risk  (09/14/2021)  Physical Activity: Sufficiently Active (09/14/2021)  Social Connections: Moderately Isolated (09/14/2021)  Stress: No Stress Concern Present (09/14/2021)  Tobacco Use: Low Risk  (01/23/2023)    Readmission Risk Interventions     No data to display

## 2023-01-27 NOTE — Progress Notes (Signed)
PROGRESS NOTE    Kathy [name]  ZOX:[id number] DOB: [birth year] DOA: 01/23/2023 PCP: [name], MD   Brief Narrative:    82 y.o. female with medical history significant for thyroid cancer, hypertension, RBBB, rheumatoid arthritis. Patient presented to the ED with complaints of generalized weakness for about 5 days duration.  She was admitted for symptomatic hypercalcemia that appears to be related to excessive intake of calcium supplementation at home.  Hypercalcemia is improving and she was also noted to have AKI which is now showing signs of improvement.  She is noted to have a downward trend in hemoglobin levels.  Stool with negative FOBT, plan to transfuse if further downtrending.  Awaiting SNF placement.  Assessment & Plan:   Principal Problem:   Hypercalcemia Active Problems:   Hypertension   Hypothyroid   Anxiety   Rheumatoid arthritis (HCC)   Malignant tumor of thyroid gland (HCC)   Leg edema   AKI (acute kidney injury) (HCC)  Assessment and Plan:  Hypercalcemia - symptomatic-improving Symptomatic hypercalcemia due to excess supplementation Calcium quite elevated at > 15 on  presentation. Ca++ was bordering on low just 6 months ago. Presenting with generalized weakness, poor oral intake, mild lethargy.  She is on vitamin D, and calcium supplements daily, she tells me it was started 3 years ago and she has been on the same dose.  No change in medications.  Per med list she is also on denosumab.  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  At this time, no obvious malignancy identified.  - Differentials include vitamin D intoxication, new cancer or recurrence of cancer. - follow up PTH intact - 5 (suppressed) - Vit D levels- 25, 1, 25 levels: 58.88, 15.3  - continue hydration with IV NS  - continue Calcitonin x 4 doses  - s/p 1 dose of 4mg  zoledronic acid for long-term control of hypercalcemia.  -Hold home  vitamin D and calcium supplements  -EKG with some diffuse ST abnormalities, likely 2/ from Hypercalcemia, no chest pain, no dyspnea, cgeck troponin x 2, repeat EKG in a.m. - repeat EKG in AM (this was not done) requested again   AKI (acute kidney injury)-improving Cr elevated 2.52, baseline 0.8.  In the setting of hypocalcemia, and poor oral intake.  No history at this time to suggest urinary outlet obstruction.   - Hydrate -check renal US : No collecting system dilatation. Left kidney is partially obscured by overlapping bowel gas and soft tissue. -Hold home as needed Lasix 20 mg  Hypomagnesemia/hypokalemia -Replete and reevaluate in a.m.  Worsening anemia -Anemia panel without any acute abnormalities -Stool FOBT negative -Stop heparin and placed on SCDs -PPI twice daily for now -Continue to monitor closely and transfuse for hemoglobin less than 7   Leg edema No swelling at this time.  Chest x-ray showed mild central pulmonary vascular congestion.  BNP elevated at 848, no prior to compare.  Last echo 03/2022 EF of 60 to 65%.  Follows with Dr. Wyline Mood. -IV fluids for now with significant hypercalcemia -Consider resumption of furosemide if appropriate    Rheumatoid arthritis  - with AKI, temporarily reduced dose of hydroxychloroquine by 50%   Anxiety Resumed low dose alprazolam as needed only given mild confusion   Hypothyroid Status post total thyroidectomy -Resumed home levothyroxine   Hypertension - resumed amlodipine with acceptable control      DVT prophylaxis: Ages heparin to SCDs Code Status: Full  Family Communication: telephone  call to brother 6/27   Consultants:    Procedures:    Antimicrobials:  None   Subjective: Patient seen and evaluated today with ongoing weakness noted.  PT recommending SNF.  She denies any overt bleeding or other symptoms.  She is feeling less weak today.  Objective: Vitals:   01/26/23 1006 01/26/23 1425 01/26/23 1946 01/27/23 0325   BP: 130/76 129/75 115/61 (!) 107/52  Pulse: 86 85 77 82  Resp: (!) 24 19 20  (!) 24  Temp: 98.2 F (36.8 C) 98.4 F (36.9 C)  98.7 F (37.1 C)  TempSrc: Oral Oral  Oral  SpO2: 100% 100% 100% 100%  Weight:      Height:        Intake/Output Summary (Last 24 hours) at 01/27/2023 1243 Last data filed at 01/27/2023 0900 Gross per 24 hour  Intake 2196.73 ml  Output 1000 ml  Net 1196.73 ml   Filed Weights   01/23/23 1121 01/23/23 1640  Weight: 69.9 kg 70.4 kg    Examination:  General exam: Appears calm and comfortable  Respiratory system: Clear to auscultation. Respiratory effort normal. Cardiovascular system: S1 & S2 heard, RRR.  Gastrointestinal system: Abdomen is soft Central nervous system: Alert and awake Extremities: No edema Skin: No significant lesions noted Psychiatry: Flat affect.    Data Reviewed: I have personally reviewed following labs and imaging studies  CBC: Recent Labs  Lab 01/23/23 1207 01/23/23 1403 01/24/23 0442 01/26/23 0409 01/26/23 1058 01/27/23 0444  WBC 8.0  --  8.4 6.8  --  6.0  NEUTROABS 5.7  --   --   --   --   --   HGB 10.1* 11.2* 8.7* 7.5* 8.0* 7.2*  HCT 30.3* 33.0* 25.8* 23.1* 24.5* 21.8*  MCV 99.3  --  98.5 101.3*  --  101.4*  PLT 211  --  179 192  --  171   Basic Metabolic Panel: Recent Labs  Lab 01/23/23 1207 01/23/23 1403 01/24/23 0442 01/25/23 0829 01/26/23 0409 01/27/23 0444  NA 136 140 136 138 140 136  K 3.5 3.5 3.1* 3.2* 3.3* 3.4*  CL 97* 102 101 104 108 107  CO2 29  --  27 23 23  20*  GLUCOSE 106* 81 90 91 87 96  BUN 54* 57* 51* 42* 31* 24*  CREATININE 2.52* 2.90* 2.33* 2.29* 1.94* 1.78*  CALCIUM >15.0*  --  14.0* 11.6* 10.4* 9.1  MG 1.5*  --   --   --  1.2* 1.7   GFR: Estimated Creatinine Clearance: 22.6 mL/min (A) (by C-G formula based on SCr of 1.78 mg/dL (H)). Liver Function Tests: Recent Labs  Lab 01/23/23 1207 01/25/23 0829 01/26/23 0409 01/27/23 0444  AST 104*  --   --  23  ALT 53*  --   --  33   ALKPHOS 52  --   --  41  BILITOT 0.6  --   --  0.3  PROT 9.6*  --   --  7.2  ALBUMIN 2.3* 2.1* 1.8* 1.7*   Recent Labs  Lab 01/23/23 1207  LIPASE 167*   No results for input(s): "AMMONIA" in the last 168 hours. Coagulation Profile: Recent Labs  Lab 01/23/23 1400  INR 1.1   Cardiac Enzymes: No results for input(s): "CKTOTAL", "CKMB", "CKMBINDEX", "TROPONINI" in the last 168 hours. BNP (last 3 results) No results for input(s): "PROBNP" in the last 8760 hours. HbA1C: No results for input(s): "HGBA1C" in the last 72 hours. CBG: No results for input(s): "GLUCAP"  in the last 168 hours. Lipid Profile: No results for input(s): "CHOL", "HDL", "LDLCALC", "TRIG", "CHOLHDL", "LDLDIRECT" in the last 72 hours. Thyroid Function Tests: No results for input(s): "TSH", "T4TOTAL", "FREET4", "T3FREE", "THYROIDAB" in the last 72 hours. Anemia Panel: Recent Labs    01/26/23 0409 01/26/23 0800  VITAMINB12 1,348*  --   FOLATE  --  9.1  FERRITIN 101  --   TIBC 184*  --   IRON 67  --   RETICCTPCT 1.4  --    Sepsis Labs: Recent Labs  Lab 01/23/23 1321 01/23/23 1501  LATICACIDVEN 1.2 1.1    Recent Results (from the past 240 hour(s))  Blood Culture (routine x 2)     Status: None (Preliminary result)   Collection Time: 01/23/23 12:33 PM   Specimen: Left Antecubital; Blood  Result Value Ref Range Status   Specimen Description   Final    LEFT ANTECUBITAL BOTTLES DRAWN AEROBIC AND ANAEROBIC   Special Requests Blood Culture adequate volume  Final   Culture   Final    NO GROWTH 4 DAYS Performed at [facility], [address] [place], [place] [address]    Report Status PENDING  Incomplete  Blood Culture (routine x 2)     Status: None (Preliminary result)   Collection Time: 01/23/23 12:38 PM   Specimen: Right Antecubital; Blood  Result Value Ref Range Status   Specimen Description   Final    RIGHT ANTECUBITAL BOTTLES DRAWN AEROBIC AND ANAEROBIC   Special Requests   Final    Blood  Culture results may not be optimal due to an inadequate volume of blood received in culture bottles   Culture   Final    NO GROWTH 4 DAYS Performed at [facility], [address], [place], [place] [address]    Report Status PENDING  Incomplete  SARS Coronavirus 2 by RT PCR (hospital order, performed in [facility] hospital lab) *cepheid single result test* Anterior Nasal Swab     Status: None   Collection Time: 01/23/23  4:20 PM   Specimen: Anterior Nasal Swab  Result Value Ref Range Status   SARS Coronavirus 2 by RT PCR NEGATIVE NEGATIVE Final    Comment: (NOTE) SARS-CoV-2 target nucleic acids are NOT DETECTED.  The SARS-CoV-2 RNA is generally detectable in upper and lower respiratory specimens during the acute phase of infection. The lowest concentration of SARS-CoV-2 viral copies this assay can detect is 250 copies / mL. A negative result does not preclude SARS-CoV-2 infection and should not be used as the sole basis for treatment or other patient management decisions.  A negative result may occur with improper specimen collection / handling, submission of specimen other than nasopharyngeal swab, presence of viral mutation(s) within the areas targeted by this assay, and inadequate number of viral copies (<250 copies / mL). A negative result must be combined with clinical observations, patient history, and epidemiological information.  Fact Sheet for Patients:   RoadLapTop.co.za  Fact Sheet for Healthcare Providers: http://kim-miller.com/  This test is not yet approved or  cleared by the [place] FDA and has been authorized for detection and/or diagnosis of SARS-CoV-2 by FDA under an Emergency Use Authorization (EUA).  This EUA will remain in effect (meaning this test can be used) for the duration of the COVID-19 declaration under Section 564(b)(1) of the Act, 21 U.S.C. section 360bbb-3(b)(1), unless the authorization is  terminated or revoked sooner.  Performed at [facility], [address] [place], [place] [address]  Radiology Studies: No results found.      Scheduled Meds:  amLODipine  5 mg Oral Daily   hydroxychloroquine  200 mg Oral Daily   levothyroxine  88 mcg Oral QAC breakfast   magnesium oxide  400 mg Oral Daily   pantoprazole  40 mg Oral BID   potassium chloride  40 mEq Oral BID   Continuous Infusions:  magnesium sulfate bolus IVPB       LOS: 4 days    Time spent: 35 minutes    Kathy Bellino Hoover Brunette, DO [facility]  If 7PM-7AM, please contact night-coverage [email or web address] 01/27/2023, 12:43 PM

## 2023-01-28 ENCOUNTER — Other Ambulatory Visit (HOSPITAL_COMMUNITY): Payer: Self-pay

## 2023-01-28 LAB — BASIC METABOLIC PANEL
Anion gap: 8 (ref 5–15)
BUN: 19 mg/dL (ref 8–23)
CO2: 20 mmol/L — ABNORMAL LOW (ref 22–32)
Calcium: 8.5 mg/dL — ABNORMAL LOW (ref 8.9–10.3)
Chloride: 108 mmol/L (ref 98–111)
Creatinine, Ser: 1.67 mg/dL — ABNORMAL HIGH (ref 0.44–1.00)
GFR, Estimated: 31 mL/min — ABNORMAL LOW (ref >60)
Glucose, Bld: 85 mg/dL (ref 70–99)
Potassium: 3.7 mmol/L (ref 3.5–5.1)
Sodium: 136 mmol/L (ref 135–145)

## 2023-01-28 LAB — CBC
HCT: 23.4 % — ABNORMAL LOW (ref 36.0–46.0)
Hemoglobin: 7.5 g/dL — ABNORMAL LOW (ref 12.0–15.0)
MCH: 32.8 pg (ref 26.0–34.0)
MCHC: 32.1 g/dL (ref 30.0–36.0)
MCV: 102.2 fL — ABNORMAL HIGH (ref 80.0–100.0)
Platelets: 194 10*3/uL (ref 150–400)
RBC: 2.29 MIL/uL — ABNORMAL LOW (ref 3.87–5.11)
RDW: 15.6 % — ABNORMAL HIGH (ref 11.5–15.5)
WBC: 5.8 10*3/uL (ref 4.0–10.5)
nRBC: 0 % (ref 0.0–0.2)

## 2023-01-28 LAB — MAGNESIUM: Magnesium: 1.6 mg/dL — ABNORMAL LOW (ref 1.7–2.4)

## 2023-01-28 MED ORDER — MAGNESIUM OXIDE -MG SUPPLEMENT 400 (240 MG) MG PO TABS
400.0000 mg | ORAL_TABLET | Freq: Two times a day (BID) | ORAL | Status: DC
  Administered 2023-01-28 – 2023-01-31 (×7): 400 mg via ORAL
  Filled 2023-01-28 (×6): qty 1

## 2023-01-28 MED ORDER — MAGNESIUM OXIDE -MG SUPPLEMENT 400 (240 MG) MG PO TABS: 400.0000 mg | ORAL_TABLET | Freq: Two times a day (BID) | ORAL | 0 refills | Status: AC

## 2023-01-28 MED ORDER — OXYCODONE-ACETAMINOPHEN 5-325 MG PO TABS: 1.0000 | ORAL_TABLET | Freq: Three times a day (TID) | ORAL | 0 refills | Status: DC | PRN

## 2023-01-28 MED ORDER — TRAMADOL HCL 50 MG PO TABS: 50.0000 mg | ORAL_TABLET | Freq: Every day | ORAL | 0 refills | Status: DC | PRN

## 2023-01-28 MED ORDER — ALPRAZOLAM 0.25 MG PO TABS
0.2500 mg | ORAL_TABLET | Freq: Two times a day (BID) | ORAL | 0 refills | Status: DC | PRN
Start: 2023-01-28 — End: 2023-03-03

## 2023-01-28 NOTE — Progress Notes (Signed)
Patient slept on and off this shift, due to need to use the bedside commode, two assist needed to get patient up. Xanax given per request of patient for sleep. No complaints of pain. Continued to monitor patient.

## 2023-01-28 NOTE — Discharge Summary (Signed)
Physician Discharge Summary  Kathy [name] Bowery ZOX:[id number] DOB: 1941-01-05 DOA: 01/23/2023  PCP: [name], MD  Admit date: 01/23/2023  Discharge date: 01/29/2023  Admitted From:Home  Disposition:  SNF  Recommendations for Outpatient Follow-up:  Follow up with PCP in 1-2 weeks Please obtain BMP/CBC in one week Continue to follow calcium, magnesium, and potassium levels outpatient in 1 week Continue home medications as noted below Hold further supplementation of calcium and vitamin D for now until further follow-up and remain on magnesium supplementation as well as potassium supplementation  Home Health: None  Equipment/Devices: None  Discharge Condition:Stable  CODE STATUS: Full  Diet recommendation: Heart Healthy  Brief/Interim Summary: 82 y.o. female with medical history significant for thyroid cancer, hypertension, RBBB, rheumatoid arthritis. Patient presented to the ED with complaints of generalized weakness for about 5 days duration.  She was admitted for symptomatic hypercalcemia that appears to be related to excessive intake of calcium supplementation at home.  Hypercalcemia is improving and she was also noted to have AKI and both of these conditions have now improved.  She was noted to have some worsening anemia, but no overt bleeding noted and now this remains stable and she will likely need further outpatient follow-up.  She was seen by PT and is recommended for SNF placement which has not been arranged.  No other acute events or concerns noted and she is in stable condition for discharge.  Discharge Diagnoses:  Principal Problem:   Hypercalcemia Active Problems:   Hypertension   Hypothyroid   Anxiety   Rheumatoid arthritis (HCC)   Malignant tumor of thyroid gland (HCC)   Leg edema   AKI (acute kidney injury) (HCC)  Principal discharge diagnosis: Symptomatic hypercalcemia secondary to excess supplementation along with prerenal AKI.  Discharge  Instructions  Discharge Instructions     Diet - low sodium heart healthy   Complete by: As directed    Increase activity slowly   Complete by: As directed       Allergies as of 01/29/2023       Reactions   Asa [aspirin] Nausea And Vomiting, Other (See Comments)   GI intolerance Hx of stomach ulcers   Macrobid [nitrofurantoin] Other (See Comments)   Stomach pain        Medication List     TAKE these medications    acetaminophen 650 MG CR tablet Commonly known as: TYLENOL Take 650 mg by mouth at bedtime.   ALPRAZolam 0.25 MG tablet Commonly known as: XANAX Take 1 tablet (0.25 mg total) by mouth 2 (two) times daily as needed for anxiety or sleep.   amLODipine 5 MG tablet Commonly known as: NORVASC Take 1 tablet (5 mg total) by mouth daily.   diclofenac Sodium 1 % Gel Commonly known as: VOLTAREN APPLY (1) GRAM TO AFFECTED AREA DAILY. What changed:  how much to take how to take this when to take this additional instructions   ferrous sulfate 325 (65 FE) MG EC tablet Take 1 tablet (325 mg total) by mouth daily.   FISH OIL PO Take 1 capsule by mouth daily.   furosemide 20 MG tablet Commonly known as: LASIX Take 1 tablet (20 mg total) by mouth 2 (two) times daily. What changed:  when to take this reasons to take this   Humira (2 Pen) 40 MG/0.4ML pen Generic drug: adalimumab Inject 40 mg into the skin every Saturday.   hydroxychloroquine 200 MG tablet Commonly known as: PLAQUENIL Take 200 mg by mouth 2 (two) times  daily.   levothyroxine 88 MCG tablet Commonly known as: SYNTHROID Take 88 mcg by mouth daily before breakfast.   magnesium oxide 400 (240 Mg) MG tablet Commonly known as: MAG-OX Take 1 tablet (400 mg total) by mouth 2 (two) times daily.   multivitamin with minerals Tabs tablet Take 1 tablet by mouth every morning.   Narcan 4 MG/0.1ML Liqd nasal spray kit Generic drug: naloxone Place 1 spray into the nose as needed (opioid reversal).    omeprazole 40 MG capsule Commonly known as: PRILOSEC Take 1 capsule (40 mg total) by mouth daily before breakfast. Open capsule and swallow granules with liquid or applesauce   oxyCODONE-acetaminophen 5-325 MG tablet Commonly known as: PERCOCET/ROXICET Take 1 tablet by mouth every 8 (eight) hours as needed (pain not relieved by tramadol).   potassium chloride SA 20 MEQ tablet Commonly known as: KLOR-CON M TAKE 1 TABLET (20 MEQ) BY MOUTH DAILY PRN with Lasix (EDEMA) What changed:  how much to take how to take this when to take this reasons to take this additional instructions   Prolia 60 MG/ML Sosy injection Generic drug: denosumab inject 60mg  Subcutaneous every 6 months 180 days   traMADol 50 MG tablet Commonly known as: ULTRAM Take 1 tablet (50 mg total) by mouth daily as needed for moderate pain.        Contact information for follow-up providers     [name], MD. Schedule an appointment as soon as possible for a visit in 1 week(s).   Specialty: Internal Medicine Contact information: [address] [place] [address] [phone]              Contact information for after-discharge care     Destination     [facility] Preferred SNF .   Service: Skilled Nursing Contact information: [address] [place] [address] [phone]                    Allergies  Allergen Reactions   Jonne Ply [Aspirin] Nausea And Vomiting and Other (See Comments)    GI intolerance Hx of stomach ulcers   Macrobid [Nitrofurantoin] Other (See Comments)    Stomach pain    Consultations: None   Procedures/Studies: US RENAL  Result Date: 01/25/2023 CLINICAL DATA:  Acute kidney insufficiency EXAM: RENAL / URINARY TRACT ULTRASOUND COMPLETE COMPARISON:  None Available. FINDINGS: Right Kidney: Renal measurements: 10.6 x 3.6 x 5.2 cm = volume: 103.8 mL. Echogenicity within normal limits. No mass or hydronephrosis visualized.  Left Kidney: Renal measurements: 9.1 x 4.7 x 5.5 cm = volume: No collecting system dilatation or perinephric fluid although significant portions are obscured by overlapping bowel gas and soft tissue. ML. Echogenicity within normal limits. No mass or hydronephrosis visualized. Bladder: Appears normal for degree of bladder distention. Other: None. IMPRESSION: No collecting system dilatation. Left kidney is partially obscured by overlapping bowel gas and soft tissue. Electronically Signed   By: Karen Kays M.D.   On: 01/25/2023 13:10   CT HEAD WO CONTRAST ( )  Result Date: 01/24/2023 CLINICAL DATA:  Headache. EXAM: CT HEAD WITHOUT CONTRAST TECHNIQUE: Contiguous axial images were obtained from the base of the skull through the vertex without intravenous contrast. RADIATION DOSE REDUCTION: This exam was performed according to the departmental dose-optimization program which includes automated exposure control, adjustment of the mA and/or kV according to patient size and/or use of iterative reconstruction technique. COMPARISON:  Head CT dated 06/09/2017. FINDINGS: Brain: Mild age-related atrophy and  chronic microvascular ischemic changes. There is no acute intracranial hemorrhage. No mass effect or midline shift. No extra-axial fluid collection. Vascular: No hyperdense vessel or unexpected calcification. Skull: Normal. Negative for fracture or focal lesion. Sinuses/Orbits: No acute finding. Other: None IMPRESSION: 1. No acute intracranial pathology. 2. Mild age-related atrophy and chronic microvascular ischemic changes. Electronically Signed   By: Elgie Collard M.D.   On: 01/24/2023 19:46   DG Chest Port 1 View  Result Date: 01/23/2023 CLINICAL DATA:  Sepsis. EXAM: PORTABLE CHEST 1 VIEW COMPARISON:  September 26, 2013. FINDINGS: Mild cardiomegaly is noted with mild central pulmonary vascular congestion. No consolidative process is noted. Status post bilateral shoulder arthroplasties. IMPRESSION: Mild  cardiomegaly with mild central pulmonary vascular congestion. Electronically Signed   By: Lupita Raider M.D.   On: 01/23/2023 12:57     Discharge Exam: Vitals:   01/28/23 2005 01/29/23 0545  BP: (!) 114/55 123/79  Pulse: 84 74  Resp: 16 20  Temp: 97.9 F (36.6 C) 98.6 F (37 C)  SpO2: 100% 99%   Vitals:   01/28/23 0503 01/28/23 1354 01/28/23 2005 01/29/23 0545  BP: (!) 113/52 121/80 (!) 114/55 123/79  Pulse: 72 88 84 74  Resp: 20 18 16 20   Temp: 98.6 F (37 C) 98.7 F (37.1 C) 97.9 F (36.6 C) 98.6 F (37 C)  TempSrc: Oral Oral    SpO2: 97% 100% 100% 99%  Weight:      Height:        General: Pt is alert, awake, not in acute distress Cardiovascular: RRR, S1/S2 +, no rubs, no gallops Respiratory: CTA bilaterally, no wheezing, no rhonchi Abdominal: Soft, NT, ND, bowel sounds + Extremities: no edema, no cyanosis    The results of significant diagnostics from this hospitalization (including imaging, microbiology, ancillary and laboratory) are listed below for reference.     Microbiology: Recent Results (from the past 240 hour(s))  Blood Culture (routine x 2)     Status: None   Collection Time: 01/23/23 12:33 PM   Specimen: Left Antecubital; Blood  Result Value Ref Range Status   Specimen Description   Final    LEFT ANTECUBITAL BOTTLES DRAWN AEROBIC AND ANAEROBIC   Special Requests Blood Culture adequate volume  Final   Culture   Final    NO GROWTH 5 DAYS Performed at [facility], [address] [place], [place] [address]    Report Status 01/28/2023 FINAL  Final  Blood Culture (routine x 2)     Status: None   Collection Time: 01/23/23 12:38 PM   Specimen: Right Antecubital; Blood  Result Value Ref Range Status   Specimen Description   Final    RIGHT ANTECUBITAL BOTTLES DRAWN AEROBIC AND ANAEROBIC   Special Requests   Final    Blood Culture results may not be optimal due to an inadequate volume of blood received in culture bottles   Culture   Final    NO  GROWTH 5 DAYS Performed at [facility], [address] [place], [place] [address]    Report Status 01/28/2023 FINAL  Final  SARS Coronavirus 2 by RT PCR (hospital order, performed in [facility] hospital lab) *cepheid single result test* Anterior Nasal Swab     Status: None   Collection Time: 01/23/23  4:20 PM   Specimen: Anterior Nasal Swab  Result Value Ref Range Status   SARS Coronavirus 2 by RT PCR NEGATIVE NEGATIVE Final    Comment: (NOTE) SARS-CoV-2 target nucleic acids are NOT  DETECTED.  The SARS-CoV-2 RNA is generally detectable in upper and lower respiratory specimens during the acute phase of infection. The lowest concentration of SARS-CoV-2 viral copies this assay can detect is 250 copies / mL. A negative result does not preclude SARS-CoV-2 infection and should not be used as the sole basis for treatment or other patient management decisions.  A negative result may occur with improper specimen collection / handling, submission of specimen other than nasopharyngeal swab, presence of viral mutation(s) within the areas targeted by this assay, and inadequate number of viral copies (<250 copies / mL). A negative result must be combined with clinical observations, patient history, and epidemiological information.  Fact Sheet for Patients:   RoadLapTop.co.za  Fact Sheet for Healthcare Providers: http://kim-miller.com/  This test is not yet approved or  cleared by the [place] FDA and has been authorized for detection and/or diagnosis of SARS-CoV-2 by FDA under an Emergency Use Authorization (EUA).  This EUA will remain in effect (meaning this test can be used) for the duration of the COVID-19 declaration under Section 564(b)(1) of the Act, 21 U.S.C. section 360bbb-3(b)(1), unless the authorization is terminated or revoked sooner.  Performed at [facility], [address] [place], [place] [address]      Labs: BNP (last 3  results) Recent Labs    01/23/23 1234  BNP 848.0*   Basic Metabolic Panel: Recent Labs  Lab 01/23/23 1207 01/23/23 1403 01/24/23 0442 01/25/23 0829 01/26/23 0409 01/27/23 0444 01/28/23 0407  NA 136   < > 136 138 140 136 136  K 3.5   < > 3.1* 3.2* 3.3* 3.4* 3.7  CL 97*   < > 101 104 108 107 108  CO2 29  --  27 23 23  20* 20*  GLUCOSE 106*   < > 90 91 87 96 85  BUN 54*   < > 51* 42* 31* 24* 19  CREATININE 2.52*   < > 2.33* 2.29* 1.94* 1.78* 1.67*  CALCIUM >15.0*  --  14.0* 11.6* 10.4* 9.1 8.5*  MG 1.5*  --   --   --  1.2* 1.7 1.6*   < > = values in this interval not displayed.   Liver Function Tests: Recent Labs  Lab 01/23/23 1207 01/25/23 0829 01/26/23 0409 01/27/23 0444  AST 104*  --   --  23  ALT 53*  --   --  33  ALKPHOS 52  --   --  41  BILITOT 0.6  --   --  0.3  PROT 9.6*  --   --  7.2  ALBUMIN 2.3* 2.1* 1.8* 1.7*   Recent Labs  Lab 01/23/23 1207  LIPASE 167*   No results for input(s): "AMMONIA" in the last 168 hours. CBC: Recent Labs  Lab 01/23/23 1207 01/23/23 1403 01/24/23 0442 01/26/23 0409 01/26/23 1058 01/27/23 0444 01/27/23 1319 01/28/23 0615  WBC 8.0  --  8.4 6.8  --  6.0  --  5.8  NEUTROABS 5.7  --   --   --   --   --   --   --   HGB 10.1*   < > 8.7* 7.5* 8.0* 7.2* 8.2* 7.5*  HCT 30.3*   < > 25.8* 23.1* 24.5* 21.8* 25.5* 23.4*  MCV 99.3  --  98.5 101.3*  --  101.4*  --  102.2*  PLT 211  --  179 192  --  171  --  194   < > = values in this interval  not displayed.   Cardiac Enzymes: No results for input(s): "CKTOTAL", "CKMB", "CKMBINDEX", "TROPONINI" in the last 168 hours. BNP: Invalid input(s): "POCBNP" CBG: No results for input(s): "GLUCAP" in the last 168 hours. D-Dimer No results for input(s): "DDIMER" in the last 72 hours. Hgb A1c No results for input(s): "HGBA1C" in the last 72 hours. Lipid Profile No results for input(s): "CHOL", "HDL", "LDLCALC", "TRIG", "CHOLHDL", "LDLDIRECT" in the last 72 hours. Thyroid function  studies No results for input(s): "TSH", "T4TOTAL", "T3FREE", "THYROIDAB" in the last 72 hours.  Invalid input(s): "FREET3" Anemia work up Recent Labs    01/26/23 0800  FOLATE 9.1   Urinalysis    Component Value Date/Time   COLORURINE YELLOW 01/23/2023 1342   APPEARANCEUR CLOUDY (A) 01/23/2023 1342   LABSPEC 1.009 01/23/2023 1342   PHURINE 7.0 01/23/2023 1342   GLUCOSEU NEGATIVE 01/23/2023 1342   HGBUR NEGATIVE 01/23/2023 1342   BILIRUBINUR NEGATIVE 01/23/2023 1342   KETONESUR NEGATIVE 01/23/2023 1342   PROTEINUR 30 (A) 01/23/2023 1342   UROBILINOGEN 0.2 09/26/2013 1042   NITRITE NEGATIVE 01/23/2023 1342   LEUKOCYTESUR NEGATIVE 01/23/2023 1342   Sepsis Labs Recent Labs  Lab 01/24/23 0442 01/26/23 0409 01/27/23 0444 01/28/23 0615  WBC 8.4 6.8 6.0 5.8   Microbiology Recent Results (from the past 240 hour(s))  Blood Culture (routine x 2)     Status: None   Collection Time: 01/23/23 12:33 PM   Specimen: Left Antecubital; Blood  Result Value Ref Range Status   Specimen Description   Final    LEFT ANTECUBITAL BOTTLES DRAWN AEROBIC AND ANAEROBIC   Special Requests Blood Culture adequate volume  Final   Culture   Final    NO GROWTH 5 DAYS Performed at [facility], [address] [place], [place] [address]    Report Status 01/28/2023 FINAL  Final  Blood Culture (routine x 2)     Status: None   Collection Time: 01/23/23 12:38 PM   Specimen: Right Antecubital; Blood  Result Value Ref Range Status   Specimen Description   Final    RIGHT ANTECUBITAL BOTTLES DRAWN AEROBIC AND ANAEROBIC   Special Requests   Final    Blood Culture results may not be optimal due to an inadequate volume of blood received in culture bottles   Culture   Final    NO GROWTH 5 DAYS Performed at [facility], [address], [place], [place] [address]    Report Status 01/28/2023 FINAL  Final  SARS Coronavirus 2 by RT PCR (hospital order, performed in [facility] hospital lab) *cepheid single  result test* Anterior Nasal Swab     Status: None   Collection Time: 01/23/23  4:20 PM   Specimen: Anterior Nasal Swab  Result Value Ref Range Status   SARS Coronavirus 2 by RT PCR NEGATIVE NEGATIVE Final    Comment: (NOTE) SARS-CoV-2 target nucleic acids are NOT DETECTED.  The SARS-CoV-2 RNA is generally detectable in upper and lower respiratory specimens during the acute phase of infection. The lowest concentration of SARS-CoV-2 viral copies this assay can detect is 250 copies / mL. A negative result does not preclude SARS-CoV-2 infection and should not be used as the sole basis for treatment or other patient management decisions.  A negative result may occur with improper specimen collection / handling, submission of specimen other than nasopharyngeal swab, presence of viral mutation(s) within the areas targeted by this assay, and inadequate number of viral copies (<250 copies / mL). A negative result must be combined  with clinical observations, patient history, and epidemiological information.  Fact Sheet for Patients:   RoadLapTop.co.za  Fact Sheet for Healthcare Providers: http://kim-miller.com/  This test is not yet approved or  cleared by the [place] FDA and has been authorized for detection and/or diagnosis of SARS-CoV-2 by FDA under an Emergency Use Authorization (EUA).  This EUA will remain in effect (meaning this test can be used) for the duration of the COVID-19 declaration under Section 564(b)(1) of the Act, 21 U.S.C. section 360bbb-3(b)(1), unless the authorization is terminated or revoked sooner.  Performed at [facility], [address] [place], [place] [address]      Time coordinating discharge: 35 minutes  SIGNED:   Erick Blinks, DO [facility] 01/29/2023, 7:20 AM  If 7PM-7AM, please contact night-coverage [email or web address]

## 2023-01-28 NOTE — Progress Notes (Signed)
Patient has been up in chair most of the day. No complaints of pain today.

## 2023-01-28 NOTE — TOC Progression Note (Signed)
Transition of Care [facility]) - Progression Note    Patient Details  Name: Kathy [name] MRN: [id number] Date of Birth: 12-31-1940  [facility] CM/SW Contact  Villa Herb, [place] Phone Number: 01/28/2023, 2:06 PM  Clinical Narrative:    Insurance auth still pending at this time. CSW updated Destiny with [facility] who states they can take pt over the weekend but will need updated D/C and auth by 11. CSW to leave update for wknd SW. TOC to follow.   Expected Discharge Plan: Skilled Nursing Facility Barriers to Discharge: Continued Medical Work up  Expected Discharge Plan and Services In-house Referral: Clinical Social Work   Post Acute Care Choice: Skilled Nursing Facility Living arrangements for the past 2 months: Single Family Home Expected Discharge Date: 01/28/23                                     Social Determinants of Health (SDOH) Interventions SDOH Screenings   Food Insecurity: No Food Insecurity (01/23/2023)  Housing: Low Risk  (01/23/2023)  Transportation Needs: No Transportation Needs (01/23/2023)  Utilities: Not At Risk (01/23/2023)  Alcohol Screen: Low Risk  (09/14/2021)  Depression (PHQ2-9): Low Risk  (11/23/2022)  Financial Resource Strain: Low Risk  (09/14/2021)  Physical Activity: Sufficiently Active (09/14/2021)  Social Connections: Moderately Isolated (09/14/2021)  Stress: No Stress Concern Present (09/14/2021)  Tobacco Use: Low Risk  (01/23/2023)    Readmission Risk Interventions     No data to display

## 2023-01-28 NOTE — Care Management Important Message (Signed)
Important Message  Patient Details  Name: Kathy [name] MRN: [id number] Date of Birth: March 01, 1941   Medicare Important Message Given:  Yes     Corey Harold 01/28/2023, 12:06 PM

## 2023-01-28 NOTE — Consult Note (Signed)
   [facility] Inpatient Consult   01/28/2023  Kathy [name] 1941-02-21 [id number]  [facility] [[facility]]  Accountable Care Organization [ACO] Patient: [employer] Medicare [Dual]  *[facility] Liaison remote coverage review for patient admitted to [facility]  coverage for Elliot Cousin RN HL   Primary Care Provider:  [name], MD with [facility]   Patient was reviewed for disposition barriers to care needs.  Patient is being recommended for a skilled nursing facility level of care for transition.  Patient was screened for hospitalization and on behalf of [facility] Care Coordination to assess for post hospital community care needs.    If the patient goes to a [facility] affiliated facility then, patient can be followed by [facility] RN with traditional Medicare and approved Medicare Advantage plans.  Currently, for [facility] is noted per latest inpatient TOC LCSW notes.   Plan:  If patient goes to a [name] affiliated facility,   Will notify the Community [facility] RN can follow for any known or needs for transitional care needs for returning to post facility care coordination needs to return to community.  For questions or referrals, please contact:   Charlesetta Shanks, RN BSN CCM [facility]  [phone] business mobile phone Toll free office [phone]  *Concierge Line  [phone] Fax number: [phone] [email or web address]

## 2023-01-29 NOTE — Progress Notes (Signed)
Patient slept on and off this shift, no complaints of pain. Patient ambulated from bed with walker in room and transfered from bed to bedside commode with assitance. Continued to monitor.

## 2023-01-29 NOTE — Progress Notes (Signed)
Patient seen and evaluated this a.m. with no new complaints or concerns noted.  No acute events noted overnight.  Please refer to discharge summary dictated 6/28 for full details.  Patient is stable for discharge to SNF.  Total care time: 15 minutes.

## 2023-01-29 NOTE — TOC Progression Note (Addendum)
Transition of Care [facility]) - Progression Note    Patient Details  Name: Kathy [name] MRN: [id number] Date of Birth: [birth year]  Transition of Care [facility]) CM/SW Contact  Larrie Kass, LCSW Phone Number: 01/29/2023, 10:04 AM  Clinical Narrative:    Pt's insurance auth went to Peer to Peer ,   "We are offering the treating practitioner an option to speak with a Medical Director before final determination. Provider to call: [phone] Option 5. Deadline is: 01/31/23 12:00 PM EDT If no response, MD will render determination."   MD made aware. TOC to follow.   ADDEN 3:00pm Pt insurance Berkley Harvey was approved, messaged Destiny, facility does not admit on Sundays, will have to wait for Monday or Tuesday next week. TOC to follow.   Expected Discharge Plan: Skilled Nursing Facility Barriers to Discharge: Continued Medical Work up  Expected Discharge Plan and Services In-house Referral: Clinical Social Work   Post Acute Care Choice: Skilled Nursing Facility Living arrangements for the past 2 months: Single Family Home Expected Discharge Date: 01/28/23                                     Social Determinants of Health (SDOH) Interventions SDOH Screenings   Food Insecurity: No Food Insecurity (01/23/2023)  Housing: Low Risk  (01/23/2023)  Transportation Needs: No Transportation Needs (01/23/2023)  Utilities: Not At Risk (01/23/2023)  Alcohol Screen: Low Risk  (09/14/2021)  Depression (PHQ2-9): Low Risk  (11/23/2022)  Financial Resource Strain: Low Risk  (09/14/2021)  Physical Activity: Sufficiently Active (09/14/2021)  Social Connections: Moderately Isolated (09/14/2021)  Stress: No Stress Concern Present (09/14/2021)  Tobacco Use: Low Risk  (01/23/2023)    Readmission Risk Interventions     No data to display

## 2023-01-30 LAB — BASIC METABOLIC PANEL
Anion gap: 7 (ref 5–15)
BUN: 13 mg/dL (ref 8–23)
CO2: 21 mmol/L — ABNORMAL LOW (ref 22–32)
Calcium: 7.2 mg/dL — ABNORMAL LOW (ref 8.9–10.3)
Chloride: 109 mmol/L (ref 98–111)
Creatinine, Ser: 1.29 mg/dL — ABNORMAL HIGH (ref 0.44–1.00)
GFR, Estimated: 42 mL/min — ABNORMAL LOW (ref >60)
Glucose, Bld: 81 mg/dL (ref 70–99)
Potassium: 2.9 mmol/L — ABNORMAL LOW (ref 3.5–5.1)
Sodium: 137 mmol/L (ref 135–145)

## 2023-01-30 LAB — CBC
HCT: 21.8 % — ABNORMAL LOW (ref 36.0–46.0)
Hemoglobin: 7.2 g/dL — ABNORMAL LOW (ref 12.0–15.0)
MCH: 33.2 pg (ref 26.0–34.0)
MCHC: 33 g/dL (ref 30.0–36.0)
MCV: 100.5 fL — ABNORMAL HIGH (ref 80.0–100.0)
Platelets: 205 10*3/uL (ref 150–400)
RBC: 2.17 MIL/uL — ABNORMAL LOW (ref 3.87–5.11)
RDW: 15.1 % (ref 11.5–15.5)
WBC: 4.8 10*3/uL (ref 4.0–10.5)
nRBC: 0 % (ref 0.0–0.2)

## 2023-01-30 LAB — PTH-RELATED PEPTIDE: PTH-related peptide: <2 pmol/L

## 2023-01-30 LAB — MAGNESIUM: Magnesium: 1.5 mg/dL — ABNORMAL LOW (ref 1.7–2.4)

## 2023-01-30 MED ORDER — SODIUM CHLORIDE 0.9 % IV SOLN: INTRAVENOUS | Status: DC | PRN

## 2023-01-30 MED ORDER — POTASSIUM CHLORIDE 10 MEQ/100ML IV SOLN
10.0000 meq | INTRAVENOUS | Status: AC
  Administered 2023-01-30 (×4): 10 meq via INTRAVENOUS
  Filled 2023-01-30 (×4): qty 100

## 2023-01-30 MED ORDER — POTASSIUM CHLORIDE CRYS ER 20 MEQ PO TBCR
40.0000 meq | EXTENDED_RELEASE_TABLET | Freq: Once | ORAL | Status: AC
  Administered 2023-01-30: 40 meq via ORAL
  Filled 2023-01-30: qty 2

## 2023-01-30 MED ORDER — CALCIUM GLUCONATE-NACL 1-0.675 GM/50ML-% IV SOLN
1.0000 g | Freq: Once | INTRAVENOUS | Status: AC
  Administered 2023-01-30: 1000 mg via INTRAVENOUS
  Filled 2023-01-30: qty 50

## 2023-01-30 MED ORDER — MAGNESIUM SULFATE 4 GM/100ML IV SOLN
4.0000 g | Freq: Once | INTRAVENOUS | Status: AC
  Administered 2023-01-30: 4 g via INTRAVENOUS
  Filled 2023-01-30: qty 100

## 2023-01-30 NOTE — Progress Notes (Signed)
Patient slept on and off this shift, no complaints of pain. She did ambulate in room with walker and transfer from bed to bedside commode with assistance. Continued to monitor.

## 2023-01-30 NOTE — Progress Notes (Signed)
PROGRESS NOTE    Kathy [name]  ZOX:[id number] DOB: September 19, 1940 DOA: 01/23/2023 PCP: [name], MD   Brief Narrative:    82 y.o. female with medical history significant for thyroid cancer, hypertension, RBBB, rheumatoid arthritis. Patient presented to the ED with complaints of generalized weakness for about 5 days duration.  She was admitted for symptomatic hypercalcemia that appears to be related to excessive intake of calcium supplementation at home.  Hypercalcemia is improving and she was also noted to have AKI which is now showing signs of improvement.  She is noted to have a downward trend in hemoglobin levels.  Stool with negative FOBT, plan to transfuse if further downtrending.  Awaiting SNF placement, but continues to have some persistent electrolyte abnormalities that will require replenishment.  Assessment & Plan:   Principal Problem:   Hypercalcemia Active Problems:   Hypertension   Hypothyroid   Anxiety   Rheumatoid arthritis (HCC)   Malignant tumor of thyroid gland (HCC)   Leg edema   AKI (acute kidney injury) (HCC)  Assessment and Plan:  Hypercalcemia - symptomatic-now with some hypocalcemia Symptomatic hypercalcemia due to excess supplementation Calcium quite elevated at > 15 on  presentation. Ca++ was bordering on low just 6 months ago. Presenting with generalized weakness, poor oral intake, mild lethargy.  She is on vitamin D, and calcium supplements daily, she tells me it was started 3 years ago and she has been on the same dose.  No change in medications.  Per med list she is also on denosumab.  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  At this time, no obvious malignancy identified.  - Differentials include vitamin D intoxication, new cancer or recurrence of cancer. - follow up PTH intact - 5 (suppressed) - Vit D levels- 25, 1, 25 levels: 58.88, 15.3  - continue hydration with IV NS  - continue  Calcitonin x 4 doses  - s/p 1 dose of 4mg  zoledronic acid for long-term control of hypercalcemia.  -Hold home vitamin D and calcium supplements  -EKG with some diffuse ST abnormalities, likely 2/ from Hypercalcemia, no chest pain, no dyspnea, cgeck troponin x 2, repeat EKG in a.m. -Replete calcium with calcium gluconate today   AKI (acute kidney injury)-improving Cr elevated 2.52, baseline 0.8.  In the setting of hypocalcemia, and poor oral intake.  No history at this time to suggest urinary outlet obstruction.   - Hydrate -check renal US : No collecting system dilatation. Left kidney is partially obscured by overlapping bowel gas and soft tissue. -Hold home as needed Lasix 20 mg  Hypomagnesemia/hypokalemia -Replete and reevaluate in a.m.  Worsening anemia -Anemia panel without any acute abnormalities -Stool FOBT negative -Stop heparin and placed on SCDs -PPI twice daily for now -Continue to monitor closely and transfuse for hemoglobin less than 7   Leg edema No swelling at this time.  Chest x-ray showed mild central pulmonary vascular congestion.  BNP elevated at 848, no prior to compare.  Last echo 03/2022 EF of 60 to 65%.  Follows with Dr. Wyline Mood. -IV fluids for now with significant hypercalcemia -Consider resumption of furosemide if appropriate    Rheumatoid arthritis  - with AKI, temporarily reduced dose of hydroxychloroquine by 50%   Anxiety Resumed low dose alprazolam as needed only given mild confusion   Hypothyroid Status post total thyroidectomy -Resumed home levothyroxine   Hypertension - resumed amlodipine with acceptable control      DVT prophylaxis: Edroy  heparin to SCDs Code Status: Full  Family Communication: telephone call to brother 6/30   Consultants:    Procedures:    Antimicrobials:  None   Subjective: Patient seen and evaluated today with ongoing weakness noted.  PT recommending SNF.  She denies any overt bleeding or other symptoms.  She is  overall feeling well, but continues to have some persistent electrolyte abnormalities.  Objective: Vitals:   01/29/23 0933 01/29/23 1404 01/29/23 2010 01/30/23 0501  BP: 135/75 108/86 (!) 103/58 103/60  Pulse:  78 79 73  Resp:  17 17 17   Temp:  98.6 F (37 C) 98.7 F (37.1 C) 98.7 F (37.1 C)  TempSrc:  Oral  Oral  SpO2:  100% 100% 98%  Weight:      Height:        Intake/Output Summary (Last 24 hours) at 01/30/2023 1017 Last data filed at 01/30/2023 0933 Gross per 24 hour  Intake 720 ml  Output --  Net 720 ml   Filed Weights   01/23/23 1121 01/23/23 1640  Weight: 69.9 kg 70.4 kg    Examination:  General exam: Appears calm and comfortable  Respiratory system: Clear to auscultation. Respiratory effort normal. Cardiovascular system: S1 & S2 heard, RRR.  Gastrointestinal system: Abdomen is soft Central nervous system: Alert and awake Extremities: No edema Skin: No significant lesions noted Psychiatry: Flat affect.    Data Reviewed: I have personally reviewed following labs and imaging studies  CBC: Recent Labs  Lab 01/23/23 1207 01/23/23 1403 01/24/23 0442 01/26/23 0409 01/26/23 1058 01/27/23 0444 01/27/23 1319 01/28/23 0615 01/30/23 0459  WBC 8.0  --  8.4 6.8  --  6.0  --  5.8 4.8  NEUTROABS 5.7  --   --   --   --   --   --   --   --   HGB 10.1*   < > 8.7* 7.5* 8.0* 7.2* 8.2* 7.5* 7.2*  HCT 30.3*   < > 25.8* 23.1* 24.5* 21.8* 25.5* 23.4* 21.8*  MCV 99.3  --  98.5 101.3*  --  101.4*  --  102.2* 100.5*  PLT 211  --  179 192  --  171  --  194 205   < > = values in this interval not displayed.   Basic Metabolic Panel: Recent Labs  Lab 01/23/23 1207 01/23/23 1403 01/25/23 0829 01/26/23 0409 01/27/23 0444 01/28/23 0407 01/30/23 0459  NA 136   < > 138 140 136 136 137  K 3.5   < > 3.2* 3.3* 3.4* 3.7 2.9*  CL 97*   < > 104 108 107 108 109  CO2 29   < > 23 23 20* 20* 21*  GLUCOSE 106*   < > 91 87 96 85 81  BUN 54*   < > 42* 31* 24* 19 13  CREATININE  2.52*   < > 2.29* 1.94* 1.78* 1.67* 1.29*  CALCIUM >15.0*   < > 11.6* 10.4* 9.1 8.5* 7.2*  MG 1.5*  --   --  1.2* 1.7 1.6* 1.5*   < > = values in this interval not displayed.   GFR: Estimated Creatinine Clearance: 31.2 mL/min (A) (by C-G formula based on SCr of 1.29 mg/dL (H)). Liver Function Tests: Recent Labs  Lab 01/23/23 1207 01/25/23 0829 01/26/23 0409 01/27/23 0444  AST 104*  --   --  23  ALT 53*  --   --  33  ALKPHOS 52  --   --  41  BILITOT 0.6  --   --  0.3  PROT 9.6*  --   --  7.2  ALBUMIN 2.3* 2.1* 1.8* 1.7*   Recent Labs  Lab 01/23/23 1207  LIPASE 167*   No results for input(s): "AMMONIA" in the last 168 hours. Coagulation Profile: Recent Labs  Lab 01/23/23 1400  INR 1.1   Cardiac Enzymes: No results for input(s): "CKTOTAL", "CKMB", "CKMBINDEX", "TROPONINI" in the last 168 hours. BNP (last 3 results) No results for input(s): "PROBNP" in the last 8760 hours. HbA1C: No results for input(s): "HGBA1C" in the last 72 hours. CBG: No results for input(s): "GLUCAP" in the last 168 hours. Lipid Profile: No results for input(s): "CHOL", "HDL", "LDLCALC", "TRIG", "CHOLHDL", "LDLDIRECT" in the last 72 hours. Thyroid Function Tests: No results for input(s): "TSH", "T4TOTAL", "FREET4", "T3FREE", "THYROIDAB" in the last 72 hours. Anemia Panel: No results for input(s): "VITAMINB12", "FOLATE", "FERRITIN", "TIBC", "IRON", "RETICCTPCT" in the last 72 hours.  Sepsis Labs: Recent Labs  Lab 01/23/23 1321 01/23/23 1501  LATICACIDVEN 1.2 1.1    Recent Results (from the past 240 hour(s))  Blood Culture (routine x 2)     Status: None   Collection Time: 01/23/23 12:33 PM   Specimen: Left Antecubital; Blood  Result Value Ref Range Status   Specimen Description   Final    LEFT ANTECUBITAL BOTTLES DRAWN AEROBIC AND ANAEROBIC   Special Requests Blood Culture adequate volume  Final   Culture   Final    NO GROWTH 5 DAYS Performed at [facility], [address]  [place], [place] [address]    Report Status 01/28/2023 FINAL  Final  Blood Culture (routine x 2)     Status: None   Collection Time: 01/23/23 12:38 PM   Specimen: Right Antecubital; Blood  Result Value Ref Range Status   Specimen Description   Final    RIGHT ANTECUBITAL BOTTLES DRAWN AEROBIC AND ANAEROBIC   Special Requests   Final    Blood Culture results may not be optimal due to an inadequate volume of blood received in culture bottles   Culture   Final    NO GROWTH 5 DAYS Performed at [facility], [address] [place], [place] [address]    Report Status 01/28/2023 FINAL  Final  SARS Coronavirus 2 by RT PCR (hospital order, performed in [facility] hospital lab) *cepheid single result test* Anterior Nasal Swab     Status: None   Collection Time: 01/23/23  4:20 PM   Specimen: Anterior Nasal Swab  Result Value Ref Range Status   SARS Coronavirus 2 by RT PCR NEGATIVE NEGATIVE Final    Comment: (NOTE) SARS-CoV-2 target nucleic acids are NOT DETECTED.  The SARS-CoV-2 RNA is generally detectable in upper and lower respiratory specimens during the acute phase of infection. The lowest concentration of SARS-CoV-2 viral copies this assay can detect is 250 copies / mL. A negative result does not preclude SARS-CoV-2 infection and should not be used as the sole basis for treatment or other patient management decisions.  A negative result may occur with improper specimen collection / handling, submission of specimen other than nasopharyngeal swab, presence of viral mutation(s) within the areas targeted by this assay, and inadequate number of viral copies (<250 copies / mL). A negative result must be combined with clinical observations, patient history, and epidemiological information.  Fact Sheet for Patients:   RoadLapTop.co.za  Fact Sheet for Healthcare Providers: http://kim-miller.com/  This test is not yet approved or  cleared by the  [place]  [employer] and has been authorized for detection and/or diagnosis of SARS-CoV-2 by FDA under an TEFL teacher (EUA).  This EUA will remain in effect (meaning this test can be used) for the duration of the COVID-19 declaration under Section 564(b)(1) of the Act, 21 U.S.C. section 360bbb-3(b)(1), unless the authorization is terminated or revoked sooner.  Performed at [facility], [address], [place], [place] [address]          Radiology Studies: No results found.      Scheduled Meds:  amLODipine  5 mg Oral Daily   hydroxychloroquine  200 mg Oral Daily   levothyroxine  88 mcg Oral QAC breakfast   magnesium oxide  400 mg Oral BID   pantoprazole  40 mg Oral BID   Continuous Infusions:  sodium chloride 10 mL/hr at 01/30/23 0913   calcium gluconate 1,000 mg (01/30/23 0925)   magnesium sulfate bolus IVPB 4 g (01/30/23 0915)   potassium chloride       LOS: 7 days    Time spent: 35 minutes    Johany Hansman Hoover Brunette, DO [facility]  If 7PM-7AM, please contact night-coverage [email or web address] 01/30/2023, 10:17 AM

## 2023-01-30 NOTE — Progress Notes (Signed)
Alert and oriented x 4.  Started on potassium po and 4 IV runs for K of 2.8.  Stated not having diarrhea or vomiting.

## 2023-01-31 DIAGNOSIS — M62561 Muscle wasting and atrophy, not elsewhere classified, right lower leg: Secondary | ICD-10-CM | POA: Diagnosis not present

## 2023-01-31 DIAGNOSIS — M069 Rheumatoid arthritis, unspecified: Secondary | ICD-10-CM | POA: Diagnosis not present

## 2023-01-31 DIAGNOSIS — R2689 Other abnormalities of gait and mobility: Secondary | ICD-10-CM | POA: Diagnosis not present

## 2023-01-31 DIAGNOSIS — M0579 Rheumatoid arthritis with rheumatoid factor of multiple sites without organ or systems involvement: Secondary | ICD-10-CM | POA: Diagnosis not present

## 2023-01-31 DIAGNOSIS — M62562 Muscle wasting and atrophy, not elsewhere classified, left lower leg: Secondary | ICD-10-CM | POA: Diagnosis not present

## 2023-01-31 DIAGNOSIS — M62522 Muscle wasting and atrophy, not elsewhere classified, left upper arm: Secondary | ICD-10-CM | POA: Diagnosis not present

## 2023-01-31 DIAGNOSIS — E039 Hypothyroidism, unspecified: Secondary | ICD-10-CM | POA: Diagnosis not present

## 2023-01-31 DIAGNOSIS — I10 Essential (primary) hypertension: Secondary | ICD-10-CM | POA: Diagnosis not present

## 2023-01-31 DIAGNOSIS — R600 Localized edema: Secondary | ICD-10-CM | POA: Diagnosis not present

## 2023-01-31 DIAGNOSIS — E876 Hypokalemia: Secondary | ICD-10-CM | POA: Diagnosis not present

## 2023-01-31 DIAGNOSIS — M62521 Muscle wasting and atrophy, not elsewhere classified, right upper arm: Secondary | ICD-10-CM | POA: Diagnosis not present

## 2023-01-31 LAB — CBC
HCT: 22.7 % — ABNORMAL LOW (ref 36.0–46.0)
Hemoglobin: 7.5 g/dL — ABNORMAL LOW (ref 12.0–15.0)
MCH: 33.5 pg (ref 26.0–34.0)
MCHC: 33 g/dL (ref 30.0–36.0)
MCV: 101.3 fL — ABNORMAL HIGH (ref 80.0–100.0)
Platelets: 218 10*3/uL (ref 150–400)
RBC: 2.24 MIL/uL — ABNORMAL LOW (ref 3.87–5.11)
RDW: 15.5 % (ref 11.5–15.5)
WBC: 5 10*3/uL (ref 4.0–10.5)
nRBC: 0 % (ref 0.0–0.2)

## 2023-01-31 LAB — BASIC METABOLIC PANEL
Anion gap: 10 (ref 5–15)
BUN: 11 mg/dL (ref 8–23)
CO2: 17 mmol/L — ABNORMAL LOW (ref 22–32)
Calcium: 7.3 mg/dL — ABNORMAL LOW (ref 8.9–10.3)
Chloride: 109 mmol/L (ref 98–111)
Creatinine, Ser: 1.04 mg/dL — ABNORMAL HIGH (ref 0.44–1.00)
GFR, Estimated: 54 mL/min — ABNORMAL LOW (ref >60)
Glucose, Bld: 83 mg/dL (ref 70–99)
Potassium: 3.6 mmol/L (ref 3.5–5.1)
Sodium: 136 mmol/L (ref 135–145)

## 2023-01-31 LAB — MAGNESIUM: Magnesium: 2.2 mg/dL (ref 1.7–2.4)

## 2023-01-31 MED ORDER — POTASSIUM CHLORIDE CRYS ER 20 MEQ PO TBCR
20.0000 meq | EXTENDED_RELEASE_TABLET | Freq: Every day | ORAL | 3 refills | Status: DC
Start: 2023-01-31 — End: 2023-03-16

## 2023-01-31 MED ORDER — POTASSIUM CHLORIDE CRYS ER 20 MEQ PO TBCR
40.0000 meq | EXTENDED_RELEASE_TABLET | Freq: Once | ORAL | Status: AC
  Administered 2023-01-31: 40 meq via ORAL
  Filled 2023-01-31: qty 2

## 2023-01-31 MED ORDER — OYSTER SHELL CALCIUM/D3 500-5 MG-MCG PO TABS: 1.0000 | ORAL_TABLET | Freq: Every day | ORAL | 0 refills | Status: DC

## 2023-01-31 NOTE — Discharge Summary (Signed)
Physician Discharge Summary  Kathy [name] ZOX:[id number] DOB: 01/07/1941 DOA: 01/23/2023  PCP: [name], MD  Admit date: 01/23/2023  Discharge date: 01/31/2023  Admitted From:Home  Disposition:  SNF  Recommendations for Outpatient Follow-up:  Follow up with PCP in 1-2 weeks Please obtain repeat BMP in 1 week and follow potassium, magnesium, and calcium levels carefully Follow-up CBC in 1 week Started on supplementation for potassium, magnesium, and calcium and remain on medications as prescribed  Home Health: None  Equipment/Devices: None  Discharge Condition:Stable  CODE STATUS: Full  Diet recommendation: Heart Healthy  Brief/Interim Summary: 82 y.o. female with medical history significant for thyroid cancer, hypertension, RBBB, rheumatoid arthritis. Patient presented to the ED with complaints of generalized weakness for about 5 days duration.  She was admitted for symptomatic hypercalcemia that appears to be related to excessive intake of calcium supplementation at home.  Hypercalcemia is improving and she was also noted to have AKI which is now showing signs of improvement.  Her hemoglobin levels have remained stable and she does not have any overt bleeding or positive FOBT.  She is now having some issues with deficiencies in magnesium, potassium, and calcium levels and will require some supplementation to help ensure sufficient levels.  She will require close follow-up in approximately a week to ensure that she continues to do well.  No other acute events or concerns noted.  PT recommending SNF and she is in stable condition for discharge.  Discharge Diagnoses:  Principal Problem:   Hypercalcemia Active Problems:   Hypertension   Hypothyroid   Anxiety   Rheumatoid arthritis (HCC)   Malignant tumor of thyroid gland (HCC)   Leg edema   AKI (acute kidney injury) (HCC)  Principal discharge diagnosis: Symptomatic hypercalcemia in the setting of excessive  supplementation as well as electrolyte abnormalities with hypomagnesemia and hypokalemia.  Persistent anemia without overt bleeding.  AKI-resolved.  Discharge Instructions  Discharge Instructions     Diet - low sodium heart healthy   Complete by: As directed    Diet - low sodium heart healthy   Complete by: As directed    Increase activity slowly   Complete by: As directed    Increase activity slowly   Complete by: As directed       Allergies as of 01/31/2023       Reactions   Asa [aspirin] Nausea And Vomiting, Other (See Comments)   GI intolerance Hx of stomach ulcers   Macrobid [nitrofurantoin] Other (See Comments)   Stomach pain        Medication List     TAKE these medications    acetaminophen 650 MG CR tablet Commonly known as: TYLENOL Take 650 mg by mouth at bedtime.   ALPRAZolam 0.25 MG tablet Commonly known as: XANAX Take 1 tablet (0.25 mg total) by mouth 2 (two) times daily as needed for anxiety or sleep.   amLODipine 5 MG tablet Commonly known as: NORVASC Take 1 tablet (5 mg total) by mouth daily.   calcium-vitamin D 500-5 MG-MCG tablet Commonly known as: OSCAL WITH D Take 1 tablet by mouth daily with breakfast.   diclofenac Sodium 1 % Gel Commonly known as: VOLTAREN APPLY (1) GRAM TO AFFECTED AREA DAILY. What changed:  how much to take how to take this when to take this additional instructions   ferrous sulfate 325 (65 FE) MG EC tablet Take 1 tablet (325 mg total) by mouth daily.   FISH OIL PO Take 1 capsule by mouth daily.  furosemide 20 MG tablet Commonly known as: LASIX Take 1 tablet (20 mg total) by mouth 2 (two) times daily. What changed:  when to take this reasons to take this   Humira (2 Pen) 40 MG/0.4ML pen Generic drug: adalimumab Inject 40 mg into the skin every Saturday.   hydroxychloroquine 200 MG tablet Commonly known as: PLAQUENIL Take 200 mg by mouth 2 (two) times daily.   levothyroxine 88 MCG tablet Commonly  known as: SYNTHROID Take 88 mcg by mouth daily before breakfast.   magnesium oxide 400 (240 Mg) MG tablet Commonly known as: MAG-OX Take 1 tablet (400 mg total) by mouth 2 (two) times daily.   multivitamin with minerals Tabs tablet Take 1 tablet by mouth every morning.   Narcan 4 MG/0.1ML Liqd nasal spray kit Generic drug: naloxone Place 1 spray into the nose as needed (opioid reversal).   omeprazole 40 MG capsule Commonly known as: PRILOSEC Take 1 capsule (40 mg total) by mouth daily before breakfast. Open capsule and swallow granules with liquid or applesauce   oxyCODONE-acetaminophen 5-325 MG tablet Commonly known as: PERCOCET/ROXICET Take 1 tablet by mouth every 8 (eight) hours as needed (pain not relieved by tramadol).   potassium chloride SA 20 MEQ tablet Commonly known as: KLOR-CON M Take 1 tablet (20 mEq total) by mouth daily. TAKE 1 TABLET (20 MEQ) BY MOUTH DAILY PRN with Lasix (EDEMA) What changed:  how much to take how to take this when to take this   Prolia 60 MG/ML Sosy injection Generic drug: denosumab inject 60mg  Subcutaneous every 6 months 180 days   traMADol 50 MG tablet Commonly known as: ULTRAM Take 1 tablet (50 mg total) by mouth daily as needed for moderate pain.        Contact information for follow-up providers     [name], MD. Schedule an appointment as soon as possible for a visit in 1 week(s).   Specialty: Internal Medicine Contact information: [address] [place] [address] [phone]              Contact information for after-discharge care     Destination     [facility] Preferred SNF .   Service: Skilled Nursing Contact information: [address] [place] [address] [phone]                    Allergies  Allergen Reactions   Jonne Ply [Aspirin] Nausea And Vomiting and Other (See Comments)    GI intolerance Hx of stomach ulcers   Macrobid [Nitrofurantoin]  Other (See Comments)    Stomach pain    Consultations: None   Procedures/Studies: US RENAL  Result Date: 01/25/2023 CLINICAL DATA:  Acute kidney insufficiency EXAM: RENAL / URINARY TRACT ULTRASOUND COMPLETE COMPARISON:  None Available. FINDINGS: Right Kidney: Renal measurements: 10.6 x 3.6 x 5.2 cm = volume: 103.8 mL. Echogenicity within normal limits. No mass or hydronephrosis visualized. Left Kidney: Renal measurements: 9.1 x 4.7 x 5.5 cm = volume: No collecting system dilatation or perinephric fluid although significant portions are obscured by overlapping bowel gas and soft tissue. ML. Echogenicity within normal limits. No mass or hydronephrosis visualized. Bladder: Appears normal for degree of bladder distention. Other: None. IMPRESSION: No collecting system dilatation. Left kidney is partially obscured by overlapping bowel gas and soft tissue. Electronically Signed   By: Karen Kays M.D.   On: 01/25/2023 13:10   CT HEAD WO CONTRAST ( )  Result Date: 01/24/2023 CLINICAL DATA:  Headache. EXAM: CT HEAD WITHOUT CONTRAST TECHNIQUE: Contiguous axial images were obtained from the base of the skull through the vertex without intravenous contrast. RADIATION DOSE REDUCTION: This exam was performed according to the departmental dose-optimization program which includes automated exposure control, adjustment of the mA and/or kV according to patient size and/or use of iterative reconstruction technique. COMPARISON:  Head CT dated 06/09/2017. FINDINGS: Brain: Mild age-related atrophy and chronic microvascular ischemic changes. There is no acute intracranial hemorrhage. No mass effect or midline shift. No extra-axial fluid collection. Vascular: No hyperdense vessel or unexpected calcification. Skull: Normal. Negative for fracture or focal lesion. Sinuses/Orbits: No acute finding. Other: None IMPRESSION: 1. No acute intracranial pathology. 2. Mild age-related atrophy and chronic microvascular ischemic  changes. Electronically Signed   By: Elgie Collard M.D.   On: 01/24/2023 19:46   DG Chest Port 1 View  Result Date: 01/23/2023 CLINICAL DATA:  Sepsis. EXAM: PORTABLE CHEST 1 VIEW COMPARISON:  September 26, 2013. FINDINGS: Mild cardiomegaly is noted with mild central pulmonary vascular congestion. No consolidative process is noted. Status post bilateral shoulder arthroplasties. IMPRESSION: Mild cardiomegaly with mild central pulmonary vascular congestion. Electronically Signed   By: Lupita Raider M.D.   On: 01/23/2023 12:57     Discharge Exam: Vitals:   01/30/23 2135 01/31/23 0532  BP: 100/65 100/60  Pulse: 73 72  Resp: 18 18  Temp: 98.8 F (37.1 C) 98.7 F (37.1 C)  SpO2: 100% 99%   Vitals:   01/30/23 0501 01/30/23 1427 01/30/23 2135 01/31/23 0532  BP: 103/60 111/65 100/65 100/60  Pulse: 73 85 73 72  Resp: 17 19 18 18   Temp: 98.7 F (37.1 C) 98.4 F (36.9 C) 98.8 F (37.1 C) 98.7 F (37.1 C)  TempSrc: Oral Oral Oral Oral  SpO2: 98% 100% 100% 99%  Weight:      Height:        General: Pt is alert, awake, not in acute distress Cardiovascular: RRR, S1/S2 +, no rubs, no gallops Respiratory: CTA bilaterally, no wheezing, no rhonchi Abdominal: Soft, NT, ND, bowel sounds + Extremities: no edema, no cyanosis    The results of significant diagnostics from this hospitalization (including imaging, microbiology, ancillary and laboratory) are listed below for reference.     Microbiology: Recent Results (from the past 240 hour(s))  Blood Culture (routine x 2)     Status: None   Collection Time: 01/23/23 12:33 PM   Specimen: Left Antecubital; Blood  Result Value Ref Range Status   Specimen Description   Final    LEFT ANTECUBITAL BOTTLES DRAWN AEROBIC AND ANAEROBIC   Special Requests Blood Culture adequate volume  Final   Culture   Final    NO GROWTH 5 DAYS Performed at [facility], [address] [place], [place] [address]    Report Status 01/28/2023 FINAL  Final   Blood Culture (routine x 2)     Status: None   Collection Time: 01/23/23 12:38 PM   Specimen: Right Antecubital; Blood  Result Value Ref Range Status   Specimen Description   Final    RIGHT ANTECUBITAL BOTTLES DRAWN AEROBIC AND ANAEROBIC   Special Requests   Final    Blood Culture results may not be optimal due to an inadequate volume of blood received in culture bottles   Culture   Final    NO GROWTH 5 DAYS Performed at [facility], [address] [place], [place] [address]    Report Status 01/28/2023 FINAL  Final  SARS Coronavirus  2 by RT PCR (hospital order, performed in [facility] hospital lab) *cepheid single result test* Anterior Nasal Swab     Status: None   Collection Time: 01/23/23  4:20 PM   Specimen: Anterior Nasal Swab  Result Value Ref Range Status   SARS Coronavirus 2 by RT PCR NEGATIVE NEGATIVE Final    Comment: (NOTE) SARS-CoV-2 target nucleic acids are NOT DETECTED.  The SARS-CoV-2 RNA is generally detectable in upper and lower respiratory specimens during the acute phase of infection. The lowest concentration of SARS-CoV-2 viral copies this assay can detect is 250 copies / mL. A negative result does not preclude SARS-CoV-2 infection and should not be used as the sole basis for treatment or other patient management decisions.  A negative result may occur with improper specimen collection / handling, submission of specimen other than nasopharyngeal swab, presence of viral mutation(s) within the areas targeted by this assay, and inadequate number of viral copies (<250 copies / mL). A negative result must be combined with clinical observations, patient history, and epidemiological information.  Fact Sheet for Patients:   RoadLapTop.co.za  Fact Sheet for Healthcare Providers: http://kim-miller.com/  This test is not yet approved or  cleared by the [place] FDA and has been authorized for detection and/or  diagnosis of SARS-CoV-2 by FDA under an Emergency Use Authorization (EUA).  This EUA will remain in effect (meaning this test can be used) for the duration of the COVID-19 declaration under Section 564(b)(1) of the Act, 21 U.S.C. section 360bbb-3(b)(1), unless the authorization is terminated or revoked sooner.  Performed at [facility], [address], [place], [place] [address]      Labs: BNP (last 3 results) Recent Labs    01/23/23 1234  BNP 848.0*   Basic Metabolic Panel: Recent Labs  Lab 01/26/23 0409 01/27/23 0444 01/28/23 0407 01/30/23 0459 01/31/23 0442  NA 140 136 136 137 136  K 3.3* 3.4* 3.7 2.9* 3.6  CL 108 107 108 109 109  CO2 23 20* 20* 21* 17*  GLUCOSE 87 96 85 81 83  BUN 31* 24* 19 13 11   CREATININE 1.94* 1.78* 1.67* 1.29* 1.04*  CALCIUM 10.4* 9.1 8.5* 7.2* 7.3*  MG 1.2* 1.7 1.6* 1.5* 2.2   Liver Function Tests: Recent Labs  Lab 01/25/23 0829 01/26/23 0409 01/27/23 0444  AST  --   --  23  ALT  --   --  33  ALKPHOS  --   --  41  BILITOT  --   --  0.3  PROT  --   --  7.2  ALBUMIN 2.1* 1.8* 1.7*   No results for input(s): "LIPASE", "AMYLASE" in the last 168 hours. No results for input(s): "AMMONIA" in the last 168 hours. CBC: Recent Labs  Lab 01/26/23 0409 01/26/23 1058 01/27/23 0444 01/27/23 1319 01/28/23 0615 01/30/23 0459 01/31/23 0638  WBC 6.8  --  6.0  --  5.8 4.8 5.0  HGB 7.5*   < > 7.2* 8.2* 7.5* 7.2* 7.5*  HCT 23.1*   < > 21.8* 25.5* 23.4* 21.8* 22.7*  MCV 101.3*  --  101.4*  --  102.2* 100.5* 101.3*  PLT 192  --  171  --  194 205 218   < > = values in this interval not displayed.   Cardiac Enzymes: No results for input(s): "CKTOTAL", "CKMB", "CKMBINDEX", "TROPONINI" in the last 168 hours. BNP: Invalid input(s): "POCBNP" CBG: No results for input(s): "GLUCAP" in the last 168 hours. D-Dimer No results for input(s): "  DDIMER" in the last 72 hours. Hgb A1c No results for input(s): "HGBA1C" in the last 72 hours. Lipid  Profile No results for input(s): "CHOL", "HDL", "LDLCALC", "TRIG", "CHOLHDL", "LDLDIRECT" in the last 72 hours. Thyroid function studies No results for input(s): "TSH", "T4TOTAL", "T3FREE", "THYROIDAB" in the last 72 hours.  Invalid input(s): "FREET3" Anemia work up No results for input(s): "VITAMINB12", "FOLATE", "FERRITIN", "TIBC", "IRON", "RETICCTPCT" in the last 72 hours. Urinalysis    Component Value Date/Time   COLORURINE YELLOW 01/23/2023 1342   APPEARANCEUR CLOUDY (A) 01/23/2023 1342   LABSPEC 1.009 01/23/2023 1342   PHURINE 7.0 01/23/2023 1342   GLUCOSEU NEGATIVE 01/23/2023 1342   HGBUR NEGATIVE 01/23/2023 1342   BILIRUBINUR NEGATIVE 01/23/2023 1342   KETONESUR NEGATIVE 01/23/2023 1342   PROTEINUR 30 (A) 01/23/2023 1342   UROBILINOGEN 0.2 09/26/2013 1042   NITRITE NEGATIVE 01/23/2023 1342   LEUKOCYTESUR NEGATIVE 01/23/2023 1342   Sepsis Labs Recent Labs  Lab 01/27/23 0444 01/28/23 0615 01/30/23 0459 01/31/23 0638  WBC 6.0 5.8 4.8 5.0   Microbiology Recent Results (from the past 240 hour(s))  Blood Culture (routine x 2)     Status: None   Collection Time: 01/23/23 12:33 PM   Specimen: Left Antecubital; Blood  Result Value Ref Range Status   Specimen Description   Final    LEFT ANTECUBITAL BOTTLES DRAWN AEROBIC AND ANAEROBIC   Special Requests Blood Culture adequate volume  Final   Culture   Final    NO GROWTH 5 DAYS Performed at [facility], [address], [place], [place] [address]    Report Status 01/28/2023 FINAL  Final  Blood Culture (routine x 2)     Status: None   Collection Time: 01/23/23 12:38 PM   Specimen: Right Antecubital; Blood  Result Value Ref Range Status   Specimen Description   Final    RIGHT ANTECUBITAL BOTTLES DRAWN AEROBIC AND ANAEROBIC   Special Requests   Final    Blood Culture results may not be optimal due to an inadequate volume of blood received in culture bottles   Culture   Final    NO GROWTH 5 DAYS Performed at [facility], [address], [place], [place] [address]    Report Status 01/28/2023 FINAL  Final  SARS Coronavirus 2 by RT PCR (hospital order, performed in [facility] hospital lab) *cepheid single result test* Anterior Nasal Swab     Status: None   Collection Time: 01/23/23  4:20 PM   Specimen: Anterior Nasal Swab  Result Value Ref Range Status   SARS Coronavirus 2 by RT PCR NEGATIVE NEGATIVE Final    Comment: (NOTE) SARS-CoV-2 target nucleic acids are NOT DETECTED.  The SARS-CoV-2 RNA is generally detectable in upper and lower respiratory specimens during the acute phase of infection. The lowest concentration of SARS-CoV-2 viral copies this assay can detect is 250 copies / mL. A negative result does not preclude SARS-CoV-2 infection and should not be used as the sole basis for treatment or other patient management decisions.  A negative result may occur with improper specimen collection / handling, submission of specimen other than nasopharyngeal swab, presence of viral mutation(s) within the areas targeted by this assay, and inadequate number of viral copies (<250 copies / mL). A negative result must be combined with clinical observations, patient history, and epidemiological information.  Fact Sheet for Patients:   RoadLapTop.co.za  Fact Sheet for Healthcare Providers: http://kim-miller.com/  This test is not yet approved or  cleared by the [place] FDA and has been authorized for detection and/or diagnosis of SARS-CoV-2 by FDA under an Emergency Use Authorization (EUA).  This EUA will remain in effect (meaning this test can be used) for the duration of the COVID-19 declaration under Section 564(b)(1) of the Act, 21 U.S.C. section 360bbb-3(b)(1), unless the authorization is terminated or revoked sooner.  Performed at [facility], [address] [place], [place] [address]      Time coordinating discharge: 35  minutes  SIGNED:   Erick Blinks, DO [facility] 01/31/2023, 8:44 AM  If 7PM-7AM, please contact night-coverage [email or web address]

## 2023-01-31 NOTE — Care Management Important Message (Signed)
Important Message  Patient Details  Name: Kathy [name] MRN: [id number] Date of Birth: 03/14/1941   Medicare Important Message Given:  Yes     Corey Harold 01/31/2023, 10:45 AM

## 2023-01-31 NOTE — TOC Transition Note (Signed)
Transition of Care [facility]) - CM/SW Discharge Note   Patient Details  Name: Kathy [name] MRN: [id number] Date of Birth: [birth year]  Transition of Care [facility]) CM/SW Contact:  Karn Cassis, LCSW Phone Number: 01/31/2023, 10:58 AM   Clinical Narrative:  Pt d/c today to [facility] SNF. Pt and facility aware and agreeable. D/C summary sent to SNF. Pt requests wheelchair transport. LCSW will arrange with [facility]. Waiver signed and on chart. RN given number to call report. Authorization received.      Final next level of care: Skilled Nursing Facility Barriers to Discharge: Barriers Resolved   Patient Goals and CMS Choice   Choice offered to / list presented to : Patient  Discharge Placement     Existing PASRR number confirmed : 01/31/23          Patient chooses bed at: Other - please specify in the comment section below: ([facility]) Patient to be transferred to facility by: [employer] Name of family member notified: Pt has notified family. Patient and family notified of of transfer: 01/31/23  Discharge Plan and Services Additional resources added to the After Visit Summary for   In-house Referral: Clinical Social Work   Post Acute Care Choice: Skilled Nursing Facility                               Social Determinants of Health (SDOH) Interventions SDOH Screenings   Food Insecurity: No Food Insecurity (01/23/2023)  Housing: Low Risk  (01/23/2023)  Transportation Needs: No Transportation Needs (01/23/2023)  Utilities: Not At Risk (01/23/2023)  Alcohol Screen: Low Risk  (09/14/2021)  Depression (PHQ2-9): Low Risk  (11/23/2022)  Financial Resource Strain: Low Risk  (09/14/2021)  Physical Activity: Sufficiently Active (09/14/2021)  Social Connections: Moderately Isolated (09/14/2021)  Stress: No Stress Concern Present (09/14/2021)  Tobacco Use: Low Risk  (01/23/2023)     Readmission Risk Interventions     No data to display

## 2023-02-02 ENCOUNTER — Other Ambulatory Visit (HOSPITAL_COMMUNITY): Payer: Self-pay

## 2023-02-02 ENCOUNTER — Other Ambulatory Visit: Payer: Self-pay | Admitting: *Deleted

## 2023-02-02 DIAGNOSIS — I10 Essential (primary) hypertension: Secondary | ICD-10-CM | POA: Diagnosis not present

## 2023-02-02 DIAGNOSIS — M0579 Rheumatoid arthritis with rheumatoid factor of multiple sites without organ or systems involvement: Secondary | ICD-10-CM | POA: Diagnosis not present

## 2023-02-02 NOTE — Patient Outreach (Signed)
Per [facility] Ms. Loss resides in [facility] skilled nursing facility. Screening for potential [facility] care coordination services as benefit of health plan and Primary Care Provider.   Secure communication sent to Richardean Sale social worker for collaboration about transition plans and potential [facility] care coordination needs.   Will continue to follow.  Raiford Noble, MSN, RN,BSN [facility] Post Acute Care Coordinator [phone] (Direct dial)

## 2023-02-08 ENCOUNTER — Other Ambulatory Visit: Payer: Self-pay | Admitting: *Deleted

## 2023-02-08 NOTE — Patient Outreach (Addendum)
Per [facility] Kathy [name] resides in [facility] skilled nursing facility. Screening for potential [facility] care coordination services as benefit of health plan and Primary Care Provider.   Previous update received from [place], [employer] social worker, indicated transition plan is to return home.   Telephone call made to Kathy [name] [phone] (non working line) and [phone] to discuss care coordination follow up post SNF. No answer. HIPAA compliant voicemail message left to request return call.   Will attempt outreach at later time. Will continue follow and collaborate with [employer] social worker.  Addendum: Received telephone call from Kathy [name].  Patient identifiers confirmed. Kathy [name] reports she lives alone. However, she states she has good church friends support. States her sister in law from [place] will come to stay with her for 2 weeks after she gets home. Writer explained care coordination services. Kathy [name] is agreeable. Confirmed best contact telephone number is [phone].  Will plan to make referral to care coordination team upon SNF discharge.   Raiford Noble, MSN, RN,BSN [facility] Post Acute Care Coordinator [phone] (Direct dial)

## 2023-02-14 DIAGNOSIS — M62562 Muscle wasting and atrophy, not elsewhere classified, left lower leg: Secondary | ICD-10-CM | POA: Diagnosis not present

## 2023-02-14 DIAGNOSIS — M6281 Muscle weakness (generalized): Secondary | ICD-10-CM | POA: Diagnosis not present

## 2023-02-15 ENCOUNTER — Telehealth: Payer: Self-pay | Admitting: Internal Medicine

## 2023-02-15 DIAGNOSIS — M6281 Muscle weakness (generalized): Secondary | ICD-10-CM | POA: Diagnosis not present

## 2023-02-15 DIAGNOSIS — M62562 Muscle wasting and atrophy, not elsewhere classified, left lower leg: Secondary | ICD-10-CM | POA: Diagnosis not present

## 2023-02-15 NOTE — Telephone Encounter (Signed)
Left message for Kathy [name]

## 2023-02-15 NOTE — Telephone Encounter (Signed)
Enzo Bi. [facility] called in on patient behalf  Verbal orders for PT  2 week 2 1 week 3  Call back [phone]

## 2023-02-16 DIAGNOSIS — M069 Rheumatoid arthritis, unspecified: Secondary | ICD-10-CM | POA: Diagnosis not present

## 2023-02-16 DIAGNOSIS — R2689 Other abnormalities of gait and mobility: Secondary | ICD-10-CM | POA: Diagnosis not present

## 2023-02-17 DIAGNOSIS — M62562 Muscle wasting and atrophy, not elsewhere classified, left lower leg: Secondary | ICD-10-CM | POA: Diagnosis not present

## 2023-02-17 DIAGNOSIS — M6281 Muscle weakness (generalized): Secondary | ICD-10-CM | POA: Diagnosis not present

## 2023-02-21 ENCOUNTER — Telehealth: Payer: Self-pay | Admitting: *Deleted

## 2023-02-21 ENCOUNTER — Other Ambulatory Visit: Payer: Self-pay | Admitting: *Deleted

## 2023-02-21 ENCOUNTER — Encounter: Payer: Self-pay | Admitting: Internal Medicine

## 2023-02-21 ENCOUNTER — Ambulatory Visit (INDEPENDENT_AMBULATORY_CARE_PROVIDER_SITE_OTHER): Payer: 59 | Admitting: Internal Medicine

## 2023-02-21 DIAGNOSIS — N179 Acute kidney failure, unspecified: Secondary | ICD-10-CM | POA: Diagnosis not present

## 2023-02-21 DIAGNOSIS — M7989 Other specified soft tissue disorders: Secondary | ICD-10-CM | POA: Diagnosis not present

## 2023-02-21 DIAGNOSIS — I10 Essential (primary) hypertension: Secondary | ICD-10-CM

## 2023-02-21 DIAGNOSIS — Z09 Encounter for follow-up examination after completed treatment for conditions other than malignant neoplasm: Secondary | ICD-10-CM | POA: Insufficient documentation

## 2023-02-21 DIAGNOSIS — M62562 Muscle wasting and atrophy, not elsewhere classified, left lower leg: Secondary | ICD-10-CM | POA: Diagnosis not present

## 2023-02-21 DIAGNOSIS — M6281 Muscle weakness (generalized): Secondary | ICD-10-CM | POA: Diagnosis not present

## 2023-02-21 DIAGNOSIS — N3941 Urge incontinence: Secondary | ICD-10-CM

## 2023-02-21 MED ORDER — OXYBUTYNIN CHLORIDE ER 10 MG PO TB24
10.0000 mg | ORAL_TABLET | Freq: Every day | ORAL | 1 refills | Status: DC
Start: 2023-02-21 — End: 2023-04-19

## 2023-02-21 NOTE — Progress Notes (Unsigned)
  Care Coordination  Outreach Note  02/21/2023 Name: Lanesha Azzaro Langseth MRN: [id number] DOB: 04-07-1941   Care Coordination Outreach Attempts: An unsuccessful telephone outreach was attempted today to offer the patient information about available care coordination services.  Follow Up Plan:  Additional outreach attempts will be made to offer the patient care coordination information and services.   Encounter Outcome:  Pt. Request to Call Back  [facility] Coordination Care Guide  Direct Dial: [phone]

## 2023-02-21 NOTE — Assessment & Plan Note (Signed)
Hospital chart reviewed, including discharge summary Medications reconciled and reviewed with the patient in detail 

## 2023-02-21 NOTE — Assessment & Plan Note (Signed)
Was deemed to be due to over supplementation of calcium, has started taking only 1 tablet of [place]-Cal with D Hypercalcemia resolved with IV fluids Check CMP

## 2023-02-21 NOTE — Progress Notes (Signed)
Established Patient Office Visit  Subjective:  Patient ID: Kathy [name], female    DOB: February 16, 1941  Age: 82 y.o. MRN: [id number]  CC:  Chief Complaint  Patient presents with   Hospitalization Follow-up    Hospital Follow up     HPI Kathy [name] is a 82 y.o. female with past medical history of HTN, RA, hypothyroidism, osteoporosis, HLD, anxiety and polyarthritis who presents for follow-up after recent hospitalization from 01/23/23-01/31/23 and then to [facility] from 01/31/23-02/11/23.  She was admitted for symptomatic hypercalcemia that appeared to be related to excessive intake of calcium supplementation at home.  Hypercalcemia had resolved with IV fluids and she was also noted to have AKI which was showing signs of improvement.  She was taking calcium 1500 mg in a day, but has decreased to 500 mg once a day now.  She still feels mild fatigue, but overall feels better.  Denies any fever, chills, dyspnea.  She reports chronic leg swelling, which is worse recently.  She takes Lasix 20 mg twice daily currently.  Of note, she takes amlodipine 5 mg QD.  Her BP is WNL and she asks whether she can discontinue amlodipine.  She agrees to take half tablet of amlodipine for now.  She also reports urinary urgency, but denies any dysuria, hematuria or urinary hesitancy or resistance.     Past Medical History:  Diagnosis Date   Abnormal findings on diagnostic imaging of heart and coronary circulation    Anemia    FROM BLEEDING ULCER   Anxiety    takes Alprazolam daily as needed   Arthritis    dx with RA 2017   Bariatric surgery status    Cataract    Diverticulosis    Gastro-esophageal reflux disease with esophagitis    Gastrojejunal ulcer with hemorrhage    Headache(784.0)    occasionally   History of blood transfusion    no abnormal reaction noted   History of bronchitis    last time many yrs ago   Hyperlipidemia    PT DENIES THIS DX -  ON NO MEDS AND NO ONE HAS TOLD HER    Hypertension    takes Amlodipine daily   Hypothyroidism    takes Synthroid daily   Insomnia    Joint pain    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   Localized edema    Nocturia    Numbness    occasionally left arm at night   Obesity    Osteoporosis    takes Fosamax weekly   Pain in joint involving pelvic region and thigh    Peripheral edema    takes Lasix daily as needed   PONV (postoperative nausea and vomiting)    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/2017   Rheumatoid arthritis, unspecified (HCC)    Rotator cuff arthropathy, 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   Sleep apnea    Phreesia 08/22/2020   Stomach ulcer    Thyroid disease    Phreesia 08/22/2020   Unspecified injury of muscle(s) and tendon(s) of the rotator cuff of left shoulder, subsequent encounter    Unspecified osteoarthritis, unspecified site    Wears glasses    Wears partial dentures     Past Surgical History:  Procedure Laterality Date   ABDOMINAL HYSTERECTOMY     partial   cataract surgery Bilateral    CHOLECYSTECTOMY     COLONOSCOPY     ESOPHAGOGASTRODUODENOSCOPY (EGD) WITH PROPOFOL N/A 01/11/2022   Procedure: ESOPHAGOGASTRODUODENOSCOPY (EGD) WITH PROPOFOL;  Surgeon: Iva Boop, MD;  Location: [facility] ENDOSCOPY;  Service: Gastroenterology;  Laterality: N/A;   EYE SURGERY     CATARACTS BOTH   gastric bypass surgery     HOT HEMOSTASIS N/A 01/11/2022   Procedure: HOT HEMOSTASIS (ARGON PLASMA COAGULATION/BICAP);  Surgeon: Iva Boop, MD;  Location: [facility] ENDOSCOPY;  Service: Gastroenterology;  Laterality: N/A;   JOINT REPLACEMENT Bilateral    hip   REVERSE SHOULDER ARTHROPLASTY Right 05/11/2016   Procedure: REVERSE SHOULDER ARTHROPLASTY;  Surgeon: Teryl Lucy, MD;  Location: [facility] OR;  Service: Orthopedics;  Laterality: Right;   REVISION TOTAL HIP ARTHROPLASTY Left 10/03/2013   DR Turner Daniels   ROTATOR CUFF REPAIR     SCLEROTHERAPY  01/11/2022   Procedure: Susa Day;  Surgeon: Iva Boop, MD;  Location: [facility] ENDOSCOPY;  Service: Gastroenterology;;   TOTAL HIP REVISION Left 10/03/2013   Procedure: TOTAL HIP REVISION- left;  Surgeon: Nestor Lewandowsky, MD;  Location: [facility] OR;  Service: Orthopedics;  Laterality: Left;   TOTAL KNEE ARTHROPLASTY Left 11/29/2017   Procedure: LEFT TOTAL KNEE ARTHROPLASTY;  Surgeon: Teryl Lucy, MD;  Location: [facility] OR;  Service: Orthopedics;  Laterality: Left;   TOTAL SHOULDER ARTHROPLASTY Left 11/09/2016   Procedure: TOTAL REVERSE SHOULDER ARTHROPLASTY;  Surgeon: Teryl Lucy, MD;  Location: [facility] OR;  Service: Orthopedics;  Laterality: Left;   TOTAL SHOULDER REPLACEMENT Left 10/2016   TOTAL THYROIDECTOMY     UPPER GASTROINTESTINAL ENDOSCOPY      Family History  Problem Relation Age of Onset   Diabetes Mother    Congestive Heart Failure Mother    Lung cancer Father    Thrombosis Sister    CAD Brother        CABG   Colon cancer Neg Hx    Esophageal cancer Neg Hx    Stomach cancer Neg Hx    Rectal cancer Neg Hx     Social History   Socioeconomic History   Marital status: Divorced    Spouse name: Not on file   Number of children: Not on file   Years of education: Not on file   Highest education level: Not on file  Occupational History   Occupation: disabled  Tobacco Use   Smoking status: Never    Passive exposure: Never   Smokeless tobacco: Never  Vaping Use   Vaping status: Never Used  Substance and Sexual Activity   Alcohol use: No   Drug use: No   Sexual activity: Not Currently    Birth control/protection: Surgical  Other Topics Concern   Not on file  Social History Narrative   Not on file   Social Determinants of Health   Financial Resource Strain: Low Risk  (09/14/2021)   Overall Financial Resource Strain (CARDIA)    Difficulty of Paying Living Expenses: Not hard at all  Food Insecurity: No Food Insecurity (01/23/2023)   Hunger Vital Sign    Worried About Running Out of Food in the Last Year: Never true    Ran Out of  Food in the Last Year: Never true  Transportation Needs: No Transportation Needs (01/23/2023)   PRAPARE - Administrator, Civil Service (Medical): No    Lack of Transportation (Non-Medical): No  Physical Activity: Sufficiently Active (09/14/2021)   Exercise Vital Sign    Days of Exercise per Week: 6 days    Minutes of Exercise per Session: 40 min  Stress: No Stress Concern Present (09/14/2021)   [employer] of Occupational Health - Occupational Stress  Questionnaire    Feeling of Stress : Not at all  Social Connections: Moderately Isolated (09/14/2021)   Social Connection and Isolation Panel [NHANES]    Frequency of Communication with Friends and Family: More than three times a week    Frequency of Social Gatherings with Friends and Family: More than three times a week    Attends Religious Services: More than 4 times per year    Active Member of [employer] or Organizations: No    Attends Banker Meetings: Never    Marital Status: Divorced  Catering manager Violence: Not At Risk (01/23/2023)   Humiliation, Afraid, Rape, and Kick questionnaire    Fear of Current or Ex-Partner: No    Emotionally Abused: No    Physically Abused: No    Sexually Abused: No    Outpatient Medications Prior to Visit  Medication Sig Dispense Refill   acetaminophen (TYLENOL) 650 MG CR tablet Take 650 mg by mouth at bedtime.     Adalimumab (HUMIRA PEN) 40 MG/0.4ML PNKT Inject 40 mg into the skin every Saturday.     ALPRAZolam (XANAX) 0.25 MG tablet Take 1 tablet (0.25 mg total) by mouth 2 (two) times daily as needed for anxiety or sleep. 10 tablet 0   amLODipine (NORVASC) 5 MG tablet Take 1 tablet (5 mg total) by mouth daily. 90 tablet 3   calcium-vitamin D (OSCAL WITH D) 500-5 MG-MCG tablet Take 1 tablet by mouth daily with breakfast. 30 tablet 0   diclofenac Sodium (VOLTAREN) 1 % GEL APPLY (1) GRAM TO AFFECTED AREA DAILY. (Patient taking differently: Apply 1 g topically daily.) 100 g 0    ferrous sulfate 325 (65 FE) MG EC tablet Take 1 tablet (325 mg total) by mouth daily. 60 tablet 3   furosemide (LASIX) 20 MG tablet Take 1 tablet (20 mg total) by mouth 2 (two) times daily. (Patient taking differently: Take 20 mg by mouth daily as needed for fluid.) 180 tablet 3   hydroxychloroquine (PLAQUENIL) 200 MG tablet Take 200 mg by mouth 2 (two) times daily.     levothyroxine (SYNTHROID) 88 MCG tablet Take 88 mcg by mouth daily before breakfast.     magnesium oxide (MAG-OX) 400 (240 Mg) MG tablet Take 1 tablet (400 mg total) by mouth 2 (two) times daily. 60 tablet 0   Multiple Vitamin (MULTIVITAMIN WITH MINERALS) TABS tablet Take 1 tablet by mouth every morning.     naloxone (NARCAN) nasal spray 4 mg/0.1 mL Place 1 spray into the nose as needed (opioid reversal).     Omega-3 Fatty Acids (FISH OIL PO) Take 1 capsule by mouth daily.     omeprazole (PRILOSEC) 40 MG capsule Take 1 capsule (40 mg total) by mouth daily before breakfast. Open capsule and swallow granules with liquid or applesauce 90 capsule 3   oxyCODONE-acetaminophen (PERCOCET/ROXICET) 5-325 MG tablet Take 1 tablet by mouth every 8 (eight) hours as needed (pain not relieved by tramadol). 10 tablet 0   potassium chloride SA (KLOR-CON M) 20 MEQ tablet Take 1 tablet (20 mEq total) by mouth daily. TAKE 1 TABLET (20 MEQ) BY MOUTH DAILY PRN with Lasix (EDEMA) 90 tablet 3   traMADol (ULTRAM) 50 MG tablet Take 1 tablet (50 mg total) by mouth daily as needed for moderate pain. 10 tablet 0   denosumab (PROLIA) 60 MG/ML SOSY injection inject 60mg  Subcutaneous every 6 months 180 days 1 mL 1   No facility-administered medications prior to visit.    Allergies  Allergen  Reactions   Asa [Aspirin] Nausea And Vomiting and Other (See Comments)    GI intolerance Hx of stomach ulcers   Macrobid [Nitrofurantoin] Other (See Comments)    Stomach pain    ROS Review of Systems  Constitutional:  Negative for chills and fever.  HENT:  Negative  for congestion, sinus pressure, sinus pain and sore throat.   Eyes:  Negative for pain and discharge.  Respiratory:  Negative for cough and shortness of breath.   Cardiovascular:  Negative for chest pain and palpitations.  Gastrointestinal:  Negative for abdominal pain, diarrhea, nausea and vomiting.  Endocrine: Negative for polydipsia and polyuria.  Genitourinary:  Positive for urgency. Negative for dysuria and hematuria.  Musculoskeletal:  Positive for arthralgias and back pain. Negative for neck pain and neck stiffness.  Skin:  Negative for rash.  Neurological:  Positive for weakness. Negative for dizziness.  Psychiatric/Behavioral:  Negative for agitation and behavioral problems.       Objective:    Physical Exam Vitals reviewed.  Constitutional:      General: She is not in acute distress.    Appearance: She is not diaphoretic.  HENT:     Head: Normocephalic and atraumatic.     Nose: Nose normal.     Mouth/Throat:     Mouth: Mucous membranes are moist.  Eyes:     General: No scleral icterus.    Extraocular Movements: Extraocular movements intact.     Pupils: Pupils are equal, round, and reactive to light.  Cardiovascular:     Rate and Rhythm: Normal rate and regular rhythm.     Pulses: Normal pulses.     Heart sounds: Normal heart sounds. No murmur heard. Pulmonary:     Breath sounds: Normal breath sounds. No wheezing or rales.  Abdominal:     Palpations: Abdomen is soft.     Tenderness: There is no abdominal tenderness.  Musculoskeletal:        General: Tenderness (Over MCPs and ICPs over b/l UE) and deformity (Ulnar deviation of b/l hands) present.     Cervical back: Neck supple. No tenderness.     Right lower leg: Edema (1+) present.     Left lower leg: Edema (1+) present.  Skin:    General: Skin is warm.     Findings: No rash.  Neurological:     General: No focal deficit present.     Mental Status: She is alert and oriented to person, place, and time.      Cranial Nerves: No cranial nerve deficit.     Sensory: No sensory deficit.     Motor: No weakness.  Psychiatric:        Mood and Affect: Mood normal.        Behavior: Behavior normal.     BP 117/70 (BP Location: Right Arm, Patient Position: Sitting, Cuff Size: Normal)   Pulse 85   Ht 5\' 1"  (1.549 m)   Wt 152 lb 6.4 oz (69.1 kg)   SpO2 93%   BMI 28.80 kg/m  Wt Readings from Last 3 Encounters:  02/21/23 152 lb 6.4 oz (69.1 kg)  01/23/23 155 lb 3.3 oz (70.4 kg)  12/13/22 160 lb 12.8 oz (72.9 kg)    Lab Results  Component Value Date   TSH 5.820 (H) 07/19/2022   Lab Results  Component Value Date   WBC 5.0 01/31/2023   HGB 7.5 (L) 01/31/2023   HCT 22.7 (L) 01/31/2023   MCV 101.3 (H) 01/31/2023   PLT 218  01/31/2023   Lab Results  Component Value Date   NA 136 01/31/2023   K 3.6 01/31/2023   CO2 17 (L) 01/31/2023   GLUCOSE 83 01/31/2023   BUN 11 01/31/2023   CREATININE 1.04 (H) 01/31/2023   BILITOT 0.3 01/27/2023   ALKPHOS 41 01/27/2023   AST 23 01/27/2023   ALT 33 01/27/2023   PROT 7.2 01/27/2023   ALBUMIN 1.7 (L) 01/27/2023   CALCIUM 7.3 (L) 01/31/2023   ANIONGAP 10 01/31/2023   EGFR 72.0 10/13/2022   Lab Results  Component Value Date   CHOL 134 03/16/2022   Lab Results  Component Value Date   HDL 57 03/16/2022   Lab Results  Component Value Date   LDLCALC 64 03/16/2022   Lab Results  Component Value Date   TRIG 59 03/16/2022   Lab Results  Component Value Date   CHOLHDL 2.4 03/16/2022   Lab Results  Component Value Date   HGBA1C 5.1 05/23/2017      Assessment & Plan:   Problem List Items Addressed This Visit       Cardiovascular and Mediastinum   Hypertension    BP Readings from Last 1 Encounters:  02/21/23 117/70   Well-controlled with Amlodipine Counseled for compliance with the medications Advised DASH diet and moderate exercise/walking, at least 150 mins/week        Genitourinary   AKI (acute kidney injury) (HCC)    Likely  due to dehydration and/or hypercalcemia Advised to maintain adequate hydration (takes Lasix for leg swelling) Check CMP and Mg        Other   Hypercalcemia - Primary    Was deemed to be due to over supplementation of calcium, has started taking only 1 tablet of Os-Cal with D Hypercalcemia resolved with IV fluids Check CMP      Relevant Orders   CBC with Differential/Platelet   Basic Metabolic Panel (BMET)   Leg swelling    Likely due to chronic venous insufficiency and due to amlodipine Continue Lasix 20 mg BID Advised to take only half tablet of amlodipine 5 mg once daily for now Leg elevation and compression socks as tolerated      Hospital discharge follow-up    Hospital chart reviewed, including discharge summary Medications reconciled and reviewed with the patient in detail      Hypomagnesemia   Relevant Orders   Magnesium   Urge incontinence of urine    Started oxybutynin 10 mg QD Scheduled voiding      Relevant Medications   oxybutynin (DITROPAN XL) 10 MG 24 hr tablet    Meds ordered this encounter  Medications   oxybutynin (DITROPAN XL) 10 MG 24 hr tablet    Sig: Take 1 tablet (10 mg total) by mouth at bedtime.    Dispense:  30 tablet    Refill:  1    Follow-up: Return if symptoms worsen or fail to improve.    [name], MD

## 2023-02-21 NOTE — Assessment & Plan Note (Signed)
Likely due to chronic venous insufficiency and due to amlodipine Continue Lasix 20 mg BID Advised to take only half tablet of amlodipine 5 mg once daily for now Leg elevation and compression socks as tolerated

## 2023-02-21 NOTE — Assessment & Plan Note (Addendum)
Likely due to dehydration and/or hypercalcemia Advised to maintain adequate hydration (takes Lasix for leg swelling) Check CMP and Mg

## 2023-02-21 NOTE — Assessment & Plan Note (Signed)
BP Readings from Last 1 Encounters:  02/21/23 117/70   Well-controlled with Amlodipine Counseled for compliance with the medications Advised DASH diet and moderate exercise/walking, at least 150 mins/week

## 2023-02-21 NOTE — Assessment & Plan Note (Addendum)
Started oxybutynin 10 mg QD Scheduled voiding

## 2023-02-21 NOTE — Patient Outreach (Signed)
Post-Acute Care Coordinator follow up. Per [facility] Ms. Armstead discharged from [facility] skilled nursing facility on 02/11/23. Screening for potential care coordination services as benefit of health plan and  Primary Care Provider.   Per [facility], [facility] is providing home health services. Ms. Purington previously agreed to care coordination services on 02/08/23.  Ms. Kievit previously indicated best contact number for her is [phone].  Referral made for RN care coordination services.   Raiford Noble, MSN, RN,BSN [facility] Post Acute Care Coordinator [phone] (Direct dial)

## 2023-02-21 NOTE — Patient Instructions (Signed)
Please start taking Amlodipine half tablet once daily.  Please continue magnesium and calcium supplement once daily.

## 2023-02-22 NOTE — Progress Notes (Signed)
  Care Coordination   Note   02/22/2023 Name: Kathy [name] MRN: [id number] DOB: 02/22/1941  Kathy [name] is a 82 y.o. year old female who sees [name], MD for primary care. I reached out to Harlin Rain Coppola by phone today to offer care coordination services.  [name] was given information about Care Coordination services today including:   The Care Coordination services include support from the care team which includes your Nurse Coordinator, Clinical Social Worker, or Pharmacist.  The Care Coordination team is here to help remove barriers to the health concerns and goals most important to you. Care Coordination services are voluntary, and the patient may decline or stop services at any time by request to their care team member.   Care Coordination Consent Status: Patient agreed to services and verbal consent obtained.   Follow up plan:  Telephone appointment with care coordination team member scheduled for:  03/02/23  Encounter Outcome:  Pt. Scheduled  [facility] Coordination Care Guide  Direct Dial: [phone]

## 2023-02-24 LAB — MAGNESIUM: Magnesium: 2.1 mg/dL (ref 1.6–2.3)

## 2023-02-24 LAB — BASIC METABOLIC PANEL
BUN: 26 mg/dL (ref 8–27)
CO2: 22 mmol/L (ref 20–29)
Creatinine, Ser: 1.38 mg/dL — ABNORMAL HIGH (ref 0.57–1.00)
Glucose: 79 mg/dL (ref 70–99)
Sodium: 135 mmol/L (ref 134–144)
eGFR: 39 mL/min/{1.73_m2} — ABNORMAL LOW (ref >59)

## 2023-02-25 DIAGNOSIS — M62562 Muscle wasting and atrophy, not elsewhere classified, left lower leg: Secondary | ICD-10-CM | POA: Diagnosis not present

## 2023-02-25 DIAGNOSIS — M6281 Muscle weakness (generalized): Secondary | ICD-10-CM | POA: Diagnosis not present

## 2023-03-01 DIAGNOSIS — M6281 Muscle weakness (generalized): Secondary | ICD-10-CM | POA: Diagnosis not present

## 2023-03-01 DIAGNOSIS — M62562 Muscle wasting and atrophy, not elsewhere classified, left lower leg: Secondary | ICD-10-CM | POA: Diagnosis not present

## 2023-03-02 ENCOUNTER — Ambulatory Visit: Payer: Self-pay | Admitting: *Deleted

## 2023-03-02 ENCOUNTER — Telehealth: Payer: Self-pay | Admitting: Internal Medicine

## 2023-03-02 NOTE — Telephone Encounter (Signed)
Patient calling wanting lab results to be sent over to Dr Lendon Colonel. Please advise Thank you

## 2023-03-02 NOTE — Patient Outreach (Signed)
  Care Coordination   Initial Visit Note   03/02/2023 Name: Kathy [name] MRN: [id number] DOB: Nov 03, 1940  Kathy [name] is a 82 y.o. year old female who sees [name], MD for primary care. I spoke with  Harlin Rain Hoshino by phone today.  What matters to the patients health and wellness today?  Discharged from [facility] on 02/11/23  Still some leg swelling compression salt Hypersalemia dose decrease  Endocrinologist had her on calcium tid  Appetite fair Need kitchen floor redone- hose from washing machine laminate imitation wood bulking up + 4 steps getting in front of home want ramp near side entering kitchen area -safety   Goals Addressed             This Visit's Progress    Home safety management (ramp,bulking flooring)   Not on track    Interventions Today    Flowsheet Row Most Recent Value  Chronic Disease   Chronic disease during today's visit Other  [home safety/repairs]  General Interventions   General Interventions Discussed/Reviewed General Interventions Discussed, Labs, Durable Medical Equipment (DME), [facility], Doctor Visits  Labs --  Collene Mares, vit D -Lab info sent to [facility]]  Doctor Visits Discussed/Reviewed Doctor Visits Discussed, PCP, Specialist  Durable Medical Equipment (DME) BP Cuff  PCP/Specialist Visits Compliance with follow-up visit  Exercise Interventions   Exercise Discussed/Reviewed Exercise Discussed, Physical Activity, Assistive device use and maintanence  Physical Activity Discussed/Reviewed Physical Activity Discussed  Education Interventions   Education Provided Provided Education  Provided Verbal Education On Nutrition, Labs, Mental Health/Coping with Illness, Medication, Community Resources  Labs Reviewed --  [calcium]  Mental Health Interventions   Mental Health Discussed/Reviewed Mental Health Discussed, Coping Strategies  Nutrition Interventions   Nutrition Discussed/Reviewed Nutrition Discussed, Fluid intake,  Decreasing salt  Pharmacy Interventions   Pharmacy Dicussed/Reviewed Pharmacy Topics Discussed, Medications and their functions, Affording Medications  Safety Interventions   Safety Discussed/Reviewed Safety Discussed, Fall Risk  [bulking flooring, no home ramp to assist with getting down 4 steps]              SDOH assessments and interventions completed:  Yes     Care Coordination Interventions:  Yes, provided   Follow up plan: Follow up call scheduled for 04/01/23    Encounter Outcome:  Pt. Visit Completed   Kaytee Taliercio L. Noelle Penner, RN, BSN, CCM [facility] Care Management Community Coordinator Office number [phone]

## 2023-03-02 NOTE — Telephone Encounter (Signed)
Faxed to dr Nickola Major office

## 2023-03-03 ENCOUNTER — Other Ambulatory Visit: Payer: Self-pay | Admitting: Internal Medicine

## 2023-03-03 DIAGNOSIS — F419 Anxiety disorder, unspecified: Secondary | ICD-10-CM

## 2023-03-07 DIAGNOSIS — M6281 Muscle weakness (generalized): Secondary | ICD-10-CM | POA: Diagnosis not present

## 2023-03-07 DIAGNOSIS — M62562 Muscle wasting and atrophy, not elsewhere classified, left lower leg: Secondary | ICD-10-CM | POA: Diagnosis not present

## 2023-03-11 ENCOUNTER — Inpatient Hospital Stay (HOSPITAL_COMMUNITY)
Admission: EM | Admit: 2023-03-11 | Discharge: 2023-03-16 | DRG: 641 | Disposition: A | Discharge status: Skilled Nursing Facility | Payer: 59 | Attending: Family Medicine | Admitting: Family Medicine

## 2023-03-11 ENCOUNTER — Emergency Department (HOSPITAL_COMMUNITY): Payer: 59

## 2023-03-11 ENCOUNTER — Other Ambulatory Visit: Payer: Self-pay

## 2023-03-11 ENCOUNTER — Encounter (HOSPITAL_COMMUNITY): Payer: Self-pay

## 2023-03-11 DIAGNOSIS — Z9181 History of falling: Secondary | ICD-10-CM | POA: Diagnosis not present

## 2023-03-11 DIAGNOSIS — Z9049 Acquired absence of other specified parts of digestive tract: Secondary | ICD-10-CM

## 2023-03-11 DIAGNOSIS — N182 Chronic kidney disease, stage 2 (mild): Secondary | ICD-10-CM | POA: Diagnosis present

## 2023-03-11 DIAGNOSIS — C73 Malignant neoplasm of thyroid gland: Secondary | ICD-10-CM | POA: Diagnosis present

## 2023-03-11 DIAGNOSIS — Z9889 Other specified postprocedural states: Secondary | ICD-10-CM

## 2023-03-11 DIAGNOSIS — R9089 Other abnormal findings on diagnostic imaging of central nervous system: Secondary | ICD-10-CM | POA: Diagnosis not present

## 2023-03-11 DIAGNOSIS — G4700 Insomnia, unspecified: Secondary | ICD-10-CM | POA: Diagnosis not present

## 2023-03-11 DIAGNOSIS — N3281 Overactive bladder: Secondary | ICD-10-CM | POA: Diagnosis not present

## 2023-03-11 DIAGNOSIS — K2100 Gastro-esophageal reflux disease with esophagitis, without bleeding: Secondary | ICD-10-CM | POA: Diagnosis not present

## 2023-03-11 DIAGNOSIS — Z9841 Cataract extraction status, right eye: Secondary | ICD-10-CM

## 2023-03-11 DIAGNOSIS — Z90711 Acquired absence of uterus with remaining cervical stump: Secondary | ICD-10-CM

## 2023-03-11 DIAGNOSIS — D631 Anemia in chronic kidney disease: Secondary | ICD-10-CM | POA: Diagnosis not present

## 2023-03-11 DIAGNOSIS — I959 Hypotension, unspecified: Secondary | ICD-10-CM | POA: Diagnosis not present

## 2023-03-11 DIAGNOSIS — Z96612 Presence of left artificial shoulder joint: Secondary | ICD-10-CM | POA: Diagnosis present

## 2023-03-11 DIAGNOSIS — I480 Paroxysmal atrial fibrillation: Secondary | ICD-10-CM

## 2023-03-11 DIAGNOSIS — M810 Age-related osteoporosis without current pathological fracture: Secondary | ICD-10-CM | POA: Diagnosis present

## 2023-03-11 DIAGNOSIS — G894 Chronic pain syndrome: Secondary | ICD-10-CM | POA: Diagnosis present

## 2023-03-11 DIAGNOSIS — M069 Rheumatoid arthritis, unspecified: Secondary | ICD-10-CM | POA: Diagnosis present

## 2023-03-11 DIAGNOSIS — Z96611 Presence of right artificial shoulder joint: Secondary | ICD-10-CM | POA: Diagnosis present

## 2023-03-11 DIAGNOSIS — G4730 Sleep apnea, unspecified: Secondary | ICD-10-CM | POA: Diagnosis present

## 2023-03-11 DIAGNOSIS — R7989 Other specified abnormal findings of blood chemistry: Secondary | ICD-10-CM | POA: Diagnosis present

## 2023-03-11 DIAGNOSIS — R1313 Dysphagia, pharyngeal phase: Secondary | ICD-10-CM | POA: Diagnosis not present

## 2023-03-11 DIAGNOSIS — I452 Bifascicular block: Secondary | ICD-10-CM | POA: Diagnosis not present

## 2023-03-11 DIAGNOSIS — E8809 Other disorders of plasma-protein metabolism, not elsewhere classified: Secondary | ICD-10-CM | POA: Diagnosis not present

## 2023-03-11 DIAGNOSIS — Z85850 Personal history of malignant neoplasm of thyroid: Secondary | ICD-10-CM

## 2023-03-11 DIAGNOSIS — N179 Acute kidney failure, unspecified: Secondary | ICD-10-CM | POA: Diagnosis not present

## 2023-03-11 DIAGNOSIS — Z96642 Presence of left artificial hip joint: Secondary | ICD-10-CM | POA: Diagnosis not present

## 2023-03-11 DIAGNOSIS — I6782 Cerebral ischemia: Secondary | ICD-10-CM | POA: Diagnosis not present

## 2023-03-11 DIAGNOSIS — S0990XA Unspecified injury of head, initial encounter: Secondary | ICD-10-CM | POA: Diagnosis not present

## 2023-03-11 DIAGNOSIS — F419 Anxiety disorder, unspecified: Secondary | ICD-10-CM | POA: Diagnosis present

## 2023-03-11 DIAGNOSIS — W06XXXA Fall from bed, initial encounter: Secondary | ICD-10-CM | POA: Diagnosis present

## 2023-03-11 DIAGNOSIS — Z833 Family history of diabetes mellitus: Secondary | ICD-10-CM

## 2023-03-11 DIAGNOSIS — Z96643 Presence of artificial hip joint, bilateral: Secondary | ICD-10-CM | POA: Diagnosis present

## 2023-03-11 DIAGNOSIS — Z886 Allergy status to analgesic agent status: Secondary | ICD-10-CM

## 2023-03-11 DIAGNOSIS — E785 Hyperlipidemia, unspecified: Secondary | ICD-10-CM | POA: Diagnosis present

## 2023-03-11 DIAGNOSIS — K5730 Diverticulosis of large intestine without perforation or abscess without bleeding: Secondary | ICD-10-CM | POA: Diagnosis not present

## 2023-03-11 DIAGNOSIS — Z8711 Personal history of peptic ulcer disease: Secondary | ICD-10-CM

## 2023-03-11 DIAGNOSIS — I129 Hypertensive chronic kidney disease with stage 1 through stage 4 chronic kidney disease, or unspecified chronic kidney disease: Secondary | ICD-10-CM | POA: Diagnosis not present

## 2023-03-11 DIAGNOSIS — Z79899 Other long term (current) drug therapy: Secondary | ICD-10-CM

## 2023-03-11 DIAGNOSIS — R54 Age-related physical debility: Secondary | ICD-10-CM | POA: Diagnosis not present

## 2023-03-11 DIAGNOSIS — I1310 Hypertensive heart and chronic kidney disease without heart failure, with stage 1 through stage 4 chronic kidney disease, or unspecified chronic kidney disease: Secondary | ICD-10-CM | POA: Diagnosis not present

## 2023-03-11 DIAGNOSIS — C9000 Multiple myeloma not having achieved remission: Secondary | ICD-10-CM | POA: Diagnosis not present

## 2023-03-11 DIAGNOSIS — Z79890 Hormone replacement therapy (postmenopausal): Secondary | ICD-10-CM

## 2023-03-11 DIAGNOSIS — Z9842 Cataract extraction status, left eye: Secondary | ICD-10-CM

## 2023-03-11 DIAGNOSIS — Z7983 Long term (current) use of bisphosphonates: Secondary | ICD-10-CM

## 2023-03-11 DIAGNOSIS — Z7401 Bed confinement status: Secondary | ICD-10-CM | POA: Diagnosis not present

## 2023-03-11 DIAGNOSIS — E890 Postprocedural hypothyroidism: Secondary | ICD-10-CM | POA: Diagnosis not present

## 2023-03-11 DIAGNOSIS — R634 Abnormal weight loss: Secondary | ICD-10-CM | POA: Diagnosis not present

## 2023-03-11 DIAGNOSIS — D638 Anemia in other chronic diseases classified elsewhere: Secondary | ICD-10-CM | POA: Diagnosis present

## 2023-03-11 DIAGNOSIS — Z888 Allergy status to other drugs, medicaments and biological substances status: Secondary | ICD-10-CM

## 2023-03-11 DIAGNOSIS — R531 Weakness: Secondary | ICD-10-CM | POA: Diagnosis not present

## 2023-03-11 DIAGNOSIS — Z8719 Personal history of other diseases of the digestive system: Secondary | ICD-10-CM

## 2023-03-11 DIAGNOSIS — R079 Chest pain, unspecified: Secondary | ICD-10-CM | POA: Diagnosis not present

## 2023-03-11 DIAGNOSIS — I4891 Unspecified atrial fibrillation: Secondary | ICD-10-CM | POA: Diagnosis not present

## 2023-03-11 DIAGNOSIS — Z8249 Family history of ischemic heart disease and other diseases of the circulatory system: Secondary | ICD-10-CM

## 2023-03-11 DIAGNOSIS — E876 Hypokalemia: Secondary | ICD-10-CM | POA: Diagnosis not present

## 2023-03-11 DIAGNOSIS — Z96652 Presence of left artificial knee joint: Secondary | ICD-10-CM | POA: Diagnosis present

## 2023-03-11 DIAGNOSIS — Z801 Family history of malignant neoplasm of trachea, bronchus and lung: Secondary | ICD-10-CM

## 2023-03-11 DIAGNOSIS — Z602 Problems related to living alone: Secondary | ICD-10-CM | POA: Diagnosis present

## 2023-03-11 DIAGNOSIS — S299XXA Unspecified injury of thorax, initial encounter: Secondary | ICD-10-CM | POA: Diagnosis not present

## 2023-03-11 DIAGNOSIS — I10 Essential (primary) hypertension: Secondary | ICD-10-CM | POA: Diagnosis not present

## 2023-03-11 DIAGNOSIS — K284 Chronic or unspecified gastrojejunal ulcer with hemorrhage: Secondary | ICD-10-CM

## 2023-03-11 DIAGNOSIS — K219 Gastro-esophageal reflux disease without esophagitis: Secondary | ICD-10-CM | POA: Diagnosis not present

## 2023-03-11 DIAGNOSIS — Z9884 Bariatric surgery status: Secondary | ICD-10-CM

## 2023-03-11 DIAGNOSIS — K8689 Other specified diseases of pancreas: Secondary | ICD-10-CM | POA: Diagnosis not present

## 2023-03-11 DIAGNOSIS — R6889 Other general symptoms and signs: Secondary | ICD-10-CM | POA: Diagnosis not present

## 2023-03-11 LAB — RAPID URINE DRUG SCREEN, HOSP PERFORMED
Amphetamines: NOT DETECTED
Barbiturates: NOT DETECTED
Benzodiazepines: NOT DETECTED
Cocaine: NOT DETECTED
Opiates: NOT DETECTED
Tetrahydrocannabinol: NOT DETECTED

## 2023-03-11 LAB — CBC WITH DIFFERENTIAL/PLATELET
Abs Immature Granulocytes: 0.03 10*3/uL (ref 0.00–0.07)
Basophils Absolute: 0 10*3/uL (ref 0.0–0.1)
Basophils Relative: 0 %
Eosinophils Absolute: 0 10*3/uL (ref 0.0–0.5)
Eosinophils Relative: 0 %
HCT: 25.5 % — ABNORMAL LOW (ref 36.0–46.0)
Hemoglobin: 8.4 g/dL — ABNORMAL LOW (ref 12.0–15.0)
Immature Granulocytes: 0 %
Lymphocytes Relative: 20 %
Lymphs Abs: 1.7 10*3/uL (ref 0.7–4.0)
MCH: 33.3 pg (ref 26.0–34.0)
MCHC: 32.9 g/dL (ref 30.0–36.0)
MCV: 101.2 fL — ABNORMAL HIGH (ref 80.0–100.0)
Monocytes Absolute: 0.9 10*3/uL (ref 0.1–1.0)
Monocytes Relative: 11 %
Neutro Abs: 5.7 10*3/uL (ref 1.7–7.7)
Neutrophils Relative %: 69 %
Platelets: 191 10*3/uL (ref 150–400)
RBC: 2.52 MIL/uL — ABNORMAL LOW (ref 3.87–5.11)
RDW: 16.3 % — ABNORMAL HIGH (ref 11.5–15.5)
WBC: 8.3 10*3/uL (ref 4.0–10.5)
nRBC: 0.7 % — ABNORMAL HIGH (ref 0.0–0.2)

## 2023-03-11 LAB — URINALYSIS, ROUTINE W REFLEX MICROSCOPIC
Bilirubin Urine: NEGATIVE
Glucose, UA: NEGATIVE mg/dL
Hgb urine dipstick: NEGATIVE
Ketones, ur: NEGATIVE mg/dL
Leukocytes,Ua: NEGATIVE
Nitrite: NEGATIVE
Protein, ur: NEGATIVE mg/dL
Specific Gravity, Urine: 1.006 (ref 1.005–1.030)
pH: 7 (ref 5.0–8.0)

## 2023-03-11 LAB — COMPREHENSIVE METABOLIC PANEL
ALT: 20 U/L (ref 0–44)
AST: 42 U/L — ABNORMAL HIGH (ref 15–41)
Albumin: 2 g/dL — ABNORMAL LOW (ref 3.5–5.0)
Alkaline Phosphatase: 66 U/L (ref 38–126)
Anion gap: 12 (ref 5–15)
BUN: 33 mg/dL — ABNORMAL HIGH (ref 8–23)
CO2: 23 mmol/L (ref 22–32)
Calcium: 14.2 mg/dL (ref 8.9–10.3)
Chloride: 100 mmol/L (ref 98–111)
Creatinine, Ser: 1.88 mg/dL — ABNORMAL HIGH (ref 0.44–1.00)
GFR, Estimated: 27 mL/min — ABNORMAL LOW (ref >60)
Glucose, Bld: 89 mg/dL (ref 70–99)
Potassium: 5 mmol/L (ref 3.5–5.1)
Sodium: 135 mmol/L (ref 135–145)
Total Bilirubin: 0.6 mg/dL (ref 0.3–1.2)
Total Protein: 11.7 g/dL — ABNORMAL HIGH (ref 6.5–8.1)

## 2023-03-11 LAB — TROPONIN I (HIGH SENSITIVITY)
Troponin I (High Sensitivity): 210 ng/L (ref <18)
Troponin I (High Sensitivity): 232 ng/L (ref <18)

## 2023-03-11 LAB — LACTIC ACID, PLASMA
Lactic Acid, Venous: 1.6 mmol/L (ref 0.5–1.9)
Lactic Acid, Venous: 0.8 mmol/L (ref 0.5–1.9)

## 2023-03-11 LAB — LIPASE, BLOOD: Lipase: 195 U/L — ABNORMAL HIGH (ref 11–51)

## 2023-03-11 MED ORDER — ALPRAZOLAM 0.5 MG PO TABS
0.2500 mg | ORAL_TABLET | Freq: Every evening | ORAL | Status: DC | PRN
  Administered 2023-03-13 – 2023-03-15 (×3): 0.25 mg via ORAL
  Filled 2023-03-11 (×5): qty 1

## 2023-03-11 MED ORDER — ASPIRIN 325 MG PO TABS
325.0000 mg | ORAL_TABLET | Freq: Once | ORAL | Status: DC
  Filled 2023-03-11: qty 1

## 2023-03-11 MED ORDER — OXYBUTYNIN CHLORIDE ER 5 MG PO TB24
10.0000 mg | ORAL_TABLET | Freq: Every day | ORAL | Status: DC
  Administered 2023-03-12 – 2023-03-15 (×4): 10 mg via ORAL
  Filled 2023-03-11 (×4): qty 2

## 2023-03-11 MED ORDER — ONDANSETRON HCL 4 MG/2ML IJ SOLN
4.0000 mg | Freq: Four times a day (QID) | INTRAMUSCULAR | Status: DC | PRN
  Filled 2023-03-11: qty 2

## 2023-03-11 MED ORDER — CALCITONIN (SALMON) 200 UNIT/ML IJ SOLN
4.0000 [IU]/kg | INTRAMUSCULAR | Status: AC
  Administered 2023-03-11: 284 [IU] via INTRAMUSCULAR
  Filled 2023-03-11: qty 1.42
  Filled 2023-03-11: qty 2

## 2023-03-11 MED ORDER — SODIUM CHLORIDE 0.9 % IV SOLN: Freq: Once | INTRAVENOUS | Status: AC

## 2023-03-11 MED ORDER — ZOLEDRONIC ACID 5 MG/100ML IV SOLN: 4.0000 mg | Freq: Once | INTRAVENOUS | Status: DC

## 2023-03-11 MED ORDER — FERROUS SULFATE 325 (65 FE) MG PO TABS
325.0000 mg | ORAL_TABLET | Freq: Every day | ORAL | Status: DC
  Administered 2023-03-14 – 2023-03-16 (×3): 325 mg via ORAL
  Filled 2023-03-11 (×4): qty 1

## 2023-03-11 MED ORDER — ONDANSETRON HCL 4 MG PO TABS
4.0000 mg | ORAL_TABLET | Freq: Four times a day (QID) | ORAL | Status: DC | PRN
  Administered 2023-03-15: 4 mg via ORAL
  Filled 2023-03-11: qty 1

## 2023-03-11 MED ORDER — ACETAMINOPHEN 500 MG PO TABS: 500.0000 mg | ORAL_TABLET | Freq: Four times a day (QID) | ORAL | Status: DC | PRN

## 2023-03-11 MED ORDER — ZOLEDRONIC ACID 4 MG/100ML IV SOLN
4.0000 mg | Freq: Once | INTRAVENOUS | Status: AC
  Administered 2023-03-12: 4 mg via INTRAVENOUS
  Filled 2023-03-11: qty 100

## 2023-03-11 MED ORDER — HYDROXYCHLOROQUINE SULFATE 200 MG PO TABS: 200.0000 mg | ORAL_TABLET | Freq: Two times a day (BID) | ORAL | Status: DC

## 2023-03-11 MED ORDER — GADOBUTROL 1 MMOL/ML IV SOLN
7.0000 mL | Freq: Once | INTRAVENOUS | Status: AC | PRN
  Administered 2023-03-11: 7 mL via INTRAVENOUS

## 2023-03-11 MED ORDER — ACETAMINOPHEN 650 MG RE SUPP: 650.0000 mg | Freq: Four times a day (QID) | RECTAL | Status: DC | PRN

## 2023-03-11 MED ORDER — SODIUM CHLORIDE 0.9 % IV SOLN
INTRAVENOUS | Status: DC
  Administered 2023-03-12: 1000 mL via INTRAVENOUS

## 2023-03-11 MED ORDER — LACTATED RINGERS IV BOLUS
1000.0000 mL | Freq: Once | INTRAVENOUS | Status: AC
  Administered 2023-03-11: 1000 mL via INTRAVENOUS

## 2023-03-11 MED ORDER — IOHEXOL 300 MG/ML  SOLN
75.0000 mL | Freq: Once | INTRAMUSCULAR | Status: AC | PRN
  Administered 2023-03-11: 75 mL via INTRAVENOUS

## 2023-03-11 MED ORDER — SORBITOL 70 % SOLN: 30.0000 mL | Freq: Every day | Status: DC | PRN

## 2023-03-11 MED ORDER — PANTOPRAZOLE SODIUM 40 MG PO TBEC
40.0000 mg | DELAYED_RELEASE_TABLET | Freq: Every day | ORAL | Status: DC
  Administered 2023-03-11 – 2023-03-16 (×6): 40 mg via ORAL
  Filled 2023-03-11 (×6): qty 1

## 2023-03-11 NOTE — ED Notes (Signed)
Pt is currently getting a CT then MRI

## 2023-03-11 NOTE — H&P (Signed)
History and Physical    Patient: Kathy [name] ZYS:[id number] DOB: February 14, 1941 DOA: 03/11/2023 DOS: the patient was seen and examined on 03/11/2023 PCP: [name], MD  Patient coming from: Home  Chief Complaint:  Chief Complaint  Patient presents with   Fall    Had fall this morning around 0500   HPI: Kathy [name] is a 82 y.o. female with medical history significant for hypercalcemia and AKI, rheumatoid arthritis, GI bleed secondary to gastrojejunal ulcer in 2023, new onset atrial fibrillation in July 2024 now on Eliquis.  The patient was hospitalized for hypercalcemia in July 2024. She was on 1500 mg of calcium for supplementation every day.  After her calcium was corrected she was discharged on 500 mg of calcium daily and Eliquis for the A-fib.  She followed up with her primary care physician and 2 weeks ago her calcium was noted to be 10.  She is compliant with her medication.  This morning about 5 AM the patient slid out of the bed onto the floor and could not get up.  She was eventually able to call a neighbor who came down helped her and then called EMS.  Per the ED provider the patient was a little confused.  By the time of my evaluation she was able to give me a fairly good history.  She does have chronic pain from her rheumatoid arthritis and walks with a cane but she does live alone and is independent of her ADLs. After the patient's hospitalization in July she did go to rehab before going back home. The patient's thyroid cancer was in 2006.  She sees an endocrinologist and has a yearly thyroid scan.  Her endocrinologist also manages her supplementation.   Review of Systems: As mentioned in the history of present illness. All other systems reviewed and are negative. Past Medical History:  Diagnosis Date   Abnormal findings on diagnostic imaging of heart and coronary circulation    Anemia    FROM BLEEDING ULCER   Anxiety    takes Alprazolam daily as needed   Arthritis     dx with RA 2017   Bariatric surgery status    Cataract    Diverticulosis    Gastro-esophageal reflux disease with esophagitis    Gastrojejunal ulcer with hemorrhage    Headache(784.0)    occasionally   History of blood transfusion    no abnormal reaction noted   History of bronchitis    last time many yrs ago   Hyperlipidemia    PT DENIES THIS DX -  ON NO MEDS AND NO ONE HAS TOLD HER   Hypertension    takes Amlodipine daily   Hypothyroidism    takes Synthroid daily   Insomnia    Joint pain    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   Localized edema    Nocturia    Numbness    occasionally left arm at night   Obesity    Osteoporosis    takes Fosamax weekly   Pain in joint involving pelvic region and thigh    Peripheral edema    takes Lasix daily as needed   PONV (postoperative nausea and vomiting)    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/2017   Rheumatoid arthritis, unspecified (HCC)    Rotator cuff arthropathy, 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   Sleep apnea    [employer]reesia 08/22/2020   Stomach ulcer    Thyroid disease    [employer] 08/22/2020   Unspecified injury of muscle(s)  and tendon(s) of the rotator cuff of left shoulder, subsequent encounter    Unspecified osteoarthritis, unspecified site    Wears glasses    Wears partial dentures    Past Surgical History:  Procedure Laterality Date   ABDOMINAL HYSTERECTOMY     partial   cataract surgery Bilateral    CHOLECYSTECTOMY     COLONOSCOPY     ESOPHAGOGASTRODUODENOSCOPY (EGD) WITH PROPOFOL N/A 01/11/2022   Procedure: ESOPHAGOGASTRODUODENOSCOPY (EGD) WITH PROPOFOL;  Surgeon: Iva Boop, MD;  Location: [facility] ENDOSCOPY;  Service: Gastroenterology;  Laterality: N/A;   EYE SURGERY     CATARACTS BOTH   gastric bypass surgery     HOT HEMOSTASIS N/A 01/11/2022   Procedure: HOT HEMOSTASIS (ARGON PLASMA COAGULATION/BICAP);  Surgeon: Iva Boop, MD;  Location: [facility] ENDOSCOPY;  Service: Gastroenterology;   Laterality: N/A;   JOINT REPLACEMENT Bilateral    hip   REVERSE SHOULDER ARTHROPLASTY Right 05/11/2016   Procedure: REVERSE SHOULDER ARTHROPLASTY;  Surgeon: Teryl Lucy, MD;  Location: [facility] OR;  Service: Orthopedics;  Laterality: Right;   REVISION TOTAL HIP ARTHROPLASTY Left 10/03/2013   DR Turner Daniels   ROTATOR CUFF REPAIR     SCLEROTHERAPY  01/11/2022   Procedure: Susa Day;  Surgeon: Iva Boop, MD;  Location: [facility] ENDOSCOPY;  Service: Gastroenterology;;   TOTAL HIP REVISION Left 10/03/2013   Procedure: TOTAL HIP REVISION- left;  Surgeon: Nestor Lewandowsky, MD;  Location: [facility] OR;  Service: Orthopedics;  Laterality: Left;   TOTAL KNEE ARTHROPLASTY Left 11/29/2017   Procedure: LEFT TOTAL KNEE ARTHROPLASTY;  Surgeon: Teryl Lucy, MD;  Location: [facility] OR;  Service: Orthopedics;  Laterality: Left;   TOTAL SHOULDER ARTHROPLASTY Left 11/09/2016   Procedure: TOTAL REVERSE SHOULDER ARTHROPLASTY;  Surgeon: Teryl Lucy, MD;  Location: [facility] OR;  Service: Orthopedics;  Laterality: Left;   TOTAL SHOULDER REPLACEMENT Left 10/2016   TOTAL THYROIDECTOMY     UPPER GASTROINTESTINAL ENDOSCOPY     Social History:  reports that she has never smoked. She has never been exposed to tobacco smoke. She has never used smokeless tobacco. She reports that she does not drink alcohol and does not use drugs.  Allergies  Allergen Reactions   Asa [Aspirin] Nausea And Vomiting and Other (See Comments)    GI intolerance Hx of stomach ulcers   Macrobid [Nitrofurantoin] Other (See Comments)    Stomach pain    Family History  Problem Relation Age of Onset   Diabetes Mother    Congestive Heart Failure Mother    Lung cancer Father    Thrombosis Sister    CAD Brother        CABG   Colon cancer Neg Hx    Esophageal cancer Neg Hx    Stomach cancer Neg Hx    Rectal cancer Neg Hx     Prior to Admission medications   Medication Sig Start Date End Date Taking? Authorizing Provider  acetaminophen (TYLENOL) 650 MG CR  tablet Take 650 mg by mouth at bedtime.    [provider]  Adalimumab (HUMIRA PEN) 40 MG/0.4ML PNKT Inject 40 mg into the skin every Saturday.    [provider]  ALPRAZolam Prudy Feeler) 0.25 MG tablet TAKE 1 TABLET BY MOUTH TWICE DAILY AS NEEDED FOR ANXIETY OR SLEEP 03/03/23   Billie Lade, MD  amLODipine (NORVASC) 5 MG tablet Take 1 tablet (5 mg total) by mouth daily. 07/19/22   [name], MD  calcium-vitamin D (OSCAL WITH D) 500-5 MG-MCG tablet Take 1 tablet by mouth  daily with breakfast. 01/31/23   Sherryll Burger, Pratik D, DO  denosumab (PROLIA) 60 MG/ML SOSY injection inject 60mg  Subcutaneous every 6 months 180 days 10/07/22     diclofenac Sodium (VOLTAREN) 1 % GEL APPLY (1) GRAM TO AFFECTED AREA DAILY. Patient taking differently: Apply 1 g topically daily. 08/11/22   [name], MD  ferrous sulfate 325 (65 FE) MG EC tablet Take 1 tablet (325 mg total) by mouth daily. 03/17/22   Iva Boop, MD  furosemide (LASIX) 20 MG tablet Take 1 tablet (20 mg total) by mouth 2 (two) times daily. Patient taking differently: Take 20 mg by mouth daily as needed for fluid. 03/16/22   [name], MD  hydroxychloroquine (PLAQUENIL) 200 MG tablet Take 200 mg by mouth 2 (two) times daily.    [provider]  levothyroxine (SYNTHROID) 88 MCG tablet Take 88 mcg by mouth daily before breakfast.    [provider]  Multiple Vitamin (MULTIVITAMIN WITH MINERALS) TABS tablet Take 1 tablet by mouth every morning.    [provider]  naloxone [facility]) nasal spray 4 mg/0.1 mL Place 1 spray into the nose as needed (opioid reversal).    [provider]  Omega-3 Fatty Acids (FISH OIL PO) Take 1 capsule by mouth daily.    [provider]  omeprazole (PRILOSEC) 40 MG capsule Take 1 capsule (40 mg total) by mouth daily before breakfast. Open capsule and swallow granules with liquid or applesauce 04/15/22   Iva Boop, MD  oxybutynin (DITROPAN XL) 10 MG 24 hr  tablet Take 1 tablet (10 mg total) by mouth at bedtime. 02/21/23   [name], MD  oxyCODONE-acetaminophen (PERCOCET/ROXICET) 5-325 MG tablet Take 1 tablet by mouth every 8 (eight) hours as needed (pain not relieved by tramadol). 01/28/23   Sherryll Burger, Pratik D, DO  potassium chloride SA (KLOR-CON M) 20 MEQ tablet Take 1 tablet (20 mEq total) by mouth daily. TAKE 1 TABLET (20 MEQ) BY MOUTH DAILY PRN with Lasix (EDEMA) 01/31/23   Sherryll Burger, Pratik D, DO  traMADol (ULTRAM) 50 MG tablet Take 1 tablet (50 mg total) by mouth daily as needed for moderate pain. 01/28/23   Maurilio Lovely D, DO    Physical Exam: Vitals:   03/11/23 1209 03/11/23 1250 03/11/23 1415 03/11/23 1651  BP:  125/65 128/72   Pulse: 98  87   Resp:  16 14   Temp:  (!) 97.5 F (36.4 C)  98.1 F (36.7 C)  TempSrc:  Oral  Oral  SpO2: 100%  98%   Weight:      Height:       Physical Exam:  General: Ill appearing, frail, dehydrated. HEENT: Normocephalic, atraumatic, Pupils pinpoint Cardiovascular: Normal rate and rhythm. Distal pulses intact. Pulmonary: Normal pulmonary effort, normal breath sounds Gastrointestinal: Nondistended abdomen, soft, non-tender, hypoactive bowel sounds, full Musculoskeletal:No lower ext edema. Ulner deviation from RA Skin: Skin is warm and dry.  Tattoo on right lower leg - outer shin Neuro: No focal deficits noted, AAOx3, but slow to answer questions PSYCH: Attentive and cooperative  Data Reviewed:  Results for orders placed or performed during the hospital encounter of 03/11/23 (from the past 24 hour(s))  Urinalysis, Routine w reflex microscopic -Urine, Clean Catch     Status: Abnormal   Collection Time: 03/11/23 11:33 AM  Result Value Ref Range   Color, Urine STRAW (A) YELLOW   APPearance HAZY (A) CLEAR   Specific Gravity, Urine 1.006 1.005 - 1.030   pH  7.0 5.0 - 8.0   Glucose, UA NEGATIVE NEGATIVE mg/dL   Hgb urine dipstick NEGATIVE NEGATIVE   Bilirubin Urine NEGATIVE NEGATIVE   Ketones, ur  NEGATIVE NEGATIVE mg/dL   Protein, ur NEGATIVE NEGATIVE mg/dL   Nitrite NEGATIVE NEGATIVE   Leukocytes,Ua NEGATIVE NEGATIVE  Urine rapid drug screen (hosp performed)     Status: None   Collection Time: 03/11/23 11:33 AM  Result Value Ref Range   Opiates NONE DETECTED NONE DETECTED   Cocaine NONE DETECTED NONE DETECTED   Benzodiazepines NONE DETECTED NONE DETECTED   Amphetamines NONE DETECTED NONE DETECTED   Tetrahydrocannabinol NONE DETECTED NONE DETECTED   Barbiturates NONE DETECTED NONE DETECTED  Comprehensive metabolic panel     Status: Abnormal   Collection Time: 03/11/23 11:56 AM  Result Value Ref Range   Sodium 135 135 - 145 mmol/L   Potassium 5.0 3.5 - 5.1 mmol/L   Chloride 100 98 - 111 mmol/L   CO2 23 22 - 32 mmol/L   Glucose, Bld 89 70 - 99 mg/dL   BUN 33 (H) 8 - 23 mg/dL   Creatinine, Ser 2.59 (H) 0.44 - 1.00 mg/dL   Calcium 56.3 (HH) 8.9 - 10.3 mg/dL   Total Protein 87.5 (H) 6.5 - 8.1 g/dL   Albumin 2.0 (L) 3.5 - 5.0 g/dL   AST 42 (H) 15 - 41 U/L   ALT 20 0 - 44 U/L   Alkaline Phosphatase 66 38 - 126 U/L   Total Bilirubin 0.6 0.3 - 1.2 mg/dL   GFR, Estimated 27 (L) >60 mL/min   Anion gap 12 5 - 15  Troponin I (High Sensitivity)     Status: Abnormal   Collection Time: 03/11/23 11:56 AM  Result Value Ref Range   Troponin I (High Sensitivity) 210 (HH) <18 ng/L  Lactic acid, plasma     Status: None   Collection Time: 03/11/23 11:56 AM  Result Value Ref Range   Lactic Acid, Venous 1.6 0.5 - 1.9 mmol/L  Lactic acid, plasma     Status: None   Collection Time: 03/11/23  1:33 PM  Result Value Ref Range   Lactic Acid, Venous 0.8 0.5 - 1.9 mmol/L  CBC with Differential/Platelet     Status: Abnormal   Collection Time: 03/11/23  1:33 PM  Result Value Ref Range   WBC 8.3 4.0 - 10.5 K/uL   RBC 2.52 (L) 3.87 - 5.11 MIL/uL   Hemoglobin 8.4 (L) 12.0 - 15.0 g/dL   HCT 64.3 (L) 32.9 - 51.8 %   MCV 101.2 (H) 80.0 - 100.0 fL   MCH 33.3 26.0 - 34.0 pg   MCHC 32.9 30.0 - 36.0  g/dL   RDW 84.1 (H) 66.0 - 63.0 %   Platelets 191 150 - 400 K/uL   nRBC 0.7 (H) 0.0 - 0.2 %   Neutrophils Relative % 69 %   Neutro Abs 5.7 1.7 - 7.7 K/uL   Lymphocytes Relative 20 %   Lymphs Abs 1.7 0.7 - 4.0 K/uL   Monocytes Relative 11 %   Monocytes Absolute 0.9 0.1 - 1.0 K/uL   Eosinophils Relative 0 %   Eosinophils Absolute 0.0 0.0 - 0.5 K/uL   Basophils Relative 0 %   Basophils Absolute 0.0 0.0 - 0.1 K/uL   Immature Granulocytes 0 %   Abs Immature Granulocytes 0.03 0.00 - 0.07 K/uL  Troponin I (High Sensitivity)     Status: Abnormal   Collection Time: 03/11/23  1:33 PM  Result Value Ref  Range   Troponin I (High Sensitivity) 232 (HH) <18 ng/L  Lipase, blood     Status: Abnormal   Collection Time: 03/11/23  6:35 PM  Result Value Ref Range   Lipase 195 (H) 11 - 51 U/L    Head CT IMPRESSION: 1. No CT evidence of intracranial injury 2. Multiple scattered lucent calvarial lesions, some of which are new compared to 2018. While nonspecific, differential considerations include metastatic disease or multiple myeloma. Recommend correlation with clinical history and consider further evaluation with a contrast-enhanced MRI of the brain.   Brain MRI IMPRESSION: 1. No evidence of intracranial metastatic disease or other acute intracranial pathology. 2. Numerous calvarial lesions as seen on CT are primarily T1 hyperintense which is more typical of benign lesions, with metastatic disease considered less likely.  Assessment and Plan: Hypercalcemia, recurrent/ Abnormal CT - Hydration overnight.  Lasix as needed.  - IV Bisphosphonate once hydrated. - Consider oncology consultation.  I doubt that 500 mg daily is enough to cause this calcium of 14. Her Calcium was 10 two weeks ago. - Check vitamin D level, PTH, and TSH -Send SPEP and UPEP given multiple boney lesions  2.  AKI -IV fluids and monitor  3.  S/p fall - Check CK.  PT/OT.  The patient lives alone.  4.  History of thyroid  cancer in 2006.  She follows with Dr. Horald Pollen of endocrinology. Who manages her supplements.  5. Rheumatoid Arthritis - Continue Plaquenil and pain medication as needed.    Advance Care Planning:   Code Status: Prior the patient wants to be full code and names her brother Jonny Ruiz Postiglione as her Social research officer, government.  Consults: Consider oncology  Family Communication: none  Severity of Illness: The appropriate patient status for this patient is INPATIENT. Inpatient status is judged to be reasonable and necessary in order to provide the required intensity of service to ensure the patient's safety. The patient's presenting symptoms, physical exam findings, and initial radiographic and laboratory data in the context of their chronic comorbidities is felt to place them at high risk for further clinical deterioration. Furthermore, it is not anticipated that the patient will be medically stable for discharge from the hospital within 2 midnights of admission.   * I certify that at the point of admission it is my clinical judgment that the patient will require inpatient hospital care spanning beyond 2 midnights from the point of admission due to high intensity of service, high risk for further deterioration and high frequency of surveillance required.*  Author: Buena Irish, MD 03/11/2023 6:23 PM  For on call review [email or web address].

## 2023-03-11 NOTE — ED Notes (Signed)
Pt urinated and missed bedpan. Will attempt to get urine after fluids.

## 2023-03-11 NOTE — ED Provider Notes (Signed)
  Physical Exam  BP 128/72   Pulse 87   Temp (!) 97.5 F (36.4 C) (Oral)   Resp 14   Ht 5\' 1"  (1.549 m)   Wt 70.8 kg   SpO2 98%   BMI 29.48 kg/m   Physical Exam  Procedures  Procedures  ED Course / MDM   Clinical Course as of 03/11/23 1518  Fri Mar 11, 2023  1507 Altered this morning. Hpyercalcemic with elevated troponin as well. Discussed with dr Charlesetta Ivory bc EKG is changes. Ct head shows possible mets vs multiple myeloma lesions. Getting chest abdomen and pelvis to figure out primary. Getting mri brain as well. Hx of thyroid cancer in remission. In June had hypercalcemia thought to be due to exogenous use which was decreased. Has an Aki as well. Will admit after ct.  [RP]    Clinical Course User Index [RP] Rondel Baton, MD   Medical Decision Making Amount and/or Complexity of Data Reviewed Labs: ordered. Radiology: ordered.  Risk OTC drugs. Prescription drug management.   ***

## 2023-03-11 NOTE — ED Notes (Signed)
ED TO INPATIENT HANDOFF REPORT  ED Nurse Name and Phone #: Lenox Ponds Name/Age/Gender Kathy [name] 82 y.o. female Room/Bed: APA05/APA05  Code Status   Code Status: Full Code  Home/SNF/Other Home Patient oriented to: self, place, time, and situation Is this baseline? Yes   Triage Complete: Triage complete  Chief Complaint Hypercalcemia [E83.52]  Triage Note Pt had fall this morning and states she hit her head and shoulder. Found by neighbor. Neighbor says she's weaker compared to baseline.   Allergies Allergies  Allergen Reactions   Asa [Aspirin] Nausea And Vomiting and Other (See Comments)    GI intolerance Hx of stomach ulcers   Macrobid [Nitrofurantoin] Other (See Comments)    Stomach pain    Level of Care/Admitting Diagnosis ED Disposition     ED Disposition  Admit   Condition  --   Comment  Hospital Area: [facility] [[id number]]  Level of Care: Telemetry [5]  Covid Evaluation: Asymptomatic - no recent exposure (last 10 days) testing not required  Diagnosis: Hypercalcemia [275.42.ICD-9-CM]  Admitting Physician: Buena Irish [[id number]]  Attending Physician: Buena Irish [phone]  Certification:: I certify this patient will need inpatient services for at least 2 midnights  Estimated Length of Stay: 4          B Medical/Surgery History Past Medical History:  Diagnosis Date   Abnormal findings on diagnostic imaging of heart and coronary circulation    Anemia    FROM BLEEDING ULCER   Anxiety    takes Alprazolam daily as needed   Arthritis    dx with RA 2017   Bariatric surgery status    Cataract    Diverticulosis    Gastro-esophageal reflux disease with esophagitis    Gastrojejunal ulcer with hemorrhage    Headache(784.0)    occasionally   History of blood transfusion    no abnormal reaction noted   History of bronchitis    last time many yrs ago   Hyperlipidemia    PT DENIES THIS DX -  ON NO MEDS AND NO ONE HAS TOLD HER    Hypertension    takes Amlodipine daily   Hypothyroidism    takes Synthroid daily   Insomnia    Joint pain    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   Localized edema    Nocturia    Numbness    occasionally left arm at night   Obesity    Osteoporosis    takes Fosamax weekly   Pain in joint involving pelvic region and thigh    Peripheral edema    takes Lasix daily as needed   PONV (postoperative nausea and vomiting)    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/2017   Rheumatoid arthritis, unspecified (HCC)    Rotator cuff arthropathy, 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   Sleep apnea    Phreesia 08/22/2020   Stomach ulcer    Thyroid disease    [employer]reesia 08/22/2020   Unspecified injury of muscle(s) and tendon(s) of the rotator cuff of left shoulder, subsequent encounter    Unspecified osteoarthritis, unspecified site    Wears glasses    Wears partial dentures    Past Surgical History:  Procedure Laterality Date   ABDOMINAL HYSTERECTOMY     partial   cataract surgery Bilateral    CHOLECYSTECTOMY     COLONOSCOPY     ESOPHAGOGASTRODUODENOSCOPY (EGD) WITH PROPOFOL N/A 01/11/2022   Procedure: ESOPHAGOGASTRODUODENOSCOPY (EGD) WITH PROPOFOL;  Surgeon: Iva Boop, MD;  Location: [facility] ENDOSCOPY;  Service:  Gastroenterology;  Laterality: N/A;   EYE SURGERY     CATARACTS BOTH   gastric bypass surgery     HOT HEMOSTASIS N/A 01/11/2022   Procedure: HOT HEMOSTASIS (ARGON PLASMA COAGULATION/BICAP);  Surgeon: Iva Boop, MD;  Location: [facility] ENDOSCOPY;  Service: Gastroenterology;  Laterality: N/A;   JOINT REPLACEMENT Bilateral    hip   REVERSE SHOULDER ARTHROPLASTY Right 05/11/2016   Procedure: REVERSE SHOULDER ARTHROPLASTY;  Surgeon: Teryl Lucy, MD;  Location: [facility] OR;  Service: Orthopedics;  Laterality: Right;   REVISION TOTAL HIP ARTHROPLASTY Left 10/03/2013   DR Turner Daniels   ROTATOR CUFF REPAIR     SCLEROTHERAPY  01/11/2022   Procedure: Susa Day;  Surgeon: Iva Boop, MD;   Location: [facility] ENDOSCOPY;  Service: Gastroenterology;;   TOTAL HIP REVISION Left 10/03/2013   Procedure: TOTAL HIP REVISION- left;  Surgeon: Nestor Lewandowsky, MD;  Location: [facility] OR;  Service: Orthopedics;  Laterality: Left;   TOTAL KNEE ARTHROPLASTY Left 11/29/2017   Procedure: LEFT TOTAL KNEE ARTHROPLASTY;  Surgeon: Teryl Lucy, MD;  Location: [facility] OR;  Service: Orthopedics;  Laterality: Left;   TOTAL SHOULDER ARTHROPLASTY Left 11/09/2016   Procedure: TOTAL REVERSE SHOULDER ARTHROPLASTY;  Surgeon: Teryl Lucy, MD;  Location: [facility] OR;  Service: Orthopedics;  Laterality: Left;   TOTAL SHOULDER REPLACEMENT Left 10/2016   TOTAL THYROIDECTOMY     UPPER GASTROINTESTINAL ENDOSCOPY       A IV Location/Drains/Wounds Patient Lines/Drains/Airways Status     Active Line/Drains/Airways     Name Placement date Placement time Site Days   Peripheral IV 03/11/23 22 G Posterior;Right Hand 03/11/23  1231  Hand  less than 1            Intake/Output Last 24 hours No intake or output data in the 24 hours ending 03/11/23 2030  Labs/Imaging Results for orders placed or performed during the hospital encounter of 03/11/23 (from the past 48 hour(s))  Urinalysis, Routine w reflex microscopic -Urine, Clean Catch     Status: Abnormal   Collection Time: 03/11/23 11:33 AM  Result Value Ref Range   Color, Urine STRAW (A) YELLOW   APPearance HAZY (A) CLEAR   Specific Gravity, Urine 1.006 1.005 - 1.030   pH 7.0 5.0 - 8.0   Glucose, UA NEGATIVE NEGATIVE mg/dL   Hgb urine dipstick NEGATIVE NEGATIVE   Bilirubin Urine NEGATIVE NEGATIVE   Ketones, ur NEGATIVE NEGATIVE mg/dL   Protein, ur NEGATIVE NEGATIVE mg/dL   Nitrite NEGATIVE NEGATIVE   Leukocytes,Ua NEGATIVE NEGATIVE    Comment: Performed at [facility], [address] [place], [place] [address]  Urine rapid drug screen (hosp performed)     Status: None   Collection Time: 03/11/23 11:33 AM  Result Value Ref Range   Opiates NONE DETECTED NONE DETECTED    Cocaine NONE DETECTED NONE DETECTED   Benzodiazepines NONE DETECTED NONE DETECTED   Amphetamines NONE DETECTED NONE DETECTED   Tetrahydrocannabinol NONE DETECTED NONE DETECTED   Barbiturates NONE DETECTED NONE DETECTED    Comment: (NOTE) DRUG SCREEN FOR MEDICAL PURPOSES ONLY.  IF CONFIRMATION IS NEEDED FOR ANY PURPOSE, NOTIFY LAB WITHIN 5 DAYS.  LOWEST DETECTABLE LIMITS FOR URINE DRUG SCREEN Drug Class                     Cutoff (ng/mL) Amphetamine and metabolites    1000 Barbiturate and metabolites    200 Benzodiazepine                 200  Opiates and metabolites        300 Cocaine and metabolites        300 THC                            50 Performed at [facility], [address] [place], [place] [address]   Comprehensive metabolic panel     Status: Abnormal   Collection Time: 03/11/23 11:56 AM  Result Value Ref Range   Sodium 135 135 - 145 mmol/L   Potassium 5.0 3.5 - 5.1 mmol/L   Chloride 100 98 - 111 mmol/L   CO2 23 22 - 32 mmol/L   Glucose, Bld 89 70 - 99 mg/dL    Comment: Glucose reference range applies only to samples taken after fasting for at least 8 hours.   BUN 33 (H) 8 - 23 mg/dL   Creatinine, Ser 6.04 (H) 0.44 - 1.00 mg/dL   Calcium 54.0 (HH) 8.9 - 10.3 mg/dL    Comment: CRITICAL RESULT CALLED TO, READ BACK BY AND VERIFIED WITH SUSAN [id number] 03/11/23 NN   Total Protein 11.7 (H) 6.5 - 8.1 g/dL   Albumin 2.0 (L) 3.5 - 5.0 g/dL   AST 42 (H) 15 - 41 U/L   ALT 20 0 - 44 U/L   Alkaline Phosphatase 66 38 - 126 U/L   Total Bilirubin 0.6 0.3 - 1.2 mg/dL   GFR, Estimated 27 (L) >60 mL/min    Comment: (NOTE) Calculated using the CKD-EPI Creatinine Equation (2021)    Anion gap 12 5 - 15    Comment: Performed at [facility], [address] [place], [place] [address]  Troponin I (High Sensitivity)     Status: Abnormal   Collection Time: 03/11/23 11:56 AM  Result Value Ref Range   Troponin I (High Sensitivity) 210 (HH) <18 ng/L    Comment: CRITICAL RESULT  CALLED TO, READ BACK BY AND VERIFIED WITH SUSAN [id number] 03/11/23 NN (NOTE) Elevated high sensitivity troponin I (hsTnI) values and significant  changes across serial measurements may suggest ACS but many other  chronic and acute conditions are known to elevate hsTnI results.  Refer to the "Links" section for chest pain algorithms and additional  guidance. Performed at [facility], [address], [place], [place] [address]   Lactic acid, plasma     Status: None   Collection Time: 03/11/23 11:56 AM  Result Value Ref Range   Lactic Acid, Venous 1.6 0.5 - 1.9 mmol/L    Comment: Performed at [facility], [address], [place], [place] [address]  Lactic acid, plasma     Status: None   Collection Time: 03/11/23  1:33 PM  Result Value Ref Range   Lactic Acid, Venous 0.8 0.5 - 1.9 mmol/L    Comment: Performed at [facility], [address] [place], [place] [address]  CBC with Differential/Platelet     Status: Abnormal   Collection Time: 03/11/23  1:33 PM  Result Value Ref Range   WBC 8.3 4.0 - 10.5 K/uL   RBC 2.52 (L) 3.87 - 5.11 MIL/uL   Hemoglobin 8.4 (L) 12.0 - 15.0 g/dL   HCT 36.6 (L) 44.0 - 34.7 %   MCV 101.2 (H) 80.0 - 100.0 fL   MCH 33.3 26.0 - 34.0 pg   MCHC 32.9 30.0 - 36.0 g/dL   RDW 42.5 (H) 95.6 - 38.7 %   Platelets 191 150 - 400 K/uL   nRBC 0.7 (H) 0.0 -  0.2 %   Neutrophils Relative % 69 %   Neutro Abs 5.7 1.7 - 7.7 K/uL   Lymphocytes Relative 20 %   Lymphs Abs 1.7 0.7 - 4.0 K/uL   Monocytes Relative 11 %   Monocytes Absolute 0.9 0.1 - 1.0 K/uL   Eosinophils Relative 0 %   Eosinophils Absolute 0.0 0.0 - 0.5 K/uL   Basophils Relative 0 %   Basophils Absolute 0.0 0.0 - 0.1 K/uL   Immature Granulocytes 0 %   Abs Immature Granulocytes 0.03 0.00 - 0.07 K/uL    Comment: Performed at [facility], [address], [place], [place] [address]  Troponin I (High Sensitivity)     Status: Abnormal   Collection Time: 03/11/23  1:33 PM  Result Value Ref Range   Troponin I  (High Sensitivity) 232 (HH) <18 ng/L    Comment: CRITICAL VALUE NOTED. VALUE IS CONSISTENT WITH PREVIOUSLY REPORTED/CALLED VALUE NN (NOTE) Elevated high sensitivity troponin I (hsTnI) values and significant  changes across serial measurements may suggest ACS but many other  chronic and acute conditions are known to elevate hsTnI results.  Refer to the "Links" section for chest pain algorithms and additional  guidance. Performed at [facility], [address] [place], [place] [address]   Lipase, blood     Status: Abnormal   Collection Time: 03/11/23  6:35 PM  Result Value Ref Range   Lipase 195 (H) 11 - 51 U/L    Comment: Performed at [facility], [address] [place], [place] [address]   DG Chest Portable 1 View  Result Date: 03/11/2023 CLINICAL DATA:  Pain after trauma.  Weakness EXAM: PORTABLE CHEST 1 VIEW COMPARISON:  X-ray 01/23/2023 FINDINGS: Enlarged cardiopericardial silhouette with a tortuous and ectatic aorta. No consolidation, pneumothorax or effusion. No edema. Degenerative changes seen along the spine. 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 IMPRESSION: Enlarged cardiopericardial silhouette.  No edema or consolidation. Electronically Signed   By: Karen Kays M.D.   On: 03/11/2023 13:42    Pending Labs Unresulted Labs (From admission, onward)     Start     Ordered   03/12/23 0500  Magnesium  Tomorrow morning,   R        03/11/23 2004   03/12/23 0500  Comprehensive metabolic panel  Tomorrow morning,   R        03/11/23 2004   03/12/23 0500  CBC  Tomorrow morning,   R        03/11/23 2004   03/12/23 0500  TSH  Tomorrow morning,   R        03/11/23 2004   03/12/23 0500  VITAMIN D 25 Hydroxy (Vit-D Deficiency, Fractures)  Tomorrow morning,   R        03/11/23 2004   03/11/23 1746  PTH-related peptide  Once,   URGENT        03/11/23 1745   03/11/23 1737  Calcium, ionized  Once,   STAT        03/11/23 1736   03/11/23 1737  Parathyroid hormone, intact (no Ca)   Once,   URGENT        03/11/23 1736   03/11/23 1133  CBC with Differential  Once,   STAT        03/11/23 1133            Vitals/Pain Today's Vitals   03/11/23 1651 03/11/23 1815 03/11/23 1900 03/11/23 2000  BP:  115/67 115/66   Pulse:  86 83  Resp:  16 12   Temp: 98.1 F (36.7 C)     TempSrc: Oral     SpO2:  95% 100% 100%  Weight:      Height:      PainSc:        Isolation Precautions No active isolations  Medications Medications  aspirin tablet 325 mg (325 mg Oral Patient Refused/Not Given 03/11/23 1414)  Zoledronic Acid (ZOMETA) IVPB 4 mg (has no administration in time range)  0.9 %  sodium chloride infusion (has no administration in time range)  acetaminophen (TYLENOL) tablet 500 mg (has no administration in time range)    Or  acetaminophen (TYLENOL) suppository 650 mg (has no administration in time range)  sorbitol 70 % solution 30 mL (has no administration in time range)  ondansetron (ZOFRAN) tablet 4 mg (has no administration in time range)    Or  ondansetron (ZOFRAN) injection 4 mg (has no administration in time range)  lactated ringers bolus 1,000 mL (0 mLs Intravenous Stopped 03/11/23 1559)  lactated ringers bolus 1,000 mL (1,000 mLs Intravenous New Bag/Given 03/11/23 1554)  iohexol (OMNIPAQUE) 300 MG/ML solution 75 mL (75 mLs Intravenous Contrast Given 03/11/23 1434)  gadobutrol (GADAVIST) 1 MMOL/ML injection 7 mL (7 mLs Intravenous Contrast Given 03/11/23 1529)  0.9 %  sodium chloride infusion (0 mLs Intravenous Stopped 03/11/23 2024)  calcitonin (MIACALCIN) injection 284 Units (284 Units Intramuscular Given 03/11/23 1857)    Mobility non-ambulatory      R Recommendations: See Admitting Provider Note  Report given to:   Additional Notes:

## 2023-03-11 NOTE — Progress Notes (Signed)
Called ICU for an ultrasound IV, charge RN aware.

## 2023-03-11 NOTE — Progress Notes (Signed)
Patient's IV access was out when she came to the floor. Patient had blood on her sheet and gown. IV catheter was intact.

## 2023-03-11 NOTE — ED Provider Notes (Signed)
[place] EMERGENCY DEPARTMENT AT [facility] Provider Note  CSN: [id number] Arrival date & time: 03/11/23 1103  Chief Complaint(s) Fall (Had fall this morning around 0500)  HPI Kathy [name] is a 82 y.o. female with PMH thyroid cancer, HTN, rheumatoid arthritis, hospital admission on 01/23/2023 for symptomatic hypercalcemia thought to be secondary to exogenous calcium supplementation who presents emergency room for evaluation of altered mental status.  History obtained from patient's family members who state that she was in her normal state of health yesterday but overnight has become significant more altered.  She arrives somnolent but able to answer questions.  Denies chest pain, abdominal pain, nausea, vomiting or other systemic symptoms.   Past Medical History Past Medical History:  Diagnosis Date   Abnormal findings on diagnostic imaging of heart and coronary circulation    Anemia    FROM BLEEDING ULCER   Anxiety    takes Alprazolam daily as needed   Arthritis    dx with RA 2017   Bariatric surgery status    Cataract    Diverticulosis    Gastro-esophageal reflux disease with esophagitis    Gastrojejunal ulcer with hemorrhage    Headache(784.0)    occasionally   History of blood transfusion    no abnormal reaction noted   History of bronchitis    last time many yrs ago   Hyperlipidemia    PT DENIES THIS DX -  ON NO MEDS AND NO ONE HAS TOLD HER   Hypertension    takes Amlodipine daily   Hypothyroidism    takes Synthroid daily   Insomnia    Joint pain    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   Localized edema    Nocturia    Numbness    occasionally left arm at night   Obesity    Osteoporosis    takes Fosamax weekly   Pain in joint involving pelvic region and thigh    Peripheral edema    takes Lasix daily as needed   PONV (postoperative nausea and vomiting)    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/2017   Rheumatoid arthritis,  unspecified (HCC)    Rotator cuff arthropathy, 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   Sleep apnea    Phreesia 08/22/2020   Stomach ulcer    Thyroid disease    Phreesia 08/22/2020   Unspecified injury of muscle(s) and tendon(s) of the rotator cuff of left shoulder, subsequent encounter    Unspecified osteoarthritis, unspecified site    Wears glasses    Wears partial dentures    Patient Active Problem List   Diagnosis Date Noted   Leg swelling 02/21/2023   Hospital discharge follow-up 02/21/2023   Hypomagnesemia 02/21/2023   Urge incontinence of urine 02/21/2023   Hypercalcemia 01/23/2023   AKI (acute kidney injury) (HCC) 01/23/2023   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   Hammer toe of left foot 09/15/2022   Left foot pain 09/15/2022   Iron deficiency anemia due to chronic blood loss 07/19/2022   Rectal bleeding 01/21/2022   Gastrojejunal ulcer with hemorrhage    Encounter for general adult medical examination with abnormal findings 09/16/2021   Hypokalemia 09/16/2021   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   Leg edema 03/16/2021   Bilateral impacted cerumen 11/19/2020   Hearing loss 11/19/2020   Malignant tumor of thyroid gland (HCC) 10/08/2020   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   Polyarthritis    Bariatric surgery status  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/2017   Rheumatoid arthritis (HCC) 11/16/2016   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 Authorizing Provider  acetaminophen (TYLENOL) 650 MG CR tablet Take 650 mg by mouth at bedtime.    [provider]  Adalimumab (HUMIRA PEN) 40 MG/0.4ML PNKT Inject 40 mg into the skin every Saturday.    [provider]  ALPRAZolam Prudy Feeler) 0.25 MG tablet TAKE 1 TABLET BY MOUTH TWICE DAILY AS NEEDED FOR ANXIETY OR SLEEP 03/03/23   Billie Lade, MD  amLODipine (NORVASC) 5 MG tablet Take 1 tablet (5 mg total) by mouth daily. 07/19/22   [name], MD  calcium-vitamin D Ruthell Rummage WITH D) 500-5 MG-MCG tablet Take 1 tablet by mouth daily with breakfast. 01/31/23   Sherryll Burger, Pratik D, DO  denosumab (PROLIA) 60 MG/ML SOSY injection inject 60mg  Subcutaneous every 6 months 180 days 10/07/22     diclofenac Sodium (VOLTAREN) 1 % GEL APPLY (1) GRAM TO AFFECTED AREA DAILY. Patient taking differently: Apply 1 g topically daily. 08/11/22   [name], MD  ferrous sulfate 325 (65 FE) MG EC tablet Take 1 tablet (325 mg total) by mouth daily. 03/17/22   Iva Boop, MD  furosemide (LASIX) 20 MG tablet Take 1 tablet (20 mg total) by mouth 2 (two) times daily. Patient taking differently: Take 20 mg by mouth daily as needed for fluid. 03/16/22   [name], MD  hydroxychloroquine (PLAQUENIL) 200 MG tablet Take 200 mg by mouth 2 (two) times daily.    [provider]  levothyroxine (SYNTHROID) 88 MCG tablet Take 88 mcg by mouth daily before breakfast.    [provider]  Multiple Vitamin (MULTIVITAMIN WITH MINERALS) TABS tablet Take 1 tablet by mouth every morning.    [provider]  naloxone [facility]) nasal spray 4 mg/0.1 mL Place 1 spray into the nose as needed (opioid reversal).    [provider]  Omega-3 Fatty Acids (FISH OIL PO) Take 1 capsule by mouth daily.    [provider]  omeprazole (PRILOSEC) 40 MG capsule Take 1 capsule (40 mg total) by mouth daily  before breakfast. Open capsule and swallow granules with liquid or applesauce 04/15/22   Iva Boop, MD  oxybutynin (DITROPAN XL) 10 MG 24 hr tablet Take 1 tablet (10 mg total) by mouth at bedtime. 02/21/23   [name], MD  oxyCODONE-acetaminophen (PERCOCET/ROXICET) 5-325 MG tablet Take 1 tablet by mouth every 8 (eight) hours as needed (pain not relieved by tramadol). 01/28/23   Sherryll Burger, Pratik D, DO  potassium chloride SA (KLOR-CON M) 20 MEQ tablet Take 1 tablet (20 mEq total) by mouth daily. TAKE 1 TABLET (20 MEQ) BY MOUTH DAILY PRN with Lasix (EDEMA) 01/31/23   Sherryll Burger, Pratik D, DO  traMADol (ULTRAM) 50 MG tablet Take 1 tablet (50 mg total) by mouth daily as needed for moderate pain. 01/28/23   Maurilio Lovely D, DO  Past Surgical History Past Surgical History:  Procedure Laterality Date   ABDOMINAL HYSTERECTOMY     partial   cataract surgery Bilateral    CHOLECYSTECTOMY     COLONOSCOPY     ESOPHAGOGASTRODUODENOSCOPY (EGD) WITH PROPOFOL N/A 01/11/2022   Procedure: ESOPHAGOGASTRODUODENOSCOPY (EGD) WITH PROPOFOL;  Surgeon: Iva Boop, MD;  Location: [facility] ENDOSCOPY;  Service: Gastroenterology;  Laterality: N/A;   EYE SURGERY     CATARACTS BOTH   gastric bypass surgery     HOT HEMOSTASIS N/A 01/11/2022   Procedure: HOT HEMOSTASIS (ARGON PLASMA COAGULATION/BICAP);  Surgeon: Iva Boop, MD;  Location: [facility] ENDOSCOPY;  Service: Gastroenterology;  Laterality: N/A;   JOINT REPLACEMENT Bilateral    hip   REVERSE SHOULDER ARTHROPLASTY Right 05/11/2016   Procedure: REVERSE SHOULDER ARTHROPLASTY;  Surgeon: Teryl Lucy, MD;  Location: [facility] OR;  Service: Orthopedics;  Laterality: Right;   REVISION TOTAL HIP ARTHROPLASTY Left 10/03/2013   DR Turner Daniels   ROTATOR CUFF REPAIR     SCLEROTHERAPY  01/11/2022   Procedure: Susa Day;  Surgeon: Iva Boop, MD;  Location: [facility]  ENDOSCOPY;  Service: Gastroenterology;;   TOTAL HIP REVISION Left 10/03/2013   Procedure: TOTAL HIP REVISION- left;  Surgeon: Nestor Lewandowsky, MD;  Location: [facility] OR;  Service: Orthopedics;  Laterality: Left;   TOTAL KNEE ARTHROPLASTY Left 11/29/2017   Procedure: LEFT TOTAL KNEE ARTHROPLASTY;  Surgeon: Teryl Lucy, MD;  Location: [facility] OR;  Service: Orthopedics;  Laterality: Left;   TOTAL SHOULDER ARTHROPLASTY Left 11/09/2016   Procedure: TOTAL REVERSE SHOULDER ARTHROPLASTY;  Surgeon: Teryl Lucy, MD;  Location: [facility] OR;  Service: Orthopedics;  Laterality: Left;   TOTAL SHOULDER REPLACEMENT Left 10/2016   TOTAL THYROIDECTOMY     UPPER GASTROINTESTINAL ENDOSCOPY     Family History Family History  Problem Relation Age of Onset   Diabetes Mother    Congestive Heart Failure Mother    Lung cancer Father    Thrombosis Sister    CAD Brother        CABG   Colon cancer Neg Hx    Esophageal cancer Neg Hx    Stomach cancer Neg Hx    Rectal cancer Neg Hx     Social History Social History   Tobacco Use   Smoking status: Never    Passive exposure: Never   Smokeless tobacco: Never  Vaping Use   Vaping status: Never Used  Substance Use Topics   Alcohol use: No   Drug use: No   Allergies Asa [aspirin] and Macrobid [nitrofurantoin]  Review of Systems Review of Systems  Unable to perform ROS: Mental status change    Physical Exam Vital Signs  I have reviewed the triage vital signs BP 125/65 (BP Location: Left Arm)   Pulse 98   Temp (!) 97.5 F (36.4 C) (Oral)   Resp 16   Ht 5\' 1"  (1.549 m)   Wt 70.8 kg   SpO2 100%   BMI 29.48 kg/m   Physical Exam Vitals and nursing note reviewed.  Constitutional:      General: She is not in acute distress.    Appearance: She is well-developed. She is ill-appearing.  HENT:     Head: Normocephalic and atraumatic.  Eyes:     Conjunctiva/sclera: Conjunctivae normal.  Cardiovascular:     Rate and Rhythm: Normal rate and regular rhythm.      Heart sounds: No murmur heard. Pulmonary:     Effort: Pulmonary effort is normal. No respiratory distress.  Breath sounds: Normal breath sounds.  Abdominal:     Palpations: Abdomen is soft.     Tenderness: There is no abdominal tenderness.  Musculoskeletal:        General: No swelling.     Cervical back: Neck supple.  Skin:    General: Skin is warm and dry.     Capillary Refill: Capillary refill takes less than 2 seconds.  Neurological:     Mental Status: She is alert. She is disoriented.  Psychiatric:        Mood and Affect: Mood normal.     ED Results and Treatments Labs (all labs ordered are listed, but only abnormal results are displayed) Labs Reviewed  COMPREHENSIVE METABOLIC PANEL - Abnormal; Notable for the following components:      Result Value   BUN 33 (*)    Creatinine, Ser 1.88 (*)    Calcium 14.2 (*)    Total Protein 11.7 (*)    Albumin 2.0 (*)    AST 42 (*)    GFR, Estimated 27 (*)    All other components within normal limits  TROPONIN I (HIGH SENSITIVITY) - Abnormal; Notable for the following components:   Troponin I (High Sensitivity) 210 (*)    All other components within normal limits  LACTIC ACID, PLASMA  CBC WITH DIFFERENTIAL/PLATELET  URINALYSIS, ROUTINE W REFLEX MICROSCOPIC  RAPID URINE DRUG SCREEN, HOSP PERFORMED  LACTIC ACID, PLASMA  CBC WITH DIFFERENTIAL/PLATELET  TROPONIN I (HIGH SENSITIVITY)                                                                                                                          Radiology No results found.  Pertinent labs & imaging results that were available during my care of the patient were reviewed by me and considered in my medical decision making (see MDM for details).  Medications Ordered in ED Medications  lactated ringers bolus 1,000 mL (1,000 mLs Intravenous New Bag/Given 03/11/23 1231)                                                                                                                                      Procedures .Critical Care  Performed by: Glendora Score, MD Authorized by: Glendora Score, MD   Critical care provider statement:    Critical care time (minutes):  30   Critical care was necessary to treat or prevent imminent  or life-threatening deterioration of the following conditions:  Cardiac failure and metabolic crisis   Critical care was time spent personally by me on the following activities:  Development of treatment plan with patient or surrogate, discussions with consultants, evaluation of patient's response to treatment, examination of patient, ordering and review of laboratory studies, ordering and review of radiographic studies, ordering and performing treatments and interventions, pulse oximetry, re-evaluation of patient's condition and review of old charts   (including critical care time)  Medical Decision Making / ED Course   This patient presents to the ED for concern of altered mental status, this involves an extensive number of treatment options, and is a complaint that carries with it a high risk of complications and morbidity.  The differential diagnosis includes infection, metabolic/toxic encephalopathy, hypoglycemia, malperfusion, hypoxia, trauma or other intracranial process  MDM: Patient seen in Emergency Department for evaluation of altered mental status.  Physical exam reveals a somnolent appearing patient with dry tacky mucous membranes but is otherwise unremarkable.  She is mildly encephalopathic but able to answer some questions.  Laboratory evaluation with a hemoglobin of 8.4 with an MCV of 101.2, creatinine 1.88, BUN 33 which is an elevation for this patient, significant hypercalcemia to 14.1 which corrects to 15.7 in the setting of her hypoalbuminemia.  Aggressive fluid resuscitation begun.  Patient's initial troponin is 220 and delta troponin fairly flat at 232.  Follow-up ECG showing diffuse ST segment elevation but no reciprocal  depressions and I did speak with the on-call cardiologist Dr. Elson Areas who believes this is likely demand in the setting of her electrolyte derangements and ECG findings are also likely secondary to her severe hypercalcemia.  CT head showing multiple scattered calvarial lesions that may be metastatic lesions in the setting of multiple myeloma.  Chest x-ray with cardiomegaly but no pulmonary edema.  Although previous hypercalcemia was thought to be iatrogenic in the setting of exogenous calcium use, patient's anemia, AKI and now hypercalcemia certainly does fit with multiple myeloma.  Imaging expanded to look for other sources of malignancy and a CT chest abdomen pelvis and MRI brain are pending at time of signout.  Patient will require hospital admission.  Please see provider signout note for continuation of workup.   Additional history obtained: -Additional history obtained from multiple family members -External records from outside source obtained and reviewed including: Chart review including previous notes, labs, imaging, consultation notes   Lab Tests: -I ordered, reviewed, and interpreted labs.   The pertinent results include:   Labs Reviewed  COMPREHENSIVE METABOLIC PANEL - Abnormal; Notable for the following components:      Result Value   BUN 33 (*)    Creatinine, Ser 1.88 (*)    Calcium 14.2 (*)    Total Protein 11.7 (*)    Albumin 2.0 (*)    AST 42 (*)    GFR, Estimated 27 (*)    All other components within normal limits  TROPONIN I (HIGH SENSITIVITY) - Abnormal; Notable for the following components:   Troponin I (High Sensitivity) 210 (*)    All other components within normal limits  LACTIC ACID, PLASMA  CBC WITH DIFFERENTIAL/PLATELET  URINALYSIS, ROUTINE W REFLEX MICROSCOPIC  RAPID URINE DRUG SCREEN, HOSP PERFORMED  LACTIC ACID, PLASMA  CBC WITH DIFFERENTIAL/PLATELET  TROPONIN I (HIGH SENSITIVITY)      EKG   EKG Interpretation Date/Time:  Friday March 11 2023  13:31:12 EDT Ventricular Rate:  90 PR Interval:  165 QRS Duration:  149 QT  Interval:  379 QTC Calculation: 464 R Axis:   -7  Text Interpretation: Sinus rhythm Right bundle branch block Inferiolateral ST elevations but no reciprocal deppressions Confirmed by ,  ([id number]) on 03/11/2023 1:39:54 PM         Imaging Studies ordered: I ordered imaging studies including chest x-ray, CT head I independently visualized and interpreted imaging. I agree with the radiologist interpretation  MRI brain, CT chest abdomen pelvis are pending   Medicines ordered and prescription drug management: Meds ordered this encounter  Medications   lactated ringers bolus 1,000 mL    -I have reviewed the patients home medicines and have made adjustments as needed  Critical interventions Fluid resuscitation, aspirin, cardiology consultation  Consultations Obtained: I requested consultation with the cardiologist on-call,  and discussed lab and imaging findings as well as pertinent plan - they recommend: Troponin trending, aspirin   Cardiac Monitoring: The patient was maintained on a cardiac monitor.  I personally viewed and interpreted the cardiac monitored which showed an underlying rhythm of: NSR  Social Determinants of Health:  Factors impacting patients care include: nonr   Reevaluation: After the interventions noted above, I reevaluated the patient and found that they have :stayed the same  Co morbidities that complicate the patient evaluation  Past Medical History:  Diagnosis Date   Abnormal findings on diagnostic imaging of heart and coronary circulation    Anemia    FROM BLEEDING ULCER   Anxiety    takes Alprazolam daily as needed   Arthritis    dx with RA 2017   Bariatric surgery status    Cataract    Diverticulosis    Gastro-esophageal reflux disease with esophagitis    Gastrojejunal ulcer with hemorrhage    Headache(784.0)    occasionally   History of blood  transfusion    no abnormal reaction noted   History of bronchitis    last time many yrs ago   Hyperlipidemia    PT DENIES THIS DX -  ON NO MEDS AND NO ONE HAS TOLD HER   Hypertension    takes Amlodipine daily   Hypothyroidism    takes Synthroid daily   Insomnia    Joint pain    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   Localized edema    Nocturia    Numbness    occasionally left arm at night   Obesity    Osteoporosis    takes Fosamax weekly   Pain in joint involving pelvic region and thigh    Peripheral edema    takes Lasix daily as needed   PONV (postoperative nausea and vomiting)    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/2017   Rheumatoid arthritis, unspecified (HCC)    Rotator cuff arthropathy, 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   Sleep apnea    Phreesia 08/22/2020   Stomach ulcer    Thyroid disease    Phreesia 08/22/2020   Unspecified injury of muscle(s) and tendon(s) of the rotator cuff of left shoulder, subsequent encounter    Unspecified osteoarthritis, unspecified site    Wears glasses    Wears partial dentures       Dispostion: I considered admission for this patient, and disposition pending completion of imaging studies but will ultimately require admission for severe hypercalcemia.     Final Clinical Impression(s) / ED Diagnoses Final diagnoses:  None     @PCDICTATION @    Glendora Score, MD 03/11/23 1740

## 2023-03-11 NOTE — ED Triage Notes (Signed)
Pt had fall this morning and states she hit her head and shoulder. Found by neighbor. Neighbor says she's weaker compared to baseline.

## 2023-03-12 NOTE — Progress Notes (Signed)
   03/12/23 1231  TOC Brief Assessment  Insurance and Status Reviewed  Patient has primary care physician Yes  Home environment has been reviewed From Home  Prior level of function: Independent  Prior/Current Home Services No current home services  Social Determinants of Health Reivew SDOH reviewed interventions complete  Readmission risk has been reviewed Yes (Yellow)  Transition of care needs no transition of care needs at this time   Pt is active with Oncology treatment. Recent fall. No TOC needs at this time. TOC will follow.

## 2023-03-12 NOTE — Progress Notes (Signed)
PROGRESS NOTE     Kathy [name], is a 82 y.o. female, DOB - 03-Mar-1941, EAV:[id number]  Admit date - 03/11/2023   Admitting Physician Buena Irish, MD  Outpatient Primary MD for the patient is [name], MD  LOS - 1  Chief Complaint  Patient presents with   Fall    Had fall this morning around 0500        Brief Narrative:   82 y.o. female with medical history significant for hypercalcemia and AKI, rheumatoid arthritis, GI bleed secondary to gastrojejunal ulcer in 2023, new onset atrial fibrillation in July 2024 now on Eliquis and recurrent/persistent hypercalcemia readmitted on 03/11/2023 with elevated calcium levels in the setting of exogenous calcium intake the patient with history of parathyroid malignancy in 2006    -Assessment and Plan: 1) persistent hypocalcemia--review of records reviewed WAS NORMAL BACK IN DECEMBER 2023 -Patient was started on calcium supplements earlier this year, initially she took 1500 mg of calcium daily... Recently reduced to 500 mg daily -Serum calcium remains elevated... Especially when adjusted for her low albumin -Her endocrinologist usually manages her calcium -She was also on Prolia until recently -Stop calcium supplementation -Patient received Zometa and calcitonin -Check serial calcium levels  2) generalized weakness and deconditioning/history of falls--in the setting of advanced rheumatoid arthritis and chronic pain syndrome  -prior to admission patient was getting home health physical therapy  3)AKI----acute kidney injury on CKD stage - 2 Creatinine trended down with hydration -renally adjust medications, avoid nephrotoxic agents / dehydration  / hypotension  4)History of thyroid cancer in 2006.  She follows with Dr. Horald Pollen of endocrinology  -TSH is low at 0.089 -Hold PTA levothyroxine as patient appears to be over replaced at this time  5)HTN--- hold amlodipine due to soft BP  Status is: Inpatient   Disposition: The  patient is from: Home              Anticipated d/c is to: Home with [facility]               Anticipated d/c date is: 1 day              Patient currently is not medically stable to d/c. Barriers: Not Clinically Stable-   Code Status :  -  Code Status: Full Code   Family Communication:    NA (patient is alert, awake and coherent)   DVT Prophylaxis  :   - SCDs      Lab Results  Component Value Date   PLT 184 03/12/2023    Inpatient Medications  Scheduled Meds:  aspirin  325 mg Oral Once   [START ON 03/14/2023] ferrous sulfate  325 mg Oral Daily   oxybutynin  10 mg Oral QHS   pantoprazole  40 mg Oral Daily   Continuous Infusions:  sodium chloride 1,000 mL (03/12/23 0226)   PRN Meds:.acetaminophen **OR** acetaminophen, ALPRAZolam, ondansetron **OR** ondansetron (ZOFRAN) IV, sorbitol   Anti-infectives (From admission, onward)    Start     Dose/Rate Route Frequency Ordered Stop   03/11/23 2200  hydroxychloroquine (PLAQUENIL) tablet 200 mg  Status:  Discontinued        200 mg Oral 2 times daily 03/11/23 2044 03/11/23 2057       Subjective: Kathy [name] today has no fevers, no emesis,  No chest pain,   -Complains of fatigue and weakness   Objective: Vitals:   03/11/23 2049 03/11/23 2126 03/12/23 1008 03/12/23 1506  BP:  127/65 (!) 113/55 (!) 103/52  Pulse:  99 83 82  Resp:  20 17 18   Temp: 98.5 F (36.9 C) 98.2 F (36.8 C) 98.4 F (36.9 C) 99.1 F (37.3 C)  TempSrc: Oral Oral Oral Oral  SpO2:  97% 99% 99%  Weight:  66 kg    Height:  5\' 1"  (1.549 m)      Intake/Output Summary (Last 24 hours) at 03/12/2023 1856 Last data filed at 03/12/2023 1805 Gross per 24 hour  Intake 840 ml  Output 700 ml  Net 140 ml   Filed Weights   03/11/23 1122 03/11/23 2126  Weight: 70.8 kg 66 kg    Physical Exam  Gen:- Awake Alert, no acute distress HEENT:- Catharine.AT, No sclera icterus Neck-Supple Neck,No JVD,.  Lungs-  CTAB , fair symmetrical air movement CV- S1, S2 normal, regular   Abd-  +ve B.Sounds, Abd Soft, No tenderness,    Extremity/Skin:- No  edema, pedal pulses present  Psych-affect is appropriate, oriented x3 Neuro-no new focal deficits, no tremors MSK-rheumatoid arthritis related deformities of with ulnar deviation  Data Reviewed: I have personally reviewed following labs and imaging studies  CBC: Recent Labs  Lab 03/11/23 1333 03/12/23 0528  WBC 8.3 6.8  NEUTROABS 5.7  --   HGB 8.4* 7.2*  HCT 25.5* 22.1*  MCV 101.2* 102.3*  PLT 191 184   Basic Metabolic Panel: Recent Labs  Lab 03/11/23 1156 03/12/23 0528  NA 135 138  K 5.0 3.9  CL 100 105  CO2 23 24  GLUCOSE 89 74  BUN 33* 26*  CREATININE 1.88* 1.61*  CALCIUM 14.2* 11.4*  MG  --  1.8   GFR: Estimated Creatinine Clearance: 24.2 mL/min (A) (by C-G formula based on SCr of 1.61 mg/dL (H)). Liver Function Tests: Recent Labs  Lab 03/11/23 1156 03/12/23 0528  AST 42* 29  ALT 20 19  ALKPHOS 66 52  BILITOT 0.6 0.4  PROT 11.7* 8.7*  ALBUMIN 2.0* <1.5*   Radiology Studies: CT CHEST ABDOMEN PELVIS W CONTRAST  Addendum Date: 03/11/2023   ADDENDUM REPORT: 03/11/2023 16:34 ADDENDUM: The original report was by Dr. Gaylyn Rong. The following addendum is by Dr. Gaylyn Rong: Please note that while series 2 raises the question of heterogeneous density in the pulmonary arteries which could be an indicator of pulmonary embolus, the IMAR images in series 3 clear up some of the streak artifact and this heterogeneity in the pulmonary arteries appears to essentially resolve. However, please note that today's exam was not protocol to assess for pulmonary embolus, and has reduced accuracy for pulmonary embolus especially due to the degree of streak artifact. Moreover, one additional point, there is some asymmetric density along the left iliopsoas tendon anterior to the hip, probably from a periprosthetic fluid collection tracking along the iliopsoas bursa. Electronically Signed   By: Gaylyn Rong M.D.   On: 03/11/2023 16:34   Result Date: 03/11/2023 CLINICAL DATA:  Weight loss, hypercalcemia EXAM: CT CHEST, ABDOMEN, AND PELVIS WITH CONTRAST TECHNIQUE: Multidetector CT imaging of the chest, abdomen and pelvis was performed following the standard protocol during bolus administration of intravenous contrast. RADIATION DOSE REDUCTION: This exam was performed according to the departmental dose-optimization program which includes automated exposure control, adjustment of the mA and/or kV according to patient size and/or use of iterative reconstruction technique. CONTRAST:  75mL OMNIPAQUE IOHEXOL 300 MG/ML  SOLN COMPARISON:  Renal ultrasound 01/25/2023 FINDINGS: CT CHEST FINDINGS Cardiovascular: Atherosclerotic vascular calcification of the thoracic aorta. Mild cardiomegaly. Mediastinum/Nodes: Unremarkable Lungs/Pleura: Volume loss with  bandlike density in the left lower lobe favoring atelectasis. There is also milder dependent subsegmental atelectasis in the right lower lobe. No lung mass identified. Musculoskeletal: Bilateral reverse shoulder arthroplasties. Bony demineralization. Lower cervical and thoracic spondylosis. Irregular right eighth and tenth ribs, some of this may be from motion, cannot exclude age indeterminate rib fractures. CT ABDOMEN PELVIS FINDINGS Hepatobiliary: No significant parenchymal lesion identified. There is likely some vascular shunting posteriorly in the inferior margin of the right hepatic lobe. Substantial extrahepatic biliary dilatation, common hepatic duct up to 1.7 cm. Cholecystectomy. Distal CBD up to 1.0 cm. Pancreas: Abnormal stranding and edema particularly around the pancreatic head and pancreatic body, with some relative hypodensity in the pancreatic head and body compared to the pancreatic tail. The appearance may be due to pancreatitis and edema, strictly speaking a small infiltrative pancreatic malignancy is not completely excluded. Spleen: Small size,  otherwise unremarkable. Adrenals/Urinary Tract: No hydronephrosis. Nonobstructive 5 mm left mid kidney calculus. Mild cortical thinning compatible with mild atrophy. Stomach/Bowel: Prior gastric bypass. Sigmoid colon diverticulosis with scattered diverticula of the descending colon. Proximal wall thickening in the descending colon likely ascribe a bowl to nondistention although segmental colitis is not excluded. There is some edema in this vicinity which could be from the pancreas, less likely to be related to the colon. Normal appendix. Vascular/Lymphatic: Atherosclerosis is present, including aortoiliac atherosclerotic disease. No substantial stenosis in the celiac trunk or SMA. Reproductive: Uterus absent, adnexa unremarkable. Other: Trace ascites along the left paracolic gutter. Musculoskeletal: Bilateral hip prostheses with substantial lucency around the left acetabular plate component compatible with osteolysis. There is some irregularity of the quadrilateral plate and potentially some stress reaction involving the medial aspect of the left superior pubic ramus. Some of this osteolysis has been present back through 08/22/2013. There is also some demineralization of the right superior pubic ramus laterally especially anteriorly. Interbody fusion at L2-3. Lumbar spondylosis and degenerative disc disease causing mild multilevel impingement. IMPRESSION: 1. Abnormal stranding and edema around the pancreatic head and pancreatic body, with some relative hypodensity in the pancreatic head and body compared to the pancreatic tail. The appearance may be due to pancreatitis and edema, strictly speaking a small infiltrative pancreatic malignancy is not completely excluded. Correlate with lipase levels and consider follow up pancreatic protocol MRI with and without contrast when feasible. 2. Substantial extrahepatic biliary dilatation, common hepatic duct up to 1.7 cm. This could be related to the pancreatic process. 3.  Mild cardiomegaly. 4. Irregular right eighth and tenth ribs, some of this may be from motion, cannot exclude age indeterminate rib fractures. 5. Sigmoid colon diverticulosis. 6. Proximal wall thickening in the descending colon, probably from nondistention although segmental colitis is not excluded. 7. Nonobstructive 5 mm left mid kidney calculus. 8. Bony demineralization. 9. 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 There is some irregularity of the left quadrilateral plate and potentially some stress reaction involving the medial aspect of the left superior pubic ramus. Some of this osteolysis has been present back through 08/22/2013. 10. Lumbar spondylosis and degenerative disc disease causing mild multilevel impingement. 11. Prior gastric bypass. 12. Aortic atherosclerosis. Aortic Atherosclerosis (ICD10-I70.0). Electronically Signed: By: Gaylyn Rong M.D. On: 03/11/2023 16:21   MR Brain W and Wo Contrast  Result Date: 03/11/2023 CLINICAL DATA:  Metastatic disease evaluation. EXAM: MRI HEAD WITHOUT AND WITH CONTRAST TECHNIQUE: Multiplanar, multiecho pulse sequences of the brain and surrounding structures were obtained without and with intravenous contrast. CONTRAST:  7mL GADAVIST  GADOBUTROL 1 MMOL/ML IV SOLN COMPARISON:  Same-day CT head FINDINGS: Brain: There is no acute intracranial hemorrhage, extra-axial fluid collection, or acute infarct. There is mild for age parenchymal volume loss with prominence of the ventricular system and extra-axial CSF spaces. There is minimal background chronic small-vessel ischemic change The pituitary and suprasellar region are normal. There is no mass lesion or abnormal enhancement. There is no mass effect or midline shift. Vascular: Normal flow voids. Skull and upper cervical spine: There are numerous calvarial lesions which demonstrate elevated DWI signal. Some of the lesions are intrinsically T1 hyperintense (for example  18-92, 18-84). Sinuses/Orbits: The paranasal sinuses are clear. Bilateral lens implants are in place. The globes and orbits are otherwise unremarkable. Other: The mastoid air cells and middle ear cavities are clear. IMPRESSION: 1. No evidence of intracranial metastatic disease or other acute intracranial pathology. 2. Numerous calvarial lesions as seen on CT are primarily T1 hyperintense which is more typical of benign lesions, with metastatic disease considered less likely. Electronically Signed   By: Lesia Hausen M.D.   On: 03/11/2023 16:23   CT Head Wo Contrast  Result Date: 03/11/2023 CLINICAL DATA:  Head trauma, abnormal mental status (Age 68-64y) EXAM: CT HEAD WITHOUT CONTRAST TECHNIQUE: Contiguous axial images were obtained from the base of the skull through the vertex without intravenous contrast. RADIATION DOSE REDUCTION: This exam was performed according to the departmental dose-optimization program which includes automated exposure control, adjustment of the mA and/or kV according to patient size and/or use of iterative reconstruction technique. COMPARISON:  CT Head 06/09/17, 01/24/23 FINDINGS: Brain: No evidence of acute infarction, hemorrhage, hydrocephalus, extra-axial collection or mass lesion/mass effect. Sequela of mild chronic microvascular ischemic change Vascular: No hyperdense vessel or unexpected calcification. Skull: There are multiple scattered lucent calvarial lesions, some of which are new compared to 2018, for example the lesion in the right parietal bone (series 3, image 40 Sinuses/Orbits: No middle ear or mastoid effusion. Paranasal sinuses are clear. Bilateral lens replacement. Orbits are otherwise unremarkable. Other: None. IMPRESSION: 1. No CT evidence of intracranial injury 2. Multiple scattered lucent calvarial lesions, some of which are new compared to 2018. While nonspecific, differential considerations include metastatic disease or multiple myeloma. Recommend correlation with  clinical history and consider further evaluation with a contrast-enhanced MRI of the brain. Electronically Signed   By: Lorenza Cambridge M.D.   On: 03/11/2023 13:47   DG Chest Portable 1 View  Result Date: 03/11/2023 CLINICAL DATA:  Pain after trauma.  Weakness EXAM: PORTABLE CHEST 1 VIEW COMPARISON:  X-ray 01/23/2023 FINDINGS: Enlarged cardiopericardial silhouette with a tortuous and ectatic aorta. No consolidation, pneumothorax or effusion. No edema. Degenerative changes seen along the spine. 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 IMPRESSION: Enlarged cardiopericardial silhouette.  No edema or consolidation. Electronically Signed   By: Karen Kays M.D.   On: 03/11/2023 13:42    Scheduled Meds:  aspirin  325 mg Oral Once   [START ON 03/14/2023] ferrous sulfate  325 mg Oral Daily   oxybutynin  10 mg Oral QHS   pantoprazole  40 mg Oral Daily   Continuous Infusions:  sodium chloride 1,000 mL (03/12/23 0226)    LOS: 1 day   Shon Hale M.D on 03/12/2023 at 6:56 PM  Go to [email or web address] - for contact info  [facility] - Office  [phone]  If 7PM-7AM, please contact night-coverage [email or web address] 03/12/2023, 6:56 PM

## 2023-03-13 LAB — CBC
HCT: 24.4 % — ABNORMAL LOW (ref 36.0–46.0)
Hemoglobin: 7.8 g/dL — ABNORMAL LOW (ref 12.0–15.0)
MCH: 33.2 pg (ref 26.0–34.0)
MCHC: 32 g/dL (ref 30.0–36.0)
MCV: 103.8 fL — ABNORMAL HIGH (ref 80.0–100.0)
Platelets: 160 10*3/uL (ref 150–400)
RBC: 2.35 MIL/uL — ABNORMAL LOW (ref 3.87–5.11)
RDW: 16.3 % — ABNORMAL HIGH (ref 11.5–15.5)
WBC: 7.2 10*3/uL (ref 4.0–10.5)
nRBC: 0.3 % — ABNORMAL HIGH (ref 0.0–0.2)

## 2023-03-13 LAB — RENAL FUNCTION PANEL
Albumin: <1.5 g/dL — ABNORMAL LOW (ref 3.5–5.0)
Anion gap: 7 (ref 5–15)
BUN: 23 mg/dL (ref 8–23)
CO2: 23 mmol/L (ref 22–32)
Calcium: 10.1 mg/dL (ref 8.9–10.3)
Chloride: 106 mmol/L (ref 98–111)
Creatinine, Ser: 1.63 mg/dL — ABNORMAL HIGH (ref 0.44–1.00)
GFR, Estimated: 32 mL/min — ABNORMAL LOW (ref >60)
Glucose, Bld: 74 mg/dL (ref 70–99)
Phosphorus: 1.6 mg/dL — ABNORMAL LOW (ref 2.5–4.6)
Potassium: 3.2 mmol/L — ABNORMAL LOW (ref 3.5–5.1)
Sodium: 136 mmol/L (ref 135–145)

## 2023-03-13 MED ORDER — POTASSIUM CHLORIDE CRYS ER 20 MEQ PO TBCR
40.0000 meq | EXTENDED_RELEASE_TABLET | ORAL | Status: AC
  Administered 2023-03-13 (×2): 40 meq via ORAL
  Filled 2023-03-13 (×2): qty 2

## 2023-03-13 MED ORDER — BOOST / RESOURCE BREEZE PO LIQD CUSTOM
1.0000 | Freq: Three times a day (TID) | ORAL | Status: DC
  Administered 2023-03-14 – 2023-03-16 (×6): 1 via ORAL

## 2023-03-13 MED ORDER — K PHOS MONO-SOD PHOS DI & MONO 155-852-130 MG PO TABS
250.0000 mg | ORAL_TABLET | Freq: Three times a day (TID) | ORAL | Status: DC
  Administered 2023-03-13: 250 mg via ORAL
  Filled 2023-03-13 (×2): qty 1

## 2023-03-13 NOTE — Plan of Care (Signed)
  Problem: Acute Rehab PT Goals(only PT should resolve) Goal: Pt will Roll Supine to Side Flowsheets (Taken 03/13/2023 0946) Pt will Roll Supine to Side: Independently Goal: Patient Will Perform Sitting Balance Flowsheets (Taken 03/13/2023 0946) Patient will perform sitting balance: Independently Goal: Patient Will Transfer Sit To/From Stand Flowsheets (Taken 03/13/2023 0946) Patient will transfer sit to/from stand: with modified independence Goal: Pt Will Transfer Bed To Chair/Chair To Bed Flowsheets (Taken 03/13/2023 0946) Pt will Transfer Bed to Chair/Chair to Bed: with modified independence Goal: Pt Will Ambulate Flowsheets (Taken 03/13/2023 0946) Pt will Ambulate:  with modified independence  50 feet  Nelida Meuse PT, DPT Physical Therapist with [facility] Out[facility] [phone] office

## 2023-03-13 NOTE — Evaluation (Signed)
Physical Therapy Evaluation Patient Details Name: Kathy [name] MRN: [id number] DOB: 1940/12/06 Today's Date: 03/13/2023  History of Present Illness  82 y.o. female with medical history significant for hypercalcemia and AKI, rheumatoid arthritis, GI bleed secondary to gastrojejunal ulcer in 2023, new onset atrial fibrillation in July 2024 now on Eliquis and recurrent/persistent hypercalcemia readmitted on 03/11/2023 with elevated calcium levels in the setting of exogenous calcium intake the patient with history of parathyroid malignancy in 2006  Clinical Impression   Pt is a 82yo female presenting to [facility] due to reason stated in the HPI.  Pt was limited today's Physical Therapy Evaluation, pt demonstrating slow, labored movement throughout evaluation, given mod to max assist for bed mobility and attempted transfers. Prior to admission, pt living independently with house and community ambulation and ADLs. Currently pt is experiencing functional mobility deficits, reduced balance and safety, reduced transfer and ambulation capacity. Based upon these deficits/impairments, pt would benefit from skilled acute physical therapy services to address the above deficits and improve their functional status. PT recommends pt discharge to short term skilled rehab facility in order to improve pt's functional status, safety and independence with functional mobility and overall QOL.             If plan is discharge home, recommend the following: A lot of help with walking and/or transfers;A lot of help with bathing/dressing/bathroom   Can travel by private vehicle   No    Equipment Recommendations None recommended by PT  Recommendations for Other Services  OT consult    Functional Status Assessment Patient has had a recent decline in their functional status and demonstrates the ability to make significant improvements in function in a reasonable and predictable amount of  time.     Precautions / Restrictions Precautions Precautions: None Restrictions Weight Bearing Restrictions: No      Mobility  Bed Mobility Overal bed mobility: Needs Assistance Bed Mobility: Rolling, Supine to Sit, Sit to Supine Rolling: Min assist   Supine to sit: Max assist Sit to supine: Max assist   General bed mobility comments: Slow and labored movement for all rolling and bed mobility. Pt given heavy verbal and tactile cues with time to respond. Rolling bilaterally with directional cues for hand placement on bed rail. Pt with heavy dependence on therapist and bed rails when performing supine to sit. Roling bilaterally x2. Supine to sit x 1 with max assist. Sit to supine x 1 with max assist. superior sliding for bed positioning max assist x 1 with trendelenberg positioning and BUE pulling on rail. Patient Response: Cooperative, Flat affect  Transfers Overall transfer level: Needs assistance Equipment used: Rolling walker (2 wheels), None Transfers: Sit to/from Stand, Bed to chair/wheelchair/BSC Sit to Stand: Total assist           General transfer comment: 3x sit/stand attempts from EOB. 1st attempt with RW, 2nd and 3rd attempt without RW; total assist x 1 with poor hip elevation from EOB. REduced anterior weight shift. Attempted standpivot transfer in recliner, limited pelvic lift from bed, placed back on EOB. Posteriorly slidng pelvis started occuring at EOB. Therapist blocking knees to prevent slide. Pt laid trunk back on bed and therapist provided BLE assist back on bed to prevent fall.    Ambulation/Gait                  Acupuncturist  Bed Tilt Bed Patient Response: Cooperative, Flat affect  Modified Rankin (Stroke Patients Only)       Balance Overall balance assessment: Needs assistance Sitting-balance support: Bilateral upper extremity supported Sitting balance-Leahy Scale: Poor Sitting balance -  Comments: moderate assistance in sitting with heavy BUE support on bed. Postural control: Posterior lean   Standing balance-Leahy Scale: Zero Standing balance comment: Reliant on therapist.                             Pertinent Vitals/Pain Pain Assessment Pain Assessment: No/denies pain    Home Living Family/patient expects to be discharged to:: Private residence Living Arrangements: Alone Available Help at Discharge: Family;Available PRN/intermittently Type of Home: House Home Access: Stairs to enter Entrance Stairs-Rails: Right;Can reach both Entrance Stairs-Number of Steps: 4   Home Layout: One level Home Equipment: Agricultural consultant (2 wheels);Cane - single point;BSC/3in1;Tub bench;Grab bars - tub/shower Additional Comments: Pt reports wanting to go to [employer]    Prior Function Prior Level of Function : Independent/Modified Independent;Driving             Mobility Comments: Pt reports SPC for household ambulation. RW for community ambulation ADLs Comments: Independent with all ADLs.     Extremity/Trunk Assessment   Upper Extremity Assessment Upper Extremity Assessment: RUE deficits/detail;LUE deficits/detail RUE Deficits / Details: 2+/5 Shoulder flexion RUE Sensation: WNL LUE Deficits / Details: 2+/5 Shoulder flexion LUE Sensation: WNL    Lower Extremity Assessment Lower Extremity Assessment: RLE deficits/detail;LLE deficits/detail RLE Deficits / Details: 3-/5 for antigravity movement of BLE: hip abduction/adduction, hip flexion, knee extension. RLE Sensation: WNL LLE Deficits / Details: 2+/5 fo movement of BLE: knee extension. 3-/5: hip abduction/adduction, hip flexion. LLE Sensation: WNL    Cervical / Trunk Assessment Cervical / Trunk Assessment: Kyphotic  Communication   Communication Communication: No apparent difficulties Cueing Techniques: Verbal cues;Tactile cues  Cognition Arousal: Alert Behavior During Therapy: WFL for tasks  assessed/performed Overall Cognitive Status: Within Functional Limits for tasks assessed                                          General Comments      Exercises     Assessment/Plan    PT Assessment Patient needs continued PT services  PT Problem List Decreased strength;Decreased range of motion;Decreased activity tolerance;Decreased balance;Decreased mobility       PT Treatment Interventions DME instruction;Gait training;Functional mobility training;Therapeutic activities;Therapeutic exercise;Balance training;Neuromuscular re-education    PT Goals (Current goals can be found in the Care Plan section)  Acute Rehab PT Goals Patient Stated Goal: Go to [facility] PT Goal Formulation: With patient Time For Goal Achievement: 03/27/23 Potential to Achieve Goals: Good    Frequency Min 3X/week     Co-evaluation               AM-PAC PT "6 Clicks" Mobility  Outcome Measure Help needed turning from your back to your side while in a flat bed without using bedrails?: A Little Help needed moving from lying on your back to sitting on the side of a flat bed without using bedrails?: A Lot Help needed moving to and from a bed to a chair (including a wheelchair)?: A Lot Help needed standing up from a chair using your arms (e.g., wheelchair or bedside chair)?: A Lot Help needed to walk in  hospital room?: Total Help needed climbing 3-5 steps with a railing? : Total 6 Click Score: 11    End of Session Equipment Utilized During Treatment: Gait belt Activity Tolerance: Patient tolerated treatment well;Patient limited by lethargy Patient left: in bed;with call bell/phone within reach;with bed alarm set Nurse Communication: Need for lift equipment;Mobility status PT Visit Diagnosis: Other abnormalities of gait and mobility (R26.89);Muscle weakness (generalized) (M62.81)    Time: 4098-1191 PT Time Calculation (min) (ACUTE ONLY): 27 min   Charges:   PT  Evaluation $PT Eval Low Complexity: 1 Low   PT General Charges $$ ACUTE PT VISIT: 1 Visit         Nelida Meuse PT, DPT Physical Therapist with [facility] Outpatient Rehabilitation 336 [phone] office  Nelida Meuse 03/13/2023, 9:42 AM

## 2023-03-13 NOTE — Progress Notes (Signed)
Initial Nutrition Assessment  DOCUMENTATION CODES:   Not applicable  INTERVENTION:   -Boost Breeze po TID, each supplement provides 250 kcal and 9 grams of protein  -Continue regular diet   NUTRITION DIAGNOSIS:   Increased nutrient needs related to chronic illness as evidenced by estimated needs.  GOAL:   Patient will meet greater than or equal to 90% of their needs  MONITOR:   PO intake, Supplement acceptance  REASON FOR ASSESSMENT:   Malnutrition Screening Tool    ASSESSMENT:   Pt with medical history significant for hypercalcemia and AKI, rheumatoid arthritis, GI bleed secondary to gastrojejunal ulcer, new onset atrial fibrillation, and recurrent/persistent hypercalcemia readmitted with elevated calcium levels in the setting of exogenous calcium intake. Pt with history of parathyroid malignancy.  Pt admitted with persistent hypercalcemia.   Reviewed I/O's: +220 ml x 24 hours and -360 ml since admission  UOP: 300 ml x 24 hours  Pt unavailable at time of visit. Attempted to speak with pt via call to hospital room phone, however, unable to reach. RD unable to obtain further nutrition-related history or complete nutrition-focused physical exam at this time.    Per MD notes, pt's endocrinologist manages her calcium and recently discontinued calcium supplementation. She has had frequent falls PTA.  Pt is currently on a regular diet. Noted meal completions 50-75%.   Reviewed wt hx; pt has experienced a 6.8% wt loss over the past 6 months, which is not significant for time frame.   Medications reviewed and include ferrous sulfate, protonoix, phosphorus, and 0.9% sodium chloride infusion @ 75 ml/hr.   Per MD notes, pt is medically stable for discharge once SNF bed is available.   Lab Results  Component Value Date   HGBA1C 5.1 05/23/2017   PTA DM medications are none.   Labs reviewed: K: 3.2 (inpatient orders for glycemic control are none).    Diet Order:   Diet  Order             Diet regular Room service appropriate? Yes; Fluid consistency: Thin  Diet effective now                   EDUCATION NEEDS:   No education needs have been identified at this time  Skin:  Skin Assessment: Reviewed RN Assessment  Last BM:  03/12/23  Height:   Ht Readings from Last 1 Encounters:  03/11/23 5\' 1"  (1.549 m)    Weight:   Wt Readings from Last 1 Encounters:  03/11/23 66 kg    Ideal Body Weight:  47.7 kg  BMI:  Body mass index is 27.49 kg/m.  Estimated Nutritional Needs:   Kcal:  1650-1850  Protein:  85-100 grams  Fluid:  > 1.6 L    Levada Schilling, RD, LDN, CDCES Registered Dietitian II Certified Diabetes Care and Education Specialist Please refer to [facility] for RD and/or RD on-call/weekend/after hours pager

## 2023-03-13 NOTE — Plan of Care (Signed)

## 2023-03-13 NOTE — [place] FL2 (Signed)
[place] MEDICAID FL2 LEVEL OF CARE FORM     IDENTIFICATION  Patient Name: Roselle Cucinella Masri Birthdate: [birth year] Sex: female Admission Date (Current Location): 03/11/2023  [facility] and [place] Number:  [employer] and Address:  [facility],  [address] [place] [address]      Provider Number: [id number]  Attending Physician Name and Address:  Shon Hale, MD  Relative Name and Phone Number:  Verlon Setting [facility])  [phone] [facility])    Current Level of Care: Hospital Recommended Level of Care: Skilled Nursing Facility Prior Approval Number:    Date Approved/Denied:   PASRR Number: [id number]  Discharge Plan: SNF    Current Diagnoses: Patient Active Problem List   Diagnosis Date Noted   Leg swelling 02/21/2023   Hospital discharge follow-up 02/21/2023   Hypomagnesemia 02/21/2023   Urge incontinence of urine 02/21/2023   Hypercalcemia 01/23/2023   AKI (acute kidney injury) (HCC) 01/23/2023   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   Hammer toe of left foot 09/15/2022   Left foot pain 09/15/2022   Iron deficiency anemia due to chronic blood loss 07/19/2022   Rectal bleeding 01/21/2022   Gastrojejunal ulcer with hemorrhage    Encounter for general adult medical examination with abnormal findings 09/16/2021   Hypokalemia 09/16/2021   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   Leg edema 03/16/2021   Bilateral impacted cerumen 11/19/2020   Hearing loss 11/19/2020   Malignant tumor of thyroid gland (HCC) 10/08/2020   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   Polyarthritis    Bariatric surgery status    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/2017   Rheumatoid arthritis (HCC) 11/16/2016   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   Rotator cuff arthropathy, 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   S/P shoulder replacement 05/11/2016   Allergic rhinitis  10/19/2013   GERD (gastroesophageal reflux disease) 10/19/2013   Osteoporosis    Hypertension    Hypothyroid    Hyperlipidemia    Anxiety    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   Unspecified protein-calorie malnutrition (HCC) 10/14/2011   Vitamin deficiency 10/14/2011   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    Orientation RESPIRATION BLADDER Height & Weight     Self, Time, Situation, Place  Normal External catheter Weight: 145 lb 8.1 oz (66 kg) Height:  5\' 1"  (154.9 cm)  BEHAVIORAL SYMPTOMS/MOOD NEUROLOGICAL BOWEL NUTRITION STATUS      Continent Diet  AMBULATORY STATUS COMMUNICATION OF NEEDS Skin   Limited Assist Verbally Other (Comment) (DRY)                       Personal Care Assistance Level of Assistance  Bathing, Feeding, Dressing Bathing Assistance: Limited assistance Feeding assistance: Limited assistance Dressing Assistance: Limited assistance     Functional Limitations Info  Hearing   Hearing Info: Impaired      SPECIAL CARE FACTORS FREQUENCY  PT (By licensed PT), OT (By licensed OT)     PT Frequency: 5X a week OT Frequency: 5X a week            Contractures      Additional Factors Info  Code Status, Allergies Code Status Info: FULL Allergies Info: Asa (Aspirin), Macrobid (Nitrofurantoin)           Current Medications (03/13/2023):  This is the current hospital active medication list Current Facility-Administered Medications  Medication Dose Route Frequency Provider Last Rate Last Admin   0.9 %  sodium chloride infusion   Intravenous Continuous Shon Hale, MD 75 mL/hr at 03/13/23 0818 Rate Change at 03/13/23 0818   acetaminophen (TYLENOL) tablet 500 mg  500 mg Oral Q6H PRN Buena Irish, MD       Or   acetaminophen (TYLENOL) suppository 650 mg  650 mg Rectal Q6H PRN Buena Irish, MD       ALPRAZolam Prudy Feeler) tablet 0.25 mg  0.25 mg Oral QHS PRN Buena Irish, MD   0.25 mg at 03/13/23 0343   aspirin tablet  325 mg  325 mg Oral Once Kommor, Wyn Forster, MD       Melene Muller ON 03/14/2023] ferrous sulfate tablet 325 mg  325 mg Oral Daily Buena Irish, MD       ondansetron [facility]) tablet 4 mg  4 mg Oral Q6H PRN Buena Irish, MD       Or   ondansetron [facility]) injection 4 mg  4 mg Intravenous Q6H PRN Buena Irish, MD       oxybutynin (DITROPAN-XL) 24 hr tablet 10 mg  10 mg Oral QHS Buena Irish, MD   10 mg at 03/12/23 2108   pantoprazole (PROTONIX) EC tablet 40 mg  40 mg Oral Daily Buena Irish, MD   40 mg at 03/13/23 0817   sorbitol 70 % solution 30 mL  30 mL Oral Daily PRN Buena Irish, MD         Discharge Medications: Please see discharge summary for a list of discharge medications.  Relevant Imaging Results:  Relevant Lab Results:   Additional Information [id number]  Catalina Gravel, LCSW

## 2023-03-13 NOTE — Progress Notes (Addendum)
PROGRESS NOTE   Kathy [name], is a 82 y.o. female, DOB - 1941-03-12, NWG:[id number]  Admit date - 03/11/2023   Admitting Physician Buena Irish, MD  Outpatient Primary MD for the patient is [name], MD  LOS - 2  Chief Complaint  Patient presents with   Fall    Had fall this morning around 0500        Brief Narrative:   82 y.o. female with medical history significant for hypercalcemia and AKI, rheumatoid arthritis, GI bleed secondary to gastrojejunal ulcer in 2023, new onset atrial fibrillation in July 2024 now on Eliquis and recurrent/persistent hypercalcemia readmitted on 03/11/2023 with elevated calcium levels in the setting of exogenous calcium intake the patient with history of parathyroid malignancy in 2006 -- Patient is medically ready for discharge to SNF rehab when insurance authorization can be obtained and SNF bed is available   -Assessment and Plan: 1) persistent hypocalcemia--review of records reviewed WAS NORMAL BACK IN DECEMBER 2023 -Patient was started on calcium supplements earlier this year, initially she took 1500 mg of calcium daily... Recently reduced to 500 mg daily -Her endocrinologist usually manages her calcium -She was also on Prolia until recently -Stopped calcium supplementation -Patient received Zometa and calcitonin Ca2++----14.2>>>11.4>>10.1 (when adjusted for her low albumin calcium still high but trending down)  2) generalized weakness and deconditioning/history of falls--in the setting of advanced rheumatoid arthritis and chronic pain syndrome  -prior to admission patient was getting home health physical therapy -PT eval appreciated recommends SNF rehab  3)AKI----acute kidney injury on CKD stage - 2 Creatinine trended down with hydration -renally adjust medications, avoid nephrotoxic agents / dehydration  / hypotension  4)History of thyroid cancer in 2006.  She follows with Dr. Horald Pollen of endocrinology  -TSH is low at 0.089 -Hold PTA  levothyroxine as patient appears to be over replaced at this time  5)HTN--- hold amlodipine due to soft BP  6) hypophosphatemia/hypokalemia--replace  7) chronic anemia--probably related to underlying CKD and anemia of chronic disease in the setting of rheumatoid arthritis -Hgb may have dropped due to hydration -Monitor closely and transfuse as indicated  Status is: Inpatient   Disposition: The patient is from: Home              Anticipated d/c is to: SNF               Anticipated d/c date is: 1 day              Patient currently is medically stable to d/c. Barriers: --- Patient is medically ready for discharge to SNF rehab when insurance authorization can be obtained and SNF bed is available  Code Status :  -  Code Status: Full Code   Family Communication:    NA (patient is alert, awake and coherent)   DVT Prophylaxis  :   - SCDs      Lab Results  Component Value Date   PLT 184 03/12/2023   Inpatient Medications  Scheduled Meds:  aspirin  325 mg Oral Once   [START ON 03/14/2023] ferrous sulfate  325 mg Oral Daily   oxybutynin  10 mg Oral QHS   pantoprazole  40 mg Oral Daily   Continuous Infusions:  sodium chloride 75 mL/hr at 03/13/23 0818   PRN Meds:.acetaminophen **OR** acetaminophen, ALPRAZolam, ondansetron **OR** ondansetron (ZOFRAN) IV, sorbitol   Anti-infectives (From admission, onward)    Start     Dose/Rate Route Frequency Ordered Stop   03/11/23 2200  hydroxychloroquine (PLAQUENIL) tablet  200 mg  Status:  Discontinued        200 mg Oral 2 times daily 03/11/23 2044 03/11/23 2057       Subjective: Diamon Remedios today has no fevers, no emesis,  No chest pain,   -Complains of fatigue and weakness -Appetite is not great  Objective: Vitals:   03/12/23 1506 03/12/23 2107 03/13/23 0400 03/13/23 1431  BP: (!) 103/52 (!) 110/49 (!) 110/58 115/63  Pulse: 82 83 84 83  Resp: 18 18 18 17   Temp: 99.1 F (37.3 C) 98.3 F (36.8 C) 99.1 F (37.3 C) 98.5 F (36.9  C)  TempSrc: Oral Oral Oral   SpO2: 99% 97% 95% 96%  Weight:      Height:        Intake/Output Summary (Last 24 hours) at 03/13/2023 1718 Last data filed at 03/13/2023 1425 Gross per 24 hour  Intake 700 ml  Output 500 ml  Net 200 ml   Filed Weights   03/11/23 1122 03/11/23 2126  Weight: 70.8 kg 66 kg    Physical Exam  Gen:- Awake Alert, no acute distress HEENT:- [place].AT, No sclera icterus Neck-Supple Neck,No JVD,.  Lungs-  CTAB , fair symmetrical air movement CV- S1, S2 normal, regular  Abd-  +ve B.Sounds, Abd Soft, No tenderness,    Extremity/Skin:- No  edema, pedal pulses present  Psych-affect is appropriate, oriented x3 Neuro-generalized weakness, no new focal deficits, no tremors MSK-rheumatoid arthritis related deformities of with ulnar deviation  Data Reviewed: I have personally reviewed following labs and imaging studies  CBC: Recent Labs  Lab 03/11/23 1333 03/12/23 0528  WBC 8.3 6.8  NEUTROABS 5.7  --   HGB 8.4* 7.2*  HCT 25.5* 22.1*  MCV 101.2* 102.3*  PLT 191 184   Basic Metabolic Panel: Recent Labs  Lab 03/11/23 1156 03/12/23 0528 03/13/23 0425  NA 135 138 136  K 5.0 3.9 3.2*  CL 100 105 106  CO2 23 24 23   GLUCOSE 89 74 74  BUN 33* 26* 23  CREATININE 1.88* 1.61* 1.63*  CALCIUM 14.2* 11.4* 10.1  MG  --  1.8  --   PHOS  --   --  1.6*   GFR: Estimated Creatinine Clearance: 23.9 mL/min (A) (by C-G formula based on SCr of 1.63 mg/dL (H)). Liver Function Tests: Recent Labs  Lab 03/11/23 1156 03/12/23 0528 03/13/23 0425  AST 42* 29  --   ALT 20 19  --   ALKPHOS 66 52  --   BILITOT 0.6 0.4  --   PROT 11.7* 8.7*  --   ALBUMIN 2.0* <1.5* <1.5*   Radiology Studies: No results found.  Scheduled Meds:  aspirin  325 mg Oral Once   [START ON 03/14/2023] ferrous sulfate  325 mg Oral Daily   oxybutynin  10 mg Oral QHS   pantoprazole  40 mg Oral Daily   Continuous Infusions:  sodium chloride 75 mL/hr at 03/13/23 0818    LOS: 2 days    Shon Hale M.D on 03/13/2023 at 5:18 PM  Go to [email or web address] - for contact info  [facility] - Office  [phone]  If 7PM-7AM, please contact night-coverage [email or web address] 03/13/2023, 5:18 PM

## 2023-03-13 NOTE — TOC Transition Note (Addendum)
Transition of Care [facility]) - CM/SW Discharge Note   Patient Details  Name: Kathy [name] MRN: [id number] Date of Birth: Oct 06, 1940  Transition of Care [facility]) CM/SW Contact:  Catalina Gravel, LCSW Phone Number: 03/13/2023, 4:35 PM   Clinical Narrative:    CSW met with pt at bedside, agreed to SNF, pt feels too week to return home without PT. Pt provided preferred settings, [facility], [facility] or [facility].  PASSR and FL2 done.  Will need auth once bed offers received and selected if Auth.  TOC to follow.   Addendum- Pt first choice is [facility].    Barriers to Discharge: Insurance Authorization   Patient Goals and CMS Choice      Discharge Placement                         Discharge Plan and Services Additional resources added to the After Visit Summary for                                       Social Determinants of Health (SDOH) Interventions SDOH Screenings   Food Insecurity: No Food Insecurity (03/11/2023)  Housing: Low Risk  (03/11/2023)  Transportation Needs: No Transportation Needs (03/11/2023)  Utilities: Not At Risk (03/11/2023)  Alcohol Screen: Low Risk  (09/14/2021)  Depression (PHQ2-9): Low Risk  (02/21/2023)  Financial Resource Strain: Low Risk  (09/14/2021)  Physical Activity: Sufficiently Active (09/14/2021)  Social Connections: Moderately Isolated (09/14/2021)  Stress: No Stress Concern Present (09/14/2021)  Tobacco Use: Low Risk  (03/11/2023)     Readmission Risk Interventions     No data to display

## 2023-03-14 MED ORDER — POTASSIUM PHOSPHATES 15 MMOLE/5ML IV SOLN
30.0000 mmol | Freq: Once | INTRAVENOUS | Status: AC
  Administered 2023-03-14: 30 mmol via INTRAVENOUS
  Filled 2023-03-14: qty 10

## 2023-03-14 MED ORDER — SODIUM CHLORIDE 0.9 % IV SOLN: INTRAVENOUS | Status: AC

## 2023-03-14 NOTE — Consult Note (Signed)
   [facility] Inpatient Consult   03/14/2023  Kathy [name] [birth year] [id number]  Primary Care Provider:  Dr. Trena Platt  Patient is currently active with Care Management for chronic disease management services.  Patient has been engaged by a Charity fundraiser.  Our community based plan of care has focused on disease management and community resource support.   Patient will receive a post hospital call and will be evaluated for assessments and disease process education.   Plan: Pending bed offer for SNF level of care.  Inpatient Transition Of Care [TOC] team member to make aware that Care Management following.  Of note, Care Management services does not replace or interfere with any services that are needed or arranged by inpatient [facility] care management team.   For additional questions or referrals please contact:  Elliot Cousin, RN, [facility] Liaison [place]   Population Health Office Hours MTWF  8:00 am-6:00 pm Off on Thursday [phone] mobile [phone] [Office toll free line] Office Hours are M-F 8:30 - 5 pm [email or web address]

## 2023-03-14 NOTE — Progress Notes (Signed)
Patient assisted to bathroom X3 today and sat up in chair for most of day. No events on this shift. Awaiting SNF placement.

## 2023-03-14 NOTE — TOC Progression Note (Signed)
Transition of Care [facility]) - Progression Note    Patient Details  Name: Kathy [name] MRN: [id number] Date of Birth: 1940/09/13  [facility]) CM/SW Contact  Leitha Bleak, RN Phone Number: 03/14/2023, 2:00 PM  Clinical Narrative:   TOC calling SNF's for bed offers. [facility] wants [facility]. Patient recently in SNF. Per patient she stayed 7 days. INS AUTH started, waiting to see if [facility] accepts. They are reviewing her days used.     Expected Discharge Plan: Skilled Nursing Facility Barriers to Discharge: Insurance Authorization  Expected Discharge Plan and Services         Expected Discharge Date: 03/14/23                                     Social Determinants of Health (SDOH) Interventions SDOH Screenings   Food Insecurity: No Food Insecurity (03/11/2023)  Housing: Low Risk  (03/11/2023)  Transportation Needs: No Transportation Needs (03/11/2023)  Utilities: Not At Risk (03/11/2023)  Alcohol Screen: Low Risk  (09/14/2021)  Depression (PHQ2-9): Low Risk  (02/21/2023)  Financial Resource Strain: Low Risk  (09/14/2021)  Physical Activity: Sufficiently Active (09/14/2021)  Social Connections: Moderately Isolated (09/14/2021)  Stress: No Stress Concern Present (09/14/2021)  Tobacco Use: Low Risk  (03/11/2023)    Readmission Risk Interventions     No data to display

## 2023-03-14 NOTE — Care Management Important Message (Signed)
Important Message  Patient Details  Name: Kathy [name] MRN: [id number] Date of Birth: June 25, 1941   Medicare Important Message Given:  Yes     Corey Harold 03/14/2023, 10:08 AM

## 2023-03-14 NOTE — Progress Notes (Signed)
PROGRESS NOTE   Kathy [name], is a 82 y.o. female, DOB - 1940/11/17, ZOX:[id number]  Admit date - 03/11/2023   Admitting Physician Buena Irish, MD  Outpatient Primary MD for the patient is [name], MD  LOS - 3  Chief Complaint  Patient presents with   Fall    Had fall this morning around 0500        Brief Narrative:   82 y.o. female with medical history significant for hypercalcemia and AKI, rheumatoid arthritis, GI bleed secondary to gastrojejunal ulcer in 2023, new onset atrial fibrillation in July 2024 now on Eliquis and recurrent/persistent hypercalcemia readmitted on 03/11/2023 with elevated calcium levels in the setting of exogenous calcium intake the patient with history of parathyroid malignancy in 2006 -- Patient is medically ready for discharge to SNF rehab when insurance authorization can be obtained and SNF bed is available   -Assessment and Plan: 1) persistent hypocalcemia--review of records reviewed WAS NORMAL BACK IN DECEMBER 2023 -Patient was started on calcium supplements earlier this year, initially she took 1500 mg of calcium daily... 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  She follows with Dr.  Horald Pollen of endocrinology  -TSH is low at 0.089 -Hold PTA levothyroxine as patient appears to be over replaced at this time  5)HTN--- hold amlodipine due to soft BP  6) hypophosphatemia/hypokalemia--replaced electrolytes  7) chronic anemia--probably related to underlying CKD and anemia of chronic disease in the setting of rheumatoid arthritis -Hgb may have dropped due to hydration -Monitor closely and transfuse as indicated  Status is: Inpatient   Disposition: The patient is from: Home              Anticipated d/c is to: SNF               Anticipated d/c date is: 1 day              Patient currently is medically stable to d/c. Barriers: --- Patient is medically ready for discharge to SNF rehab when insurance authorization can be obtained and SNF bed is available  Code Status :  -  Code Status: Full Code   Family Communication:    NA (patient is alert, awake and coherent)   DVT Prophylaxis  :   - SCDs      Lab Results  Component Value Date   PLT 160 03/13/2023   Inpatient Medications  Scheduled Meds:  aspirin  325 mg Oral Once   feeding supplement  1 Container Oral TID BM   ferrous sulfate  325 mg Oral Daily   oxybutynin  10 mg Oral QHS   pantoprazole  40 mg Oral Daily   Continuous Infusions:  sodium chloride 40 mL/hr at 03/14/23 1057   PRN Meds:.acetaminophen **OR** acetaminophen, ALPRAZolam, ondansetron **OR** ondansetron (ZOFRAN) IV, sorbitol   Anti-infectives (From admission, onward)    Start  Dose/Rate Route Frequency Ordered Stop   03/11/23 2200  hydroxychloroquine (PLAQUENIL) tablet 200 mg  Status:  Discontinued        200 mg Oral 2 times daily 03/11/23 2044 03/11/23 2057       Subjective: Kathy [name] today has no fevers, no emesis,  No chest pain,   -Complains of fatigue and weakness -No new Concerns,  had BM on 03/13/2023 - -- Patient is medically ready for discharge to SNF rehab when insurance authorization can be obtained and SNF bed is  available  Objective: Vitals:   03/14/23 0200 03/14/23 0400 03/14/23 0440 03/14/23 1230  BP:   (!) 95/57 (!) 123/48  Pulse:   77 82  Resp: 18 17 18 16   Temp:   98.7 F (37.1 C)   TempSrc:   Oral   SpO2:   97% 100%  Weight:      Height:        Intake/Output Summary (Last 24 hours) at 03/14/2023 1806 Last data filed at 03/14/2023 1245 Gross per 24 hour  Intake 1710 ml  Output 2350 ml  Net -640 ml   Filed Weights   03/11/23 1122 03/11/23 2126  Weight: 70.8 kg 66 kg    Physical Exam  Gen:- Awake Alert, no acute distress HEENT:- [place].AT, No sclera icterus Neck-Supple Neck,No JVD,.  Lungs-  CTAB , fair symmetrical air movement CV- S1, S2 normal, regular  Abd-  +ve B.Sounds, Abd Soft, No tenderness,    Extremity/Skin:- No  edema, pedal pulses present  Psych-affect is appropriate, oriented x3 Neuro-generalized weakness, no new focal deficits, no tremors MSK-rheumatoid arthritis related deformities of with ulnar deviation  Data Reviewed: I have personally reviewed following labs and imaging studies  CBC: Recent Labs  Lab 03/11/23 1333 03/12/23 0528 03/13/23 1745  WBC 8.3 6.8 7.2  NEUTROABS 5.7  --   --   HGB 8.4* 7.2* 7.8*  HCT 25.5* 22.1* 24.4*  MCV 101.2* 102.3* 103.8*  PLT 191 184 160   Basic Metabolic Panel: Recent Labs  Lab 03/11/23 1156 03/12/23 0528 03/13/23 0425 03/14/23 0535  NA 135 138 136 134*  K 5.0 3.9 3.2* 3.6  CL 100 105 106 107  CO2 23 24 23  19*  GLUCOSE 89 74 74 76  BUN 33* 26* 23 20  CREATININE 1.88* 1.61* 1.63* 1.33*  CALCIUM 14.2* 11.4* 10.1 9.3  MG  --  1.8  --   --   PHOS  --   --  1.6* 1.7*   GFR: Estimated Creatinine Clearance: 29.3 mL/min (A) (by C-G formula based on SCr of 1.33 mg/dL (H)). Liver Function Tests: Recent Labs  Lab 03/11/23 1156 03/12/23 0528 03/13/23 0425 03/14/23 0535  AST 42* 29  --   --   ALT 20 19  --   --   ALKPHOS 66 52  --   --   BILITOT 0.6 0.4  --   --   PROT 11.7* 8.7*  --   --   ALBUMIN 2.0*  <1.5* <1.5* <1.5*   Radiology Studies: No results found.  Scheduled Meds:  aspirin  325 mg Oral Once   feeding supplement  1 Container Oral TID BM   ferrous sulfate  325 mg Oral Daily   oxybutynin  10 mg Oral QHS   pantoprazole  40 mg Oral Daily   Continuous Infusions:  sodium chloride 40 mL/hr at 03/14/23 1057    LOS: 3 days   Shon Hale M.D on 03/14/2023 at 6:06 PM  Go to [email or web address] - for contact info  [facility] - Office  [phone]  If 7PM-7AM, please contact night-coverage [email or web address] 03/14/2023, 6:06 PM

## 2023-03-15 LAB — CBC
HCT: 20.5 % — ABNORMAL LOW (ref 36.0–46.0)
HCT: 26.9 % — ABNORMAL LOW (ref 36.0–46.0)
Hemoglobin: 6.6 g/dL — CL (ref 12.0–15.0)
Hemoglobin: 8.9 g/dL — ABNORMAL LOW (ref 12.0–15.0)
MCH: 33 pg (ref 26.0–34.0)
MCH: 32.8 pg (ref 26.0–34.0)
MCHC: 32.2 g/dL (ref 30.0–36.0)
MCHC: 33.1 g/dL (ref 30.0–36.0)
MCV: 102.5 fL — ABNORMAL HIGH (ref 80.0–100.0)
MCV: 99.3 fL (ref 80.0–100.0)
Platelets: 138 10*3/uL — ABNORMAL LOW (ref 150–400)
Platelets: 159 10*3/uL (ref 150–400)
RBC: 2 MIL/uL — ABNORMAL LOW (ref 3.87–5.11)
RBC: 2.71 MIL/uL — ABNORMAL LOW (ref 3.87–5.11)
RDW: 16.1 % — ABNORMAL HIGH (ref 11.5–15.5)
RDW: 18.2 % — ABNORMAL HIGH (ref 11.5–15.5)
WBC: 5.7 10*3/uL (ref 4.0–10.5)
WBC: 6.1 10*3/uL (ref 4.0–10.5)
nRBC: 0 % (ref 0.0–0.2)
nRBC: 0 % (ref 0.0–0.2)

## 2023-03-15 LAB — PREPARE RBC (CROSSMATCH)

## 2023-03-15 LAB — PROTEIN ELECTROPHORESIS, SERUM
A/G Ratio: 0.4 — ABNORMAL LOW (ref 0.7–1.7)
Albumin ELP: 2.5 g/dL — ABNORMAL LOW (ref 2.9–4.4)
Alpha-1-Globulin: 0.3 g/dL (ref 0.0–0.4)
Alpha-2-Globulin: 0.6 g/dL (ref 0.4–1.0)
Beta Globulin: 5.6 g/dL — ABNORMAL HIGH (ref 0.7–1.3)
Gamma Globulin: 0.1 g/dL — ABNORMAL LOW (ref 0.4–1.8)
Globulin, Total: 6.5 g/dL — ABNORMAL HIGH (ref 2.2–3.9)
M-Spike, %: 4.9 g/dL — ABNORMAL HIGH
Total Protein ELP: 9 g/dL — ABNORMAL HIGH (ref 6.0–8.5)

## 2023-03-15 MED ORDER — FUROSEMIDE 10 MG/ML IJ SOLN
40.0000 mg | Freq: Once | INTRAMUSCULAR | Status: AC
  Administered 2023-03-15: 40 mg via INTRAVENOUS
  Filled 2023-03-15: qty 4

## 2023-03-15 MED ORDER — DARBEPOETIN ALFA 200 MCG/0.4ML IJ SOSY
200.0000 ug | PREFILLED_SYRINGE | Freq: Once | INTRAMUSCULAR | Status: AC
  Administered 2023-03-15: 200 ug via SUBCUTANEOUS
  Filled 2023-03-15: qty 0.4

## 2023-03-15 MED ORDER — SODIUM CHLORIDE 0.9% IV SOLUTION: Freq: Once | INTRAVENOUS | Status: AC

## 2023-03-15 MED ORDER — POTASSIUM PHOSPHATES 15 MMOLE/5ML IV SOLN
15.0000 mmol | Freq: Once | INTRAVENOUS | Status: AC
  Administered 2023-03-15: 15 mmol via INTRAVENOUS
  Filled 2023-03-15: qty 5

## 2023-03-15 MED ORDER — POTASSIUM CHLORIDE CRYS ER 20 MEQ PO TBCR
40.0000 meq | EXTENDED_RELEASE_TABLET | ORAL | Status: AC
  Administered 2023-03-15 (×2): 40 meq via ORAL
  Filled 2023-03-15: qty 2

## 2023-03-15 NOTE — Consult Note (Signed)
   [facility] Inpatient Consult   03/15/2023  Macilynn Hussain Blakeman 1940-09-09 [id number]  Update: Pt will d/c to SNF level of care at [facility] with his ongoing care management needs managed by the facility. Liaison will collaborate with the community nurse care coordinator and [facility] RN of the disposition plan for pt's discharge.  Elliot Cousin, RN, [facility] Liaison [place]   Population Health Office Hours [email or web address]  8:00 am-6:00 pm Off on Thursday [phone] mobile [phone] [Office toll free line] Office Hours are M-F 8:30 - 5 pm [email or web address]

## 2023-03-15 NOTE — Progress Notes (Signed)
Hgb is down to 6.6 this am. No signs of bleeding noted by her RN. Plan to transfuse 1 unit RBCs.

## 2023-03-15 NOTE — TOC Progression Note (Signed)
Transition of Care [facility]) - Progression Note    Patient Details  Name: Kathy [name] MRN: [id number] Date of Birth: November 29, 1940  Transition of Care [facility]) CM/SW Contact  Leitha Bleak, RN Phone Number: 03/15/2023, 11:46 AM  Clinical Narrative:   [facility] made an offer, patient accepted, bed added to INS Auth.     Expected Discharge Plan: Skilled Nursing Facility Barriers to Discharge: Insurance Authorization  Expected Discharge Plan and Services         Expected Discharge Date: 03/14/23                  Social Determinants of Health (SDOH) Interventions SDOH Screenings   Food Insecurity: No Food Insecurity (03/11/2023)  Housing: Low Risk  (03/11/2023)  Transportation Needs: No Transportation Needs (03/11/2023)  Utilities: Not At Risk (03/11/2023)  Alcohol Screen: Low Risk  (09/14/2021)  Depression (PHQ2-9): Low Risk  (02/21/2023)  Financial Resource Strain: Low Risk  (09/14/2021)  Physical Activity: Sufficiently Active (09/14/2021)  Social Connections: Moderately Isolated (09/14/2021)  Stress: No Stress Concern Present (09/14/2021)  Tobacco Use: Low Risk  (03/11/2023)    Readmission Risk Interventions     No data to display

## 2023-03-15 NOTE — Plan of Care (Signed)

## 2023-03-15 NOTE — TOC Progression Note (Signed)
Transition of Care [facility]) - Progression Note    Patient Details  Name: Kathy [name] MRN: [id number] Date of Birth: 08/01/1941  Transition of Care [facility]) CM/SW Contact  Leitha Bleak, RN Phone Number: 03/15/2023, 2:13 PM  Clinical Narrative:    Insurance  approved 8/13 - 8/15, next review date 8/15, navi auth id [id number]. Auth given to [employer] at [facility]. They can admit tomorrow. Team updated.      Expected Discharge Plan: Skilled Nursing Facility Barriers to Discharge: Insurance Authorization  Expected Discharge Plan and Services         Expected Discharge Date: 03/14/23                                     Social Determinants of Health (SDOH) Interventions SDOH Screenings   Food Insecurity: No Food Insecurity (03/11/2023)  Housing: Low Risk  (03/11/2023)  Transportation Needs: No Transportation Needs (03/11/2023)  Utilities: Not At Risk (03/11/2023)  Alcohol Screen: Low Risk  (09/14/2021)  Depression (PHQ2-9): Low Risk  (02/21/2023)  Financial Resource Strain: Low Risk  (09/14/2021)  Physical Activity: Sufficiently Active (09/14/2021)  Social Connections: Moderately Isolated (09/14/2021)  Stress: No Stress Concern Present (09/14/2021)  Tobacco Use: Low Risk  (03/11/2023)    Readmission Risk Interventions     No data to display

## 2023-03-15 NOTE — Progress Notes (Addendum)
PROGRESS NOTE  Kathy [name], is a 82 y.o. female, DOB - 1941/07/23, WUJ:[id number]  Admit date - 03/11/2023   Admitting Physician Buena Irish, MD  Outpatient Primary MD for the patient is [name], MD  LOS - 4  Chief Complaint  Patient presents with   Fall    Had fall this morning around 0500      Brief Narrative:   82 y.o. female with medical history significant for hypercalcemia and AKI, rheumatoid arthritis, GI bleed secondary to gastrojejunal ulcer in 2023, new onset atrial fibrillation in July 2024 now on Eliquis and recurrent/persistent hypercalcemia readmitted on 03/11/2023 with elevated calcium levels in the setting of exogenous calcium intake the patient with history of parathyroid malignancy in 2006 -- Patient is medically ready for discharge to SNF rehab when insurance authorization can be obtained and SNF bed is available   -Assessment and Plan: 1)Persistent Hypercalcemia--review of records reviewed WAS NORMAL BACK IN DECEMBER 2023 -Patient was started on calcium supplements earlier this year, initially she took 1500 mg of calcium daily... Recently reduced to 500 mg daily -Her endocrinologist usually manages her calcium -She was also on Prolia until recently -Stopped calcium supplementation -Patient received Zometa and calcitonin Ca2++----14.2>>>11.4>>10.1>>8.4 (when adjusted for her low albumin calcium still high but trending down)  2)Generalized Weakness and deconditioning/history of falls--in the setting of advanced rheumatoid arthritis and chronic pain syndrome  -prior to admission patient was getting home health physical therapy -PT eval appreciated ,recommends SNF rehab  3)AKI----acute kidney injury on CKD stage - 2 -Creatinine on admission was 1.88 Creatinine trended down with hydration--currently down to 1.08 -renally adjust medications, avoid nephrotoxic agents / dehydration  / hypotension  4)History of thyroid cancer in 2006.  She follows with Dr.  Horald Pollen of endocrinology  -TSH is low at 0.089 -Hold PTA levothyroxine as patient appears to be over replaced at this time  5)HTN--- hold amlodipine due to soft BP  6) hypophosphatemia/hypokalemia--replaced electrolytes  7) acute on  chronic anemia--probably related to underlying CKD and anemia of chronic disease in the setting of rheumatoid arthritis --Hgb down to 6.6 overnight patient will be transfused 2 units of PRBC -No obvious bleeding concerns -Give Aranesp -Continue iron supplementation  Status is: Inpatient   Disposition: The patient is from: Home              Anticipated d/c is to: SNF               Anticipated d/c date is: 1 day              Patient currently is medically stable to d/c. Barriers: --- Patient is medically ready for discharge to SNF rehab when insurance authorization can be obtained and SNF bed is available  Code Status :  -  Code Status: Full Code   Family Communication:    NA (patient is alert, awake and coherent)  Patient request I called and updated her brother Donnie  DVT Prophylaxis  :   - SCDs      Lab Results  Component Value Date   PLT 159 03/15/2023   Inpatient Medications  Scheduled Meds:  feeding supplement  1 Container Oral TID BM   ferrous sulfate  325 mg Oral Daily   oxybutynin  10 mg Oral QHS   pantoprazole  40 mg Oral Daily   Continuous Infusions:  potassium PHOSPHATE IVPB (in mmol) 15 mmol (03/15/23 1130)   PRN Meds:.acetaminophen **OR** acetaminophen, ALPRAZolam, ondansetron **OR** ondansetron (ZOFRAN) IV, sorbitol  Anti-infectives (From admission, onward)    Start     Dose/Rate Route Frequency Ordered Stop   03/11/23 2200  hydroxychloroquine (PLAQUENIL) tablet 200 mg  Status:  Discontinued        200 mg Oral 2 times daily 03/11/23 2044 03/11/23 2057      Subjective: Kathy [name] today has no fevers, no emesis,  No chest pain,   -Complains of fatigue and weakness Hgb down to 6.6 overnight patient will be transfused  2 units of PRBC Patient request I called and updated her brother Donnie  Objective: Vitals:   03/15/23 0834 03/15/23 0835 03/15/23 1106 03/15/23 1412  BP: 128/68 128/68 115/60 114/62  Pulse: 76 76 67 69  Resp: 16 16 16  (!) 24  Temp: 98.6 F (37 C) 98.6 F (37 C) 98.7 F (37.1 C) 99.1 F (37.3 C)  TempSrc:  Oral Oral Oral  SpO2:  100% 100% 99%  Weight:      Height:        Intake/Output Summary (Last 24 hours) at 03/15/2023 1717 Last data filed at 03/15/2023 1630 Gross per 24 hour  Intake 2027.67 ml  Output 2200 ml  Net -172.33 ml   Filed Weights   03/11/23 1122 03/11/23 2126  Weight: 70.8 kg 66 kg   Physical Exam Gen:- Awake Alert, no acute distress HEENT:- [place].AT, No sclera icterus Neck-Supple Neck,No JVD,.  Lungs-  CTAB , fair symmetrical air movement CV- S1, S2 normal, regular  Abd-  +ve B.Sounds, Abd Soft, No tenderness,    Extremity/Skin:- No  edema, pedal pulses present  Psych-affect is appropriate, oriented x3 Neuro-generalized weakness, no new focal deficits, no tremors MSK-rheumatoid arthritis related deformities of with ulnar deviation  Data Reviewed: I have personally reviewed following labs and imaging studies  CBC: Recent Labs  Lab 03/11/23 1333 03/12/23 0528 03/13/23 1745 03/15/23 0438 03/15/23 1350  WBC 8.3 6.8 7.2 5.7 6.1  NEUTROABS 5.7  --   --   --   --   HGB 8.4* 7.2* 7.8* 6.6* 8.9*  HCT 25.5* 22.1* 24.4* 20.5* 26.9*  MCV 101.2* 102.3* 103.8* 102.5* 99.3  PLT 191 184 160 138* 159   Basic Metabolic Panel: Recent Labs  Lab 03/11/23 1156 03/12/23 0528 03/13/23 0425 03/14/23 0535 03/15/23 0438  NA 135 138 136 134* 133*  K 5.0 3.9 3.2* 3.6 3.2*  CL 100 105 106 107 108  CO2 23 24 23  19* 19*  GLUCOSE 89 74 74 76 86  BUN 33* 26* 23 20 16   CREATININE 1.88* 1.61* 1.63* 1.33* 1.08*  CALCIUM 14.2* 11.4* 10.1 9.3 8.4*  MG  --  1.8  --   --   --   PHOS  --   --  1.6* 1.7* 2.4*   GFR: Estimated Creatinine Clearance: 36.1 mL/min (A) (by  C-G formula based on SCr of 1.08 mg/dL (H)). Liver Function Tests: Recent Labs  Lab 03/11/23 1156 03/12/23 0528 03/13/23 0425 03/14/23 0535 03/15/23 0438  AST 42* 29  --   --   --   ALT 20 19  --   --   --   ALKPHOS 66 52  --   --   --   BILITOT 0.6 0.4  --   --   --   PROT 11.7* 8.7*  --   --   --   ALBUMIN 2.0* <1.5* <1.5* <1.5* <1.5*   Radiology Studies: No results found.  Scheduled Meds:  feeding supplement  1 Container Oral TID BM  ferrous sulfate  325 mg Oral Daily   oxybutynin  10 mg Oral QHS   pantoprazole  40 mg Oral Daily   Continuous Infusions:  potassium PHOSPHATE IVPB (in mmol) 15 mmol (03/15/23 1130)    LOS: 4 days   Shon Hale M.D on 03/15/2023 at 5:17 PM  Go to [email or web address] - for contact info  [facility] - Office  [phone]  If 7PM-7AM, please contact night-coverage [email or web address] 03/15/2023, 5:17 PM

## 2023-03-15 NOTE — Progress Notes (Signed)
Date and time results received: 03/15/23 9629  Reported by: Lamar Laundry, Lab Reported to: Pamelia Hoit, RN  Test: hemoglobin Critical Value: 6.6  Name of Provider Notified: Opyd  Orders Received? Or Actions Taken?: MD notified, awaiting orders

## 2023-03-16 DIAGNOSIS — K219 Gastro-esophageal reflux disease without esophagitis: Secondary | ICD-10-CM | POA: Diagnosis not present

## 2023-03-16 DIAGNOSIS — N3281 Overactive bladder: Secondary | ICD-10-CM | POA: Diagnosis not present

## 2023-03-16 DIAGNOSIS — N179 Acute kidney failure, unspecified: Secondary | ICD-10-CM | POA: Diagnosis not present

## 2023-03-16 DIAGNOSIS — M6281 Muscle weakness (generalized): Secondary | ICD-10-CM | POA: Diagnosis not present

## 2023-03-16 DIAGNOSIS — I480 Paroxysmal atrial fibrillation: Secondary | ICD-10-CM | POA: Diagnosis not present

## 2023-03-16 DIAGNOSIS — E039 Hypothyroidism, unspecified: Secondary | ICD-10-CM | POA: Diagnosis not present

## 2023-03-16 DIAGNOSIS — R1313 Dysphagia, pharyngeal phase: Secondary | ICD-10-CM | POA: Diagnosis not present

## 2023-03-16 DIAGNOSIS — Z7401 Bed confinement status: Secondary | ICD-10-CM | POA: Diagnosis not present

## 2023-03-16 DIAGNOSIS — E119 Type 2 diabetes mellitus without complications: Secondary | ICD-10-CM | POA: Diagnosis not present

## 2023-03-16 DIAGNOSIS — I959 Hypotension, unspecified: Secondary | ICD-10-CM | POA: Diagnosis not present

## 2023-03-16 DIAGNOSIS — C73 Malignant neoplasm of thyroid gland: Secondary | ICD-10-CM | POA: Diagnosis not present

## 2023-03-16 DIAGNOSIS — G4700 Insomnia, unspecified: Secondary | ICD-10-CM | POA: Diagnosis not present

## 2023-03-16 DIAGNOSIS — I129 Hypertensive chronic kidney disease with stage 1 through stage 4 chronic kidney disease, or unspecified chronic kidney disease: Secondary | ICD-10-CM | POA: Diagnosis not present

## 2023-03-16 DIAGNOSIS — Z96652 Presence of left artificial knee joint: Secondary | ICD-10-CM | POA: Diagnosis not present

## 2023-03-16 DIAGNOSIS — R6889 Other general symptoms and signs: Secondary | ICD-10-CM | POA: Diagnosis not present

## 2023-03-16 DIAGNOSIS — Z96611 Presence of right artificial shoulder joint: Secondary | ICD-10-CM | POA: Diagnosis not present

## 2023-03-16 DIAGNOSIS — G8929 Other chronic pain: Secondary | ICD-10-CM | POA: Diagnosis not present

## 2023-03-16 DIAGNOSIS — M069 Rheumatoid arthritis, unspecified: Secondary | ICD-10-CM | POA: Diagnosis not present

## 2023-03-16 DIAGNOSIS — Z96612 Presence of left artificial shoulder joint: Secondary | ICD-10-CM | POA: Diagnosis not present

## 2023-03-16 DIAGNOSIS — I4891 Unspecified atrial fibrillation: Secondary | ICD-10-CM | POA: Diagnosis not present

## 2023-03-16 DIAGNOSIS — Z9181 History of falling: Secondary | ICD-10-CM | POA: Diagnosis not present

## 2023-03-16 DIAGNOSIS — N182 Chronic kidney disease, stage 2 (mild): Secondary | ICD-10-CM | POA: Diagnosis not present

## 2023-03-16 DIAGNOSIS — I1310 Hypertensive heart and chronic kidney disease without heart failure, with stage 1 through stage 4 chronic kidney disease, or unspecified chronic kidney disease: Secondary | ICD-10-CM | POA: Diagnosis not present

## 2023-03-16 DIAGNOSIS — Z96642 Presence of left artificial hip joint: Secondary | ICD-10-CM | POA: Diagnosis not present

## 2023-03-16 DIAGNOSIS — E876 Hypokalemia: Secondary | ICD-10-CM | POA: Diagnosis not present

## 2023-03-16 DIAGNOSIS — M810 Age-related osteoporosis without current pathological fracture: Secondary | ICD-10-CM | POA: Diagnosis not present

## 2023-03-16 DIAGNOSIS — D631 Anemia in chronic kidney disease: Secondary | ICD-10-CM | POA: Diagnosis not present

## 2023-03-16 LAB — BPAM RBC
Blood Product Expiration Date: 202408312359
ISSUE DATE / TIME: 202408130809
Unit Type and Rh: 6200

## 2023-03-16 MED ORDER — TRAMADOL HCL 50 MG PO TABS: 50.0000 mg | ORAL_TABLET | Freq: Two times a day (BID) | ORAL | 0 refills | Status: DC | PRN

## 2023-03-16 MED ORDER — POTASSIUM CHLORIDE CRYS ER 20 MEQ PO TBCR
20.0000 meq | EXTENDED_RELEASE_TABLET | Freq: Every day | ORAL | 3 refills | Status: DC
Start: 2023-03-16 — End: 2023-06-20

## 2023-03-16 MED ORDER — ALPRAZOLAM 0.25 MG PO TABS
0.2500 mg | ORAL_TABLET | Freq: Two times a day (BID) | ORAL | 0 refills | Status: DC | PRN
Start: 2023-03-16 — End: 2023-04-12

## 2023-03-16 MED ORDER — OXYCODONE-ACETAMINOPHEN 5-325 MG PO TABS: 1.0000 | ORAL_TABLET | Freq: Three times a day (TID) | ORAL | 0 refills | Status: DC | PRN

## 2023-03-16 MED ORDER — LEVOTHYROXINE SODIUM 50 MCG PO TABS: 50.0000 ug | ORAL_TABLET | Freq: Every day | ORAL | 1 refills | Status: DC

## 2023-03-16 MED ORDER — FERROUS SULFATE 325 (65 FE) MG PO TBEC
325.0000 mg | DELAYED_RELEASE_TABLET | Freq: Every day | ORAL | 3 refills | Status: DC
Start: 2023-03-16 — End: 2023-10-24

## 2023-03-16 MED ORDER — ACETAMINOPHEN 500 MG PO TABS: 500.0000 mg | ORAL_TABLET | Freq: Four times a day (QID) | ORAL | 0 refills | Status: DC | PRN

## 2023-03-16 NOTE — Discharge Instructions (Signed)
1)Repeat CBC and Renal Panel blood test to include phosphorous and calcium blood test every Monday starting 03/21/2023 for 3 straight weeks  2)Repeat TSH Blood test in 3 weeks  3)Avoid ibuprofen/Advil/Aleve/Motrin/Goody Powders/Naproxen/BC powders/Meloxicam/Diclofenac/Indomethacin and other Nonsteroidal anti-inflammatory medications as these will make you more likely to bleed and can cause stomach ulcers, can also cause Kidney problems.

## 2023-03-16 NOTE — Progress Notes (Signed)
Physical Therapy Treatment Patient Details Name: Kathy [name] MRN: [id number] DOB: 27-Jun-1941 Today's Date: 03/16/2023   History of Present Illness 82 y.o. female with medical history significant for hypercalcemia and AKI, rheumatoid arthritis, GI bleed secondary to gastrojejunal ulcer in 2023, new onset atrial fibrillation in July 2024 now on Eliquis and recurrent/persistent hypercalcemia readmitted on 03/11/2023 with elevated calcium levels in the setting of exogenous calcium intake the patient with history of parathyroid malignancy in 2006    PT Comments  Pt up in chair and receptive to therapy.  Min assist to stand from chair with minimal cues for UE placement.  Pt able to ambulate full length of hallway using RW.  Pt with slow gait pattern with decreased stride.  No pain or issues reported,  some fatigue when returned to chair.  Overall progressing well and anticipated transferred to skilled care today.       Can travel by private vehicle     No  Equipment Recommendations  None recommended by PT    Recommendations for Other Services OT consult     Precautions / Restrictions Precautions Precautions: None Restrictions Weight Bearing Restrictions: No     Mobility  Bed Mobility Overal bed mobility: Modified Independent Bed Mobility: Rolling, Supine to Sit, Sit to Supine                Transfers Overall transfer level: Needs assistance Equipment used: Rolling walker (2 wheels), None Transfers: Sit to/from Stand, Bed to chair/wheelchair/BSC Sit to Stand: Min assist           General transfer comment: pt able to use UE and stand with min assist/increased time    Ambulation/Gait Ambulation/Gait assistance: Contact guard assist Gait Distance (Feet): 100 Feet Assistive device: Rolling walker (2 wheels) Gait Pattern/deviations: Decreased step length - right, Decreased step length - left, Narrow base of support, Trunk flexed                               PT Goals (current goals can now be found in the care plan section)      Frequency    Min 3X/week      PT Plan  Continue to progress towards goals.       AM-PAC PT "6 Clicks" Mobility   Outcome Measure  Help needed turning from your back to your side while in a flat bed without using bedrails?: A Little Help needed moving from lying on your back to sitting on the side of a flat bed without using bedrails?: A Little Help needed moving to and from a bed to a chair (including a wheelchair)?: A Lot Help needed standing up from a chair using your arms (e.g., wheelchair or bedside chair)?: A Little Help needed to walk in hospital room?: A Little Help needed climbing 3-5 steps with a railing? : A Lot 6 Click Score: 16    End of Session Equipment Utilized During Treatment: Gait belt Activity Tolerance: Patient tolerated treatment well;Patient limited by lethargy Patient left: in bed;with call bell/phone within reach;with bed alarm set Nurse Communication: Mobility status PT Visit Diagnosis: Other abnormalities of gait and mobility (R26.89);Muscle weakness (generalized) (M62.81)     Time: 4098-1191 PT Time Calculation (min) (ACUTE ONLY): 20 min  Charges:    $Gait Training: 23-37 mins PT General Charges $$ ACUTE PT VISIT: 1 Visit  Lurena Nida, PTA/CLT [facility] Ph: [phone]    Lurena Nida 03/16/2023, 10:37 AM

## 2023-03-16 NOTE — TOC Transition Note (Signed)
Transition of Care [facility]) - CM/SW Discharge Note   Patient Details  Name: Kathy [name] MRN: [id number] Date of Birth: [birth year]  [facility] CM/SW Contact:  Leitha Bleak, RN Phone Number: 03/16/2023, 10:38 AM   Clinical Narrative:   Patient discharging to [facility]. DC summary sent to [facility], confirmed bed is ready. RN will call report. Medical necessity printed. TOC will schedule EMS when RN is ready.     Final next level of care: Skilled Nursing Facility Barriers to Discharge: Barriers Resolved   Patient Goals and CMS Choice CMS Medicare.gov Compare Post Acute Care list provided to:: Patient    Discharge Placement                 Patient to be transferred to facility by: EMS Name of family member notified: Patient updating family and friends Patient and family notified of of transfer: 03/16/23  Discharge Plan and Services Additional resources added to the After Visit Summary for                 Social Determinants of Health (SDOH) Interventions SDOH Screenings   Food Insecurity: No Food Insecurity (03/11/2023)  Housing: Low Risk  (03/11/2023)  Transportation Needs: No Transportation Needs (03/11/2023)  Utilities: Not At Risk (03/11/2023)  Alcohol Screen: Low Risk  (09/14/2021)  Depression (PHQ2-9): Low Risk  (02/21/2023)  Financial Resource Strain: Low Risk  (09/14/2021)  Physical Activity: Sufficiently Active (09/14/2021)  Social Connections: Moderately Isolated (09/14/2021)  Stress: No Stress Concern Present (09/14/2021)  Tobacco Use: Low Risk  (03/11/2023)    Readmission Risk Interventions    03/16/2023   10:32 AM  Readmission Risk Prevention Plan  Transportation Screening Complete  PCP or Specialist Appt within 3-5 Days Complete  HRI or Home Care Consult Complete  Social Work Consult for Recovery Care Planning/Counseling Complete  Palliative Care Screening Not Applicable  Medication Review [employer]) Complete

## 2023-03-16 NOTE — Progress Notes (Signed)
Patient has rested well.  No acute events overnight.

## 2023-03-16 NOTE — Discharge Summary (Signed)
Kathy [name], is a 82 y.o. female  DOB 09/10/1940  MRN [id number].  Admission date:  03/11/2023  Admitting Physician  Buena Irish, MD  Discharge Date:  03/16/2023   Primary MD  [name], MD  Recommendations for primary care physician for things to follow:   1)Repeat CBC and Renal Panel blood test to include phosphorous and calcium blood test every Monday starting 03/21/2023 for 3 straight weeks  2)Repeat TSH Blood test in 3 weeks  3)Avoid ibuprofen/Advil/Aleve/Motrin/Goody Powders/Naproxen/BC powders/Meloxicam/Diclofenac/Indomethacin and other Nonsteroidal anti-inflammatory medications as these will make you more likely to bleed and can cause stomach ulcers, can also cause Kidney problems.   Admission Diagnosis  Hypercalcemia [E83.52]  Discharge Diagnosis  Hypercalcemia [E83.52]    Principal Problem:   Hypercalcemia Active Problems:   Rheumatoid arthritis (HCC)   Malignant tumor of thyroid gland (HCC)   RBBB (right bundle branch block with left anterior fascicular block)   AKI (acute kidney injury) (HCC)     Past Medical History:  Diagnosis Date   Abnormal findings on diagnostic imaging of heart and coronary circulation    Anemia    FROM BLEEDING ULCER   Anxiety    takes Alprazolam daily as needed   Arthritis    dx with RA 2017   Bariatric surgery status    Cataract    Diverticulosis    Gastro-esophageal reflux disease with esophagitis    Gastrojejunal ulcer with hemorrhage    Headache(784.0)    occasionally   History of blood transfusion    no abnormal reaction noted   History of bronchitis    last time many yrs ago   Hyperlipidemia    PT DENIES THIS DX -  ON NO MEDS AND NO ONE HAS TOLD HER   Hypertension    takes Amlodipine daily   Hypothyroidism    takes Synthroid daily   Insomnia    Joint pain    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   Localized edema     Nocturia    Numbness    occasionally left arm at night   Obesity    Osteoporosis    takes Fosamax weekly   Pain in joint involving pelvic region and thigh    Peripheral edema    takes Lasix daily as needed   PONV (postoperative nausea and vomiting)    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/2017   Rheumatoid arthritis, unspecified (HCC)    Rotator cuff arthropathy, 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   Sleep apnea    Phreesia 08/22/2020   Stomach ulcer    Thyroid disease    Phreesia 08/22/2020   Unspecified injury of muscle(s) and tendon(s) of the rotator cuff of left shoulder, subsequent encounter    Unspecified osteoarthritis, unspecified site    Wears glasses    Wears partial dentures     Past Surgical History:  Procedure Laterality Date   ABDOMINAL HYSTERECTOMY     partial   cataract surgery Bilateral    CHOLECYSTECTOMY     COLONOSCOPY  ESOPHAGOGASTRODUODENOSCOPY (EGD) WITH PROPOFOL N/A 01/11/2022   Procedure: ESOPHAGOGASTRODUODENOSCOPY (EGD) WITH PROPOFOL;  Surgeon: Iva Boop, MD;  Location: [facility] ENDOSCOPY;  Service: Gastroenterology;  Laterality: N/A;   EYE SURGERY     CATARACTS BOTH   gastric bypass surgery     HOT HEMOSTASIS N/A 01/11/2022   Procedure: HOT HEMOSTASIS (ARGON PLASMA COAGULATION/BICAP);  Surgeon: Iva Boop, MD;  Location: [facility] ENDOSCOPY;  Service: Gastroenterology;  Laterality: N/A;   JOINT REPLACEMENT Bilateral    hip   REVERSE SHOULDER ARTHROPLASTY Right 05/11/2016   Procedure: REVERSE SHOULDER ARTHROPLASTY;  Surgeon: Teryl Lucy, MD;  Location: [facility] OR;  Service: Orthopedics;  Laterality: Right;   REVISION TOTAL HIP ARTHROPLASTY Left 10/03/2013   DR Turner Daniels   ROTATOR CUFF REPAIR     SCLEROTHERAPY  01/11/2022   Procedure: Susa Day;  Surgeon: Iva Boop, MD;  Location: [facility] ENDOSCOPY;  Service: Gastroenterology;;   TOTAL HIP REVISION Left 10/03/2013   Procedure: TOTAL HIP REVISION- left;  Surgeon: Nestor Lewandowsky, MD;  Location: [facility] OR;   Service: Orthopedics;  Laterality: Left;   TOTAL KNEE ARTHROPLASTY Left 11/29/2017   Procedure: LEFT TOTAL KNEE ARTHROPLASTY;  Surgeon: Teryl Lucy, MD;  Location: [facility] OR;  Service: Orthopedics;  Laterality: Left;   TOTAL SHOULDER ARTHROPLASTY Left 11/09/2016   Procedure: TOTAL REVERSE SHOULDER ARTHROPLASTY;  Surgeon: Teryl Lucy, MD;  Location: [facility] OR;  Service: Orthopedics;  Laterality: Left;   TOTAL SHOULDER REPLACEMENT Left 10/2016   TOTAL THYROIDECTOMY     UPPER GASTROINTESTINAL ENDOSCOPY      HPI  from the history and physical done on the day of admission:    HPI: Kathy [name] is a 82 y.o. female with medical history significant for hypercalcemia and AKI, rheumatoid arthritis, GI bleed secondary to gastrojejunal ulcer in 2023, new onset atrial fibrillation in July 2024 now on Eliquis.  The patient was hospitalized for hypercalcemia in July 2024. She was on 1500 mg of calcium for supplementation every day.  After her calcium was corrected she was discharged on 500 mg of calcium daily and Eliquis for the A-fib.  She followed up with her primary care physician and 2 weeks ago her calcium was noted to be 10.  She is compliant with her medication.  This morning about 5 AM the patient slid out of the bed onto the floor and could not get up.  She was eventually able to call a neighbor who came down helped her and then called EMS.  Per the ED provider the patient was a little confused.  By the time of my evaluation she was able to give me a fairly good history.  She does have chronic pain from her rheumatoid arthritis and walks with a cane but she does live alone and is independent of her ADLs. After the patient's hospitalization in July she did go to rehab before going back home. The patient's thyroid cancer was in 2006.  She sees an endocrinologist and has a yearly thyroid scan.  Her endocrinologist also manages her supplementation.    Review of Systems: As mentioned in the history of  present illness. All other systems reviewed and are negative.   Hospital Course:     Brief Narrative:   82 y.o. female with medical history significant for hypercalcemia and AKI, rheumatoid arthritis, GI bleed secondary to gastrojejunal ulcer in 2023, new onset atrial fibrillation in July 2024 now on Eliquis and recurrent/persistent hypercalcemia readmitted on 03/11/2023 with elevated calcium levels in the setting of exogenous  calcium intake the patient with history of parathyroid malignancy in 2006    -Assessment and Plan: 1)Persistent Hypercalcemia--review of records reviewed WAS NORMAL BACK IN DECEMBER 2023 -Patient was started on calcium supplements earlier this year, initially she took 1500 mg of calcium daily... 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  She follows with Dr. Horald Pollen of endocrinology  -TSH is low at 0.089 -Initially held levothyroxine -Okay to restart levothyroxine at 50 mcg daily from 88 mcg daily -Recheck TSH in 3 to 4 weeks   5)HTN--- stop amlodipine due to soft BP   6) hypophosphatemia/hypokalemia--replaced electrolytes -Repeat renal panel with phosphorous and calcium every Monday x 3 weeks advised    7) acute on  chronic anemia--probably related to underlying CKD and anemia of chronic disease in the setting of rheumatoid arthritis --Hgb was down to 6.6  -Patient received 2 units of PRBC on 03/20/2023 -Patient also received Aranesp =-Review of records reveals chronic anemia dating back to 2019 ---No obvious bleeding concerns -Continue iron supplementation -Repeat CBC every Monday for the next 3 weeks   Disposition: The patient is from: Home              Anticipated d/c is to: SNF   Discharge Condition: Stable  Follow UP   Contact information for follow-up providers     [name], MD Follow up in 3 week(s).   Specialty: Internal Medicine Contact information: [address] [place] [address] [phone]              Contact information for after-discharge care     Destination     [facility] Preferred [facility] .   Service: Skilled Nursing Contact information: [address] [place] [address] [phone]                    Diet and Activity recommendation:  As advised  Discharge Instructions    Discharge Instructions     Call MD for:  difficulty breathing, headache or visual disturbances   Complete by: As directed    Call MD for:  persistant dizziness or light-headedness   Complete by: As directed    Call MD for:  persistant nausea and vomiting   Complete by: As directed    Call MD for:  temperature >100.4   Complete by: As directed    Diet - low sodium heart healthy   Complete by: As directed    Discharge instructions   Complete by: As directed    1)Repeat CBC and Renal Panel blood test to include phosphorous and calcium blood test every Monday starting 03/21/2023 for 3 straight weeks  2)Repeat TSH Blood test in 3 weeks  3)Avoid ibuprofen/Advil/Aleve/Motrin/Goody Powders/Naproxen/BC powders/Meloxicam/Diclofenac/Indomethacin and other  Nonsteroidal anti-inflammatory medications as these will make you more likely to  bleed and can cause stomach ulcers, can also cause Kidney problems.   Increase activity slowly   Complete by: As directed         Discharge Medications     Allergies as of 03/16/2023       Reactions   Macrobid [nitrofurantoin] Other (See Comments)   Stomach pain        Medication List     STOP taking these medications    acetaminophen 650 MG CR tablet Commonly known as: TYLENOL Replaced by: acetaminophen 500 MG tablet   amLODipine 5 MG tablet Commonly known as: NORVASC   hydroxychloroquine 200 MG tablet Commonly known as: PLAQUENIL       TAKE these medications    acetaminophen 500 MG tablet Commonly known as: TYLENOL Take 1 tablet (500 mg total) by mouth every 6 (six) hours as needed for mild pain, headache or fever (or Fever >/= 101). Replaces: acetaminophen 650 MG CR tablet   ALPRAZolam 0.25 MG tablet Commonly known as: XANAX Take 1 tablet (0.25 mg total) by mouth 2 (two) times daily as needed for anxiety or sleep. TAKE 1 TABLET BY MOUTH TWICE DAILY AS NEEDED FOR ANXIETY OR SLEEP Strength: 0.25 mg What changed: See the new instructions.   calcium-vitamin D 500-5 MG-MCG tablet Commonly known as: OSCAL WITH D Take 1 tablet by mouth daily with breakfast.   diclofenac Sodium 1 % Gel Commonly known as: VOLTAREN APPLY (1) GRAM TO AFFECTED AREA DAILY. What changed:  how much to take how to take this when to take this additional instructions   ferrous sulfate 325 (65 FE) MG EC tablet Take 1 tablet (325 mg total) by mouth daily.   FISH OIL PO Take 1 capsule by mouth daily.   furosemide 20 MG tablet Commonly known as: LASIX Take 1 tablet (20 mg total) by mouth 2 (two) times daily. What changed:  when to take this reasons to take this   Humira (2 Pen) 40 MG/0.4ML pen Generic drug: adalimumab Inject 40 mg into the skin every Saturday.   levothyroxine 50 MCG  tablet Commonly known as: Synthroid Take 1 tablet (50 mcg total) by mouth daily. What changed:  medication strength how much to take when to take this   multivitamin with minerals Tabs tablet Take 1 tablet by mouth every morning.   Narcan 4 MG/0.1ML Liqd nasal spray kit Generic drug: naloxone Place 1 spray into the nose as needed (opioid reversal).   omeprazole 40 MG capsule Commonly known as: PRILOSEC Take 1 capsule (40 mg total) by mouth daily before breakfast. Open capsule and swallow granules with liquid or applesauce   oxybutynin 10 MG 24 hr tablet Commonly known as: Ditropan XL Take 1 tablet (10 mg total) by mouth at bedtime.   oxyCODONE-acetaminophen 5-325 MG tablet Commonly known as: PERCOCET/ROXICET Take 1 tablet by mouth every 8 (eight) hours as needed for moderate pain or severe pain (pain not relieved by tramadol). What changed: reasons to take this   potassium chloride SA 20 MEQ tablet Commonly known as: KLOR-CON M Take 1 tablet (20 mEq total) by mouth daily. TAKE 1 TABLET (20 MEQ) BY MOUTH DAILY PRN with Lasix (EDEMA)   Prolia 60 MG/ML Sosy injection Generic drug: denosumab inject 60mg  Subcutaneous every 6 months 180 days   traMADol 50 MG tablet Commonly known as: ULTRAM Take 1 tablet (50 mg total) by mouth every 12 (twelve) hours as needed for moderate pain. What changed: when to take this  Major procedures and Radiology Reports - PLEASE review detailed and final reports for all details, in brief -   CT CHEST ABDOMEN PELVIS W CONTRAST  Addendum Date: 03/11/2023   ADDENDUM REPORT: 03/11/2023 16:34 ADDENDUM: The original report was by Dr. Gaylyn Rong. The following addendum is by Dr. Gaylyn Rong: Please note that while series 2 raises the question of heterogeneous density in the pulmonary arteries which could be an indicator of pulmonary embolus, the IMAR images in series 3 clear up some of the streak artifact and this heterogeneity in the  pulmonary arteries appears to essentially resolve. However, please note that today's exam was not protocol to assess for pulmonary embolus, and has reduced accuracy for pulmonary embolus especially due to the degree of streak artifact. Moreover, one additional point, there is some asymmetric density along the left iliopsoas tendon anterior to the hip, probably from a periprosthetic fluid collection tracking along the iliopsoas bursa. Electronically Signed   By: Gaylyn Rong M.D.   On: 03/11/2023 16:34   Result Date: 03/11/2023 CLINICAL DATA:  Weight loss, hypercalcemia EXAM: CT CHEST, ABDOMEN, AND PELVIS WITH CONTRAST TECHNIQUE: Multidetector CT imaging of the chest, abdomen and pelvis was performed following the standard protocol during bolus administration of intravenous contrast. RADIATION DOSE REDUCTION: This exam was performed according to the departmental dose-optimization program which includes automated exposure control, adjustment of the mA and/or kV according to patient size and/or use of iterative reconstruction technique. CONTRAST:  75mL OMNIPAQUE IOHEXOL 300 MG/ML  SOLN COMPARISON:  Renal ultrasound 01/25/2023 FINDINGS: CT CHEST FINDINGS Cardiovascular: Atherosclerotic vascular calcification of the thoracic aorta. Mild cardiomegaly. Mediastinum/Nodes: Unremarkable Lungs/Pleura: Volume loss with bandlike density in the left lower lobe favoring atelectasis. There is also milder dependent subsegmental atelectasis in the right lower lobe. No lung mass identified. Musculoskeletal: Bilateral reverse shoulder arthroplasties. Bony demineralization. Lower cervical and thoracic spondylosis. Irregular right eighth and tenth ribs, some of this may be from motion, cannot exclude age indeterminate rib fractures. CT ABDOMEN PELVIS FINDINGS Hepatobiliary: No significant parenchymal lesion identified. There is likely some vascular shunting posteriorly in the inferior margin of the right hepatic lobe. Substantial  extrahepatic biliary dilatation, common hepatic duct up to 1.7 cm. Cholecystectomy. Distal CBD up to 1.0 cm. Pancreas: Abnormal stranding and edema particularly around the pancreatic head and pancreatic body, with some relative hypodensity in the pancreatic head and body compared to the pancreatic tail. The appearance may be due to pancreatitis and edema, strictly speaking a small infiltrative pancreatic malignancy is not completely excluded. Spleen: Small size, otherwise unremarkable. Adrenals/Urinary Tract: No hydronephrosis. Nonobstructive 5 mm left mid kidney calculus. Mild cortical thinning compatible with mild atrophy. Stomach/Bowel: Prior gastric bypass. Sigmoid colon diverticulosis with scattered diverticula of the descending colon. Proximal wall thickening in the descending colon likely ascribe a bowl to nondistention although segmental colitis is not excluded. There is some edema in this vicinity which could be from the pancreas, less likely to be related to the colon. Normal appendix. Vascular/Lymphatic: Atherosclerosis is present, including aortoiliac atherosclerotic disease. No substantial stenosis in the celiac trunk or SMA. Reproductive: Uterus absent, adnexa unremarkable. Other: Trace ascites along the left paracolic gutter. Musculoskeletal: Bilateral hip prostheses with substantial lucency around the left acetabular plate component compatible with osteolysis. There is some irregularity of the quadrilateral plate and potentially some stress reaction involving the medial aspect of the left superior pubic ramus. Some of this osteolysis has been present back through 08/22/2013. There is also some demineralization of the right superior pubic  ramus laterally especially anteriorly. Interbody fusion at L2-3. Lumbar spondylosis and degenerative disc disease causing mild multilevel impingement. IMPRESSION: 1. Abnormal stranding and edema around the pancreatic head and pancreatic body, with some relative  hypodensity in the pancreatic head and body compared to the pancreatic tail. The appearance may be due to pancreatitis and edema, strictly speaking a small infiltrative pancreatic malignancy is not completely excluded. Correlate with lipase levels and consider follow up pancreatic protocol MRI with and without contrast when feasible. 2. Substantial extrahepatic biliary dilatation, common hepatic duct up to 1.7 cm. This could be related to the pancreatic process. 3. Mild cardiomegaly. 4. Irregular right eighth and tenth ribs, some of this may be from motion, cannot exclude age indeterminate rib fractures. 5. Sigmoid colon diverticulosis. 6. Proximal wall thickening in the descending colon, probably from nondistention although segmental colitis is not excluded. 7. Nonobstructive 5 mm left mid kidney calculus. 8. Bony demineralization. 9. 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 There is some irregularity of the left quadrilateral plate and potentially some stress reaction involving the medial aspect of the left superior pubic ramus. Some of this osteolysis has been present back through 08/22/2013. 10. Lumbar spondylosis and degenerative disc disease causing mild multilevel impingement. 11. Prior gastric bypass. 12. Aortic atherosclerosis. Aortic Atherosclerosis (ICD10-I70.0). Electronically Signed: By: Gaylyn Rong M.D. On: 03/11/2023 16:21   MR Brain W and Wo Contrast  Result Date: 03/11/2023 CLINICAL DATA:  Metastatic disease evaluation. EXAM: MRI HEAD WITHOUT AND WITH CONTRAST TECHNIQUE: Multiplanar, multiecho pulse sequences of the brain and surrounding structures were obtained without and with intravenous contrast. CONTRAST:  7mL GADAVIST GADOBUTROL 1 MMOL/ML IV SOLN COMPARISON:  Same-day CT head FINDINGS: Brain: There is no acute intracranial hemorrhage, extra-axial fluid collection, or acute infarct. There is mild for age parenchymal volume loss with  prominence of the ventricular system and extra-axial CSF spaces. There is minimal background chronic small-vessel ischemic change The pituitary and suprasellar region are normal. There is no mass lesion or abnormal enhancement. There is no mass effect or midline shift. Vascular: Normal flow voids. Skull and upper cervical spine: There are numerous calvarial lesions which demonstrate elevated DWI signal. Some of the lesions are intrinsically T1 hyperintense (for example 18-92, 18-84). Sinuses/Orbits: The paranasal sinuses are clear. Bilateral lens implants are in place. The globes and orbits are otherwise unremarkable. Other: The mastoid air cells and middle ear cavities are clear. IMPRESSION: 1. No evidence of intracranial metastatic disease or other acute intracranial pathology. 2. Numerous calvarial lesions as seen on CT are primarily T1 hyperintense which is more typical of benign lesions, with metastatic disease considered less likely. Electronically Signed   By: Lesia Hausen M.D.   On: 03/11/2023 16:23   CT Head Wo Contrast  Result Date: 03/11/2023 CLINICAL DATA:  Head trauma, abnormal mental status (Age 19-64y) EXAM: CT HEAD WITHOUT CONTRAST TECHNIQUE: Contiguous axial images were obtained from the base of the skull through the vertex without intravenous contrast. RADIATION DOSE REDUCTION: This exam was performed according to the departmental dose-optimization program which includes automated exposure control, adjustment of the mA and/or kV according to patient size and/or use of iterative reconstruction technique. COMPARISON:  CT Head 06/09/17, 01/24/23 FINDINGS: Brain: No evidence of acute infarction, hemorrhage, hydrocephalus, extra-axial collection or mass lesion/mass effect. Sequela of mild chronic microvascular ischemic change Vascular: No hyperdense vessel or unexpected calcification. Skull: There are multiple scattered lucent calvarial lesions, some of which are new compared to 2018, for example  the  lesion in the right parietal bone (series 3, image 40 Sinuses/Orbits: No middle ear or mastoid effusion. Paranasal sinuses are clear. Bilateral lens replacement. Orbits are otherwise unremarkable. Other: None. IMPRESSION: 1. No CT evidence of intracranial injury 2. Multiple scattered lucent calvarial lesions, some of which are new compared to 2018. While nonspecific, differential considerations include metastatic disease or multiple myeloma. Recommend correlation with clinical history and consider further evaluation with a contrast-enhanced MRI of the brain. Electronically Signed   By: Lorenza Cambridge M.D.   On: 03/11/2023 13:47   DG Chest Portable 1 View  Result Date: 03/11/2023 CLINICAL DATA:  Pain after trauma.  Weakness EXAM: PORTABLE CHEST 1 VIEW COMPARISON:  X-ray 01/23/2023 FINDINGS: Enlarged cardiopericardial silhouette with a tortuous and ectatic aorta. No consolidation, pneumothorax or effusion. No edema. Degenerative changes seen along the spine. 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 IMPRESSION: Enlarged cardiopericardial silhouette.  No edema or consolidation. Electronically Signed   By: Karen Kays M.D.   On: 03/11/2023 13:42    Today   Subjective    Kathy [name] today has no new complaints -Ambulatory with physical therapist without significant dyspnea on exertion, no dizziness no palpitations and no hypoxia -Post ambulation O2 sats in the mid 90s   Patient has been seen and examined prior to discharge   Objective   Blood pressure 102/61, pulse 61, temperature 98.6 F (37 C), temperature source Oral, resp. rate 17, height 5\' 1"  (1.549 m), weight 66 kg, SpO2 98%.   Intake/Output Summary (Last 24 hours) at 03/16/2023 1009 Last data filed at 03/16/2023 0542 Gross per 24 hour  Intake 627 ml  Output 2450 ml  Net -1823 ml    Exam Gen:- Awake Alert, no acute distress HEENT:- [place].AT, No sclera icterus Neck-Supple Neck,No JVD,.  Lungs-  CTAB , fair symmetrical air  movement CV- S1, S2 normal, regular  Abd-  +ve B.Sounds, Abd Soft, No tenderness,    Extremity/Skin:- No  edema, pedal pulses present  Psych-affect is appropriate, oriented x3 Neuro-generalized weakness, no new focal deficits, no tremors MSK-rheumatoid arthritis related deformities of with ulnar deviation   Data Review   CBC w Diff:  Lab Results  Component Value Date   WBC 5.3 03/16/2023   HGB 8.3 (L) 03/16/2023   HGB 10.2 (L) 07/19/2022   HCT 24.5 (L) 03/16/2023   HCT 30.2 (L) 07/19/2022   PLT 150 03/16/2023   PLT 223 07/19/2022   LYMPHOPCT 20 03/11/2023   MONOPCT 11 03/11/2023   EOSPCT 0 03/11/2023   BASOPCT 0 03/11/2023   CMP:  Lab Results  Component Value Date   NA 133 (L) 03/15/2023   NA 135 02/23/2023   K 3.2 (L) 03/15/2023   CL 108 03/15/2023   CO2 19 (L) 03/15/2023   BUN 16 03/15/2023   BUN 26 02/23/2023   CREATININE 1.08 (H) 03/15/2023   CREATININE 0.84 08/25/2020   GLU 84 07/06/2019   PROT 8.7 (H) 03/12/2023   PROT 6.9 03/18/2021   ALBUMIN <1.5 (L) 03/15/2023   ALBUMIN 3.2 (L) 03/18/2021   BILITOT 0.4 03/12/2023   BILITOT 0.4 03/18/2021   ALKPHOS 52 03/12/2023   AST 29 03/12/2023   ALT 19 03/12/2023   Total Discharge time is about 33 minutes  Shon Hale M.D on 03/16/2023 at 10:09 AM  Go to [email or web address] -  for contact info  [facility] - Office  [phone]

## 2023-03-18 LAB — UPEP/UIFE/LIGHT CHAINS/TP, 24-HR UR
% BETA, Urine: 89.3 %
ALPHA 1 URINE: 1 %
Albumin, U: 4.2 %
Alpha 2, Urine: 4.1 %
Free Kappa Lt Chains,Ur: 1799.73 mg/L — ABNORMAL HIGH (ref 1.17–86.46)
Free Kappa/Lambda Ratio: 899.87 — ABNORMAL HIGH (ref 1.83–14.26)
Free Lambda Lt Chains,Ur: 2 mg/L (ref 0.27–15.21)
GAMMA GLOBULIN URINE: 1.4 %
M-SPIKE %, Urine: 69.4 % — ABNORMAL HIGH
M-Spike, Mg/24 Hr: 208 mg/(24.h) — ABNORMAL HIGH
Total Protein, Urine-Ur/day: 300 mg/(24.h) — ABNORMAL HIGH (ref 30–150)
Total Protein, Urine: 60 mg/dL
Total Volume: 500

## 2023-03-21 DIAGNOSIS — M6281 Muscle weakness (generalized): Secondary | ICD-10-CM | POA: Diagnosis not present

## 2023-03-21 DIAGNOSIS — I959 Hypotension, unspecified: Secondary | ICD-10-CM | POA: Diagnosis not present

## 2023-03-21 DIAGNOSIS — G4700 Insomnia, unspecified: Secondary | ICD-10-CM | POA: Diagnosis not present

## 2023-03-21 DIAGNOSIS — E039 Hypothyroidism, unspecified: Secondary | ICD-10-CM | POA: Diagnosis not present

## 2023-03-21 DIAGNOSIS — I1310 Hypertensive heart and chronic kidney disease without heart failure, with stage 1 through stage 4 chronic kidney disease, or unspecified chronic kidney disease: Secondary | ICD-10-CM | POA: Diagnosis not present

## 2023-03-21 DIAGNOSIS — N179 Acute kidney failure, unspecified: Secondary | ICD-10-CM | POA: Diagnosis not present

## 2023-03-21 DIAGNOSIS — E119 Type 2 diabetes mellitus without complications: Secondary | ICD-10-CM | POA: Diagnosis not present

## 2023-03-21 DIAGNOSIS — E876 Hypokalemia: Secondary | ICD-10-CM | POA: Diagnosis not present

## 2023-03-21 DIAGNOSIS — D631 Anemia in chronic kidney disease: Secondary | ICD-10-CM | POA: Diagnosis not present

## 2023-03-21 DIAGNOSIS — G8929 Other chronic pain: Secondary | ICD-10-CM | POA: Diagnosis not present

## 2023-03-21 DIAGNOSIS — I480 Paroxysmal atrial fibrillation: Secondary | ICD-10-CM | POA: Diagnosis not present

## 2023-03-21 DIAGNOSIS — K219 Gastro-esophageal reflux disease without esophagitis: Secondary | ICD-10-CM | POA: Diagnosis not present

## 2023-03-22 DIAGNOSIS — I1310 Hypertensive heart and chronic kidney disease without heart failure, with stage 1 through stage 4 chronic kidney disease, or unspecified chronic kidney disease: Secondary | ICD-10-CM | POA: Diagnosis not present

## 2023-03-22 DIAGNOSIS — D631 Anemia in chronic kidney disease: Secondary | ICD-10-CM | POA: Diagnosis not present

## 2023-03-22 DIAGNOSIS — N179 Acute kidney failure, unspecified: Secondary | ICD-10-CM | POA: Diagnosis not present

## 2023-03-22 DIAGNOSIS — M6281 Muscle weakness (generalized): Secondary | ICD-10-CM | POA: Diagnosis not present

## 2023-03-22 DIAGNOSIS — M069 Rheumatoid arthritis, unspecified: Secondary | ICD-10-CM | POA: Diagnosis not present

## 2023-03-22 DIAGNOSIS — G8929 Other chronic pain: Secondary | ICD-10-CM | POA: Diagnosis not present

## 2023-03-22 DIAGNOSIS — E876 Hypokalemia: Secondary | ICD-10-CM | POA: Diagnosis not present

## 2023-03-22 DIAGNOSIS — G4700 Insomnia, unspecified: Secondary | ICD-10-CM | POA: Diagnosis not present

## 2023-03-22 DIAGNOSIS — K219 Gastro-esophageal reflux disease without esophagitis: Secondary | ICD-10-CM | POA: Diagnosis not present

## 2023-03-22 DIAGNOSIS — I480 Paroxysmal atrial fibrillation: Secondary | ICD-10-CM | POA: Diagnosis not present

## 2023-03-22 DIAGNOSIS — E039 Hypothyroidism, unspecified: Secondary | ICD-10-CM | POA: Diagnosis not present

## 2023-03-23 DIAGNOSIS — G8929 Other chronic pain: Secondary | ICD-10-CM | POA: Diagnosis not present

## 2023-03-23 DIAGNOSIS — I480 Paroxysmal atrial fibrillation: Secondary | ICD-10-CM | POA: Diagnosis not present

## 2023-03-23 DIAGNOSIS — N179 Acute kidney failure, unspecified: Secondary | ICD-10-CM | POA: Diagnosis not present

## 2023-03-23 DIAGNOSIS — I1310 Hypertensive heart and chronic kidney disease without heart failure, with stage 1 through stage 4 chronic kidney disease, or unspecified chronic kidney disease: Secondary | ICD-10-CM | POA: Diagnosis not present

## 2023-03-23 DIAGNOSIS — M069 Rheumatoid arthritis, unspecified: Secondary | ICD-10-CM | POA: Diagnosis not present

## 2023-03-23 DIAGNOSIS — K219 Gastro-esophageal reflux disease without esophagitis: Secondary | ICD-10-CM | POA: Diagnosis not present

## 2023-03-23 DIAGNOSIS — M6281 Muscle weakness (generalized): Secondary | ICD-10-CM | POA: Diagnosis not present

## 2023-03-23 DIAGNOSIS — E039 Hypothyroidism, unspecified: Secondary | ICD-10-CM | POA: Diagnosis not present

## 2023-03-23 DIAGNOSIS — D631 Anemia in chronic kidney disease: Secondary | ICD-10-CM | POA: Diagnosis not present

## 2023-03-25 DIAGNOSIS — E039 Hypothyroidism, unspecified: Secondary | ICD-10-CM | POA: Diagnosis not present

## 2023-03-25 DIAGNOSIS — G8929 Other chronic pain: Secondary | ICD-10-CM | POA: Diagnosis not present

## 2023-03-25 DIAGNOSIS — D631 Anemia in chronic kidney disease: Secondary | ICD-10-CM | POA: Diagnosis not present

## 2023-03-25 DIAGNOSIS — E876 Hypokalemia: Secondary | ICD-10-CM | POA: Diagnosis not present

## 2023-03-25 DIAGNOSIS — I480 Paroxysmal atrial fibrillation: Secondary | ICD-10-CM | POA: Diagnosis not present

## 2023-03-25 DIAGNOSIS — K219 Gastro-esophageal reflux disease without esophagitis: Secondary | ICD-10-CM | POA: Diagnosis not present

## 2023-03-25 DIAGNOSIS — N179 Acute kidney failure, unspecified: Secondary | ICD-10-CM | POA: Diagnosis not present

## 2023-03-25 DIAGNOSIS — M069 Rheumatoid arthritis, unspecified: Secondary | ICD-10-CM | POA: Diagnosis not present

## 2023-03-25 DIAGNOSIS — M6281 Muscle weakness (generalized): Secondary | ICD-10-CM | POA: Diagnosis not present

## 2023-03-25 DIAGNOSIS — I1310 Hypertensive heart and chronic kidney disease without heart failure, with stage 1 through stage 4 chronic kidney disease, or unspecified chronic kidney disease: Secondary | ICD-10-CM | POA: Diagnosis not present

## 2023-03-30 ENCOUNTER — Encounter: Payer: Self-pay | Admitting: Internal Medicine

## 2023-03-30 ENCOUNTER — Other Ambulatory Visit (HOSPITAL_COMMUNITY)
Admission: RE | Admit: 2023-03-30 | Discharge: 2023-03-30 | Disposition: A | Discharge status: Home / Self Care | Payer: 59 | Source: Ambulatory Visit | Attending: Internal Medicine | Admitting: Internal Medicine

## 2023-03-30 ENCOUNTER — Other Ambulatory Visit: Payer: Self-pay | Admitting: Internal Medicine

## 2023-03-30 ENCOUNTER — Other Ambulatory Visit (HOSPITAL_COMMUNITY): Payer: Self-pay

## 2023-03-30 ENCOUNTER — Ambulatory Visit (INDEPENDENT_AMBULATORY_CARE_PROVIDER_SITE_OTHER): Payer: 59 | Admitting: Internal Medicine

## 2023-03-30 DIAGNOSIS — Z09 Encounter for follow-up examination after completed treatment for conditions other than malignant neoplasm: Secondary | ICD-10-CM

## 2023-03-30 DIAGNOSIS — D509 Iron deficiency anemia, unspecified: Secondary | ICD-10-CM | POA: Diagnosis not present

## 2023-03-30 DIAGNOSIS — R5381 Other malaise: Secondary | ICD-10-CM

## 2023-03-30 DIAGNOSIS — M7989 Other specified soft tissue disorders: Secondary | ICD-10-CM

## 2023-03-30 DIAGNOSIS — E038 Other specified hypothyroidism: Secondary | ICD-10-CM | POA: Insufficient documentation

## 2023-03-30 DIAGNOSIS — R768 Other specified abnormal immunological findings in serum: Secondary | ICD-10-CM | POA: Insufficient documentation

## 2023-03-30 DIAGNOSIS — I10 Essential (primary) hypertension: Secondary | ICD-10-CM

## 2023-03-30 DIAGNOSIS — D649 Anemia, unspecified: Secondary | ICD-10-CM | POA: Insufficient documentation

## 2023-03-30 LAB — CBC WITH DIFFERENTIAL/PLATELET
Abs Immature Granulocytes: 0.01 10*3/uL (ref 0.00–0.07)
Basophils Absolute: 0 10*3/uL (ref 0.0–0.1)
Basophils Relative: 0 %
Eosinophils Absolute: 0 10*3/uL (ref 0.0–0.5)
Eosinophils Relative: 1 %
HCT: 27.9 % — ABNORMAL LOW (ref 36.0–46.0)
Hemoglobin: 9.1 g/dL — ABNORMAL LOW (ref 12.0–15.0)
Immature Granulocytes: 0 %
Lymphocytes Relative: 49 %
Lymphs Abs: 2.4 10*3/uL (ref 0.7–4.0)
MCH: 32.9 pg (ref 26.0–34.0)
MCHC: 32.6 g/dL (ref 30.0–36.0)
MCV: 100.7 fL — ABNORMAL HIGH (ref 80.0–100.0)
Monocytes Absolute: 0.6 10*3/uL (ref 0.1–1.0)
Monocytes Relative: 11 %
Neutro Abs: 1.9 10*3/uL (ref 1.7–7.7)
Neutrophils Relative %: 39 %
Platelets: 200 10*3/uL (ref 150–400)
RBC: 2.77 MIL/uL — ABNORMAL LOW (ref 3.87–5.11)
RDW: 17.4 % — ABNORMAL HIGH (ref 11.5–15.5)
WBC: 5 10*3/uL (ref 4.0–10.5)
nRBC: 2.4 % — ABNORMAL HIGH (ref 0.0–0.2)

## 2023-03-30 LAB — BASIC METABOLIC PANEL
Anion gap: 10 (ref 5–15)
BUN: 32 mg/dL — ABNORMAL HIGH (ref 8–23)
CO2: 23 mmol/L (ref 22–32)
Calcium: 10.6 mg/dL — ABNORMAL HIGH (ref 8.9–10.3)
Chloride: 102 mmol/L (ref 98–111)
Creatinine, Ser: 1.22 mg/dL — ABNORMAL HIGH (ref 0.44–1.00)
GFR, Estimated: 45 mL/min — ABNORMAL LOW (ref >60)
Glucose, Bld: 78 mg/dL (ref 70–99)
Potassium: 4.1 mmol/L (ref 3.5–5.1)
Sodium: 135 mmol/L (ref 135–145)

## 2023-03-30 LAB — TSH: TSH: 4.312 u[IU]/mL (ref 0.350–4.500)

## 2023-03-30 LAB — T4, FREE: Free T4: 0.93 ng/dL (ref 0.61–1.12)

## 2023-03-30 NOTE — Assessment & Plan Note (Addendum)
Lab Results  Component Value Date   TSH 0.089 (L) 03/12/2023   TSH low, no symptoms of overcorrection currently On Levothyroxine 50 mcg QD, decreased from 88 mcg due to oversupplemented thyroid Repeat TSH and free T4 Followed by Dr. Talmage Nap

## 2023-03-30 NOTE — Assessment & Plan Note (Signed)
BP Readings from Last 1 Encounters:  03/30/23 131/76   Well-controlled, DC Amlodipine as her BP is wnl without it Counseled for compliance with the medications Advised DASH diet and moderate exercise/walking, at least 150 mins/week

## 2023-03-30 NOTE — Assessment & Plan Note (Signed)
She has acute on chronic generalized weakness from recent episodes of hypercalcemia, microcytic anemia and fluctuant hypothyroidism She has RA, which also limits her mobility She has difficulty getting up from her bed, has had fall recently - needs hospital bed for proper positioning and to prevent falls while getting up, would also help prevent pressure ulcers

## 2023-03-30 NOTE — Assessment & Plan Note (Signed)
Likely due to chronic venous insufficiency Continue Lasix 20 mg BID Advised to stop amlodipine 5 mg for now Leg elevation and compression socks as tolerated

## 2023-03-30 NOTE — Assessment & Plan Note (Addendum)
Was deemed to be due to over supplementation of calcium, has stopped taking Os-Cal with D Hypercalcemia resolved with IV fluids, Zometa and calcitonin Check BMP  Addendum: Has recurrence of hypercalcemia. Discussed with Dr Talmage Nap. She also has elevated free light chains, concern for MM. Urgent referral to Oncology placed.

## 2023-03-30 NOTE — Patient Instructions (Addendum)
Please call insurance to ask about DME provider for hospital bed in the area and contact us with their name and fax number.  Please continue taking Levothyroxine 50 mcg once daily for now.  Please continue taking ferrous sulfate 325 mg once daily. You are being referred to Hematology for anemia.  Please stop taking Valacyclovir and Gabapentin.  Please continue to take other medications as prescribed.  Please get blood tests done in the next week.

## 2023-03-30 NOTE — Assessment & Plan Note (Addendum)
Noted on UPEP Has recurrent hypercalcemia, severe fatigue, loss of appetite Urgent referral to Oncology for evaluation of MM or bony metastasis

## 2023-03-30 NOTE — Progress Notes (Signed)
Established Patient Office Visit  Subjective:  Patient ID: Kathy [name], female    DOB: 01-02-1941  Age: 82 y.o. MRN: [id number]  CC:  Chief Complaint  Patient presents with   Hospitalization Follow-up    Hospital follow up     HPI Kathy [name] is a 82 y.o. female with past medical history of HTN, RA, hypothyroidism, osteoporosis, HLD, anxiety and polyarthritis who presents for follow-up after recent hospitalization from 03/11/23-03/17/23 and then to [facility] from 03/17/23-03/25/23.  She was admitted for symptomatic hypercalcemia that appeared to be related to excessive intake of calcium supplementation at home.  Hypercalcemia had resolved with IV fluids, Zometa and Calcotonin. She was taking calcium 1500 mg in a day in the past, but had decreased to 500 mg once a day since her last hospitalization in 07/24. She still had hypercalcemia leading to another hospitalization. She has stopped calcium supplement now. Her UPEP showed M spike and free kappa/lambda chain ratio was 899.87. I had discussion with Dr Talmage Nap about her current findings and urgent referral to Oncology has been placed.  She still feels fatigued.  She has difficulty getting up from her bed.  Denies any fever, chills, dyspnea. She had a fall at home before hospitalization, and hit her head with the side of the bed. CT head was negative for any acute bleeding. MRI brain was done to rule out metastatic mass, but was benign.  Her Hb was found to be 6.6 during her hospitalization and was given 1 U PRBC. Her Hb was 9.1 on discharge.  She reports chronic leg swelling, which is worse recently.  She takes Lasix 20 mg twice daily currently.  Of note, her amlodipine has been discontinued due to hypotension. Her BP is WNL today.  Her TSH was very low and her dose of levothyroxine was decreased to 50 mcg daily from 88 mcg QD.  She is going to see her endocrinologist - Dr. Talmage Nap in the next week.     Past Medical History:   Diagnosis Date   Abnormal findings on diagnostic imaging of heart and coronary circulation    Anemia    FROM BLEEDING ULCER   Anxiety    takes Alprazolam daily as needed   Arthritis    dx with RA 2017   Bariatric surgery status    Cataract    Diverticulosis    Gastro-esophageal reflux disease with esophagitis    Gastrojejunal ulcer with hemorrhage    Headache(784.0)    occasionally   History of blood transfusion    no abnormal reaction noted   History of bronchitis    last time many yrs ago   Hyperlipidemia    PT DENIES THIS DX -  ON NO MEDS AND NO ONE HAS TOLD HER   Hypertension    takes Amlodipine daily   Hypothyroidism    takes Synthroid daily   Insomnia    Joint pain    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   Localized edema    Nocturia    Numbness    occasionally left arm at night   Obesity    Osteoporosis    takes Fosamax weekly   Pain in joint involving pelvic region and thigh    Peripheral edema    takes Lasix daily as needed   PONV (postoperative nausea and vomiting)    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/2017   Rheumatoid arthritis, unspecified (HCC)    Rotator cuff arthropathy, 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  Sleep apnea    Phreesia 08/22/2020   Stomach ulcer    Thyroid disease    Phreesia 08/22/2020   Unspecified injury of muscle(s) and tendon(s) of the rotator cuff of left shoulder, subsequent encounter    Unspecified osteoarthritis, unspecified site    Wears glasses    Wears partial dentures     Past Surgical History:  Procedure Laterality Date   ABDOMINAL HYSTERECTOMY     partial   cataract surgery Bilateral    CHOLECYSTECTOMY     COLONOSCOPY     ESOPHAGOGASTRODUODENOSCOPY (EGD) WITH PROPOFOL N/A 01/11/2022   Procedure: ESOPHAGOGASTRODUODENOSCOPY (EGD) WITH PROPOFOL;  Surgeon: Iva Boop, MD;  Location: [facility] ENDOSCOPY;  Service: Gastroenterology;  Laterality: N/A;   EYE SURGERY     CATARACTS BOTH   gastric bypass surgery      HOT HEMOSTASIS N/A 01/11/2022   Procedure: HOT HEMOSTASIS (ARGON PLASMA COAGULATION/BICAP);  Surgeon: Iva Boop, MD;  Location: [facility] ENDOSCOPY;  Service: Gastroenterology;  Laterality: N/A;   JOINT REPLACEMENT Bilateral    hip   REVERSE SHOULDER ARTHROPLASTY Right 05/11/2016   Procedure: REVERSE SHOULDER ARTHROPLASTY;  Surgeon: Teryl Lucy, MD;  Location: [facility] OR;  Service: Orthopedics;  Laterality: Right;   REVISION TOTAL HIP ARTHROPLASTY Left 10/03/2013   DR Turner Daniels   ROTATOR CUFF REPAIR     SCLEROTHERAPY  01/11/2022   Procedure: Susa Day;  Surgeon: Iva Boop, MD;  Location: [facility] ENDOSCOPY;  Service: Gastroenterology;;   TOTAL HIP REVISION Left 10/03/2013   Procedure: TOTAL HIP REVISION- left;  Surgeon: Nestor Lewandowsky, MD;  Location: [facility] OR;  Service: Orthopedics;  Laterality: Left;   TOTAL KNEE ARTHROPLASTY Left 11/29/2017   Procedure: LEFT TOTAL KNEE ARTHROPLASTY;  Surgeon: Teryl Lucy, MD;  Location: [facility] OR;  Service: Orthopedics;  Laterality: Left;   TOTAL SHOULDER ARTHROPLASTY Left 11/09/2016   Procedure: TOTAL REVERSE SHOULDER ARTHROPLASTY;  Surgeon: Teryl Lucy, MD;  Location: [facility] OR;  Service: Orthopedics;  Laterality: Left;   TOTAL SHOULDER REPLACEMENT Left 10/2016   TOTAL THYROIDECTOMY     UPPER GASTROINTESTINAL ENDOSCOPY      Family History  Problem Relation Age of Onset   Diabetes Mother    Congestive Heart Failure Mother    Lung cancer Father    Thrombosis Sister    CAD Brother        CABG   Colon cancer Neg Hx    Esophageal cancer Neg Hx    Stomach cancer Neg Hx    Rectal cancer Neg Hx     Social History   Socioeconomic History   Marital status: Divorced    Spouse name: Not on file   Number of children: Not on file   Years of education: Not on file   Highest education level: Not on file  Occupational History   Occupation: disabled  Tobacco Use   Smoking status: Never    Passive exposure: Never   Smokeless tobacco: Never  Vaping Use    Vaping status: Never Used  Substance and Sexual Activity   Alcohol use: No   Drug use: No   Sexual activity: Not Currently    Birth control/protection: Surgical  Other Topics Concern   Not on file  Social History Narrative   Not on file   Social Determinants of Health   Financial Resource Strain: Low Risk  (09/14/2021)   Overall Financial Resource Strain (CARDIA)    Difficulty of Paying Living Expenses: Not hard at all  Food Insecurity: No Food Insecurity (03/11/2023)  Hunger Vital Sign    Worried About Running Out of Food in the Last Year: Never true    Ran Out of Food in the Last Year: Never true  Transportation Needs: No Transportation Needs (03/11/2023)   PRAPARE - Administrator, Civil Service (Medical): No    Lack of Transportation (Non-Medical): No  Physical Activity: Sufficiently Active (09/14/2021)   Exercise Vital Sign    Days of Exercise per Week: 6 days    Minutes of Exercise per Session: 40 min  Stress: No Stress Concern Present (09/14/2021)   [employer] of Occupational Health - Occupational Stress Questionnaire    Feeling of Stress : Not at all  Social Connections: Moderately Isolated (09/14/2021)   Social Connection and Isolation Panel [NHANES]    Frequency of Communication with Friends and Family: More than three times a week    Frequency of Social Gatherings with Friends and Family: More than three times a week    Attends Religious Services: More than 4 times per year    Active Member of [employer] or Organizations: No    Attends Banker Meetings: Never    Marital Status: Divorced  Catering manager Violence: Not At Risk (03/11/2023)   Humiliation, Afraid, Rape, and Kick questionnaire    Fear of Current or Ex-Partner: No    Emotionally Abused: No    Physically Abused: No    Sexually Abused: No    Outpatient Medications Prior to Visit  Medication Sig Dispense Refill   acetaminophen (TYLENOL) 500 MG tablet Take 1 tablet (500 mg total) by  mouth every 6 (six) hours as needed for mild pain, headache or fever (or Fever >/= 101). 30 tablet 0   Adalimumab (HUMIRA PEN) 40 MG/0.4ML PNKT Inject 40 mg into the skin every Saturday.     ALPRAZolam (XANAX) 0.25 MG tablet Take 1 tablet (0.25 mg total) by mouth 2 (two) times daily as needed for anxiety or sleep. TAKE 1 TABLET BY MOUTH TWICE DAILY AS NEEDED FOR ANXIETY OR SLEEP Strength: 0.25 mg 12 tablet 0   denosumab (PROLIA) 60 MG/ML SOSY injection inject 60mg  Subcutaneous every 6 months 180 days 1 mL 1   diclofenac Sodium (VOLTAREN) 1 % GEL APPLY (1) GRAM TO AFFECTED AREA DAILY. (Patient taking differently: Apply 1 g topically daily.) 100 g 0   ferrous sulfate 325 (65 FE) MG EC tablet Take 1 tablet (325 mg total) by mouth daily. 60 tablet 3   furosemide (LASIX) 20 MG tablet Take 1 tablet (20 mg total) by mouth 2 (two) times daily. (Patient taking differently: Take 20 mg by mouth daily as needed for fluid.) 180 tablet 3   levothyroxine (SYNTHROID) 50 MCG tablet Take 1 tablet (50 mcg total) by mouth daily. 30 tablet 1   Multiple Vitamin (MULTIVITAMIN WITH MINERALS) TABS tablet Take 1 tablet by mouth every morning.     naloxone (NARCAN) nasal spray 4 mg/0.1 mL Place 1 spray into the nose as needed (opioid reversal).     Omega-3 Fatty Acids (FISH OIL PO) Take 1 capsule by mouth daily.     omeprazole (PRILOSEC) 40 MG capsule Take 1 capsule (40 mg total) by mouth daily before breakfast. Open capsule and swallow granules with liquid or applesauce 90 capsule 3   oxybutynin (DITROPAN XL) 10 MG 24 hr tablet Take 1 tablet (10 mg total) by mouth at bedtime. 30 tablet 1   oxyCODONE-acetaminophen (PERCOCET/ROXICET) 5-325 MG tablet Take 1 tablet by mouth every 8 (eight) hours  as needed for moderate pain or severe pain (pain not relieved by tramadol). 10 tablet 0   potassium chloride SA (KLOR-CON M) 20 MEQ tablet Take 1 tablet (20 mEq total) by mouth daily. TAKE 1 TABLET (20 MEQ) BY MOUTH DAILY PRN with Lasix  (EDEMA) 90 tablet 3   traMADol (ULTRAM) 50 MG tablet Take 1 tablet (50 mg total) by mouth every 12 (twelve) hours as needed for moderate pain. 10 tablet 0   calcium-vitamin D (OSCAL WITH D) 500-5 MG-MCG tablet Take 1 tablet by mouth daily with breakfast. 30 tablet 0   No facility-administered medications prior to visit.    Allergies  Allergen Reactions   Macrobid [Nitrofurantoin] Other (See Comments)    Stomach pain    ROS Review of Systems  Constitutional:  Positive for fatigue. Negative for chills and fever.  HENT:  Negative for congestion, sinus pressure, sinus pain and sore throat.   Eyes:  Negative for pain and discharge.  Respiratory:  Negative for cough and shortness of breath.   Cardiovascular:  Negative for chest pain and palpitations.  Gastrointestinal:  Negative for abdominal pain, diarrhea, nausea and vomiting.  Endocrine: Negative for polydipsia and polyuria.  Genitourinary:  Negative for dysuria and hematuria.  Musculoskeletal:  Positive for arthralgias and back pain. Negative for neck pain and neck stiffness.  Skin:  Negative for rash.  Neurological:  Positive for weakness. Negative for dizziness and tremors.  Psychiatric/Behavioral:  Negative for agitation and behavioral problems.       Objective:    Physical Exam Vitals reviewed.  Constitutional:      General: She is not in acute distress.    Appearance: She is not diaphoretic.  HENT:     Head: Normocephalic and atraumatic.     Nose: Nose normal.     Mouth/Throat:     Mouth: Mucous membranes are moist.  Eyes:     General: No scleral icterus.    Extraocular Movements: Extraocular movements intact.     Pupils: Pupils are equal, round, and reactive to light.  Cardiovascular:     Rate and Rhythm: Normal rate and regular rhythm.     Pulses: Normal pulses.     Heart sounds: Normal heart sounds. No murmur heard. Pulmonary:     Breath sounds: Normal breath sounds. No wheezing or rales.  Abdominal:      Palpations: Abdomen is soft.     Tenderness: There is no abdominal tenderness.  Musculoskeletal:        General: Tenderness (Over MCPs and ICPs over b/l UE) and deformity (Ulnar deviation of b/l hands) present.     Cervical back: Neck supple. No tenderness.     Right lower leg: Edema (1+) present.     Left lower leg: Edema (1+) present.  Skin:    General: Skin is warm.     Findings: No rash.  Neurological:     General: No focal deficit present.     Mental Status: She is alert and oriented to person, place, and time.     Sensory: No sensory deficit.     Motor: Weakness (B/l LE - 3/5) present.  Psychiatric:        Mood and Affect: Mood normal.        Behavior: Behavior normal.     BP 131/76 (BP Location: Right Arm, Patient Position: Standing, Cuff Size: Normal)   Pulse 86   Ht 5' 1.5" (1.562 m)   Wt 151 lb 6.4 oz (68.7 kg)  SpO2 95%   BMI 28.14 kg/m  Wt Readings from Last 3 Encounters:  03/30/23 151 lb 6.4 oz (68.7 kg)  03/11/23 145 lb 8.1 oz (66 kg)  02/21/23 152 lb 6.4 oz (69.1 kg)    Lab Results  Component Value Date   TSH 4.312 03/30/2023   Lab Results  Component Value Date   WBC 5.0 03/30/2023   HGB 9.1 (L) 03/30/2023   HCT 27.9 (L) 03/30/2023   MCV 100.7 (H) 03/30/2023   PLT 200 03/30/2023   Lab Results  Component Value Date   NA 135 03/30/2023   K 4.1 03/30/2023   CO2 23 03/30/2023   GLUCOSE 78 03/30/2023   BUN 32 (H) 03/30/2023   CREATININE 1.22 (H) 03/30/2023   BILITOT 0.4 03/12/2023   ALKPHOS 52 03/12/2023   AST 29 03/12/2023   ALT 19 03/12/2023   PROT 8.7 (H) 03/12/2023   ALBUMIN <1.5 (L) 03/15/2023   CALCIUM 10.6 (H) 03/30/2023   ANIONGAP 10 03/30/2023   EGFR 39 (L) 02/23/2023   Lab Results  Component Value Date   CHOL 134 03/16/2022   Lab Results  Component Value Date   HDL 57 03/16/2022   Lab Results  Component Value Date   LDLCALC 64 03/16/2022   Lab Results  Component Value Date   TRIG 59 03/16/2022   Lab Results   Component Value Date   CHOLHDL 2.4 03/16/2022   Lab Results  Component Value Date   HGBA1C 5.1 05/23/2017      Assessment & Plan:   Problem List Items Addressed This Visit       Cardiovascular and Mediastinum   Hypertension    BP Readings from Last 1 Encounters:  03/30/23 131/76   Well-controlled, DC Amlodipine as her BP is wnl without it Counseled for compliance with the medications Advised DASH diet and moderate exercise/walking, at least 150 mins/week        Endocrine   Hypothyroid    Lab Results  Component Value Date   TSH 0.089 (L) 03/12/2023   TSH low, no symptoms of overcorrection currently On Levothyroxine 50 mcg QD, decreased from 88 mcg due to oversupplemented thyroid Repeat TSH and free T4 Followed by Dr. Talmage Nap      Relevant Orders   TSH + free T4     Other   Hypercalcemia - Primary    Was deemed to be due to over supplementation of calcium, has stopped taking Os-Cal with D Hypercalcemia resolved with IV fluids, Zometa and calcitonin Check BMP  Addendum: Has recurrence of hypercalcemia. Discussed with Dr Talmage Nap. She also has elevated free light chains, concern for MM. Urgent referral to Oncology placed.      Relevant Orders   Basic Metabolic Panel (BMET)   Leg swelling    Likely due to chronic venous insufficiency Continue Lasix 20 mg BID Advised to stop amlodipine 5 mg for now Leg elevation and compression socks as tolerated      Hospital discharge follow-up    Hospital chart reviewed, including discharge summary Medications reconciled and reviewed with the patient in detail      Microcytic anemia    Had chronic blood loss anemia, s/p 1 U PRBC during recent hospitalization Has had bleeding gastrojejunal ulcer in the past, now resolved Continue iron supplement Referred to Hematology Check CBC      Relevant Orders   CBC with Differential/Platelet   Ambulatory referral to Hematology / Oncology   Physical deconditioning    She has  acute on chronic generalized weakness from recent episodes of hypercalcemia, microcytic anemia and fluctuant hypothyroidism She has RA, which also limits her mobility She has difficulty getting up from her bed, has had fall recently - needs hospital bed for proper positioning and to prevent falls while getting up, would also help prevent pressure ulcers      Elevated serum immunoglobulin free light chain level    Noted on UPEP Has recurrent hypercalcemia, severe fatigue, loss of appetite Urgent referral to Oncology for evaluation of MM or bony metastasis      Relevant Orders   Ambulatory referral to Hematology / Oncology     No orders of the defined types were placed in this encounter.   Follow-up: Return in about 2 months (around 05/30/2023) for Hypercalcemia and HTN.    [name], MD

## 2023-03-30 NOTE — Assessment & Plan Note (Signed)
Had chronic blood loss anemia, s/p 1 U PRBC during recent hospitalization Has had bleeding gastrojejunal ulcer in the past, now resolved Continue iron supplement Referred to Hematology Check CBC

## 2023-03-30 NOTE — Assessment & Plan Note (Signed)
Hospital chart reviewed, including discharge summary Medications reconciled and reviewed with the patient in detail 

## 2023-03-31 DIAGNOSIS — G8929 Other chronic pain: Secondary | ICD-10-CM | POA: Diagnosis not present

## 2023-03-31 DIAGNOSIS — I480 Paroxysmal atrial fibrillation: Secondary | ICD-10-CM | POA: Diagnosis not present

## 2023-03-31 DIAGNOSIS — Z9181 History of falling: Secondary | ICD-10-CM | POA: Diagnosis not present

## 2023-03-31 DIAGNOSIS — G4700 Insomnia, unspecified: Secondary | ICD-10-CM | POA: Diagnosis not present

## 2023-03-31 DIAGNOSIS — Z96612 Presence of left artificial shoulder joint: Secondary | ICD-10-CM | POA: Diagnosis not present

## 2023-03-31 DIAGNOSIS — K219 Gastro-esophageal reflux disease without esophagitis: Secondary | ICD-10-CM | POA: Diagnosis not present

## 2023-03-31 DIAGNOSIS — M069 Rheumatoid arthritis, unspecified: Secondary | ICD-10-CM | POA: Diagnosis not present

## 2023-03-31 DIAGNOSIS — M6281 Muscle weakness (generalized): Secondary | ICD-10-CM | POA: Diagnosis not present

## 2023-03-31 DIAGNOSIS — Z96642 Presence of left artificial hip joint: Secondary | ICD-10-CM | POA: Diagnosis not present

## 2023-03-31 DIAGNOSIS — Z96611 Presence of right artificial shoulder joint: Secondary | ICD-10-CM | POA: Diagnosis not present

## 2023-03-31 DIAGNOSIS — M810 Age-related osteoporosis without current pathological fracture: Secondary | ICD-10-CM | POA: Diagnosis not present

## 2023-03-31 DIAGNOSIS — D631 Anemia in chronic kidney disease: Secondary | ICD-10-CM | POA: Diagnosis not present

## 2023-03-31 DIAGNOSIS — E039 Hypothyroidism, unspecified: Secondary | ICD-10-CM | POA: Diagnosis not present

## 2023-03-31 DIAGNOSIS — I129 Hypertensive chronic kidney disease with stage 1 through stage 4 chronic kidney disease, or unspecified chronic kidney disease: Secondary | ICD-10-CM | POA: Diagnosis not present

## 2023-03-31 DIAGNOSIS — Z85850 Personal history of malignant neoplasm of thyroid: Secondary | ICD-10-CM | POA: Diagnosis not present

## 2023-03-31 DIAGNOSIS — N182 Chronic kidney disease, stage 2 (mild): Secondary | ICD-10-CM | POA: Diagnosis not present

## 2023-03-31 DIAGNOSIS — Z96652 Presence of left artificial knee joint: Secondary | ICD-10-CM | POA: Diagnosis not present

## 2023-03-31 DIAGNOSIS — I4510 Unspecified right bundle-branch block: Secondary | ICD-10-CM | POA: Diagnosis not present

## 2023-03-31 DIAGNOSIS — E876 Hypokalemia: Secondary | ICD-10-CM | POA: Diagnosis not present

## 2023-04-01 ENCOUNTER — Ambulatory Visit: Payer: Self-pay | Admitting: *Deleted

## 2023-04-01 ENCOUNTER — Other Ambulatory Visit: Payer: Self-pay

## 2023-04-01 ENCOUNTER — Telehealth: Payer: Self-pay | Admitting: Internal Medicine

## 2023-04-01 NOTE — Telephone Encounter (Signed)
Patient Kathy [name] friend of patient came by to give this information for patient to get a hospital bed.  Fax number [facility] [phone].   [facility] phone # [phone]  [facility] phone # [phone]  [facility] [phone]

## 2023-04-01 NOTE — Telephone Encounter (Signed)
Order sent to [facility]  

## 2023-04-01 NOTE — Patient Outreach (Signed)
Care Coordination   Follow Up Visit Note   05/06/2023 late entry for 03/26/23 Name: Kathy [name] MRN: [id number] DOB: May 03, 1941  Kathy [name] is a 82 y.o. year old female who sees Kathy Halon, MD for primary care. I spoke with  Kathy [name] by phone today.  What matters to the patients health and wellness today?  Doing okay  Home with home health services ([facility])  Home  improvements Discussed a ramp, new flooring Anemia discussed     Goals Addressed             This Visit's Progress    Home safety management (ramp,bulking flooring)       Interventions Today    Flowsheet Row Most Recent Value  Chronic Disease   Chronic disease during today's visit Other  [anemia home improvement anemia]  General Interventions   General Interventions Discussed/Reviewed General Interventions Reviewed, Durable Medical Equipment (DME), Doctor Visits  Doctor Visits Discussed/Reviewed Doctor Visits Reviewed, PCP  Durable Medical Equipment (DME) Other  [ramp]  Mental Health Interventions   Mental Health Discussed/Reviewed Mental Health Reviewed, Other  [orientation check able to remember day of week, month, year but struggled with name of president No Neurology]  Pharmacy Interventions   Pharmacy Dicussed/Reviewed Pharmacy Topics Reviewed, Medications and their functions  [discussed medication contraindications]  Safety Interventions   Safety Discussed/Reviewed Safety Reviewed, Fall Risk, Home Safety  Home Safety Assistive Devices  [confirmed she has her neighbor who checks on her]              SDOH assessments and interventions completed:  No     Care Coordination Interventions:  Yes, provided   Follow up plan: Follow up call scheduled for 04/15/23    Encounter Outcome:  Patient Visit Completed   Kathy Bradford L. Noelle Penner, RN, BSN, CCM  Care Management Coordinator  [phone]  Fax: [phone]

## 2023-04-01 NOTE — Patient Outreach (Addendum)
  Care Coordination   Follow Up Visit Note   04/12/2023 Name: Myani Busam Fedor MRN: [id number] DOB: Oct 11, 1940  Malonie Degree Alwine is a 82 y.o. year old female who sees [name], MD for primary care. I spoke with  Harlin Rain Thaden by phone today.  What matters to the patients health and wellness today?  Fall- hospitalization slid off bed and hit head    Now have change in memory Orientation done - able to remember day of week month, year but struggled with president  Has not been followed by a neurologist   Hypercalcemia off of it now Asked question about medication contraindicated for        Goals Addressed             This Visit's Progress    Home safety management (ramp,bulking flooring)       Interventions Today    Flowsheet Row Most Recent Value  Chronic Disease   Chronic disease during today's visit Other, Diabetes  [Fall Memory changes, hypercalcemia,]  Mental Health Interventions   Mental Health Discussed/Reviewed Mental Health Reviewed, Other  [orientation check able to remember day of week, month, year but struggled with name of president No Neurology]  Pharmacy Interventions   Pharmacy Dicussed/Reviewed Pharmacy Topics Reviewed, Medications and their functions  [discussed medication contraindications]  Safety Interventions   Safety Discussed/Reviewed Safety Reviewed, Fall Risk, Home Safety  Home Safety Assistive Devices  [confirmed she has her neighbor who checks on her]              SDOH assessments and interventions completed:  No     Care Coordination Interventions:  Yes, provided   Follow up plan: Follow up call scheduled for 04/15/23 1100    Encounter Outcome:  Patient Visit Completed   Chidubem Chaires L. Noelle Penner, RN, BSN, CCM, Care Management Coordinator [phone]

## 2023-04-05 ENCOUNTER — Other Ambulatory Visit: Payer: Self-pay

## 2023-04-05 ENCOUNTER — Other Ambulatory Visit (HOSPITAL_COMMUNITY): Payer: Self-pay

## 2023-04-05 ENCOUNTER — Telehealth: Payer: Self-pay | Admitting: Internal Medicine

## 2023-04-05 MED ORDER — PROLIA 60 MG/ML ~~LOC~~ SOSY: PREFILLED_SYRINGE | SUBCUTANEOUS | 1 refills | Status: DC

## 2023-04-05 NOTE — Telephone Encounter (Signed)
Zacharia called from [facility] home health occupational therapy. Evaluation effective this week 09.01.2024. call back # [phone]

## 2023-04-05 NOTE — Telephone Encounter (Signed)
Left message

## 2023-04-06 ENCOUNTER — Telehealth: Payer: Self-pay | Admitting: Internal Medicine

## 2023-04-06 ENCOUNTER — Inpatient Hospital Stay (HOSPITAL_COMMUNITY)
Admission: EM | Admit: 2023-04-06 | Discharge: 2023-04-12 | DRG: 640 | Disposition: A | Discharge status: Skilled Nursing Facility | Payer: 59 | Attending: Internal Medicine | Admitting: Internal Medicine

## 2023-04-06 ENCOUNTER — Encounter (HOSPITAL_COMMUNITY): Payer: Self-pay

## 2023-04-06 ENCOUNTER — Emergency Department (HOSPITAL_COMMUNITY): Payer: 59

## 2023-04-06 ENCOUNTER — Other Ambulatory Visit: Payer: Self-pay

## 2023-04-06 ENCOUNTER — Other Ambulatory Visit (HOSPITAL_COMMUNITY): Payer: Self-pay

## 2023-04-06 DIAGNOSIS — K2100 Gastro-esophageal reflux disease with esophagitis, without bleeding: Secondary | ICD-10-CM | POA: Diagnosis present

## 2023-04-06 DIAGNOSIS — Z96643 Presence of artificial hip joint, bilateral: Secondary | ICD-10-CM | POA: Diagnosis present

## 2023-04-06 DIAGNOSIS — K859 Acute pancreatitis without necrosis or infection, unspecified: Secondary | ICD-10-CM | POA: Insufficient documentation

## 2023-04-06 DIAGNOSIS — R6889 Other general symptoms and signs: Secondary | ICD-10-CM | POA: Diagnosis not present

## 2023-04-06 DIAGNOSIS — I10 Essential (primary) hypertension: Secondary | ICD-10-CM | POA: Diagnosis not present

## 2023-04-06 DIAGNOSIS — Z8507 Personal history of malignant neoplasm of pancreas: Secondary | ICD-10-CM

## 2023-04-06 DIAGNOSIS — R531 Weakness: Secondary | ICD-10-CM

## 2023-04-06 DIAGNOSIS — R296 Repeated falls: Secondary | ICD-10-CM

## 2023-04-06 DIAGNOSIS — N179 Acute kidney failure, unspecified: Secondary | ICD-10-CM | POA: Diagnosis not present

## 2023-04-06 DIAGNOSIS — E039 Hypothyroidism, unspecified: Secondary | ICD-10-CM | POA: Diagnosis not present

## 2023-04-06 DIAGNOSIS — Z1152 Encounter for screening for COVID-19: Secondary | ICD-10-CM

## 2023-04-06 DIAGNOSIS — Z833 Family history of diabetes mellitus: Secondary | ICD-10-CM | POA: Diagnosis not present

## 2023-04-06 DIAGNOSIS — I700 Atherosclerosis of aorta: Secondary | ICD-10-CM | POA: Diagnosis not present

## 2023-04-06 DIAGNOSIS — N1832 Chronic kidney disease, stage 3b: Secondary | ICD-10-CM | POA: Diagnosis not present

## 2023-04-06 DIAGNOSIS — E860 Dehydration: Secondary | ICD-10-CM | POA: Diagnosis present

## 2023-04-06 DIAGNOSIS — I6523 Occlusion and stenosis of bilateral carotid arteries: Secondary | ICD-10-CM | POA: Diagnosis not present

## 2023-04-06 DIAGNOSIS — K219 Gastro-esophageal reflux disease without esophagitis: Secondary | ICD-10-CM | POA: Diagnosis present

## 2023-04-06 DIAGNOSIS — K8500 Idiopathic acute pancreatitis without necrosis or infection: Secondary | ICD-10-CM | POA: Diagnosis present

## 2023-04-06 DIAGNOSIS — M069 Rheumatoid arthritis, unspecified: Secondary | ICD-10-CM | POA: Diagnosis not present

## 2023-04-06 DIAGNOSIS — M810 Age-related osteoporosis without current pathological fracture: Secondary | ICD-10-CM | POA: Diagnosis present

## 2023-04-06 DIAGNOSIS — Z6828 Body mass index (BMI) 28.0-28.9, adult: Secondary | ICD-10-CM

## 2023-04-06 DIAGNOSIS — C9000 Multiple myeloma not having achieved remission: Secondary | ICD-10-CM | POA: Diagnosis present

## 2023-04-06 DIAGNOSIS — D638 Anemia in other chronic diseases classified elsewhere: Secondary | ICD-10-CM | POA: Diagnosis present

## 2023-04-06 DIAGNOSIS — E8809 Other disorders of plasma-protein metabolism, not elsewhere classified: Secondary | ICD-10-CM | POA: Diagnosis not present

## 2023-04-06 DIAGNOSIS — Z96612 Presence of left artificial shoulder joint: Secondary | ICD-10-CM | POA: Diagnosis present

## 2023-04-06 DIAGNOSIS — I129 Hypertensive chronic kidney disease with stage 1 through stage 4 chronic kidney disease, or unspecified chronic kidney disease: Secondary | ICD-10-CM | POA: Diagnosis not present

## 2023-04-06 DIAGNOSIS — Y929 Unspecified place or not applicable: Secondary | ICD-10-CM

## 2023-04-06 DIAGNOSIS — Z9181 History of falling: Secondary | ICD-10-CM

## 2023-04-06 DIAGNOSIS — Z8249 Family history of ischemic heart disease and other diseases of the circulatory system: Secondary | ICD-10-CM | POA: Diagnosis not present

## 2023-04-06 DIAGNOSIS — Z96611 Presence of right artificial shoulder joint: Secondary | ICD-10-CM | POA: Diagnosis present

## 2023-04-06 DIAGNOSIS — Z96642 Presence of left artificial hip joint: Secondary | ICD-10-CM | POA: Diagnosis present

## 2023-04-06 DIAGNOSIS — I959 Hypotension, unspecified: Secondary | ICD-10-CM | POA: Diagnosis not present

## 2023-04-06 DIAGNOSIS — Z888 Allergy status to other drugs, medicaments and biological substances status: Secondary | ICD-10-CM

## 2023-04-06 DIAGNOSIS — K8689 Other specified diseases of pancreas: Secondary | ICD-10-CM | POA: Diagnosis not present

## 2023-04-06 DIAGNOSIS — W19XXXA Unspecified fall, initial encounter: Secondary | ICD-10-CM | POA: Diagnosis not present

## 2023-04-06 DIAGNOSIS — Z043 Encounter for examination and observation following other accident: Secondary | ICD-10-CM | POA: Diagnosis not present

## 2023-04-06 DIAGNOSIS — M47816 Spondylosis without myelopathy or radiculopathy, lumbar region: Secondary | ICD-10-CM | POA: Diagnosis not present

## 2023-04-06 DIAGNOSIS — N200 Calculus of kidney: Secondary | ICD-10-CM | POA: Diagnosis present

## 2023-04-06 DIAGNOSIS — Z85850 Personal history of malignant neoplasm of thyroid: Secondary | ICD-10-CM | POA: Diagnosis not present

## 2023-04-06 DIAGNOSIS — F419 Anxiety disorder, unspecified: Secondary | ICD-10-CM | POA: Diagnosis present

## 2023-04-06 DIAGNOSIS — K5730 Diverticulosis of large intestine without perforation or abscess without bleeding: Secondary | ICD-10-CM | POA: Diagnosis not present

## 2023-04-06 DIAGNOSIS — K760 Fatty (change of) liver, not elsewhere classified: Secondary | ICD-10-CM | POA: Diagnosis present

## 2023-04-06 DIAGNOSIS — Z7983 Long term (current) use of bisphosphonates: Secondary | ICD-10-CM

## 2023-04-06 DIAGNOSIS — R571 Hypovolemic shock: Secondary | ICD-10-CM | POA: Diagnosis not present

## 2023-04-06 DIAGNOSIS — Z751 Person awaiting admission to adequate facility elsewhere: Secondary | ICD-10-CM

## 2023-04-06 DIAGNOSIS — R1310 Dysphagia, unspecified: Secondary | ICD-10-CM | POA: Diagnosis not present

## 2023-04-06 DIAGNOSIS — E785 Hyperlipidemia, unspecified: Secondary | ICD-10-CM | POA: Diagnosis present

## 2023-04-06 DIAGNOSIS — M545 Low back pain, unspecified: Secondary | ICD-10-CM | POA: Diagnosis not present

## 2023-04-06 DIAGNOSIS — Z9842 Cataract extraction status, left eye: Secondary | ICD-10-CM

## 2023-04-06 DIAGNOSIS — E46 Unspecified protein-calorie malnutrition: Secondary | ICD-10-CM | POA: Diagnosis not present

## 2023-04-06 DIAGNOSIS — R5381 Other malaise: Secondary | ICD-10-CM | POA: Diagnosis not present

## 2023-04-06 DIAGNOSIS — D539 Nutritional anemia, unspecified: Secondary | ICD-10-CM | POA: Diagnosis not present

## 2023-04-06 DIAGNOSIS — Z808 Family history of malignant neoplasm of other organs or systems: Secondary | ICD-10-CM

## 2023-04-06 DIAGNOSIS — E43 Unspecified severe protein-calorie malnutrition: Secondary | ICD-10-CM | POA: Diagnosis present

## 2023-04-06 DIAGNOSIS — Z96652 Presence of left artificial knee joint: Secondary | ICD-10-CM | POA: Diagnosis present

## 2023-04-06 DIAGNOSIS — Z801 Family history of malignant neoplasm of trachea, bronchus and lung: Secondary | ICD-10-CM

## 2023-04-06 DIAGNOSIS — M5136 Other intervertebral disc degeneration, lumbar region: Secondary | ICD-10-CM | POA: Diagnosis not present

## 2023-04-06 DIAGNOSIS — E871 Hypo-osmolality and hyponatremia: Secondary | ICD-10-CM | POA: Diagnosis not present

## 2023-04-06 DIAGNOSIS — G4700 Insomnia, unspecified: Secondary | ICD-10-CM | POA: Diagnosis present

## 2023-04-06 DIAGNOSIS — Z9841 Cataract extraction status, right eye: Secondary | ICD-10-CM

## 2023-04-06 DIAGNOSIS — Z743 Need for continuous supervision: Secondary | ICD-10-CM | POA: Diagnosis not present

## 2023-04-06 DIAGNOSIS — Z79890 Hormone replacement therapy (postmenopausal): Secondary | ICD-10-CM

## 2023-04-06 DIAGNOSIS — W1830XA Fall on same level, unspecified, initial encounter: Secondary | ICD-10-CM | POA: Diagnosis present

## 2023-04-06 DIAGNOSIS — Z79899 Other long term (current) drug therapy: Secondary | ICD-10-CM

## 2023-04-06 DIAGNOSIS — K8501 Idiopathic acute pancreatitis with uninfected necrosis: Secondary | ICD-10-CM | POA: Diagnosis not present

## 2023-04-06 LAB — CBC WITH DIFFERENTIAL/PLATELET
Abs Immature Granulocytes: 0.03 10*3/uL (ref 0.00–0.07)
Basophils Absolute: 0 10*3/uL (ref 0.0–0.1)
Basophils Relative: 0 %
Eosinophils Absolute: 0 10*3/uL (ref 0.0–0.5)
Eosinophils Relative: 0 %
HCT: 28.5 % — ABNORMAL LOW (ref 36.0–46.0)
Hemoglobin: 9.2 g/dL — ABNORMAL LOW (ref 12.0–15.0)
Immature Granulocytes: 1 %
Lymphocytes Relative: 21 %
Lymphs Abs: 1.3 10*3/uL (ref 0.7–4.0)
MCH: 33.1 pg (ref 26.0–34.0)
MCHC: 32.3 g/dL (ref 30.0–36.0)
MCV: 102.5 fL — ABNORMAL HIGH (ref 80.0–100.0)
Monocytes Absolute: 0.8 10*3/uL (ref 0.1–1.0)
Monocytes Relative: 12 %
Neutro Abs: 4.3 10*3/uL (ref 1.7–7.7)
Neutrophils Relative %: 66 %
Platelets: 190 10*3/uL (ref 150–400)
RBC: 2.78 MIL/uL — ABNORMAL LOW (ref 3.87–5.11)
RDW: 18.9 % — ABNORMAL HIGH (ref 11.5–15.5)
WBC: 6.5 10*3/uL (ref 4.0–10.5)
nRBC: 0.9 % — ABNORMAL HIGH (ref 0.0–0.2)

## 2023-04-06 LAB — PROTIME-INR
INR: 1.6 — ABNORMAL HIGH (ref 0.8–1.2)
Prothrombin Time: 19.2 s — ABNORMAL HIGH (ref 11.4–15.2)

## 2023-04-06 LAB — TYPE AND SCREEN
ABO/RH(D): AB POS
Antibody Screen: NEGATIVE

## 2023-04-06 LAB — SARS CORONAVIRUS 2 BY RT PCR: SARS Coronavirus 2 by RT PCR: NEGATIVE

## 2023-04-06 LAB — TROPONIN I (HIGH SENSITIVITY)
Troponin I (High Sensitivity): 416 ng/L (ref <18)
Troponin I (High Sensitivity): 315 ng/L (ref <18)

## 2023-04-06 LAB — COMPREHENSIVE METABOLIC PANEL
ALT: 28 U/L (ref 0–44)
AST: 77 U/L — ABNORMAL HIGH (ref 15–41)
Albumin: <1.5 g/dL — ABNORMAL LOW (ref 3.5–5.0)
Alkaline Phosphatase: 60 U/L (ref 38–126)
Anion gap: 9 (ref 5–15)
BUN: 26 mg/dL — ABNORMAL HIGH (ref 8–23)
CO2: 20 mmol/L — ABNORMAL LOW (ref 22–32)
Calcium: 12.4 mg/dL — ABNORMAL HIGH (ref 8.9–10.3)
Chloride: 107 mmol/L (ref 98–111)
Creatinine, Ser: 1.27 mg/dL — ABNORMAL HIGH (ref 0.44–1.00)
GFR, Estimated: 43 mL/min — ABNORMAL LOW (ref >60)
Glucose, Bld: 77 mg/dL (ref 70–99)
Potassium: 4.3 mmol/L (ref 3.5–5.1)
Sodium: 136 mmol/L (ref 135–145)
Total Bilirubin: 0.5 mg/dL (ref 0.3–1.2)
Total Protein: 9.2 g/dL — ABNORMAL HIGH (ref 6.5–8.1)

## 2023-04-06 LAB — URINALYSIS, ROUTINE W REFLEX MICROSCOPIC
Bacteria, UA: NONE SEEN
Bilirubin Urine: NEGATIVE
Glucose, UA: NEGATIVE mg/dL
Hgb urine dipstick: NEGATIVE
Ketones, ur: NEGATIVE mg/dL
Nitrite: NEGATIVE
Protein, ur: 30 mg/dL — AB
Specific Gravity, Urine: 1.017 (ref 1.005–1.030)
pH: 6 (ref 5.0–8.0)

## 2023-04-06 LAB — LACTIC ACID, PLASMA
Lactic Acid, Venous: 2.2 mmol/L (ref 0.5–1.9)
Lactic Acid, Venous: 2 mmol/L (ref 0.5–1.9)

## 2023-04-06 LAB — LIPASE, BLOOD: Lipase: 306 U/L — ABNORMAL HIGH (ref 11–51)

## 2023-04-06 MED ORDER — IOHEXOL 300 MG/ML  SOLN
100.0000 mL | Freq: Once | INTRAMUSCULAR | Status: AC | PRN
  Administered 2023-04-06: 100 mL via INTRAVENOUS

## 2023-04-06 MED ORDER — SODIUM CHLORIDE 0.9 % IV BOLUS
500.0000 mL | Freq: Once | INTRAVENOUS | Status: AC
  Administered 2023-04-06: 500 mL via INTRAVENOUS

## 2023-04-06 MED ORDER — PROLIA 60 MG/ML ~~LOC~~ SOSY: PREFILLED_SYRINGE | SUBCUTANEOUS | 1 refills | Status: DC

## 2023-04-06 MED ORDER — SODIUM CHLORIDE 0.9 % IV BOLUS
1000.0000 mL | Freq: Once | INTRAVENOUS | Status: AC
  Administered 2023-04-06: 1000 mL via INTRAVENOUS

## 2023-04-06 MED ORDER — OXYCODONE-ACETAMINOPHEN 5-325 MG PO TABS
1.0000 | ORAL_TABLET | Freq: Once | ORAL | Status: AC
  Administered 2023-04-06: 1 via ORAL
  Filled 2023-04-06: qty 1

## 2023-04-06 NOTE — ED Notes (Signed)
Patient transported to CT 

## 2023-04-06 NOTE — ED Notes (Signed)
Assisted to bedpan. Urine collected.

## 2023-04-06 NOTE — ED Notes (Signed)
Lactic obtained and sent to lab. Nurse notified.

## 2023-04-06 NOTE — ED Triage Notes (Signed)
Pt bib [facility] from home, for recurrent falls and generalized weakness- pt was hospitalized and placed in rehab for this same issue and discharged back home 2 weeks- per EMS pt may possibly have pancreatic CA but has not had biopsy yet. Pt daughter was at house when EMS arrived, but unknown whether daughter lives with pt. Pt alert and oriented x 4. Pt able to stand and uses walker to get around, although hasn't been walking today, feels to weak but able to stand with assistance.

## 2023-04-06 NOTE — Progress Notes (Incomplete)
Kathy [name] [address] [place] [address]   Clinic [name]:  04/06/2023  Referring physician: [name], MD  Patient Care Team: Kathy Halon, MD as PCP - General (Internal Medicine) Kathy [name] Kathy Pea, MD as PCP - Cardiology (Cardiology) Kathy Gallant, RN as [facility] Care Management Kathy Frames, MD as Referring Physician (Endocrinology)   ASSESSMENT & PLAN:   Assessment: ***  Plan: ***  No orders of the defined types were placed in this encounter.     Kathy [name],acting as a Neurosurgeon for Kathy Massed, MD.,have documented all relevant documentation on the behalf of Kathy Massed, MD,as directed by  Kathy Massed, MD while in the presence of Kathy Massed, MD.   ***  Kathy [name]   9/4/20249:30 PM  CHIEF COMPLAINT/PURPOSE OF CONSULT:   Diagnosis: ***  Cancer Staging  No matching staging information was found for the patient.    Prior Therapy: ***  Current Therapy:  ***   HISTORY OF PRESENT ILLNESS:   Oncology History   No history exists.      Kathy [name] is a 82 y.o. female presenting to clinic today for evaluation of elevated serum Ig free light chain level for possible MM and microcytic anemia at the request of Kathy Halon, MD.  She was seen in the ED from 03/11/23-03/17/23 and then to [facility] from 03/17/23-03/25/23 for symptomatic hypercalcemia related to excessive intake of calcium supplementation at home. She has stopped calcium supplement after discharge. She was seen again in the ED yesterday for a fall.   Her UPEP  from 03/12/23 showed M spike at 69.4, free kappa/lambda chain ratio elevated at 899.87, and elevated free kappa light chains at 1,799.73. Protein electrophoresis panel on 03/12/23 showed M spike at 4.9, Gamma globulin low at 0.1, Beta Globulin elevated at 5.6, albumin low at 2.5, and total protein elevated at 9.0.  Her most recent CBC from yesterday was  abnormal with low RBC at 2.78, low HGB at 9.2, low HCT at 28.5, elevated MCV at 102.5, elevated RDW at 18.9, and elevated nRBC at 0.9.   Today, she states that she is doing well overall. Her appetite level is at ***%. Her energy level is at ***%.  PAST MEDICAL HISTORY:   Past Medical History: Past Medical History:  Diagnosis Date   Abnormal findings on diagnostic imaging of heart and coronary circulation    Anemia    FROM BLEEDING ULCER   Anxiety    takes Alprazolam daily as needed   Arthritis    dx with RA 2017   Bariatric surgery status    Cataract    Diverticulosis    Gastro-esophageal reflux disease with esophagitis    Gastrojejunal ulcer with hemorrhage    Headache(784.0)    occasionally   History of blood transfusion    no abnormal reaction noted   History of bronchitis    last time many yrs ago   Hyperlipidemia    PT DENIES THIS DX -  ON NO MEDS AND NO ONE HAS TOLD HER   Hypertension    takes Amlodipine daily   Hypothyroidism    takes Synthroid daily   Insomnia    Joint pain    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   Localized edema    Nocturia    Numbness    occasionally left arm at night   Obesity    Osteoporosis    takes Fosamax weekly   Pain  in joint involving pelvic region and thigh    Peripheral edema    takes Lasix daily as needed   PONV (postoperative nausea and vomiting)    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/2017   Rheumatoid arthritis, unspecified (HCC)    Rotator cuff arthropathy, 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   Sleep apnea    Phreesia 08/22/2020   Stomach ulcer    Thyroid disease    Phreesia 08/22/2020   Unspecified injury of muscle(s) and tendon(s) of the rotator cuff of left shoulder, subsequent encounter    Unspecified osteoarthritis, unspecified site    Wears glasses    Wears partial dentures     Surgical History: Past Surgical History:  Procedure Laterality Date   ABDOMINAL HYSTERECTOMY     partial   cataract  surgery Bilateral    CHOLECYSTECTOMY     COLONOSCOPY     ESOPHAGOGASTRODUODENOSCOPY (EGD) WITH PROPOFOL N/A 01/11/2022   Procedure: ESOPHAGOGASTRODUODENOSCOPY (EGD) WITH PROPOFOL;  Surgeon: Kathy Boop, MD;  Location: [name] ENDOSCOPY;  Service: Gastroenterology;  Laterality: N/A;   EYE SURGERY     CATARACTS BOTH   gastric bypass surgery     HOT HEMOSTASIS N/A 01/11/2022   Procedure: HOT HEMOSTASIS (ARGON PLASMA COAGULATION/BICAP);  Surgeon: Kathy Boop, MD;  Location: [facility] ENDOSCOPY;  Service: Gastroenterology;  Laterality: N/A;   JOINT REPLACEMENT Bilateral    hip   REVERSE SHOULDER ARTHROPLASTY Right 05/11/2016   Procedure: REVERSE SHOULDER ARTHROPLASTY;  Surgeon: Kathy Lucy, MD;  Location: [facility] OR;  Service: Orthopedics;  Laterality: Right;   REVISION TOTAL HIP ARTHROPLASTY Left 10/03/2013   DR Kathy [name]   ROTATOR CUFF REPAIR     SCLEROTHERAPY  01/11/2022   Procedure: Kathy [name];  Surgeon: Kathy Boop, MD;  Location: [name] ENDOSCOPY;  Service: Gastroenterology;;   TOTAL HIP REVISION Left 10/03/2013   Procedure: TOTAL HIP REVISION- left;  Surgeon: Kathy Lewandowsky, MD;  Location: [facility] OR;  Service: Orthopedics;  Laterality: Left;   TOTAL KNEE ARTHROPLASTY Left 11/29/2017   Procedure: LEFT TOTAL KNEE ARTHROPLASTY;  Surgeon: Kathy Lucy, MD;  Location: [facility] OR;  Service: Orthopedics;  Laterality: Left;   TOTAL SHOULDER ARTHROPLASTY Left 11/09/2016   Procedure: TOTAL REVERSE SHOULDER ARTHROPLASTY;  Surgeon: Kathy Lucy, MD;  Location: [facility] OR;  Service: Orthopedics;  Laterality: Left;   TOTAL SHOULDER REPLACEMENT Left 10/2016   TOTAL THYROIDECTOMY     UPPER GASTROINTESTINAL ENDOSCOPY      Social History: Social History   Socioeconomic History   Marital status: Divorced    Spouse name: Not on file   Number of children: Not on file   Years of education: Not on file   Highest education level: Not on file  Occupational History   Occupation: disabled  Tobacco Use   Smoking status:  Never    Passive exposure: Never   Smokeless tobacco: Never  Vaping Use   Vaping status: Never Used  Substance and Sexual Activity   Alcohol use: No   Drug use: No   Sexual activity: Not Currently    Birth control/protection: Surgical  Other Topics Concern   Not on file  Social History Narrative   Not on file   Social Determinants of Health   Financial Resource Strain: Low Risk  (09/14/2021)   Overall Financial Resource Strain (CARDIA)    Difficulty of Paying Living Expenses: Not hard at all  Food Insecurity: No Food Insecurity (03/11/2023)   Hunger Vital Sign    Worried About Running Out of Food in the Last Year:  Never true    Ran Out of Food in the Last Year: Never true  Transportation Needs: No Transportation Needs (03/11/2023)   PRAPARE - Administrator, Civil Service (Medical): No    Lack of Transportation (Non-Medical): No  Physical Activity: Sufficiently Active (09/14/2021)   Exercise Vital Sign    Days of Exercise per Week: 6 days    Minutes of Exercise per Session: 40 min  Stress: No Stress Concern Present (09/14/2021)   [employer] of Occupational Health - Occupational Stress Questionnaire    Feeling of Stress : Not at all  Social Connections: Moderately Isolated (09/14/2021)   Social Connection and Isolation Panel [NHANES]    Frequency of Communication with Friends and Family: More than three times a week    Frequency of Social Gatherings with Friends and Family: More than three times a week    Attends Religious Services: More than 4 times per year    Active Member of [employer] or Organizations: No    Attends Banker Meetings: Never    Marital Status: Divorced  Catering manager Violence: Not At Risk (03/11/2023)   Humiliation, Afraid, Rape, and Kick questionnaire    Fear of Current or Ex-Partner: No    Emotionally Abused: No    Physically Abused: No    Sexually Abused: No    Family History: Family History  Problem Relation Age of Onset    Diabetes Mother    Congestive Heart Failure Mother    Lung cancer Father    Thrombosis Sister    CAD Brother        CABG   Colon cancer Neg Hx    Esophageal cancer Neg Hx    Stomach cancer Neg Hx    Rectal cancer Neg Hx     Current Medications:  Current Outpatient Medications:    acetaminophen (TYLENOL) 500 MG tablet, Take 1 tablet (500 mg total) by mouth every 6 (six) hours as needed for mild pain, headache or fever (or Fever >/= 101)., Disp: 30 tablet, Rfl: 0   Adalimumab (HUMIRA PEN) 40 MG/0.4ML PNKT, Inject 40 mg into the skin every Saturday., Disp: , Rfl:    ALPRAZolam (XANAX) 0.25 MG tablet, Take 1 tablet (0.25 mg total) by mouth 2 (two) times daily as needed for anxiety or sleep. TAKE 1 TABLET BY MOUTH TWICE DAILY AS NEEDED FOR ANXIETY OR SLEEP Strength: 0.25 mg, Disp: 12 tablet, Rfl: 0   denosumab (PROLIA) 60 MG/ML SOSY injection, inject 60mg  Subcutaneous every 6 months 180 days, Disp: 1 mL, Rfl: 1   denosumab (PROLIA) 60 MG/ML SOSY injection, inject 60mg  Subcutaneous every 6 months 180 days, Disp: 1 mL, Rfl: 1   denosumab (PROLIA) 60 MG/ML SOSY injection, 60mg  Subcutaneous every 6 months 180 days, Disp: 1 mL, Rfl: 1   diclofenac Sodium (VOLTAREN) 1 % GEL, APPLY (1) GRAM TO AFFECTED AREA DAILY. (Patient taking differently: Apply 1 g topically daily.), Disp: 100 g, Rfl: 0   ferrous sulfate 325 (65 FE) MG EC tablet, Take 1 tablet (325 mg total) by mouth daily., Disp: 60 tablet, Rfl: 3   furosemide (LASIX) 20 MG tablet, Take 1 tablet (20 mg total) by mouth 2 (two) times daily. (Patient taking differently: Take 20 mg by mouth daily as needed for fluid.), Disp: 180 tablet, Rfl: 3   levothyroxine (SYNTHROID) 50 MCG tablet, Take 1 tablet (50 mcg total) by mouth daily., Disp: 30 tablet, Rfl: 1   Multiple Vitamin (MULTIVITAMIN WITH MINERALS) TABS tablet, Take  1 tablet by mouth every morning., Disp: , Rfl:    naloxone (NARCAN) nasal spray 4 mg/0.1 mL, Place 1 spray into the nose as needed  (opioid reversal)., Disp: , Rfl:    Omega-3 Fatty Acids (FISH OIL PO), Take 1 capsule by mouth daily., Disp: , Rfl:    omeprazole (PRILOSEC) 40 MG capsule, Take 1 capsule (40 mg total) by mouth daily before breakfast. Open capsule and swallow granules with liquid or applesauce, Disp: 90 capsule, Rfl: 3   oxybutynin (DITROPAN XL) 10 MG 24 hr tablet, Take 1 tablet (10 mg total) by mouth at bedtime., Disp: 30 tablet, Rfl: 1   oxyCODONE-acetaminophen (PERCOCET/ROXICET) 5-325 MG tablet, Take 1 tablet by mouth every 8 (eight) hours as needed for moderate pain or severe pain (pain not relieved by tramadol)., Disp: 10 tablet, Rfl: 0   potassium chloride SA (KLOR-CON M) 20 MEQ tablet, Take 1 tablet (20 mEq total) by mouth daily. TAKE 1 TABLET (20 MEQ) BY MOUTH DAILY PRN with Lasix (EDEMA), Disp: 90 tablet, Rfl: 3   traMADol (ULTRAM) 50 MG tablet, Take 1 tablet (50 mg total) by mouth every 12 (twelve) hours as needed for moderate pain., Disp: 10 tablet, Rfl: 0   Allergies: Allergies  Allergen Reactions   Macrobid [Nitrofurantoin] Other (See Comments)    Stomach pain    REVIEW OF SYSTEMS:   Review of Systems  Constitutional:  Negative for chills, fatigue and fever.  HENT:   Negative for lump/mass, mouth sores, nosebleeds, sore throat and trouble swallowing.   Eyes:  Negative for eye problems.  Respiratory:  Negative for cough and shortness of breath.   Cardiovascular:  Negative for chest pain, leg swelling and palpitations.  Gastrointestinal:  Negative for abdominal pain, constipation, diarrhea, nausea and vomiting.  Genitourinary:  Negative for bladder incontinence, difficulty urinating, dysuria, frequency, hematuria and nocturia.   Musculoskeletal:  Negative for arthralgias, back pain, flank pain, myalgias and neck pain.  Skin:  Negative for itching and rash.  Neurological:  Negative for dizziness, headaches and numbness.  Hematological:  Does not bruise/bleed easily.  Psychiatric/Behavioral:   Negative for depression, sleep disturbance and suicidal ideas. The patient is not nervous/anxious.   All other systems reviewed and are negative.    VITALS:   There were no vitals taken for this visit.  Wt Readings from Last 3 Encounters:  04/06/23 149 lb 14.6 oz (68 kg)  03/30/23 151 lb 6.4 oz (68.7 kg)  03/11/23 145 lb 8.1 oz (66 kg)    There is no height or weight on file to calculate BMI.  Performance status (ECOG): {CHL ONC [id number]  PHYSICAL EXAM:   Physical Exam Vitals and nursing note reviewed. Exam conducted with a chaperone present.  Constitutional:      Appearance: Normal appearance.  Cardiovascular:     Rate and Rhythm: Normal rate and regular rhythm.     Pulses: Normal pulses.     Heart sounds: Normal heart sounds.  Pulmonary:     Effort: Pulmonary effort is normal.     Breath sounds: Normal breath sounds.  Abdominal:     Palpations: Abdomen is soft. There is no hepatomegaly, splenomegaly or mass.     Tenderness: There is no abdominal tenderness.  Musculoskeletal:     Right lower leg: No edema.     Left lower leg: No edema.  Lymphadenopathy:     Cervical: No cervical adenopathy.     Right cervical: No superficial, deep or posterior cervical adenopathy.  Left cervical: No superficial, deep or posterior cervical adenopathy.     Upper Body:     Right upper body: No supraclavicular or axillary adenopathy.     Left upper body: No supraclavicular or axillary adenopathy.  Neurological:     General: No focal deficit present.     Mental Status: She is alert and oriented to person, place, and time.  Psychiatric:        [name] and Affect: [name] normal.        Behavior: Behavior normal.     LABS:      Latest Ref Rng & Units 04/06/2023    5:05 PM 03/30/2023   12:29 PM 03/16/2023    4:48 AM  CBC  WBC 4.0 - 10.5 K/uL 6.5  5.0  5.3   Hemoglobin 12.0 - 15.0 g/dL 9.2  9.1  8.3   Hematocrit 36.0 - 46.0 % 28.5  27.9  24.5   Platelets 150 - 400 K/uL 190  200   150       Latest Ref Rng & Units 03/30/2023   12:29 PM 03/15/2023    4:38 AM 03/14/2023    5:35 AM  CMP  Glucose 70 - 99 mg/dL 78  86  76   BUN 8 - 23 mg/dL 32  16  20   Creatinine 0.44 - 1.00 mg/dL 4.25  9.56  3.87   Sodium 135 - 145 mmol/L 135  133  134   Potassium 3.5 - 5.1 mmol/L 4.1  3.2  3.6   Chloride 98 - 111 mmol/L 102  108  107   CO2 22 - 32 mmol/L 23  19  19    Calcium 8.9 - 10.3 mg/dL 56.4  8.4  9.3      No results found for: "CEA1", "CEA" / No results found for: "CEA1", "CEA" No results found for: "PSA1" No results found for: "PPI951" No results found for: "CAN125"  Lab Results  Component Value Date   TOTALPROTELP 9.0 (H) 03/12/2023   ALBUMINELP 2.5 (L) 03/12/2023   A1GS 0.3 03/12/2023   A2GS 0.6 03/12/2023   BETS 5.6 (H) 03/12/2023   GAMS 0.1 (L) 03/12/2023   MSPIKE 4.9 (H) 03/12/2023   SPEI Comment 03/12/2023   Lab Results  Component Value Date   TIBC 184 (L) 01/26/2023   TIBC 305 01/10/2022   FERRITIN 101 01/26/2023   FERRITIN 18 01/10/2022   IRONPCTSAT 36 (H) 01/26/2023   IRONPCTSAT 21 01/10/2022   No results found for: "LDH"   STUDIES:   DG Chest Port 1 View  Result Date: 04/06/2023 CLINICAL DATA:  weakness. EXAM: PORTABLE CHEST 1 VIEW COMPARISON:  03/11/2023. FINDINGS: Increased interstitial markings are essentially similar to the prior study. No acute consolidation or major lung collapse. Bilateral costophrenic angles are clear. Stable cardio-mediastinal silhouette. Aortic arch calcifications noted. No acute osseous abnormalities. Note is made of bilateral shoulder arthroplasty hardware. The soft tissues are within normal limits. IMPRESSION: No acute cardiopulmonary process. Electronically Signed   By: Jules Schick M.D.   On: 04/06/2023 18:05   CT CHEST ABDOMEN PELVIS W CONTRAST  Addendum Date: 03/11/2023   ADDENDUM REPORT: 03/11/2023 16:34 ADDENDUM: The original report was by Dr. Gaylyn Rong. The following addendum is by Dr. Gaylyn Rong: Please note that while series 2 raises the question of heterogeneous density in the pulmonary arteries which could be an indicator of pulmonary embolus, the IMAR images in series 3 clear up some of the streak artifact and this heterogeneity in  the pulmonary arteries appears to essentially resolve. However, please note that today's exam was not protocol to assess for pulmonary embolus, and has reduced accuracy for pulmonary embolus especially due to the degree of streak artifact. Moreover, one additional point, there is some asymmetric density along the left iliopsoas tendon anterior to the hip, probably from a periprosthetic fluid collection tracking along the iliopsoas bursa. Electronically Signed   By: Gaylyn Rong M.D.   On: 03/11/2023 16:34   Result Date: 03/11/2023 CLINICAL DATA:  Weight loss, hypercalcemia EXAM: CT CHEST, ABDOMEN, AND PELVIS WITH CONTRAST TECHNIQUE: Multidetector CT imaging of the chest, abdomen and pelvis was performed following the standard protocol during bolus administration of intravenous contrast. RADIATION DOSE REDUCTION: This exam was performed according to the departmental dose-optimization program which includes automated exposure control, adjustment of the mA and/or kV according to patient size and/or use of iterative reconstruction technique. CONTRAST:  75mL OMNIPAQUE IOHEXOL 300 MG/ML  SOLN COMPARISON:  Renal ultrasound 01/25/2023 FINDINGS: CT CHEST FINDINGS Cardiovascular: Atherosclerotic vascular calcification of the thoracic aorta. Mild cardiomegaly. Mediastinum/Nodes: Unremarkable Lungs/Pleura: Volume loss with bandlike density in the left lower lobe favoring atelectasis. There is also milder dependent subsegmental atelectasis in the right lower lobe. No lung mass identified. Musculoskeletal: Bilateral reverse shoulder arthroplasties. Bony demineralization. Lower cervical and thoracic spondylosis. Irregular right eighth and tenth ribs, some of this may be  from motion, cannot exclude age indeterminate rib fractures. CT ABDOMEN PELVIS FINDINGS Hepatobiliary: No significant parenchymal lesion identified. There is likely some vascular shunting posteriorly in the inferior margin of the right hepatic lobe. Substantial extrahepatic biliary dilatation, common hepatic duct up to 1.7 cm. Cholecystectomy. Distal CBD up to 1.0 cm. Pancreas: Abnormal stranding and edema particularly around the pancreatic head and pancreatic body, with some relative hypodensity in the pancreatic head and body compared to the pancreatic tail. The appearance may be due to pancreatitis and edema, strictly speaking a small infiltrative pancreatic malignancy is not completely excluded. Spleen: Small size, otherwise unremarkable. Adrenals/Urinary Tract: No hydronephrosis. Nonobstructive 5 mm left mid kidney calculus. Mild cortical thinning compatible with mild atrophy. Stomach/Bowel: Prior gastric bypass. Sigmoid colon diverticulosis with scattered diverticula of the descending colon. Proximal wall thickening in the descending colon likely ascribe a bowl to nondistention although segmental colitis is not excluded. There is some edema in this vicinity which could be from the pancreas, less likely to be related to the colon. Normal appendix. Vascular/Lymphatic: Atherosclerosis is present, including aortoiliac atherosclerotic disease. No substantial stenosis in the celiac trunk or SMA. Reproductive: Uterus absent, adnexa unremarkable. Other: Trace ascites along the left paracolic gutter. Musculoskeletal: Bilateral hip prostheses with substantial lucency around the left acetabular plate component compatible with osteolysis. There is some irregularity of the quadrilateral plate and potentially some stress reaction involving the medial aspect of the left superior pubic ramus. Some of this osteolysis has been present back through 08/22/2013. There is also some demineralization of the right superior pubic ramus  laterally especially anteriorly. Interbody fusion at L2-3. Lumbar spondylosis and degenerative disc disease causing mild multilevel impingement. IMPRESSION: 1. Abnormal stranding and edema around the pancreatic head and pancreatic body, with some relative hypodensity in the pancreatic head and body compared to the pancreatic tail. The appearance may be due to pancreatitis and edema, strictly speaking a small infiltrative pancreatic malignancy is not completely excluded. Correlate with lipase levels and consider follow up pancreatic protocol MRI with and without contrast when feasible. 2. Substantial extrahepatic biliary dilatation, common hepatic duct up to 1.7  cm. This could be related to the pancreatic process. 3. Mild cardiomegaly. 4. Irregular right eighth and tenth ribs, some of this may be from motion, cannot exclude age indeterminate rib fractures. 5. Sigmoid colon diverticulosis. 6. Proximal wall thickening in the descending colon, probably from nondistention although segmental colitis is not excluded. 7. Nonobstructive 5 mm left mid kidney calculus. 8. Bony demineralization. 9. 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 There is some irregularity of the left quadrilateral plate and potentially some stress reaction involving the medial aspect of the left superior pubic ramus. Some of this osteolysis has been present back through 08/22/2013. 10. Lumbar spondylosis and degenerative disc disease causing mild multilevel impingement. 11. Prior gastric bypass. 12. Aortic atherosclerosis. Aortic Atherosclerosis (ICD10-I70.0). Electronically Signed: By: Gaylyn Rong M.D. On: 03/11/2023 16:21   MR Brain W and Wo Contrast  Result Date: 03/11/2023 CLINICAL DATA:  Metastatic disease evaluation. EXAM: MRI HEAD WITHOUT AND WITH CONTRAST TECHNIQUE: Multiplanar, multiecho pulse sequences of the brain and surrounding structures were obtained without and with intravenous  contrast. CONTRAST:  7mL GADAVIST GADOBUTROL 1 MMOL/ML IV SOLN COMPARISON:  Same-[name] CT head FINDINGS: Brain: There is no acute intracranial hemorrhage, extra-axial fluid collection, or acute infarct. There is mild for age parenchymal volume loss with prominence of the ventricular system and extra-axial CSF spaces. There is minimal background chronic small-vessel ischemic change The pituitary and suprasellar region are normal. There is no mass lesion or abnormal enhancement. There is no mass effect or midline shift. Vascular: Normal flow voids. Skull and upper cervical spine: There are numerous calvarial lesions which demonstrate elevated DWI signal. Some of the lesions are intrinsically T1 hyperintense (for example 18-92, 18-84). Sinuses/Orbits: The paranasal sinuses are clear. Bilateral lens implants are in place. The globes and orbits are otherwise unremarkable. Other: The mastoid air cells and middle ear cavities are clear. IMPRESSION: 1. No evidence of intracranial metastatic disease or other acute intracranial pathology. 2. Numerous calvarial lesions as seen on CT are primarily T1 hyperintense which is more typical of benign lesions, with metastatic disease considered less likely. Electronically Signed   By: Lesia Hausen M.D.   On: 03/11/2023 16:23   CT Head Wo Contrast  Result Date: 03/11/2023 CLINICAL DATA:  Head trauma, abnormal mental status (Age 21-64y) EXAM: CT HEAD WITHOUT CONTRAST TECHNIQUE: Contiguous axial images were obtained from the base of the skull through the vertex without intravenous contrast. RADIATION DOSE REDUCTION: This exam was performed according to the departmental dose-optimization program which includes automated exposure control, adjustment of the mA and/or kV according to patient size and/or use of iterative reconstruction technique. COMPARISON:  CT Head 06/09/17, 01/24/23 FINDINGS: Brain: No evidence of acute infarction, hemorrhage, hydrocephalus, extra-axial collection or mass  lesion/mass effect. Sequela of mild chronic microvascular ischemic change Vascular: No hyperdense vessel or unexpected calcification. Skull: There are multiple scattered lucent calvarial lesions, some of which are new compared to 2018, for example the lesion in the right parietal bone (series 3, image 40 Sinuses/Orbits: No middle ear or mastoid effusion. Paranasal sinuses are clear. Bilateral lens replacement. Orbits are otherwise unremarkable. Other: None. IMPRESSION: 1. No CT evidence of intracranial injury 2. Multiple scattered lucent calvarial lesions, some of which are new compared to 2018. While nonspecific, differential considerations include metastatic disease or multiple myeloma. Recommend correlation with clinical history and consider further evaluation with a contrast-enhanced MRI of the brain. Electronically Signed   By: Lorenza Cambridge M.D.   On: 03/11/2023 13:47  DG Chest Portable 1 View  Result Date: 03/11/2023 CLINICAL DATA:  Pain after trauma.  Weakness EXAM: PORTABLE CHEST 1 VIEW COMPARISON:  X-ray 01/23/2023 FINDINGS: Enlarged cardiopericardial silhouette with a tortuous and ectatic aorta. No consolidation, pneumothorax or effusion. No edema. Degenerative changes seen along the spine. 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 IMPRESSION: Enlarged cardiopericardial silhouette.  No edema or consolidation. Electronically Signed   By: Karen Kays M.D.   On: 03/11/2023 13:42

## 2023-04-06 NOTE — ED Notes (Signed)
Date and time results received: 04/06/23 0746   Test: troponin Critical Value: 416  Name of Provider Notified: Charm Barges  Orders Received? Or Actions Taken?: NA

## 2023-04-06 NOTE — ED Notes (Signed)
Pt BP still low, MD notified and fluids ordered.

## 2023-04-06 NOTE — ED Notes (Signed)
Unable to obtain type and screen and lactic acid after several attempts. Lab tech to attempt. Provider aware.

## 2023-04-06 NOTE — ED Provider Notes (Signed)
[facility] EMERGENCY DEPARTMENT AT [facility] Provider Note   CSN: [id number] Arrival date & time: 04/06/23  1541     History {Add pertinent medical, surgical, social history, OB history to HPI:1} Chief Complaint  Patient presents with   Kathy [name]    Kathy [name] is a 82 y.o. female.  She is brought in by EMS from home for generalized weakness and recurrent falls.  Patient's cousin is here and helping with the history.  Patient states she slid out of bed today and ended up on the floor could not get back up.  Cousin states they had to help her up with the assistance of a visiting nurse.  She slept most of the day and really has been sleeping on and off for the last few days.  She has been very weak.  Has not been eating much.  Cousin states she had an abnormal finding on her CAT scan a few weeks ago and is supposed to see the oncologist tomorrow.  Patient lives alone.  She does endorse back pain.   The history is provided by the patient and a relative.  Extremity Weakness This is a new problem. The current episode started more than 1 week ago. The problem occurs constantly. The problem has not changed since onset.Pertinent negatives include no chest pain, no abdominal pain, no headaches and no shortness of breath. Associated symptoms comments: Falls, back pain. Nothing relieves the symptoms. She has tried rest for the symptoms. The treatment provided no relief.       Home Medications Prior to Admission medications   Medication Sig Start Date End Date Taking? Authorizing Provider  acetaminophen (TYLENOL) 500 MG tablet Take 1 tablet (500 mg total) by mouth every 6 (six) hours as needed for mild pain, headache or fever (or Fever >/= 101). 03/16/23   Shon Hale, MD  Adalimumab (HUMIRA PEN) 40 MG/0.4ML PNKT Inject 40 mg into the skin every Saturday.    [provider]  ALPRAZolam Prudy Feeler) 0.25 MG tablet Take 1 tablet (0.25 mg total) by mouth 2 (two) times daily as  needed for anxiety or sleep. TAKE 1 TABLET BY MOUTH TWICE DAILY AS NEEDED FOR ANXIETY OR SLEEP Strength: 0.25 mg 03/16/23   Shon Hale, MD  denosumab (PROLIA) 60 MG/ML SOSY injection inject 60mg  Subcutaneous every 6 months 180 days 10/07/22     denosumab (PROLIA) 60 MG/ML SOSY injection inject 60mg  Subcutaneous every 6 months 180 days 04/05/23     denosumab (PROLIA) 60 MG/ML SOSY injection 60mg  Subcutaneous every 6 months 180 days 04/06/23     diclofenac Sodium (VOLTAREN) 1 % GEL APPLY (1) GRAM TO AFFECTED AREA DAILY. Patient taking differently: Apply 1 g topically daily. 08/11/22   [name], MD  ferrous sulfate 325 (65 FE) MG EC tablet Take 1 tablet (325 mg total) by mouth daily. 03/16/23   Shon Hale, MD  furosemide (LASIX) 20 MG tablet Take 1 tablet (20 mg total) by mouth 2 (two) times daily. Patient taking differently: Take 20 mg by mouth daily as needed for fluid. 03/16/22   [name], MD  levothyroxine (SYNTHROID) 50 MCG tablet Take 1 tablet (50 mcg total) by mouth daily. 03/16/23 03/15/24  Shon Hale, MD  Multiple Vitamin (MULTIVITAMIN WITH MINERALS) TABS tablet Take 1 tablet by mouth every morning.    [provider]  naloxone [facility]) nasal spray 4 mg/0.1 mL Place 1 spray into the nose as needed (opioid reversal).    [provider]  Omega-3 Fatty Acids (FISH OIL PO) Take 1 capsule by mouth daily.    [provider]  omeprazole (PRILOSEC) 40 MG capsule Take 1 capsule (40 mg total) by mouth daily before breakfast. Open capsule and swallow granules with liquid or applesauce 04/15/22   Iva Boop, MD  oxybutynin (DITROPAN XL) 10 MG 24 hr tablet Take 1 tablet (10 mg total) by mouth at bedtime. 02/21/23   [name], MD  oxyCODONE-acetaminophen (PERCOCET/ROXICET) 5-325 MG tablet Take 1 tablet by mouth every 8 (eight) hours as needed for moderate pain or severe pain (pain not relieved by tramadol). 03/16/23   Shon Hale, MD  potassium  chloride SA (KLOR-CON M) 20 MEQ tablet Take 1 tablet (20 mEq total) by mouth daily. TAKE 1 TABLET (20 MEQ) BY MOUTH DAILY PRN with Lasix (EDEMA) 03/16/23   Shon Hale, MD  traMADol (ULTRAM) 50 MG tablet Take 1 tablet (50 mg total) by mouth every 12 (twelve) hours as needed for moderate pain. 03/16/23   Shon Hale, MD      Allergies    Macrobid [nitrofurantoin]    Review of Systems   Review of Systems  Constitutional:  Positive for fatigue. Negative for fever.  HENT:  Negative for sore throat.   Respiratory:  Negative for shortness of breath.   Cardiovascular:  Negative for chest pain.  Gastrointestinal:  Negative for abdominal pain.  Genitourinary:  Negative for dysuria.  Musculoskeletal:  Positive for back pain and extremity weakness.  Neurological:  Negative for headaches.    Physical Exam Updated Vital Signs BP 102/65   Pulse 76   Temp (!) 97.5 F (36.4 C) (Oral)   Resp (!) 22   Ht 5\' 1"  (1.549 m)   Wt 68 kg   SpO2 99%   BMI 28.33 kg/m  Physical Exam Vitals and nursing note reviewed.  Constitutional:      General: She is not in acute distress.    Appearance: Normal appearance. She is well-developed.  HENT:     Head: Normocephalic and atraumatic.  Eyes:     Conjunctiva/sclera: Conjunctivae normal.  Cardiovascular:     Rate and Rhythm: Normal rate and regular rhythm.     Heart sounds: No murmur heard. Pulmonary:     Effort: Pulmonary effort is normal. No respiratory distress.     Breath sounds: Normal breath sounds.  Abdominal:     Palpations: Abdomen is soft.     Tenderness: There is no abdominal tenderness. There is no guarding or rebound.  Musculoskeletal:        General: No tenderness or deformity.     Cervical back: Neck supple.  Skin:    General: Skin is warm and dry.     Capillary Refill: Capillary refill takes less than 2 seconds.  Neurological:     General: No focal deficit present.     Mental Status: She is lethargic.     Sensory: No  sensory deficit.     Motor: No weakness.     ED Results / Procedures / Treatments   Labs (all labs ordered are listed, but only abnormal results are displayed) Labs Reviewed - No data to display  EKG None  Radiology No results found.  Procedures Procedures  {Document cardiac monitor, telemetry assessment procedure when appropriate:1}  Medications Ordered in ED Medications - No data to display  ED Course/ Medical Decision Making/ A&P   {   Click here for ABCD2, HEART and other calculatorsREFRESH Note before signing :1}  Medical Decision Making Amount and/or Complexity of Data Reviewed Labs: ordered. Radiology: ordered.   This patient complains of ***; this involves an extensive number of treatment Options and is a complaint that carries with it a high risk of complications and morbidity. The differential includes ***  I ordered, reviewed and interpreted labs, which included *** I ordered medication *** and reviewed PMP when indicated. I ordered imaging studies which included *** and I independently    visualized and interpreted imaging which showed *** Additional history obtained from *** Previous records obtained and reviewed *** I consulted *** and discussed lab and imaging findings and discussed disposition.  Cardiac monitoring reviewed, *** Social determinants considered, *** Critical Interventions: ***  After the interventions stated above, I reevaluated the patient and found *** Admission and further testing considered, ***   {Document critical care time when appropriate:1} {Document review of labs and clinical decision tools ie heart score, Chads2Vasc2 etc:1}  {Document your independent review of radiology images, and any outside records:1} {Document your discussion with family members, caretakers, and with consultants:1} {Document social determinants of health affecting pt's care:1} {Document your decision making why or why  not admission, treatments were needed:1} Final Clinical Impression(s) / ED Diagnoses Final diagnoses:  None    Rx / DC Orders ED Discharge Orders     None

## 2023-04-06 NOTE — ED Notes (Signed)
Date and time results received: 04/06/23 0747   Test: lactic acid Critical Value: 2.2  Name of Provider Notified: Charm Barges  Orders Received? Or Actions Taken?: NA

## 2023-04-06 NOTE — Telephone Encounter (Signed)
Spoke with Ms Kathy [name] says she found pt on the floor of her home this morning- not sure how to proceed. She said she will try and call the nursing home to see if a referral is needed and will let us know.

## 2023-04-07 ENCOUNTER — Other Ambulatory Visit: Payer: Self-pay

## 2023-04-07 ENCOUNTER — Other Ambulatory Visit (HOSPITAL_COMMUNITY): Payer: Self-pay

## 2023-04-07 ENCOUNTER — Inpatient Hospital Stay: Payer: 59 | Attending: Hematology | Admitting: Hematology

## 2023-04-07 DIAGNOSIS — I959 Hypotension, unspecified: Secondary | ICD-10-CM | POA: Insufficient documentation

## 2023-04-07 DIAGNOSIS — E039 Hypothyroidism, unspecified: Secondary | ICD-10-CM

## 2023-04-07 DIAGNOSIS — N179 Acute kidney failure, unspecified: Secondary | ICD-10-CM | POA: Diagnosis not present

## 2023-04-07 DIAGNOSIS — R296 Repeated falls: Secondary | ICD-10-CM | POA: Diagnosis not present

## 2023-04-07 DIAGNOSIS — Z96642 Presence of left artificial hip joint: Secondary | ICD-10-CM | POA: Diagnosis not present

## 2023-04-07 DIAGNOSIS — K859 Acute pancreatitis without necrosis or infection, unspecified: Secondary | ICD-10-CM | POA: Diagnosis not present

## 2023-04-07 DIAGNOSIS — D638 Anemia in other chronic diseases classified elsewhere: Secondary | ICD-10-CM | POA: Diagnosis present

## 2023-04-07 DIAGNOSIS — Z1152 Encounter for screening for COVID-19: Secondary | ICD-10-CM | POA: Diagnosis not present

## 2023-04-07 DIAGNOSIS — Z96611 Presence of right artificial shoulder joint: Secondary | ICD-10-CM | POA: Diagnosis not present

## 2023-04-07 DIAGNOSIS — E871 Hypo-osmolality and hyponatremia: Secondary | ICD-10-CM | POA: Diagnosis present

## 2023-04-07 DIAGNOSIS — R1310 Dysphagia, unspecified: Secondary | ICD-10-CM

## 2023-04-07 DIAGNOSIS — I10 Essential (primary) hypertension: Secondary | ICD-10-CM | POA: Diagnosis not present

## 2023-04-07 DIAGNOSIS — E46 Unspecified protein-calorie malnutrition: Secondary | ICD-10-CM | POA: Insufficient documentation

## 2023-04-07 DIAGNOSIS — Z9181 History of falling: Secondary | ICD-10-CM | POA: Diagnosis not present

## 2023-04-07 DIAGNOSIS — I129 Hypertensive chronic kidney disease with stage 1 through stage 4 chronic kidney disease, or unspecified chronic kidney disease: Secondary | ICD-10-CM | POA: Diagnosis not present

## 2023-04-07 DIAGNOSIS — E860 Dehydration: Secondary | ICD-10-CM | POA: Diagnosis present

## 2023-04-07 DIAGNOSIS — R5381 Other malaise: Secondary | ICD-10-CM

## 2023-04-07 DIAGNOSIS — Z96612 Presence of left artificial shoulder joint: Secondary | ICD-10-CM | POA: Diagnosis not present

## 2023-04-07 DIAGNOSIS — N3281 Overactive bladder: Secondary | ICD-10-CM | POA: Diagnosis not present

## 2023-04-07 DIAGNOSIS — Z85850 Personal history of malignant neoplasm of thyroid: Secondary | ICD-10-CM | POA: Diagnosis not present

## 2023-04-07 DIAGNOSIS — Z7401 Bed confinement status: Secondary | ICD-10-CM | POA: Diagnosis not present

## 2023-04-07 DIAGNOSIS — R531 Weakness: Secondary | ICD-10-CM

## 2023-04-07 DIAGNOSIS — W1830XA Fall on same level, unspecified, initial encounter: Secondary | ICD-10-CM | POA: Diagnosis present

## 2023-04-07 DIAGNOSIS — M069 Rheumatoid arthritis, unspecified: Secondary | ICD-10-CM | POA: Diagnosis not present

## 2023-04-07 DIAGNOSIS — D539 Nutritional anemia, unspecified: Secondary | ICD-10-CM | POA: Diagnosis present

## 2023-04-07 DIAGNOSIS — K8501 Idiopathic acute pancreatitis with uninfected necrosis: Secondary | ICD-10-CM

## 2023-04-07 DIAGNOSIS — G4700 Insomnia, unspecified: Secondary | ICD-10-CM | POA: Diagnosis not present

## 2023-04-07 DIAGNOSIS — E785 Hyperlipidemia, unspecified: Secondary | ICD-10-CM | POA: Diagnosis present

## 2023-04-07 DIAGNOSIS — Z8249 Family history of ischemic heart disease and other diseases of the circulatory system: Secondary | ICD-10-CM | POA: Diagnosis not present

## 2023-04-07 DIAGNOSIS — C9000 Multiple myeloma not having achieved remission: Secondary | ICD-10-CM | POA: Diagnosis not present

## 2023-04-07 DIAGNOSIS — R1313 Dysphagia, pharyngeal phase: Secondary | ICD-10-CM | POA: Diagnosis not present

## 2023-04-07 DIAGNOSIS — K8500 Idiopathic acute pancreatitis without necrosis or infection: Secondary | ICD-10-CM | POA: Diagnosis present

## 2023-04-07 DIAGNOSIS — R571 Hypovolemic shock: Secondary | ICD-10-CM | POA: Diagnosis not present

## 2023-04-07 DIAGNOSIS — R6889 Other general symptoms and signs: Secondary | ICD-10-CM | POA: Diagnosis not present

## 2023-04-07 DIAGNOSIS — N1832 Chronic kidney disease, stage 3b: Secondary | ICD-10-CM | POA: Insufficient documentation

## 2023-04-07 DIAGNOSIS — Z96652 Presence of left artificial knee joint: Secondary | ICD-10-CM | POA: Diagnosis not present

## 2023-04-07 DIAGNOSIS — I4891 Unspecified atrial fibrillation: Secondary | ICD-10-CM | POA: Diagnosis not present

## 2023-04-07 DIAGNOSIS — E43 Unspecified severe protein-calorie malnutrition: Secondary | ICD-10-CM | POA: Diagnosis present

## 2023-04-07 DIAGNOSIS — K760 Fatty (change of) liver, not elsewhere classified: Secondary | ICD-10-CM | POA: Diagnosis present

## 2023-04-07 DIAGNOSIS — E8809 Other disorders of plasma-protein metabolism, not elsewhere classified: Secondary | ICD-10-CM | POA: Diagnosis not present

## 2023-04-07 DIAGNOSIS — C73 Malignant neoplasm of thyroid gland: Secondary | ICD-10-CM | POA: Diagnosis not present

## 2023-04-07 DIAGNOSIS — Y929 Unspecified place or not applicable: Secondary | ICD-10-CM | POA: Diagnosis not present

## 2023-04-07 DIAGNOSIS — Z5112 Encounter for antineoplastic immunotherapy: Secondary | ICD-10-CM | POA: Insufficient documentation

## 2023-04-07 DIAGNOSIS — K219 Gastro-esophageal reflux disease without esophagitis: Secondary | ICD-10-CM | POA: Diagnosis not present

## 2023-04-07 DIAGNOSIS — D631 Anemia in chronic kidney disease: Secondary | ICD-10-CM | POA: Diagnosis not present

## 2023-04-07 DIAGNOSIS — Z833 Family history of diabetes mellitus: Secondary | ICD-10-CM | POA: Diagnosis not present

## 2023-04-07 DIAGNOSIS — M810 Age-related osteoporosis without current pathological fracture: Secondary | ICD-10-CM | POA: Diagnosis not present

## 2023-04-07 DIAGNOSIS — Z743 Need for continuous supervision: Secondary | ICD-10-CM | POA: Diagnosis not present

## 2023-04-07 DIAGNOSIS — N410 Acute prostatitis: Secondary | ICD-10-CM | POA: Insufficient documentation

## 2023-04-07 LAB — CBC
HCT: 25 % — ABNORMAL LOW (ref 36.0–46.0)
Hemoglobin: 8 g/dL — ABNORMAL LOW (ref 12.0–15.0)
MCH: 33.3 pg (ref 26.0–34.0)
MCHC: 32 g/dL (ref 30.0–36.0)
MCV: 104.2 fL — ABNORMAL HIGH (ref 80.0–100.0)
Platelets: 167 10*3/uL (ref 150–400)
RBC: 2.4 MIL/uL — ABNORMAL LOW (ref 3.87–5.11)
RDW: 19 % — ABNORMAL HIGH (ref 11.5–15.5)
WBC: 6.1 10*3/uL (ref 4.0–10.5)
nRBC: 0.5 % — ABNORMAL HIGH (ref 0.0–0.2)

## 2023-04-07 LAB — COMPREHENSIVE METABOLIC PANEL
ALT: 29 U/L (ref 0–44)
AST: 78 U/L — ABNORMAL HIGH (ref 15–41)
Albumin: <1.5 g/dL — ABNORMAL LOW (ref 3.5–5.0)
Alkaline Phosphatase: 55 U/L (ref 38–126)
Anion gap: 6 (ref 5–15)
BUN: 24 mg/dL — ABNORMAL HIGH (ref 8–23)
CO2: 23 mmol/L (ref 22–32)
Calcium: 11.3 mg/dL — ABNORMAL HIGH (ref 8.9–10.3)
Chloride: 106 mmol/L (ref 98–111)
Creatinine, Ser: 1.11 mg/dL — ABNORMAL HIGH (ref 0.44–1.00)
GFR, Estimated: 50 mL/min — ABNORMAL LOW (ref >60)
Glucose, Bld: 78 mg/dL (ref 70–99)
Potassium: 4 mmol/L (ref 3.5–5.1)
Sodium: 135 mmol/L (ref 135–145)
Total Bilirubin: 0.5 mg/dL (ref 0.3–1.2)
Total Protein: 8.4 g/dL — ABNORMAL HIGH (ref 6.5–8.1)

## 2023-04-07 LAB — PHOSPHORUS: Phosphorus: 1.9 mg/dL — ABNORMAL LOW (ref 2.5–4.6)

## 2023-04-07 LAB — MRSA NEXT GEN BY PCR, NASAL: MRSA by PCR Next Gen: NOT DETECTED

## 2023-04-07 LAB — MAGNESIUM: Magnesium: 1.7 mg/dL (ref 1.7–2.4)

## 2023-04-07 MED ORDER — ALBUMIN HUMAN 25 % IV SOLN
25.0000 g | Freq: Once | INTRAVENOUS | Status: AC
  Administered 2023-04-07: 25 g via INTRAVENOUS
  Filled 2023-04-07: qty 100

## 2023-04-07 MED ORDER — PROLIA 60 MG/ML ~~LOC~~ SOSY: PREFILLED_SYRINGE | SUBCUTANEOUS | 1 refills | Status: DC

## 2023-04-07 MED ORDER — LEVOTHYROXINE SODIUM 50 MCG PO TABS
50.0000 ug | ORAL_TABLET | Freq: Every day | ORAL | Status: DC
  Administered 2023-04-07 – 2023-04-12 (×5): 50 ug via ORAL
  Filled 2023-04-07: qty 1
  Filled 2023-04-07 (×2): qty 2
  Filled 2023-04-07: qty 1
  Filled 2023-04-07: qty 2

## 2023-04-07 MED ORDER — NOREPINEPHRINE 4 MG/250ML-% IV SOLN
2.0000 ug/min | INTRAVENOUS | Status: DC
  Administered 2023-04-07: 2 ug/min via INTRAVENOUS
  Administered 2023-04-08: 5 ug/min via INTRAVENOUS
  Administered 2023-04-09: 2 ug/min via INTRAVENOUS
  Filled 2023-04-07 (×3): qty 250

## 2023-04-07 MED ORDER — PANTOPRAZOLE SODIUM 40 MG IV SOLR
40.0000 mg | INTRAVENOUS | Status: DC
  Administered 2023-04-07 – 2023-04-12 (×6): 40 mg via INTRAVENOUS
  Filled 2023-04-07 (×6): qty 10

## 2023-04-07 MED ORDER — METRONIDAZOLE 500 MG/100ML IV SOLN
500.0000 mg | Freq: Once | INTRAVENOUS | Status: AC
  Administered 2023-04-07: 500 mg via INTRAVENOUS
  Filled 2023-04-07: qty 100

## 2023-04-07 MED ORDER — LACTATED RINGERS IV BOLUS
1000.0000 mL | Freq: Once | INTRAVENOUS | Status: AC
  Administered 2023-04-07: 1000 mL via INTRAVENOUS

## 2023-04-07 MED ORDER — SODIUM CHLORIDE 0.9 % IV SOLN
2.0000 g | Freq: Once | INTRAVENOUS | Status: AC
  Administered 2023-04-07: 2 g via INTRAVENOUS
  Filled 2023-04-07: qty 12.5

## 2023-04-07 MED ORDER — SODIUM CHLORIDE 0.9 % IV SOLN: 250.0000 mL | INTRAVENOUS | Status: DC

## 2023-04-07 MED ORDER — CHLORHEXIDINE GLUCONATE CLOTH 2 % EX PADS
6.0000 | MEDICATED_PAD | Freq: Every day | CUTANEOUS | Status: DC
  Administered 2023-04-07 – 2023-04-10 (×4): 6 via TOPICAL

## 2023-04-07 MED ORDER — ONDANSETRON HCL 4 MG/2ML IJ SOLN: 4.0000 mg | Freq: Four times a day (QID) | INTRAMUSCULAR | Status: DC | PRN

## 2023-04-07 MED ORDER — LACTATED RINGERS IV SOLN: INTRAVENOUS | Status: AC

## 2023-04-07 MED ORDER — ZOLEDRONIC ACID 4 MG/100ML IV SOLN
4.0000 mg | Freq: Once | INTRAVENOUS | Status: AC
  Administered 2023-04-07: 4 mg via INTRAVENOUS
  Filled 2023-04-07: qty 100

## 2023-04-07 MED ORDER — MIDODRINE HCL 5 MG PO TABS
5.0000 mg | ORAL_TABLET | Freq: Three times a day (TID) | ORAL | Status: DC
  Administered 2023-04-07 – 2023-04-09 (×7): 5 mg via ORAL
  Filled 2023-04-07 (×7): qty 1

## 2023-04-07 MED ORDER — ENOXAPARIN SODIUM 40 MG/0.4ML IJ SOSY
40.0000 mg | PREFILLED_SYRINGE | INTRAMUSCULAR | Status: DC
  Administered 2023-04-07 – 2023-04-11 (×5): 40 mg via SUBCUTANEOUS
  Filled 2023-04-07 (×5): qty 0.4

## 2023-04-07 NOTE — Evaluation (Signed)
Occupational Therapy Evaluation Patient Details Name: Kathy [name] MRN: [id number] DOB: 1940/12/01 Today's Date: 04/07/2023   History of Present Illness Kathy [name] is a 82 y.o. 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  Patient states she slid out of bed and fell on the floor, but was unable to get up.  A friend of hers who was unable to reach her on the phone stopped by to check on her and saw her on the floor, EMS was activated and patient was sent to the ED for further evaluation and management.  Patient endorsed generalized weakness that has been ongoing for a few days, she has been having difficulty in ambulation due to weakness.  Patient ambulates with a walker at baseline and she lives alone.  Patient was recently admitted from 8/9 to 03/16/2023 due to persistent hypercalcemia, generalized weakness and deconditioning with history of falls, she was evaluated by PT and SNF rehab was recommended.  She was also admitted from 6/23 to 01/31/2023 due to hypercalcemia   Clinical Impression   Pt seated up in chair on OT arrival, agreeable to evaluation. Pt with very limited BUE ROM and strength. Pt reports she is independent at baseline but was having a lot of difficulty with ADLs at home. Pt with significantly limited mobilty and strength, requiring mod to max assist with most ADLs. Recommend continued skilled OT services to increase strength, safety, and promote increased independence in ADLs and functional mobility.        If plan is discharge home, recommend the following: A lot of help with walking and/or transfers;A lot of help with bathing/dressing/bathroom;Assistance with cooking/housework;Assist for transportation;Help with stairs or ramp for entrance    Functional Status Assessment  Patient has  had a recent decline in their functional status and demonstrates the ability to make significant improvements in function in a reasonable and predictable amount of time.  Equipment Recommendations  None recommended by OT       Precautions / Restrictions Precautions Precautions: Fall Restrictions Weight Bearing Restrictions: No      Mobility Bed Mobility               General bed mobility comments: Pt up in chair on OT arrival    Transfers                   General transfer comment: Defer to PT eval-pt seated in chair on OT arrival          ADL either performed or assessed with clinical judgement   ADL Overall ADL's : Needs assistance/impaired Eating/Feeding: Set up;Sitting Eating/Feeding Details (indicate cue type and reason): Able to bring food to mouth, somewhat unsteady and reports a lot of spillage Grooming: Set up;Sitting Grooming Details (indicate cue type and reason): unable to ambulate to the sink for grooming tasks Upper Body Bathing: Moderate assistance;Sitting   Lower Body Bathing: Maximal assistance;Sitting/lateral leans;Sit to/from stand   Upper Body Dressing : Minimal assistance;Sitting   Lower Body Dressing: Maximal assistance;Sitting/lateral leans;Sit to/from stand                 General ADL Comments: Pt with generalized weakness, currently unable to stand for tasks or perform functional mobility      Pertinent Vitals/Pain Pain Assessment Pain Assessment: No/denies pain     Extremity/Trunk Assessment Upper Extremity Assessment Upper  Extremity Assessment: RUE deficits/detail;LUE deficits/detail;Generalized weakness RUE Deficits / Details: ROM ~25%, 2+/5 shoulder strength, 3+/5 elbow, wrist strength, decreased grip RUE Sensation: WNL RUE Coordination: WNL LUE Deficits / Details: ROM ~25%, 2+/5 shoulder strength, 3+/5 elbow, wrist strength, decreased grip LUE Sensation: WNL LUE Coordination: WNL   Lower Extremity  Assessment Lower Extremity Assessment: Defer to PT evaluation LLE Deficits / Details: grossly -3/5, unable to flex left knee due to contracture LLE: Unable to fully assess due to immobilization LLE Sensation: WNL LLE Coordination: WNL   Cervical / Trunk Assessment Cervical / Trunk Assessment: Kyphotic   Communication Communication Communication: No apparent difficulties   Cognition Arousal: Alert Behavior During Therapy: WFL for tasks assessed/performed Overall Cognitive Status: Within Functional Limits for tasks assessed                                                  Home Living Family/patient expects to be discharged to:: Private residence Living Arrangements: Alone Available Help at Discharge: Family;Available PRN/intermittently Type of Home: House Home Access: Stairs to enter [employer] of Steps: 4 Entrance Stairs-Rails: Right;Can reach both Home Layout: One level     Bathroom Shower/Tub: Producer, television/film/video: Standard Bathroom Accessibility: Yes   Home Equipment: Agricultural consultant (2 wheels);Cane - single point;BSC/3in1;Tub bench;Grab bars - tub/shower   Additional Comments: Pt reports she was living alone and had a friend or family checking on her sometimes      Prior Functioning/Environment Prior Level of Function : Independent/Modified Independent;Driving             Mobility Comments: Pt reports SPC for household ambulation. RW for community ambulation ADLs Comments: Assisted by family as needed        OT Problem List: Decreased strength;Decreased range of motion;Decreased activity tolerance;Impaired balance (sitting and/or standing);Decreased safety awareness;Decreased knowledge of use of DME or AE;Impaired UE functional use      OT Treatment/Interventions: Self-care/ADL training;Therapeutic exercise;DME and/or AE instruction;Therapeutic activities;Patient/family education    OT Goals(Current goals can be  found in the care plan section) Acute Rehab OT Goals Patient Stated Goal: To get stronger OT Goal Formulation: With patient Time For Goal Achievement: 04/21/23 Potential to Achieve Goals: Good  OT Frequency: Min 1X/week       AM-PAC OT "6 Clicks" Daily Activity     Outcome Measure Help from another person eating meals?: A Little Help from another person taking care of personal grooming?: A Lot Help from another person toileting, which includes using toliet, bedpan, or urinal?: A Lot Help from another person bathing (including washing, rinsing, drying)?: A Lot Help from another person to put on and taking off regular upper body clothing?: A Lot Help from another person to put on and taking off regular lower body clothing?: A Lot 6 Click Score: 13   End of Session    Activity Tolerance: Patient tolerated treatment well Patient left: in chair;with call bell/phone within reach  OT Visit Diagnosis: Repeated falls (R29.6);Muscle weakness (generalized) (M62.81)                Time: 1341-1400 OT Time Calculation (min): 19 min Charges:  OT General Charges $OT Visit: 1 Visit OT Evaluation $OT Eval Low Complexity: 1 Low  Ezra Sites, OTR/L  [phone] 04/07/2023, 2:16 PM

## 2023-04-07 NOTE — Progress Notes (Signed)
Patient seen and examined; admitted after midnight secondary to generalized weakness, dehydration, deconditioning, hypercalcemia and history of falls at home.  Patient also found with hypotension requiring the use of pressors to stabilize vital signs.  Patient failed to improve blood pressure with fluid resuscitation.  No chest pain, no nausea, no vomiting, reports anorexia.  Please refer to H&P written by Dr. Thomes Dinning on 04/07/2023 for further info/details on admission.  Plan: -Will start midodrine; wean off pressors as tolerated -Replete electrolytes as needed and follow trend. -Physical therapy evaluation and if needed assisted by [facility] for skilled nursing facility placement. -Continue supportive care and pending calcium fluctuation initiate treatment with Zometa if required.  Vassie Loll MD [phone]

## 2023-04-07 NOTE — Consult Note (Signed)
[facility] Consultation Oncology  Name: Kathy [name]      MRN: [id number]    Location: IC08/IC08-01  Date: 04/07/2023 Time:3:11 PM   REFERRING PHYSICIAN: Dr. 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  She was supposed to see me in the office today but has been hospitalized yesterday with generalized weakness, dehydration, hypercalcemia and history of falls at home.  Also found to have hypotension and required pressors.  She is currently receiving low-dose Levophed with plans to start midodrine.  She reports that she has been living by herself at home.  Her 2 brothers live in [place].  She is a retired Engineer, site.  Non-smoker.  1 brother had thyroid cancer.  Another brother had prostate cancer.  Father had lung cancer.  PAST MEDICAL HISTORY:   Past Medical History:  Diagnosis Date   Abnormal findings on diagnostic imaging of heart and coronary circulation    Anemia    FROM BLEEDING ULCER   Anxiety    takes Alprazolam daily as needed   Arthritis    dx with RA 2017   Bariatric surgery status    Cataract    Diverticulosis    Gastro-esophageal reflux disease with esophagitis    Gastrojejunal ulcer with hemorrhage    Headache(784.0)    occasionally   History of blood transfusion    no abnormal reaction noted   History of bronchitis    last time many yrs ago   Hyperlipidemia    PT DENIES THIS DX -  ON NO MEDS AND NO ONE HAS TOLD HER   Hypertension    takes Amlodipine daily   Hypothyroidism    takes Synthroid daily   Insomnia    Joint pain    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   Localized edema    Nocturia    Numbness    occasionally left arm at night   Obesity    Osteoporosis    takes Fosamax weekly   Pain in joint involving pelvic region and  thigh    Peripheral edema    takes Lasix daily as needed   PONV (postoperative nausea and vomiting)    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/2017   Rheumatoid arthritis, unspecified (HCC)    Rotator cuff arthropathy, 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     PAST SURGICAL HISTORY Past Surgical History:  Procedure Laterality Date   ABDOMINAL HYSTERECTOMY     partial   cataract surgery Bilateral    CHOLECYSTECTOMY     COLONOSCOPY     ESOPHAGOGASTRODUODENOSCOPY (EGD) WITH PROPOFOL N/A 01/11/2022   Procedure: ESOPHAGOGASTRODUODENOSCOPY (EGD) WITH PROPOFOL;  Surgeon: Iva Boop, MD;  Location: [facility] ENDOSCOPY;  Service: Gastroenterology;  Laterality: N/A;   EYE SURGERY     CATARACTS BOTH   gastric bypass surgery  HOT HEMOSTASIS N/A 01/11/2022   Procedure: HOT HEMOSTASIS (ARGON PLASMA COAGULATION/BICAP);  Surgeon: Iva Boop, MD;  Location: [facility] ENDOSCOPY;  Service: Gastroenterology;  Laterality: N/A;   JOINT REPLACEMENT Bilateral    hip   REVERSE SHOULDER ARTHROPLASTY Right 05/11/2016   Procedure: REVERSE SHOULDER ARTHROPLASTY;  Surgeon: Teryl Lucy, MD;  Location: [facility] OR;  Service: Orthopedics;  Laterality: Right;   REVISION TOTAL HIP ARTHROPLASTY Left 10/03/2013   DR Turner Daniels   ROTATOR CUFF REPAIR     SCLEROTHERAPY  01/11/2022   Procedure: Susa Day;  Surgeon: Iva Boop, MD;  Location: [facility] ENDOSCOPY;  Service: Gastroenterology;;   TOTAL HIP REVISION Left 10/03/2013   Procedure: TOTAL HIP REVISION-  left;  Surgeon: Nestor Lewandowsky, MD;  Location: [facility] OR;  Service: Orthopedics;  Laterality: Left;   TOTAL KNEE ARTHROPLASTY Left 11/29/2017   Procedure: LEFT TOTAL KNEE ARTHROPLASTY;  Surgeon: Teryl Lucy, MD;  Location: [facility] OR;  Service: Orthopedics;  Laterality: Left;   TOTAL SHOULDER ARTHROPLASTY Left 11/09/2016   Procedure: TOTAL REVERSE SHOULDER ARTHROPLASTY;  Surgeon: Teryl Lucy, MD;  Location: [facility] OR;  Service: Orthopedics;  Laterality: Left;   TOTAL SHOULDER REPLACEMENT Left 10/2016   TOTAL THYROIDECTOMY     UPPER GASTROINTESTINAL ENDOSCOPY      FAMILY HISTORY: Family History  Problem Relation Age of Onset   Diabetes Mother    Congestive Heart Failure Mother    Lung cancer Father    Thrombosis Sister    CAD Brother        CABG   Colon cancer Neg Hx    Esophageal cancer Neg Hx    Stomach cancer Neg Hx    Rectal cancer Neg Hx     SOCIAL HISTORY:  reports that she has never smoked. She has never been exposed to tobacco smoke. She has never used smokeless tobacco. She reports that she does not drink alcohol and does not use drugs.  PERFORMANCE STATUS: The patient's performance status is 2 - Symptomatic, <50% confined to bed  PHYSICAL EXAM: Most Recent Vital Signs: Blood pressure (!) 104/56, pulse 71, temperature (!) 97.5 F (36.4 C), temperature source Oral, resp. rate (!) 21, height 5\' 1"  (1.549 m), weight 149 lb 14.6 oz (68 kg), SpO2 96%. BP (!) 99/54   Pulse 65   Temp (!) 97.5 F (36.4 C) (Oral)   Resp 13   Ht 5\' 1"  (1.549 m)   Wt 149 lb 14.6 oz (68 kg)   SpO2 99%   BMI 28.33 kg/m  General appearance: alert, cooperative, and appears stated age Lungs: clear to auscultation bilaterally Abdomen:  Soft, nontender with no palpable organomegaly. Extremities:  No edema or cyanosis. Neurologic: Grossly normal  LABORATORY DATA:  Results for orders placed or performed during the hospital encounter of 04/06/23 (from the past 48 hour(s))  Urinalysis, Routine w reflex  microscopic -Urine, Clean Catch     Status: Abnormal   Collection Time: 04/06/23  4:56 PM  Result Value Ref Range   Color, Urine YELLOW YELLOW   APPearance CLEAR CLEAR   Specific Gravity, Urine 1.017 1.005 - 1.030   pH 6.0 5.0 - 8.0   Glucose, UA NEGATIVE NEGATIVE mg/dL   Hgb urine dipstick NEGATIVE NEGATIVE   Bilirubin Urine NEGATIVE NEGATIVE   Ketones, ur NEGATIVE NEGATIVE mg/dL   Protein, ur 30 (A) NEGATIVE mg/dL   Nitrite NEGATIVE NEGATIVE   Leukocytes,Ua SMALL (A) NEGATIVE   RBC / HPF 0-5 0 - 5 RBC/hpf   WBC, UA 6-10 0 -  5 WBC/hpf   Bacteria, UA NONE SEEN NONE SEEN   Squamous Epithelial / HPF 0-5 0 - 5 /HPF   Hyaline Casts, UA PRESENT     Comment: Performed at [facility], [address], [place], [place] [address]  SARS Coronavirus 2 by RT PCR (hospital order, performed in [facility] hospital lab) *cepheid single result test* Anterior Nasal Swab     Status: None   Collection Time: 04/06/23  4:56 PM   Specimen: Anterior Nasal Swab  Result Value Ref Range   SARS Coronavirus 2 by RT PCR NEGATIVE NEGATIVE    Comment: (NOTE) SARS-CoV-2 target nucleic acids are NOT DETECTED.  The SARS-CoV-2 RNA is generally detectable in upper and lower respiratory specimens during the acute phase of infection. The lowest concentration of SARS-CoV-2 viral copies this assay can detect is 250 copies / mL. A negative result does not preclude SARS-CoV-2 infection and should not be used as the sole basis for treatment or other patient management decisions.  A negative result may occur with improper specimen collection / handling, submission of specimen other than nasopharyngeal swab, presence of viral mutation(s) within the areas targeted by this assay, and inadequate number of viral copies (<250 copies / mL). A negative result must be combined with clinical observations, patient history, and epidemiological information.  Fact Sheet for Patients:    RoadLapTop.co.za  Fact Sheet for Healthcare Providers: http://kim-miller.com/  This test is not yet approved or  cleared by the [place] FDA and has been authorized for detection and/or diagnosis of SARS-CoV-2 by FDA under an Emergency Use Authorization (EUA).  This EUA will remain in effect (meaning this test can be used) for the duration of the COVID-19 declaration under Section 564(b)(1) of the Act, 21 U.S.C. section 360bbb-3(b)(1), unless the authorization is terminated or revoked sooner.  Performed at [facility], [address], [place], [place] [address]   CBC with Differential     Status: Abnormal   Collection Time: 04/06/23  5:05 PM  Result Value Ref Range   WBC 6.5 4.0 - 10.5 K/uL   RBC 2.78 (L) 3.87 - 5.11 MIL/uL   Hemoglobin 9.2 (L) 12.0 - 15.0 g/dL   HCT 09.8 (L) 11.9 - 14.7 %   MCV 102.5 (H) 80.0 - 100.0 fL   MCH 33.1 26.0 - 34.0 pg   MCHC 32.3 30.0 - 36.0 g/dL   RDW 82.9 (H) 56.2 - 13.0 %   Platelets 190 150 - 400 K/uL   nRBC 0.9 (H) 0.0 - 0.2 %   Neutrophils Relative % 66 %   Neutro Abs 4.3 1.7 - 7.7 K/uL   Lymphocytes Relative 21 %   Lymphs Abs 1.3 0.7 - 4.0 K/uL   Monocytes Relative 12 %   Monocytes Absolute 0.8 0.1 - 1.0 K/uL   Eosinophils Relative 0 %   Eosinophils Absolute 0.0 0.0 - 0.5 K/uL   Basophils Relative 0 %   Basophils Absolute 0.0 0.0 - 0.1 K/uL   Immature Granulocytes 1 %   Abs Immature Granulocytes 0.03 0.00 - 0.07 K/uL    Comment: Performed at [facility], [address], [place], [place] [address]  Type and screen [facility]     Status: None   Collection Time: 04/06/23  5:05 PM  Result Value Ref Range   ABO/RH(D) AB POS    Antibody Screen NEG    Sample Expiration      04/09/2023,2359 Performed at [facility], [address] [place], [place] [address]  Troponin I (High Sensitivity)     Status: Abnormal   Collection Time: 04/06/23  5:50 PM  Result Value Ref Range    Troponin I (High Sensitivity) 416 (HH) <18 ng/L    Comment: CRITICAL RESULT CALLED TO, READ BACK BY AND VERIFIED WITH THOMPSON,R ON 04/06/23 AT 1945 BY LOY,C (NOTE) Elevated high sensitivity troponin I (hsTnI) values and significant  changes across serial measurements may suggest ACS but many other  chronic and acute conditions are known to elevate hsTnI results.  Refer to the "Links" section for chest pain algorithms and additional  guidance. Performed at [facility], [address], [place], [place] [address]   Lactic acid, plasma     Status: Abnormal   Collection Time: 04/06/23  5:50 PM  Result Value Ref Range   Lactic Acid, Venous 2.2 (HH) 0.5 - 1.9 mmol/L    Comment: CRITICAL RESULT CALLED TO, READ BACK BY AND VERIFIED WITH THOMPSON,R ON 04/06/23 AT 1945 BY LOY,C Performed at [facility], [address] [place], [place] [address]   Lactic acid, plasma     Status: Abnormal   Collection Time: 04/06/23  8:41 PM  Result Value Ref Range   Lactic Acid, Venous 2.0 (HH) 0.5 - 1.9 mmol/L    Comment: CRITICAL VALUE NOTED.  VALUE IS CONSISTENT WITH PREVIOUSLY REPORTED AND CALLED VALUE. Performed at [facility], [address], [place], [place] [address]   Troponin I (High Sensitivity)     Status: Abnormal   Collection Time: 04/06/23  8:41 PM  Result Value Ref Range   Troponin I (High Sensitivity) 315 (HH) <18 ng/L    Comment: CRITICAL RESULT CALLED TO, READ BACK BY AND VERIFIED WITH JONES,S ON 04/06/23 AT 2145 BY LOY,C (NOTE) Elevated high sensitivity troponin I (hsTnI) values and significant  changes across serial measurements may suggest ACS but many other  chronic and acute conditions are known to elevate hsTnI results.  Refer to the "Links" section for chest pain algorithms and additional  guidance. Performed at [facility], [address] [place], [place] [address]   Protime-INR     Status: Abnormal   Collection Time: 04/06/23  8:41 PM  Result Value Ref Range   Prothrombin Time  19.2 (H) 11.4 - 15.2 seconds   INR 1.6 (H) 0.8 - 1.2    Comment: (NOTE) INR goal varies based on device and disease states. Performed at [facility], [address] [place], [place] [address]   Comprehensive metabolic panel     Status: Abnormal   Collection Time: 04/06/23  8:41 PM  Result Value Ref Range   Sodium 136 135 - 145 mmol/L   Potassium 4.3 3.5 - 5.1 mmol/L   Chloride 107 98 - 111 mmol/L   CO2 20 (L) 22 - 32 mmol/L   Glucose, Bld 77 70 - 99 mg/dL    Comment: Glucose reference range applies only to samples taken after fasting for at least 8 hours.   BUN 26 (H) 8 - 23 mg/dL   Creatinine, Ser 4.13 (H) 0.44 - 1.00 mg/dL   Calcium 24.4 (H) 8.9 - 10.3 mg/dL   Total Protein 9.2 (H) 6.5 - 8.1 g/dL   Albumin <0.1 (L) 3.5 - 5.0 g/dL   AST 77 (H) 15 - 41 U/L   ALT 28 0 - 44 U/L   Alkaline Phosphatase 60 38 - 126 U/L   Total Bilirubin 0.5 0.3 - 1.2 mg/dL   GFR, Estimated 43 (L) >60 mL/min    Comment: (  NOTE) Calculated using the CKD-EPI Creatinine Equation (2021)    Anion gap 9 5 - 15    Comment: Performed at [facility], [address], [place], [place] [address]  Lipase, blood     Status: Abnormal   Collection Time: 04/06/23  8:41 PM  Result Value Ref Range   Lipase 306 (H) 11 - 51 U/L    Comment: Performed at [facility], [address], [place], [place] [address]  Culture, blood (single)     Status: None (Preliminary result)   Collection Time: 04/07/23  1:02 AM   Specimen: BLOOD RIGHT HAND  Result Value Ref Range   Specimen Description BLOOD RIGHT HAND    Special Requests      BOTTLES DRAWN AEROBIC AND ANAEROBIC Blood Culture adequate volume   Culture      NO GROWTH < 12 HOURS Performed at [facility], [address], [place], [place] [address]    Report Status PENDING   Comprehensive metabolic panel     Status: Abnormal   Collection Time: 04/07/23  7:15 AM  Result Value Ref Range   Sodium 135 135 - 145 mmol/L   Potassium 4.0 3.5 - 5.1 mmol/L   Chloride 106 98 -  111 mmol/L   CO2 23 22 - 32 mmol/L   Glucose, Bld 78 70 - 99 mg/dL    Comment: Glucose reference range applies only to samples taken after fasting for at least 8 hours.   BUN 24 (H) 8 - 23 mg/dL   Creatinine, Ser 2.44 (H) 0.44 - 1.00 mg/dL   Calcium 01.0 (H) 8.9 - 10.3 mg/dL   Total Protein 8.4 (H) 6.5 - 8.1 g/dL   Albumin <2.7 (L) 3.5 - 5.0 g/dL   AST 78 (H) 15 - 41 U/L   ALT 29 0 - 44 U/L   Alkaline Phosphatase 55 38 - 126 U/L   Total Bilirubin 0.5 0.3 - 1.2 mg/dL   GFR, Estimated 50 (L) >60 mL/min    Comment: (NOTE) Calculated using the CKD-EPI Creatinine Equation (2021)    Anion gap 6 5 - 15    Comment: Performed at [facility], [address], [place], [place] [address]  CBC     Status: Abnormal   Collection Time: 04/07/23  7:15 AM  Result Value Ref Range   WBC 6.1 4.0 - 10.5 K/uL   RBC 2.40 (L) 3.87 - 5.11 MIL/uL   Hemoglobin 8.0 (L) 12.0 - 15.0 g/dL   HCT 44.0 (L) 34.7 - 42.5 %   MCV 104.2 (H) 80.0 - 100.0 fL   MCH 33.3 26.0 - 34.0 pg   MCHC 32.0 30.0 - 36.0 g/dL   RDW 95.6 (H) 38.7 - 56.4 %   Platelets 167 150 - 400 K/uL   nRBC 0.5 (H) 0.0 - 0.2 %    Comment: Performed at [facility], [address] [place], [place] [address]  Magnesium     Status: None   Collection Time: 04/07/23  7:15 AM  Result Value Ref Range   Magnesium 1.7 1.7 - 2.4 mg/dL    Comment: Performed at [facility], [address] [place], [place] [address]  Phosphorus     Status: Abnormal   Collection Time: 04/07/23  7:15 AM  Result Value Ref Range   Phosphorus 1.9 (L) 2.5 - 4.6 mg/dL    Comment: Performed at [facility], [address] [place], [place] [address]  MRSA Next Gen by PCR, Nasal     Status: None  Collection Time: 04/07/23  7:18 AM   Specimen: Nasal Mucosa; Nasal Swab  Result Value Ref Range   MRSA by PCR Next Gen NOT DETECTED NOT DETECTED    Comment: (NOTE) The GeneXpert MRSA Assay (FDA approved for NASAL specimens only), is one component of a comprehensive MRSA colonization  surveillance program. It is not intended to diagnose MRSA infection nor to guide or monitor treatment for MRSA infections. Test performance is not FDA approved in patients less than 50 years old. Performed at [facility], [address] [place], [place] [address]       RADIOGRAPHY: CT L-SPINE NO CHARGE  Result Date: 04/07/2023 CLINICAL DATA:  Pt bib [facility] from home, for recurrent falls and generalized weakness- pt was hospitalized and placed in rehab for this same issue and discharged back home 2 week EXAM: CT LUMBAR SPINE WITHOUT CONTRAST TECHNIQUE: Multidetector CT imaging of the lumbar spine was performed without intravenous contrast administration. Multiplanar CT image reconstructions were also generated. RADIATION DOSE REDUCTION: This exam was performed according to the departmental dose-optimization program which includes automated exposure control, adjustment of the mA and/or kV according to patient size and/or use of iterative reconstruction technique. COMPARISON:  CT abdomen pelvis 04/06/2023 FINDINGS: Segmentation: 5 lumbar type vertebrae. Alignment: Normal. Vertebrae: Diffusely decreased bone density. Multilevel moderate to severe degenerative changes of the spine. Posterior disc osteophyte complex formation at the L3-L4, L4-L5, L5-S1 levels. No acute fracture or focal pathologic process. Paraspinal and other soft tissues: Negative. Disc levels: Multilevel intervertebral disc space narrowing with fusion at the L2-L3 level and intervertebral disc space vacuum phenomenon at the T12-L1, L4-L5, L5-S1 levels. Other: Please see separately dictated CT abdomen pelvis 04/06/2023 IMPRESSION: 1. No acute displaced fracture or traumatic listhesis of the lumbar spine. 2. Diffusely decreased bone density. 3. Multilevel severe degenerative changes of the lumbar spine. Electronically Signed   By: Tish Frederickson M.D.   On: 04/07/2023 00:26   CT ABDOMEN PELVIS W CONTRAST  Result Date: 04/06/2023 CLINICAL  DATA:  Pancreatitis, acute, severe. Recurrent falls. Generalized weakness. EXAM: CT ABDOMEN AND PELVIS WITH CONTRAST TECHNIQUE: Multidetector CT imaging of the abdomen and pelvis was performed using the standard protocol following bolus administration of intravenous contrast. RADIATION DOSE REDUCTION: This exam was performed according to the departmental dose-optimization program which includes automated exposure control, adjustment of the mA and/or kV according to patient size and/or use of iterative reconstruction technique. CONTRAST:  OMNIPAQUE IOHEXOL 300 MG/ML  SOLN COMPARISON:  03/11/2023 FINDINGS: Lower chest: Bibasilar atelectasis or scarring, similar prior study. No effusions. Cardiomegaly. Hepatobiliary: Low-density adjacent to the falciform ligament, likely focal fatty infiltration. Vascular shunting/malformation again seen in the posterior right hepatic lobe, stable. No focal hepatic abnormality. Prior cholecystectomy. Slight intrahepatic and extrahepatic biliary ductal dilatation likely related to age and post cholecystectomy state. Pancreas: Edema/stranding around the pancreas is similar prior study suggesting acute pancreatitis. Spleen: No focal abnormality.  Normal size. Adrenals/Urinary Tract: 5 mm left midpole renal stone. No hydronephrosis bilaterally. No suspicious renal or adrenal mass. Urinary bladder is obscured by beam hardening artifact from bilateral hip replacements. Stomach/Bowel: Diffuse colonic diverticulosis, most pronounced in the left colon. Postoperative changes from gastric bypass. Small bowel decompressed. Vascular/Lymphatic: No evidence of aneurysm or adenopathy. Reproductive: Obscured by beam hardening artifact from bilateral hip replacements. Other: Trace free fluid in the cul-de-sac.  No free air. Musculoskeletal: No acute bony abnormality. IMPRESSION: Stranding/edema around the pancreas suggesting acute pancreatitis. This is similar prior study. Focal fatty infiltration  of the  liver adjacent to the falciform ligament. Prior gastric bypass.  No complicating feature. Left nephrolithiasis.  No hydronephrosis. Colonic diverticulosis. Electronically Signed   By: Charlett Nose M.D.   On: 04/06/2023 23:40   CT Head Wo Contrast  Result Date: 04/06/2023 CLINICAL DATA:  Mental status change, unknown cause EXAM: CT HEAD WITHOUT CONTRAST TECHNIQUE: Contiguous axial images were obtained from the base of the skull through the vertex without intravenous contrast. RADIATION DOSE REDUCTION: This exam was performed according to the departmental dose-optimization program which includes automated exposure control, adjustment of the mA and/or kV according to patient size and/or use of iterative reconstruction technique. COMPARISON:  MRI head 03/11/2023 FINDINGS: Brain: Patchy and confluent areas of decreased attenuation are noted throughout the deep and periventricular white matter of the cerebral hemispheres bilaterally, compatible with chronic microvascular ischemic disease. No evidence of large-territorial acute infarction. No parenchymal hemorrhage. No mass lesion. No extra-axial collection. No mass effect or midline shift. No hydrocephalus. Basilar cisterns are patent. Vascular: No hyperdense vessel. Atherosclerotic calcifications are present within the cavernous internal carotid arteries. Skull: No acute fracture or focal lesion. Sinuses/Orbits: Paranasal sinuses and mastoid air cells are clear. Bilateral lens replacement. Otherwise the orbits are unremarkable. Other: None. IMPRESSION: No acute intracranial abnormality. Electronically Signed   By: Tish Frederickson M.D.   On: 04/06/2023 23:32   DG Chest Port 1 View  Result Date: 04/06/2023 CLINICAL DATA:  weakness. EXAM: PORTABLE CHEST 1 VIEW COMPARISON:  03/11/2023. FINDINGS: Increased interstitial markings are essentially similar to the prior study. No acute consolidation or major lung collapse. Bilateral costophrenic angles are clear.  Stable cardio-mediastinal silhouette. Aortic arch calcifications noted. No acute osseous abnormalities. Note is made of bilateral shoulder arthroplasty hardware. The soft tissues are within normal limits. IMPRESSION: No acute cardiopulmonary process. Electronically Signed   By: Jules Schick M.D.   On: 04/06/2023 18:05         ASSESSMENT and PLAN:  1.  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  Free kappa light chains 1799.  Ratio 899.   -CTAP (04/06/2023): Stranding/edema around the pancreas suggesting acute pancreatitis.  Focal fatty infiltration of liver.  Prior gastric bypass.  Left nephrolithiasis. - CT L-spine (04/06/2023): Diffusely decreased bone density.  Multilevel degenerative changes of the lumbar spine.  No acute displaced fracture. - I have discussed the highly likely diagnosis of multiple myeloma with the patient in detail. - Recommend further blood tests with serum immunofixation, serum free light chains, LDH and beta-2 microglobulin. - Recommend bone marrow aspiration and biopsy as soon as possible. - Will check for cytogenetics and multiple myeloma FISH panel on the bone marrow aspirate. - When she is off of Levophed, will also recommend skeletal survey. - Will plan to do PET scan as an outpatient. - Discussed with Dr. Gwenlyn Perking.  2.  Hypercalcemia: - Calcium level today is 11.3 with albumin less than 1.5. - Recommend zoledronic acid 4 mg IV.  Continue aggressive hydration.  All questions were answered. The patient knows to call the clinic with any problems, questions or concerns. We can certainly see the patient much sooner if necessary.    Doreatha Massed

## 2023-04-07 NOTE — ED Notes (Signed)
ED TO INPATIENT HANDOFF REPORT  ED Nurse Name and Phone #: Jaquelyn Bitter [phone]  S Name/Age/Gender Kathy [name] 82 y.o. female Room/Bed: APA10/APA10  Code Status   Code Status: Prior  Home/SNF/Other Home Patient oriented to: self, place, time, and situation Is this baseline? 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 Pt daughter was at house when EMS arrived, but unknown whether daughter lives with pt. Pt alert and oriented x 4. Pt able to stand and uses walker to get around, although hasn't been walking today, feels to weak but able to stand with assistance.    Allergies Allergies  Allergen Reactions   Macrobid [Nitrofurantoin] Other (See Comments)    Stomach pain    Level of Care/Admitting Diagnosis ED Disposition     ED Disposition  Admit   Condition  --   Comment  Hospital Area: [facility] [[id number]]  Level of Care: Stepdown [14]  Covid Evaluation: Asymptomatic - no recent exposure (last 10 days) testing not required  Diagnosis: Hypercalcemia of malignancy [347425]  Admitting Physician: Frankey Shown [[id number]]  Attending Physician: Frankey Shown [[id number]]  Certification:: I certify this patient will need inpatient services for at least 2 midnights  Expected Medical Readiness: 04/10/2023          B Medical/Surgery History Past Medical History:  Diagnosis Date   Abnormal findings on diagnostic imaging of heart and coronary circulation    Anemia    FROM BLEEDING ULCER   Anxiety    takes Alprazolam daily as needed   Arthritis    dx with RA 2017   Bariatric surgery status    Cataract    Diverticulosis    Gastro-esophageal reflux disease with  esophagitis    Gastrojejunal ulcer with hemorrhage    Headache(784.0)    occasionally   History of blood transfusion    no abnormal reaction noted   History of bronchitis    last time many yrs ago   Hyperlipidemia    PT DENIES THIS DX -  ON NO MEDS AND NO ONE HAS TOLD HER   Hypertension    takes Amlodipine daily   Hypothyroidism    takes Synthroid daily   Insomnia    Joint pain    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   Localized edema    Nocturia    Numbness    occasionally left arm at night   Obesity    Osteoporosis    takes Fosamax weekly   Pain in joint involving pelvic region and thigh    Peripheral edema    takes Lasix daily as needed   PONV (postoperative nausea and vomiting)    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/2017   Rheumatoid arthritis, unspecified (HCC)    Rotator cuff arthropathy, 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   Sleep apnea    Phreesia 08/22/2020   Stomach ulcer    Thyroid disease    Phreesia 08/22/2020   Unspecified injury of muscle(s) and tendon(s) of the rotator cuff of left shoulder, subsequent encounter    Unspecified osteoarthritis, unspecified site    Wears glasses    Wears partial dentures    Past Surgical History:  Procedure Laterality Date   ABDOMINAL  HYSTERECTOMY     partial   cataract surgery Bilateral    CHOLECYSTECTOMY     COLONOSCOPY     ESOPHAGOGASTRODUODENOSCOPY (EGD) WITH PROPOFOL N/A 01/11/2022   Procedure: ESOPHAGOGASTRODUODENOSCOPY (EGD) WITH PROPOFOL;  Surgeon: Iva Boop, MD;  Location:  [facility] ENDOSCOPY;  Service: Gastroenterology;  Laterality: N/A;   EYE SURGERY     CATARACTS BOTH   gastric bypass surgery     HOT HEMOSTASIS N/A 01/11/2022   Procedure: HOT HEMOSTASIS (ARGON PLASMA COAGULATION/BICAP);  Surgeon: Iva Boop, MD;  Location: [facility] ENDOSCOPY;  Service: Gastroenterology;  Laterality: N/A;   JOINT REPLACEMENT Bilateral    hip   REVERSE SHOULDER ARTHROPLASTY Right 05/11/2016   Procedure: REVERSE  SHOULDER ARTHROPLASTY;  Surgeon: Teryl Lucy, MD;  Location: [facility] OR;  Service: Orthopedics;  Laterality: Right;   REVISION TOTAL HIP ARTHROPLASTY Left 10/03/2013   DR Turner Daniels   ROTATOR CUFF REPAIR     SCLEROTHERAPY  01/11/2022   Procedure: Susa Day;  Surgeon: Iva Boop, MD;  Location: [facility] ENDOSCOPY;  Service: Gastroenterology;;   TOTAL HIP REVISION Left 10/03/2013   Procedure: TOTAL HIP REVISION- left;  Surgeon: Nestor Lewandowsky, MD;  Location: [facility] OR;  Service: Orthopedics;  Laterality: Left;   TOTAL KNEE ARTHROPLASTY Left 11/29/2017   Procedure: LEFT TOTAL KNEE ARTHROPLASTY;  Surgeon: Teryl Lucy, MD;  Location: [facility] OR;  Service: Orthopedics;  Laterality: Left;   TOTAL SHOULDER ARTHROPLASTY Left 11/09/2016   Procedure: TOTAL REVERSE SHOULDER ARTHROPLASTY;  Surgeon: Teryl Lucy, MD;  Location: [facility] OR;  Service: Orthopedics;  Laterality: Left;   TOTAL SHOULDER REPLACEMENT Left 10/2016   TOTAL THYROIDECTOMY     UPPER GASTROINTESTINAL ENDOSCOPY       A IV Location/Drains/Wounds Patient Lines/Drains/Airways Status     Active Line/Drains/Airways     Name Placement date Placement time Site Days   Peripheral IV 04/06/23 22 G 1" Anterior;Right Forearm 04/06/23  1801  Forearm  1            Intake/Output Last 24 hours  Intake/Output Summary (Last 24 hours) at 04/07/2023 0501 Last data filed at 04/07/2023 0310 Gross per 24 hour  Intake 3200.38 ml  Output --  Net 3200.38 ml    Labs/Imaging Results for orders placed or performed during the hospital encounter of 04/06/23 (from the past 48 hour(s))  Urinalysis, Routine w reflex microscopic -Urine, Clean Catch     Status: Abnormal   Collection Time: 04/06/23  4:56 PM  Result Value Ref Range   Color, Urine YELLOW YELLOW   APPearance CLEAR CLEAR   Specific Gravity, Urine 1.017 1.005 - 1.030   pH 6.0 5.0 - 8.0   Glucose, UA NEGATIVE NEGATIVE mg/dL   Hgb urine dipstick NEGATIVE NEGATIVE   Bilirubin Urine NEGATIVE NEGATIVE    Ketones, ur NEGATIVE NEGATIVE mg/dL   Protein, ur 30 (A) NEGATIVE mg/dL   Nitrite NEGATIVE NEGATIVE   Leukocytes,Ua SMALL (A) NEGATIVE   RBC / HPF 0-5 0 - 5 RBC/hpf   WBC, UA 6-10 0 - 5 WBC/hpf   Bacteria, UA NONE SEEN NONE SEEN   Squamous Epithelial / HPF 0-5 0 - 5 /HPF   Hyaline Casts, UA PRESENT     Comment: Performed at [facility], [address] [place], [place] [address]  SARS Coronavirus 2 by RT PCR (hospital order, performed in [facility] hospital lab) *cepheid single result test* Anterior Nasal Swab     Status: None   Collection Time: 04/06/23  4:56 PM   Specimen: Anterior Nasal  Swab  Result Value Ref Range   SARS Coronavirus 2 by RT PCR NEGATIVE NEGATIVE    Comment: (NOTE) SARS-CoV-2 target nucleic acids are NOT DETECTED.  The SARS-CoV-2 RNA is generally detectable in upper and lower respiratory specimens during the acute phase of infection. The lowest concentration of SARS-CoV-2 viral copies this assay can detect is 250 copies / mL. A negative result does not preclude SARS-CoV-2 infection and should not be used as the sole basis for treatment or other patient management decisions.  A negative result may occur with improper specimen collection / handling, submission of specimen other than nasopharyngeal swab, presence of viral mutation(s) within the areas targeted by this assay, and inadequate number of viral copies (<250 copies / mL). A negative result must be combined with clinical observations, patient history, and epidemiological information.  Fact Sheet for Patients:   RoadLapTop.co.za  Fact Sheet for Healthcare Providers: http://kim-miller.com/  This test is not yet approved or  cleared by the [place] FDA and has been authorized for detection and/or diagnosis of SARS-CoV-2 by FDA under an Emergency Use Authorization (EUA).  This EUA will remain in effect (meaning this test can be used) for the duration of  the COVID-19 declaration under Section 564(b)(1) of the Act, 21 U.S.C. section 360bbb-3(b)(1), unless the authorization is terminated or revoked sooner.  Performed at [facility], [address] [place], [place] [address]   CBC with Differential     Status: Abnormal   Collection Time: 04/06/23  5:05 PM  Result Value Ref Range   WBC 6.5 4.0 - 10.5 K/uL   RBC 2.78 (L) 3.87 - 5.11 MIL/uL   Hemoglobin 9.2 (L) 12.0 - 15.0 g/dL   HCT 47.8 (L) 29.5 - 62.1 %   MCV 102.5 (H) 80.0 - 100.0 fL   MCH 33.1 26.0 - 34.0 pg   MCHC 32.3 30.0 - 36.0 g/dL   RDW 30.8 (H) 65.7 - 84.6 %   Platelets 190 150 - 400 K/uL   nRBC 0.9 (H) 0.0 - 0.2 %   Neutrophils Relative % 66 %   Neutro Abs 4.3 1.7 - 7.7 K/uL   Lymphocytes Relative 21 %   Lymphs Abs 1.3 0.7 - 4.0 K/uL   Monocytes Relative 12 %   Monocytes Absolute 0.8 0.1 - 1.0 K/uL   Eosinophils Relative 0 %   Eosinophils Absolute 0.0 0.0 - 0.5 K/uL   Basophils Relative 0 %   Basophils Absolute 0.0 0.0 - 0.1 K/uL   Immature Granulocytes 1 %   Abs Immature Granulocytes 0.03 0.00 - 0.07 K/uL    Comment: Performed at [facility], [address], [place], [place] [address]  Type and screen [facility]     Status: None   Collection Time: 04/06/23  5:05 PM  Result Value Ref Range   ABO/RH(D) AB POS    Antibody Screen NEG    Sample Expiration      04/09/2023,2359 Performed at [facility], [address] [place], [place] [address]   Troponin I (High Sensitivity)     Status: Abnormal   Collection Time: 04/06/23  5:50 PM  Result Value Ref Range   Troponin I (High Sensitivity) 416 (HH) <18 ng/L    Comment: CRITICAL RESULT CALLED TO, READ BACK BY AND VERIFIED WITH THOMPSON,R ON 04/06/23 AT 1945 BY LOY,C (NOTE) Elevated high sensitivity troponin I (hsTnI) values and significant  changes across serial measurements may suggest ACS but many other  chronic and acute conditions are known  to elevate hsTnI results.  Refer to the "Links" section for chest  pain algorithms and additional  guidance. Performed at [facility], [address] [place], [place] [address]   Lactic acid, plasma     Status: Abnormal   Collection Time: 04/06/23  5:50 PM  Result Value Ref Range   Lactic Acid, Venous 2.2 (HH) 0.5 - 1.9 mmol/L    Comment: CRITICAL RESULT CALLED TO, READ BACK BY AND VERIFIED WITH THOMPSON,R ON 04/06/23 AT 1945 BY LOY,C Performed at [facility], [address] [place], [place] [address]   Lactic acid, plasma     Status: Abnormal   Collection Time: 04/06/23  8:41 PM  Result Value Ref Range   Lactic Acid, Venous 2.0 (HH) 0.5 - 1.9 mmol/L    Comment: CRITICAL VALUE NOTED.  VALUE IS CONSISTENT WITH PREVIOUSLY REPORTED AND CALLED VALUE. Performed at [facility], [address] [place], [place] [address]   Troponin I (High Sensitivity)     Status: Abnormal   Collection Time: 04/06/23  8:41 PM  Result Value Ref Range   Troponin I (High Sensitivity) 315 (HH) <18 ng/L    Comment: CRITICAL RESULT CALLED TO, READ BACK BY AND VERIFIED WITH Adilynn Bessey,S ON 04/06/23 AT 2145 BY LOY,C (NOTE) Elevated high sensitivity troponin I (hsTnI) values and significant  changes across serial measurements may suggest ACS but many other  chronic and acute conditions are known to elevate hsTnI results.  Refer to the "Links" section for chest pain algorithms and additional  guidance. Performed at [facility], [address], [place], [place] [address]   Protime-INR     Status: Abnormal   Collection Time: 04/06/23  8:41 PM  Result Value Ref Range   Prothrombin Time 19.2 (H) 11.4 - 15.2 seconds   INR 1.6 (H) 0.8 - 1.2    Comment: (NOTE) INR goal varies based on device and disease states. Performed at [facility], [address] [place], [place] [address]   Comprehensive metabolic panel     Status: Abnormal   Collection Time: 04/06/23  8:41 PM  Result Value Ref Range   Sodium 136 135 - 145 mmol/L   Potassium 4.3 3.5 - 5.1 mmol/L   Chloride 107 98 - 111 mmol/L    CO2 20 (L) 22 - 32 mmol/L   Glucose, Bld 77 70 - 99 mg/dL    Comment: Glucose reference range applies only to samples taken after fasting for at least 8 hours.   BUN 26 (H) 8 - 23 mg/dL   Creatinine, Ser 6.96 (H) 0.44 - 1.00 mg/dL   Calcium 29.5 (H) 8.9 - 10.3 mg/dL   Total Protein 9.2 (H) 6.5 - 8.1 g/dL   Albumin <2.8 (L) 3.5 - 5.0 g/dL   AST 77 (H) 15 - 41 U/L   ALT 28 0 - 44 U/L   Alkaline Phosphatase 60 38 - 126 U/L   Total Bilirubin 0.5 0.3 - 1.2 mg/dL   GFR, Estimated 43 (L) >60 mL/min    Comment: (NOTE) Calculated using the CKD-EPI Creatinine Equation (2021)    Anion gap 9 5 - 15    Comment: Performed at [facility], [address] [place], [place] [address]  Lipase, blood     Status: Abnormal   Collection Time: 04/06/23  8:41 PM  Result Value Ref Range   Lipase 306 (H) 11 - 51 U/L    Comment: Performed at [facility], [address], [place], [place] [address]  Culture, blood (single)  Status: None (Preliminary result)   Collection Time: 04/07/23  1:02 AM   Specimen: BLOOD RIGHT HAND  Result Value Ref Range   Specimen Description BLOOD RIGHT HAND    Special Requests      BOTTLES DRAWN AEROBIC AND ANAEROBIC Blood Culture adequate volume Performed at [facility], [address] [place], [place] [address]    Culture PENDING    Report Status PENDING    CT L-SPINE NO CHARGE  Result Date: 04/07/2023 CLINICAL DATA:  Pt bib [facility] from home, for recurrent falls and generalized weakness- pt was hospitalized and placed in rehab for this same issue and discharged back home 2 week EXAM: CT LUMBAR SPINE WITHOUT CONTRAST TECHNIQUE: Multidetector CT imaging of the lumbar spine was performed without intravenous contrast administration. Multiplanar CT image reconstructions were also generated. RADIATION DOSE REDUCTION: This exam was performed according to the departmental dose-optimization program which includes automated exposure control, adjustment of the mA and/or kV according to  patient size and/or use of iterative reconstruction technique. COMPARISON:  CT abdomen pelvis 04/06/2023 FINDINGS: Segmentation: 5 lumbar type vertebrae. Alignment: Normal. Vertebrae: Diffusely decreased bone density. Multilevel moderate to severe degenerative changes of the spine. Posterior disc osteophyte complex formation at the L3-L4, L4-L5, L5-S1 levels. No acute fracture or focal pathologic process. Paraspinal and other soft tissues: Negative. Disc levels: Multilevel intervertebral disc space narrowing with fusion at the L2-L3 level and intervertebral disc space vacuum phenomenon at the T12-L1, L4-L5, L5-S1 levels. Other: Please see separately dictated CT abdomen pelvis 04/06/2023 IMPRESSION: 1. No acute displaced fracture or traumatic listhesis of the lumbar spine. 2. Diffusely decreased bone density. 3. Multilevel severe degenerative changes of the lumbar spine. Electronically Signed   By: Tish Frederickson M.D.   On: 04/07/2023 00:26   CT ABDOMEN PELVIS W CONTRAST  Result Date: 04/06/2023 CLINICAL DATA:  Pancreatitis, acute, severe. Recurrent falls. Generalized weakness. EXAM: CT ABDOMEN AND PELVIS WITH CONTRAST TECHNIQUE: Multidetector CT imaging of the abdomen and pelvis was performed using the standard protocol following bolus administration of intravenous contrast. RADIATION DOSE REDUCTION: This exam was performed according to the departmental dose-optimization program which includes automated exposure control, adjustment of the mA and/or kV according to patient size and/or use of iterative reconstruction technique. CONTRAST:  OMNIPAQUE IOHEXOL 300 MG/ML  SOLN COMPARISON:  03/11/2023 FINDINGS: Lower chest: Bibasilar atelectasis or scarring, similar prior study. No effusions. Cardiomegaly. Hepatobiliary: Low-density adjacent to the falciform ligament, likely focal fatty infiltration. Vascular shunting/malformation again seen in the posterior right hepatic lobe, stable. No focal hepatic  abnormality. Prior cholecystectomy. Slight intrahepatic and extrahepatic biliary ductal dilatation likely related to age and post cholecystectomy state. Pancreas: Edema/stranding around the pancreas is similar prior study suggesting acute pancreatitis. Spleen: No focal abnormality.  Normal size. Adrenals/Urinary Tract: 5 mm left midpole renal stone. No hydronephrosis bilaterally. No suspicious renal or adrenal mass. Urinary bladder is obscured by beam hardening artifact from bilateral hip replacements. Stomach/Bowel: Diffuse colonic diverticulosis, most pronounced in the left colon. Postoperative changes from gastric bypass. Small bowel decompressed. Vascular/Lymphatic: No evidence of aneurysm or adenopathy. Reproductive: Obscured by beam hardening artifact from bilateral hip replacements. Other: Trace free fluid in the cul-de-sac.  No free air. Musculoskeletal: No acute bony abnormality. IMPRESSION: Stranding/edema around the pancreas suggesting acute pancreatitis. This is similar prior study. Focal fatty infiltration of the liver adjacent to the falciform ligament. Prior gastric bypass.  No complicating feature. Left nephrolithiasis.  No hydronephrosis. Colonic diverticulosis. Electronically Signed   By: Charlett Nose M.D.  On: 04/06/2023 23:40   CT Head Wo Contrast  Result Date: 04/06/2023 CLINICAL DATA:  Mental status change, unknown cause EXAM: CT HEAD WITHOUT CONTRAST TECHNIQUE: Contiguous axial images were obtained from the base of the skull through the vertex without intravenous contrast. RADIATION DOSE REDUCTION: This exam was performed according to the departmental dose-optimization program which includes automated exposure control, adjustment of the mA and/or kV according to patient size and/or use of iterative reconstruction technique. COMPARISON:  MRI head 03/11/2023 FINDINGS: Brain: Patchy and confluent areas of decreased attenuation are noted throughout the deep and periventricular white matter of  the cerebral hemispheres bilaterally, compatible with chronic microvascular ischemic disease. No evidence of large-territorial acute infarction. No parenchymal hemorrhage. No mass lesion. No extra-axial collection. No mass effect or midline shift. No hydrocephalus. Basilar cisterns are patent. Vascular: No hyperdense vessel. Atherosclerotic calcifications are present within the cavernous internal carotid arteries. Skull: No acute fracture or focal lesion. Sinuses/Orbits: Paranasal sinuses and mastoid air cells are clear. Bilateral lens replacement. Otherwise the orbits are unremarkable. Other: None. IMPRESSION: No acute intracranial abnormality. Electronically Signed   By: Tish Frederickson M.D.   On: 04/06/2023 23:32   DG Chest Port 1 View  Result Date: 04/06/2023 CLINICAL DATA:  weakness. EXAM: PORTABLE CHEST 1 VIEW COMPARISON:  03/11/2023. FINDINGS: Increased interstitial markings are essentially similar to the prior study. No acute consolidation or major lung collapse. Bilateral costophrenic angles are clear. Stable cardio-mediastinal silhouette. Aortic arch calcifications noted. No acute osseous abnormalities. Note is made of bilateral shoulder arthroplasty hardware. The soft tissues are within normal limits. IMPRESSION: No acute cardiopulmonary process. Electronically Signed   By: Jules Schick M.D.   On: 04/06/2023 18:05    Pending Labs Unresulted Labs (From admission, onward)    None       Vitals/Pain Today's Vitals   04/07/23 0230 04/07/23 0300 04/07/23 0330 04/07/23 0415  BP: (!) 85/63 (!) 77/50 (!) 87/59 98/66  Pulse: 69 68 69 75  Resp: 17 11 15 19   Temp:      TempSrc:      SpO2: 99% 95% 96% 97%  Weight:      Height:      PainSc:        Isolation Precautions No active isolations  Medications Medications  lactated ringers infusion ( Intravenous New Bag/Given 04/07/23 0311)  sodium chloride 0.9 % bolus 1,000 mL (0 mLs Intravenous Stopped 04/06/23 2158)  oxyCODONE-acetaminophen  (PERCOCET/ROXICET) 5-325 MG per tablet 1 tablet (1 tablet Oral Given 04/06/23 1835)  sodium chloride 0.9 % bolus 500 mL (0 mLs Intravenous Stopped 04/06/23 2218)  iohexol (OMNIPAQUE) 300 MG/ML solution 100 mL (100 mLs Intravenous Contrast Given 04/06/23 2306)  sodium chloride 0.9 % bolus 500 mL (0 mLs Intravenous Stopped 04/06/23 2254)  lactated ringers bolus 1,000 mL (0 mLs Intravenous Stopped 04/07/23 0310)  ceFEPIme (MAXIPIME) 2 g in sodium chloride 0.9 % 100 mL IVPB (0 g Intravenous Stopped 04/07/23 0206)  metroNIDAZOLE (FLAGYL) IVPB 500 mg (0 mg Intravenous Stopped 04/07/23 0309)  lactated ringers bolus 1,000 mL (1,000 mLs Intravenous New Bag/Given 04/07/23 0500)    Mobility walks with device     Focused Assessments See provider note   R Recommendations: See Admitting Provider Note  Report given to:   Additional Notes: Pt A&Ox4, purewick in place

## 2023-04-07 NOTE — ED Notes (Signed)
Attempted to call report to ICU, RN unavailable for report at this time.  Number given for receiving RN to call back.

## 2023-04-07 NOTE — Plan of Care (Signed)
  Problem: Acute Rehab OT Goals (only OT should resolve) Goal: Pt. Will Perform Upper Body Dressing Flowsheets (Taken 04/07/2023 1420) Pt Will Perform Upper Body Dressing:  with modified independence  sitting Goal: Pt. Will Perform Lower Body Dressing Flowsheets (Taken 04/07/2023 1420) Pt Will Perform Lower Body Dressing:  with min assist  sitting/lateral leans  sit to/from stand Goal: Pt. Will Transfer To Toilet Flowsheets (Taken 04/07/2023 1420) Pt Will Transfer to Toilet:  with min assist  stand pivot transfer  ambulating  regular height toilet  bedside commode Goal: Pt. Will Perform Toileting-Clothing Manipulation Flowsheets (Taken 04/07/2023 1420) Pt Will Perform Toileting - Clothing Manipulation and hygiene:  with contact guard assist  sitting/lateral leans  sit to/from stand Goal: Pt/Caregiver Will Perform Home Exercise Program Flowsheets (Taken 04/07/2023 1420) Pt/caregiver will Perform Home Exercise Program:  Increased strength  Both right and left upper extremity  Independently  With written HEP provided

## 2023-04-07 NOTE — Plan of Care (Signed)
  Problem: Acute Rehab PT Goals(only PT should resolve) Goal: Pt Will Go Supine/Side To Sit Outcome: Progressing Flowsheets (Taken 04/07/2023 1354) Pt will go Supine/Side to Sit:  with moderate assist  with minimal assist Goal: Patient Will Transfer Sit To/From Stand Outcome: Progressing Flowsheets (Taken 04/07/2023 1354) Patient will transfer sit to/from stand:  with minimal assist  with moderate assist Goal: Pt Will Transfer Bed To Chair/Chair To Bed Outcome: Progressing Flowsheets (Taken 04/07/2023 1354) Pt will Transfer Bed to Chair/Chair to Bed:  with min assist  with mod assist Goal: Pt Will Ambulate Outcome: Progressing Flowsheets (Taken 04/07/2023 1354) Pt will Ambulate:  25 feet  with moderate assist  with rolling walker   1:55 PM, 04/07/23 Kathy [name], MPT Physical Therapist with [facility] [phone] office [phone] mobile phone

## 2023-04-07 NOTE — Evaluation (Signed)
Physical Therapy Evaluation Patient Details Name: Kathy [name] MRN: [id number] DOB: [birth year] Today's Date: 04/07/2023  History of Present Illness  Kathy [name] is a 82 y.o. 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  Patient states she slid out of bed and fell on the floor, but was unable to get up.  A friend of hers who was unable to reach her on the phone stopped by to check on her and saw her on the floor, EMS was activated and patient was sent to the ED for further evaluation and management.  Patient endorsed generalized weakness that has been ongoing for a few days, she has been having difficulty in ambulation due to weakness.  Patient ambulates with a walker at baseline and she lives alone.  Patient was recently admitted from 8/9 to 03/16/2023 due to persistent hypercalcemia, generalized weakness and deconditioning with history of falls, she was evaluated by PT and SNF rehab was recommended.  She was also admitted from 6/23 to 01/31/2023 due to hypercalcemia   Clinical Impression  Patient demonstrates slow labored movement for sitting up at bedside, once seated had poor tolerance for leaning forward due to increasing low back pain, had most difficulty completing sit to stands due to BLE weakness, limited left knee flexion and able to complete a few side steps before having to sit due to fall risk.  Patient tolerated sitting up in chair after therapy - RN aware.  Patient will benefit from continued skilled physical therapy in hospital and recommended venue below to increase strength, balance, endurance for safe ADLs and gait.           If plan is discharge home, recommend the following: A lot of help with bathing/dressing/bathroom;A lot of help with walking and/or transfers;Help with stairs or ramp for  entrance;Assistance with cooking/housework   Can travel by private vehicle   No    Equipment Recommendations None recommended by PT  Recommendations for Other Services       Functional Status Assessment Patient has had a recent decline in their functional status and demonstrates the ability to make significant improvements in function in a reasonable and predictable amount of time.     Precautions / Restrictions Precautions Precautions: Fall Restrictions Weight Bearing Restrictions: No      Mobility  Bed Mobility Overal bed mobility: Needs Assistance Bed Mobility: Supine to Sit Rolling: Mod assist, Max assist         General bed mobility comments: increased time, poor tolerance for flexing trunk due to low back pain    Transfers Overall transfer level: Needs assistance Equipment used: Rolling walker (2 wheels) Transfers: Sit to/from Stand, Bed to chair/wheelchair/BSC Sit to Stand: Mod assist, Max assist   Step pivot transfers: Mod assist       General transfer comment: had most difficulty completing sit to stands due to BLE weakness and left knee frozen in extension    Ambulation/Gait Ambulation/Gait assistance: Mod assist, Max assist Gait Distance (Feet): 3 Feet Assistive device: Rolling walker (2 wheels) Gait Pattern/deviations: Decreased step length - right, Decreased step length - left, Decreased stance time - right, Decreased stride length, Shuffle Gait velocity: slow     General Gait Details: limited to a few unsteady labored side steps before having to sit due to weakness, fall risk  Stairs  Wheelchair Mobility     Tilt Bed    Modified Rankin (Stroke Patients Only)       Balance Overall balance assessment: Needs assistance Sitting-balance support: Feet supported, No upper extremity supported Sitting balance-Leahy Scale: Poor Sitting balance - Comments: fair/poor seated at EOB with frequent posterior leaning Postural  control: Posterior lean Standing balance support: Reliant on assistive device for balance, During functional activity, Bilateral upper extremity supported Standing balance-Leahy Scale: Poor Standing balance comment: using RW                             Pertinent Vitals/Pain Pain Assessment Pain Assessment: Faces Faces Pain Scale: Hurts little more Pain Location: low back Pain Intervention(s): Limited activity within patient's tolerance, Monitored during session, Repositioned    Home Living Family/patient expects to be discharged to:: Private residence Living Arrangements: Alone Available Help at Discharge: Family;Available PRN/intermittently Type of Home: House Home Access: Stairs to enter Entrance Stairs-Rails: Right;Can reach both Entrance Stairs-Number of Steps: 4   Home Layout: One level Home Equipment: Agricultural consultant (2 wheels);Cane - single point;BSC/3in1;Tub bench;Grab bars - tub/shower      Prior Function Prior Level of Function : Independent/Modified Independent;Driving             Mobility Comments: Pt reports SPC for household ambulation. RW for community ambulation ADLs Comments: Assisted by family     Extremity/Trunk Assessment   Upper Extremity Assessment Upper Extremity Assessment: Defer to OT evaluation    Lower Extremity Assessment Lower Extremity Assessment: Generalized weakness;LLE deficits/detail LLE Deficits / Details: grossly -3/5, unable to flex left knee due to contracture LLE: Unable to fully assess due to immobilization LLE Sensation: WNL LLE Coordination: WNL    Cervical / Trunk Assessment Cervical / Trunk Assessment: Kyphotic  Communication   Communication Communication: No apparent difficulties  Cognition Arousal: Alert Behavior During Therapy: WFL for tasks assessed/performed Overall Cognitive Status: Within Functional Limits for tasks assessed                                          General  Comments      Exercises     Assessment/Plan    PT Assessment Patient needs continued PT services  PT Problem List Decreased strength;Decreased range of motion;Decreased activity tolerance;Decreased balance;Decreased mobility;Pain       PT Treatment Interventions DME instruction;Gait training;Stair training;Functional mobility training;Therapeutic activities;Therapeutic exercise;Balance training;Patient/family education    PT Goals (Current goals can be found in the Care Plan section)  Acute Rehab PT Goals Patient Stated Goal: return home after rehab PT Goal Formulation: With patient Time For Goal Achievement: 04/21/23 Potential to Achieve Goals: Good    Frequency Min 3X/week     Co-evaluation               AM-PAC PT "6 Clicks" Mobility  Outcome Measure Help needed turning from your back to your side while in a flat bed without using bedrails?: A Lot Help needed moving from lying on your back to sitting on the side of a flat bed without using bedrails?: A Lot Help needed moving to and from a bed to a chair (including a wheelchair)?: A Lot Help needed standing up from a chair using your arms (e.g., wheelchair or bedside chair)?: A Lot Help needed to walk in hospital room?: A Lot Help needed climbing 3-5 steps  with a railing? : Total 6 Click Score: 11    End of Session   Activity Tolerance: Patient tolerated treatment well;Patient limited by fatigue Patient left: in chair;with call bell/phone within reach Nurse Communication: Mobility status PT Visit Diagnosis: Other abnormalities of gait and mobility (R26.89);Muscle weakness (generalized) (M62.81)    Time: 3664-4034 PT Time Calculation (min) (ACUTE ONLY): 25 min   Charges:   PT Evaluation $PT Eval Moderate Complexity: 1 Mod PT Treatments $Therapeutic Activity: 23-37 mins PT General Charges $$ ACUTE PT VISIT: 1 Visit         1:53 PM, 04/07/23 [name], MPT Physical Therapist with [facility] 336 [phone] office [phone] mobile phone

## 2023-04-07 NOTE — TOC Initial Note (Signed)
Transition of Care [facility]) - Initial/Assessment Note    Patient Details  Name: Kathy [name] MRN: [id number] Date of Birth: 11/18/1940  [facility] CM/SW Contact:    Annice Needy, LCSW Phone Number: 04/07/2023, 10:18 AM  Clinical Narrative:                 Patient from home alone. Admitted with Hypercalcemia of malignancy. Recently in rehab at [facility] for 7 days. Also, in Rehab at [facility] for 7 days in July. Active with HH (unsure of which agency). Patient was d/c from [facility] in [place]. Has cane and walker in the home. Had [facility] that is broken now due to a previous fall. TOC will follow and address disposition needs as they arise.   Expected Discharge Plan: Home w Home Health Services Barriers to Discharge: Continued Medical Work up   Patient Goals and CMS Choice            Expected Discharge Plan and Services       Living arrangements for the past 2 months: Single Family Home                                      Prior Living Arrangements/Services Living arrangements for the past 2 months: Single Family Home Lives with:: Self Patient language and need for interpreter reviewed:: Yes        Need for Family Participation in Patient Care: Yes (Comment) Care giver support system in place?: Yes (comment)   Criminal Activity/Legal Involvement Pertinent to Current Situation/Hospitalization: No - Comment as needed  Activities of Daily Living Home Assistive Devices/Equipment: Cane (specify quad or straight), Walker (specify type) ADL Screening (condition at time of admission) Patient's cognitive ability adequate to safely complete daily activities?: Yes Is the patient deaf or have difficulty hearing?: No Does the patient have difficulty seeing, even when wearing glasses/contacts?: No Does the patient have difficulty concentrating, remembering, or making decisions?: No Patient able to express need for assistance with ADLs?: Yes Does the  patient have difficulty dressing or bathing?: No Independently performs ADLs?: Yes (appropriate for developmental age) Does the patient have difficulty walking or climbing stairs?: Yes Weakness of Legs: Both Weakness of Arms/Hands: Both  Permission Sought/Granted Permission sought to share information with : Family Supports    Share Information with NAME: Verlon Setting, cousin           Emotional Assessment     Affect (typically observed): Appropriate Orientation: : Oriented to Self, Oriented to Place, Oriented to  Time, Oriented to Situation Alcohol / Substance Use: Not Applicable Psych Involvement: No (comment)  Admission diagnosis:  Hypercalcemia of malignancy [E83.52] Patient Active Problem List   Diagnosis Date Noted   Hypercalcemia of malignancy 04/07/2023   Generalized weakness 04/07/2023   Recurrent falls 04/07/2023   Hypotension 04/07/2023   Hypoalbuminemia due to protein-calorie malnutrition (HCC) 04/07/2023   Acute prostatitis 04/07/2023   Chronic kidney disease, stage 3b (HCC) 04/07/2023   Dysphagia 04/07/2023   Acute pancreatitis 04/07/2023   History of thyroid cancer 04/07/2023   Microcytic anemia 03/30/2023   Physical deconditioning 03/30/2023   Elevated serum immunoglobulin free light chain level 03/30/2023   Leg swelling 02/21/2023   Hospital discharge follow-up 02/21/2023   Hypomagnesemia 02/21/2023   Urge incontinence of urine 02/21/2023   Hypercalcemia 01/23/2023   AKI (acute kidney injury) (HCC) 01/23/2023   Sensorineural hearing loss (SNHL)  of both ears 10/06/2022   Hammer toe of left foot 09/15/2022   Left foot pain 09/15/2022   Iron deficiency anemia due to chronic blood loss 07/19/2022   Rectal bleeding 01/21/2022   Gastrojejunal ulcer with hemorrhage    Encounter for general adult medical examination with abnormal findings 09/16/2021   Hypokalemia 09/16/2021   RBBB (right bundle branch block with left anterior fascicular block)  03/16/2021   Bilateral impacted cerumen 11/19/2020   Hearing loss 11/19/2020   Malignant tumor of thyroid gland (HCC) 10/08/2020   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   Polyarthritis    Bariatric surgery status    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/2017   Rheumatoid arthritis (HCC) 11/16/2016   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   Rotator cuff arthropathy, 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   S/P shoulder replacement 05/11/2016   Allergic rhinitis 10/19/2013   GERD (gastroesophageal reflux disease) 10/19/2013   Osteoporosis    Essential hypertension    Acquired hypothyroidism    Hyperlipidemia    Anxiety    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   Unspecified protein-calorie malnutrition (HCC) 10/14/2011   Vitamin deficiency 10/14/2011   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   PCP:  [name], MD Pharmacy:   [facility] [place], [place] - [address] [place] [address] Phone: [phone] Fax: [phone]     Social Determinants of Health (SDOH) Social History: SDOH Screenings   Food Insecurity: No Food Insecurity (04/07/2023)  Housing: Low Risk  (04/07/2023)  Transportation Needs: No Transportation Needs (04/07/2023)  Utilities: Not At Risk (04/07/2023)  Alcohol Screen: Low Risk  (09/14/2021)  Depression (PHQ2-9): Low Risk  (03/30/2023)  Financial Resource Strain: Low Risk  (09/14/2021)  Physical Activity: Sufficiently Active (09/14/2021)  Social Connections: Moderately Isolated (09/14/2021)  Stress: No Stress Concern Present (09/14/2021)  Tobacco Use: Low Risk  (04/06/2023)   SDOH Interventions:     Readmission Risk Interventions    03/16/2023   10:32 AM  Readmission Risk Prevention Plan  Transportation Screening Complete  PCP or Specialist Appt within 3-5 Days Complete  HRI or Home Care Consult Complete  Social Work Consult for Recovery Care Planning/Counseling Complete  Palliative Care Screening Not  Applicable  Medication Review Oceanographer) Complete

## 2023-04-07 NOTE — Plan of Care (Signed)

## 2023-04-07 NOTE — Progress Notes (Signed)
PET scan order placed per Dr. Katragadda. 

## 2023-04-07 NOTE — ED Notes (Signed)
Pt rolled and cleaned, briefs changed, purewick in place.

## 2023-04-07 NOTE — H&P (Addendum)
History and Physical    Patient: Kathy [name] YNW:[id number] DOB: 1941/03/08 DOA: 04/06/2023 DOS: the patient was seen and examined on 04/07/2023 PCP: [name], MD  Patient coming from: Home  Chief Complaint:  Chief Complaint  Patient presents with   Fall   HPI: Kathy [name] is a 82 y.o. 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  Patient states she slid out of bed and fell on the floor, but was unable to get up.  A friend of hers who was unable to reach her on the phone stopped by to check on her and saw her on the floor, EMS was activated and patient was sent to the ED for further evaluation and management.  Patient endorsed generalized weakness that has been ongoing for a few days, she has been having difficulty in ambulation due to weakness.  Patient ambulates with a walker at baseline and she lives alone. Patient was recently admitted from 8/9 to 03/16/2023 due to persistent hypercalcemia, generalized weakness and deconditioning with history of falls, she was evaluated by PT and SNF rehab was recommended. She was also admitted from 6/23 to 01/31/2023 due to hypercalcemia  ED Course:  In the emergency department, BP on arrival was 96/61, temperature was 97.5, respiration 22/min, pulse 76 bpm, O2 sat 98% on room air.  Workup in the ED showed macrocytic anemia.  BMP was normal except for bicarb of 20, BUN 26/1.27 (baseline at 1.1-1.3).  Albumin < 1.5, troponin 416 > 315, lactic acid 2.2 > 2.0, lipase 306, urinalysis was normal.  SARS coronavirus 2 was negative, blood culture pending CT abdomen pelvis showed stranding/edema around the pancreas suggesting acute pancreatitis. CT head without contrast showed no acute ventricular abnormality Chest x-ray showed no acute cardiopulmonary process CT  lumbar spine without contrast showed no acute displaced fracture or traumatic listhesis of the lumbar spine. IV cefepime and metronidazole were given due to initial thoughts of intra-abdominal infection.  IV hydration was provided.  Percocet 5/325 mg x 1 was given.  IV Levophed was started due to hypotension despite IV hydration. Hospitalist was asked to admit patient for further evaluation and management.  Review of Systems: Review of systems as noted in the HPI. All other systems reviewed and are negative.   Past Medical History:  Diagnosis Date   Abnormal findings on diagnostic imaging of heart and coronary circulation    Anemia    FROM BLEEDING ULCER   Anxiety    takes Alprazolam daily as needed   Arthritis    dx with RA 2017   Bariatric surgery status    Cataract    Diverticulosis    Gastro-esophageal reflux disease with esophagitis    Gastrojejunal ulcer with hemorrhage    Headache(784.0)    occasionally   History of blood transfusion    no abnormal reaction noted   History of bronchitis    last time many yrs ago   Hyperlipidemia    PT DENIES THIS DX -  ON NO MEDS AND NO ONE HAS TOLD HER   Hypertension    takes Amlodipine daily   Hypothyroidism    takes Synthroid daily   Insomnia    Joint pain    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   Localized edema    Nocturia    Numbness    occasionally left  arm at night   Obesity    Osteoporosis    takes Fosamax weekly   Pain in joint involving pelvic region and thigh    Peripheral edema    takes Lasix daily as needed   PONV (postoperative nausea and vomiting)    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/2017   Rheumatoid arthritis, unspecified (HCC)    Rotator cuff arthropathy, 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   Sleep apnea    Phreesia 08/22/2020   Stomach ulcer    Thyroid disease    Phreesia 08/22/2020   Unspecified injury of muscle(s) and tendon(s) of the rotator cuff of left shoulder, subsequent encounter     Unspecified osteoarthritis, unspecified site    Wears glasses    Wears partial dentures    Past Surgical History:  Procedure Laterality Date   ABDOMINAL HYSTERECTOMY     partial   cataract surgery Bilateral    CHOLECYSTECTOMY     COLONOSCOPY     ESOPHAGOGASTRODUODENOSCOPY (EGD) WITH PROPOFOL N/A 01/11/2022   Procedure: ESOPHAGOGASTRODUODENOSCOPY (EGD) WITH PROPOFOL;  Surgeon: Iva Boop, MD;  Location: [facility] ENDOSCOPY;  Service: Gastroenterology;  Laterality: N/A;   EYE SURGERY     CATARACTS BOTH   gastric bypass surgery     HOT HEMOSTASIS N/A 01/11/2022   Procedure: HOT HEMOSTASIS (ARGON PLASMA COAGULATION/BICAP);  Surgeon: Iva Boop, MD;  Location: [facility] ENDOSCOPY;  Service: Gastroenterology;  Laterality: N/A;   JOINT REPLACEMENT Bilateral    hip   REVERSE SHOULDER ARTHROPLASTY Right 05/11/2016   Procedure: REVERSE SHOULDER ARTHROPLASTY;  Surgeon: Teryl Lucy, MD;  Location: [facility] OR;  Service: Orthopedics;  Laterality: Right;   REVISION TOTAL HIP ARTHROPLASTY Left 10/03/2013   DR Turner Daniels   ROTATOR CUFF REPAIR     SCLEROTHERAPY  01/11/2022   Procedure: Susa Day;  Surgeon: Iva Boop, MD;  Location: [facility] ENDOSCOPY;  Service: Gastroenterology;;   TOTAL HIP REVISION Left 10/03/2013   Procedure: TOTAL HIP REVISION- left;  Surgeon: Nestor Lewandowsky, MD;  Location: [facility] OR;  Service: Orthopedics;  Laterality: Left;   TOTAL KNEE ARTHROPLASTY Left 11/29/2017   Procedure: LEFT TOTAL KNEE ARTHROPLASTY;  Surgeon: Teryl Lucy, MD;  Location: [facility] OR;  Service: Orthopedics;  Laterality: Left;   TOTAL SHOULDER ARTHROPLASTY Left 11/09/2016   Procedure: TOTAL REVERSE SHOULDER ARTHROPLASTY;  Surgeon: Teryl Lucy, MD;  Location: [facility] OR;  Service: Orthopedics;  Laterality: Left;   TOTAL SHOULDER REPLACEMENT Left 10/2016   TOTAL THYROIDECTOMY     UPPER GASTROINTESTINAL ENDOSCOPY      Social History:  reports that she has never smoked. She has never been exposed to tobacco smoke. She has  never used smokeless tobacco. She reports that she does not drink alcohol and does not use drugs.   Allergies  Allergen Reactions   Macrobid [Nitrofurantoin] Other (See Comments)    Stomach pain    Family History  Problem Relation Age of Onset   Diabetes Mother    Congestive Heart Failure Mother    Lung cancer Father    Thrombosis Sister    CAD Brother        CABG   Colon cancer Neg Hx    Esophageal cancer Neg Hx    Stomach cancer Neg Hx    Rectal cancer Neg Hx      Prior to Admission medications   Medication Sig Start Date End Date Taking? Authorizing Provider  acetaminophen (TYLENOL) 500 MG tablet Take 1 tablet (500 mg total) by mouth every 6 (six) hours as needed for  mild pain, headache or fever (or Fever >/= 101). 03/16/23   Shon Hale, MD  Adalimumab (HUMIRA PEN) 40 MG/0.4ML PNKT Inject 40 mg into the skin every Saturday.    [provider]  ALPRAZolam Prudy Feeler) 0.25 MG tablet Take 1 tablet (0.25 mg total) by mouth 2 (two) times daily as needed for anxiety or sleep. TAKE 1 TABLET BY MOUTH TWICE DAILY AS NEEDED FOR ANXIETY OR SLEEP Strength: 0.25 mg 03/16/23   Shon Hale, MD  denosumab (PROLIA) 60 MG/ML SOSY injection inject 60mg  Subcutaneous every 6 months 180 days 10/07/22     denosumab (PROLIA) 60 MG/ML SOSY injection inject 60mg  Subcutaneous every 6 months 180 days 04/05/23     denosumab (PROLIA) 60 MG/ML SOSY injection 60mg  Subcutaneous every 6 months 180 days 04/06/23     diclofenac Sodium (VOLTAREN) 1 % GEL APPLY (1) GRAM TO AFFECTED AREA DAILY. Patient taking differently: Apply 1 g topically daily. 08/11/22   [name], MD  ferrous sulfate 325 (65 FE) MG EC tablet Take 1 tablet (325 mg total) by mouth daily. 03/16/23   Shon Hale, MD  furosemide (LASIX) 20 MG tablet Take 1 tablet (20 mg total) by mouth 2 (two) times daily. Patient taking differently: Take 20 mg by mouth daily as needed for fluid. 03/16/22   [name], MD  levothyroxine  (SYNTHROID) 50 MCG tablet Take 1 tablet (50 mcg total) by mouth daily. 03/16/23 03/15/24  Shon Hale, MD  Multiple Vitamin (MULTIVITAMIN WITH MINERALS) TABS tablet Take 1 tablet by mouth every morning.    [provider]  naloxone [facility]) nasal spray 4 mg/0.1 mL Place 1 spray into the nose as needed (opioid reversal).    [provider]  Omega-3 Fatty Acids (FISH OIL PO) Take 1 capsule by mouth daily.    [provider]  omeprazole (PRILOSEC) 40 MG capsule Take 1 capsule (40 mg total) by mouth daily before breakfast. Open capsule and swallow granules with liquid or applesauce 04/15/22   Iva Boop, MD  oxybutynin (DITROPAN XL) 10 MG 24 hr tablet Take 1 tablet (10 mg total) by mouth at bedtime. 02/21/23   [name], MD  oxyCODONE-acetaminophen (PERCOCET/ROXICET) 5-325 MG tablet Take 1 tablet by mouth every 8 (eight) hours as needed for moderate pain or severe pain (pain not relieved by tramadol). 03/16/23   Shon Hale, MD  potassium chloride SA (KLOR-CON M) 20 MEQ tablet Take 1 tablet (20 mEq total) by mouth daily. TAKE 1 TABLET (20 MEQ) BY MOUTH DAILY PRN with Lasix (EDEMA) 03/16/23   Shon Hale, MD  traMADol (ULTRAM) 50 MG tablet Take 1 tablet (50 mg total) by mouth every 12 (twelve) hours as needed for moderate pain. 03/16/23   Shon Hale, MD    Physical Exam: BP (!) 100/59   Pulse 65   Temp 97.7 F (36.5 C) (Oral)   Resp 13   Ht 5\' 1"  (1.549 m)   Wt 68 kg   SpO2 95%   BMI 28.33 kg/m   General: 82 y.o. year-old female ill appearing, but in no acute distress.  Alert and oriented x3. HEENT: NCAT, EOMI, dry mucous membrane Neck: Supple, trachea medial Cardiovascular: Regular rate and rhythm with no rubs or gallops.  No thyromegaly or JVD noted.  No lower extremity edema. 2/4 pulses in all 4 extremities. Respiratory: Clear to auscultation with no wheezes or rales. Good inspiratory effort. Abdomen: Soft, nontender nondistended with  normal bowel sounds x4 quadrants. Muskuloskeletal: No cyanosis,  clubbing or edema noted bilaterally Neuro: CN II-XII intact, sensation, reflexes intact Skin: No ulcerative lesions noted or rashes Psychiatry: Judgement and insight appear normal. Mood is appropriate for condition and setting          Labs on Admission:  Basic Metabolic Panel: Recent Labs  Lab 04/06/23 2041  NA 136  K 4.3  CL 107  CO2 20*  GLUCOSE 77  BUN 26*  CREATININE 1.27*  CALCIUM 12.4*   Liver Function Tests: Recent Labs  Lab 04/06/23 2041  AST 77*  ALT 28  ALKPHOS 60  BILITOT 0.5  PROT 9.2*  ALBUMIN <1.5*   Recent Labs  Lab 04/06/23 2041  LIPASE 306*   No results for input(s): "AMMONIA" in the last 168 hours. CBC: Recent Labs  Lab 04/06/23 1705  WBC 6.5  NEUTROABS 4.3  HGB 9.2*  HCT 28.5*  MCV 102.5*  PLT 190   Cardiac Enzymes: No results for input(s): "CKTOTAL", "CKMB", "CKMBINDEX", "TROPONINI" in the last 168 hours.  BNP (last 3 results) Recent Labs    01/23/23 1234  BNP 848.0*    ProBNP (last 3 results) No results for input(s): "PROBNP" in the last 8760 hours.  CBG: No results for input(s): "GLUCAP" in the last 168 hours.  Radiological Exams on Admission: CT L-SPINE NO CHARGE  Result Date: 04/07/2023 CLINICAL DATA:  Pt bib [facility] from home, for recurrent falls and generalized weakness- pt was hospitalized and placed in rehab for this same issue and discharged back home 2 week EXAM: CT LUMBAR SPINE WITHOUT CONTRAST TECHNIQUE: Multidetector CT imaging of the lumbar spine was performed without intravenous contrast administration. Multiplanar CT image reconstructions were also generated. RADIATION DOSE REDUCTION: This exam was performed according to the departmental dose-optimization program which includes automated exposure control, adjustment of the mA and/or kV according to patient size and/or use of iterative reconstruction technique. COMPARISON:  CT abdomen pelvis 04/06/2023  FINDINGS: Segmentation: 5 lumbar type vertebrae. Alignment: Normal. Vertebrae: Diffusely decreased bone density. Multilevel moderate to severe degenerative changes of the spine. Posterior disc osteophyte complex formation at the L3-L4, L4-L5, L5-S1 levels. No acute fracture or focal pathologic process. Paraspinal and other soft tissues: Negative. Disc levels: Multilevel intervertebral disc space narrowing with fusion at the L2-L3 level and intervertebral disc space vacuum phenomenon at the T12-L1, L4-L5, L5-S1 levels. Other: Please see separately dictated CT abdomen pelvis 04/06/2023 IMPRESSION: 1. No acute displaced fracture or traumatic listhesis of the lumbar spine. 2. Diffusely decreased bone density. 3. Multilevel severe degenerative changes of the lumbar spine. Electronically Signed   By: Tish Frederickson M.D.   On: 04/07/2023 00:26   CT ABDOMEN PELVIS W CONTRAST  Result Date: 04/06/2023 CLINICAL DATA:  Pancreatitis, acute, severe. Recurrent falls. Generalized weakness. EXAM: CT ABDOMEN AND PELVIS WITH CONTRAST TECHNIQUE: Multidetector CT imaging of the abdomen and pelvis was performed using the standard protocol following bolus administration of intravenous contrast. RADIATION DOSE REDUCTION: This exam was performed according to the departmental dose-optimization program which includes automated exposure control, adjustment of the mA and/or kV according to patient size and/or use of iterative reconstruction technique. CONTRAST:  OMNIPAQUE IOHEXOL 300 MG/ML  SOLN COMPARISON:  03/11/2023 FINDINGS: Lower chest: Bibasilar atelectasis or scarring, similar prior study. No effusions. Cardiomegaly. Hepatobiliary: Low-density adjacent to the falciform ligament, likely focal fatty infiltration. Vascular shunting/malformation again seen in the posterior right hepatic lobe, stable. No focal hepatic abnormality. Prior cholecystectomy. Slight intrahepatic and extrahepatic biliary ductal dilatation likely related to  age and post cholecystectomy  state. Pancreas: Edema/stranding around the pancreas is similar prior study suggesting acute pancreatitis. Spleen: No focal abnormality.  Normal size. Adrenals/Urinary Tract: 5 mm left midpole renal stone. No hydronephrosis bilaterally. No suspicious renal or adrenal mass. Urinary bladder is obscured by beam hardening artifact from bilateral hip replacements. Stomach/Bowel: Diffuse colonic diverticulosis, most pronounced in the left colon. Postoperative changes from gastric bypass. Small bowel decompressed. Vascular/Lymphatic: No evidence of aneurysm or adenopathy. Reproductive: Obscured by beam hardening artifact from bilateral hip replacements. Other: Trace free fluid in the cul-de-sac.  No free air. Musculoskeletal: No acute bony abnormality. IMPRESSION: Stranding/edema around the pancreas suggesting acute pancreatitis. This is similar prior study. Focal fatty infiltration of the liver adjacent to the falciform ligament. Prior gastric bypass.  No complicating feature. Left nephrolithiasis.  No hydronephrosis. Colonic diverticulosis. Electronically Signed   By: Charlett Nose M.D.   On: 04/06/2023 23:40   CT Head Wo Contrast  Result Date: 04/06/2023 CLINICAL DATA:  Mental status change, unknown cause EXAM: CT HEAD WITHOUT CONTRAST TECHNIQUE: Contiguous axial images were obtained from the base of the skull through the vertex without intravenous contrast. RADIATION DOSE REDUCTION: This exam was performed according to the departmental dose-optimization program which includes automated exposure control, adjustment of the mA and/or kV according to patient size and/or use of iterative reconstruction technique. COMPARISON:  MRI head 03/11/2023 FINDINGS: Brain: Patchy and confluent areas of decreased attenuation are noted throughout the deep and periventricular white matter of the cerebral hemispheres bilaterally, compatible with chronic microvascular ischemic disease. No evidence of  large-territorial acute infarction. No parenchymal hemorrhage. No mass lesion. No extra-axial collection. No mass effect or midline shift. No hydrocephalus. Basilar cisterns are patent. Vascular: No hyperdense vessel. Atherosclerotic calcifications are present within the cavernous internal carotid arteries. Skull: No acute fracture or focal lesion. Sinuses/Orbits: Paranasal sinuses and mastoid air cells are clear. Bilateral lens replacement. Otherwise the orbits are unremarkable. Other: None. IMPRESSION: No acute intracranial abnormality. Electronically Signed   By: Tish Frederickson M.D.   On: 04/06/2023 23:32   DG Chest Port 1 View  Result Date: 04/06/2023 CLINICAL DATA:  weakness. EXAM: PORTABLE CHEST 1 VIEW COMPARISON:  03/11/2023. FINDINGS: Increased interstitial markings are essentially similar to the prior study. No acute consolidation or major lung collapse. Bilateral costophrenic angles are clear. Stable cardio-mediastinal silhouette. Aortic arch calcifications noted. No acute osseous abnormalities. Note is made of bilateral shoulder arthroplasty hardware. The soft tissues are within normal limits. IMPRESSION: No acute cardiopulmonary process. Electronically Signed   By: Jules Schick M.D.   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  This will be repeated. PTH about 3 weeks ago was 3 and about 2 months ago was 5.  This will be repeated Calcium supplementation will be stopped Consider calcitonin if calcium level continues to stay elevated despite IV hydration Patient's calcium was being managed by her endocrinologist per medical record Patient will need close follow-up with her endocrinologist  Generalized weakness and deconditioning History of recurrent falls Continue fall precaution Continue PT/OT eval and treat  Hypotension Continue IV hydration Continue peripheral IV Levophed to maintain MAP of 65 and above and with plan to wean patient off this as tolerated Hold all BP meds at this time  Hypoalbuminemia possibly secondary to severe protein calorie malnutrition IV albumin 25 g x 1 was given  Acute pancreatitis CT abdomen pelvis was suggestive of pancreatitis Lipase was elevated at 306 She denies any abdominal pain Continue IV hydration  Dysphagia Patient states that food gets stuck in her throat from time to time EGD done in September 2023 showed normal esophagus and jejunum Patient will be placed n.p.o. at this time Speech therapist will be consulted and we will await further recommendations  Elevated troponin possibly secondary to type II demand ischemia Troponin 416 > 315  CKD 3B BUN 26/1.27 (baseline at 1.1-1.3) Renally adjust medications, avoid nephrotoxic agents/dehydration/hypotension  Acquired hypothyroidism Continue Synthroid  Essential hypertension BP meds will be held at this  time due to hypotension  GERD Continue Protonix  History of thyroid cancer (2006) Patient follows with Dr. Jomarie Longs of endocrinology Continue Synthroid   DVT prophylaxis: Lovenox  Advance Care Planning: CODE STATUS: Full code  Consults: None  Family Communication: None at bedside  Severity of Illness: The appropriate patient status for this patient is INPATIENT. Inpatient status is judged to be reasonable and necessary in order to provide the required intensity of service to ensure the patient's safety. The patient's presenting symptoms, physical exam findings, and initial radiographic and laboratory data in the context of their chronic comorbidities is felt to place them at high risk for further clinical deterioration. Furthermore, it is not anticipated that the patient will be medically stable for discharge from the hospital within 2 midnights of admission.   * I certify that at the point of admission it is my clinical judgment that the patient will require inpatient hospital care spanning beyond 2 midnights from the point of admission due to high intensity of service, high risk for further deterioration and high frequency of surveillance required.*  Author: Frankey Shown, DO 04/07/2023 7:13 AM  For on call review [email or web address].

## 2023-04-07 NOTE — Progress Notes (Signed)
Elink monitoring for the code sepsis protocol.  

## 2023-04-07 NOTE — Evaluation (Signed)
Clinical/Bedside Swallow Evaluation Patient Details  Name: Kathy [name] MRN: [id number] Date of Birth: 07-25-1941  Today's Date: 04/07/2023 Time: SLP Start Time (ACUTE ONLY): 1245 SLP Stop Time (ACUTE ONLY): 1300 SLP Time Calculation (min) (ACUTE ONLY): 15 min  Past Medical History:  Past Medical History:  Diagnosis Date   Abnormal findings on diagnostic imaging of heart and coronary circulation    Anemia    FROM BLEEDING ULCER   Anxiety    takes Alprazolam daily as needed   Arthritis    dx with RA 2017   Bariatric surgery status    Cataract    Diverticulosis    Gastro-esophageal reflux disease with esophagitis    Gastrojejunal ulcer with hemorrhage    Headache(784.0)    occasionally   History of blood transfusion    no abnormal reaction noted   History of bronchitis    last time many yrs ago   Hyperlipidemia    PT DENIES THIS DX -  ON NO MEDS AND NO ONE HAS TOLD HER   Hypertension    takes Amlodipine daily   Hypothyroidism    takes Synthroid daily   Insomnia    Joint pain    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   Localized edema    Nocturia    Numbness    occasionally left arm at night   Obesity    Osteoporosis    takes Fosamax weekly   Pain in joint involving pelvic region and thigh    Peripheral edema    takes Lasix daily as needed   PONV (postoperative nausea and vomiting)    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/2017   Rheumatoid arthritis, unspecified (HCC)    Rotator cuff arthropathy, 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   Sleep apnea    Phreesia 08/22/2020   Stomach ulcer    Thyroid disease    Phreesia 08/22/2020   Unspecified injury of muscle(s) and tendon(s) of the rotator cuff of left shoulder, subsequent encounter    Unspecified osteoarthritis, unspecified site    Wears glasses    Wears partial dentures    Past Surgical History:  Past Surgical History:  Procedure Laterality Date   ABDOMINAL HYSTERECTOMY     partial   cataract  surgery Bilateral    CHOLECYSTECTOMY     COLONOSCOPY     ESOPHAGOGASTRODUODENOSCOPY (EGD) WITH PROPOFOL N/A 01/11/2022   Procedure: ESOPHAGOGASTRODUODENOSCOPY (EGD) WITH PROPOFOL;  Surgeon: Iva Boop, MD;  Location: [facility] ENDOSCOPY;  Service: Gastroenterology;  Laterality: N/A;   EYE SURGERY     CATARACTS BOTH   gastric bypass surgery     HOT HEMOSTASIS N/A 01/11/2022   Procedure: HOT HEMOSTASIS (ARGON PLASMA COAGULATION/BICAP);  Surgeon: Iva Boop, MD;  Location: [facility] ENDOSCOPY;  Service: Gastroenterology;  Laterality: N/A;   JOINT REPLACEMENT Bilateral    hip   REVERSE SHOULDER ARTHROPLASTY Right 05/11/2016   Procedure: REVERSE SHOULDER ARTHROPLASTY;  Surgeon: Teryl Lucy, MD;  Location: [facility] OR;  Service: Orthopedics;  Laterality: Right;   REVISION TOTAL HIP ARTHROPLASTY Left 10/03/2013   DR Turner Daniels   ROTATOR CUFF REPAIR     SCLEROTHERAPY  01/11/2022   Procedure: Susa Day;  Surgeon: Iva Boop, MD;  Location: [facility] ENDOSCOPY;  Service: Gastroenterology;;   TOTAL HIP REVISION Left 10/03/2013   Procedure: TOTAL HIP REVISION- left;  Surgeon: Nestor Lewandowsky, MD;  Location: [facility] OR;  Service: Orthopedics;  Laterality: Left;   TOTAL KNEE ARTHROPLASTY Left 11/29/2017   Procedure: LEFT TOTAL KNEE ARTHROPLASTY;  Surgeon:  Teryl Lucy, MD;  Location: [facility] OR;  Service: Orthopedics;  Laterality: Left;   TOTAL SHOULDER ARTHROPLASTY Left 11/09/2016   Procedure: TOTAL REVERSE SHOULDER ARTHROPLASTY;  Surgeon: Teryl Lucy, MD;  Location: [facility] OR;  Service: Orthopedics;  Laterality: Left;   TOTAL SHOULDER REPLACEMENT Left 10/2016   TOTAL THYROIDECTOMY     UPPER GASTROINTESTINAL ENDOSCOPY     HPI:  Per MD: Kathy [name] is a 82 y.o. 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  Patient states she slid out  of bed and fell on the floor, but was unable to get up.  A friend of hers who was unable to reach her on the phone stopped by to check on her and saw her on the floor, EMS was activated and patient was sent to the ED for further evaluation and management.  Patient endorsed generalized weakness that has been ongoing for a few days, she has been having difficulty in ambulation due to weakness.  Patient ambulates with a walker at baseline and she lives alone.  Patient was recently admitted from 8/9 to 03/16/2023 due to persistent hypercalcemia, generalized weakness and deconditioning with history of falls, she was evaluated by PT and SNF rehab was recommended.  She was also admitted from 6/23 to 01/31/2023 due to hypercalcemia. CT abdomen pelvis showed stranding/edema around the pancreas suggesting acute pancreatitis.  CT head without contrast showed no acute ventricular abnormality  Chest x-ray showed no acute cardiopulmonary process  CT lumbar spine without contrast showed no acute displaced fracture or traumatic listhesis of the lumbar spine. Patient states that food gets stuck in her throat from time to time  EGD done in September 2023 showed normal esophagus and jejunum. BSE requested.    Assessment / Plan / Recommendation  Clinical Impression  Clinical swallowing evaluation completed while Pt was sitting upright in her chair after consuming clear liquid lunch tray. Pt reports globus sensation and food "not going down" unless she "sits up straight". SLP provided education that Pt should always sit upright for PO. Pt consumed all textures and consistencies provided without overt s/sx of aspiration. From an oropharyngel standpoint recommend progress to regular/thin diet when medically appropriate. There are no further ST needs noted at this time, ST will sign off. Thank you for this referral, SLP Visit Diagnosis: Dysphagia, oropharyngeal phase (R13.12)    Aspiration Risk  Mild aspiration risk    Diet  Recommendation Regular;Thin liquid    Liquid Administration via: Cup;Straw Medication Administration: Whole meds with liquid Supervision: Patient able to self feed Compensations: Minimize environmental distractions;Slow rate;Small sips/bites Postural Changes: Seated upright at 90 degrees    Other  Recommendations Oral Care Recommendations: Oral care BID    Recommendations for follow up therapy are one component of a multi-disciplinary discharge planning process, led by the attending physician.  Recommendations may be updated based on patient status, additional functional criteria and insurance authorization.  Follow up Recommendations No SLP follow up      Assistance Recommended at Discharge    Functional Status Assessment Patient has had a recent decline in their functional status and demonstrates the ability to make significant improvements in function in a reasonable and predictable amount of time.  Frequency and Duration            Prognosis        Swallow Study  General Date of Onset: 04/07/23 HPI: Per MD: Kathy [name] is a 82 y.o. 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  Patient states she slid out of bed and fell on the floor, but was unable to get up.  A friend of hers who was unable to reach her on the phone stopped by to check on her and saw her on the floor, EMS was activated and patient was sent to the ED for further evaluation and management.  Patient endorsed generalized weakness that has been ongoing for a few days, she has been having difficulty in ambulation due to weakness.  Patient ambulates with a walker at baseline and she lives alone.  Patient was recently admitted from 8/9 to 03/16/2023 due to persistent hypercalcemia, generalized weakness and deconditioning with history  of falls, she was evaluated by PT and SNF rehab was recommended.  She was also admitted from 6/23 to 01/31/2023 due to hypercalcemia. CT abdomen pelvis showed stranding/edema around the pancreas suggesting acute pancreatitis.  CT head without contrast showed no acute ventricular abnormality  Chest x-ray showed no acute cardiopulmonary process  CT lumbar spine without contrast showed no acute displaced fracture or traumatic listhesis of the lumbar spine. Patient states that food gets stuck in her throat from time to time  EGD done in September 2023 showed normal esophagus and jejunum. BSE requested. Type of Study: Bedside Swallow Evaluation Previous Swallow Assessment: none in chart Diet Prior to this Study: Clear liquid diet Temperature Spikes Noted: No History of Recent Intubation: No Behavior/Cognition: Alert;Cooperative;Pleasant mood Oral Cavity Assessment: Within Functional Limits Oral Care Completed by SLP: Recent completion by staff Oral Cavity - Dentition: Adequate natural dentition Vision: Functional for self-feeding Self-Feeding Abilities: Able to feed self Patient Positioning: Upright in bed Baseline Vocal Quality: Normal Volitional Cough: Strong Volitional Swallow: Able to elicit    Oral/Motor/Sensory Function Overall Oral Motor/Sensory Function: Within functional limits   Ice Chips Ice chips: Within functional limits   Thin Liquid Thin Liquid: Within functional limits    Nectar Thick Nectar Thick Liquid: Not tested   Honey Thick Honey Thick Liquid: Not tested   Puree Puree: Within functional limits   Solid     Solid: Within functional limits     Mikail Goostree H. Romie Levee, CCC-SLP Speech Language Pathologist  Georgetta Haber 04/07/2023,1:34 PM

## 2023-04-08 ENCOUNTER — Inpatient Hospital Stay (HOSPITAL_COMMUNITY)
Admit: 2023-04-08 | Discharge: 2023-04-08 | Disposition: A | Discharge status: Home / Self Care | Payer: 59 | Attending: Internal Medicine | Admitting: Internal Medicine

## 2023-04-08 ENCOUNTER — Ambulatory Visit (HOSPITAL_COMMUNITY)
Admit: 2023-04-08 | Discharge: 2023-04-08 | Disposition: A | Discharge status: Home / Self Care | Payer: 59 | Source: Ambulatory Visit | Attending: Student | Admitting: Student

## 2023-04-08 DIAGNOSIS — R1310 Dysphagia, unspecified: Secondary | ICD-10-CM | POA: Diagnosis not present

## 2023-04-08 DIAGNOSIS — C9000 Multiple myeloma not having achieved remission: Secondary | ICD-10-CM | POA: Insufficient documentation

## 2023-04-08 DIAGNOSIS — R296 Repeated falls: Secondary | ICD-10-CM | POA: Diagnosis not present

## 2023-04-08 DIAGNOSIS — N179 Acute kidney failure, unspecified: Secondary | ICD-10-CM

## 2023-04-08 DIAGNOSIS — R5381 Other malaise: Secondary | ICD-10-CM | POA: Diagnosis not present

## 2023-04-08 DIAGNOSIS — R571 Hypovolemic shock: Secondary | ICD-10-CM

## 2023-04-08 LAB — RENAL FUNCTION PANEL
Albumin: <1.5 g/dL — ABNORMAL LOW (ref 3.5–5.0)
Anion gap: 7 (ref 5–15)
BUN: 16 mg/dL (ref 8–23)
CO2: 23 mmol/L (ref 22–32)
Calcium: 11.9 mg/dL — ABNORMAL HIGH (ref 8.9–10.3)
Chloride: 104 mmol/L (ref 98–111)
Creatinine, Ser: 0.89 mg/dL (ref 0.44–1.00)
GFR, Estimated: >60 mL/min (ref >60)
Glucose, Bld: 74 mg/dL (ref 70–99)
Phosphorus: 1.4 mg/dL — ABNORMAL LOW (ref 2.5–4.6)
Potassium: 3.8 mmol/L (ref 3.5–5.1)
Sodium: 134 mmol/L — ABNORMAL LOW (ref 135–145)

## 2023-04-08 LAB — CBC WITH DIFFERENTIAL/PLATELET
Abs Immature Granulocytes: 0.02 10*3/uL (ref 0.00–0.07)
Basophils Absolute: 0 10*3/uL (ref 0.0–0.1)
Basophils Relative: 0 %
Eosinophils Absolute: 0 10*3/uL (ref 0.0–0.5)
Eosinophils Relative: 1 %
HCT: 25.7 % — ABNORMAL LOW (ref 36.0–46.0)
Hemoglobin: 8.2 g/dL — ABNORMAL LOW (ref 12.0–15.0)
Immature Granulocytes: 0 %
Lymphocytes Relative: 29 %
Lymphs Abs: 1.4 10*3/uL (ref 0.7–4.0)
MCH: 33.2 pg (ref 26.0–34.0)
MCHC: 31.9 g/dL (ref 30.0–36.0)
MCV: 104 fL — ABNORMAL HIGH (ref 80.0–100.0)
Monocytes Absolute: 0.5 10*3/uL (ref 0.1–1.0)
Monocytes Relative: 10 %
Neutro Abs: 2.9 10*3/uL (ref 1.7–7.7)
Neutrophils Relative %: 60 %
Platelets: 169 10*3/uL (ref 150–400)
RBC: 2.47 MIL/uL — ABNORMAL LOW (ref 3.87–5.11)
RDW: 18.8 % — ABNORMAL HIGH (ref 11.5–15.5)
WBC: 4.8 10*3/uL (ref 4.0–10.5)
nRBC: 0.8 % — ABNORMAL HIGH (ref 0.0–0.2)

## 2023-04-08 LAB — PARATHYROID HORMONE, INTACT (NO CA): PTH: 4 pg/mL — ABNORMAL LOW (ref 15–65)

## 2023-04-08 LAB — MAGNESIUM: Magnesium: 1.7 mg/dL (ref 1.7–2.4)

## 2023-04-08 LAB — LACTATE DEHYDROGENASE: LDH: 200 U/L — ABNORMAL HIGH (ref 98–192)

## 2023-04-08 MED ORDER — MIDAZOLAM HCL 2 MG/2ML IJ SOLN
INTRAMUSCULAR | Status: AC | PRN
  Administered 2023-04-08: .5 mg via INTRAVENOUS

## 2023-04-08 MED ORDER — FENTANYL CITRATE (PF) 100 MCG/2ML IJ SOLN
INTRAMUSCULAR | Status: AC
  Filled 2023-04-08: qty 2

## 2023-04-08 MED ORDER — LIDOCAINE HCL (PF) 1 % IJ SOLN
10.0000 mL | Freq: Once | INTRAMUSCULAR | Status: AC
  Administered 2023-04-08: 10 mL
  Filled 2023-04-08: qty 10

## 2023-04-08 MED ORDER — FENTANYL CITRATE (PF) 100 MCG/2ML IJ SOLN
INTRAMUSCULAR | Status: AC | PRN
Start: 2023-04-08 — End: 2023-04-08
  Administered 2023-04-08 (×2): 25 ug via INTRAVENOUS

## 2023-04-08 MED ORDER — MIDAZOLAM HCL 2 MG/2ML IJ SOLN
INTRAMUSCULAR | Status: AC
  Filled 2023-04-08: qty 2

## 2023-04-08 MED ORDER — POTASSIUM PHOSPHATES 15 MMOLE/5ML IV SOLN
30.0000 mmol | Freq: Once | INTRAVENOUS | Status: AC
  Administered 2023-04-08: 30 mmol via INTRAVENOUS
  Filled 2023-04-08: qty 10

## 2023-04-08 NOTE — Progress Notes (Signed)
OT Cancellation Note  Patient Details Name: [name] MRN: [id number] DOB: 20-Apr-1941   Cancelled Treatment:     Per RN, pt was just transported to [facility] for a bone biopsy and will return to [facility] in a few hours. Will attempt to f/u with pt as schedule allows.     Bevelyn Ngo OTR/L 04/08/2023, 8:53 AM

## 2023-04-08 NOTE — TOC Progression Note (Signed)
Transition of Care [facility]) - Progression Note    Patient Details  Name: Kathy [name] MRN: [id number] Date of Birth: 06-Oct-1940  [facility] CM/SW Contact  [place] Her[place], [place] Phone Number: 04/08/2023, 1:09 PM  Clinical Narrative:    CSW spoke with pt in room about SNF recommendation. Pt states that she would like SNF referral sent to [facility] as she has been there previously. CSW to complete referral and send out. TOC to follow.   Expected Discharge Plan: Home w Home Health Services Barriers to Discharge: Continued Medical Work up  Expected Discharge Plan and Services       Living arrangements for the past 2 months: Single Family Home                                       Social Determinants of Health (SDOH) Interventions SDOH Screenings   Food Insecurity: No Food Insecurity (04/07/2023)  Housing: Low Risk  (04/07/2023)  Transportation Needs: No Transportation Needs (04/07/2023)  Utilities: Not At Risk (04/07/2023)  Alcohol Screen: Low Risk  (09/14/2021)  Depression (PHQ2-9): Low Risk  (03/30/2023)  Financial Resource Strain: Low Risk  (09/14/2021)  Physical Activity: Sufficiently Active (09/14/2021)  Social Connections: Moderately Isolated (09/14/2021)  Stress: No Stress Concern Present (09/14/2021)  Tobacco Use: Low Risk  (04/06/2023)    Readmission Risk Interventions    03/16/2023   10:32 AM  Readmission Risk Prevention Plan  Transportation Screening Complete  PCP or Specialist Appt within 3-5 Days Complete  HRI or Home Care Consult Complete  Social Work Consult for Recovery Care Planning/Counseling Complete  Palliative Care Screening Not Applicable  Medication Review Ocean[employer]) Complete

## 2023-04-08 NOTE — Plan of Care (Signed)

## 2023-04-08 NOTE — Procedures (Signed)
  Procedure:  CT R iliac bone marrow biopsy   Preprocedure diagnosis: myeloma Postprocedure diagnosis: same EBL:    minimal Complications:   none immediate  See full dictation in [employer].  Thora Lance MD Main # [phone] Pager  [phone] Mobile [phone]

## 2023-04-08 NOTE — Progress Notes (Signed)
Progress Note   Patient: Kathy [name] QIO:[id number] DOB: [birth year] DOA: 04/06/2023     1 DOS: the patient was seen and examined on 04/08/2023   Brief hospital admission narrative: As per H&P written by Dr. Thomes Dinning on 04/07/2023 Kathy [name] is a 82 y.o. 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  Patient states she slid out of bed and fell on the floor, but was unable to get up.  A friend of hers who was unable to reach her on the phone stopped by to check on her and saw her on the floor, EMS was activated and patient was sent to the ED for further evaluation and management.  Patient endorsed generalized weakness that has been ongoing for a few days, she has been having difficulty in ambulation due to weakness.  Patient ambulates with a walker at baseline and she lives alone. Patient was recently admitted from 8/9 to 03/16/2023 due to persistent hypercalcemia, generalized weakness and deconditioning with history of falls, she was evaluated by PT and SNF rehab was recommended. She was also admitted from 6/23 to 01/31/2023 due to hypercalcemia   ED Course:  In the emergency department, BP on arrival was 96/61, temperature was 97.5, respiration 22/min, pulse 76 bpm, O2 sat 98% on room air.  Workup in the ED showed macrocytic anemia.  BMP was normal except for bicarb of 20, BUN 26/1.27 (baseline at 1.1-1.3).  Albumin < 1.5, troponin 416 > 315, lactic acid 2.2 > 2.0, lipase 306, urinalysis was normal.  SARS coronavirus 2 was negative, blood culture pending CT abdomen pelvis showed stranding/edema around the pancreas suggesting acute pancreatitis. CT head without contrast showed no acute ventricular abnormality Chest x-ray showed no acute cardiopulmonary process CT lumbar spine without contrast showed no acute  displaced fracture or traumatic listhesis of the lumbar spine. IV cefepime and metronidazole were given due to initial thoughts of intra-abdominal infection.  IV hydration was provided.  Percocet 5/325 mg x 1 was given.  IV Levophed was started due to hypotension despite IV hydration. Hospitalist was asked to admit patient for further evaluation and management.  Assessment and Plan: Hypovolemic shock/hypotension -In the setting of severe dehydration and poor oral intake -Patient failed to improved with fluid resuscitation alone and require the use of vasopressors -Continue to wean off Levophed -Continue to maintain adequate hydration. -Follow vital signs trend. -Continue the use of midodrine.  Acute kidney injury on chronic kidney disease stage II -Associated with prerenal azotemia and underlying diagnosis of multiple myeloma -Continue minimizing nephrotoxic agents -Continue to maintain fluid resuscitation -Avoid the use of contrast and hypotension -Follow renal function trend. -Patient with chronic kidney disease stage II based on GFR.  Hypercalcemia -Recurrent and in the setting of multiple myeloma -Continue to maintain adequate hydration -Patient is status post Zometa -Continue to follow calcium levels. -Oncology service on board and will follow further recommendations and care -Status post bone marrow biopsy 04/08/2023.  Multiple myeloma -Oncology on board -Continue to follow recommendations and treatment initiation -Status post bone marrow biopsy on 04/08/2023 by IR  Hypoalbuminemia secondary to severe protein calorie malnutrition -IV albumin x 1 given -Continue feeding supplements -Continue supportive care and encourage better oral nutrition.  Dysphagia -Continue clear liquid diet for now -Follow speech therapy recommendations for diet consistency.  Gastroesophageal flux disease -Continue PPI.  History of thyroid  cancer -Continue patient follow-up with endocrinology  service -Continue Synthroid. -Status post thyroidectomy.  Anemia of chronic disease -Hemoglobin 8.2 -No overt bleeding -Appears to be secondary to underlying multiple myeloma -Continue to follow hemoglobin trend and transfuse as needed.  Subjective:  Afebrile, no chest pain, no nausea, no vomiting.  Chronically ill in appearance and underweight.  Still requiring pressor support.  Physical Exam: Vitals:   04/08/23 0700 04/08/23 0730 04/08/23 0800 04/08/23 1130  BP: (!) 126/58 127/62 (!) 118/53 (!) 106/56  Pulse: 75 (!) 55 73 69  Resp: 19 17 (!) 23 (!) 21  Temp:   98.2 F (36.8 C)   TempSrc:   Oral   SpO2: 97% 99% 99% 96%  Weight:      Height:       General exam: Alert, awake, oriented x 3; no chest pain, no nausea, no vomiting. Respiratory system: Clear to auscultation. Respiratory effort normal.  No using accessory muscle.  Good saturation on room air. Cardiovascular system:RRR. No rubs or gallops; no JVD. Gastrointestinal system: Abdomen is nondistended, soft and nontender. No organomegaly or masses felt. Normal bowel sounds heard. Central nervous system: Generally weak; no focal neurological deficits. Extremities: No cyanosis or clubbing. Skin: No petechiae. Psychiatry: Judgement and insight appear normal. Mood & affect appropriate.   Data Reviewed: Renal function panel: Sodium 134, potassium 3.8, chloride 104, bicarb 23, BUN 16, creatinine 0.89, calcium 11.9 and albumin less than 1.5 Magnesium: 1.7 CBC: WBCs 4.8, hemoglobin 8.2 and platelet count 169K LDH: 200  Family Communication: No family at bedside.  Disposition: Status is: Inpatient Remains inpatient appropriate because: Continue treatment with IV fluids, weaning of pressure support and follow recommendation by oncology service regarding treatment initiation for multiple myeloma.  Status post bone marrow biopsy 04/08/2023.   Planned Discharge Destination: Skilled nursing facility  CRITICAL CARE Performed by:  Vassie Loll   Total critical care time: 60 minutes  Critical care time was exclusive of separately billable procedures and treating other patients.  Critical care was necessary to treat or prevent imminent or life-threatening deterioration.  Critical care was time spent personally by me on the following activities: development of treatment plan with patient and/or surrogate as well as nursing, discussions with consultants, evaluation of patient's response to treatment, examination of patient, obtaining history from patient or surrogate, ordering and performing treatments and interventions, ordering and review of laboratory studies, ordering and review of radiographic studies, pulse oximetry and re-evaluation of patient's condition.   Author: Vassie Loll, MD 04/08/2023 12:47 PM  For on call review [email or web address].

## 2023-04-08 NOTE — H&P (Signed)
Chief Complaint: Patient was seen in consultation today for  Chief Complaint  Patient presents with   Fall   Referring Physician(s): Dr. Gwenlyn Perking  Supervising Physician: Dr. Deanne Coffer   Patient Status:   History of Present Illness: Kathy [name] is a 82 y.o. female with a medical history significant for hypercalcemia, GI bleed due to ulcer and HTN. She is being followed by Oncology for hypercalcemia and abnormal SPEP. She presented to the [facility] ED 04/06/23 due to recurrent falls and weakness. Work up was notable for possible pancreatitis and a calcium level of 12.4.  Oncology was consulted and there is concern for multiple myeloma.  Interventional Radiology has been asked to evaluate this patient for an image-guided bone marrow biopsy with aspiration for further work up.   Past Medical History:  Diagnosis Date   Abnormal findings on diagnostic imaging of heart and coronary circulation    Anemia    FROM BLEEDING ULCER   Anxiety    takes Alprazolam daily as needed   Arthritis    dx with RA 2017   Bariatric surgery status    Cataract    Diverticulosis    Gastro-esophageal reflux disease with esophagitis    Gastrojejunal ulcer with hemorrhage    Headache(784.0)    occasionally   History of blood transfusion    no abnormal reaction noted   History of bronchitis    last time many yrs ago   Hyperlipidemia    PT DENIES THIS DX -  ON NO MEDS AND NO ONE HAS TOLD HER   Hypertension    takes Amlodipine daily   Hypothyroidism    takes Synthroid daily   Insomnia    Joint pain    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   Localized edema    Nocturia    Numbness    occasionally left arm at night   Obesity    Osteoporosis    takes Fosamax weekly   Pain in joint involving pelvic region and thigh    Peripheral edema    takes Lasix daily as needed   PONV (postoperative nausea and vomiting)    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/2017   Rheumatoid  arthritis, unspecified (HCC)    Rotator cuff arthropathy, 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   Sleep apnea    Phreesia 08/22/2020   Stomach ulcer    Thyroid disease    Phreesia 08/22/2020   Unspecified injury of muscle(s) and tendon(s) of the rotator cuff of left shoulder, subsequent encounter    Unspecified osteoarthritis, unspecified site    Wears glasses    Wears partial dentures     Past Surgical History:  Procedure Laterality Date   ABDOMINAL HYSTERECTOMY     partial   cataract surgery Bilateral    CHOLECYSTECTOMY     COLONOSCOPY     ESOPHAGOGASTRODUODENOSCOPY (EGD) WITH PROPOFOL N/A 01/11/2022   Procedure: ESOPHAGOGASTRODUODENOSCOPY (EGD) WITH PROPOFOL;  Surgeon: Iva Boop, MD;  Location: [facility] ENDOSCOPY;  Service: Gastroenterology;  Laterality: N/A;   EYE SURGERY     CATARACTS BOTH   gastric bypass surgery     HOT HEMOSTASIS N/A 01/11/2022   Procedure: HOT HEMOSTASIS (ARGON PLASMA COAGULATION/BICAP);  Surgeon: Iva Boop, MD;  Location: [facility] ENDOSCOPY;  Service: Gastroenterology;  Laterality: N/A;   JOINT REPLACEMENT Bilateral    hip   REVERSE SHOULDER ARTHROPLASTY Right 05/11/2016   Procedure: REVERSE SHOULDER ARTHROPLASTY;  Surgeon: Teryl Lucy, MD;  Location: [facility] OR;  Service: Orthopedics;  Laterality: Right;  REVISION TOTAL HIP ARTHROPLASTY Left 10/03/2013   DR Turner Daniels   ROTATOR CUFF REPAIR     SCLEROTHERAPY  01/11/2022   Procedure: Susa Day;  Surgeon: Iva Boop, MD;  Location: [facility] ENDOSCOPY;  Service: Gastroenterology;;   TOTAL HIP REVISION Left 10/03/2013   Procedure: TOTAL HIP REVISION- left;  Surgeon: Nestor Lewandowsky, MD;  Location: [facility] OR;  Service: Orthopedics;  Laterality: Left;   TOTAL KNEE ARTHROPLASTY Left 11/29/2017   Procedure: LEFT TOTAL KNEE ARTHROPLASTY;  Surgeon: Teryl Lucy, MD;  Location: [facility] OR;  Service: Orthopedics;  Laterality: Left;   TOTAL SHOULDER ARTHROPLASTY Left 11/09/2016   Procedure: TOTAL REVERSE SHOULDER ARTHROPLASTY;  Surgeon: Teryl Lucy, MD;  Location: [facility] OR;  Service: Orthopedics;  Laterality: Left;   TOTAL SHOULDER REPLACEMENT Left 10/2016   TOTAL THYROIDECTOMY     UPPER GASTROINTESTINAL ENDOSCOPY      Allergies: Macrobid [nitrofurantoin]  Medications: Prior to Admission medications   Medication Sig Start Date End Date Taking? Authorizing Provider  acetaminophen (TYLENOL) 500 MG tablet Take 1 tablet (500 mg total) by mouth every 6 (six) hours as needed for mild pain, headache or fever (or Fever >/= 101). 03/16/23  Yes Emokpae, Courage, MD  Adalimumab (HUMIRA PEN) 40 MG/0.4ML PNKT Inject 40 mg into the skin every Saturday.   Yes [provider]  ALPRAZolam (XANAX) 0.25 MG tablet Take 1 tablet (0.25 mg total) by mouth 2 (two) times daily as needed for anxiety or sleep. TAKE 1 TABLET BY MOUTH TWICE DAILY AS NEEDED FOR ANXIETY OR SLEEP Strength: 0.25 mg 03/16/23  Yes Emokpae, Courage, MD  diclofenac Sodium (VOLTAREN) 1 % GEL APPLY (1) GRAM TO AFFECTED AREA DAILY. Patient taking differently: Apply 1 g topically daily. 08/11/22  Yes [name], MD  ferrous sulfate 325 (65 FE) MG EC tablet Take 1 tablet (325 mg total) by mouth daily. 03/16/23  Yes Emokpae, Courage, MD  furosemide (LASIX) 20 MG tablet Take 1 tablet (20 mg total) by mouth 2 (two) times daily. Patient taking differently: Take 20 mg by mouth daily as needed for fluid. 03/16/22  Yes [name], MD  levothyroxine (SYNTHROID) 50 MCG tablet Take 1 tablet (50 mcg total) by mouth daily. 03/16/23 03/15/24 Yes Emokpae, Courage, MD  Multiple Vitamin (MULTIVITAMIN WITH MINERALS) TABS tablet Take 1 tablet by mouth every morning.   Yes [provider]  Omega-3 Fatty Acids (FISH OIL PO) Take 1 capsule by mouth daily.   Yes [provider]  Omega-3 Fatty Acids (FISH OIL) 1000 MG CAPS Take by mouth. 03/17/23  Yes [provider]  omeprazole (PRILOSEC) 40 MG capsule Take 1 capsule (40 mg total) by mouth daily before breakfast. Open  capsule and swallow granules with liquid or applesauce 04/15/22  Yes Iva Boop, MD  potassium chloride SA (KLOR-CON M) 20 MEQ tablet Take 1 tablet (20 mEq total) by mouth daily. TAKE 1 TABLET (20 MEQ) BY MOUTH DAILY PRN with Lasix (EDEMA) 03/16/23  Yes Emokpae, Courage, MD  traMADol (ULTRAM) 50 MG tablet Take 1 tablet (50 mg total) by mouth every 12 (twelve) hours as needed for moderate pain. 03/16/23  Yes Shon Hale, MD  denosumab (PROLIA) 60 MG/ML SOSY injection inject 60mg  Subcutaneous every 6 months 180 days Patient not taking: Reported on 04/07/2023 10/07/22     denosumab (PROLIA) 60 MG/ML SOSY injection inject 60mg  Subcutaneous every 6 months 180 days Patient not taking: Reported on 04/07/2023 04/05/23     denosumab (PROLIA) 60 MG/ML SOSY injection  60mg  Subcutaneous every 6 months 180 days Patient not taking: Reported on 04/07/2023 04/06/23     denosumab (PROLIA) 60 MG/ML SOSY injection 60mg  Subcutaneous every 6 months 180 days Patient not taking: Reported on 04/07/2023 04/07/23     naloxone (NARCAN) nasal spray 4 mg/0.1 mL Place 1 spray into the nose as needed (opioid reversal).    [provider]  oxybutynin (DITROPAN XL) 10 MG 24 hr tablet Take 1 tablet (10 mg total) by mouth at bedtime. Patient not taking: Reported on 04/07/2023 02/21/23   [name], MD  oxybutynin (DITROPAN-XL) 10 MG 24 hr tablet Take by mouth. Patient not taking: Reported on 04/07/2023 03/16/23   [provider]  oxyCODONE-acetaminophen (PERCOCET/ROXICET) 5-325 MG tablet Take 1 tablet by mouth every 8 (eight) hours as needed for moderate pain or severe pain (pain not relieved by tramadol). Patient not taking: Reported on 04/07/2023 03/16/23   Shon Hale, MD     Family History  Problem Relation Age of Onset   Diabetes Mother    Congestive Heart Failure Mother    Lung cancer Father    Thrombosis Sister    CAD Brother        CABG   Colon cancer Neg Hx    Esophageal cancer Neg Hx    Stomach cancer  Neg Hx    Rectal cancer Neg Hx     Social History   Socioeconomic History   Marital status: Divorced    Spouse name: Not on file   Number of children: Not on file   Years of education: Not on file   Highest education level: Not on file  Occupational History   Occupation: disabled  Tobacco Use   Smoking status: Never    Passive exposure: Never   Smokeless tobacco: Never  Vaping Use   Vaping status: Never Used  Substance and Sexual Activity   Alcohol use: No   Drug use: No   Sexual activity: Not Currently    Birth control/protection: Surgical  Other Topics Concern   Not on file  Social History Narrative   Not on file   Social Determinants of Health   Financial Resource Strain: Low Risk  (09/14/2021)   Overall Financial Resource Strain (CARDIA)    Difficulty of Paying Living Expenses: Not hard at all  Food Insecurity: No Food Insecurity (04/07/2023)   Hunger Vital Sign    Worried About Running Out of Food in the Last Year: Never true    Ran Out of Food in the Last Year: Never true  Transportation Needs: No Transportation Needs (04/07/2023)   PRAPARE - Administrator, Civil Service (Medical): No    Lack of Transportation (Non-Medical): No  Physical Activity: Sufficiently Active (09/14/2021)   Exercise Vital Sign    Days of Exercise per Week: 6 days    Minutes of Exercise per Session: 40 min  Stress: No Stress Concern Present (09/14/2021)   [employer] of Occupational Health - Occupational Stress Questionnaire    Feeling of Stress : Not at all  Social Connections: Moderately Isolated (09/14/2021)   Social Connection and Isolation Panel [NHANES]    Frequency of Communication with Friends and Family: More than three times a week    Frequency of Social Gatherings with Friends and Family: More than three times a week    Attends Religious Services: More than 4 times per year    Active Member of [employer] or Organizations: No    Attends Banker Meetings:  Never    Marital Status: Divorced    Review of Systems: A 12 point ROS discussed and pertinent positives are indicated in the HPI above.  All other systems are negative.  Review of Systems  Constitutional:  Positive for fatigue. Negative for appetite change.  Respiratory:  Negative for cough and shortness of breath.   Cardiovascular:  Negative for chest pain and leg swelling.  Gastrointestinal:  Negative for abdominal pain, diarrhea, nausea and vomiting.  Neurological:  Negative for dizziness and headaches.    Vital Signs: BP (!) 118/53 (BP Location: Left Arm)   Pulse 73   Temp 98.2 F (36.8 C) (Oral)   Resp (!) 23   Ht 5\' 1"  (1.549 m)   Wt 149 lb 14.6 oz (68 kg)   SpO2 99%   BMI 28.33 kg/m   Physical Exam Constitutional:      General: She is not in acute distress.    Appearance: She is not ill-appearing.  HENT:     Mouth/Throat:     Mouth: Mucous membranes are moist.     Pharynx: Oropharynx is clear.  Cardiovascular:     Rate and Rhythm: Normal rate.     Pulses: Normal pulses.  Pulmonary:     Effort: Pulmonary effort is normal.  Abdominal:     Palpations: Abdomen is soft.     Tenderness: There is no abdominal tenderness.  Musculoskeletal:     Right lower leg: No edema.     Left lower leg: No edema.  Skin:    General: Skin is warm and dry.  Neurological:     Mental Status: She is alert and oriented to person, place, and time.     Imaging: CT L-SPINE NO CHARGE  Result Date: 04/07/2023 CLINICAL DATA:  Pt bib [facility] from home, for recurrent falls and generalized weakness- pt was hospitalized and placed in rehab for this same issue and discharged back home 2 week EXAM: CT LUMBAR SPINE WITHOUT CONTRAST TECHNIQUE: Multidetector CT imaging of the lumbar spine was performed without intravenous contrast administration. Multiplanar CT image reconstructions were also generated. RADIATION DOSE REDUCTION: This exam was performed according to the departmental dose-optimization  program which includes automated exposure control, adjustment of the mA and/or kV according to patient size and/or use of iterative reconstruction technique. COMPARISON:  CT abdomen pelvis 04/06/2023 FINDINGS: Segmentation: 5 lumbar type vertebrae. Alignment: Normal. Vertebrae: Diffusely decreased bone density. Multilevel moderate to severe degenerative changes of the spine. Posterior disc osteophyte complex formation at the L3-L4, L4-L5, L5-S1 levels. No acute fracture or focal pathologic process. Paraspinal and other soft tissues: Negative. Disc levels: Multilevel intervertebral disc space narrowing with fusion at the L2-L3 level and intervertebral disc space vacuum phenomenon at the T12-L1, L4-L5, L5-S1 levels. Other: Please see separately dictated CT abdomen pelvis 04/06/2023 IMPRESSION: 1. No acute displaced fracture or traumatic listhesis of the lumbar spine. 2. Diffusely decreased bone density. 3. Multilevel severe degenerative changes of the lumbar spine. Electronically Signed   By: Tish Frederickson M.D.   On: 04/07/2023 00:26   CT ABDOMEN PELVIS W CONTRAST  Result Date: 04/06/2023 CLINICAL DATA:  Pancreatitis, acute, severe. Recurrent falls. Generalized weakness. EXAM: CT ABDOMEN AND PELVIS WITH CONTRAST TECHNIQUE: Multidetector CT imaging of the abdomen and pelvis was performed using the standard protocol following bolus administration of intravenous contrast. RADIATION DOSE REDUCTION: This exam was performed according to the departmental dose-optimization program which includes automated exposure control, adjustment of the mA and/or kV according to patient size and/or use  of iterative reconstruction technique. CONTRAST:  OMNIPAQUE IOHEXOL 300 MG/ML  SOLN COMPARISON:  03/11/2023 FINDINGS: Lower chest: Bibasilar atelectasis or scarring, similar prior study. No effusions. Cardiomegaly. Hepatobiliary: Low-density adjacent to the falciform ligament, likely focal fatty infiltration. Vascular  shunting/malformation again seen in the posterior right hepatic lobe, stable. No focal hepatic abnormality. Prior cholecystectomy. Slight intrahepatic and extrahepatic biliary ductal dilatation likely related to age and post cholecystectomy state. Pancreas: Edema/stranding around the pancreas is similar prior study suggesting acute pancreatitis. Spleen: No focal abnormality.  Normal size. Adrenals/Urinary Tract: 5 mm left midpole renal stone. No hydronephrosis bilaterally. No suspicious renal or adrenal mass. Urinary bladder is obscured by beam hardening artifact from bilateral hip replacements. Stomach/Bowel: Diffuse colonic diverticulosis, most pronounced in the left colon. Postoperative changes from gastric bypass. Small bowel decompressed. Vascular/Lymphatic: No evidence of aneurysm or adenopathy. Reproductive: Obscured by beam hardening artifact from bilateral hip replacements. Other: Trace free fluid in the cul-de-sac.  No free air. Musculoskeletal: No acute bony abnormality. IMPRESSION: Stranding/edema around the pancreas suggesting acute pancreatitis. This is similar prior study. Focal fatty infiltration of the liver adjacent to the falciform ligament. Prior gastric bypass.  No complicating feature. Left nephrolithiasis.  No hydronephrosis. Colonic diverticulosis. Electronically Signed   By: Charlett Nose M.D.   On: 04/06/2023 23:40   CT Head Wo Contrast  Result Date: 04/06/2023 CLINICAL DATA:  Mental status change, unknown cause EXAM: CT HEAD WITHOUT CONTRAST TECHNIQUE: Contiguous axial images were obtained from the base of the skull through the vertex without intravenous contrast. RADIATION DOSE REDUCTION: This exam was performed according to the departmental dose-optimization program which includes automated exposure control, adjustment of the mA and/or kV according to patient size and/or use of iterative reconstruction technique. COMPARISON:  MRI head 03/11/2023 FINDINGS: Brain: Patchy and confluent  areas of decreased attenuation are noted throughout the deep and periventricular white matter of the cerebral hemispheres bilaterally, compatible with chronic microvascular ischemic disease. No evidence of large-territorial acute infarction. No parenchymal hemorrhage. No mass lesion. No extra-axial collection. No mass effect or midline shift. No hydrocephalus. Basilar cisterns are patent. Vascular: No hyperdense vessel. Atherosclerotic calcifications are present within the cavernous internal carotid arteries. Skull: No acute fracture or focal lesion. Sinuses/Orbits: Paranasal sinuses and mastoid air cells are clear. Bilateral lens replacement. Otherwise the orbits are unremarkable. Other: None. IMPRESSION: No acute intracranial abnormality. Electronically Signed   By: Tish Frederickson M.D.   On: 04/06/2023 23:32   DG Chest Port 1 View  Result Date: 04/06/2023 CLINICAL DATA:  weakness. EXAM: PORTABLE CHEST 1 VIEW COMPARISON:  03/11/2023. FINDINGS: Increased interstitial markings are essentially similar to the prior study. No acute consolidation or major lung collapse. Bilateral costophrenic angles are clear. Stable cardio-mediastinal silhouette. Aortic arch calcifications noted. No acute osseous abnormalities. Note is made of bilateral shoulder arthroplasty hardware. The soft tissues are within normal limits. IMPRESSION: No acute cardiopulmonary process. Electronically Signed   By: Jules Schick M.D.   On: 04/06/2023 18:05   CT CHEST ABDOMEN PELVIS W CONTRAST  Addendum Date: 03/11/2023   ADDENDUM REPORT: 03/11/2023 16:34 ADDENDUM: The original report was by Dr. Gaylyn Rong. The following addendum is by Dr. Gaylyn Rong: Please note that while series 2 raises the question of heterogeneous density in the pulmonary arteries which could be an indicator of pulmonary embolus, the IMAR images in series 3 clear up some of the streak artifact and this heterogeneity in the pulmonary arteries appears to  essentially resolve. However, please note that today's  exam was not protocol to assess for pulmonary embolus, and has reduced accuracy for pulmonary embolus especially due to the degree of streak artifact. Moreover, one additional point, there is some asymmetric density along the left iliopsoas tendon anterior to the hip, probably from a periprosthetic fluid collection tracking along the iliopsoas bursa. Electronically Signed   By: Gaylyn Rong M.D.   On: 03/11/2023 16:34   Result Date: 03/11/2023 CLINICAL DATA:  Weight loss, hypercalcemia EXAM: CT CHEST, ABDOMEN, AND PELVIS WITH CONTRAST TECHNIQUE: Multidetector CT imaging of the chest, abdomen and pelvis was performed following the standard protocol during bolus administration of intravenous contrast. RADIATION DOSE REDUCTION: This exam was performed according to the departmental dose-optimization program which includes automated exposure control, adjustment of the mA and/or kV according to patient size and/or use of iterative reconstruction technique. CONTRAST:  75mL OMNIPAQUE IOHEXOL 300 MG/ML  SOLN COMPARISON:  Renal ultrasound 01/25/2023 FINDINGS: CT CHEST FINDINGS Cardiovascular: Atherosclerotic vascular calcification of the thoracic aorta. Mild cardiomegaly. Mediastinum/Nodes: Unremarkable Lungs/Pleura: Volume loss with bandlike density in the left lower lobe favoring atelectasis. There is also milder dependent subsegmental atelectasis in the right lower lobe. No lung mass identified. Musculoskeletal: Bilateral reverse shoulder arthroplasties. Bony demineralization. Lower cervical and thoracic spondylosis. Irregular right eighth and tenth ribs, some of this may be from motion, cannot exclude age indeterminate rib fractures. CT ABDOMEN PELVIS FINDINGS Hepatobiliary: No significant parenchymal lesion identified. There is likely some vascular shunting posteriorly in the inferior margin of the right hepatic lobe. Substantial extrahepatic biliary  dilatation, common hepatic duct up to 1.7 cm. Cholecystectomy. Distal CBD up to 1.0 cm. Pancreas: Abnormal stranding and edema particularly around the pancreatic head and pancreatic body, with some relative hypodensity in the pancreatic head and body compared to the pancreatic tail. The appearance may be due to pancreatitis and edema, strictly speaking a small infiltrative pancreatic malignancy is not completely excluded. Spleen: Small size, otherwise unremarkable. Adrenals/Urinary Tract: No hydronephrosis. Nonobstructive 5 mm left mid kidney calculus. Mild cortical thinning compatible with mild atrophy. Stomach/Bowel: Prior gastric bypass. Sigmoid colon diverticulosis with scattered diverticula of the descending colon. Proximal wall thickening in the descending colon likely ascribe a bowl to nondistention although segmental colitis is not excluded. There is some edema in this vicinity which could be from the pancreas, less likely to be related to the colon. Normal appendix. Vascular/Lymphatic: Atherosclerosis is present, including aortoiliac atherosclerotic disease. No substantial stenosis in the celiac trunk or SMA. Reproductive: Uterus absent, adnexa unremarkable. Other: Trace ascites along the left paracolic gutter. Musculoskeletal: Bilateral hip prostheses with substantial lucency around the left acetabular plate component compatible with osteolysis. There is some irregularity of the quadrilateral plate and potentially some stress reaction involving the medial aspect of the left superior pubic ramus. Some of this osteolysis has been present back through 08/22/2013. There is also some demineralization of the right superior pubic ramus laterally especially anteriorly. Interbody fusion at L2-3. Lumbar spondylosis and degenerative disc disease causing mild multilevel impingement. IMPRESSION: 1. Abnormal stranding and edema around the pancreatic head and pancreatic body, with some relative hypodensity in the  pancreatic head and body compared to the pancreatic tail. The appearance may be due to pancreatitis and edema, strictly speaking a small infiltrative pancreatic malignancy is not completely excluded. Correlate with lipase levels and consider follow up pancreatic protocol MRI with and without contrast when feasible. 2. Substantial extrahepatic biliary dilatation, common hepatic duct up to 1.7 cm. This could be related to the pancreatic process. 3. Mild cardiomegaly.  4. Irregular right eighth and tenth ribs, some of this may be from motion, cannot exclude age indeterminate rib fractures. 5. Sigmoid colon diverticulosis. 6. Proximal wall thickening in the descending colon, probably from nondistention although segmental colitis is not excluded. 7. Nonobstructive 5 mm left mid kidney calculus. 8. Bony demineralization. 9. 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 There is some irregularity of the left quadrilateral plate and potentially some stress reaction involving the medial aspect of the left superior pubic ramus. Some of this osteolysis has been present back through 08/22/2013. 10. Lumbar spondylosis and degenerative disc disease causing mild multilevel impingement. 11. Prior gastric bypass. 12. Aortic atherosclerosis. Aortic Atherosclerosis (ICD10-I70.0). Electronically Signed: By: Gaylyn Rong M.D. On: 03/11/2023 16:21   MR Brain W and Wo Contrast  Result Date: 03/11/2023 CLINICAL DATA:  Metastatic disease evaluation. EXAM: MRI HEAD WITHOUT AND WITH CONTRAST TECHNIQUE: Multiplanar, multiecho pulse sequences of the brain and surrounding structures were obtained without and with intravenous contrast. CONTRAST:  7mL GADAVIST GADOBUTROL 1 MMOL/ML IV SOLN COMPARISON:  Same-day CT head FINDINGS: Brain: There is no acute intracranial hemorrhage, extra-axial fluid collection, or acute infarct. There is mild for age parenchymal volume loss with prominence of the  ventricular system and extra-axial CSF spaces. There is minimal background chronic small-vessel ischemic change The pituitary and suprasellar region are normal. There is no mass lesion or abnormal enhancement. There is no mass effect or midline shift. Vascular: Normal flow voids. Skull and upper cervical spine: There are numerous calvarial lesions which demonstrate elevated DWI signal. Some of the lesions are intrinsically T1 hyperintense (for example 18-92, 18-84). Sinuses/Orbits: The paranasal sinuses are clear. Bilateral lens implants are in place. The globes and orbits are otherwise unremarkable. Other: The mastoid air cells and middle ear cavities are clear. IMPRESSION: 1. No evidence of intracranial metastatic disease or other acute intracranial pathology. 2. Numerous calvarial lesions as seen on CT are primarily T1 hyperintense which is more typical of benign lesions, with metastatic disease considered less likely. Electronically Signed   By: Lesia Hausen M.D.   On: 03/11/2023 16:23   CT Head Wo Contrast  Result Date: 03/11/2023 CLINICAL DATA:  Head trauma, abnormal mental status (Age 81-64y) EXAM: CT HEAD WITHOUT CONTRAST TECHNIQUE: Contiguous axial images were obtained from the base of the skull through the vertex without intravenous contrast. RADIATION DOSE REDUCTION: This exam was performed according to the departmental dose-optimization program which includes automated exposure control, adjustment of the mA and/or kV according to patient size and/or use of iterative reconstruction technique. COMPARISON:  CT Head 06/09/17, 01/24/23 FINDINGS: Brain: No evidence of acute infarction, hemorrhage, hydrocephalus, extra-axial collection or mass lesion/mass effect. Sequela of mild chronic microvascular ischemic change Vascular: No hyperdense vessel or unexpected calcification. Skull: There are multiple scattered lucent calvarial lesions, some of which are new compared to 2018, for example the lesion in the right  parietal bone (series 3, image 40 Sinuses/Orbits: No middle ear or mastoid effusion. Paranasal sinuses are clear. Bilateral lens replacement. Orbits are otherwise unremarkable. Other: None. IMPRESSION: 1. No CT evidence of intracranial injury 2. Multiple scattered lucent calvarial lesions, some of which are new compared to 2018. While nonspecific, differential considerations include metastatic disease or multiple myeloma. Recommend correlation with clinical history and consider further evaluation with a contrast-enhanced MRI of the brain. Electronically Signed   By: Lorenza Cambridge M.D.   On: 03/11/2023 13:47   DG Chest Portable 1 View  Result Date: 03/11/2023 CLINICAL DATA:  Pain after trauma.  Weakness EXAM: PORTABLE CHEST 1 VIEW COMPARISON:  X-ray 01/23/2023 FINDINGS: Enlarged cardiopericardial silhouette with a tortuous and ectatic aorta. No consolidation, pneumothorax or effusion. No edema. Degenerative changes seen along the spine. 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 IMPRESSION: Enlarged cardiopericardial silhouette.  No edema or consolidation. Electronically Signed   By: Karen Kays M.D.   On: 03/11/2023 13:42    Labs:  CBC: Recent Labs    03/30/23 1229 04/06/23 1705 04/07/23 0715 04/08/23 0444  WBC 5.0 6.5 6.1 4.8  HGB 9.1* 9.2* 8.0* 8.2*  HCT 27.9* 28.5* 25.0* 25.7*  PLT 200 190 167 169    COAGS: Recent Labs    01/23/23 1400 04/06/23 2041  INR 1.1 1.6*  APTT <20*  --     BMP: Recent Labs    03/30/23 1229 04/06/23 2041 04/07/23 0715 04/08/23 0444  NA 135 136 135 134*  K 4.1 4.3 4.0 3.8  CL 102 107 106 104  CO2 23 20* 23 23  GLUCOSE 78 77 78 74  BUN 32* 26* 24* 16  CALCIUM 10.6* 12.4* 11.3* 11.9*  CREATININE 1.22* 1.27* 1.11* 0.89  GFRNONAA 45* 43* 50* >60    LIVER FUNCTION TESTS: Recent Labs    03/11/23 1156 03/12/23 0528 03/13/23 0425 03/15/23 0438 04/06/23 2041 04/07/23 0715 04/08/23 0444  BILITOT 0.6 0.4  --   --  0.5 0.5  --   AST  42* 29  --   --  77* 78*  --   ALT 20 19  --   --  28 29  --   ALKPHOS 66 52  --   --  60 55  --   PROT 11.7* 8.7*  --   --  9.2* 8.4*  --   ALBUMIN 2.0* <1.5*   < > <1.5* <1.5* <1.5* <1.5*   < > = values in this interval not displayed.    TUMOR MARKERS: No results for input(s): "AFPTM", "CEA", "CA199", "CHROMGRNA" in the last 8760 hours.  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 She was transported to [facility] from [facility] via [place]. She will return to [facility] post-procedure.  Risks and benefits of this procedure were discussed with the patient and/or patient's family including, but not limited to bleeding, infection, damage to adjacent structures or low yield requiring additional tests.  All of the questions were answered and there is agreement to proceed. She has been NPO.   Consent signed and in chart.  Thank you for this interesting consult.  I greatly enjoyed meeting Kathy [name] and look forward to participating in their care.  A copy of this report was sent to the requesting provider on this date.  Electronically Signed: Alwyn Ren, AGACNP-BC [phone] 04/08/2023, 9:00 AM   I spent a total of 20 Minutes    in face to face in clinical consultation, greater than 50% of which was counseling/coordinating care for bone marrow biopsy with aspiration.

## 2023-04-08 NOTE — Plan of Care (Signed)
[facility]. Levo weaned after transported back.  Ambulated to chair.  Patient doing well.

## 2023-04-08 NOTE — NC FL2 (Signed)
MEDICAID [id number] LEVEL OF CARE FORM     IDENTIFICATION  Patient Name: Kathy [name] Birthdate: 05/25/1941 Sex: female Admission Date (Current Location): 04/06/2023  [facility] and [place] Number:  [employer] and Address:  [facility],  [address], [place] [address]      Provider Number: [id number]  Attending Physician Name and Address:  Vassie Loll, MD  Relative Name and Phone Number:  Verlon Setting [facility]) [phone]    Current Level of Care: Hospital Recommended Level of Care: Skilled Nursing Facility Prior Approval Number:    Date Approved/Denied:   PASRR Number: [id number]  Discharge Plan: SNF    Current Diagnoses: Patient Active Problem List   Diagnosis Date Noted   Hypercalcemia of malignancy 04/07/2023   General weakness 04/07/2023   Recurrent falls 04/07/2023   Hypotension 04/07/2023   Hypoalbuminemia due to protein-calorie malnutrition (HCC) 04/07/2023   Acute prostatitis 04/07/2023   Chronic kidney disease, stage 3b (HCC) 04/07/2023   Dysphagia 04/07/2023   Acute pancreatitis 04/07/2023   History of thyroid cancer 04/07/2023   Microcytic anemia 03/30/2023   Physical deconditioning 03/30/2023   Elevated serum immunoglobulin free light chain level 03/30/2023   Leg swelling 02/21/2023   Hospital discharge follow-up 02/21/2023   Hypomagnesemia 02/21/2023   Urge incontinence of urine 02/21/2023   Hypercalcemia 01/23/2023   AKI (acute kidney injury) (HCC) 01/23/2023   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   Hammer toe of left foot 09/15/2022   Left foot pain 09/15/2022   Iron deficiency anemia due to chronic blood loss 07/19/2022   Rectal bleeding 01/21/2022   Gastrojejunal ulcer with hemorrhage    Encounter for general adult medical examination with abnormal findings 09/16/2021   Hypokalemia 09/16/2021   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    Bilateral impacted cerumen 11/19/2020   Hearing loss 11/19/2020   Malignant tumor of thyroid gland (HCC) 10/08/2020   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   Polyarthritis    Bariatric surgery status    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/2017   Rheumatoid arthritis (HCC) 11/16/2016   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   Rotator cuff arthropathy, 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   S/P shoulder replacement 05/11/2016   Allergic rhinitis 10/19/2013   GERD (gastroesophageal reflux disease) 10/19/2013   Osteoporosis    Essential hypertension    Acquired hypothyroidism    Hyperlipidemia    Anxiety    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   Unspecified protein-calorie malnutrition (HCC) 10/14/2011   Vitamin deficiency 10/14/2011   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    Orientation RESPIRATION BLADDER Height & Weight     Self, Time, Situation, Place  Normal External catheter, Incontinent Weight: 149 lb 14.6 oz (68 kg) Height:  5\' 1"  (154.9 cm)  BEHAVIORAL SYMPTOMS/MOOD NEUROLOGICAL BOWEL NUTRITION STATUS      Continent Diet  AMBULATORY STATUS COMMUNICATION OF NEEDS Skin   Limited Assist Verbally Normal                       Personal Care Assistance Level of Assistance  Bathing, Feeding, Dressing Bathing Assistance: Limited assistance Feeding assistance: Limited assistance Dressing Assistance: Limited assistance     Functional Limitations Info  Sight, Hearing, Speech Sight Info: Adequate Hearing Info: Impaired Speech Info: Adequate    SPECIAL CARE FACTORS FREQUENCY  PT (By licensed PT), OT (By licensed OT)     PT Frequency: 5 times weekly  OT Frequency: 5 times weekly            Contractures Contractures Info: Not present    Additional Factors Info  Code Status, Allergies Code Status Info: FULL Allergies Info: Asa (Aspirin), Macrobid (Nitrofurantoin)           Current Medications (04/08/2023):  This is the current  hospital active medication list Current Facility-Administered Medications  Medication Dose Route Frequency Provider Last Rate Last Admin   0.9 %  sodium chloride infusion  250 mL Intravenous Continuous Adefeso, Oladapo, DO       Chlorhexidine Gluconate Cloth 2 % PADS 6 each  6 each Topical Daily Adefeso, Oladapo, DO   6 each at 04/08/23 1126   enoxaparin (LOVENOX) injection 40 mg  40 mg Subcutaneous Q24H Adefeso, Oladapo, DO   40 mg at 04/07/23 2134   levothyroxine (SYNTHROID) tablet 50 mcg  50 mcg Oral Daily Adefeso, Oladapo, DO   50 mcg at 04/07/23 0806   midodrine (PROAMATINE) tablet 5 mg  5 mg Oral TID WC Vassie Loll, MD   5 mg at 04/08/23 1137   norepinephrine (LEVOPHED) 4mg  in (0.016 mg/mL) premix infusion  2-10 mcg/min Intravenous Titrated Adefeso, Oladapo, DO 7.5 mL/hr at 04/08/23 1243 2 mcg/min at 04/08/23 1243   ondansetron (ZOFRAN) injection 4 mg  4 mg Intravenous Q6H PRN Adefeso, Oladapo, DO       pantoprazole (PROTONIX) injection 40 mg  40 mg Intravenous Q24H Adefeso, Oladapo, DO   40 mg at 04/08/23 1138   potassium PHOSPHATE 30 mmol in dextrose 5 % 500 mL infusion  30 mmol Intravenous Once Adefeso, Oladapo, DO 85 mL/hr at 04/08/23 1130 30 mmol at 04/08/23 1130     Discharge Medications: Please see discharge summary for a list of discharge medications.  Relevant Imaging Results:  Relevant Lab Results:   Additional Information SSN: [id number], [place]

## 2023-04-08 NOTE — Progress Notes (Signed)
Physical Therapy Treatment Patient Details Name: Kathy [name] MRN: [id number] DOB: April 27, 1941 Today's Date: 04/08/2023   History of Present Illness Kathy [name] is a 82 y.o. 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  Patient states she slid out of bed and fell on the floor, but was unable to get up.  A friend of hers who was unable to reach her on the phone stopped by to check on her and saw her on the floor, EMS was activated and patient was sent to the ED for further evaluation and management.  Patient endorsed generalized weakness that has been ongoing for a few days, she has been having difficulty in ambulation due to weakness.  Patient ambulates with a walker at baseline and she lives alone.  Patient was recently admitted from 8/9 to 03/16/2023 due to persistent hypercalcemia, generalized weakness and deconditioning with history of falls, she was evaluated by PT and SNF rehab was recommended.  She was also admitted from 6/23 to 01/31/2023 due to hypercalcemia    PT Comments  Patient has difficulty sitting up bedside mostly due to increasing low back pain, once seated able to keep trunk in midline without falling backwards, demonstrates fair/good return for completing BLE exercises with verbal cues, required Mod/max assist for completing sit to stands and limited to a few steps at bedside before having to sit due to fatigue/generalized weakness.  Patient tolerated sitting up in chair after therapy - RN aware.  Patient will benefit from continued skilled physical therapy in hospital and recommended venue below to increase strength, balance, endurance for safe ADLs and gait.      If plan is discharge home, recommend the following: A lot of help with bathing/dressing/bathroom;A lot of help with walking and/or  transfers;Help with stairs or ramp for entrance;Assistance with cooking/housework   Can travel by private vehicle     No  Equipment Recommendations  None recommended by PT    Recommendations for Other Services       Precautions / Restrictions Precautions Precautions: Fall Restrictions Weight Bearing Restrictions: No     Mobility  Bed Mobility Overal bed mobility: Needs Assistance Bed Mobility: Supine to Sit Rolling: Mod assist, Max assist   Supine to sit: Max assist     General bed mobility comments: increased time, labored movement, c/o severe low back pain    Transfers Overall transfer level: Needs assistance Equipment used: Rolling walker (2 wheels) Transfers: Sit to/from Stand, Bed to chair/wheelchair/BSC Sit to Stand: Mod assist, Max assist   Step pivot transfers: Mod assist       General transfer comment: had most difficulty with sit to stands due to BLE weakness    Ambulation/Gait Ambulation/Gait assistance: Mod assist, Max assist Gait Distance (Feet): 4 Feet Assistive device: Rolling walker (2 wheels) Gait Pattern/deviations: Decreased step length - right, Decreased step length - left, Decreased stance time - right, Decreased stride length, Shuffle Gait velocity: slow     General Gait Details: able to take a few slow labored side stes and a few steps backwards before having to sit due to fatigue/generalized weakness   Stairs             Wheelchair Mobility     Tilt Bed    Modified Rankin (Stroke Patients Only)       Balance Overall balance assessment: Needs assistance  Sitting-balance support: Feet unsupported, No upper extremity supported Sitting balance-Leahy Scale: Fair Sitting balance - Comments: seated at EOB   Standing balance support: Reliant on assistive device for balance, During functional activity, Bilateral upper extremity supported Standing balance-Leahy Scale: Poor Standing balance comment: fair/poor using RW                             Cognition Arousal: Alert Behavior During Therapy: WFL for tasks assessed/performed Overall Cognitive Status: Within Functional Limits for tasks assessed                                          Exercises General Exercises - Lower Extremity Long Arc Quad: Seated, AROM, Strengthening, Both, 10 reps Hip Flexion/Marching: Seated, AROM, Strengthening, Both, 10 reps Toe Raises: Seated, AROM, Strengthening, Both, 10 reps Heel Raises: Seated, AROM, Strengthening, Both, 10 reps    General Comments        Pertinent Vitals/Pain Pain Assessment Pain Assessment: Faces Faces Pain Scale: Hurts even more Pain Location: low back Pain Descriptors / Indicators: Sore, Sharp, Grimacing Pain Intervention(s): Limited activity within patient's tolerance, Monitored during session, Repositioned    Home Living                          Prior Function            PT Goals (current goals can now be found in the care plan section) Acute Rehab PT Goals Patient Stated Goal: return home after rehab PT Goal Formulation: With patient Time For Goal Achievement: 04/21/23 Potential to Achieve Goals: Good Progress towards PT goals: Progressing toward goals    Frequency    Min 3X/week      PT Plan      Co-evaluation              AM-PAC PT "6 Clicks" Mobility   Outcome Measure  Help needed turning from your back to your side while in a flat bed without using bedrails?: A Lot Help needed moving from lying on your back to sitting on the side of a flat bed without using bedrails?: A Lot Help needed moving to and from a bed to a chair (including a wheelchair)?: A Lot Help needed standing up from a chair using your arms (e.g., wheelchair or bedside chair)?: A Lot Help needed to walk in hospital room?: A Lot Help needed climbing 3-5 steps with a railing? : Total 6 Click Score: 11    End of Session   Activity Tolerance: Patient  tolerated treatment well;Patient limited by fatigue Patient left: in chair;with call bell/phone within reach Nurse Communication: Mobility status PT Visit Diagnosis: Other abnormalities of gait and mobility (R26.89);Muscle weakness (generalized) (M62.81);Unsteadiness on feet (R26.81)     Time: 1610-9604 PT Time Calculation (min) (ACUTE ONLY): 27 min  Charges:    $Therapeutic Exercise: 8-22 mins $Therapeutic Activity: 8-22 mins PT General Charges $$ ACUTE PT VISIT: 1 Visit                     3:44 PM, 04/08/23 [name], MPT Physical Therapist with [facility] [phone] office [phone] mobile phone

## 2023-04-08 NOTE — Care Management Important Message (Signed)
Important Message  Patient Details  Name: Mckinsley Gonce Masini MRN: [id number] Date of Birth: Mar 22, 1941   Medicare Important Message Given:  N/A - LOS <3 / Initial given by admissions     Corey Harold 04/08/2023, 3:20 PM

## 2023-04-09 DIAGNOSIS — R5381 Other malaise: Secondary | ICD-10-CM | POA: Diagnosis not present

## 2023-04-09 DIAGNOSIS — E8809 Other disorders of plasma-protein metabolism, not elsewhere classified: Secondary | ICD-10-CM | POA: Diagnosis not present

## 2023-04-09 DIAGNOSIS — Z85850 Personal history of malignant neoplasm of thyroid: Secondary | ICD-10-CM | POA: Diagnosis not present

## 2023-04-09 LAB — IGG, IGA, IGM
IgA: 17 mg/dL — ABNORMAL LOW (ref 64–422)
IgG (Immunoglobin G), Serum: 5371 mg/dL — ABNORMAL HIGH (ref 586–1602)
IgM (Immunoglobulin M), Srm: 8 mg/dL — ABNORMAL LOW (ref 26–217)

## 2023-04-09 LAB — BETA 2 MICROGLOBULIN, SERUM: Beta-2 Microglobulin: 3.6 mg/L — ABNORMAL HIGH (ref 0.6–2.4)

## 2023-04-09 MED ORDER — MIDODRINE HCL 5 MG PO TABS
7.5000 mg | ORAL_TABLET | Freq: Three times a day (TID) | ORAL | Status: DC
  Administered 2023-04-09 – 2023-04-12 (×9): 7.5 mg via ORAL
  Filled 2023-04-09 (×9): qty 2

## 2023-04-09 NOTE — Plan of Care (Signed)

## 2023-04-09 NOTE — Progress Notes (Signed)
Progress Note   Patient: Kathy [name] Nibert NWG:[id number] DOB: 1941-01-07 DOA: 04/06/2023     2 DOS: the patient was seen and examined on 04/09/2023   Brief hospital admission narrative: As per H&P written by Dr. Thomes Dinning on 04/07/2023 Kathy [name] is a 82 y.o. 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  Patient states she slid out of bed and fell on the floor, but was unable to get up.  A friend of hers who was unable to reach her on the phone stopped by to check on her and saw her on the floor, EMS was activated and patient was sent to the ED for further evaluation and management.  Patient endorsed generalized weakness that has been ongoing for a few days, she has been having difficulty in ambulation due to weakness.  Patient ambulates with a walker at baseline and she lives alone. Patient was recently admitted from 8/9 to 03/16/2023 due to persistent hypercalcemia, generalized weakness and deconditioning with history of falls, she was evaluated by PT and SNF rehab was recommended. She was also admitted from 6/23 to 01/31/2023 due to hypercalcemia   ED Course:  In the emergency department, BP on arrival was 96/61, temperature was 97.5, respiration 22/min, pulse 76 bpm, O2 sat 98% on room air.  Workup in the ED showed macrocytic anemia.  BMP was normal except for bicarb of 20, BUN 26/1.27 (baseline at 1.1-1.3).  Albumin < 1.5, troponin 416 > 315, lactic acid 2.2 > 2.0, lipase 306, urinalysis was normal.  SARS coronavirus 2 was negative, blood culture pending CT abdomen pelvis showed stranding/edema around the pancreas suggesting acute pancreatitis. CT head without contrast showed no acute ventricular abnormality Chest x-ray showed no acute cardiopulmonary process CT lumbar spine without contrast showed no acute  displaced fracture or traumatic listhesis of the lumbar spine. IV cefepime and metronidazole were given due to initial thoughts of intra-abdominal infection.  IV hydration was provided.  Percocet 5/325 mg x 1 was given.  IV Levophed was started due to hypotension despite IV hydration. Hospitalist was asked to admit patient for further evaluation and management.  Assessment and Plan: Hypovolemic shock/hypotension -In the setting of severe dehydration and poor oral intake -Patient failed to improved with fluid resuscitation alone and require the use of vasopressors -Continue to wean off Levophed -Continue to maintain adequate hydration. -Follow vital signs trend. -Continue the use of midodrine; dose adjusted to hopefully achieve completely weaned off of pressors support.  Acute kidney injury on chronic kidney disease stage II -Associated with prerenal azotemia and underlying diagnosis of multiple myeloma -Continue minimizing nephrotoxic agents -Continue to maintain fluid resuscitation -Avoid the use of contrast and hypotension -Follow renal function trend.  Currently back to baseline. -Patient with chronic kidney disease stage II based on GFR.  Hypercalcemia -Recurrent and in the setting of multiple myeloma -Continue to maintain adequate hydration -Patient is status post Zometa -Continue to follow calcium levels. -Oncology service on board and will follow further recommendations and care -Status post bone marrow biopsy 04/08/2023.  Multiple myeloma -Oncology on board; will follow further recommendations. -Pending treatment initiation. -Status post bone marrow biopsy on 04/08/2023 by IR  Hypoalbuminemia secondary to severe protein calorie malnutrition -IV albumin x 1 given at time of admission. -Continue feeding supplements -Continue supportive care and encourage better oral intake/nutrition.  Dysphagia -Appreciate assistance and  recommendation by speech therapy -Patient's diet has  been adjusted to regular diet with thin liquids.  Gastroesophageal flux disease -Continue PPI. -As needed antiemetics available.  History of thyroid cancer -Continue outpatient follow-up with endocrinology service -Continue Synthroid. -Status post thyroidectomy.  Anemia of chronic disease -Hemoglobin 8.2 -No overt bleeding appreciated on exam. -Appears to be secondary to underlying multiple myeloma -Continue to follow hemoglobin trend and transfuse as needed.  Subjective:  Chronically ill in appearance; underweight and deconditioned.  No chest pain, no nausea, no vomiting.  Blood pressure still soft and requiring Levophed.  Physical Exam: Vitals:   04/09/23 1715 04/09/23 1730 04/09/23 1745 04/09/23 1800  BP: 106/86 (!) 113/55 113/72 (!) 109/51  Pulse: 91 98 76 (!) 57  Resp: 15 (!) 21 19 17   Temp:      TempSrc:      SpO2:    100%  Weight:      Height:       General exam: Alert, awake, oriented x 3; chronically ill in appearance; no chest pain, no nausea or vomiting. Respiratory system: Good air movement bilaterally; good saturation on room air. Cardiovascular system:RRR. No rubs or gallops; no JVD. Gastrointestinal system: Abdomen is nondistended, soft and nontender. No organomegaly or masses felt. Normal bowel sounds heard. Central nervous system: Generally weak; moving 4 limbs spontaneously.  No focal neurological deficits. Extremities: No cyanosis or clubbing. Skin: No petechiae. Psychiatry: Judgement and insight appear normal. Mood & affect appropriate.   Latest Data Reviewed: Renal function panel: Sodium 134, potassium 3.8, chloride 104, bicarb 23, BUN 16, creatinine 0.89, calcium 11.9 and albumin less than 1.5 Magnesium: 1.7 CBC: WBCs 4.8, hemoglobin 8.2 and platelet count 169K LDH: 200 IgG: 5000 371 IgA 17 IgM 8 Beta-2 microglobulin 3.6 Protein electrophoresis: Pending   Family Communication: No family at bedside.  Disposition: Status is:  Inpatient Remains inpatient appropriate because: Continue treatment with IV fluids, weaning of pressure support and follow recommendation by oncology service regarding treatment initiation for multiple myeloma.  Status post bone marrow biopsy 04/08/2023.   Planned Discharge Destination: Skilled nursing facility  CRITICAL CARE Performed by: Vassie Loll   Total critical care time: 60 minutes  Critical care time was exclusive of separately billable procedures and treating other patients.  Critical care was necessary to treat or prevent imminent or life-threatening deterioration.  Critical care was time spent personally by me on the following activities: development of treatment plan with patient and/or surrogate as well as nursing, discussions with consultants, evaluation of patient's response to treatment, examination of patient, obtaining history from patient or surrogate, ordering and performing treatments and interventions, ordering and review of laboratory studies, ordering and review of radiographic studies, pulse oximetry and re-evaluation of patient's condition.    Author: Vassie Loll, MD 04/09/2023 6:34 PM  For on call review [email or web address].

## 2023-04-10 LAB — BASIC METABOLIC PANEL
Anion gap: 5 (ref 5–15)
Anion gap: 6 (ref 5–15)
BUN: 9 mg/dL (ref 8–23)
BUN: 10 mg/dL (ref 8–23)
CO2: 23 mmol/L (ref 22–32)
CO2: 21 mmol/L — ABNORMAL LOW (ref 22–32)
Calcium: 10.2 mg/dL (ref 8.9–10.3)
Calcium: 9.9 mg/dL (ref 8.9–10.3)
Chloride: 105 mmol/L (ref 98–111)
Chloride: 104 mmol/L (ref 98–111)
Creatinine, Ser: 0.77 mg/dL (ref 0.44–1.00)
Creatinine, Ser: 0.82 mg/dL (ref 0.44–1.00)
GFR, Estimated: >60 mL/min (ref >60)
GFR, Estimated: >60 mL/min (ref >60)
Glucose, Bld: 88 mg/dL (ref 70–99)
Glucose, Bld: 95 mg/dL (ref 70–99)
Potassium: 3.5 mmol/L (ref 3.5–5.1)
Potassium: 3.9 mmol/L (ref 3.5–5.1)
Sodium: 133 mmol/L — ABNORMAL LOW (ref 135–145)
Sodium: 131 mmol/L — ABNORMAL LOW (ref 135–145)

## 2023-04-10 LAB — CBC
HCT: 24 % — ABNORMAL LOW (ref 36.0–46.0)
Hemoglobin: 7.8 g/dL — ABNORMAL LOW (ref 12.0–15.0)
MCH: 33.3 pg (ref 26.0–34.0)
MCHC: 32.5 g/dL (ref 30.0–36.0)
MCV: 102.6 fL — ABNORMAL HIGH (ref 80.0–100.0)
Platelets: 172 10*3/uL (ref 150–400)
RBC: 2.34 MIL/uL — ABNORMAL LOW (ref 3.87–5.11)
RDW: 18.7 % — ABNORMAL HIGH (ref 11.5–15.5)
WBC: 5 10*3/uL (ref 4.0–10.5)
nRBC: 1 % — ABNORMAL HIGH (ref 0.0–0.2)

## 2023-04-10 LAB — PHOSPHORUS
Phosphorus: <1 mg/dL — CL (ref 2.5–4.6)
Phosphorus: 2.2 mg/dL — ABNORMAL LOW (ref 2.5–4.6)

## 2023-04-10 LAB — MAGNESIUM
Magnesium: 1.4 mg/dL — ABNORMAL LOW (ref 1.7–2.4)
Magnesium: 2.4 mg/dL (ref 1.7–2.4)

## 2023-04-10 MED ORDER — ORAL CARE MOUTH RINSE: 15.0000 mL | OROMUCOSAL | Status: DC | PRN

## 2023-04-10 MED ORDER — POTASSIUM PHOSPHATES 15 MMOLE/5ML IV SOLN
30.0000 mmol | Freq: Once | INTRAVENOUS | Status: AC
  Administered 2023-04-10: 30 mmol via INTRAVENOUS
  Filled 2023-04-10: qty 10

## 2023-04-10 MED ORDER — MAGNESIUM SULFATE 4 GM/100ML IV SOLN
4.0000 g | Freq: Once | INTRAVENOUS | Status: AC
  Administered 2023-04-10: 4 g via INTRAVENOUS
  Filled 2023-04-10: qty 100

## 2023-04-10 NOTE — Progress Notes (Signed)
PROGRESS NOTE    Kathy [name]  BJY:[id number] DOB: September 05, 1940 DOA: 04/06/2023 PCP: [name], MD   Brief Narrative:    Kathy [name] is a 82 y.o. 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  Patient states she slid out of bed and fell on the floor, but was unable to get up.  Patient was admitted with hypovolemic shock/type of tension in the setting of severe dehydration and poor oral intake as well as associated prerenal AKI and hypercalcemia in the setting of multiple myeloma.  Assessment & Plan:   Principal Problem:   Hypercalcemia of malignancy Active Problems:   Essential hypertension   Acquired hypothyroidism   Physical deconditioning   General weakness   Recurrent falls   Hypotension   Hypoalbuminemia due to protein-calorie malnutrition (HCC)   Chronic kidney disease, stage 3b (HCC)   Dysphagia   Acute pancreatitis   History of thyroid cancer  Assessment and Plan:  Hypovolemic shock/hypotension-resolved -In the setting of severe dehydration and poor oral intake -Patient failed to improved with fluid resuscitation alone and require the use of vasopressors -Continue midodrine -Now weaned off of Levophed and may transfer to regular floor   Acute kidney injury on chronic kidney disease stage II -Associated with prerenal azotemia and underlying diagnosis of multiple myeloma -Continue minimizing nephrotoxic agents -Continue to maintain fluid resuscitation -Avoid the use of contrast and hypotension -Follow renal function trend.  Currently back to baseline. -Patient with chronic kidney disease stage II based on GFR.   Hypercalcemia-resolved -Recurrent and in the setting of multiple myeloma -Continue to maintain adequate hydration -Patient is status post Zometa -Continue  to follow calcium levels. -Oncology service on board and will follow further recommendations and care -Status post bone marrow biopsy 04/08/2023.   Multiple myeloma -Oncology on board; will follow further recommendations. -Pending treatment initiation. -Status post bone marrow biopsy on 04/08/2023 by IR   Hypoalbuminemia secondary to severe protein calorie malnutrition -IV albumin x 1 given at time of admission. -Continue feeding supplements -Continue supportive care and encourage better oral intake/nutrition.   Dysphagia -Appreciate assistance and recommendation by speech therapy -Patient's diet has been adjusted to regular diet with thin liquids.   Gastroesophageal flux disease -Continue PPI. -As needed antiemetics available.   History of thyroid cancer -Continue outpatient follow-up with endocrinology service -Continue Synthroid. -Status post thyroidectomy.   Anemia of chronic disease -Hemoglobin 7.8 -No overt bleeding appreciated on exam. -Appears to be secondary to underlying multiple myeloma -Continue to follow hemoglobin trend and transfuse as needed.   DVT prophylaxis:Lovenox Code Status: Full Family Communication: Brother [name] Meuser on phone 9/8 Disposition Plan:  Status is: Inpatient Remains inpatient appropriate because: Need for IV medications.  Consultants:  Oncology  Procedures:  None  Antimicrobials:  None   Subjective: Patient seen and evaluated today with no new acute complaints or concerns. No acute concerns or events noted overnight.  Blood pressures have improved and patient is no longer requiring pressor support.  Objective: Vitals:   04/10/23 1115 04/10/23 1145 04/10/23 1154 04/10/23 1200  BP:   (!) 126/56 110/60  Pulse:  94    Resp: 19   20  Temp:   98.3 F (36.8 C)   TempSrc:   Oral   SpO2:  96%    Weight:      Height:  Intake/Output Summary (Last 24 hours) at 04/10/2023 1210 Last data filed at 04/10/2023 0454 Gross per 24  hour  Intake 611.06 ml  Output 1475 ml  Net -863.94 ml   Filed Weights   04/06/23 1605 04/09/23 0627 04/10/23 0623  Weight: 68 kg 69.5 kg 70.3 kg    Examination:  General exam: Appears calm and comfortable  Respiratory system: Clear to auscultation. Respiratory effort normal. Cardiovascular system: S1 & S2 heard, RRR.  Gastrointestinal system: Abdomen is soft Central nervous system: Alert and awake Extremities: No edema Skin: No significant lesions noted Psychiatry: Flat affect.    Data Reviewed: I have personally reviewed following labs and imaging studies  CBC: Recent Labs  Lab 04/06/23 1705 04/07/23 0715 04/08/23 0444 04/10/23 0354  WBC 6.5 6.1 4.8 5.0  NEUTROABS 4.3  --  2.9  --   HGB 9.2* 8.0* 8.2* 7.8*  HCT 28.5* 25.0* 25.7* 24.0*  MCV 102.5* 104.2* 104.0* 102.6*  PLT 190 167 169 172   Basic Metabolic Panel: Recent Labs  Lab 04/06/23 2041 04/07/23 0715 04/08/23 0444 04/10/23 0354 04/10/23 0420  NA 136 135 134* 133*  --   K 4.3 4.0 3.8 3.5  --   CL 107 106 104 105  --   CO2 20* 23 23 23   --   GLUCOSE 77 78 74 88  --   BUN 26* 24* 16 9  --   CREATININE 1.27* 1.11* 0.89 0.77  --   CALCIUM 12.4* 11.3* 11.9* 10.2  --   MG  --  1.7 1.7  --  1.4*  PHOS  --  1.9* 1.4*  --  <1.0*   GFR: Estimated Creatinine Clearance: 50.3 mL/min (by C-G formula based on SCr of 0.77 mg/dL). Liver Function Tests: Recent Labs  Lab 04/06/23 2041 04/07/23 0715 04/08/23 0444  AST 77* 78*  --   ALT 28 29  --   ALKPHOS 60 55  --   BILITOT 0.5 0.5  --   PROT 9.2* 8.4*  --   ALBUMIN <1.5* <1.5* <1.5*   Recent Labs  Lab 04/06/23 2041  LIPASE 306*   No results for input(s): "AMMONIA" in the last 168 hours. Coagulation Profile: Recent Labs  Lab 04/06/23 2041  INR 1.6*   Cardiac Enzymes: No results for input(s): "CKTOTAL", "CKMB", "CKMBINDEX", "TROPONINI" in the last 168 hours. BNP (last 3 results) No results for input(s): "PROBNP" in the last 8760  hours. HbA1C: No results for input(s): "HGBA1C" in the last 72 hours. CBG: No results for input(s): "GLUCAP" in the last 168 hours. Lipid Profile: No results for input(s): "CHOL", "HDL", "LDLCALC", "TRIG", "CHOLHDL", "LDLDIRECT" in the last 72 hours. Thyroid Function Tests: No results for input(s): "TSH", "T4TOTAL", "FREET4", "T3FREE", "THYROIDAB" in the last 72 hours. Anemia Panel: No results for input(s): "VITAMINB12", "FOLATE", "FERRITIN", "TIBC", "IRON", "RETICCTPCT" in the last 72 hours. Sepsis Labs: Recent Labs  Lab 04/06/23 1750 04/06/23 2041  LATICACIDVEN 2.2* 2.0*    Recent Results (from the past 240 hour(s))  SARS Coronavirus 2 by RT PCR (hospital order, performed in [facility] hospital lab) *cepheid single result test* Anterior Nasal Swab     Status: None   Collection Time: 04/06/23  4:56 PM   Specimen: Anterior Nasal Swab  Result Value Ref Range Status   SARS Coronavirus 2 by RT PCR NEGATIVE NEGATIVE Final    Comment: (NOTE) SARS-CoV-2 target nucleic acids are NOT DETECTED.  The SARS-CoV-2 RNA is generally detectable in upper and lower respiratory specimens during  the acute phase of infection. The lowest concentration of SARS-CoV-2 viral copies this assay can detect is 250 copies / mL. A negative result does not preclude SARS-CoV-2 infection and should not be used as the sole basis for treatment or other patient management decisions.  A negative result may occur with improper specimen collection / handling, submission of specimen other than nasopharyngeal swab, presence of viral mutation(s) within the areas targeted by this assay, and inadequate number of viral copies (<250 copies / mL). A negative result must be combined with clinical observations, patient history, and epidemiological information.  Fact Sheet for Patients:   RoadLapTop.co.za  Fact Sheet for Healthcare Providers: http://kim-miller.com/  This test  is not yet approved or  cleared by the [place] FDA and has been authorized for detection and/or diagnosis of SARS-CoV-2 by FDA under an Emergency Use Authorization (EUA).  This EUA will remain in effect (meaning this test can be used) for the duration of the COVID-19 declaration under Section 564(b)(1) of the Act, 21 U.S.C. section 360bbb-3(b)(1), unless the authorization is terminated or revoked sooner.  Performed at [facility], [address], [place], [place] [address]   Culture, blood (single)     Status: None (Preliminary result)   Collection Time: 04/07/23  1:02 AM   Specimen: BLOOD RIGHT HAND  Result Value Ref Range Status   Specimen Description BLOOD RIGHT HAND  Final   Special Requests   Final    BOTTLES DRAWN AEROBIC AND ANAEROBIC Blood Culture adequate volume   Culture   Final    NO GROWTH 3 DAYS Performed at [facility], [address] [place], [place] [address]    Report Status PENDING  Incomplete  MRSA Next Gen by PCR, Nasal     Status: None   Collection Time: 04/07/23  7:18 AM   Specimen: Nasal Mucosa; Nasal Swab  Result Value Ref Range Status   MRSA by PCR Next Gen NOT DETECTED NOT DETECTED Final    Comment: (NOTE) The GeneXpert MRSA Assay (FDA approved for NASAL specimens only), is one component of a comprehensive MRSA colonization surveillance program. It is not intended to diagnose MRSA infection nor to guide or monitor treatment for MRSA infections. Test performance is not FDA approved in patients less than 4 years old. Performed at [facility], [address] [place], [place] [address]          Radiology Studies: No results found.      Scheduled Meds:  Chlorhexidine Gluconate Cloth  6 each Topical Daily   enoxaparin (LOVENOX) injection  40 mg Subcutaneous Q24H   levothyroxine  50 mcg Oral Daily   midodrine  7.5 mg Oral TID WC   pantoprazole (PROTONIX) IV  40 mg Intravenous Q24H   Continuous Infusions:  sodium chloride      magnesium sulfate bolus IVPB 4 g (04/10/23 1100)   norepinephrine (LEVOPHED) Adult infusion 1 mcg/min (04/10/23 0454)   potassium PHOSPHATE IVPB (in mmol) 30 mmol (04/10/23 1202)     LOS: 3 days    Time spent: 35 minutes    Geralynn Capri Hoover Brunette, DO [facility]  If 7PM-7AM, please contact night-coverage [email or web address] 04/10/2023, 12:10 PM

## 2023-04-10 NOTE — TOC Progression Note (Signed)
Transition of Care [facility]) - Progression Note    Patient Details  Name: Kathy [name] MRN: [id number] Date of Birth: 01-Jul-1941  Transition of Care [facility]) CM/SW Contact  Leitha Bleak, RN Phone Number: 04/10/2023, 4:20 PM  Clinical Narrative:   CM spoke with patient to discuss bed offers. She is agreeable to [facility]. INS Auth started [place] plan number [id number]. TOC following.    Expected Discharge Plan: Skilled Nursing Facility Barriers to Discharge: Continued Medical Work up, English as a second language teacher  Expected Discharge Plan and Services      Living arrangements for the past 2 months: Single Family Home                   Social Determinants of Health (SDOH) Interventions SDOH Screenings   Food Insecurity: No Food Insecurity (04/07/2023)  Housing: Low Risk  (04/07/2023)  Transportation Needs: No Transportation Needs (04/07/2023)  Utilities: Not At Risk (04/07/2023)  Alcohol Screen: Low Risk  (09/14/2021)  Depression (PHQ2-9): Low Risk  (03/30/2023)  Financial Resource Strain: Low Risk  (09/14/2021)  Physical Activity: Sufficiently Active (09/14/2021)  Social Connections: Moderately Isolated (09/14/2021)  Stress: No Stress Concern Present (09/14/2021)  Tobacco Use: Low Risk  (04/06/2023)    Readmission Risk Interventions    03/16/2023   10:32 AM  Readmission Risk Prevention Plan  Transportation Screening Complete  PCP or Specialist Appt within 3-5 Days Complete  HRI or Home Care Consult Complete  Social Work Consult for Recovery Care Planning/Counseling Complete  Palliative Care Screening Not Applicable  Medication Review Oceanographer) Complete

## 2023-04-10 NOTE — Progress Notes (Signed)
Physical Therapy Treatment Patient Details Name: Kathy [name] MRN: [id number] DOB: 12-09-1940 Today's Date: 04/10/2023   History of Present Illness Kathy [name] is a 82 y.o. 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  Patient states she slid out of bed and fell on the floor, but was unable to get up.  A friend of hers who was unable to reach her on the phone stopped by to check on her and saw her on the floor, EMS was activated and patient was sent to the ED for further evaluation and management.  Patient endorsed generalized weakness that has been ongoing for a few days, she has been having difficulty in ambulation due to weakness.  Patient ambulates with a walker at baseline and she lives alone.  Patient was recently admitted from 8/9 to 03/16/2023 due to persistent hypercalcemia, generalized weakness and deconditioning with history of falls, she was evaluated by PT and SNF rehab was recommended.  She was also admitted from 6/23 to 01/31/2023 due to hypercalcemia    PT Comments  Patient presents seated (assisted by nursing staff) and agreeable for therapy.  Patient demonstrates fair/good return for completing BLE ROM/strengthening exercises while seated at edge of chair, able to take few steps forward/backwards at bedside with shuffling of LLE and required occasional tactile assistance for moving feet due to weakness.  Patient tolerated standing up to 4-5 minutes and tolerated staying up in chair after therapy.  Patient will benefit from continued skilled physical therapy in hospital and recommended venue below to increase strength, balance, endurance for safe ADLs and gait.      If plan is discharge home, recommend the following: A lot of help with bathing/dressing/bathroom;A lot of help with walking and/or  transfers;Help with stairs or ramp for entrance;Assistance with cooking/housework   Can travel by private vehicle     No  Equipment Recommendations  None recommended by PT    Recommendations for Other Services       Precautions / Restrictions Precautions Precautions: Fall Restrictions Weight Bearing Restrictions: No     Mobility  Bed Mobility               General bed mobility comments: Patient presents up in chair (assisted by nursing staff)    Transfers Overall transfer level: Needs assistance Equipment used: Rolling walker (2 wheels) Transfers: Sit to/from Stand, Bed to chair/wheelchair/BSC Sit to Stand: Mod assist, Max assist   Step pivot transfers: Mod assist       General transfer comment: increased time, labored movement    Ambulation/Gait Ambulation/Gait assistance: Mod assist, Max assist Gait Distance (Feet): 4 Feet Assistive device: Rolling walker (2 wheels) Gait Pattern/deviations: Decreased step length - right, Decreased step length - left, Decreased stance time - right, Decreased stride length, Shuffle Gait velocity: slow     General Gait Details: patient able to take a few steps forward/backward with shuffling on LLE due to weakness   Stairs             Wheelchair Mobility     Tilt Bed    Modified Rankin (Stroke Patients Only)       Balance Overall balance assessment: Needs assistance Sitting-balance support: Feet supported, No upper extremity supported Sitting balance-Leahy Scale: Fair Sitting balance - Comments: seated edge of chair Postural control: Posterior lean Standing balance support: Reliant on assistive  device for balance, During functional activity, Bilateral upper extremity supported Standing balance-Leahy Scale: Poor Standing balance comment: fair/poor using RW                            Cognition Arousal: Alert Behavior During Therapy: WFL for tasks assessed/performed Overall Cognitive Status:  Within Functional Limits for tasks assessed                                          Exercises General Exercises - Lower Extremity Long Arc Quad: Seated, AROM, Strengthening, Both, 10 reps Hip Flexion/Marching: Seated, AROM, Strengthening, Both, 10 reps Toe Raises: Seated, AROM, Strengthening, Both, 10 reps Heel Raises: Seated, AROM, Strengthening, Both, 10 reps    General Comments        Pertinent Vitals/Pain Pain Assessment Pain Assessment: No/denies pain    Home Living                          Prior Function            PT Goals (current goals can now be found in the care plan section) Acute Rehab PT Goals Patient Stated Goal: return home after rehab PT Goal Formulation: With patient Time For Goal Achievement: 04/21/23 Potential to Achieve Goals: Good Progress towards PT goals: Progressing toward goals    Frequency    Min 3X/week      PT Plan      Co-evaluation              AM-PAC PT "6 Clicks" Mobility   Outcome Measure  Help needed turning from your back to your side while in a flat bed without using bedrails?: A Lot Help needed moving from lying on your back to sitting on the side of a flat bed without using bedrails?: A Lot Help needed moving to and from a bed to a chair (including a wheelchair)?: A Lot Help needed standing up from a chair using your arms (e.g., wheelchair or bedside chair)?: A Lot Help needed to walk in hospital room?: A Lot Help needed climbing 3-5 steps with a railing? : Total 6 Click Score: 11    End of Session   Activity Tolerance: Patient tolerated treatment well;Patient limited by fatigue Patient left: in chair;with call bell/phone within reach Nurse Communication: Mobility status PT Visit Diagnosis: Other abnormalities of gait and mobility (R26.89);Muscle weakness (generalized) (M62.81);Unsteadiness on feet (R26.81)     Time: 5956-3875 PT Time Calculation (min) (ACUTE ONLY): 22  min  Charges:    $Therapeutic Exercise: 8-22 mins $Therapeutic Activity: 8-22 mins PT General Charges $$ ACUTE PT VISIT: 1 Visit                     1:08 PM, 04/10/23 [name], MPT Physical Therapist with [facility] [phone] office [phone] mobile phone

## 2023-04-10 NOTE — Plan of Care (Signed)

## 2023-04-11 ENCOUNTER — Other Ambulatory Visit: Payer: Self-pay

## 2023-04-11 ENCOUNTER — Other Ambulatory Visit (HOSPITAL_COMMUNITY): Payer: Self-pay

## 2023-04-11 LAB — COMPREHENSIVE METABOLIC PANEL
ALT: 21 U/L (ref 0–44)
AST: 25 U/L (ref 15–41)
Albumin: <1.5 g/dL — ABNORMAL LOW (ref 3.5–5.0)
Alkaline Phosphatase: 60 U/L (ref 38–126)
Anion gap: 7 (ref 5–15)
BUN: 9 mg/dL (ref 8–23)
CO2: 23 mmol/L (ref 22–32)
Calcium: 9.7 mg/dL (ref 8.9–10.3)
Chloride: 104 mmol/L (ref 98–111)
Creatinine, Ser: 0.79 mg/dL (ref 0.44–1.00)
GFR, Estimated: >60 mL/min (ref >60)
Glucose, Bld: 82 mg/dL (ref 70–99)
Potassium: 3.6 mmol/L (ref 3.5–5.1)
Sodium: 134 mmol/L — ABNORMAL LOW (ref 135–145)
Total Bilirubin: 0.4 mg/dL (ref 0.3–1.2)
Total Protein: 7.5 g/dL (ref 6.5–8.1)

## 2023-04-11 LAB — CBC
HCT: 24.4 % — ABNORMAL LOW (ref 36.0–46.0)
Hemoglobin: 8 g/dL — ABNORMAL LOW (ref 12.0–15.0)
MCH: 33.9 pg (ref 26.0–34.0)
MCHC: 32.8 g/dL (ref 30.0–36.0)
MCV: 103.4 fL — ABNORMAL HIGH (ref 80.0–100.0)
Platelets: 165 10*3/uL (ref 150–400)
RBC: 2.36 MIL/uL — ABNORMAL LOW (ref 3.87–5.11)
RDW: 18.9 % — ABNORMAL HIGH (ref 11.5–15.5)
WBC: 4.8 10*3/uL (ref 4.0–10.5)
nRBC: 0.6 % — ABNORMAL HIGH (ref 0.0–0.2)

## 2023-04-11 LAB — KAPPA/LAMBDA LIGHT CHAINS
Kappa free light chain: 872 mg/L — ABNORMAL HIGH (ref 3.3–19.4)
Kappa, lambda light chain ratio: 212.68 — ABNORMAL HIGH (ref 0.26–1.65)
Lambda free light chains: 4.1 mg/L — ABNORMAL LOW (ref 5.7–26.3)

## 2023-04-11 LAB — PHOSPHORUS: Phosphorus: 1.5 mg/dL — ABNORMAL LOW (ref 2.5–4.6)

## 2023-04-11 LAB — MAGNESIUM: Magnesium: 2.1 mg/dL (ref 1.7–2.4)

## 2023-04-11 MED ORDER — POLYETHYLENE GLYCOL 3350 17 G PO PACK
17.0000 g | PACK | Freq: Every day | ORAL | Status: DC
  Administered 2023-04-11 – 2023-04-12 (×2): 17 g via ORAL
  Filled 2023-04-11 (×2): qty 1

## 2023-04-11 MED ORDER — PROLIA 60 MG/ML ~~LOC~~ SOSY: PREFILLED_SYRINGE | SUBCUTANEOUS | 1 refills | Status: DC

## 2023-04-11 MED ORDER — POTASSIUM PHOSPHATES 15 MMOLE/5ML IV SOLN
30.0000 mmol | Freq: Once | INTRAVENOUS | Status: DC
  Filled 2023-04-11: qty 10

## 2023-04-11 MED ORDER — POTASSIUM PHOSPHATES 15 MMOLE/5ML IV SOLN
30.0000 mmol | Freq: Once | INTRAVENOUS | Status: AC
  Administered 2023-04-11: 30 mmol via INTRAVENOUS
  Filled 2023-04-11: qty 10

## 2023-04-11 NOTE — Progress Notes (Signed)
PROGRESS NOTE    Kathy [name]  WGN:[id number] DOB: [birth year] DOA: 04/06/2023 PCP: [name], MD   Brief Narrative:    Kathy [name] is a 82 y.o. 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  Patient states she slid out of bed and fell on the floor, but was unable to get up.  Patient was admitted with hypovolemic shock/type of tension in the setting of severe dehydration and poor oral intake as well as associated prerenal AKI and hypercalcemia in the setting of multiple myeloma.  Assessment & Plan:   Principal Problem:   Hypercalcemia of malignancy Active Problems:   Essential hypertension   Acquired hypothyroidism   Physical deconditioning   General weakness   Recurrent falls   Hypotension   Hypoalbuminemia due to protein-calorie malnutrition (HCC)   Chronic kidney disease, stage 3b (HCC)   Dysphagia   Acute pancreatitis   History of thyroid cancer  Assessment and Plan:  Hypovolemic shock/hypotension-resolved -In the setting of severe dehydration and poor oral intake -Patient failed to improved with fluid resuscitation alone and require the use of vasopressors -Continue midodrine -Now weaned off of Levophed and may transfer to regular floor   Acute kidney injury on chronic kidney disease stage II -Associated with prerenal azotemia and underlying diagnosis of multiple myeloma -Continue minimizing nephrotoxic agents -Continue to maintain fluid resuscitation -Avoid the use of contrast and hypotension -Follow renal function trend.  Currently back to baseline. -Patient with chronic kidney disease stage II based on GFR.   Hypercalcemia-resolved -Recurrent and in the setting of multiple myeloma -Continue to maintain adequate hydration -Patient is status post Zometa -Continue  to follow calcium levels. -Oncology service on board and will follow further recommendations and care -Status post bone marrow biopsy 04/08/2023.  Hypophosphatemia/hypomagnesemia -Likely in the setting of refeeding syndrome -Replete as needed   Multiple myeloma -Oncology on board; will follow further recommendations. -Pending treatment initiation. -Status post bone marrow biopsy on 04/08/2023 by IR   Hypoalbuminemia secondary to severe protein calorie malnutrition -IV albumin x 1 given at time of admission. -Continue feeding supplements -Continue supportive care and encourage better oral intake/nutrition.   Dysphagia -Appreciate assistance and recommendation by speech therapy -Patient's diet has been adjusted to regular diet with thin liquids.   Gastroesophageal flux disease -Continue PPI. -As needed antiemetics available.   History of thyroid cancer -Continue outpatient follow-up with endocrinology service -Continue Synthroid. -Status post thyroidectomy.   Anemia of chronic disease -Hemoglobin 7.8 -No overt bleeding appreciated on exam. -Appears to be secondary to underlying multiple myeloma -Continue to follow hemoglobin trend and transfuse as needed.   DVT prophylaxis:Lovenox Code Status: Full Family Communication: Brother [name] Totherow on phone 9/8 Disposition Plan:  Status is: Inpatient Remains inpatient appropriate because: Need for IV medications.  Consultants:  Oncology  Procedures:  None  Antimicrobials:  None   Subjective: Patient seen and evaluated today with no new acute complaints or concerns. No acute concerns or events noted overnight.  She is awaiting SNF placement.  Objective: Vitals:   04/10/23 1600 04/10/23 2139 04/11/23 0003 04/11/23 0524  BP: 107/60 (!) 100/52 98/60 103/60  Pulse: 70 71 63 70  Resp: 18     Temp: 98 F (36.7 C) 98.5 F (36.9 C) 98.7 F (37.1 C) 98.2 F (36.8 C)  TempSrc: Oral Oral Oral Oral  SpO2:  100% 100% 96% 98%   Weight:      Height:        Intake/Output Summary (Last 24 hours) at 04/11/2023 1418 Last data filed at 04/11/2023 0900 Gross per 24 hour  Intake 835.15 ml  Output 400 ml  Net 435.15 ml   Filed Weights   04/06/23 1605 04/09/23 0627 04/10/23 0623  Weight: 68 kg 69.5 kg 70.3 kg    Examination:  General exam: Appears calm and comfortable  Respiratory system: Clear to auscultation. Respiratory effort normal. Cardiovascular system: S1 & S2 heard, RRR.  Gastrointestinal system: Abdomen is soft Central nervous system: Alert and awake Extremities: No edema Skin: No significant lesions noted Psychiatry: Flat affect.    Data Reviewed: I have personally reviewed following labs and imaging studies  CBC: Recent Labs  Lab 04/06/23 1705 04/07/23 0715 04/08/23 0444 04/10/23 0354 04/11/23 0425  WBC 6.5 6.1 4.8 5.0 4.8  NEUTROABS 4.3  --  2.9  --   --   HGB 9.2* 8.0* 8.2* 7.8* 8.0*  HCT 28.5* 25.0* 25.7* 24.0* 24.4*  MCV 102.5* 104.2* 104.0* 102.6* 103.4*  PLT 190 167 169 172 165   Basic Metabolic Panel: Recent Labs  Lab 04/07/23 0715 04/08/23 0444 04/10/23 0354 04/10/23 0420 04/10/23 1715 04/11/23 0425  NA 135 134* 133*  --  131* 134*  K 4.0 3.8 3.5  --  3.9 3.6  CL 106 104 105  --  104 104  CO2 23 23 23   --  21* 23  GLUCOSE 78 74 88  --  95 82  BUN 24* 16 9  --  10 9  CREATININE 1.11* 0.89 0.77  --  0.82 0.79  CALCIUM 11.3* 11.9* 10.2  --  9.9 9.7  MG 1.7 1.7  --  1.4* 2.4 2.1  PHOS 1.9* 1.4*  --  <1.0* 2.2* 1.5*   GFR: Estimated Creatinine Clearance: 50.3 mL/min (by C-G formula based on SCr of 0.79 mg/dL). Liver Function Tests: Recent Labs  Lab 04/06/23 2041 04/07/23 0715 04/08/23 0444 04/11/23 0425  AST 77* 78*  --  25  ALT 28 29  --  21  ALKPHOS 60 55  --  60  BILITOT 0.5 0.5  --  0.4  PROT 9.2* 8.4*  --  7.5  ALBUMIN <1.5* <1.5* <1.5* <1.5*   Recent Labs  Lab 04/06/23 2041  LIPASE 306*   No results for input(s): "AMMONIA" in the last 168  hours. Coagulation Profile: Recent Labs  Lab 04/06/23 2041  INR 1.6*   Cardiac Enzymes: No results for input(s): "CKTOTAL", "CKMB", "CKMBINDEX", "TROPONINI" in the last 168 hours. BNP (last 3 results) No results for input(s): "PROBNP" in the last 8760 hours. HbA1C: No results for input(s): "HGBA1C" in the last 72 hours. CBG: No results for input(s): "GLUCAP" in the last 168 hours. Lipid Profile: No results for input(s): "CHOL", "HDL", "LDLCALC", "TRIG", "CHOLHDL", "LDLDIRECT" in the last 72 hours. Thyroid Function Tests: No results for input(s): "TSH", "T4TOTAL", "FREET4", "T3FREE", "THYROIDAB" in the last 72 hours. Anemia Panel: No results for input(s): "VITAMINB12", "FOLATE", "FERRITIN", "TIBC", "IRON", "RETICCTPCT" in the last 72 hours. Sepsis Labs: Recent Labs  Lab 04/06/23 1750 04/06/23 2041  LATICACIDVEN 2.2* 2.0*    Recent Results (from the past 240 hour(s))  SARS Coronavirus 2 by RT PCR (hospital order, performed in [facility] hospital lab) *cepheid single result test* Anterior Nasal Swab     Status: None   Collection Time: 04/06/23  4:56 PM   Specimen: Anterior  Nasal Swab  Result Value Ref Range Status   SARS Coronavirus 2 by RT PCR NEGATIVE NEGATIVE Final    Comment: (NOTE) SARS-CoV-2 target nucleic acids are NOT DETECTED.  The SARS-CoV-2 RNA is generally detectable in upper and lower respiratory specimens during the acute phase of infection. The lowest concentration of SARS-CoV-2 viral copies this assay can detect is 250 copies / mL. A negative result does not preclude SARS-CoV-2 infection and should not be used as the sole basis for treatment or other patient management decisions.  A negative result may occur with improper specimen collection / handling, submission of specimen other than nasopharyngeal swab, presence of viral mutation(s) within the areas targeted by this assay, and inadequate number of viral copies (<250 copies / mL). A negative result must  be combined with clinical observations, patient history, and epidemiological information.  Fact Sheet for Patients:   RoadLapTop.co.za  Fact Sheet for Healthcare Providers: http://kim-miller.com/  This test is not yet approved or  cleared by the [place] FDA and has been authorized for detection and/or diagnosis of SARS-CoV-2 by FDA under an Emergency Use Authorization (EUA).  This EUA will remain in effect (meaning this test can be used) for the duration of the COVID-19 declaration under Section 564(b)(1) of the Act, 21 U.S.C. section 360bbb-3(b)(1), unless the authorization is terminated or revoked sooner.  Performed at [facility], [address], [place], [place] [address]   Culture, blood (single)     Status: None (Preliminary result)   Collection Time: 04/07/23  1:02 AM   Specimen: BLOOD RIGHT HAND  Result Value Ref Range Status   Specimen Description BLOOD RIGHT HAND  Final   Special Requests   Final    BOTTLES DRAWN AEROBIC AND ANAEROBIC Blood Culture adequate volume   Culture   Final    NO GROWTH 4 DAYS Performed at [facility], [address] [place], [place] [address]    Report Status PENDING  Incomplete  MRSA Next Gen by PCR, Nasal     Status: None   Collection Time: 04/07/23  7:18 AM   Specimen: Nasal Mucosa; Nasal Swab  Result Value Ref Range Status   MRSA by PCR Next Gen NOT DETECTED NOT DETECTED Final    Comment: (NOTE) The GeneXpert MRSA Assay (FDA approved for NASAL specimens only), is one component of a comprehensive MRSA colonization surveillance program. It is not intended to diagnose MRSA infection nor to guide or monitor treatment for MRSA infections. Test performance is not FDA approved in patients less than 42 years old. Performed at [facility], [address] [place], [place] [address]          Radiology Studies: No results found.      Scheduled Meds:  enoxaparin (LOVENOX) injection   40 mg Subcutaneous Q24H   levothyroxine  50 mcg Oral Daily   midodrine  7.5 mg Oral TID WC   pantoprazole (PROTONIX) IV  40 mg Intravenous Q24H   Continuous Infusions:  sodium chloride     norepinephrine (LEVOPHED) Adult infusion 1 mcg/min (04/10/23 0454)   potassium PHOSPHATE IVPB (in mmol) 30 mmol (04/11/23 1107)     LOS: 4 days    Time spent: 35 minutes    Codee Tutson Hoover Brunette, DO [facility]  If 7PM-7AM, please contact night-coverage [email or web address] 04/11/2023, 2:18 PM

## 2023-04-11 NOTE — Plan of Care (Signed)

## 2023-04-11 NOTE — TOC Progression Note (Signed)
Transition of Care [facility]) - Progression Note    Patient Details  Name: Kathy [name] MRN: [id number] Date of Birth: Nov 25, 1940  Transition of Care [facility]) CM/SW Contact  Elliot Gault, LCSW Phone Number: 04/11/2023, 2:15 PM  Clinical Narrative:     TOC following. Pt has just received insurance auth for SNF. Updated [facility] and they say that they can admit her tomorrow.  Will follow up in AM.  Expected Discharge Plan: Skilled Nursing Facility Barriers to Discharge: Continued Medical Work up, English as a second language teacher  Expected Discharge Plan and Services       Living arrangements for the past 2 months: Single Family Home                                       Social Determinants of Health (SDOH) Interventions SDOH Screenings   Food Insecurity: No Food Insecurity (04/07/2023)  Housing: Low Risk  (04/07/2023)  Transportation Needs: No Transportation Needs (04/07/2023)  Utilities: Not At Risk (04/07/2023)  Alcohol Screen: Low Risk  (09/14/2021)  Depression (PHQ2-9): Low Risk  (03/30/2023)  Financial Resource Strain: Low Risk  (09/14/2021)  Physical Activity: Sufficiently Active (09/14/2021)  Social Connections: Moderately Isolated (09/14/2021)  Stress: No Stress Concern Present (09/14/2021)  Tobacco Use: Low Risk  (04/06/2023)    Readmission Risk Interventions    03/16/2023   10:32 AM  Readmission Risk Prevention Plan  Transportation Screening Complete  PCP or Specialist Appt within 3-5 Days Complete  HRI or Home Care Consult Complete  Social Work Consult for Recovery Care Planning/Counseling Complete  Palliative Care Screening Not Applicable  Medication Review Oceanographer) Complete

## 2023-04-11 NOTE — Progress Notes (Signed)
Mobility Specialist Progress Note:    04/11/23 1330  Mobility  Activity Transferred from bed to chair;Stood at bedside (Transferred to WC)  Level of Assistance Maximum assist, patient does 25-49%  Assistive Device Wheelchair (HHA)  Distance Ambulated (ft) 4 ft  Range of Motion/Exercises Active;Right arm;Left arm;Right leg  Activity Response Tolerated well  Mobility Referral Yes  $Mobility charge 1 Mobility  Mobility Specialist Start Time (ACUTE ONLY) 1330  Mobility Specialist Stop Time (ACUTE ONLY) 1345  Mobility Specialist Time Calculation (min) (ACUTE ONLY) 15 min   Pt received in bed, agreeable to mobility. Required MaxA +2 to stand and transfer B>WC. Pt requested WC ride in hallway. Returned pt to room, transferred [facility] Tolerated well. Chair alarm on, visitor in room, all needs met.  Lawerance Bach Mobility Specialist Please contact via Special educational needs teacher or  Rehab office at [phone]

## 2023-04-12 DIAGNOSIS — E46 Unspecified protein-calorie malnutrition: Secondary | ICD-10-CM | POA: Diagnosis not present

## 2023-04-12 DIAGNOSIS — M6281 Muscle weakness (generalized): Secondary | ICD-10-CM | POA: Diagnosis not present

## 2023-04-12 DIAGNOSIS — G4700 Insomnia, unspecified: Secondary | ICD-10-CM | POA: Diagnosis not present

## 2023-04-12 DIAGNOSIS — I959 Hypotension, unspecified: Secondary | ICD-10-CM | POA: Diagnosis not present

## 2023-04-12 DIAGNOSIS — Z9181 History of falling: Secondary | ICD-10-CM | POA: Diagnosis not present

## 2023-04-12 DIAGNOSIS — C73 Malignant neoplasm of thyroid gland: Secondary | ICD-10-CM | POA: Diagnosis not present

## 2023-04-12 DIAGNOSIS — C9000 Multiple myeloma not having achieved remission: Secondary | ICD-10-CM | POA: Diagnosis not present

## 2023-04-12 DIAGNOSIS — K859 Acute pancreatitis without necrosis or infection, unspecified: Secondary | ICD-10-CM | POA: Diagnosis not present

## 2023-04-12 DIAGNOSIS — I4891 Unspecified atrial fibrillation: Secondary | ICD-10-CM | POA: Diagnosis not present

## 2023-04-12 DIAGNOSIS — M069 Rheumatoid arthritis, unspecified: Secondary | ICD-10-CM | POA: Diagnosis not present

## 2023-04-12 DIAGNOSIS — Z5112 Encounter for antineoplastic immunotherapy: Secondary | ICD-10-CM | POA: Diagnosis not present

## 2023-04-12 DIAGNOSIS — D649 Anemia, unspecified: Secondary | ICD-10-CM | POA: Diagnosis not present

## 2023-04-12 DIAGNOSIS — E860 Dehydration: Secondary | ICD-10-CM | POA: Diagnosis not present

## 2023-04-12 DIAGNOSIS — R6889 Other general symptoms and signs: Secondary | ICD-10-CM | POA: Diagnosis not present

## 2023-04-12 DIAGNOSIS — Z96652 Presence of left artificial knee joint: Secondary | ICD-10-CM | POA: Diagnosis not present

## 2023-04-12 DIAGNOSIS — E039 Hypothyroidism, unspecified: Secondary | ICD-10-CM | POA: Diagnosis not present

## 2023-04-12 DIAGNOSIS — D631 Anemia in chronic kidney disease: Secondary | ICD-10-CM | POA: Diagnosis not present

## 2023-04-12 DIAGNOSIS — I10 Essential (primary) hypertension: Secondary | ICD-10-CM | POA: Diagnosis not present

## 2023-04-12 DIAGNOSIS — R571 Hypovolemic shock: Secondary | ICD-10-CM | POA: Diagnosis not present

## 2023-04-12 DIAGNOSIS — Z96612 Presence of left artificial shoulder joint: Secondary | ICD-10-CM | POA: Diagnosis not present

## 2023-04-12 DIAGNOSIS — N3281 Overactive bladder: Secondary | ICD-10-CM | POA: Diagnosis not present

## 2023-04-12 DIAGNOSIS — Z96642 Presence of left artificial hip joint: Secondary | ICD-10-CM | POA: Diagnosis not present

## 2023-04-12 DIAGNOSIS — R1313 Dysphagia, pharyngeal phase: Secondary | ICD-10-CM | POA: Diagnosis not present

## 2023-04-12 DIAGNOSIS — Z7401 Bed confinement status: Secondary | ICD-10-CM | POA: Diagnosis not present

## 2023-04-12 DIAGNOSIS — I129 Hypertensive chronic kidney disease with stage 1 through stage 4 chronic kidney disease, or unspecified chronic kidney disease: Secondary | ICD-10-CM | POA: Diagnosis not present

## 2023-04-12 DIAGNOSIS — R531 Weakness: Secondary | ICD-10-CM | POA: Diagnosis not present

## 2023-04-12 DIAGNOSIS — M810 Age-related osteoporosis without current pathological fracture: Secondary | ICD-10-CM | POA: Diagnosis not present

## 2023-04-12 DIAGNOSIS — Z743 Need for continuous supervision: Secondary | ICD-10-CM | POA: Diagnosis not present

## 2023-04-12 DIAGNOSIS — M545 Low back pain, unspecified: Secondary | ICD-10-CM | POA: Diagnosis not present

## 2023-04-12 DIAGNOSIS — Z96611 Presence of right artificial shoulder joint: Secondary | ICD-10-CM | POA: Diagnosis not present

## 2023-04-12 DIAGNOSIS — K219 Gastro-esophageal reflux disease without esophagitis: Secondary | ICD-10-CM | POA: Diagnosis not present

## 2023-04-12 DIAGNOSIS — N1832 Chronic kidney disease, stage 3b: Secondary | ICD-10-CM | POA: Diagnosis not present

## 2023-04-12 DIAGNOSIS — E876 Hypokalemia: Secondary | ICD-10-CM | POA: Diagnosis not present

## 2023-04-12 LAB — COMPREHENSIVE METABOLIC PANEL
ALT: 20 U/L (ref 0–44)
AST: 25 U/L (ref 15–41)
Albumin: <1.5 g/dL — ABNORMAL LOW (ref 3.5–5.0)
Alkaline Phosphatase: 56 U/L (ref 38–126)
Anion gap: 7 (ref 5–15)
BUN: 9 mg/dL (ref 8–23)
CO2: 20 mmol/L — ABNORMAL LOW (ref 22–32)
Calcium: 9.4 mg/dL (ref 8.9–10.3)
Chloride: 108 mmol/L (ref 98–111)
Creatinine, Ser: 0.68 mg/dL (ref 0.44–1.00)
GFR, Estimated: >60 mL/min (ref >60)
Glucose, Bld: 71 mg/dL (ref 70–99)
Potassium: 3.8 mmol/L (ref 3.5–5.1)
Sodium: 135 mmol/L (ref 135–145)
Total Bilirubin: 0.3 mg/dL (ref 0.3–1.2)
Total Protein: 7.7 g/dL (ref 6.5–8.1)

## 2023-04-12 LAB — PROTEIN ELECTROPHORESIS, SERUM
A/G Ratio: 0.3 — ABNORMAL LOW (ref 0.7–1.7)
Albumin ELP: 2.2 g/dL — ABNORMAL LOW (ref 2.9–4.4)
Alpha-1-Globulin: 0.3 g/dL (ref 0.0–0.4)
Alpha-2-Globulin: 0.6 g/dL (ref 0.4–1.0)
Beta Globulin: 5.5 g/dL — ABNORMAL HIGH (ref 0.7–1.3)
Gamma Globulin: 0.2 g/dL — ABNORMAL LOW (ref 0.4–1.8)
Globulin, Total: 6.5 g/dL — ABNORMAL HIGH (ref 2.2–3.9)
M-Spike, %: 5.1 g/dL — ABNORMAL HIGH
Total Protein ELP: 8.7 g/dL — ABNORMAL HIGH (ref 6.0–8.5)

## 2023-04-12 LAB — GLUCOSE, CAPILLARY
Glucose-Capillary: 68 mg/dL — ABNORMAL LOW (ref 70–99)
Glucose-Capillary: 78 mg/dL (ref 70–99)
Glucose-Capillary: 115 mg/dL — ABNORMAL HIGH (ref 70–99)

## 2023-04-12 LAB — PHOSPHORUS: Phosphorus: 1.8 mg/dL — ABNORMAL LOW (ref 2.5–4.6)

## 2023-04-12 MED ORDER — POLYETHYLENE GLYCOL 3350 17 G PO PACK: 17.0000 g | PACK | Freq: Every day | ORAL | 0 refills | Status: DC

## 2023-04-12 MED ORDER — MIDODRINE HCL 2.5 MG PO TABS: 7.5000 mg | ORAL_TABLET | Freq: Three times a day (TID) | ORAL | 0 refills | Status: DC

## 2023-04-12 NOTE — Discharge Summary (Signed)
Physician Discharge Summary  Kathy [name] ZOX:[id number] DOB: 11-30-1940 DOA: 04/06/2023  PCP: [name], MD  Admit date: 04/06/2023  Discharge date: 04/12/2023  Admitted From:Home  Disposition:  SNF  Recommendations for Outpatient Follow-up:  Follow up with PCP in 1-2 weeks Follow-up with Dr. Ellin Saba as scheduled after PET scan (9/12).  Appointment is on 9/17 and follow-up bone biopsy results at that time which was performed on 9/6 Recheck calcium and phosphorus levels outpatient in 1-2 weeks Continue on medications as prescribed below along with midodrine to maintain blood pressure  Home Health: None  Equipment/Devices: None  Discharge Condition:Stable  CODE STATUS: Full  Diet recommendation: Heart Healthy  Brief/Interim Summary: Kathy [name] is a 82 y.o. 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  Patient states she slid out of bed and fell on the floor, but was unable to get up.  Patient was admitted with hypovolemic shock/type of tension in the setting of severe dehydration and poor oral intake as well as associated prerenal AKI and hypercalcemia in the setting of multiple myeloma.  Her hypotension resolved with adequate IV fluid administration and her hypercalcemia also resolved with the use of Zometa.  Oncology had seen patient and she underwent bone marrow biopsy 9/6 with IR and further results are pending.  She was noted to have some mild refeeding syndrome with hypomagnesemia and hypophosphatemia, but this also appears to have stabilized and resolved.  She is ready for discharge from a medical standpoint and has SNF bed available today.  No other acute events or concerns noted and she will follow-up as noted above.  Discharge Diagnoses:  Principal Problem:   Hypercalcemia of  malignancy Active Problems:   Essential hypertension   Acquired hypothyroidism   Physical deconditioning   General weakness   Recurrent falls   Hypotension   Hypoalbuminemia due to protein-calorie malnutrition (HCC)   Chronic kidney disease, stage 3b (HCC)   Dysphagia   Acute pancreatitis   History of thyroid cancer  Principal discharge diagnosis: Fall/weakness in the setting of hypovolemic shock with dehydration and poor oral intake associated with prerenal AKI and hypercalcemia in the setting of multiple myeloma.  Discharge Instructions  Discharge Instructions     Diet - low sodium heart healthy   Complete by: As directed    Increase activity slowly   Complete by: As directed       Allergies as of 04/12/2023       Reactions   Macrobid [nitrofurantoin] Other (See Comments)   Stomach pain        Medication List     STOP taking these medications    ALPRAZolam 0.25 MG tablet Commonly known as: XANAX   oxyCODONE-acetaminophen 5-325 MG tablet Commonly known as: PERCOCET/ROXICET   traMADol 50 MG tablet Commonly known as: ULTRAM       TAKE these medications    acetaminophen 500 MG tablet Commonly known as: TYLENOL Take 1 tablet (500 mg total) by mouth every 6 (six) hours as needed for mild pain, headache or fever (or Fever >/= 101).   diclofenac Sodium 1 % Gel Commonly known as: VOLTAREN APPLY (1) GRAM TO AFFECTED AREA DAILY. What changed:  how much to take how to take this when to take this additional instructions   ferrous sulfate 325 (65 FE) MG EC tablet Take 1 tablet (325  mg total) by mouth daily.   FISH OIL PO Take 1 capsule by mouth daily.   Fish Oil 1000 MG Caps Take by mouth.   furosemide 20 MG tablet Commonly known as: LASIX Take 1 tablet (20 mg total) by mouth 2 (two) times daily. What changed:  when to take this reasons to take this   Humira (2 Pen) 40 MG/0.4ML pen Generic drug: adalimumab Inject 40 mg into the skin every  Saturday.   levothyroxine 50 MCG tablet Commonly known as: Synthroid Take 1 tablet (50 mcg total) by mouth daily.   midodrine 2.5 MG tablet Commonly known as: PROAMATINE Take 3 tablets (7.5 mg total) by mouth 3 (three) times daily with meals.   multivitamin with minerals Tabs tablet Take 1 tablet by mouth every morning.   Narcan 4 MG/0.1ML Liqd nasal spray kit Generic drug: naloxone Place 1 spray into the nose as needed (opioid reversal).   omeprazole 40 MG capsule Commonly known as: PRILOSEC Take 1 capsule (40 mg total) by mouth daily before breakfast. Open capsule and swallow granules with liquid or applesauce   oxybutynin 10 MG 24 hr tablet Commonly known as: Ditropan XL Take 1 tablet (10 mg total) by mouth at bedtime.   oxybutynin 10 MG 24 hr tablet Commonly known as: DITROPAN-XL Take by mouth.   polyethylene glycol 17 g packet Commonly known as: MIRALAX / GLYCOLAX Take 17 g by mouth daily. Start taking on: April 13, 2023   potassium chloride SA 20 MEQ tablet Commonly known as: KLOR-CON M Take 1 tablet (20 mEq total) by mouth daily. TAKE 1 TABLET (20 MEQ) BY MOUTH DAILY PRN with Lasix (EDEMA)   Prolia 60 MG/ML Sosy injection Generic drug: denosumab inject 60mg  Subcutaneous every 6 months 180 days What changed: Another medication with the same name was added. Make sure you understand how and when to take each.   Prolia 60 MG/ML Sosy injection Generic drug: denosumab inject 60mg  Subcutaneous every 6 months 180 days What changed: Another medication with the same name was added. Make sure you understand how and when to take each.   Prolia 60 MG/ML Sosy injection Generic drug: denosumab 60mg  Subcutaneous every 6 months 180 days What changed: Another medication with the same name was added. Make sure you understand how and when to take each.   Prolia 60 MG/ML Sosy injection Generic drug: denosumab 60mg  Subcutaneous every 6 months 180 days What changed: You  were already taking a medication with the same name, and this prescription was added. Make sure you understand how and when to take each.   Prolia 60 MG/ML Sosy injection Generic drug: denosumab 60mg  Subcutaneous every 6 months 180 days What changed: You were already taking a medication with the same name, and this prescription was added. Make sure you understand how and when to take each.        Contact information for follow-up providers     [name], MD. Go to.   Specialty: Internal Medicine Contact information: [address] [place] [address] [phone]         Doreatha Massed, MD. Go to.   Specialty: Hematology Contact information: [address] [place] [address] [phone]              Contact information for after-discharge care     Destination     [facility] .   Service: Skilled Nursing Contact information: [phone]  [employer] [place] [address] [phone]                    Allergies  Allergen Reactions   Macrobid [Nitrofurantoin] Other (See Comments)    Stomach pain    Consultations: Oncology   Procedures/Studies: CT BONE MARROW BIOPSY & ASPIRATION  Result Date: 04/08/2023 CLINICAL DATA:  Myeloma EXAM: CT GUIDED DEEP ILIAC BONE MARROW ASPIRATION AND CORE BIOPSY TECHNIQUE: Patient was placed prone on the CT gantry and limited axial scans through the pelvis were obtained. This exam was performed according to the departmental dose-optimization program which includes automated exposure control, adjustment of the mA and/or kV according to patient size and/or use of iterative reconstruction technique. Appropriate skin entry site was identified. Skin site was marked, prepped with chlorhexidine, draped in usual sterile fashion, and infiltrated locally with 1% lidocaine. Intravenous Fentanyl and Versed 0.5mg  were  administered as conscious sedation during continuous monitoring of the patient's level of consciousness and physiological / cardiorespiratory status by the radiology RN, with a total moderate sedation time of 15 minutes. Under CT fluoroscopic guidance an 11-gauge Cook trocar bone needle was advanced into the RIGHT iliac bone just lateral to the sacroiliac joint. Once needle tip position was confirmed, core and aspiration samples were obtained, submitted to pathology for approval. Patient tolerated procedure well. COMPLICATIONS: COMPLICATIONS none IMPRESSION: 1. Technically successful CT guided right iliac bone core and aspiration biopsy. Electronically Signed   By: Corlis Leak M.D.   On: 04/08/2023 17:29   CT L-SPINE NO CHARGE  Result Date: 04/07/2023 CLINICAL DATA:  Pt bib [facility] from home, for recurrent falls and generalized weakness- pt was hospitalized and placed in rehab for this same issue and discharged back home 2 week EXAM: CT LUMBAR SPINE WITHOUT CONTRAST TECHNIQUE: Multidetector CT imaging of the lumbar spine was performed without intravenous contrast administration. Multiplanar CT image reconstructions were also generated. RADIATION DOSE REDUCTION: This exam was performed according to the departmental dose-optimization program which includes automated exposure control, adjustment of the mA and/or kV according to patient size and/or use of iterative reconstruction technique. COMPARISON:  CT abdomen pelvis 04/06/2023 FINDINGS: Segmentation: 5 lumbar type vertebrae. Alignment: Normal. Vertebrae: Diffusely decreased bone density. Multilevel moderate to severe degenerative changes of the spine. Posterior disc osteophyte complex formation at the L3-L4, L4-L5, L5-S1 levels. No acute fracture or focal pathologic process. Paraspinal and other soft tissues: Negative. Disc levels: Multilevel intervertebral disc space narrowing with fusion at the L2-L3 level and intervertebral disc space vacuum phenomenon at the  T12-L1, L4-L5, L5-S1 levels. Other: Please see separately dictated CT abdomen pelvis 04/06/2023 IMPRESSION: 1. No acute displaced fracture or traumatic listhesis of the lumbar spine. 2. Diffusely decreased bone density. 3. Multilevel severe degenerative changes of the lumbar spine. Electronically Signed   By: Tish Frederickson M.D.   On: 04/07/2023 00:26   CT ABDOMEN PELVIS W CONTRAST  Result Date: 04/06/2023 CLINICAL DATA:  Pancreatitis, acute, severe. Recurrent falls. Generalized weakness. EXAM: CT ABDOMEN AND PELVIS WITH CONTRAST TECHNIQUE: Multidetector CT imaging of the abdomen and pelvis was performed using the standard protocol following bolus administration of intravenous contrast. RADIATION DOSE REDUCTION: This exam was performed according to the departmental dose-optimization program which includes automated exposure control, adjustment of the mA and/or kV according to patient size and/or use of iterative reconstruction technique. CONTRAST:  OMNIPAQUE IOHEXOL 300 MG/ML  SOLN COMPARISON:  03/11/2023 FINDINGS: Lower chest: Bibasilar atelectasis or scarring, similar prior study. No effusions. Cardiomegaly.  Hepatobiliary: Low-density adjacent to the falciform ligament, likely focal fatty infiltration. Vascular shunting/malformation again seen in the posterior right hepatic lobe, stable. No focal hepatic abnormality. Prior cholecystectomy. Slight intrahepatic and extrahepatic biliary ductal dilatation likely related to age and post cholecystectomy state. Pancreas: Edema/stranding around the pancreas is similar prior study suggesting acute pancreatitis. Spleen: No focal abnormality.  Normal size. Adrenals/Urinary Tract: 5 mm left midpole renal stone. No hydronephrosis bilaterally. No suspicious renal or adrenal mass. Urinary bladder is obscured by beam hardening artifact from bilateral hip replacements. Stomach/Bowel: Diffuse colonic diverticulosis, most pronounced in the left colon. Postoperative changes  from gastric bypass. Small bowel decompressed. Vascular/Lymphatic: No evidence of aneurysm or adenopathy. Reproductive: Obscured by beam hardening artifact from bilateral hip replacements. Other: Trace free fluid in the cul-de-sac.  No free air. Musculoskeletal: No acute bony abnormality. IMPRESSION: Stranding/edema around the pancreas suggesting acute pancreatitis. This is similar prior study. Focal fatty infiltration of the liver adjacent to the falciform ligament. Prior gastric bypass.  No complicating feature. Left nephrolithiasis.  No hydronephrosis. Colonic diverticulosis. Electronically Signed   By: Charlett Nose M.D.   On: 04/06/2023 23:40   CT Head Wo Contrast  Result Date: 04/06/2023 CLINICAL DATA:  Mental status change, unknown cause EXAM: CT HEAD WITHOUT CONTRAST TECHNIQUE: Contiguous axial images were obtained from the base of the skull through the vertex without intravenous contrast. RADIATION DOSE REDUCTION: This exam was performed according to the departmental dose-optimization program which includes automated exposure control, adjustment of the mA and/or kV according to patient size and/or use of iterative reconstruction technique. COMPARISON:  MRI head 03/11/2023 FINDINGS: Brain: Patchy and confluent areas of decreased attenuation are noted throughout the deep and periventricular white matter of the cerebral hemispheres bilaterally, compatible with chronic microvascular ischemic disease. No evidence of large-territorial acute infarction. No parenchymal hemorrhage. No mass lesion. No extra-axial collection. No mass effect or midline shift. No hydrocephalus. Basilar cisterns are patent. Vascular: No hyperdense vessel. Atherosclerotic calcifications are present within the cavernous internal carotid arteries. Skull: No acute fracture or focal lesion. Sinuses/Orbits: Paranasal sinuses and mastoid air cells are clear. Bilateral lens replacement. Otherwise the orbits are unremarkable. Other: None.  IMPRESSION: No acute intracranial abnormality. Electronically Signed   By: Tish Frederickson M.D.   On: 04/06/2023 23:32   DG Chest Port 1 View  Result Date: 04/06/2023 CLINICAL DATA:  weakness. EXAM: PORTABLE CHEST 1 VIEW COMPARISON:  03/11/2023. FINDINGS: Increased interstitial markings are essentially similar to the prior study. No acute consolidation or major lung collapse. Bilateral costophrenic angles are clear. Stable cardio-mediastinal silhouette. Aortic arch calcifications noted. No acute osseous abnormalities. Note is made of bilateral shoulder arthroplasty hardware. The soft tissues are within normal limits. IMPRESSION: No acute cardiopulmonary process. Electronically Signed   By: Jules Schick M.D.   On: 04/06/2023 18:05     Discharge Exam: Vitals:   04/11/23 1933 04/12/23 0521  BP: 119/63 91/60  Pulse: 81 70  Resp: 20   Temp: 99.2 F (37.3 C) 97.6 F (36.4 C)  SpO2: 99% 98%   Vitals:   04/11/23 0524 04/11/23 1538 04/11/23 1933 04/12/23 0521  BP: 103/60 (!) 126/57 119/63 91/60  Pulse: 70 74 81 70  Resp:  18 20   Temp: 98.2 F (36.8 C) 98.4 F (36.9 C) 99.2 F (37.3 C) 97.6 F (36.4 C)  TempSrc: Oral Oral Oral Oral  SpO2: 98% 99% 99% 98%  Weight:      Height:        General: Pt  is alert, awake, not in acute distress Cardiovascular: RRR, S1/S2 +, no rubs, no gallops Respiratory: CTA bilaterally, no wheezing, no rhonchi Abdominal: Soft, NT, ND, bowel sounds + Extremities: no edema, no cyanosis    The results of significant diagnostics from this hospitalization (including imaging, microbiology, ancillary and laboratory) are listed below for reference.     Microbiology: Recent Results (from the past 240 hour(s))  SARS Coronavirus 2 by RT PCR (hospital order, performed in [facility] hospital lab) *cepheid single result test* Anterior Nasal Swab     Status: None   Collection Time: 04/06/23  4:56 PM   Specimen: Anterior Nasal Swab  Result Value Ref Range Status    SARS Coronavirus 2 by RT PCR NEGATIVE NEGATIVE Final    Comment: (NOTE) SARS-CoV-2 target nucleic acids are NOT DETECTED.  The SARS-CoV-2 RNA is generally detectable in upper and lower respiratory specimens during the acute phase of infection. The lowest concentration of SARS-CoV-2 viral copies this assay can detect is 250 copies / mL. A negative result does not preclude SARS-CoV-2 infection and should not be used as the sole basis for treatment or other patient management decisions.  A negative result may occur with improper specimen collection / handling, submission of specimen other than nasopharyngeal swab, presence of viral mutation(s) within the areas targeted by this assay, and inadequate number of viral copies (<250 copies / mL). A negative result must be combined with clinical observations, patient history, and epidemiological information.  Fact Sheet for Patients:   RoadLapTop.co.za  Fact Sheet for Healthcare Providers: http://kim-miller.com/  This test is not yet approved or  cleared by the [place] FDA and has been authorized for detection and/or diagnosis of SARS-CoV-2 by FDA under an Emergency Use Authorization (EUA).  This EUA will remain in effect (meaning this test can be used) for the duration of the COVID-19 declaration under Section 564(b)(1) of the Act, 21 U.S.C. section 360bbb-3(b)(1), unless the authorization is terminated or revoked sooner.  Performed at [facility], [address] [place], [place] [address]   Culture, blood (single)     Status: None (Preliminary result)   Collection Time: 04/07/23  1:02 AM   Specimen: BLOOD RIGHT HAND  Result Value Ref Range Status   Specimen Description BLOOD RIGHT HAND  Final   Special Requests   Final    BOTTLES DRAWN AEROBIC AND ANAEROBIC Blood Culture adequate volume   Culture   Final    NO GROWTH 4 DAYS Performed at [facility], [address], [place],  [place] [address]    Report Status PENDING  Incomplete  MRSA Next Gen by PCR, Nasal     Status: None   Collection Time: 04/07/23  7:18 AM   Specimen: Nasal Mucosa; Nasal Swab  Result Value Ref Range Status   MRSA by PCR Next Gen NOT DETECTED NOT DETECTED Final    Comment: (NOTE) The GeneXpert MRSA Assay (FDA approved for NASAL specimens only), is one component of a comprehensive MRSA colonization surveillance program. It is not intended to diagnose MRSA infection nor to guide or monitor treatment for MRSA infections. Test performance is not FDA approved in patients less than 53 years old. Performed at [facility], [address] [place], [place] [address]      Labs: BNP (last 3 results) Recent Labs    01/23/23 1234  BNP 848.0*   Basic Metabolic Panel: Recent Labs  Lab 04/07/23 0715 04/08/23 0444 04/10/23 0354 04/10/23 0420 04/10/23 1715 04/11/23 0425 04/12/23 0501  NA 135 134* 133*  --  131* 134* 135  K 4.0 3.8 3.5  --  3.9 3.6 3.8  CL 106 104 105  --  104 104 108  CO2 23 23 23   --  21* 23 20*  GLUCOSE 78 74 88  --  95 82 71  BUN 24* 16 9  --  10 9 9   CREATININE 1.11* 0.89 0.77  --  0.82 0.79 0.68  CALCIUM 11.3* 11.9* 10.2  --  9.9 9.7 9.4  MG 1.7 1.7  --  1.4* 2.4 2.1  --   PHOS 1.9* 1.4*  --  <1.0* 2.2* 1.5* 1.8*   Liver Function Tests: Recent Labs  Lab 04/06/23 2041 04/07/23 0715 04/08/23 0444 04/11/23 0425 04/12/23 0501  AST 77* 78*  --  25 25  ALT 28 29  --  21 20  ALKPHOS 60 55  --  60 56  BILITOT 0.5 0.5  --  0.4 0.3  PROT 9.2* 8.4*  --  7.5 7.7  ALBUMIN <1.5* <1.5* <1.5* <1.5* <1.5*   Recent Labs  Lab 04/06/23 2041  LIPASE 306*   No results for input(s): "AMMONIA" in the last 168 hours. CBC: Recent Labs  Lab 04/06/23 1705 04/07/23 0715 04/08/23 0444 04/10/23 0354 04/11/23 0425  WBC 6.5 6.1 4.8 5.0 4.8  NEUTROABS 4.3  --  2.9  --   --   HGB 9.2* 8.0* 8.2* 7.8* 8.0*  HCT 28.5* 25.0* 25.7* 24.0* 24.4*  MCV 102.5* 104.2* 104.0* 102.6* 103.4*   PLT 190 167 169 172 165   Cardiac Enzymes: No results for input(s): "CKTOTAL", "CKMB", "CKMBINDEX", "TROPONINI" in the last 168 hours. BNP: Invalid input(s): "POCBNP" CBG: Recent Labs  Lab 04/12/23 0723 04/12/23 0745  GLUCAP 68* 78   D-Dimer No results for input(s): "DDIMER" in the last 72 hours. Hgb A1c No results for input(s): "HGBA1C" in the last 72 hours. Lipid Profile No results for input(s): "CHOL", "HDL", "LDLCALC", "TRIG", "CHOLHDL", "LDLDIRECT" in the last 72 hours. Thyroid function studies No results for input(s): "TSH", "T4TOTAL", "T3FREE", "THYROIDAB" in the last 72 hours.  Invalid input(s): "FREET3" Anemia work up No results for input(s): "VITAMINB12", "FOLATE", "FERRITIN", "TIBC", "IRON", "RETICCTPCT" in the last 72 hours. Urinalysis    Component Value Date/Time   COLORURINE YELLOW 04/06/2023 1656   APPEARANCEUR CLEAR 04/06/2023 1656   LABSPEC 1.017 04/06/2023 1656   PHURINE 6.0 04/06/2023 1656   GLUCOSEU NEGATIVE 04/06/2023 1656   HGBUR NEGATIVE 04/06/2023 1656   BILIRUBINUR NEGATIVE 04/06/2023 1656   KETONESUR NEGATIVE 04/06/2023 1656   PROTEINUR 30 (A) 04/06/2023 1656   UROBILINOGEN 0.2 09/26/2013 1042   NITRITE NEGATIVE 04/06/2023 1656   LEUKOCYTESUR SMALL (A) 04/06/2023 1656   Sepsis Labs Recent Labs  Lab 04/07/23 0715 04/08/23 0444 04/10/23 0354 04/11/23 0425  WBC 6.1 4.8 5.0 4.8   Microbiology Recent Results (from the past 240 hour(s))  SARS Coronavirus 2 by RT PCR (hospital order, performed in [facility] hospital lab) *cepheid single result test* Anterior Nasal Swab     Status: None   Collection Time: 04/06/23  4:56 PM   Specimen: Anterior Nasal Swab  Result Value Ref Range Status   SARS Coronavirus 2 by RT PCR NEGATIVE NEGATIVE Final    Comment: (NOTE) SARS-CoV-2 target nucleic acids are NOT DETECTED.  The SARS-CoV-2 RNA is generally detectable in upper and lower respiratory specimens during the acute phase of infection. The  lowest concentration of SARS-CoV-2 viral copies this assay can detect is 250  copies / mL. A negative result does not preclude SARS-CoV-2 infection and should not be used as the sole basis for treatment or other patient management decisions.  A negative result may occur with improper specimen collection / handling, submission of specimen other than nasopharyngeal swab, presence of viral mutation(s) within the areas targeted by this assay, and inadequate number of viral copies (<250 copies / mL). A negative result must be combined with clinical observations, patient history, and epidemiological information.  Fact Sheet for Patients:   RoadLapTop.co.za  Fact Sheet for Healthcare Providers: http://kim-miller.com/  This test is not yet approved or  cleared by the [place] FDA and has been authorized for detection and/or diagnosis of SARS-CoV-2 by FDA under an Emergency Use Authorization (EUA).  This EUA will remain in effect (meaning this test can be used) for the duration of the COVID-19 declaration under Section 564(b)(1) of the Act, 21 U.S.C. section 360bbb-3(b)(1), unless the authorization is terminated or revoked sooner.  Performed at [facility], [address] [place], [place] [address]   Culture, blood (single)     Status: None (Preliminary result)   Collection Time: 04/07/23  1:02 AM   Specimen: BLOOD RIGHT HAND  Result Value Ref Range Status   Specimen Description BLOOD RIGHT HAND  Final   Special Requests   Final    BOTTLES DRAWN AEROBIC AND ANAEROBIC Blood Culture adequate volume   Culture   Final    NO GROWTH 4 DAYS Performed at [facility], [address], [place], [place] [address]    Report Status PENDING  Incomplete  MRSA Next Gen by PCR, Nasal     Status: None   Collection Time: 04/07/23  7:18 AM   Specimen: Nasal Mucosa; Nasal Swab  Result Value Ref Range Status   MRSA by PCR Next Gen NOT DETECTED NOT DETECTED  Final    Comment: (NOTE) The GeneXpert MRSA Assay (FDA approved for NASAL specimens only), is one component of a comprehensive MRSA colonization surveillance program. It is not intended to diagnose MRSA infection nor to guide or monitor treatment for MRSA infections. Test performance is not FDA approved in patients less than 16 years old. Performed at [facility], [address] [place], [place] [address]      Time coordinating discharge: 35 minutes  SIGNED:   Erick Blinks, DO [facility] 04/12/2023, 10:24 AM  If 7PM-7AM, please contact night-coverage [email or web address]

## 2023-04-12 NOTE — Consult Note (Signed)
   [facility] CM Inpatient Consult   04/12/2023  Kathy [name] 10-08-1940 [id number]  Primary Care Provider:  Dr. [facility])  Patient is currently active with Care Management for chronic disease management services.  Patient has been engaged by a Charity fundraiser.  Our community based plan of care has focused on disease management and community resource support.   Patient will receive a post hospital call and will be evaluated for assessments and disease process education.   Plan: Inpatient Transition Of Care [TOC] team member to make aware that Care Management following. Liaison will also alert the involved RNCM and PAC-RN on pt's discharge disposition for SNF placement.   Of note, Care Management services does not replace or interfere with any services that are needed or arranged by inpatient [facility] care management team.   For additional questions or referrals please contact:  Elliot Cousin, RN, [facility] Liaison [place]   Population Health Office Hours MTWF  8:00 am-6:00 pm [phone] mobile [phone] [Office toll free line] Office Hours are M-F 8:30 - 5 pm [email or web address]

## 2023-04-12 NOTE — Patient Instructions (Signed)
Visit Information  Thank you for taking time to visit with me today. Please don't hesitate to contact me if I can be of assistance to you.   Following are the goals we discussed today:   Goals Addressed             This Visit's Progress    Home safety management (ramp,bulking flooring)       Interventions Today    Flowsheet Row Most Recent Value  Chronic Disease   Chronic disease during today's visit Other, Diabetes  [Fall Memory changes, hypercalcemia,]  Mental Health Interventions   Mental Health Discussed/Reviewed Mental Health Reviewed, Other  [orientation check able to remember day of week, month, year but struggled with name of president No Neurology]  Pharmacy Interventions   Pharmacy Dicussed/Reviewed Pharmacy Topics Reviewed, Medications and their functions  [discussed medication contraindications]  Safety Interventions   Safety Discussed/Reviewed Safety Reviewed, Fall Risk, Home Safety  Home Safety Assistive Devices  [confirmed she has her neighbor who checks on her]              Our next appointment is by telephone on 04/15/23 at 1100  Please call the care guide team at [phone] if you need to cancel or reschedule your appointment.   If you are experiencing a Mental Health or Behavioral Health Crisis or need someone to talk to, please call the Suicide and Crisis Lifeline: 988 call the [place] National Suicide Prevention Lifeline: (778)824-4985 or TTY: 365-038-5769 TTY 684-808-4823) to talk to a trained counselor call 1-800-273-TALK (toll free, 24 hour hotline) call the [facility]: [phone] call 911   Patient verbalizes understanding of instructions and care plan provided today and agrees to view in MyChart. Active MyChart status and patient understanding of how to access instructions and care plan via MyChart confirmed with patient.     The patient has been provided with contact information for the care management team and has been  advised to call with any health related questions or concerns.   Jazlyn Tippens L. Noelle Penner, RN, BSN, CCM, Care Management Coordinator [phone]

## 2023-04-12 NOTE — TOC Transition Note (Signed)
Transition of Care [facility]) - CM/SW Discharge Note   Patient Details  Name: Kathy [name] MRN: [id number] Date of Birth: Apr 13, 1941  [facility]) CM/SW Contact:  Elliot Gault, LCSW Phone Number: 04/12/2023, 10:53 AM   Clinical Narrative:     Pt remains medically stable for dc per MD. Pt has insurance auth and [facility] can admit her today. Pt remains in agreement with dc plan.  DC clinical sent electronically. RN to call report. EMS arranged.  No other TOC needs for dc.  Final next level of care: Skilled Nursing Facility Barriers to Discharge: Barriers Resolved   Patient Goals and CMS Choice      Discharge Placement                Patient chooses bed at: [facility] Patient to be transferred to facility by: EMS Name of family member notified: Pt only Patient and family notified of of transfer: 04/12/23  Discharge Plan and Services Additional resources added to the After Visit Summary for                                       Social Determinants of Health (SDOH) Interventions SDOH Screenings   Food Insecurity: No Food Insecurity (04/07/2023)  Housing: Low Risk  (04/07/2023)  Transportation Needs: No Transportation Needs (04/07/2023)  Utilities: Not At Risk (04/07/2023)  Alcohol Screen: Low Risk  (09/14/2021)  Depression (PHQ2-9): Low Risk  (03/30/2023)  Financial Resource Strain: Low Risk  (09/14/2021)  Physical Activity: Sufficiently Active (09/14/2021)  Social Connections: Moderately Isolated (09/14/2021)  Stress: No Stress Concern Present (09/14/2021)  Tobacco Use: Low Risk  (04/06/2023)     Readmission Risk Interventions    03/16/2023   10:32 AM  Readmission Risk Prevention Plan  Transportation Screening Complete  PCP or Specialist Appt within 3-5 Days Complete  HRI or Home Care Consult Complete  Social Work Consult for Recovery Care Planning/Counseling Complete  Palliative Care Screening Not Applicable  Medication  Review Oceanographer) Complete

## 2023-04-12 NOTE — Care Management Important Message (Signed)
Important Message  Patient Details  Name: Kathy [name] MRN: [id number] Date of Birth: August 25, 1940   Medicare Important Message Given:  Yes     Corey Harold 04/12/2023, 10:50 AM

## 2023-04-12 NOTE — Patient Instructions (Signed)
Visit Information  Thank you for taking time to visit with me today. Please don't hesitate to contact me if I can be of assistance to you.   Following are the goals we discussed today:   Goals Addressed             This Visit's Progress    Home safety management (ramp,bulking flooring)   Not on track    Interventions Today    Flowsheet Row Most Recent Value  Chronic Disease   Chronic disease during today's visit Other  [home safety/repairs]  General Interventions   General Interventions Discussed/Reviewed General Interventions Discussed, Labs, Durable Medical Equipment (DME), [employer], Doctor Visits  Labs --  Collene Mares, vit D -Lab info sent to [facility]]  Doctor Visits Discussed/Reviewed Doctor Visits Discussed, PCP, Specialist  Durable Medical Equipment (DME) BP Cuff  PCP/Specialist Visits Compliance with follow-up visit  Exercise Interventions   Exercise Discussed/Reviewed Exercise Discussed, Physical Activity, Assistive device use and maintanence  Physical Activity Discussed/Reviewed Physical Activity Discussed  Education Interventions   Education Provided Provided Education  Provided Verbal Education On Nutrition, Labs, Mental Health/Coping with Illness, Medication, Community Resources  Labs Reviewed --  [calcium]  Mental Health Interventions   Mental Health Discussed/Reviewed Mental Health Discussed, Coping Strategies  Nutrition Interventions   Nutrition Discussed/Reviewed Nutrition Discussed, Fluid intake, Decreasing salt  Pharmacy Interventions   Pharmacy Dicussed/Reviewed Pharmacy Topics Discussed, Medications and their functions, Affording Medications  Safety Interventions   Safety Discussed/Reviewed Safety Discussed, Fall Risk  [bulking flooring, no home ramp to assist with getting down 4 steps]              Our next appointment is by telephone on 04/01/23 at 2 pm  Please call the care guide team at [phone] if you need to cancel or reschedule your  appointment.   If you are experiencing a Mental Health or Behavioral Health Crisis or need someone to talk to, please call the Suicide and Crisis Lifeline: 988 call the [place] National Suicide Prevention Lifeline: 3642366402 or TTY: 2693250968 TTY 314-337-1867) to talk to a trained counselor call 1-800-273-TALK (toll free, 24 hour hotline) call the [facility]: [phone] call 911   Patient verbalizes understanding of instructions and care plan provided today and agrees to view in MyChart. Active MyChart status and patient understanding of how to access instructions and care plan via MyChart confirmed with patient.     The patient has been provided with contact information for the care management team and has been advised to call with any health related questions or concerns.   Narmeen Kerper L. Noelle Penner, RN, BSN, CCM, Care Management Coordinator [phone]

## 2023-04-13 ENCOUNTER — Encounter: Payer: Self-pay | Admitting: *Deleted

## 2023-04-13 DIAGNOSIS — D649 Anemia, unspecified: Secondary | ICD-10-CM | POA: Diagnosis not present

## 2023-04-13 DIAGNOSIS — I10 Essential (primary) hypertension: Secondary | ICD-10-CM | POA: Diagnosis not present

## 2023-04-13 LAB — CULTURE, BLOOD (SINGLE)
Culture: NO GROWTH
Special Requests: ADEQUATE

## 2023-04-13 LAB — SURGICAL PATHOLOGY

## 2023-04-13 NOTE — Progress Notes (Addendum)
Amy @ [facility] was contacted by telephone to verify understanding of discharge instructions status post their most recent discharge from the hospital on the date:  04/08/23.  Inpatient discharge AVS was re-reviewed with patient, along with cancer center appointments.  Verification of understanding for oncology specific follow-up was validated using the Teach Back method.  PET scan tomorrow 9/12 and follow up in clinic 9/17.  Transportation to appointments were confirmed for the patient as being  Transportation provided by center .  Amy's questions were addressed to their satisfaction upon completion of this post discharge follow-up call for outpatient oncology.

## 2023-04-14 ENCOUNTER — Ambulatory Visit (HOSPITAL_COMMUNITY)
Admission: RE | Admit: 2023-04-14 | Discharge: 2023-04-14 | Disposition: A | Discharge status: Home / Self Care | Payer: 59 | Source: Ambulatory Visit | Attending: Hematology | Admitting: Hematology

## 2023-04-14 ENCOUNTER — Other Ambulatory Visit: Payer: Self-pay

## 2023-04-14 ENCOUNTER — Encounter (HOSPITAL_COMMUNITY): Payer: Self-pay | Admitting: Hematology

## 2023-04-14 DIAGNOSIS — C9000 Multiple myeloma not having achieved remission: Secondary | ICD-10-CM

## 2023-04-15 ENCOUNTER — Inpatient Hospital Stay: Payer: 59 | Admitting: Oncology

## 2023-04-15 ENCOUNTER — Ambulatory Visit: Payer: Self-pay | Admitting: *Deleted

## 2023-04-15 ENCOUNTER — Other Ambulatory Visit (HOSPITAL_COMMUNITY): Payer: Self-pay

## 2023-04-15 DIAGNOSIS — M810 Age-related osteoporosis without current pathological fracture: Secondary | ICD-10-CM | POA: Diagnosis not present

## 2023-04-15 DIAGNOSIS — G4700 Insomnia, unspecified: Secondary | ICD-10-CM | POA: Diagnosis not present

## 2023-04-15 DIAGNOSIS — D631 Anemia in chronic kidney disease: Secondary | ICD-10-CM | POA: Diagnosis not present

## 2023-04-15 DIAGNOSIS — E039 Hypothyroidism, unspecified: Secondary | ICD-10-CM | POA: Diagnosis not present

## 2023-04-15 DIAGNOSIS — K219 Gastro-esophageal reflux disease without esophagitis: Secondary | ICD-10-CM | POA: Diagnosis not present

## 2023-04-15 DIAGNOSIS — I959 Hypotension, unspecified: Secondary | ICD-10-CM | POA: Diagnosis not present

## 2023-04-15 DIAGNOSIS — E860 Dehydration: Secondary | ICD-10-CM | POA: Diagnosis not present

## 2023-04-15 DIAGNOSIS — E876 Hypokalemia: Secondary | ICD-10-CM | POA: Diagnosis not present

## 2023-04-15 DIAGNOSIS — M069 Rheumatoid arthritis, unspecified: Secondary | ICD-10-CM | POA: Diagnosis not present

## 2023-04-15 DIAGNOSIS — M6281 Muscle weakness (generalized): Secondary | ICD-10-CM | POA: Diagnosis not present

## 2023-04-15 DIAGNOSIS — R571 Hypovolemic shock: Secondary | ICD-10-CM | POA: Diagnosis not present

## 2023-04-15 LAB — PTH-RELATED PEPTIDE: PTH-related peptide: <2 pmol/L

## 2023-04-15 NOTE — Patient Outreach (Signed)
Care Coordination   Follow Up Visit Note   04/15/2023 Name: Kathy [name] MRN: [id number] DOB: 04/04/1941  Kathy [name] is a 82 y.o. year old female who sees [name], MD for primary care. I spoke with  Kathy [name] by phone today.  What matters to the patients health and wellness today?  Patient updated RN CM that she was discharged to [facility] on 04/12/23 for Rehab.  She does request interest in getting a bed for her home discharge. Voiced understanding that the facility staff may attempt to assist with her hospital bed for home. She voiced appreciation for RN CM checking and following up about the hospital bed for home discharge    Goals Addressed             This Visit's Progress    Home safety management (ramp,bulking flooring)   On track    Interventions Today    Flowsheet Row Most Recent Value  Chronic Disease   Chronic disease during today's visit Other  [rehab facility stay, hospital bed on discharge]  General Interventions   General Interventions Discussed/Reviewed General Interventions Reviewed, Durable Medical Equipment (DME), Community Resources, Level of Care, Communication with  Doctor Visits Discussed/Reviewed Doctor Visits Reviewed, PCP  Durable Medical Equipment (DME) Other  [requesting hospital bed for home discharge]  PCP/Specialist Visits Compliance with follow-up visit  Communication with RN, PCP/Specialists  [unsuccessful outreach to [facility] [phone] to Care coordination Post acute staff & confirmed patients facility is not a facility for this care coordination program. will attempt to try the facility again or have pcp to assist@discharge ]  Exercise Interventions   Physical Activity Discussed/Reviewed Physical Activity Reviewed  Kathy [name]]  Education Interventions   Education Provided --  [facility SW/CM possibly available to assist with hospital bed or will have pcp assist at discharge]  Provided Verbal  Education On [employer]              SDOH assessments and interventions completed:  No     Care Coordination Interventions:  Yes, provided   Follow up plan: Follow up call scheduled for 05/06/23    Encounter Outcome:  Patient Visit Completed   Cala Bradford L. Noelle Penner, RN, BSN, CCM, Care Management Coordinator [phone]

## 2023-04-15 NOTE — Patient Outreach (Signed)
Care Coordination   Collaboration with skilled nursing SW  Visit Note   04/15/2023 Name: Zenola Vroman Luthi MRN: [id number] DOB: 1940-12-06  Kierney Orecchio Derden is a 82 y.o. year old female who sees [name], MD for primary care. I  spoke with Carlie at [employer]   What matters to the patients health and wellness today?  Request for hospital bed for home discharge Carlie at [facility] to assist     Goals Addressed             This Visit's Progress    Home safety management (ramp,bulking flooring)   On track    Interventions Today    Flowsheet Row Most Recent Value  Chronic Disease   Chronic disease during today's visit Other  [rehab facility stay, hospital bed on discharge]  General Interventions   General Interventions Discussed/Reviewed General Interventions Reviewed, Durable Medical Equipment (DME), Community Resources, Level of Care, Communication with  Doctor Visits Discussed/Reviewed Doctor Visits Reviewed, PCP  Durable Medical Equipment (DME) Other  [requesting hospital bed for home discharge]  PCP/Specialist Visits Compliance with follow-up visit  Communication with --  Delene Ruffini at [facility] to discuss patient request for assist with a hospital bed at discharge [phone]]  Exercise Interventions   Physical Activity Discussed/Reviewed Physical Activity Reviewed  Fuller Mandril PT]  Education Interventions   Education Provided --  [facility SW/CM possibly available to assist with hospital bed or will have pcp assist at discharge]  Provided Verbal Education On [employer]              SDOH assessments and interventions completed:  No     Care Coordination Interventions:  Yes, provided   Follow up plan: Follow up call scheduled for 05/06/23    Encounter Outcome:  Patient Visit Completed   Cala Bradford L. Noelle Penner, RN, BSN, CCM, Care Management Coordinator [phone]

## 2023-04-15 NOTE — Patient Instructions (Signed)
Visit Information  Thank you for taking time to visit with me today. Please don't hesitate to contact me if I can be of assistance to you.   Following are the goals we discussed today:   Goals Addressed             This Visit's Progress    Home safety management (ramp,bulking flooring)   On track    Interventions Today    Flowsheet Row Most Recent Value  Chronic Disease   Chronic disease during today's visit Other  [rehab facility stay, hospital bed on discharge]  General Interventions   General Interventions Discussed/Reviewed General Interventions Reviewed, Durable Medical Equipment (DME), Community Resources, Level of Care, Communication with  Doctor Visits Discussed/Reviewed Doctor Visits Reviewed, PCP  Durable Medical Equipment (DME) Other  [requesting hospital bed for home discharge]  PCP/Specialist Visits Compliance with follow-up visit  Communication with --  Delene Ruffini at [facility] to discuss patient request for assist with a hospital bed at discharge [phone]]  Exercise Interventions   Physical Activity Discussed/Reviewed Physical Activity Reviewed  Fuller Mandril PT]  Education Interventions   Education Provided --  [facility SW/CM possibly available to assist with hospital bed or will have pcp assist at discharge]  Provided Verbal Education On [employer]              Our next appointment is by telephone on 05/06/23 at 1100  Please call the care guide team at [phone] if you need to cancel or reschedule your appointment.   If you are experiencing a Mental Health or Behavioral Health Crisis or need someone to talk to, please call the Suicide and Crisis Lifeline: 988 call the [place] National Suicide Prevention Lifeline: 609-234-1586 or TTY: (762)193-1894 TTY 4148746533) to talk to a trained counselor call 1-800-273-TALK (toll free, 24 hour hotline) call the [facility]: [phone] call 911   Patient verbalizes  understanding of instructions and care plan provided today and agrees to view in MyChart. Active MyChart status and patient understanding of how to access instructions and care plan via MyChart confirmed with patient.     The patient has been provided with contact information for the care management team and has been advised to call with any health related questions or concerns.   Shawnia Vizcarrondo L. Noelle Penner, RN, BSN, CCM, Care Management Coordinator [phone]

## 2023-04-15 NOTE — Patient Outreach (Signed)
Care Coordination   04/15/2023 Name: Larysa Milani Pickard MRN: [id number] DOB: 01/27/1941   Care Coordination Outreach Attempts:  An unsuccessful telephone outreach was attempted for a scheduled appointment today.  Follow Up Plan:  Additional outreach attempts will be made to offer the patient care coordination information and services.   Encounter Outcome:  No Answer   Care Coordination Interventions:  No, not indicated    Julien Oscar L. Noelle Penner, RN, BSN, CCM, Care Management Coordinator [phone]

## 2023-04-17 LAB — IMMUNOFIXATION ELECTROPHORESIS
IgA: 16 mg/dL — ABNORMAL LOW (ref 64–422)
IgG (Immunoglobin G), Serum: 5939 mg/dL — ABNORMAL HIGH (ref 586–1602)
IgM (Immunoglobulin M), Srm: 7 mg/dL — ABNORMAL LOW (ref 26–217)
Total Protein ELP: 9.3 g/dL — ABNORMAL HIGH (ref 6.0–8.5)

## 2023-04-18 ENCOUNTER — Other Ambulatory Visit (HOSPITAL_COMMUNITY): Payer: Self-pay

## 2023-04-18 DIAGNOSIS — Z9181 History of falling: Secondary | ICD-10-CM | POA: Diagnosis not present

## 2023-04-18 DIAGNOSIS — E039 Hypothyroidism, unspecified: Secondary | ICD-10-CM | POA: Diagnosis not present

## 2023-04-18 DIAGNOSIS — R571 Hypovolemic shock: Secondary | ICD-10-CM | POA: Diagnosis not present

## 2023-04-18 DIAGNOSIS — M810 Age-related osteoporosis without current pathological fracture: Secondary | ICD-10-CM | POA: Diagnosis not present

## 2023-04-18 DIAGNOSIS — E860 Dehydration: Secondary | ICD-10-CM | POA: Diagnosis not present

## 2023-04-18 DIAGNOSIS — D631 Anemia in chronic kidney disease: Secondary | ICD-10-CM | POA: Diagnosis not present

## 2023-04-18 DIAGNOSIS — M6281 Muscle weakness (generalized): Secondary | ICD-10-CM | POA: Diagnosis not present

## 2023-04-18 DIAGNOSIS — M069 Rheumatoid arthritis, unspecified: Secondary | ICD-10-CM | POA: Diagnosis not present

## 2023-04-18 DIAGNOSIS — I959 Hypotension, unspecified: Secondary | ICD-10-CM | POA: Diagnosis not present

## 2023-04-18 DIAGNOSIS — E876 Hypokalemia: Secondary | ICD-10-CM | POA: Diagnosis not present

## 2023-04-18 DIAGNOSIS — G4700 Insomnia, unspecified: Secondary | ICD-10-CM | POA: Diagnosis not present

## 2023-04-18 NOTE — Progress Notes (Signed)
[facility] [address] [place] [address]   Clinic Day:  04/18/2023  Referring physician: [name], MD  Patient Care Team: [name], MD as PCP - General (Internal Medicine) Wyline Mood Dorothe Pea, MD as PCP - Cardiology (Cardiology) Clinton Gallant, RN as [facility] Care Management Dorisann Frames, MD as Referring Physician (Endocrinology) Doreatha Massed, MD as Medical Oncologist (Medical Oncology) Therese Sarah, RN as Oncology Nurse Navigator (Medical Oncology)   ASSESSMENT & PLAN:   Assessment: ***  Plan: ***  No orders of the defined types were placed in this encounter.     Alben Deeds Teague,acting as a Neurosurgeon for Doreatha Massed, MD.,have documented all relevant documentation on the behalf of Doreatha Massed, MD,as directed by  Doreatha Massed, MD while in the presence of Doreatha Massed, MD.   ***  Sawyer R Teague   9/16/20249:41 PM  CHIEF COMPLAINT/PURPOSE OF CONSULT:   Diagnosis: Multiple myeloma  Cancer Staging  Multiple myeloma (HCC) Staging form: Plasma Cell Myeloma and Plasma Cell Disorders, AJCC 8th Edition - Clinical stage from 04/14/2023: Beta-2-microglobulin (mg/L): 3.6, Albumin (g/dL): 1.5, ISS: Stage II, LDH: Elevated - Unsigned    Prior Therapy: ***  Current Therapy:  ***   HISTORY OF PRESENT ILLNESS:   Oncology History   No history exists.      Kathy [name] is a 82 y.o. female presenting to clinic today for evaluation of Multiple myeloma at the request of [name], MD.  Patient was recently in the ED on 04/06/23 for multiple falls and hypercalcemia where she was seen by me. Labs were consistent with MM and bone marrow biopsy and PET were ordered. BMBX on 04/08/23 revealed: lambda restricted plasma cell neoplasm involving approximately 50% of the cellular marrow consistent with plasma cell myeloma the appropriate clinical/serologic setting. The peripheral blood found  macrocytic anemia.   Today, she states that she is doing well overall. Her appetite level is at ***%. Her energy level is at ***%.  PAST MEDICAL HISTORY:   Past Medical History: Past Medical History:  Diagnosis Date   Abnormal findings on diagnostic imaging of heart and coronary circulation    Anemia    FROM BLEEDING ULCER   Anxiety    takes Alprazolam daily as needed   Arthritis    dx with RA 2017   Bariatric surgery status    Cataract    Diverticulosis    Gastro-esophageal reflux disease with esophagitis    Gastrojejunal ulcer with hemorrhage    Headache(784.0)    occasionally   History of blood transfusion    no abnormal reaction noted   History of bronchitis    last time many yrs ago   Hyperlipidemia    PT DENIES THIS DX -  ON NO MEDS AND NO ONE HAS TOLD HER   Hypertension    takes Amlodipine daily   Hypothyroidism    takes Synthroid daily   Insomnia    Joint pain    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   Localized edema    Nocturia    Numbness    occasionally left arm at night   Obesity    Osteoporosis    takes Fosamax weekly   Pain in joint involving pelvic region and thigh    Peripheral edema    takes Lasix daily as needed   PONV (postoperative nausea and vomiting)    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/2017   Rheumatoid arthritis, unspecified (  HCC)    Rotator cuff arthropathy, 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   Sleep apnea    Phreesia 08/22/2020   Stomach ulcer    Thyroid disease    Phreesia 08/22/2020   Unspecified injury of muscle(s) and tendon(s) of the rotator cuff of left shoulder, subsequent encounter    Unspecified osteoarthritis, unspecified site    Wears glasses    Wears partial dentures     Surgical History: Past Surgical History:  Procedure Laterality Date   ABDOMINAL HYSTERECTOMY     partial   cataract surgery Bilateral    CHOLECYSTECTOMY     COLONOSCOPY     ESOPHAGOGASTRODUODENOSCOPY (EGD) WITH PROPOFOL N/A 01/11/2022    Procedure: ESOPHAGOGASTRODUODENOSCOPY (EGD) WITH PROPOFOL;  Surgeon: Iva Boop, MD;  Location: [facility] ENDOSCOPY;  Service: Gastroenterology;  Laterality: N/A;   EYE SURGERY     CATARACTS BOTH   gastric bypass surgery     HOT HEMOSTASIS N/A 01/11/2022   Procedure: HOT HEMOSTASIS (ARGON PLASMA COAGULATION/BICAP);  Surgeon: Iva Boop, MD;  Location: [facility] ENDOSCOPY;  Service: Gastroenterology;  Laterality: N/A;   JOINT REPLACEMENT Bilateral    hip   REVERSE SHOULDER ARTHROPLASTY Right 05/11/2016   Procedure: REVERSE SHOULDER ARTHROPLASTY;  Surgeon: Teryl Lucy, MD;  Location: [facility] OR;  Service: Orthopedics;  Laterality: Right;   REVISION TOTAL HIP ARTHROPLASTY Left 10/03/2013   DR Turner Daniels   ROTATOR CUFF REPAIR     SCLEROTHERAPY  01/11/2022   Procedure: Susa Day;  Surgeon: Iva Boop, MD;  Location: [facility] ENDOSCOPY;  Service: Gastroenterology;;   TOTAL HIP REVISION Left 10/03/2013   Procedure: TOTAL HIP REVISION- left;  Surgeon: Nestor Lewandowsky, MD;  Location: [facility] OR;  Service: Orthopedics;  Laterality: Left;   TOTAL KNEE ARTHROPLASTY Left 11/29/2017   Procedure: LEFT TOTAL KNEE ARTHROPLASTY;  Surgeon: Teryl Lucy, MD;  Location: [facility] OR;  Service: Orthopedics;  Laterality: Left;   TOTAL SHOULDER ARTHROPLASTY Left 11/09/2016   Procedure: TOTAL REVERSE SHOULDER ARTHROPLASTY;  Surgeon: Teryl Lucy, MD;  Location: [facility] OR;  Service: Orthopedics;  Laterality: Left;   TOTAL SHOULDER REPLACEMENT Left 10/2016   TOTAL THYROIDECTOMY     UPPER GASTROINTESTINAL ENDOSCOPY      Social History: Social History   Socioeconomic History   Marital status: Divorced    Spouse name: Not on file   Number of children: Not on file   Years of education: Not on file   Highest education level: Not on file  Occupational History   Occupation: disabled  Tobacco Use   Smoking status: Never    Passive exposure: Never   Smokeless tobacco: Never  Vaping Use   Vaping status: Never Used  Substance and  Sexual Activity   Alcohol use: No   Drug use: No   Sexual activity: Not Currently    Birth control/protection: Surgical  Other Topics Concern   Not on file  Social History Narrative   Not on file   Social Determinants of Health   Financial Resource Strain: Low Risk  (09/14/2021)   Overall Financial Resource Strain (CARDIA)    Difficulty of Paying Living Expenses: Not hard at all  Food Insecurity: No Food Insecurity (04/07/2023)   Hunger Vital Sign    Worried About Running Out of Food in the Last Year: Never true    Ran Out of Food in the Last Year: Never true  Transportation Needs: No Transportation Needs (04/07/2023)   PRAPARE - Administrator, Civil Service (Medical): No    Lack of Transportation (Non-Medical):  No  Physical Activity: Sufficiently Active (09/14/2021)   Exercise Vital Sign    Days of Exercise per Week: 6 days    Minutes of Exercise per Session: 40 min  Stress: No Stress Concern Present (09/14/2021)   [employer] of Occupational Health - Occupational Stress Questionnaire    Feeling of Stress : Not at all  Social Connections: Moderately Isolated (09/14/2021)   Social Connection and Isolation Panel [NHANES]    Frequency of Communication with Friends and Family: More than three times a week    Frequency of Social Gatherings with Friends and Family: More than three times a week    Attends Religious Services: More than 4 times per year    Active Member of [employer] or Organizations: No    Attends Banker Meetings: Never    Marital Status: Divorced  Catering manager Violence: Not At Risk (04/07/2023)   Humiliation, Afraid, Rape, and Kick questionnaire    Fear of Current or Ex-Partner: No    Emotionally Abused: No    Physically Abused: No    Sexually Abused: No    Family History: Family History  Problem Relation Age of Onset   Diabetes Mother    Congestive Heart Failure Mother    Lung cancer Father    Thrombosis Sister    CAD Brother         CABG   Colon cancer Neg Hx    Esophageal cancer Neg Hx    Stomach cancer Neg Hx    Rectal cancer Neg Hx     Current Medications:  Current Outpatient Medications:    acetaminophen (TYLENOL) 500 MG tablet, Take 1 tablet (500 mg total) by mouth every 6 (six) hours as needed for mild pain, headache or fever (or Fever >/= 101)., Disp: 30 tablet, Rfl: 0   Adalimumab (HUMIRA PEN) 40 MG/0.4ML PNKT, Inject 40 mg into the skin every Saturday., Disp: , Rfl:    denosumab (PROLIA) 60 MG/ML SOSY injection, inject 60mg  Subcutaneous every 6 months 180 days (Patient not taking: Reported on 04/07/2023), Disp: 1 mL, Rfl: 1   denosumab (PROLIA) 60 MG/ML SOSY injection, inject 60mg  Subcutaneous every 6 months 180 days (Patient not taking: Reported on 04/07/2023), Disp: 1 mL, Rfl: 1   denosumab (PROLIA) 60 MG/ML SOSY injection, 60mg  Subcutaneous every 6 months 180 days (Patient not taking: Reported on 04/07/2023), Disp: 1 mL, Rfl: 1   denosumab (PROLIA) 60 MG/ML SOSY injection, 60mg  Subcutaneous every 6 months 180 days (Patient not taking: Reported on 04/07/2023), Disp: 1 mL, Rfl: 1   denosumab (PROLIA) 60 MG/ML SOSY injection, 60mg  Subcutaneous every 6 months 180 days, Disp: 1 mL, Rfl: 1   diclofenac Sodium (VOLTAREN) 1 % GEL, APPLY (1) GRAM TO AFFECTED AREA DAILY. (Patient taking differently: Apply 1 g topically daily.), Disp: 100 g, Rfl: 0   ferrous sulfate 325 (65 FE) MG EC tablet, Take 1 tablet (325 mg total) by mouth daily., Disp: 60 tablet, Rfl: 3   furosemide (LASIX) 20 MG tablet, Take 1 tablet (20 mg total) by mouth 2 (two) times daily. (Patient taking differently: Take 20 mg by mouth daily as needed for fluid.), Disp: 180 tablet, Rfl: 3   levothyroxine (SYNTHROID) 50 MCG tablet, Take 1 tablet (50 mcg total) by mouth daily., Disp: 30 tablet, Rfl: 1   midodrine (PROAMATINE) 2.5 MG tablet, Take 3 tablets (7.5 mg total) by mouth 3 (three) times daily with meals., Disp: 270 tablet, Rfl: 0   Multiple Vitamin  (MULTIVITAMIN WITH MINERALS)  TABS tablet, Take 1 tablet by mouth every morning., Disp: , Rfl:    naloxone (NARCAN) nasal spray 4 mg/0.1 mL, Place 1 spray into the nose as needed (opioid reversal)., Disp: , Rfl:    Omega-3 Fatty Acids (FISH OIL PO), Take 1 capsule by mouth daily., Disp: , Rfl:    Omega-3 Fatty Acids (FISH OIL) 1000 MG CAPS, Take by mouth., Disp: , Rfl:    omeprazole (PRILOSEC) 40 MG capsule, Take 1 capsule (40 mg total) by mouth daily before breakfast. Open capsule and swallow granules with liquid or applesauce, Disp: 90 capsule, Rfl: 3   oxybutynin (DITROPAN XL) 10 MG 24 hr tablet, Take 1 tablet (10 mg total) by mouth at bedtime. (Patient not taking: Reported on 04/07/2023), Disp: 30 tablet, Rfl: 1   oxybutynin (DITROPAN-XL) 10 MG 24 hr tablet, Take by mouth. (Patient not taking: Reported on 04/07/2023), Disp: , Rfl:    polyethylene glycol (MIRALAX / GLYCOLAX) 17 g packet, Take 17 g by mouth daily., Disp: 14 each, Rfl: 0   potassium chloride SA (KLOR-CON M) 20 MEQ tablet, Take 1 tablet (20 mEq total) by mouth daily. TAKE 1 TABLET (20 MEQ) BY MOUTH DAILY PRN with Lasix (EDEMA), Disp: 90 tablet, Rfl: 3   Allergies: Allergies  Allergen Reactions   Macrobid [Nitrofurantoin] Other (See Comments)    Stomach pain    REVIEW OF SYSTEMS:   Review of Systems  Constitutional:  Negative for chills, fatigue and fever.  HENT:   Negative for lump/mass, mouth sores, nosebleeds, sore throat and trouble swallowing.   Eyes:  Negative for eye problems.  Respiratory:  Negative for cough and shortness of breath.   Cardiovascular:  Negative for chest pain, leg swelling and palpitations.  Gastrointestinal:  Negative for abdominal pain, constipation, diarrhea, nausea and vomiting.  Genitourinary:  Negative for bladder incontinence, difficulty urinating, dysuria, frequency, hematuria and nocturia.   Musculoskeletal:  Negative for arthralgias, back pain, flank pain, myalgias and neck pain.  Skin:   Negative for itching and rash.  Neurological:  Negative for dizziness, headaches and numbness.  Hematological:  Does not bruise/bleed easily.  Psychiatric/Behavioral:  Negative for depression, sleep disturbance and suicidal ideas. The patient is not nervous/anxious.   All other systems reviewed and are negative.    VITALS:   There were no vitals taken for this visit.  Wt Readings from Last 3 Encounters:  04/10/23 154 lb 15.7 oz (70.3 kg)  03/30/23 151 lb 6.4 oz (68.7 kg)  03/11/23 145 lb 8.1 oz (66 kg)    There is no height or weight on file to calculate BMI.  Performance status (ECOG): {CHL ONC [id number]  PHYSICAL EXAM:   Physical Exam Vitals and nursing note reviewed. Exam conducted with a chaperone present.  Constitutional:      Appearance: Normal appearance.  Cardiovascular:     Rate and Rhythm: Normal rate and regular rhythm.     Pulses: Normal pulses.     Heart sounds: Normal heart sounds.  Pulmonary:     Effort: Pulmonary effort is normal.     Breath sounds: Normal breath sounds.  Abdominal:     Palpations: Abdomen is soft. There is no hepatomegaly, splenomegaly or mass.     Tenderness: There is no abdominal tenderness.  Musculoskeletal:     Right lower leg: No edema.     Left lower leg: No edema.  Lymphadenopathy:     Cervical: No cervical adenopathy.     Right cervical: No superficial, deep or posterior  cervical adenopathy.    Left cervical: No superficial, deep or posterior cervical adenopathy.     Upper Body:     Right upper body: No supraclavicular or axillary adenopathy.     Left upper body: No supraclavicular or axillary adenopathy.  Neurological:     General: No focal deficit present.     Mental Status: She is alert and oriented to person, place, and time.  Psychiatric:        Mood and Affect: Mood normal.        Behavior: Behavior normal.     LABS:      Latest Ref Rng & Units 04/11/2023    4:25 AM 04/10/2023    3:54 AM 04/08/2023    4:44 AM   CBC  WBC 4.0 - 10.5 K/uL 4.8  5.0  4.8   Hemoglobin 12.0 - 15.0 g/dL 8.0  7.8  8.2   Hematocrit 36.0 - 46.0 % 24.4  24.0  25.7   Platelets 150 - 400 K/uL 165  172  169       Latest Ref Rng & Units 04/12/2023    5:01 AM 04/11/2023    4:25 AM 04/10/2023    5:15 PM  CMP  Glucose 70 - 99 mg/dL 71  82  95   BUN 8 - 23 mg/dL 9  9  10    Creatinine 0.44 - 1.00 mg/dL 4.09  8.11  9.14   Sodium 135 - 145 mmol/L 135  134  131   Potassium 3.5 - 5.1 mmol/L 3.8  3.6  3.9   Chloride 98 - 111 mmol/L 108  104  104   CO2 22 - 32 mmol/L 20  23  21    Calcium 8.9 - 10.3 mg/dL 9.4  9.7  9.9   Total Protein 6.5 - 8.1 g/dL 7.7  7.5    Total Bilirubin 0.3 - 1.2 mg/dL 0.3  0.4    Alkaline Phos 38 - 126 U/L 56  60    AST 15 - 41 U/L 25  25    ALT 0 - 44 U/L 20  21       No results found for: "CEA1", "CEA" / No results found for: "CEA1", "CEA" No results found for: "PSA1" No results found for: "NWG956" No results found for: "CAN125"  Lab Results  Component Value Date   TOTALPROTELP 9.3 (H) 04/07/2023   TOTALPROTELP 8.7 (H) 04/07/2023   ALBUMINELP 2.2 (L) 04/07/2023   A1GS 0.3 04/07/2023   A2GS 0.6 04/07/2023   BETS 5.5 (H) 04/07/2023   GAMS 0.2 (L) 04/07/2023   MSPIKE 5.1 (H) 04/07/2023   SPEI Comment 04/07/2023   Lab Results  Component Value Date   TIBC 184 (L) 01/26/2023   TIBC 305 01/10/2022   FERRITIN 101 01/26/2023   FERRITIN 18 01/10/2022   IRONPCTSAT 36 (H) 01/26/2023   IRONPCTSAT 21 01/10/2022   Lab Results  Component Value Date   LDH 200 (H) 04/08/2023     STUDIES:   CT BONE MARROW BIOPSY & ASPIRATION  Result Date: 04/08/2023 CLINICAL DATA:  Myeloma EXAM: CT GUIDED DEEP ILIAC BONE MARROW ASPIRATION AND CORE BIOPSY TECHNIQUE: Patient was placed prone on the CT gantry and limited axial scans through the pelvis were obtained. This exam was performed according to the departmental dose-optimization program which includes automated exposure control, adjustment of the mA and/or kV  according to patient size and/or use of iterative reconstruction technique. Appropriate skin entry site was identified. Skin site was marked, prepped  with chlorhexidine, draped in usual sterile fashion, and infiltrated locally with 1% lidocaine. Intravenous Fentanyl and Versed 0.5mg  were administered as conscious sedation during continuous monitoring of the patient's level of consciousness and physiological / cardiorespiratory status by the radiology RN, with a total moderate sedation time of 15 minutes. Under CT fluoroscopic guidance an 11-gauge Cook trocar bone needle was advanced into the RIGHT iliac bone just lateral to the sacroiliac joint. Once needle tip position was confirmed, core and aspiration samples were obtained, submitted to pathology for approval. Patient tolerated procedure well. COMPLICATIONS: COMPLICATIONS none IMPRESSION: 1. Technically successful CT guided right iliac bone core and aspiration biopsy. Electronically Signed   By: Corlis Leak M.D.   On: 04/08/2023 17:29   CT L-SPINE NO CHARGE  Result Date: 04/07/2023 CLINICAL DATA:  Pt bib [facility] from home, for recurrent falls and generalized weakness- pt was hospitalized and placed in rehab for this same issue and discharged back home 2 week EXAM: CT LUMBAR SPINE WITHOUT CONTRAST TECHNIQUE: Multidetector CT imaging of the lumbar spine was performed without intravenous contrast administration. Multiplanar CT image reconstructions were also generated. RADIATION DOSE REDUCTION: This exam was performed according to the departmental dose-optimization program which includes automated exposure control, adjustment of the mA and/or kV according to patient size and/or use of iterative reconstruction technique. COMPARISON:  CT abdomen pelvis 04/06/2023 FINDINGS: Segmentation: 5 lumbar type vertebrae. Alignment: Normal. Vertebrae: Diffusely decreased bone density. Multilevel moderate to severe degenerative changes of the spine. Posterior disc  osteophyte complex formation at the L3-L4, L4-L5, L5-S1 levels. No acute fracture or focal pathologic process. Paraspinal and other soft tissues: Negative. Disc levels: Multilevel intervertebral disc space narrowing with fusion at the L2-L3 level and intervertebral disc space vacuum phenomenon at the T12-L1, L4-L5, L5-S1 levels. Other: Please see separately dictated CT abdomen pelvis 04/06/2023 IMPRESSION: 1. No acute displaced fracture or traumatic listhesis of the lumbar spine. 2. Diffusely decreased bone density. 3. Multilevel severe degenerative changes of the lumbar spine. Electronically Signed   By: Tish Frederickson M.D.   On: 04/07/2023 00:26   CT ABDOMEN PELVIS W CONTRAST  Result Date: 04/06/2023 CLINICAL DATA:  Pancreatitis, acute, severe. Recurrent falls. Generalized weakness. EXAM: CT ABDOMEN AND PELVIS WITH CONTRAST TECHNIQUE: Multidetector CT imaging of the abdomen and pelvis was performed using the standard protocol following bolus administration of intravenous contrast. RADIATION DOSE REDUCTION: This exam was performed according to the departmental dose-optimization program which includes automated exposure control, adjustment of the mA and/or kV according to patient size and/or use of iterative reconstruction technique. CONTRAST:  OMNIPAQUE IOHEXOL 300 MG/ML  SOLN COMPARISON:  03/11/2023 FINDINGS: Lower chest: Bibasilar atelectasis or scarring, similar prior study. No effusions. Cardiomegaly. Hepatobiliary: Low-density adjacent to the falciform ligament, likely focal fatty infiltration. Vascular shunting/malformation again seen in the posterior right hepatic lobe, stable. No focal hepatic abnormality. Prior cholecystectomy. Slight intrahepatic and extrahepatic biliary ductal dilatation likely related to age and post cholecystectomy state. Pancreas: Edema/stranding around the pancreas is similar prior study suggesting acute pancreatitis. Spleen: No focal abnormality.  Normal size.  Adrenals/Urinary Tract: 5 mm left midpole renal stone. No hydronephrosis bilaterally. No suspicious renal or adrenal mass. Urinary bladder is obscured by beam hardening artifact from bilateral hip replacements. Stomach/Bowel: Diffuse colonic diverticulosis, most pronounced in the left colon. Postoperative changes from gastric bypass. Small bowel decompressed. Vascular/Lymphatic: No evidence of aneurysm or adenopathy. Reproductive: Obscured by beam hardening artifact from bilateral hip replacements. Other: Trace free fluid in the cul-de-sac.  No free  air. Musculoskeletal: No acute bony abnormality. IMPRESSION: Stranding/edema around the pancreas suggesting acute pancreatitis. This is similar prior study. Focal fatty infiltration of the liver adjacent to the falciform ligament. Prior gastric bypass.  No complicating feature. Left nephrolithiasis.  No hydronephrosis. Colonic diverticulosis. Electronically Signed   By: Charlett Nose M.D.   On: 04/06/2023 23:40   CT Head Wo Contrast  Result Date: 04/06/2023 CLINICAL DATA:  Mental status change, unknown cause EXAM: CT HEAD WITHOUT CONTRAST TECHNIQUE: Contiguous axial images were obtained from the base of the skull through the vertex without intravenous contrast. RADIATION DOSE REDUCTION: This exam was performed according to the departmental dose-optimization program which includes automated exposure control, adjustment of the mA and/or kV according to patient size and/or use of iterative reconstruction technique. COMPARISON:  MRI head 03/11/2023 FINDINGS: Brain: Patchy and confluent areas of decreased attenuation are noted throughout the deep and periventricular white matter of the cerebral hemispheres bilaterally, compatible with chronic microvascular ischemic disease. No evidence of large-territorial acute infarction. No parenchymal hemorrhage. No mass lesion. No extra-axial collection. No mass effect or midline shift. No hydrocephalus. Basilar cisterns are patent.  Vascular: No hyperdense vessel. Atherosclerotic calcifications are present within the cavernous internal carotid arteries. Skull: No acute fracture or focal lesion. Sinuses/Orbits: Paranasal sinuses and mastoid air cells are clear. Bilateral lens replacement. Otherwise the orbits are unremarkable. Other: None. IMPRESSION: No acute intracranial abnormality. Electronically Signed   By: Tish Frederickson M.D.   On: 04/06/2023 23:32   DG Chest Port 1 View  Result Date: 04/06/2023 CLINICAL DATA:  weakness. EXAM: PORTABLE CHEST 1 VIEW COMPARISON:  03/11/2023. FINDINGS: Increased interstitial markings are essentially similar to the prior study. No acute consolidation or major lung collapse. Bilateral costophrenic angles are clear. Stable cardio-mediastinal silhouette. Aortic arch calcifications noted. No acute osseous abnormalities. Note is made of bilateral shoulder arthroplasty hardware. The soft tissues are within normal limits. IMPRESSION: No acute cardiopulmonary process. Electronically Signed   By: Jules Schick M.D.   On: 04/06/2023 18:05

## 2023-04-19 ENCOUNTER — Encounter (HOSPITAL_COMMUNITY): Payer: Self-pay | Admitting: Hematology

## 2023-04-19 ENCOUNTER — Inpatient Hospital Stay (HOSPITAL_BASED_OUTPATIENT_CLINIC_OR_DEPARTMENT_OTHER): Payer: 59 | Admitting: Hematology

## 2023-04-19 ENCOUNTER — Encounter: Payer: Self-pay | Admitting: Hematology

## 2023-04-19 DIAGNOSIS — Z5112 Encounter for antineoplastic immunotherapy: Secondary | ICD-10-CM | POA: Diagnosis not present

## 2023-04-19 DIAGNOSIS — C9000 Multiple myeloma not having achieved remission: Secondary | ICD-10-CM | POA: Diagnosis not present

## 2023-04-19 NOTE — Progress Notes (Signed)
START ON PATHWAY REGIMEN - Multiple Myeloma and Other Plasma Cell Dyscrasias     Cycles 1 and 2: A cycle is every 28 days:     Lenalidomide      Dexamethasone      Daratumumab and hyaluronidase-fihj    Cycles 3 through 6: A cycle is every 28 days:     Lenalidomide      Dexamethasone      Daratumumab and hyaluronidase-fihj    Cycles 7 and beyond: A cycle is every 28 days:     Lenalidomide      Dexamethasone      Daratumumab and hyaluronidase-fihj   **Always confirm dose/schedule in your pharmacy ordering system**  Patient Characteristics: Multiple Myeloma, Newly Diagnosed, Transplant Ineligible or Refused, Unknown or Awaiting Test Results Disease Classification: Multiple Myeloma Therapeutic Status: Newly Diagnosed R2-ISS Staging: II Is Patient Eligible for Transplant<= Transplant Ineligible or Refused Risk Status: Unknown Intent of Therapy: Non-Curative / Palliative Intent, Discussed with Patient

## 2023-04-19 NOTE — Patient Instructions (Addendum)
[facility] at [facility] Discharge Instructions   You were seen and examined today by Dr. Ellin Saba.  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   He talked to you about treatment of this multiple myeloma with a three drug regimen. One of the medications is called dexamethasone. It is a steroid that is taken once a week. You will take 5 pills once a week. The other medication is called Revlimid. This medication comes from a specialty pharmacy and is delivered to your home. You take this pill every day for 3 weeks in a row with 1 week off. After the 1 week off, you repeat the process of taking for 3 weeks in a row with 1 week off. The other medication is an injection you will receive in the clinic. This medication is called Darzalex. You will initially come weekly to the clinic to receive this medication. Eventually you will receive the injection every other week for a certain amount of time, then the injection will be given monthly.   You will need to be on a medication called acyclovir. This is to help prevent reactivation of the shingles virus. We will send this to your regular pharmacy. You will also need to take a baby aspirin (81 mg) daily. You can get this at your pharmacy over the counter.   We will plan to start your treatment next week.   Return as scheduled.    Thank you for choosing [facility] at [facility] to provide your oncology and hematology care.  To afford each patient quality time with our provider, please arrive at least 15 minutes before your scheduled appointment time.   If you have a lab appointment with the Cancer Center please come in thru the Main Entrance and check in at the main information desk.  You need to re-schedule your appointment should you arrive 10 or more minutes late.  We strive to give you quality time with our providers,  and arriving late affects you and other patients whose appointments are after yours.  Also, if you no show three or more times for appointments you may be dismissed from the clinic at the providers discretion.     Again, thank you for choosing [facility].  Our hope is that these requests will decrease the amount of time that you wait before being seen by our physicians.       _____________________________________________________________  Should you have questions after your visit to [facility], please contact our office at [phone] and follow the prompts.  Our office hours are 8:00 a.m. and 4:30 p.m. Monday - Friday.  Please note that voicemails left after 4:00 p.m. may not be returned until the following business day.  We are closed weekends and major holidays.  You do have access to a nurse 24-7, just call the main number to the clinic [phone] and do not press any options, hold on the line and a nurse will answer the phone.    For prescription refill requests, have your pharmacy contact our office and allow 72 hours.    Due to Covid, you will need to wear a mask upon entering the hospital. If you do not have a mask, a mask will be given to you at the Main Entrance upon arrival. For doctor visits, patients may have 1 support person age 42 or  older with them. For treatment visits, patients can not have anyone with them due to social distancing guidelines and our immunocompromised population.

## 2023-04-20 ENCOUNTER — Telehealth: Payer: Self-pay

## 2023-04-20 ENCOUNTER — Other Ambulatory Visit (HOSPITAL_COMMUNITY): Payer: Self-pay

## 2023-04-20 ENCOUNTER — Other Ambulatory Visit: Payer: Self-pay

## 2023-04-20 ENCOUNTER — Telehealth: Payer: Self-pay | Admitting: Pharmacist

## 2023-04-20 ENCOUNTER — Encounter (HOSPITAL_COMMUNITY): Payer: Self-pay

## 2023-04-20 DIAGNOSIS — I959 Hypotension, unspecified: Secondary | ICD-10-CM | POA: Diagnosis not present

## 2023-04-20 DIAGNOSIS — C9000 Multiple myeloma not having achieved remission: Secondary | ICD-10-CM

## 2023-04-20 DIAGNOSIS — R571 Hypovolemic shock: Secondary | ICD-10-CM | POA: Diagnosis not present

## 2023-04-20 DIAGNOSIS — M6281 Muscle weakness (generalized): Secondary | ICD-10-CM | POA: Diagnosis not present

## 2023-04-20 DIAGNOSIS — M545 Low back pain, unspecified: Secondary | ICD-10-CM | POA: Diagnosis not present

## 2023-04-20 DIAGNOSIS — E860 Dehydration: Secondary | ICD-10-CM | POA: Diagnosis not present

## 2023-04-20 DIAGNOSIS — M810 Age-related osteoporosis without current pathological fracture: Secondary | ICD-10-CM | POA: Diagnosis not present

## 2023-04-20 MED ORDER — LENALIDOMIDE 10 MG PO CAPS: 10.0000 mg | ORAL_CAPSULE | Freq: Every day | ORAL | 0 refills | Status: DC

## 2023-04-20 NOTE — Telephone Encounter (Signed)
Oral Oncology Patient Advocate Encounter  After completing a benefits investigation, prior authorization for Lenalidomide is not required at this time through [employer].  Patient's copay is $0.00.     Ardeen Fillers, CPhT Oncology Pharmacy Patient Advocate  [facility]  [phone] (phone) [phone] (fax) 04/20/2023 1:45 PM

## 2023-04-20 NOTE — Telephone Encounter (Signed)
Pharmacy Student Encounter   Received new prescription for Revlimid (lenalidomide) for the treatment of multiple myeloma in conjunction with dexamethasone and Darzalex, planned duration until disease control or unacceptable toxicity.  Labs from [facility] on 04/12/2023 and CBC 04/11/2023 assessed, CrCl of ~50 mL/min, Patient qualifies for dose adjustment. MD has adjusted the dose to 10 mg. Prescription dose and frequency assessed.   Current medication list in Epic reviewed, 0 DDIs with Revlimid identified  Evaluated chart and no patient barriers to medication adherence identified.   Prescription has been e-scribed to the [facility] for benefits analysis and approval.  Oral Oncology Clinic will continue to follow for insurance authorization, copayment issues, initial counseling and start date.  Patient agreed to treatment on 04/19/2023 per MD documentation.  Jaymes Graff PharmD Candidate [address] [place] [phone]  04/20/2023 12:45 PM

## 2023-04-21 ENCOUNTER — Other Ambulatory Visit: Payer: Self-pay | Admitting: *Deleted

## 2023-04-21 DIAGNOSIS — C9000 Multiple myeloma not having achieved remission: Secondary | ICD-10-CM | POA: Diagnosis not present

## 2023-04-21 DIAGNOSIS — M6281 Muscle weakness (generalized): Secondary | ICD-10-CM | POA: Diagnosis not present

## 2023-04-21 DIAGNOSIS — M069 Rheumatoid arthritis, unspecified: Secondary | ICD-10-CM | POA: Diagnosis not present

## 2023-04-21 DIAGNOSIS — E039 Hypothyroidism, unspecified: Secondary | ICD-10-CM | POA: Diagnosis not present

## 2023-04-21 DIAGNOSIS — E876 Hypokalemia: Secondary | ICD-10-CM | POA: Diagnosis not present

## 2023-04-21 DIAGNOSIS — G4700 Insomnia, unspecified: Secondary | ICD-10-CM | POA: Diagnosis not present

## 2023-04-21 DIAGNOSIS — M810 Age-related osteoporosis without current pathological fracture: Secondary | ICD-10-CM | POA: Diagnosis not present

## 2023-04-21 DIAGNOSIS — E860 Dehydration: Secondary | ICD-10-CM | POA: Diagnosis not present

## 2023-04-21 DIAGNOSIS — R571 Hypovolemic shock: Secondary | ICD-10-CM | POA: Diagnosis not present

## 2023-04-21 DIAGNOSIS — I959 Hypotension, unspecified: Secondary | ICD-10-CM | POA: Diagnosis not present

## 2023-04-21 DIAGNOSIS — K219 Gastro-esophageal reflux disease without esophagitis: Secondary | ICD-10-CM | POA: Diagnosis not present

## 2023-04-21 NOTE — Patient Outreach (Addendum)
Ms. Kathy [name] resides in [facility] skilled nursing facility.  Ms. Kathy [name] is active with care coordination/chronic care management team.   Attempted to reach Kathy [name], SNF social worker by phone. She was unavailable. Secure communication sent to [facility] social worker to inquire about transition plans/date and to confirm she is aware hospital bed is request.  Telephone call made to Ms. Kathy [name] [phone]. Patient identifiers confirmed. Ms. Kathy [name] reports she plans to return home upon SNF discharge. States she thinks she is going home on 04/29/23 but is not sure. Ms. Kathy [name] reports she is going to need some more help at home. States she doesn't have full Medicaid. States she has Medicaid that assists with co-pays. Ms. Kathy [name] states a friend assists her some at home and she uses transportation thru her insurance. Ms. Kathy [name] reports she wants a hospital bed for home. Discussed the social worker at the facility will be the one to order it.  Discussed Clinical research associate will continue to follow along and will keep RN CM updated. Writer will continue to attempt to collaborate with SNF social worker.   Raiford Noble, MSN, RN, BSN [place]  [facility], Healthy Communities RN Post- Acute Care Coordinator Direct Dial: [phone]

## 2023-04-22 DIAGNOSIS — M545 Low back pain, unspecified: Secondary | ICD-10-CM | POA: Diagnosis not present

## 2023-04-22 DIAGNOSIS — I959 Hypotension, unspecified: Secondary | ICD-10-CM | POA: Diagnosis not present

## 2023-04-22 DIAGNOSIS — E039 Hypothyroidism, unspecified: Secondary | ICD-10-CM | POA: Diagnosis not present

## 2023-04-22 DIAGNOSIS — K219 Gastro-esophageal reflux disease without esophagitis: Secondary | ICD-10-CM | POA: Diagnosis not present

## 2023-04-22 DIAGNOSIS — R571 Hypovolemic shock: Secondary | ICD-10-CM | POA: Diagnosis not present

## 2023-04-22 DIAGNOSIS — E860 Dehydration: Secondary | ICD-10-CM | POA: Diagnosis not present

## 2023-04-22 DIAGNOSIS — C9000 Multiple myeloma not having achieved remission: Secondary | ICD-10-CM | POA: Diagnosis not present

## 2023-04-22 DIAGNOSIS — M6281 Muscle weakness (generalized): Secondary | ICD-10-CM | POA: Diagnosis not present

## 2023-04-23 ENCOUNTER — Encounter (HOSPITAL_COMMUNITY): Payer: Self-pay

## 2023-04-25 DIAGNOSIS — C9000 Multiple myeloma not having achieved remission: Secondary | ICD-10-CM | POA: Diagnosis not present

## 2023-04-25 DIAGNOSIS — M069 Rheumatoid arthritis, unspecified: Secondary | ICD-10-CM | POA: Diagnosis not present

## 2023-04-25 DIAGNOSIS — M6281 Muscle weakness (generalized): Secondary | ICD-10-CM | POA: Diagnosis not present

## 2023-04-25 DIAGNOSIS — K219 Gastro-esophageal reflux disease without esophagitis: Secondary | ICD-10-CM | POA: Diagnosis not present

## 2023-04-25 DIAGNOSIS — I959 Hypotension, unspecified: Secondary | ICD-10-CM | POA: Diagnosis not present

## 2023-04-25 DIAGNOSIS — E039 Hypothyroidism, unspecified: Secondary | ICD-10-CM | POA: Diagnosis not present

## 2023-04-25 DIAGNOSIS — E876 Hypokalemia: Secondary | ICD-10-CM | POA: Diagnosis not present

## 2023-04-25 DIAGNOSIS — R571 Hypovolemic shock: Secondary | ICD-10-CM | POA: Diagnosis not present

## 2023-04-25 DIAGNOSIS — E860 Dehydration: Secondary | ICD-10-CM | POA: Diagnosis not present

## 2023-04-25 DIAGNOSIS — M545 Low back pain, unspecified: Secondary | ICD-10-CM | POA: Diagnosis not present

## 2023-04-25 DIAGNOSIS — M810 Age-related osteoporosis without current pathological fracture: Secondary | ICD-10-CM | POA: Diagnosis not present

## 2023-04-25 NOTE — Progress Notes (Signed)
Pharmacist Chemotherapy Monitoring - Initial Assessment    Anticipated start date: 04/26/23   The following has been reviewed per standard work regarding the patient's treatment regimen: The patient's diagnosis, treatment plan and drug doses, and organ/hematologic function Lab orders and baseline tests specific to treatment regimen  The treatment plan start date, drug sequencing, and pre-medications Prior authorization status  Patient's documented medication list, including drug-drug interaction screen and prescriptions for anti-emetics and supportive care specific to the treatment regimen The drug concentrations, fluid compatibility, administration routes, and timing of the medications to be used The patient's access for treatment and lifetime cumulative dose history, if applicable  The patient's medication allergies and previous infusion related reactions, if applicable   Changes made to treatment plan:  pre-medications cetirizine  Follow up needed:  N/A  Pharmacy has substituted cetirizine 10 mg orally x 1 as premedication for   Loratidine discontinued.   Stephens Shire, [facility], 04/25/2023  3:55 PM

## 2023-04-26 ENCOUNTER — Inpatient Hospital Stay: Payer: 59

## 2023-04-26 VITALS — BP 108/69 | HR 72 | Temp 97.5°F | Resp 18

## 2023-04-26 DIAGNOSIS — C9000 Multiple myeloma not having achieved remission: Secondary | ICD-10-CM

## 2023-04-26 DIAGNOSIS — Z5112 Encounter for antineoplastic immunotherapy: Secondary | ICD-10-CM | POA: Diagnosis not present

## 2023-04-26 DIAGNOSIS — E876 Hypokalemia: Secondary | ICD-10-CM

## 2023-04-26 LAB — COMPREHENSIVE METABOLIC PANEL
ALT: 17 U/L (ref 0–44)
AST: 31 U/L (ref 15–41)
Albumin: <1.5 g/dL — ABNORMAL LOW (ref 3.5–5.0)
Alkaline Phosphatase: 97 U/L (ref 38–126)
Anion gap: 10 (ref 5–15)
BUN: 18 mg/dL (ref 8–23)
CO2: 28 mmol/L (ref 22–32)
Calcium: 11.5 mg/dL — ABNORMAL HIGH (ref 8.9–10.3)
Chloride: 99 mmol/L (ref 98–111)
Creatinine, Ser: 0.89 mg/dL (ref 0.44–1.00)
GFR, Estimated: >60 mL/min (ref >60)
Glucose, Bld: 77 mg/dL (ref 70–99)
Potassium: 2.9 mmol/L — ABNORMAL LOW (ref 3.5–5.1)
Sodium: 137 mmol/L (ref 135–145)
Total Bilirubin: 0.4 mg/dL (ref 0.3–1.2)
Total Protein: 9.6 g/dL — ABNORMAL HIGH (ref 6.5–8.1)

## 2023-04-26 LAB — CBC WITH DIFFERENTIAL/PLATELET
Abs Immature Granulocytes: 0.03 10*3/uL (ref 0.00–0.07)
Basophils Absolute: 0 10*3/uL (ref 0.0–0.1)
Basophils Relative: 0 %
Eosinophils Absolute: 0 10*3/uL (ref 0.0–0.5)
Eosinophils Relative: 1 %
HCT: 26.9 % — ABNORMAL LOW (ref 36.0–46.0)
Hemoglobin: 9 g/dL — ABNORMAL LOW (ref 12.0–15.0)
Immature Granulocytes: 1 %
Lymphocytes Relative: 47 %
Lymphs Abs: 2.3 10*3/uL (ref 0.7–4.0)
MCH: 33.6 pg (ref 26.0–34.0)
MCHC: 33.5 g/dL (ref 30.0–36.0)
MCV: 100.4 fL — ABNORMAL HIGH (ref 80.0–100.0)
Monocytes Absolute: 0.5 10*3/uL (ref 0.1–1.0)
Monocytes Relative: 11 %
Neutro Abs: 2 10*3/uL (ref 1.7–7.7)
Neutrophils Relative %: 40 %
Platelets: 152 10*3/uL (ref 150–400)
RBC: 2.68 MIL/uL — ABNORMAL LOW (ref 3.87–5.11)
RDW: 18.6 % — ABNORMAL HIGH (ref 11.5–15.5)
WBC: 4.9 10*3/uL (ref 4.0–10.5)
nRBC: 1.2 % — ABNORMAL HIGH (ref 0.0–0.2)

## 2023-04-26 LAB — TYPE AND SCREEN
ABO/RH(D): AB POS
Antibody Screen: NEGATIVE

## 2023-04-26 MED ORDER — CETIRIZINE HCL 10 MG PO TABS
10.0000 mg | ORAL_TABLET | Freq: Once | ORAL | Status: AC
  Administered 2023-04-26: 10 mg via ORAL
  Filled 2023-04-26: qty 1

## 2023-04-26 MED ORDER — POTASSIUM CHLORIDE CRYS ER 20 MEQ PO TBCR
40.0000 meq | EXTENDED_RELEASE_TABLET | Freq: Once | ORAL | Status: AC
  Administered 2023-04-26: 40 meq via ORAL
  Filled 2023-04-26: qty 2

## 2023-04-26 MED ORDER — SODIUM CHLORIDE 0.9 % IV SOLN: INTRAVENOUS | Status: DC

## 2023-04-26 MED ORDER — PROCHLORPERAZINE MALEATE 10 MG PO TABS: 10.0000 mg | ORAL_TABLET | Freq: Four times a day (QID) | ORAL | 1 refills | Status: DC | PRN

## 2023-04-26 MED ORDER — DEXAMETHASONE 4 MG PO TABS: 20.0000 mg | ORAL_TABLET | ORAL | 11 refills | Status: DC

## 2023-04-26 MED ORDER — ACETAMINOPHEN 325 MG PO TABS
650.0000 mg | ORAL_TABLET | Freq: Once | ORAL | Status: AC
  Administered 2023-04-26: 650 mg via ORAL
  Filled 2023-04-26: qty 2

## 2023-04-26 MED ORDER — MONTELUKAST SODIUM 10 MG PO TABS
10.0000 mg | ORAL_TABLET | Freq: Once | ORAL | Status: AC
  Administered 2023-04-26: 10 mg via ORAL
  Filled 2023-04-26: qty 1

## 2023-04-26 MED ORDER — DEXAMETHASONE 4 MG PO TABS
20.0000 mg | ORAL_TABLET | Freq: Once | ORAL | Status: AC
  Administered 2023-04-26: 20 mg via ORAL
  Filled 2023-04-26: qty 5

## 2023-04-26 MED ORDER — ACYCLOVIR 400 MG PO TABS: 400.0000 mg | ORAL_TABLET | Freq: Two times a day (BID) | ORAL | 11 refills | Status: DC

## 2023-04-26 MED ORDER — DARATUMUMAB-HYALURONIDASE-FIHJ 1800-30000 MG-UT/15ML ~~LOC~~ SOLN
1800.0000 mg | Freq: Once | SUBCUTANEOUS | Status: AC
  Administered 2023-04-26: 1800 mg via SUBCUTANEOUS
  Filled 2023-04-26: qty 15

## 2023-04-26 NOTE — Progress Notes (Signed)
Potassium 2.9 and Calcium 11.5 for D1C1 Darzalex.  Provider made aware.   Potassium 40 meq by mouth per standing orders Dr. Ellin Saba.   Give the patient 500 ml bolus, draw labs after saline bolus, give premeds for Darzalex,  and if unable to collect labs then the patient will not receive treatment today verbal order Rojelio Brenner, PA.    Patient tolerated Daratumumab injection with no complaints voiced.  See MAR for details.  Labs reviewed. Injection site clean and dry with no bruising or swelling noted at site.  Band aid applied.  Vss with discharge and left in satisfactory condition with no s/s of distress noted.

## 2023-04-26 NOTE — Patient Instructions (Signed)
[facility]  Discharge Instructions: Thank you for choosing [facility] to provide your oncology and hematology care.  If you have a lab appointment with the Cancer Center - please note that after April 8th, 2024, all labs will be drawn in the cancer center.  You do not have to check in or register with the main entrance as you have in the past but will complete your check-in in the cancer center.  Wear comfortable clothing and clothing appropriate for easy access to any Portacath or PICC line.   We strive to give you quality time with your provider. You may need to reschedule your appointment if you arrive late (15 or more minutes).  Arriving late affects you and other patients whose appointments are after yours.  Also, if you miss three or more appointments without notifying the office, you may be dismissed from the clinic at the provider's discretion.      For prescription refill requests, have your pharmacy contact our office and allow 72 hours for refills to be completed.    Today you received the following chemotherapy and/or immunotherapy agents daratumumab       To help prevent nausea and vomiting after your treatment, we encourage you to take your nausea medication as directed.  BELOW ARE SYMPTOMS THAT SHOULD BE REPORTED IMMEDIATELY: *FEVER GREATER THAN 100.4 F (38 C) OR HIGHER *CHILLS OR SWEATING *NAUSEA AND VOMITING THAT IS NOT CONTROLLED WITH YOUR NAUSEA MEDICATION *UNUSUAL SHORTNESS OF BREATH *UNUSUAL BRUISING OR BLEEDING *URINARY PROBLEMS (pain or burning when urinating, or frequent urination) *BOWEL PROBLEMS (unusual diarrhea, constipation, pain near the anus) TENDERNESS IN MOUTH AND THROAT WITH OR WITHOUT PRESENCE OF ULCERS (sore throat, sores in mouth, or a toothache) UNUSUAL RASH, SWELLING OR PAIN  UNUSUAL VAGINAL DISCHARGE OR ITCHING   Items with * indicate a potential emergency and should be followed up as soon as possible or go to  the Emergency Department if any problems should occur.  Please show the CHEMOTHERAPY ALERT CARD or IMMUNOTHERAPY ALERT CARD at check-in to the Emergency Department and triage nurse.  Should you have questions after your visit or need to cancel or reschedule your appointment, please contact [facility] [phone]  and follow the prompts.  Office hours are 8:00 a.m. to 4:30 p.m. Monday - Friday. Please note that voicemails left after 4:00 p.m. may not be returned until the following business day.  We are closed weekends and major holidays. You have access to a nurse at all times for urgent questions. Please call the main number to the clinic [phone] and follow the prompts.  For any non-urgent questions, you may also contact your provider using MyChart. We now offer e-Visits for anyone 30 and older to request care online for non-urgent symptoms. For details visit [email or web address].   Also download the MyChart app! Go to the app store, search "MyChart", open the app, select [place], and log in with your MyChart username and password.

## 2023-04-26 NOTE — Telephone Encounter (Signed)
Received notification from [facility] that patient requested they call back for counseling and scheduling of medication delivery on Thursday, 04/28/23, as they are currently in a Skilled Nursing Facility.    Ardeen Fillers, CPhT Oncology Pharmacy Patient Advocate  [facility]  [phone] (phone) [phone] (fax) 04/26/2023 9:12 AM

## 2023-04-26 NOTE — Progress Notes (Signed)
RN was able to get blood and is sending to lab for phenotype and T&C  Pryor Ochoa, PharmD

## 2023-04-28 ENCOUNTER — Other Ambulatory Visit: Payer: Self-pay

## 2023-04-28 ENCOUNTER — Other Ambulatory Visit: Payer: 59

## 2023-04-28 ENCOUNTER — Telehealth: Payer: Self-pay | Admitting: Internal Medicine

## 2023-04-28 ENCOUNTER — Ambulatory Visit: Payer: 59

## 2023-04-28 NOTE — Telephone Encounter (Signed)
Patient brother called asked if the provider Dr Allena Katz would reach out to him at [phone] about his sister. He is currently in [place].

## 2023-04-29 ENCOUNTER — Encounter (HOSPITAL_COMMUNITY): Payer: Self-pay

## 2023-04-29 ENCOUNTER — Other Ambulatory Visit: Payer: Self-pay | Admitting: *Deleted

## 2023-04-29 NOTE — Patient Outreach (Signed)
Per [facility] Ms. Ailes resides in [facility]. Ms. Coote was active with care coordination team RN prior.   Telephone call made to Ms. Howdyshell [phone]. Patient identifiers confirmed. Ms. Haegele reports she has now been moved to the long term care side. States it was not safe for her to return home and she will remain in [employer].  Attempted to reach [place], Economist. Karla left for the day. Secure message sent to Paula Compton to confirm Ms. Cargile remains in long term care.   Will update RN care coordinator.   Raiford Noble, MSN, RN, BSN [place]  [facility], Healthy Communities RN Post- Acute Care Coordinator Direct Dial: [phone]

## 2023-05-01 ENCOUNTER — Encounter: Payer: Self-pay | Admitting: Hematology

## 2023-05-01 DIAGNOSIS — R571 Hypovolemic shock: Secondary | ICD-10-CM | POA: Diagnosis not present

## 2023-05-01 DIAGNOSIS — C9000 Multiple myeloma not having achieved remission: Secondary | ICD-10-CM | POA: Diagnosis not present

## 2023-05-02 ENCOUNTER — Encounter: Payer: Self-pay | Admitting: Hematology

## 2023-05-02 ENCOUNTER — Other Ambulatory Visit: Payer: Self-pay

## 2023-05-02 DIAGNOSIS — I959 Hypotension, unspecified: Secondary | ICD-10-CM | POA: Diagnosis not present

## 2023-05-02 DIAGNOSIS — K5900 Constipation, unspecified: Secondary | ICD-10-CM | POA: Diagnosis not present

## 2023-05-02 DIAGNOSIS — D631 Anemia in chronic kidney disease: Secondary | ICD-10-CM | POA: Diagnosis not present

## 2023-05-02 DIAGNOSIS — M545 Low back pain, unspecified: Secondary | ICD-10-CM | POA: Diagnosis not present

## 2023-05-02 DIAGNOSIS — M6281 Muscle weakness (generalized): Secondary | ICD-10-CM | POA: Diagnosis not present

## 2023-05-02 DIAGNOSIS — K219 Gastro-esophageal reflux disease without esophagitis: Secondary | ICD-10-CM | POA: Diagnosis not present

## 2023-05-02 DIAGNOSIS — C9000 Multiple myeloma not having achieved remission: Secondary | ICD-10-CM

## 2023-05-03 ENCOUNTER — Inpatient Hospital Stay: Payer: 59 | Attending: Hematology

## 2023-05-03 ENCOUNTER — Inpatient Hospital Stay: Payer: 59

## 2023-05-03 VITALS — BP 101/56 | HR 73 | Temp 97.6°F | Resp 18

## 2023-05-03 DIAGNOSIS — Z79899 Other long term (current) drug therapy: Secondary | ICD-10-CM | POA: Diagnosis not present

## 2023-05-03 DIAGNOSIS — C9000 Multiple myeloma not having achieved remission: Secondary | ICD-10-CM | POA: Diagnosis not present

## 2023-05-03 DIAGNOSIS — Z5112 Encounter for antineoplastic immunotherapy: Secondary | ICD-10-CM | POA: Insufficient documentation

## 2023-05-03 LAB — CBC WITH DIFFERENTIAL/PLATELET
Abs Immature Granulocytes: 0.02 10*3/uL (ref 0.00–0.07)
Basophils Absolute: 0 10*3/uL (ref 0.0–0.1)
Basophils Relative: 0 %
Eosinophils Absolute: 0 10*3/uL (ref 0.0–0.5)
Eosinophils Relative: 1 %
HCT: 23.5 % — ABNORMAL LOW (ref 36.0–46.0)
Hemoglobin: 7.7 g/dL — ABNORMAL LOW (ref 12.0–15.0)
Immature Granulocytes: 1 %
Lymphocytes Relative: 42 %
Lymphs Abs: 1.3 10*3/uL (ref 0.7–4.0)
MCH: 33 pg (ref 26.0–34.0)
MCHC: 32.8 g/dL (ref 30.0–36.0)
MCV: 100.9 fL — ABNORMAL HIGH (ref 80.0–100.0)
Monocytes Absolute: 0.4 10*3/uL (ref 0.1–1.0)
Monocytes Relative: 12 %
Neutro Abs: 1.4 10*3/uL — ABNORMAL LOW (ref 1.7–7.7)
Neutrophils Relative %: 44 %
Platelets: 127 10*3/uL — ABNORMAL LOW (ref 150–400)
RBC: 2.33 MIL/uL — ABNORMAL LOW (ref 3.87–5.11)
RDW: 18.5 % — ABNORMAL HIGH (ref 11.5–15.5)
WBC: 3.1 10*3/uL — ABNORMAL LOW (ref 4.0–10.5)
nRBC: 0 % (ref 0.0–0.2)

## 2023-05-03 LAB — COMPREHENSIVE METABOLIC PANEL
ALT: 13 U/L (ref 0–44)
AST: 21 U/L (ref 15–41)
Albumin: <1.5 g/dL — ABNORMAL LOW (ref 3.5–5.0)
Alkaline Phosphatase: 94 U/L (ref 38–126)
Anion gap: 8 (ref 5–15)
BUN: 12 mg/dL (ref 8–23)
CO2: 28 mmol/L (ref 22–32)
Calcium: 10.7 mg/dL — ABNORMAL HIGH (ref 8.9–10.3)
Chloride: 97 mmol/L — ABNORMAL LOW (ref 98–111)
Creatinine, Ser: 0.77 mg/dL (ref 0.44–1.00)
GFR, Estimated: >60 mL/min (ref >60)
Glucose, Bld: 78 mg/dL (ref 70–99)
Potassium: 2.5 mmol/L — CL (ref 3.5–5.1)
Sodium: 133 mmol/L — ABNORMAL LOW (ref 135–145)
Total Bilirubin: 0.4 mg/dL (ref 0.3–1.2)
Total Protein: 8.6 g/dL — ABNORMAL HIGH (ref 6.5–8.1)

## 2023-05-03 LAB — MAGNESIUM: Magnesium: 1.4 mg/dL — ABNORMAL LOW (ref 1.7–2.4)

## 2023-05-03 MED ORDER — DEXAMETHASONE 4 MG PO TABS
20.0000 mg | ORAL_TABLET | Freq: Once | ORAL | Status: AC
  Administered 2023-05-03: 20 mg via ORAL
  Filled 2023-05-03: qty 5

## 2023-05-03 MED ORDER — SODIUM CHLORIDE 0.9 % IV SOLN: INTRAVENOUS | Status: DC

## 2023-05-03 MED ORDER — MONTELUKAST SODIUM 10 MG PO TABS
10.0000 mg | ORAL_TABLET | Freq: Once | ORAL | Status: AC
  Administered 2023-05-03: 10 mg via ORAL
  Filled 2023-05-03: qty 1

## 2023-05-03 MED ORDER — MAGNESIUM SULFATE 2 GM/50ML IV SOLN
2.0000 g | Freq: Once | INTRAVENOUS | Status: AC
  Administered 2023-05-03: 2 g via INTRAVENOUS
  Filled 2023-05-03: qty 50

## 2023-05-03 MED ORDER — ZOLEDRONIC ACID 4 MG/100ML IV SOLN
4.0000 mg | Freq: Once | INTRAVENOUS | Status: AC
  Administered 2023-05-03: 4 mg via INTRAVENOUS
  Filled 2023-05-03: qty 100

## 2023-05-03 MED ORDER — POTASSIUM CHLORIDE IN NACL 20-0.45 MEQ/L-% IV SOLN: Freq: Once | INTRAVENOUS | Status: DC

## 2023-05-03 MED ORDER — ACETAMINOPHEN 325 MG PO TABS
650.0000 mg | ORAL_TABLET | Freq: Once | ORAL | Status: AC
  Administered 2023-05-03: 650 mg via ORAL
  Filled 2023-05-03: qty 2

## 2023-05-03 MED ORDER — POTASSIUM CHLORIDE IN NACL 20-0.9 MEQ/L-% IV SOLN
Freq: Once | INTRAVENOUS | Status: AC
  Filled 2023-05-03: qty 1000

## 2023-05-03 MED ORDER — CETIRIZINE HCL 10 MG PO TABS
10.0000 mg | ORAL_TABLET | Freq: Once | ORAL | Status: AC
  Administered 2023-05-03: 10 mg via ORAL
  Filled 2023-05-03: qty 1

## 2023-05-03 MED ORDER — DARATUMUMAB-HYALURONIDASE-FIHJ 1800-30000 MG-UT/15ML ~~LOC~~ SOLN
1800.0000 mg | Freq: Once | SUBCUTANEOUS | Status: AC
  Administered 2023-05-03: 1800 mg via SUBCUTANEOUS
  Filled 2023-05-03: qty 15

## 2023-05-03 NOTE — Progress Notes (Signed)
Patient presents today for Kathy [name] and Zometa 4mg  IV infusion.  Patient is in satisfactory condition with no new complaints voiced.  Vital signs are stable.  Labs reviewed and all other labs are within treatment parameters. Patient's hemoglobin is noted to be 7.7, magnesium 1.4,  potassium 2.5 and calcium 10.7 MD made aware and stated to give house fluids and no blood will be given due to patient being asympathetic for anemia per Dr.K. We will proceed with treatment per MD orders.    Treatment given today per MD orders. Tolerated infusion without adverse affects. Vital signs stable. No complaints at this time. Discharged from clinic ambulatory in stable condition. Alert and oriented x 3. F/U with [facility] as scheduled.

## 2023-05-03 NOTE — Progress Notes (Unsigned)
CRITICAL VALUE ALERT Critical value received:  K+2.5 Date of notification:  05-03-23 Time of notification: 0905 Critical value read back:  Yes.   Nurse who received alert:  C.Barnaby Rippeon RN MD notified time and response:  0908, Dr. Ellin Saba

## 2023-05-03 NOTE — Patient Instructions (Signed)
[facility]  Discharge Instructions: Thank you for choosing [facility] to provide your oncology and hematology care.  If you have a lab appointment with the Cancer Center - please note that after April 8th, 2024, all labs will be drawn in the cancer center.  You do not have to check in or register with the main entrance as you have in the past but will complete your check-in in the cancer center.  Wear comfortable clothing and clothing appropriate for easy access to any Portacath or PICC line.   We strive to give you quality time with your provider. You may need to reschedule your appointment if you arrive late (15 or more minutes).  Arriving late affects you and other patients whose appointments are after yours.  Also, if you miss three or more appointments without notifying the office, you may be dismissed from the clinic at the provider's discretion.      For prescription refill requests, have your pharmacy contact our office and allow 72 hours for refills to be completed.    Today you received the following chemotherapy and/or immunotherapy agents Dara Ector and house fluids.   To help prevent nausea and vomiting after your treatment, we encourage you to take your nausea medication as directed.  Daratumumab; Hyaluronidase Injection What is this medication? DARATUMUMAB; HYALURONIDASE (dar a toom ue mab; hye al ur ON i dase) treats multiple myeloma, a type of bone marrow cancer. Daratumumab works by blocking a protein that causes cancer cells to grow and multiply. This helps to slow or stop the spread of cancer cells. Hyaluronidase works by increasing the absorption of other medications in the body to help them work better. This medication may also be used treat amyloidosis, a condition that causes the buildup of a protein (amyloid) in your body. It works by reducing the buildup of this protein, which decreases symptoms. It is a combination medication that contains  a monoclonal antibody. This medicine may be used for other purposes; ask your health care provider or pharmacist if you have questions. COMMON BRAND NAME(S): DARZALEX FASPRO What should I tell my care team before I take this medication? They need to know if you have any of these conditions: Heart disease Infection, such as chickenpox, cold sores, herpes, hepatitis B Lung or breathing disease An unusual or allergic reaction to daratumumab, hyaluronidase, other medications, foods, dyes, or preservatives Pregnant or trying to get pregnant Breast-feeding How should I use this medication? This medication is injected under the skin. It is given by your care team in a hospital or clinic setting. Talk to your care team about the use of this medication in children. Special care may be needed. Overdosage: If you think you have taken too much of this medicine contact a poison control center or emergency room at once. NOTE: This medicine is only for you. Do not share this medicine with others. What if I miss a dose? Keep appointments for follow-up doses. It is important not to miss your dose. Call your care team if you are unable to keep an appointment. What may interact with this medication? Interactions have not been studied. This list may not describe all possible interactions. Give your health care provider a list of all the medicines, herbs, non-prescription drugs, or dietary supplements you use. Also tell them if you smoke, drink alcohol, or use illegal drugs. Some items may interact with your medicine. What should I watch for while using this medication? Your condition  will be monitored carefully while you are receiving this medication. This medication can cause serious allergic reactions. To reduce your risk, your care team may give you other medication to take before receiving this one. Be sure to follow the directions from your care team. This medication can affect the results of blood tests to  match your blood type. These changes can last for up to 6 months after the final dose. Your care team will do blood tests to match your blood type before you start treatment. Tell all of your care team that you are being treated with this medication before receiving a blood transfusion. This medication can affect the results of some tests used to determine treatment response; extra tests may be needed to evaluate response. Talk to your care team if you wish to become pregnant or think you are pregnant. This medication can cause serious birth defects if taken during pregnancy and for 3 months after the last dose. A reliable form of contraception is recommended while taking this medication and for 3 months after the last dose. Talk to your care team about effective forms of contraception. Do not breast-feed while taking this medication. What side effects may I notice from receiving this medication? Side effects that you should report to your care team as soon as possible: Allergic reactions--skin rash, itching, hives, swelling of the face, lips, tongue, or throat Heart rhythm changes--fast or irregular heartbeat, dizziness, feeling faint or lightheaded, chest pain, trouble breathing Infection--fever, chills, cough, sore throat, wounds that don't heal, pain or trouble when passing urine, general feeling of discomfort or being unwell Infusion reactions--chest pain, shortness of breath or trouble breathing, feeling faint or lightheaded Sudden eye pain or change in vision such as blurry vision, seeing halos around lights, vision loss Unusual bruising or bleeding Side effects that usually do not require medical attention (report to your care team if they continue or are bothersome): Constipation Diarrhea Fatigue Nausea Pain, tingling, or numbness in the hands or feet Swelling of the ankles, hands, or feet This list may not describe all possible side effects. Call your doctor for medical advice about side  effects. You may report side effects to FDA at 1-800-FDA-1088. Where should I keep my medication? This medication is given in a hospital or clinic. It will not be stored at home. NOTE: This sheet is a summary. It may not cover all possible information. If you have questions about this medicine, talk to your doctor, pharmacist, or health care provider.  2024 Elsevier/Gold Standard (2021-11-24 00:00:00)   BELOW ARE SYMPTOMS THAT SHOULD BE REPORTED IMMEDIATELY: *FEVER GREATER THAN 100.4 F (38 C) OR HIGHER *CHILLS OR SWEATING *NAUSEA AND VOMITING THAT IS NOT CONTROLLED WITH YOUR NAUSEA MEDICATION *UNUSUAL SHORTNESS OF BREATH *UNUSUAL BRUISING OR BLEEDING *URINARY PROBLEMS (pain or burning when urinating, or frequent urination) *BOWEL PROBLEMS (unusual diarrhea, constipation, pain near the anus) TENDERNESS IN MOUTH AND THROAT WITH OR WITHOUT PRESENCE OF ULCERS (sore throat, sores in mouth, or a toothache) UNUSUAL RASH, SWELLING OR PAIN  UNUSUAL VAGINAL DISCHARGE OR ITCHING   Items with * indicate a potential emergency and should be followed up as soon as possible or go to the Emergency Department if any problems should occur.  Please show the CHEMOTHERAPY ALERT CARD or IMMUNOTHERAPY ALERT CARD at check-in to the Emergency Department and triage nurse.  Should you have questions after your visit or need to cancel or reschedule your appointment, please contact [facility] [phone]  and follow  the prompts.  Office hours are 8:00 a.m. to 4:30 p.m. Monday - Friday. Please note that voicemails left after 4:00 p.m. may not be returned until the following business day.  We are closed weekends and major holidays. You have access to a nurse at all times for urgent questions. Please call the main number to the clinic [phone] and follow the prompts.  For any non-urgent questions, you may also contact your provider using MyChart. We now offer e-Visits for anyone 72 and older to  request care online for non-urgent symptoms. For details visit [email or web address].   Also download the MyChart app! Go to the app store, search "MyChart", open the app, select [place], and log in with your MyChart username and password.

## 2023-05-06 ENCOUNTER — Ambulatory Visit: Payer: Self-pay | Admitting: *Deleted

## 2023-05-06 ENCOUNTER — Other Ambulatory Visit (HOSPITAL_COMMUNITY): Payer: Self-pay

## 2023-05-06 MED ORDER — LENALIDOMIDE 10 MG PO CAPS
10.0000 mg | ORAL_CAPSULE | Freq: Every day | ORAL | 0 refills | Status: DC
Start: 2023-05-06 — End: 2023-05-18

## 2023-05-06 NOTE — Patient Outreach (Signed)
Care Coordination   Case closure   Visit Note   05/06/2023 Name: Alvia Wisham Lasorsa MRN: [id number] DOB: 1940/08/25  Chloà Varughese Yun is a 82 y.o. year old female who sees [name], MD for primary care. I  attempted to reach patient via phone, Received update from Post acute care coordinator that patient as of 04/29/23 remains at [facility] in long term care services     What matters to the patients health and wellness today?  Remains at [facility] in long term care - case closure     Goals Addressed             This Visit's Progress    COMPLETED: Home safety management (ramp,bulking flooring)       Interventions Today    Flowsheet Row Most Recent Value  Chronic Disease   Chronic disease during today's visit Other  [Remains at [facility] in long term care - case closure      Post acute care coordinator updated & will re refer patient if needed]              SDOH assessments and interventions completed:  No     Care Coordination Interventions:  Yes, provided   Follow up plan: No further intervention required.   Encounter Outcome:  Patient Visit Completed   Cala Bradford L. Noelle Penner, RN, BSN, [facility]  VBCI Care Management Coordinator  [phone]  Fax: [phone]

## 2023-05-06 NOTE — Patient Instructions (Signed)
Visit Information  Thank you for taking time to visit with me today. Please don't hesitate to contact me if I can be of assistance to you.   Following are the goals we discussed today:   Goals Addressed             This Visit's Progress    COMPLETED: Home safety management (ramp,bulking flooring)       Interventions Today    Flowsheet Row Most Recent Value  Chronic Disease   Chronic disease during today's visit Other  [Remains at [facility] long term care - case closure      Post acute care coordinator updated & will re refer patient if needed]              Our next appointment is  n/a  on n/a at n/a  Please call the care guide team at [phone] if you need to cancel or reschedule your appointment.   If you are experiencing a Mental Health or Behavioral Health Crisis or need someone to talk to, please call the Suicide and Crisis Lifeline: 988 call the [place] National Suicide Prevention Lifeline: 508-825-4165 or TTY: 814 264 3875 TTY (716) 349-9323) to talk to a trained counselor call 1-800-273-TALK (toll free, 24 hour hotline) call the [facility] [place]: [phone] call 911   Remains at [facility] in long term care - case closure    The patient has been provided with contact information for the care management team and has been advised to call with any health related questions or concerns.   Suann Klier L. Noelle Penner, RN, BSN, [facility]  VBC[employer] Care Management Coordinator  [phone]  Fax: [phone]

## 2023-05-06 NOTE — Addendum Note (Signed)
Addended by: Remi Haggard on: 05/06/2023 02:29 PM   Modules accepted: Orders

## 2023-05-09 ENCOUNTER — Other Ambulatory Visit: Payer: Self-pay

## 2023-05-09 DIAGNOSIS — C9000 Multiple myeloma not having achieved remission: Secondary | ICD-10-CM

## 2023-05-10 ENCOUNTER — Inpatient Hospital Stay: Payer: 59

## 2023-05-10 ENCOUNTER — Encounter: Payer: Self-pay | Admitting: Hematology

## 2023-05-10 ENCOUNTER — Inpatient Hospital Stay (HOSPITAL_BASED_OUTPATIENT_CLINIC_OR_DEPARTMENT_OTHER): Payer: 59 | Admitting: Hematology

## 2023-05-10 VITALS — BP 114/53 | HR 87 | Temp 96.2°F | Resp 18 | Wt 141.0 lb

## 2023-05-10 DIAGNOSIS — Z5112 Encounter for antineoplastic immunotherapy: Secondary | ICD-10-CM | POA: Diagnosis not present

## 2023-05-10 DIAGNOSIS — C9000 Multiple myeloma not having achieved remission: Secondary | ICD-10-CM

## 2023-05-10 DIAGNOSIS — Z79899 Other long term (current) drug therapy: Secondary | ICD-10-CM | POA: Diagnosis not present

## 2023-05-10 LAB — COMPREHENSIVE METABOLIC PANEL
ALT: 13 U/L (ref 0–44)
AST: 19 U/L (ref 15–41)
Albumin: <1.5 g/dL — ABNORMAL LOW (ref 3.5–5.0)
Alkaline Phosphatase: 134 U/L — ABNORMAL HIGH (ref 38–126)
Anion gap: 12 (ref 5–15)
BUN: 40 mg/dL — ABNORMAL HIGH (ref 8–23)
CO2: 25 mmol/L (ref 22–32)
Calcium: 9.2 mg/dL (ref 8.9–10.3)
Chloride: 93 mmol/L — ABNORMAL LOW (ref 98–111)
Creatinine, Ser: 3.27 mg/dL — ABNORMAL HIGH (ref 0.44–1.00)
GFR, Estimated: 14 mL/min — ABNORMAL LOW (ref >60)
Glucose, Bld: 97 mg/dL (ref 70–99)
Potassium: 2.5 mmol/L — CL (ref 3.5–5.1)
Sodium: 130 mmol/L — ABNORMAL LOW (ref 135–145)
Total Bilirubin: 0.8 mg/dL (ref 0.3–1.2)
Total Protein: 8.9 g/dL — ABNORMAL HIGH (ref 6.5–8.1)

## 2023-05-10 LAB — CBC WITH DIFFERENTIAL/PLATELET
Abs Immature Granulocytes: 0.06 10*3/uL (ref 0.00–0.07)
Basophils Absolute: 0 10*3/uL (ref 0.0–0.1)
Basophils Relative: 0 %
Eosinophils Absolute: 0 10*3/uL (ref 0.0–0.5)
Eosinophils Relative: 0 %
HCT: 25.1 % — ABNORMAL LOW (ref 36.0–46.0)
Hemoglobin: 8.7 g/dL — ABNORMAL LOW (ref 12.0–15.0)
Immature Granulocytes: 1 %
Lymphocytes Relative: 9 %
Lymphs Abs: 0.6 10*3/uL — ABNORMAL LOW (ref 0.7–4.0)
MCH: 34 pg (ref 26.0–34.0)
MCHC: 34.7 g/dL (ref 30.0–36.0)
MCV: 98 fL (ref 80.0–100.0)
Monocytes Absolute: 0.3 10*3/uL (ref 0.1–1.0)
Monocytes Relative: 4 %
Neutro Abs: 5.3 10*3/uL (ref 1.7–7.7)
Neutrophils Relative %: 86 %
Platelets: 111 10*3/uL — ABNORMAL LOW (ref 150–400)
RBC: 2.56 MIL/uL — ABNORMAL LOW (ref 3.87–5.11)
RDW: 18.2 % — ABNORMAL HIGH (ref 11.5–15.5)
WBC: 6.3 10*3/uL (ref 4.0–10.5)
nRBC: 0.3 % — ABNORMAL HIGH (ref 0.0–0.2)

## 2023-05-10 LAB — MAGNESIUM: Magnesium: 1.6 mg/dL — ABNORMAL LOW (ref 1.7–2.4)

## 2023-05-10 MED ORDER — MAGNESIUM SULFATE 2 GM/50ML IV SOLN
2.0000 g | Freq: Once | INTRAVENOUS | Status: AC
  Administered 2023-05-10: 2 g via INTRAVENOUS
  Filled 2023-05-10: qty 50

## 2023-05-10 MED ORDER — DARATUMUMAB-HYALURONIDASE-FIHJ 1800-30000 MG-UT/15ML ~~LOC~~ SOLN
1800.0000 mg | Freq: Once | SUBCUTANEOUS | Status: AC
  Administered 2023-05-10: 1800 mg via SUBCUTANEOUS
  Filled 2023-05-10: qty 15

## 2023-05-10 MED ORDER — ACETAMINOPHEN 325 MG PO TABS
650.0000 mg | ORAL_TABLET | Freq: Once | ORAL | Status: AC
  Administered 2023-05-10: 650 mg via ORAL
  Filled 2023-05-10: qty 2

## 2023-05-10 MED ORDER — POTASSIUM CHLORIDE IN NACL 20-0.9 MEQ/L-% IV SOLN
INTRAVENOUS | Status: AC
  Filled 2023-05-10: qty 1000

## 2023-05-10 MED ORDER — CETIRIZINE HCL 10 MG PO TABS
10.0000 mg | ORAL_TABLET | Freq: Once | ORAL | Status: AC
  Administered 2023-05-10: 10 mg via ORAL
  Filled 2023-05-10: qty 1

## 2023-05-10 MED ORDER — POTASSIUM CHLORIDE IN NACL 20-0.9 MEQ/L-% IV SOLN: INTRAVENOUS | Status: DC

## 2023-05-10 MED ORDER — MONTELUKAST SODIUM 10 MG PO TABS
10.0000 mg | ORAL_TABLET | Freq: Once | ORAL | Status: AC
  Administered 2023-05-10: 10 mg via ORAL
  Filled 2023-05-10: qty 1

## 2023-05-10 MED ORDER — DEXAMETHASONE 4 MG PO TABS
20.0000 mg | ORAL_TABLET | Freq: Once | ORAL | Status: AC
  Administered 2023-05-10: 20 mg via ORAL
  Filled 2023-05-10: qty 5

## 2023-05-10 NOTE — Progress Notes (Signed)
Patient presents today for Daratumumab injection per providers order.  Vital signs and labs within parameters for injection.  Patient will also receive potassium and magnesium per providers order.  Stable during administration without incident; injection site WNL; see MAR for injection details. Tolerated infusion without adverse affects.  Vital signs stable.  No complaints at this time.  Discharge from clinic ambulatory in stable condition.  Alert and oriented X 3.  Follow up with [facility] as scheduled.

## 2023-05-10 NOTE — Progress Notes (Signed)
CRITICAL VALUE ALERT Critical value received:  potassium 2.5 Date of notification:  05-10-2023 Time of notification: 12:36 pm  Critical value read back:  Yes.   Nurse who received alert:  B.Zaydah Nawabi Rn.  MD notified time and response:  Dr. Ellin Saba @ 12:37 pm. Patient has an appointment with Dr. Theo Dills today.

## 2023-05-10 NOTE — Patient Instructions (Signed)

## 2023-05-10 NOTE — Patient Instructions (Signed)
[facility]  Discharge Instructions: Thank you for choosing [facility] to provide your oncology and hematology care.  If you have a lab appointment with the Cancer Center - please note that after April 8th, 2024, all labs will be drawn in the cancer center.  You do not have to check in or register with the main entrance as you have in the past but will complete your check-in in the cancer center.  Wear comfortable clothing and clothing appropriate for easy access to any Portacath or PICC line.   We strive to give you quality time with your provider. You may need to reschedule your appointment if you arrive late (15 or more minutes).  Arriving late affects you and other patients whose appointments are after yours.  Also, if you miss three or more appointments without notifying the office, you may be dismissed from the clinic at the provider's discretion.      For prescription refill requests, have your pharmacy contact our office and allow 72 hours for refills to be completed.    Today you received the following chemotherapy and/or immunotherapy agents Daratumumab      To help prevent nausea and vomiting after your treatment, we encourage you to take your nausea medication as directed.  BELOW ARE SYMPTOMS THAT SHOULD BE REPORTED IMMEDIATELY: *FEVER GREATER THAN 100.4 F (38 C) OR HIGHER *CHILLS OR SWEATING *NAUSEA AND VOMITING THAT IS NOT CONTROLLED WITH YOUR NAUSEA MEDICATION *UNUSUAL SHORTNESS OF BREATH *UNUSUAL BRUISING OR BLEEDING *URINARY PROBLEMS (pain or burning when urinating, or frequent urination) *BOWEL PROBLEMS (unusual diarrhea, constipation, pain near the anus) TENDERNESS IN MOUTH AND THROAT WITH OR WITHOUT PRESENCE OF ULCERS (sore throat, sores in mouth, or a toothache) UNUSUAL RASH, SWELLING OR PAIN  UNUSUAL VAGINAL DISCHARGE OR ITCHING   Items with * indicate a potential emergency and should be followed up as soon as possible or go to the  Emergency Department if any problems should occur.  Please show the CHEMOTHERAPY ALERT CARD or IMMUNOTHERAPY ALERT CARD at check-in to the Emergency Department and triage nurse.  Should you have questions after your visit or need to cancel or reschedule your appointment, please contact [facility] AT  [phone]  and follow the prompts.  Office hours are 8:00 a.m. to 4:30 p.m. Monday - Friday. Please note that voicemails left after 4:00 p.m. may not be returned until the following business day.  We are closed weekends and major holidays. You have access to a nurse at all times for urgent questions. Please call the main number to the clinic [phone] and follow the prompts.  For any non-urgent questions, you may also contact your provider using MyChart. We now offer e-Visits for anyone 18 and older to request care online for non-urgent symptoms. For details visit [email or web address].   Also download the MyChart app! Go to the app store, search "MyChart", open the app, select , and log in with your MyChart username and password.   

## 2023-05-10 NOTE — Progress Notes (Signed)
[facility] [address], [place] [address]   Clinic Day:  05/10/23   Referring physician: [name], MD  Patient Care Team: [name], MD as PCP - General (Internal Medicine) Wyline Mood Dorothe Pea, MD as PCP - Cardiology (Cardiology) Dorisann Frames, MD as Referring Physician (Endocrinology) Doreatha Massed, MD as Medical Oncologist (Medical Oncology) Therese Sarah, RN as Oncology Nurse Navigator (Medical Oncology)   ASSESSMENT & PLAN:   Assessment:  1.  IgG kappa multiple myeloma: - Presentation with malignant hypercalcemia since 01/23/2023 - 04/08/2023: M spike-5.1 g, kappa light chains 872, lambda light chains 4.1, ratio 212.68.  Beta-2 microglobulin 3.6.  LDH 200. - BMBX (04/08/2023): Lambda restricted plasma cells involving approximately 50% of the cellular marrow. - Cytogenetics: 46, XX[20]  2.  Social/family history: - Prior to recent hospitalization, she was living by herself at home.  Her 2 brothers live in [place].  She is a retired Engineer, site.  Non-smoker.  1 brother had thyroid cancer.  Another brother had prostate cancer.  Father had lung cancer.  Plan:  1.  IgG kappa multiple myeloma: - She continues to be at rehab facility in [place]. - She has not started Revlimid yet as there was difficulty with delivery signing for it. - She has tolerated 2 doses of Darzalex and dexamethasone very well. - I reviewed labs today: Creatinine elevated at 3.27.  Potassium is severely low at 2.5.  She will receive IV hydration with potassium and magnesium. - She will proceed with Darzalex and dexamethasone today.  We will repeat her labs again next week and give fluids if needed. - I will see her back on cycle 2-day 8 with multiple myeloma labs done on cycle 2-day 1.  2.  ID prophylaxis: - Continue acyclovir for shingles prophylaxis and aspirin for thromboprophylaxis.  3.  Hypoalbuminemia: - Likely from combination of myeloma and  malnutrition. - Continue drinking boost high-protein 3 times daily.  Recommend eating high-protein foods.  4.  Hypotension: - She is on midodrine 2.5 mg 3 times daily.  Blood pressure today is 91/50.   Orders Placed This Encounter  Procedures   Kappa/lambda light chains    Standing Status:   Future    Standing Expiration Date:   05/09/2024   Protein electrophoresis, serum    Standing Status:   Future    Standing Expiration Date:   05/09/2024      Mikeal Hawthorne R Teague,acting as a scribe for Doreatha Massed, MD.,have documented all relevant documentation on the behalf of Doreatha Massed, MD,as directed by  Doreatha Massed, MD while in the presence of Doreatha Massed, MD.  I, Doreatha Massed MD, have reviewed the above documentation for accuracy and completeness, and I agree with the above.    Doreatha Massed, MD   10/8/20244:51 PM  CHIEF COMPLAINT/PURPOSE OF CONSULT:   Diagnosis: Multiple myeloma   Cancer Staging  Multiple myeloma (HCC) Staging form: Plasma Cell Myeloma and Plasma Cell Disorders, AJCC 8th Edition - Clinical stage from 04/14/2023: Beta-2-microglobulin (mg/L): 3.6, Albumin (g/dL): 1.5, ISS: Stage II, LDH: Elevated - Unsigned    Prior Therapy: none  Current Therapy: Daratumumab, lenalidomide and dexamethasone   HISTORY OF PRESENT ILLNESS:   Oncology History  Multiple myeloma (HCC)  04/14/2023 Initial Diagnosis   Multiple myeloma (HCC)   04/26/2023 -  Chemotherapy   Patient is on Treatment Plan : MYELOMA Newly Diagnosed Daratumumab SQ + Lenalidomide + Dexamethasone (DaraRd) q28d  Demia is a 82 y.o. female presenting to clinic today for evaluation of Multiple myeloma at the request of [name], MD.  Patient was recently in the ED on 04/06/23 for multiple falls and hypercalcemia where she was seen by me. Labs were consistent with MM and bone marrow biopsy and PET were ordered. BMBX on 04/08/23 revealed: lambda restricted  plasma cell neoplasm involving approximately 50% of the cellular marrow consistent with plasma cell myeloma the appropriate clinical/serologic setting. The peripheral blood found macrocytic anemia.   Today, she states that she is doing well overall. Her appetite level is at 50%. Her energy level is at 50%.  She reports decreased appetite, but attempts to eat. She drinks 3 Boosts a day. She is currently residing at a rehab facility called [facility] in [place], [place]. She has physical therapy there and is walking some. She has not been given a definite date to be discharged from rehab facility. She believes she is getting stronger. She denies any PE's or DVT's. She does not have any family members that live close by, but she does a a close friend that lives close and checks on her daily. Her sister-in-law will be staying with her when she starts living at home.  INTERVAL HISTORY:   Kenadi Miltner Mangrum is a 82 y.o. female presenting to the clinic today for follow-up of MM. She was last seen by me on 04/19/23 in consultation.  Today, she states that she is doing well overall. Her appetite level is at 40%. Her energy level is at 70%.   She notes a decreased appetite. She tolerates treatment well and denies any nausea, vomiting, diarrhea, or constipation. She reports a mild cough. She currently resides in the nursing facility in [place], [place]. She does not do physical therapy at the nursing facility daily. She has not taken Revlimid as she has not able to have it shipped to her house. She states her brother will be at her house this week (resides in [place]).  PAST MEDICAL HISTORY:   Past Medical History: Past Medical History:  Diagnosis Date   Abnormal findings on diagnostic imaging of heart and coronary circulation    Anemia    FROM BLEEDING ULCER   Anxiety    takes Alprazolam daily as needed   Arthritis    dx with RA 2017   Bariatric surgery status    Cataract    Diverticulosis     Gastro-esophageal reflux disease with esophagitis    Gastrojejunal ulcer with hemorrhage    Headache(784.0)    occasionally   History of blood transfusion    no abnormal reaction noted   History of bronchitis    last time many yrs ago   Hyperlipidemia    PT DENIES THIS DX -  ON NO MEDS AND NO ONE HAS TOLD HER   Hypertension    takes Amlodipine daily   Hypothyroidism    takes Synthroid daily   Insomnia    Joint pain    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   Localized edema    Nocturia    Numbness    occasionally left arm at night   Obesity    Osteoporosis    takes Fosamax weekly   Pain in joint involving pelvic region and thigh    Peripheral edema    takes Lasix daily as needed   PONV (postoperative nausea and vomiting)    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/2017   Rheumatoid arthritis, unspecified (HCC)  Rotator cuff arthropathy, 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   Sleep apnea    Phreesia 08/22/2020   Stomach ulcer    Thyroid disease    Phreesia 08/22/2020   Unspecified injury of muscle(s) and tendon(s) of the rotator cuff of left shoulder, subsequent encounter    Unspecified osteoarthritis, unspecified site    Wears glasses    Wears partial dentures     Surgical History: Past Surgical History:  Procedure Laterality Date   ABDOMINAL HYSTERECTOMY     partial   cataract surgery Bilateral    CHOLECYSTECTOMY     COLONOSCOPY     ESOPHAGOGASTRODUODENOSCOPY (EGD) WITH PROPOFOL N/A 01/11/2022   Procedure: ESOPHAGOGASTRODUODENOSCOPY (EGD) WITH PROPOFOL;  Surgeon: Iva Boop, MD;  Location[facility] ENDOSCOPY;  Service: Gastroenterology;  Laterality: N/A;   EYE SURGERY     CATARACTS BOTH   gastric bypass surgery     HOT HEMOSTASIS N/A 01/11/2022   Procedure: HOT HEMOSTASIS (ARGON PLASMA COAGULATION/BICAP);  Surgeon: Iva Boop, MD;  Location: [facility] ENDOSCOPY;  Service: Gastroenterology;  Laterality: N/A;   JOINT REPLACEMENT Bilateral    hip   REVERSE  SHOULDER ARTHROPLASTY Right 05/11/2016   Procedure: REVERSE SHOULDER ARTHROPLASTY;  Surgeon: Teryl Lucy, MD;  Location: [facility] OR;  Service: Orthopedics;  Laterality: Right;   REVISION TOTAL HIP ARTHROPLASTY Left 10/03/2013   DR Turner Daniels   ROTATOR CUFF REPAIR     SCLEROTHERAPY  01/11/2022   Procedure: Susa Day;  Surgeon: Iva Boop, MD;  Location: [facility] ENDOSCOPY;  Service: Gastroenterology;;   TOTAL HIP REVISION Left 10/03/2013   Procedure: TOTAL HIP REVISION- left;  Surgeon: Nestor Lewandowsky, MD;  Location: [facility] OR;  Service: Orthopedics;  Laterality: Left;   TOTAL KNEE ARTHROPLASTY Left 11/29/2017   Procedure: LEFT TOTAL KNEE ARTHROPLASTY;  Surgeon: Teryl Lucy, MD;  Location: [facility] OR;  Service: Orthopedics;  Laterality: Left;   TOTAL SHOULDER ARTHROPLASTY Left 11/09/2016   Procedure: TOTAL REVERSE SHOULDER ARTHROPLASTY;  Surgeon: Teryl Lucy, MD;  Location: [facility] OR;  Service: Orthopedics;  Laterality: Left;   TOTAL SHOULDER REPLACEMENT Left 10/2016   TOTAL THYROIDECTOMY     UPPER GASTROINTESTINAL ENDOSCOPY      Social History: Social History   Socioeconomic History   Marital status: Divorced    Spouse name: Not on file   Number of children: Not on file   Years of education: Not on file   Highest education level: Not on file  Occupational History   Occupation: disabled  Tobacco Use   Smoking status: Never    Passive exposure: Never   Smokeless tobacco: Never  Vaping Use   Vaping status: Never Used  Substance and Sexual Activity   Alcohol use: No   Drug use: No   Sexual activity: Not Currently    Birth control/protection: Surgical  Other Topics Concern   Not on file  Social History Narrative   Not on file   Social Determinants of Health   Financial Resource Strain: Low Risk  (09/14/2021)   Overall Financial Resource Strain (CARDIA)    Difficulty of Paying Living Expenses: Not hard at all  Food Insecurity: No Food Insecurity (04/07/2023)   Hunger Vital Sign    Worried  About Running Out of Food in the Last Year: Never true    Ran Out of Food in the Last Year: Never true  Transportation Needs: No Transportation Needs (04/07/2023)   PRAPARE - Administrator, Civil Service (Medical): No    Lack of Transportation (Non-Medical): No  Physical Activity:  Sufficiently Active (09/14/2021)   Exercise Vital Sign    Days of Exercise per Week: 6 days    Minutes of Exercise per Session: 40 min  Stress: No Stress Concern Present (09/14/2021)   [employer] of Occupational Health - Occupational Stress Questionnaire    Feeling of Stress : Not at all  Social Connections: Moderately Isolated (09/14/2021)   Social Connection and Isolation Panel [NHANES]    Frequency of Communication with Friends and Family: More than three times a week    Frequency of Social Gatherings with Friends and Family: More than three times a week    Attends Religious Services: More than 4 times per year    Active Member of [employer] or Organizations: No    Attends Banker Meetings: Never    Marital Status: Divorced  Catering manager Violence: Not At Risk (04/07/2023)   Humiliation, Afraid, Rape, and Kick questionnaire    Fear of Current or Ex-Partner: No    Emotionally Abused: No    Physically Abused: No    Sexually Abused: No    Family History: Family History  Problem Relation Age of Onset   Diabetes Mother    Congestive Heart Failure Mother    Lung cancer Father    Thrombosis Sister    CAD Brother        CABG   Colon cancer Neg Hx    Esophageal cancer Neg Hx    Stomach cancer Neg Hx    Rectal cancer Neg Hx     Current Medications:  Current Outpatient Medications:    acetaminophen (TYLENOL) 500 MG tablet, Take 1 tablet (500 mg total) by mouth every 6 (six) hours as needed for mild pain, headache or fever (or Fever >/= 101)., Disp: 30 tablet, Rfl: 0   acyclovir (ZOVIRAX) 400 MG tablet, Take 1 tablet (400 mg total) by mouth 2 (two) times daily., Disp: 60 tablet,  Rfl: 11   Adalimumab (HUMIRA PEN) 40 MG/0.4ML PNKT, Inject 40 mg into the skin every Saturday., Disp: , Rfl:    denosumab (PROLIA) 60 MG/ML SOSY injection, inject 60mg  Subcutaneous every 6 months 180 days, Disp: 1 mL, Rfl: 1   denosumab (PROLIA) 60 MG/ML SOSY injection, inject 60mg  Subcutaneous every 6 months 180 days, Disp: 1 mL, Rfl: 1   denosumab (PROLIA) 60 MG/ML SOSY injection, 60mg  Subcutaneous every 6 months 180 days, Disp: 1 mL, Rfl: 1   denosumab (PROLIA) 60 MG/ML SOSY injection, 60mg  Subcutaneous every 6 months 180 days, Disp: 1 mL, Rfl: 1   denosumab (PROLIA) 60 MG/ML SOSY injection, 60mg  Subcutaneous every 6 months 180 days, Disp: 1 mL, Rfl: 1   dexamethasone (DECADRON) 4 MG tablet, Take 5 tablets (20 mg total) by mouth once a week. Take the day after darzalex faspro. Take with breakfast., Disp: 20 tablet, Rfl: 11   diclofenac Sodium (VOLTAREN) 1 % GEL, APPLY (1) GRAM TO AFFECTED AREA DAILY. (Patient taking differently: Apply 1 g topically daily.), Disp: 100 g, Rfl: 0   ferrous sulfate 325 (65 FE) MG EC tablet, Take 1 tablet (325 mg total) by mouth daily., Disp: 60 tablet, Rfl: 3   furosemide (LASIX) 20 MG tablet, Take 1 tablet (20 mg total) by mouth 2 (two) times daily. (Patient taking differently: Take 20 mg by mouth daily as needed for fluid.), Disp: 180 tablet, Rfl: 3   lenalidomide (REVLIMID) 10 MG capsule, Take 1 capsule (10 mg total) by mouth daily. Take for 21 days on, 7 days off, Disp: 21 capsule,  Rfl: 0   levothyroxine (SYNTHROID) 50 MCG tablet, Take 1 tablet (50 mcg total) by mouth daily., Disp: 30 tablet, Rfl: 1   midodrine (PROAMATINE) 2.5 MG tablet, Take 3 tablets (7.5 mg total) by mouth 3 (three) times daily with meals., Disp: 270 tablet, Rfl: 0   Multiple Vitamin (MULTIVITAMIN WITH MINERALS) TABS tablet, Take 1 tablet by mouth every morning., Disp: , Rfl:    naloxone (NARCAN) nasal spray 4 mg/0.1 mL, Place 1 spray into the nose as needed (opioid reversal)., Disp: , Rfl:     Omega-3 Fatty Acids (FISH OIL) 1000 MG CAPS, Take by mouth., Disp: , Rfl:    omeprazole (PRILOSEC) 40 MG capsule, Take 1 capsule (40 mg total) by mouth daily before breakfast. Open capsule and swallow granules with liquid or applesauce, Disp: 90 capsule, Rfl: 3   oxybutynin (DITROPAN-XL) 10 MG 24 hr tablet, Take 10 mg by mouth at bedtime., Disp: , Rfl:    polyethylene glycol (MIRALAX / GLYCOLAX) 17 g packet, Take 17 g by mouth daily., Disp: 14 each, Rfl: 0   potassium chloride SA (KLOR-CON M) 20 MEQ tablet, Take 1 tablet (20 mEq total) by mouth daily. TAKE 1 TABLET (20 MEQ) BY MOUTH DAILY PRN with Lasix (EDEMA), Disp: 90 tablet, Rfl: 3   prochlorperazine (COMPAZINE) 10 MG tablet, Take 1 tablet (10 mg total) by mouth every 6 (six) hours as needed for nausea or vomiting., Disp: 30 tablet, Rfl: 1   traMADol (ULTRAM) 50 MG tablet, Take 50 mg by mouth every 8 (eight) hours as needed., Disp: , Rfl:    Allergies: Allergies  Allergen Reactions   Macrobid [Nitrofurantoin] Other (See Comments)    Stomach pain    REVIEW OF SYSTEMS:   Review of Systems  Constitutional:  Negative for chills, fatigue and fever.  HENT:   Negative for lump/mass, mouth sores, nosebleeds, sore throat and trouble swallowing.   Eyes:  Negative for eye problems.  Respiratory:  Negative for cough and shortness of breath.   Cardiovascular:  Negative for chest pain, leg swelling and palpitations.  Gastrointestinal:  Negative for abdominal pain, constipation, diarrhea, nausea and vomiting.  Genitourinary:  Negative for bladder incontinence, difficulty urinating, dysuria, frequency, hematuria and nocturia.   Musculoskeletal:  Negative for arthralgias, back pain, flank pain, myalgias and neck pain.  Skin:  Negative for itching and rash.  Neurological:  Negative for dizziness, headaches and numbness.  Hematological:  Does not bruise/bleed easily.  Psychiatric/Behavioral:  Negative for depression, sleep disturbance and suicidal  ideas. The patient is not nervous/anxious.   All other systems reviewed and are negative.    VITALS:   Blood pressure (!) 91/50, pulse 83, temperature 98.4 F (36.9 C), temperature source Oral, resp. rate 18, SpO2 97%.  Wt Readings from Last 3 Encounters:  05/10/23 141 lb (64 kg)  04/10/23 154 lb 15.7 oz (70.3 kg)  03/30/23 151 lb 6.4 oz (68.7 kg)    There is no height or weight on file to calculate BMI.  Performance status (ECOG): 2 - Symptomatic, <50% confined to bed  PHYSICAL EXAM:   Physical Exam Vitals and nursing note reviewed. Exam conducted with a chaperone present.  Constitutional:      Appearance: Normal appearance.  Cardiovascular:     Rate and Rhythm: Normal rate and regular rhythm.     Pulses: Normal pulses.     Heart sounds: Normal heart sounds.  Pulmonary:     Effort: Pulmonary effort is normal.     Breath sounds:  Normal breath sounds.  Abdominal:     Palpations: Abdomen is soft. There is no hepatomegaly, splenomegaly or mass.     Tenderness: There is no abdominal tenderness.  Musculoskeletal:     Right lower leg: No edema.     Left lower leg: No edema.  Lymphadenopathy:     Cervical: No cervical adenopathy.     Right cervical: No superficial, deep or posterior cervical adenopathy.    Left cervical: No superficial, deep or posterior cervical adenopathy.     Upper Body:     Right upper body: No supraclavicular or axillary adenopathy.     Left upper body: No supraclavicular or axillary adenopathy.  Neurological:     General: No focal deficit present.     Mental Status: She is alert and oriented to person, place, and time.  Psychiatric:        Mood and Affect: Mood normal.        Behavior: Behavior normal.     LABS:      Latest Ref Rng & Units 05/10/2023   11:17 AM 05/03/2023    8:31 AM 04/26/2023    8:13 AM  CBC  WBC 4.0 - 10.5 K/uL 6.3  3.1  4.9   Hemoglobin 12.0 - 15.0 g/dL 8.7  7.7  9.0   Hematocrit 36.0 - 46.0 % 25.1  23.5  26.9   Platelets  150 - 400 K/uL 111  127  152       Latest Ref Rng & Units 05/10/2023   11:17 AM 05/03/2023    8:31 AM 04/26/2023    8:13 AM  CMP  Glucose 70 - 99 mg/dL 97  78  77   BUN 8 - 23 mg/dL 40  12  18   Creatinine 0.44 - 1.00 mg/dL 6.04  5.40  9.81   Sodium 135 - 145 mmol/L 130  133  137   Potassium 3.5 - 5.1 mmol/L 2.5  2.5  2.9   Chloride 98 - 111 mmol/L 93  97  99   CO2 22 - 32 mmol/L 25  28  28    Calcium 8.9 - 10.3 mg/dL 9.2  19.1  47.8   Total Protein 6.5 - 8.1 g/dL 8.9  8.6  9.6   Total Bilirubin 0.3 - 1.2 mg/dL 0.8  0.4  0.4   Alkaline Phos 38 - 126 U/L 134  94  97   AST 15 - 41 U/L 19  21  31    ALT 0 - 44 U/L 13  13  17       No results found for: "CEA1", "CEA" / No results found for: "CEA1", "CEA" No results found for: "PSA1" No results found for: "GNF621" No results found for: "CAN125"  Lab Results  Component Value Date   TOTALPROTELP 9.3 (H) 04/07/2023   TOTALPROTELP 8.7 (H) 04/07/2023   ALBUMINELP 2.2 (L) 04/07/2023   A1GS 0.3 04/07/2023   A2GS 0.6 04/07/2023   BETS 5.5 (H) 04/07/2023   GAMS 0.2 (L) 04/07/2023   MSPIKE 5.1 (H) 04/07/2023   SPEI Comment 04/07/2023   Lab Results  Component Value Date   TIBC 184 (L) 01/26/2023   TIBC 305 01/10/2022   FERRITIN 101 01/26/2023   FERRITIN 18 01/10/2022   IRONPCTSAT 36 (H) 01/26/2023   IRONPCTSAT 21 01/10/2022   Lab Results  Component Value Date   LDH 200 (H) 04/08/2023     STUDIES:   No results found.

## 2023-05-10 NOTE — Telephone Encounter (Signed)
Received notification from [facility] that they could not fill script for patient.   Script has now been sent to [facility] for processing and fulfillment. [facility] confirmed that script was received and sent this notification today, 05/10/23:  "We had reached out to Ms. Kathy [name], 1941/05/06, and was informed she is living in a long-term care facility.  We will need to reach out to the facility to obtain living status and how they are billing her Medicare.  Once I have any updates, I will let you know."  I will continue to follow and update until final word on how patient is going to receive medication while staying at [facility].   Ardeen Fillers, CPhT Oncology Pharmacy Patient Advocate  [facility]  [phone] (phone) [phone] (fax) 05/10/2023 4:11 PM

## 2023-05-11 ENCOUNTER — Emergency Department: Payer: 59

## 2023-05-11 ENCOUNTER — Other Ambulatory Visit: Payer: Self-pay

## 2023-05-11 ENCOUNTER — Inpatient Hospital Stay
Admission: EM | Admit: 2023-05-11 | Discharge: 2023-05-18 | DRG: 871 | Disposition: A | Discharge status: Other Acute Inpatient Hospital | Payer: 59 | Attending: Internal Medicine | Admitting: Internal Medicine

## 2023-05-11 DIAGNOSIS — R6889 Other general symptoms and signs: Secondary | ICD-10-CM | POA: Diagnosis not present

## 2023-05-11 DIAGNOSIS — E870 Hyperosmolality and hypernatremia: Secondary | ICD-10-CM | POA: Diagnosis not present

## 2023-05-11 DIAGNOSIS — M069 Rheumatoid arthritis, unspecified: Secondary | ICD-10-CM | POA: Diagnosis not present

## 2023-05-11 DIAGNOSIS — R6520 Severe sepsis without septic shock: Secondary | ICD-10-CM | POA: Diagnosis present

## 2023-05-11 DIAGNOSIS — E878 Other disorders of electrolyte and fluid balance, not elsewhere classified: Secondary | ICD-10-CM | POA: Diagnosis present

## 2023-05-11 DIAGNOSIS — N390 Urinary tract infection, site not specified: Secondary | ICD-10-CM | POA: Diagnosis not present

## 2023-05-11 DIAGNOSIS — G4733 Obstructive sleep apnea (adult) (pediatric): Secondary | ICD-10-CM | POA: Diagnosis present

## 2023-05-11 DIAGNOSIS — Z96652 Presence of left artificial knee joint: Secondary | ICD-10-CM | POA: Diagnosis present

## 2023-05-11 DIAGNOSIS — R400 Somnolence: Secondary | ICD-10-CM | POA: Diagnosis not present

## 2023-05-11 DIAGNOSIS — R531 Weakness: Secondary | ICD-10-CM | POA: Diagnosis not present

## 2023-05-11 DIAGNOSIS — Z96611 Presence of right artificial shoulder joint: Secondary | ICD-10-CM | POA: Diagnosis present

## 2023-05-11 DIAGNOSIS — E43 Unspecified severe protein-calorie malnutrition: Secondary | ICD-10-CM | POA: Diagnosis present

## 2023-05-11 DIAGNOSIS — R404 Transient alteration of awareness: Secondary | ICD-10-CM | POA: Diagnosis not present

## 2023-05-11 DIAGNOSIS — Z9049 Acquired absence of other specified parts of digestive tract: Secondary | ICD-10-CM

## 2023-05-11 DIAGNOSIS — E039 Hypothyroidism, unspecified: Secondary | ICD-10-CM | POA: Diagnosis present

## 2023-05-11 DIAGNOSIS — K219 Gastro-esophageal reflux disease without esophagitis: Secondary | ICD-10-CM | POA: Diagnosis present

## 2023-05-11 DIAGNOSIS — E785 Hyperlipidemia, unspecified: Secondary | ICD-10-CM | POA: Diagnosis not present

## 2023-05-11 DIAGNOSIS — N3090 Cystitis, unspecified without hematuria: Secondary | ICD-10-CM | POA: Diagnosis not present

## 2023-05-11 DIAGNOSIS — D849 Immunodeficiency, unspecified: Secondary | ICD-10-CM | POA: Diagnosis present

## 2023-05-11 DIAGNOSIS — R0689 Other abnormalities of breathing: Secondary | ICD-10-CM | POA: Diagnosis not present

## 2023-05-11 DIAGNOSIS — A4151 Sepsis due to Escherichia coli [E. coli]: Principal | ICD-10-CM | POA: Diagnosis present

## 2023-05-11 DIAGNOSIS — Z801 Family history of malignant neoplasm of trachea, bronchus and lung: Secondary | ICD-10-CM

## 2023-05-11 DIAGNOSIS — I9589 Other hypotension: Secondary | ICD-10-CM | POA: Diagnosis present

## 2023-05-11 DIAGNOSIS — Z79899 Other long term (current) drug therapy: Secondary | ICD-10-CM

## 2023-05-11 DIAGNOSIS — Z7982 Long term (current) use of aspirin: Secondary | ICD-10-CM

## 2023-05-11 DIAGNOSIS — G9341 Metabolic encephalopathy: Secondary | ICD-10-CM | POA: Diagnosis not present

## 2023-05-11 DIAGNOSIS — I10 Essential (primary) hypertension: Secondary | ICD-10-CM | POA: Diagnosis present

## 2023-05-11 DIAGNOSIS — I129 Hypertensive chronic kidney disease with stage 1 through stage 4 chronic kidney disease, or unspecified chronic kidney disease: Secondary | ICD-10-CM | POA: Diagnosis not present

## 2023-05-11 DIAGNOSIS — N3000 Acute cystitis without hematuria: Secondary | ICD-10-CM | POA: Diagnosis present

## 2023-05-11 DIAGNOSIS — I6782 Cerebral ischemia: Secondary | ICD-10-CM | POA: Diagnosis not present

## 2023-05-11 DIAGNOSIS — E8721 Acute metabolic acidosis: Secondary | ICD-10-CM | POA: Diagnosis not present

## 2023-05-11 DIAGNOSIS — K5730 Diverticulosis of large intestine without perforation or abscess without bleeding: Secondary | ICD-10-CM | POA: Diagnosis not present

## 2023-05-11 DIAGNOSIS — Z8711 Personal history of peptic ulcer disease: Secondary | ICD-10-CM

## 2023-05-11 DIAGNOSIS — G9340 Encephalopathy, unspecified: Secondary | ICD-10-CM | POA: Diagnosis not present

## 2023-05-11 DIAGNOSIS — Z7983 Long term (current) use of bisphosphonates: Secondary | ICD-10-CM

## 2023-05-11 DIAGNOSIS — I517 Cardiomegaly: Secondary | ICD-10-CM | POA: Diagnosis not present

## 2023-05-11 DIAGNOSIS — R4182 Altered mental status, unspecified: Secondary | ICD-10-CM | POA: Diagnosis present

## 2023-05-11 DIAGNOSIS — D630 Anemia in neoplastic disease: Secondary | ICD-10-CM | POA: Diagnosis present

## 2023-05-11 DIAGNOSIS — Z9884 Bariatric surgery status: Secondary | ICD-10-CM

## 2023-05-11 DIAGNOSIS — Z881 Allergy status to other antibiotic agents status: Secondary | ICD-10-CM

## 2023-05-11 DIAGNOSIS — C9000 Multiple myeloma not having achieved remission: Secondary | ICD-10-CM | POA: Diagnosis not present

## 2023-05-11 DIAGNOSIS — Z7401 Bed confinement status: Secondary | ICD-10-CM | POA: Diagnosis not present

## 2023-05-11 DIAGNOSIS — M810 Age-related osteoporosis without current pathological fracture: Secondary | ICD-10-CM | POA: Diagnosis present

## 2023-05-11 DIAGNOSIS — F03C0 Unspecified dementia, severe, without behavioral disturbance, psychotic disturbance, mood disturbance, and anxiety: Secondary | ICD-10-CM | POA: Diagnosis present

## 2023-05-11 DIAGNOSIS — E876 Hypokalemia: Secondary | ICD-10-CM | POA: Diagnosis present

## 2023-05-11 DIAGNOSIS — E890 Postprocedural hypothyroidism: Secondary | ICD-10-CM | POA: Diagnosis present

## 2023-05-11 DIAGNOSIS — Z833 Family history of diabetes mellitus: Secondary | ICD-10-CM

## 2023-05-11 DIAGNOSIS — Z90710 Acquired absence of both cervix and uterus: Secondary | ICD-10-CM

## 2023-05-11 DIAGNOSIS — M1990 Unspecified osteoarthritis, unspecified site: Secondary | ICD-10-CM | POA: Diagnosis present

## 2023-05-11 DIAGNOSIS — E871 Hypo-osmolality and hyponatremia: Secondary | ICD-10-CM | POA: Diagnosis not present

## 2023-05-11 DIAGNOSIS — N179 Acute kidney failure, unspecified: Secondary | ICD-10-CM | POA: Diagnosis not present

## 2023-05-11 DIAGNOSIS — Z743 Need for continuous supervision: Secondary | ICD-10-CM | POA: Diagnosis not present

## 2023-05-11 DIAGNOSIS — R64 Cachexia: Secondary | ICD-10-CM | POA: Diagnosis not present

## 2023-05-11 DIAGNOSIS — Z96642 Presence of left artificial hip joint: Secondary | ICD-10-CM | POA: Diagnosis present

## 2023-05-11 DIAGNOSIS — A419 Sepsis, unspecified organism: Secondary | ICD-10-CM | POA: Diagnosis present

## 2023-05-11 DIAGNOSIS — N200 Calculus of kidney: Secondary | ICD-10-CM | POA: Diagnosis not present

## 2023-05-11 DIAGNOSIS — N184 Chronic kidney disease, stage 4 (severe): Secondary | ICD-10-CM | POA: Diagnosis present

## 2023-05-11 DIAGNOSIS — D649 Anemia, unspecified: Secondary | ICD-10-CM | POA: Diagnosis present

## 2023-05-11 DIAGNOSIS — D696 Thrombocytopenia, unspecified: Secondary | ICD-10-CM | POA: Diagnosis not present

## 2023-05-11 DIAGNOSIS — M1712 Unilateral primary osteoarthritis, left knee: Secondary | ICD-10-CM | POA: Diagnosis present

## 2023-05-11 DIAGNOSIS — Z79890 Hormone replacement therapy (postmenopausal): Secondary | ICD-10-CM

## 2023-05-11 DIAGNOSIS — Z515 Encounter for palliative care: Secondary | ICD-10-CM | POA: Diagnosis not present

## 2023-05-11 DIAGNOSIS — I499 Cardiac arrhythmia, unspecified: Secondary | ICD-10-CM | POA: Diagnosis not present

## 2023-05-11 DIAGNOSIS — D631 Anemia in chronic kidney disease: Secondary | ICD-10-CM | POA: Diagnosis not present

## 2023-05-11 DIAGNOSIS — Z8249 Family history of ischemic heart disease and other diseases of the circulatory system: Secondary | ICD-10-CM

## 2023-05-11 DIAGNOSIS — R001 Bradycardia, unspecified: Secondary | ICD-10-CM | POA: Diagnosis not present

## 2023-05-11 DIAGNOSIS — Z96619 Presence of unspecified artificial shoulder joint: Secondary | ICD-10-CM

## 2023-05-11 DIAGNOSIS — Z96612 Presence of left artificial shoulder joint: Secondary | ICD-10-CM | POA: Diagnosis present

## 2023-05-11 LAB — URINALYSIS, W/ REFLEX TO CULTURE (INFECTION SUSPECTED)
Bilirubin Urine: NEGATIVE
Glucose, UA: NEGATIVE mg/dL
Ketones, ur: NEGATIVE mg/dL
Nitrite: NEGATIVE
Protein, ur: 30 mg/dL — AB
Specific Gravity, Urine: 1.005 (ref 1.005–1.030)
WBC, UA: >50 WBC/hpf (ref 0–5)
pH: 7 (ref 5.0–8.0)

## 2023-05-11 LAB — COMPREHENSIVE METABOLIC PANEL
ALT: 14 U/L (ref 0–44)
AST: 20 U/L (ref 15–41)
Albumin: <1.5 g/dL — ABNORMAL LOW (ref 3.5–5.0)
Alkaline Phosphatase: 145 U/L — ABNORMAL HIGH (ref 38–126)
Anion gap: 14 (ref 5–15)
BUN: 57 mg/dL — ABNORMAL HIGH (ref 8–23)
CO2: 21 mmol/L — ABNORMAL LOW (ref 22–32)
Calcium: 8.6 mg/dL — ABNORMAL LOW (ref 8.9–10.3)
Chloride: 93 mmol/L — ABNORMAL LOW (ref 98–111)
Creatinine, Ser: 3.8 mg/dL — ABNORMAL HIGH (ref 0.44–1.00)
GFR, Estimated: 11 mL/min — ABNORMAL LOW (ref >60)
Glucose, Bld: 108 mg/dL — ABNORMAL HIGH (ref 70–99)
Potassium: 2.5 mmol/L — CL (ref 3.5–5.1)
Sodium: 128 mmol/L — ABNORMAL LOW (ref 135–145)
Total Bilirubin: 0.9 mg/dL (ref 0.3–1.2)
Total Protein: 8.4 g/dL — ABNORMAL HIGH (ref 6.5–8.1)

## 2023-05-11 LAB — CBC WITH DIFFERENTIAL/PLATELET
Abs Immature Granulocytes: 0.7 10*3/uL — ABNORMAL HIGH (ref 0.00–0.07)
Basophils Absolute: 0.1 10*3/uL (ref 0.0–0.1)
Basophils Relative: 1 %
Eosinophils Absolute: 0.6 10*3/uL — ABNORMAL HIGH (ref 0.0–0.5)
Eosinophils Relative: 8 %
HCT: 29.3 % — ABNORMAL LOW (ref 36.0–46.0)
Hemoglobin: 10.3 g/dL — ABNORMAL LOW (ref 12.0–15.0)
Immature Granulocytes: 10 %
Lymphocytes Relative: 8 %
Lymphs Abs: 0.6 10*3/uL — ABNORMAL LOW (ref 0.7–4.0)
MCH: 33.4 pg (ref 26.0–34.0)
MCHC: 35.2 g/dL (ref 30.0–36.0)
MCV: 95.1 fL (ref 80.0–100.0)
Monocytes Absolute: 0.2 10*3/uL (ref 0.1–1.0)
Monocytes Relative: 3 %
Neutro Abs: 5.1 10*3/uL (ref 1.7–7.7)
Neutrophils Relative %: 70 %
Platelets: 82 10*3/uL — ABNORMAL LOW (ref 150–400)
RBC: 3.08 MIL/uL — ABNORMAL LOW (ref 3.87–5.11)
RDW: 18 % — ABNORMAL HIGH (ref 11.5–15.5)
Smear Review: NORMAL
WBC: 7.3 10*3/uL (ref 4.0–10.5)
nRBC: 0.3 % — ABNORMAL HIGH (ref 0.0–0.2)

## 2023-05-11 LAB — LIPASE, BLOOD: Lipase: 17 U/L (ref 11–51)

## 2023-05-11 MED ORDER — SODIUM CHLORIDE 0.9 % IV BOLUS
1000.0000 mL | Freq: Once | INTRAVENOUS | Status: AC
  Administered 2023-05-11: 1000 mL via INTRAVENOUS

## 2023-05-11 MED ORDER — POTASSIUM CHLORIDE 20 MEQ PO PACK
60.0000 meq | PACK | ORAL | Status: AC
  Administered 2023-05-12: 60 meq via ORAL
  Filled 2023-05-11: qty 3

## 2023-05-11 MED ORDER — SODIUM CHLORIDE 0.9 % IV BOLUS
1000.0000 mL | Freq: Once | INTRAVENOUS | Status: AC
  Administered 2023-05-12: 1000 mL via INTRAVENOUS

## 2023-05-11 MED ORDER — SODIUM CHLORIDE 0.9 % IV SOLN
2.0000 g | INTRAVENOUS | Status: DC
  Administered 2023-05-11: 2 g via INTRAVENOUS
  Filled 2023-05-11: qty 20

## 2023-05-11 MED ORDER — POTASSIUM CHLORIDE 10 MEQ/100ML IV SOLN
10.0000 meq | INTRAVENOUS | Status: AC
  Administered 2023-05-12 (×2): 10 meq via INTRAVENOUS
  Filled 2023-05-11 (×2): qty 100

## 2023-05-11 NOTE — ED Notes (Signed)
[place] placed at the bedside with IV cart supplies per MD [facility] request for PIV placement.

## 2023-05-11 NOTE — Telephone Encounter (Signed)
Revlimid will be delivered to patient's facility by [facility] on 10/10 or 10/11.   Lequita Halt RN informed on 10/9 via secure chat and asked to send a lenalidomide order for administration from the office (with a note that the med is being shipped to the facility) to the facility in order for them to give the medication to patient .   Per Dr. Marice Potter notes, patient was also instructed to take aspirin daily, but that is not currently in her med list. Informed Lequita Halt that if this order was not already given to the facility that order would also need to be sent in.

## 2023-05-11 NOTE — ED Notes (Signed)
IV noted to have small amount of swelling around the site r/t fluid bolus admin. Assessed and infiltration present. MD Scotty Court notified and IV team order placed for better access due to the amount of loose skin on patients upper extremities.

## 2023-05-11 NOTE — ED Triage Notes (Signed)
Pt BIB EMS from [facility] for AMS. It is unknown LKN, but staff stated at [facility] that the patient was having slurred speech. Pt A&O x1 with EMS and no slurred speech. At triage patient not having slurred speech and A&O x2.

## 2023-05-11 NOTE — Telephone Encounter (Signed)
FYI

## 2023-05-11 NOTE — ED Provider Notes (Signed)
[facility] Provider Note    Event Date/Time   First MD Initiated Contact with Patient 05/11/23 1954     (approximate)   History   Chief Complaint: Altered Mental Status   HPI  Kathy [name] is a 82 y.o. female with a history of dementia, pancreatitis, Roux-en-Y gastric bypass, hypertension who was sent to the ED from her SNF due to increased confusion from her baseline.  Unclear when this started but facility staff tonight think it has been going on for several days.  Patient reports "I do not feel good" but is not able to elaborate.     Physical Exam   Triage Vital Signs: ED Triage Vitals [05/11/23 1958]  Encounter Vitals Group     BP 135/78     Systolic BP Percentile      Diastolic BP Percentile      Pulse Rate 88     Resp 16     Temp 98.3 F (36.8 C)     Temp Source Oral     SpO2 99 %     Weight      Height      Head Circumference      Peak Flow      Pain Score      Pain Loc      Pain Education      Exclude from Growth Chart     Most recent vital signs: Vitals:   05/11/23 2100 05/11/23 2130  BP: 124/74 117/73  Pulse: 99 86  Resp: (!) 22 18  Temp:    SpO2: 100% 97%    General: Awake, no distress.  CV:  Good peripheral perfusion.  Regular rate and rhythm Resp:  Normal effort.  Clear to auscultation bilaterally Abd:  No distention.  Soft, some suprapubic tenderness Other:  Dry oral mucosa.  No rash   ED Results / Procedures / Treatments   Labs (all labs ordered are listed, but only abnormal results are displayed) Labs Reviewed  CBC WITH DIFFERENTIAL/PLATELET - Abnormal; Notable for the following components:      Result Value   RBC 3.08 (*)    Hemoglobin 10.3 (*)    HCT 29.3 (*)    RDW 18.0 (*)    Platelets 82 (*)    nRBC 0.3 (*)    Lymphs Abs 0.6 (*)    Eosinophils Absolute 0.6 (*)    Abs Immature Granulocytes 0.70 (*)    All other components within normal limits  URINALYSIS, W/ REFLEX TO CULTURE (INFECTION  SUSPECTED) - Abnormal; Notable for the following components:   Color, Urine AMBER (*)    APPearance CLOUDY (*)    Hgb urine dipstick MODERATE (*)    Protein, ur 30 (*)    Leukocytes,Ua LARGE (*)    Bacteria, UA MANY (*)    All other components within normal limits  COMPREHENSIVE METABOLIC PANEL - Abnormal; Notable for the following components:   Sodium 128 (*)    Potassium 2.5 (*)    Chloride 93 (*)    CO2 21 (*)    Glucose, Bld 108 (*)    BUN 57 (*)    Creatinine, Ser 3.80 (*)    Calcium 8.6 (*)    Total Protein 8.4 (*)    Albumin <1.5 (*)    Alkaline Phosphatase 145 (*)    GFR, Estimated 11 (*)    All other components within normal limits  URINE CULTURE  LIPASE, BLOOD     EKG  Interpreted by me Sinus rhythm rate of 85.  Normal axis, normal intervals.  Right bundle branch block.  No acute ischemic changes.   RADIOLOGY CT head interpreted by me, negative for mass or intracranial hemorrhage.  Radiology report reviewed.  X-ray chest unremarkable  CT abdomen pelvis pending   PROCEDURES:  .Critical Care  Performed by: Sharman Cheek, MD Authorized by: Sharman Cheek, MD   Critical care provider statement:    Critical care time (minutes):  35   Critical care time was exclusive of:  Separately billable procedures and treating other patients   Critical care was necessary to treat or prevent imminent or life-threatening deterioration of the following conditions:  Metabolic crisis, renal failure and CNS failure or compromise   Critical care was time spent personally by me on the following activities:  Development of treatment plan with patient or surrogate, discussions with consultants, evaluation of patient's response to treatment, examination of patient, obtaining history from patient or surrogate, ordering and performing treatments and interventions, ordering and review of laboratory studies, ordering and review of radiographic studies, pulse oximetry, re-evaluation  of patient's condition and review of old charts   Care discussed with: admitting provider      MEDICATIONS ORDERED IN ED: Medications  cefTRIAXone (ROCEPHIN) 2 g in sodium chloride 0.9 % 100 mL IVPB (0 g Intravenous Stopped 05/11/23 2139)  sodium chloride 0.9 % bolus 1,000 mL (has no administration in time range)  potassium chloride 10 mEq in 100 mL IVPB (has no administration in time range)  potassium chloride (KLOR-CON) packet 60 mEq (has no administration in time range)  sodium chloride 0.9 % bolus 1,000 mL (0 mLs Intravenous Stopped 05/11/23 2219)     IMPRESSION / MDM / ASSESSMENT AND PLAN / ED COURSE  I reviewed the triage vital signs and the nursing notes.  DDx: Dehydration, AKI, electrolyte abnormality, anemia, UTI, pneumonia, intracranial hemorrhage, intracranial mass, pancreatitis  Patient's presentation is most consistent with acute presentation with potential threat to life or bodily function.  Patient presents with worsening confusion, malaise.  Will check labs, CT head, chest x-ray, give IV fluids.  Vital signs are unremarkable, no signs of sepsis.   Clinical Course as of 05/11/23 2248  Wed May 11, 2023  2052 UA consistent with UTI.  Will start Rocephin [PS]    Clinical Course User Index [PS] Sharman Cheek, MD    ----------------------------------------- 10:46 PM on 05/11/2023 ----------------------------------------- Labs reveal acute renal insufficiency with a creatinine of 3.8, potassium 2.5, sodium 128.  BUN is elevated.  With her UTI and AKI, will obtain CT abdomen to rule out obstructing ureterolithiasis.  Case discussed with hospitalist for further management.   FINAL CLINICAL IMPRESSION(S) / ED DIAGNOSES   Final diagnoses:  Cystitis  AKI (acute kidney injury) (HCC)  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, psychotic disturbance, mood disturbance, or anxiety, unspecified dementia type (HCC)     Rx / DC Orders   ED Discharge Orders      None        Note:  This document was prepared using Dragon voice recognition software and may include unintentional dictation errors.   Sharman Cheek, MD 05/11/23 2249

## 2023-05-11 NOTE — ED Notes (Signed)
Patient back from CT. IV Team arrival at the bedside as well.

## 2023-05-11 NOTE — Progress Notes (Signed)
Unable to obtain PIV even w/ used of Ultrasound, attempted x3 w/o success, RN and MD notified.

## 2023-05-11 NOTE — Telephone Encounter (Signed)
Received notification from [facility]:   Delton Coombes,  Ms. Mccleery delivery has been scheduled.  Please see delivery details below.   Patient Name:  Kathy [name]  DOB:  [birth year]  Drug:  Lenalidomide  Copay:  $0  Prior Auth Date: (if required)  Not Necessary           Insurance:  AARP/UHC MAPD (OPT)           Before funding:            After funding:    Financial Assistance:  Not Necessary  Ship Date:  10/08  Expected Delivery Date:  10/09  **Medication is being delivered to facility**   Ardeen Fillers, CPhT Oncology Pharmacy Patient Advocate  [facility]  [phone] (phone) [phone] (fax) 05/11/2023 8:44 AM

## 2023-05-11 NOTE — ED Notes (Signed)
Per Hardie Lora, RN with IV Team, no success after 3 attempts at [place] IV placement. MD [facility] notified.

## 2023-05-11 NOTE — ED Notes (Signed)
Patient to CT.

## 2023-05-12 ENCOUNTER — Encounter: Payer: Self-pay | Admitting: Internal Medicine

## 2023-05-12 ENCOUNTER — Other Ambulatory Visit: Payer: Self-pay

## 2023-05-12 DIAGNOSIS — D849 Immunodeficiency, unspecified: Secondary | ICD-10-CM | POA: Diagnosis present

## 2023-05-12 DIAGNOSIS — E871 Hypo-osmolality and hyponatremia: Secondary | ICD-10-CM | POA: Diagnosis present

## 2023-05-12 DIAGNOSIS — R4182 Altered mental status, unspecified: Secondary | ICD-10-CM | POA: Diagnosis present

## 2023-05-12 DIAGNOSIS — N390 Urinary tract infection, site not specified: Secondary | ICD-10-CM | POA: Diagnosis not present

## 2023-05-12 DIAGNOSIS — D696 Thrombocytopenia, unspecified: Secondary | ICD-10-CM | POA: Diagnosis present

## 2023-05-12 DIAGNOSIS — N3090 Cystitis, unspecified without hematuria: Secondary | ICD-10-CM

## 2023-05-12 DIAGNOSIS — R400 Somnolence: Secondary | ICD-10-CM | POA: Diagnosis not present

## 2023-05-12 DIAGNOSIS — N179 Acute kidney failure, unspecified: Secondary | ICD-10-CM

## 2023-05-12 DIAGNOSIS — E870 Hyperosmolality and hypernatremia: Secondary | ICD-10-CM | POA: Diagnosis not present

## 2023-05-12 DIAGNOSIS — G9341 Metabolic encephalopathy: Secondary | ICD-10-CM | POA: Diagnosis not present

## 2023-05-12 DIAGNOSIS — E890 Postprocedural hypothyroidism: Secondary | ICD-10-CM | POA: Diagnosis present

## 2023-05-12 DIAGNOSIS — E43 Unspecified severe protein-calorie malnutrition: Secondary | ICD-10-CM | POA: Diagnosis present

## 2023-05-12 DIAGNOSIS — E785 Hyperlipidemia, unspecified: Secondary | ICD-10-CM | POA: Diagnosis present

## 2023-05-12 DIAGNOSIS — N184 Chronic kidney disease, stage 4 (severe): Secondary | ICD-10-CM | POA: Diagnosis present

## 2023-05-12 DIAGNOSIS — Z515 Encounter for palliative care: Secondary | ICD-10-CM

## 2023-05-12 DIAGNOSIS — F03C0 Unspecified dementia, severe, without behavioral disturbance, psychotic disturbance, mood disturbance, and anxiety: Secondary | ICD-10-CM | POA: Diagnosis present

## 2023-05-12 DIAGNOSIS — A4151 Sepsis due to Escherichia coli [E. coli]: Secondary | ICD-10-CM | POA: Diagnosis present

## 2023-05-12 DIAGNOSIS — N3000 Acute cystitis without hematuria: Secondary | ICD-10-CM | POA: Diagnosis present

## 2023-05-12 DIAGNOSIS — A419 Sepsis, unspecified organism: Secondary | ICD-10-CM | POA: Diagnosis not present

## 2023-05-12 DIAGNOSIS — I129 Hypertensive chronic kidney disease with stage 1 through stage 4 chronic kidney disease, or unspecified chronic kidney disease: Secondary | ICD-10-CM | POA: Diagnosis present

## 2023-05-12 DIAGNOSIS — R6520 Severe sepsis without septic shock: Secondary | ICD-10-CM | POA: Diagnosis present

## 2023-05-12 DIAGNOSIS — M069 Rheumatoid arthritis, unspecified: Secondary | ICD-10-CM | POA: Diagnosis present

## 2023-05-12 DIAGNOSIS — C9000 Multiple myeloma not having achieved remission: Secondary | ICD-10-CM | POA: Diagnosis present

## 2023-05-12 DIAGNOSIS — R64 Cachexia: Secondary | ICD-10-CM | POA: Diagnosis present

## 2023-05-12 DIAGNOSIS — E876 Hypokalemia: Secondary | ICD-10-CM | POA: Diagnosis present

## 2023-05-12 DIAGNOSIS — E8721 Acute metabolic acidosis: Secondary | ICD-10-CM | POA: Diagnosis not present

## 2023-05-12 DIAGNOSIS — E878 Other disorders of electrolyte and fluid balance, not elsewhere classified: Secondary | ICD-10-CM | POA: Diagnosis present

## 2023-05-12 DIAGNOSIS — D630 Anemia in neoplastic disease: Secondary | ICD-10-CM | POA: Diagnosis present

## 2023-05-12 LAB — MRSA NEXT GEN BY PCR, NASAL: MRSA by PCR Next Gen: NOT DETECTED

## 2023-05-12 LAB — BASIC METABOLIC PANEL
Anion gap: 12 (ref 5–15)
BUN: 60 mg/dL — ABNORMAL HIGH (ref 8–23)
CO2: 19 mmol/L — ABNORMAL LOW (ref 22–32)
Calcium: 8 mg/dL — ABNORMAL LOW (ref 8.9–10.3)
Chloride: 99 mmol/L (ref 98–111)
Creatinine, Ser: 3.75 mg/dL — ABNORMAL HIGH (ref 0.44–1.00)
GFR, Estimated: 12 mL/min — ABNORMAL LOW (ref >60)
Glucose, Bld: 93 mg/dL (ref 70–99)
Potassium: 4.4 mmol/L (ref 3.5–5.1)
Sodium: 130 mmol/L — ABNORMAL LOW (ref 135–145)

## 2023-05-12 LAB — PROTIME-INR
INR: 1.1 (ref 0.8–1.2)
Prothrombin Time: 14.6 s (ref 11.4–15.2)

## 2023-05-12 LAB — TSH: TSH: 13.889 u[IU]/mL — ABNORMAL HIGH (ref 0.350–4.500)

## 2023-05-12 LAB — MAGNESIUM: Magnesium: 2.1 mg/dL (ref 1.7–2.4)

## 2023-05-12 LAB — CORTISOL-AM, BLOOD: Cortisol - AM: 13.7 ug/dL (ref 6.7–22.6)

## 2023-05-12 LAB — PROCALCITONIN: Procalcitonin: 3.96 ng/mL

## 2023-05-12 LAB — T4, FREE: Free T4: 0.7 ng/dL (ref 0.61–1.12)

## 2023-05-12 MED ORDER — SODIUM CHLORIDE 0.9% FLUSH
3.0000 mL | Freq: Two times a day (BID) | INTRAVENOUS | Status: DC
  Administered 2023-05-12 – 2023-05-17 (×13): 3 mL via INTRAVENOUS

## 2023-05-12 MED ORDER — SODIUM CHLORIDE 0.9 % IV SOLN
1.0000 g | INTRAVENOUS | Status: DC
  Administered 2023-05-12 – 2023-05-13 (×2): 1 g via INTRAVENOUS
  Filled 2023-05-12 (×4): qty 10

## 2023-05-12 MED ORDER — MIDODRINE HCL 5 MG PO TABS
7.5000 mg | ORAL_TABLET | Freq: Three times a day (TID) | ORAL | Status: DC
  Administered 2023-05-12 – 2023-05-18 (×18): 7.5 mg via ORAL
  Filled 2023-05-12 (×16): qty 2
  Filled 2023-05-12: qty 1.5
  Filled 2023-05-12 (×3): qty 2

## 2023-05-12 MED ORDER — ACYCLOVIR 200 MG PO CAPS
400.0000 mg | ORAL_CAPSULE | Freq: Two times a day (BID) | ORAL | Status: DC
  Administered 2023-05-12 – 2023-05-18 (×13): 400 mg via ORAL
  Filled 2023-05-12 (×14): qty 2

## 2023-05-12 MED ORDER — POTASSIUM CHLORIDE 10 MEQ/100ML IV SOLN
10.0000 meq | INTRAVENOUS | Status: AC
  Administered 2023-05-12 (×2): 10 meq via INTRAVENOUS
  Filled 2023-05-12 (×2): qty 100

## 2023-05-12 MED ORDER — ACETAMINOPHEN 325 MG PO TABS
650.0000 mg | ORAL_TABLET | Freq: Four times a day (QID) | ORAL | Status: DC | PRN
  Administered 2023-05-14: 650 mg via ORAL
  Filled 2023-05-12: qty 2

## 2023-05-12 MED ORDER — ACETAMINOPHEN 650 MG RE SUPP: 650.0000 mg | Freq: Four times a day (QID) | RECTAL | Status: DC | PRN

## 2023-05-12 MED ORDER — LACTATED RINGERS IV SOLN: Freq: Once | INTRAVENOUS | Status: DC

## 2023-05-12 MED ORDER — SODIUM CHLORIDE 0.9 % IV SOLN: Freq: Once | INTRAVENOUS | Status: AC

## 2023-05-12 MED ORDER — PANTOPRAZOLE SODIUM 40 MG IV SOLR
40.0000 mg | Freq: Two times a day (BID) | INTRAVENOUS | Status: DC
  Administered 2023-05-12: 40 mg via INTRAVENOUS
  Filled 2023-05-12: qty 10

## 2023-05-12 MED ORDER — SODIUM CHLORIDE 0.9 % IV SOLN: 2.0000 g | INTRAVENOUS | Status: DC

## 2023-05-12 MED ORDER — HEPARIN SODIUM (PORCINE) 5000 UNIT/ML IJ SOLN
5000.0000 [IU] | Freq: Three times a day (TID) | INTRAMUSCULAR | Status: DC
  Administered 2023-05-12 – 2023-05-13 (×2): 5000 [IU] via SUBCUTANEOUS
  Filled 2023-05-12 (×2): qty 1

## 2023-05-12 MED ORDER — ENSURE ENLIVE PO LIQD
237.0000 mL | Freq: Two times a day (BID) | ORAL | Status: DC
  Administered 2023-05-12 – 2023-05-17 (×8): 237 mL via ORAL

## 2023-05-12 MED ORDER — POLYETHYLENE GLYCOL 3350 17 G PO PACK
17.0000 g | PACK | Freq: Every day | ORAL | Status: DC
  Administered 2023-05-12 – 2023-05-18 (×5): 17 g via ORAL
  Filled 2023-05-12 (×6): qty 1

## 2023-05-12 MED ORDER — PANTOPRAZOLE SODIUM 40 MG PO TBEC
40.0000 mg | DELAYED_RELEASE_TABLET | Freq: Every day | ORAL | Status: DC
  Administered 2023-05-12 – 2023-05-18 (×7): 40 mg via ORAL
  Filled 2023-05-12 (×7): qty 1

## 2023-05-12 MED ORDER — SODIUM CHLORIDE 0.9 % IV SOLN: INTRAVENOUS | Status: DC

## 2023-05-12 NOTE — Progress Notes (Signed)
PHARMACY CONSULT NOTE - FOLLOW UP  Pharmacy Consult for Electrolyte Monitoring and Replacement   Recent Labs: Potassium (mmol/L)  Date Value  05/12/2023 4.4   Magnesium (mg/dL)  Date Value  08/65/7846 2.1   Calcium (mg/dL)  Date Value  96/29/5284 8.0 (L)   Albumin (g/dL)  Date Value  13/24/4010 <1.5 (L)  03/18/2021 3.2 (L)   Phosphorus (mg/dL)  Date Value  27/25/3664 1.8 (L)   Sodium (mmol/L)  Date Value  05/12/2023 130 (L)  02/23/2023 135     Assessment: 82 yo female presented to ED by EMS for [facility] SNF due to increased confusion.  PMH includes multiple myeloma, GERD, HTN, dementia, and anemia.  Pharmacy consulted to replace and monitor electrolytes  Goal of Therapy:  Electrolytes WNL   Plan:  No additional replacement indicated at this time Follow-up electrolytes with AM labs  Barrie Folk ,PharmD Clinical Pharmacist 05/12/2023 7:11 AM

## 2023-05-12 NOTE — Assessment & Plan Note (Signed)
We will follow.  No bleeding.  Scd;s

## 2023-05-12 NOTE — TOC Initial Note (Signed)
Transition of Care [facility]) - Initial/Assessment Note    Patient Details  Name: Kathy [name] MRN: [id number] Date of Birth: 1941-06-17  [facility]) CM/SW Contact:    Marquita Palms, LCSW Phone Number: 05/12/2023, 8:39 AM  Clinical Narrative:                  Patient from [facility]. CSW called to verify information with facility. Awaiting response from facility concerning patient.       Patient Goals and CMS Choice            Expected Discharge Plan and Services                                              Prior Living Arrangements/Services                       Activities of Daily Living      Permission Sought/Granted                  Emotional Assessment              Admission diagnosis:  AMS (altered mental status) [R41.82] Patient Active Problem List   Diagnosis Date Noted   Sepsis secondary to UTI (HCC) 05/12/2023   Electrolyte abnormality 05/12/2023   Thrombocytopenia (HCC) 05/12/2023   AMS (altered mental status) 05/12/2023   Multiple myeloma (HCC) 04/14/2023   Hypercalcemia of malignancy 04/07/2023   General weakness 04/07/2023   Recurrent falls 04/07/2023   Hypotension 04/07/2023   Hypoalbuminemia due to protein-calorie malnutrition (HCC) 04/07/2023   Acute prostatitis 04/07/2023   Chronic kidney disease, stage 3b (HCC) 04/07/2023   Dysphagia 04/07/2023   Acute pancreatitis 04/07/2023   History of thyroid cancer 04/07/2023   Anemia 03/30/2023   Physical deconditioning 03/30/2023   Elevated serum immunoglobulin free light chain level 03/30/2023   Leg swelling 02/21/2023   Hospital discharge follow-up 02/21/2023   Hypomagnesemia 02/21/2023   Urge incontinence of urine 02/21/2023   Hypercalcemia 01/23/2023   Acute kidney injury superimposed on stage 4 chronic kidney disease (HCC) 01/23/2023   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   Hammer toe of left foot  09/15/2022   Left foot pain 09/15/2022   Iron deficiency anemia due to chronic blood loss 07/19/2022   Rectal bleeding 01/21/2022   Gastrojejunal ulcer with hemorrhage    Encounter for general adult medical examination with abnormal findings 09/16/2021   Hypokalemia 09/16/2021   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   Bilateral impacted cerumen 11/19/2020   Hearing loss 11/19/2020   Malignant tumor of thyroid gland (HCC) 10/08/2020   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   Polyarthritis    Bariatric surgery status    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/2017   Rheumatoid arthritis (HCC) 11/16/2016   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   Rotator cuff arthropathy, 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   S/P shoulder replacement 05/11/2016   Allergic rhinitis 10/19/2013   GERD (gastroesophageal reflux disease) 10/19/2013   Osteoporosis    Essential hypertension    Acquired hypothyroidism    Hyperlipidemia    Anxiety    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   Unspecified protein-calorie malnutrition (HCC) 10/14/2011   Vitamin deficiency 10/14/2011   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 [place] [address] Phone: [phone] Fax: [phone]  [facility] - [place], [place] - [address] [place] [address] Phone: [phone] Fax: [phone]  [facility] [place], [place] - [address] [phone] [facility] [address] [place] [address] Phone: [phone] Fax: [phone]     Social Determinants of Health (SDOH) Social History: SDOH Screenings   Food Insecurity: No Food Insecurity (04/07/2023)  Housing: Low Risk  (04/07/2023)  Transportation Needs: No Transportation Needs (04/07/2023)  Utilities: Not At Risk  (04/07/2023)  Alcohol Screen: Low Risk  (09/14/2021)  Depression (PHQ2-9): Low Risk  (03/30/2023)  Financial Resource Strain: Low Risk  (09/14/2021)  Physical Activity: Sufficiently Active (09/14/2021)  Social Connections: Moderately Isolated (09/14/2021)  Stress: No Stress Concern Present (09/14/2021)  Tobacco Use: Low Risk  (04/06/2023)   SDOH Interventions:     Readmission Risk Interventions    03/16/2023   10:32 AM  Readmission Risk Prevention Plan  Transportation Screening Complete  PCP or Specialist Appt within 3-5 Days Complete  HRI or Home Care Consult Complete  Social Work Consult for Recovery Care Planning/Counseling Complete  Palliative Care Screening Not Applicable  Medication Review Oceanographer) Complete

## 2023-05-12 NOTE — Assessment & Plan Note (Signed)
    Latest Ref Rng & Units 05/11/2023    8:43 PM 05/10/2023   11:17 AM 05/03/2023    8:31 AM  CBC  WBC 4.0 - 10.5 K/uL 7.3  6.3  3.1   Hemoglobin 12.0 - 15.0 g/dL 40.9  8.7  7.7   Hematocrit 36.0 - 46.0 % 29.3  25.1  23.5   Platelets 150 - 400 K/uL 82  111  127   Type/ screen. IV PPI. Follow.  Stool guaiac.

## 2023-05-12 NOTE — Assessment & Plan Note (Signed)
Continue patient on levothyroxine at 50 mcg/day.

## 2023-05-12 NOTE — Progress Notes (Addendum)
PROGRESS NOTE    Kathy [name]  VHQ:[id number] DOB: 10/07/1940 DOA: 05/11/2023 PCP: [name], MD  Outpatient Specialists: oncology    Brief Narrative:   Kathy [name] is a 82 y.o. female with medical history significant for history of anemia, thrombocytopenia, currently on chemotherapy withDaratumumab SQ + Lenalidomide + Dexamethasone (DaraRd) q28d for multiple myeloma, peptic ulcer disease, GERD, hypertension, hypothyroidism, osteoporosis, obesity, sleep apnea, history of pancreatitis, patient is a limited historian and does not give HPI it is per chart review and per nursing note.  Per report patient states that she does not feel well.    Assessment & Plan:   Active Problems:   Essential hypertension   Acquired hypothyroidism   GERD (gastroesophageal reflux disease)   S/P shoulder replacement   Rheumatoid arthritis (HCC)   AKI (acute kidney injury) (HCC)   History of Roux-en-Y gastric bypass   Anemia   Multiple myeloma (HCC)   Sepsis secondary to UTI (HCC)   Electrolyte abnormality   Thrombocytopenia (HCC)  # Encephalopathy Likely 2/2 uti and aki and multiple myeloma. CT head neg, cxr nothing acute, no intra-abdominal acute pathology on CT of abdomen/pelvis, no signs liver dysfunction. Abx started prior to blood cultures. No report of seizure-like activity - treat as below - monitor cultures  # UTI? Very poor historian currently but urinalysis is suggestive of infection. Is immune compromised - cont ceftriaxone - monitor cultures  # AKI Baseline normal kidney function, here in the 3s where it has been for the last couple of days. CT stone protocol no signs obstruction - continue fluids - monitor for diarrhea/vomiting - hold home lasix  # Multiple myeloma Recent diagnosis, started on darzalex and dexamethasone - cont acyclovir  # Hx GI bleed 2/2 GJ ulcer. Hgb here stable from priors - monitor  # Hypothyroid - cont home synthroid - f/u tsh  #  RA On weekly adalimumab at home  # Chronic hypotension - cont home midodrine  # End-of-life care Multiple admissions this year, poor functional capacity, multiple severe medical problems as above - prognosis is poor - palliative consult  DVT prophylaxis: heparin Code Status: full Family Communication: brother Jonny Ruiz updated telephonically 10/10  Level of care: Progressive Status is: Inpatient Remains inpatient appropriate because: severity of illness    Consultants:  none  Procedures: none  Antimicrobials:  ceftriaxone    Subjective: Confused, no complaints  Objective: Vitals:   05/12/23 0430 05/12/23 0431 05/12/23 0445 05/12/23 0745  BP: 102/63   112/63  Pulse: (!) 101  94 100  Resp:  (!) 26 (!) 22 18  Temp:  97.9 F (36.6 C)    TempSrc:  Oral    SpO2: 99%  98% 98%    Intake/Output Summary (Last 24 hours) at 05/12/2023 0906 Last data filed at 05/12/2023 4132 Gross per 24 hour  Intake 100 ml  Output 400 ml  Net -300 ml   There were no vitals filed for this visit.  Examination:  General exam: Appears calm and comfortable  Respiratory system: Clear to auscultation. Respiratory effort normal. Cardiovascular system: S1 & S2 heard, RRR. No JVD, murmurs, rubs, gallops or clicks. No pedal edema. Gastrointestinal system: Abdomen is nondistended, soft and nontender. No organomegaly or masses felt.   Central nervous system: Alert and oriented to self, moving all 4 Extremities: Symmetric 5 x 5 power. Skin: No rashes, lesions or ulcers Psychiatry: calm and confused    Data Reviewed: I have personally reviewed following labs and imaging studies  CBC: Recent Labs  Lab 05/10/23 1117 05/11/23 2043  WBC 6.3 7.3  NEUTROABS 5.3 5.1  HGB 8.7* 10.3*  HCT 25.1* 29.3*  MCV 98.0 95.1  PLT 111* 82*   Basic Metabolic Panel: Recent Labs  Lab 05/10/23 1117 05/11/23 2114 05/12/23 0422  NA 130* 128* 130*  K 2.5* 2.5* 4.4  CL 93* 93* 99  CO2 25 21* 19*   GLUCOSE 97 108* 93  BUN 40* 57* 60*  CREATININE 3.27* 3.80* 3.75*  CALCIUM 9.2 8.6* 8.0*  MG 1.6* 2.1  --    GFR: Estimated Creatinine Clearance: 10.1 mL/min (A) (by C-G formula based on SCr of 3.75 mg/dL (H)). Liver Function Tests: Recent Labs  Lab 05/10/23 1117 05/11/23 2114  AST 19 20  ALT 13 14  ALKPHOS 134* 145*  BILITOT 0.8 0.9  PROT 8.9* 8.4*  ALBUMIN <1.5* <1.5*   Recent Labs  Lab 05/11/23 2114  LIPASE 17   No results for input(s): "AMMONIA" in the last 168 hours. Coagulation Profile: Recent Labs  Lab 05/12/23 0527  INR 1.1   Cardiac Enzymes: No results for input(s): "CKTOTAL", "CKMB", "CKMBINDEX", "TROPONINI" in the last 168 hours. BNP (last 3 results) No results for input(s): "PROBNP" in the last 8760 hours. HbA1C: No results for input(s): "HGBA1C" in the last 72 hours. CBG: No results for input(s): "GLUCAP" in the last 168 hours. Lipid Profile: No results for input(s): "CHOL", "HDL", "LDLCALC", "TRIG", "CHOLHDL", "LDLDIRECT" in the last 72 hours. Thyroid Function Tests: No results for input(s): "TSH", "T4TOTAL", "FREET4", "T3FREE", "THYROIDAB" in the last 72 hours. Anemia Panel: No results for input(s): "VITAMINB12", "FOLATE", "FERRITIN", "TIBC", "IRON", "RETICCTPCT" in the last 72 hours. Urine analysis:    Component Value Date/Time   COLORURINE AMBER (A) 05/11/2023 2012   APPEARANCEUR CLOUDY (A) 05/11/2023 2012   LABSPEC 1.005 05/11/2023 2012   PHURINE 7.0 05/11/2023 2012   GLUCOSEU NEGATIVE 05/11/2023 2012   HGBUR MODERATE (A) 05/11/2023 2012   BILIRUBINUR NEGATIVE 05/11/2023 2012   KETONESUR NEGATIVE 05/11/2023 2012   PROTEINUR 30 (A) 05/11/2023 2012   UROBILINOGEN 0.2 09/26/2013 1042   NITRITE NEGATIVE 05/11/2023 2012   LEUKOCYTESUR LARGE (A) 05/11/2023 2012   Sepsis Labs: @LABRCNTIP (procalcitonin:4,lacticidven:4)  )No results found for this or any previous visit (from the past 240 hour(s)).       Radiology Studies: CT Renal  Stone Study  Result Date: 05/12/2023 CLINICAL DATA:  Abdominal and flank pain EXAM: CT ABDOMEN AND PELVIS WITHOUT CONTRAST TECHNIQUE: Multidetector CT imaging of the abdomen and pelvis was performed following the standard protocol without IV contrast. RADIATION DOSE REDUCTION: This exam was performed according to the departmental dose-optimization program which includes automated exposure control, adjustment of the mA and/or kV according to patient size and/or use of iterative reconstruction technique. COMPARISON:  04/06/2023 FINDINGS: Lower chest: No acute abnormality. Bibasilar atelectasis. Stable small pericardial effusion. Hepatobiliary: No focal liver abnormality is seen. Status post cholecystectomy. No biliary dilatation. Pancreas: Unremarkable Spleen: Unremarkable Adrenals/Urinary Tract: The adrenal glands are unremarkable. The kidneys are normal in size and position. 7 mm nonobstructing calculus noted within the interpolar region of the left kidney. The kidneys are otherwise unremarkable. The bladder is obscured by streak artifact. Stomach/Bowel: Surgical changes of Roux-en-Y gastric bypass are identified. Moderate descending and sigmoid colonic diverticulosis without superimposed acute inflammatory change. Evaluation of pelvis is slightly limited by streak artifact. The stomach, small bowel, and large bowel are otherwise unremarkable. Appendix normal. No free intraperitoneal gas or fluid. Vascular/Lymphatic: Mild aortoiliac atherosclerotic calcification.  No aortic aneurysm. No pathologic adenopathy within the abdomen and pelvis. Reproductive: Obscured by streak artifact Other: No abdominal wall hernia. Moderate diffuse subcutaneous body wall edema. 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 Osseous structures are diffusely osteopenic. 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 IMPRESSION: 1. No acute  intra-abdominal pathology identified. No definite radiographic explanation for the patient's reported symptoms. 2. Mild left nonobstructing nephrolithiasis. No urolithiasis. No hydronephrosis. 3. Moderate distal colonic diverticulosis without superimposed acute inflammatory change. 4. Stable small pericardial effusion. 5.  Aortic Atherosclerosis (ICD10-I70.0). Electronically Signed   By: Helyn Numbers M.D.   On: 05/12/2023 00:24   CT Head Wo Contrast  Result Date: 05/11/2023 CLINICAL DATA:  Altered mental status EXAM: CT HEAD WITHOUT CONTRAST TECHNIQUE: Contiguous axial images were obtained from the base of the skull through the vertex without intravenous contrast. RADIATION DOSE REDUCTION: This exam was performed according to the departmental dose-optimization program which includes automated exposure control, adjustment of the mA and/or kV according to patient size and/or use of iterative reconstruction technique. COMPARISON:  CT 03/11/2023 01/04/2023, MRI 03/11/2023 FINDINGS: Brain: No acute territorial infarction, hemorrhage or intracranial mass. The ventricles are nonenlarged. Mild chronic small vessel ischemic changes of the white matter Vascular: No hyperdense vessels.  Carotid vascular calcification Skull: No fracture. Heterogeneous lucencies throughout the skull base and calvarium with multiple small lucent lesions, similar compared with recent priors, increased compared to 2018 Sinuses/Orbits: No acute finding. Other: None IMPRESSION: 1. No CT evidence for acute intracranial abnormality. 2. Mild chronic small vessel ischemic changes of the white matter. 3. Heterogeneous lucencies throughout the skull base and calvarium with multiple small lucent lesions, similar compared with recent priors, increased compared to 2018. Correlate for any history myeloma or marrow disease. Electronically Signed   By: Jasmine Pang M.D.   On: 05/11/2023 22:37   DG Chest Portable 1 View  Result Date:  05/11/2023 CLINICAL DATA:  Weakness and confusion.  Altered mental status. EXAM: PORTABLE CHEST 1 VIEW COMPARISON:  Chest radiograph 04/06/2023, CT 03/11/2023 FINDINGS: Mild cardiomegaly. Stable mediastinal contours. Aortic atherosclerosis. Minimal ill-defined opacity in the right infrahilar region, likely tortuous vessels when compared with prior CT. No acute airspace disease, large pleural effusion, or pneumothorax. 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 No change from prior exam. The left shoulder reverse arthroplasty appears intact. IMPRESSION: 1. Mild cardiomegaly. No acute intrathoracic abnormality. 2. 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 Recommend dedicated radiographs of the shoulder. Electronically Signed   By: Narda Rutherford M.D.   On: 05/11/2023 22:03        Scheduled Meds:  pantoprazole (PROTONIX) IV  40 mg Intravenous Q12H   sodium chloride flush  3 mL Intravenous Q12H   Continuous Infusions:  cefTRIAXone (ROCEPHIN)  IV       LOS: 0 days     Silvano Bilis, MD [facility]   If 7PM-7AM, please contact night-coverage [email or web address] Password [email or web address] 05/12/2023, 9:06 AM

## 2023-05-12 NOTE — Assessment & Plan Note (Signed)
Vitals:   05/11/23 1958 05/11/23 2100 05/11/23 2130 05/11/23 2230  BP: 135/78 124/74 117/73 114/63   05/11/23 2348 05/12/23 0100  BP: 110/83 139/77  Home diuretic Lasix. Continue with IV fluid hydration.

## 2023-05-12 NOTE — ED Notes (Signed)
Pt resting comfortably with eyes closed at this time. Respirations even and unlabored. NAD

## 2023-05-12 NOTE — TOC Initial Note (Signed)
Transition of Care [facility]) - Initial/Assessment Note    Patient Details  Name: Kathy [name] MRN: [id number] Date of Birth: [birth year]  Transition of Care [facility]) CM/SW Contact:    Marquita Palms, LCSW Phone Number: 05/12/2023, 1:12 PM  Clinical Narrative:                  Health risk assessment completed with facility. CSW met with patient at bedside. Patient confused when speaking with CSW. CSW called to speak with Tiffany with [employer] who reports that she would like to receive information on changes with her and discharge. No other needs at this time.  Expected Discharge Plan: Skilled Nursing Facility Barriers to Discharge: Continued Medical Work up   Patient Goals and CMS Choice            Expected Discharge Plan and Services     Post Acute Care Choice: Skilled Nursing Facility                                        Prior Living Arrangements/Services   Lives with:: Facility Resident   Do you feel safe going back to the place where you live?: Yes               Activities of Daily Living   ADL Screening (condition at time of admission) Independently performs ADLs?: No Does the patient have a NEW difficulty with bathing/dressing/toileting/self-feeding that is expected to last >3 days?: No Does the patient have a NEW difficulty with getting in/out of bed, walking, or climbing stairs that is expected to last >3 days?: No Does the patient have a NEW difficulty with communication that is expected to last >3 days?: No Is the patient deaf or have difficulty hearing?: No Does the patient have difficulty seeing, even when wearing glasses/contacts?: Yes Does the patient have difficulty concentrating, remembering, or making decisions?: Yes  Permission Sought/Granted                  Emotional Assessment Appearance:: Disheveled     Orientation: : Oriented to Self, Oriented to Place, Oriented to  Time, Oriented to Situation       Admission diagnosis:  AKI (acute kidney injury) (HCC) [N17.9] Cystitis [N30.90] AMS (altered mental status) [R41.82] 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, psychotic disturbance, mood disturbance, or anxiety, unspecified dementia type (HCC) [F03.C0] Patient Active Problem List   Diagnosis Date Noted   Sepsis secondary to UTI (HCC) 05/12/2023   Electrolyte abnormality 05/12/2023   Thrombocytopenia (HCC) 05/12/2023   AMS (altered mental status) 05/12/2023   Multiple myeloma (HCC) 04/14/2023   Hypercalcemia of malignancy 04/07/2023   General weakness 04/07/2023   Recurrent falls 04/07/2023   Hypotension 04/07/2023   Hypoalbuminemia due to protein-calorie malnutrition (HCC) 04/07/2023   Acute prostatitis 04/07/2023   Chronic kidney disease, stage 3b (HCC) 04/07/2023   Dysphagia 04/07/2023   Acute pancreatitis 04/07/2023   History of thyroid cancer 04/07/2023   Anemia 03/30/2023   Physical deconditioning 03/30/2023   Elevated serum immunoglobulin free light chain level 03/30/2023   Leg swelling 02/21/2023   Hospital discharge follow-up 02/21/2023   Hypomagnesemia 02/21/2023   Urge incontinence of urine 02/21/2023   Hypercalcemia 01/23/2023   AKI (acute kidney injury) (HCC) 01/23/2023   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   Hammer toe of left foot 09/15/2022   Left foot pain 09/15/2022  Iron deficiency anemia due to chronic blood loss 07/19/2022   Rectal bleeding 01/21/2022   Gastrojejunal ulcer with hemorrhage    Encounter for general adult medical examination with abnormal findings 09/16/2021   Hypokalemia 09/16/2021   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   Bilateral impacted cerumen 11/19/2020   Hearing loss 11/19/2020   Malignant tumor of thyroid gland (HCC) 10/08/2020   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   Polyarthritis    Bariatric surgery status    Primary localized  osteoarthritis of left knee 11/29/2017   Rheumatoid arthritis (HCC) 11/16/2016   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   Rotator cuff arthropathy, 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   S/P shoulder replacement 05/11/2016   Allergic rhinitis 10/19/2013   GERD (gastroesophageal reflux disease) 10/19/2013   Osteoporosis    Essential hypertension    Acquired hypothyroidism    Hyperlipidemia    Anxiety    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 [place] [address] Phone: [phone] Fax: [phone]  [facility] [place], [place] - [address] [address] [phone] [facility] [address] [place] [address] Phone: [phone] Fax: [phone]     Social Determinants of Health (SDOH) Social History: SDOH Screenings   Food Insecurity: Patient Unable To Answer (05/12/2023)  Housing: High Risk (05/12/2023)  Transportation Needs: Patient Unable To Answer (05/12/2023)  Utilities: Patient Unable To Answer (05/12/2023)  Alcohol Screen: Low Risk  (09/14/2021)  Depression (PHQ2-9): Low Risk  (03/30/2023)  Financial Resource Strain: Low Risk  (09/14/2021)  Physical Activity: Sufficiently Active (09/14/2021)  Social Connections: Moderately Isolated (09/14/2021)  Stress: No Stress Concern Present (09/14/2021)  Tobacco Use: Low Risk  (05/12/2023)   SDOH Interventions:     Readmission Risk Interventions    05/12/2023    1:07 PM 03/16/2023   10:32 AM  Readmission Risk Prevention Plan  Transportation Screening Complete Complete  PCP or Specialist Appt within 3-5 Days  Complete   HRI or Home Care Consult  Complete  Social Work Consult for Recovery Care Planning/Counseling  Complete  Palliative Care Screening  Not Applicable  Medication Review [employer])  Complete  PCP or Specialist appointment within 3-5 days of discharge Complete   HRI or Home Care Consult Complete   SW Recovery Care/Counseling Consult Complete   Palliative Care Screening Not Applicable   Skilled Nursing Facility Complete

## 2023-05-12 NOTE — Assessment & Plan Note (Deleted)
Lab Results  Component Value Date   CREATININE 3.80 (H) 05/11/2023   CREATININE 3.27 (H) 05/10/2023   CREATININE 0.77 05/03/2023  Due to acute nature of worsening kidney function, we will currently hold patient's Lasix, avoid contrast.

## 2023-05-12 NOTE — H&P (Signed)
History and Physical    Patient: Kathy [name] PPI:[id number] DOB: 1941/01/30 DOA: 05/11/2023 DOS: the patient was seen and examined on 05/12/2023 PCP: [name], MD  Patient coming from: [facility]   Chief Complaint:  Chief Complaint  Patient presents with   Altered Mental Status    HPI: Kathy [name] is a 82 y.o. female with medical history significant for history of anemia, thrombocytopenia, currently on chemotherapy withDaratumumab SQ + Lenalidomide + Dexamethasone (DaraRd) q28d for multiple myeloma, peptic ulcer disease, GERD, hypertension, hypothyroidism, osteoporosis, obesity, sleep apnea, history of pancreatitis, patient is a limited historian and does not give HPI it is per chart review and per nursing note.  Per report patient states that she does not feel well.  In ED patient is cooperative and oriented to place, follows commands, has difficulty moving in bed suspect secondary to aging and may be ambulatory dysfunction due to deconditioning and aging, nonfocal otherwise.   Pt meets sepsis criteria with  tachycardia and rr and source.    Labs show hyponatremia 128 hypokalemia of 2.5 bicarb of 21 and 90 abnormal at 14 AKI of 3.80 with EGFR of 11 alk phos 145 total protein 8.4, anemia with a hemoglobin of 10.3 platelet count of 82 white count of 7.3.  Urine is cloudy showing moderate hemoglobin large leukocytes more than 50 WBCs consistent with urinary tract infection initial head CT done for patient's confusion showed mild chronic small vessel ischemic changes, no acute intracranial abnormalities,Heterogeneous lucencies throughout the skull base and calvarium with multiple small lucent lesions, similar compared with recent  priors, increased compared to 2018 c/w her disease of myeloma.  X-ray of chest shows cardiomegaly and no other acute intrathoracic abnormality, abnormal appearance of the reverse right shoulder arthroplasty recommended repeat imaging with  dedicated shoulder radiographs , will defer to a.m. team.  Review of Systems  Unable to perform ROS: Age   Past Medical History:  Diagnosis Date   Abnormal findings on diagnostic imaging of heart and coronary circulation    Anemia    FROM BLEEDING ULCER   Anxiety    takes Alprazolam daily as needed   Arthritis    dx with RA 2017   Bariatric surgery status    Cataract    Diverticulosis    Gastro-esophageal reflux disease with esophagitis    Gastrojejunal ulcer with hemorrhage    Headache(784.0)    occasionally   History of blood transfusion    no abnormal reaction noted   History of bronchitis    last time many yrs ago   Hyperlipidemia    PT DENIES THIS DX -  ON NO MEDS AND NO ONE HAS TOLD HER   Hypertension    takes Amlodipine daily   Hypothyroidism    takes Synthroid daily   Insomnia    Joint pain    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   Localized edema    Nocturia    Numbness    occasionally left arm at night   Obesity    Osteoporosis    takes Fosamax weekly   Pain in joint involving pelvic region and thigh    Peripheral edema    takes Lasix daily as needed   PONV (postoperative nausea and vomiting)    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/2017   Rheumatoid arthritis, unspecified (HCC)    Rotator cuff arthropathy, 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   Sleep apnea    Phreesia 08/22/2020   Stomach ulcer  Thyroid disease    Phreesia 08/22/2020   Unspecified injury of muscle(s) and tendon(s) of the rotator cuff of left shoulder, subsequent encounter    Unspecified osteoarthritis, unspecified site    Wears glasses    Wears partial dentures    Past Surgical History:  Procedure Laterality Date   ABDOMINAL HYSTERECTOMY     partial   cataract surgery Bilateral    CHOLECYSTECTOMY     COLONOSCOPY     ESOPHAGOGASTRODUODENOSCOPY (EGD) WITH PROPOFOL N/A 01/11/2022   Procedure: ESOPHAGOGASTRODUODENOSCOPY (EGD) WITH PROPOFOL;  Surgeon: Iva Boop, MD;   Location: [facility] ENDOSCOPY;  Service: Gastroenterology;  Laterality: N/A;   EYE SURGERY     CATARACTS BOTH   gastric bypass surgery     HOT HEMOSTASIS N/A 01/11/2022   Procedure: HOT HEMOSTASIS (ARGON PLASMA COAGULATION/BICAP);  Surgeon: Iva Boop, MD;  Location: [facility] ENDOSCOPY;  Service: Gastroenterology;  Laterality: N/A;   JOINT REPLACEMENT Bilateral    hip   REVERSE SHOULDER ARTHROPLASTY Right 05/11/2016   Procedure: REVERSE SHOULDER ARTHROPLASTY;  Surgeon: Teryl Lucy, MD;  Location: [facility] OR;  Service: Orthopedics;  Laterality: Right;   REVISION TOTAL HIP ARTHROPLASTY Left 10/03/2013   DR Turner Daniels   ROTATOR CUFF REPAIR     SCLEROTHERAPY  01/11/2022   Procedure: Susa Day;  Surgeon: Iva Boop, MD;  Location: [facility] ENDOSCOPY;  Service: Gastroenterology;;   TOTAL HIP REVISION Left 10/03/2013   Procedure: TOTAL HIP REVISION- left;  Surgeon: Nestor Lewandowsky, MD;  Location: [facility] OR;  Service: Orthopedics;  Laterality: Left;   TOTAL KNEE ARTHROPLASTY Left 11/29/2017   Procedure: LEFT TOTAL KNEE ARTHROPLASTY;  Surgeon: Teryl Lucy, MD;  Location: [facility] OR;  Service: Orthopedics;  Laterality: Left;   TOTAL SHOULDER ARTHROPLASTY Left 11/09/2016   Procedure: TOTAL REVERSE SHOULDER ARTHROPLASTY;  Surgeon: Teryl Lucy, MD;  Location: [facility] OR;  Service: Orthopedics;  Laterality: Left;   TOTAL SHOULDER REPLACEMENT Left 10/2016   TOTAL THYROIDECTOMY     UPPER GASTROINTESTINAL ENDOSCOPY     Social History:   reports that she has never smoked. She has never been exposed to tobacco smoke. She has never used smokeless tobacco. She reports that she does not drink alcohol and does not use drugs.  Allergies  Allergen Reactions   Macrobid [Nitrofurantoin] Other (See Comments)    Stomach pain    Family History  Problem Relation Age of Onset   Diabetes Mother    Congestive Heart Failure Mother    Lung cancer Father    Thrombosis Sister    CAD Brother        CABG   Colon cancer Neg Hx     Esophageal cancer Neg Hx    Stomach cancer Neg Hx    Rectal cancer Neg Hx     Prior to Admission medications   Medication Sig Start Date End Date Taking? Authorizing Provider  acetaminophen (TYLENOL) 500 MG tablet Take 1 tablet (500 mg total) by mouth every 6 (six) hours as needed for mild pain, headache or fever (or Fever >/= 101). 03/16/23  Yes Shon Hale, MD  acyclovir (ZOVIRAX) 400 MG tablet Take 1 tablet (400 mg total) by mouth 2 (two) times daily. 04/26/23  Yes Doreatha Massed, MD  Adalimumab (HUMIRA PEN) 40 MG/0.4ML PNKT Inject 40 mg into the skin every Saturday.   Yes [provider]  Aspirin 81 MG CAPS Take 1 capsule by mouth daily.   Yes [provider]  diclofenac Sodium (VOLTAREN) 1 % GEL APPLY (1) GRAM TO  AFFECTED AREA DAILY. Patient taking differently: Apply 1 g topically daily. Apply to knees 08/11/22  Yes [name], MD  ferrous sulfate 325 (65 FE) MG EC tablet Take 1 tablet (325 mg total) by mouth daily. 03/16/23  Yes Emokpae, Courage, MD  furosemide (LASIX) 20 MG tablet Take 1 tablet (20 mg total) by mouth 2 (two) times daily. Patient taking differently: Take 20 mg by mouth 2 (two) times daily. 03/16/22  Yes [name], MD  levothyroxine (SYNTHROID) 50 MCG tablet Take 1 tablet (50 mcg total) by mouth daily. 03/16/23 03/15/24 Yes Emokpae, Courage, MD  midodrine (PROAMATINE) 2.5 MG tablet Take 3 tablets (7.5 mg total) by mouth 3 (three) times daily with meals. 04/12/23 05/12/23 Yes Shah, Pratik D, DO  Multiple Vitamin (MULTIVITAMIN WITH MINERALS) TABS tablet Take 1 tablet by mouth every morning.   Yes [provider]  naloxone (NARCAN) nasal spray 4 mg/0.1 mL Place 1 spray into the nose as needed (opioid reversal).   Yes [provider]  Omega-3 Fatty Acids (FISH OIL) 1000 MG CAPS Take 1 capsule by mouth daily. 03/17/23  Yes [provider]  omeprazole (PRILOSEC) 40 MG capsule Take 1 capsule (40 mg total) by mouth daily  before breakfast. Open capsule and swallow granules with liquid or applesauce 04/15/22  Yes Iva Boop, MD  ondansetron (ZOFRAN) 4 MG tablet Take 4 mg by mouth every 6 (six) hours as needed for nausea or vomiting.   Yes [provider]  oxybutynin (DITROPAN-XL) 10 MG 24 hr tablet Take 10 mg by mouth at bedtime. 03/16/23  Yes [provider]  polyethylene glycol (MIRALAX / GLYCOLAX) 17 g packet Take 17 g by mouth daily. 04/13/23  Yes Shah, Pratik D, DO  potassium chloride SA (KLOR-CON M) 20 MEQ tablet Take 1 tablet (20 mEq total) by mouth daily. TAKE 1 TABLET (20 MEQ) BY MOUTH DAILY PRN with Lasix (EDEMA) 03/16/23  Yes Emokpae, Courage, MD  traMADol (ULTRAM) 50 MG tablet Take 50 mg by mouth every 8 (eight) hours as needed. 04/18/23  Yes [provider]  denosumab (PROLIA) 60 MG/ML SOSY injection inject 60mg  Subcutaneous every 6 months 180 days 10/07/22     denosumab (PROLIA) 60 MG/ML SOSY injection inject 60mg  Subcutaneous every 6 months 180 days 04/05/23     denosumab (PROLIA) 60 MG/ML SOSY injection 60mg  Subcutaneous every 6 months 180 days 04/06/23     denosumab (PROLIA) 60 MG/ML SOSY injection 60mg  Subcutaneous every 6 months 180 days 04/07/23     denosumab (PROLIA) 60 MG/ML SOSY injection 60mg  Subcutaneous every 6 months 180 days 04/11/23     dexamethasone (DECADRON) 4 MG tablet Take 5 tablets (20 mg total) by mouth once a week. Take the day after darzalex faspro. Take with breakfast. Patient not taking: Reported on 05/11/2023 04/26/23   Doreatha Massed, MD  lenalidomide (REVLIMID) 10 MG capsule Take 1 capsule (10 mg total) by mouth daily. Take for 21 days on, 7 days off Patient not taking: Reported on 05/11/2023 05/06/23   Doreatha Massed, MD  prochlorperazine (COMPAZINE) 10 MG tablet Take 1 tablet (10 mg total) by mouth every 6 (six) hours as needed for nausea or vomiting. Patient not taking: Reported on 05/11/2023 04/26/23   Doreatha Massed, MD   Vitals:   05/11/23  2130 05/11/23 2230 05/11/23 2348 05/12/23 0100  BP: 117/73 114/63 110/83 139/77  Pulse: 86 89 99 (!) 108  Resp: 18 (!) 24 (!) 27 (!) 25  Temp:  98.7 F (37.1 C)   TempSrc:   Oral   SpO2: 97% 95% 98% 100%   Physical Exam Vitals and nursing note reviewed.  Constitutional:      General: She is not in acute distress.    Appearance: She is ill-appearing.  HENT:     Head: Normocephalic and atraumatic.     Right Ear: Hearing normal.     Left Ear: Hearing normal.     Nose: Nose normal. No nasal deformity.     Mouth/Throat:     Lips: Pink.     Mouth: Mucous membranes are dry.     Tongue: No lesions.     Pharynx: Oropharynx is clear.  Eyes:     General: Lids are normal.     Extraocular Movements: Extraocular movements intact.     Pupils: Pupils are equal, round, and reactive to light.  Cardiovascular:     Rate and Rhythm: Regular rhythm. Tachycardia present.     Pulses: Normal pulses.     Heart sounds: Normal heart sounds.  Pulmonary:     Effort: Pulmonary effort is normal.     Breath sounds: Normal breath sounds.  Abdominal:     General: Bowel sounds are normal. There is no distension.     Palpations: Abdomen is soft. There is no mass.     Tenderness: There is no abdominal tenderness.  Musculoskeletal:     Right lower leg: No edema.     Left lower leg: No edema.  Skin:    General: Skin is warm.  Neurological:     General: No focal deficit present.     Mental Status: She is alert and oriented to person, place, and time.     Cranial Nerves: Cranial nerves 2-12 are intact.  Psychiatric:        Attention and Perception: Attention normal.        Mood and Affect: Mood normal.        Speech: Speech normal.        Behavior: Behavior normal. Behavior is cooperative.   Labs on Admission: I have personally reviewed following labs and imaging studies  CBC: Recent Labs  Lab 05/10/23 1117 05/11/23 2043  WBC 6.3 7.3  NEUTROABS 5.3 5.1  HGB 8.7* 10.3*  HCT 25.1* 29.3*  MCV  98.0 95.1  PLT 111* 82*   Basic Metabolic Panel: Recent Labs  Lab 05/10/23 1117 05/11/23 2114  NA 130* 128*  K 2.5* 2.5*  CL 93* 93*  CO2 25 21*  GLUCOSE 97 108*  BUN 40* 57*  CREATININE 3.27* 3.80*  CALCIUM 9.2 8.6*  MG 1.6*  --    GFR: Estimated Creatinine Clearance: 10 mL/min (A) (by C-G formula based on SCr of 3.8 mg/dL (H)). Liver Function Tests: Recent Labs  Lab 05/10/23 1117 05/11/23 2114  AST 19 20  ALT 13 14  ALKPHOS 134* 145*  BILITOT 0.8 0.9  PROT 8.9* 8.4*  ALBUMIN <1.5* <1.5*   Recent Labs  Lab 05/11/23 2114  LIPASE 17   No results for input(s): "AMMONIA" in the last 168 hours. Coagulation Profile: No results for input(s): "INR", "PROTIME" in the last 168 hours. Cardiac Enzymes: No results for input(s): "CKTOTAL", "CKMB", "CKMBINDEX", "TROPONINI" in the last 168 hours. BNP (last 3 results) No results for input(s): "PROBNP" in the last 8760 hours. HbA1C: No results for input(s): "HGBA1C" in the last 72 hours. CBG: No results for input(s): "GLUCAP" in the last 168 hours. Lipid Profile: No results for input(s): "CHOL", "  HDL", "LDLCALC", "TRIG", "CHOLHDL", "LDLDIRECT" in the last 72 hours. Thyroid Function Tests: No results for input(s): "TSH", "T4TOTAL", "FREET4", "T3FREE", "THYROIDAB" in the last 72 hours. Anemia Panel: No results for input(s): "VITAMINB12", "FOLATE", "FERRITIN", "TIBC", "IRON", "RETICCTPCT" in the last 72 hours. Urinalysis    Component Value Date/Time   COLORURINE AMBER (A) 05/11/2023 2012   APPEARANCEUR CLOUDY (A) 05/11/2023 2012   LABSPEC 1.005 05/11/2023 2012   PHURINE 7.0 05/11/2023 2012   GLUCOSEU NEGATIVE 05/11/2023 2012   HGBUR MODERATE (A) 05/11/2023 2012   BILIRUBINUR NEGATIVE 05/11/2023 2012   KETONESUR NEGATIVE 05/11/2023 2012   PROTEINUR 30 (A) 05/11/2023 2012   UROBILINOGEN 0.2 09/26/2013 1042   NITRITE NEGATIVE 05/11/2023 2012   LEUKOCYTESUR LARGE (A) 05/11/2023 2012   Unresulted Labs (From admission,  onward)     Start     Ordered   05/12/23 0500  Basic metabolic panel  Tomorrow morning,   R        05/12/23 0222   05/12/23 0500  Protime-INR  Tomorrow morning,   STAT        05/12/23 0229   05/12/23 0500  Cortisol-am, blood  Tomorrow morning,   URGENT        05/12/23 0229   05/12/23 0500  Procalcitonin  Tomorrow morning,   URGENT       References:    Procalcitonin Lower Respiratory Tract Infection AND Sepsis Procalcitonin Algorithm   05/12/23 0229   05/12/23 0221  Magnesium  Add-on,   AD        05/12/23 0220   05/11/23 2012  Urine Culture  Once,   R        05/11/23 2012   Unscheduled  Occult blood card to lab, stool  As needed,   URGENT      05/12/23 0222            Medications  pantoprazole (PROTONIX) injection 40 mg (has no administration in time range)  potassium chloride 10 mEq in 100 mL IVPB (has no administration in time range)  lactated ringers infusion (has no administration in time range)  sodium chloride flush (NS) 0.9 % injection 3 mL (has no administration in time range)  cefTRIAXone (ROCEPHIN) 2 g in sodium chloride 0.9 % 100 mL IVPB (has no administration in time range)  acetaminophen (TYLENOL) tablet 650 mg (has no administration in time range)    Or  acetaminophen (TYLENOL) suppository 650 mg (has no administration in time range)  sodium chloride 0.9 % bolus 1,000 mL (0 mLs Intravenous Stopped 05/11/23 2219)  sodium chloride 0.9 % bolus 1,000 mL (1,000 mLs Intravenous New Bag/Given 05/12/23 0029)  potassium chloride 10 mEq in 100 mL IVPB (10 mEq Intravenous New Bag/Given 05/12/23 0028)  potassium chloride (KLOR-CON) packet 60 mEq (60 mEq Oral Given 05/12/23 0036)    Radiological Exams on Admission: CT Renal Stone Study  Result Date: 05/12/2023 CLINICAL DATA:  Abdominal and flank pain EXAM: CT ABDOMEN AND PELVIS WITHOUT CONTRAST TECHNIQUE: Multidetector CT imaging of the abdomen and pelvis was performed following the standard protocol without IV contrast.  RADIATION DOSE REDUCTION: This exam was performed according to the departmental dose-optimization program which includes automated exposure control, adjustment of the mA and/or kV according to patient size and/or use of iterative reconstruction technique. COMPARISON:  04/06/2023 FINDINGS: Lower chest: No acute abnormality. Bibasilar atelectasis. Stable small pericardial effusion. Hepatobiliary: No focal liver abnormality is seen. Status post cholecystectomy. No biliary dilatation. Pancreas: Unremarkable Spleen: Unremarkable Adrenals/Urinary Tract: The adrenal glands  are unremarkable. The kidneys are normal in size and position. 7 mm nonobstructing calculus noted within the interpolar region of the left kidney. The kidneys are otherwise unremarkable. The bladder is obscured by streak artifact. Stomach/Bowel: Surgical changes of Roux-en-Y gastric bypass are identified. Moderate descending and sigmoid colonic diverticulosis without superimposed acute inflammatory change. Evaluation of pelvis is slightly limited by streak artifact. The stomach, small bowel, and large bowel are otherwise unremarkable. Appendix normal. No free intraperitoneal gas or fluid. Vascular/Lymphatic: Mild aortoiliac atherosclerotic calcification. No aortic aneurysm. No pathologic adenopathy within the abdomen and pelvis. Reproductive: Obscured by streak artifact Other: No abdominal wall hernia. Moderate diffuse subcutaneous body wall edema. 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 Osseous structures are diffusely osteopenic. 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 IMPRESSION: 1. No acute intra-abdominal pathology identified. No definite radiographic explanation for the patient's reported symptoms. 2. Mild left nonobstructing nephrolithiasis. No urolithiasis. No hydronephrosis. 3. Moderate distal colonic diverticulosis without  superimposed acute inflammatory change. 4. Stable small pericardial effusion. 5.  Aortic Atherosclerosis (ICD10-I70.0). Electronically Signed   By: Helyn Numbers M.D.   On: 05/12/2023 00:24   CT Head Wo Contrast  Result Date: 05/11/2023 CLINICAL DATA:  Altered mental status EXAM: CT HEAD WITHOUT CONTRAST TECHNIQUE: Contiguous axial images were obtained from the base of the skull through the vertex without intravenous contrast. RADIATION DOSE REDUCTION: This exam was performed according to the departmental dose-optimization program which includes automated exposure control, adjustment of the mA and/or kV according to patient size and/or use of iterative reconstruction technique. COMPARISON:  CT 03/11/2023 01/04/2023, MRI 03/11/2023 FINDINGS: Brain: No acute territorial infarction, hemorrhage or intracranial mass. The ventricles are nonenlarged. Mild chronic small vessel ischemic changes of the white matter Vascular: No hyperdense vessels.  Carotid vascular calcification Skull: No fracture. Heterogeneous lucencies throughout the skull base and calvarium with multiple small lucent lesions, similar compared with recent priors, increased compared to 2018 Sinuses/Orbits: No acute finding. Other: None IMPRESSION: 1. No CT evidence for acute intracranial abnormality. 2. Mild chronic small vessel ischemic changes of the white matter. 3. Heterogeneous lucencies throughout the skull base and calvarium with multiple small lucent lesions, similar compared with recent priors, increased compared to 2018. Correlate for any history myeloma or marrow disease. Electronically Signed   By: Jasmine Pang M.D.   On: 05/11/2023 22:37   DG Chest Portable 1 View  Result Date: 05/11/2023 CLINICAL DATA:  Weakness and confusion.  Altered mental status. EXAM: PORTABLE CHEST 1 VIEW COMPARISON:  Chest radiograph 04/06/2023, CT 03/11/2023 FINDINGS: Mild cardiomegaly. Stable mediastinal contours. Aortic atherosclerosis. Minimal ill-defined  opacity in the right infrahilar region, likely tortuous vessels when compared with prior CT. No acute airspace disease, large pleural effusion, or pneumothorax. 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 No change from prior exam. The left shoulder reverse arthroplasty appears intact. IMPRESSION: 1. Mild cardiomegaly. No acute intrathoracic abnormality. 2. 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 Recommend dedicated radiographs of the shoulder. Electronically Signed   By: Narda Rutherford M.D.   On: 05/11/2023 22:03    Data Reviewed: Relevant notes from primary care and specialist visits, past discharge summaries as available in EHR, including Care Everywhere. Prior diagnostic testing as pertinent to current admission diagnoses Updated medications and problem lists for reconciliation ED course, including vitals, labs, imaging, treatment  and response to treatment Triage notes, nursing and pharmacy notes and ED provider's notes Notable results as noted in HPI  Assessment and Plan: Essential hypertension Vitals:   05/11/23 1958 05/11/23 2100 05/11/23 2130 05/11/23 2230  BP: 135/78 124/74 117/73 114/63   05/11/23 2348 05/12/23 0100  BP: 110/83 139/77  Home diuretic Lasix. Continue with IV fluid hydration.   Thrombocytopenia (HCC) We will follow.  No bleeding.  Scd;s   Electrolyte abnormality Pharmacy consulted for hyponatremia, hypokalemia, history of hypomagnesemia will follow.  Sepsis secondary to UTI [facility]) Patient meets sepsis criteria from urinary tract infection. Will continue patient on her Rocephin and follow culture and sensitivity.   Multiple myeloma (HCC) Patient currently on chemotherapy regimen, which we have continued patient is on dexamethasone Revlimid.  Anemia    Latest Ref Rng & Units 05/11/2023     8:43 PM 05/10/2023   11:17 AM 05/03/2023    8:31 AM  CBC  WBC 4.0 - 10.5 K/uL 7.3  6.3  3.1   Hemoglobin 12.0 - 15.0 g/dL 82.9  8.7  7.7   Hematocrit 36.0 - 46.0 % 29.3  25.1  23.5   Platelets 150 - 400 K/uL 82  111  127   Type/ screen. IV PPI. Follow.  Stool guaiac.    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 No acute intra-abdominal pathology identified. No definite radiographic explanation for the patient's reported symptoms. 2. Mild left nonobstructing nephrolithiasis. No urolithiasis. No hydronephrosis. 3. Moderate distal colonic diverticulosis without superimposed acute inflammatory change. 4. Stable small pericardial effusion. 5.  Aortic Atherosclerosis    S/P shoulder replacement Imaging with dedicated shoulder x-rays or MRI per radiology.  Per a.m. team.  GERD (gastroesophageal reflux disease) Patient has a history of GERD, peptic ulcer disease, bleeding ulcer, GI bleed.  Will start patient on Protonix 40 mg twice daily.  Acquired hypothyroidism Continue patient on levothyroxine at 50 mcg/day.    DVT prophylaxis:  SCD's   Consults:  None   Advance Care Planning:    Code Status: Prior   Family Communication:  None   Disposition Plan:  TBD   Severity of Illness: The appropriate patient status for this patient is INPATIENT. Inpatient status is judged to be reasonable and necessary in order to provide the required intensity of service to ensure the patient's safety. The patient's presenting symptoms, physical exam findings, and initial radiographic and laboratory data in the context of their chronic comorbidities is felt to place them at high risk for further clinical deterioration. Furthermore, it is not anticipated that the patient will be medically stable for discharge from the hospital  within 2 midnights of admission.   * I certify that at the point of admission it is my clinical judgment that the patient will require inpatient hospital care spanning beyond 2 midnights from the point of admission due to high intensity of service, high risk for further deterioration and high frequency of surveillance required.*  Author: Gertha Calkin, MD 05/12/2023 2:29 AM  For on call review [email or web address].

## 2023-05-12 NOTE — Assessment & Plan Note (Signed)
Lab Results  Component Value Date   CREATININE 3.80 (H) 05/11/2023   CREATININE 3.27 (H) 05/10/2023   CREATININE 0.77 05/03/2023  1. No acute intra-abdominal pathology identified. No definite radiographic explanation for the patient's reported symptoms. 2. Mild left nonobstructing nephrolithiasis. No urolithiasis. No hydronephrosis. 3. Moderate distal colonic diverticulosis without superimposed acute inflammatory change. 4. Stable small pericardial effusion. 5.  Aortic Atherosclerosis

## 2023-05-12 NOTE — Assessment & Plan Note (Signed)
Patient currently on chemotherapy regimen, which we have continued patient is on dexamethasone Revlimid.

## 2023-05-12 NOTE — Assessment & Plan Note (Addendum)
Patient meets sepsis criteria from urinary tract infection. Will continue patient on her Rocephin and follow culture and sensitivity.  cefTRIAXone (ROCEPHIN)  IV Stopped (05/11/23 2139)   potassium chloride    LR at 50 cc/hour x once.

## 2023-05-12 NOTE — Assessment & Plan Note (Signed)
Patient has a history of GERD, peptic ulcer disease, bleeding ulcer, GI bleed.  Will start patient on Protonix 40 mg twice daily.

## 2023-05-12 NOTE — ED Notes (Addendum)
Spoke with [employer], pts family member and given update. All questions answered.

## 2023-05-12 NOTE — Assessment & Plan Note (Signed)
Pharmacy consulted for hyponatremia, hypokalemia, history of hypomagnesemia will follow.

## 2023-05-12 NOTE — Consult Note (Signed)
Consultation Note Date: 05/12/2023 at 0945  Patient Name: Kathy [name]  DOB: February 23, 1941  MRN: [id number]  Age / Sex: 82 y.o., female  PCP: [name], MD Referring Physician: Gertha Calkin, MD  HPI/Patient Profile: 82 y.o. female  with past medical history of Roux-en-Y bypass surgery, rheumatoid arthritis, left total hip replacement, left shoulder replacement, left knee total replacement,, anemia, thrombocytopenia, PUD, GERD, HTN, hypothyroidism, osteoporosis, obesity, OSA, history of pancreatitis, and multiple myeloma (active chemo) admitted on 05/11/2023 with AMS from LTC at [facility].  In the ED, CT of head was negative for acute abnormalities.  Patient found to have UTI.  Patient being treated with antibiotics (ceftriaxone).  Given patient's comorbidities and multiple recent hospitalizations, PMT was consulted to discuss goals of care.   Clinical Assessment and Goals of Care: Extensive chart review completed prior to meeting patient including labs, vital signs, imaging, progress notes, orders, and available advanced directive documents from current and previous encounters. I then met with patient at bedside in ED to discuss diagnosis prognosis, GOC, EOL wishes, disposition and options.  I introduced Palliative Medicine as specialized medical care for people living with serious illness. It focuses on providing relief from the symptoms and stress of a serious illness. The goal is to improve quality of life for both the patient and the family.  We discussed a brief life review of the patient.  Patient was never married, has no children, has 1 deceased sister, and 2 brothers who currently live in [place].  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  As far as functional and nutritional status patient endorses that she  feels fine but a little "off" today.  She denies issues with mobility and p.o. intake PTA.  She shares she began to feel little confused yesterday.  She says that she is starting to feel more like herself now.  She says sometimes her speech is garbled because of her dentures.  However, during our discussion, she was alert and oriented x 4.  We discussed patient's current illness and what it means in the larger context of patient's on-going co-morbidities.  Discussed treatment for multiple myeloma as well as acute treatment of UTI.  Reviewed that CT of head was without abnormalities.  Patient shares she has had a UTI in the past and remembers feeling similarly.  Symptoms assessed.  Patient denies acute complaints at this time.  No adjustment to [facility] needed.  Advance directives, concepts specific to code status, artificial feeding and hydration, and rehospitalization were considered and discussed.  In the event that patient is unable to speak for herself, she states that her brother Jonny Ruiz would be her surrogate decision maker.  Patient has not created an HCPOA or living will in the past.  She states that her brother Jonny Ruiz will make all of her decisions for her.  I attempted to discuss CODE STATUS and boundaries of care acceptable to patient.  She says she  wants "everything done".  However, she shares she does not want to talk about it at this time and trust that her brother Jonny Ruiz will make all the decisions for her.  I highlighted that patient is currently a full code and will remain so until she decides otherwise.  I specifically detailed that as it stands right now patient is accepting of all offered, available, and appropriate medical interventions to sustain her life.  She nodded her head yes in agreement.  Discussed with patient the importance of continued conversation with family and the medical providers regarding overall plan of care and treatment options, ensuring decisions are within the context of the  patient's values and GOCs.  She shares she trust her brother Jonny Ruiz to make decisions for her.  After meeting with the patient, I counseled with her dayshift RN.  No acute needs at this time.  With patient's permission, I attempted to speak with her brother Jonny Ruiz over the phone.  No answer.  HIPAA compliant voicemail with PMT contact info left for [name] to return my call.   Questions and concerns were addressed.  PMT will continue to follow and support patient throughout her hospitalization.  Primary Decision Maker PATIENT  Physical Exam Vitals reviewed.  Constitutional:      General: She is not in acute distress.    Appearance: She is normal weight.  HENT:     Head: Normocephalic.     Mouth/Throat:     Mouth: Mucous membranes are moist.  Eyes:     Pupils: Pupils are equal, round, and reactive to light.  Abdominal:     Palpations: Abdomen is soft.  Musculoskeletal:     Comments: MAETC, generalized weakness  Skin:    General: Skin is warm and dry.  Neurological:     Mental Status: She is alert and oriented to person, place, and time.  Psychiatric:        Mood and Affect: Mood normal.        Behavior: Behavior normal.        Thought Content: Thought content normal.        Judgment: Judgment normal.     Palliative Assessment/Data: 40%     Thank you for this consult. Palliative medicine will continue to follow and assist holistically.   Time Total: 75 minutes  Time spent includes: Detailed review of medical records (labs, imaging, vital signs), medically appropriate exam (mental status, respiratory, cardiac, skin), discussed with treatment team, counseling and educating patient, family and staff, documenting clinical information, medication management and coordination of care.  Signed by: Georgiann Cocker, DNP, FNP-BC Palliative Medicine   Please contact Palliative Medicine Team providers via [facility] for questions and concerns.

## 2023-05-12 NOTE — Assessment & Plan Note (Signed)
Imaging with dedicated shoulder x-rays or MRI per radiology.  Per a.m. team.

## 2023-05-12 NOTE — Progress Notes (Signed)
PHARMACY CONSULT NOTE - FOLLOW UP  Pharmacy Consult for Electrolyte Monitoring and Replacement   Recent Labs: Potassium (mmol/L)  Date Value  05/11/2023 2.5 (LL)   Magnesium (mg/dL)  Date Value  62/95/2841 1.6 (L)   Calcium (mg/dL)  Date Value  32/44/0102 8.6 (L)   Albumin (g/dL)  Date Value  72/53/6644 <1.5 (L)  03/18/2021 3.2 (L)   Phosphorus (mg/dL)  Date Value  03/47/4259 1.8 (L)   Sodium (mmol/L)  Date Value  05/11/2023 128 (L)  02/23/2023 135     Assessment: 10/9 @ 2114:  K = 2.5                        Ca = 8.6,  Alb = < 1.5,   Corrected Ca = 10.6  Goal of Therapy:  Electrolytes WNL   Plan:  KCl 60 mEq PO X 1 given on 10/10 @ 0036 KCl 10 mEq IV X 2 started on 10/09 @ 2215 - Will order KCl 10 mEq IV X 2 for 10/10 @ 0230.  - Will recheck electrolytes on 10/10 with AM labs  Chardonay Scritchfield D ,PharmD Clinical Pharmacist 05/12/2023 2:23 AM

## 2023-05-13 ENCOUNTER — Other Ambulatory Visit: Payer: Self-pay

## 2023-05-13 DIAGNOSIS — C9000 Multiple myeloma not having achieved remission: Secondary | ICD-10-CM

## 2023-05-13 DIAGNOSIS — N179 Acute kidney failure, unspecified: Secondary | ICD-10-CM | POA: Diagnosis not present

## 2023-05-13 DIAGNOSIS — N3090 Cystitis, unspecified without hematuria: Secondary | ICD-10-CM | POA: Diagnosis not present

## 2023-05-13 DIAGNOSIS — Z515 Encounter for palliative care: Secondary | ICD-10-CM | POA: Diagnosis not present

## 2023-05-13 LAB — CBC WITH DIFFERENTIAL/PLATELET
Abs Immature Granulocytes: 0.12 10*3/uL — ABNORMAL HIGH (ref 0.00–0.07)
Basophils Absolute: 0 10*3/uL (ref 0.0–0.1)
Basophils Relative: 1 %
Eosinophils Absolute: 0 10*3/uL (ref 0.0–0.5)
Eosinophils Relative: 0 %
HCT: 22.2 % — ABNORMAL LOW (ref 36.0–46.0)
Hemoglobin: 8 g/dL — ABNORMAL LOW (ref 12.0–15.0)
Immature Granulocytes: 2 %
Lymphocytes Relative: 7 %
Lymphs Abs: 0.5 10*3/uL — ABNORMAL LOW (ref 0.7–4.0)
MCH: 33.3 pg (ref 26.0–34.0)
MCHC: 36 g/dL (ref 30.0–36.0)
MCV: 92.5 fL (ref 80.0–100.0)
Monocytes Absolute: 0.2 10*3/uL (ref 0.1–1.0)
Monocytes Relative: 3 %
Neutro Abs: 5.8 10*3/uL (ref 1.7–7.7)
Neutrophils Relative %: 87 %
Platelets: 50 10*3/uL — ABNORMAL LOW (ref 150–400)
RBC: 2.4 MIL/uL — ABNORMAL LOW (ref 3.87–5.11)
RDW: 17.8 % — ABNORMAL HIGH (ref 11.5–15.5)
Smear Review: NORMAL
WBC: 6.6 10*3/uL (ref 4.0–10.5)
nRBC: 0.6 % — ABNORMAL HIGH (ref 0.0–0.2)

## 2023-05-13 LAB — BASIC METABOLIC PANEL
Anion gap: 13 (ref 5–15)
BUN: 70 mg/dL — ABNORMAL HIGH (ref 8–23)
CO2: 19 mmol/L — ABNORMAL LOW (ref 22–32)
Calcium: 8 mg/dL — ABNORMAL LOW (ref 8.9–10.3)
Chloride: 101 mmol/L (ref 98–111)
Creatinine, Ser: 3.87 mg/dL — ABNORMAL HIGH (ref 0.44–1.00)
GFR, Estimated: 11 mL/min — ABNORMAL LOW (ref >60)
Glucose, Bld: 92 mg/dL (ref 70–99)
Potassium: 3.6 mmol/L (ref 3.5–5.1)
Sodium: 133 mmol/L — ABNORMAL LOW (ref 135–145)

## 2023-05-13 LAB — CBC
HCT: 20.8 % — ABNORMAL LOW (ref 36.0–46.0)
Hemoglobin: 7.8 g/dL — ABNORMAL LOW (ref 12.0–15.0)
MCH: 33.5 pg (ref 26.0–34.0)
MCHC: 37.5 g/dL — ABNORMAL HIGH (ref 30.0–36.0)
MCV: 89.3 fL (ref 80.0–100.0)
Platelets: 51 10*3/uL — ABNORMAL LOW (ref 150–400)
RBC: 2.33 MIL/uL — ABNORMAL LOW (ref 3.87–5.11)
RDW: 17.5 % — ABNORMAL HIGH (ref 11.5–15.5)
WBC: 6.6 10*3/uL (ref 4.0–10.5)
nRBC: 0.5 % — ABNORMAL HIGH (ref 0.0–0.2)

## 2023-05-13 LAB — TECHNOLOGIST SMEAR REVIEW: Plt Morphology: NORMAL

## 2023-05-13 LAB — PHOSPHORUS: Phosphorus: <1 mg/dL — CL (ref 2.5–4.6)

## 2023-05-13 LAB — TSH: TSH: 13.805 u[IU]/mL — ABNORMAL HIGH (ref 0.350–4.500)

## 2023-05-13 LAB — MAGNESIUM: Magnesium: 1.9 mg/dL (ref 1.7–2.4)

## 2023-05-13 MED ORDER — LEVOTHYROXINE SODIUM 50 MCG PO TABS
50.0000 ug | ORAL_TABLET | Freq: Every day | ORAL | Status: DC
  Administered 2023-05-13 – 2023-05-14 (×2): 50 ug via ORAL
  Filled 2023-05-13 (×2): qty 1

## 2023-05-13 MED ORDER — SODIUM BICARBONATE 650 MG PO TABS
650.0000 mg | ORAL_TABLET | Freq: Every day | ORAL | Status: DC
  Administered 2023-05-13 – 2023-05-14 (×2): 650 mg via ORAL
  Filled 2023-05-13 (×2): qty 1

## 2023-05-13 MED ORDER — ORAL CARE MOUTH RINSE
15.0000 mL | OROMUCOSAL | Status: DC
  Administered 2023-05-13 – 2023-05-18 (×18): 15 mL via OROMUCOSAL

## 2023-05-13 MED ORDER — ORAL CARE MOUTH RINSE: 15.0000 mL | OROMUCOSAL | Status: DC | PRN

## 2023-05-13 NOTE — Evaluation (Signed)
Physical Therapy Evaluation Patient Details Name: Joory Alwood Minniear MRN: [id number] DOB: [birth year] Today's Date: 05/13/2023  History of Present Illness  Pt is a 82 y.o. female with a history of dementia, pancreatitis, Roux-en-Y gastric bypass, hypertension who was sent to the ED on 05/11/23 from her SNF due to increased confusion from her baseline. Unclear when this started but facility staff tonight think it has been going on for several days. Patient reports "I do not feel good" but is not able to elaborate.  Clinical Impression  Pt is received in bed, she is agreeable to PT session. At baseline, Pt reports short household distance with RW and assist from staff, requires assist with ADL/IADLs, and no recent falls. At beginning of session, Pt reports slight generalized discomfort adding she's "been in bed a lot". Pt performs bed mobility minA x2 and transfer minA x2 for safety. Pt demonstrates difficulty with scooting EOB requiring assist from PT. Pt able to perform SPT to recliner minA x2 for STS progressing to CGA x2 for steps-notable shuffling steps during SPT. Pt requires extended time to complete task due to slow processing and extended time to follow multi commands. Notifed RN regarding HR 129-132 bpm throughout session with a brief spike to 170 bpm (questionable accuracy). Pt would benefit from skilled PT to address above deficits and promote optimal return to PLOF.      If plan is discharge home, recommend the following: A little help with walking and/or transfers;A little help with bathing/dressing/bathroom;Assistance with cooking/housework;Assist for transportation;Help with stairs or ramp for entrance   Can travel by private vehicle   No    Equipment Recommendations None recommended by PT  Recommendations for Other Services       Functional Status Assessment Patient has had a recent decline in their functional status and demonstrates the ability to make significant improvements in  function in a reasonable and predictable amount of time.     Precautions / Restrictions Precautions Precautions: Fall Restrictions Weight Bearing Restrictions: No      Mobility  Bed Mobility Overal bed mobility: Needs Assistance Bed Mobility: Supine to Sit     Supine to sit: Min assist, +2 for physical assistance, Used rails, HOB elevated     General bed mobility comments: Pt requires min A x2 for completion of bed mobility and for trunk and BLE management; increased time to initiate task    Transfers Overall transfer level: Needs assistance Equipment used: Rolling walker (2 wheels) Transfers: Bed to chair/wheelchair/BSC     Step pivot transfers: +2 physical assistance, Min assist       General transfer comment: Pt able to perform SPT using RW requiring minA x2 for STS and able to progress to CGA x2 for stepping for safety; increased time to complete task and following multi commands; notable L knee extended during STS with difficult to flex by PT    Ambulation/Gait               General Gait Details: Deferred at this time due to general weakness  Stairs            Wheelchair Mobility     Tilt Bed    Modified Rankin (Stroke Patients Only)       Balance Overall balance assessment: Needs assistance Sitting-balance support: Feet supported, Single extremity supported Sitting balance-Leahy Scale: Good Sitting balance - Comments: able to maintain seated EOB balance during functional activities CGA for safety Postural control: Posterior lean Standing balance support: Bilateral upper extremity supported,  During functional activity, Reliant on assistive device for balance Standing balance-Leahy Scale: Fair Standing balance comment: Pt able to maintain static standing balance using RW; heavy reliance of AD                             Pertinent Vitals/Pain Pain Assessment Pain Assessment: Faces Faces Pain Scale: Hurts a little bit Pain  Location: generalized Pain Descriptors / Indicators: Aching Pain Intervention(s): Limited activity within patient's tolerance, Monitored during session, Repositioned    Home Living Family/patient expects to be discharged to:: Skilled nursing facility                   Additional Comments: Pt reports she was living alone and had a friend or family checking on her sometimes    Prior Function Prior Level of Function : Needs assist       Physical Assist : Mobility (physical);ADLs (physical) Mobility (physical): Bed mobility;Transfers;Gait ADLs (physical): Bathing;Dressing;IADLs Mobility Comments: Pt reports requiring assist from staff for STS and overall mobility; Pt reports amb with RW short household distances ADLs Comments: Assist from staff from IADLs and ADLs but able to feed herself     Extremity/Trunk Assessment   Upper Extremity Assessment Upper Extremity Assessment: Generalized weakness    Lower Extremity Assessment Lower Extremity Assessment: Generalized weakness       Communication   Communication Communication: No apparent difficulties Cueing Techniques: Verbal cues  Cognition Arousal: Alert Behavior During Therapy: WFL for tasks assessed/performed Overall Cognitive Status: Within Functional Limits for tasks assessed                                 General Comments: AO x4; increased time to respond to questions; very soft spoke and occasionally mumbles        General Comments General comments (skin integrity, edema, etc.): resting and EOB HR at 129-132 bpm and brief spike of 170 bpm during sitting recliner (questionable accuracy); Pt appeared good with no signs of distress or WOB    Exercises     Assessment/Plan    PT Assessment Patient needs continued PT services  PT Problem List Decreased strength;Decreased mobility;Decreased range of motion;Decreased activity tolerance;Decreased balance       PT Treatment Interventions DME  instruction;Therapeutic exercise;Gait training;Functional mobility training;Therapeutic activities    PT Goals (Current goals can be found in the Care Plan section)  Acute Rehab PT Goals Patient Stated Goal: to get better PT Goal Formulation: With patient Time For Goal Achievement: 05/27/23 Potential to Achieve Goals: Good    Frequency Min 1X/week     Co-evaluation               AM-PAC PT "6 Clicks" Mobility  Outcome Measure Help needed turning from your back to your side while in a flat bed without using bedrails?: A Little Help needed moving from lying on your back to sitting on the side of a flat bed without using bedrails?: A Little Help needed moving to and from a bed to a chair (including a wheelchair)?: A Little Help needed standing up from a chair using your arms (e.g., wheelchair or bedside chair)?: A Little Help needed to walk in hospital room?: A Lot Help needed climbing 3-5 steps with a railing? : Total 6 Click Score: 15    End of Session Equipment Utilized During Treatment: Gait belt Activity Tolerance: Patient limited by  fatigue;Patient tolerated treatment well Patient left: in chair;with call bell/phone within reach;with chair alarm set Nurse Communication: Mobility status PT Visit Diagnosis: Unsteadiness on feet (R26.81);Muscle weakness (generalized) (M62.81)    Time: 3086-5784 PT Time Calculation (min) (ACUTE ONLY): 28 min   Charges:   PT Evaluation $PT Eval Low Complexity: 1 Low PT Treatments $Therapeutic Activity: 8-22 mins PT General Charges $$ ACUTE PT VISIT: 1 Visit         Elmon Else, SPT   Deangleo Passage 05/13/2023, 12:25 PM

## 2023-05-13 NOTE — Consult Note (Signed)
[facility]  CONSULT NOTE    Date: 05/13/2023                  Patient Name:  Kathy [name]  MRN: [id number]  DOB: 1941-02-26  Age / Sex: 82 y.o., female         PCP: [name], MD                 Service Requesting Consult: Dr. Ashok Pall                 Reason for Consult: Acute Kidney Injury            History of Present Illness: Kathy [name] admitted to [facility] for acute kidney injury. Patient with baseline dementia and unable to give a history. History taken from chart. Patient recently diagnosed with multiple myeloma. Recently started on daratumumab and lanalidomide for her multiple myeloma.   No recent IV contrast exposure. Patient with no obstruction on imaging. Nonobstructive nephrolithiasis on CT.   Patient is taking furosemide as needed for lower extremity edema.   Urinalysis consitent with urinary tract infection. Started on IV antibiotics. Culture has grown E. Coli. Currently on ceftriaxone.    Medications: Outpatient medications: Medications Prior to Admission  Medication Sig Dispense Refill Last Dose   acetaminophen (TYLENOL) 500 MG tablet Take 1 tablet (500 mg total) by mouth every 6 (six) hours as needed for mild pain, headache or fever (or Fever >/= 101). 30 tablet 0 prn at prn   acyclovir (ZOVIRAX) 400 MG tablet Take 1 tablet (400 mg total) by mouth 2 (two) times daily. 60 tablet 11 Unknown at Unknown   Adalimumab (HUMIRA PEN) 40 MG/0.4ML PNKT Inject 40 mg into the skin every Saturday.   Unknown at Unknown   Aspirin 81 MG CAPS Take 1 capsule by mouth daily.   Unknown at Unknown   diclofenac Sodium (VOLTAREN) 1 % GEL APPLY (1) GRAM TO AFFECTED AREA DAILY. (Patient taking differently: Apply 1 g topically daily. Apply to knees) 100 g 0 Unknown at Unknown   ferrous sulfate 325 (65 FE) MG EC tablet Take 1 tablet (325 mg total) by mouth daily. 60 tablet 3 Unknown at Unknown   furosemide (LASIX) 20 MG tablet Take 1 tablet (20 mg  total) by mouth 2 (two) times daily. (Patient taking differently: Take 20 mg by mouth 2 (two) times daily.) 180 tablet 3 Unknown at Unknown   levothyroxine (SYNTHROID) 50 MCG tablet Take 1 tablet (50 mcg total) by mouth daily. 30 tablet 1 Unknown at Unknown   [EXPIRED] midodrine (PROAMATINE) 2.5 MG tablet Take 3 tablets (7.5 mg total) by mouth 3 (three) times daily with meals. 270 tablet 0 Unknown at Unknown   Multiple Vitamin (MULTIVITAMIN WITH MINERALS) TABS tablet Take 1 tablet by mouth every morning.   Unknown at Unknown   naloxone [facility]) nasal spray 4 mg/0.1 mL Place 1 spray into the nose as needed (opioid reversal).   prn at prn   Omega-3 Fatty Acids (FISH OIL) 1000 MG CAPS Take 1 capsule by mouth daily.   Unknown at Unknown   omeprazole (PRILOSEC) 40 MG capsule Take 1 capsule (40 mg total) by mouth daily before breakfast. Open capsule and swallow granules with liquid or applesauce 90 capsule 3 Unknown at Unknown   ondansetron (ZOFRAN) 4 MG tablet Take 4 mg by mouth every 6 (six) hours as needed for nausea or vomiting.   prn at prn   oxybutynin (  DITROPAN-XL) 10 MG 24 hr tablet Take 10 mg by mouth at bedtime.   Unknown at Unknown   polyethylene glycol (MIRALAX / GLYCOLAX) 17 g packet Take 17 g by mouth daily. 14 each 0 Unknown at Unknown   potassium chloride SA (KLOR-CON M) 20 MEQ tablet Take 1 tablet (20 mEq total) by mouth daily. TAKE 1 TABLET (20 MEQ) BY MOUTH DAILY PRN with Lasix (EDEMA) 90 tablet 3 Unknown at Unknown   traMADol (ULTRAM) 50 MG tablet Take 50 mg by mouth every 8 (eight) hours as needed.   prn at prn   denosumab (PROLIA) 60 MG/ML SOSY injection inject 60mg  Subcutaneous every 6 months 180 days 1 mL 1    denosumab (PROLIA) 60 MG/ML SOSY injection inject 60mg  Subcutaneous every 6 months 180 days 1 mL 1    denosumab (PROLIA) 60 MG/ML SOSY injection 60mg  Subcutaneous every 6 months 180 days 1 mL 1    denosumab (PROLIA) 60 MG/ML SOSY injection 60mg  Subcutaneous every 6 months 180  days 1 mL 1    denosumab (PROLIA) 60 MG/ML SOSY injection 60mg  Subcutaneous every 6 months 180 days 1 mL 1    dexamethasone (DECADRON) 4 MG tablet Take 5 tablets (20 mg total) by mouth once a week. Take the day after darzalex faspro. Take with breakfast. (Patient not taking: Reported on 05/11/2023) 20 tablet 11 Not Taking   lenalidomide (REVLIMID) 10 MG capsule Take 1 capsule (10 mg total) by mouth daily. Take for 21 days on, 7 days off (Patient not taking: Reported on 05/11/2023) 21 capsule 0 Not Taking   prochlorperazine (COMPAZINE) 10 MG tablet Take 1 tablet (10 mg total) by mouth every 6 (six) hours as needed for nausea or vomiting. (Patient not taking: Reported on 05/11/2023) 30 tablet 1 Not Taking    Current medications: Current Facility-Administered Medications  Medication Dose Route Frequency Provider Last Rate Last Admin   0.9 %  sodium chloride infusion   Intravenous Continuous Kathrynn Running, MD 100 mL/hr at 05/12/23 1541 Infusion Verify at 05/12/23 1541   acetaminophen (TYLENOL) tablet 650 mg  650 mg Oral Q6H PRN Gertha Calkin, MD       Or   acetaminophen (TYLENOL) suppository 650 mg  650 mg Rectal Q6H PRN Gertha Calkin, MD       acyclovir (ZOVIRAX) 200 MG capsule 400 mg  400 mg Oral BID Kathrynn Running, MD   400 mg at 05/13/23 1215   cefTRIAXone (ROCEPHIN) 1 g in sodium chloride 0.9 % 100 mL IVPB  1 g Intravenous Q24H Kathrynn Running, MD 200 mL/hr at 05/12/23 2304 1 g at 05/12/23 2304   feeding supplement (ENSURE ENLIVE / ENSURE PLUS) liquid 237 mL  237 mL Oral BID BM Gertha Calkin, MD   237 mL at 05/13/23 1003   levothyroxine (SYNTHROID) tablet 50 mcg  50 mcg Oral Q0600 Kathrynn Running, MD   50 mcg at 05/13/23 1215   midodrine (PROAMATINE) tablet 7.5 mg  7.5 mg Oral TID WC Wouk, Wilfred Curtis, MD   7.5 mg at 05/13/23 1215   pantoprazole (PROTONIX) EC tablet 40 mg  40 mg Oral Daily Kathrynn Running, MD   40 mg at 05/13/23 1002   polyethylene glycol (MIRALAX / GLYCOLAX)  packet 17 g  17 g Oral Daily Kathrynn Running, MD   17 g at 05/12/23 1034   sodium bicarbonate tablet 650 mg  650 mg Oral Daily Wouk, Wilfred Curtis, MD   [phone]  mg at 05/13/23 1215   sodium chloride flush (NS) 0.9 % injection 3 mL  3 mL Intravenous Q12H Gertha Calkin, MD   3 mL at 05/13/23 1002      Allergies: Allergies  Allergen Reactions   Macrobid [Nitrofurantoin] Other (See Comments)    Stomach pain      Past Medical History: Past Medical History:  Diagnosis Date   Abnormal findings on diagnostic imaging of heart and coronary circulation    Anemia    FROM BLEEDING ULCER   Anxiety    takes Alprazolam daily as needed   Arthritis    dx with RA 2017   Bariatric surgery status    Cataract    Diverticulosis    Gastro-esophageal reflux disease with esophagitis    Gastrojejunal ulcer with hemorrhage    Headache(784.0)    occasionally   History of blood transfusion    no abnormal reaction noted   History of bronchitis    last time many yrs ago   Hyperlipidemia    PT DENIES THIS DX -  ON NO MEDS AND NO ONE HAS TOLD HER   Hypertension    takes Amlodipine daily   Hypothyroidism    takes Synthroid daily   Insomnia    Joint pain    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   Localized edema    Nocturia    Numbness    occasionally left arm at night   Obesity    Osteoporosis    takes Fosamax weekly   Pain in joint involving pelvic region and thigh    Peripheral edema    takes Lasix daily as needed   PONV (postoperative nausea and vomiting)    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/2017   Rheumatoid arthritis, unspecified (HCC)    Rotator cuff arthropathy, 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   Sleep apnea    Phreesia 08/22/2020   Stomach ulcer    Thyroid disease    Phreesia 08/22/2020   Unspecified injury of muscle(s) and tendon(s) of the rotator cuff of left shoulder, subsequent encounter    Unspecified osteoarthritis, unspecified site    Wears glasses    Wears  partial dentures      Past Surgical History: Past Surgical History:  Procedure Laterality Date   ABDOMINAL HYSTERECTOMY     partial   cataract surgery Bilateral    CHOLECYSTECTOMY     COLONOSCOPY     ESOPHAGOGASTRODUODENOSCOPY (EGD) WITH PROPOFOL N/A 01/11/2022   Procedure: ESOPHAGOGASTRODUODENOSCOPY (EGD) WITH PROPOFOL;  Surgeon: Iva Boop, MD;  Location: [facility] ENDOSCOPY;  Service: Gastroenterology;  Laterality: N/A;   EYE SURGERY     CATARACTS BOTH   gastric bypass surgery     HOT HEMOSTASIS N/A 01/11/2022   Procedure: HOT HEMOSTASIS (ARGON PLASMA COAGULATION/BICAP);  Surgeon: Iva Boop, MD;  Location: [facility] ENDOSCOPY;  Service: Gastroenterology;  Laterality: N/A;   JOINT REPLACEMENT Bilateral    hip   REVERSE SHOULDER ARTHROPLASTY Right 05/11/2016   Procedure: REVERSE SHOULDER ARTHROPLASTY;  Surgeon: Teryl Lucy, MD;  Location: [facility] OR;  Service: Orthopedics;  Laterality: Right;   REVISION TOTAL HIP ARTHROPLASTY Left 10/03/2013   DR Turner Daniels   ROTATOR CUFF REPAIR     SCLEROTHERAPY  01/11/2022   Procedure: Susa Day;  Surgeon: Iva Boop, MD;  Location: [facility] ENDOSCOPY;  Service: Gastroenterology;;   TOTAL HIP REVISION Left 10/03/2013   Procedure: TOTAL HIP REVISION- left;  Surgeon: Nestor Lewandowsky, MD;  Location: [facility] OR;  Service: Orthopedics;  Laterality: Left;  TOTAL KNEE ARTHROPLASTY Left 11/29/2017   Procedure: LEFT TOTAL KNEE ARTHROPLASTY;  Surgeon: Teryl Lucy, MD;  Location: [facility] OR;  Service: Orthopedics;  Laterality: Left;   TOTAL SHOULDER ARTHROPLASTY Left 11/09/2016   Procedure: TOTAL REVERSE SHOULDER ARTHROPLASTY;  Surgeon: Teryl Lucy, MD;  Location: [facility] OR;  Service: Orthopedics;  Laterality: Left;   TOTAL SHOULDER REPLACEMENT Left 10/2016   TOTAL THYROIDECTOMY     UPPER GASTROINTESTINAL ENDOSCOPY       Family History: Family History  Problem Relation Age of Onset   Diabetes Mother    Congestive Heart Failure Mother    Lung cancer Father     Thrombosis Sister    CAD Brother        CABG   Colon cancer Neg Hx    Esophageal cancer Neg Hx    Stomach cancer Neg Hx    Rectal cancer Neg Hx      Social History: Social History   Socioeconomic History   Marital status: Divorced    Spouse name: Not on file   Number of children: Not on file   Years of education: Not on file   Highest education level: Not on file  Occupational History   Occupation: disabled  Tobacco Use   Smoking status: Never    Passive exposure: Never   Smokeless tobacco: Never  Vaping Use   Vaping status: Never Used  Substance and Sexual Activity   Alcohol use: No   Drug use: No   Sexual activity: Not Currently    Birth control/protection: Surgical  Other Topics Concern   Not on file  Social History Narrative   Not on file   Social Determinants of Health   Financial Resource Strain: Low Risk  (09/14/2021)   Overall Financial Resource Strain (CARDIA)    Difficulty of Paying Living Expenses: Not hard at all  Food Insecurity: Patient Unable To Answer (05/12/2023)   Hunger Vital Sign    Worried About Programme researcher, broadcasting/film/video in the Last Year: Patient unable to answer    Ran Out of Food in the Last Year: Patient unable to answer  Transportation Needs: Patient Unable To Answer (05/12/2023)   PRAPARE - Transportation    Lack of Transportation (Medical): Patient unable to answer    Lack of Transportation (Non-Medical): Patient unable to answer  Physical Activity: Sufficiently Active (09/14/2021)   Exercise Vital Sign    Days of Exercise per Week: 6 days    Minutes of Exercise per Session: 40 min  Stress: No Stress Concern Present (09/14/2021)   [employer] of Occupational Health - Occupational Stress Questionnaire    Feeling of Stress : Not at all  Social Connections: Moderately Isolated (09/14/2021)   Social Connection and Isolation Panel [NHANES]    Frequency of Communication with Friends and Family: More than three times a week    Frequency of  Social Gatherings with Friends and Family: More than three times a week    Attends Religious Services: More than 4 times per year    Active Member of [employer] or Organizations: No    Attends Banker Meetings: Never    Marital Status: Divorced  Catering manager Violence: Patient Unable To Answer (05/12/2023)   Humiliation, Afraid, Rape, and Kick questionnaire    Fear of Current or Ex-Partner: Patient unable to answer    Emotionally Abused: Patient unable to answer    Physically Abused: Patient unable to answer    Sexually Abused: Patient unable to answer  Vital Signs: Blood pressure 126/72, pulse 97, temperature 98.4 F (36.9 C), resp. rate 19, SpO2 100%.  Weight trends: There were no vitals filed for this visit.  Physical Exam: General: NAD, sitting in chair  Head: Normocephalic, atraumatic. Moist oral mucosal membranes  Eyes: Anicteric, PERRL  Neck: Supple, trachea midline  Lungs:  Clear to auscultation  Heart: Regular rate and rhythm  Abdomen:  Soft, nontender,   Extremities:  no peripheral edema.  Neurologic: Not alert or oriented  Skin: No lesions  Access: none     Lab results: Basic Metabolic Panel: Recent Labs  Lab 05/10/23 1117 05/11/23 2114 05/12/23 0422 05/13/23 0312  NA 130* 128* 130* 133*  K 2.5* 2.5* 4.4 3.6  CL 93* 93* 99 101  CO2 25 21* 19* 19*  GLUCOSE 97 108* 93 92  BUN 40* 57* 60* 70*  CREATININE 3.27* 3.80* 3.75* 3.87*  CALCIUM 9.2 8.6* 8.0* 8.0*  MG 1.6* 2.1  --  1.9  PHOS  --   --   --  <1.0*    Liver Function Tests: Recent Labs  Lab 05/10/23 1117 05/11/23 2114  AST 19 20  ALT 13 14  ALKPHOS 134* 145*  BILITOT 0.8 0.9  PROT 8.9* 8.4*  ALBUMIN <1.5* <1.5*   Recent Labs  Lab 05/11/23 2114  LIPASE 17   No results for input(s): "AMMONIA" in the last 168 hours.  CBC: Recent Labs  Lab 05/10/23 1117 05/11/23 2043 05/13/23 0312  WBC 6.3 7.3 6.6  6.6  NEUTROABS 5.3 5.1 5.8  HGB 8.7* 10.3* 8.0*  7.8*  HCT  25.1* 29.3* 22.2*  20.8*  MCV 98.0 95.1 92.5  89.3  PLT 111* 82* 50*  51*    Cardiac Enzymes: No results for input(s): "CKTOTAL", "CKMB", "CKMBINDEX", "TROPONINI" in the last 168 hours.  BNP: Invalid input(s): "POCBNP"  CBG: No results for input(s): "GLUCAP" in the last 168 hours.  Microbiology: Results for orders placed or performed during the hospital encounter of 05/11/23  Urine Culture     Status: Abnormal (Preliminary result)   Collection Time: 05/11/23  8:12 PM   Specimen: Urine, Random  Result Value Ref Range Status   Specimen Description   Final    URINE, RANDOM Performed at [facility], [address], [place], [place] [address]    Special Requests   Final    NONE Reflexed from [phone] Performed at [facility], [address] [place], [place] [address]    Culture >=100,000 COLONIES/mL ESCHERICHIA COLI (A)  Final   Report Status PENDING  Incomplete  Culture, blood (Routine X 2) w Reflex to ID Panel     Status: None (Preliminary result)   Collection Time: 05/12/23 10:38 AM   Specimen: BLOOD  Result Value Ref Range Status   Specimen Description BLOOD LEFT AC  Final   Special Requests   Final    BOTTLES DRAWN AEROBIC AND ANAEROBIC Blood Culture adequate volume   Culture   Final    NO GROWTH < 24 HOURS Performed at [facility], [address], [place], [place] [address]    Report Status PENDING  Incomplete  Culture, blood (Routine X 2) w Reflex to ID Panel     Status: None (Preliminary result)   Collection Time: 05/12/23 10:38 AM   Specimen: BLOOD  Result Value Ref Range Status   Specimen Description BLOOD RIGHT ARM  Final   Special Requests   Final    BOTTLES DRAWN AEROBIC AND ANAEROBIC Blood  Culture results may not be optimal due to an excessive volume of blood received in culture bottles   Culture   Final    NO GROWTH < 24 HOURS Performed at [facility], [address] [place], [place] [address]    Report  Status PENDING  Incomplete  MRSA Next Gen by PCR, Nasal     Status: None   Collection Time: 05/12/23  4:20 PM   Specimen: Nasal Mucosa; Nasal Swab  Result Value Ref Range Status   MRSA by PCR Next Gen NOT DETECTED NOT DETECTED Final    Comment: (NOTE) The GeneXpert MRSA Assay (FDA approved for NASAL specimens only), is one component of a comprehensive MRSA colonization surveillance program. It is not intended to diagnose MRSA infection nor to guide or monitor treatment for MRSA infections. Test performance is not FDA approved in patients less than 22 years old. Performed at [facility], [address] [place], [place] [address]     Coagulation Studies: Recent Labs    05/12/23 0527  LABPROT 14.6  INR 1.1    Urinalysis: Recent Labs    05/11/23 2012  COLORURINE AMBER*  LABSPEC 1.005  PHURINE 7.0  GLUCOSEU NEGATIVE  HGBUR MODERATE*  BILIRUBINUR NEGATIVE  KETONESUR NEGATIVE  PROTEINUR 30*  NITRITE NEGATIVE  LEUKOCYTESUR LARGE*      Imaging: CT Renal Stone Study  Result Date: 05/12/2023 CLINICAL DATA:  Abdominal and flank pain EXAM: CT ABDOMEN AND PELVIS WITHOUT CONTRAST TECHNIQUE: Multidetector CT imaging of the abdomen and pelvis was performed following the standard protocol without IV contrast. RADIATION DOSE REDUCTION: This exam was performed according to the departmental dose-optimization program which includes automated exposure control, adjustment of the mA and/or kV according to patient size and/or use of iterative reconstruction technique. COMPARISON:  04/06/2023 FINDINGS: Lower chest: No acute abnormality. Bibasilar atelectasis. Stable small pericardial effusion. Hepatobiliary: No focal liver abnormality is seen. Status post cholecystectomy. No biliary dilatation. Pancreas: Unremarkable Spleen: Unremarkable Adrenals/Urinary Tract: The adrenal glands are unremarkable. The kidneys are normal in size and position. 7 mm nonobstructing calculus noted within  the interpolar region of the left kidney. The kidneys are otherwise unremarkable. The bladder is obscured by streak artifact. Stomach/Bowel: Surgical changes of Roux-en-Y gastric bypass are identified. Moderate descending and sigmoid colonic diverticulosis without superimposed acute inflammatory change. Evaluation of pelvis is slightly limited by streak artifact. The stomach, small bowel, and large bowel are otherwise unremarkable. Appendix normal. No free intraperitoneal gas or fluid. Vascular/Lymphatic: Mild aortoiliac atherosclerotic calcification. No aortic aneurysm. No pathologic adenopathy within the abdomen and pelvis. Reproductive: Obscured by streak artifact Other: No abdominal wall hernia. Moderate diffuse subcutaneous body wall edema. 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 Osseous structures are diffusely osteopenic. 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 IMPRESSION: 1. No acute intra-abdominal pathology identified. No definite radiographic explanation for the patient's reported symptoms. 2. Mild left nonobstructing nephrolithiasis. No urolithiasis. No hydronephrosis. 3. Moderate distal colonic diverticulosis without superimposed acute inflammatory change. 4. Stable small pericardial effusion. 5.  Aortic Atherosclerosis (ICD10-I70.0). Electronically Signed   By: Helyn Numbers M.D.   On: 05/12/2023 00:24   CT Head Wo Contrast  Result Date: 05/11/2023 CLINICAL DATA:  Altered mental status EXAM: CT HEAD WITHOUT CONTRAST TECHNIQUE: Contiguous axial images were obtained from the base of the skull through the vertex without intravenous contrast. RADIATION DOSE REDUCTION: This exam was performed according to the departmental dose-optimization program which includes automated exposure  control, adjustment of the mA and/or kV according to patient size and/or use of iterative reconstruction  technique. COMPARISON:  CT 03/11/2023 01/04/2023, MRI 03/11/2023 FINDINGS: Brain: No acute territorial infarction, hemorrhage or intracranial mass. The ventricles are nonenlarged. Mild chronic small vessel ischemic changes of the white matter Vascular: No hyperdense vessels.  Carotid vascular calcification Skull: No fracture. Heterogeneous lucencies throughout the skull base and calvarium with multiple small lucent lesions, similar compared with recent priors, increased compared to 2018 Sinuses/Orbits: No acute finding. Other: None IMPRESSION: 1. No CT evidence for acute intracranial abnormality. 2. Mild chronic small vessel ischemic changes of the white matter. 3. Heterogeneous lucencies throughout the skull base and calvarium with multiple small lucent lesions, similar compared with recent priors, increased compared to 2018. Correlate for any history myeloma or marrow disease. Electronically Signed   By: Jasmine Pang M.D.   On: 05/11/2023 22:37   DG Chest Portable 1 View  Result Date: 05/11/2023 CLINICAL DATA:  Weakness and confusion.  Altered mental status. EXAM: PORTABLE CHEST 1 VIEW COMPARISON:  Chest radiograph 04/06/2023, CT 03/11/2023 FINDINGS: Mild cardiomegaly. Stable mediastinal contours. Aortic atherosclerosis. Minimal ill-defined opacity in the right infrahilar region, likely tortuous vessels when compared with prior CT. No acute airspace disease, large pleural effusion, or pneumothorax. 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 No change from prior exam. The left shoulder reverse arthroplasty appears intact. IMPRESSION: 1. Mild cardiomegaly. No acute intrathoracic abnormality. 2. 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 Recommend dedicated radiographs of the shoulder. Electronically Signed   By: Narda Rutherford M.D.   On:  05/11/2023 22:03     Assessment & Plan: Kathy [name] is a 82 y.o.  female with hypertension, hypothyroidism, peptic ulcer disease, rheumatoid arthritis, history of gastric surgery, and shoulder replacement who was admitted to [facility] on 05/11/2023 for AKI (acute kidney injury) (HCC) [N17.9] Cystitis [N30.90] AMS (altered mental status) [R41.82] 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, psychotic disturbance, mood disturbance, or anxiety, unspecified dementia type (HCC) [F03.C0]  Acute Kidney Injury: baseline creatinine of 0.77 on 05/03/23. Urine consistent with urinary tract infection.  - Continue IV fluids - not a candidate for dialysis. No indication for dialysis.  - holding furosemide.   Hyponatremia: improving with IV fluids  Acute metabolic acidosis: placed on oral sodium bicarbonate.   Anemia with acute kidney injury and Thrombocytopenia: drop in numbers  Multiple Myeloma - appreciate hematology input.   6.   Urinary tract infection: E. Coli  - ceftriaxone.   LOS: 1 Bernd Crom 10/11/202412:41 PM

## 2023-05-13 NOTE — Evaluation (Signed)
Occupational Therapy Evaluation Patient Details Name: Kathy [name] MRN: [id number] DOB: 1941-04-28 Today's Date: 05/13/2023   History of Present Illness Pt is a 82 y.o. female with a history of dementia, pancreatitis, Roux-en-Y gastric bypass, hypertension who was sent to the ED on 05/11/23 from her SNF due to increased confusion from her baseline. Unclear when this started but facility staff tonight think it has been going on for several days. Patient reports "I do not feel good" but is not able to elaborate.   Clinical Impression   Upon entering the room, pt seated in recliner chair and agreeable to OT intervention. Pt reports living at [facility] and staff assisting her with all self care from bed level. She does endorse ability to self feed. Pt states, "very little" when asked if she gets out of bed or ambulates. Attempts to stand from recliner chair with +2 assistance and pt unable to clear buttocks. Pt needing total A of 2 for squat pivot transfer from recliner chair >bed and then to return to supine. Pt also with tangential speech throughout and with increased confusion. She is oriented to self only but has history of dementia. Pt is likely close to baseline in regards to self care tasks based on her report, chart review, and staff reports. OT to complete orders at this time.       If plan is discharge home, recommend the following: Two people to help with walking and/or transfers;Two people to help with bathing/dressing/bathroom;Assistance with cooking/housework;Assist for transportation;Help with stairs or ramp for entrance;Direct supervision/assist for financial management;Direct supervision/assist for medications management;Supervision due to cognitive status    Functional Status Assessment  Patient has not had a recent decline in their functional status  Equipment Recommendations  None recommended by OT       Precautions / Restrictions Precautions Precautions:  Fall Restrictions Weight Bearing Restrictions: No      Mobility Bed Mobility Overal bed mobility: Needs Assistance Bed Mobility: Sit to Supine       Sit to supine: Max assist, Total assist, +2 for physical assistance        Transfers Overall transfer level: Needs assistance Equipment used: 2 person hand held assist Transfers: Bed to chair/wheelchair/BSC     Squat pivot transfers: Total assist, +2 physical assistance, +2 safety/equipment       General transfer comment: Attempted to stand from recliner chair with +2 assistance and pt was unable. Squat pivot transfer from chair >bed with total A of 2      Balance Overall balance assessment: Needs assistance Sitting-balance support: Feet supported, Single extremity supported Sitting balance-Leahy Scale: Fair                                     ADL either performed or assessed with clinical judgement   ADL Overall ADL's : At baseline                                       General ADL Comments: per pt report staff assist her at bed level at [facility]     Vision Patient Visual Report: No change from baseline              Pertinent Vitals/Pain Pain Assessment Pain Assessment: Faces Faces Pain Scale: Hurts a little bit Pain Location: generalized Pain Descriptors / Indicators:  Aching, Discomfort Pain Intervention(s): Limited activity within patient's tolerance, Monitored during session, Repositioned     Extremity/Trunk Assessment Upper Extremity Assessment Upper Extremity Assessment: Generalized weakness   Lower Extremity Assessment Lower Extremity Assessment: Generalized weakness       Communication Communication Communication: Difficulty communicating thoughts/reduced clarity of speech Cueing Techniques: Verbal cues;Tactile cues   Cognition Arousal: Alert Behavior During Therapy: WFL for tasks assessed/performed Overall Cognitive Status: Within Functional Limits  for tasks assessed                                 General Comments: Oriented to self only this session with very tangential speech and difficult to understand at times     General Comments  resting and EOB HR at 129-132 bpm and brief spike of 170 bpm during sitting recliner (questionable accuracy); Pt appeared good with no signs of distress or WOB            Home Living Family/patient expects to be discharged to:: Skilled nursing facility                                 Additional Comments: Pt reports living at [facility]      Prior Functioning/Environment Prior Level of Function : Needs assist       Physical Assist : Mobility (physical);ADLs (physical) Mobility (physical): Bed mobility;Transfers;Gait ADLs (physical): Bathing;Dressing;IADLs Mobility Comments: Pt reports she"hardly gets out of the bed at all" but if she does staff assist. ADLs Comments: Pt reports staff assist her from bed level with self care tasks but she is able to self feed at baseline                 OT Goals(Current goals can be found in the care plan section) Acute Rehab OT Goals Patient Stated Goal: to rest OT Goal Formulation: With patient Time For Goal Achievement: 05/13/23 Potential to Achieve Goals: Fair  OT Frequency:         AM-PAC OT "6 Clicks" Daily Activity     Outcome Measure Help from another person eating meals?: None Help from another person taking care of personal grooming?: A Little Help from another person toileting, which includes using toliet, bedpan, or urinal?: Total Help from another person bathing (including washing, rinsing, drying)?: A Lot Help from another person to put on and taking off regular upper body clothing?: A Lot Help from another person to put on and taking off regular lower body clothing?: Total 6 Click Score: 13   End of Session Nurse Communication: Mobility status  Activity Tolerance: Patient limited by  fatigue Patient left: in bed;with call bell/phone within reach;with bed alarm set                   Time: 9629-5284 OT Time Calculation (min): 21 min Charges:  OT General Charges $OT Visit: 1 Visit OT Evaluation $OT Eval Moderate Complexity: 1 Mod OT Treatments $Therapeutic Activity: 8-22 mins  Jackquline Denmark, MS, OTR/L , CBIS ascom [phone]  05/13/23, 2:51 PM

## 2023-05-13 NOTE — TOC Progression Note (Addendum)
Transition of Care [facility]) - Progression Note    Patient Details  Name: Kathy [name] MRN: [id number] Date of Birth: 12/19/1940  Transition [facility]) CM/SW Contact  Liliana Cline, LCSW Phone Number: 05/13/2023, 3:05 PM  Clinical Narrative:    Per Elmarie Shiley at [facility], the earliest patient could come back is Monday if medically ready. They do not have a nurse this weekend that could put in orders.   Expected Discharge Plan: Skilled Nursing Facility Barriers to Discharge: Continued Medical Work up  Expected Discharge Plan and Services     Post Acute Care Choice: Skilled Nursing Facility                                         Social Determinants of Health (SDOH) Interventions SDOH Screenings   Food Insecurity: Patient Unable To Answer (05/12/2023)  Housing: High Risk (05/12/2023)  Transportation Needs: Patient Unable To Answer (05/12/2023)  Utilities: Patient Unable To Answer (05/12/2023)  Alcohol Screen: Low Risk  (09/14/2021)  Depression (PHQ2-9): Low Risk  (03/30/2023)  Financial Resource Strain: Low Risk  (09/14/2021)  Physical Activity: Sufficiently Active (09/14/2021)  Social Connections: Moderately Isolated (09/14/2021)  Stress: No Stress Concern Present (09/14/2021)  Tobacco Use: Low Risk  (05/12/2023)    Readmission Risk Interventions    05/12/2023    1:07 PM 03/16/2023   10:32 AM  Readmission Risk Prevention Plan  Transportation Screening Complete Complete  PCP or Specialist Appt within 3-5 Days  Complete  HRI or Home Care Consult  Complete  Social Work Consult for Recovery Care Planning/Counseling  Complete  Palliative Care Screening  Not Applicable  Medication Review Oceanographer)  Complete  PCP or Specialist appointment within 3-5 days of discharge Complete   HRI or Home Care Consult Complete   SW Recovery Care/Counseling Consult Complete   Palliative Care Screening Not Applicable   Skilled Nursing Facility Complete

## 2023-05-13 NOTE — Progress Notes (Signed)
   05/12/23 1614  Assess: MEWS Score  Temp 98.3 F (36.8 C)  BP 132/70  MAP (mmHg) 88  Pulse Rate (!) 105  Resp (!) 21  SpO2 98 %  Assess: MEWS Score  MEWS Temp 0  MEWS Systolic 0  MEWS Pulse 1  MEWS RR 1  MEWS LOC 0  MEWS Score 2  MEWS Score Color Yellow  Assess: if the MEWS score is Yellow or Red  Were vital signs accurate and taken at a resting state? Yes  Does the patient meet 2 or more of the SIRS criteria? Yes  Does the patient have a confirmed or suspected source of infection? No  MEWS guidelines implemented  Yes, yellow  Treat  MEWS Interventions Considered administering scheduled or prn medications/treatments as ordered  Take Vital Signs  Increase Vital Sign Frequency  Yellow: Q2hr x1, continue Q4hrs until patient remains green for 12hrs  Escalate  MEWS: Escalate Yellow: Discuss with charge nurse and consider notifying provider and/or RRT  Notify: Charge Nurse/RN  Name of Charge Nurse/RN Notified Verl Bangs RN  Provider Notification  Provider Name/Title Shonna Chock MD  Date Provider Notified 05/12/23  Time Provider Notified 1628  Method of Notification Page  Notification Reason Other (Comment) (MEWS yellow)  Assess: SIRS CRITERIA  SIRS Temperature  0  SIRS Pulse 1  SIRS Respirations  1  SIRS WBC 0  SIRS Score Sum  2

## 2023-05-13 NOTE — TOC Progression Note (Addendum)
Transition of Care [facility]) - Progression Note    Patient Details  Name: Kathy [name] MRN: [id number] Date of Birth: 27-Nov-1940  Transition of Care [facility]) CM/SW Contact  Liliana Cline, LCSW Phone Number: 05/13/2023, 11:00 AM  Clinical Narrative:    Reached out to Tiffany at [facility] who confirmed patient is a long term care resident there. Tiffany states she will check if patient can come back this weekend if medically ready.  2:35- Reached out to Tiffany for update.    Expected Discharge Plan: Skilled Nursing Facility Barriers to Discharge: Continued Medical Work up  Expected Discharge Plan and Services     Post Acute Care Choice: Skilled Nursing Facility                                         Social Determinants of Health (SDOH) Interventions SDOH Screenings   Food Insecurity: Patient Unable To Answer (05/12/2023)  Housing: High Risk (05/12/2023)  Transportation Needs: Patient Unable To Answer (05/12/2023)  Utilities: Patient Unable To Answer (05/12/2023)  Alcohol Screen: Low Risk  (09/14/2021)  Depression (PHQ2-9): Low Risk  (03/30/2023)  Financial Resource Strain: Low Risk  (09/14/2021)  Physical Activity: Sufficiently Active (09/14/2021)  Social Connections: Moderately Isolated (09/14/2021)  Stress: No Stress Concern Present (09/14/2021)  Tobacco Use: Low Risk  (05/12/2023)    Readmission Risk Interventions    05/12/2023    1:07 PM 03/16/2023   10:32 AM  Readmission Risk Prevention Plan  Transportation Screening Complete Complete  PCP or Specialist Appt within 3-5 Days  Complete  HRI or Home Care Consult  Complete  Social Work Consult for Recovery Care Planning/Counseling  Complete  Palliative Care Screening  Not Applicable  Medication Review Oceanographer)  Complete  PCP or Specialist appointment within 3-5 days of discharge Complete   HRI or Home Care Consult Complete   SW Recovery Care/Counseling Consult Complete   Palliative Care  Screening Not Applicable   Skilled Nursing Facility Complete

## 2023-05-13 NOTE — Progress Notes (Signed)
PROGRESS NOTE    Kathy [name]  WUJ:[id number] DOB: 01-18-1941 DOA: 05/11/2023 PCP: [name], MD  Outpatient Specialists: oncology    Brief Narrative:   Kathy [name] is a 82 y.o. female with medical history significant for history of anemia, thrombocytopenia, currently on chemotherapy withDaratumumab SQ + Lenalidomide + Dexamethasone (DaraRd) q28d for multiple myeloma, peptic ulcer disease, GERD, hypertension, hypothyroidism, osteoporosis, obesity, sleep apnea, history of pancreatitis, patient is a limited historian and does not give HPI it is per chart review and per nursing note.  Per report patient states that she does not feel well.    Assessment & Plan:   Principal Problem:   AKI (acute kidney injury) (HCC) Active Problems:   Essential hypertension   Acquired hypothyroidism   GERD (gastroesophageal reflux disease)   S/P shoulder replacement   Rheumatoid arthritis (HCC)   History of Roux-en-Y gastric bypass   Anemia   Multiple myeloma (HCC)   Sepsis secondary to UTI (HCC)   Electrolyte abnormality   Thrombocytopenia (HCC)   AMS (altered mental status)  # Encephalopathy Likely 2/2 uti and aki and multiple myeloma. CT head neg, cxr nothing acute, no intra-abdominal acute pathology on CT of abdomen/pelvis, no signs liver dysfunction. Abx started prior to blood cultures. No report of seizure-like activity. Brother says mental status has been declining for last two months. - treat as below - monitor cultures  # Acute cystitis Very poor historian currently but urinalysis is suggestive of infection. Is immune compromised. Culture growing 10^5 e coli - cont ceftriaxone - f/u sensitivities  # Thrombocytopenia New, plts have trended down to 50s, suspect 2/2 recent MM diagnosis, did receive heparin little more than a month ago at that hospitalization - will reach out to patient's oncologist - monitor for now, hold heparin  # AKI # Metabolic  encephalopathy Baseline normal kidney function, here in the 3s where it has been for the last couple of days. CT stone protocol no signs obstruction. Cr has not improved with fluids - continue fluids - monitor for diarrhea/vomiting - hold home lasix - nephro consult today, quite possible her MM or its treatment is the culprit - start sodium bicarb  # Multiple myeloma Recent diagnosis, started on darzalex and dexamethasone - cont acyclovir  # Hx GI bleed 2/2 GJ ulcer. Hgb here stable from priors - monitor  # Hypothyroid - cont home synthroid - f/u tsh  # RA On weekly adalimumab at home  # Chronic hypotension - cont home midodrine  # End-of-life care Multiple admissions this year, poor functional capacity, multiple severe medical problems as above - prognosis is poor - palliative consult  DVT prophylaxis: heparin Code Status: full Family Communication: brother Jonny Ruiz updated telephonically 10/10  Level of care: Med-Surg Status is: Inpatient Remains inpatient appropriate because: severity of illness    Consultants:  nephrology  Procedures: none  Antimicrobials:  ceftriaxone    Subjective: Confused, no complaints, eating breakfast  Objective: Vitals:   05/12/23 2357 05/13/23 0357 05/13/23 0749 05/13/23 0820  BP: 132/72 117/76 126/71 126/72  Pulse: (!) 103 94  97  Resp: 16 16 17 19   Temp: 98.2 F (36.8 C) 97.8 F (36.6 C) 97.8 F (36.6 C) 98.4 F (36.9 C)  TempSrc: Oral Oral Oral   SpO2: 100% 99% 99% 100%    Intake/Output Summary (Last 24 hours) at 05/13/2023 1134 Last data filed at 05/12/2023 1541 Gross per 24 hour  Intake 400 ml  Output --  Net 400 ml  There were no vitals filed for this visit.  Examination:  General exam: Appears calm and comfortable  Respiratory system: Clear to auscultation. Respiratory effort normal. Cardiovascular system: S1 & S2 heard, RRR. No JVD, murmurs, rubs, gallops or clicks. No pedal edema. Gastrointestinal  system: Abdomen is nondistended, soft and nontender. No organomegaly or masses felt.   Central nervous system: Alert and oriented to self, moving all 4 Extremities: Symmetric 5 x 5 power. Skin: No rashes, lesions or ulcers Psychiatry: calm and confused    Data Reviewed: I have personally reviewed following labs and imaging studies  CBC: Recent Labs  Lab 05/10/23 1117 05/11/23 2043 05/13/23 0312  WBC 6.3 7.3 6.6  NEUTROABS 5.3 5.1  --   HGB 8.7* 10.3* 7.8*  HCT 25.1* 29.3* 20.8*  MCV 98.0 95.1 89.3  PLT 111* 82* 51*   Basic Metabolic Panel: Recent Labs  Lab 05/10/23 1117 05/11/23 2114 05/12/23 0422 05/13/23 0312  NA 130* 128* 130* 133*  K 2.5* 2.5* 4.4 3.6  CL 93* 93* 99 101  CO2 25 21* 19* 19*  GLUCOSE 97 108* 93 92  BUN 40* 57* 60* 70*  CREATININE 3.27* 3.80* 3.75* 3.87*  CALCIUM 9.2 8.6* 8.0* 8.0*  MG 1.6* 2.1  --  1.9  PHOS  --   --   --  <1.0*   GFR: Estimated Creatinine Clearance: 9.8 mL/min (A) (by C-G formula based on SCr of 3.87 mg/dL (H)). Liver Function Tests: Recent Labs  Lab 05/10/23 1117 05/11/23 2114  AST 19 20  ALT 13 14  ALKPHOS 134* 145*  BILITOT 0.8 0.9  PROT 8.9* 8.4*  ALBUMIN <1.5* <1.5*   Recent Labs  Lab 05/11/23 2114  LIPASE 17   No results for input(s): "AMMONIA" in the last 168 hours. Coagulation Profile: Recent Labs  Lab 05/12/23 0527  INR 1.1   Cardiac Enzymes: No results for input(s): "CKTOTAL", "CKMB", "CKMBINDEX", "TROPONINI" in the last 168 hours. BNP (last 3 results) No results for input(s): "PROBNP" in the last 8760 hours. HbA1C: No results for input(s): "HGBA1C" in the last 72 hours. CBG: No results for input(s): "GLUCAP" in the last 168 hours. Lipid Profile: No results for input(s): "CHOL", "HDL", "LDLCALC", "TRIG", "CHOLHDL", "LDLDIRECT" in the last 72 hours. Thyroid Function Tests: Recent Labs    05/12/23 0422 05/12/23 1038  TSH  --  13.889*  FREET4 0.70  --    Anemia Panel: No results for  input(s): "VITAMINB12", "FOLATE", "FERRITIN", "TIBC", "IRON", "RETICCTPCT" in the last 72 hours. Urine analysis:    Component Value Date/Time   COLORURINE AMBER (A) 05/11/2023 2012   APPEARANCEUR CLOUDY (A) 05/11/2023 2012   LABSPEC 1.005 05/11/2023 2012   PHURINE 7.0 05/11/2023 2012   GLUCOSEU NEGATIVE 05/11/2023 2012   HGBUR MODERATE (A) 05/11/2023 2012   BILIRUBINUR NEGATIVE 05/11/2023 2012   KETONESUR NEGATIVE 05/11/2023 2012   PROTEINUR 30 (A) 05/11/2023 2012   UROBILINOGEN 0.2 09/26/2013 1042   NITRITE NEGATIVE 05/11/2023 2012   LEUKOCYTESUR LARGE (A) 05/11/2023 2012   Sepsis Labs: @LABRCNTIP (procalcitonin:4,lacticidven:4)  ) Recent Results (from the past 240 hour(s))  Urine Culture     Status: Abnormal (Preliminary result)   Collection Time: 05/11/23  8:12 PM   Specimen: Urine, Random  Result Value Ref Range Status   Specimen Description   Final    URINE, RANDOM Performed at [facility], [address] [place], [place] [address]    Special Requests   Final    NONE Reflexed from [id number] Performed  at [facility], [address] [place], [place] [address]    Culture >=100,000 COLONIES/mL ESCHERICHIA COLI (A)  Final   Report Status PENDING  Incomplete  Culture, blood (Routine X 2) w Reflex to ID Panel     Status: None (Preliminary result)   Collection Time: 05/12/23 10:38 AM   Specimen: BLOOD  Result Value Ref Range Status   Specimen Description BLOOD LEFT AC  Final   Special Requests   Final    BOTTLES DRAWN AEROBIC AND ANAEROBIC Blood Culture adequate volume   Culture   Final    NO GROWTH < 24 HOURS Performed at [facility], [address] [place], [place] [address]    Report Status PENDING  Incomplete  Culture, blood (Routine X 2) w Reflex to ID Panel     Status: None (Preliminary result)   Collection Time: 05/12/23 10:38 AM   Specimen: BLOOD  Result Value Ref Range Status   Specimen Description BLOOD RIGHT ARM  Final    Special Requests   Final    BOTTLES DRAWN AEROBIC AND ANAEROBIC Blood Culture results may not be optimal due to an excessive volume of blood received in culture bottles   Culture   Final    NO GROWTH < 24 HOURS Performed at [facility], [address] [place], [place] [address]    Report Status PENDING  Incomplete  MRSA Next Gen by PCR, Nasal     Status: None   Collection Time: 05/12/23  4:20 PM   Specimen: Nasal Mucosa; Nasal Swab  Result Value Ref Range Status   MRSA by PCR Next Gen NOT DETECTED NOT DETECTED Final    Comment: (NOTE) The GeneXpert MRSA Assay (FDA approved for NASAL specimens only), is one component of a comprehensive MRSA colonization surveillance program. It is not intended to diagnose MRSA infection nor to guide or monitor treatment for MRSA infections. Test performance is not FDA approved in patients less than 37 years old. Performed at [facility], [address] [place], [place] [address]          Radiology Studies: CT Renal Stone Study  Result Date: 05/12/2023 CLINICAL DATA:  Abdominal and flank pain EXAM: CT ABDOMEN AND PELVIS WITHOUT CONTRAST TECHNIQUE: Multidetector CT imaging of the abdomen and pelvis was performed following the standard protocol without IV contrast. RADIATION DOSE REDUCTION: This exam was performed according to the departmental dose-optimization program which includes automated exposure control, adjustment of the mA and/or kV according to patient size and/or use of iterative reconstruction technique. COMPARISON:  04/06/2023 FINDINGS: Lower chest: No acute abnormality. Bibasilar atelectasis. Stable small pericardial effusion. Hepatobiliary: No focal liver abnormality is seen. Status post cholecystectomy. No biliary dilatation. Pancreas: Unremarkable Spleen: Unremarkable Adrenals/Urinary Tract: The adrenal glands are unremarkable. The kidneys are normal in size and position. 7 mm nonobstructing calculus noted within  the interpolar region of the left kidney. The kidneys are otherwise unremarkable. The bladder is obscured by streak artifact. Stomach/Bowel: Surgical changes of Roux-en-Y gastric bypass are identified. Moderate descending and sigmoid colonic diverticulosis without superimposed acute inflammatory change. Evaluation of pelvis is slightly limited by streak artifact. The stomach, small bowel, and large bowel are otherwise unremarkable. Appendix normal. No free intraperitoneal gas or fluid. Vascular/Lymphatic: Mild aortoiliac atherosclerotic calcification. No aortic aneurysm. No pathologic adenopathy within the abdomen and pelvis. Reproductive: Obscured by streak artifact Other: No abdominal wall hernia. Moderate diffuse subcutaneous body wall edema. 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 Osseous structures  are diffusely osteopenic. 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 IMPRESSION: 1. No acute intra-abdominal pathology identified. No definite radiographic explanation for the patient's reported symptoms. 2. Mild left nonobstructing nephrolithiasis. No urolithiasis. No hydronephrosis. 3. Moderate distal colonic diverticulosis without superimposed acute inflammatory change. 4. Stable small pericardial effusion. 5.  Aortic Atherosclerosis (ICD10-I70.0). Electronically Signed   By: Helyn Numbers M.D.   On: 05/12/2023 00:24   CT Head Wo Contrast  Result Date: 05/11/2023 CLINICAL DATA:  Altered mental status EXAM: CT HEAD WITHOUT CONTRAST TECHNIQUE: Contiguous axial images were obtained from the base of the skull through the vertex without intravenous contrast. RADIATION DOSE REDUCTION: This exam was performed according to the departmental dose-optimization program which includes automated exposure control, adjustment of the mA and/or kV according to patient size and/or use of iterative reconstruction  technique. COMPARISON:  CT 03/11/2023 01/04/2023, MRI 03/11/2023 FINDINGS: Brain: No acute territorial infarction, hemorrhage or intracranial mass. The ventricles are nonenlarged. Mild chronic small vessel ischemic changes of the white matter Vascular: No hyperdense vessels.  Carotid vascular calcification Skull: No fracture. Heterogeneous lucencies throughout the skull base and calvarium with multiple small lucent lesions, similar compared with recent priors, increased compared to 2018 Sinuses/Orbits: No acute finding. Other: None IMPRESSION: 1. No CT evidence for acute intracranial abnormality. 2. Mild chronic small vessel ischemic changes of the white matter. 3. Heterogeneous lucencies throughout the skull base and calvarium with multiple small lucent lesions, similar compared with recent priors, increased compared to 2018. Correlate for any history myeloma or marrow disease. Electronically Signed   By: Jasmine Pang M.D.   On: 05/11/2023 22:37   DG Chest Portable 1 View  Result Date: 05/11/2023 CLINICAL DATA:  Weakness and confusion.  Altered mental status. EXAM: PORTABLE CHEST 1 VIEW COMPARISON:  Chest radiograph 04/06/2023, CT 03/11/2023 FINDINGS: Mild cardiomegaly. Stable mediastinal contours. Aortic atherosclerosis. Minimal ill-defined opacity in the right infrahilar region, likely tortuous vessels when compared with prior CT. No acute airspace disease, large pleural effusion, or pneumothorax. 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 No change from prior exam. The left shoulder reverse arthroplasty appears intact. IMPRESSION: 1. Mild cardiomegaly. No acute intrathoracic abnormality. 2. 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 Recommend dedicated radiographs of the shoulder. Electronically Signed   By: Narda Rutherford M.D.   On:  05/11/2023 22:03        Scheduled Meds:  acyclovir  400 mg Oral BID   feeding supplement  237 mL Oral BID BM   levothyroxine  50 mcg Oral Q0600   midodrine  7.5 mg Oral TID WC   pantoprazole  40 mg Oral Daily   polyethylene glycol  17 g Oral Daily   sodium chloride flush  3 mL Intravenous Q12H   Continuous Infusions:  sodium chloride 100 mL/hr at 05/12/23 1541   cefTRIAXone (ROCEPHIN)  IV 1 g (05/12/23 2304)     LOS: 1 day     Silvano Bilis, MD [facility]   If 7PM-7AM, please contact night-coverage [email or web address] Password [facility] 05/13/2023, 11:34 AM

## 2023-05-13 NOTE — Progress Notes (Signed)
PHARMACY CONSULT NOTE - ELECTROLYTES  Pharmacy Consult for Electrolyte Monitoring and Replacement   Recent Labs:   Estimated Creatinine Clearance: 9.8 mL/min (A) (by C-G formula based on SCr of 3.87 mg/dL (H)). Potassium (mmol/L)  Date Value  05/13/2023 3.6   Magnesium (mg/dL)  Date Value  16/05/9603 1.9   Calcium (mg/dL)  Date Value  54/04/8118 8.0 (L)   Albumin (g/dL)  Date Value  14/78/2956 <1.5 (L)  03/18/2021 3.2 (L)   Phosphorus (mg/dL)  Date Value  21/30/8657 1.8 (L)   Sodium (mmol/L)  Date Value  05/13/2023 133 (L)  02/23/2023 135   Corrected Ca: 10.0 mg/dL  Assessment  Nahomi Hegner Mayeux is a 82 y.o. female with a past medical history significant for anemia, thrombocytopenia, currently on chemotherapy withDaratumumab SQ + Lenalidomide + Dexamethasone (DaraRd) q28d for multiple myeloma, peptic ulcer disease, GERD, hypertension, hypothyroidism, osteoporosis, obesity, sleep apnea, history of pancreatitis . Pharmacy has been consulted to monitor and replace electrolytes.  Diet: PO, DYS 3 MIVF: NS @ 100 mL/hr Pertinent medications: None  Goal of Therapy: Electrolytes WNL  Plan:  No supplementation is needed at this time Check BMP with AM labs  Thank you for allowing pharmacy to be a part of this patient's care.  Merryl Hacker, PharmD Clinical Pharmacist 05/13/2023 7:11 AM

## 2023-05-13 NOTE — Plan of Care (Signed)
  Problem: Fluid Volume: Goal: Hemodynamic stability will improve Outcome: Progressing   Problem: Clinical Measurements: Goal: Diagnostic test results will improve Outcome: Progressing Goal: Signs and symptoms of infection will decrease Outcome: Progressing   Problem: Respiratory: Goal: Ability to maintain adequate ventilation will improve Outcome: Progressing   Problem: Education: Goal: Knowledge of General Education information will improve Description: Including pain rating scale, medication(s)/side effects and non-pharmacologic comfort measures Outcome: Progressing   Problem: Urinary Elimination: Goal: Signs and symptoms of infection will decrease Outcome: Progressing   

## 2023-05-13 NOTE — Progress Notes (Signed)
Palliative Care Progress Note, Assessment & Plan   Patient Name: Kathy [name]       Date: 05/13/2023 DOB: May 08, 1941  Age: 82 y.o. MRN#: [id number] Attending Physician: Kathrynn Running, MD Primary Care Physician: [name], MD Admit Date: 05/11/2023  Subjective: Patient is OOB to recliner. She is asleep with her breakfast tray in front of her, seemingly untouched. She awakens easily and acknowledges my presence. No family or friends present at bedside.   HPI: 82 y.o. female  with past medical history of Roux-en-Y bypass surgery, rheumatoid arthritis, left total hip replacement, left shoulder replacement, left knee total replacement,, anemia, thrombocytopenia, PUD, GERD, HTN, hypothyroidism, osteoporosis, obesity, OSA, history of pancreatitis, and multiple myeloma (active chemo) admitted on 05/11/2023 with AMS from LTC at [facility].   In the ED, CT of head was negative for acute abnormalities.  Patient found to have UTI.  Patient being treated with antibiotics (ceftriaxone).   Given patient's comorbidities and multiple recent hospitalizations, PMT was consulted to discuss goals of care.  Summary of counseling/coordination of care: Extensive chart review completed prior to meeting patient including labs, vital signs, imaging, progress notes, orders, and available advanced directive documents from current and previous encounters.   After reviewing the patient's chart and assessing the patient at bedside, patient is unable to participate in goals of care complex medical decision making at this time.  She is able to share her name and where she is, but cannot recall her current medical status or plan of care.  Symptoms assessed.  Patient denies chest pain, headache, shortness of breath, or  other acute illnesses at this time.  No adjustment to [facility] needed at this time.  After meeting with the patient, I attempted to speak with the patient's brother Jonny Ruiz over the phone.  No answer.  HIPAA compliant voicemail left again with PMT contact info.  PMT remains available to patient and family throughout her hospitalization.  We will continue to follow and await callback from patient's brother to continue goals of care discussions.  Physical Exam Vitals reviewed.  Constitutional:      General: She is not in acute distress.    Appearance: She is normal weight.  HENT:     Head: Normocephalic.     Mouth/Throat:     Mouth: Mucous membranes are moist.     Comments: Dentures in place Eyes:     Pupils: Pupils are equal, round, and reactive to light.  Cardiovascular:     Rate and Rhythm: Normal rate.     Pulses: Normal pulses.  Musculoskeletal:     Comments: Generalized weakness  Skin:    General: Skin is warm and dry.  Neurological:     Mental Status: She is alert.     Comments: Oriented to self and place  Psychiatric:        Mood and Affect: Mood normal.        Behavior: Behavior normal.        Thought Content: Thought content normal.        Judgment: Judgment normal.             Total Time 25 minutes   Time spent includes: Detailed  review of medical records (labs, imaging, vital signs), medically appropriate exam (mental status, respiratory, cardiac, skin), discussed with treatment team, counseling and educating patient, family and staff, documenting clinical information, medication management and coordination of care.  Samara Deist L. Bonita Quin, DNP, FNP-BC Palliative Medicine Team

## 2023-05-13 NOTE — [place] FL2 (Signed)
[place] MEDICAID FL2 LEVEL OF CARE FORM     IDENTIFICATION  Patient Name: Kathy [name] Birthdate: 1941/05/06 Sex: female Admission Date (Current Location): 05/11/2023  [place] and [place] Number:  Chiropodist and Address:  [facility], [address], [place], [place] [address]      Provider Number: [id number]  Attending Physician Name and Address:  Kathrynn Running, MD  Relative Name and Phone Number:  Julian, Mullet (Brother)  [phone] (Mobile)    Current Level of Care: Hospital Recommended Level of Care: Skilled Nursing Facility Prior Approval Number:    Date Approved/Denied:   PASRR Number: [id number]  Discharge Plan:      Current Diagnoses: Patient Active Problem List   Diagnosis Date Noted   Sepsis secondary to UTI (HCC) 05/12/2023   Electrolyte abnormality 05/12/2023   Thrombocytopenia (HCC) 05/12/2023   AMS (altered mental status) 05/12/2023   Multiple myeloma (HCC) 04/14/2023   Hypercalcemia of malignancy 04/07/2023   General weakness 04/07/2023   Recurrent falls 04/07/2023   Hypotension 04/07/2023   Hypoalbuminemia due to protein-calorie malnutrition (HCC) 04/07/2023   Acute prostatitis 04/07/2023   Chronic kidney disease, stage 3b (HCC) 04/07/2023   Dysphagia 04/07/2023   Acute pancreatitis 04/07/2023   History of thyroid cancer 04/07/2023   Anemia 03/30/2023   Physical deconditioning 03/30/2023   Elevated serum immunoglobulin free light chain level 03/30/2023   Leg swelling 02/21/2023   Hospital discharge follow-up 02/21/2023   Hypomagnesemia 02/21/2023   Urge incontinence of urine 02/21/2023   Hypercalcemia 01/23/2023   AKI (acute kidney injury) (HCC) 01/23/2023   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   Hammer toe of left foot 09/15/2022   Left foot pain 09/15/2022   Iron deficiency anemia due to chronic blood loss 07/19/2022   Rectal bleeding 01/21/2022   Gastrojejunal ulcer  with hemorrhage    Encounter for general adult medical examination with abnormal findings 09/16/2021   Hypokalemia 09/16/2021   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   Bilateral impacted cerumen 11/19/2020   Hearing loss 11/19/2020   Malignant tumor of thyroid gland (HCC) 10/08/2020   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   Polyarthritis    Bariatric surgery status    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/2017   Rheumatoid arthritis (HCC) 11/16/2016   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   Rotator cuff arthropathy, 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   S/P shoulder replacement 05/11/2016   Allergic rhinitis 10/19/2013   GERD (gastroesophageal reflux disease) 10/19/2013   Osteoporosis    Essential hypertension    Acquired hypothyroidism    Hyperlipidemia    Anxiety    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   Unspecified protein-calorie malnutrition (HCC) 10/14/2011   Vitamin deficiency 10/14/2011   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    Orientation RESPIRATION BLADDER Height & Weight     Self  Normal Incontinent Weight:   Height:     BEHAVIORAL SYMPTOMS/MOOD NEUROLOGICAL BOWEL NUTRITION STATUS      Incontinent Diet (dys 3)  AMBULATORY STATUS COMMUNICATION OF NEEDS Skin   Limited Assist Verbally Other (Comment) (redness)                       Personal Care Assistance Level of Assistance  Bathing, Feeding, Dressing Bathing Assistance: Limited assistance Feeding assistance: Limited assistance Dressing Assistance: Limited assistance     Functional Limitations Info  SPECIAL CARE FACTORS FREQUENCY                       Contractures      Additional Factors Info  Code Status, Allergies Code Status Info: full Allergies Info: Macrobid(Nitrofurantoin)           Current Medications (05/13/2023):  This is the current hospital active medication list Current  Facility-Administered Medications  Medication Dose Route Frequency Provider Last Rate Last Admin   0.9 %  sodium chloride infusion   Intravenous Continuous Wouk, Wilfred Curtis, MD 100 mL/hr at 05/13/23 1301 New Bag at 05/13/23 1301   acetaminophen (TYLENOL) tablet 650 mg  650 mg Oral Q6H PRN Gertha Calkin, MD       Or   acetaminophen (TYLENOL) suppository 650 mg  650 mg Rectal Q6H PRN Gertha Calkin, MD       acyclovir (ZOVIRAX) 200 MG capsule 400 mg  400 mg Oral BID Kathrynn Running, MD   400 mg at 05/13/23 1215   cefTRIAXone (ROCEPHIN) 1 g in sodium chloride 0.9 % 100 mL IVPB  1 g Intravenous Q24H Kathrynn Running, MD 200 mL/hr at 05/12/23 2304 1 g at 05/12/23 2304   feeding supplement (ENSURE ENLIVE / ENSURE PLUS) liquid 237 mL  237 mL Oral BID BM Gertha Calkin, MD   237 mL at 05/13/23 1003   levothyroxine (SYNTHROID) tablet 50 mcg  50 mcg Oral Q0600 Kathrynn Running, MD   50 mcg at 05/13/23 1215   midodrine (PROAMATINE) tablet 7.5 mg  7.5 mg Oral TID WC Wouk, Wilfred Curtis, MD   7.5 mg at 05/13/23 1215   pantoprazole (PROTONIX) EC tablet 40 mg  40 mg Oral Daily Kathrynn Running, MD   40 mg at 05/13/23 1002   polyethylene glycol (MIRALAX / GLYCOLAX) packet 17 g  17 g Oral Daily Kathrynn Running, MD   17 g at 05/12/23 1034   sodium bicarbonate tablet 650 mg  650 mg Oral Daily Kathrynn Running, MD   650 mg at 05/13/23 1215   sodium chloride flush (NS) 0.9 % injection 3 mL  3 mL Intravenous Q12H Gertha Calkin, MD   3 mL at 05/13/23 1002     Discharge Medications: Please see discharge summary for a list of discharge medications.  Relevant Imaging Results:  Relevant Lab Results:   Additional Information SS #: [id number]  Danell Vazquez E Jatavis Malek, LCSW

## 2023-05-14 ENCOUNTER — Inpatient Hospital Stay: Payer: 59

## 2023-05-14 DIAGNOSIS — N179 Acute kidney failure, unspecified: Secondary | ICD-10-CM | POA: Diagnosis not present

## 2023-05-14 LAB — CBC
HCT: 21.2 % — ABNORMAL LOW (ref 36.0–46.0)
Hemoglobin: 7.8 g/dL — ABNORMAL LOW (ref 12.0–15.0)
MCH: 33.8 pg (ref 26.0–34.0)
MCHC: 36.8 g/dL — ABNORMAL HIGH (ref 30.0–36.0)
MCV: 91.8 fL (ref 80.0–100.0)
Platelets: 48 10*3/uL — ABNORMAL LOW (ref 150–400)
RBC: 2.31 MIL/uL — ABNORMAL LOW (ref 3.87–5.11)
RDW: 17.9 % — ABNORMAL HIGH (ref 11.5–15.5)
WBC: 7.1 10*3/uL (ref 4.0–10.5)
nRBC: 0.6 % — ABNORMAL HIGH (ref 0.0–0.2)

## 2023-05-14 LAB — BASIC METABOLIC PANEL
Anion gap: 8 (ref 5–15)
BUN: 72 mg/dL — ABNORMAL HIGH (ref 8–23)
CO2: 18 mmol/L — ABNORMAL LOW (ref 22–32)
Calcium: 7.7 mg/dL — ABNORMAL LOW (ref 8.9–10.3)
Chloride: 110 mmol/L (ref 98–111)
Creatinine, Ser: 3.41 mg/dL — ABNORMAL HIGH (ref 0.44–1.00)
GFR, Estimated: 13 mL/min — ABNORMAL LOW (ref >60)
Glucose, Bld: 89 mg/dL (ref 70–99)
Potassium: 3.4 mmol/L — ABNORMAL LOW (ref 3.5–5.1)
Sodium: 136 mmol/L (ref 135–145)

## 2023-05-14 LAB — VITAMIN B12: Vitamin B-12: 1734 pg/mL — ABNORMAL HIGH (ref 180–914)

## 2023-05-14 LAB — AMMONIA: Ammonia: 24 umol/L (ref 9–35)

## 2023-05-14 LAB — LACTATE DEHYDROGENASE: LDH: 238 U/L — ABNORMAL HIGH (ref 98–192)

## 2023-05-14 LAB — HIV ANTIBODY (ROUTINE TESTING W REFLEX): HIV Screen 4th Generation wRfx: NONREACTIVE

## 2023-05-14 MED ORDER — CEPHALEXIN 250 MG PO CAPS
250.0000 mg | ORAL_CAPSULE | Freq: Every day | ORAL | Status: DC
  Administered 2023-05-14 – 2023-05-17 (×4): 250 mg via ORAL
  Filled 2023-05-14 (×4): qty 1

## 2023-05-14 MED ORDER — THIAMINE HCL 100 MG/ML IJ SOLN
500.0000 mg | Freq: Three times a day (TID) | INTRAVENOUS | Status: AC
  Administered 2023-05-14 – 2023-05-17 (×8): 500 mg via INTRAVENOUS
  Filled 2023-05-14 (×9): qty 5

## 2023-05-14 MED ORDER — SODIUM BICARBONATE 650 MG PO TABS
650.0000 mg | ORAL_TABLET | Freq: Two times a day (BID) | ORAL | Status: DC
  Administered 2023-05-14 – 2023-05-18 (×8): 650 mg via ORAL
  Filled 2023-05-14 (×8): qty 1

## 2023-05-14 MED ORDER — LEVOTHYROXINE SODIUM 137 MCG PO TABS
68.5000 ug | ORAL_TABLET | Freq: Every day | ORAL | Status: DC
  Administered 2023-05-15 – 2023-05-18 (×4): 68.5 ug via ORAL
  Filled 2023-05-14 (×4): qty 0.5

## 2023-05-14 MED ORDER — K PHOS MONO-SOD PHOS DI & MONO 155-852-130 MG PO TABS
500.0000 mg | ORAL_TABLET | ORAL | Status: AC
  Administered 2023-05-14 (×3): 500 mg via ORAL
  Filled 2023-05-14 (×3): qty 2

## 2023-05-14 NOTE — Progress Notes (Signed)
[facility]  ROUNDING NOTE   Subjective:   Patient sitting up in bed Awaiting breakfast No complaints to offer.   Creatinine 3.41 (3.87)   Objective:  Vital signs in last 24 hours:  Temp:  [98 F (36.7 C)-98.6 F (37 C)] 98.6 F (37 C) (10/12 0819) Pulse Rate:  [77-99] 99 (10/12 0819) Resp:  [18-20] 20 (10/12 0819) BP: (119-137)/(73-88) 137/88 (10/12 0819) SpO2:  [100 %] 100 % (10/12 0819)  Weight change:  There were no vitals filed for this visit.  Intake/Output: I/O last 3 completed shifts: In: 2663 [I.V.:2263; IV Piggyback:400] Out: -    Intake/Output this shift:  No intake/output data recorded.  Physical Exam: General: NAD  Head: Normocephalic, atraumatic. Moist oral mucosal membranes  Eyes: Anicteric,cachexia  Neck: Supple  Lungs:  Clear to auscultation  Heart: Regular rate and rhythm  Abdomen:  Soft, nontender  Extremities:  No peripheral edema.  Neurologic: Alert, moving all four extremities  Skin: No lesions  Access: None    Basic Metabolic Panel: Recent Labs  Lab 05/10/23 1117 05/11/23 2114 05/12/23 0422 05/13/23 0312 05/14/23 0943  NA 130* 128* 130* 133* 136  K 2.5* 2.5* 4.4 3.6 3.4*  CL 93* 93* 99 101 110  CO2 25 21* 19* 19* 18*  GLUCOSE 97 108* 93 92 89  BUN 40* 57* 60* 70* 72*  CREATININE 3.27* 3.80* 3.75* 3.87* 3.41*  CALCIUM 9.2 8.6* 8.0* 8.0* 7.7*  MG 1.6* 2.1  --  1.9  --   PHOS  --   --   --  <1.0*  --     Liver Function Tests: Recent Labs  Lab 05/10/23 1117 05/11/23 2114  AST 19 20  ALT 13 14  ALKPHOS 134* 145*  BILITOT 0.8 0.9  PROT 8.9* 8.4*  ALBUMIN <1.5* <1.5*   Recent Labs  Lab 05/11/23 2114  LIPASE 17   No results for input(s): "AMMONIA" in the last 168 hours.  CBC: Recent Labs  Lab 05/10/23 1117 05/11/23 2043 05/13/23 0312 05/14/23 0943  WBC 6.3 7.3 6.6  6.6 7.1  NEUTROABS 5.3 5.1 5.8  --   HGB 8.7* 10.3* 8.0*  7.8* 7.8*  HCT 25.1* 29.3* 22.2*  20.8* 21.2*  MCV 98.0 95.1 92.5   89.3 91.8  PLT 111* 82* 50*  51* 48*    Cardiac Enzymes: No results for input(s): "CKTOTAL", "CKMB", "CKMBINDEX", "TROPONINI" in the last 168 hours.  BNP: Invalid input(s): "POCBNP"  CBG: No results for input(s): "GLUCAP" in the last 168 hours.  Microbiology: Results for orders placed or performed during the hospital encounter of 05/11/23  Urine Culture     Status: Abnormal (Preliminary result)   Collection Time: 05/11/23  8:12 PM   Specimen: Urine, Random  Result Value Ref Range Status   Specimen Description   Final    URINE, RANDOM Performed at [facility], [address], [place], [place] [address]    Special Requests   Final    NONE Reflexed from [id number] Performed at [facility], [address] [place], [place] [address]    Culture (A)  Final    >=100,000 COLONIES/mL ESCHERICHIA COLI CULTURE REINCUBATED FOR BETTER GROWTH Performed at [facility], [address] [place], [place] [address]    Report Status PENDING  Incomplete   Organism ID, Bacteria ESCHERICHIA COLI (A)  Final      Susceptibility   Escherichia coli - MIC*    AMPICILLIN 8 SENSITIVE Sensitive  CEFAZOLIN <=4 SENSITIVE Sensitive     CEFEPIME <=0.12 SENSITIVE Sensitive     CEFTRIAXONE <=0.25 SENSITIVE Sensitive     CIPROFLOXACIN <=0.25 SENSITIVE Sensitive     GENTAMICIN <=1 SENSITIVE Sensitive     IMIPENEM <=0.25 SENSITIVE Sensitive     NITROFURANTOIN <=16 SENSITIVE Sensitive     TRIMETH/SULFA <=20 SENSITIVE Sensitive     AMPICILLIN/SULBACTAM <=2 SENSITIVE Sensitive     PIP/TAZO <=4 SENSITIVE Sensitive ug/mL    * >=100,000 COLONIES/mL ESCHERICHIA COLI  Culture, blood (Routine X 2) w Reflex to ID Panel     Status: None (Preliminary result)   Collection Time: 05/12/23 10:38 AM   Specimen: BLOOD  Result Value Ref Range Status   Specimen Description BLOOD LEFT [facility]  Final   Special Requests   Final    BOTTLES DRAWN AEROBIC AND ANAEROBIC Blood Culture adequate volume    Culture   Final    NO GROWTH 2 DAYS Performed at [facility] [address], [place], [place] [address]    Report Status PENDING  Incomplete  Culture, blood (Routine X 2) w Reflex to ID Panel     Status: None (Preliminary result)   Collection Time: 05/12/23 10:38 AM   Specimen: BLOOD  Result Value Ref Range Status   Specimen Description BLOOD RIGHT ARM  Final   Special Requests   Final    BOTTLES DRAWN AEROBIC AND ANAEROBIC Blood Culture results may not be optimal due to an excessive volume of blood received in culture bottles   Culture   Final    NO GROWTH 2 DAYS Performed at [facility], [address] [place], [place] [address]    Report Status PENDING  Incomplete  MRSA Next Gen by PCR, Nasal     Status: None   Collection Time: 05/12/23  4:20 PM   Specimen: Nasal Mucosa; Nasal Swab  Result Value Ref Range Status   MRSA by PCR Next Gen NOT DETECTED NOT DETECTED Final    Comment: (NOTE) The GeneXpert MRSA Assay (FDA approved for NASAL specimens only), is one component of a comprehensive MRSA colonization surveillance program. It is not intended to diagnose MRSA infection nor to guide or monitor treatment for MRSA infections. Test performance is not FDA approved in patients less than 42 years old. Performed at [facility], [address] [place], [place] [address]     Coagulation Studies: Recent Labs    05/12/23 0527  LABPROT 14.6  INR 1.1    Urinalysis: Recent Labs    05/11/23 2012  COLORURINE AMBER*  LABSPEC 1.005  PHURINE 7.0  GLUCOSEU NEGATIVE  HGBUR MODERATE*  BILIRUBINUR NEGATIVE  KETONESUR NEGATIVE  PROTEINUR 30*  NITRITE NEGATIVE  LEUKOCYTESUR LARGE*      Imaging: No results found.   Medications:    sodium chloride 100 mL/hr at 05/14/23 0111   cefTRIAXone (ROCEPHIN)  IV 1 g (05/13/23 2248)    acyclovir  400 mg Oral BID   feeding supplement  237 mL Oral BID BM   levothyroxine  50 mcg Oral Q0600    midodrine  7.5 mg Oral TID WC   mouth rinse  15 mL Mouth Rinse 4 times per day   pantoprazole  40 mg Oral Daily   polyethylene glycol  17 g Oral Daily   sodium bicarbonate  650 mg Oral Daily   sodium chloride flush  3 mL Intravenous Q12H   acetaminophen **OR** acetaminophen, mouth rinse  Assessment/ Plan:  Ms. Kathy [name]  is a 82 y.o.  female  with hypertension, hypothyroidism, peptic ulcer disease, rheumatoid arthritis, history of gastric surgery, and shoulder replacement who was admitted to [facility] on 05/11/2023 for AKI (acute kidney injury) (HCC) [N17.9] Cystitis [N30.90] AMS (altered mental status) [R41.82] 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, psychotic disturbance, mood disturbance, or anxiety, unspecified dementia type (HCC) [F03.C0]   Acute Kidney Injury: baseline creatinine of 0.77 on 05/03/23. Urine consistent with urinary tract infection.  - Creatinine improving - Would encourage accurate urine output  - No acute indication for dialysis.  - holding furosemide.   Lab Results  Component Value Date   CREATININE 3.41 (H) 05/14/2023   CREATININE 3.87 (H) 05/13/2023   CREATININE 3.75 (H) 05/12/2023    Intake/Output Summary (Last 24 hours) at 05/14/2023 1135 Last data filed at 05/14/2023 0600 Gross per 24 hour  Intake 2663.02 ml  Output --  Net 2663.02 ml   Hyponatremia: corrected   Acute metabolic acidosis: Continue oral sodium bicarbonate.    Anemia with acute kidney injury and Thrombocytopenia: drop in numbers  Multiple Myeloma - appreciate hematology input.    6.   Urinary tract infection: E. Coli             - ceftriaxone.     LOS: 2   10/12/202411:35 AM

## 2023-05-14 NOTE — Plan of Care (Signed)
  Problem: Fluid Volume: Goal: Hemodynamic stability will improve Outcome: Progressing   Problem: Clinical Measurements: Goal: Diagnostic test results will improve Outcome: Progressing Goal: Signs and symptoms of infection will decrease Outcome: Progressing   Problem: Respiratory: Goal: Ability to maintain adequate ventilation will improve Outcome: Progressing   Problem: Education: Goal: Knowledge of General Education information will improve Description: Including pain rating scale, medication(s)/side effects and non-pharmacologic comfort measures Outcome: Progressing   Problem: Urinary Elimination: Goal: Signs and symptoms of infection will decrease Outcome: Progressing   

## 2023-05-14 NOTE — Progress Notes (Signed)
Physical Therapy Treatment Patient Details Name: Kathy [name] MRN: [id number] DOB: 03-30-1941 Today's Date: 05/14/2023   History of Present Illness Pt is a 82 y.o. female with a history of dementia, pancreatitis, Roux-en-Y gastric bypass, hypertension who was sent to the ED on 05/11/23 from her SNF due to increased confusion from her baseline. Unclear when this started but facility staff tonight think it has been going on for several days. Patient reports "I do not feel good" but is not able to elaborate.    PT Comments  Pt was long sitting in bed upon arrival. She endorses having had incontinence BM however upon inspection, pt just had pass gas. She was wet from urination however. Assisted with hygiene care prior to getting OOB. Pt required max assist to achieve EOB sitting. She tolerated standing 3 x EOB prior to standing and taking steps to recliner. Performed transfers with BUE supported on author. Pt will continue to benefit from skilled PT going forward to maximize independence while decreasing caregiver burden.      If plan is discharge home, recommend the following: A lot of help with walking and/or transfers;A lot of help with bathing/dressing/bathroom;Assistance with cooking/housework;Assistance with feeding;Direct supervision/assist for medications management;Direct supervision/assist for financial management;Assist for transportation;Help with stairs or ramp for entrance;Supervision due to cognitive status     Equipment Recommendations  None recommended by PT       Precautions / Restrictions Precautions Precautions: Fall Restrictions Weight Bearing Restrictions: No     Mobility  Bed Mobility Overal bed mobility: Needs Assistance Bed Mobility: Rolling, Sidelying to Sit, Supine to Sit Rolling: Max assist Sidelying to sit: Max assist Supine to sit: Max assist   Transfers Overall transfer level: Needs assistance Equipment used: None Transfers: Bed to  chair/wheelchair/BSC  Squat pivot transfers: Max assist  General transfer comment: Pt was able to stand erect and take severl steps to recliner with max assist for lateral wt shift to allow opposite LE advancement. Overall pt stood 3 x EOB prior to standing an taking steps to recliner. Each trial STS the bed was progressively lowered however remain elevated throughout all STS transfers.    Ambulation/Gait Ambulation/Gait assistance: Max assist Gait Distance (Feet): 3 Feet Assistive device: None Gait Pattern/deviations: Step-to pattern, Steppage Gait velocity: decreased  General Gait Details: Pt was able to clear R/L LE to advance to take several steps along EOB to recliner. alot of assistance with lateral wt shifting   Balance Overall balance assessment: Needs assistance Sitting-balance support: Feet supported, Bilateral upper extremity supported Sitting balance-Leahy Scale: Fair Sitting balance - Comments: at first presented with posterior lean that improved with time   Standing balance support: During functional activity, Bilateral upper extremity supported Standing balance-Leahy Scale: Poor Standing balance comment: pt is high fall risk due to weakness in all extremities      Cognition Arousal: Alert Behavior During Therapy: WFL for tasks assessed/performed Overall Cognitive Status: Within Functional Limits for tasks assessed    General Comments: Pt is A and O x 2. Agreeable to session and OOB activity               Pertinent Vitals/Pain Pain Assessment Pain Assessment: 0-10 Pain Score: 3  Pain Location: LBP Pain Descriptors / Indicators: Aching, Discomfort Pain Intervention(s): Limited activity within patient's tolerance, Monitored during session, Premedicated before session, Repositioned     PT Goals (current goals can now be found in the care plan section) Acute Rehab PT Goals Patient Stated Goal: get better and return  to [facility]." Progress towards PT  goals: Progressing toward goals    Frequency    Min 1X/week       AM-PAC PT "6 Clicks" Mobility   Outcome Measure  Help needed turning from your back to your side while in a flat bed without using bedrails?: A Lot Help needed moving from lying on your back to sitting on the side of a flat bed without using bedrails?: A Lot Help needed moving to and from a bed to a chair (including a wheelchair)?: A Lot Help needed standing up from a chair using your arms (e.g., wheelchair or bedside chair)?: A Lot Help needed to walk in hospital room?: A Lot Help needed climbing 3-5 steps with a railing? : Total 6 Click Score: 11    End of Session   Activity Tolerance: Patient tolerated treatment well Patient left: in chair;with call bell/phone within reach;with chair alarm set Nurse Communication: Mobility status PT Visit Diagnosis: Unsteadiness on feet (R26.81);Muscle weakness (generalized) (M62.81)     Time: 8101-7510 PT Time Calculation (min) (ACUTE ONLY): 26 min  Charges:    $Therapeutic Activity: 23-37 mins PT General Charges $$ ACUTE PT VISIT: 1 Visit                     Jetta Lout PTA 05/14/23, 9:35 AM

## 2023-05-14 NOTE — Consult Note (Signed)
[facility] CONSULT NOTE  Patient Care Team: [name], MD as PCP - General (Internal Medicine) Wyline Mood, Dorothe Pea, MD as PCP - Cardiology (Cardiology) Dorisann Frames, MD as Referring Physician (Endocrinology) Doreatha Massed, MD as Medical Oncologist (Medical Oncology) Therese Sarah, RN as Oncology Nurse Navigator (Medical Oncology)  CHIEF COMPLAINTS/PURPOSE OF CONSULTATION:   HISTORY OF PRESENTING ILLNESS: Multiple myeloma Kathy [name] 82 y.o.  female pleasant patient with a with multiple medical problems including rheumatoid arthritis and recent diagnosis of multiple myeloma currently on treatment is currently admitted to hospital for generalized weakness mental status changes.  Patient diagnosed with UTI and also acute renal failure.  Patient diagnosed with IgG kappa multiple myeloma [ Dr.Kattragadda, Azuri Penn]-.  Presentation with malignant hypercalcemia since 01/23/2023; Dr.Kattragadda, Aayushi Penn]. It appears the patient has been unable to get her treatments on schedule because of recent rehab admission. Patient currently on Darzalex and dexamethasone.  However, patient has not started Revlimid.  Most recent dose of Darzalex; and Dex was approximately 4 days ago.     On admission to hospital patient was noted to have sodium of 128 potassium 2.5 bicarb of 21 and also creatinine of 3.8.  Hemoglobin 10 and platelets in the 80s.  UA positive for E. coli infection.  Patient is currently on antibiotics and IV fluids.  Patient seems to be improving clinically.  Patient is alert sitting in a chair.  However still unable to give a coherent history.  Review of Systems  Unable to perform ROS: Mental acuity    MEDICAL HISTORY:  Past Medical History:  Diagnosis Date   Abnormal findings on diagnostic imaging of heart and coronary circulation    Anemia    FROM BLEEDING ULCER   Anxiety    takes Alprazolam daily as needed   Arthritis    dx with RA 2017    Bariatric surgery status    Cataract    Diverticulosis    Gastro-esophageal reflux disease with esophagitis    Gastrojejunal ulcer with hemorrhage    Headache(784.0)    occasionally   History of blood transfusion    no abnormal reaction noted   History of bronchitis    last time many yrs ago   Hyperlipidemia    PT DENIES THIS DX -  ON NO MEDS AND NO ONE HAS TOLD HER   Hypertension    takes Amlodipine daily   Hypothyroidism    takes Synthroid daily   Insomnia    Joint pain    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   Localized edema    Nocturia    Numbness    occasionally left arm at night   Obesity    Osteoporosis    takes Fosamax weekly   Pain in joint involving pelvic region and thigh    Peripheral edema    takes Lasix daily as needed   PONV (postoperative nausea and vomiting)    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/2017   Rheumatoid arthritis, unspecified (HCC)    Rotator cuff arthropathy, 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   Sleep apnea    Phreesia 08/22/2020   Stomach ulcer    Thyroid disease    Phreesia 08/22/2020   Unspecified injury of muscle(s) and tendon(s) of the rotator cuff of left shoulder, subsequent encounter    Unspecified osteoarthritis, unspecified site    Wears glasses    Wears partial dentures     SURGICAL HISTORY: Past Surgical History:  Procedure Laterality Date  ABDOMINAL HYSTERECTOMY     partial   cataract surgery Bilateral    CHOLECYSTECTOMY     COLONOSCOPY     ESOPHAGOGASTRODUODENOSCOPY (EGD) WITH PROPOFOL N/A 01/11/2022   Procedure: ESOPHAGOGASTRODUODENOSCOPY (EGD) WITH PROPOFOL;  Surgeon: Iva Boop, MD;  Location: [facility] ENDOSCOPY;  Service: Gastroenterology;  Laterality: N/A;   EYE SURGERY     CATARACTS BOTH   gastric bypass surgery     HOT HEMOSTASIS N/A 01/11/2022   Procedure: HOT HEMOSTASIS (ARGON PLASMA COAGULATION/BICAP);  Surgeon: Iva Boop, MD;  Location: [facility] ENDOSCOPY;  Service: Gastroenterology;  Laterality:  N/A;   JOINT REPLACEMENT Bilateral    hip   REVERSE SHOULDER ARTHROPLASTY Right 05/11/2016   Procedure: REVERSE SHOULDER ARTHROPLASTY;  Surgeon: Teryl Lucy, MD;  Location: [facility] OR;  Service: Orthopedics;  Laterality: Right;   REVISION TOTAL HIP ARTHROPLASTY Left 10/03/2013   DR Turner Daniels   ROTATOR CUFF REPAIR     SCLEROTHERAPY  01/11/2022   Procedure: Susa Day;  Surgeon: Iva Boop, MD;  Location: [facility] ENDOSCOPY;  Service: Gastroenterology;;   TOTAL HIP REVISION Left 10/03/2013   Procedure: TOTAL HIP REVISION- left;  Surgeon: Nestor Lewandowsky, MD;  Location: [facility] OR;  Service: Orthopedics;  Laterality: Left;   TOTAL KNEE ARTHROPLASTY Left 11/29/2017   Procedure: LEFT TOTAL KNEE ARTHROPLASTY;  Surgeon: Teryl Lucy, MD;  Location: [facility] OR;  Service: Orthopedics;  Laterality: Left;   TOTAL SHOULDER ARTHROPLASTY Left 11/09/2016   Procedure: TOTAL REVERSE SHOULDER ARTHROPLASTY;  Surgeon: Teryl Lucy, MD;  Location: [facility] OR;  Service: Orthopedics;  Laterality: Left;   TOTAL SHOULDER REPLACEMENT Left 10/2016   TOTAL THYROIDECTOMY     UPPER GASTROINTESTINAL ENDOSCOPY      SOCIAL HISTORY: Social History   Socioeconomic History   Marital status: Divorced    Spouse name: Not on file   Number of children: Not on file   Years of education: Not on file   Highest education level: Not on file  Occupational History   Occupation: disabled  Tobacco Use   Smoking status: Never    Passive exposure: Never   Smokeless tobacco: Never  Vaping Use   Vaping status: Never Used  Substance and Sexual Activity   Alcohol use: No   Drug use: No   Sexual activity: Not Currently    Birth control/protection: Surgical  Other Topics Concern   Not on file  Social History Narrative   Not on file   Social Determinants of Health   Financial Resource Strain: Low Risk  (09/14/2021)   Overall Financial Resource Strain (CARDIA)    Difficulty of Paying Living Expenses: Not hard at all  Food Insecurity: Patient  Unable To Answer (05/12/2023)   Hunger Vital Sign    Worried About Programme researcher, broadcasting/film/video in the Last Year: Patient unable to answer    Ran Out of Food in the Last Year: Patient unable to answer  Transportation Needs: Patient Unable To Answer (05/12/2023)   PRAPARE - Transportation    Lack of Transportation (Medical): Patient unable to answer    Lack of Transportation (Non-Medical): Patient unable to answer  Physical Activity: Sufficiently Active (09/14/2021)   Exercise Vital Sign    Days of Exercise per Week: 6 days    Minutes of Exercise per Session: 40 min  Stress: No Stress Concern Present (09/14/2021)   [employer] of Occupational Health - Occupational Stress Questionnaire    Feeling of Stress : Not at all  Social Connections: Moderately Isolated (09/14/2021)   Social Connection  and Isolation Panel [NHANES]    Frequency of Communication with Friends and Family: More than three times a week    Frequency of Social Gatherings with Friends and Family: More than three times a week    Attends Religious Services: More than 4 times per year    Active Member of [employer] or Organizations: No    Attends Banker Meetings: Never    Marital Status: Divorced  Catering manager Violence: Patient Unable To Answer (05/12/2023)   Humiliation, Afraid, Rape, and Kick questionnaire    Fear of Current or Ex-Partner: Patient unable to answer    Emotionally Abused: Patient unable to answer    Physically Abused: Patient unable to answer    Sexually Abused: Patient unable to answer    FAMILY HISTORY: Family History  Problem Relation Age of Onset   Diabetes Mother    Congestive Heart Failure Mother    Lung cancer Father    Thrombosis Sister    CAD Brother        CABG   Colon cancer Neg Hx    Esophageal cancer Neg Hx    Stomach cancer Neg Hx    Rectal cancer Neg Hx     ALLERGIES:  is allergic to macrobid [nitrofurantoin].  MEDICATIONS:  Current Facility-Administered Medications   Medication Dose Route Frequency Provider Last Rate Last Admin   0.9 %  sodium chloride infusion   Intravenous Continuous Kathrynn Running, MD 100 mL/hr at 05/14/23 1751 New Bag at 05/14/23 1751   acetaminophen (TYLENOL) tablet 650 mg  650 mg Oral Q6H PRN Gertha Calkin, MD   650 mg at 05/14/23 1322   Or   acetaminophen (TYLENOL) suppository 650 mg  650 mg Rectal Q6H PRN Gertha Calkin, MD       acyclovir (ZOVIRAX) 200 MG capsule 400 mg  400 mg Oral BID Kathrynn Running, MD   400 mg at 05/14/23 0955   cephALEXin (KEFLEX) capsule 250 mg  250 mg Oral QHS Wouk, Wilfred Curtis, MD       feeding supplement (ENSURE ENLIVE / ENSURE PLUS) liquid 237 mL  237 mL Oral BID BM Irena Cords V, MD   237 mL at 05/14/23 1322   [START ON 05/15/2023] levothyroxine (SYNTHROID) tablet 68.5 mcg  68.5 mcg Oral Q0600 Wouk, Wilfred Curtis, MD       midodrine (PROAMATINE) tablet 7.5 mg  7.5 mg Oral TID WC Wouk, Wilfred Curtis, MD   7.5 mg at 05/14/23 1739   Oral care mouth rinse  15 mL Mouth Rinse 4 times per day Kathrynn Running, MD   15 mL at 05/14/23 1700   Oral care mouth rinse  15 mL Mouth Rinse PRN Wouk, Wilfred Curtis, MD       pantoprazole (PROTONIX) EC tablet 40 mg  40 mg Oral Daily Kathrynn Running, MD   40 mg at 05/14/23 0955   phosphorus (K PHOS NEUTRAL) tablet 500 mg  500 mg Oral Q4H Nazari, Walid A, RPH   500 mg at 05/14/23 1739   polyethylene glycol (MIRALAX / GLYCOLAX) packet 17 g  17 g Oral Daily Kathrynn Running, MD   17 g at 05/14/23 1610   sodium bicarbonate tablet 650 mg  650 mg Oral BID Wouk, Wilfred Curtis, MD       sodium chloride flush (NS) 0.9 % injection 3 mL  3 mL Intravenous Q12H Gertha Calkin, MD   3 mL at 05/14/23 0956   thiamine (VITAMIN  B1) 500 mg in sodium chloride 0.9 % 50 mL IVPB  500 mg Intravenous TID Kathrynn Running, MD 100 mL/hr at 05/14/23 1753 500 mg at 05/14/23 1753    PHYSICAL EXAMINATION:   Vitals:   05/14/23 1619 05/14/23 2125  BP: 127/73 124/76  Pulse: 84 87  Resp: 18  20  Temp: 98.2 F (36.8 C) 98.1 F (36.7 C)  SpO2: 100% 100%   There were no vitals filed for this visit.  Patient is alert but oriented x 1-2. Physical Exam Vitals and nursing note reviewed.  HENT:     Head: Normocephalic and atraumatic.     Mouth/Throat:     Pharynx: Oropharynx is clear.  Eyes:     Extraocular Movements: Extraocular movements intact.     Pupils: Pupils are equal, round, and reactive to light.  Cardiovascular:     Rate and Rhythm: Normal rate and regular rhythm.  Pulmonary:     Comments: Decreased breath sounds bilaterally.  Abdominal:     Palpations: Abdomen is soft.  Musculoskeletal:        General: Normal range of motion.     Cervical back: Normal range of motion.  Skin:    General: Skin is warm.  Neurological:     General: No focal deficit present.     Mental Status: She is alert.     LABORATORY DATA:  I have reviewed the data as listed Lab Results  Component Value Date   WBC 7.1 05/14/2023   HGB 7.8 (L) 05/14/2023   HCT 21.2 (L) 05/14/2023   MCV 91.8 05/14/2023   PLT 48 (L) 05/14/2023   Recent Labs    05/03/23 0831 05/10/23 1117 05/11/23 2114 05/12/23 0422 05/13/23 0312 05/14/23 0943  NA 133* 130* 128* 130* 133* 136  K 2.5* 2.5* 2.5* 4.4 3.6 3.4*  CL 97* 93* 93* 99 101 110  CO2 28 25 21* 19* 19* 18*  GLUCOSE 78 97 108* 93 92 89  BUN 12 40* 57* 60* 70* 72*  CREATININE 0.77 3.27* 3.80* 3.75* 3.87* 3.41*  CALCIUM 10.7* 9.2 8.6* 8.0* 8.0* 7.7*  GFRNONAA >60 14* 11* 12* 11* 13*  PROT 8.6* 8.9* 8.4*  --   --   --   ALBUMIN <1.5* <1.5* <1.5*  --   --   --   AST 21 19 20   --   --   --   ALT 13 13 14   --   --   --   ALKPHOS 94 134* 145*  --   --   --   BILITOT 0.4 0.8 0.9  --   --   --     RADIOGRAPHIC STUDIES: I have personally reviewed the radiological images as listed and agreed with the findings in the report. MR BRAIN WO CONTRAST  Result Date: 05/14/2023 CLINICAL DATA:  Encephalopathy.  Multiple myeloma. EXAM: MRI HEAD  WITHOUT CONTRAST TECHNIQUE: Multiplanar, multiecho pulse sequences of the brain and surrounding structures were obtained without intravenous contrast. COMPARISON:  Head CT 05/11/2023.  MRI brain 03/11/2023. FINDINGS: Brain: No acute infarct or hemorrhage. Stable background of mild chronic small-vessel disease. Unchanged temporoparietal predominant atrophy pattern. No acute hydrocephalus or extra-axial collection. No mass or abnormal susceptibility. Vascular: Normal flow voids. Skull and upper cervical spine: Unchanged numerous small T2 hyperintense lesions throughout the skull, compatible with given history of multiple myeloma. Sinuses/Orbits: No acute findings. Other: None. IMPRESSION: 1. No acute intracranial process. 2. Unchanged numerous small T2 hyperintense lesions throughout the skull, compatible  with given history of multiple myeloma. Electronically Signed   By: Orvan Falconer M.D.   On: 05/14/2023 19:46   CT Renal Stone Study  Result Date: 05/12/2023 CLINICAL DATA:  Abdominal and flank pain EXAM: CT ABDOMEN AND PELVIS WITHOUT CONTRAST TECHNIQUE: Multidetector CT imaging of the abdomen and pelvis was performed following the standard protocol without IV contrast. RADIATION DOSE REDUCTION: This exam was performed according to the departmental dose-optimization program which includes automated exposure control, adjustment of the mA and/or kV according to patient size and/or use of iterative reconstruction technique. COMPARISON:  04/06/2023 FINDINGS: Lower chest: No acute abnormality. Bibasilar atelectasis. Stable small pericardial effusion. Hepatobiliary: No focal liver abnormality is seen. Status post cholecystectomy. No biliary dilatation. Pancreas: Unremarkable Spleen: Unremarkable Adrenals/Urinary Tract: The adrenal glands are unremarkable. The kidneys are normal in size and position. 7 mm nonobstructing calculus noted within the interpolar region of the left kidney. The kidneys are otherwise  unremarkable. The bladder is obscured by streak artifact. Stomach/Bowel: Surgical changes of Roux-en-Y gastric bypass are identified. Moderate descending and sigmoid colonic diverticulosis without superimposed acute inflammatory change. Evaluation of pelvis is slightly limited by streak artifact. The stomach, small bowel, and large bowel are otherwise unremarkable. Appendix normal. No free intraperitoneal gas or fluid. Vascular/Lymphatic: Mild aortoiliac atherosclerotic calcification. No aortic aneurysm. No pathologic adenopathy within the abdomen and pelvis. Reproductive: Obscured by streak artifact Other: No abdominal wall hernia. Moderate diffuse subcutaneous body wall edema. 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 Osseous structures are diffusely osteopenic. 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 IMPRESSION: 1. No acute intra-abdominal pathology identified. No definite radiographic explanation for the patient's reported symptoms. 2. Mild left nonobstructing nephrolithiasis. No urolithiasis. No hydronephrosis. 3. Moderate distal colonic diverticulosis without superimposed acute inflammatory change. 4. Stable small pericardial effusion. 5.  Aortic Atherosclerosis (ICD10-I70.0). Electronically Signed   By: Helyn Numbers M.D.   On: 05/12/2023 00:24   CT Head Wo Contrast  Result Date: 05/11/2023 CLINICAL DATA:  Altered mental status EXAM: CT HEAD WITHOUT CONTRAST TECHNIQUE: Contiguous axial images were obtained from the base of the skull through the vertex without intravenous contrast. RADIATION DOSE REDUCTION: This exam was performed according to the departmental dose-optimization program which includes automated exposure control, adjustment of the mA and/or kV according to patient size and/or use of iterative reconstruction technique. COMPARISON:  CT 03/11/2023 01/04/2023, MRI 03/11/2023 FINDINGS:  Brain: No acute territorial infarction, hemorrhage or intracranial mass. The ventricles are nonenlarged. Mild chronic small vessel ischemic changes of the white matter Vascular: No hyperdense vessels.  Carotid vascular calcification Skull: No fracture. Heterogeneous lucencies throughout the skull base and calvarium with multiple small lucent lesions, similar compared with recent priors, increased compared to 2018 Sinuses/Orbits: No acute finding. Other: None IMPRESSION: 1. No CT evidence for acute intracranial abnormality. 2. Mild chronic small vessel ischemic changes of the white matter. 3. Heterogeneous lucencies throughout the skull base and calvarium with multiple small lucent lesions, similar compared with recent priors, increased compared to 2018. Correlate for any history myeloma or marrow disease. Electronically Signed   By: Jasmine Pang M.D.   On: 05/11/2023 22:37   DG Chest Portable 1 View  Result Date: 05/11/2023 CLINICAL DATA:  Weakness and confusion.  Altered mental status. EXAM: PORTABLE CHEST 1 VIEW COMPARISON:  Chest radiograph 04/06/2023, CT 03/11/2023 FINDINGS: Mild cardiomegaly. Stable mediastinal contours. Aortic atherosclerosis. Minimal ill-defined opacity in the right infrahilar region, likely tortuous vessels when compared with prior  CT. No acute airspace disease, large pleural effusion, or pneumothorax. 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 No change from prior exam. The left shoulder reverse arthroplasty appears intact. IMPRESSION: 1. Mild cardiomegaly. No acute intrathoracic abnormality. 2. 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 Recommend dedicated radiographs of the shoulder. Electronically Signed   By: Narda Rutherford M.D.   On: 05/11/2023 22:03    Courtney Bellizzi Shi 82 y.o.  female pleasant patient with  a with multiple medical problems including rheumatoid arthritis and recent diagnosis of multiple myeloma currently on treatment is currently admitted to hospital for generalized weakness mental status changes.  Patient diagnosed with UTI and also acute renal failure.  #  IgG kappa multiple myeloma [Presentation with malignant hypercalcemia since 01/23/2023; Dr.Kattragadda, Pattricia Boss Penn]-patient currently on Darzalex and dexamethasone.  However, patient has not started Revlimid.  Most recent dose of Darzalex was approximately 4 days ago.  It appears the patient has been unable to get her treatments on schedule because of recent rehab admission.   # Acute renal failure/severe electrolyte imbalance-multifactorial/UTI-question progression of multiple myeloma-given the significant lapses in the treatment.   # Anemia/thrombocytopenia likely secondary to multiple myeloma-acute renal failure/underlying sepsis.  No concern for any microangiopathic hemolytic anemia.  Review of smear shows no evidence of any schistocytes.  # Severe malnutrition:   # Recommendations:  # Continue current scope of care with IV fluids/IV antibiotics.  Agree with nephrology evaluation.  # Will order multiple myeloma panel; kappa lambda light chain ratio iron studies ferritin B12 folic acid; LDH haptoglobin.   Thank you Dr.Wouk for allowing me to participate in the care of your pleasant patient. Please do not hesitate to contact me with questions or concerns in the interim. I will inform Dr.Kattragadda, patient's oncologist of the patient's admission.     Earna Coder, MD 05/14/2023 10:18 PM

## 2023-05-14 NOTE — Progress Notes (Signed)
PROGRESS NOTE    Kathy [name]  ZOX:[id number] DOB: July 24, 1941 DOA: 05/11/2023 PCP: [name], MD  Outpatient Specialists: oncology    Brief Narrative:   Kathy [name] is a 82 y.o. female with medical history significant for history of anemia, thrombocytopenia, currently on chemotherapy withDaratumumab SQ + Lenalidomide + Dexamethasone (DaraRd) q28d for multiple myeloma, peptic ulcer disease, GERD, hypertension, hypothyroidism, osteoporosis, obesity, sleep apnea, history of pancreatitis, patient is a limited historian and does not give HPI it is per chart review and per nursing note.  Per report patient states that she does not feel well.    Assessment & Plan:   Principal Problem:   AKI (acute kidney injury) (HCC) Active Problems:   Essential hypertension   Acquired hypothyroidism   GERD (gastroesophageal reflux disease)   S/P shoulder replacement   Rheumatoid arthritis (HCC)   History of Roux-en-Y gastric bypass   Anemia   Multiple myeloma (HCC)   Sepsis secondary to UTI (HCC)   Electrolyte abnormality   Thrombocytopenia (HCC)   AMS (altered mental status)  # Encephalopathy Likely 2/2 uti and aki and multiple myeloma. CT head neg, cxr nothing acute, no intra-abdominal acute pathology on CT of abdomen/pelvis, no signs liver dysfunction. Abx started prior to blood cultures. No report of seizure-like activity. Brother says mental status has been declining for last two months since start of MM treatment. Modest improvement today. Tsh mild elevation but t4 wnl. Corrected calcium wnl - f/u MRI - treat as below - monitor cultures - start high-dose thiamine - f/u ammonia - consider neurology consult if onc does not think this fits with MM and its treatment  # Acute cystitis Very poor historian currently but urinalysis is suggestive of infection. Is immune compromised. Culture growing 10^5 e coli, pan-sensitive - renally-dosed keflex to complete 7-day course  #  Thrombocytopenia New, plts have trended down to 50s, suspect 2/2 recent MM diagnosis, did receive heparin little more than a month ago at that hospitalization - will reach out to patient's oncologist - monitor for now, hold heparin  # AKI # Metabolic encephalopathy Baseline normal kidney function, here in the 3s where it has been for the last couple of days. CT stone protocol no signs obstruction. Cr has not improved with fluids - continue fluids - monitor for diarrhea/vomiting - hold home lasix - nephro following, no additional w/u has been advised - cont sodium bicarb  # Multiple myeloma Recent diagnosis, started on darzalex and dexamethasone - cont acyclovir  # Hx GI bleed 2/2 GJ ulcer. Hgb here stable from priors - monitor  # Hypothyroid Tsh elevated, t4 wnl - cont home synthroid but will increase dose from 50 to 68.5  # RA On weekly adalimumab at home  # Chronic hypotension - cont home midodrine  # End-of-life care - palliative consulted  DVT prophylaxis: SCDs Code Status: full Family Communication: brother and other family members at bedside 10/12  Level of care: Med-Surg Status is: Inpatient Remains inpatient appropriate because: severity of illness    Consultants:  Nephrology, oncology  Procedures: none  Antimicrobials:  Ceftriaxone>keflex   Subjective: No complaints  Objective: Vitals:   05/13/23 1947 05/13/23 2347 05/14/23 0347 05/14/23 0819  BP: 119/80 133/77 126/73 137/88  Pulse: 84 77 99 99  Resp: 20 20 20 20   Temp: 98 F (36.7 C) 98.5 F (36.9 C) 98.2 F (36.8 C) 98.6 F (37 C)  TempSrc: Oral Axillary Axillary Oral  SpO2: 100% 100% 100% 100%  Intake/Output Summary (Last 24 hours) at 05/14/2023 1233 Last data filed at 05/14/2023 1200 Gross per 24 hour  Intake 3184.45 ml  Output --  Net 3184.45 ml   There were no vitals filed for this visit.  Examination:  General exam: Appears calm and comfortable  Respiratory system:  Clear to auscultation. Respiratory effort normal. Cardiovascular system: S1 & S2 heard, RRR. No JVD, murmurs, rubs, gallops or clicks. No pedal edema. Gastrointestinal system: Abdomen is nondistended, soft and nontender. No organomegaly or masses felt.   Central nervous system: Alert and oriented to self, moving all 4 Extremities: Symmetric 5 x 5 power. Skin: No rashes, lesions or ulcers Psychiatry: calm and confused    Data Reviewed: I have personally reviewed following labs and imaging studies  CBC: Recent Labs  Lab 05/10/23 1117 05/11/23 2043 05/13/23 0312 05/14/23 0943  WBC 6.3 7.3 6.6  6.6 7.1  NEUTROABS 5.3 5.1 5.8  --   HGB 8.7* 10.3* 8.0*  7.8* 7.8*  HCT 25.1* 29.3* 22.2*  20.8* 21.2*  MCV 98.0 95.1 92.5  89.3 91.8  PLT 111* 82* 50*  51* 48*   Basic Metabolic Panel: Recent Labs  Lab 05/10/23 1117 05/11/23 2114 05/12/23 0422 05/13/23 0312 05/14/23 0943  NA 130* 128* 130* 133* 136  K 2.5* 2.5* 4.4 3.6 3.4*  CL 93* 93* 99 101 110  CO2 25 21* 19* 19* 18*  GLUCOSE 97 108* 93 92 89  BUN 40* 57* 60* 70* 72*  CREATININE 3.27* 3.80* 3.75* 3.87* 3.41*  CALCIUM 9.2 8.6* 8.0* 8.0* 7.7*  MG 1.6* 2.1  --  1.9  --   PHOS  --   --   --  <1.0*  --    GFR: Estimated Creatinine Clearance: 11.1 mL/min (A) (by C-G formula based on SCr of 3.41 mg/dL (H)). Liver Function Tests: Recent Labs  Lab 05/10/23 1117 05/11/23 2114  AST 19 20  ALT 13 14  ALKPHOS 134* 145*  BILITOT 0.8 0.9  PROT 8.9* 8.4*  ALBUMIN <1.5* <1.5*   Recent Labs  Lab 05/11/23 2114  LIPASE 17   No results for input(s): "AMMONIA" in the last 168 hours. Coagulation Profile: Recent Labs  Lab 05/12/23 0527  INR 1.1   Cardiac Enzymes: No results for input(s): "CKTOTAL", "CKMB", "CKMBINDEX", "TROPONINI" in the last 168 hours. BNP (last 3 results) No results for input(s): "PROBNP" in the last 8760 hours. HbA1C: No results for input(s): "HGBA1C" in the last 72 hours. CBG: No results for  input(s): "GLUCAP" in the last 168 hours. Lipid Profile: No results for input(s): "CHOL", "HDL", "LDLCALC", "TRIG", "CHOLHDL", "LDLDIRECT" in the last 72 hours. Thyroid Function Tests: Recent Labs    05/12/23 0422 05/12/23 1038 05/13/23 0312  TSH  --    < > 13.805*  FREET4 0.70  --   --    < > = values in this interval not displayed.   Anemia Panel: No results for input(s): "VITAMINB12", "FOLATE", "FERRITIN", "TIBC", "IRON", "RETICCTPCT" in the last 72 hours. Urine analysis:    Component Value Date/Time   COLORURINE AMBER (A) 05/11/2023 2012   APPEARANCEUR CLOUDY (A) 05/11/2023 2012   LABSPEC 1.005 05/11/2023 2012   PHURINE 7.0 05/11/2023 2012   GLUCOSEU NEGATIVE 05/11/2023 2012   HGBUR MODERATE (A) 05/11/2023 2012   BILIRUBINUR NEGATIVE 05/11/2023 2012   KETONESUR NEGATIVE 05/11/2023 2012   PROTEINUR 30 (A) 05/11/2023 2012   UROBILINOGEN 0.2 09/26/2013 1042   NITRITE NEGATIVE 05/11/2023 2012   LEUKOCYTESUR LARGE (A) 05/11/2023  2012   Sepsis Labs: @LABRCNTIP (procalcitonin:4,lacticidven:4)  ) Recent Results (from the past 240 hour(s))  Urine Culture     Status: Abnormal (Preliminary result)   Collection Time: 05/11/23  8:12 PM   Specimen: Urine, Random  Result Value Ref Range Status   Specimen Description   Final    URINE, RANDOM Performed at [facility], [address], [place], [place] [address]    Special Requests   Final    NONE Reflexed from [id number] Performed at [facility], [address] [place], [place] [address]    Culture (A)  Final    >=100,000 COLONIES/mL ESCHERICHIA COLI CULTURE REINCUBATED FOR BETTER GROWTH Performed at [facility], [address], [place], [place] [address]    Report Status PENDING  Incomplete   Organism ID, Bacteria ESCHERICHIA COLI (A)  Final      Susceptibility   Escherichia coli - MIC*    AMPICILLIN 8 SENSITIVE Sensitive     CEFAZOLIN <=4 SENSITIVE Sensitive     CEFEPIME <=0.12 SENSITIVE  Sensitive     CEFTRIAXONE <=0.25 SENSITIVE Sensitive     CIPROFLOXACIN <=0.25 SENSITIVE Sensitive     GENTAMICIN <=1 SENSITIVE Sensitive     IMIPENEM <=0.25 SENSITIVE Sensitive     NITROFURANTOIN <=16 SENSITIVE Sensitive     TRIMETH/SULFA <=20 SENSITIVE Sensitive     AMPICILLIN/SULBACTAM <=2 SENSITIVE Sensitive     PIP/TAZO <=4 SENSITIVE Sensitive ug/mL    * >=100,000 COLONIES/mL ESCHERICHIA COLI  Culture, blood (Routine X 2) w Reflex to ID Panel     Status: None (Preliminary result)   Collection Time: 05/12/23 10:38 AM   Specimen: BLOOD  Result Value Ref Range Status   Specimen Description BLOOD LEFT [facility]  Final   Special Requests   Final    BOTTLES DRAWN AEROBIC AND ANAEROBIC Blood Culture adequate volume   Culture   Final    NO GROWTH 2 DAYS Performed at [facility], [address] [place], [place] [address]    Report Status PENDING  Incomplete  Culture, blood (Routine X 2) w Reflex to ID Panel     Status: None (Preliminary result)   Collection Time: 05/12/23 10:38 AM   Specimen: BLOOD  Result Value Ref Range Status   Specimen Description BLOOD RIGHT ARM  Final   Special Requests   Final    BOTTLES DRAWN AEROBIC AND ANAEROBIC Blood Culture results may not be optimal due to an excessive volume of blood received in culture bottles   Culture   Final    NO GROWTH 2 DAYS Performed at [facility], [address] [place], [place] [address]    Report Status PENDING  Incomplete  MRSA Next Gen by PCR, Nasal     Status: None   Collection Time: 05/12/23  4:20 PM   Specimen: Nasal Mucosa; Nasal Swab  Result Value Ref Range Status   MRSA by PCR Next Gen NOT DETECTED NOT DETECTED Final    Comment: (NOTE) The GeneXpert MRSA Assay (FDA approved for NASAL specimens only), is one component of a comprehensive MRSA colonization surveillance program. It is not intended to diagnose MRSA infection nor to guide or monitor treatment for MRSA infections. Test performance is  not FDA approved in patients less than 80 years old. Performed at [facility], [address] [place], [place] [address]          Radiology Studies: No results found.      Scheduled Meds:  acyclovir  400 mg Oral BID   feeding supplement  237 mL Oral BID BM   levothyroxine  50 mcg Oral Q0600   midodrine  7.5 mg Oral TID WC   mouth rinse  15 mL Mouth Rinse 4 times per day   pantoprazole  40 mg Oral Daily   phosphorus  500 mg Oral Q4H   polyethylene glycol  17 g Oral Daily   sodium bicarbonate  650 mg Oral Daily   sodium chloride flush  3 mL Intravenous Q12H   Continuous Infusions:  sodium chloride 100 mL/hr at 05/14/23 0111   cefTRIAXone (ROCEPHIN)  IV 1 g (05/13/23 2248)     LOS: 2 days     Silvano Bilis, MD [facility]   If 7PM-7AM, please contact night-coverage [email or web address] Password [facility] 05/14/2023, 12:33 PM

## 2023-05-14 NOTE — Progress Notes (Signed)
PHARMACY CONSULT NOTE - ELECTROLYTES  Pharmacy Consult for Electrolyte Monitoring and Replacement   Recent Labs:   Estimated Creatinine Clearance: 11.1 mL/min (A) (by C-G formula based on SCr of 3.41 mg/dL (H)). Potassium (mmol/L)  Date Value  05/14/2023 3.4 (L)   Magnesium (mg/dL)  Date Value  16/05/9603 1.9   Calcium (mg/dL)  Date Value  54/04/8118 7.7 (L)   Albumin (g/dL)  Date Value  14/78/2956 <1.5 (L)  03/18/2021 3.2 (L)   Phosphorus (mg/dL)  Date Value  21/30/8657 <1.0 (LL)   Sodium (mmol/L)  Date Value  05/14/2023 136  02/23/2023 135   Corrected Ca: 10.0 mg/dL  Assessment  Kathy [name] is a 82 y.o. female with a past medical history significant for anemia, thrombocytopenia, currently on chemotherapy withDaratumumab SQ + Lenalidomide + Dexamethasone (DaraRd) q28d for multiple myeloma, peptic ulcer disease, GERD, hypertension, hypothyroidism, osteoporosis, obesity, sleep apnea, history of pancreatitis . Pharmacy has been consulted to monitor and replace electrolytes.  Diet: PO, DYS 3 MIVF: NS @ 100 mL/hr Pertinent medications: None  Goal of Therapy: Electrolytes WNL  Plan:  Kphos PO 500mg  X 3 doses Check BMP with AM labs  Thank you for allowing pharmacy to be a part of this patient's care.  [name], PharmD Clinical Pharmacist 05/14/2023 11:39 AM

## 2023-05-15 DIAGNOSIS — N179 Acute kidney failure, unspecified: Secondary | ICD-10-CM | POA: Diagnosis not present

## 2023-05-15 LAB — IRON AND TIBC
Iron: 70 ug/dL (ref 28–170)
Saturation Ratios: 63 % — ABNORMAL HIGH (ref 10.4–31.8)
TIBC: 111 ug/dL — ABNORMAL LOW (ref 250–450)
UIBC: 41 ug/dL

## 2023-05-15 LAB — CBC
HCT: 20 % — ABNORMAL LOW (ref 36.0–46.0)
Hemoglobin: 7.2 g/dL — ABNORMAL LOW (ref 12.0–15.0)
MCH: 33.8 pg (ref 26.0–34.0)
MCHC: 36 g/dL (ref 30.0–36.0)
MCV: 93.9 fL (ref 80.0–100.0)
Platelets: 53 10*3/uL — ABNORMAL LOW (ref 150–400)
RBC: 2.13 MIL/uL — ABNORMAL LOW (ref 3.87–5.11)
RDW: 18.9 % — ABNORMAL HIGH (ref 11.5–15.5)
WBC: 6.3 10*3/uL (ref 4.0–10.5)
nRBC: 0 % (ref 0.0–0.2)

## 2023-05-15 LAB — MAGNESIUM: Magnesium: 1.6 mg/dL — ABNORMAL LOW (ref 1.7–2.4)

## 2023-05-15 LAB — BASIC METABOLIC PANEL
Anion gap: 9 (ref 5–15)
BUN: 65 mg/dL — ABNORMAL HIGH (ref 8–23)
CO2: 19 mmol/L — ABNORMAL LOW (ref 22–32)
Calcium: 7.3 mg/dL — ABNORMAL LOW (ref 8.9–10.3)
Chloride: 112 mmol/L — ABNORMAL HIGH (ref 98–111)
Creatinine, Ser: 2.78 mg/dL — ABNORMAL HIGH (ref 0.44–1.00)
GFR, Estimated: 17 mL/min — ABNORMAL LOW (ref >60)
Glucose, Bld: 110 mg/dL — ABNORMAL HIGH (ref 70–99)
Potassium: 3.3 mmol/L — ABNORMAL LOW (ref 3.5–5.1)
Sodium: 140 mmol/L (ref 135–145)

## 2023-05-15 LAB — PHOSPHORUS: Phosphorus: 2.7 mg/dL (ref 2.5–4.6)

## 2023-05-15 LAB — FERRITIN: Ferritin: 559 ng/mL — ABNORMAL HIGH (ref 11–307)

## 2023-05-15 LAB — FOLATE: Folate: 10.4 ng/mL (ref >5.9)

## 2023-05-15 MED ORDER — POTASSIUM CHLORIDE 20 MEQ PO PACK
20.0000 meq | PACK | Freq: Once | ORAL | Status: AC
  Administered 2023-05-15: 20 meq via ORAL
  Filled 2023-05-15: qty 1

## 2023-05-15 MED ORDER — MAGNESIUM SULFATE 2 GM/50ML IV SOLN
2.0000 g | Freq: Once | INTRAVENOUS | Status: AC
  Administered 2023-05-15: 2 g via INTRAVENOUS
  Filled 2023-05-15: qty 50

## 2023-05-15 MED ORDER — HEPARIN SODIUM (PORCINE) 5000 UNIT/ML IJ SOLN
5000.0000 [IU] | Freq: Three times a day (TID) | INTRAMUSCULAR | Status: DC
  Administered 2023-05-15 – 2023-05-17 (×6): 5000 [IU] via SUBCUTANEOUS
  Filled 2023-05-15 (×6): qty 1

## 2023-05-15 NOTE — Plan of Care (Signed)
Problem: Fluid Volume: Goal: Hemodynamic stability will improve Outcome: Progressing   Problem: Clinical Measurements: Goal: Diagnostic test results will improve Outcome: Progressing Goal: Signs and symptoms of infection will decrease Outcome: Progressing   Problem: Respiratory: Goal: Ability to maintain adequate ventilation will improve Outcome: Progressing   Problem: Education: Goal: Knowledge of General Education information will improve Description: Including pain rating scale, medication(s)/side effects and non-pharmacologic comfort measures Outcome: Progressing   Problem: Urinary Elimination: Goal: Signs and symptoms of infection will decrease Outcome: Progressing

## 2023-05-15 NOTE — Progress Notes (Signed)
[facility]  ROUNDING NOTE   Subjective:   Patient seen sitting up in chair Alert and oriented x 2-3 Breakfast tray at bedside Denies nausea or vomiting Trace lower extremity edema  Creatinine none (3.41) (3.87)   Objective:  Vital signs in last 24 hours:  Temp:  [97.4 F (36.3 C)-98.6 F (37 C)] 97.4 F (36.3 C) (10/13 0808) Pulse Rate:  [84-97] 90 (10/13 0808) Resp:  [16-20] 18 (10/13 0808) BP: (122-134)/(73-86) 126/78 (10/13 0808) SpO2:  [98 %-100 %] 98 % (10/13 0808)  Weight change:  There were no vitals filed for this visit.  Intake/Output: I/O last 3 completed shifts: In: 3283 [P.O.:400; I.V.:2383; IV Piggyback:500] Out: -    Intake/Output this shift:  No intake/output data recorded.  Physical Exam: General: NAD  Head: Normocephalic, atraumatic. Moist oral mucosal membranes  Eyes: Anicteric,cachexia  Lungs:  Clear to auscultation, normal effort  Heart: Regular rate and rhythm  Abdomen:  Soft, nontender  Extremities:  No peripheral edema.  Neurologic: Alert, moving all four extremities  Skin: No lesions  Access: None    Basic Metabolic Panel: Recent Labs  Lab 05/10/23 1117 05/11/23 2114 05/12/23 0422 05/13/23 0312 05/14/23 0943  NA 130* 128* 130* 133* 136  K 2.5* 2.5* 4.4 3.6 3.4*  CL 93* 93* 99 101 110  CO2 25 21* 19* 19* 18*  GLUCOSE 97 108* 93 92 89  BUN 40* 57* 60* 70* 72*  CREATININE 3.27* 3.80* 3.75* 3.87* 3.41*  CALCIUM 9.2 8.6* 8.0* 8.0* 7.7*  MG 1.6* 2.1  --  1.9  --   PHOS  --   --   --  <1.0*  --     Liver Function Tests: Recent Labs  Lab 05/10/23 1117 05/11/23 2114  AST 19 20  ALT 13 14  ALKPHOS 134* 145*  BILITOT 0.8 0.9  PROT 8.9* 8.4*  ALBUMIN <1.5* <1.5*   Recent Labs  Lab 05/11/23 2114  LIPASE 17   Recent Labs  Lab 05/14/23 1914  AMMONIA 24    CBC: Recent Labs  Lab 05/10/23 1117 05/11/23 2043 05/13/23 0312 05/14/23 0943  WBC 6.3 7.3 6.6  6.6 7.1  NEUTROABS 5.3 5.1 5.8  --   HGB  8.7* 10.3* 8.0*  7.8* 7.8*  HCT 25.1* 29.3* 22.2*  20.8* 21.2*  MCV 98.0 95.1 92.5  89.3 91.8  PLT 111* 82* 50*  51* 48*    Cardiac Enzymes: No results for input(s): "CKTOTAL", "CKMB", "CKMBINDEX", "TROPONINI" in the last 168 hours.  BNP: Invalid input(s): "POCBNP"  CBG: No results for input(s): "GLUCAP" in the last 168 hours.  Microbiology: Results for orders placed or performed during the hospital encounter of 05/11/23  Urine Culture     Status: Abnormal (Preliminary result)   Collection Time: 05/11/23  8:12 PM   Specimen: Urine, Random  Result Value Ref Range Status   Specimen Description   Final    URINE, RANDOM Performed at [facility], [address] [place], [place] [address]    Special Requests   Final    NONE Reflexed from [id number] Performed at [facility], [address] [place], [place] [address]    Culture (A)  Final    >=100,000 COLONIES/mL ESCHERICHIA COLI CULTURE REINCUBATED FOR BETTER GROWTH Performed at [facility], [address] [place], [place] [address]    Report Status PENDING  Incomplete   Organism ID, Bacteria ESCHERICHIA COLI (A)  Final      Susceptibility  Escherichia coli - MIC*    AMPICILLIN 8 SENSITIVE Sensitive     CEFAZOLIN <=4 SENSITIVE Sensitive     CEFEPIME <=0.12 SENSITIVE Sensitive     CEFTRIAXONE <=0.25 SENSITIVE Sensitive     CIPROFLOXACIN <=0.25 SENSITIVE Sensitive     GENTAMICIN <=1 SENSITIVE Sensitive     IMIPENEM <=0.25 SENSITIVE Sensitive     NITROFURANTOIN <=16 SENSITIVE Sensitive     TRIMETH/SULFA <=20 SENSITIVE Sensitive     AMPICILLIN/SULBACTAM <=2 SENSITIVE Sensitive     PIP/TAZO <=4 SENSITIVE Sensitive ug/mL    * >=100,000 COLONIES/mL ESCHERICHIA COLI  Culture, blood (Routine X 2) w Reflex to ID Panel     Status: None (Preliminary result)   Collection Time: 05/12/23 10:38 AM   Specimen: BLOOD  Result Value Ref Range Status   Specimen Description BLOOD LEFT AC  Final   Special  Requests   Final    BOTTLES DRAWN AEROBIC AND ANAEROBIC Blood Culture adequate volume   Culture   Final    NO GROWTH 3 DAYS Performed at [facility], [address] [place], [place] [address]    Report Status PENDING  Incomplete  Culture, blood (Routine X 2) w Reflex to ID Panel     Status: None (Preliminary result)   Collection Time: 05/12/23 10:38 AM   Specimen: BLOOD  Result Value Ref Range Status   Specimen Description BLOOD RIGHT ARM  Final   Special Requests   Final    BOTTLES DRAWN AEROBIC AND ANAEROBIC Blood Culture results may not be optimal due to an excessive volume of blood received in culture bottles   Culture   Final    NO GROWTH 3 DAYS Performed at [facility], [address] [place], [place] [address]    Report Status PENDING  Incomplete  MRSA Next Gen by PCR, Nasal     Status: None   Collection Time: 05/12/23  4:20 PM   Specimen: Nasal Mucosa; Nasal Swab  Result Value Ref Range Status   MRSA by PCR Next Gen NOT DETECTED NOT DETECTED Final    Comment: (NOTE) The GeneXpert MRSA Assay (FDA approved for NASAL specimens only), is one component of a comprehensive MRSA colonization surveillance program. It is not intended to diagnose MRSA infection nor to guide or monitor treatment for MRSA infections. Test performance is not FDA approved in patients less than 63 years old. Performed at [facility], [address] [place], [place] [address]     Coagulation Studies: No results for input(s): "LABPROT", "INR" in the last 72 hours.   Urinalysis: No results for input(s): "COLORURINE", "LABSPEC", "PHURINE", "GLUCOSEU", "HGBUR", "BILIRUBINUR", "KETONESUR", "PROTEINUR", "UROBILINOGEN", "NITRITE", "LEUKOCYTESUR" in the last 72 hours.  Invalid input(s): "APPERANCEUR"     Imaging: MR BRAIN WO CONTRAST  Result Date: 05/14/2023 CLINICAL DATA:  Encephalopathy.  Multiple myeloma. EXAM: MRI HEAD WITHOUT CONTRAST TECHNIQUE: Multiplanar,  multiecho pulse sequences of the brain and surrounding structures were obtained without intravenous contrast. COMPARISON:  Head CT 05/11/2023.  MRI brain 03/11/2023. FINDINGS: Brain: No acute infarct or hemorrhage. Stable background of mild chronic small-vessel disease. Unchanged temporoparietal predominant atrophy pattern. No acute hydrocephalus or extra-axial collection. No mass or abnormal susceptibility. Vascular: Normal flow voids. Skull and upper cervical spine: Unchanged numerous small T2 hyperintense lesions throughout the skull, compatible with given history of multiple myeloma. Sinuses/Orbits: No acute findings. Other: None. IMPRESSION: 1. No acute intracranial process. 2. Unchanged numerous small T2 hyperintense lesions throughout the skull, compatible with given history of multiple  myeloma. Electronically Signed   By: Orvan Falconer M.D.   On: 05/14/2023 19:46     Medications:    sodium chloride 100 mL/hr at 05/15/23 0636   thiamine (VITAMIN B1) injection Stopped (05/14/23 2325)    acyclovir  400 mg Oral BID   cephALEXin  250 mg Oral QHS   feeding supplement  237 mL Oral BID BM   levothyroxine  68.5 mcg Oral Q0600   midodrine  7.5 mg Oral TID WC   mouth rinse  15 mL Mouth Rinse 4 times per day   pantoprazole  40 mg Oral Daily   polyethylene glycol  17 g Oral Daily   sodium bicarbonate  650 mg Oral BID   sodium chloride flush  3 mL Intravenous Q12H   acetaminophen **OR** acetaminophen, mouth rinse  Assessment/ Plan:  Kathy [name] is a 82 y.o.  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 Urine consistent with urinary tract infection.  -No updated labs today -Will place order for intake and output to monitor urine output. - No acute indication for dialysis.  - holding furosemide.   Lab Results  Component Value Date   CREATININE 3.41 (H) 05/14/2023   CREATININE 3.87 (H) 05/13/2023   CREATININE 3.75 (H) 05/12/2023    Intake/Output Summary (Last 24 hours) at 05/15/2023 0956 Last data filed at 05/15/2023 0636 Gross per 24 hour  Intake 1776.84 ml  Output --  Net 1776.84 ml   Hyponatremia: corrected   Acute metabolic acidosis: Remains on oral sodium bicarbonate.    Anemia with acute kidney injury and Thrombocytopenia: drop in numbers  Multiple Myeloma - appreciate hematology input.    6.   Urinary tract infection: E. Coli             -Completed IV ceftriaxone.   -Now receiving 7-day course of Keflex   LOS: 3   10/13/20249:56 AM

## 2023-05-15 NOTE — Progress Notes (Signed)
PROGRESS NOTE    Kathy [name]  AOZ:[id number] DOB: 1941/05/27 DOA: 05/11/2023 PCP: [name], MD  Outpatient Specialists: oncology    Brief Narrative:   Kathy [name] is a 82 y.o. female with medical history significant for history of anemia, thrombocytopenia, currently on chemotherapy withDaratumumab SQ + Lenalidomide + Dexamethasone (DaraRd) q28d for multiple myeloma, peptic ulcer disease, GERD, hypertension, hypothyroidism, osteoporosis, obesity, sleep apnea, history of pancreatitis, patient is a limited historian and does not give HPI it is per chart review and per nursing note.  Per report patient states that she does not feel well.    Assessment & Plan:   Principal Problem:   AKI (acute kidney injury) (HCC) Active Problems:   Essential hypertension   Acquired hypothyroidism   GERD (gastroesophageal reflux disease)   S/P shoulder replacement   Rheumatoid arthritis (HCC)   History of Roux-en-Y gastric bypass   Anemia   Multiple myeloma (HCC)   Sepsis secondary to UTI (HCC)   Electrolyte abnormality   Thrombocytopenia (HCC)   AMS (altered mental status)  # Encephalopathy Likely 2/2 uti and aki and multiple myeloma.  Brother says mental status has been declining for last two months since start of MM treatment.CT and MRI head neg, cxr nothing acute, no intra-abdominal acute pathology on CT of abdomen/pelvis, no signs liver dysfunction. Tsh mild elevation but t4 wnl. Corrected calcium wnl. Abx started prior to blood cultures. No report of seizure-like activity. Ammonia wnl.  Stable  - treat as below - monitor cultures - continue high-dose thiamine  # Acute cystitis Very poor historian currently but urinalysis is suggestive of infection. Is immune compromised. Culture growing 10^5 e coli, pan-sensitive - renally-dosed keflex to complete 7-day course  # Thrombocytopenia New, plts have trended down to 50s, suspect 2/2 recent MM diagnosis, did receive heparin  little more than a month ago at that hospitalization - will reach out to patient's oncologist - will discuss resuming heparin dvt ppx w/ onc  # AKI # Metabolic encephalopathy Baseline normal kidney function, here in the 3s where it has been for the last couple of days. CT stone protocol no signs obstruction. Home lasix may have contributed. Cr now improving, 2.78 today - continue fluids - hold home lasix - nephro following, no additional w/u has been advised - cont sodium bicarb  # Multiple myeloma Recent diagnosis, started on darzalex and dexamethasone - cont acyclovir  # Hx GI bleed 2/2 GJ ulcer. Hgb here stable from priors - monitor  # Hypothyroid Tsh elevated, t4 wnl - cont home synthroid but have increased dose from 50 to 68.5  # RA On weekly adalimumab at home  # Chronic hypotension - cont home midodrine  # End-of-life care - palliative consulted  DVT prophylaxis: SCDs Code Status: full Family Communication: sister-in-law updated @ bedside 10/13  Level of care: Med-Surg Status is: Inpatient Remains inpatient appropriate because: severity of illness    Consultants:  Nephrology, oncology  Procedures: none  Antimicrobials:  Ceftriaxone>keflex   Subjective: No complaints, tolerating diet  Objective: Vitals:   05/15/23 0411 05/15/23 0808 05/15/23 1210 05/15/23 1530  BP: 122/77 126/78 (!) 143/82 115/66  Pulse: 86 90 83 83  Resp: 16 18 19 18   Temp: (!) 97.4 F (36.3 C) (!) 97.4 F (36.3 C) 98.2 F (36.8 C) 98.3 F (36.8 C)  TempSrc: Axillary     SpO2: 99% 98% 100% 100%    Intake/Output Summary (Last 24 hours) at 05/15/2023 1603 Last data filed at  05/15/2023 0636 Gross per 24 hour  Intake 1175.41 ml  Output --  Net 1175.41 ml   There were no vitals filed for this visit.  Examination:  General exam: Appears calm and comfortable  Respiratory system: Clear to auscultation. Respiratory effort normal. Cardiovascular system: S1 & S2 heard,  RRR. No JVD, murmurs, rubs, gallops or clicks. No pedal edema. Gastrointestinal system: Abdomen is nondistended, soft and nontender. No organomegaly or masses felt.   Central nervous system: Alert and oriented to self, moving all 4 Extremities: Symmetric 5 x 5 power. Skin: No rashes, lesions or ulcers Psychiatry: calm and confused    Data Reviewed: I have personally reviewed following labs and imaging studies  CBC: Recent Labs  Lab 05/10/23 1117 05/11/23 2043 05/13/23 0312 05/14/23 0943 05/15/23 1404  WBC 6.3 7.3 6.6  6.6 7.1 6.3  NEUTROABS 5.3 5.1 5.8  --   --   HGB 8.7* 10.3* 8.0*  7.8* 7.8* 7.2*  HCT 25.1* 29.3* 22.2*  20.8* 21.2* 20.0*  MCV 98.0 95.1 92.5  89.3 91.8 93.9  PLT 111* 82* 50*  51* 48* 53*   Basic Metabolic Panel: Recent Labs  Lab 05/10/23 1117 05/11/23 2114 05/12/23 0422 05/13/23 0312 05/14/23 0943 05/15/23 1404  NA 130* 128* 130* 133* 136 140  K 2.5* 2.5* 4.4 3.6 3.4* 3.3*  CL 93* 93* 99 101 110 112*  CO2 25 21* 19* 19* 18* 19*  GLUCOSE 97 108* 93 92 89 110*  BUN 40* 57* 60* 70* 72* 65*  CREATININE 3.27* 3.80* 3.75* 3.87* 3.41* 2.78*  CALCIUM 9.2 8.6* 8.0* 8.0* 7.7* 7.3*  MG 1.6* 2.1  --  1.9  --  1.6*  PHOS  --   --   --  <1.0*  --  2.7   GFR: Estimated Creatinine Clearance: 13.6 mL/min (A) (by C-G formula based on SCr of 2.78 mg/dL (H)). Liver Function Tests: Recent Labs  Lab 05/10/23 1117 05/11/23 2114  AST 19 20  ALT 13 14  ALKPHOS 134* 145*  BILITOT 0.8 0.9  PROT 8.9* 8.4*  ALBUMIN <1.5* <1.5*   Recent Labs  Lab 05/11/23 2114  LIPASE 17   Recent Labs  Lab 05/14/23 1914  AMMONIA 24   Coagulation Profile: Recent Labs  Lab 05/12/23 0527  INR 1.1   Cardiac Enzymes: No results for input(s): "CKTOTAL", "CKMB", "CKMBINDEX", "TROPONINI" in the last 168 hours. BNP (last 3 results) No results for input(s): "PROBNP" in the last 8760 hours. HbA1C: No results for input(s): "HGBA1C" in the last 72 hours. CBG: No results  for input(s): "GLUCAP" in the last 168 hours. Lipid Profile: No results for input(s): "CHOL", "HDL", "LDLCALC", "TRIG", "CHOLHDL", "LDLDIRECT" in the last 72 hours. Thyroid Function Tests: Recent Labs    05/13/23 0312  TSH 13.805*   Anemia Panel: Recent Labs    05/14/23 0941 05/15/23 0942  VITAMINB12 1,734*  --   FOLATE  --  10.4  FERRITIN  --  559*  TIBC  --  111*  IRON  --  70   Urine analysis:    Component Value Date/Time   COLORURINE AMBER (A) 05/11/2023 2012   APPEARANCEUR CLOUDY (A) 05/11/2023 2012   LABSPEC 1.005 05/11/2023 2012   PHURINE 7.0 05/11/2023 2012   GLUCOSEU NEGATIVE 05/11/2023 2012   HGBUR MODERATE (A) 05/11/2023 2012   BILIRUBINUR NEGATIVE 05/11/2023 2012   KETONESUR NEGATIVE 05/11/2023 2012   PROTEINUR 30 (A) 05/11/2023 2012   UROBILINOGEN 0.2 09/26/2013 1042   NITRITE NEGATIVE 05/11/2023 2012  LEUKOCYTESUR LARGE (A) 05/11/2023 2012   Sepsis Labs: @LABRCNTIP (procalcitonin:4,lacticidven:4)  ) Recent Results (from the past 240 hour(s))  Urine Culture     Status: Abnormal (Preliminary result)   Collection Time: 05/11/23  8:12 PM   Specimen: Urine, Random  Result Value Ref Range Status   Specimen Description   Final    URINE, RANDOM Performed at [facility], [address] [place], [place] [address]    Special Requests   Final    NONE Reflexed from [id number] Performed at [facility], [address] [place], [place] [address]    Culture (A)  Final    >=100,000 COLONIES/mL ESCHERICHIA COLI 60,000 COLONIES/mL PSEUDOMONAS AERUGINOSA SUSCEPTIBILITIES TO FOLLOW Performed at [facility], [address], [place], [place] [address]    Report Status PENDING  Incomplete   Organism ID, Bacteria ESCHERICHIA COLI (A)  Final      Susceptibility   Escherichia coli - MIC*    AMPICILLIN 8 SENSITIVE Sensitive     CEFAZOLIN <=4 SENSITIVE Sensitive     CEFEPIME <=0.12 SENSITIVE Sensitive     CEFTRIAXONE <=0.25 SENSITIVE  Sensitive     CIPROFLOXACIN <=0.25 SENSITIVE Sensitive     GENTAMICIN <=1 SENSITIVE Sensitive     IMIPENEM <=0.25 SENSITIVE Sensitive     NITROFURANTOIN <=16 SENSITIVE Sensitive     TRIMETH/SULFA <=20 SENSITIVE Sensitive     AMPICILLIN/SULBACTAM <=2 SENSITIVE Sensitive     PIP/TAZO <=4 SENSITIVE Sensitive ug/mL    * >=100,000 COLONIES/mL ESCHERICHIA COLI  Culture, blood (Routine X 2) w Reflex to ID Panel     Status: None (Preliminary result)   Collection Time: 05/12/23 10:38 AM   Specimen: BLOOD  Result Value Ref Range Status   Specimen Description BLOOD LEFT [facility]  Final   Special Requests   Final    BOTTLES DRAWN AEROBIC AND ANAEROBIC Blood Culture adequate volume   Culture   Final    NO GROWTH 3 DAYS Performed at [facility], [address] [place], [place] [address]    Report Status PENDING  Incomplete  Culture, blood (Routine X 2) w Reflex to ID Panel     Status: None (Preliminary result)   Collection Time: 05/12/23 10:38 AM   Specimen: BLOOD  Result Value Ref Range Status   Specimen Description BLOOD RIGHT ARM  Final   Special Requests   Final    BOTTLES DRAWN AEROBIC AND ANAEROBIC Blood Culture results may not be optimal due to an excessive volume of blood received in culture bottles   Culture   Final    NO GROWTH 3 DAYS Performed at [facility], [address] [place], [place] [address]    Report Status PENDING  Incomplete  MRSA Next Gen by PCR, Nasal     Status: None   Collection Time: 05/12/23  4:20 PM   Specimen: Nasal Mucosa; Nasal Swab  Result Value Ref Range Status   MRSA by PCR Next Gen NOT DETECTED NOT DETECTED Final    Comment: (NOTE) The GeneXpert MRSA Assay (FDA approved for NASAL specimens only), is one component of a comprehensive MRSA colonization surveillance program. It is not intended to diagnose MRSA infection nor to guide or monitor treatment for MRSA infections. Test performance is not FDA approved in patients less than 18  years old. Performed at [facility], [address], [place], [place] [address]          Radiology Studies: MR BRAIN WO CONTRAST  Result  Date: 05/14/2023 CLINICAL DATA:  Encephalopathy.  Multiple myeloma. EXAM: MRI HEAD WITHOUT CONTRAST TECHNIQUE: Multiplanar, multiecho pulse sequences of the brain and surrounding structures were obtained without intravenous contrast. COMPARISON:  Head CT 05/11/2023.  MRI brain 03/11/2023. FINDINGS: Brain: No acute infarct or hemorrhage. Stable background of mild chronic small-vessel disease. Unchanged temporoparietal predominant atrophy pattern. No acute hydrocephalus or extra-axial collection. No mass or abnormal susceptibility. Vascular: Normal flow voids. Skull and upper cervical spine: Unchanged numerous small T2 hyperintense lesions throughout the skull, compatible with given history of multiple myeloma. Sinuses/Orbits: No acute findings. Other: None. IMPRESSION: 1. No acute intracranial process. 2. Unchanged numerous small T2 hyperintense lesions throughout the skull, compatible with given history of multiple myeloma. Electronically Signed   By: Orvan Falconer M.D.   On: 05/14/2023 19:46        Scheduled Meds:  acyclovir  400 mg Oral BID   cephALEXin  250 mg Oral QHS   feeding supplement  237 mL Oral BID BM   levothyroxine  68.5 mcg Oral Q0600   midodrine  7.5 mg Oral TID WC   mouth rinse  15 mL Mouth Rinse 4 times per day   pantoprazole  40 mg Oral Daily   polyethylene glycol  17 g Oral Daily   potassium chloride  20 mEq Oral Once   sodium bicarbonate  650 mg Oral BID   sodium chloride flush  3 mL Intravenous Q12H   Continuous Infusions:  sodium chloride 100 mL/hr at 05/15/23 1326   magnesium sulfate bolus IVPB     thiamine (VITAMIN B1) injection 500 mg (05/15/23 1040)     LOS: 3 days     Silvano Bilis, MD [facility]   If 7PM-7AM, please contact night-coverage [email or web address] Password [email or web address] 05/15/2023, 4:03 PM

## 2023-05-15 NOTE — Progress Notes (Signed)
PHARMACY CONSULT NOTE - ELECTROLYTES  Pharmacy Consult for Electrolyte Monitoring and Replacement   Recent Labs:   Estimated Creatinine Clearance: 13.6 mL/min (A) (by C-G formula based on SCr of 2.78 mg/dL (H)). Potassium (mmol/L)  Date Value  05/15/2023 3.3 (L)   Magnesium (mg/dL)  Date Value  60/45/4098 1.6 (L)   Calcium (mg/dL)  Date Value  11/91/4782 7.3 (L)   Albumin (g/dL)  Date Value  95/62/1308 <1.5 (L)  03/18/2021 3.2 (L)   Phosphorus (mg/dL)  Date Value  65/78/4696 2.7   Sodium (mmol/L)  Date Value  05/15/2023 140  02/23/2023 135   Corrected Ca: 10.0 mg/dL  Assessment  Kathy [name] is a 82 y.o. female with a past medical history significant for anemia, thrombocytopenia, currently on chemotherapy withDaratumumab SQ + Lenalidomide + Dexamethasone (DaraRd) q28d for multiple myeloma, peptic ulcer disease, GERD, hypertension, hypothyroidism, osteoporosis, obesity, sleep apnea, history of pancreatitis . Pharmacy has been consulted to monitor and replace electrolytes.  Diet: DYS 3 MIVF: NS @ 100 mL/hr  Goal of Therapy: Electrolytes WNL  Plan:  Mg 1.6, give Mg Sulfate 2g IV x1 K 3.3, give KCl 20 mEq x1 Check BMP, Mg with AM labs  Thank you for allowing pharmacy to be a part of this patient's care.  Celene Squibb, PharmD Clinical Pharmacist 05/15/2023 3:15 PM

## 2023-05-16 ENCOUNTER — Other Ambulatory Visit: Payer: Self-pay | Admitting: Internal Medicine

## 2023-05-16 ENCOUNTER — Other Ambulatory Visit: Payer: Self-pay

## 2023-05-16 DIAGNOSIS — N179 Acute kidney failure, unspecified: Secondary | ICD-10-CM | POA: Diagnosis not present

## 2023-05-16 DIAGNOSIS — C9000 Multiple myeloma not having achieved remission: Secondary | ICD-10-CM

## 2023-05-16 LAB — BASIC METABOLIC PANEL
Anion gap: 12 (ref 5–15)
BUN: 59 mg/dL — ABNORMAL HIGH (ref 8–23)
CO2: 19 mmol/L — ABNORMAL LOW (ref 22–32)
Calcium: 7 mg/dL — ABNORMAL LOW (ref 8.9–10.3)
Chloride: 115 mmol/L — ABNORMAL HIGH (ref 98–111)
Creatinine, Ser: 2.44 mg/dL — ABNORMAL HIGH (ref 0.44–1.00)
GFR, Estimated: 19 mL/min — ABNORMAL LOW (ref >60)
Glucose, Bld: 83 mg/dL (ref 70–99)
Potassium: 3.5 mmol/L (ref 3.5–5.1)
Sodium: 146 mmol/L — ABNORMAL HIGH (ref 135–145)

## 2023-05-16 LAB — RETICULOCYTES
Immature Retic Fract: 13.5 % (ref 2.3–15.9)
RBC.: 1.89 MIL/uL — ABNORMAL LOW (ref 3.87–5.11)
Retic Count, Absolute: 12.5 10*3/uL — ABNORMAL LOW (ref 19.0–186.0)
Retic Ct Pct: 0.7 % (ref 0.4–3.1)

## 2023-05-16 LAB — URINE CULTURE: Culture: >=100000 — AB

## 2023-05-16 LAB — MAGNESIUM: Magnesium: 1.9 mg/dL (ref 1.7–2.4)

## 2023-05-16 MED ORDER — DEXTROSE-SODIUM CHLORIDE 5-0.45 % IV SOLN: INTRAVENOUS | Status: AC

## 2023-05-16 NOTE — TOC Progression Note (Signed)
Transition of Care [facility]) - Progression Note    Patient Details  Name: Kathy [name] MRN: [id number] Date of Birth: Feb 07, 1941  [facility] of [facility] CM/SW Contact  Allena Katz, LCSW Phone Number: 05/16/2023, 9:50 AM  Clinical Narrative:   Per MD, pt still not medically ready to discharge to SNF due to kidney function may be ready in 1-3 days.     Expected Discharge Plan: Skilled Nursing Facility Barriers to Discharge: Continued Medical Work up  Expected Discharge Plan and Services     Post Acute Care Choice: Skilled Nursing Facility                                         Social Determinants of Health (SDOH) Interventions SDOH Screenings   Food Insecurity: Patient Unable To Answer (05/12/2023)  Housing: High Risk (05/12/2023)  Transportation Needs: Patient Unable To Answer (05/12/2023)  Utilities: Patient Unable To Answer (05/12/2023)  Alcohol Screen: Low Risk  (09/14/2021)  Depression (PHQ2-9): Low Risk  (03/30/2023)  Financial Resource Strain: Low Risk  (09/14/2021)  Physical Activity: Sufficiently Active (09/14/2021)  Social Connections: Moderately Isolated (09/14/2021)  Stress: No Stress Concern Present (09/14/2021)  Tobacco Use: Low Risk  (05/12/2023)    Readmission Risk Interventions    05/12/2023    1:07 PM 03/16/2023   10:32 AM  Readmission Risk Prevention Plan  Transportation Screening Complete Complete  PCP or Specialist Appt within 3-5 Days  Complete  HRI or Home Care Consult  Complete  Social Work Consult for Recovery Care Planning/Counseling  Complete  Palliative Care Screening  Not Applicable  Medication Review Oceanographer)  Complete  PCP or Specialist appointment within 3-5 days of discharge Complete   HRI or Home Care Consult Complete   SW Recovery Care/Counseling Consult Complete   Palliative Care Screening Not Applicable   Skilled Nursing Facility Complete

## 2023-05-16 NOTE — Progress Notes (Signed)
PROGRESS NOTE    Franceen Mccannon Sayler  YQI:[id number] DOB: 1941-01-09 DOA: 05/11/2023 PCP: [name], MD  Outpatient Specialists: oncology    Brief Narrative:   Kathy [name] is a 82 y.o. female with medical history significant for history of anemia, thrombocytopenia, currently on chemotherapy withDaratumumab SQ + Lenalidomide + Dexamethasone (DaraRd) q28d for multiple myeloma, peptic ulcer disease, GERD, hypertension, hypothyroidism, osteoporosis, obesity, sleep apnea, history of pancreatitis, patient is a limited historian and does not give HPI it is per chart review and per nursing note.  Per report patient states that she does not feel well.    Assessment & Plan:   Principal Problem:   AKI (acute kidney injury) (HCC) Active Problems:   Essential hypertension   Acquired hypothyroidism   GERD (gastroesophageal reflux disease)   S/P shoulder replacement   Rheumatoid arthritis (HCC)   History of Roux-en-Y gastric bypass   Anemia   Multiple myeloma (HCC)   Sepsis secondary to UTI (HCC)   Electrolyte abnormality   Thrombocytopenia (HCC)   AMS (altered mental status)  # Encephalopathy Likely 2/2 uti and aki and multiple myeloma.  Brother says mental status has been declining for last two months since start of MM treatment.CT and MRI head neg, cxr nothing acute, no intra-abdominal acute pathology on CT of abdomen/pelvis, no signs liver dysfunction. Tsh mild elevation but t4 wnl. Corrected calcium wnl. Abx started prior to blood cultures. No report of seizure-like activity. Ammonia wnl.  Slowly improving, family agrees  - treat as below - monitor cultures - continue high-dose thiamine  # Acute cystitis Very poor historian currently but urinalysis is suggestive of infection. Is immune compromised. Culture growing 10^5 e coli, pan-sensitive - renally-dosed keflex to complete 7-day course  # Thrombocytopenia New, plts have trended down to 50s, suspect 2/2 recent MM  diagnosis, did receive heparin little more than a month ago at that hospitalization. Onc agrees this is MM, ok to resume heparin   # AKI # Metabolic encephalopathy Baseline normal kidney function, here in the 3s. CT stone protocol no signs obstruction. Home lasix may have contributed. Cr now improving, 2.44 today - continue fluids - hold home lasix - nephro following, no additional w/u has been advised - cont sodium bicarb  # Multiple myeloma Recent diagnosis, started on darzalex and dexamethasone - cont acyclovir  # Hx GI bleed 2/2 GJ ulcer. Hgb here stable from priors - monitor  # Hypothyroid Tsh elevated, t4 wnl - cont home synthroid but have increased dose from 50 to 68.5  # RA On weekly adalimumab at home  # Chronic hypotension - cont home midodrine  # End-of-life care - palliative consulted  DVT prophylaxis: heparin Code Status: full Family Communication: sister-in-law updated @ bedside 10/14  Level of care: Med-Surg Status is: Inpatient Remains inpatient appropriate because: severity of illness    Consultants:  Nephrology, oncology  Procedures: none  Antimicrobials:  Ceftriaxone>keflex   Subjective: No complaints, tolerating diet, had bm yesterday  Objective: Vitals:   05/15/23 2007 05/16/23 0003 05/16/23 0355 05/16/23 0743  BP: 117/66 129/73 127/80 131/78  Pulse: 84 86 81 80  Resp: 18 17 18 20   Temp: 98 F (36.7 C) 97.8 F (36.6 C) 98 F (36.7 C) (!) 97.5 F (36.4 C)  TempSrc:      SpO2: 97% 99% 100% 98%    Intake/Output Summary (Last 24 hours) at 05/16/2023 1522 Last data filed at 05/16/2023 0557 Gross per 24 hour  Intake 1841.4 ml  Output --  Net 1841.4 ml   There were no vitals filed for this visit.  Examination:  General exam: Appears calm and comfortable  Respiratory system: Clear to auscultation. Respiratory effort normal. Cardiovascular system: S1 & S2 heard, RRR. No JVD, murmurs, rubs, gallops or clicks.    Gastrointestinal system: Abdomen is nondistended, soft and nontender. No organomegaly or masses felt.   Central nervous system: Alert and oriented to self, moving all 4 Extremities: Symmetric 5 x 5 power. warm Skin: No rashes, lesions or ulcers Psychiatry: calm and confused    Data Reviewed: I have personally reviewed following labs and imaging studies  CBC: Recent Labs  Lab 05/10/23 1117 05/11/23 2043 05/13/23 0312 05/14/23 0943 05/15/23 1404  WBC 6.3 7.3 6.6  6.6 7.1 6.3  NEUTROABS 5.3 5.1 5.8  --   --   HGB 8.7* 10.3* 8.0*  7.8* 7.8* 7.2*  HCT 25.1* 29.3* 22.2*  20.8* 21.2* 20.0*  MCV 98.0 95.1 92.5  89.3 91.8 93.9  PLT 111* 82* 50*  51* 48* 53*   Basic Metabolic Panel: Recent Labs  Lab 05/10/23 1117 05/11/23 2114 05/12/23 0422 05/13/23 0312 05/14/23 0943 05/15/23 1404 05/16/23 0454  NA 130* 128* 130* 133* 136 140 146*  K 2.5* 2.5* 4.4 3.6 3.4* 3.3* 3.5  CL 93* 93* 99 101 110 112* 115*  CO2 25 21* 19* 19* 18* 19* 19*  GLUCOSE 97 108* 93 92 89 110* 83  BUN 40* 57* 60* 70* 72* 65* 59*  CREATININE 3.27* 3.80* 3.75* 3.87* 3.41* 2.78* 2.44*  CALCIUM 9.2 8.6* 8.0* 8.0* 7.7* 7.3* 7.0*  MG 1.6* 2.1  --  1.9  --  1.6* 1.9  PHOS  --   --   --  <1.0*  --  2.7  --    GFR: Estimated Creatinine Clearance: 15.5 mL/min (A) (by C-G formula based on SCr of 2.44 mg/dL (H)). Liver Function Tests: Recent Labs  Lab 05/10/23 1117 05/11/23 2114  AST 19 20  ALT 13 14  ALKPHOS 134* 145*  BILITOT 0.8 0.9  PROT 8.9* 8.4*  ALBUMIN <1.5* <1.5*   Recent Labs  Lab 05/11/23 2114  LIPASE 17   Recent Labs  Lab 05/14/23 1914  AMMONIA 24   Coagulation Profile: Recent Labs  Lab 05/12/23 0527  INR 1.1   Cardiac Enzymes: No results for input(s): "CKTOTAL", "CKMB", "CKMBINDEX", "TROPONINI" in the last 168 hours. BNP (last 3 results) No results for input(s): "PROBNP" in the last 8760 hours. HbA1C: No results for input(s): "HGBA1C" in the last 72 hours. CBG: No  results for input(s): "GLUCAP" in the last 168 hours. Lipid Profile: No results for input(s): "CHOL", "HDL", "LDLCALC", "TRIG", "CHOLHDL", "LDLDIRECT" in the last 72 hours. Thyroid Function Tests: No results for input(s): "TSH", "T4TOTAL", "FREET4", "T3FREE", "THYROIDAB" in the last 72 hours.  Anemia Panel: Recent Labs    05/14/23 0941 05/15/23 0942 05/16/23 0454  VITAMINB12 1,734*  --   --   FOLATE  --  10.4  --   FERRITIN  --  559*  --   TIBC  --  111*  --   IRON  --  70  --   RETICCTPCT  --   --  0.7   Urine analysis:    Component Value Date/Time   COLORURINE AMBER (A) 05/11/2023 2012   APPEARANCEUR CLOUDY (A) 05/11/2023 2012   LABSPEC 1.005 05/11/2023 2012   PHURINE 7.0 05/11/2023 2012   GLUCOSEU NEGATIVE 05/11/2023 2012   HGBUR MODERATE (A) 05/11/2023 2012  BILIRUBINUR NEGATIVE 05/11/2023 2012   KETONESUR NEGATIVE 05/11/2023 2012   PROTEINUR 30 (A) 05/11/2023 2012   UROBILINOGEN 0.2 09/26/2013 1042   NITRITE NEGATIVE 05/11/2023 2012   LEUKOCYTESUR LARGE (A) 05/11/2023 2012   Sepsis Labs: @LABRCNTIP (procalcitonin:4,lacticidven:4)  ) Recent Results (from the past 240 hour(s))  Urine Culture     Status: Abnormal   Collection Time: 05/11/23  8:12 PM   Specimen: Urine, Random  Result Value Ref Range Status   Specimen Description   Final    URINE, RANDOM Performed at [facility], [address], [place], [place] [address]    Special Requests   Final    NONE Reflexed from [id number] Performed at [facility], [address] [place], [place] [address]    Culture (A)  Final    >=100,000 COLONIES/mL ESCHERICHIA COLI 60,000 COLONIES/mL PSEUDOMONAS AERUGINOSA    Report Status 05/16/2023 FINAL  Final   Organism ID, Bacteria ESCHERICHIA COLI (A)  Final   Organism ID, Bacteria PSEUDOMONAS AERUGINOSA (A)  Final      Susceptibility   Escherichia coli - MIC*    AMPICILLIN 8 SENSITIVE Sensitive     CEFAZOLIN <=4 SENSITIVE Sensitive     CEFEPIME  <=0.12 SENSITIVE Sensitive     CEFTRIAXONE <=0.25 SENSITIVE Sensitive     CIPROFLOXACIN <=0.25 SENSITIVE Sensitive     GENTAMICIN <=1 SENSITIVE Sensitive     IMIPENEM <=0.25 SENSITIVE Sensitive     NITROFURANTOIN <=16 SENSITIVE Sensitive     TRIMETH/SULFA <=20 SENSITIVE Sensitive     AMPICILLIN/SULBACTAM <=2 SENSITIVE Sensitive     PIP/TAZO <=4 SENSITIVE Sensitive ug/mL    * >=100,000 COLONIES/mL ESCHERICHIA COLI   Pseudomonas aeruginosa - MIC*    CEFTAZIDIME <=1 SENSITIVE Sensitive     CIPROFLOXACIN <=0.25 SENSITIVE Sensitive     GENTAMICIN <=1 SENSITIVE Sensitive     IMIPENEM 1 SENSITIVE Sensitive     PIP/TAZO <=4 SENSITIVE Sensitive ug/mL    CEFEPIME 0.25 SENSITIVE Sensitive     * 60,000 COLONIES/mL PSEUDOMONAS AERUGINOSA  Culture, blood (Routine X 2) w Reflex to ID Panel     Status: None (Preliminary result)   Collection Time: 05/12/23 10:38 AM   Specimen: BLOOD  Result Value Ref Range Status   Specimen Description BLOOD LEFT AC  Final   Special Requests   Final    BOTTLES DRAWN AEROBIC AND ANAEROBIC Blood Culture adequate volume   Culture   Final    NO GROWTH 4 DAYS Performed at [facility], [address], [place], [place] [address]    Report Status PENDING  Incomplete  Culture, blood (Routine X 2) w Reflex to ID Panel     Status: None (Preliminary result)   Collection Time: 05/12/23 10:38 AM   Specimen: BLOOD  Result Value Ref Range Status   Specimen Description BLOOD RIGHT ARM  Final   Special Requests   Final    BOTTLES DRAWN AEROBIC AND ANAEROBIC Blood Culture results may not be optimal due to an excessive volume of blood received in culture bottles   Culture   Final    NO GROWTH 4 DAYS Performed at [facility], [address] [place], [place] [address]    Report Status PENDING  Incomplete  MRSA Next Gen by PCR, Nasal     Status: None   Collection Time: 05/12/23  4:20 PM   Specimen: Nasal Mucosa; Nasal Swab  Result Value Ref Range Status    MRSA by PCR Next Gen NOT DETECTED NOT  DETECTED Final    Comment: (NOTE) The GeneXpert MRSA Assay (FDA approved for NASAL specimens only), is one component of a comprehensive MRSA colonization surveillance program. It is not intended to diagnose MRSA infection nor to guide or monitor treatment for MRSA infections. Test performance is not FDA approved in patients less than 23 years old. Performed at [facility], [address], [place], [place] [address]          Radiology Studies: MR BRAIN WO CONTRAST  Result Date: 05/14/2023 CLINICAL DATA:  Encephalopathy.  Multiple myeloma. EXAM: MRI HEAD WITHOUT CONTRAST TECHNIQUE: Multiplanar, multiecho pulse sequences of the brain and surrounding structures were obtained without intravenous contrast. COMPARISON:  Head CT 05/11/2023.  MRI brain 03/11/2023. FINDINGS: Brain: No acute infarct or hemorrhage. Stable background of mild chronic small-vessel disease. Unchanged temporoparietal predominant atrophy pattern. No acute hydrocephalus or extra-axial collection. No mass or abnormal susceptibility. Vascular: Normal flow voids. Skull and upper cervical spine: Unchanged numerous small T2 hyperintense lesions throughout the skull, compatible with given history of multiple myeloma. Sinuses/Orbits: No acute findings. Other: None. IMPRESSION: 1. No acute intracranial process. 2. Unchanged numerous small T2 hyperintense lesions throughout the skull, compatible with given history of multiple myeloma. Electronically Signed   By: Orvan Falconer M.D.   On: 05/14/2023 19:46        Scheduled Meds:  acyclovir  400 mg Oral BID   cephALEXin  250 mg Oral QHS   feeding supplement  237 mL Oral BID BM   heparin  5,000 Units Subcutaneous Q8H   levothyroxine  68.5 mcg Oral Q0600   midodrine  7.5 mg Oral TID WC   mouth rinse  15 mL Mouth Rinse 4 times per day   pantoprazole  40 mg Oral Daily   polyethylene glycol  17 g Oral Daily   sodium bicarbonate   650 mg Oral BID   sodium chloride flush  3 mL Intravenous Q12H   Continuous Infusions:  dextrose 5 % and 0.45 % NaCl 75 mL/hr at 05/16/23 0900   thiamine (VITAMIN B1) injection 500 mg (05/16/23 0857)     LOS: 4 days     Silvano Bilis, MD [facility]   If 7PM-7AM, please contact night-coverage [email or web address] Password [email or web address] 05/16/2023, 3:22 PM

## 2023-05-16 NOTE — Plan of Care (Signed)
Problem: Education: Goal: Knowledge of General Education information will improve Description: Including pain rating scale, medication(s)/side effects and non-pharmacologic comfort measures Outcome: Progressing   Problem: Urinary Elimination: Goal: Signs and symptoms of infection will decrease Outcome: Progressing

## 2023-05-16 NOTE — Progress Notes (Signed)
Kathy [name]   DOB:May 10, 1941   ZO#:[id number]    Subjective: Patient resting in the bed.  Denies any pain.  Appears more lucid today.  No nausea no vomiting.  Objective:  Vitals:   05/16/23 1614 05/16/23 1929  BP: 133/76 132/75  Pulse: 88 92  Resp: 20 18  Temp: 98.3 F (36.8 C) 98.1 F (36.7 C)  SpO2: 98% 100%     Intake/Output Summary (Last 24 hours) at 05/16/2023 1933 Last data filed at 05/16/2023 1535 Gross per 24 hour  Intake 1406.98 ml  Output --  Net 1406.98 ml    Physical Exam Vitals and nursing note reviewed.  HENT:     Head: Normocephalic and atraumatic.     Mouth/Throat:     Pharynx: Oropharynx is clear.  Eyes:     Extraocular Movements: Extraocular movements intact.     Pupils: Pupils are equal, round, and reactive to light.  Cardiovascular:     Rate and Rhythm: Normal rate and regular rhythm.  Pulmonary:     Comments: Decreased breath sounds bilaterally.  Abdominal:     Palpations: Abdomen is soft.  Musculoskeletal:        General: Normal range of motion.     Cervical back: Normal range of motion.  Skin:    General: Skin is warm.  Neurological:     General: No focal deficit present.     Mental Status: She is alert and oriented to person, place, and time.  Psychiatric:        Behavior: Behavior normal.        Judgment: Judgment normal.      Labs:  Lab Results  Component Value Date   WBC 6.3 05/15/2023   HGB 7.2 (L) 05/15/2023   HCT 20.0 (L) 05/15/2023   MCV 93.9 05/15/2023   PLT 53 (L) 05/15/2023   NEUTROABS 5.8 05/13/2023    Lab Results  Component Value Date   NA 146 (H) 05/16/2023   K 3.5 05/16/2023   CL 115 (H) 05/16/2023   CO2 19 (L) 05/16/2023    Studies:  No results found.  Kathy [name] 82 y.o.  female pleasant patient with a with multiple medical problems including rheumatoid arthritis and recent diagnosis of multiple myeloma currently on treatment is currently admitted to hospital for generalized weakness mental  status changes.  Patient diagnosed with UTI and also acute renal failure.   #  IgG kappa multiple myeloma [Presentation with malignant hypercalcemia since 01/23/2023; Dr.Kattragadda, Kathy [name]]-patient currently on Darzalex and dexamethasone.  Most recent dose of Darzalex -dex appx 1 week ago.  Awaiting repeat myeloma panel today.  If patient has significant elevation of kappa lambda light chain ratio or elevated M protein-inpatient bortezomib could be considered.   # Acute renal failure/severe electrolyte imbalance-multifactorial/UTI-question progression of multiple myeloma-given the significant lapses in the treatment.    # Anemia/thrombocytopenia likely secondary to multiple myeloma-acute renal failure/underlying sepsis.  No concern for any microangiopathic hemolytic anemia.  Review of smear shows no evidence of any schistocytes.  Recommend blood transfusion 1 unit if hemoglobin continues to be less than 8.  No evidence of any iron deficiency.  # UTI-E. coli on Rocephin.  # Mental status changes delirium-improving.   # Severe malnutrition: Continue nutrition support.   Earna Coder, MD 05/16/2023  7:33 PM

## 2023-05-16 NOTE — Progress Notes (Signed)
Lab orders entered

## 2023-05-16 NOTE — Progress Notes (Signed)
[facility]  ROUNDING NOTE   Subjective:   Patient sitting up in bed Breakfast tray partially eaten Denies pain or discomfort  Creatinine 2.48   Objective:  Vital signs in last 24 hours:  Temp:  [97.5 F (36.4 C)-98.3 F (36.8 C)] 97.5 F (36.4 C) (10/14 0743) Pulse Rate:  [80-86] 80 (10/14 0743) Resp:  [17-20] 20 (10/14 0743) BP: (115-143)/(66-82) 131/78 (10/14 0743) SpO2:  [97 %-100 %] 98 % (10/14 0743)  Weight change:  There were no vitals filed for this visit.  Intake/Output: I/O last 3 completed shifts: In: 3206.8 [P.O.:380; I.V.:2570.6; IV Piggyback:256.3] Out: -    Intake/Output this shift:  No intake/output data recorded.  Physical Exam: General: NAD  Head: Normocephalic, atraumatic. Moist oral mucosal membranes  Eyes: Anicteric,cachexia  Lungs:  Clear to auscultation, normal effort  Heart: Regular rate and rhythm  Abdomen:  Soft, nontender  Extremities:  No peripheral edema.  Neurologic: Alert, moving all four extremities  Skin: No lesions  Access: None    Basic Metabolic Panel: Recent Labs  Lab 05/10/23 1117 05/11/23 2114 05/12/23 0422 05/13/23 0312 05/14/23 0943 05/15/23 1404 05/16/23 0454  NA 130* 128* 130* 133* 136 140 146*  K 2.5* 2.5* 4.4 3.6 3.4* 3.3* 3.5  CL 93* 93* 99 101 110 112* 115*  CO2 25 21* 19* 19* 18* 19* 19*  GLUCOSE 97 108* 93 92 89 110* 83  BUN 40* 57* 60* 70* 72* 65* 59*  CREATININE 3.27* 3.80* 3.75* 3.87* 3.41* 2.78* 2.44*  CALCIUM 9.2 8.6* 8.0* 8.0* 7.7* 7.3* 7.0*  MG 1.6* 2.1  --  1.9  --  1.6* 1.9  PHOS  --   --   --  <1.0*  --  2.7  --     Liver Function Tests: Recent Labs  Lab 05/10/23 1117 05/11/23 2114  AST 19 20  ALT 13 14  ALKPHOS 134* 145*  BILITOT 0.8 0.9  PROT 8.9* 8.4*  ALBUMIN <1.5* <1.5*   Recent Labs  Lab 05/11/23 2114  LIPASE 17   Recent Labs  Lab 05/14/23 1914  AMMONIA 24    CBC: Recent Labs  Lab 05/10/23 1117 05/11/23 2043 05/13/23 0312 05/14/23 0943  05/15/23 1404  WBC 6.3 7.3 6.6  6.6 7.1 6.3  NEUTROABS 5.3 5.1 5.8  --   --   HGB 8.7* 10.3* 8.0*  7.8* 7.8* 7.2*  HCT 25.1* 29.3* 22.2*  20.8* 21.2* 20.0*  MCV 98.0 95.1 92.5  89.3 91.8 93.9  PLT 111* 82* 50*  51* 48* 53*    Cardiac Enzymes: No results for input(s): "CKTOTAL", "CKMB", "CKMBINDEX", "TROPONINI" in the last 168 hours.  BNP: Invalid input(s): "POCBNP"  CBG: No results for input(s): "GLUCAP" in the last 168 hours.  Microbiology: Results for orders placed or performed during the hospital encounter of 05/11/23  Urine Culture     Status: Abnormal   Collection Time: 05/11/23  8:12 PM   Specimen: Urine, Random  Result Value Ref Range Status   Specimen Description   Final    URINE, RANDOM Performed at [facility], [address], [place], [place] [address]    Special Requests   Final    NONE Reflexed from [id number] Performed at [facility], [address] [place], [place] [address]    Culture (A)  Final    >=100,000 COLONIES/mL ESCHERICHIA COLI 60,000 COLONIES/mL PSEUDOMONAS AERUGINOSA    Report Status 05/16/2023 FINAL  Final   Organism ID, Bacteria ESCHERICHIA COLI (A)  Final   Organism ID, Bacteria PSEUDOMONAS AERUGINOSA (A)  Final      Susceptibility   Escherichia coli - MIC*    AMPICILLIN 8 SENSITIVE Sensitive     CEFAZOLIN <=4 SENSITIVE Sensitive     CEFEPIME <=0.12 SENSITIVE Sensitive     CEFTRIAXONE <=0.25 SENSITIVE Sensitive     CIPROFLOXACIN <=0.25 SENSITIVE Sensitive     GENTAMICIN <=1 SENSITIVE Sensitive     IMIPENEM <=0.25 SENSITIVE Sensitive     NITROFURANTOIN <=16 SENSITIVE Sensitive     TRIMETH/SULFA <=20 SENSITIVE Sensitive     AMPICILLIN/SULBACTAM <=2 SENSITIVE Sensitive     PIP/TAZO <=4 SENSITIVE Sensitive ug/mL    * >=100,000 COLONIES/mL ESCHERICHIA COLI   Pseudomonas aeruginosa - MIC*    CEFTAZIDIME <=1 SENSITIVE Sensitive     CIPROFLOXACIN <=0.25 SENSITIVE Sensitive     GENTAMICIN <=1 SENSITIVE Sensitive      IMIPENEM 1 SENSITIVE Sensitive     PIP/TAZO <=4 SENSITIVE Sensitive ug/mL    CEFEPIME 0.25 SENSITIVE Sensitive     * 60,000 COLONIES/mL PSEUDOMONAS AERUGINOSA  Culture, blood (Routine X 2) w Reflex to ID Panel     Status: None (Preliminary result)   Collection Time: 05/12/23 10:38 AM   Specimen: BLOOD  Result Value Ref Range Status   Specimen Description BLOOD LEFT AC  Final   Special Requests   Final    BOTTLES DRAWN AEROBIC AND ANAEROBIC Blood Culture adequate volume   Culture   Final    NO GROWTH 4 DAYS Performed at [facility], [address], [place], [place] [address]    Report Status PENDING  Incomplete  Culture, blood (Routine X 2) w Reflex to ID Panel     Status: None (Preliminary result)   Collection Time: 05/12/23 10:38 AM   Specimen: BLOOD  Result Value Ref Range Status   Specimen Description BLOOD RIGHT ARM  Final   Special Requests   Final    BOTTLES DRAWN AEROBIC AND ANAEROBIC Blood Culture results may not be optimal due to an excessive volume of blood received in culture bottles   Culture   Final    NO GROWTH 4 DAYS Performed at [facility], [address] [place], [place] [address]    Report Status PENDING  Incomplete  MRSA Next Gen by PCR, Nasal     Status: None   Collection Time: 05/12/23  4:20 PM   Specimen: Nasal Mucosa; Nasal Swab  Result Value Ref Range Status   MRSA by PCR Next Gen NOT DETECTED NOT DETECTED Final    Comment: (NOTE) The GeneXpert MRSA Assay (FDA approved for NASAL specimens only), is one component of a comprehensive MRSA colonization surveillance program. It is not intended to diagnose MRSA infection nor to guide or monitor treatment for MRSA infections. Test performance is not FDA approved in patients less than 28 years old. Performed at [facility], [address] [place], [place] [address]     Coagulation Studies: No results for input(s): "LABPROT", "INR" in the last 72  hours.   Urinalysis: No results for input(s): "COLORURINE", "LABSPEC", "PHURINE", "GLUCOSEU", "HGBUR", "BILIRUBINUR", "KETONESUR", "PROTEINUR", "UROBILINOGEN", "NITRITE", "LEUKOCYTESUR" in the last 72 hours.  Invalid input(s): "APPERANCEUR"     Imaging: MR BRAIN WO CONTRAST  Result Date: 05/14/2023 CLINICAL DATA:  Encephalopathy.  Multiple myeloma. EXAM: MRI HEAD WITHOUT CONTRAST TECHNIQUE: Multiplanar, multiecho pulse sequences of the brain and surrounding structures were obtained without intravenous contrast. COMPARISON:  Head CT 05/11/2023.  MRI brain 03/11/2023. FINDINGS: Brain:  No acute infarct or hemorrhage. Stable background of mild chronic small-vessel disease. Unchanged temporoparietal predominant atrophy pattern. No acute hydrocephalus or extra-axial collection. No mass or abnormal susceptibility. Vascular: Normal flow voids. Skull and upper cervical spine: Unchanged numerous small T2 hyperintense lesions throughout the skull, compatible with given history of multiple myeloma. Sinuses/Orbits: No acute findings. Other: None. IMPRESSION: 1. No acute intracranial process. 2. Unchanged numerous small T2 hyperintense lesions throughout the skull, compatible with given history of multiple myeloma. Electronically Signed   By: Orvan Falconer M.D.   On: 05/14/2023 19:46     Medications:    dextrose 5 % and 0.45 % NaCl 75 mL/hr at 05/16/23 0900   thiamine (VITAMIN B1) injection 500 mg (05/16/23 0857)    acyclovir  400 mg Oral BID   cephALEXin  250 mg Oral QHS   feeding supplement  237 mL Oral BID BM   heparin  5,000 Units Subcutaneous Q8H   levothyroxine  68.5 mcg Oral Q0600   midodrine  7.5 mg Oral TID WC   mouth rinse  15 mL Mouth Rinse 4 times per day   pantoprazole  40 mg Oral Daily   polyethylene glycol  17 g Oral Daily   sodium bicarbonate  650 mg Oral BID   sodium chloride flush  3 mL Intravenous Q12H   acetaminophen **OR** acetaminophen, mouth rinse  Assessment/ Plan:   Kathy [name] is a 82 y.o.  female  with hypertension, hypothyroidism, peptic ulcer disease, rheumatoid arthritis, history of gastric surgery, and shoulder replacement who was admitted to [facility] on 05/11/2023 for AKI (acute kidney injury) (HCC) [N17.9] Cystitis [N30.90] AMS (altered mental status) [R41.82] 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, psychotic disturbance, mood disturbance, or anxiety, unspecified dementia type (HCC) [F03.C0]   Acute Kidney Injury: baseline creatinine of 0.77 on 05/03/23. Urine consistent with urinary tract infection.  -Creatinine continues to improve -Patient states she is voiding - No acute indication for dialysis.  - holding furosemide.   Lab Results  Component Value Date   CREATININE 2.44 (H) 05/16/2023   CREATININE 2.78 (H) 05/15/2023   CREATININE 3.41 (H) 05/14/2023    Intake/Output Summary (Last 24 hours) at 05/16/2023 1155 Last data filed at 05/16/2023 0557 Gross per 24 hour  Intake 2011.4 ml  Output --  Net 2011.4 ml   Hypernatremia, was hyponatremia on admission. Sodium 146. IVF changed to D5-0.45% NS   Acute metabolic acidosis: S bicarb 19.Remains on oral sodium bicarbonate.    Anemia with acute kidney injury and Thrombocytopenia: Platelets slowly improving, 53. Hgb decreased 7.2   Multiple Myeloma - appreciate hematology input.    6.   Urinary tract infection: E. Coli             -Completed IV ceftriaxone.   -Now receiving 7-day course of Keflex   LOS: 4   10/14/202411:55 AM

## 2023-05-16 NOTE — Progress Notes (Signed)
PHARMACY CONSULT NOTE - ELECTROLYTES  Pharmacy Consult for Electrolyte Monitoring and Replacement   Recent Labs:   Estimated Creatinine Clearance: 15.5 mL/min (A) (by C-G formula based on SCr of 2.44 mg/dL (H)). Potassium (mmol/L)  Date Value  05/16/2023 3.5   Magnesium (mg/dL)  Date Value  16/05/9603 1.9   Calcium (mg/dL)  Date Value  54/04/8118 7.0 (L)   Albumin (g/dL)  Date Value  14/78/2956 <1.5 (L)  03/18/2021 3.2 (L)   Phosphorus (mg/dL)  Date Value  21/30/8657 2.7   Sodium (mmol/L)  Date Value  05/16/2023 146 (H)  02/23/2023 135   Corrected Ca: 8.6 mg/dL  Assessment  Kathy [name] is a 82 y.o. female with a past medical history significant for anemia, thrombocytopenia, currently on chemotherapy withDaratumumab SQ + Lenalidomide + Dexamethasone (DaraRd) q28d for multiple myeloma, peptic ulcer disease, GERD, hypertension, hypothyroidism, osteoporosis, obesity, sleep apnea, history of pancreatitis . Pharmacy has been consulted to monitor and replace electrolytes.  Diet: DYS 3 MIVF: NS @ 100 mL/hr  Goal of Therapy: Electrolytes WNL  Plan:  No electrolyte replacement warranted for today Check BMP, Mg with AM labs  Thank you for allowing pharmacy to be a part of this patient's care.  Rockwell Alexandria, PharmD Clinical Pharmacist 05/16/2023 7:22 AM

## 2023-05-16 NOTE — Progress Notes (Signed)
Physical Therapy Treatment Patient Details Name: Kathy [name] MRN: [id number] DOB: Apr 28, 1941 Today's Date: 05/16/2023   History of Present Illness Pt is a 82 y.o. female with a history of dementia, pancreatitis, Roux-en-Y gastric bypass, hypertension who was sent to the ED on 05/11/23 from her SNF due to increased confusion from her baseline. Unclear when this started but facility staff tonight think it has been going on for several days. Patient reports "I do not feel good" but is not able to elaborate.    PT Comments  Pt was received in bed and agreed to PT session. Pt was found with soiled linen, pt was cleaned and moved to EOB Mod+1. From EOB, pt performed STS with bilateral hand held assist, and bilateral foot blocking. Max+2 was necessary to transfer pt from EOB to [facility] to use bathroom. From [facility] pt performed STS MinA+2 with the use of RW (2wheels) and amb to recliner CGA to finish the session. Bilateral foot blocking important with STS and stand to sit. Pt tolerated session fair today. Pt will continue to benefit from skilled PT sessions in order to increase activity tolerance, enhance gait and standing balance, as well as improve functional mobility.      If plan is discharge home, recommend the following: A lot of help with walking and/or transfers;A lot of help with bathing/dressing/bathroom;Assistance with cooking/housework;Assistance with feeding;Direct supervision/assist for medications management;Direct supervision/assist for financial management;Assist for transportation;Help with stairs or ramp for entrance;Supervision due to cognitive status   Can travel by private vehicle     No  Equipment Recommendations  Rolling walker (2 wheels)    Recommendations for Other Services       Precautions / Restrictions Precautions Precautions: Fall Restrictions Weight Bearing Restrictions: No     Mobility  Bed Mobility Overal bed mobility: Needs Assistance Bed Mobility: Supine to  Sit     Supine to sit: Mod assist     General bed mobility comments: Pt performed bed mobility Mod+1 supine>sit. Leg, trunk and body repositioning with chuck necessary.    Transfers Overall transfer level: Needs assistance Equipment used: Rolling walker (2 wheels), 2 person hand held assist Transfers: Sit to/from Stand, Bed to chair/wheelchair/BSC Sit to Stand: Max assist, +2 safety/equipment     Squat pivot transfers: Max assist, +2 physical assistance     General transfer comment: Pt performed STS from EOB, that turned into a squat pivot transfer to [facility] Max+2. Going from [facility] to recliner, pt performed STS MinA+2.    Ambulation/Gait Ambulation/Gait assistance: Contact guard assist Gait Distance (Feet): 10 Feet Assistive device: Rolling walker (2 wheels) Gait Pattern/deviations: Step-to pattern, Steppage Gait velocity: decreased     General Gait Details: Pt amb from [facility] to recliner with the use of RW (2wheels) CGA.   Stairs             Wheelchair Mobility     Tilt Bed    Modified Rankin (Stroke Patients Only)       Balance Overall balance assessment: Needs assistance Sitting-balance support: Feet supported, Bilateral upper extremity supported Sitting balance-Leahy Scale: Fair Sitting balance - Comments: at first presented with posterior lean that improved with time Postural control: Posterior lean Standing balance support: During functional activity, Bilateral upper extremity supported Standing balance-Leahy Scale: Poor Standing balance comment: Initially pt was unable to perform STS and needed Max+2 to transfer from EOB to [facility]. The 2nd STS performed, pt was able to do MinA+2.  Cognition Arousal: Alert Behavior During Therapy: WFL for tasks assessed/performed Overall Cognitive Status: Within Functional Limits for tasks assessed                                 General Comments: Pt agreeable to  session.        Exercises Other Exercises Other Exercises: During today's session, pt had an incontinent episode and loose BM where hygiene and bed change needed to occur.    General Comments        Pertinent Vitals/Pain Pain Assessment Pain Assessment: No/denies pain Pain Location: LBP Pain Descriptors / Indicators: Aching, Discomfort Pain Intervention(s): Monitored during session    Home Living                          Prior Function            PT Goals (current goals can now be found in the care plan section) Acute Rehab PT Goals Patient Stated Goal: get better and return to [facility]." PT Goal Formulation: With patient Time For Goal Achievement: 05/27/23 Potential to Achieve Goals: Good Progress towards PT goals: Progressing toward goals    Frequency    Min 1X/week      PT Plan      Co-evaluation              AM-PAC PT "6 Clicks" Mobility   Outcome Measure  Help needed turning from your back to your side while in a flat bed without using bedrails?: A Lot Help needed moving from lying on your back to sitting on the side of a flat bed without using bedrails?: A Lot Help needed moving to and from a bed to a chair (including a wheelchair)?: A Lot Help needed standing up from a chair using your arms (e.g., wheelchair or bedside chair)?: A Lot Help needed to walk in hospital room?: A Lot Help needed climbing 3-5 steps with a railing? : Total 6 Click Score: 11    End of Session Equipment Utilized During Treatment: Gait belt Activity Tolerance: Patient tolerated treatment well Patient left: in chair;with call bell/phone within reach;with chair alarm set Nurse Communication: Mobility status PT Visit Diagnosis: Unsteadiness on feet (R26.81);Muscle weakness (generalized) (M62.81);Difficulty in walking, not elsewhere classified (R26.2);Pain Pain - part of body:  (Back)     Time: 5284-1324 PT Time Calculation (min) (ACUTE ONLY): 51  min  Charges:    $Gait Training: 8-22 mins $Therapeutic Activity: 23-37 mins PT General Charges $$ ACUTE PT VISIT: 1 Visit                     Timber Marshman Sauvignon Howard SPT, LAT, ATC    Erico Stan Sauvignon-Howard 05/16/2023, 5:09 PM

## 2023-05-16 NOTE — Progress Notes (Signed)
Patient received cycle #1 Dara-Dex 20 mg-on 9/24; 10 /1; and 10 /8.  GB

## 2023-05-17 ENCOUNTER — Inpatient Hospital Stay: Payer: 59

## 2023-05-17 DIAGNOSIS — E43 Unspecified severe protein-calorie malnutrition: Secondary | ICD-10-CM | POA: Insufficient documentation

## 2023-05-17 DIAGNOSIS — N179 Acute kidney failure, unspecified: Secondary | ICD-10-CM | POA: Diagnosis not present

## 2023-05-17 LAB — KAPPA/LAMBDA LIGHT CHAINS
Kappa free light chain: 385.1 mg/L — ABNORMAL HIGH (ref 3.3–19.4)
Kappa, lambda light chain ratio: 116.7 — ABNORMAL HIGH (ref 0.26–1.65)
Lambda free light chains: 3.3 mg/L — ABNORMAL LOW (ref 5.7–26.3)

## 2023-05-17 LAB — CULTURE, BLOOD (ROUTINE X 2)
Culture: NO GROWTH
Culture: NO GROWTH
Special Requests: ADEQUATE

## 2023-05-17 LAB — CBC
HCT: 19.9 % — ABNORMAL LOW (ref 36.0–46.0)
Hemoglobin: 6.9 g/dL — ABNORMAL LOW (ref 12.0–15.0)
MCH: 33.7 pg (ref 26.0–34.0)
MCHC: 34.7 g/dL (ref 30.0–36.0)
MCV: 97.1 fL (ref 80.0–100.0)
Platelets: 95 10*3/uL — ABNORMAL LOW (ref 150–400)
RBC: 2.05 MIL/uL — ABNORMAL LOW (ref 3.87–5.11)
RDW: 19.8 % — ABNORMAL HIGH (ref 11.5–15.5)
WBC: 4.8 10*3/uL (ref 4.0–10.5)
nRBC: 0.4 % — ABNORMAL HIGH (ref 0.0–0.2)

## 2023-05-17 LAB — HCV AB W REFLEX TO QUANT PCR: HCV Ab: NONREACTIVE

## 2023-05-17 LAB — BASIC METABOLIC PANEL
Anion gap: 10 (ref 5–15)
BUN: 44 mg/dL — ABNORMAL HIGH (ref 8–23)
CO2: 18 mmol/L — ABNORMAL LOW (ref 22–32)
Calcium: 7 mg/dL — ABNORMAL LOW (ref 8.9–10.3)
Chloride: 115 mmol/L — ABNORMAL HIGH (ref 98–111)
Creatinine, Ser: 1.76 mg/dL — ABNORMAL HIGH (ref 0.44–1.00)
GFR, Estimated: 29 mL/min — ABNORMAL LOW (ref >60)
Glucose, Bld: 105 mg/dL — ABNORMAL HIGH (ref 70–99)
Potassium: 2.9 mmol/L — ABNORMAL LOW (ref 3.5–5.1)
Sodium: 143 mmol/L (ref 135–145)

## 2023-05-17 LAB — PRETREATMENT RBC PHENOTYPE

## 2023-05-17 LAB — HAPTOGLOBIN: Haptoglobin: 225 mg/dL (ref 41–333)

## 2023-05-17 LAB — PREPARE RBC (CROSSMATCH)

## 2023-05-17 LAB — MAGNESIUM: Magnesium: 1.6 mg/dL — ABNORMAL LOW (ref 1.7–2.4)

## 2023-05-17 LAB — HCV INTERPRETATION

## 2023-05-17 MED ORDER — POTASSIUM CHLORIDE CRYS ER 20 MEQ PO TBCR
60.0000 meq | EXTENDED_RELEASE_TABLET | Freq: Once | ORAL | Status: AC
  Administered 2023-05-17: 60 meq via ORAL
  Filled 2023-05-17: qty 3

## 2023-05-17 MED ORDER — DEXTROSE-SODIUM CHLORIDE 5-0.45 % IV SOLN: INTRAVENOUS | Status: DC

## 2023-05-17 MED ORDER — SODIUM CHLORIDE 0.9% IV SOLUTION: Freq: Once | INTRAVENOUS | Status: AC

## 2023-05-17 MED ORDER — MAGNESIUM SULFATE 2 GM/50ML IV SOLN
2.0000 g | Freq: Once | INTRAVENOUS | Status: AC
  Administered 2023-05-17: 2 g via INTRAVENOUS
  Filled 2023-05-17: qty 50

## 2023-05-17 MED ORDER — ENSURE ENLIVE PO LIQD
237.0000 mL | Freq: Three times a day (TID) | ORAL | Status: DC
  Administered 2023-05-17 – 2023-05-18 (×3): 237 mL via ORAL

## 2023-05-17 MED ORDER — ADULT MULTIVITAMIN W/MINERALS CH
1.0000 | ORAL_TABLET | Freq: Every day | ORAL | Status: DC
  Administered 2023-05-17 – 2023-05-18 (×2): 1 via ORAL
  Filled 2023-05-17 (×2): qty 1

## 2023-05-17 NOTE — Progress Notes (Signed)
Palliative Care Progress Note   Patient Name: Kathy [name]       Date: 05/17/2023 DOB: February 15, 1941  Age: 82 y.o. MRN#: [id number] Attending Physician: Kathrynn Running, MD Primary Care Physician: [name], MD Admit Date: 05/11/2023  Chart reviewed.  Patient assessed.  She is out of bed to recliner.  She is sleepy, easily awakens to my presence, but quickly falls back to sleep.  She is unable/unwilling to participate in complex medical decision making and goals of care discussion with me today.  PMT has made multiple attempts to speak with family members with no return phone call.  PMT remains awaiting callback from family and available to patient throughout her hospitalization.  PMT will continue to follow peripherally.  Thank you for allowing the Palliative Medicine Team to assist in the care of [facility].  Samara Deist L. Bonita Quin, DNP, FNP-BC Palliative Medicine Team    No charge

## 2023-05-17 NOTE — Consult Note (Signed)
[facility] Inpatient Consult   05/17/2023  Kathy [name] [birth year] [id number]  Primary Care Provider:  Dr. Dondra Prader [facility]  Patient is currently active with Care Management for chronic disease management services.  Patient has been engaged by a Charity fundraiser.  Our community based plan of care has focused on disease management and community resource support.   Patient will receive a post hospital call and will be evaluated for assessments and disease process education.   Plan:Pending SNF level of care when medical stable   Inpatient Transition Of Care [TOC] team member to make aware that Care Management following.  Of note, Care Management services does not replace or interfere with any services that are needed or arranged by inpatient [facility] care management team.   For additional questions or referrals please contact:  Elliot Cousin, RN, [facility] Liaison [place]   Population Health Office Hours MTWF  8:00 am-6:00 pm [phone] mobile [phone] [Office toll free line] Office Hours are M-F 8:30 - 5 pm [email or web address]

## 2023-05-17 NOTE — Progress Notes (Signed)
Dr. Ashok Pall gave order to d/c tele.

## 2023-05-17 NOTE — Plan of Care (Signed)
  Problem: Fluid Volume: Goal: Hemodynamic stability will improve Outcome: Progressing   Problem: Clinical Measurements: Goal: Diagnostic test results will improve Outcome: Progressing Goal: Signs and symptoms of infection will decrease Outcome: Progressing   Problem: Respiratory: Goal: Ability to maintain adequate ventilation will improve Outcome: Progressing   Problem: Education: Goal: Knowledge of General Education information will improve Description: Including pain rating scale, medication(s)/side effects and non-pharmacologic comfort measures Outcome: Progressing   Problem: Urinary Elimination: Goal: Signs and symptoms of infection will decrease Outcome: Progressing   

## 2023-05-17 NOTE — Progress Notes (Signed)
Initial Nutrition Assessment  DOCUMENTATION CODES:   Severe malnutrition in context of chronic illness  INTERVENTION:   -Increase Ensure Enlive po to TID, each supplement provides 350 kcal and 20 grams of protein -MVI with minerals daily -Continue dysphagia 3 diet  NUTRITION DIAGNOSIS:   Severe Malnutrition related to chronic illness (multiple myeloma) as evidenced by mild fat depletion, moderate fat depletion, moderate muscle depletion, severe muscle depletion, percent weight loss.  GOAL:   Patient will meet greater than or equal to 90% of their needs  MONITOR:   PO intake, Supplement acceptance  REASON FOR ASSESSMENT:   Malnutrition Screening Tool    ASSESSMENT:   Pt with medical history significant for history of anemia, thrombocytopenia, currently on chemotherapy withDaratumumab SQ + Lenalidomide + Dexamethasone (DaraRd) q28d for multiple myeloma, peptic ulcer disease, GERD, hypertension, hypothyroidism, osteoporosis, obesity, sleep apnea, history of pancreatitis admitted with encepahlopathy.  Pt admitted with encephalopathy secondary to UTI, AKI, and multiple myeloma.   Reviewed I/O's: +343 ml x 24 hours and +7 L since admission   Pt sitting up in recliner chair, pleasant and in good spirits today. Pt reports she feels better today and that appetite has improved since admission. Pt consumed some [place] sausage and coffee this morning for breakfast. Noted meal completions 15-25%.   Pt does not follow a structured meal plan at home. She shares "I'm always nibbling on something". Favorite foods include yogurt and fritos. Pt is drinking Ensure supplements and is amenable to drink for additional nutrition.   Pt denies any weight loss. She is unsure of her UBW. Reviewed wt hx; pt has experienced a 9.1% wt loss over the past 3 months, which is significant for time frame.   Discussed importance of good meal and supplement intake to promote healing. Reviewed plan for care; pt  amenable to continue.   Palliative care following for goals of care.   Medications reviewed and include miralax, sodium bicarbonate, and thiamine.   Lab Results  Component Value Date   HGBA1C 5.1 05/23/2017   PTA DM medications are none.   Labs reviewed: K: 2.9, CBGS: 115 (inpatient orders for glycemic control are none).    NUTRITION - FOCUSED PHYSICAL EXAM:  Flowsheet Row Most Recent Value  Orbital Region Mild depletion  Upper Arm Region Moderate depletion  Thoracic and Lumbar Region Mild depletion  Buccal Region Mild depletion  Temple Region Moderate depletion  Clavicle Bone Region Severe depletion  Clavicle and Acromion Bone Region Severe depletion  Scapular Bone Region Severe depletion  Dorsal Hand Moderate depletion  Patellar Region Moderate depletion  Anterior Thigh Region Moderate depletion  Posterior Calf Region Moderate depletion  Edema (RD Assessment) Mild  Hair Reviewed  Eyes Reviewed  Mouth Reviewed  Skin Reviewed  Nails Reviewed       Diet Order:   Diet Order             DIET DYS 3 Room service appropriate? Yes; Fluid consistency: Thin  Diet effective now                   EDUCATION NEEDS:   Education needs have been addressed  Skin:  Skin Assessment: Reviewed RN Assessment  Last BM:  05/15/23 (type 6)  Height:   Ht Readings from Last 1 Encounters:  04/06/23 5\' 1"  (1.549 m)    Weight:   Wt Readings from Last 1 Encounters:  05/10/23 64 kg    Ideal Body Weight:  47.7 kg  BMI:  There is  no height or weight on file to calculate BMI.  Estimated Nutritional Needs:   Kcal:  1700-1900  Protein:  85-100 grams  Fluid:  > 1.7 L    Levada Schilling, RD, LDN, CDCES Registered Dietitian III Certified Diabetes Care and Education Specialist Please refer to [facility] for RD and/or RD on-call/weekend/after hours pager

## 2023-05-17 NOTE — Progress Notes (Signed)
Physical Therapy Treatment Patient Details Name: Kathy [name] MRN: [id number] DOB: [birth year] Today's Date: 05/17/2023   History of Present Illness Pt is a 82 y.o. female with a history of dementia, pancreatitis, Roux-en-Y gastric bypass, hypertension who was sent to the ED on 05/11/23 from her SNF due to increased confusion from her baseline. Unclear when this started but facility staff tonight think it has been going on for several days. Patient reports "I do not feel good" but is not able to elaborate.    PT Comments  Pt received in recliner and agreed to PT session. Pt performed STS from recliner with the use of RW (2wheels) MinA and amb to [facility] CGA. After using BSC, pt stood for ~3 min prior to amb for cleansing/hygiene. Verbal cues necessary when pt is seated to perform STS in order to ensure bilateral knees are flexed and bilateral feet are planted on the ground. Pt then amb with RW to hallway and back to recliner to complete the session CGA. Pt tolerated Tx well today and will continue to benefit from skilled PT sessions in order to increase activity tolerance, increase strength as well as improve amb and functional mobility.    If plan is discharge home, recommend the following: A lot of help with walking and/or transfers;A lot of help with bathing/dressing/bathroom;Assistance with cooking/housework;Assistance with feeding;Direct supervision/assist for medications management;Direct supervision/assist for financial management;Assist for transportation;Help with stairs or ramp for entrance;Supervision due to cognitive status   Can travel by private vehicle     No  Equipment Recommendations  Rolling walker (2 wheels)    Recommendations for Other Services       Precautions / Restrictions Precautions Precautions: Fall Restrictions Weight Bearing Restrictions: No     Mobility  Bed Mobility               General bed mobility comments: Pt recieved in recliner. Bed mobility  deferred.    Transfers Overall transfer level: Needs assistance Equipment used: Rolling walker (2 wheels), 2 person hand held assist Transfers: Sit to/from Stand, Bed to chair/wheelchair/BSC Sit to Stand: Min assist           General transfer comment: Pt performed STS MinA from recliner and amb to [facility] CGA. Prior to performing STS, verbal cues necessary to ensure pt flexes their knees and plants their feet on the floor.    Ambulation/Gait Ambulation/Gait assistance: Contact guard assist Gait Distance (Feet): 40 Feet Assistive device: Rolling walker (2 wheels) Gait Pattern/deviations: Step-to pattern, Steppage Gait velocity: decreased     General Gait Details: Pt amb from [facility], to hallway, and back to recliner with the use of RW (2wheels) CGA.   Stairs             Wheelchair Mobility     Tilt Bed    Modified Rankin (Stroke Patients Only)       Balance Overall balance assessment: Needs assistance Sitting-balance support: Feet supported, Bilateral upper extremity supported Sitting balance-Leahy Scale: Fair     Standing balance support: During functional activity, Bilateral upper extremity supported Standing balance-Leahy Scale: Fair                              Cognition Arousal: Alert Behavior During Therapy: WFL for tasks assessed/performed Overall Cognitive Status: Within Functional Limits for tasks assessed  General Comments: Pt agreeable to session.        Exercises Other Exercises Other Exercises: During today's session, pt amb from recliner to [facility] due to BM. Pt sat at [facility] CGA. Following the completion of BM, pt performed STS MinA and stood for ~31min with the use of RW (2wheels) while pt was wiped and brief was donned.    General Comments        Pertinent Vitals/Pain Pain Assessment Pain Assessment: No/denies pain Pain Location: LBP Pain Descriptors / Indicators: Aching,  Discomfort Pain Intervention(s): Monitored during session    Home Living                          Prior Function            PT Goals (current goals can now be found in the care plan section) Acute Rehab PT Goals Patient Stated Goal: get better and return to [facility]." PT Goal Formulation: With patient Time For Goal Achievement: 05/27/23 Potential to Achieve Goals: Good Progress towards PT goals: Progressing toward goals    Frequency    Min 1X/week      PT Plan      Co-evaluation              AM-PAC PT "6 Clicks" Mobility   Outcome Measure  Help needed turning from your back to your side while in a flat bed without using bedrails?: A Lot Help needed moving from lying on your back to sitting on the side of a flat bed without using bedrails?: A Lot Help needed moving to and from a bed to a chair (including a wheelchair)?: A Lot Help needed standing up from a chair using your arms (e.g., wheelchair or bedside chair)?: A Lot Help needed to walk in hospital room?: A Little Help needed climbing 3-5 steps with a railing? : Total 6 Click Score: 12    End of Session Equipment Utilized During Treatment: Gait belt Activity Tolerance: Patient tolerated treatment well Patient left: in chair;with call bell/phone within reach;with chair alarm set Nurse Communication: Mobility status PT Visit Diagnosis: Unsteadiness on feet (R26.81);Muscle weakness (generalized) (M62.81);Difficulty in walking, not elsewhere classified (R26.2);Pain Pain - part of body:  (Back)     Time: 0981-1914 PT Time Calculation (min) (ACUTE ONLY): 26 min  Charges:    $Gait Training: 8-22 mins $Therapeutic Activity: 8-22 mins PT General Charges $$ ACUTE PT VISIT: 1 Visit                     Zuriah Bordas Sauvignon Howard SPT, LAT, ATC    Zaryan Yakubov Sauvignon-Howard 05/17/2023, 1:42 PM

## 2023-05-17 NOTE — Care Management Important Message (Signed)
Important Message  Patient Details  Name: Kathy [name] MRN: [id number] Date of Birth: 1941-04-27   Important Message Given:  Yes - Medicare IM     Bernadette Hoit 05/17/2023, 11:27 AM

## 2023-05-17 NOTE — Progress Notes (Signed)
PROGRESS NOTE    Kathy [name]  OZH:[id number] DOB: 1940-11-15 DOA: 05/11/2023 PCP: [name], MD  Outpatient Specialists: oncology    Brief Narrative:   Kathy [name] is a 82 y.o. female with medical history significant for history of anemia, thrombocytopenia, currently on chemotherapy withDaratumumab SQ + Lenalidomide + Dexamethasone (DaraRd) q28d for multiple myeloma, peptic ulcer disease, GERD, hypertension, hypothyroidism, osteoporosis, obesity, sleep apnea, history of pancreatitis, patient is a limited historian and does not give HPI it is per chart review and per nursing note.  Per report patient states that she does not feel well.    Assessment & Plan:   Principal Problem:   AKI (acute kidney injury) (HCC) Active Problems:   Essential hypertension   Acquired hypothyroidism   GERD (gastroesophageal reflux disease)   S/P shoulder replacement   Rheumatoid arthritis (HCC)   History of Roux-en-Y gastric bypass   Anemia   Multiple myeloma (HCC)   Sepsis secondary to UTI (HCC)   Electrolyte abnormality   Thrombocytopenia (HCC)   AMS (altered mental status)   Protein-calorie malnutrition, severe  # Encephalopathy Likely 2/2 uti and aki and multiple myeloma.  Brother says mental status has been declining for last two months since start of MM treatment.CT and MRI head neg, cxr nothing acute, no intra-abdominal acute pathology on CT of abdomen/pelvis, no signs liver dysfunction. Tsh mild elevation but t4 wnl. Corrected calcium wnl. Abx started prior to blood cultures. No report of seizure-like activity. Ammonia wnl.  Slowly improving.  - treat as below - monitor cultures - continue high-dose thiamine, last day today  # Acute cystitis Very poor historian currently but urinalysis is suggestive of infection. Is immune compromised. Culture growing 10^5 e coli, pan-sensitive - renally-dosed keflex to complete 7-day course  # Thrombocytopenia New, plts have trended  down to 50s, suspect 2/2 recent MM diagnosis, did receive heparin little more than a month ago at that hospitalization. Onc agrees this is MM, ok to resume heparin. Plts improving  # Hypokalemia - replete and f/u mg  # AKI Baseline normal kidney function, here in the 3s. CT stone protocol no signs obstruction. Home lasix may have contributed. Cr now improving, 2.76 today - continue fluids - hold home lasix - nephro following, no additional w/u has been advised - cont sodium bicarb  # Multiple myeloma Recent diagnosis, started on darzalex and dexamethasone - cont acyclovir  # Hx GI bleed # Anemia 2/2 GJ ulcer, in 2023. Hgb here stable from priors though has trended down today to 6.9. no report of melena or other bleeding - monitor closely for bleeding - continue protonix - hold heparin for now - transfuse one unit to maintain hgb above 7  # Hypothyroid Tsh elevated, t4 wnl - cont home synthroid but have increased dose from 50 to 68.5  # RA On weekly adalimumab at home  # Chronic hypotension - cont home midodrine  # End-of-life care - palliative consulted  DVT prophylaxis: heparin Code Status: full Family Communication: brother updated telephonically 10/15  Level of care: Med-Surg Status is: Inpatient Remains inpatient appropriate because: severity of illness, need for IV fluids   Consultants:  Nephrology, oncology  Procedures: none  Antimicrobials:  Ceftriaxone>keflex   Subjective: No complaints, tolerating diet   Objective: Vitals:   05/16/23 1614 05/16/23 1929 05/17/23 0420 05/17/23 0821  BP: 133/76 132/75 124/68 129/65  Pulse: 88 92 76 72  Resp: 20 18 18 16   Temp: 98.3 F (36.8 C) 98.1 F (  36.7 C) 97.8 F (36.6 C) 98 F (36.7 C)  TempSrc:    Oral  SpO2: 98% 100% 99% 100%    Intake/Output Summary (Last 24 hours) at 05/17/2023 1225 Last data filed at 05/16/2023 1535 Gross per 24 hour  Intake 342.94 ml  Output --  Net 342.94 ml   There  were no vitals filed for this visit.  Examination:  General exam: Appears calm and comfortable  Respiratory system: Clear to auscultation. Respiratory effort normal. Cardiovascular system: S1 & S2 heard, RRR. No JVD, murmurs, rubs, gallops or clicks.   Gastrointestinal system: Abdomen is nondistended, soft and nontender.   Central nervous system: Alert and oriented to self, moving all 4 Extremities: Symmetric 5 x 5 power. warm Skin: No rashes, lesions or ulcers Psychiatry: calm and confused    Data Reviewed: I have personally reviewed following labs and imaging studies  CBC: Recent Labs  Lab 05/11/23 2043 05/13/23 0312 05/14/23 0943 05/15/23 1404 05/17/23 1037  WBC 7.3 6.6  6.6 7.1 6.3 4.8  NEUTROABS 5.1 5.8  --   --   --   HGB 10.3* 8.0*  7.8* 7.8* 7.2* 6.9*  HCT 29.3* 22.2*  20.8* 21.2* 20.0* 19.9*  MCV 95.1 92.5  89.3 91.8 93.9 97.1  PLT 82* 50*  51* 48* 53* 95*   Basic Metabolic Panel: Recent Labs  Lab 05/11/23 2114 05/12/23 0422 05/13/23 0312 05/14/23 0943 05/15/23 1404 05/16/23 0454 05/17/23 1037  NA 128*   < > 133* 136 140 146* 143  K 2.5*   < > 3.6 3.4* 3.3* 3.5 2.9*  CL 93*   < > 101 110 112* 115* 115*  CO2 21*   < > 19* 18* 19* 19* 18*  GLUCOSE 108*   < > 92 89 110* 83 105*  BUN 57*   < > 70* 72* 65* 59* 44*  CREATININE 3.80*   < > 3.87* 3.41* 2.78* 2.44* 1.76*  CALCIUM 8.6*   < > 8.0* 7.7* 7.3* 7.0* 7.0*  MG 2.1  --  1.9  --  1.6* 1.9  --   PHOS  --   --  <1.0*  --  2.7  --   --    < > = values in this interval not displayed.   GFR: Estimated Creatinine Clearance: 21.5 mL/min (A) (by C-G formula based on SCr of 1.76 mg/dL (H)). Liver Function Tests: Recent Labs  Lab 05/11/23 2114  AST 20  ALT 14  ALKPHOS 145*  BILITOT 0.9  PROT 8.4*  ALBUMIN <1.5*   Recent Labs  Lab 05/11/23 2114  LIPASE 17   Recent Labs  Lab 05/14/23 1914  AMMONIA 24   Coagulation Profile: Recent Labs  Lab 05/12/23 0527  INR 1.1   Cardiac Enzymes: No  results for input(s): "CKTOTAL", "CKMB", "CKMBINDEX", "TROPONINI" in the last 168 hours. BNP (last 3 results) No results for input(s): "PROBNP" in the last 8760 hours. HbA1C: No results for input(s): "HGBA1C" in the last 72 hours. CBG: No results for input(s): "GLUCAP" in the last 168 hours. Lipid Profile: No results for input(s): "CHOL", "HDL", "LDLCALC", "TRIG", "CHOLHDL", "LDLDIRECT" in the last 72 hours. Thyroid Function Tests: No results for input(s): "TSH", "T4TOTAL", "FREET4", "T3FREE", "THYROIDAB" in the last 72 hours.  Anemia Panel: Recent Labs    05/15/23 0942 05/16/23 0454  FOLATE 10.4  --   FERRITIN 559*  --   TIBC 111*  --   IRON 70  --   RETICCTPCT  --  0.7  Urine analysis:    Component Value Date/Time   COLORURINE AMBER (A) 05/11/2023 2012   APPEARANCEUR CLOUDY (A) 05/11/2023 2012   LABSPEC 1.005 05/11/2023 2012   PHURINE 7.0 05/11/2023 2012   GLUCOSEU NEGATIVE 05/11/2023 2012   HGBUR MODERATE (A) 05/11/2023 2012   BILIRUBINUR NEGATIVE 05/11/2023 2012   KETONESUR NEGATIVE 05/11/2023 2012   PROTEINUR 30 (A) 05/11/2023 2012   UROBILINOGEN 0.2 09/26/2013 1042   NITRITE NEGATIVE 05/11/2023 2012   LEUKOCYTESUR LARGE (A) 05/11/2023 2012   Sepsis Labs: @LABRCNTIP (procalcitonin:4,lacticidven:4)  ) Recent Results (from the past 240 hour(s))  Urine Culture     Status: Abnormal   Collection Time: 05/11/23  8:12 PM   Specimen: Urine, Random  Result Value Ref Range Status   Specimen Description   Final    URINE, RANDOM Performed at [facility], [address], [place], [place] [address]    Special Requests   Final    NONE Reflexed from [id number] Performed at [facility], [address] [place], [place] [address]    Culture (A)  Final    >=100,000 COLONIES/mL ESCHERICHIA COLI 60,000 COLONIES/mL PSEUDOMONAS AERUGINOSA    Report Status 05/16/2023 FINAL  Final   Organism ID, Bacteria ESCHERICHIA COLI (A)  Final   Organism ID, Bacteria  PSEUDOMONAS AERUGINOSA (A)  Final      Susceptibility   Escherichia coli - MIC*    AMPICILLIN 8 SENSITIVE Sensitive     CEFAZOLIN <=4 SENSITIVE Sensitive     CEFEPIME <=0.12 SENSITIVE Sensitive     CEFTRIAXONE <=0.25 SENSITIVE Sensitive     CIPROFLOXACIN <=0.25 SENSITIVE Sensitive     GENTAMICIN <=1 SENSITIVE Sensitive     IMIPENEM <=0.25 SENSITIVE Sensitive     NITROFURANTOIN <=16 SENSITIVE Sensitive     TRIMETH/SULFA <=20 SENSITIVE Sensitive     AMPICILLIN/SULBACTAM <=2 SENSITIVE Sensitive     PIP/TAZO <=4 SENSITIVE Sensitive ug/mL    * >=100,000 COLONIES/mL ESCHERICHIA COLI   Pseudomonas aeruginosa - MIC*    CEFTAZIDIME <=1 SENSITIVE Sensitive     CIPROFLOXACIN <=0.25 SENSITIVE Sensitive     GENTAMICIN <=1 SENSITIVE Sensitive     IMIPENEM 1 SENSITIVE Sensitive     PIP/TAZO <=4 SENSITIVE Sensitive ug/mL    CEFEPIME 0.25 SENSITIVE Sensitive     * 60,000 COLONIES/mL PSEUDOMONAS AERUGINOSA  Culture, blood (Routine X 2) w Reflex to ID Panel     Status: None   Collection Time: 05/12/23 10:38 AM   Specimen: BLOOD  Result Value Ref Range Status   Specimen Description BLOOD LEFT [facility]  Final   Special Requests   Final    BOTTLES DRAWN AEROBIC AND ANAEROBIC Blood Culture adequate volume   Culture   Final    NO GROWTH 5 DAYS Performed at [facility], [address] [place], [place] [address]    Report Status 05/17/2023 FINAL  Final  Culture, blood (Routine X 2) w Reflex to ID Panel     Status: None   Collection Time: 05/12/23 10:38 AM   Specimen: BLOOD  Result Value Ref Range Status   Specimen Description BLOOD RIGHT ARM  Final   Special Requests   Final    BOTTLES DRAWN AEROBIC AND ANAEROBIC Blood Culture results may not be optimal due to an excessive volume of blood received in culture bottles   Culture   Final    NO GROWTH 5 DAYS Performed at [facility], [address], [place], [place] [address]    Report Status 05/17/2023 FINAL  Final  MRSA Next Gen by  PCR, Nasal     Status: None   Collection Time: 05/12/23  4:20 PM   Specimen: Nasal Mucosa; Nasal Swab  Result Value Ref Range Status   MRSA by PCR Next Gen NOT DETECTED NOT DETECTED Final    Comment: (NOTE) The GeneXpert MRSA Assay (FDA approved for NASAL specimens only), is one component of a comprehensive MRSA colonization surveillance program. It is not intended to diagnose MRSA infection nor to guide or monitor treatment for MRSA infections. Test performance is not FDA approved in patients less than 76 years old. Performed at [facility], [address], [place], [place] [address]          Radiology Studies: No results found.      Scheduled Meds:  sodium chloride   Intravenous Once   acyclovir  400 mg Oral BID   cephALEXin  250 mg Oral QHS   feeding supplement  237 mL Oral TID BM   heparin  5,000 Units Subcutaneous Q8H   levothyroxine  68.5 mcg Oral Q0600   midodrine  7.5 mg Oral TID WC   multivitamin with minerals  1 tablet Oral Q lunch   mouth rinse  15 mL Mouth Rinse 4 times per day   pantoprazole  40 mg Oral Daily   polyethylene glycol  17 g Oral Daily   potassium chloride  60 mEq Oral Once   sodium bicarbonate  650 mg Oral BID   sodium chloride flush  3 mL Intravenous Q12H   Continuous Infusions:  thiamine (VITAMIN B1) injection 500 mg (05/17/23 0909)     LOS: 5 days     Silvano Bilis, MD [facility]   If 7PM-7AM, please contact night-coverage [email or web address] Password [facility] 05/17/2023, 12:25 PM

## 2023-05-17 NOTE — Progress Notes (Signed)
[facility]  ROUNDING NOTE   Subjective:   Patient sitting up in bed Alert and oriented Denies pain Tolerating small meals  Creatinine 1.76   Objective:  Vital signs in last 24 hours:  Temp:  [97.8 F (36.6 C)-98.3 F (36.8 C)] 98 F (36.7 C) (10/15 0821) Pulse Rate:  [72-92] 72 (10/15 0821) Resp:  [16-20] 16 (10/15 0821) BP: (124-133)/(65-76) 129/65 (10/15 0821) SpO2:  [98 %-100 %] 100 % (10/15 0821)  Weight change:  There were no vitals filed for this visit.  Intake/Output: I/O last 3 completed shifts: In: 1407 [I.V.:1306.8; IV Piggyback:100.2] Out: -    Intake/Output this shift:  No intake/output data recorded.  Physical Exam: General: NAD  Head: Normocephalic, atraumatic. Moist oral mucosal membranes  Eyes: Anicteric,cachexia  Lungs:  Clear to auscultation, normal effort  Heart: Regular rate and rhythm  Abdomen:  Soft, nontender  Extremities:  No peripheral edema.  Neurologic: Alert, moving all four extremities  Skin: No lesions  Access: None    Basic Metabolic Panel: Recent Labs  Lab 05/11/23 2114 05/12/23 0422 05/13/23 0312 05/14/23 0943 05/15/23 1404 05/16/23 0454 05/17/23 1037  NA 128*   < > 133* 136 140 146* 143  K 2.5*   < > 3.6 3.4* 3.3* 3.5 2.9*  CL 93*   < > 101 110 112* 115* 115*  CO2 21*   < > 19* 18* 19* 19* 18*  GLUCOSE 108*   < > 92 89 110* 83 105*  BUN 57*   < > 70* 72* 65* 59* 44*  CREATININE 3.80*   < > 3.87* 3.41* 2.78* 2.44* 1.76*  CALCIUM 8.6*   < > 8.0* 7.7* 7.3* 7.0* 7.0*  MG 2.1  --  1.9  --  1.6* 1.9  --   PHOS  --   --  <1.0*  --  2.7  --   --    < > = values in this interval not displayed.    Liver Function Tests: Recent Labs  Lab 05/11/23 2114  AST 20  ALT 14  ALKPHOS 145*  BILITOT 0.9  PROT 8.4*  ALBUMIN <1.5*   Recent Labs  Lab 05/11/23 2114  LIPASE 17   Recent Labs  Lab 05/14/23 1914  AMMONIA 24    CBC: Recent Labs  Lab 05/11/23 2043 05/13/23 0312 05/14/23 0943  05/15/23 1404 05/17/23 1037  WBC 7.3 6.6  6.6 7.1 6.3 4.8  NEUTROABS 5.1 5.8  --   --   --   HGB 10.3* 8.0*  7.8* 7.8* 7.2* 6.9*  HCT 29.3* 22.2*  20.8* 21.2* 20.0* 19.9*  MCV 95.1 92.5  89.3 91.8 93.9 97.1  PLT 82* 50*  51* 48* 53* 95*    Cardiac Enzymes: No results for input(s): "CKTOTAL", "CKMB", "CKMBINDEX", "TROPONINI" in the last 168 hours.  BNP: Invalid input(s): "POCBNP"  CBG: No results for input(s): "GLUCAP" in the last 168 hours.  Microbiology: Results for orders placed or performed during the hospital encounter of 05/11/23  Urine Culture     Status: Abnormal   Collection Time: 05/11/23  8:12 PM   Specimen: Urine, Random  Result Value Ref Range Status   Specimen Description   Final    URINE, RANDOM Performed at [facility], [address], [place], [place] [address]    Special Requests   Final    NONE Reflexed from [id number] Performed at [facility], [address] [place], [place] [address]    Culture (A)  Final    >=100,000 COLONIES/mL ESCHERICHIA COLI 60,000 COLONIES/mL PSEUDOMONAS AERUGINOSA    Report Status 05/16/2023 FINAL  Final   Organism ID, Bacteria ESCHERICHIA COLI (A)  Final   Organism ID, Bacteria PSEUDOMONAS AERUGINOSA (A)  Final      Susceptibility   Escherichia coli - MIC*    AMPICILLIN 8 SENSITIVE Sensitive     CEFAZOLIN <=4 SENSITIVE Sensitive     CEFEPIME <=0.12 SENSITIVE Sensitive     CEFTRIAXONE <=0.25 SENSITIVE Sensitive     CIPROFLOXACIN <=0.25 SENSITIVE Sensitive     GENTAMICIN <=1 SENSITIVE Sensitive     IMIPENEM <=0.25 SENSITIVE Sensitive     NITROFURANTOIN <=16 SENSITIVE Sensitive     TRIMETH/SULFA <=20 SENSITIVE Sensitive     AMPICILLIN/SULBACTAM <=2 SENSITIVE Sensitive     PIP/TAZO <=4 SENSITIVE Sensitive ug/mL    * >=100,000 COLONIES/mL ESCHERICHIA COLI   Pseudomonas aeruginosa - MIC*    CEFTAZIDIME <=1 SENSITIVE Sensitive     CIPROFLOXACIN <=0.25 SENSITIVE Sensitive     GENTAMICIN <=1  SENSITIVE Sensitive     IMIPENEM 1 SENSITIVE Sensitive     PIP/TAZO <=4 SENSITIVE Sensitive ug/mL    CEFEPIME 0.25 SENSITIVE Sensitive     * 60,000 COLONIES/mL PSEUDOMONAS AERUGINOSA  Culture, blood (Routine X 2) w Reflex to ID Panel     Status: None   Collection Time: 05/12/23 10:38 AM   Specimen: BLOOD  Result Value Ref Range Status   Specimen Description BLOOD LEFT [facility]  Final   Special Requests   Final    BOTTLES DRAWN AEROBIC AND ANAEROBIC Blood Culture adequate volume   Culture   Final    NO GROWTH 5 DAYS Performed at [facility], [address] [place], [place] [address]    Report Status 05/17/2023 FINAL  Final  Culture, blood (Routine X 2) w Reflex to ID Panel     Status: None   Collection Time: 05/12/23 10:38 AM   Specimen: BLOOD  Result Value Ref Range Status   Specimen Description BLOOD RIGHT ARM  Final   Special Requests   Final    BOTTLES DRAWN AEROBIC AND ANAEROBIC Blood Culture results may not be optimal due to an excessive volume of blood received in culture bottles   Culture   Final    NO GROWTH 5 DAYS Performed at [facility], [address] [place], [place] [address]    Report Status 05/17/2023 FINAL  Final  MRSA Next Gen by PCR, Nasal     Status: None   Collection Time: 05/12/23  4:20 PM   Specimen: Nasal Mucosa; Nasal Swab  Result Value Ref Range Status   MRSA by PCR Next Gen NOT DETECTED NOT DETECTED Final    Comment: (NOTE) The GeneXpert MRSA Assay (FDA approved for NASAL specimens only), is one component of a comprehensive MRSA colonization surveillance program. It is not intended to diagnose MRSA infection nor to guide or monitor treatment for MRSA infections. Test performance is not FDA approved in patients less than 21 years old. Performed at [facility], [address] [place], [place] [address]     Coagulation Studies: No results for input(s): "LABPROT", "INR" in the last 72 hours.   Urinalysis: No  results for input(s): "COLORURINE", "LABSPEC", "PHURINE", "GLUCOSEU", "HGBUR", "BILIRUBINUR", "KETONESUR", "PROTEINUR", "UROBILINOGEN", "NITRITE", "LEUKOCYTESUR" in the last 72 hours.  Invalid input(s): "APPERANCEUR"     Imaging: No results found.   Medications:    thiamine (VITAMIN B1) injection 500 mg (05/17/23 0909)  acyclovir  400 mg Oral BID   cephALEXin  250 mg Oral QHS   feeding supplement  237 mL Oral BID BM   heparin  5,000 Units Subcutaneous Q8H   levothyroxine  68.5 mcg Oral Q0600   midodrine  7.5 mg Oral TID WC   mouth rinse  15 mL Mouth Rinse 4 times per day   pantoprazole  40 mg Oral Daily   polyethylene glycol  17 g Oral Daily   sodium bicarbonate  650 mg Oral BID   sodium chloride flush  3 mL Intravenous Q12H   acetaminophen **OR** acetaminophen, mouth rinse  Assessment/ Plan:  Ms. Kathy [name] is a 82 y.o.  female  with hypertension, hypothyroidism, peptic ulcer disease, rheumatoid arthritis, history of gastric surgery, and shoulder replacement who was admitted to [facility] on 05/11/2023 for AKI (acute kidney injury) (HCC) [N17.9] Cystitis [N30.90] AMS (altered mental status) [R41.82] 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, psychotic disturbance, mood disturbance, or anxiety, unspecified dementia type (HCC) [F03.C0]   Acute Kidney Injury: baseline creatinine of 0.77 on 05/03/23. Urine consistent with urinary tract infection.  -Creatinine imrpoving -No urine output recorded  - No acute indication for dialysis.  - holding furosemide.   Lab Results  Component Value Date   CREATININE 1.76 (H) 05/17/2023   CREATININE 2.44 (H) 05/16/2023   CREATININE 2.78 (H) 05/15/2023    Intake/Output Summary (Last 24 hours) at 05/17/2023 1204 Last data filed at 05/16/2023 1535 Gross per 24 hour  Intake 342.94 ml  Output --  Net 342.94 ml   Hypernatremia, was hyponatremia on admission. Sodium corrected to 143 with D5-0.45% NS   Acute metabolic acidosis: S  bicarb 18.Oral sodium bicarbonate.    Anemia with acute kidney injury and Thrombocytopenia: Platelets continue to improve. Hgb 6.9, will defer need of blood transfusion to primary team.   Multiple Myeloma - appreciate hematology input.  - MM panel pending   6.   Urinary tract infection: E. Coli             -Completed IV ceftriaxone.   -Now receiving 7-day course of Keflex   LOS: 5   10/15/202412:04 PM

## 2023-05-18 DIAGNOSIS — N390 Urinary tract infection, site not specified: Secondary | ICD-10-CM | POA: Diagnosis not present

## 2023-05-18 DIAGNOSIS — E870 Hyperosmolality and hypernatremia: Secondary | ICD-10-CM | POA: Insufficient documentation

## 2023-05-18 DIAGNOSIS — C9000 Multiple myeloma not having achieved remission: Secondary | ICD-10-CM | POA: Diagnosis not present

## 2023-05-18 DIAGNOSIS — A419 Sepsis, unspecified organism: Secondary | ICD-10-CM | POA: Diagnosis not present

## 2023-05-18 DIAGNOSIS — N179 Acute kidney failure, unspecified: Secondary | ICD-10-CM | POA: Diagnosis not present

## 2023-05-18 LAB — BASIC METABOLIC PANEL
Anion gap: 5 (ref 5–15)
BUN: 39 mg/dL — ABNORMAL HIGH (ref 8–23)
CO2: 20 mmol/L — ABNORMAL LOW (ref 22–32)
Calcium: 6.4 mg/dL — CL (ref 8.9–10.3)
Chloride: 115 mmol/L — ABNORMAL HIGH (ref 98–111)
Creatinine, Ser: 1.51 mg/dL — ABNORMAL HIGH (ref 0.44–1.00)
GFR, Estimated: 35 mL/min — ABNORMAL LOW (ref >60)
Glucose, Bld: 93 mg/dL (ref 70–99)
Potassium: 3.3 mmol/L — ABNORMAL LOW (ref 3.5–5.1)
Sodium: 140 mmol/L (ref 135–145)

## 2023-05-18 LAB — MAGNESIUM: Magnesium: 2 mg/dL (ref 1.7–2.4)

## 2023-05-18 LAB — CBC
HCT: 20 % — ABNORMAL LOW (ref 36.0–46.0)
Hemoglobin: 7 g/dL — ABNORMAL LOW (ref 12.0–15.0)
MCH: 32.9 pg (ref 26.0–34.0)
MCHC: 35 g/dL (ref 30.0–36.0)
MCV: 93.9 fL (ref 80.0–100.0)
Platelets: 89 10*3/uL — ABNORMAL LOW (ref 150–400)
RBC: 2.13 MIL/uL — ABNORMAL LOW (ref 3.87–5.11)
RDW: 18.3 % — ABNORMAL HIGH (ref 11.5–15.5)
WBC: 4.7 10*3/uL (ref 4.0–10.5)
nRBC: 0.4 % — ABNORMAL HIGH (ref 0.0–0.2)

## 2023-05-18 LAB — PHOSPHORUS: Phosphorus: 2.9 mg/dL (ref 2.5–4.6)

## 2023-05-18 LAB — PREPARE RBC (CROSSMATCH)

## 2023-05-18 LAB — ALBUMIN: Albumin: <1.5 g/dL — ABNORMAL LOW (ref 3.5–5.0)

## 2023-05-18 MED ORDER — MIDODRINE HCL 2.5 MG PO TABS: 7.5000 mg | ORAL_TABLET | Freq: Three times a day (TID) | ORAL | Status: DC

## 2023-05-18 MED ORDER — TRAMADOL HCL 50 MG PO TABS: 50.0000 mg | ORAL_TABLET | Freq: Three times a day (TID) | ORAL | 0 refills | Status: DC | PRN

## 2023-05-18 MED ORDER — LEVOTHYROXINE SODIUM 137 MCG PO TABS: 68.5000 ug | ORAL_TABLET | Freq: Every day | ORAL | Status: DC

## 2023-05-18 MED ORDER — CEPHALEXIN 250 MG PO CAPS: 250.0000 mg | ORAL_CAPSULE | Freq: Every day | ORAL | Status: AC

## 2023-05-18 MED ORDER — POTASSIUM CHLORIDE CRYS ER 20 MEQ PO TBCR
40.0000 meq | EXTENDED_RELEASE_TABLET | ORAL | Status: AC
  Administered 2023-05-18 (×2): 40 meq via ORAL
  Filled 2023-05-18 (×2): qty 2

## 2023-05-18 MED ORDER — SODIUM CHLORIDE 0.9% IV SOLUTION: Freq: Once | INTRAVENOUS | Status: AC

## 2023-05-18 MED ORDER — SODIUM BICARBONATE 650 MG PO TABS: 650.0000 mg | ORAL_TABLET | Freq: Two times a day (BID) | ORAL | Status: AC

## 2023-05-18 NOTE — Progress Notes (Signed)
Pt is discharged to [facility]. Pts IV' s were removed and family notified. I called to give report 4 times @ [phone] but was unsuccessful at reaching the oncoming nurse. All belongngs were sent with pt.

## 2023-05-18 NOTE — Progress Notes (Signed)
PHARMACY CONSULT NOTE - ELECTROLYTES  Pharmacy Consult for Electrolyte Monitoring and Replacement   Recent Labs:   Estimated Creatinine Clearance: 25 mL/min (A) (by C-G formula based on SCr of 1.51 mg/dL (H)). Potassium (mmol/L)  Date Value  05/18/2023 3.3 (L)   Magnesium (mg/dL)  Date Value  16/05/9603 2.0   Calcium (mg/dL)  Date Value  54/04/8118 6.4 (LL)   Albumin (g/dL)  Date Value  14/78/2956 <1.5 (L)  03/18/2021 3.2 (L)   Phosphorus (mg/dL)  Date Value  21/30/8657 2.9   Sodium (mmol/L)  Date Value  05/18/2023 140  02/23/2023 135   Corrected Ca: 8.6 mg/dL  Assessment  Edlyn Rosenburg Dragone is a 82 y.o. female with a past medical history significant for anemia, thrombocytopenia, currently on chemotherapy withDaratumumab SQ + Lenalidomide + Dexamethasone (DaraRd) q28d for multiple myeloma, peptic ulcer disease, GERD, hypertension, hypothyroidism, osteoporosis, obesity, sleep apnea, history of pancreatitis . Pharmacy has been consulted to monitor and replace electrolytes.  Diet: DYS 3 MIVF: NS @ 100 mL/hr Goal of Therapy: Electrolytes WNL  Plan:  K 3.3: Give KCL 40 mEq PO x2 ordered by provider No additional electrolyte replacement warranted for today Check BMP, Mg with AM labs  Thank you for allowing pharmacy to be a part of this patient's care.  Rockwell Alexandria, PharmD Clinical Pharmacist 05/18/2023 7:41 AM

## 2023-05-18 NOTE — TOC Transition Note (Signed)
Transition of Care [facility]) - CM/SW Discharge Note   Patient Details  Name: Kathy [name] MRN: [id number] Date of Birth: 30-Aug-1940  [facility] of [facility]) CM/SW Contact:  Allena Katz, LCSW Phone Number: 05/18/2023, 10:57 AM   Clinical Narrative:   Pt has orders to discharge back to [facility]. RN given number for report. Medical necessity printed and on the unit. DC summary and fl2 to be sent once in. T[facility]anie at [facility] agrees to take patient back today.     Final next level of care: Skilled Nursing Facility Barriers to Discharge: Barriers Resolved   Patient Goals and CMS Choice      Discharge Placement                  Patient to be transferred to facility by: acems   Patient and family notified of of transfer: 05/18/23  Discharge Plan and Services Additional resources added to the After Visit Summary for       Post Acute Care Choice: Skilled Nursing Facility                               Social Determinants of Health (SDOH) Interventions SDOH Screenings   Food Insecurity: Patient Unable To Answer (05/12/2023)  Housing: High Risk (05/12/2023)  Transportation Needs: Patient Unable To Answer (05/12/2023)  Utilities: Patient Unable To Answer (05/12/2023)  Alcohol Screen: Low Risk  (09/14/2021)  Depression (PHQ2-9): Low Risk  (03/30/2023)  Financial Resource Strain: Low Risk  (09/14/2021)  Physical Activity: Sufficiently Active (09/14/2021)  Social Connections: Moderately Isolated (09/14/2021)  Stress: No Stress Concern Present (09/14/2021)  Tobacco Use: Low Risk  (05/12/2023)     Readmission Risk Interventions    05/12/2023    1:07 PM 03/16/2023   10:32 AM  Readmission Risk Prevention Plan  Transportation Screening Complete Complete  PCP or Specialist Appt within 3-5 Days  Complete  HRI or Home Care Consult  Complete  Social Work Consult for Recovery Care Planning/Counseling  Complete  Palliative Care Screening  Not Applicable  Medication  Review Oceanographer)  Complete  PCP or Specialist appointment within 3-5 days of discharge Complete   HRI or Home Care Consult Complete   SW Recovery Care/Counseling Consult Complete   Palliative Care Screening Not Applicable   Skilled Nursing Facility Complete

## 2023-05-18 NOTE — [place] FL2 (Signed)
[place] MEDICAID FL2 LEVEL OF CARE FORM     IDENTIFICATION  Patient Name: Kathy [name] Birthdate: 07/22/1941 Sex: female Admission Date (Current Location): 05/11/2023  [place] and [place] Number:  Chiropodist and Address:  [facility], [address], [place], [place] [address]      Provider Number: [id number]  Attending Physician Name and Address:  Marrion Coy, MD  Relative Name and Phone Number:  Araminta, Derhammer (Brother)  [phone] (Mobile)    Current Level of Care: Hospital Recommended Level of Care: Skilled Nursing Facility Prior Approval Number:    Date Approved/Denied:   PASRR Number: [id number]  Discharge Plan:      Current Diagnoses: Patient Active Problem List   Diagnosis Date Noted   Hypernatremia 05/18/2023   Protein-calorie malnutrition, severe 05/17/2023   Sepsis secondary to UTI (HCC) 05/12/2023   Electrolyte abnormality 05/12/2023   Thrombocytopenia (HCC) 05/12/2023   AMS (altered mental status) 05/12/2023   Multiple myeloma (HCC) 04/14/2023   Hypercalcemia of malignancy 04/07/2023   General weakness 04/07/2023   Recurrent falls 04/07/2023   Hypotension 04/07/2023   Hypoalbuminemia due to protein-calorie malnutrition (HCC) 04/07/2023   Acute prostatitis 04/07/2023   Chronic kidney disease, stage 3b (HCC) 04/07/2023   Dysphagia 04/07/2023   Acute pancreatitis 04/07/2023   History of thyroid cancer 04/07/2023   Anemia 03/30/2023   Physical deconditioning 03/30/2023   Elevated serum immunoglobulin free light chain level 03/30/2023   Leg swelling 02/21/2023   Hospital discharge follow-up 02/21/2023   Hypomagnesemia 02/21/2023   Urge incontinence of urine 02/21/2023   Hypercalcemia 01/23/2023   AKI (acute kidney injury) (HCC) 01/23/2023   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   Hammer toe of left foot 09/15/2022   Left foot pain 09/15/2022   Iron deficiency anemia due to chronic  blood loss 07/19/2022   Rectal bleeding 01/21/2022   Gastrojejunal ulcer with hemorrhage    Encounter for general adult medical examination with abnormal findings 09/16/2021   Hypokalemia 09/16/2021   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   Bilateral impacted cerumen 11/19/2020   Hearing loss 11/19/2020   Malignant tumor of thyroid gland (HCC) 10/08/2020   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   Polyarthritis    Bariatric surgery status    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/2017   Rheumatoid arthritis (HCC) 11/16/2016   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   Rotator cuff arthropathy, 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   S/P shoulder replacement 05/11/2016   Allergic rhinitis 10/19/2013   GERD (gastroesophageal reflux disease) 10/19/2013   Osteoporosis    Essential hypertension    Acquired hypothyroidism    Hyperlipidemia    Anxiety    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   Unspecified protein-calorie malnutrition (HCC) 10/14/2011   Vitamin deficiency 10/14/2011   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    Orientation RESPIRATION BLADDER Height & Weight     Self  Normal Incontinent Weight:   Height:     BEHAVIORAL SYMPTOMS/MOOD NEUROLOGICAL BOWEL NUTRITION STATUS      Incontinent Diet (dys 3)  AMBULATORY STATUS COMMUNICATION OF NEEDS Skin   Limited Assist Verbally Other (Comment) (redness)                       Personal Care Assistance Level of Assistance  Bathing, Feeding, Dressing Bathing Assistance: Limited assistance Feeding assistance: Limited assistance Dressing Assistance: Limited assistance     Functional Limitations Info  SPECIAL CARE FACTORS FREQUENCY                       Contractures      Additional Factors Info  Code Status, Allergies Code Status Info: full Allergies Info: Macrobid(Nitrofurantoin)           Current Medications (05/18/2023):   This is the current hospital active medication list Current Facility-Administered Medications  Medication Dose Route Frequency Provider Last Rate Last Admin   0.9 %  sodium chloride infusion (Manually program via Guardrails IV Fluids)   Intravenous Once Marrion Coy, MD       acetaminophen (TYLENOL) tablet 650 mg  650 mg Oral Q6H PRN Gertha Calkin, MD   650 mg at 05/14/23 1322   Or   acetaminophen (TYLENOL) suppository 650 mg  650 mg Rectal Q6H PRN Gertha Calkin, MD       acyclovir (ZOVIRAX) 200 MG capsule 400 mg  400 mg Oral BID Kathrynn Running, MD   400 mg at 05/18/23 0916   cephALEXin (KEFLEX) capsule 250 mg  250 mg Oral QHS Kathrynn Running, MD   250 mg at 05/17/23 2101   feeding supplement (ENSURE ENLIVE / ENSURE PLUS) liquid 237 mL  237 mL Oral TID BM Kathrynn Running, MD   237 mL at 05/18/23 0917   levothyroxine (SYNTHROID) tablet 68.5 mcg  68.5 mcg Oral Q0600 Kathrynn Running, MD   68.5 mcg at 05/18/23 0526   midodrine (PROAMATINE) tablet 7.5 mg  7.5 mg Oral TID WC Wouk, Wilfred Curtis, MD   7.5 mg at 05/18/23 0272   multivitamin with minerals tablet 1 tablet  1 tablet Oral Q lunch Kathrynn Running, MD   1 tablet at 05/18/23 5366   Oral care mouth rinse  15 mL Mouth Rinse 4 times per day Kathrynn Running, MD   15 mL at 05/18/23 4403   Oral care mouth rinse  15 mL Mouth Rinse PRN Wouk, Wilfred Curtis, MD       pantoprazole (PROTONIX) EC tablet 40 mg  40 mg Oral Daily Kathrynn Running, MD   40 mg at 05/18/23 0916   polyethylene glycol (MIRALAX / GLYCOLAX) packet 17 g  17 g Oral Daily Kathrynn Running, MD   17 g at 05/18/23 0915   potassium chloride SA (KLOR-CON M) CR tablet 40 mEq  40 mEq Oral Verl Bangs, MD   40 mEq at 05/18/23 4742   sodium bicarbonate tablet 650 mg  650 mg Oral BID Kathrynn Running, MD   650 mg at 05/18/23 0916   sodium chloride flush (NS) 0.9 % injection 3 mL  3 mL Intravenous Q12H Gertha Calkin, MD   3 mL at 05/17/23 2102     Discharge  Medications: Please see discharge summary for a list of discharge medications.   STOP taking these medications     dexamethasone 4 MG tablet Commonly known as: DECADRON    diclofenac Sodium 1 % Gel Commonly known as: VOLTAREN    lenalidomide 10 MG capsule Commonly known as: REVLIMID    prochlorperazine 10 MG tablet Commonly known as: COMPAZINE           TAKE these medications     acetaminophen 500 MG tablet Commonly known as: TYLENOL Take 1 tablet (500 mg total) by mouth every 6 (six) hours as needed for mild pain, headache or fever (or Fever >/= 101).    acyclovir 400 MG  tablet Commonly known as: ZOVIRAX Take 1 tablet (400 mg total) by mouth 2 (two) times daily.    Aspirin 81 MG Caps Take 1 capsule by mouth daily.    cephALEXin 250 MG capsule Commonly known as: KEFLEX Take 1 capsule (250 mg total) by mouth at bedtime for 2 days.    ferrous sulfate 325 (65 FE) MG EC tablet Take 1 tablet (325 mg total) by mouth daily.    Fish Oil 1000 MG Caps Take 1 capsule by mouth daily.    furosemide 20 MG tablet Commonly known as: LASIX Take 1 tablet (20 mg total) by mouth 2 (two) times daily.    Humira (2 Pen) 40 MG/0.4ML pen Generic drug: adalimumab Inject 40 mg into the skin every Saturday.    levothyroxine 50 MCG tablet Commonly known as: Synthroid Take 1 tablet (50 mcg total) by mouth daily.    midodrine 2.5 MG tablet Commonly known as: PROAMATINE Take 3 tablets (7.5 mg total) by mouth 3 (three) times daily with meals.    multivitamin with minerals Tabs tablet Take 1 tablet by mouth every morning.    Narcan 4 MG/0.1ML Liqd nasal spray kit Generic drug: naloxone Place 1 spray into the nose as needed (opioid reversal).    omeprazole 40 MG capsule Commonly known as: PRILOSEC Take 1 capsule (40 mg total) by mouth daily before breakfast. Open capsule and swallow granules with liquid or applesauce    ondansetron 4 MG tablet Commonly known as: ZOFRAN Take 4 mg  by mouth every 6 (six) hours as needed for nausea or vomiting.    oxybutynin 10 MG 24 hr tablet Commonly known as: DITROPAN-XL Take 10 mg by mouth at bedtime.    polyethylene glycol 17 g packet Commonly known as: MIRALAX / GLYCOLAX Take 17 g by mouth daily.    potassium chloride SA 20 MEQ tablet Commonly known as: KLOR-CON M Take 1 tablet (20 mEq total) by mouth daily. TAKE 1 TABLET (20 MEQ) BY MOUTH DAILY PRN with Lasix (EDEMA)    Prolia 60 MG/ML Sosy injection Generic drug: denosumab 60mg  Subcutaneous every 6 months 180 days What changed: Another medication with the same name was removed. Continue taking this medication, and follow the directions you see here.    sodium bicarbonate 650 MG tablet Take 1 tablet (650 mg total) by mouth 2 (two) times daily for 5 days.    traMADol 50 MG tablet Commonly known as: ULTRAM Take 1 tablet (50 mg total) by mouth every 8 (eight) hours as needed.        Relevant Imaging Results:  Relevant Lab Results:   Additional Information SS #: [id number]  Tevan Marian, LCSW

## 2023-05-18 NOTE — Plan of Care (Signed)
Problem: Fluid Volume: Goal: Hemodynamic stability will improve Outcome: Progressing   Problem: Clinical Measurements: Goal: Diagnostic test results will improve Outcome: Progressing Goal: Signs and symptoms of infection will decrease Outcome: Progressing   Problem: Respiratory: Goal: Ability to maintain adequate ventilation will improve Outcome: Progressing   Problem: Education: Goal: Knowledge of General Education information will improve Description: Including pain rating scale, medication(s)/side effects and non-pharmacologic comfort measures Outcome: Progressing   Problem: Urinary Elimination: Goal: Signs and symptoms of infection will decrease Outcome: Progressing

## 2023-05-18 NOTE — Discharge Summary (Signed)
Physician Discharge Summary   Patient: Kathy [name] MRN: [id number] DOB: 1941-07-21  Admit date:     05/11/2023  Discharge date: 05/18/23  Discharge Physician: Marrion Coy   PCP: [name], MD   Recommendations at discharge:   Follow-up with PCP in 1 week. Follow-up with palliative care.  Discharge Diagnoses: Principal Problem:   AKI (acute kidney injury) (HCC) Active Problems:   Essential hypertension   Acquired hypothyroidism   GERD (gastroesophageal reflux disease)   S/P shoulder replacement   Rheumatoid arthritis (HCC)   Hypomagnesemia   History of Roux-en-Y gastric bypass   Anemia   Multiple myeloma (HCC)   Sepsis secondary to UTI (HCC)   Electrolyte abnormality   Thrombocytopenia (HCC)   AMS (altered mental status)   Protein-calorie malnutrition, severe   Hypernatremia  Resolved Problems:   * No resolved hospital problems. [facility] Course: Kathy [name] Pain is a 82 y.o. female with medical history significant for history of anemia, thrombocytopenia, currently on chemotherapy withDaratumumab SQ + Lenalidomide + Dexamethasone (DaraRd) q28d for multiple myeloma, peptic ulcer disease, GERD, hypertension, hypothyroidism, osteoporosis, obesity, sleep apnea, history of pancreatitis, patient is a limited historian and does not give HPI it is per chart review and per nursing note.  Per report patient states that she does not feel well.  Upon arriving the hospital, patient was found to have acute renal failure, urinary tract infection.  Patient received IV fluids and antibiotics.  Renal function has improved significantly.  Patient no longer has any dehydration.  At this point, she is medically stable for discharge.  Assessment and Plan: Acute metabolic encephalopathy. Likely 2/2 uti and aki and multiple myeloma.  Brother says mental status has been declining for last two months since start of MM treatment.CT and MRI head neg, cxr nothing acute, no  intra-abdominal acute pathology on CT of abdomen/pelvis, no signs liver dysfunction. Tsh mild elevation but t4 wnl. Corrected calcium wnl. Abx started prior to blood cultures. No report of seizure-like activity. Ammonia wnl. Condition had improved after treating UTI.    # Acute cystitis secondary to E. coli. Sepsis secondary to UTI. Met sepsis criteria at time of admission with significant tachycardia and tachypnea, Grew E. coli, patient has been treated initially with Rocephin, changed to oral antibiotics.  Will continue to complete 7-day course.  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 Multiple myeloma. Hemoglobin dropped down to 7.0 today, will transfuse 1 unit PRBC.  Consent obtained from brother and patient.   # Hypokalemia Acute kidney injury. Hyponatremia. Metabolic acidosis. Initial hyponatremia, Transient hyponatremia. Hypomagnesemia. Hypophosphatemia. Condition all improved.  Repeated potassium again for potassium 3.3.  Continue oral potassium after discharge. Continue sodium bicarb for additional 5 days for CO2 of 20. Creatinine dropped down to 1.51 from original 3.87   # Hx GI bleed # Anemia 2/2 GJ ulcer, in 2023. Hgb here stable from priors though has trended down today to 6.9. no report of melena or other bleeding Anemia thought to be secondary to IV fluids and multiple myeloma.  No active bleeding.  Transfuse 1 unit PRBC today.   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 Poor prognosis. Encourage p.o. intake, follow-up with palliative care as outpatient.       Consultants: None Procedures performed: None  Disposition:  LTC Diet recommendation:  Discharge Diet Orders (From admission, onward)     Start     Ordered   05/18/23 0000  Diet - low sodium heart  healthy        05/18/23 1045           Cardiac diet DISCHARGE MEDICATION: Allergies as of 05/18/2023       Reactions   Macrobid [nitrofurantoin] Other (See Comments)   Stomach pain        Medication List     STOP taking these medications    dexamethasone 4 MG tablet Commonly known as: DECADRON   diclofenac Sodium 1 % Gel Commonly known as: VOLTAREN   lenalidomide 10 MG capsule Commonly known as: REVLIMID   prochlorperazine 10 MG tablet Commonly known as: COMPAZINE       TAKE these medications    acetaminophen 500 MG tablet Commonly known as: TYLENOL Take 1 tablet (500 mg total) by mouth every 6 (six) hours as needed for mild pain, headache or fever (or Fever >/= 101).   acyclovir 400 MG tablet Commonly known as: ZOVIRAX Take 1 tablet (400 mg total) by mouth 2 (two) times daily.   Aspirin 81 MG Caps Take 1 capsule by mouth daily.   cephALEXin 250 MG capsule Commonly known as: KEFLEX Take 1 capsule (250 mg total) by mouth at bedtime for 2 days.   ferrous sulfate 325 (65 FE) MG EC tablet Take 1 tablet (325 mg total) by mouth daily.   Fish Oil 1000 MG Caps Take 1 capsule by mouth daily.   furosemide 20 MG tablet Commonly known as: LASIX Take 1 tablet (20 mg total) by mouth 2 (two) times daily.   Humira (2 Pen) 40 MG/0.4ML pen Generic drug: adalimumab Inject 40 mg into the skin every Saturday.   levothyroxine 50 MCG tablet Commonly known as: Synthroid Take 1 tablet (50 mcg total) by mouth daily.   midodrine 2.5 MG tablet Commonly known as: PROAMATINE Take 3 tablets (7.5 mg total) by mouth 3 (three) times daily with meals.   multivitamin with minerals Tabs tablet Take 1 tablet by mouth every morning.   Narcan 4 MG/0.1ML Liqd nasal spray kit Generic drug: naloxone Place 1 spray into the nose as needed (opioid reversal).   omeprazole 40 MG capsule Commonly known as: PRILOSEC Take 1 capsule (40 mg total) by mouth daily before breakfast.  Open capsule and swallow granules with liquid or applesauce   ondansetron 4 MG tablet Commonly known as: ZOFRAN Take 4 mg by mouth every 6 (six) hours as needed for nausea or vomiting.   oxybutynin 10 MG 24 hr tablet Commonly known as: DITROPAN-XL Take 10 mg by mouth at bedtime.   polyethylene glycol 17 g packet Commonly known as: MIRALAX / GLYCOLAX Take 17 g by mouth daily.   potassium chloride SA 20 MEQ tablet Commonly known as: KLOR-CON M Take 1 tablet (20 mEq total) by mouth daily. TAKE 1 TABLET (20 MEQ) BY MOUTH DAILY PRN with Lasix (EDEMA)   Prolia 60 MG/ML Sosy injection Generic drug: denosumab 60mg  Subcutaneous every 6 months 180 days What changed: Another medication with the same name was removed. Continue taking this medication, and follow the directions you see here.   sodium bicarbonate 650 MG tablet Take 1 tablet (650 mg total) by mouth 2 (two) times daily for 5 days.   traMADol 50 MG tablet Commonly known as: ULTRAM Take 1 tablet (50 mg total) by mouth every 8 (eight) hours as  needed.        Follow-up Information     [name], MD Follow up in 1 week(s).   Specialty: Internal Medicine Contact information: [address] [place] [address] [phone]                Discharge Exam: There were no vitals filed for this visit. General exam: Appears calm and comfortable, appears severely malnourished. Respiratory system: Clear to auscultation. Respiratory effort normal. Cardiovascular system: S1 & S2 heard, RRR. No JVD, murmurs, rubs, gallops or clicks. No pedal edema. Gastrointestinal system: Abdomen is nondistended, soft and nontender. No organomegaly or masses felt. Normal bowel sounds heard. Central nervous system: Alert and oriented. No focal neurological deficits. Extremities: Symmetric 5 x 5 power. Skin: No rashes, lesions or ulcers Psychiatry: Judgement and insight appear normal. Mood & affect appropriate.    Condition at  discharge: good  The results of significant diagnostics from this hospitalization (including imaging, microbiology, ancillary and laboratory) are listed below for reference.   Imaging Studies: MR BRAIN WO CONTRAST  Result Date: 05/14/2023 CLINICAL DATA:  Encephalopathy.  Multiple myeloma. EXAM: MRI HEAD WITHOUT CONTRAST TECHNIQUE: Multiplanar, multiecho pulse sequences of the brain and surrounding structures were obtained without intravenous contrast. COMPARISON:  Head CT 05/11/2023.  MRI brain 03/11/2023. FINDINGS: Brain: No acute infarct or hemorrhage. Stable background of mild chronic small-vessel disease. Unchanged temporoparietal predominant atrophy pattern. No acute hydrocephalus or extra-axial collection. No mass or abnormal susceptibility. Vascular: Normal flow voids. Skull and upper cervical spine: Unchanged numerous small T2 hyperintense lesions throughout the skull, compatible with given history of multiple myeloma. Sinuses/Orbits: No acute findings. Other: None. IMPRESSION: 1. No acute intracranial process. 2. Unchanged numerous small T2 hyperintense lesions throughout the skull, compatible with given history of multiple myeloma. Electronically Signed   By: Orvan Falconer M.D.   On: 05/14/2023 19:46   CT Renal Stone Study  Result Date: 05/12/2023 CLINICAL DATA:  Abdominal and flank pain EXAM: CT ABDOMEN AND PELVIS WITHOUT CONTRAST TECHNIQUE: Multidetector CT imaging of the abdomen and pelvis was performed following the standard protocol without IV contrast. RADIATION DOSE REDUCTION: This exam was performed according to the departmental dose-optimization program which includes automated exposure control, adjustment of the mA and/or kV according to patient size and/or use of iterative reconstruction technique. COMPARISON:  04/06/2023 FINDINGS: Lower chest: No acute abnormality. Bibasilar atelectasis. Stable small pericardial effusion. Hepatobiliary: No focal liver abnormality is seen. Status  post cholecystectomy. No biliary dilatation. Pancreas: Unremarkable Spleen: Unremarkable Adrenals/Urinary Tract: The adrenal glands are unremarkable. The kidneys are normal in size and position. 7 mm nonobstructing calculus noted within the interpolar region of the left kidney. The kidneys are otherwise unremarkable. The bladder is obscured by streak artifact. Stomach/Bowel: Surgical changes of Roux-en-Y gastric bypass are identified. Moderate descending and sigmoid colonic diverticulosis without superimposed acute inflammatory change. Evaluation of pelvis is slightly limited by streak artifact. The stomach, small bowel, and large bowel are otherwise unremarkable. Appendix normal. No free intraperitoneal gas or fluid. Vascular/Lymphatic: Mild aortoiliac atherosclerotic calcification. No aortic aneurysm. No pathologic adenopathy within the abdomen and pelvis. Reproductive: Obscured by streak artifact Other: No abdominal wall hernia. Moderate diffuse subcutaneous body wall edema. 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 Osseous structures are diffusely osteopenic. 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 IMPRESSION: 1. No acute intra-abdominal pathology identified. No definite radiographic explanation for the patient's reported symptoms. 2. Mild left nonobstructing nephrolithiasis. No urolithiasis.  No hydronephrosis. 3. Moderate distal colonic diverticulosis without superimposed acute inflammatory change. 4. Stable small pericardial effusion. 5.  Aortic Atherosclerosis (ICD10-I70.0). Electronically Signed   By: Helyn Numbers M.D.   On: 05/12/2023 00:24   CT Head Wo Contrast  Result Date: 05/11/2023 CLINICAL DATA:  Altered mental status EXAM: CT HEAD WITHOUT CONTRAST TECHNIQUE: Contiguous axial images were obtained from the base of the skull through the vertex without intravenous contrast. RADIATION DOSE  REDUCTION: This exam was performed according to the departmental dose-optimization program which includes automated exposure control, adjustment of the mA and/or kV according to patient size and/or use of iterative reconstruction technique. COMPARISON:  CT 03/11/2023 01/04/2023, MRI 03/11/2023 FINDINGS: Brain: No acute territorial infarction, hemorrhage or intracranial mass. The ventricles are nonenlarged. Mild chronic small vessel ischemic changes of the white matter Vascular: No hyperdense vessels.  Carotid vascular calcification Skull: No fracture. Heterogeneous lucencies throughout the skull base and calvarium with multiple small lucent lesions, similar compared with recent priors, increased compared to 2018 Sinuses/Orbits: No acute finding. Other: None IMPRESSION: 1. No CT evidence for acute intracranial abnormality. 2. Mild chronic small vessel ischemic changes of the white matter. 3. Heterogeneous lucencies throughout the skull base and calvarium with multiple small lucent lesions, similar compared with recent priors, increased compared to 2018. Correlate for any history myeloma or marrow disease. Electronically Signed   By: Jasmine Pang M.D.   On: 05/11/2023 22:37   DG Chest Portable 1 View  Result Date: 05/11/2023 CLINICAL DATA:  Weakness and confusion.  Altered mental status. EXAM: PORTABLE CHEST 1 VIEW COMPARISON:  Chest radiograph 04/06/2023, CT 03/11/2023 FINDINGS: Mild cardiomegaly. Stable mediastinal contours. Aortic atherosclerosis. Minimal ill-defined opacity in the right infrahilar region, likely tortuous vessels when compared with prior CT. No acute airspace disease, large pleural effusion, or pneumothorax. 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 No change from prior exam. The left shoulder reverse arthroplasty appears intact. IMPRESSION: 1. Mild cardiomegaly. No acute intrathoracic abnormality. 2.  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 Recommend dedicated radiographs of the shoulder. Electronically Signed   By: Narda Rutherford M.D.   On: 05/11/2023 22:03    Microbiology: Results for orders placed or performed during the hospital encounter of 05/11/23  Urine Culture     Status: Abnormal   Collection Time: 05/11/23  8:12 PM   Specimen: Urine, Random  Result Value Ref Range Status   Specimen Description   Final    URINE, RANDOM Performed at [facility], [address] [place], [place] [address]    Special Requests   Final    NONE Reflexed from [id number] Performed at [facility], [address] [place], [place] [address]    Culture (A)  Final    >=100,000 COLONIES/mL ESCHERICHIA COLI 60,000 COLONIES/mL PSEUDOMONAS AERUGINOSA    Report Status 05/16/2023 FINAL  Final   Organism ID, Bacteria ESCHERICHIA COLI (A)  Final   Organism ID, Bacteria PSEUDOMONAS AERUGINOSA (A)  Final      Susceptibility   Escherichia coli - MIC*    AMPICILLIN 8 SENSITIVE Sensitive     CEFAZOLIN <=4 SENSITIVE Sensitive     CEFEPIME <=0.12 SENSITIVE Sensitive     CEFTRIAXONE <=0.25 SENSITIVE Sensitive     CIPROFLOXACIN <=0.25 SENSITIVE Sensitive     GENTAMICIN <=1 SENSITIVE Sensitive     IMIPENEM <=0.25 SENSITIVE Sensitive  NITROFURANTOIN <=16 SENSITIVE Sensitive     TRIMETH/SULFA <=20 SENSITIVE Sensitive     AMPICILLIN/SULBACTAM <=2 SENSITIVE Sensitive     PIP/TAZO <=4 SENSITIVE Sensitive ug/mL    * >=100,000 COLONIES/mL ESCHERICHIA COLI   Pseudomonas aeruginosa - MIC*    CEFTAZIDIME <=1 SENSITIVE Sensitive     CIPROFLOXACIN <=0.25 SENSITIVE Sensitive     GENTAMICIN <=1 SENSITIVE Sensitive     IMIPENEM 1 SENSITIVE Sensitive     PIP/TAZO <=4 SENSITIVE Sensitive ug/mL    CEFEPIME 0.25 SENSITIVE Sensitive     * 60,000 COLONIES/mL PSEUDOMONAS AERUGINOSA  Culture, blood (Routine X 2) w  Reflex to ID Panel     Status: None   Collection Time: 05/12/23 10:38 AM   Specimen: BLOOD  Result Value Ref Range Status   Specimen Description BLOOD LEFT [facility]  Final   Special Requests   Final    BOTTLES DRAWN AEROBIC AND ANAEROBIC Blood Culture adequate volume   Culture   Final    NO GROWTH 5 DAYS Performed at [facility], [address] [place], [place] [address]    Report Status 05/17/2023 FINAL  Final  Culture, blood (Routine X 2) w Reflex to ID Panel     Status: None   Collection Time: 05/12/23 10:38 AM   Specimen: BLOOD  Result Value Ref Range Status   Specimen Description BLOOD RIGHT ARM  Final   Special Requests   Final    BOTTLES DRAWN AEROBIC AND ANAEROBIC Blood Culture results may not be optimal due to an excessive volume of blood received in culture bottles   Culture   Final    NO GROWTH 5 DAYS Performed at [facility], [address], [place], [place] [address]    Report Status 05/17/2023 FINAL  Final  MRSA Next Gen by PCR, Nasal     Status: None   Collection Time: 05/12/23  4:20 PM   Specimen: Nasal Mucosa; Nasal Swab  Result Value Ref Range Status   MRSA by PCR Next Gen NOT DETECTED NOT DETECTED Final    Comment: (NOTE) The GeneXpert MRSA Assay (FDA approved for NASAL specimens only), is one component of a comprehensive MRSA colonization surveillance program. It is not intended to diagnose MRSA infection nor to guide or monitor treatment for MRSA infections. Test performance is not FDA approved in patients less than 6 years old. Performed at [facility], [address] [place], [place] [address]     Labs: CBC: Recent Labs  Lab 05/11/23 2043 05/13/23 0312 05/14/23 0943 05/15/23 1404 05/17/23 1037 05/18/23 0411  WBC 7.3 6.6  6.6 7.1 6.3 4.8 4.7  NEUTROABS 5.1 5.8  --   --   --   --   HGB 10.3* 8.0*  7.8* 7.8* 7.2* 6.9* 7.0*  HCT 29.3* 22.2*  20.8* 21.2* 20.0* 19.9* 20.0*  MCV 95.1 92.5  89.3 91.8 93.9 97.1 93.9   PLT 82* 50*  51* 48* 53* 95* 89*   Basic Metabolic Panel: Recent Labs  Lab 05/13/23 0312 05/14/23 0943 05/15/23 1404 05/16/23 0454 05/17/23 1037 05/17/23 1242 05/18/23 0411  NA 133* 136 140 146* 143  --  140  K 3.6 3.4* 3.3* 3.5 2.9*  --  3.3*  CL 101 110 112* 115* 115*  --  115*  CO2 19* 18* 19* 19* 18*  --  20*  GLUCOSE 92 89 110* 83 105*  --  93  BUN 70* 72* 65* 59* 44*  --  39*  CREATININE 3.87*  3.41* 2.78* 2.44* 1.76*  --  1.51*  CALCIUM 8.0* 7.7* 7.3* 7.0* 7.0*  --  6.4*  MG 1.9  --  1.6* 1.9  --  1.6* 2.0  PHOS <1.0*  --  2.7  --   --   --  2.9   Liver Function Tests: Recent Labs  Lab 05/11/23 2114 05/18/23 0411  AST 20  --   ALT 14  --   ALKPHOS 145*  --   BILITOT 0.9  --   PROT 8.4*  --   ALBUMIN <1.5* <1.5*   CBG: No results for input(s): "GLUCAP" in the last 168 hours.  Discharge time spent: greater than 30 minutes.  Signed: Marrion Coy, MD [facility] 05/18/2023

## 2023-05-18 NOTE — Progress Notes (Signed)
Kathy [name]   DOB:[birth year]   VF#:[id number]    Subjective: Patient resting in the bed.  Denies any pain.  Appears more lucid today.  No nausea no vomiting.  Patient getting blood transfusion.  Objective:  Vitals:   05/18/23 1131 05/18/23 1510  BP: 116/63 139/63  Pulse: 69 65  Resp: 18 16  Temp: 98.3 F (36.8 C) 98.3 F (36.8 C)  SpO2:  99%     Intake/Output Summary (Last 24 hours) at 05/18/2023 1604 Last data filed at 05/18/2023 1510 Gross per 24 hour  Intake 1912.92 ml  Output 1 ml  Net 1911.92 ml    Physical Exam Vitals and nursing note reviewed.  HENT:     Head: Normocephalic and atraumatic.     Mouth/Throat:     Pharynx: Oropharynx is clear.  Eyes:     Extraocular Movements: Extraocular movements intact.     Pupils: Pupils are equal, round, and reactive to light.  Cardiovascular:     Rate and Rhythm: Normal rate and regular rhythm.  Pulmonary:     Comments: Decreased breath sounds bilaterally.  Abdominal:     Palpations: Abdomen is soft.  Musculoskeletal:        General: Normal range of motion.     Cervical back: Normal range of motion.  Skin:    General: Skin is warm.  Neurological:     General: No focal deficit present.     Mental Status: She is alert and oriented to person, place, and time.  Psychiatric:        Behavior: Behavior normal.        Judgment: Judgment normal.      Labs:  Lab Results  Component Value Date   WBC 4.7 05/18/2023   HGB 7.0 (L) 05/18/2023   HCT 20.0 (L) 05/18/2023   MCV 93.9 05/18/2023   PLT 89 (L) 05/18/2023   NEUTROABS 5.8 05/13/2023    Lab Results  Component Value Date   NA 140 05/18/2023   K 3.3 (L) 05/18/2023   CL 115 (H) 05/18/2023   CO2 20 (L) 05/18/2023    Studies:  No results found.  Kathy [name] 82 y.o.  female pleasant patient with a with multiple medical problems including rheumatoid arthritis and recent diagnosis of multiple myeloma currently on treatment is currently admitted to  hospital for generalized weakness mental status changes.  Patient diagnosed with UTI and also acute renal failure.   #  IgG kappa multiple myeloma [Presentation with malignant hypercalcemia since 01/23/2023; Dr.Kattragadda, Pattricia Boss [place]]-patient currently on Darzalex and dexamethasone.  Most recent dose of Darzalex -dex appx 1 week ago.  Kappa lambda light chain ratio abnormal; but improved since baseline.  Recommend continued outpatient treatment with Dr. Ellin Saba at [facility].   # Acute renal failure/severe electrolyte imbalance-multifactorial/UTI-improving.  No obvious progression of myeloma noted.    # Anemia/thrombocytopenia likely secondary to multiple myeloma-acute renal failure/underlying sepsis.  N agree with blood transfusion 1 unit  # UTI-E. coli on Keflex as per primary team.  # Mental status changes delirium-improving/likely at baseline   # Severe malnutrition: Continue nutrition support.   Earna Coder, MD 05/18/2023  4:04 PM

## 2023-05-19 ENCOUNTER — Other Ambulatory Visit: Payer: Self-pay

## 2023-05-19 LAB — TYPE AND SCREEN
ABO/RH(D): AB POS
Antibody Screen: POSITIVE
DAT, IgG: NEGATIVE
DAT, complement: NEGATIVE
Unit division: 0
Unit division: 0
Unit division: 0
Unit division: 0
Unit division: 0
Unit division: 0

## 2023-05-19 LAB — BPAM RBC
Blood Product Expiration Date: 202410292359
Blood Product Expiration Date: 202411122359
Blood Product Expiration Date: 202411122359
Blood Product Unit Number: 202411122359
ISSUE DATE / TIME: 202410151807
ISSUE DATE / TIME: 202410161101
Unit Type and Rh: 600
Unit Type and Rh: 202411122359
Unit Type and Rh: 202411122359
Unit Type and Rh: 6200
Unit Type and Rh: 6200
Unit Type and Rh: 6200

## 2023-05-20 ENCOUNTER — Other Ambulatory Visit (HOSPITAL_COMMUNITY): Payer: Self-pay | Admitting: Radiology

## 2023-05-20 ENCOUNTER — Ambulatory Visit: Payer: Self-pay | Admitting: *Deleted

## 2023-05-20 DIAGNOSIS — C9000 Multiple myeloma not having achieved remission: Secondary | ICD-10-CM | POA: Diagnosis not present

## 2023-05-20 DIAGNOSIS — A419 Sepsis, unspecified organism: Secondary | ICD-10-CM | POA: Diagnosis not present

## 2023-05-20 DIAGNOSIS — M069 Rheumatoid arthritis, unspecified: Secondary | ICD-10-CM | POA: Diagnosis not present

## 2023-05-20 DIAGNOSIS — D630 Anemia in neoplastic disease: Secondary | ICD-10-CM | POA: Diagnosis not present

## 2023-05-20 DIAGNOSIS — I9589 Other hypotension: Secondary | ICD-10-CM | POA: Diagnosis not present

## 2023-05-20 DIAGNOSIS — E039 Hypothyroidism, unspecified: Secondary | ICD-10-CM | POA: Diagnosis not present

## 2023-05-20 DIAGNOSIS — N179 Acute kidney failure, unspecified: Secondary | ICD-10-CM | POA: Diagnosis not present

## 2023-05-20 DIAGNOSIS — G9341 Metabolic encephalopathy: Secondary | ICD-10-CM | POA: Diagnosis not present

## 2023-05-20 DIAGNOSIS — E876 Hypokalemia: Secondary | ICD-10-CM | POA: Diagnosis not present

## 2023-05-20 DIAGNOSIS — E43 Unspecified severe protein-calorie malnutrition: Secondary | ICD-10-CM | POA: Diagnosis not present

## 2023-05-20 DIAGNOSIS — Z515 Encounter for palliative care: Secondary | ICD-10-CM | POA: Diagnosis not present

## 2023-05-20 NOTE — Patient Outreach (Signed)
Care Coordination   05/20/2023 Name: Kathy [name] MRN: [id number] DOB: September 04, 1940   Care Coordination Outreach Attempts:  An unsuccessful telephone outreach was attempted today to offer the patient information about available care coordination services.  Follow Up Plan:  No further outreach attempts will be made at this time. We have been unable to contact the patient to offer or enroll patient in care coordination services  Encounter Outcome:  No Answer   Care Coordination Interventions:  No, not indicated     Kaylea Mounsey L. Noelle Penner, RN, BSN, [facility]  VBCI Care Management Coordinator  [phone]  Fax: [phone]

## 2023-05-20 NOTE — Progress Notes (Signed)
Patient for IR Port Placement on Mon 05/23/2023, I called and spoke with the Marylene Land at [facility] on the phone and gave pre-procedure instructions. Marylene Land, her RN was made aware to have the patient here at 1p, NPO after MN prior to procedure as well as driver post procedure/recovery/discharge. Marylene Land stated understanding.  Called 05/20/2023  Also called Pt's POA, her brother Miley Gygi, who is coming in on Friday 05/20/2023 to sign consents for this procedure.

## 2023-05-21 NOTE — H&P (Signed)
Chief Complaint: Patient was seen in consultation today for port-a-catheter placement.   Referring Physician(s): Katragadda,Sreedhar  Supervising Physician: Malachy Moan  Patient Status: [facility] - Out-pt  History of Present Illness: Kathy [name] is a 82 y.o. female with a medical history significant for anemia, thrombocytopenia, sleep apnea, HTN, obesity and multiple myeloma not having achieved remission. She is familiar to IR from a bone marrow biopsy/aspiration 04/08/23. She is currently on a chemotherapy regimen and her oncology team is having an increasingly difficult time obtaining venous access.    Interventional Radiology has been asked to evaluate this patient for an image-guided port-a-catheter placement for durable venous access.   Past Medical History:  Diagnosis Date   Abnormal findings on diagnostic imaging of heart and coronary circulation    Anemia    FROM BLEEDING ULCER   Anxiety    takes Alprazolam daily as needed   Arthritis    dx with RA 2017   Bariatric surgery status    Cataract    Diverticulosis    Gastro-esophageal reflux disease with esophagitis    Gastrojejunal ulcer with hemorrhage    Headache(784.0)    occasionally   History of blood transfusion    no abnormal reaction noted   History of bronchitis    last time many yrs ago   Hyperlipidemia    PT DENIES THIS DX -  ON NO MEDS AND NO ONE HAS TOLD HER   Hypertension    takes Amlodipine daily   Hypothyroidism    takes Synthroid daily   Insomnia    Joint pain    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   Localized edema    Nocturia    Numbness    occasionally left arm at night   Obesity    Osteoporosis    takes Fosamax weekly   Pain in joint involving pelvic region and thigh    Peripheral edema    takes Lasix daily as needed   PONV (postoperative nausea and vomiting)    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/2017   Rheumatoid arthritis, unspecified (HCC)     Rotator cuff arthropathy, 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   Sleep apnea    Phreesia 08/22/2020   Stomach ulcer    Thyroid disease    Phreesia 08/22/2020   Unspecified injury of muscle(s) and tendon(s) of the rotator cuff of left shoulder, subsequent encounter    Unspecified osteoarthritis, unspecified site    Wears glasses    Wears partial dentures     Past Surgical History:  Procedure Laterality Date   ABDOMINAL HYSTERECTOMY     partial   cataract surgery Bilateral    CHOLECYSTECTOMY     COLONOSCOPY     ESOPHAGOGASTRODUODENOSCOPY (EGD) WITH PROPOFOL N/A 01/11/2022   Procedure: ESOPHAGOGASTRODUODENOSCOPY (EGD) WITH PROPOFOL;  Surgeon: Iva Boop, MD;  Location: [facility] ENDOSCOPY;  Service: Gastroenterology;  Laterality: N/A;   EYE SURGERY     CATARACTS BOTH   gastric bypass surgery     HOT HEMOSTASIS N/A 01/11/2022   Procedure: HOT HEMOSTASIS (ARGON PLASMA COAGULATION/BICAP);  Surgeon: Iva Boop, MD;  Location: [facility] ENDOSCOPY;  Service: Gastroenterology;  Laterality: N/A;   JOINT REPLACEMENT Bilateral    hip   REVERSE SHOULDER ARTHROPLASTY Right 05/11/2016   Procedure: REVERSE SHOULDER ARTHROPLASTY;  Surgeon: Teryl Lucy, MD;  Location: [facility] OR;  Service: Orthopedics;  Laterality: Right;   REVISION TOTAL HIP ARTHROPLASTY Left 10/03/2013   DR Turner Daniels   ROTATOR CUFF REPAIR     SCLEROTHERAPY  01/11/2022   Procedure: Susa Day;  Surgeon: Iva Boop, MD;  Location: [facility] ENDOSCOPY;  Service: Gastroenterology;;   TOTAL HIP REVISION Left 10/03/2013   Procedure: TOTAL HIP REVISION- left;  Surgeon: Nestor Lewandowsky, MD;  Location: [facility] OR;  Service: Orthopedics;  Laterality: Left;   TOTAL KNEE ARTHROPLASTY Left 11/29/2017   Procedure: LEFT TOTAL KNEE ARTHROPLASTY;  Surgeon: Teryl Lucy, MD;  Location: [facility] OR;  Service: Orthopedics;  Laterality: Left;   TOTAL SHOULDER ARTHROPLASTY Left 11/09/2016   Procedure: TOTAL REVERSE SHOULDER ARTHROPLASTY;  Surgeon: Teryl Lucy, MD;  Location: [facility] OR;   Service: Orthopedics;  Laterality: Left;   TOTAL SHOULDER REPLACEMENT Left 10/2016   TOTAL THYROIDECTOMY     UPPER GASTROINTESTINAL ENDOSCOPY      Allergies: Macrobid [nitrofurantoin]  Medications: Prior to Admission medications   Medication Sig Start Date End Date Taking? Authorizing Provider  acetaminophen (TYLENOL) 500 MG tablet Take 1 tablet (500 mg total) by mouth every 6 (six) hours as needed for mild pain, headache or fever (or Fever >/= 101). 03/16/23   Shon Hale, MD  acyclovir (ZOVIRAX) 400 MG tablet Take 1 tablet (400 mg total) by mouth 2 (two) times daily. 04/26/23   Doreatha Massed, MD  Adalimumab (HUMIRA PEN) 40 MG/0.4ML PNKT Inject 40 mg into the skin every Saturday.    [provider]  Aspirin 81 MG CAPS Take 1 capsule by mouth daily.    [provider]  denosumab (PROLIA) 60 MG/ML SOSY injection 60mg  Subcutaneous every 6 months 180 days 04/06/23     ferrous sulfate 325 (65 FE) MG EC tablet Take 1 tablet (325 mg total) by mouth daily. 03/16/23   Shon Hale, MD  furosemide (LASIX) 20 MG tablet Take 1 tablet (20 mg total) by mouth 2 (two) times daily. Patient taking differently: Take 20 mg by mouth 2 (two) times daily. 03/16/22   [name], MD  levothyroxine (SYNTHROID) 137 MCG tablet Take 0.5 tablets (68.5 mcg total) by mouth daily at 6 (six) AM. 05/19/23   Marrion Coy, MD  midodrine (PROAMATINE) 2.5 MG tablet Take 3 tablets (7.5 mg total) by mouth 3 (three) times daily with meals. 05/18/23 06/17/23  Marrion Coy, MD  Multiple Vitamin (MULTIVITAMIN WITH MINERALS) TABS tablet Take 1 tablet by mouth every morning.    [provider]  naloxone [facility]) nasal spray 4 mg/0.1 mL Place 1 spray into the nose as needed (opioid reversal).    [provider]  Omega-3 Fatty Acids (FISH OIL) 1000 MG CAPS Take 1 capsule by mouth daily. 03/17/23   [provider]  omeprazole (PRILOSEC) 40 MG capsule Take 1 capsule (40 mg total) by  mouth daily before breakfast. Open capsule and swallow granules with liquid or applesauce 04/15/22   Iva Boop, MD  ondansetron (ZOFRAN) 4 MG tablet Take 4 mg by mouth every 6 (six) hours as needed for nausea or vomiting.    [provider]  oxybutynin (DITROPAN-XL) 10 MG 24 hr tablet Take 10 mg by mouth at bedtime. 03/16/23   [provider]  polyethylene glycol (MIRALAX / GLYCOLAX) 17 g packet Take 17 g by mouth daily. 04/13/23   Sherryll Burger, Pratik D, DO  potassium chloride SA (KLOR-CON M) 20 MEQ tablet Take 1 tablet (20 mEq total) by mouth daily. TAKE 1 TABLET (20 MEQ) BY MOUTH DAILY PRN with Lasix (EDEMA) 03/16/23   Shon Hale, MD  sodium bicarbonate 650 MG tablet Take 1 tablet (650 mg total) by mouth 2 (two)  times daily for 5 days. 05/18/23 05/23/23  Marrion Coy, MD  traMADol (ULTRAM) 50 MG tablet Take 1 tablet (50 mg total) by mouth every 8 (eight) hours as needed. 05/18/23   Marrion Coy, MD     Family History  Problem Relation Age of Onset   Diabetes Mother    Congestive Heart Failure Mother    Lung cancer Father    Thrombosis Sister    CAD Brother        CABG   Colon cancer Neg Hx    Esophageal cancer Neg Hx    Stomach cancer Neg Hx    Rectal cancer Neg Hx     Social History   Socioeconomic History   Marital status: Divorced    Spouse name: Not on file   Number of children: Not on file   Years of education: Not on file   Highest education level: Not on file  Occupational History   Occupation: disabled  Tobacco Use   Smoking status: Never    Passive exposure: Never   Smokeless tobacco: Never  Vaping Use   Vaping status: Never Used  Substance and Sexual Activity   Alcohol use: No   Drug use: No   Sexual activity: Not Currently    Birth control/protection: Surgical  Other Topics Concern   Not on file  Social History Narrative   Not on file   Social Determinants of Health   Financial Resource Strain: Low Risk  (09/14/2021)   Overall  Financial Resource Strain (CARDIA)    Difficulty of Paying Living Expenses: Not hard at all  Food Insecurity: Patient Unable To Answer (05/12/2023)   Hunger Vital Sign    Worried About Programme researcher, broadcasting/film/video in the Last Year: Patient unable to answer    Ran Out of Food in the Last Year: Patient unable to answer  Transportation Needs: Patient Unable To Answer (05/12/2023)   PRAPARE - Transportation    Lack of Transportation (Medical): Patient unable to answer    Lack of Transportation (Non-Medical): Patient unable to answer  Physical Activity: Sufficiently Active (09/14/2021)   Exercise Vital Sign    Days of Exercise per Week: 6 days    Minutes of Exercise per Session: 40 min  Stress: No Stress Concern Present (09/14/2021)   [employer] of Occupational Health - Occupational Stress Questionnaire    Feeling of Stress : Not at all  Social Connections: Moderately Isolated (09/14/2021)   Social Connection and Isolation Panel [NHANES]    Frequency of Communication with Friends and Family: More than three times a week    Frequency of Social Gatherings with Friends and Family: More than three times a week    Attends Religious Services: More than 4 times per year    Active Member of [employer] or Organizations: No    Attends Banker Meetings: Never    Marital Status: Divorced    Review of Systems: A 12 point ROS discussed and pertinent positives are indicated in the HPI above.  All other systems are negative.  Review of Systems  Constitutional:  Positive for fatigue. Negative for appetite change.  Respiratory:  Negative for cough and shortness of breath.   Cardiovascular:  Positive for leg swelling. Negative for chest pain.  Gastrointestinal:  Negative for abdominal pain, diarrhea, nausea and vomiting.  Musculoskeletal:  Negative for back pain.  Neurological:  Negative for dizziness and headaches.    Vital Signs: BP 131/86   Pulse 68   Resp 19  Ht 5' 1.5" (1.562 m)   Wt 154  lb (69.9 kg)   BMI 28.63 kg/m   Physical Exam Constitutional:      General: She is not in acute distress.    Appearance: She is not ill-appearing.  HENT:     Mouth/Throat:     Mouth: Mucous membranes are moist.     Pharynx: Oropharynx is clear.  Cardiovascular:     Rate and Rhythm: Normal rate and regular rhythm.     Pulses: Normal pulses.     Heart sounds: Normal heart sounds.  Pulmonary:     Effort: Pulmonary effort is normal.     Breath sounds: Normal breath sounds.  Abdominal:     General: Bowel sounds are normal.     Palpations: Abdomen is soft.     Tenderness: There is no abdominal tenderness.  Musculoskeletal:     Right lower leg: Edema present.     Left lower leg: Edema present.  Skin:    General: Skin is warm and dry.  Neurological:     Mental Status: She is alert and oriented to person, place, and time.  Psychiatric:        Mood and Affect: Mood normal.        Behavior: Behavior normal.        Thought Content: Thought content normal.        Judgment: Judgment normal.     Imaging: MR BRAIN WO CONTRAST  Result Date: 05/14/2023 CLINICAL DATA:  Encephalopathy.  Multiple myeloma. EXAM: MRI HEAD WITHOUT CONTRAST TECHNIQUE: Multiplanar, multiecho pulse sequences of the brain and surrounding structures were obtained without intravenous contrast. COMPARISON:  Head CT 05/11/2023.  MRI brain 03/11/2023. FINDINGS: Brain: No acute infarct or hemorrhage. Stable background of mild chronic small-vessel disease. Unchanged temporoparietal predominant atrophy pattern. No acute hydrocephalus or extra-axial collection. No mass or abnormal susceptibility. Vascular: Normal flow voids. Skull and upper cervical spine: Unchanged numerous small T2 hyperintense lesions throughout the skull, compatible with given history of multiple myeloma. Sinuses/Orbits: No acute findings. Other: None. IMPRESSION: 1. No acute intracranial process. 2. Unchanged numerous small T2 hyperintense lesions  throughout the skull, compatible with given history of multiple myeloma. Electronically Signed   By: Orvan Falconer M.D.   On: 05/14/2023 19:46   CT Renal Stone Study  Result Date: 05/12/2023 CLINICAL DATA:  Abdominal and flank pain EXAM: CT ABDOMEN AND PELVIS WITHOUT CONTRAST TECHNIQUE: Multidetector CT imaging of the abdomen and pelvis was performed following the standard protocol without IV contrast. RADIATION DOSE REDUCTION: This exam was performed according to the departmental dose-optimization program which includes automated exposure control, adjustment of the mA and/or kV according to patient size and/or use of iterative reconstruction technique. COMPARISON:  04/06/2023 FINDINGS: Lower chest: No acute abnormality. Bibasilar atelectasis. Stable small pericardial effusion. Hepatobiliary: No focal liver abnormality is seen. Status post cholecystectomy. No biliary dilatation. Pancreas: Unremarkable Spleen: Unremarkable Adrenals/Urinary Tract: The adrenal glands are unremarkable. The kidneys are normal in size and position. 7 mm nonobstructing calculus noted within the interpolar region of the left kidney. The kidneys are otherwise unremarkable. The bladder is obscured by streak artifact. Stomach/Bowel: Surgical changes of Roux-en-Y gastric bypass are identified. Moderate descending and sigmoid colonic diverticulosis without superimposed acute inflammatory change. Evaluation of pelvis is slightly limited by streak artifact. The stomach, small bowel, and large bowel are otherwise unremarkable. Appendix normal. No free intraperitoneal gas or fluid. Vascular/Lymphatic: Mild aortoiliac atherosclerotic calcification. No aortic aneurysm. No pathologic adenopathy within the abdomen and  pelvis. Reproductive: Obscured by streak artifact Other: No abdominal wall hernia. Moderate diffuse subcutaneous body wall edema. 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 Osseous structures are diffusely  osteopenic. 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 IMPRESSION: 1. No acute intra-abdominal pathology identified. No definite radiographic explanation for the patient's reported symptoms. 2. Mild left nonobstructing nephrolithiasis. No urolithiasis. No hydronephrosis. 3. Moderate distal colonic diverticulosis without superimposed acute inflammatory change. 4. Stable small pericardial effusion. 5.  Aortic Atherosclerosis (ICD10-I70.0). Electronically Signed   By: Helyn Numbers M.D.   On: 05/12/2023 00:24   CT Head Wo Contrast  Result Date: 05/11/2023 CLINICAL DATA:  Altered mental status EXAM: CT HEAD WITHOUT CONTRAST TECHNIQUE: Contiguous axial images were obtained from the base of the skull through the vertex without intravenous contrast. RADIATION DOSE REDUCTION: This exam was performed according to the departmental dose-optimization program which includes automated exposure control, adjustment of the mA and/or kV according to patient size and/or use of iterative reconstruction technique. COMPARISON:  CT 03/11/2023 01/04/2023, MRI 03/11/2023 FINDINGS: Brain: No acute territorial infarction, hemorrhage or intracranial mass. The ventricles are nonenlarged. Mild chronic small vessel ischemic changes of the white matter Vascular: No hyperdense vessels.  Carotid vascular calcification Skull: No fracture. Heterogeneous lucencies throughout the skull base and calvarium with multiple small lucent lesions, similar compared with recent priors, increased compared to 2018 Sinuses/Orbits: No acute finding. Other: None IMPRESSION: 1. No CT evidence for acute intracranial abnormality. 2. Mild chronic small vessel ischemic changes of the white matter. 3. Heterogeneous lucencies throughout the skull base and calvarium with multiple small lucent lesions, similar compared with recent priors, increased compared to 2018. Correlate for any history  myeloma or marrow disease. Electronically Signed   By: Jasmine Pang M.D.   On: 05/11/2023 22:37   DG Chest Portable 1 View  Result Date: 05/11/2023 CLINICAL DATA:  Weakness and confusion.  Altered mental status. EXAM: PORTABLE CHEST 1 VIEW COMPARISON:  Chest radiograph 04/06/2023, CT 03/11/2023 FINDINGS: Mild cardiomegaly. Stable mediastinal contours. Aortic atherosclerosis. Minimal ill-defined opacity in the right infrahilar region, likely tortuous vessels when compared with prior CT. No acute airspace disease, large pleural effusion, or pneumothorax. 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 No change from prior exam. The left shoulder reverse arthroplasty appears intact. IMPRESSION: 1. Mild cardiomegaly. No acute intrathoracic abnormality. 2. 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 Recommend dedicated radiographs of the shoulder. Electronically Signed   By: Narda Rutherford M.D.   On: 05/11/2023 22:03    Labs:  CBC: Recent Labs    05/14/23 0943 05/15/23 1404 05/17/23 1037 05/18/23 0411  WBC 7.1 6.3 4.8 4.7  HGB 7.8* 7.2* 6.9* 7.0*  HCT 21.2* 20.0* 19.9* 20.0*  PLT 48* 53* 95* 89*    COAGS: Recent Labs    01/23/23 1400 04/06/23 2041 05/12/23 0527  INR 1.1 1.6* 1.1  APTT <20*  --   --     BMP: Recent Labs    05/15/23 1404 05/16/23 0454 05/17/23 1037 05/18/23 0411  NA 140 146* 143 140  K 3.3* 3.5 2.9* 3.3*  CL 112* 115* 115* 115*  CO2 19* 19* 18* 20*  GLUCOSE 110* 83 105* 93  BUN 65* 59* 44* 39*  CALCIUM 7.3* 7.0* 7.0* 6.4*  CREATININE 2.78* 2.44* 1.76* 1.51*  GFRNONAA 17* 19* 29* 35*  LIVER FUNCTION TESTS: Recent Labs    04/26/23 0813 05/03/23 0831 05/10/23 1117 05/11/23 2114 05/18/23 0411  BILITOT 0.4 0.4 0.8 0.9  --   AST 31 21 19 20   --   ALT 17 13 13 14   --   ALKPHOS 97 94 134*  145*  --   PROT 9.6* 8.6* 8.9* 8.4*  --   ALBUMIN <1.5* <1.5* <1.5* <1.5* <1.5*    TUMOR MARKERS: No results for input(s): "AFPTM", "CEA", "CA199", "CHROMGRNA" in the last 8760 hours.  Assessment and Plan:  Multiple myeloma not having achieved remission and receiving chemotherapy; poor venous access: Kathy [name], 82 year old female, presents today to the [facility] Interventional Radiology department for an image-guided port-a-catheter placement.   Risks and benefits of image-guided port-a-catheter placement were discussed with the patient including, but not limited to bleeding, infection, pneumothorax, or fibrin sheath development and need for additional procedures.  All of the patient's questions were answered, patient is agreeable to proceed. She has been NPO. She is a full code.   Consent signed and in chart.  Thank you for this interesting consult.  I greatly enjoyed meeting Kathy [name] and look forward to participating in their care.  A copy of this report was sent to the requesting provider on this date.  Electronically Signed: Alwyn Ren, AGACNP-BC [phone] 05/23/2023, 2:17 PM   I spent a total of  30 Minutes   in face to face in clinical consultation, greater than 50% of which was counseling/coordinating care for port-a-catheter placement.

## 2023-05-23 ENCOUNTER — Encounter: Payer: Self-pay | Admitting: Radiology

## 2023-05-23 ENCOUNTER — Ambulatory Visit
Admission: RE | Admit: 2023-05-23 | Discharge: 2023-05-23 | Disposition: A | Discharge status: Home / Self Care | Payer: 59 | Source: Ambulatory Visit | Attending: Hematology | Admitting: Hematology

## 2023-05-23 DIAGNOSIS — Z9221 Personal history of antineoplastic chemotherapy: Secondary | ICD-10-CM | POA: Insufficient documentation

## 2023-05-23 DIAGNOSIS — C9000 Multiple myeloma not having achieved remission: Secondary | ICD-10-CM | POA: Diagnosis not present

## 2023-05-23 DIAGNOSIS — G4730 Sleep apnea, unspecified: Secondary | ICD-10-CM | POA: Diagnosis not present

## 2023-05-23 DIAGNOSIS — E669 Obesity, unspecified: Secondary | ICD-10-CM | POA: Diagnosis not present

## 2023-05-23 DIAGNOSIS — I10 Essential (primary) hypertension: Secondary | ICD-10-CM | POA: Insufficient documentation

## 2023-05-23 DIAGNOSIS — Z6828 Body mass index (BMI) 28.0-28.9, adult: Secondary | ICD-10-CM | POA: Insufficient documentation

## 2023-05-23 HISTORY — PX: IR IMAGING GUIDED PORT INSERTION: IMG5740

## 2023-05-23 LAB — MULTIPLE MYELOMA PANEL, SERUM
Albumin SerPl Elph-Mcnc: 1.6 g/dL — ABNORMAL LOW (ref 2.9–4.4)
Albumin/Glob SerPl: 0.4 — ABNORMAL LOW (ref 0.7–1.7)
Alpha 1: 0.5 g/dL — ABNORMAL HIGH (ref 0.0–0.4)
Alpha2 Glob SerPl Elph-Mcnc: 0.9 g/dL (ref 0.4–1.0)
B-Globulin SerPl Elph-Mcnc: 3.5 g/dL — ABNORMAL HIGH (ref 0.7–1.3)
Gamma Glob SerPl Elph-Mcnc: 0.2 g/dL — ABNORMAL LOW (ref 0.4–1.8)
Globulin, Total: 5 g/dL — ABNORMAL HIGH (ref 2.2–3.9)
IgA: 9 mg/dL — ABNORMAL LOW (ref 64–422)
IgG (Immunoglobin G), Serum: 3260 mg/dL — ABNORMAL HIGH (ref 586–1602)
IgM (Immunoglobulin M), Srm: <5 mg/dL — ABNORMAL LOW (ref 26–217)
M Protein SerPl Elph-Mcnc: 3.3 g/dL — ABNORMAL HIGH
Total Protein ELP: 6.6 g/dL (ref 6.0–8.5)

## 2023-05-23 MED ORDER — MIDAZOLAM HCL 2 MG/2ML IJ SOLN
INTRAMUSCULAR | Status: AC
  Filled 2023-05-23: qty 2

## 2023-05-23 MED ORDER — FENTANYL CITRATE (PF) 100 MCG/2ML IJ SOLN
INTRAMUSCULAR | Status: AC | PRN
Start: 2023-05-23 — End: 2023-05-23
  Administered 2023-05-23 (×2): 50 ug via INTRAVENOUS

## 2023-05-23 MED ORDER — HEPARIN SOD (PORK) LOCK FLUSH 100 UNIT/ML IV SOLN
INTRAVENOUS | Status: AC
  Filled 2023-05-23: qty 5

## 2023-05-23 MED ORDER — LIDOCAINE-EPINEPHRINE 1 %-1:100000 IJ SOLN
INTRAMUSCULAR | Status: AC
  Filled 2023-05-23: qty 1

## 2023-05-23 MED ORDER — HEPARIN SOD (PORK) LOCK FLUSH 100 UNIT/ML IV SOLN
500.0000 [IU] | Freq: Once | INTRAVENOUS | Status: AC
  Administered 2023-05-23: 500 [IU] via INTRAVENOUS

## 2023-05-23 MED ORDER — FENTANYL CITRATE (PF) 100 MCG/2ML IJ SOLN
INTRAMUSCULAR | Status: AC
  Filled 2023-05-23: qty 2

## 2023-05-23 MED ORDER — MIDAZOLAM HCL 5 MG/5ML IJ SOLN
INTRAMUSCULAR | Status: AC | PRN
Start: 2023-05-23 — End: 2023-05-23
  Administered 2023-05-23: 1 mg via INTRAVENOUS

## 2023-05-23 MED ORDER — SODIUM CHLORIDE 0.9 % IV SOLN: INTRAVENOUS | Status: DC

## 2023-05-23 MED ORDER — LIDOCAINE-EPINEPHRINE 1 %-1:100000 IJ SOLN
20.0000 mL | Freq: Once | INTRAMUSCULAR | Status: AC
  Administered 2023-05-23: 17 mL via INTRADERMAL

## 2023-05-23 NOTE — Progress Notes (Signed)
Called [employer] [facility] again.Await arrival of transport.

## 2023-05-23 NOTE — Progress Notes (Signed)
Biochemist, clinical for transport for pt. : states "we'll be there at 16:30."

## 2023-05-23 NOTE — Progress Notes (Signed)
Patient clinically stable post IR Port placement per Dr Archer Asa, tolerated well. Vitals stable pre and post procedure. Received Versed 1 mg along with Fentanyl 100 mcg IV for procedure. Report given to Weldon Picking RN post procedure/specials/16.

## 2023-05-23 NOTE — Procedures (Signed)
Interventional Radiology Procedure Note  Procedure: Placement of a right IJ approach single lumen PowerPort.  Tip is positioned in the upper RA and catheter is ready for immediate use.  Complications: No immediate Recommendations:  - Ok to shower tomorrow - Do not submerge for 7 days - Routine line care   Signed,  Sterling Big, MD

## 2023-05-23 NOTE — Progress Notes (Signed)
Called [employer] [facility]: states "It will be at least 20 more min.- we are by [facility]."

## 2023-05-24 ENCOUNTER — Inpatient Hospital Stay: Payer: 59

## 2023-05-24 ENCOUNTER — Other Ambulatory Visit: Payer: Self-pay

## 2023-05-24 DIAGNOSIS — C9000 Multiple myeloma not having achieved remission: Secondary | ICD-10-CM

## 2023-05-24 DIAGNOSIS — E039 Hypothyroidism, unspecified: Secondary | ICD-10-CM | POA: Diagnosis not present

## 2023-05-24 DIAGNOSIS — E876 Hypokalemia: Secondary | ICD-10-CM | POA: Diagnosis not present

## 2023-05-24 DIAGNOSIS — G9341 Metabolic encephalopathy: Secondary | ICD-10-CM | POA: Diagnosis not present

## 2023-05-24 DIAGNOSIS — E43 Unspecified severe protein-calorie malnutrition: Secondary | ICD-10-CM | POA: Diagnosis not present

## 2023-05-24 DIAGNOSIS — I9589 Other hypotension: Secondary | ICD-10-CM | POA: Diagnosis not present

## 2023-05-24 DIAGNOSIS — N179 Acute kidney failure, unspecified: Secondary | ICD-10-CM | POA: Diagnosis not present

## 2023-05-24 DIAGNOSIS — A419 Sepsis, unspecified organism: Secondary | ICD-10-CM | POA: Diagnosis not present

## 2023-05-24 DIAGNOSIS — Z515 Encounter for palliative care: Secondary | ICD-10-CM | POA: Diagnosis not present

## 2023-05-24 DIAGNOSIS — D630 Anemia in neoplastic disease: Secondary | ICD-10-CM | POA: Diagnosis not present

## 2023-05-24 DIAGNOSIS — M069 Rheumatoid arthritis, unspecified: Secondary | ICD-10-CM | POA: Diagnosis not present

## 2023-05-28 DIAGNOSIS — E559 Vitamin D deficiency, unspecified: Secondary | ICD-10-CM | POA: Diagnosis not present

## 2023-05-28 DIAGNOSIS — I509 Heart failure, unspecified: Secondary | ICD-10-CM | POA: Diagnosis not present

## 2023-05-31 ENCOUNTER — Ambulatory Visit: Payer: 59 | Admitting: Internal Medicine

## 2023-06-01 ENCOUNTER — Inpatient Hospital Stay: Payer: 59

## 2023-06-01 ENCOUNTER — Inpatient Hospital Stay: Payer: 59 | Admitting: Hematology

## 2023-06-01 VITALS — BP 118/74 | HR 85 | Temp 98.8°F | Resp 16

## 2023-06-01 DIAGNOSIS — C9000 Multiple myeloma not having achieved remission: Secondary | ICD-10-CM

## 2023-06-01 DIAGNOSIS — D649 Anemia, unspecified: Secondary | ICD-10-CM | POA: Diagnosis not present

## 2023-06-01 DIAGNOSIS — N179 Acute kidney failure, unspecified: Secondary | ICD-10-CM | POA: Diagnosis not present

## 2023-06-01 DIAGNOSIS — Z79899 Other long term (current) drug therapy: Secondary | ICD-10-CM | POA: Diagnosis not present

## 2023-06-01 DIAGNOSIS — Z5112 Encounter for antineoplastic immunotherapy: Secondary | ICD-10-CM | POA: Diagnosis not present

## 2023-06-01 LAB — COMPREHENSIVE METABOLIC PANEL
ALT: 12 U/L (ref 0–44)
AST: 18 U/L (ref 15–41)
Albumin: 1.6 g/dL — ABNORMAL LOW (ref 3.5–5.0)
Alkaline Phosphatase: 178 U/L — ABNORMAL HIGH (ref 38–126)
Anion gap: 11 (ref 5–15)
BUN: 8 mg/dL (ref 8–23)
CO2: 23 mmol/L (ref 22–32)
Calcium: 7.4 mg/dL — ABNORMAL LOW (ref 8.9–10.3)
Chloride: 104 mmol/L (ref 98–111)
Creatinine, Ser: 0.77 mg/dL (ref 0.44–1.00)
GFR, Estimated: >60 mL/min (ref >60)
Glucose, Bld: 95 mg/dL (ref 70–99)
Potassium: 3.1 mmol/L — ABNORMAL LOW (ref 3.5–5.1)
Sodium: 138 mmol/L (ref 135–145)
Total Bilirubin: 0.4 mg/dL (ref 0.3–1.2)
Total Protein: 8.3 g/dL — ABNORMAL HIGH (ref 6.5–8.1)

## 2023-06-01 LAB — CBC WITH DIFFERENTIAL/PLATELET
Abs Immature Granulocytes: 0.03 10*3/uL (ref 0.00–0.07)
Basophils Absolute: 0 10*3/uL (ref 0.0–0.1)
Basophils Relative: 0 %
Eosinophils Absolute: 0 10*3/uL (ref 0.0–0.5)
Eosinophils Relative: 0 %
HCT: 26.4 % — ABNORMAL LOW (ref 36.0–46.0)
Hemoglobin: 8.6 g/dL — ABNORMAL LOW (ref 12.0–15.0)
Immature Granulocytes: 1 %
Lymphocytes Relative: 38 %
Lymphs Abs: 1.8 10*3/uL (ref 0.7–4.0)
MCH: 32.8 pg (ref 26.0–34.0)
MCHC: 32.6 g/dL (ref 30.0–36.0)
MCV: 100.8 fL — ABNORMAL HIGH (ref 80.0–100.0)
Monocytes Absolute: 0.7 10*3/uL (ref 0.1–1.0)
Monocytes Relative: 15 %
Neutro Abs: 2.1 10*3/uL (ref 1.7–7.7)
Neutrophils Relative %: 46 %
Platelets: 195 10*3/uL (ref 150–400)
RBC: 2.62 MIL/uL — ABNORMAL LOW (ref 3.87–5.11)
RDW: 17.8 % — ABNORMAL HIGH (ref 11.5–15.5)
WBC: 4.6 10*3/uL (ref 4.0–10.5)
nRBC: 0.4 % — ABNORMAL HIGH (ref 0.0–0.2)

## 2023-06-01 LAB — MAGNESIUM: Magnesium: 1.4 mg/dL — ABNORMAL LOW (ref 1.7–2.4)

## 2023-06-01 MED ORDER — MAGNESIUM SULFATE 2 GM/50ML IV SOLN: 2.0000 g | Freq: Once | INTRAVENOUS | Status: DC

## 2023-06-01 MED ORDER — SODIUM CHLORIDE 0.9 % IV SOLN: INTRAVENOUS | Status: DC

## 2023-06-01 MED ORDER — SODIUM CHLORIDE 0.9% FLUSH
10.0000 mL | Freq: Once | INTRAVENOUS | Status: AC
  Administered 2023-06-01: 10 mL via INTRAVENOUS

## 2023-06-01 MED ORDER — SODIUM CHLORIDE 0.9% FLUSH
10.0000 mL | INTRAVENOUS | Status: DC | PRN
  Administered 2023-06-01: 10 mL via INTRAVENOUS

## 2023-06-01 MED ORDER — ACETAMINOPHEN 325 MG PO TABS
650.0000 mg | ORAL_TABLET | Freq: Once | ORAL | Status: AC
  Administered 2023-06-01: 650 mg via ORAL
  Filled 2023-06-01: qty 2

## 2023-06-01 MED ORDER — HEPARIN SOD (PORK) LOCK FLUSH 100 UNIT/ML IV SOLN
500.0000 [IU] | Freq: Once | INTRAVENOUS | Status: AC
  Administered 2023-06-01: 500 [IU] via INTRAVENOUS

## 2023-06-01 MED ORDER — DARATUMUMAB-HYALURONIDASE-FIHJ 1800-30000 MG-UT/15ML ~~LOC~~ SOLN
1800.0000 mg | Freq: Once | SUBCUTANEOUS | Status: AC
  Administered 2023-06-01: 1800 mg via SUBCUTANEOUS
  Filled 2023-06-01: qty 15

## 2023-06-01 MED ORDER — CETIRIZINE HCL 10 MG PO TABS
10.0000 mg | ORAL_TABLET | Freq: Once | ORAL | Status: AC
Start: 2023-06-01 — End: 2023-06-01
  Administered 2023-06-01: 10 mg via ORAL
  Filled 2023-06-01: qty 1

## 2023-06-01 MED ORDER — MAGNESIUM SULFATE 4 GM/100ML IV SOLN
4.0000 g | Freq: Once | INTRAVENOUS | Status: AC
  Administered 2023-06-01: 4 g via INTRAVENOUS
  Filled 2023-06-01: qty 100

## 2023-06-01 MED ORDER — DEXAMETHASONE 4 MG PO TABS
20.0000 mg | ORAL_TABLET | Freq: Once | ORAL | Status: AC
Start: 2023-06-01 — End: 2023-06-01
  Administered 2023-06-01: 20 mg via ORAL
  Filled 2023-06-01: qty 5

## 2023-06-01 MED ORDER — POTASSIUM CHLORIDE CRYS ER 20 MEQ PO TBCR
40.0000 meq | EXTENDED_RELEASE_TABLET | Freq: Once | ORAL | Status: AC
  Administered 2023-06-01: 40 meq via ORAL
  Filled 2023-06-01: qty 2

## 2023-06-01 NOTE — Patient Instructions (Addendum)
[facility]  Discharge Instructions  You were seen and examined today by Dr. Ellin Saba.  Dr. Ellin Saba discussed your most recent lab work which revealed that Potassium and magnesium are low.  You will receive your treatment today. Continue to have treatments as scheduled.   Follow-up as scheduled.    Thank you for choosing  [facility] to provide your oncology and hematology care.   To afford each patient quality time with our provider, please arrive at least 15 minutes before your scheduled appointment time. You may need to reschedule your appointment if you arrive late (10 or more minutes). Arriving late affects you and other patients whose appointments are after yours.  Also, if you miss three or more appointments without notifying the office, you may be dismissed from the clinic at the provider's discretion.    Again, thank you for choosing [facility].  Our hope is that these requests will decrease the amount of time that you wait before being seen by our physicians.   If you have a lab appointment with the Cancer Center - please note that after April 8th, all labs will be drawn in the cancer center.  You do not have to check in or register with the main entrance as you have in the past but will complete your check-in at the cancer center.            _____________________________________________________________  Should you have questions after your visit to [facility], please contact our office at [phone] and follow the prompts.  Our office hours are 8:00 a.m. to 4:30 p.m. Monday - Thursday and 8:00 a.m. to 2:30 p.m. Friday.  Please note that voicemails left after 4:00 p.m. may not be returned until the following business day.  We are closed weekends and all major holidays.  You do have access to a nurse 24-7, just call the main number to the clinic [phone] and do not press any options, hold on  the line and a nurse will answer the phone.    For prescription refill requests, have your pharmacy contact our office and allow 72 hours.    Masks are no longer required in the cancer centers. If you would like for your care team to wear a mask while they are taking care of you, please let them know. You may have one support person who is at least 81 years old accompany you for your appointments.

## 2023-06-01 NOTE — Patient Instructions (Signed)
[name]  Discharge Instructions: Thank you for choosing [name] to provide your oncology and hematology care.  If you have a lab appointment with the [name] - please note that after April 8th, 2024, all labs will be drawn in the [name].  You do not have to check in or register with the main entrance as you have in the past but will complete your check-in in the [name].  Wear comfortable clothing and clothing appropriate for easy access to any Portacath or PICC line.   We strive to give you quality time with your provider. You may need to reschedule your appointment if you arrive late (15 or more minutes).  Arriving late affects you and other patients whose appointments are after yours.  Also, if you miss three or more appointments without notifying the office, you may be dismissed from the clinic at the provider's discretion.      For prescription refill requests, have your pharmacy contact our office and allow 72 hours for refills to be completed.    Today you received the following chemotherapy and/or immunotherapy agents [facility]   To help prevent nausea and vomiting after your treatment, we encourage you to take your nausea medication as directed.  Daratumumab; Hyaluronidase Injection What is this medication? DARATUMUMAB; HYALURONIDASE (dar a toom ue mab; hye al ur ON i dase) treats multiple myeloma, a type of bone marrow cancer. Daratumumab works by blocking a protein that causes cancer cells to grow and multiply. This helps to slow or stop the spread of cancer cells. Hyaluronidase works by increasing the absorption of other medications in the body to help them work better. This medication may also be used treat amyloidosis, a condition that causes the buildup of a protein (amyloid) in your body. It works by reducing the buildup of this protein, which decreases symptoms. It is a combination medication that contains a monoclonal  antibody. This medicine may be used for other purposes; ask your health care provider or pharmacist if you have questions. COMMON BRAND NAME(S): DARZALEX FASPRO What should I tell my care team before I take this medication? They need to know if you have any of these conditions: Heart disease Infection, such as chickenpox, cold sores, herpes, hepatitis B Lung or breathing disease An unusual or allergic reaction to daratumumab, hyaluronidase, other medications, foods, dyes, or preservatives Pregnant or trying to get pregnant Breast-feeding How should I use this medication? This medication is injected under the skin. It is given by your care team in a hospital or clinic setting. Talk to your care team about the use of this medication in children. Special care may be needed. Overdosage: If you think you have taken too much of this medicine contact a poison [name] or emergency room at once. NOTE: This medicine is only for you. Do not share this medicine with others. What if I miss a dose? Keep appointments for follow-up doses. It is important not to miss your dose. Call your care team if you are unable to keep an appointment. What may interact with this medication? Interactions have not been studied. This list may not describe all possible interactions. Give your health care provider a list of all the medicines, herbs, non-prescription drugs, or dietary supplements you use. Also tell them if you smoke, drink alcohol, or use illegal drugs. Some items may interact with your medicine. What should I watch for while using this medication? Your condition will be monitored   carefully while you are receiving this medication. This medication can cause serious allergic reactions. To reduce your risk, your care team may give you other medication to take before receiving this one. Be sure to follow the directions from your care team. This medication can affect the results of blood tests to match your  blood type. These changes can last for up to 6 months after the final dose. Your care team will do blood tests to match your blood type before you start treatment. Tell all of your care team that you are being treated with this medication before receiving a blood transfusion. This medication can affect the results of some tests used to determine treatment response; extra tests may be needed to evaluate response. Talk to your care team if you wish to become pregnant or think you are pregnant. This medication can cause serious birth defects if taken during pregnancy and for 3 months after the last dose. A reliable form of contraception is recommended while taking this medication and for 3 months after the last dose. Talk to your care team about effective forms of contraception. Do not breast-feed while taking this medication. What side effects may I notice from receiving this medication? Side effects that you should report to your care team as soon as possible: Allergic reactions--skin rash, itching, hives, swelling of the face, lips, tongue, or throat Heart rhythm changes--fast or irregular heartbeat, dizziness, feeling faint or lightheaded, chest pain, trouble breathing Infection--fever, chills, cough, sore throat, wounds that don't heal, pain or trouble when passing urine, general feeling of discomfort or being unwell Infusion reactions--chest pain, shortness of breath or trouble breathing, feeling faint or lightheaded Sudden eye pain or change in vision such as blurry vision, seeing halos around lights, vision loss Unusual bruising or bleeding Side effects that usually do not require medical attention (report to your care team if they continue or are bothersome): Constipation Diarrhea Fatigue Nausea Pain, tingling, or numbness in the hands or feet Swelling of the ankles, hands, or feet This list may not describe all possible side effects. Call your doctor for medical advice about side effects.  You may report side effects to FDA at 1-800-FDA-1088. Where should I keep my medication? This medication is given in a hospital or clinic. It will not be stored at home. NOTE: This sheet is a summary. It may not cover all possible information. If you have questions about this medicine, talk to your doctor, pharmacist, or health care provider.  2024 Elsevier/Gold Standard (2021-11-24 00:00:00)   BELOW ARE SYMPTOMS THAT SHOULD BE REPORTED IMMEDIATELY: *FEVER GREATER THAN 100.4 F (38 C) OR HIGHER *CHILLS OR SWEATING *NAUSEA AND VOMITING THAT IS NOT CONTROLLED WITH YOUR NAUSEA MEDICATION *UNUSUAL SHORTNESS OF BREATH *UNUSUAL BRUISING OR BLEEDING *URINARY PROBLEMS (pain or burning when urinating, or frequent urination) *BOWEL PROBLEMS (unusual diarrhea, constipation, pain near the anus) TENDERNESS IN MOUTH AND THROAT WITH OR WITHOUT PRESENCE OF ULCERS (sore throat, sores in mouth, or a toothache) UNUSUAL RASH, SWELLING OR PAIN  UNUSUAL VAGINAL DISCHARGE OR ITCHING   Items with * indicate a potential emergency and should be followed up as soon as possible or go to the Emergency Department if any problems should occur.  Please show the CHEMOTHERAPY ALERT CARD or IMMUNOTHERAPY ALERT CARD at check-in to the Emergency Department and triage nurse.  Should you have questions after your visit or need to cancel or reschedule your appointment, please contact [name] [phone]  and follow the prompts.    Office hours are 8:00 a.m. to 4:30 p.m. Monday - Friday. Please note that voicemails left after 4:00 p.m. may not be returned until the following business day.  We are closed weekends and major holidays. You have access to a nurse at all times for urgent questions. Please call the main number to the clinic [phone] and follow the prompts.  For any non-urgent questions, you may also contact your provider using MyChart. We now offer e-Visits for anyone 18 and older to request  care online for non-urgent symptoms. For details visit [email or web address].   Also download the MyChart app! Go to the app store, search "MyChart", open the app, select [facility], and log in with your MyChart username and password.   

## 2023-06-01 NOTE — Progress Notes (Signed)
Patient presents today for [name] infusion. Patient is in satisfactory condition with no new complaints voiced.  Vital signs are stable.  Labs reviewed by Dr. Ellin Saba during the office visit and all labs are within treatment parameters. Patient will receive 40 mEq potassium chloride p.o x 1 dose and 4g IV magnesium sulfate per Dr.K. We will proceed with treatment per MD orders.   Treatment given today per MD orders. Tolerated infusion without adverse affects. Vital signs stable. No complaints at this time. Discharged from clinic via wheelchair in stable condition. Alert and oriented x 3. F/U with [facility] as scheduled.

## 2023-06-01 NOTE — Progress Notes (Signed)
Patient has been assessed, vital signs and labs have been reviewed by Dr. Ellin Saba. ANC, Creatinine, LFTs, and Platelets are within treatment parameters per Dr. Ellin Saba. The patient is good to proceed with treatment at this time. Dr. Ellin Saba is ordering Magnesium 2 g IV and Kdur 40 meq by mouth once today.  Primary RN and pharmacy aware.

## 2023-06-01 NOTE — Progress Notes (Signed)
[facility] [address] [place] [address]   Clinic Day:  06/01/23   Referring physician: [name], MD  Patient Care Team: [name], MD as PCP - General (Internal Medicine) Wyline Mood Dorothe Pea, MD as PCP - Cardiology (Cardiology) Dorisann Frames, MD as Referring Physician (Endocrinology) Doreatha Massed, MD as Medical Oncologist (Medical Oncology) Therese Sarah, RN as Oncology Nurse Navigator (Medical Oncology)   ASSESSMENT & PLAN:   Assessment:  1.  IgG kappa multiple myeloma: - Presentation with malignant hypercalcemia since 01/23/2023 - 04/08/2023: M spike-5.1 g, kappa light chains 872, lambda light chains 4.1, ratio 212.68.  Beta-2 microglobulin 3.6.  LDH 200. - BMBX (04/08/2023): Lambda restricted plasma cells involving approximately 50% of the cellular marrow. - Cytogenetics: 46, XX[20]  2.  Social/family history: - Prior to recent hospitalization, she was living by herself at home.  She is currently resident at [facility] in [place].  Her 2 brothers live in [place].  She is a retired Engineer, site.  Non-smoker.  1 brother had thyroid cancer.  Another brother had prostate cancer.  Father had lung cancer.  Plan:  1.  IgG kappa multiple myeloma: - She received Darzalex cycle 1 day 1 on 04/26/2023.  Last treatment was day 15 on 05/10/2023. - She was hospitalized from 05/11/2023 through 05/18/2023 with acute kidney injury, cystitis secondary to E. coli.  She had to receive 1 unit PRBC. - We reviewed multiple myeloma labs from 05/16/2023.  M spike improved to 3.3 g from 5.1 g prior to treatment.  Kappa light chains improved to 385 from 872.  Free light chain ratio improved to 116 from 212. - She had port placed on 05/23/2023 due to difficult venous access. - I have reviewed her labs.  White count and platelet count is normal.  Hemoglobin is 8.6.  Creatinine was normal at 0.77. - I have reviewed her nursing home  medications.  She is on Prolia every 6 months which we plan to discontinue.  She will be started on zoledronic acid once she is stable. - We will resume her single agent Darzalex weekly starting today. - She has not started Revlimid due to difficulty with delivery of the medication.   2.  ID prophylaxis: - Continue acyclovir twice daily.  Continue aspirin 81 mg for thromboprophylaxis.  3.  Hypoalbuminemia: - Likely from combination of myeloma and malnutrition. - Continue high protein supplements.  Albumin today improved to 1.6.  4.  Hypokalemia: - She is taking potassium 20 mEq daily.  She will receive oral supplements today.  5.  Hypomagnesemia: - Magnesium today is 1.4.  She will receive 4 g IV magnesium.  If it continues to be low, will start her on oral magnesium.  6.  Low back pain: - She has low back pain on movement. - Continue tramadol 50 mg as needed.   Orders Placed This Encounter  Procedures   Comprehensive metabolic panel    Standing Status:   Future    Standing Expiration Date:   06/07/2024   CBC with Differential    Standing Status:   Future    Standing Expiration Date:   06/07/2024   CBC with Differential    Standing Status:   Future    Standing Expiration Date:   06/14/2024   Comprehensive metabolic panel    Standing Status:   Future    Standing Expiration Date:   06/21/2024   CBC with Differential  Standing Status:   Future    Standing Expiration Date:   06/21/2024   Comprehensive metabolic panel    Standing Status:   Future    Standing Expiration Date:   06/28/2024   CBC with Differential    Standing Status:   Future    Standing Expiration Date:   06/28/2024   Comprehensive metabolic panel    Standing Status:   Future    Standing Expiration Date:   07/05/2024   CBC with Differential    Standing Status:   Future    Standing Expiration Date:   07/05/2024   Comprehensive metabolic panel    Standing Status:   Future    Standing Expiration Date:    07/19/2024   CBC with Differential    Standing Status:   Future    Standing Expiration Date:   07/19/2024   Comprehensive metabolic panel    Standing Status:   Future    Standing Expiration Date:   08/02/2024   CBC with Differential    Standing Status:   Future    Standing Expiration Date:   08/02/2024   Comprehensive metabolic panel    Standing Status:   Future    Standing Expiration Date:   08/16/2024   CBC with Differential    Standing Status:   Future    Standing Expiration Date:   08/16/2024       Mikeal Hawthorne R Teague,acting as a scribe for Doreatha Massed, MD.,have documented all relevant documentation on the behalf of Doreatha Massed, MD,as directed by  Doreatha Massed, MD while in the presence of Doreatha Massed, MD.   I, Doreatha Massed MD, have reviewed the above documentation for accuracy and completeness, and I agree with the above.     Doreatha Massed, MD   10/30/20242:47 PM  CHIEF COMPLAINT/PURPOSE OF CONSULT:   Diagnosis: Multiple myeloma   Cancer Staging  Multiple myeloma (HCC) Staging form: Plasma Cell Myeloma and Plasma Cell Disorders, AJCC 8th Edition - Clinical stage from 04/14/2023: Beta-2-microglobulin (mg/L): 3.6, Albumin (g/dL): 1.5, ISS: Stage II, LDH: Elevated - Unsigned    Prior Therapy: none  Current Therapy: Daratumumab, lenalidomide and dexamethasone   HISTORY OF PRESENT ILLNESS:   Oncology History  Multiple myeloma (HCC)  04/14/2023 Initial Diagnosis   Multiple myeloma (HCC)   04/26/2023 -  Chemotherapy   Patient is on Treatment Plan : MYELOMA Newly Diagnosed Daratumumab SQ + Lenalidomide + Dexamethasone (DaraRd) q28d         Kathy [name] is a 82 y.o. female presenting to clinic today for evaluation of Multiple myeloma at the request of [name], MD.  Patient was recently in the ED on 04/06/23 for multiple falls and hypercalcemia where she was seen by me. Labs were consistent with MM and bone marrow biopsy and PET  were ordered. BMBX on 04/08/23 revealed: lambda restricted plasma cell neoplasm involving approximately 50% of the cellular marrow consistent with plasma cell myeloma the appropriate clinical/serologic setting. The peripheral blood found macrocytic anemia.   Today, she states that she is doing well overall. Her appetite level is at 50%. Her energy level is at 50%.  She reports decreased appetite, but attempts to eat. She drinks 3 Boosts a day. She is currently residing at a rehab facility called [facility] in [place], [place]. She has physical therapy there and is walking some. She has not been given a definite date to be discharged from rehab facility. She believes she is getting stronger. She denies any PE's or DVT's.  She does not have any family members that live close by, but she does a a close friend that lives close and checks on her daily. Her sister-in-law will be staying with her when she starts living at home.  INTERVAL HISTORY:   Kema Reding Smoak is a 82 y.o. female presenting to the clinic today for follow-up of MM. She was last seen by me on 05/10/23.  Since her last visit, she was admitted to the hospital on 05/11/23 for an AKI and UTI. She was given IV fluids, Rocephin, and 1 unit of PRBC.   Of note, she had her port inserted on 05/23/23.   Today, she states that she is doing well overall. Her appetite level is at 50%. Her energy level is at 50%.   PAST MEDICAL HISTORY:   Past Medical History: Past Medical History:  Diagnosis Date   Abnormal findings on diagnostic imaging of heart and coronary circulation    Anemia    FROM BLEEDING ULCER   Anxiety    takes Alprazolam daily as needed   Arthritis    dx with RA 2017   Bariatric surgery status    Cataract    Diverticulosis    Gastro-esophageal reflux disease with esophagitis    Gastrojejunal ulcer with hemorrhage    Headache(784.0)    occasionally   History of blood transfusion    no abnormal reaction noted   History  of bronchitis    last time many yrs ago   Hyperlipidemia    PT DENIES THIS DX -  ON NO MEDS AND NO ONE HAS TOLD HER   Hypertension    takes Amlodipine daily   Hypothyroidism    takes Synthroid daily   Insomnia    Joint pain    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   Localized edema    Nocturia    Numbness    occasionally left arm at night   Obesity    Osteoporosis    takes Fosamax weekly   Pain in joint involving pelvic region and thigh    Peripheral edema    takes Lasix daily as needed   PONV (postoperative nausea and vomiting)    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/2017   Rheumatoid arthritis, unspecified (HCC)    Rotator cuff arthropathy, 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   Sleep apnea    Phreesia 08/22/2020   Stomach ulcer    Thyroid disease    Phreesia 08/22/2020   Unspecified injury of muscle(s) and tendon(s) of the rotator cuff of left shoulder, subsequent encounter    Unspecified osteoarthritis, unspecified site    Wears glasses    Wears partial dentures     Surgical History: Past Surgical History:  Procedure Laterality Date   ABDOMINAL HYSTERECTOMY     partial   cataract surgery Bilateral    CHOLECYSTECTOMY     COLONOSCOPY     ESOPHAGOGASTRODUODENOSCOPY (EGD) WITH PROPOFOL N/A 01/11/2022   Procedure: ESOPHAGOGASTRODUODENOSCOPY (EGD) WITH PROPOFOL;  Surgeon: Iva Boop, MD;  Location: [facility] ENDOSCOPY;  Service: Gastroenterology;  Laterality: N/A;   EYE SURGERY     CATARACTS BOTH   gastric bypass surgery     HOT HEMOSTASIS N/A 01/11/2022   Procedure: HOT HEMOSTASIS (ARGON PLASMA COAGULATION/BICAP);  Surgeon: Iva Boop, MD;  Location: [facility] ENDOSCOPY;  Service: Gastroenterology;  Laterality: N/A;   IR IMAGING GUIDED PORT INSERTION  05/23/2023   JOINT REPLACEMENT Bilateral    hip   REVERSE SHOULDER ARTHROPLASTY Right 05/11/2016   Procedure: REVERSE  SHOULDER ARTHROPLASTY;  Surgeon: Teryl Lucy, MD;  Location: [facility] OR;  Service: Orthopedics;   Laterality: Right;   REVISION TOTAL HIP ARTHROPLASTY Left 10/03/2013   DR Turner Daniels   ROTATOR CUFF REPAIR     SCLEROTHERAPY  01/11/2022   Procedure: Susa Day;  Surgeon: Iva Boop, MD;  Location: [facility] ENDOSCOPY;  Service: Gastroenterology;;   TOTAL HIP REVISION Left 10/03/2013   Procedure: TOTAL HIP REVISION- left;  Surgeon: Nestor Lewandowsky, MD;  Location: [facility] OR;  Service: Orthopedics;  Laterality: Left;   TOTAL KNEE ARTHROPLASTY Left 11/29/2017   Procedure: LEFT TOTAL KNEE ARTHROPLASTY;  Surgeon: Teryl Lucy, MD;  Location: [facility] OR;  Service: Orthopedics;  Laterality: Left;   TOTAL SHOULDER ARTHROPLASTY Left 11/09/2016   Procedure: TOTAL REVERSE SHOULDER ARTHROPLASTY;  Surgeon: Teryl Lucy, MD;  Location: [facility] OR;  Service: Orthopedics;  Laterality: Left;   TOTAL SHOULDER REPLACEMENT Left 10/2016   TOTAL THYROIDECTOMY     UPPER GASTROINTESTINAL ENDOSCOPY      Social History: Social History   Socioeconomic History   Marital status: Divorced    Spouse name: Not on file   Number of children: 0   Years of education: Not on file   Highest education level: Not on file  Occupational History   Occupation: disabled  Tobacco Use   Smoking status: Never    Passive exposure: Never   Smokeless tobacco: Never  Vaping Use   Vaping status: Never Used  Substance and Sexual Activity   Alcohol use: No   Drug use: No   Sexual activity: Not Currently    Birth control/protection: Surgical  Other Topics Concern   Not on file  Social History Narrative   Lives alone : "at [facility] for rehab right now."   Social Determinants of Health   Financial Resource Strain: Low Risk  (09/14/2021)   Overall Financial Resource Strain (CARDIA)    Difficulty of Paying Living Expenses: Not hard at all  Food Insecurity: Patient Unable To Answer (05/12/2023)   Hunger Vital Sign    Worried About Programme researcher, broadcasting/film/video in the Last Year: Patient unable to answer    Ran Out of Food in the Last Year:  Patient unable to answer  Transportation Needs: Patient Unable To Answer (05/12/2023)   PRAPARE - Transportation    Lack of Transportation (Medical): Patient unable to answer    Lack of Transportation (Non-Medical): Patient unable to answer  Physical Activity: Sufficiently Active (09/14/2021)   Exercise Vital Sign    Days of Exercise per Week: 6 days    Minutes of Exercise per Session: 40 min  Stress: No Stress Concern Present (09/14/2021)   [employer] of Occupational Health - Occupational Stress Questionnaire    Feeling of Stress : Not at all  Social Connections: Moderately Isolated (09/14/2021)   Social Connection and Isolation Panel [NHANES]    Frequency of Communication with Friends and Family: More than three times a week    Frequency of Social Gatherings with Friends and Family: More than three times a week    Attends Religious Services: More than 4 times per year    Active Member of [employer] or Organizations: No    Attends Banker Meetings: Never    Marital Status: Divorced  Catering manager Violence: Patient Unable To Answer (05/12/2023)   Humiliation, Afraid, Rape, and Kick questionnaire    Fear of Current or Ex-Partner: Patient unable to answer    Emotionally Abused: Patient unable to answer    Physically  Abused: Patient unable to answer    Sexually Abused: Patient unable to answer    Family History: Family History  Problem Relation Age of Onset   Diabetes Mother    Congestive Heart Failure Mother    Lung cancer Father    Thrombosis Sister    CAD Brother        CABG   Colon cancer Neg Hx    Esophageal cancer Neg Hx    Stomach cancer Neg Hx    Rectal cancer Neg Hx     Current Medications:  Current Outpatient Medications:    acetaminophen (TYLENOL) 500 MG tablet, Take 1 tablet (500 mg total) by mouth every 6 (six) hours as needed for mild pain, headache or fever (or Fever >/= 101)., Disp: 30 tablet, Rfl: 0   acyclovir (ZOVIRAX) 400 MG tablet, Take  1 tablet (400 mg total) by mouth 2 (two) times daily., Disp: 60 tablet, Rfl: 11   Adalimumab (HUMIRA PEN) 40 MG/0.4ML PNKT, Inject 40 mg into the skin every Saturday., Disp: , Rfl:    Aspirin 81 MG CAPS, Take 1 capsule by mouth daily., Disp: , Rfl:    denosumab (PROLIA) 60 MG/ML SOSY injection, 60mg  Subcutaneous every 6 months 180 days, Disp: 1 mL, Rfl: 1   ferrous sulfate 325 (65 FE) MG EC tablet, Take 1 tablet (325 mg total) by mouth daily., Disp: 60 tablet, Rfl: 3   furosemide (LASIX) 20 MG tablet, Take 1 tablet (20 mg total) by mouth 2 (two) times daily. (Patient taking differently: Take 20 mg by mouth 2 (two) times daily.), Disp: 180 tablet, Rfl: 3   levothyroxine (SYNTHROID) 137 MCG tablet, Take 0.5 tablets (68.5 mcg total) by mouth daily at 6 (six) AM., Disp: , Rfl:    midodrine (PROAMATINE) 2.5 MG tablet, Take 3 tablets (7.5 mg total) by mouth 3 (three) times daily with meals., Disp: , Rfl:    Multiple Vitamin (MULTIVITAMIN WITH MINERALS) TABS tablet, Take 1 tablet by mouth every morning., Disp: , Rfl:    naloxone (NARCAN) nasal spray 4 mg/0.1 mL, Place 1 spray into the nose as needed (opioid reversal)., Disp: , Rfl:    Omega-3 Fatty Acids (FISH OIL) 1000 MG CAPS, Take 1 capsule by mouth daily., Disp: , Rfl:    omeprazole (PRILOSEC) 40 MG capsule, Take 1 capsule (40 mg total) by mouth daily before breakfast. Open capsule and swallow granules with liquid or applesauce, Disp: 90 capsule, Rfl: 3   ondansetron (ZOFRAN) 4 MG tablet, Take 4 mg by mouth every 6 (six) hours as needed for nausea or vomiting., Disp: , Rfl:    oxybutynin (DITROPAN-XL) 10 MG 24 hr tablet, Take 10 mg by mouth at bedtime., Disp: , Rfl:    polyethylene glycol (MIRALAX / GLYCOLAX) 17 g packet, Take 17 g by mouth daily., Disp: 14 each, Rfl: 0   potassium chloride SA (KLOR-CON M) 20 MEQ tablet, Take 1 tablet (20 mEq total) by mouth daily. TAKE 1 TABLET (20 MEQ) BY MOUTH DAILY PRN with Lasix (EDEMA), Disp: 90 tablet, Rfl: 3    traMADol (ULTRAM) 50 MG tablet, Take 1 tablet (50 mg total) by mouth every 8 (eight) hours as needed., Disp: 12 tablet, Rfl: 0 No current facility-administered medications for this visit.  Facility-Administered Medications Ordered in Other Visits:    0.9 %  sodium chloride infusion, , Intravenous, Continuous, Doreatha Massed, MD, Last Rate: 10 mL/hr at 06/01/23 1445, New Bag at 06/01/23 1445   daratumumab-hyaluronidase-fihj (DARZALEX FASPRO) 1800-30000 MG-UT/15ML chemo  SQ injection 1,800 mg, 1,800 mg, Subcutaneous, Once, Doreatha Massed, MD   magnesium sulfate IVPB 4 g 100 mL, 4 g, Intravenous, Once, Doreatha Massed, MD, Last Rate: 100 mL/hr at 06/01/23 1446, 4 g at 06/01/23 1446   Allergies: Allergies  Allergen Reactions   Macrobid [Nitrofurantoin] Other (See Comments)    Stomach pain    REVIEW OF SYSTEMS:   Review of Systems  Constitutional:  Negative for chills, fatigue and fever.  HENT:   Negative for lump/mass, mouth sores, nosebleeds, sore throat and trouble swallowing.   Eyes:  Negative for eye problems.  Respiratory:  Positive for cough. Negative for shortness of breath.   Cardiovascular:  Positive for palpitations. Negative for chest pain and leg swelling.  Gastrointestinal:  Negative for abdominal pain, constipation, diarrhea, nausea and vomiting.  Genitourinary:  Negative for bladder incontinence, difficulty urinating, dysuria, frequency, hematuria and nocturia.   Musculoskeletal:  Negative for arthralgias, back pain, flank pain, myalgias and neck pain.  Skin:  Negative for itching and rash.  Neurological:  Negative for dizziness, headaches and numbness.  Hematological:  Does not bruise/bleed easily.  Psychiatric/Behavioral:  Positive for sleep disturbance. Negative for depression and suicidal ideas. The patient is not nervous/anxious.   All other systems reviewed and are negative.    VITALS:   Blood pressure 118/74, pulse 85, temperature 98.8 F (37.1  C), temperature source Oral, resp. rate 16, SpO2 99%.  Wt Readings from Last 3 Encounters:  05/23/23 154 lb (69.9 kg)  05/10/23 141 lb (64 kg)  04/10/23 154 lb 15.7 oz (70.3 kg)    There is no height or weight on file to calculate BMI.  Performance status (ECOG): 2 - Symptomatic, <50% confined to bed  PHYSICAL EXAM:   Physical Exam Vitals and nursing note reviewed. Exam conducted with a chaperone present.  Constitutional:      Appearance: Normal appearance.  Cardiovascular:     Rate and Rhythm: Normal rate and regular rhythm.     Pulses: Normal pulses.     Heart sounds: Normal heart sounds.  Pulmonary:     Effort: Pulmonary effort is normal.     Breath sounds: Normal breath sounds.  Abdominal:     Palpations: Abdomen is soft. There is no hepatomegaly, splenomegaly or mass.     Tenderness: There is no abdominal tenderness.  Musculoskeletal:     Right lower leg: No edema.     Left lower leg: No edema.  Lymphadenopathy:     Cervical: No cervical adenopathy.     Right cervical: No superficial, deep or posterior cervical adenopathy.    Left cervical: No superficial, deep or posterior cervical adenopathy.     Upper Body:     Right upper body: No supraclavicular or axillary adenopathy.     Left upper body: No supraclavicular or axillary adenopathy.  Neurological:     General: No focal deficit present.     Mental Status: She is alert and oriented to person, place, and time.  Psychiatric:        Mood and Affect: Mood normal.        Behavior: Behavior normal.     LABS:      Latest Ref Rng & Units 06/01/2023   12:58 PM 05/18/2023    4:11 AM 05/17/2023   10:37 AM  CBC  WBC 4.0 - 10.5 K/uL 4.6  4.7  4.8   Hemoglobin 12.0 - 15.0 g/dL 8.6  7.0  6.9   Hematocrit 36.0 - 46.0 % 26.4  20.0  19.9   Platelets 150 - 400 K/uL 195  89  95       Latest Ref Rng & Units 06/01/2023   12:58 PM 05/18/2023    4:11 AM 05/17/2023   10:37 AM  CMP  Glucose 70 - 99 mg/dL 95  93  191    BUN 8 - 23 mg/dL 8  39  44   Creatinine 0.44 - 1.00 mg/dL 4.78  2.95  6.21   Sodium 135 - 145 mmol/L 138  140  143   Potassium 3.5 - 5.1 mmol/L 3.1  3.3  2.9   Chloride 98 - 111 mmol/L 104  115  115   CO2 22 - 32 mmol/L 23  20  18    Calcium 8.9 - 10.3 mg/dL 7.4  6.4  7.0   Total Protein 6.5 - 8.1 g/dL 8.3     Total Bilirubin 0.3 - 1.2 mg/dL 0.4     Alkaline Phos 38 - 126 U/L 178     AST 15 - 41 U/L 18     ALT 0 - 44 U/L 12        No results found for: "CEA1", "CEA" / No results found for: "CEA1", "CEA" No results found for: "PSA1" No results found for: "HYQ657" No results found for: "QIO962"  Lab Results  Component Value Date   TOTALPROTELP 6.6 05/16/2023   ALBUMINELP 2.2 (L) 04/07/2023   A1GS 0.3 04/07/2023   A2GS 0.6 04/07/2023   BETS 5.5 (H) 04/07/2023   GAMS 0.2 (L) 04/07/2023   MSPIKE 5.1 (H) 04/07/2023   SPEI Comment 04/07/2023   Lab Results  Component Value Date   TIBC 111 (L) 05/15/2023   TIBC 184 (L) 01/26/2023   TIBC 305 01/10/2022   FERRITIN 559 (H) 05/15/2023   FERRITIN 101 01/26/2023   FERRITIN 18 01/10/2022   IRONPCTSAT 63 (H) 05/15/2023   IRONPCTSAT 36 (H) 01/26/2023   IRONPCTSAT 21 01/10/2022   Lab Results  Component Value Date   LDH 238 (H) 05/14/2023   LDH 200 (H) 04/08/2023     STUDIES:   IR IMAGING GUIDED PORT INSERTION  Result Date: 05/23/2023 INDICATION: 82 year old female with multiple myeloma not having achieved remission. She has very poor venous access and presents for placement of a port catheter. EXAM: IMPLANTED PORT A CATH PLACEMENT WITH ULTRASOUND AND FLUOROSCOPIC GUIDANCE MEDICATIONS: None. ANESTHESIA/SEDATION: Versed 1 mg IV; Fentanyl 100 mcg IV; administered by the radiology nurse Moderate Sedation Time:  16 minutes The patient's vital signs and level of consciousness were continuously monitored during the procedure by the interventional radiology nurse under my direct supervision. FLUOROSCOPY: Radiation exposure index: 1 mg  reference air kerma COMPLICATIONS: None immediate. PROCEDURE: The right neck and chest was prepped with chlorhexidine, and draped in the usual sterile fashion using maximum barrier technique (cap and mask, sterile gown, sterile gloves, large sterile sheet, hand hygiene and cutaneous antiseptic). Local anesthesia was attained by infiltration with 1% lidocaine with epinephrine. Ultrasound demonstrated patency of the right internal jugular vein, and this was documented with an image. Under real-time ultrasound guidance, this vein was accessed with a 21 gauge micropuncture needle and image documentation was performed. A small dermatotomy was made at the access site with an 11 scalpel. A 0.018" wire was advanced into the SVC and the access needle exchanged for a 24F micropuncture vascular sheath. The 0.018" wire was then removed and a 0.035" wire advanced into the IVC. An appropriate location for the subcutaneous reservoir was  selected below the clavicle and an incision was made through the skin and underlying soft tissues. The subcutaneous tissues were then dissected using a combination of blunt and sharp surgical technique and a pocket was formed. A single lumen power injectable portacatheter [facility]) was then tunneled through the subcutaneous tissues from the pocket to the dermatotomy and the port reservoir placed within the subcutaneous pocket. The venous access site was then serially dilated and a peel away vascular sheath placed over the wire. The wire was removed and the port catheter advanced into position under fluoroscopic guidance. The catheter tip is positioned in the upper right atrium. This was documented with a spot image. The portacatheter was then tested and found to flush and aspirate well. The port was flushed with saline followed by 100 units/mL heparinized saline. The pocket was then closed in two layers using first subdermal inverted interrupted absorbable sutures followed by a  running subcuticular suture. The epidermis was then sealed with Dermabond. The dermatotomy at the venous access site was also closed with Dermabond. IMPRESSION: Successful placement of a right IJ approach Power Port with ultrasound and fluoroscopic guidance. The catheter is ready for use. Electronically Signed   By: Malachy Moan M.D.   On: 05/23/2023 16:24   MR BRAIN WO CONTRAST  Result Date: 05/14/2023 CLINICAL DATA:  Encephalopathy.  Multiple myeloma. EXAM: MRI HEAD WITHOUT CONTRAST TECHNIQUE: Multiplanar, multiecho pulse sequences of the brain and surrounding structures were obtained without intravenous contrast. COMPARISON:  Head CT 05/11/2023.  MRI brain 03/11/2023. FINDINGS: Brain: No acute infarct or hemorrhage. Stable background of mild chronic small-vessel disease. Unchanged temporoparietal predominant atrophy pattern. No acute hydrocephalus or extra-axial collection. No mass or abnormal susceptibility. Vascular: Normal flow voids. Skull and upper cervical spine: Unchanged numerous small T2 hyperintense lesions throughout the skull, compatible with given history of multiple myeloma. Sinuses/Orbits: No acute findings. Other: None. IMPRESSION: 1. No acute intracranial process. 2. Unchanged numerous small T2 hyperintense lesions throughout the skull, compatible with given history of multiple myeloma. Electronically Signed   By: Orvan Falconer M.D.   On: 05/14/2023 19:46   CT Renal Stone Study  Result Date: 05/12/2023 CLINICAL DATA:  Abdominal and flank pain EXAM: CT ABDOMEN AND PELVIS WITHOUT CONTRAST TECHNIQUE: Multidetector CT imaging of the abdomen and pelvis was performed following the standard protocol without IV contrast. RADIATION DOSE REDUCTION: This exam was performed according to the departmental dose-optimization program which includes automated exposure control, adjustment of the mA and/or kV according to patient size and/or use of iterative reconstruction technique. COMPARISON:   04/06/2023 FINDINGS: Lower chest: No acute abnormality. Bibasilar atelectasis. Stable small pericardial effusion. Hepatobiliary: No focal liver abnormality is seen. Status post cholecystectomy. No biliary dilatation. Pancreas: Unremarkable Spleen: Unremarkable Adrenals/Urinary Tract: The adrenal glands are unremarkable. The kidneys are normal in size and position. 7 mm nonobstructing calculus noted within the interpolar region of the left kidney. The kidneys are otherwise unremarkable. The bladder is obscured by streak artifact. Stomach/Bowel: Surgical changes of Roux-en-Y gastric bypass are identified. Moderate descending and sigmoid colonic diverticulosis without superimposed acute inflammatory change. Evaluation of pelvis is slightly limited by streak artifact. The stomach, small bowel, and large bowel are otherwise unremarkable. Appendix normal. No free intraperitoneal gas or fluid. Vascular/Lymphatic: Mild aortoiliac atherosclerotic calcification. No aortic aneurysm. No pathologic adenopathy within the abdomen and pelvis. Reproductive: Obscured by streak artifact Other: No abdominal wall hernia. Moderate diffuse subcutaneous body wall edema. 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 Osseous structures  are diffusely osteopenic. 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 IMPRESSION: 1. No acute intra-abdominal pathology identified. No definite radiographic explanation for the patient's reported symptoms. 2. Mild left nonobstructing nephrolithiasis. No urolithiasis. No hydronephrosis. 3. Moderate distal colonic diverticulosis without superimposed acute inflammatory change. 4. Stable small pericardial effusion. 5.  Aortic Atherosclerosis (ICD10-I70.0). Electronically Signed   By: Helyn Numbers M.D.   On: 05/12/2023 00:24   CT Head Wo Contrast  Result Date: 05/11/2023 CLINICAL DATA:  Altered mental status  EXAM: CT HEAD WITHOUT CONTRAST TECHNIQUE: Contiguous axial images were obtained from the base of the skull through the vertex without intravenous contrast. RADIATION DOSE REDUCTION: This exam was performed according to the departmental dose-optimization program which includes automated exposure control, adjustment of the mA and/or kV according to patient size and/or use of iterative reconstruction technique. COMPARISON:  CT 03/11/2023 01/04/2023, MRI 03/11/2023 FINDINGS: Brain: No acute territorial infarction, hemorrhage or intracranial mass. The ventricles are nonenlarged. Mild chronic small vessel ischemic changes of the white matter Vascular: No hyperdense vessels.  Carotid vascular calcification Skull: No fracture. Heterogeneous lucencies throughout the skull base and calvarium with multiple small lucent lesions, similar compared with recent priors, increased compared to 2018 Sinuses/Orbits: No acute finding. Other: None IMPRESSION: 1. No CT evidence for acute intracranial abnormality. 2. Mild chronic small vessel ischemic changes of the white matter. 3. Heterogeneous lucencies throughout the skull base and calvarium with multiple small lucent lesions, similar compared with recent priors, increased compared to 2018. Correlate for any history myeloma or marrow disease. Electronically Signed   By: Jasmine Pang M.D.   On: 05/11/2023 22:37   DG Chest Portable 1 View  Result Date: 05/11/2023 CLINICAL DATA:  Weakness and confusion.  Altered mental status. EXAM: PORTABLE CHEST 1 VIEW COMPARISON:  Chest radiograph 04/06/2023, CT 03/11/2023 FINDINGS: Mild cardiomegaly. Stable mediastinal contours. Aortic atherosclerosis. Minimal ill-defined opacity in the right infrahilar region, likely tortuous vessels when compared with prior CT. No acute airspace disease, large pleural effusion, or pneumothorax. 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 No change from prior exam. The left shoulder reverse arthroplasty appears intact. IMPRESSION: 1. Mild cardiomegaly. No acute intrathoracic abnormality. 2. 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 Recommend dedicated radiographs of the shoulder. Electronically Signed   By: Narda Rutherford M.D.   On: 05/11/2023 22:03

## 2023-06-02 ENCOUNTER — Other Ambulatory Visit: Payer: Self-pay | Admitting: Hematology

## 2023-06-02 DIAGNOSIS — C9000 Multiple myeloma not having achieved remission: Secondary | ICD-10-CM

## 2023-06-03 NOTE — Progress Notes (Signed)
Attempted to reach nursing staff at [facility] twice to clarify if and how the patient is taking Revlimid as prescribed by Dr. Ellin Saba without success. I have provided my call back number and asked that the nurse call me back.

## 2023-06-06 ENCOUNTER — Other Ambulatory Visit: Payer: Self-pay

## 2023-06-06 ENCOUNTER — Other Ambulatory Visit: Payer: Self-pay | Admitting: Hematology

## 2023-06-06 ENCOUNTER — Telehealth: Payer: Self-pay

## 2023-06-06 DIAGNOSIS — C9000 Multiple myeloma not having achieved remission: Secondary | ICD-10-CM

## 2023-06-06 MED ORDER — MISC. DEVICES MISC: 0 refills | Status: DC

## 2023-06-06 NOTE — Telephone Encounter (Signed)
Another call placed to [facility] requesting call back from patient's nurse regarding medications.

## 2023-06-06 NOTE — Progress Notes (Signed)
Notification received from Gertie Fey, Charity fundraiser at [employer] that Lenalidomide was in Catering manager of nursing's office and was not administered to the patient at all. Per their request, I have faxed a written prescription to administer the medication for 21 days on, 7 days off every 28 days repeating. I have attempted to reach the director of nursing to ensure that all nursing staff are educated on the importance of this medication.

## 2023-06-07 ENCOUNTER — Other Ambulatory Visit: Payer: Self-pay

## 2023-06-07 MED ORDER — MISC. DEVICES MISC: 0 refills | Status: DC

## 2023-06-08 ENCOUNTER — Encounter: Payer: Self-pay | Admitting: Hematology

## 2023-06-08 ENCOUNTER — Inpatient Hospital Stay: Payer: 59

## 2023-06-08 ENCOUNTER — Inpatient Hospital Stay: Payer: 59 | Attending: Hematology

## 2023-06-08 VITALS — Temp 99.4°F

## 2023-06-08 DIAGNOSIS — C9000 Multiple myeloma not having achieved remission: Secondary | ICD-10-CM | POA: Insufficient documentation

## 2023-06-08 DIAGNOSIS — Z5112 Encounter for antineoplastic immunotherapy: Secondary | ICD-10-CM | POA: Insufficient documentation

## 2023-06-08 LAB — MAGNESIUM: Magnesium: 1.4 mg/dL — ABNORMAL LOW (ref 1.7–2.4)

## 2023-06-08 LAB — COMPREHENSIVE METABOLIC PANEL
ALT: 11 U/L (ref 0–44)
AST: 16 U/L (ref 15–41)
Albumin: <1.5 g/dL — ABNORMAL LOW (ref 3.5–5.0)
Alkaline Phosphatase: 185 U/L — ABNORMAL HIGH (ref 38–126)
Anion gap: 10 (ref 5–15)
BUN: 18 mg/dL (ref 8–23)
CO2: 21 mmol/L — ABNORMAL LOW (ref 22–32)
Calcium: 6.7 mg/dL — ABNORMAL LOW (ref 8.9–10.3)
Chloride: 101 mmol/L (ref 98–111)
Creatinine, Ser: 1.09 mg/dL — ABNORMAL HIGH (ref 0.44–1.00)
GFR, Estimated: 51 mL/min — ABNORMAL LOW (ref >60)
Glucose, Bld: 99 mg/dL (ref 70–99)
Potassium: 3.7 mmol/L (ref 3.5–5.1)
Sodium: 132 mmol/L — ABNORMAL LOW (ref 135–145)
Total Bilirubin: 0.6 mg/dL (ref <1.2)
Total Protein: 7.9 g/dL (ref 6.5–8.1)

## 2023-06-08 LAB — CBC WITH DIFFERENTIAL/PLATELET
Abs Immature Granulocytes: 0.25 10*3/uL — ABNORMAL HIGH (ref 0.00–0.07)
Basophils Absolute: 0 10*3/uL (ref 0.0–0.1)
Basophils Relative: 0 %
Eosinophils Absolute: 0 10*3/uL (ref 0.0–0.5)
Eosinophils Relative: 0 %
HCT: 24.2 % — ABNORMAL LOW (ref 36.0–46.0)
Hemoglobin: 8.2 g/dL — ABNORMAL LOW (ref 12.0–15.0)
Immature Granulocytes: 2 %
Lymphocytes Relative: 7 %
Lymphs Abs: 0.9 10*3/uL (ref 0.7–4.0)
MCH: 33.3 pg (ref 26.0–34.0)
MCHC: 33.9 g/dL (ref 30.0–36.0)
MCV: 98.4 fL (ref 80.0–100.0)
Monocytes Absolute: 1.1 10*3/uL — ABNORMAL HIGH (ref 0.1–1.0)
Monocytes Relative: 8 %
Neutro Abs: 11.8 10*3/uL — ABNORMAL HIGH (ref 1.7–7.7)
Neutrophils Relative %: 83 %
Platelets: 164 10*3/uL (ref 150–400)
RBC: 2.46 MIL/uL — ABNORMAL LOW (ref 3.87–5.11)
RDW: 17.8 % — ABNORMAL HIGH (ref 11.5–15.5)
WBC: 14.1 10*3/uL — ABNORMAL HIGH (ref 4.0–10.5)
nRBC: 0 % (ref 0.0–0.2)

## 2023-06-08 MED ORDER — SODIUM CHLORIDE 0.9% FLUSH
10.0000 mL | Freq: Once | INTRAVENOUS | Status: AC
  Administered 2023-06-08: 10 mL via INTRAVENOUS

## 2023-06-08 MED ORDER — SODIUM CHLORIDE 0.9 % IV SOLN: INTRAVENOUS | Status: DC

## 2023-06-08 MED ORDER — DEXAMETHASONE 4 MG PO TABS
20.0000 mg | ORAL_TABLET | Freq: Once | ORAL | Status: AC
  Administered 2023-06-08: 20 mg via ORAL
  Filled 2023-06-08: qty 5

## 2023-06-08 MED ORDER — ACETAMINOPHEN 325 MG PO TABS
650.0000 mg | ORAL_TABLET | Freq: Once | ORAL | Status: AC
Start: 2023-06-08 — End: 2023-06-08
  Administered 2023-06-08: 650 mg via ORAL
  Filled 2023-06-08: qty 2

## 2023-06-08 MED ORDER — SODIUM CHLORIDE 0.9% FLUSH
10.0000 mL | INTRAVENOUS | Status: DC | PRN
  Administered 2023-06-08: 10 mL via INTRAVENOUS

## 2023-06-08 MED ORDER — MAGNESIUM SULFATE 4 GM/100ML IV SOLN
4.0000 g | Freq: Once | INTRAVENOUS | Status: AC
  Administered 2023-06-08: 4 g via INTRAVENOUS
  Filled 2023-06-08: qty 100

## 2023-06-08 MED ORDER — DARATUMUMAB-HYALURONIDASE-FIHJ 1800-30000 MG-UT/15ML ~~LOC~~ SOLN
1800.0000 mg | Freq: Once | SUBCUTANEOUS | Status: AC
  Administered 2023-06-08: 1800 mg via SUBCUTANEOUS
  Filled 2023-06-08: qty 15

## 2023-06-08 MED ORDER — HEPARIN SOD (PORK) LOCK FLUSH 100 UNIT/ML IV SOLN
500.0000 [IU] | Freq: Once | INTRAVENOUS | Status: AC
  Administered 2023-06-08: 500 [IU] via INTRAVENOUS

## 2023-06-08 NOTE — Progress Notes (Signed)
OK to treat with HR 103  T.O. Dr Carilyn Goodpasture, PharmD

## 2023-06-08 NOTE — Patient Instructions (Signed)
[facility] - A DEPT OF [facility]  Discharge Instructions: Thank you for choosing [facility] to provide your oncology and hematology care.  If you have a lab appointment with the Cancer Center - please note that after April 8th, 2024, all labs will be drawn in the cancer center.  You do not have to check in or register with the main entrance as you have in the past but will complete your check-in in the cancer center.  Wear comfortable clothing and clothing appropriate for easy access to any Portacath or PICC line.   We strive to give you quality time with your provider. You may need to reschedule your appointment if you arrive late (15 or more minutes).  Arriving late affects you and other patients whose appointments are after yours.  Also, if you miss three or more appointments without notifying the office, you may be dismissed from the clinic at the provider's discretion.      For prescription refill requests, have your pharmacy contact our office and allow 72 hours for refills to be completed.    Today you received the following chemotherapy and/or immunotherapy agents [place]   To help prevent nausea and vomiting after your treatment, we encourage you to take your nausea medication as directed.  Daratumumab; Hyaluronidase Injection What is this medication? DARATUMUMAB; HYALURONIDASE (dar a toom ue mab; hye al ur ON i dase) treats multiple myeloma, a type of bone marrow cancer. Daratumumab works by blocking a protein that causes cancer cells to grow and multiply. This helps to slow or stop the spread of cancer cells. Hyaluronidase works by increasing the absorption of other medications in the body to help them work better. This medication may also be used treat amyloidosis, a condition that causes the buildup of a protein (amyloid) in your body. It works by reducing the buildup of this protein, which decreases symptoms. It is a combination  medication that contains a monoclonal antibody. This medicine may be used for other purposes; ask your health care provider or pharmacist if you have questions. COMMON BRAND NAME(S): DARZALEX FASPRO What should I tell my care team before I take this medication? They need to know if you have any of these conditions: Heart disease Infection, such as chickenpox, cold sores, herpes, hepatitis B Lung or breathing disease An unusual or allergic reaction to daratumumab, hyaluronidase, other medications, foods, dyes, or preservatives Pregnant or trying to get pregnant Breast-feeding How should I use this medication? This medication is injected under the skin. It is given by your care team in a hospital or clinic setting. Talk to your care team about the use of this medication in children. Special care may be needed. Overdosage: If you think you have taken too much of this medicine contact a poison control center or emergency room at once. NOTE: This medicine is only for you. Do not share this medicine with others. What if I miss a dose? Keep appointments for follow-up doses. It is important not to miss your dose. Call your care team if you are unable to keep an appointment. What may interact with this medication? Interactions have not been studied. This list may not describe all possible interactions. Give your health care provider a list of all the medicines, herbs, non-prescription drugs, or dietary supplements you use. Also tell them if you smoke, drink alcohol, or use illegal drugs. Some items may interact with your medicine. What should I watch for while  using this medication? Your condition will be monitored carefully while you are receiving this medication. This medication can cause serious allergic reactions. To reduce your risk, your care team may give you other medication to take before receiving this one. Be sure to follow the directions from your care team. This medication can affect the  results of blood tests to match your blood type. These changes can last for up to 6 months after the final dose. Your care team will do blood tests to match your blood type before you start treatment. Tell all of your care team that you are being treated with this medication before receiving a blood transfusion. This medication can affect the results of some tests used to determine treatment response; extra tests may be needed to evaluate response. Talk to your care team if you wish to become pregnant or think you are pregnant. This medication can cause serious birth defects if taken during pregnancy and for 3 months after the last dose. A reliable form of contraception is recommended while taking this medication and for 3 months after the last dose. Talk to your care team about effective forms of contraception. Do not breast-feed while taking this medication. What side effects may I notice from receiving this medication? Side effects that you should report to your care team as soon as possible: Allergic reactions--skin rash, itching, hives, swelling of the face, lips, tongue, or throat Heart rhythm changes--fast or irregular heartbeat, dizziness, feeling faint or lightheaded, chest pain, trouble breathing Infection--fever, chills, cough, sore throat, wounds that don't heal, pain or trouble when passing urine, general feeling of discomfort or being unwell Infusion reactions--chest pain, shortness of breath or trouble breathing, feeling faint or lightheaded Sudden eye pain or change in vision such as blurry vision, seeing halos around lights, vision loss Unusual bruising or bleeding Side effects that usually do not require medical attention (report to your care team if they continue or are bothersome): Constipation Diarrhea Fatigue Nausea Pain, tingling, or numbness in the hands or feet Swelling of the ankles, hands, or feet This list may not describe all possible side effects. Call your doctor for  medical advice about side effects. You may report side effects to FDA at 1-800-FDA-1088. Where should I keep my medication? This medication is given in a hospital or clinic. It will not be stored at home. NOTE: This sheet is a summary. It may not cover all possible information. If you have questions about this medicine, talk to your doctor, pharmacist, or health care provider.  2024 Elsevier/Gold Standard (2021-11-24 00:00:00)   BELOW ARE SYMPTOMS THAT SHOULD BE REPORTED IMMEDIATELY: *FEVER GREATER THAN 100.4 F (38 C) OR HIGHER *CHILLS OR SWEATING *NAUSEA AND VOMITING THAT IS NOT CONTROLLED WITH YOUR NAUSEA MEDICATION *UNUSUAL SHORTNESS OF BREATH *UNUSUAL BRUISING OR BLEEDING *URINARY PROBLEMS (pain or burning when urinating, or frequent urination) *BOWEL PROBLEMS (unusual diarrhea, constipation, pain near the anus) TENDERNESS IN MOUTH AND THROAT WITH OR WITHOUT PRESENCE OF ULCERS (sore throat, sores in mouth, or a toothache) UNUSUAL RASH, SWELLING OR PAIN  UNUSUAL VAGINAL DISCHARGE OR ITCHING   Items with * indicate a potential emergency and should be followed up as soon as possible or go to the Emergency Department if any problems should occur.  Please show the CHEMOTHERAPY ALERT CARD or IMMUNOTHERAPY ALERT CARD at check-in to the Emergency Department and triage nurse.  Should you have questions after your visit or need to cancel or reschedule your appointment, please contact [facility] -  A DEPT OF [facility] [phone]  and follow the prompts.  Office hours are 8:00 a.m. to 4:30 p.m. Monday - Friday. Please note that voicemails left after 4:00 p.m. may not be returned until the following business day.  We are closed weekends and major holidays. You have access to a nurse at all times for urgent questions. Please call the main number to the clinic [phone] and follow the prompts.  For any non-urgent questions, you may also contact your provider  using MyChart. We now offer e-Visits for anyone 12 and older to request care online for non-urgent symptoms. For details visit [email or web address].   Also download the MyChart app! Go to the app store, search "MyChart", open the app, select [place], and log in with your MyChart username and password.

## 2023-06-08 NOTE — Progress Notes (Signed)
Patient presents today for Darzalex Faspro infusion.  Patient is in satisfactory condition with no new complaints voiced.  Vital signs are stable.  Labs reviewed and all labs are within treatment parameters. Pt will receive 4g IV magnesium sulfate x 1 dose per Dr.K's standing orders. We will proceed with treatment per MD orders.    Treatment given today per MD orders. Tolerated infusion without adverse affects. Vital signs stable. No complaints at this time. Discharged from clinic via wheelchair in stable condition. Alert and oriented x 3. F/U with [facility] as scheduled.

## 2023-06-14 ENCOUNTER — Other Ambulatory Visit: Payer: Self-pay

## 2023-06-15 ENCOUNTER — Inpatient Hospital Stay: Payer: 59 | Admitting: Hematology

## 2023-06-15 ENCOUNTER — Encounter: Payer: Self-pay | Admitting: Hematology

## 2023-06-15 ENCOUNTER — Inpatient Hospital Stay: Payer: 59

## 2023-06-15 NOTE — Progress Notes (Signed)
Kathy [name] [address], [place] [address]   Clinic Day:  06/16/23   Referring physician: [name], MD  Patient Care Team: [name], MD as PCP - General (Internal Medicine) Wyline Mood Dorothe Pea, MD as PCP - Cardiology (Cardiology) Dorisann Frames, MD as Referring Physician (Endocrinology) Kathy Massed, MD as Medical Oncologist (Medical Oncology) Therese Sarah, RN as Oncology Nurse Navigator (Medical Oncology)   ASSESSMENT & PLAN:   Assessment:  1.  IgG kappa multiple myeloma: - Presentation with malignant hypercalcemia since 01/23/2023 - 04/08/2023: M spike-5.1 g, kappa light chains 872, lambda light chains 4.1, ratio 212.68.  Beta-2 microglobulin 3.6.  LDH 200. - BMBX (04/08/2023): Lambda restricted plasma cells involving approximately 50% of the cellular marrow. - Cytogenetics: 46, XX[20] - Darzalex cycle 1 day 1 started on 04/26/2023.  Revlimid 10 mg started on 06/09/2023 - Hospitalization from 05/11/2023 through 05/18/2023 with acute kidney injury, cystitis from E. coli.  2.  Social/family history: - Prior to recent hospitalization, she was living by herself at home.  She is currently resident at [facility] in [place].  Her 2 brothers live in [place].  She is a retired Engineer, site.  Non-smoker.  1 brother had thyroid cancer.  Another brother had prostate cancer.  Father had lung cancer.  Plan:  1.  IgG kappa multiple myeloma: - She is here for Darzalex injection. - She reports that she is feeling weak since yesterday. - Her calcium is severely low at 5.8.  Review of medications at nursing home shows that she is also receiving Prolia every 6 months.  Not sure when the last dose was but it could be in September. - We have given 2 g of calcium gluconate. - Her creatinine is elevated at 2.38.  Blood pressure is 86/61 with pulse rate of 109. - I will hold her Darzalex injection.  We will also hold Revlimid.  I  have recommended that she be evaluated in the emergency room.   2.  ID prophylaxis: - Continue acyclovir twice daily.  Continue aspirin 81 mg for thromboprophylaxis.  3.  Hypoalbuminemia: - Likely from combination of myeloma and malnutrition.  Continue protein supplements.  Albumin is 1.5.  4.  Hypokalemia: - She will continue potassium 20 mEq daily.  Potassium is 3.3.  5.  Hypomagnesemia: - Magnesium is 1.5 today.  She will receive magnesium.  6.  Low back pain: - She has low back pain on movement.  Continue tramadol as needed.   No orders of the defined types were placed in this encounter.      Kathy [name],acting as a Neurosurgeon for Kathy Massed, MD.,have documented all relevant documentation on the behalf of Kathy Massed, MD,as directed by  Kathy Massed, MD while in the presence of Kathy Massed, MD.  I, Kathy Massed MD, have reviewed the above documentation for accuracy and completeness, and I agree with the above.      Kathy Massed, MD   11/14/20245:22 PM  CHIEF COMPLAINT/PURPOSE OF CONSULT:   Diagnosis: Multiple myeloma   Cancer Staging  Multiple myeloma (HCC) Staging form: Plasma Cell Myeloma and Plasma Cell Disorders, AJCC 8th Edition - Clinical stage from 04/14/2023: Beta-2-microglobulin (mg/L): 3.6, Albumin (g/dL): 1.5, ISS: Stage II, LDH: Elevated - Unsigned    Prior Therapy: none  Current Therapy: Daratumumab, lenalidomide and dexamethasone   HISTORY OF PRESENT ILLNESS:   Oncology History  Multiple myeloma (HCC)  04/14/2023 Initial Diagnosis   Multiple myeloma (  HCC)   04/26/2023 -  Chemotherapy   Patient is on Treatment Plan : MYELOMA Newly Diagnosed Daratumumab SQ + Lenalidomide + Dexamethasone (DaraRd) q28d         Kathy [name] is a 82 y.o. female presenting to clinic today for evaluation of Multiple myeloma at the request of [name], MD.  Patient was recently in the ED on 04/06/23 for multiple  falls and hypercalcemia where she was seen by me. Labs were consistent with MM and bone marrow biopsy and PET were ordered. BMBX on 04/08/23 revealed: lambda restricted plasma cell neoplasm involving approximately 50% of the cellular marrow consistent with plasma cell myeloma the appropriate clinical/serologic setting. The peripheral blood found macrocytic anemia.   Today, she states that she is doing well overall. Her appetite level is at 50%. Her energy level is at 50%.  She reports decreased appetite, but attempts to eat. She drinks 3 Boosts a day. She is currently residing at a rehab facility called [facility] in [place], [place]. She has physical therapy there and is walking some. She has not been given a definite date to be discharged from rehab facility. She believes she is getting stronger. She denies any PE's or DVT's. She does not have any family members that live close by, but she does a a close friend that lives close and checks on her daily. Her sister-in-law will be staying with her when she starts living at home.  INTERVAL HISTORY:   Kathy [name] is a 82 y.o. female presenting to the clinic today for follow-up of MM. She was last seen by me on 06/01/23.  Today, she states that she is doing well overall. Her appetite level is at 40%. Her energy level is at 0%.   She notes generalized weakness beginning yesterday. She also reports pain throughout the body, particularly in the back that is not a new onset. She denies any diarrhea. She started Revlimid on 06/09/23. She does not remember if she has gotten a Prolia injection . She reports she is doing physical therapy at the nursing home she resides in.   PAST MEDICAL HISTORY:   Past Medical History: Past Medical History:  Diagnosis Date   Abnormal findings on diagnostic imaging of heart and coronary circulation    Anemia    FROM BLEEDING ULCER   Anxiety    takes Alprazolam daily as needed   Arthritis    dx with RA 2017    Bariatric surgery status    Cataract    Diverticulosis    Gastro-esophageal reflux disease with esophagitis    Gastrojejunal ulcer with hemorrhage    Headache(784.0)    occasionally   History of blood transfusion    no abnormal reaction noted   History of bronchitis    last time many yrs ago   Hyperlipidemia    PT DENIES THIS DX -  ON NO MEDS AND NO ONE HAS TOLD HER   Hypertension    takes Amlodipine daily   Hypothyroidism    takes Synthroid daily   Insomnia    Joint pain    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   Localized edema    Nocturia    Numbness    occasionally left arm at night   Obesity    Osteoporosis    takes Fosamax weekly   Pain in joint involving pelvic region and thigh    Peripheral edema    takes Lasix daily as needed   PONV (postoperative nausea and  vomiting)    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/2017   Rheumatoid arthritis, unspecified (HCC)    Rotator cuff arthropathy, 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   Sleep apnea    Phreesia 08/22/2020   Stomach ulcer    Thyroid disease    Phreesia 08/22/2020   Unspecified injury of muscle(s) and tendon(s) of the rotator cuff of left shoulder, subsequent encounter    Unspecified osteoarthritis, unspecified site    Wears glasses    Wears partial dentures     Surgical History: Past Surgical History:  Procedure Laterality Date   ABDOMINAL HYSTERECTOMY     partial   cataract surgery Bilateral    CHOLECYSTECTOMY     COLONOSCOPY     ESOPHAGOGASTRODUODENOSCOPY (EGD) WITH PROPOFOL N/A 01/11/2022   Procedure: ESOPHAGOGASTRODUODENOSCOPY (EGD) WITH PROPOFOL;  Surgeon: Iva Boop, MD;  Location: [facility] ENDOSCOPY;  Service: Gastroenterology;  Laterality: N/A;   EYE SURGERY     CATARACTS BOTH   gastric bypass surgery     HOT HEMOSTASIS N/A 01/11/2022   Procedure: HOT HEMOSTASIS (ARGON PLASMA COAGULATION/BICAP);  Surgeon: Iva Boop, MD;  Location: [facility] ENDOSCOPY;  Service: Gastroenterology;  Laterality:  N/A;   IR IMAGING GUIDED PORT INSERTION  05/23/2023   JOINT REPLACEMENT Bilateral    hip   REVERSE SHOULDER ARTHROPLASTY Right 05/11/2016   Procedure: REVERSE SHOULDER ARTHROPLASTY;  Surgeon: Teryl Lucy, MD;  Location: [facility] OR;  Service: Orthopedics;  Laterality: Right;   REVISION TOTAL HIP ARTHROPLASTY Left 10/03/2013   DR Turner Daniels   ROTATOR CUFF REPAIR     SCLEROTHERAPY  01/11/2022   Procedure: Susa Day;  Surgeon: Iva Boop, MD;  Location: [facility] ENDOSCOPY;  Service: Gastroenterology;;   TOTAL HIP REVISION Left 10/03/2013   Procedure: TOTAL HIP REVISION- left;  Surgeon: Nestor Lewandowsky, MD;  Location: [facility] OR;  Service: Orthopedics;  Laterality: Left;   TOTAL KNEE ARTHROPLASTY Left 11/29/2017   Procedure: LEFT TOTAL KNEE ARTHROPLASTY;  Surgeon: Teryl Lucy, MD;  Location: [facility] OR;  Service: Orthopedics;  Laterality: Left;   TOTAL SHOULDER ARTHROPLASTY Left 11/09/2016   Procedure: TOTAL REVERSE SHOULDER ARTHROPLASTY;  Surgeon: Teryl Lucy, MD;  Location: [facility] OR;  Service: Orthopedics;  Laterality: Left;   TOTAL SHOULDER REPLACEMENT Left 10/2016   TOTAL THYROIDECTOMY     UPPER GASTROINTESTINAL ENDOSCOPY      Social History: Social History   Socioeconomic History   Marital status: Divorced    Spouse name: Not on file   Number of children: 0   Years of education: Not on file   Highest education level: Not on file  Occupational History   Occupation: disabled  Tobacco Use   Smoking status: Never    Passive exposure: Never   Smokeless tobacco: Never  Vaping Use   Vaping status: Never Used  Substance and Sexual Activity   Alcohol use: No   Drug use: No   Sexual activity: Not Currently    Birth control/protection: Surgical  Other Topics Concern   Not on file  Social History Narrative   Lives alone : "at [facility] for rehab right now."   Social Determinants of Health   Financial Resource Strain: Low Risk  (09/14/2021)   Overall Financial Resource Strain (CARDIA)     Difficulty of Paying Living Expenses: Not hard at all  Food Insecurity: Patient Unable To Answer (05/12/2023)   Hunger Vital Sign    Worried About Radiation protection practitioner of Food in the Last Year: Patient unable to answer    Ran Out of Food  in the Last Year: Patient unable to answer  Transportation Needs: Patient Unable To Answer (05/12/2023)   PRAPARE - Transportation    Lack of Transportation (Medical): Patient unable to answer    Lack of Transportation (Non-Medical): Patient unable to answer  Physical Activity: Sufficiently Active (09/14/2021)   Exercise Vital Sign    Days of Exercise per Week: 6 days    Minutes of Exercise per Session: 40 min  Stress: No Stress Concern Present (09/14/2021)   [employer] of Occupational Health - Occupational Stress Questionnaire    Feeling of Stress : Not at all  Social Connections: Moderately Isolated (09/14/2021)   Social Connection and Isolation Panel [NHANES]    Frequency of Communication with Friends and Family: More than three times a week    Frequency of Social Gatherings with Friends and Family: More than three times a week    Attends Religious Services: More than 4 times per year    Active Member of [employer] or Organizations: No    Attends Banker Meetings: Never    Marital Status: Divorced  Catering manager Violence: Patient Unable To Answer (05/12/2023)   Humiliation, Afraid, Rape, and Kick questionnaire    Fear of Current or Ex-Partner: Patient unable to answer    Emotionally Abused: Patient unable to answer    Physically Abused: Patient unable to answer    Sexually Abused: Patient unable to answer    Family History: Family History  Problem Relation Age of Onset   Diabetes Mother    Congestive Heart Failure Mother    Lung cancer Father    Thrombosis Sister    CAD Brother        CABG   Colon cancer Neg Hx    Esophageal cancer Neg Hx    Stomach cancer Neg Hx    Rectal cancer Neg Hx     Current Medications: No current  facility-administered medications for this visit. No current outpatient medications on file.  Facility-Administered Medications Ordered in Other Visits:    0.9 %  sodium chloride infusion, , Intravenous, Continuous, Kathy Massed, MD, Last Rate: 10 mL/hr at 06/16/23 1133, Infusion Verify at 06/16/23 1133   0.9 % NaCl with KCl 20 mEq/ L  infusion, , Intravenous, Continuous, Tat, David, MD   acetaminophen (TYLENOL) tablet 650 mg, 650 mg, Oral, Q6H PRN, 650 mg at 06/16/23 1704 **OR** acetaminophen (TYLENOL) suppository 650 mg, 650 mg, Rectal, Q6H PRN, Tat, David, MD   acyclovir (ZOVIRAX) tablet 400 mg, 400 mg, Oral, BID, Tat, Onalee Hua, MD   Melene Muller ON 06/17/2023] ferrous sulfate tablet 325 mg, 325 mg, Oral, Daily, Tat, Onalee Hua, MD   Melene Muller ON 06/17/2023] levothyroxine (SYNTHROID) tablet 68.5 mcg, 68.5 mcg, Oral, Q0600, Tat, Onalee Hua, MD   midodrine (PROAMATINE) tablet 7.5 mg, 7.5 mg, Oral, TID WC, Tat, Onalee Hua, MD, 7.5 mg at 06/16/23 1703   [START ON 06/17/2023] multivitamin with minerals tablet 1 tablet, 1 tablet, Oral, q morning, Tat, David, MD   omega-3 acid ethyl esters (LOVAZA) capsule 1 g, 1 capsule, Oral, Daily, Tat, David, MD, 1 g at 06/16/23 1708   ondansetron (ZOFRAN) tablet 4 mg, 4 mg, Oral, Q6H PRN **OR** ondansetron (ZOFRAN) injection 4 mg, 4 mg, Intravenous, Q6H PRN, Tat, David, MD   oxybutynin (DITROPAN-XL) 24 hr tablet 10 mg, 10 mg, Oral, QHS, Tat, David, MD   pantoprazole (PROTONIX) EC tablet 80 mg, 80 mg, Oral, Daily, Tat, David, MD, 80 mg at 06/16/23 1704   polyethylene glycol (MIRALAX / GLYCOLAX) packet 17  g, 17 g, Oral, Daily, Tat, David, MD, 17 g at 06/16/23 1703   potassium chloride SA (KLOR-CON M) CR tablet 20 mEq, 20 mEq, Oral, Daily, Tat, David, MD, 20 mEq at 06/16/23 1704   traMADol (ULTRAM) tablet 50 mg, 50 mg, Oral, Q8H PRN, Catarina Hartshorn, MD   Allergies: Allergies  Allergen Reactions   Macrobid [Nitrofurantoin] Other (See Comments)    Stomach pain    REVIEW OF SYSTEMS:    Review of Systems  Constitutional:  Negative for chills, fatigue and fever.       +generalized weakness +pain throughout the body  HENT:   Negative for lump/mass, mouth sores, nosebleeds, sore throat and trouble swallowing.   Eyes:  Negative for eye problems.  Respiratory:  Negative for cough and shortness of breath.   Cardiovascular:  Negative for chest pain, leg swelling and palpitations.  Gastrointestinal:  Negative for abdominal pain, constipation, diarrhea, nausea and vomiting.  Genitourinary:  Negative for bladder incontinence, difficulty urinating, dysuria, frequency, hematuria and nocturia.   Musculoskeletal:  Positive for back pain (in lower back, 6/10 severity). Negative for arthralgias, flank pain, myalgias and neck pain.  Skin:  Negative for itching and rash.  Neurological:  Negative for dizziness, headaches and numbness.       +tingling hands  Hematological:  Does not bruise/bleed easily.  Psychiatric/Behavioral:  Negative for depression, sleep disturbance and suicidal ideas. The patient is not nervous/anxious.   All other systems reviewed and are negative.    VITALS:   There were no vitals taken for this visit.  Wt Readings from Last 3 Encounters:  06/16/23 131 lb (59.4 kg)  06/16/23 129 lb 4.8 oz (58.7 kg)  05/23/23 154 lb (69.9 kg)    There is no height or weight on file to calculate BMI.  Performance status (ECOG): 2 - Symptomatic, <50% confined to bed  PHYSICAL EXAM:   Physical Exam Vitals and nursing note reviewed. Exam conducted with a chaperone present.  Constitutional:      Appearance: Normal appearance.  Cardiovascular:     Rate and Rhythm: Normal rate and regular rhythm.     Pulses: Normal pulses.     Heart sounds: Normal heart sounds.  Pulmonary:     Effort: Pulmonary effort is normal.     Breath sounds: Normal breath sounds.  Abdominal:     Palpations: Abdomen is soft. There is no hepatomegaly, splenomegaly or mass.     Tenderness: There is  no abdominal tenderness.  Musculoskeletal:     Right lower leg: No edema.     Left lower leg: No edema.  Lymphadenopathy:     Cervical: No cervical adenopathy.     Right cervical: No superficial, deep or posterior cervical adenopathy.    Left cervical: No superficial, deep or posterior cervical adenopathy.     Upper Body:     Right upper body: No supraclavicular or axillary adenopathy.     Left upper body: No supraclavicular or axillary adenopathy.  Neurological:     General: No focal deficit present.     Mental Status: She is alert and oriented to person, place, and time.  Psychiatric:        Mood and Affect: Mood normal.        Behavior: Behavior normal.     LABS:      Latest Ref Rng & Units 06/16/2023    8:46 AM 06/08/2023    1:02 PM 06/01/2023   12:58 PM  CBC  WBC 4.0 - 10.5  K/uL 6.5  14.1  4.6   Hemoglobin 12.0 - 15.0 g/dL 7.9  8.2  8.6   Hematocrit 36.0 - 46.0 % 21.6  24.2  26.4   Platelets 150 - 400 K/uL 75  164  195       Latest Ref Rng & Units 06/16/2023    8:46 AM 06/08/2023    1:02 PM 06/01/2023   12:58 PM  CMP  Glucose 70 - 99 mg/dL 562  99  95   BUN 8 - 23 mg/dL 61  18  8   Creatinine 0.44 - 1.00 mg/dL 1.30  8.65  7.84   Sodium 135 - 145 mmol/L 137  132  138   Potassium 3.5 - 5.1 mmol/L 3.3  3.7  3.1   Chloride 98 - 111 mmol/L 102  101  104   CO2 22 - 32 mmol/L 21  21  23    Calcium 8.9 - 10.3 mg/dL 5.8  6.7  7.4   Total Protein 6.5 - 8.1 g/dL 7.1  7.9  8.3   Total Bilirubin <1.2 mg/dL 1.3  0.6  0.4   Alkaline Phos 38 - 126 U/L 177  185  178   AST 15 - 41 U/L 14  16  18    ALT 0 - 44 U/L 15  11  12       No results found for: "CEA1", "CEA" / No results found for: "CEA1", "CEA" No results found for: "PSA1" No results found for: "ONG295" No results found for: "CAN125"  Lab Results  Component Value Date   TOTALPROTELP 6.6 05/16/2023   ALBUMINELP 2.2 (L) 04/07/2023   A1GS 0.3 04/07/2023   A2GS 0.6 04/07/2023   BETS 5.5 (H) 04/07/2023   GAMS 0.2  (L) 04/07/2023   MSPIKE 5.1 (H) 04/07/2023   SPEI Comment 04/07/2023   Lab Results  Component Value Date   TIBC 111 (L) 05/15/2023   TIBC 184 (L) 01/26/2023   TIBC 305 01/10/2022   FERRITIN 559 (H) 05/15/2023   FERRITIN 101 01/26/2023   FERRITIN 18 01/10/2022   IRONPCTSAT 63 (H) 05/15/2023   IRONPCTSAT 36 (H) 01/26/2023   IRONPCTSAT 21 01/10/2022   Lab Results  Component Value Date   LDH 238 (H) 05/14/2023   LDH 200 (H) 04/08/2023     STUDIES:   IR IMAGING GUIDED PORT INSERTION  Result Date: 05/23/2023 INDICATION: 82 year old female with multiple myeloma not having achieved remission. She has very poor venous access and presents for placement of a port catheter. EXAM: IMPLANTED PORT A CATH PLACEMENT WITH ULTRASOUND AND FLUOROSCOPIC GUIDANCE MEDICATIONS: None. ANESTHESIA/SEDATION: Versed 1 mg IV; Fentanyl 100 mcg IV; administered by the radiology nurse Moderate Sedation Time:  16 minutes The patient's vital signs and level of consciousness were continuously monitored during the procedure by the interventional radiology nurse under my direct supervision. FLUOROSCOPY: Radiation exposure index: 1 mg reference air kerma COMPLICATIONS: None immediate. PROCEDURE: The right neck and chest was prepped with chlorhexidine, and draped in the usual sterile fashion using maximum barrier technique (cap and mask, sterile gown, sterile gloves, large sterile sheet, hand hygiene and cutaneous antiseptic). Local anesthesia was attained by infiltration with 1% lidocaine with epinephrine. Ultrasound demonstrated patency of the right internal jugular vein, and this was documented with an image. Under real-time ultrasound guidance, this vein was accessed with a 21 gauge micropuncture needle and image documentation was performed. A small dermatotomy was made at the access site with an 11 scalpel. A 0.018" wire was advanced into  the SVC and the access needle exchanged for a 74F micropuncture vascular sheath. The  0.018" wire was then removed and a 0.035" wire advanced into the IVC. An appropriate location for the subcutaneous reservoir was selected below the clavicle and an incision was made through the skin and underlying soft tissues. The subcutaneous tissues were then dissected using a combination of blunt and sharp surgical technique and a pocket was formed. A single lumen power injectable portacatheter [facility]) was then tunneled through the subcutaneous tissues from the pocket to the dermatotomy and the port reservoir placed within the subcutaneous pocket. The venous access site was then serially dilated and a peel away vascular sheath placed over the wire. The wire was removed and the port catheter advanced into position under fluoroscopic guidance. The catheter tip is positioned in the upper right atrium. This was documented with a spot image. The portacatheter was then tested and found to flush and aspirate well. The port was flushed with saline followed by 100 units/mL heparinized saline. The pocket was then closed in two layers using first subdermal inverted interrupted absorbable sutures followed by a running subcuticular suture. The epidermis was then sealed with Dermabond. The dermatotomy at the venous access site was also closed with Dermabond. IMPRESSION: Successful placement of a right IJ approach Power Port with ultrasound and fluoroscopic guidance. The catheter is ready for use. Electronically Signed   By: Malachy Moan M.D.   On: 05/23/2023 16:24

## 2023-06-16 ENCOUNTER — Inpatient Hospital Stay: Payer: 59

## 2023-06-16 ENCOUNTER — Inpatient Hospital Stay: Payer: 59 | Admitting: Hematology

## 2023-06-16 ENCOUNTER — Encounter (HOSPITAL_COMMUNITY): Payer: Self-pay

## 2023-06-16 ENCOUNTER — Other Ambulatory Visit: Payer: Self-pay

## 2023-06-16 ENCOUNTER — Inpatient Hospital Stay (HOSPITAL_COMMUNITY)
Admission: EM | Admit: 2023-06-16 | Discharge: 2023-06-20 | DRG: 682 | Disposition: A | Discharge status: Skilled Nursing Facility | Payer: 59 | Source: Ambulatory Visit | Attending: Internal Medicine | Admitting: Internal Medicine

## 2023-06-16 VITALS — BP 83/59 | HR 109 | Temp 98.7°F | Resp 20

## 2023-06-16 DIAGNOSIS — R1310 Dysphagia, unspecified: Secondary | ICD-10-CM | POA: Diagnosis present

## 2023-06-16 DIAGNOSIS — N390 Urinary tract infection, site not specified: Secondary | ICD-10-CM | POA: Diagnosis not present

## 2023-06-16 DIAGNOSIS — Z96612 Presence of left artificial shoulder joint: Secondary | ICD-10-CM | POA: Diagnosis present

## 2023-06-16 DIAGNOSIS — A419 Sepsis, unspecified organism: Secondary | ICD-10-CM | POA: Diagnosis not present

## 2023-06-16 DIAGNOSIS — Z888 Allergy status to other drugs, medicaments and biological substances status: Secondary | ICD-10-CM

## 2023-06-16 DIAGNOSIS — M069 Rheumatoid arthritis, unspecified: Secondary | ICD-10-CM | POA: Diagnosis present

## 2023-06-16 DIAGNOSIS — M810 Age-related osteoporosis without current pathological fracture: Secondary | ICD-10-CM | POA: Diagnosis present

## 2023-06-16 DIAGNOSIS — Z9842 Cataract extraction status, left eye: Secondary | ICD-10-CM

## 2023-06-16 DIAGNOSIS — Z7983 Long term (current) use of bisphosphonates: Secondary | ICD-10-CM

## 2023-06-16 DIAGNOSIS — D696 Thrombocytopenia, unspecified: Secondary | ICD-10-CM | POA: Diagnosis present

## 2023-06-16 DIAGNOSIS — E890 Postprocedural hypothyroidism: Secondary | ICD-10-CM | POA: Diagnosis not present

## 2023-06-16 DIAGNOSIS — Z794 Long term (current) use of insulin: Secondary | ICD-10-CM | POA: Diagnosis not present

## 2023-06-16 DIAGNOSIS — B9620 Unspecified Escherichia coli [E. coli] as the cause of diseases classified elsewhere: Secondary | ICD-10-CM | POA: Diagnosis present

## 2023-06-16 DIAGNOSIS — E876 Hypokalemia: Secondary | ICD-10-CM | POA: Diagnosis present

## 2023-06-16 DIAGNOSIS — D500 Iron deficiency anemia secondary to blood loss (chronic): Secondary | ICD-10-CM | POA: Diagnosis not present

## 2023-06-16 DIAGNOSIS — I9589 Other hypotension: Secondary | ICD-10-CM | POA: Diagnosis present

## 2023-06-16 DIAGNOSIS — D638 Anemia in other chronic diseases classified elsewhere: Secondary | ICD-10-CM | POA: Diagnosis not present

## 2023-06-16 DIAGNOSIS — Z515 Encounter for palliative care: Secondary | ICD-10-CM | POA: Diagnosis not present

## 2023-06-16 DIAGNOSIS — C9000 Multiple myeloma not having achieved remission: Secondary | ICD-10-CM | POA: Diagnosis present

## 2023-06-16 DIAGNOSIS — Z8249 Family history of ischemic heart disease and other diseases of the circulatory system: Secondary | ICD-10-CM

## 2023-06-16 DIAGNOSIS — N179 Acute kidney failure, unspecified: Principal | ICD-10-CM | POA: Diagnosis present

## 2023-06-16 DIAGNOSIS — I452 Bifascicular block: Secondary | ICD-10-CM | POA: Diagnosis present

## 2023-06-16 DIAGNOSIS — E869 Volume depletion, unspecified: Secondary | ICD-10-CM | POA: Diagnosis present

## 2023-06-16 DIAGNOSIS — Z79890 Hormone replacement therapy (postmenopausal): Secondary | ICD-10-CM

## 2023-06-16 DIAGNOSIS — Z96652 Presence of left artificial knee joint: Secondary | ICD-10-CM | POA: Diagnosis not present

## 2023-06-16 DIAGNOSIS — R7881 Bacteremia: Secondary | ICD-10-CM | POA: Insufficient documentation

## 2023-06-16 DIAGNOSIS — Z90710 Acquired absence of both cervix and uterus: Secondary | ICD-10-CM

## 2023-06-16 DIAGNOSIS — Z7982 Long term (current) use of aspirin: Secondary | ICD-10-CM

## 2023-06-16 DIAGNOSIS — Z8711 Personal history of peptic ulcer disease: Secondary | ICD-10-CM

## 2023-06-16 DIAGNOSIS — D61818 Other pancytopenia: Secondary | ICD-10-CM | POA: Diagnosis not present

## 2023-06-16 DIAGNOSIS — Z79899 Other long term (current) drug therapy: Secondary | ICD-10-CM

## 2023-06-16 DIAGNOSIS — R6520 Severe sepsis without septic shock: Secondary | ICD-10-CM | POA: Diagnosis not present

## 2023-06-16 DIAGNOSIS — E875 Hyperkalemia: Secondary | ICD-10-CM | POA: Diagnosis present

## 2023-06-16 DIAGNOSIS — I10 Essential (primary) hypertension: Secondary | ICD-10-CM | POA: Diagnosis not present

## 2023-06-16 DIAGNOSIS — Z96611 Presence of right artificial shoulder joint: Secondary | ICD-10-CM | POA: Diagnosis not present

## 2023-06-16 DIAGNOSIS — M1712 Unilateral primary osteoarthritis, left knee: Secondary | ICD-10-CM | POA: Diagnosis present

## 2023-06-16 DIAGNOSIS — E785 Hyperlipidemia, unspecified: Secondary | ICD-10-CM | POA: Diagnosis present

## 2023-06-16 DIAGNOSIS — Z9884 Bariatric surgery status: Secondary | ICD-10-CM

## 2023-06-16 DIAGNOSIS — Z9841 Cataract extraction status, right eye: Secondary | ICD-10-CM

## 2023-06-16 DIAGNOSIS — Z7189 Other specified counseling: Secondary | ICD-10-CM | POA: Diagnosis not present

## 2023-06-16 DIAGNOSIS — Z96642 Presence of left artificial hip joint: Secondary | ICD-10-CM | POA: Diagnosis present

## 2023-06-16 LAB — COMPREHENSIVE METABOLIC PANEL WITH GFR
ALT: 15 U/L (ref 0–44)
AST: 14 U/L — ABNORMAL LOW (ref 15–41)
Albumin: 1.5 g/dL — ABNORMAL LOW (ref 3.5–5.0)
Alkaline Phosphatase: 177 U/L — ABNORMAL HIGH (ref 38–126)
Anion gap: 14 (ref 5–15)
BUN: 61 mg/dL — ABNORMAL HIGH (ref 8–23)
CO2: 21 mmol/L — ABNORMAL LOW (ref 22–32)
Calcium: 5.8 mg/dL — CL (ref 8.9–10.3)
Chloride: 102 mmol/L (ref 98–111)
Creatinine, Ser: 2.38 mg/dL — ABNORMAL HIGH (ref 0.44–1.00)
GFR, Estimated: 20 mL/min — ABNORMAL LOW
Glucose, Bld: 101 mg/dL — ABNORMAL HIGH (ref 70–99)
Potassium: 3.3 mmol/L — ABNORMAL LOW (ref 3.5–5.1)
Sodium: 137 mmol/L (ref 135–145)
Total Bilirubin: 1.3 mg/dL — ABNORMAL HIGH
Total Protein: 7.1 g/dL (ref 6.5–8.1)

## 2023-06-16 LAB — COMPREHENSIVE METABOLIC PANEL
ALT: 15 U/L (ref 0–44)
AST: 15 U/L (ref 15–41)
Albumin: <1.5 g/dL — ABNORMAL LOW (ref 3.5–5.0)
Alkaline Phosphatase: 136 U/L — ABNORMAL HIGH (ref 38–126)
Anion gap: 11 (ref 5–15)
BUN: 54 mg/dL — ABNORMAL HIGH (ref 8–23)
CO2: 19 mmol/L — ABNORMAL LOW (ref 22–32)
Calcium: 5.6 mg/dL — CL (ref 8.9–10.3)
Chloride: 110 mmol/L (ref 98–111)
Creatinine, Ser: 2.21 mg/dL — ABNORMAL HIGH (ref 0.44–1.00)
GFR, Estimated: 22 mL/min — ABNORMAL LOW (ref >60)
Glucose, Bld: 113 mg/dL — ABNORMAL HIGH (ref 70–99)
Potassium: 3.3 mmol/L — ABNORMAL LOW (ref 3.5–5.1)
Sodium: 140 mmol/L (ref 135–145)
Total Bilirubin: 1 mg/dL (ref <1.2)
Total Protein: 5.6 g/dL — ABNORMAL LOW (ref 6.5–8.1)

## 2023-06-16 LAB — CBC WITH DIFFERENTIAL/PLATELET
Abs Immature Granulocytes: 0.14 10*3/uL — ABNORMAL HIGH (ref 0.00–0.07)
Basophils Absolute: 0 10*3/uL (ref 0.0–0.1)
Basophils Relative: 0 %
Eosinophils Absolute: 0 10*3/uL (ref 0.0–0.5)
Eosinophils Relative: 1 %
HCT: 21.6 % — ABNORMAL LOW (ref 36.0–46.0)
Hemoglobin: 7.9 g/dL — ABNORMAL LOW (ref 12.0–15.0)
Immature Granulocytes: 2 %
Lymphocytes Relative: 17 %
Lymphs Abs: 1.1 10*3/uL (ref 0.7–4.0)
MCH: 33.8 pg (ref 26.0–34.0)
MCHC: 36.6 g/dL — ABNORMAL HIGH (ref 30.0–36.0)
MCV: 92.3 fL (ref 80.0–100.0)
Monocytes Absolute: 0.5 10*3/uL (ref 0.1–1.0)
Monocytes Relative: 7 %
Neutro Abs: 4.8 10*3/uL (ref 1.7–7.7)
Neutrophils Relative %: 73 %
Platelets: 75 10*3/uL — ABNORMAL LOW (ref 150–400)
RBC: 2.34 MIL/uL — ABNORMAL LOW (ref 3.87–5.11)
RDW: 16.8 % — ABNORMAL HIGH (ref 11.5–15.5)
WBC: 6.5 10*3/uL (ref 4.0–10.5)
nRBC: 0 % (ref 0.0–0.2)

## 2023-06-16 LAB — VITAMIN B12: Vitamin B-12: 2559 pg/mL — ABNORMAL HIGH (ref 180–914)

## 2023-06-16 LAB — URINALYSIS, W/ REFLEX TO CULTURE (INFECTION SUSPECTED)
Bilirubin Urine: NEGATIVE
Glucose, UA: NEGATIVE mg/dL
Ketones, ur: NEGATIVE mg/dL
Nitrite: NEGATIVE
Protein, ur: 30 mg/dL — AB
Specific Gravity, Urine: 1.008 (ref 1.005–1.030)
pH: 6 (ref 5.0–8.0)

## 2023-06-16 LAB — PROCALCITONIN: Procalcitonin: 3.47 ng/mL

## 2023-06-16 LAB — IRON AND TIBC: Iron: 50 ug/dL (ref 28–170)

## 2023-06-16 LAB — TSH: TSH: 2.916 u[IU]/mL (ref 0.350–4.500)

## 2023-06-16 LAB — FOLATE: Folate: 10.1 ng/mL (ref >5.9)

## 2023-06-16 LAB — FERRITIN: Ferritin: 811 ng/mL — ABNORMAL HIGH (ref 11–307)

## 2023-06-16 LAB — MAGNESIUM: Magnesium: 1.5 mg/dL — ABNORMAL LOW (ref 1.7–2.4)

## 2023-06-16 MED ORDER — SODIUM CHLORIDE 0.9 % IV SOLN: 1.0000 g | Freq: Once | INTRAVENOUS | Status: DC

## 2023-06-16 MED ORDER — ACETAMINOPHEN 325 MG PO TABS
650.0000 mg | ORAL_TABLET | Freq: Four times a day (QID) | ORAL | Status: DC | PRN
  Administered 2023-06-16: 650 mg via ORAL
  Filled 2023-06-16 (×2): qty 2

## 2023-06-16 MED ORDER — SODIUM CHLORIDE 0.9 % IV BOLUS
500.0000 mL | Freq: Once | INTRAVENOUS | Status: AC
  Administered 2023-06-16: 500 mL via INTRAVENOUS

## 2023-06-16 MED ORDER — PANTOPRAZOLE SODIUM 40 MG PO TBEC
80.0000 mg | DELAYED_RELEASE_TABLET | Freq: Every day | ORAL | Status: DC
  Administered 2023-06-16 – 2023-06-20 (×5): 80 mg via ORAL
  Filled 2023-06-16 (×5): qty 2

## 2023-06-16 MED ORDER — TRAMADOL HCL 50 MG PO TABS
50.0000 mg | ORAL_TABLET | Freq: Three times a day (TID) | ORAL | Status: DC | PRN
  Administered 2023-06-17 – 2023-06-18 (×2): 50 mg via ORAL
  Filled 2023-06-16 (×3): qty 1

## 2023-06-16 MED ORDER — POTASSIUM CHLORIDE IN NACL 20-0.9 MEQ/L-% IV SOLN: INTRAVENOUS | Status: DC

## 2023-06-16 MED ORDER — ONDANSETRON HCL 4 MG PO TABS: 4.0000 mg | ORAL_TABLET | Freq: Four times a day (QID) | ORAL | Status: DC | PRN

## 2023-06-16 MED ORDER — MAGNESIUM SULFATE 2 GM/50ML IV SOLN: 2.0000 g | Freq: Once | INTRAVENOUS | Status: DC

## 2023-06-16 MED ORDER — OMEGA-3-ACID ETHYL ESTERS 1 G PO CAPS
1.0000 | ORAL_CAPSULE | Freq: Every day | ORAL | Status: DC
  Administered 2023-06-16 – 2023-06-20 (×5): 1 g via ORAL
  Filled 2023-06-16 (×5): qty 1

## 2023-06-16 MED ORDER — MIDODRINE HCL 5 MG PO TABS
7.5000 mg | ORAL_TABLET | Freq: Three times a day (TID) | ORAL | Status: DC
  Administered 2023-06-16 – 2023-06-20 (×12): 7.5 mg via ORAL
  Filled 2023-06-16 (×12): qty 2

## 2023-06-16 MED ORDER — FERROUS SULFATE 325 (65 FE) MG PO TABS
325.0000 mg | ORAL_TABLET | Freq: Every day | ORAL | Status: DC
Start: 2023-06-17 — End: 2023-06-20
  Administered 2023-06-17 – 2023-06-20 (×4): 325 mg via ORAL
  Filled 2023-06-16 (×4): qty 1

## 2023-06-16 MED ORDER — SODIUM CHLORIDE 0.9 % IV SOLN
INTRAVENOUS | Status: AC
Start: 2023-06-16 — End: 2023-06-17

## 2023-06-16 MED ORDER — CALCIUM GLUCONATE-NACL 1-0.675 GM/50ML-% IV SOLN
1.0000 g | Freq: Once | INTRAVENOUS | Status: AC
Start: 2023-06-16 — End: 2023-06-16
  Administered 2023-06-16: 1000 mg via INTRAVENOUS
  Filled 2023-06-16: qty 50

## 2023-06-16 MED ORDER — SODIUM CHLORIDE 0.9 % IV BOLUS
1000.0000 mL | Freq: Once | INTRAVENOUS | Status: AC
  Administered 2023-06-16: 1000 mL via INTRAVENOUS

## 2023-06-16 MED ORDER — MAGNESIUM SULFATE 2 GM/50ML IV SOLN
2.0000 g | Freq: Once | INTRAVENOUS | Status: AC
  Administered 2023-06-16: 2 g via INTRAVENOUS
  Filled 2023-06-16: qty 50

## 2023-06-16 MED ORDER — ADULT MULTIVITAMIN W/MINERALS CH
1.0000 | ORAL_TABLET | Freq: Every morning | ORAL | Status: DC
  Administered 2023-06-17 – 2023-06-20 (×4): 1 via ORAL
  Filled 2023-06-16 (×4): qty 1

## 2023-06-16 MED ORDER — LEVOTHYROXINE SODIUM 137 MCG PO TABS
68.5000 ug | ORAL_TABLET | Freq: Every day | ORAL | Status: DC
  Administered 2023-06-17 – 2023-06-20 (×4): 68.5 ug via ORAL
  Filled 2023-06-16 (×4): qty 1

## 2023-06-16 MED ORDER — ACETAMINOPHEN 650 MG RE SUPP: 650.0000 mg | Freq: Four times a day (QID) | RECTAL | Status: DC | PRN

## 2023-06-16 MED ORDER — ACYCLOVIR 800 MG PO TABS
400.0000 mg | ORAL_TABLET | Freq: Two times a day (BID) | ORAL | Status: DC
  Administered 2023-06-16 – 2023-06-20 (×8): 400 mg via ORAL
  Filled 2023-06-16 (×8): qty 1

## 2023-06-16 MED ORDER — SODIUM CHLORIDE 0.9% FLUSH
10.0000 mL | Freq: Once | INTRAVENOUS | Status: AC
  Administered 2023-06-16: 10 mL via INTRAVENOUS

## 2023-06-16 MED ORDER — POLYETHYLENE GLYCOL 3350 17 G PO PACK
17.0000 g | PACK | Freq: Every day | ORAL | Status: DC
  Administered 2023-06-16 – 2023-06-20 (×4): 17 g via ORAL
  Filled 2023-06-16 (×4): qty 1

## 2023-06-16 MED ORDER — ONDANSETRON HCL 4 MG/2ML IJ SOLN: 4.0000 mg | Freq: Four times a day (QID) | INTRAMUSCULAR | Status: DC | PRN

## 2023-06-16 MED ORDER — POTASSIUM CHLORIDE CRYS ER 20 MEQ PO TBCR
20.0000 meq | EXTENDED_RELEASE_TABLET | Freq: Every day | ORAL | Status: DC
  Administered 2023-06-16 – 2023-06-17 (×2): 20 meq via ORAL
  Filled 2023-06-16 (×2): qty 1

## 2023-06-16 MED ORDER — HEPARIN SOD (PORK) LOCK FLUSH 100 UNIT/ML IV SOLN: 500.0000 [IU] | Freq: Once | INTRAVENOUS | Status: DC

## 2023-06-16 MED ORDER — SODIUM CHLORIDE 0.9 % IV SOLN: 2.0000 g | Freq: Once | INTRAVENOUS | Status: DC

## 2023-06-16 MED ORDER — OXYBUTYNIN CHLORIDE ER 5 MG PO TB24
10.0000 mg | ORAL_TABLET | Freq: Every day | ORAL | Status: DC
  Administered 2023-06-16 – 2023-06-19 (×4): 10 mg via ORAL
  Filled 2023-06-16 (×4): qty 2

## 2023-06-16 NOTE — Hospital Course (Addendum)
82 year old female with history of myeloma, hypertension, GERD, hypothyroidism, OSA presenting with generalized weakness for the last 3 to 4 days prior to admission.  The patient normally resides at [facility] in [address].  The patient presented to the [facility] cancer center for her usual daratumumab injection on 06/16/2023.  Routine blood work was obtained and showed the patient to have acute kidney injury, hypomagnesemia, and hypocalcemia.  The patient was given 2 g calcium and transferred to the emergency department for further evaluation and treatment.  The patient denies any fevers, chills, headache, chest pain, shortness of breath, coughing, hemoptysis, nausea, vomiting, diarrhea, abdominal pain.  There is no hematochezia or melena.  She states that her oral intake has been poor.  At baseline, the patient ambulates with a walker.  She denies any other new medications.  Her last daratumumab injection was on 06/01/2023. In the ED, the patient was afebrile and tachycardic in the 110s.  Blood pressure was initially soft with SBP in the 90s.  The patient was given 1 L normal saline and 2 g magnesium.  BMP showed sodium 137, potassium 3.3, bicarbonate 21, serum creatinine 2.38.  LFTs were unremarkable.  Magnesium 1.5.  Corrected calcium 7.8.  WBC 6.5, hemoglobin 7.9, platelets 75,000.  EKG shows sinus rhythm at a rate bundle branch block. Notably, the patient was recently mated to the hospital from 05/11/2023 to 05/18/2023.  During that hospital admission, the patient was treated for E. coli UTI which was felt to be contributing to the patient's acute metabolic encephalopathy.  She was discharged with cephalexin.  The patient was also transfused 1 unit PRBC.  She was noted to have acute kidney injury which improved with IV fluids.

## 2023-06-16 NOTE — Progress Notes (Signed)
Patients port flushed without difficulty.  Good blood return noted with no bruising or swelling noted at site.  Patient left accessed for possible fluids.

## 2023-06-16 NOTE — Progress Notes (Signed)
CRITICAL VALUE ALERT Critical value received:  Calcium 5.8 Date of notification:  06-16-23 Time of notification: 0929 Critical value read back:  Yes.   Nurse who received alert:  C. Larkin Alfred RN MD notified time and response:  0930, Dr. Ellin Saba. Will give calcium gluconate as ordered per MD.

## 2023-06-16 NOTE — ED Provider Notes (Signed)
EMERGENCY DEPARTMENT AT [facility] Provider Note   CSN: [id number] Arrival date & time: 06/16/23  1116     History  Chief Complaint  Patient presents with   Weakness    Darlean Heinicke Rusk is a 82 y.o. female.  Presents the ER today from the cancer center for admission for hypocalcemia and hypomagnesemia.  She has multiple myeloma and is being treated with Daratumumab injections.  she presented to the cancer center today for her infusion from [facility].  She denies any complaints but had labs drawn prior to her infusion showed calcium of 5.8, potassium 3.3, magnesium of 1.6.  Sent to the ER for admission   Weakness      Home Medications Prior to Admission medications   Medication Sig Start Date End Date Taking? Authorizing Provider  acetaminophen (TYLENOL) 500 MG tablet Take 1 tablet (500 mg total) by mouth every 6 (six) hours as needed for mild pain, headache or fever (or Fever >/= 101). 03/16/23   Shon Hale, MD  acyclovir (ZOVIRAX) 400 MG tablet Take 1 tablet (400 mg total) by mouth 2 (two) times daily. 04/26/23   Doreatha Massed, MD  Adalimumab (HUMIRA PEN) 40 MG/0.4ML PNKT Inject 40 mg into the skin every Saturday.    [provider]  Aspirin 81 MG CAPS Take 1 capsule by mouth daily.    [provider]  denosumab (PROLIA) 60 MG/ML SOSY injection 60mg  Subcutaneous every 6 months 180 days 04/06/23     ferrous sulfate 325 (65 FE) MG EC tablet Take 1 tablet (325 mg total) by mouth daily. 03/16/23   Shon Hale, MD  furosemide (LASIX) 20 MG tablet Take 1 tablet (20 mg total) by mouth 2 (two) times daily. Patient taking differently: Take 20 mg by mouth 2 (two) times daily. 03/16/22   [name], MD  levothyroxine (SYNTHROID) 137 MCG tablet Take 0.5 tablets (68.5 mcg total) by mouth daily at 6 (six) AM. 05/19/23   Marrion Coy, MD  midodrine (PROAMATINE) 2.5 MG tablet Take 3 tablets (7.5 mg total) by mouth 3  (three) times daily with meals. 05/18/23 06/17/23  Marrion Coy, MD  Misc. Devices MISC Please administer Lenalidomide (Revlimid) once daily for 21 days on, 7 days off. This repeats every 4 weeks. 06/07/23   Doreatha Massed, MD  Multiple Vitamin (MULTIVITAMIN WITH MINERALS) TABS tablet Take 1 tablet by mouth every morning.    [provider]  naloxone [facility]) nasal spray 4 mg/0.1 mL Place 1 spray into the nose as needed (opioid reversal).    [provider]  Omega-3 Fatty Acids (FISH OIL) 1000 MG CAPS Take 1 capsule by mouth daily. 03/17/23   [provider]  omeprazole (PRILOSEC) 40 MG capsule Take 1 capsule (40 mg total) by mouth daily before breakfast. Open capsule and swallow granules with liquid or applesauce 04/15/22   Iva Boop, MD  ondansetron (ZOFRAN) 4 MG tablet Take 4 mg by mouth every 6 (six) hours as needed for nausea or vomiting.    [provider]  oxybutynin (DITROPAN-XL) 10 MG 24 hr tablet Take 10 mg by mouth at bedtime. 03/16/23   [provider]  polyethylene glycol (MIRALAX / GLYCOLAX) 17 g packet Take 17 g by mouth daily. 04/13/23   Sherryll Burger, Pratik D, DO  potassium chloride SA (KLOR-CON M) 20 MEQ tablet Take 1 tablet (20 mEq total) by mouth daily. TAKE 1 TABLET (20 MEQ) BY MOUTH DAILY PRN with Lasix (EDEMA)  03/16/23   Shon Hale, MD  traMADol (ULTRAM) 50 MG tablet Take 1 tablet (50 mg total) by mouth every 8 (eight) hours as needed. 05/18/23   Marrion Coy, MD      Allergies    Macrobid [nitrofurantoin]    Review of Systems   Review of Systems  Neurological:  Positive for weakness.    Physical Exam Updated Vital Signs BP 104/61   Pulse 93   Temp 98.2 F (36.8 C) (Oral)   Resp 15   Ht 5\' 1"  (1.549 m)   Wt 59.4 kg   SpO2 98%   BMI 24.75 kg/m  Physical Exam Vitals and nursing note reviewed.  Constitutional:      General: She is not in acute distress.    Appearance: She is well-developed.  HENT:     Head:  Normocephalic and atraumatic.  Eyes:     Conjunctiva/sclera: Conjunctivae normal.  Cardiovascular:     Rate and Rhythm: Normal rate and regular rhythm.     Heart sounds: No murmur heard. Pulmonary:     Effort: Pulmonary effort is normal. No respiratory distress.     Breath sounds: Normal breath sounds.  Abdominal:     Palpations: Abdomen is soft.     Tenderness: There is no abdominal tenderness.  Musculoskeletal:        General: No swelling.     Cervical back: Neck supple.  Skin:    General: Skin is warm and dry.     Capillary Refill: Capillary refill takes less than 2 seconds.  Neurological:     General: No focal deficit present.     Mental Status: She is alert and oriented to person, place, and time.  Psychiatric:        Mood and Affect: Mood normal.     ED Results / Procedures / Treatments   Labs (all labs ordered are listed, but only abnormal results are displayed) Labs Reviewed - No data to display  EKG None  Radiology No results found.  Procedures Procedures    Medications Ordered in ED Medications  magnesium sulfate IVPB 2 g 50 mL (2 g Intravenous New Bag/Given 06/16/23 1357)  sodium chloride 0.9 % bolus 1,000 mL (0 mLs Intravenous Stopped 06/16/23 1412)    ED Course/ Medical Decision Making/ A&P                                 Medical Decision Making This patient presents to the ED for concern of AKI, hypocalcemia, hypomagnesemia, this involves an extensive number of treatment options, and is a complaint that carries with it a high risk of complications and morbidity.  The differential diagnosis includes dehydration, medication side effect, other   Co morbidities that complicate the patient evaluation :  multiple myeloma   Additional history obtained:  Additional history obtained from EMR External records from outside source obtained and reviewed including notes, prior labs   Lab Tests:  Reviewed labs from oncology visit today  which reveal  5.8, BUN is 61 creatinine is 2.38, magnesium is 1.5, potassium is 3.3 hemoglobin is 7.9, platelets are 75     Cardiac Monitoring: / EKG:  The patient was maintained on a cardiac monitor.  I personally viewed and interpreted the cardiac monitored which showed an underlying rhythm of: sinus rhythm with RBBB   Consultations Obtained:  I requested consultation with the hospitalist, Dr. Arbutus Leas,  and discussed lab and imaging findings  as well as pertinent plan - they recommend: admission, he will see the patient in the ED   Problem List / ED Course / Critical interventions / Medication management  AKI-patient reports decreased p.o. intake possibly prerenal versus medication side effect.  Her at the oncology office, blood pressure after IV fluids here has normalized, heart rate normal. Hypocalcemia-patient already ordered 2 g of calcium in the office at oncology which has been given, slightly prolonged QTc on her EKG compared to prior hypomagnesemia-patient repleted I ordered medication including IV fluids  for AKI  Reevaluation of the patient after these medicines showed that the patient improved I have reviewed the patients home medicines and have made adjustments as needed   Social Determinants of Health:  Pt lives in rehab facility     Risk Prescription drug management.           Final Clinical Impression(s) / ED Diagnoses Final diagnoses:  AKI (acute kidney injury) (HCC)  Hypocalcemia  Hypomagnesemia    Rx / DC Orders ED Discharge Orders     None         Josem Kaufmann 06/16/23 1420    Bethann Berkshire, MD 06/18/23 1007

## 2023-06-16 NOTE — Progress Notes (Signed)
Patient presents today for Daratumumab injection.  Patient is in satisfactory condition with no new complaints voiced.  Vital signs are stable.  Labs reviewed by Dr. Ellin Saba during her office visit.  Calcium today is 5.8.  We will give calcium gluconate 2 grams IV x one dose today per MD.    We will hold Daratumumab injection today per Dr. Dionicia Abler.  Patient transported to the ED at 1110 by NT per Dr. Ellin Saba due to abnormal labs.  Patient was transported with calcium gluconate infusing.  Patient in stable condition at transport.  Call report given to the ED by A. Dareen Piano, RN.

## 2023-06-16 NOTE — Progress Notes (Signed)
Report called to Deanna, ED Charge RN. Pt transported via wheelchair in c/o nurse tech to ED 16.

## 2023-06-16 NOTE — Patient Instructions (Signed)
[facility] - A DEPT OF [facility]  Discharge Instructions: Thank you for choosing [facility] to provide your oncology and hematology care.  If you have a lab appointment with the Cancer Center - please note that after April 8th, 2024, all labs will be drawn in the cancer center.  You do not have to check in or register with the main entrance as you have in the past but will complete your check-in in the cancer center.  Wear comfortable clothing and clothing appropriate for easy access to any Portacath or PICC line.   We strive to give you quality time with your provider. You may need to reschedule your appointment if you arrive late (15 or more minutes).  Arriving late affects you and other patients whose appointments are after yours.  Also, if you miss three or more appointments without notifying the office, you may be dismissed from the clinic at the provider's discretion.      For prescription refill requests, have your pharmacy contact our office and allow 72 hours for refills to be completed.    Today you received the following:  Calcium gluconate.  Calcium Gluconate Injection What is this medication? CALCIUM GLUCONATE (KAL see um GLOO koe nate) increases calcium levels in your body. Calcium is a mineral that plays an important role in building strong bones and maintaining heart health. This medicine may be used for other purposes; ask your health care provider or pharmacist if you have questions. What should I tell my care team before I take this medication? They need to know if you have any of these conditions: High levels of calcium in the blood History of irregular heartbeat History of kidney stones Kidney disease Parathyroid disease An unusual or allergic reaction to calcium, other medications, foods, dyes, or preservatives Pregnant or trying to get pregnant Breast-feeding How should I use this medication? This medication is  injected into a vein. It is given by your care team in a hospital or clinic setting. Talk to your care team about the use of this medication in children. While it may be given to children for selected conditions, precautions do apply. Overdosage: If you think you have taken too much of this medicine contact a poison control center or emergency room at once. NOTE: This medicine is only for you. Do not share this medicine with others. What if I miss a dose? This does not apply. What may interact with this medication? Ceftriaxone Certain diuretics Digoxin Other calcium products This list may not describe all possible interactions. Give your health care provider a list of all the medicines, herbs, non-prescription drugs, or dietary supplements you use. Also tell them if you smoke, drink alcohol, or use illegal drugs. Some items may interact with your medicine. What should I watch for while using this medication? Your condition will be monitored carefully while you are receiving this medication. You may need blood work while you are taking this medication. What side effects may I notice from receiving this medication? Side effects that you should report to your care team as soon as possible: Allergic reactions--skin rash, itching, hives, swelling of the face, lips, tongue, or throat Fast or irregular heartbeat High calcium level--increased thirst or amount of urine, nausea, vomiting, confusion, unusual weakness or fatigue, bone pain Low blood pressure--dizziness, feeling faint or lightheaded, blurry vision Pain, redness, or irritation at injection site Side effects that usually do not require medical attention (report to your care  team if they continue or are bothersome): Change in taste Flushing, mostly over the face, neck, and chest, during injection This list may not describe all possible side effects. Call your doctor for medical advice about side effects. You may report side effects to FDA at  1-800-FDA-1088. Where should I keep my medication? This medication is given in a hospital or clinic. It will not be stored at home. NOTE: This sheet is a summary. It may not cover all possible information. If you have questions about this medicine, talk to your doctor, pharmacist, or health care provider.  2024 Elsevier/Gold Standard (2022-02-08 00:00:00)       To help prevent nausea and vomiting after your treatment, we encourage you to take your nausea medication as directed.  BELOW ARE SYMPTOMS THAT SHOULD BE REPORTED IMMEDIATELY: *FEVER GREATER THAN 100.4 F (38 C) OR HIGHER *CHILLS OR SWEATING *NAUSEA AND VOMITING THAT IS NOT CONTROLLED WITH YOUR NAUSEA MEDICATION *UNUSUAL SHORTNESS OF BREATH *UNUSUAL BRUISING OR BLEEDING *URINARY PROBLEMS (pain or burning when urinating, or frequent urination) *BOWEL PROBLEMS (unusual diarrhea, constipation, pain near the anus) TENDERNESS IN MOUTH AND THROAT WITH OR WITHOUT PRESENCE OF ULCERS (sore throat, sores in mouth, or a toothache) UNUSUAL RASH, SWELLING OR PAIN  UNUSUAL VAGINAL DISCHARGE OR ITCHING   Items with * indicate a potential emergency and should be followed up as soon as possible or go to the Emergency Department if any problems should occur.  Please show the CHEMOTHERAPY ALERT CARD or IMMUNOTHERAPY ALERT CARD at check-in to the Emergency Department and triage nurse.  Should you have questions after your visit or need to cancel or reschedule your appointment, please contact [facility] - A DEPT OF [facility] [phone]  and follow the prompts.  Office hours are 8:00 a.m. to 4:30 p.m. Monday - Friday. Please note that voicemails left after 4:00 p.m. may not be returned until the following business day.  We are closed weekends and major holidays. You have access to a nurse at all times for urgent questions. Please call the main number to the clinic [phone] and follow the prompts.  For any  non-urgent questions, you may also contact your provider using MyChart. We now offer e-Visits for anyone 29 and older to request care online for non-urgent symptoms. For details visit [email or web address].   Also download the MyChart app! Go to the app store, search "MyChart", open the app, select [email or web address], and log in with your MyChart username and password.

## 2023-06-16 NOTE — H&P (Signed)
History and Physical    Patient: Kathy [name] WUJ:[id number] DOB: 08-15-1940 DOA: 06/16/2023 DOS: the patient was seen and examined on 06/16/2023 PCP: [name], MD  Patient coming from: SNF  Chief Complaint:  Chief Complaint  Patient presents with   Weakness   HPI: Kathy [name] is a 82 year old female with history of myeloma, hypertension, GERD, hypothyroidism, OSA presenting with generalized weakness for the last 3 to 4 days prior to admission.  The patient normally resides at [facility] in [place].  The patient presented to the [facility] cancer center for her usual daratumumab injection on 06/16/2023.  Routine blood work was obtained and showed the patient to have acute kidney injury, hypomagnesemia, and hypocalcemia.  The patient was given 2 g calcium and transferred to the emergency department for further evaluation and treatment.  The patient denies any fevers, chills, headache, chest pain, shortness of breath, coughing, hemoptysis, nausea, vomiting, diarrhea, abdominal pain.  There is no hematochezia or melena.  She states that her oral intake has been poor.  At baseline, the patient ambulates with a walker.  She denies any other new medications.  Her last daratumumab injection was on 06/01/2023. In the ED, the patient was afebrile and tachycardic in the 110s.  Blood pressure was initially soft with SBP in the 90s.  The patient was given 1 L normal saline and 2 g magnesium.  BMP showed sodium 137, potassium 3.3, bicarbonate 21, serum creatinine 2.38.  LFTs were unremarkable.  Magnesium 1.5.  Corrected calcium 7.8.  WBC 6.5, hemoglobin 7.9, platelets 75,000.  EKG shows sinus rhythm at a rate bundle branch block. Notably, the patient was recently mated to the hospital from 05/11/2023 to 05/18/2023.  During that hospital admission, the patient was treated for E. coli UTI which was felt to be contributing to the patient's acute metabolic encephalopathy.  She was discharged  with cephalexin.  The patient was also transfused 1 unit PRBC.  She was noted to have acute kidney injury which improved with IV fluids.  Review of Systems: As mentioned in the history of present illness. All other systems reviewed and are negative. Past Medical History:  Diagnosis Date   Abnormal findings on diagnostic imaging of heart and coronary circulation    Anemia    FROM BLEEDING ULCER   Anxiety    takes Alprazolam daily as needed   Arthritis    dx with RA 2017   Bariatric surgery status    Cataract    Diverticulosis    Gastro-esophageal reflux disease with esophagitis    Gastrojejunal ulcer with hemorrhage    Headache(784.0)    occasionally   History of blood transfusion    no abnormal reaction noted   History of bronchitis    last time many yrs ago   Hyperlipidemia    PT DENIES THIS DX -  ON NO MEDS AND NO ONE HAS TOLD HER   Hypertension    takes Amlodipine daily   Hypothyroidism    takes Synthroid daily   Insomnia    Joint pain    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   Localized edema    Nocturia    Numbness    occasionally left arm at night   Obesity    Osteoporosis    takes Fosamax weekly   Pain in joint involving pelvic region and thigh    Peripheral edema    takes Lasix daily as needed   PONV (postoperative nausea and vomiting)  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/2017   Rheumatoid arthritis, unspecified (HCC)    Rotator cuff arthropathy, 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   Sleep apnea    Phreesia 08/22/2020   Stomach ulcer    Thyroid disease    Phreesia 08/22/2020   Unspecified injury of muscle(s) and tendon(s) of the rotator cuff of left shoulder, subsequent encounter    Unspecified osteoarthritis, unspecified site    Wears glasses    Wears partial dentures    Past Surgical History:  Procedure Laterality Date   ABDOMINAL HYSTERECTOMY     partial   cataract surgery Bilateral    CHOLECYSTECTOMY     COLONOSCOPY      ESOPHAGOGASTRODUODENOSCOPY (EGD) WITH PROPOFOL N/A 01/11/2022   Procedure: ESOPHAGOGASTRODUODENOSCOPY (EGD) WITH PROPOFOL;  Surgeon: Iva Boop, MD;  Location: [facility] ENDOSCOPY;  Service: Gastroenterology;  Laterality: N/A;   EYE SURGERY     CATARACTS BOTH   gastric bypass surgery     HOT HEMOSTASIS N/A 01/11/2022   Procedure: HOT HEMOSTASIS (ARGON PLASMA COAGULATION/BICAP);  Surgeon: Iva Boop, MD;  Location: [facility] ENDOSCOPY;  Service: Gastroenterology;  Laterality: N/A;   IR IMAGING GUIDED PORT INSERTION  05/23/2023   JOINT REPLACEMENT Bilateral    hip   REVERSE SHOULDER ARTHROPLASTY Right 05/11/2016   Procedure: REVERSE SHOULDER ARTHROPLASTY;  Surgeon: Teryl Lucy, MD;  Location: [facility] OR;  Service: Orthopedics;  Laterality: Right;   REVISION TOTAL HIP ARTHROPLASTY Left 10/03/2013   DR Turner Daniels   ROTATOR CUFF REPAIR     SCLEROTHERAPY  01/11/2022   Procedure: Susa Day;  Surgeon: Iva Boop, MD;  Location: [facility] ENDOSCOPY;  Service: Gastroenterology;;   TOTAL HIP REVISION Left 10/03/2013   Procedure: TOTAL HIP REVISION- left;  Surgeon: Nestor Lewandowsky, MD;  Location: [facility] OR;  Service: Orthopedics;  Laterality: Left;   TOTAL KNEE ARTHROPLASTY Left 11/29/2017   Procedure: LEFT TOTAL KNEE ARTHROPLASTY;  Surgeon: Teryl Lucy, MD;  Location: [facility] OR;  Service: Orthopedics;  Laterality: Left;   TOTAL SHOULDER ARTHROPLASTY Left 11/09/2016   Procedure: TOTAL REVERSE SHOULDER ARTHROPLASTY;  Surgeon: Teryl Lucy, MD;  Location: [facility] OR;  Service: Orthopedics;  Laterality: Left;   TOTAL SHOULDER REPLACEMENT Left 10/2016   TOTAL THYROIDECTOMY     UPPER GASTROINTESTINAL ENDOSCOPY     Social History:  reports that she has never smoked. She has never been exposed to tobacco smoke. She has never used smokeless tobacco. She reports that she does not drink alcohol and does not use drugs.  Allergies  Allergen Reactions   Macrobid [Nitrofurantoin] Other (See Comments)    Stomach pain    Family  History  Problem Relation Age of Onset   Diabetes Mother    Congestive Heart Failure Mother    Lung cancer Father    Thrombosis Sister    CAD Brother        CABG   Colon cancer Neg Hx    Esophageal cancer Neg Hx    Stomach cancer Neg Hx    Rectal cancer Neg Hx     Prior to Admission medications   Medication Sig Start Date End Date Taking? Authorizing Provider  acetaminophen (TYLENOL) 500 MG tablet Take 1 tablet (500 mg total) by mouth every 6 (six) hours as needed for mild pain, headache or fever (or Fever >/= 101). 03/16/23   Shon Hale, MD  acyclovir (ZOVIRAX) 400 MG tablet Take 1 tablet (400 mg total) by mouth 2 (two) times daily. 04/26/23   Doreatha Massed, MD  Adalimumab (HUMIRA PEN)  40 MG/0.4ML PNKT Inject 40 mg into the skin every Saturday.    [provider]  Aspirin 81 MG CAPS Take 1 capsule by mouth daily.    [provider]  denosumab (PROLIA) 60 MG/ML SOSY injection 60mg  Subcutaneous every 6 months 180 days 04/06/23     ferrous sulfate 325 (65 FE) MG EC tablet Take 1 tablet (325 mg total) by mouth daily. 03/16/23   Shon Hale, MD  furosemide (LASIX) 20 MG tablet Take 1 tablet (20 mg total) by mouth 2 (two) times daily. Patient taking differently: Take 20 mg by mouth 2 (two) times daily. 03/16/22   [name], MD  levothyroxine (SYNTHROID) 137 MCG tablet Take 0.5 tablets (68.5 mcg total) by mouth daily at 6 (six) AM. 05/19/23   Marrion Coy, MD  midodrine (PROAMATINE) 2.5 MG tablet Take 3 tablets (7.5 mg total) by mouth 3 (three) times daily with meals. 05/18/23 06/17/23  Marrion Coy, MD  Misc. Devices MISC Please administer Lenalidomide (Revlimid) once daily for 21 days on, 7 days off. This repeats every 4 weeks. 06/07/23   Doreatha Massed, MD  Multiple Vitamin (MULTIVITAMIN WITH MINERALS) TABS tablet Take 1 tablet by mouth every morning.    [provider]  naloxone [facility]) nasal spray 4 mg/0.1 mL Place 1 spray into the nose as  needed (opioid reversal).    [provider]  Omega-3 Fatty Acids (FISH OIL) 1000 MG CAPS Take 1 capsule by mouth daily. 03/17/23   [provider]  omeprazole (PRILOSEC) 40 MG capsule Take 1 capsule (40 mg total) by mouth daily before breakfast. Open capsule and swallow granules with liquid or applesauce 04/15/22   Iva Boop, MD  ondansetron (ZOFRAN) 4 MG tablet Take 4 mg by mouth every 6 (six) hours as needed for nausea or vomiting.    [provider]  oxybutynin (DITROPAN-XL) 10 MG 24 hr tablet Take 10 mg by mouth at bedtime. 03/16/23   [provider]  polyethylene glycol (MIRALAX / GLYCOLAX) 17 g packet Take 17 g by mouth daily. 04/13/23   Sherryll Burger, Pratik D, DO  potassium chloride SA (KLOR-CON M) 20 MEQ tablet Take 1 tablet (20 mEq total) by mouth daily. TAKE 1 TABLET (20 MEQ) BY MOUTH DAILY PRN with Lasix (EDEMA) 03/16/23   Shon Hale, MD  traMADol (ULTRAM) 50 MG tablet Take 1 tablet (50 mg total) by mouth every 8 (eight) hours as needed. 05/18/23   Marrion Coy, MD    Physical Exam: Vitals:   06/16/23 1200 06/16/23 1300 06/16/23 1351 06/16/23 1400  BP: 101/70 (!) 100/59  104/61  Pulse:  100  93  Resp: 13 20  15   Temp:   98.2 F (36.8 C)   TempSrc:   Oral   SpO2:  99%  98%  Weight:      Height:       GENERAL:  A&O x 3, NAD, well developed, cooperative, follows commands HEENT: [place]/AT, No thrush, No icterus, No oral ulcers Neck:  No neck mass, No meningismus, soft, supple CV: RRR, no S3, no S4, no rub, no JVD Lungs:  bibasilar crackles. No wheeze Abd: soft/NT +BS, nondistended Ext: No edema, no lymphangitis, no cyanosis, no rashes Neuro:  CN II-XII intact, strength 4/5 in RUE, RLE, strength 4/5 LUE, LLE; sensation intact bilateral; no dysmetria; babinski equivocal  Data Reviewed:data reviewed above in the history  Assessment and Plan: Acute kidney injury -Secondary to volume depletion -Baseline creatinine 0.7-1.0 -Presented with serum  creatinine  2.38 -Continue IV fluids -UA/urine culture  Hypocalcemia -Corrected calcium 7.8 -Repeat albumin and calcium 06/17/2023 -Patient given 2 g calcium gluconate  Hypomagnesemia -Replete  Hypokalemia -Replace  Myeloma -follow with Dr. 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 Inpatient status is judged to be reasonable and necessary in order to provide the required intensity of service to ensure the patient's safety. The patient's presenting symptoms, physical exam findings, and initial radiographic and laboratory data in the context of their chronic comorbidities is felt to place them at high risk for further clinical deterioration. Furthermore, it is not anticipated that the patient will be medically stable for discharge from the hospital within 2 midnights of admission.   * I certify that at the point of admission it is my clinical judgment that the patient will require inpatient hospital care spanning beyond 2 midnights from the point of admission due to high intensity of service, high risk for further deterioration and high frequency of surveillance required.*  Author: Catarina Hartshorn, MD 06/16/2023 2:42 PM  For on call review [email or web address].

## 2023-06-16 NOTE — Patient Instructions (Signed)
[facility] at [facility] Discharge Instructions   You were seen and examined today by Dr. Ellin Saba.  He reviewed the results of your lab work. Your calcium is critically low at 5.8. We are giving you IV calcium in the clinic today to correct this. Your magnesium is low at 1.5. We will give you IV magnesium today to correct this. Your hemoglobin is low at 7.9 and your platelet count is low at 75. Your creatinine (the lab that tells [place] how well your kidneys are functioning) is elevated.    We will hold your treatment today. Dr. Kirtland Bouchard recommends you be evaluated in the ER here and get admitted to the hospital to correct the above.   Return as scheduled.    Thank you for choosing [facility] at [facility] to provide your oncology and hematology care.  To afford each patient quality time with our provider, please arrive at least 15 minutes before your scheduled appointment time.   If you have a lab appointment with the Cancer Center please come in thru the Main Entrance and check in at the main information desk.  You need to re-schedule your appointment should you arrive 10 or more minutes late.  We strive to give you quality time with our providers, and arriving late affects you and other patients whose appointments are after yours.  Also, if you no show three or more times for appointments you may be dismissed from the clinic at the providers discretion.     Again, thank you for choosing [facility].  Our hope is that these requests will decrease the amount of time that you wait before being seen by our physicians.       _____________________________________________________________  Should you have questions after your visit to [facility], please contact our office at [phone] and follow the prompts.  Our office hours are 8:00 a.m. and 4:30 p.m. Monday - Friday.  Please note that voicemails left after 4:00 p.m. may not be  returned until the following business day.  We are closed weekends and major holidays.  You do have access to a nurse 24-7, just call the main number to the clinic [phone] and do not press any options, hold on the line and a nurse will answer the phone.    For prescription refill requests, have your pharmacy contact our office and allow 72 hours.    Due to Covid, you will need to wear a mask upon entering the hospital. If you do not have a mask, a mask will be given to you at the Main Entrance upon arrival. For doctor visits, patients may have 1 support person age 1 or older with them. For treatment visits, patients can not have anyone with them due to social distancing guidelines and our immunocompromised population.

## 2023-06-16 NOTE — ED Triage Notes (Signed)
Pt sent to ED from cancer center for evaluation of generalized weakness and abnormal labs. PAC to right chest is accessed upon arrival.

## 2023-06-17 ENCOUNTER — Telehealth: Payer: Self-pay | Admitting: Gastroenterology

## 2023-06-17 ENCOUNTER — Other Ambulatory Visit: Payer: Self-pay

## 2023-06-17 DIAGNOSIS — N179 Acute kidney failure, unspecified: Secondary | ICD-10-CM | POA: Diagnosis not present

## 2023-06-17 DIAGNOSIS — D696 Thrombocytopenia, unspecified: Secondary | ICD-10-CM | POA: Diagnosis not present

## 2023-06-17 DIAGNOSIS — R7881 Bacteremia: Secondary | ICD-10-CM | POA: Insufficient documentation

## 2023-06-17 LAB — CBC
HCT: 17 % — ABNORMAL LOW (ref 36.0–46.0)
HCT: 20.5 % — ABNORMAL LOW (ref 36.0–46.0)
Hemoglobin: 6 g/dL — CL (ref 12.0–15.0)
Hemoglobin: 7 g/dL — ABNORMAL LOW (ref 12.0–15.0)
MCH: 33.5 pg (ref 26.0–34.0)
MCH: 33.5 pg (ref 26.0–34.0)
MCHC: 35.3 g/dL (ref 30.0–36.0)
MCHC: 34.1 g/dL (ref 30.0–36.0)
MCV: 95 fL (ref 80.0–100.0)
MCV: 98.1 fL (ref 80.0–100.0)
Platelets: 62 10*3/uL — ABNORMAL LOW (ref 150–400)
Platelets: 64 10*3/uL — ABNORMAL LOW (ref 150–400)
RBC: 1.79 MIL/uL — ABNORMAL LOW (ref 3.87–5.11)
RBC: 2.09 MIL/uL — ABNORMAL LOW (ref 3.87–5.11)
RDW: 17.1 % — ABNORMAL HIGH (ref 11.5–15.5)
RDW: 18.3 % — ABNORMAL HIGH (ref 11.5–15.5)
WBC: 3.9 10*3/uL — ABNORMAL LOW (ref 4.0–10.5)
WBC: 4.5 10*3/uL (ref 4.0–10.5)
nRBC: 0 % (ref 0.0–0.2)
nRBC: 0 % (ref 0.0–0.2)

## 2023-06-17 LAB — COMPREHENSIVE METABOLIC PANEL
ALT: 15 U/L (ref 0–44)
AST: 23 U/L (ref 15–41)
Albumin: <1.5 g/dL — ABNORMAL LOW (ref 3.5–5.0)
Alkaline Phosphatase: 159 U/L — ABNORMAL HIGH (ref 38–126)
Anion gap: 12 (ref 5–15)
BUN: 50 mg/dL — ABNORMAL HIGH (ref 8–23)
CO2: 16 mmol/L — ABNORMAL LOW (ref 22–32)
Calcium: 5.7 mg/dL — CL (ref 8.9–10.3)
Chloride: 112 mmol/L — ABNORMAL HIGH (ref 98–111)
Creatinine, Ser: 2.12 mg/dL — ABNORMAL HIGH (ref 0.44–1.00)
GFR, Estimated: 23 mL/min — ABNORMAL LOW (ref >60)
Glucose, Bld: 94 mg/dL (ref 70–99)
Potassium: 5.3 mmol/L — ABNORMAL HIGH (ref 3.5–5.1)
Sodium: 140 mmol/L (ref 135–145)
Total Bilirubin: 1.2 mg/dL — ABNORMAL HIGH (ref <1.2)
Total Protein: 6.4 g/dL — ABNORMAL LOW (ref 6.5–8.1)

## 2023-06-17 LAB — BLOOD CULTURE ID PANEL (REFLEXED) - BCID2

## 2023-06-17 LAB — MAGNESIUM: Magnesium: 2.1 mg/dL (ref 1.7–2.4)

## 2023-06-17 LAB — PREPARE RBC (CROSSMATCH)

## 2023-06-17 MED ORDER — PIPERACILLIN-TAZOBACTAM IN DEX 2-0.25 GM/50ML IV SOLN
2.2500 g | Freq: Three times a day (TID) | INTRAVENOUS | Status: DC
  Filled 2023-06-17 (×4): qty 50

## 2023-06-17 MED ORDER — SODIUM CHLORIDE 0.9 % IV SOLN: 2.0000 g | INTRAVENOUS | Status: DC

## 2023-06-17 MED ORDER — SODIUM CHLORIDE 0.9 % IV SOLN
2.2500 g | Freq: Three times a day (TID) | INTRAVENOUS | Status: DC
  Administered 2023-06-17: 2.25 g via INTRAVENOUS
  Filled 2023-06-17 (×2): qty 10
  Filled 2023-06-17: qty 2.25
  Filled 2023-06-17: qty 10

## 2023-06-17 MED ORDER — CHLORHEXIDINE GLUCONATE CLOTH 2 % EX PADS
6.0000 | MEDICATED_PAD | Freq: Every day | CUTANEOUS | Status: DC
  Administered 2023-06-17 – 2023-06-20 (×4): 6 via TOPICAL

## 2023-06-17 MED ORDER — CALCIUM CARBONATE ANTACID 500 MG PO CHEW
1.0000 | CHEWABLE_TABLET | Freq: Three times a day (TID) | ORAL | Status: DC
  Administered 2023-06-17 – 2023-06-20 (×10): 200 mg via ORAL
  Filled 2023-06-17 (×10): qty 1

## 2023-06-17 MED ORDER — CALCIUM GLUCONATE-NACL 1-0.675 GM/50ML-% IV SOLN
1.0000 g | INTRAVENOUS | Status: AC
Start: 2023-06-17 — End: 2023-06-17
  Administered 2023-06-17 (×2): 1000 mg via INTRAVENOUS
  Filled 2023-06-17 (×2): qty 50

## 2023-06-17 MED ORDER — PIPERACILLIN-TAZOBACTAM 3.375 G IVPB
3.3750 g | Freq: Two times a day (BID) | INTRAVENOUS | Status: DC
  Filled 2023-06-17: qty 50

## 2023-06-17 MED ORDER — SODIUM CHLORIDE 0.9 % IV SOLN
2.0000 g | INTRAVENOUS | Status: DC
  Administered 2023-06-17 – 2023-06-18 (×2): 2 g via INTRAVENOUS
  Filled 2023-06-17 (×3): qty 20

## 2023-06-17 MED ORDER — SODIUM CHLORIDE 0.9 % IV SOLN: INTRAVENOUS | Status: AC

## 2023-06-17 MED ORDER — SODIUM CHLORIDE 0.9 % IV SOLN: 1.0000 g | INTRAVENOUS | Status: DC

## 2023-06-17 MED ORDER — POTASSIUM CHLORIDE CRYS ER 20 MEQ PO TBCR
40.0000 meq | EXTENDED_RELEASE_TABLET | Freq: Once | ORAL | Status: AC
  Administered 2023-06-17: 40 meq via ORAL
  Filled 2023-06-17: qty 2

## 2023-06-17 MED ORDER — SODIUM CHLORIDE 0.9% IV SOLUTION
Freq: Once | INTRAVENOUS | Status: AC
Start: 2023-06-17 — End: 2023-06-17

## 2023-06-17 NOTE — TOC Initial Note (Signed)
Transition of Care [facility]) - Initial/Assessment Note    Patient Details  Name: Kathy [name] MRN: [id number] Date of Birth: Mar 29, 1941  [facility]) CM/SW Contact:    Villa Herb, LCSWA Phone Number: 06/17/2023, 11:29 AM  Clinical Narrative:                 Pt is high risk for readmission. CSW spoke to [place] with [employer] who states that pt is a long term resident at their facility and can return at D/C. Tiffany states that pt will not need insurance auth to return. TOC to follow.   Expected Discharge Plan: Long Term Nursing Home Barriers to Discharge: Continued Medical Work up   Patient Goals and CMS Choice Patient states their goals for this hospitalization and ongoing recovery are:: return to [employer] [facility] Compare Post Acute Care list provided to:: Patient Choice offered to / list presented to : Patient      Expected Discharge Plan and Services In-house Referral: Clinical Social Work Discharge Planning Services: CM Consult Post Acute Care Choice: Skilled Nursing Facility Living arrangements for the past 2 months: Skilled Nursing Facility                                      Prior Living Arrangements/Services Living arrangements for the past 2 months: Skilled Nursing Facility Lives with:: Facility Resident Patient language and need for interpreter reviewed:: Yes Do you feel safe going back to the place where you live?: Yes      Need for Family Participation in Patient Care: Yes (Comment) Care giver support system in place?: Yes (comment)   Criminal Activity/Legal Involvement Pertinent to Current Situation/Hospitalization: No - Comment as needed  Activities of Daily Living      Permission Sought/Granted                  Emotional Assessment Appearance:: Appears stated age Attitude/Demeanor/Rapport: Engaged Affect (typically observed): Accepting   Alcohol / Substance Use: Not Applicable Psych  Involvement: No (comment)  Admission diagnosis:  Hypocalcemia [E83.51] Hypomagnesemia [E83.42] AKI (acute kidney injury) (HCC) [N17.9] Patient Active Problem List   Diagnosis Date Noted   Gram-negative bacteremia 06/17/2023   Hypocalcemia 06/16/2023   Hypernatremia 05/18/2023   Protein-calorie malnutrition, severe 05/17/2023   Sepsis secondary to UTI (HCC) 05/12/2023   Electrolyte abnormality 05/12/2023   Thrombocytopenia (HCC) 05/12/2023   AMS (altered mental status) 05/12/2023   Multiple myeloma (HCC) 04/14/2023   Hypercalcemia of malignancy 04/07/2023   General weakness 04/07/2023   Recurrent falls 04/07/2023   Hypotension 04/07/2023   Hypoalbuminemia due to protein-calorie malnutrition (HCC) 04/07/2023   Acute prostatitis 04/07/2023   Chronic kidney disease, stage 3b (HCC) 04/07/2023   Dysphagia 04/07/2023   Acute pancreatitis 04/07/2023   History of thyroid cancer 04/07/2023   Anemia 03/30/2023   Physical deconditioning 03/30/2023   Elevated serum immunoglobulin free light chain level 03/30/2023   Leg swelling 02/21/2023   Hospital discharge follow-up 02/21/2023   Hypomagnesemia 02/21/2023   Urge incontinence of urine 02/21/2023   Hypercalcemia 01/23/2023   AKI (acute kidney injury) (HCC) 01/23/2023   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   Hammer toe of left foot 09/15/2022   Left foot pain 09/15/2022   Iron deficiency anemia due to chronic blood loss 07/19/2022   Rectal bleeding 01/21/2022   Gastrojejunal ulcer with hemorrhage    Encounter for  general adult medical examination with abnormal findings 09/16/2021   Hypokalemia 09/16/2021   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   Bilateral impacted cerumen 11/19/2020   Hearing loss 11/19/2020   Malignant tumor of thyroid gland (HCC) 10/08/2020   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   Polyarthritis    Bariatric surgery status    Primary  localized osteoarthritis of left knee 11/29/2017   Rheumatoid arthritis (HCC) 11/16/2016   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   Rotator cuff arthropathy, 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   S/P shoulder replacement 05/11/2016   Allergic rhinitis 10/19/2013   GERD (gastroesophageal reflux disease) 10/19/2013   Osteoporosis    Essential hypertension    Acquired hypothyroidism    Hyperlipidemia    Anxiety    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   Unspecified protein-calorie malnutrition (HCC) 10/14/2011   Vitamin deficiency 10/14/2011   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 [place] [address] Phone: [phone] Fax: [phone]  [facility] - [place], [place] - [address] [place] [address] Phone: [phone] Fax: [phone]  [facility] [place], [place] - [address] [address] [phone] [facility] [address] [place] [address] Phone: [phone] Fax: [phone]     Social Determinants of Health (SDOH) Social History: SDOH Screenings   Food Insecurity: Patient Unable To Answer (05/12/2023)  Housing: High Risk (05/12/2023)  Transportation Needs: Patient Unable To Answer (05/12/2023)  Utilities: Patient Unable To Answer (05/12/2023)  Alcohol Screen: Low Risk  (09/14/2021)  Depression (PHQ2-9): Low Risk  (03/30/2023)  Financial Resource Strain: Low Risk  (09/14/2021)  Physical Activity: Sufficiently Active (09/14/2021)  Social Connections: Moderately Isolated (09/14/2021)  Stress: No Stress Concern Present (09/14/2021)  Tobacco Use: Low Risk  (06/16/2023)   SDOH Interventions:     Readmission Risk Interventions    06/17/2023   11:10 AM 05/12/2023    1:07 PM 03/16/2023   10:32 AM  Readmission Risk Prevention Plan  Transportation Screening Complete Complete Complete  PCP or  Specialist Appt within 3-5 Days   Complete  HRI or Home Care Consult   Complete  Social Work Consult for Recovery Care Planning/Counseling   Complete  Palliative Care Screening   Not Applicable  Medication Review [employer]) Complete  Complete  PCP or Specialist appointment within 3-5 days of discharge  Complete   HRI or Home Care Consult Complete Complete   SW Recovery Care/Counseling Consult Complete Complete   Palliative Care Screening Not Applicable Not Applicable   Skilled Nursing Facility Complete Complete

## 2023-06-17 NOTE — Progress Notes (Signed)
   06/17/23 1145  Spiritual Encounters  Type of Visit Initial  Care provided to: Patient  Reason for visit Routine spiritual support  OnCall Visit No   Chaplain visited with the patient Kathy [name], during her stay at [facility]. She shared some of her journey and as she recounted that she was feeling better but not well enough to return home. Kathy [name] said her body was telling her not yet.  We both agreed that sometimes our insides know [place] better than we do. Kathy [name] hopes to visit with family this weekend as hat is when they are able to visit. As I departed I encouraged Kathy [name] in her healing process.[facility]  [phone]

## 2023-06-17 NOTE — Progress Notes (Signed)
PROGRESS NOTE  Kathy [name] Jaber KGM:[id number] DOB: [birth year] DOA: 06/16/2023 PCP: [name], MD  Brief History:  82 year old female with history of myeloma, hypertension, GERD, hypothyroidism, OSA presenting with generalized weakness for the last 3 to 4 days prior to admission.  The patient normally resides at [facility] in [place].  The patient presented to the [facility] cancer center for her usual daratumumab injection on 06/16/2023.  Routine blood work was obtained and showed the patient to have acute kidney injury, hypomagnesemia, and hypocalcemia.  The patient was given 2 g calcium and transferred to the emergency department for further evaluation and treatment.  The patient denies any fevers, chills, headache, chest pain, shortness of breath, coughing, hemoptysis, nausea, vomiting, diarrhea, abdominal pain.  There is no hematochezia or melena.  She states that her oral intake has been poor.  At baseline, the patient ambulates with a walker.  She denies any other new medications.  Her last daratumumab injection was on 06/01/2023. In the ED, the patient was afebrile and tachycardic in the 110s.  Blood pressure was initially soft with SBP in the 90s.  The patient was given 1 L normal saline and 2 g magnesium.  BMP showed sodium 137, potassium 3.3, bicarbonate 21, serum creatinine 2.38.  LFTs were unremarkable.  Magnesium 1.5.  Corrected calcium 7.8.  WBC 6.5, hemoglobin 7.9, platelets 75,000.  EKG shows sinus rhythm at a rate bundle branch block. Notably, the patient was recently mated to the hospital from 05/11/2023 to 05/18/2023.  During that hospital admission, the patient was treated for E. coli UTI which was felt to be contributing to the patient's acute metabolic encephalopathy.  She was discharged with cephalexin.  The patient was also transfused 1 unit PRBC.  She was noted to have acute kidney injury which improved with IV fluids.   Assessment/Plan: Acute kidney  injury -Secondary to volume depletion -Baseline creatinine 0.7-1.0 -Presented with serum creatinine 2.38 -Continue IV fluids>>slowly improving -UA 21-50 WBC   Hypocalcemia -Corrected calcium 7.7 -Repeat albumin and calcium 06/17/2023 -give additional 2 g calcium gluconate -add TUMS TID  Gram Negative bacteremia -source likely urine -start zosyn 2 grams daily  Dysphagia/Odynophagia -solids and liquids x 3 months -GI consult  Anemia of chronic disease -baseline Hgb ~8 -drop in Hgb partly dilutional and from blood draws -transfuse one unit PRBC  Pyuria -start empiric zosyn pending culture data   Hypomagnesemia -Replete   Hypokalemia>>hyperkalemia -Replaced initially -now remove KCl from IVF   Myeloma -follow with Dr. 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   Objective: Vitals:   06/16/23 2100 06/16/23 2300 06/17/23 0152 06/17/23 0400  BP: 102/65 94/71 99/61  99/65  Pulse: (!) 114   93  Resp:   20 20  Temp:    97.6 F (36.4 C)  TempSrc:    Oral  SpO2:   100% 100%  Weight:  Height:        Intake/Output Summary (Last 24 hours) at 06/17/2023 0749 Last data filed at 06/17/2023 0531 Gross per 24 hour  Intake 2412.55 ml  Output 150 ml  Net 2262.55 ml   Weight change:  Exam:  General:  Pt is alert, follows commands appropriately, not in acute distress HEENT: No icterus, No  thrush, No neck mass, [place]/AT Cardiovascular: RRR, S1/S2, no rubs, no gallops Respiratory: bibasilar crackles. No wheeze Abdomen: Soft/+BS, non tender, non distended, no guarding Extremities: No edema, No lymphangitis, No petechiae, No rashes, no synovitis   Data Reviewed: I have personally reviewed following labs and imaging studies Basic Metabolic Panel: Recent Labs  Lab 06/16/23 0846 06/16/23 2045 06/17/23 0242  NA 137 140 140  K 3.3* 3.3* 5.3*  CL 102 110 112*  CO2 21* 19* 16*  GLUCOSE 101* 113* 94  BUN 61* 54* 50*  CREATININE 2.38* 2.21* 2.12*  CALCIUM 5.8* 5.6* 5.7*  MG 1.5*  --   --    Liver Function Tests: Recent Labs  Lab 06/16/23 0846 06/16/23 2045 06/17/23 0242  AST 14* 15 23  ALT 15 15 15   ALKPHOS 177* 136* 159*  BILITOT 1.3* 1.0 1.2*  PROT 7.1 5.6* 6.4*  ALBUMIN 1.5* <1.5* <1.5*   No results for input(s): "LIPASE", "AMYLASE" in the last 168 hours. No results for input(s): "AMMONIA" in the last 168 hours. Coagulation Profile: No results for input(s): "INR", "PROTIME" in the last 168 hours. CBC: Recent Labs  Lab 06/16/23 0846 06/17/23 0118 06/17/23 0242  WBC 6.5 3.9* 4.5  NEUTROABS 4.8  --   --   HGB 7.9* 6.0* 7.0*  HCT 21.6* 17.0* 20.5*  MCV 92.3 95.0 98.1  PLT 75* 62* 64*   Cardiac Enzymes: No results for input(s): "CKTOTAL", "CKMB", "CKMBINDEX", "TROPONINI" in the last 168 hours. BNP: Invalid input(s): "POCBNP" CBG: No results for input(s): "GLUCAP" in the last 168 hours. HbA1C: No results for input(s): "HGBA1C" in the last 72 hours. Urine analysis:    Component Value Date/Time   COLORURINE YELLOW 06/16/2023 1615   APPEARANCEUR HAZY (A) 06/16/2023 1615   LABSPEC 1.008 06/16/2023 1615   PHURINE 6.0 06/16/2023 1615   GLUCOSEU NEGATIVE 06/16/2023 1615   HGBUR MODERATE (A) 06/16/2023 1615   BILIRUBINUR NEGATIVE 06/16/2023 1615   KETONESUR NEGATIVE 06/16/2023 1615   PROTEINUR 30 (A) 06/16/2023 1615   UROBILINOGEN 0.2 09/26/2013 1042    NITRITE NEGATIVE 06/16/2023 1615   LEUKOCYTESUR LARGE (A) 06/16/2023 1615   Sepsis Labs: @LABRCNTIP (procalcitonin:4,lacticidven:4) ) Recent Results (from the past 240 hour(s))  Culture, blood (Routine X 2) w Reflex to ID Panel     Status: None (Preliminary result)   Collection Time: 06/16/23  4:39 PM   Specimen: Porta Cath; Blood  Result Value Ref Range Status   Specimen Description PORTA CATH  Final   Special Requests   Final    BOTTLES DRAWN AEROBIC AND ANAEROBIC Blood Culture adequate volume   Culture   Final    NO GROWTH < 24 HOURS Performed at [facility], [address], [place], [place] [address]    Report Status PENDING  Incomplete  Culture, blood (Routine X 2) w Reflex to ID Panel     Status: None (Preliminary result)   Collection Time: 06/16/23  7:01 PM   Specimen: BLOOD  Result Value Ref Range Status   Specimen Description BLOOD LEFT ANTECUBITAL  Final   Special Requests   Final    BOTTLES DRAWN AEROBIC ONLY Blood  Culture results may not be optimal due to an excessive volume of blood received in culture bottles   Culture   Final    NO GROWTH < 12 HOURS Performed at [facility], [address], [place], [place] [address]    Report Status PENDING  Incomplete     Scheduled Meds:  sodium chloride   Intravenous Once   acyclovir  400 mg Oral BID   calcium carbonate  1 tablet Oral TID WC   Chlorhexidine Gluconate Cloth  6 each Topical Daily   ferrous sulfate  325 mg Oral Daily   levothyroxine  68.5 mcg Oral Q0600   midodrine  7.5 mg Oral TID WC   multivitamin with minerals  1 tablet Oral q morning   omega-3 acid ethyl esters  1 capsule Oral Daily   oxybutynin  10 mg Oral QHS   pantoprazole  80 mg Oral Daily   polyethylene glycol  17 g Oral Daily   potassium chloride SA  20 mEq Oral Daily   Continuous Infusions:  sodium chloride     calcium gluconate     cefTRIAXone (ROCEPHIN)  IV      Procedures/Studies: IR IMAGING GUIDED PORT INSERTION  Result Date:  05/23/2023 INDICATION: 82 year old female with multiple myeloma not having achieved remission. She has very poor venous access and presents for placement of a port catheter. EXAM: IMPLANTED PORT A CATH PLACEMENT WITH ULTRASOUND AND FLUOROSCOPIC GUIDANCE MEDICATIONS: None. ANESTHESIA/SEDATION: Versed 1 mg IV; Fentanyl 100 mcg IV; administered by the radiology nurse Moderate Sedation Time:  16 minutes The patient's vital signs and level of consciousness were continuously monitored during the procedure by the interventional radiology nurse under my direct supervision. FLUOROSCOPY: Radiation exposure index: 1 mg reference air kerma COMPLICATIONS: None immediate. PROCEDURE: The right neck and chest was prepped with chlorhexidine, and draped in the usual sterile fashion using maximum barrier technique (cap and mask, sterile gown, sterile gloves, large sterile sheet, hand hygiene and cutaneous antiseptic). Local anesthesia was attained by infiltration with 1% lidocaine with epinephrine. Ultrasound demonstrated patency of the right internal jugular vein, and this was documented with an image. Under real-time ultrasound guidance, this vein was accessed with a 21 gauge micropuncture needle and image documentation was performed. A small dermatotomy was made at the access site with an 11 scalpel. A 0.018" wire was advanced into the SVC and the access needle exchanged for a 87F micropuncture vascular sheath. The 0.018" wire was then removed and a 0.035" wire advanced into the IVC. An appropriate location for the subcutaneous reservoir was selected below the clavicle and an incision was made through the skin and underlying soft tissues. The subcutaneous tissues were then dissected using a combination of blunt and sharp surgical technique and a pocket was formed. A single lumen power injectable portacatheter [facility]) was then tunneled through the subcutaneous tissues from the pocket to the dermatotomy and the  port reservoir placed within the subcutaneous pocket. The venous access site was then serially dilated and a peel away vascular sheath placed over the wire. The wire was removed and the port catheter advanced into position under fluoroscopic guidance. The catheter tip is positioned in the upper right atrium. This was documented with a spot image. The portacatheter was then tested and found to flush and aspirate well. The port was flushed with saline followed by 100 units/mL heparinized saline. The pocket was then closed in two layers using first subdermal inverted interrupted absorbable sutures followed by a running  subcuticular suture. The epidermis was then sealed with Dermabond. The dermatotomy at the venous access site was also closed with Dermabond. IMPRESSION: Successful placement of a right IJ approach Power Port with ultrasound and fluoroscopic guidance. The catheter is ready for use. Electronically Signed   By: Malachy Moan M.D.   On: 05/23/2023 16:24    Catarina Hartshorn, DO  [facility]  If 7PM-7AM, please contact night-coverage [email or web address] Password [email or web address] 06/17/2023, 7:49 AM   LOS: 1 day

## 2023-06-17 NOTE — Progress Notes (Addendum)
UNMATCHED BLOOD PRODUCT NOTE  Compare the patient ID on the blood tag to the patient ID on the hospital armband and Blood Bank armband. Then confirm the unit number on the blood tag matches the unit number on the blood product.  If a discrepancy is discovered return the product to blood bank immediately.   Blood Product Type: Packed Red Blood Cells  Unit #: (Found on blood product bag, begins with W) [id number]  Product Code #: (Found on blood product bag, begins with E) [id number]   Start Time: 1540  Starting Rate: 120 ml/hr  Rate increase/decreased  (if applicable):      ml/hr  Rate changed time (if applicable):    Stop Time: 1840   All Other Documentation should be documented within the Blood Admin Flowsheet per policy.  Second verified with Atilano Ina, RN

## 2023-06-17 NOTE — Progress Notes (Addendum)
Progress note  RN called due to patient having a low BP at 81/47 with pulse in the 120s and asymptomatic, this was rechecked manually and it was 96/62.  IV NS 500 mL bolus was given and a BP improved to 102/65.  Regarding the tachycardia, EKG was done and personally reviewed showed normal sinus rhythm at a rate of 85 bpm with RBBB (which was not different from EKG on admission except that QT prolongation has resolved). CMP and CBC were checked     Latest Ref Rng & Units 06/17/2023    1:18 AM 06/16/2023    8:46 AM 06/08/2023    1:02 PM  CBC  WBC 4.0 - 10.5 K/uL 3.9  6.5  14.1   Hemoglobin 12.0 - 15.0 g/dL 6.0  7.9  8.2   Hematocrit 36.0 - 46.0 % 17.0  21.6  24.2   Platelets 150 - 400 K/uL 62  75  164       Latest Ref Rng & Units 06/16/2023    8:45 PM 06/16/2023    8:46 AM 06/08/2023    1:02 PM  CMP  Glucose 70 - 99 mg/dL 696  295  99   BUN 8 - 23 mg/dL 54  61  18   Creatinine 0.44 - 1.00 mg/dL 2.84  1.32  4.40   Sodium 135 - 145 mmol/L 140  137  132   Potassium 3.5 - 5.1 mmol/L 3.3  3.3  3.7   Chloride 98 - 111 mmol/L 110  102  101   CO2 22 - 32 mmol/L 19  21  21    Calcium 8.9 - 10.3 mg/dL 5.6  5.8  6.7   Total Protein 6.5 - 8.1 g/dL 5.6  7.1  7.9   Total Bilirubin <1.2 mg/dL 1.0  1.3  0.6   Alkaline Phos 38 - 126 U/L 136  177  185   AST 15 - 41 U/L 15  14  16    ALT 0 - 44 U/L 15  15  11     BP 99/61   Pulse (!) 114   Temp 98.6 F (37 C)   Resp 20   Ht 5\' 1"  (1.549 m)   Wt 59.4 kg   SpO2 100%   BMI 24.75 kg/m    At bedside, patient was in no acute distress.  Pale conjunctiva.  Port-A-Cath placement on chest noted.   Assessment and plan  Chronic blood loss anemia Hemoglobin dropped to 6.0 from 7.9 (11/14), there was no sign of bleeding ?  Dilutional effect, MM Patient was asymptomatic Repeated H/H 7.0/20.5 Type and screen will be done H/H will be repeated in the morning with plan to transfuse for hemoglobin < 7.0  Anemia of chronic disease Patient appears to  have anemia of inflammation based on prior iron studies (TIBC was not calculated on recent study). This may be related to patient's history of multiple myeloma Continue management as described above  Hypokalemia K+ 3.3, this was replenished   Note: Please refer to admission H&P for details regarding the care of this patient   Total time:  53 minutes This includes time reviewing the chart including progress notes, labs, EKGs, taking medical decisions, ordering labs and documenting findings.

## 2023-06-17 NOTE — Progress Notes (Signed)
Received critical value of calcium 5.6  notified md and received order.

## 2023-06-17 NOTE — Telephone Encounter (Signed)
Noted  

## 2023-06-17 NOTE — Progress Notes (Signed)
Discussed patient with Dr. Arbutus Leas. She has had complaint of duyspahgia for the last 3 months with solids and liquids and since she has not mentioned this to anyone else she thought she would while here in the hospital. She is tolerating p.o and although having weight loss likely from other etiologies this is not an indication for inpatient EGD.  She will need to follow up with primary GI (Dr. Leone Payor) to discuss workup of dysphagia outpatient. Our office will reach out to [facility] for them to contact patient for an appointment.   Brooke Bonito, MSN, APRN, FNP-BC, AGACNP-BC [facility] at [facility]

## 2023-06-17 NOTE — Telephone Encounter (Signed)
Contacted by hospitalist with patient reports of dysphagia for 3 months. She is a [facility] Leone Payor) patient.   Please coordinate with [facility] to get patient an appointment with them to discuss her dysphagia within the next 2 weeks. Please call [facility] or contact their front desk staff to reach out to patient.   Brooke Bonito, MSN, APRN, FNP-BC, AGACNP-BC [facility] at [facility]

## 2023-06-17 NOTE — Progress Notes (Signed)
Received critcal result of hemoglobin of 6.  Notified MD and received order for redraw to confirm result of lab value.

## 2023-06-17 NOTE — Progress Notes (Signed)
Upon start of shift,vitals were done on patient and patient had a low blood pressure, which made her a yellow MEWS.  Notified MD and after bolus, bp was rechecked and pressure was 102 systolic.   Lab work was drawn because telemetry was calling reports periods of sinus tachycardia on patient.  Patient would have pulse in the 80s, then heart rate would increase into the 120s for approximately an hour and then drop back down.  Calcium came back at a critical of 5.6 and potassium of 3.3  Notified MD and received an order for potassium supplement and given per order.  EKG done on patient and shows a sinus rhythm with a bundle branch block.  MD notified of completion of EKG.  Lab work drawn again per MD order and hemoglobin came back as a critical of 6.0.  Received order for redraw to confirm accuracy of result.  At this time vitals have been stable.  At this time, patient does state she does have times swallowing and feels as if food does get stuck in her throat at times. Urine output at this time has been 200 cc with one episode of urine occurrence.

## 2023-06-17 NOTE — Plan of Care (Signed)
  Problem: Acute Rehab PT Goals(only PT should resolve) Goal: Pt Will Go Supine/Side To Sit Outcome: Progressing Flowsheets (Taken 06/17/2023 1343) Pt will go Supine/Side to Sit:  with minimal assist  with moderate assist Goal: Patient Will Transfer Sit To/From Stand Outcome: Progressing Flowsheets (Taken 06/17/2023 1343) Patient will transfer sit to/from stand: with minimal assist Goal: Pt Will Transfer Bed To Chair/Chair To Bed Outcome: Progressing Flowsheets (Taken 06/17/2023 1343) Pt will Transfer Bed to Chair/Chair to Bed: with mod assist Goal: Pt Will Ambulate Outcome: Progressing Flowsheets (Taken 06/17/2023 1343) Pt will Ambulate:  25 feet  with minimal assist  with rolling walker   1:44 PM, 06/17/23 [name], MPT Physical Therapist with [facility] [phone] office [phone] mobile phone

## 2023-06-17 NOTE — Progress Notes (Addendum)
Received order to recheck  hemogobin at 0900 and type and screen to prepare blood. MD to floor to reassess patient.

## 2023-06-17 NOTE — Plan of Care (Signed)

## 2023-06-17 NOTE — Progress Notes (Signed)
MEWS Progress Note  Patient Details Name: Liylah Bouwkamp Revoir MRN: [id number] DOB: 05/09/1941 Today's Date: 06/17/2023   MEWS Flowsheet Documentation:  Assess: MEWS Score Temp: 97.6 F (36.4 C) BP: 99/65 MAP (mmHg): 75 Pulse Rate: 93 ECG Heart Rate: 95 Resp: 20 Level of Consciousness: Alert SpO2: 100 % O2 Device: Room Air Assess: MEWS Score MEWS Temp: 0 MEWS Systolic: 1 MEWS Pulse: 0 MEWS RR: 0 MEWS LOC: 0 MEWS Score: 1 MEWS Score Color: Green Assess: SIRS CRITERIA SIRS Temperature : 0 SIRS Respirations : 0 SIRS Pulse: 1 SIRS WBC: 0 SIRS Score Sum : 1 SIRS Temperature : 0 SIRS Pulse: 1 SIRS Respirations : 0 SIRS WBC: 0 SIRS Score Sum : 1 Assess: if the MEWS score is Yellow or Red Were vital signs accurate and taken at a resting state?: Yes Does the patient meet 2 or more of the SIRS criteria?: No MEWS guidelines implemented : No, previously yellow, continue vital signs every 4 hours Notify: Charge Nurse/RN Name of Charge Nurse/RN Notified: Charity fundraiser  Previous RN left at [employer].     Karolee Ohs 06/17/2023, 4:13 AM

## 2023-06-17 NOTE — Evaluation (Signed)
Physical Therapy Evaluation Patient Details Name: Kathy [name] MRN: [id number] DOB: 11-30-1940 Today's Date: 06/17/2023  History of Present Illness  Kathy [name] is a 82 year old female with history of myeloma, hypertension, GERD, hypothyroidism, OSA presenting with generalized weakness for the last 3 to 4 days prior to admission.  The patient normally resides at [facility] in [place].  The patient presented to the [facility] for her usual daratumumab injection on 06/16/2023.  Routine blood work was obtained and showed the patient to have acute kidney injury, hypomagnesemia, and hypocalcemia.  The patient was given 2 g calcium and transferred to the emergency department for further evaluation and treatment.  The patient denies any fevers, chills, headache, chest pain, shortness of breath, coughing, hemoptysis, nausea, vomiting, diarrhea, abdominal pain.  There is no hematochezia or melena.  She states that her oral intake has been poor.  At baseline, the patient ambulates with a walker.  She denies any other new medications.  Her last daratumumab injection was on 06/01/2023.  In the ED, the patient was afebrile and tachycardic in the 110s.  Blood pressure was initially soft with SBP in the 90s.  The patient was given 1 L normal saline and 2 g magnesium.  BMP showed sodium 137, potassium 3.3, bicarbonate 21, serum creatinine 2.38.  LFTs were unremarkable.  Magnesium 1.5.  Corrected calcium 7.8.  WBC 6.5, hemoglobin 7.9, platelets 75,000.  EKG shows sinus rhythm at a rate bundle branch block.  Notably, the patient was recently mated to the hospital from 05/11/2023 to 05/18/2023.  During that hospital admission, the patient was treated for E. coli UTI which was felt to be contributing to the patient's acute metabolic encephalopathy.  She was discharged with cephalexin.  The patient was also transfused 1 unit PRBC.  She was noted to have acute kidney injury which improved with IV  fluids.   Clinical Impression  Patient demonstrates slow labored movement for sitting up at bedside with most difficulty moving LLE, had to raise bed for completing sit to stands due to unable to flex left knee, able to take a few steps forward/backward at bedside before having to sit due to c/o fatigue.  Patient tolerated sitting up in chair after therapy.  Patient will benefit from continued skilled physical therapy in hospital and recommended venue below to increase strength, balance, endurance for safe ADLs and gait.           If plan is discharge home, recommend the following: A lot of help with walking and/or transfers;A lot of help with bathing/dressing/bathroom;Help with stairs or ramp for entrance;Assistance with cooking/housework   Can travel by private vehicle   No    Equipment Recommendations None recommended by PT  Recommendations for Other Services       Functional Status Assessment Patient has had a recent decline in their functional status and demonstrates the ability to make significant improvements in function in a reasonable and predictable amount of time.     Precautions / Restrictions Precautions Precautions: Fall Restrictions Weight Bearing Restrictions: No      Mobility  Bed Mobility Overal bed mobility: Needs Assistance Bed Mobility: Supine to Sit     Supine to sit: Mod assist, Max assist     General bed mobility comments: increased time, labored movement    Transfers Overall transfer level: Needs assistance Equipment used: Rolling walker (2 wheels) Transfers: Sit to/from Stand, Bed to chair/wheelchair/BSC Sit to Stand: Mod assist   Step pivot transfers: Min  assist       General transfer comment: has difficulty with sit to stands due to unable to flex left knee    Ambulation/Gait Ambulation/Gait assistance: Min assist, Mod assist Gait Distance (Feet): 8 Feet Assistive device: Rolling walker (2 wheels) Gait Pattern/deviations:  Decreased step length - right, Decreased step length - left, Decreased stride length Gait velocity: slow     General Gait Details: limited to a few steps forward/backward before having to sit due to fatigue  Stairs            Wheelchair Mobility     Tilt Bed    Modified Rankin (Stroke Patients Only)       Balance Overall balance assessment: Needs assistance Sitting-balance support: Feet supported, Bilateral upper extremity supported Sitting balance-Leahy Scale: Poor Sitting balance - Comments: seated at bedside, unable to put left foot on floor due to quadriceps contractures Postural control: Posterior lean Standing balance support: Reliant on assistive device for balance, During functional activity, Bilateral upper extremity supported Standing balance-Leahy Scale: Fair Standing balance comment: using RW                             Pertinent Vitals/Pain Pain Assessment Pain Assessment: No/denies pain    Home Living Family/patient expects to be discharged to:: Skilled nursing facility                   Additional Comments: Pt reports living at [facility]    Prior Function Prior Level of Function : Needs assist       Physical Assist : Mobility (physical);ADLs (physical) Mobility (physical): Bed mobility;Transfers;Gait ADLs (physical): Bathing;Dressing;IADLs Mobility Comments: Short distanced household ambulator using RW with assistance ADLs Comments: Pt reports staff assist her from bed level with self care tasks but she is able to self feed at baseline     Extremity/Trunk Assessment   Upper Extremity Assessment Upper Extremity Assessment: Generalized weakness    Lower Extremity Assessment Lower Extremity Assessment: Generalized weakness;LLE deficits/detail LLE Deficits / Details: grossly -4/5, presents with left knee flexion contracture, unable to flex knee LLE: Unable to fully assess due to immobilization LLE Sensation: WNL LLE  Coordination: WNL    Cervical / Trunk Assessment Cervical / Trunk Assessment: Normal  Communication   Communication Communication: No apparent difficulties  Cognition Arousal: Alert Behavior During Therapy: WFL for tasks assessed/performed Overall Cognitive Status: Within Functional Limits for tasks assessed                                          General Comments      Exercises     Assessment/Plan    PT Assessment Patient needs continued PT services  PT Problem List Decreased strength;Decreased activity tolerance;Decreased balance;Decreased mobility       PT Treatment Interventions DME instruction;Gait training;Functional mobility training;Therapeutic activities;Therapeutic exercise;Balance training;Patient/family education    PT Goals (Current goals can be found in the Care Plan section)  Acute Rehab PT Goals Patient Stated Goal: return home with SNF staff to assist PT Goal Formulation: With patient Time For Goal Achievement: 07/01/23 Potential to Achieve Goals: Good    Frequency Min 3X/week     Co-evaluation               AM-PAC PT "6 Clicks" Mobility  Outcome Measure Help needed turning from your back to  your side while in a flat bed without using bedrails?: A Lot Help needed moving from lying on your back to sitting on the side of a flat bed without using bedrails?: A Lot Help needed moving to and from a bed to a chair (including a wheelchair)?: A Little Help needed standing up from a chair using your arms (e.g., wheelchair or bedside chair)?: A Lot Help needed to walk in hospital room?: A Little Help needed climbing 3-5 steps with a railing? : A Lot 6 Click Score: 14    End of Session   Activity Tolerance: Patient tolerated treatment well;Patient limited by fatigue Patient left: in chair;with call bell/phone within reach Nurse Communication: Mobility status PT Visit Diagnosis: Unsteadiness on feet (R26.81);Other abnormalities of  gait and mobility (R26.89);Muscle weakness (generalized) (M62.81)    Time: 1610-9604 PT Time Calculation (min) (ACUTE ONLY): 30 min   Charges:   PT Evaluation $PT Eval Moderate Complexity: 1 Mod PT Treatments $Therapeutic Activity: 23-37 mins PT General Charges $$ ACUTE PT VISIT: 1 Visit         1:42 PM, 06/17/23 [name], MPT Physical Therapist with [facility] [phone] office [phone] mobile phone

## 2023-06-17 NOTE — Progress Notes (Addendum)
PHARMACY NOTE:  ANTIMICROBIAL RENAL DOSAGE ADJUSTMENT  Current antimicrobial regimen includes a mismatch between antimicrobial dosage and estimated renal function.  As per policy approved by the Pharmacy & Therapeutics and Medical Executive Committees, the antimicrobial dosage will be adjusted accordingly.  Current antimicrobial dosage:  Zosyn 3.375 g q8 hours  Indication: GNR bacteremia  Renal Function: 17.2 mL/min  Estimated Creatinine Clearance: 17.2 mL/min (A) (by C-G formula based on SCr of 2.12 mg/dL (H)). []      On intermittent HD, scheduled: []      On CRRT    Antimicrobial dosage has been changed to:  zosyn 2.25g IV q8 hours  Additional comments: Will follow along for further dose adjustments  Thank you for allowing pharmacy to be a part of this patient's care.  Sheppard Coil PharmD., BCPS Clinical Pharmacist 06/17/2023 9:55 AM

## 2023-06-17 NOTE — Progress Notes (Signed)
Has not had bm for several days and has not noticed any bright blood in stools.  Dark stools and on iron.

## 2023-06-18 DIAGNOSIS — D696 Thrombocytopenia, unspecified: Secondary | ICD-10-CM | POA: Diagnosis not present

## 2023-06-18 DIAGNOSIS — R7881 Bacteremia: Secondary | ICD-10-CM | POA: Diagnosis not present

## 2023-06-18 DIAGNOSIS — N179 Acute kidney failure, unspecified: Secondary | ICD-10-CM | POA: Diagnosis not present

## 2023-06-18 LAB — CBC
HCT: 23.5 % — ABNORMAL LOW (ref 36.0–46.0)
Hemoglobin: 7.7 g/dL — ABNORMAL LOW (ref 12.0–15.0)
MCH: 32 pg (ref 26.0–34.0)
MCHC: 32.8 g/dL (ref 30.0–36.0)
MCV: 97.5 fL (ref 80.0–100.0)
Platelets: 75 10*3/uL — ABNORMAL LOW (ref 150–400)
RBC: 2.41 MIL/uL — ABNORMAL LOW (ref 3.87–5.11)
RDW: 17.3 % — ABNORMAL HIGH (ref 11.5–15.5)
WBC: 3.9 10*3/uL — ABNORMAL LOW (ref 4.0–10.5)
nRBC: 0 % (ref 0.0–0.2)

## 2023-06-18 LAB — BASIC METABOLIC PANEL
Anion gap: 11 (ref 5–15)
BUN: 44 mg/dL — ABNORMAL HIGH (ref 8–23)
CO2: 18 mmol/L — ABNORMAL LOW (ref 22–32)
Calcium: 6.4 mg/dL — CL (ref 8.9–10.3)
Chloride: 116 mmol/L — ABNORMAL HIGH (ref 98–111)
Creatinine, Ser: 1.81 mg/dL — ABNORMAL HIGH (ref 0.44–1.00)
GFR, Estimated: 28 mL/min — ABNORMAL LOW (ref >60)
Glucose, Bld: 93 mg/dL (ref 70–99)
Potassium: 4.4 mmol/L (ref 3.5–5.1)
Sodium: 145 mmol/L (ref 135–145)

## 2023-06-18 LAB — OCCULT BLOOD X 1 CARD TO LAB, STOOL: Fecal Occult Bld: NEGATIVE

## 2023-06-18 LAB — MAGNESIUM: Magnesium: 1.9 mg/dL (ref 1.7–2.4)

## 2023-06-18 MED ORDER — CALCIUM GLUCONATE-NACL 2-0.675 GM/100ML-% IV SOLN
2.0000 g | Freq: Once | INTRAVENOUS | Status: DC
  Filled 2023-06-18: qty 100

## 2023-06-18 MED ORDER — CALCIUM GLUCONATE-NACL 1-0.675 GM/50ML-% IV SOLN
1.0000 g | INTRAVENOUS | Status: AC
  Administered 2023-06-18 (×2): 1000 mg via INTRAVENOUS
  Filled 2023-06-18 (×2): qty 50

## 2023-06-18 MED ORDER — SODIUM CHLORIDE 0.45 % IV SOLN: INTRAVENOUS | Status: AC

## 2023-06-18 NOTE — Progress Notes (Signed)
PROGRESS NOTE  Kathy [name] KZS:[id number] DOB: January 25, 1941 DOA: 06/16/2023 PCP: [name], MD  Brief History:  82 year old female with history of myeloma, hypertension, GERD, hypothyroidism, OSA presenting with generalized weakness for the last 3 to 4 days prior to admission.  The patient normally resides at [facility] in [place].  The patient presented to the [facility] cancer center for her usual daratumumab injection on 06/16/2023.  Routine blood work was obtained and showed the patient to have acute kidney injury, hypomagnesemia, and hypocalcemia.  The patient was given 2 g calcium and transferred to the emergency department for further evaluation and treatment.  The patient denies any fevers, chills, headache, chest pain, shortness of breath, coughing, hemoptysis, nausea, vomiting, diarrhea, abdominal pain.  There is no hematochezia or melena.  She states that her oral intake has been poor.  At baseline, the patient ambulates with a walker.  She denies any other new medications.  Her last daratumumab injection was on 06/01/2023. In the ED, the patient was afebrile and tachycardic in the 110s.  Blood pressure was initially soft with SBP in the 90s.  The patient was given 1 L normal saline and 2 g magnesium.  BMP showed sodium 137, potassium 3.3, bicarbonate 21, serum creatinine 2.38.  LFTs were unremarkable.  Magnesium 1.5.  Corrected calcium 7.8.  WBC 6.5, hemoglobin 7.9, platelets 75,000.  EKG shows sinus rhythm at a rate bundle branch block. Notably, the patient was recently mated to the hospital from 05/11/2023 to 05/18/2023.  During that hospital admission, the patient was treated for E. coli UTI which was felt to be contributing to the patient's acute metabolic encephalopathy.  She was discharged with cephalexin.  The patient was also transfused 1 unit PRBC.  She was noted to have acute kidney injury which improved with IV fluids.   Assessment/Plan: Acute kidney  injury -Secondary to volume depletion -Baseline creatinine 0.7-1.0 -Presented with serum creatinine 2.38 -Continue IV fluids>>slowly improving -UA 21-50 WBC   Gram Negative bacteremia -source is urine -Initially on zosyn 2 grams daily>>now on ceftriaxone pending final culture data  UTI--GNR -UA 21-50 WBC -continue ceftriaxone pending final culture data  Hypocalcemia -Corrected calcium 7.7>>8.4 -Repeat albumin and calcium 06/17/2023 -give additional 2 g calcium gluconate -continue TUMS TID   Dysphagia/Odynophagia -solids and liquids x 6 months -GI consulted>>stable for outpt work up as pt is tolerating diet presently   Anemia of chronic disease -baseline Hgb ~8 -drop in Hgb partly dilutional and from blood draws -transfused one unit PRBC 11/15   Hypomagnesemia -Replete   Hypokalemia>>hyperkalemia -Replaced initially -now remove KCl from IVF   Myeloma -follow with Dr. 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  Denies f/c, cp, sob, abd pain, n/v/d, abd pain  Objective: Vitals:   06/17/23 1614 06/17/23 1845 06/17/23 2019 06/18/23 0342  BP: Marland Kitchen)  97/52 97/69 (!) 91/57 96/60  Pulse: 70 86 63 77  Resp: 16 16 20 18   Temp: 99 F (37.2 C) 98.5 F (36.9 C) 98.1 F (36.7 C) 98 F (36.7 C)  TempSrc: Oral Oral Oral Oral  SpO2: 100% 100% 99% 100%  Weight:      Height:        Intake/Output Summary (Last 24 hours) at 06/18/2023 1115 Last data filed at  06/18/2023 1050 Gross per 24 hour  Intake 1210 ml  Output 1250 ml  Net -40 ml   Weight change:  Exam:  General:  Pt is alert, follows commands appropriately, not in acute distress HEENT: No icterus, No thrush, No neck mass, [place]/AT Cardiovascular: RRR, S1/S2, no rubs, no gallops Respiratory: fine bibasilar crackles. No wheeze Abdomen: Soft/+BS, non tender, non distended, no guarding Extremities: No edema, No lymphangitis, No petechiae, No rashes, no synovitis   Data Reviewed: I have personally reviewed following labs and imaging studies Basic Metabolic Panel: Recent Labs  Lab 06/16/23 0846 06/16/23 2045 06/17/23 0242 06/18/23 0453  NA 137 140 140 145  K 3.3* 3.3* 5.3* 4.4  CL 102 110 112* 116*  CO2 21* 19* 16* 18*  GLUCOSE 101* 113* 94 93  BUN 61* 54* 50* 44*  CREATININE 2.38* 2.21* 2.12* 1.81*  CALCIUM 5.8* 5.6* 5.7* 6.4*  MG 1.5*  --  2.1 1.9   Liver Function Tests: Recent Labs  Lab 06/16/23 0846 06/16/23 2045 06/17/23 0242  AST 14* 15 23  ALT 15 15 15   ALKPHOS 177* 136* 159*  BILITOT 1.3* 1.0 1.2*  PROT 7.1 5.6* 6.4*  ALBUMIN 1.5* <1.5* <1.5*   No results for input(s): "LIPASE", "AMYLASE" in the last 168 hours. No results for input(s): "AMMONIA" in the last 168 hours. Coagulation Profile: No results for input(s): "INR", "PROTIME" in the last 168 hours. CBC: Recent Labs  Lab 06/16/23 0846 06/17/23 0118 06/17/23 0242 06/18/23 0453  WBC 6.5 3.9* 4.5 3.9*  NEUTROABS 4.8  --   --   --   HGB 7.9* 6.0* 7.0* 7.7*  HCT 21.6* 17.0* 20.5* 23.5*  MCV 92.3 95.0 98.1 97.5  PLT 75* 62* 64* 75*   Cardiac Enzymes: No results for input(s): "CKTOTAL", "CKMB", "CKMBINDEX", "TROPONINI" in the last 168 hours. BNP: Invalid input(s): "POCBNP" CBG: No results for input(s): "GLUCAP" in the last 168 hours. HbA1C: No results for input(s): "HGBA1C" in the last 72 hours. Urine analysis:    Component Value Date/Time   COLORURINE YELLOW 06/16/2023 1615   APPEARANCEUR  HAZY (A) 06/16/2023 1615   LABSPEC 1.008 06/16/2023 1615   PHURINE 6.0 06/16/2023 1615   GLUCOSEU NEGATIVE 06/16/2023 1615   HGBUR MODERATE (A) 06/16/2023 1615   BILIRUBINUR NEGATIVE 06/16/2023 1615   KETONESUR NEGATIVE 06/16/2023 1615   PROTEINUR 30 (A) 06/16/2023 1615   UROBILINOGEN 0.2 09/26/2013 1042   NITRITE NEGATIVE 06/16/2023 1615   LEUKOCYTESUR LARGE (A) 06/16/2023 1615   Sepsis Labs: @LABRCNTIP (procalcitonin:4,lacticidven:4) ) Recent Results (from the past 240 hour(s))  Urine Culture     Status: Abnormal (Preliminary result)   Collection Time: 06/16/23  4:15 PM   Specimen: Urine, Random  Result Value Ref Range Status   Specimen Description   Final    URINE, RANDOM Performed at [facility], [address] [place], [place] [address]    Special Requests   Final    NONE Reflexed from [id number] Performed at [facility], [address], [place], [place] [address]    Culture (A)  Final    >=  100,000 COLONIES/mL GRAM NEGATIVE RODS SUSCEPTIBILITIES TO FOLLOW Performed at [facility], [address] [place], [place] [address]    Report Status PENDING  Incomplete  Culture, blood (Routine X 2) w Reflex to ID Panel     Status: None (Preliminary result)   Collection Time: 06/16/23  4:39 PM   Specimen: Porta Cath; Blood  Result Value Ref Range Status   Specimen Description   Final    PORTA CATH Performed at [facility], [address], [place], [place] [address]    Special Requests   Final    BOTTLES DRAWN AEROBIC AND ANAEROBIC Blood Culture adequate volume Performed at [facility], [address] [place], [place] [address]    Culture  Setup Time   Final    GRAM NEGATIVE RODS IN BOTH AEROBIC AND ANAEROBIC BOTTLES Gram Stain Report Called to,Read Back By and Verified With: RENE T @ 0809 ON [id number] BY HENDERSON L Performed at [facility], [address], [place], [place] [address]    Culture   Final    Romie Minus NEGATIVE RODS IDENTIFICATION TO FOLLOW Performed at [facility], [address], [place], [place] [address]    Report Status PENDING  Incomplete  Culture, blood (Routine X 2) w Reflex to ID Panel     Status: Abnormal (Preliminary result)   Collection Time: 06/16/23  7:01 PM   Specimen: BLOOD  Result Value Ref Range Status   Specimen Description   Final    BLOOD LEFT ANTECUBITAL Performed at [facility], [address], [place], [place] [address]    Special Requests   Final    BOTTLES DRAWN AEROBIC ONLY Blood Culture results may not be optimal due to an excessive volume of blood received in culture bottles Performed at [facility], [address], [place], [place] [address]    Culture  Setup Time   Final    GRAM NEGATIVE RODS AEROBIC BOTTLE ONLY Gram Stain Report Called to,Read Back By and Verified With: RENE T @ 0920 ON [id number] BY HENDERSO L CRITICAL RESULT CALLED TO, READ BACK BY AND VERIFIED WITH: PHARMD S HURTH [id number] AT 1308 BY CM    Culture (A)  Final    ESCHERICHIA COLI SUSCEPTIBILITIES TO FOLLOW Performed at [facility], [address], [place], [place] [address]    Report Status PENDING  Incomplete  Blood Culture ID Panel (Reflexed)     Status: Abnormal   Collection Time: 06/16/23  7:01 PM  Result Value Ref Range Status   Enterococcus faecalis NOT DETECTED NOT DETECTED Final   Enterococcus Faecium NOT DETECTED NOT DETECTED Final   Listeria monocytogenes NOT DETECTED NOT DETECTED Final   Staphylococcus species NOT DETECTED NOT DETECTED Final   Staphylococcus aureus (BCID) NOT DETECTED NOT DETECTED Final   Staphylococcus epidermidis NOT DETECTED NOT DETECTED Final   Staphylococcus lugdunensis NOT DETECTED NOT DETECTED Final   Streptococcus species NOT DETECTED NOT DETECTED Final   Streptococcus agalactiae NOT DETECTED NOT DETECTED Final   Streptococcus pneumoniae NOT DETECTED NOT DETECTED Final   Streptococcus pyogenes NOT DETECTED NOT DETECTED Final   A.calcoaceticus-baumannii NOT DETECTED NOT DETECTED Final    Bacteroides fragilis NOT DETECTED NOT DETECTED Final   Enterobacterales DETECTED (A) NOT DETECTED Final    Comment: Enterobacterales represent a large order of gram negative bacteria, not a single organism. CRITICAL RESULT CALLED TO, READ BACK BY AND VERIFIED WITH: PHARMD S HURTH [id number] AT 1308 BY CM    Enterobacter  cloacae complex NOT DETECTED NOT DETECTED Final   Escherichia coli DETECTED (A) NOT DETECTED Final    Comment: CRITICAL RESULT CALLED TO, READ BACK BY AND VERIFIED WITH: PHARMD S HURTH [id number] AT 1308 BY CM    Klebsiella aerogenes NOT DETECTED NOT DETECTED Final   Klebsiella oxytoca NOT DETECTED NOT DETECTED Final   Klebsiella pneumoniae NOT DETECTED NOT DETECTED Final   Proteus species NOT DETECTED NOT DETECTED Final   Salmonella species NOT DETECTED NOT DETECTED Final   Serratia marcescens NOT DETECTED NOT DETECTED Final   Haemophilus influenzae NOT DETECTED NOT DETECTED Final   Neisseria meningitidis NOT DETECTED NOT DETECTED Final   Pseudomonas aeruginosa NOT DETECTED NOT DETECTED Final   Stenotrophomonas maltophilia NOT DETECTED NOT DETECTED Final   Candida albicans NOT DETECTED NOT DETECTED Final   Candida auris NOT DETECTED NOT DETECTED Final   Candida glabrata NOT DETECTED NOT DETECTED Final   Candida krusei NOT DETECTED NOT DETECTED Final   Candida parapsilosis NOT DETECTED NOT DETECTED Final   Candida tropicalis NOT DETECTED NOT DETECTED Final   Cryptococcus neoformans/gattii NOT DETECTED NOT DETECTED Final   CTX-M ESBL NOT DETECTED NOT DETECTED Final   Carbapenem resistance IMP NOT DETECTED NOT DETECTED Final   Carbapenem resistance KPC NOT DETECTED NOT DETECTED Final   Carbapenem resistance NDM NOT DETECTED NOT DETECTED Final   Carbapenem resist OXA 48 LIKE NOT DETECTED NOT DETECTED Final   Carbapenem resistance VIM NOT DETECTED NOT DETECTED Final    Comment: Performed at [facility], [address], [place], [place] [address]     Scheduled Meds:   acyclovir  400 mg Oral BID   calcium carbonate  1 tablet Oral TID WC   Chlorhexidine Gluconate Cloth  6 each Topical Daily   ferrous sulfate  325 mg Oral Daily   levothyroxine  68.5 mcg Oral Q0600   midodrine  7.5 mg Oral TID WC   multivitamin with minerals  1 tablet Oral q morning   omega-3 acid ethyl esters  1 capsule Oral Daily   oxybutynin  10 mg Oral QHS   pantoprazole  80 mg Oral Daily   polyethylene glycol  17 g Oral Daily   Continuous Infusions:  cefTRIAXone (ROCEPHIN)  IV 2 g (06/17/23 2106)    Procedures/Studies: IR IMAGING GUIDED PORT INSERTION  Result Date: 05/23/2023 INDICATION: 82 year old female with multiple myeloma not having achieved remission. She has very poor venous access and presents for placement of a port catheter. EXAM: IMPLANTED PORT A CATH PLACEMENT WITH ULTRASOUND AND FLUOROSCOPIC GUIDANCE MEDICATIONS: None. ANESTHESIA/SEDATION: Versed 1 mg IV; Fentanyl 100 mcg IV; administered by the radiology nurse Moderate Sedation Time:  16 minutes The patient's vital signs and level of consciousness were continuously monitored during the procedure by the interventional radiology nurse under my direct supervision. FLUOROSCOPY: Radiation exposure index: 1 mg reference air kerma COMPLICATIONS: None immediate. PROCEDURE: The right neck and chest was prepped with chlorhexidine, and draped in the usual sterile fashion using maximum barrier technique (cap and mask, sterile gown, sterile gloves, large sterile sheet, hand hygiene and cutaneous antiseptic). Local anesthesia was attained by infiltration with 1% lidocaine with epinephrine. Ultrasound demonstrated patency of the right internal jugular vein, and this was documented with an image. Under real-time ultrasound guidance, this vein was accessed with a 21 gauge micropuncture needle and image documentation was performed. A small dermatotomy was made at the access site with an 11 scalpel. A 0.018" wire was advanced into the SVC and the  access needle exchanged for a 2F micropuncture vascular sheath. The 0.018" wire was then removed and a 0.035" wire advanced into the IVC. An appropriate location for the subcutaneous reservoir was selected below the clavicle and an incision was made through the skin and underlying soft tissues. The subcutaneous tissues were then dissected using a combination of blunt and sharp surgical technique and a pocket was formed. A single lumen power injectable portacatheter [facility]) was then tunneled through the subcutaneous tissues from the pocket to the dermatotomy and the port reservoir placed within the subcutaneous pocket. The venous access site was then serially dilated and a peel away vascular sheath placed over the wire. The wire was removed and the port catheter advanced into position under fluoroscopic guidance. The catheter tip is positioned in the upper right atrium. This was documented with a spot image. The portacatheter was then tested and found to flush and aspirate well. The port was flushed with saline followed by 100 units/mL heparinized saline. The pocket was then closed in two layers using first subdermal inverted interrupted absorbable sutures followed by a running subcuticular suture. The epidermis was then sealed with Dermabond. The dermatotomy at the venous access site was also closed with Dermabond. IMPRESSION: Successful placement of a right IJ approach Power Port with ultrasound and fluoroscopic guidance. The catheter is ready for use. Electronically Signed   By: Malachy Moan M.D.   On: 05/23/2023 16:24    Catarina Hartshorn, DO  [facility]  If 7PM-7AM, please contact night-coverage [email or web address] Password [email or web address] 06/18/2023, 11:15 AM   LOS: 2 days

## 2023-06-18 NOTE — Plan of Care (Signed)

## 2023-06-19 DIAGNOSIS — N179 Acute kidney failure, unspecified: Secondary | ICD-10-CM | POA: Diagnosis not present

## 2023-06-19 DIAGNOSIS — D696 Thrombocytopenia, unspecified: Secondary | ICD-10-CM | POA: Diagnosis not present

## 2023-06-19 LAB — COMPREHENSIVE METABOLIC PANEL
ALT: 14 U/L (ref 0–44)
AST: 14 U/L — ABNORMAL LOW (ref 15–41)
Albumin: <1.5 g/dL — ABNORMAL LOW (ref 3.5–5.0)
Alkaline Phosphatase: 124 U/L (ref 38–126)
Anion gap: 13 (ref 5–15)
BUN: 32 mg/dL — ABNORMAL HIGH (ref 8–23)
CO2: 18 mmol/L — ABNORMAL LOW (ref 22–32)
Calcium: 6.6 mg/dL — ABNORMAL LOW (ref 8.9–10.3)
Chloride: 111 mmol/L (ref 98–111)
Creatinine, Ser: 1.58 mg/dL — ABNORMAL HIGH (ref 0.44–1.00)
GFR, Estimated: 33 mL/min — ABNORMAL LOW (ref >60)
Glucose, Bld: 74 mg/dL (ref 70–99)
Potassium: 4.2 mmol/L (ref 3.5–5.1)
Sodium: 142 mmol/L (ref 135–145)
Total Bilirubin: 0.5 mg/dL (ref <1.2)
Total Protein: 5.8 g/dL — ABNORMAL LOW (ref 6.5–8.1)

## 2023-06-19 LAB — CBC
HCT: 22.1 % — ABNORMAL LOW (ref 36.0–46.0)
Hemoglobin: 7.5 g/dL — ABNORMAL LOW (ref 12.0–15.0)
MCH: 33 pg (ref 26.0–34.0)
MCHC: 33.9 g/dL (ref 30.0–36.0)
MCV: 97.4 fL (ref 80.0–100.0)
Platelets: 80 10*3/uL — ABNORMAL LOW (ref 150–400)
RBC: 2.27 MIL/uL — ABNORMAL LOW (ref 3.87–5.11)
RDW: 18.2 % — ABNORMAL HIGH (ref 11.5–15.5)
WBC: 3.6 10*3/uL — ABNORMAL LOW (ref 4.0–10.5)
nRBC: 0 % (ref 0.0–0.2)

## 2023-06-19 LAB — URINE CULTURE: Culture: >=100000 — AB

## 2023-06-19 LAB — MAGNESIUM: Magnesium: 1.8 mg/dL (ref 1.7–2.4)

## 2023-06-19 MED ORDER — CALCIUM GLUCONATE-NACL 2-0.675 GM/100ML-% IV SOLN: 2.0000 g | Freq: Once | INTRAVENOUS | Status: DC

## 2023-06-19 MED ORDER — CALCIUM CARBONATE ANTACID 500 MG PO CHEW: 1.0000 | CHEWABLE_TABLET | Freq: Three times a day (TID) | ORAL | Status: DC

## 2023-06-19 MED ORDER — CALCIUM GLUCONATE-NACL 1-0.675 GM/50ML-% IV SOLN
1.0000 g | INTRAVENOUS | Status: AC
Start: 2023-06-19 — End: 2023-06-19
  Administered 2023-06-19 (×2): 1000 mg via INTRAVENOUS
  Filled 2023-06-19 (×2): qty 50

## 2023-06-19 MED ORDER — MIDODRINE HCL 2.5 MG PO TABS: 7.5000 mg | ORAL_TABLET | Freq: Three times a day (TID) | ORAL | Status: DC

## 2023-06-19 MED ORDER — ORAL CARE MOUTH RINSE: 15.0000 mL | OROMUCOSAL | Status: DC | PRN

## 2023-06-19 MED ORDER — AMOXICILLIN 250 MG PO CAPS
500.0000 mg | ORAL_CAPSULE | Freq: Three times a day (TID) | ORAL | Status: DC
  Administered 2023-06-20: 500 mg via ORAL
  Filled 2023-06-19: qty 2

## 2023-06-19 MED ORDER — MAGNESIUM SULFATE 2 GM/50ML IV SOLN
2.0000 g | Freq: Once | INTRAVENOUS | Status: AC
  Administered 2023-06-19: 2 g via INTRAVENOUS
  Filled 2023-06-19: qty 50

## 2023-06-19 MED ORDER — MAGNESIUM OXIDE -MG SUPPLEMENT 400 (240 MG) MG PO TABS: 400.0000 mg | ORAL_TABLET | Freq: Every day | ORAL | Status: DC

## 2023-06-19 MED ORDER — SODIUM CHLORIDE 0.9 % IV SOLN: 2.0000 g | INTRAVENOUS | Status: DC

## 2023-06-19 MED ORDER — MAGNESIUM OXIDE -MG SUPPLEMENT 400 (240 MG) MG PO TABS
400.0000 mg | ORAL_TABLET | Freq: Every day | ORAL | Status: DC
  Administered 2023-06-20: 400 mg via ORAL
  Filled 2023-06-19: qty 1

## 2023-06-19 MED ORDER — CEFTRIAXONE SODIUM 2 G IJ SOLR
2.0000 g | INTRAMUSCULAR | Status: AC
  Administered 2023-06-19: 2 g via INTRAVENOUS
  Filled 2023-06-19: qty 20

## 2023-06-19 MED ORDER — SODIUM CHLORIDE 0.45 % IV SOLN: INTRAVENOUS | Status: AC

## 2023-06-19 MED ORDER — TRAMADOL HCL 50 MG PO TABS: 50.0000 mg | ORAL_TABLET | Freq: Three times a day (TID) | ORAL | 0 refills | Status: DC | PRN

## 2023-06-19 MED ORDER — AMOXICILLIN 500 MG PO CAPS: 500.0000 mg | ORAL_CAPSULE | Freq: Three times a day (TID) | ORAL | Status: DC

## 2023-06-19 NOTE — Discharge Summary (Signed)
Physician Discharge Summary   Patient: Kathy [name] MRN: [id number] DOB: Dec 21, 1940  Admit date:     06/16/2023  Discharge date: {dischdate:26783}  Discharge Physician: Onalee Hua Dalores Weger   PCP: [name], MD   Recommendations at discharge:  {Tip this will not be part of the note when signed- Example include specific recommendations for outpatient follow-up, pending tests to follow-up on. (Optional):26781}  ***  Discharge Diagnoses: Principal Problem:   AKI (acute kidney injury) (HCC) Active Problems:   RBBB (right bundle branch block with left anterior fascicular block)   Hypomagnesemia   Thrombocytopenia (HCC)   Hypocalcemia   Gram-negative bacteremia  Resolved Problems:   * No resolved hospital problems. *  Hospital Course: 82 year old female with history of myeloma, hypertension, GERD, hypothyroidism, OSA presenting with generalized weakness for the last 3 to 4 days prior to admission.  The patient normally resides at [facility] in [place].  The patient presented to the [facility] cancer center for her usual daratumumab injection on 06/16/2023.  Routine blood work was obtained and showed the patient to have acute kidney injury, hypomagnesemia, and hypocalcemia.  The patient was given 2 g calcium and transferred to the emergency department for further evaluation and treatment.  The patient denies any fevers, chills, headache, chest pain, shortness of breath, coughing, hemoptysis, nausea, vomiting, diarrhea, abdominal pain.  There is no hematochezia or melena.  She states that her oral intake has been poor.  At baseline, the patient ambulates with a walker.  She denies any other new medications.  Her last daratumumab injection was on 06/01/2023. In the ED, the patient was afebrile and tachycardic in the 110s.  Blood pressure was initially soft with SBP in the 90s.  The patient was given 1 L normal saline and 2 g magnesium.  BMP showed sodium 137, potassium 3.3, bicarbonate 21,  serum creatinine 2.38.  LFTs were unremarkable.  Magnesium 1.5.  Corrected calcium 7.8.  WBC 6.5, hemoglobin 7.9, platelets 75,000.  EKG shows sinus rhythm at a rate bundle branch block. Notably, the patient was recently mated to the hospital from 05/11/2023 to 05/18/2023.  During that hospital admission, the patient was treated for E. coli UTI which was felt to be contributing to the patient's acute metabolic encephalopathy.  She was discharged with cephalexin.  The patient was also transfused 1 unit PRBC.  She was noted to have acute kidney injury which improved with IV fluids.  Assessment and Plan:     Acute kidney injury -Secondary to volume depletion and sepsis -Baseline creatinine 0.7-1.0 -Presented with serum creatinine 2.38 -Continue IV fluids>>improving -UA 21-50 WBC   E coli bacteremia -source is urine -Initially on zosyn 2 grams daily>>now on ceftriaxone pending final culture data -d/c with amoxil x 7 more days   UTI--Ecoli -UA 21-50 WBC -continue ceftriaxone pending final culture data -d/c with amoxil x 7 more days   Hypocalcemia -Corrected calcium 7.7>>8.4 -Repeat albumin and calcium 06/17/2023 -give additional 2 g calcium gluconate -continue TUMS TID   Dysphagia/Odynophagia -solids and liquids x 6 months -GI consulted>>stable for outpt work up as pt is tolerating diet presently   Anemia of chronic disease -baseline Hgb ~8 -drop in Hgb partly dilutional and from blood draws -transfused one unit PRBC 11/15   Hypomagnesemia -Replete -d/c with mag ox 400 mg daily   Hypokalemia>>hyperkalemia -Replaced initially -now remove KCl from IVF   Myeloma -follow with Dr. Ellin Saba -diagnosed 04/08/23 via [facility] -last dose daratumumab 06/01/23   Thrombocytopenia -due to myeloma  and immune therapy and infection   Chronic hypotension -continue midodrine   Hypothyroidism -continue synthroid -TSH 2.916    RA On weekly adalimumab at home     {(NOTE) Pain  control PDMP Statment (Optional):26782} Consultants: *** Procedures performed: ***  Disposition: {Plan; Disposition:26390} Diet recommendation:  {Diet_Plan:26776} DISCHARGE MEDICATION: Allergies as of 06/19/2023       Reactions   Macrobid [nitrofurantoin] Other (See Comments)   Stomach pain     Med Rec must be completed prior to using this [facility]***       Discharge Exam: Filed Weights   06/16/23 1128  Weight: 59.4 kg   ***  Condition at discharge: {DC Condition:26389}  The results of significant diagnostics from this hospitalization (including imaging, microbiology, ancillary and laboratory) are listed below for reference.   Imaging Studies: IR IMAGING GUIDED PORT INSERTION  Result Date: 05/23/2023 INDICATION: 82 year old female with multiple myeloma not having achieved remission. She has very poor venous access and presents for placement of a port catheter. EXAM: IMPLANTED PORT A CATH PLACEMENT WITH ULTRASOUND AND FLUOROSCOPIC GUIDANCE MEDICATIONS: None. ANESTHESIA/SEDATION: Versed 1 mg IV; Fentanyl 100 mcg IV; administered by the radiology nurse Moderate Sedation Time:  16 minutes The patient's vital signs and level of consciousness were continuously monitored during the procedure by the interventional radiology nurse under my direct supervision. FLUOROSCOPY: Radiation exposure index: 1 mg reference air kerma COMPLICATIONS: None immediate. PROCEDURE: The right neck and chest was prepped with chlorhexidine, and draped in the usual sterile fashion using maximum barrier technique (cap and mask, sterile gown, sterile gloves, large sterile sheet, hand hygiene and cutaneous antiseptic). Local anesthesia was attained by infiltration with 1% lidocaine with epinephrine. Ultrasound demonstrated patency of the right internal jugular vein, and this was documented with an image. Under real-time ultrasound guidance, this vein was accessed with a 21 gauge micropuncture needle and image  documentation was performed. A small dermatotomy was made at the access site with an 11 scalpel. A 0.018" wire was advanced into the SVC and the access needle exchanged for a 84F micropuncture vascular sheath. The 0.018" wire was then removed and a 0.035" wire advanced into the IVC. An appropriate location for the subcutaneous reservoir was selected below the clavicle and an incision was made through the skin and underlying soft tissues. The subcutaneous tissues were then dissected using a combination of blunt and sharp surgical technique and a pocket was formed. A single lumen power injectable portacatheter [facility]) was then tunneled through the subcutaneous tissues from the pocket to the dermatotomy and the port reservoir placed within the subcutaneous pocket. The venous access site was then serially dilated and a peel away vascular sheath placed over the wire. The wire was removed and the port catheter advanced into position under fluoroscopic guidance. The catheter tip is positioned in the upper right atrium. This was documented with a spot image. The portacatheter was then tested and found to flush and aspirate well. The port was flushed with saline followed by 100 units/mL heparinized saline. The pocket was then closed in two layers using first subdermal inverted interrupted absorbable sutures followed by a running subcuticular suture. The epidermis was then sealed with Dermabond. The dermatotomy at the venous access site was also closed with Dermabond. IMPRESSION: Successful placement of a right IJ approach Power Port with ultrasound and fluoroscopic guidance. The catheter is ready for use. Electronically Signed   By: Malachy Moan M.D.   On: 05/23/2023 16:24    Microbiology: Results for  orders placed or performed during the hospital encounter of 06/16/23  Urine Culture     Status: Abnormal   Collection Time: 06/16/23  4:15 PM   Specimen: Urine, Random  Result Value Ref Range  Status   Specimen Description   Final    URINE, RANDOM Performed at [facility], [address] [place], [place] [address]    Special Requests   Final    NONE Reflexed from [id number] Performed at [facility], [address] [place], [place] [address]    Culture >=100,000 COLONIES/mL ESCHERICHIA COLI (A)  Final   Report Status 06/19/2023 FINAL  Final   Organism ID, Bacteria ESCHERICHIA COLI (A)  Final      Susceptibility   Escherichia coli - MIC*    AMPICILLIN 4 SENSITIVE Sensitive     CEFAZOLIN <=4 SENSITIVE Sensitive     CEFEPIME <=0.12 SENSITIVE Sensitive     CEFTRIAXONE <=0.25 SENSITIVE Sensitive     CIPROFLOXACIN <=0.25 SENSITIVE Sensitive     GENTAMICIN <=1 SENSITIVE Sensitive     IMIPENEM <=0.25 SENSITIVE Sensitive     NITROFURANTOIN <=16 SENSITIVE Sensitive     TRIMETH/SULFA <=20 SENSITIVE Sensitive     AMPICILLIN/SULBACTAM <=2 SENSITIVE Sensitive     PIP/TAZO <=4 SENSITIVE Sensitive ug/mL    * >=100,000 COLONIES/mL ESCHERICHIA COLI  Culture, blood (Routine X 2) w Reflex to ID Panel     Status: None (Preliminary result)   Collection Time: 06/16/23  4:39 PM   Specimen: Porta Cath; Blood  Result Value Ref Range Status   Specimen Description   Final    PORTA CATH Performed at [facility], [address], [place], [place] [address]    Special Requests   Final    BOTTLES DRAWN AEROBIC AND ANAEROBIC Blood Culture adequate volume Performed at [facility], [address] [place], [place] [address]    Culture  Setup Time   Final    GRAM NEGATIVE RODS IN BOTH AEROBIC AND ANAEROBIC BOTTLES Gram Stain Report Called to,Read Back By and Verified With: RENE T @ 0809 ON [id number] BY HENDERSON L Performed at [facility], [address] [place], [place] [address]    Culture   Final    Romie Minus NEGATIVE RODS IDENTIFICATION TO FOLLOW Performed at [facility], [address], [place], [place] [address]    Report Status PENDING  Incomplete  Culture, blood (Routine X 2) w Reflex to  ID Panel     Status: Abnormal (Preliminary result)   Collection Time: 06/16/23  7:01 PM   Specimen: BLOOD  Result Value Ref Range Status   Specimen Description BLOOD LEFT ANTECUBITAL  Final   Special Requests   Final    BOTTLES DRAWN AEROBIC ONLY Blood Culture results may not be optimal due to an excessive volume of blood received in culture bottles   Culture  Setup Time   Final    GRAM NEGATIVE RODS AEROBIC BOTTLE ONLY Gram Stain Report Called to,Read Back By and Verified With: RENE T @ 0920 ON [id number] BY HENDERSO L CRITICAL RESULT CALLED TO, READ BACK BY AND VERIFIED WITH: PHARMD S HURTH [id number] AT 1308 BY CM    Culture ESCHERICHIA COLI SUSCEPTIBILITIES TO FOLLOW  (A)  Final   Report Status PENDING  Incomplete   Organism ID, Bacteria ESCHERICHIA COLI  Final      Susceptibility   Escherichia coli - MIC*    AMPICILLIN 8 SENSITIVE Sensitive     CEFEPIME <=0.12 SENSITIVE Sensitive  CEFTAZIDIME <=1 SENSITIVE Sensitive     CEFTRIAXONE <=0.25 SENSITIVE Sensitive     CIPROFLOXACIN <=0.25 SENSITIVE Sensitive     GENTAMICIN <=1 SENSITIVE Sensitive     IMIPENEM <=0.25 SENSITIVE Sensitive     TRIMETH/SULFA <=20 SENSITIVE Sensitive     AMPICILLIN/SULBACTAM <=2 SENSITIVE Sensitive     PIP/TAZO Value in next row Sensitive ug/mL     <=4 SENSITIVEPerformed at [facility], [address], [place], [place] [address]    * ESCHERICHIA COLI  Blood Culture ID Panel (Reflexed)     Status: Abnormal   Collection Time: 06/16/23  7:01 PM  Result Value Ref Range Status   Enterococcus faecalis NOT DETECTED NOT DETECTED Final   Enterococcus Faecium NOT DETECTED NOT DETECTED Final   Listeria monocytogenes NOT DETECTED NOT DETECTED Final   Staphylococcus species NOT DETECTED NOT DETECTED Final   Staphylococcus aureus (BCID) NOT DETECTED NOT DETECTED Final   Staphylococcus epidermidis NOT DETECTED NOT DETECTED Final   Staphylococcus lugdunensis NOT DETECTED NOT DETECTED Final   Streptococcus  species NOT DETECTED NOT DETECTED Final   Streptococcus agalactiae NOT DETECTED NOT DETECTED Final   Streptococcus pneumoniae NOT DETECTED NOT DETECTED Final   Streptococcus pyogenes NOT DETECTED NOT DETECTED Final   A.calcoaceticus-baumannii NOT DETECTED NOT DETECTED Final   Bacteroides fragilis NOT DETECTED NOT DETECTED Final   Enterobacterales DETECTED (A) NOT DETECTED Final    Comment: Enterobacterales represent a large order of gram negative bacteria, not a single organism. CRITICAL RESULT CALLED TO, READ BACK BY AND VERIFIED WITH: PHARMD S HURTH [id number] AT 1308 BY CM    Enterobacter cloacae complex NOT DETECTED NOT DETECTED Final   Escherichia coli DETECTED (A) NOT DETECTED Final    Comment: CRITICAL RESULT CALLED TO, READ BACK BY AND VERIFIED WITH: PHARMD S HURTH [id number] AT 1308 BY CM    Klebsiella aerogenes NOT DETECTED NOT DETECTED Final   Klebsiella oxytoca NOT DETECTED NOT DETECTED Final   Klebsiella pneumoniae NOT DETECTED NOT DETECTED Final   Proteus species NOT DETECTED NOT DETECTED Final   Salmonella species NOT DETECTED NOT DETECTED Final   Serratia marcescens NOT DETECTED NOT DETECTED Final   Haemophilus influenzae NOT DETECTED NOT DETECTED Final   Neisseria meningitidis NOT DETECTED NOT DETECTED Final   Pseudomonas aeruginosa NOT DETECTED NOT DETECTED Final   Stenotrophomonas maltophilia NOT DETECTED NOT DETECTED Final   Candida albicans NOT DETECTED NOT DETECTED Final   Candida auris NOT DETECTED NOT DETECTED Final   Candida glabrata NOT DETECTED NOT DETECTED Final   Candida krusei NOT DETECTED NOT DETECTED Final   Candida parapsilosis NOT DETECTED NOT DETECTED Final   Candida tropicalis NOT DETECTED NOT DETECTED Final   Cryptococcus neoformans/gattii NOT DETECTED NOT DETECTED Final   CTX-M ESBL NOT DETECTED NOT DETECTED Final   Carbapenem resistance IMP NOT DETECTED NOT DETECTED Final   Carbapenem resistance KPC NOT DETECTED NOT DETECTED Final   Carbapenem  resistance NDM NOT DETECTED NOT DETECTED Final   Carbapenem resist OXA 48 LIKE NOT DETECTED NOT DETECTED Final   Carbapenem resistance VIM NOT DETECTED NOT DETECTED Final    Comment: Performed at [facility], [address] [place], [place] [address]    Labs: CBC: Recent Labs  Lab 06/16/23 [phone] 06/17/23 0118 06/17/23 0242 06/18/23 0453 06/19/23 0733  WBC 6.5 3.9* 4.5 3.9* 3.6*  NEUTROABS 4.8  --   --   --   --   HGB 7.9* 6.0* 7.0* 7.7* 7.5*  HCT 21.6*  17.0* 20.5* 23.5* 22.1*  MCV 92.3 95.0 98.1 97.5 97.4  PLT 75* 62* 64* 75* 80*   Basic Metabolic Panel: Recent Labs  Lab 06/16/23 0846 06/16/23 2045 06/17/23 0242 06/18/23 0453 06/19/23 0423  NA 137 140 140 145 142  K 3.3* 3.3* 5.3* 4.4 4.2  CL 102 110 112* 116* 111  CO2 21* 19* 16* 18* 18*  GLUCOSE 101* 113* 94 93 74  BUN 61* 54* 50* 44* 32*  CREATININE 2.38* 2.21* 2.12* 1.81* 1.58*  CALCIUM 5.8* 5.6* 5.7* 6.4* 6.6*  MG 1.5*  --  2.1 1.9 1.8   Liver Function Tests: Recent Labs  Lab 06/16/23 0846 06/16/23 2045 06/17/23 0242 06/19/23 0423  AST 14* 15 23 14*  ALT 15 15 15 14   ALKPHOS 177* 136* 159* 124  BILITOT 1.3* 1.0 1.2* 0.5  PROT 7.1 5.6* 6.4* 5.8*  ALBUMIN 1.5* <1.5* <1.5* <1.5*   CBG: No results for input(s): "GLUCAP" in the last 168 hours.  Discharge time spent: {LESS THAN/GREATER ZOXW:96045} 30 minutes.  Signed: Catarina Hartshorn, MD [facility] 06/19/2023

## 2023-06-19 NOTE — Plan of Care (Signed)
Patient alert and oriented. No acute distress noted, no complaints voiced. All needs attended to this shift. Will continue to monitor.   Problem: Education: Goal: Knowledge of General Education information will improve Description: Including pain rating scale, medication(s)/side effects and non-pharmacologic comfort measures Outcome: Progressing   Problem: Health Behavior/Discharge Planning: Goal: Ability to manage health-related needs will improve Outcome: Progressing   Problem: Clinical Measurements: Goal: Ability to maintain clinical measurements within normal limits will improve Outcome: Progressing Goal: Will remain free from infection Outcome: Progressing Goal: Diagnostic test results will improve Outcome: Progressing Goal: Respiratory complications will improve Outcome: Progressing Goal: Cardiovascular complication will be avoided Outcome: Progressing   Problem: Activity: Goal: Risk for activity intolerance will decrease Outcome: Progressing   Problem: Nutrition: Goal: Adequate nutrition will be maintained Outcome: Progressing   Problem: Coping: Goal: Level of anxiety will decrease Outcome: Progressing   Problem: Elimination: Goal: Will not experience complications related to bowel motility Outcome: Progressing Goal: Will not experience complications related to urinary retention Outcome: Progressing   Problem: Pain Management: Goal: General experience of comfort will improve Outcome: Progressing   Problem: Safety: Goal: Ability to remain free from injury will improve Outcome: Progressing   Problem: Skin Integrity: Goal: Risk for impaired skin integrity will decrease Outcome: Progressing

## 2023-06-19 NOTE — [place] Notes (Signed)
PROGRESS NOTE  Kathy [name] HQI:[id number] DOB: 02-Jun-1941 DOA: 06/16/2023 PCP: [name], MD  Brief History:  82 year old female with history of myeloma, hypertension, GERD, hypothyroidism, OSA presenting with generalized weakness for the last 3 to 4 days prior to admission.  The patient normally resides at [facility] in [place].  The patient presented to the [facility] cancer center for her usual daratumumab injection on 06/16/2023.  Routine blood work was obtained and showed the patient to have acute kidney injury, hypomagnesemia, and hypocalcemia.  The patient was given 2 g calcium and transferred to the emergency department for further evaluation and treatment.  The patient denies any fevers, chills, headache, chest pain, shortness of breath, coughing, hemoptysis, nausea, vomiting, diarrhea, abdominal pain.  There is no hematochezia or melena.  She states that her oral intake has been poor.  At baseline, the patient ambulates with a walker.  She denies any other new medications.  Her last daratumumab injection was on 06/01/2023. In the ED, the patient was afebrile and tachycardic in the 110s.  Blood pressure was initially soft with SBP in the 90s.  The patient was given 1 L normal saline and 2 g magnesium.  BMP showed sodium 137, potassium 3.3, bicarbonate 21, serum creatinine 2.38.  LFTs were unremarkable.  Magnesium 1.5.  Corrected calcium 7.8.  WBC 6.5, hemoglobin 7.9, platelets 75,000.  EKG shows sinus rhythm at a rate bundle branch block. Notably, the patient was recently mated to the hospital from 05/11/2023 to 05/18/2023.  During that hospital admission, the patient was treated for E. coli UTI which was felt to be contributing to the patient's acute metabolic encephalopathy.  She was discharged with cephalexin.  The patient was also transfused 1 unit PRBC.  She was noted to have acute kidney injury which improved with IV fluids.   Assessment/Plan:   Acute kidney  injury -Secondary to volume depletion and sepsis -Baseline creatinine 0.7-1.0 -Presented with serum creatinine 2.38 -Continue IV fluids>>improving -UA 21-50 WBC   E coli bacteremia -source is urine -Initially on zosyn 2 grams daily>>now on ceftriaxone pending final culture data -d/c with amoxil x 7 more days   UTI--Ecoli -UA 21-50 WBC -continue ceftriaxone pending final culture data -d/c with amoxil x 7 more days   Hypocalcemia -Corrected calcium 7.7>>8.4 -Repeat albumin and calcium 06/17/2023 -give additional 2 g calcium gluconate -continue TUMS TID   Dysphagia/Odynophagia -solids and liquids x 6 months -GI consulted>>stable for outpt work up as pt is tolerating diet presently -pt is tolerating diet presently   Anemia of chronic disease -baseline Hgb ~8 -drop in Hgb partly dilutional and from blood draws -transfused one unit PRBC 11/15   Hypomagnesemia -Replete -d/c with mag ox 400 mg daily   Hypokalemia>>hyperkalemia -Replaced initially -now remove KCl from IVF   Myeloma -follow with Dr. 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  Denies f/c, cp, sob, n/v/d, abd pain  Objective: Vitals:   06/18/23 1257 06/18/23 2029 06/19/23 0300 06/19/23 0844  BP: 105/64 (!) 86/54 93/64 96/62   Pulse: 83 68 68 78  Resp: 17 18 18 17   Temp: 97.9 F (36.6 C) 98.1  F (36.7 C) 98 F (36.7 C)   TempSrc: Oral     SpO2: 100% 100% 100% 100%  Weight:      Height:        Intake/Output Summary (Last 24 hours) at 06/19/2023 1211 Last data filed at 06/19/2023 0900 Gross per 24 hour  Intake 2123.53 ml  Output 1500 ml  Net 623.53 ml   Weight change:  Exam:  General:  Pt  is alert, follows commands appropriately, not in acute distress HEENT: No icterus, No thrush, No neck mass, [place]/AT Cardiovascular: RRR, S1/S2, no rubs, no gallops Respiratory: fine bibasilar crackles. No wheeze Abdomen: Soft/+BS, non tender, non distended, no guarding Extremities: No edema, No lymphangitis, No petechiae, No rashes, no synovitis   Data Reviewed: I have personally reviewed following labs and imaging studies Basic Metabolic Panel: Recent Labs  Lab 06/16/23 0846 06/16/23 2045 06/17/23 0242 06/18/23 0453 06/19/23 0423  NA 137 140 140 145 142  K 3.3* 3.3* 5.3* 4.4 4.2  CL 102 110 112* 116* 111  CO2 21* 19* 16* 18* 18*  GLUCOSE 101* 113* 94 93 74  BUN 61* 54* 50* 44* 32*  CREATININE 2.38* 2.21* 2.12* 1.81* 1.58*  CALCIUM 5.8* 5.6* 5.7* 6.4* 6.6*  MG 1.5*  --  2.1 1.9 1.8   Liver Function Tests: Recent Labs  Lab 06/16/23 0846 06/16/23 2045 06/17/23 0242 06/19/23 0423  AST 14* 15 23 14*  ALT 15 15 15 14   ALKPHOS 177* 136* 159* 124  BILITOT 1.3* 1.0 1.2* 0.5  PROT 7.1 5.6* 6.4* 5.8*  ALBUMIN 1.5* <1.5* <1.5* <1.5*   No results for input(s): "LIPASE", "AMYLASE" in the last 168 hours. No results for input(s): "AMMONIA" in the last 168 hours. Coagulation Profile: No results for input(s): "INR", "PROTIME" in the last 168 hours. CBC: Recent Labs  Lab 06/16/23 0846 06/17/23 0118 06/17/23 0242 06/18/23 0453 06/19/23 0733  WBC 6.5 3.9* 4.5 3.9* 3.6*  NEUTROABS 4.8  --   --   --   --   HGB 7.9* 6.0* 7.0* 7.7* 7.5*  HCT 21.6* 17.0* 20.5* 23.5* 22.1*  MCV 92.3 95.0 98.1 97.5 97.4  PLT 75* 62* 64* 75* 80*   Cardiac Enzymes: No results for input(s): "CKTOTAL", "CKMB", "CKMBINDEX", "TROPONINI" in the last 168 hours. BNP: Invalid input(s): "POCBNP" CBG: No results for input(s): "GLUCAP" in the last 168 hours. HbA1C: No results for input(s): "HGBA1C" in the last 72 hours. Urine analysis:    Component Value Date/Time   COLORURINE YELLOW 06/16/2023 1615    APPEARANCEUR HAZY (A) 06/16/2023 1615   LABSPEC 1.008 06/16/2023 1615   PHURINE 6.0 06/16/2023 1615   GLUCOSEU NEGATIVE 06/16/2023 1615   HGBUR MODERATE (A) 06/16/2023 1615   BILIRUBINUR NEGATIVE 06/16/2023 1615   KETONESUR NEGATIVE 06/16/2023 1615   PROTEINUR 30 (A) 06/16/2023 1615   UROBILINOGEN 0.2 09/26/2013 1042   NITRITE NEGATIVE 06/16/2023 1615   LEUKOCYTESUR LARGE (A) 06/16/2023 1615   Sepsis Labs: @LABRCNTIP (procalcitonin:4,lacticidven:4) ) Recent Results (from the past 240 hour(s))  Urine Culture     Status: Abnormal   Collection Time: 06/16/23  4:15 PM   Specimen: Urine, Random  Result Value Ref Range Status   Specimen Description   Final    URINE, RANDOM Performed at [facility], [address], [place], [place] [address]    Special Requests   Final    NONE Reflexed from [id number] Performed at [facility], [address] [place], [place] [address]    Culture >=100,000 COLONIES/mL ESCHERICHIA  COLI (A)  Final   Report Status 06/19/2023 FINAL  Final   Organism ID, Bacteria ESCHERICHIA COLI (A)  Final      Susceptibility   Escherichia coli - MIC*    AMPICILLIN 4 SENSITIVE Sensitive     CEFAZOLIN <=4 SENSITIVE Sensitive     CEFEPIME <=0.12 SENSITIVE Sensitive     CEFTRIAXONE <=0.25 SENSITIVE Sensitive     CIPROFLOXACIN <=0.25 SENSITIVE Sensitive     GENTAMICIN <=1 SENSITIVE Sensitive     IMIPENEM <=0.25 SENSITIVE Sensitive     NITROFURANTOIN <=16 SENSITIVE Sensitive     TRIMETH/SULFA <=20 SENSITIVE Sensitive     AMPICILLIN/SULBACTAM <=2 SENSITIVE Sensitive     PIP/TAZO <=4 SENSITIVE Sensitive ug/mL    * >=100,000 COLONIES/mL ESCHERICHIA COLI  Culture, blood (Routine X 2) w Reflex to ID Panel     Status: None (Preliminary result)   Collection Time: 06/16/23  4:39 PM   Specimen: Porta Cath; Blood  Result Value Ref Range Status   Specimen Description   Final    PORTA CATH Performed at [facility], [address] [place], [place] [address]    Special Requests    Final    BOTTLES DRAWN AEROBIC AND ANAEROBIC Blood Culture adequate volume Performed at [facility], [address], [place], [place] [address]    Culture  Setup Time   Final    GRAM NEGATIVE RODS IN BOTH AEROBIC AND ANAEROBIC BOTTLES Gram Stain Report Called to,Read Back By and Verified With: RENE T @ 0809 ON [id number] BY HENDERSON L Performed at [facility], [address], [place], [place] [address]    Culture   Final    Romie Minus NEGATIVE RODS IDENTIFICATION TO FOLLOW Performed at [facility], [address], [place], [place] [address]    Report Status PENDING  Incomplete  Culture, blood (Routine X 2) w Reflex to ID Panel     Status: Abnormal (Preliminary result)   Collection Time: 06/16/23  7:01 PM   Specimen: BLOOD  Result Value Ref Range Status   Specimen Description BLOOD LEFT ANTECUBITAL  Final   Special Requests   Final    BOTTLES DRAWN AEROBIC ONLY Blood Culture results may not be optimal due to an excessive volume of blood received in culture bottles   Culture  Setup Time   Final    GRAM NEGATIVE RODS AEROBIC BOTTLE ONLY Gram Stain Report Called to,Read Back By and Verified With: RENE T @ 0920 ON [id number] BY HENDERSO L CRITICAL RESULT CALLED TO, READ BACK BY AND VERIFIED WITH: PHARMD S HURTH [id number] AT 1308 BY CM    Culture ESCHERICHIA COLI SUSCEPTIBILITIES TO FOLLOW  (A)  Final   Report Status PENDING  Incomplete   Organism ID, Bacteria ESCHERICHIA COLI  Final      Susceptibility   Escherichia coli - MIC*    AMPICILLIN 8 SENSITIVE Sensitive     CEFEPIME <=0.12 SENSITIVE Sensitive     CEFTAZIDIME <=1 SENSITIVE Sensitive     CEFTRIAXONE <=0.25 SENSITIVE Sensitive     CIPROFLOXACIN <=0.25 SENSITIVE Sensitive     GENTAMICIN <=1 SENSITIVE Sensitive     IMIPENEM <=0.25 SENSITIVE Sensitive     TRIMETH/SULFA <=20 SENSITIVE Sensitive     AMPICILLIN/SULBACTAM <=2 SENSITIVE Sensitive     PIP/TAZO Value in next row Sensitive ug/mL     <=4 SENSITIVEPerformed at [facility], [address] [place], [place] [address]    * ESCHERICHIA COLI  Blood Culture ID Panel (Reflexed)  Status: Abnormal   Collection Time: 06/16/23  7:01 PM  Result Value Ref Range Status   Enterococcus faecalis NOT DETECTED NOT DETECTED Final   Enterococcus Faecium NOT DETECTED NOT DETECTED Final   Listeria monocytogenes NOT DETECTED NOT DETECTED Final   Staphylococcus species NOT DETECTED NOT DETECTED Final   Staphylococcus aureus (BCID) NOT DETECTED NOT DETECTED Final   Staphylococcus epidermidis NOT DETECTED NOT DETECTED Final   Staphylococcus lugdunensis NOT DETECTED NOT DETECTED Final   Streptococcus species NOT DETECTED NOT DETECTED Final   Streptococcus agalactiae NOT DETECTED NOT DETECTED Final   Streptococcus pneumoniae NOT DETECTED NOT DETECTED Final   Streptococcus pyogenes NOT DETECTED NOT DETECTED Final   A.calcoaceticus-baumannii NOT DETECTED NOT DETECTED Final   Bacteroides fragilis NOT DETECTED NOT DETECTED Final   Enterobacterales DETECTED (A) NOT DETECTED Final    Comment: Enterobacterales represent a large order of gram negative bacteria, not a single organism. CRITICAL RESULT CALLED TO, READ BACK BY AND VERIFIED WITH: PHARMD S HURTH [id number] AT 1308 BY CM    Enterobacter cloacae complex NOT DETECTED NOT DETECTED Final   Escherichia coli DETECTED (A) NOT DETECTED Final    Comment: CRITICAL RESULT CALLED TO, READ BACK BY AND VERIFIED WITH: PHARMD S HURTH [id number] AT 1308 BY CM    Klebsiella aerogenes NOT DETECTED NOT DETECTED Final   Klebsiella oxytoca NOT DETECTED NOT DETECTED Final   Klebsiella pneumoniae NOT DETECTED NOT DETECTED Final   Proteus species NOT DETECTED NOT DETECTED Final   Salmonella species NOT DETECTED NOT DETECTED Final   Serratia marcescens NOT DETECTED NOT DETECTED Final   Haemophilus influenzae NOT DETECTED NOT DETECTED Final   Neisseria meningitidis NOT DETECTED NOT DETECTED Final   Pseudomonas aeruginosa NOT DETECTED NOT DETECTED  Final   Stenotrophomonas maltophilia NOT DETECTED NOT DETECTED Final   Candida albicans NOT DETECTED NOT DETECTED Final   Candida auris NOT DETECTED NOT DETECTED Final   Candida glabrata NOT DETECTED NOT DETECTED Final   Candida krusei NOT DETECTED NOT DETECTED Final   Candida parapsilosis NOT DETECTED NOT DETECTED Final   Candida tropicalis NOT DETECTED NOT DETECTED Final   Cryptococcus neoformans/gattii NOT DETECTED NOT DETECTED Final   CTX-M ESBL NOT DETECTED NOT DETECTED Final   Carbapenem resistance IMP NOT DETECTED NOT DETECTED Final   Carbapenem resistance KPC NOT DETECTED NOT DETECTED Final   Carbapenem resistance NDM NOT DETECTED NOT DETECTED Final   Carbapenem resist OXA 48 LIKE NOT DETECTED NOT DETECTED Final   Carbapenem resistance VIM NOT DETECTED NOT DETECTED Final    Comment: Performed at [facility], [address] [place], [place] [address]     Scheduled Meds:  acyclovir  400 mg Oral BID   [START ON 06/20/2023] amoxicillin  500 mg Oral Q8H   calcium carbonate  1 tablet Oral TID WC   Chlorhexidine Gluconate Cloth  6 each Topical Daily   ferrous sulfate  325 mg Oral Daily   levothyroxine  68.5 mcg Oral Q0600   [START ON 06/20/2023] magnesium oxide  400 mg Oral Daily   midodrine  7.5 mg Oral TID WC   multivitamin with minerals  1 tablet Oral q morning   omega-3 acid ethyl esters  1 capsule Oral Daily   oxybutynin  10 mg Oral QHS   pantoprazole  80 mg Oral Daily   polyethylene glycol  17 g Oral Daily   Continuous Infusions:  sodium chloride 75 mL/hr at 06/19/23 0622   calcium gluconate  cefTRIAXone (ROCEPHIN)  IV     magnesium sulfate bolus IVPB 2 g (06/19/23 1135)    Procedures/Studies: IR IMAGING GUIDED PORT INSERTION  Result Date: 05/23/2023 INDICATION: 82 year old female with multiple myeloma not having achieved remission. She has very poor venous access and presents for placement of a port catheter. EXAM: IMPLANTED PORT A CATH PLACEMENT WITH  ULTRASOUND AND FLUOROSCOPIC GUIDANCE MEDICATIONS: None. ANESTHESIA/SEDATION: Versed 1 mg IV; Fentanyl 100 mcg IV; administered by the radiology nurse Moderate Sedation Time:  16 minutes The patient's vital signs and level of consciousness were continuously monitored during the procedure by the interventional radiology nurse under my direct supervision. FLUOROSCOPY: Radiation exposure index: 1 mg reference air kerma COMPLICATIONS: None immediate. PROCEDURE: The right neck and chest was prepped with chlorhexidine, and draped in the usual sterile fashion using maximum barrier technique (cap and mask, sterile gown, sterile gloves, large sterile sheet, hand hygiene and cutaneous antiseptic). Local anesthesia was attained by infiltration with 1% lidocaine with epinephrine. Ultrasound demonstrated patency of the right internal jugular vein, and this was documented with an image. Under real-time ultrasound guidance, this vein was accessed with a 21 gauge micropuncture needle and image documentation was performed. A small dermatotomy was made at the access site with an 11 scalpel. A 0.018" wire was advanced into the SVC and the access needle exchanged for a 24F micropuncture vascular sheath. The 0.018" wire was then removed and a 0.035" wire advanced into the IVC. An appropriate location for the subcutaneous reservoir was selected below the clavicle and an incision was made through the skin and underlying soft tissues. The subcutaneous tissues were then dissected using a combination of blunt and sharp surgical technique and a pocket was formed. A single lumen power injectable portacatheter [facility]) was then tunneled through the subcutaneous tissues from the pocket to the dermatotomy and the port reservoir placed within the subcutaneous pocket. The venous access site was then serially dilated and a peel away vascular sheath placed over the wire. The wire was removed and the port catheter advanced into  position under fluoroscopic guidance. The catheter tip is positioned in the upper right atrium. This was documented with a spot image. The portacatheter was then tested and found to flush and aspirate well. The port was flushed with saline followed by 100 units/mL heparinized saline. The pocket was then closed in two layers using first subdermal inverted interrupted absorbable sutures followed by a running subcuticular suture. The epidermis was then sealed with Dermabond. The dermatotomy at the venous access site was also closed with Dermabond. IMPRESSION: Successful placement of a right IJ approach Power Port with ultrasound and fluoroscopic guidance. The catheter is ready for use. Electronically Signed   By: Malachy Moan M.D.   On: 05/23/2023 16:24    Catarina Hartshorn, DO  [facility]  If 7PM-7AM, please contact night-coverage [email or web address] Password [email or web address] 06/19/2023, 12:11 PM   LOS: 3 days

## 2023-06-20 ENCOUNTER — Encounter (HOSPITAL_COMMUNITY): Payer: Self-pay | Admitting: Internal Medicine

## 2023-06-20 ENCOUNTER — Encounter: Payer: Self-pay | Admitting: *Deleted

## 2023-06-20 DIAGNOSIS — Z7189 Other specified counseling: Secondary | ICD-10-CM | POA: Diagnosis not present

## 2023-06-20 DIAGNOSIS — N179 Acute kidney failure, unspecified: Secondary | ICD-10-CM | POA: Diagnosis not present

## 2023-06-20 DIAGNOSIS — Z515 Encounter for palliative care: Secondary | ICD-10-CM

## 2023-06-20 DIAGNOSIS — D696 Thrombocytopenia, unspecified: Secondary | ICD-10-CM | POA: Diagnosis not present

## 2023-06-20 LAB — MAGNESIUM: Magnesium: 2.1 mg/dL (ref 1.7–2.4)

## 2023-06-20 LAB — BASIC METABOLIC PANEL
Anion gap: 9 (ref 5–15)
BUN: 23 mg/dL (ref 8–23)
CO2: 19 mmol/L — ABNORMAL LOW (ref 22–32)
Calcium: 7.1 mg/dL — ABNORMAL LOW (ref 8.9–10.3)
Chloride: 110 mmol/L (ref 98–111)
Creatinine, Ser: 1.36 mg/dL — ABNORMAL HIGH (ref 0.44–1.00)
GFR, Estimated: 39 mL/min — ABNORMAL LOW (ref >60)
Glucose, Bld: 75 mg/dL (ref 70–99)
Potassium: 3.3 mmol/L — ABNORMAL LOW (ref 3.5–5.1)
Sodium: 138 mmol/L (ref 135–145)

## 2023-06-20 LAB — CBC
HCT: 22.4 % — ABNORMAL LOW (ref 36.0–46.0)
Hemoglobin: 7.6 g/dL — ABNORMAL LOW (ref 12.0–15.0)
MCH: 32.8 pg (ref 26.0–34.0)
MCHC: 33.9 g/dL (ref 30.0–36.0)
MCV: 96.6 fL (ref 80.0–100.0)
Platelets: 84 10*3/uL — ABNORMAL LOW (ref 150–400)
RBC: 2.32 MIL/uL — ABNORMAL LOW (ref 3.87–5.11)
RDW: 18.4 % — ABNORMAL HIGH (ref 11.5–15.5)
WBC: 3.1 10*3/uL — ABNORMAL LOW (ref 4.0–10.5)
nRBC: 0 % (ref 0.0–0.2)

## 2023-06-20 LAB — CULTURE, BLOOD (ROUTINE X 2): Special Requests: ADEQUATE

## 2023-06-20 MED ORDER — HEPARIN SOD (PORK) LOCK FLUSH 100 UNIT/ML IV SOLN
500.0000 [IU] | Freq: Once | INTRAVENOUS | Status: AC
  Administered 2023-06-20: 500 [IU] via INTRAVENOUS
  Filled 2023-06-20: qty 5

## 2023-06-20 MED ORDER — AMOXICILLIN 500 MG PO CAPS: 500.0000 mg | ORAL_CAPSULE | Freq: Three times a day (TID) | ORAL | Status: DC

## 2023-06-20 MED ORDER — POTASSIUM CHLORIDE CRYS ER 20 MEQ PO TBCR
40.0000 meq | EXTENDED_RELEASE_TABLET | Freq: Once | ORAL | Status: AC
  Administered 2023-06-20: 40 meq via ORAL
  Filled 2023-06-20: qty 2

## 2023-06-20 NOTE — Care Management Important Message (Signed)
Important Message  Patient Details  Name: Kathy [name] MRN: [id number] Date of Birth: [birth year]   Important Message Given:  Yes - Medicare IM     Corey Harold 06/20/2023, 10:47 AM

## 2023-06-20 NOTE — [place] Notes (Signed)
Kathy [name] at [facility] was contacted by telephone to verify understanding of discharge instructions status post their most recent discharge from the hospital on the date:  06/20/23.  I Transportation to appointments were confirmed for the patient as being  Comptroller .  [name]'s questions were addressed to their satisfaction upon completion of this post discharge follow-up call for outpatient oncology.

## 2023-06-20 NOTE — TOC Transition Note (Addendum)
Transition of Care [facility]) - CM/SW Discharge Note   Patient Details  Name: Kathy [name] MRN: [id number] Date of Birth: 04/09/1941  Transition of [facility]) CM/SW Contact:  Elliot Gault, LCSW Phone Number: 06/20/2023, 11:01 AM   Clinical Narrative:     Pt medically stable for dc today per MD. Updated pt's brother who is in agreement with dc of pt back to her LTC SNF.  Updated Tiffany at [facility]. They can admit pt today. DC clinical sent electronically. RN to call report. EMS arranged.  No other TOC needs for dc.  Updated by Palliative APNP that pt interested in outpatient palliative services from [facility]. Referral made to [facility] who accepted and stated they will follow up with pt.  Final next level of care: Long Term Nursing Home Barriers to Discharge: Barriers Resolved   Patient Goals and CMS Choice CMS Medicare.gov Compare Post Acute Care list provided to:: Patient Choice offered to / list presented to : Patient  Discharge Placement                         Discharge Plan and Services Additional resources added to the After Visit Summary for   In-house Referral: Clinical Social Work Discharge Planning Services: CM Consult Post Acute Care Choice: Skilled Nursing Facility                               Social Determinants of Health (SDOH) Interventions SDOH Screenings   Food Insecurity: Patient Unable To Answer (05/12/2023)  Housing: High Risk (05/12/2023)  Transportation Needs: Patient Unable To Answer (05/12/2023)  Utilities: Patient Unable To Answer (05/12/2023)  Alcohol Screen: Low Risk  (09/14/2021)  Depression (PHQ2-9): Low Risk  (03/30/2023)  Financial Resource Strain: Low Risk  (09/14/2021)  Physical Activity: Sufficiently Active (09/14/2021)  Social Connections: Moderately Isolated (09/14/2021)  Stress: No Stress Concern Present (09/14/2021)  Tobacco Use: Low Risk  (06/20/2023)     Readmission Risk  Interventions    06/17/2023   11:10 AM 05/12/2023    1:07 PM 03/16/2023   10:32 AM  Readmission Risk Prevention Plan  Transportation Screening Complete Complete Complete  PCP or Specialist Appt within 3-5 Days   Complete  HRI or Home Care Consult   Complete  Social Work Consult for Recovery Care Planning/Counseling   Complete  Palliative Care Screening   Not Applicable  Medication Review [employer]) Complete  Complete  PCP or Specialist appointment within 3-5 days of discharge  Complete   HRI or Home Care Consult Complete Complete   SW Recovery Care/Counseling Consult Complete Complete   Palliative Care Screening Not Applicable Not Applicable   Skilled Nursing Facility Complete Complete

## 2023-06-20 NOTE — Consult Note (Signed)
Consultation Note Date: 06/20/2023   Patient Name: Kathy [name]  DOB: April 29, 1941  MRN: [id number]  Age / Sex: 82 y.o., female  PCP: [name], MD Referring Physician: Catarina Hartshorn, MD  Reason for Consultation: Establishing goals of care  HPI/Patient Profile: 82 y.o. female  with past medical history of myeloma, hypertension, GERD, hypothyroidism, OSA  admitted on 06/16/2023 with AKI.   Clinical Assessment and Goals of Care: I have reviewed medical records including EPIC notes, labs and imaging, received report from RN, assessed the patient.  Kathy [name] is sitting up in the [place] chair in her room.  Kathy [name] greets me, making and mostly keeping eye contact.  Kathy [name] appears chronically ill and somewhat frail.  Kathy [name] is alert and oriented, able to make her needs known.  There is no family at bedside at this time.  We meet at the bedside to discuss diagnosis prognosis, GOC, EOL wishes, disposition and options.  I introduced Palliative Medicine as specialized medical care for people living with serious illness. It focuses on providing relief from the symptoms and stress of a serious illness. The goal is to improve quality of life for both the patient and the family.  We focused on their current illness.  We talked about her acute hospital stay and the plan for discharge today back to short-term rehab.  Overall, Kathy [name] is knowledgeable about her health and the plan.  The natural disease trajectory and expectations at EOL were discussed.  Palliative Care services outpatient were explained and offered.   We talked about the benefits of outpatient palliative services for continued support and goals of care discussions.  Kathy [name] is agreeable.  Provider choice offered, Kathy [name] chooses [facility].  Discussed the importance of continued conversation with family and the medical providers regarding overall plan of care and treatment  options, ensuring decisions are within the context of the patient's values and GOCs.  Questions and concerns were addressed.   The family was encouraged to call with questions or concerns.  PMT will continue to support holistically.   HCPOA  HCPOA -brother Jonny Ruiz is healthcare power of attorney, brother Moise Boring is second.    SUMMARY OF RECOMMENDATIONS   At this point continue full scope/full code Return to [facility] in [place] for short-term rehab Ultimate goal is to return home Agreeable to outpatient palliative services with [facility].   Code Status/Advance Care Planning: Full code  Symptom Management:  Per hospitalist, no additional needs at this time.  Palliative Prophylaxis:  Frequent Pain Assessment and Oral Care  Additional Recommendations (Limitations, Scope, Preferences): Full Scope Treatment  Psycho-social/Spiritual:  Desire for further Chaplaincy support:no Additional Recommendations: Caregiving  Support/Resources  Prognosis:  Unable to determine, based on outcomes. 6 months or less would not be surprising.   Discharge Planning: Skilled Nursing Facility for rehab with Palliative care service follow-up      Primary Diagnoses: Present on Admission:  AKI (acute kidney injury) (HCC)  Thrombocytopenia (HCC)  RBBB (right bundle branch block with left anterior fascicular block)  Hypomagnesemia  I have reviewed the medical record, interviewed the patient and family, and examined the patient. The following aspects are pertinent.  Past Medical History:  Diagnosis Date   Abnormal findings on diagnostic imaging of heart and coronary circulation    Anemia    FROM BLEEDING ULCER   Anxiety    takes Alprazolam daily as needed   Arthritis    dx with RA 2017   Bariatric surgery status    Cataract    Diverticulosis    Gastro-esophageal reflux disease with esophagitis    Gastrojejunal ulcer with hemorrhage    Headache(784.0)    occasionally   History of blood  transfusion    no abnormal reaction noted   History of bronchitis    last time many yrs ago   Hyperlipidemia    PT DENIES THIS DX -  ON NO MEDS AND NO ONE HAS TOLD HER   Hypertension    takes Amlodipine daily   Hypothyroidism    takes Synthroid daily   Insomnia    Joint pain    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   Localized edema    Nocturia    Numbness    occasionally left arm at night   Obesity    Osteoporosis    takes Fosamax weekly   Pain in joint involving pelvic region and thigh    Peripheral edema    takes Lasix daily as needed   PONV (postoperative nausea and vomiting)    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/2017   Rheumatoid arthritis, unspecified (HCC)    Rotator cuff arthropathy, 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   Sleep apnea    Phreesia 08/22/2020   Stomach ulcer    Thyroid disease    Phreesia 08/22/2020   Unspecified injury of muscle(s) and tendon(s) of the rotator cuff of left shoulder, subsequent encounter    Unspecified osteoarthritis, unspecified site    Wears glasses    Wears partial dentures    Social History   Socioeconomic History   Marital status: Divorced    Spouse name: Not on file   Number of children: 0   Years of education: Not on file   Highest education level: Not on file  Occupational History   Occupation: disabled  Tobacco Use   Smoking status: Never    Passive exposure: Never   Smokeless tobacco: Never  Vaping Use   Vaping status: Never Used  Substance and Sexual Activity   Alcohol use: No   Drug use: No   Sexual activity: Not Currently    Birth control/protection: Surgical  Other Topics Concern   Not on file  Social History Narrative   Lives alone : "at [facility] for rehab right now."   Social Determinants of Health   Financial Resource Strain: Low Risk  (09/14/2021)   Overall Financial Resource Strain (CARDIA)    Difficulty of Paying Living Expenses: Not hard at all  Food Insecurity: Patient Unable To  Answer (05/12/2023)   Hunger Vital Sign    Worried About Programme researcher, broadcasting/film/video in the Last Year: Patient unable to answer    Ran Out of Food in the Last Year: Patient unable to answer  Transportation Needs: Patient Unable To Answer (05/12/2023)   PRAPARE - Transportation    Lack of Transportation (Medical): Patient unable to answer    Lack of Transportation (Non-Medical): Patient unable to answer  Physical Activity: Sufficiently Active (09/14/2021)   Exercise Vital Sign    Days of Exercise per Week: 6  days    Minutes of Exercise per Session: 40 min  Stress: No Stress Concern Present (09/14/2021)   [employer] of Occupational Health - Occupational Stress Questionnaire    Feeling of Stress : Not at all  Social Connections: Moderately Isolated (09/14/2021)   Social Connection and Isolation Panel [NHANES]    Frequency of Communication with Friends and Family: More than three times a week    Frequency of Social Gatherings with Friends and Family: More than three times a week    Attends Religious Services: More than 4 times per year    Active Member of [employer] or Organizations: No    Attends Engineer, structural: Never    Marital Status: Divorced   Family History  Problem Relation Age of Onset   Diabetes Mother    Congestive Heart Failure Mother    Lung cancer Father    Thrombosis Sister    CAD Brother        CABG   Colon cancer Neg Hx    Esophageal cancer Neg Hx    Stomach cancer Neg Hx    Rectal cancer Neg Hx    Scheduled Meds:  acyclovir  400 mg Oral BID   amoxicillin  500 mg Oral Q8H   calcium carbonate  1 tablet Oral TID WC   Chlorhexidine Gluconate Cloth  6 each Topical Daily   ferrous sulfate  325 mg Oral Daily   levothyroxine  68.5 mcg Oral Q0600   magnesium oxide  400 mg Oral Daily   midodrine  7.5 mg Oral TID WC   multivitamin with minerals  1 tablet Oral q morning   omega-3 acid ethyl esters  1 capsule Oral Daily   oxybutynin  10 mg Oral QHS    pantoprazole  80 mg Oral Daily   polyethylene glycol  17 g Oral Daily   Continuous Infusions: PRN Meds:.acetaminophen **OR** acetaminophen, ondansetron **OR** ondansetron (ZOFRAN) IV, mouth rinse, traMADol Medications Prior to Admission:  Prior to Admission medications   Medication Sig Start Date End Date Taking? Authorizing Provider  acetaminophen (TYLENOL) 500 MG tablet Take 1 tablet (500 mg total) by mouth every 6 (six) hours as needed for mild pain, headache or fever (or Fever >/= 101). 03/16/23  Yes Shon Hale, MD  acyclovir (ZOVIRAX) 400 MG tablet Take 1 tablet (400 mg total) by mouth 2 (two) times daily. 04/26/23  Yes Doreatha Massed, MD  Adalimumab (HUMIRA PEN) 40 MG/0.4ML PNKT Inject 40 mg into the skin every Saturday.   Yes [provider]  Aspirin 81 MG CAPS Take 1 capsule by mouth daily.   Yes [provider]  denosumab (PROLIA) 60 MG/ML SOSY injection 60mg  Subcutaneous every 6 months 180 days 04/06/23  Yes   ferrous sulfate 325 (65 FE) MG EC tablet Take 1 tablet (325 mg total) by mouth daily. 03/16/23  Yes Emokpae, Courage, MD  furosemide (LASIX) 20 MG tablet Take 1 tablet (20 mg total) by mouth 2 (two) times daily. 03/16/22  Yes [name], MD  LENALIDOMIDE PO Take 1 tablet by mouth daily. X21 days 06/09/23  Yes [provider]  levothyroxine (SYNTHROID) 137 MCG tablet Take 0.5 tablets (68.5 mcg total) by mouth daily at 6 (six) AM. 05/19/23  Yes Marrion Coy, MD  Multiple Vitamin (MULTIVITAMIN WITH MINERALS) TABS tablet Take 1 tablet by mouth every morning.   Yes [provider]  naloxone (NARCAN) nasal spray 4 mg/0.1 mL Place 1 spray into the nose as needed (opioid reversal).  Yes [provider]  Omega-3 Fatty Acids (FISH OIL) 1000 MG CAPS Take 1 capsule by mouth daily. 03/17/23  Yes [provider]  omeprazole (PRILOSEC) 40 MG capsule Take 1 capsule (40 mg total) by mouth daily before breakfast. Open capsule and  swallow granules with liquid or applesauce 04/15/22  Yes Iva Boop, MD  ondansetron (ZOFRAN) 4 MG tablet Take 4 mg by mouth every 6 (six) hours as needed for nausea or vomiting.   Yes [provider]  oxybutynin (DITROPAN-XL) 10 MG 24 hr tablet Take 10 mg by mouth at bedtime. 03/16/23  Yes [provider]  polyethylene glycol (MIRALAX / GLYCOLAX) 17 g packet Take 17 g by mouth daily. 04/13/23  Yes Shah, Pratik D, DO  potassium chloride SA (KLOR-CON M) 20 MEQ tablet Take 1 tablet (20 mEq total) by mouth daily. TAKE 1 TABLET (20 MEQ) BY MOUTH DAILY PRN with Lasix (EDEMA) Patient taking differently: Take 20 mEq by mouth daily. 03/16/23  Yes Emokpae, Courage, MD  traMADol (ULTRAM) 50 MG tablet Take 1 tablet (50 mg total) by mouth every 8 (eight) hours as needed. 05/18/23  Yes Marrion Coy, MD  amoxicillin (AMOXIL) 500 MG capsule Take 1 capsule (500 mg total) by mouth every 8 (eight) hours. X 8 days 06/20/23   Catarina Hartshorn, MD  calcium carbonate (TUMS - DOSED IN MG ELEMENTAL CALCIUM) 500 MG chewable tablet Chew 1 tablet (200 mg of elemental calcium total) by mouth 3 (three) times daily with meals. 06/19/23   Catarina Hartshorn, MD  magnesium oxide (MAG-OX) 400 (240 Mg) MG tablet Take 1 tablet (400 mg total) by mouth daily. 06/20/23   Catarina Hartshorn, MD  midodrine (PROAMATINE) 2.5 MG tablet Take 3 tablets (7.5 mg total) by mouth 3 (three) times daily with meals. 06/19/23   Catarina Hartshorn, MD  traMADol (ULTRAM) 50 MG tablet Take 1 tablet (50 mg total) by mouth every 8 (eight) hours as needed for moderate pain (pain score 4-6). 06/19/23   Catarina Hartshorn, MD   Allergies  Allergen Reactions   Macrobid [Nitrofurantoin] Other (See Comments)    Stomach pain   Review of Systems  Unable to perform ROS: Age    Physical Exam Vitals and nursing note reviewed.     Vital Signs: BP 102/64 (BP Location: Right Arm)   Pulse 79   Temp 97.9 F (36.6 C)   Resp 16   Ht 5\' 1"  (1.549 m)   Wt 59.4 kg   SpO2 100%    BMI 24.75 kg/m  Pain Scale: 0-10   Pain Score: 0-No pain   SpO2: SpO2: 100 % O2 Device:SpO2: 100 % O2 Flow Rate: .   IO: Intake/output summary:  Intake/Output Summary (Last 24 hours) at 06/20/2023 0834 Last data filed at 06/20/2023 0500 Gross per 24 hour  Intake 1426.53 ml  Output 1700 ml  Net -273.47 ml    LBM: Last BM Date : 06/18/23 Baseline Weight: Weight: 59.4 kg Most recent weight: Weight: 59.4 kg     Palliative Assessment/Data:     Time In: 0950 Time Out: 1030 Time Total: 40 minutes  Greater than 50%  of this time was spent counseling and coordinating care related to the above assessment and plan.  Signed by: Katheran Awe, NP   Please contact Palliative Medicine Team phone at [phone] for questions and concerns.  For individual provider: See Loretha Stapler

## 2023-06-20 NOTE — Plan of Care (Signed)

## 2023-06-20 NOTE — Progress Notes (Signed)
Mobility Specialist Progress Note:    06/20/23 1130  Mobility  Activity Transferred from bed to chair  Level of Assistance Moderate assist, patient does 50-74%  Assistive Device Front wheel walker  Distance Ambulated (ft) 4 ft  Range of Motion/Exercises Active;All extremities  Activity Response Tolerated well  Mobility Referral Yes  $Mobility charge 1 Mobility  Mobility Specialist Start Time (ACUTE ONLY) 1055  Mobility Specialist Stop Time (ACUTE ONLY) 1115  Mobility Specialist Time Calculation (min) (ACUTE ONLY) 20 min   Pt received in bed, agreeable to mobility. Required ModA to stand and transfer with RW. Tolerated well, pt requested 2 people assist for anxiety of falling. Left pt in chair, alarm on. Call bell in reach, all needs met.   Lawerance Bach Mobility Specialist Please contact via Special educational needs teacher or  Rehab office at [phone]

## 2023-06-20 NOTE — [place] FL2 (Signed)
[place] MEDICAID FL2 LEVEL OF CARE FORM     IDENTIFICATION  Patient Name: Sylvette Morace Chevere Birthdate: 02-05-1941 Sex: female Admission Date (Current Location): 06/16/2023  [facility] and [place] Number:  [employer] and Address:  [facility],  [address], [place] [address]      Provider Number: [id number]  Attending Physician Name and Address:  Catarina Hartshorn, MD  Relative Name and Phone Number:       Current Level of Care: Hospital Recommended Level of Care: Skilled Nursing Facility Prior Approval Number:    Date Approved/Denied:   PASRR Number:    Discharge Plan: SNF    Current Diagnoses: Patient Active Problem List   Diagnosis Date Noted   Gram-negative bacteremia 06/17/2023   Hypocalcemia 06/16/2023   Hypernatremia 05/18/2023   Protein-calorie malnutrition, severe 05/17/2023   Sepsis secondary to UTI (HCC) 05/12/2023   Electrolyte abnormality 05/12/2023   Thrombocytopenia (HCC) 05/12/2023   AMS (altered mental status) 05/12/2023   Multiple myeloma (HCC) 04/14/2023   Hypercalcemia of malignancy 04/07/2023   General weakness 04/07/2023   Recurrent falls 04/07/2023   Hypotension 04/07/2023   Hypoalbuminemia due to protein-calorie malnutrition (HCC) 04/07/2023   Acute prostatitis 04/07/2023   Chronic kidney disease, stage 3b (HCC) 04/07/2023   Dysphagia 04/07/2023   Acute pancreatitis 04/07/2023   History of thyroid cancer 04/07/2023   Anemia 03/30/2023   Physical deconditioning 03/30/2023   Elevated serum immunoglobulin free light chain level 03/30/2023   Leg swelling 02/21/2023   Hospital discharge follow-up 02/21/2023   Hypomagnesemia 02/21/2023   Urge incontinence of urine 02/21/2023   Hypercalcemia 01/23/2023   AKI (acute kidney injury) (HCC) 01/23/2023   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   Hammer toe of left foot 09/15/2022   Left foot pain 09/15/2022   Iron deficiency anemia due to chronic blood  loss 07/19/2022   Rectal bleeding 01/21/2022   Gastrojejunal ulcer with hemorrhage    Encounter for general adult medical examination with abnormal findings 09/16/2021   Hypokalemia 09/16/2021   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   Bilateral impacted cerumen 11/19/2020   Hearing loss 11/19/2020   Malignant tumor of thyroid gland (HCC) 10/08/2020   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   Polyarthritis    Bariatric surgery status    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/2017   Rheumatoid arthritis (HCC) 11/16/2016   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   Rotator cuff arthropathy, 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   S/P shoulder replacement 05/11/2016   Allergic rhinitis 10/19/2013   GERD (gastroesophageal reflux disease) 10/19/2013   Osteoporosis    Essential hypertension    Acquired hypothyroidism    Hyperlipidemia    Anxiety    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   Unspecified protein-calorie malnutrition (HCC) 10/14/2011   Vitamin deficiency 10/14/2011   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    Orientation RESPIRATION BLADDER Height & Weight     Self, Time, Situation, Place  Normal Incontinent Weight: 131 lb (59.4 kg) Height:  5\' 1"  (154.9 cm)  BEHAVIORAL SYMPTOMS/MOOD NEUROLOGICAL BOWEL NUTRITION STATUS      Incontinent Diet (dysphagia 3)  AMBULATORY STATUS COMMUNICATION OF NEEDS Skin   Extensive Assist Verbally Normal                       Personal Care Assistance Level of Assistance  Bathing, Feeding, Dressing Bathing Assistance: Limited assistance Feeding assistance: Limited assistance Dressing Assistance:  Limited assistance     Functional Limitations Info  Sight, Hearing, Speech Sight Info: Adequate Hearing Info: Adequate Speech Info: Adequate    SPECIAL CARE FACTORS FREQUENCY                       Contractures Contractures Info: Not present    Additional Factors  Info  Code Status, Allergies Code Status Info: Full Allergies Info: Macrobid           Current Medications (06/20/2023):  This is the current hospital active medication list Current Facility-Administered Medications  Medication Dose Route Frequency Provider Last Rate Last Admin   acetaminophen (TYLENOL) tablet 650 mg  650 mg Oral Q6H PRN Tat, David, MD   650 mg at 06/16/23 1704   Or   acetaminophen (TYLENOL) suppository 650 mg  650 mg Rectal Q6H PRN Tat, Onalee Hua, MD       acyclovir (ZOVIRAX) tablet 400 mg  400 mg Oral BID Tat, David, MD   400 mg at 06/20/23 0935   amoxicillin (AMOXIL) capsule 500 mg  500 mg Oral Andres Labrum, MD   500 mg at 06/20/23 0534   calcium carbonate (TUMS - dosed in mg elemental calcium) chewable tablet 200 mg of elemental calcium  1 tablet Oral TID WC Tat, Onalee Hua, MD   200 mg of elemental calcium at 06/20/23 0931   Chlorhexidine Gluconate Cloth 2 % PADS 6 each  6 each Topical Daily Tat, David, MD   6 each at 06/20/23 0940   ferrous sulfate tablet 325 mg  325 mg Oral Daily Tat, David, MD   325 mg at 06/20/23 0930   levothyroxine (SYNTHROID) tablet 68.5 mcg  68.5 mcg Oral Q0600 Catarina Hartshorn, MD   68.5 mcg at 06/20/23 0534   magnesium oxide (MAG-OX) tablet 400 mg  400 mg Oral Daily Tat, David, MD   400 mg at 06/20/23 0936   midodrine (PROAMATINE) tablet 7.5 mg  7.5 mg Oral TID WC Catarina Hartshorn, MD   7.5 mg at 06/20/23 1610   multivitamin with minerals tablet 1 tablet  1 tablet Oral q morning Tat, David, MD   1 tablet at 06/20/23 0932   omega-3 acid ethyl esters (LOVAZA) capsule 1 g  1 capsule Oral Daily Tat, David, MD   1 g at 06/20/23 0941   ondansetron (ZOFRAN) tablet 4 mg  4 mg Oral Q6H PRN Tat, Onalee Hua, MD       Or   ondansetron (ZOFRAN) injection 4 mg  4 mg Intravenous Q6H PRN Tat, Onalee Hua, MD       Oral care mouth rinse  15 mL Mouth Rinse PRN Tat, Onalee Hua, MD       oxybutynin (DITROPAN-XL) 24 hr tablet 10 mg  10 mg Oral Maura Crandall, MD   10 mg at 06/19/23 2007    pantoprazole (PROTONIX) EC tablet 80 mg  80 mg Oral Daily Tat, Onalee Hua, MD   80 mg at 06/20/23 0931   polyethylene glycol (MIRALAX / GLYCOLAX) packet 17 g  17 g Oral Daily Tat, David, MD   17 g at 06/20/23 0940   potassium chloride SA (KLOR-CON M) CR tablet 40 mEq  40 mEq Oral Once Tat, David, MD       traMADol Janean Sark) tablet 50 mg  50 mg Oral Q8H PRN Catarina Hartshorn, MD   50 mg at 06/18/23 1321     Discharge Medications: Please see discharge summary for a list of discharge medications.  Relevant Imaging  Results:  Relevant Lab Results:   Additional Information    Elliot Gault, LCSW

## 2023-06-20 NOTE — Plan of Care (Signed)

## 2023-06-20 NOTE — Progress Notes (Signed)
Called and gave report to [place] at [facility].

## 2023-06-21 DIAGNOSIS — I10 Essential (primary) hypertension: Secondary | ICD-10-CM | POA: Diagnosis not present

## 2023-06-21 LAB — TYPE AND SCREEN
ABO/RH(D): AB POS
Antibody Screen: POSITIVE
DAT, IgG: NEGATIVE
Unit division: 0
Unit division: 0

## 2023-06-21 LAB — BPAM RBC
Blood Product Expiration Date: 202412092359
ISSUE DATE / TIME: 202411151524
Unit Type and Rh: 8400
Unit Type and Rh: 8400

## 2023-06-21 LAB — CULTURE, BLOOD (ROUTINE X 2)

## 2023-06-22 ENCOUNTER — Inpatient Hospital Stay: Payer: 59

## 2023-06-22 VITALS — BP 97/66 | HR 79 | Temp 98.5°F | Resp 18

## 2023-06-22 DIAGNOSIS — Z95828 Presence of other vascular implants and grafts: Secondary | ICD-10-CM

## 2023-06-22 DIAGNOSIS — C9000 Multiple myeloma not having achieved remission: Secondary | ICD-10-CM | POA: Diagnosis not present

## 2023-06-22 DIAGNOSIS — D630 Anemia in neoplastic disease: Secondary | ICD-10-CM | POA: Diagnosis not present

## 2023-06-22 DIAGNOSIS — Z5112 Encounter for antineoplastic immunotherapy: Secondary | ICD-10-CM | POA: Diagnosis not present

## 2023-06-22 DIAGNOSIS — K219 Gastro-esophageal reflux disease without esophagitis: Secondary | ICD-10-CM | POA: Diagnosis not present

## 2023-06-22 DIAGNOSIS — M545 Low back pain, unspecified: Secondary | ICD-10-CM | POA: Diagnosis not present

## 2023-06-22 DIAGNOSIS — E876 Hypokalemia: Secondary | ICD-10-CM | POA: Diagnosis not present

## 2023-06-22 DIAGNOSIS — E039 Hypothyroidism, unspecified: Secondary | ICD-10-CM | POA: Diagnosis not present

## 2023-06-22 DIAGNOSIS — N179 Acute kidney failure, unspecified: Secondary | ICD-10-CM | POA: Diagnosis not present

## 2023-06-22 DIAGNOSIS — R1310 Dysphagia, unspecified: Secondary | ICD-10-CM | POA: Diagnosis not present

## 2023-06-22 DIAGNOSIS — I9589 Other hypotension: Secondary | ICD-10-CM | POA: Diagnosis not present

## 2023-06-22 DIAGNOSIS — M069 Rheumatoid arthritis, unspecified: Secondary | ICD-10-CM | POA: Diagnosis not present

## 2023-06-22 LAB — CBC WITH DIFFERENTIAL/PLATELET
Abs Immature Granulocytes: 0.01 10*3/uL (ref 0.00–0.07)
Basophils Absolute: 0 10*3/uL (ref 0.0–0.1)
Basophils Relative: 1 %
Eosinophils Absolute: 0.1 10*3/uL (ref 0.0–0.5)
Eosinophils Relative: 3 %
HCT: 23.7 % — ABNORMAL LOW (ref 36.0–46.0)
Hemoglobin: 8 g/dL — ABNORMAL LOW (ref 12.0–15.0)
Immature Granulocytes: 1 %
Lymphocytes Relative: 66 %
Lymphs Abs: 1.4 10*3/uL (ref 0.7–4.0)
MCH: 33.2 pg (ref 26.0–34.0)
MCHC: 33.8 g/dL (ref 30.0–36.0)
MCV: 98.3 fL (ref 80.0–100.0)
Monocytes Absolute: 0.2 10*3/uL (ref 0.1–1.0)
Monocytes Relative: 7 %
Neutro Abs: 0.5 10*3/uL — ABNORMAL LOW (ref 1.7–7.7)
Neutrophils Relative %: 22 %
Platelets: 100 10*3/uL — ABNORMAL LOW (ref 150–400)
RBC: 2.41 MIL/uL — ABNORMAL LOW (ref 3.87–5.11)
RDW: 18.3 % — ABNORMAL HIGH (ref 11.5–15.5)
WBC: 2.1 10*3/uL — ABNORMAL LOW (ref 4.0–10.5)
nRBC: 0 % (ref 0.0–0.2)

## 2023-06-22 LAB — COMPREHENSIVE METABOLIC PANEL
ALT: 13 U/L (ref 0–44)
AST: 11 U/L — ABNORMAL LOW (ref 15–41)
Albumin: 1.7 g/dL — ABNORMAL LOW (ref 3.5–5.0)
Alkaline Phosphatase: 161 U/L — ABNORMAL HIGH (ref 38–126)
Anion gap: 8 (ref 5–15)
BUN: 14 mg/dL (ref 8–23)
CO2: 19 mmol/L — ABNORMAL LOW (ref 22–32)
Calcium: 7.1 mg/dL — ABNORMAL LOW (ref 8.9–10.3)
Chloride: 112 mmol/L — ABNORMAL HIGH (ref 98–111)
Creatinine, Ser: 1.08 mg/dL — ABNORMAL HIGH (ref 0.44–1.00)
GFR, Estimated: 52 mL/min — ABNORMAL LOW (ref >60)
Glucose, Bld: 85 mg/dL (ref 70–99)
Potassium: 3.7 mmol/L (ref 3.5–5.1)
Sodium: 139 mmol/L (ref 135–145)
Total Bilirubin: 0.4 mg/dL (ref <1.2)
Total Protein: 6.3 g/dL — ABNORMAL LOW (ref 6.5–8.1)

## 2023-06-22 LAB — MAGNESIUM: Magnesium: 2 mg/dL (ref 1.7–2.4)

## 2023-06-22 MED ORDER — SODIUM CHLORIDE 0.9% FLUSH
10.0000 mL | Freq: Once | INTRAVENOUS | Status: AC
  Administered 2023-06-22: 10 mL via INTRAVENOUS

## 2023-06-22 MED ORDER — HEPARIN SOD (PORK) LOCK FLUSH 100 UNIT/ML IV SOLN
500.0000 [IU] | Freq: Once | INTRAVENOUS | Status: AC
  Administered 2023-06-22: 500 [IU] via INTRAVENOUS

## 2023-06-22 NOTE — Progress Notes (Signed)
CRITICAL VALUE STICKER  CRITICAL VALUE: ANC 0.5  RECEIVER (on-site recipient of call): Fleet Contras RN  DATE & TIME NOTIFIED: 1400  MD NOTIFIED: Dr. Anders Simmonds  RESPONSE: Hold treatment this week, follow neutropenic precautions, return next week for labs and potential treatment   Port flushed with good blood return noted. No bruising or swelling at site. Bandaid applied and patient discharged in satisfactory condition. VVS stable with no signs or symptoms of distressed noted.

## 2023-06-22 NOTE — Patient Instructions (Signed)
[facility]  Discharge Instructions: Thank you for choosing [facility] to provide your oncology and hematology care.  If you have a lab appointment with the Cancer Center - please note that after April 8th, 2024, all labs will be drawn in the cancer center.  You do not have to check in or register with the main entrance as you have in the past but will complete your check-in in the cancer center.  Wear comfortable clothing and clothing appropriate for easy access to any Portacath or PICC line.   We strive to give you quality time with your provider. You may need to reschedule your appointment if you arrive late (15 or more minutes).  Arriving late affects you and other patients whose appointments are after yours.  Also, if you miss three or more appointments without notifying the office, you may be dismissed from the clinic at the provider's discretion.      For prescription refill requests, have your pharmacy contact our office and allow 72 hours for refills to be completed.    Today your treatment was held due to low White blood count and ANC. Use Neutropenic precautions, return next week for possible treatment.  Neutropenic Fever Neutropenic fever is a type of fever that can develop when you have very low levels of neutrophils. Neutrophils are a type of white blood cells. They are made in the spongy tissue inside the bones (bone marrow). They help the body fight infection. This condition is called neutropenia. When you have neutropenia, you could be in danger of a severe infection and neutropenic fever. Neutropenic fever can be a medical emergency and must be treated right away with antibiotic medicines. What are the causes? This condition is caused by severe neutropenia. This can occur if the amount of neutrophils in your blood is very low, putting you at risk of serious infections. What increases the risk? The following  factors may make you more likely to develop this condition: Cancer. Cancer treatments, such as radiation or chemotherapy. Viral infections. Medicines, such as phenytoin. Vitamin B12 deficiency. Diseases of the bone marrow. Environmental toxins, such as insecticides. What are the signs or symptoms? The main symptom of this condition is fever. If other symptoms are present, they are usually caused by an underlying infection. Symptoms of an infection may include: Chills. Cough. Swollen glands. Mouth ulcers. Rash or skin infection. Skin may be red, swollen, or painful. Abdominal or rectal pain. Frequent urination or pain or burning with urination. How is this diagnosed? This condition may be diagnosed based on the results of: Blood tests. These may include: A complete blood count (CBC) and a differential white blood count (WBC). Peripheral smear. This test involves checking a blood sample under a microscope. Cultures. These are tests to check whether germs will grow in samples of blood, urine, or other body fluids. These may be done to check for a source of infection. Chest X-rays. How is this treated? Treatment for this condition may be started as soon as you get diagnosed with neutropenic fever, even if your health care provider is still looking for the source of infection. You may have to stop taking any medicine that could be causing neutropenic fever. If you have high-risk neutropenic fever, you will receive one or more antibiotics through an IV in the hospital. If you have low-risk neutropenic fever, you may be treated at home. Treatment may involve: Taking antibiotics by mouth (  orally). Receiving IV antibiotics that are given by a health care provider who visits your home. If your health care provider finds a specific cause of infection, you may be switched to antibiotics that work best against those bacteria. If a fungal infection is found, your medicine will be changed to an  antifungal medicine. If your fever goes away in 3-5 days, you may have to take medicine for about 7 days. If your fever does not go away after 3-5 days, you may have to take medicine for a longer period. If your fever is caused by cancer medicines (chemotherapy), you may need to take a certain medicine (white blood cell growth factors) that helps prevent fever. Follow these instructions at home:  Take over-the-counter and prescription medicines only as told by your health care provider. Take your antibiotic medicine as told by your health care provider. Do not stop taking the antibiotic even if you start to feel better. Drink enough fluid to keep your urine pale yellow. This helps to prevent dehydration. Wash your hands often with soap and water for at least 20 seconds. Make sure others who come in contact with you also wash their hands. If soap and water are not available, use hand sanitizer. Take steps to reduce your risk of injury or infection. Follow any precautions as told by your health care provider. Keep all follow-up visits. This is important. If you are being treated with oral antibiotics at home, you may need to return to your health care provider every day to have your CBC checked. You may have to do this until your fever gets better. Contact a health care provider if: You have chills. You have a fever. You have signs or symptoms of an infection. Get help right away if: You have trouble breathing. You have chest pain. You feel faint or dizzy. You faint. You become confused or disoriented. You have an infection that is not getting better or is getting worse. These symptoms may represent a serious problem that is an emergency. Do not wait to see if the symptoms will go away. Get medical help right away. Call your local emergency services (911 in the [place] Do not drive yourself to the hospital. Summary Neutropenic fever is a type of fever that can develop when you have a very low  number of white blood cells called neutrophils. Neutropenic fever must be treated right away with antibiotic medicines. Causes of this condition include cancers of the blood and bone marrow, as well as medicines to treat cancer (chemotherapy). Take steps to reduce your risk of injury or infection. Follow any precautions as told by your health care provider. This information is not intended to replace advice given to you by your health care provider. Make sure you discuss any questions you have with your health care provider. Document Revised: 01/14/2021 Document Reviewed: 01/14/2021 Elsevier Patient Education  2024 [employer] Inc.     To help prevent nausea and vomiting after your treatment, we encourage you to take your nausea medication as directed.  BELOW ARE SYMPTOMS THAT SHOULD BE REPORTED IMMEDIATELY: *FEVER GREATER THAN 100.4 F (38 C) OR HIGHER *CHILLS OR SWEATING *NAUSEA AND VOMITING THAT IS NOT CONTROLLED WITH YOUR NAUSEA MEDICATION *UNUSUAL SHORTNESS OF BREATH *UNUSUAL BRUISING OR BLEEDING *URINARY PROBLEMS (pain or burning when urinating, or frequent urination) *BOWEL PROBLEMS (unusual diarrhea, constipation, pain near the anus) TENDERNESS IN MOUTH AND THROAT WITH OR WITHOUT PRESENCE OF ULCERS (sore throat, sores in mouth, or a toothache) UNUSUAL RASH, SWELLING  OR PAIN  UNUSUAL VAGINAL DISCHARGE OR ITCHING   Items with * indicate a potential emergency and should be followed up as soon as possible or go to the Emergency Department if any problems should occur.  Please show the CHEMOTHERAPY ALERT CARD or IMMUNOTHERAPY ALERT CARD at check-in to the Emergency Department and triage nurse.  Should you have questions after your visit or need to cancel or reschedule your appointment, please contact [facility] - A DEPT OF Eligha Bridegroom [facility] [phone]  and follow the prompts.  Office hours are 8:00 a.m. to 4:30 p.m. Monday - Friday. Please note that  voicemails left after 4:00 p.m. may not be returned until the following business day.  We are closed weekends and major holidays. You have access to a nurse at all times for urgent questions. Please call the main number to the clinic [phone] and follow the prompts.  For any non-urgent questions, you may also contact your provider using MyChart. We now offer e-Visits for anyone 57 and older to request care online for non-urgent symptoms. For details visit [email or web address].   Also download the MyChart app! Go to the app store, search "MyChart", open the app, select [place], and log in with your MyChart username and password.

## 2023-06-23 DIAGNOSIS — E785 Hyperlipidemia, unspecified: Secondary | ICD-10-CM | POA: Diagnosis not present

## 2023-06-23 DIAGNOSIS — N179 Acute kidney failure, unspecified: Secondary | ICD-10-CM | POA: Diagnosis not present

## 2023-06-23 DIAGNOSIS — D649 Anemia, unspecified: Secondary | ICD-10-CM | POA: Diagnosis not present

## 2023-06-23 DIAGNOSIS — C9000 Multiple myeloma not having achieved remission: Secondary | ICD-10-CM | POA: Diagnosis not present

## 2023-06-24 ENCOUNTER — Encounter: Payer: Self-pay | Admitting: Hematology

## 2023-06-28 DIAGNOSIS — I9589 Other hypotension: Secondary | ICD-10-CM | POA: Diagnosis not present

## 2023-06-28 DIAGNOSIS — K219 Gastro-esophageal reflux disease without esophagitis: Secondary | ICD-10-CM | POA: Diagnosis not present

## 2023-06-28 DIAGNOSIS — C9000 Multiple myeloma not having achieved remission: Secondary | ICD-10-CM | POA: Diagnosis not present

## 2023-06-28 DIAGNOSIS — M069 Rheumatoid arthritis, unspecified: Secondary | ICD-10-CM | POA: Diagnosis not present

## 2023-06-28 DIAGNOSIS — E039 Hypothyroidism, unspecified: Secondary | ICD-10-CM | POA: Diagnosis not present

## 2023-06-28 DIAGNOSIS — M545 Low back pain, unspecified: Secondary | ICD-10-CM | POA: Diagnosis not present

## 2023-06-28 DIAGNOSIS — N179 Acute kidney failure, unspecified: Secondary | ICD-10-CM | POA: Diagnosis not present

## 2023-06-28 DIAGNOSIS — R1310 Dysphagia, unspecified: Secondary | ICD-10-CM | POA: Diagnosis not present

## 2023-06-28 DIAGNOSIS — E876 Hypokalemia: Secondary | ICD-10-CM | POA: Diagnosis not present

## 2023-06-28 DIAGNOSIS — D630 Anemia in neoplastic disease: Secondary | ICD-10-CM | POA: Diagnosis not present

## 2023-06-29 ENCOUNTER — Inpatient Hospital Stay: Payer: 59

## 2023-06-29 VITALS — BP 94/60

## 2023-06-29 DIAGNOSIS — D649 Anemia, unspecified: Secondary | ICD-10-CM

## 2023-06-29 DIAGNOSIS — C9000 Multiple myeloma not having achieved remission: Secondary | ICD-10-CM | POA: Diagnosis not present

## 2023-06-29 DIAGNOSIS — Z5112 Encounter for antineoplastic immunotherapy: Secondary | ICD-10-CM | POA: Diagnosis not present

## 2023-06-29 LAB — CBC WITH DIFFERENTIAL/PLATELET
Abs Immature Granulocytes: 0 10*3/uL (ref 0.00–0.07)
Band Neutrophils: 9 %
Basophils Absolute: 0 10*3/uL (ref 0.0–0.1)
Basophils Relative: 0 %
Eosinophils Absolute: 0.3 10*3/uL (ref 0.0–0.5)
Eosinophils Relative: 11 %
HCT: 21.9 % — ABNORMAL LOW (ref 36.0–46.0)
Hemoglobin: 7.1 g/dL — ABNORMAL LOW (ref 12.0–15.0)
Lymphocytes Relative: 61 %
Lymphs Abs: 1.6 10*3/uL (ref 0.7–4.0)
MCH: 32.6 pg (ref 26.0–34.0)
MCHC: 32.4 g/dL (ref 30.0–36.0)
MCV: 100.5 fL — ABNORMAL HIGH (ref 80.0–100.0)
Monocytes Absolute: 0.3 10*3/uL (ref 0.1–1.0)
Monocytes Relative: 11 %
Neutro Abs: 0.5 10*3/uL — ABNORMAL LOW (ref 1.7–7.7)
Neutrophils Relative %: 8 %
Platelets: 159 10*3/uL (ref 150–400)
RBC: 2.18 MIL/uL — ABNORMAL LOW (ref 3.87–5.11)
RDW: 19 % — ABNORMAL HIGH (ref 11.5–15.5)
WBC: 2.7 10*3/uL — ABNORMAL LOW (ref 4.0–10.5)
nRBC: 0 % (ref 0.0–0.2)

## 2023-06-29 LAB — COMPREHENSIVE METABOLIC PANEL
ALT: 9 U/L (ref 0–44)
AST: 11 U/L — ABNORMAL LOW (ref 15–41)
Albumin: 1.7 g/dL — ABNORMAL LOW (ref 3.5–5.0)
Alkaline Phosphatase: 240 U/L — ABNORMAL HIGH (ref 38–126)
Anion gap: 6 (ref 5–15)
BUN: 9 mg/dL (ref 8–23)
CO2: 22 mmol/L (ref 22–32)
Calcium: 6.5 mg/dL — ABNORMAL LOW (ref 8.9–10.3)
Chloride: 104 mmol/L (ref 98–111)
Creatinine, Ser: 0.98 mg/dL (ref 0.44–1.00)
GFR, Estimated: 58 mL/min — ABNORMAL LOW (ref >60)
Glucose, Bld: 84 mg/dL (ref 70–99)
Potassium: 3.4 mmol/L — ABNORMAL LOW (ref 3.5–5.1)
Sodium: 132 mmol/L — ABNORMAL LOW (ref 135–145)
Total Bilirubin: 0.6 mg/dL (ref <1.2)
Total Protein: 5.8 g/dL — ABNORMAL LOW (ref 6.5–8.1)

## 2023-06-29 LAB — MAGNESIUM: Magnesium: 1.8 mg/dL (ref 1.7–2.4)

## 2023-06-29 LAB — TYPE AND SCREEN
ABO/RH(D): AB POS
Antibody Screen: POSITIVE

## 2023-06-29 MED ORDER — DARATUMUMAB-HYALURONIDASE-FIHJ 1800-30000 MG-UT/15ML ~~LOC~~ SOLN: 1800.0000 mg | Freq: Once | SUBCUTANEOUS | Status: DC

## 2023-06-29 MED ORDER — DIPHENHYDRAMINE HCL 25 MG PO CAPS
25.0000 mg | ORAL_CAPSULE | Freq: Once | ORAL | Status: AC
  Administered 2023-06-29: 25 mg via ORAL
  Filled 2023-06-29: qty 1

## 2023-06-29 MED ORDER — ACETAMINOPHEN 325 MG PO TABS
650.0000 mg | ORAL_TABLET | Freq: Once | ORAL | Status: AC
  Administered 2023-06-29: 650 mg via ORAL
  Filled 2023-06-29: qty 2

## 2023-06-29 MED ORDER — SODIUM CHLORIDE 0.9% FLUSH
10.0000 mL | INTRAVENOUS | Status: DC | PRN
  Administered 2023-06-29: 10 mL via INTRAVENOUS

## 2023-06-29 MED ORDER — HEPARIN SOD (PORK) LOCK FLUSH 100 UNIT/ML IV SOLN
500.0000 [IU] | Freq: Every day | INTRAVENOUS | Status: AC | PRN
Start: 2023-06-29 — End: 2023-06-29
  Administered 2023-06-29: 500 [IU]

## 2023-06-29 MED ORDER — SODIUM CHLORIDE 0.9% FLUSH
10.0000 mL | INTRAVENOUS | Status: AC | PRN
  Administered 2023-06-29: 10 mL

## 2023-06-29 MED ORDER — DEXAMETHASONE 4 MG PO TABS: 20.0000 mg | ORAL_TABLET | Freq: Once | ORAL | Status: DC

## 2023-06-29 MED ORDER — SODIUM CHLORIDE 0.9% IV SOLUTION
250.0000 mL | INTRAVENOUS | Status: DC
  Administered 2023-06-29: 100 mL via INTRAVENOUS

## 2023-06-29 NOTE — Progress Notes (Signed)
Patient presents today for Daratumumab injection per providers order.  Hgb noted to be 7.1, patient will receive 1 UPRBC today.  ANC pending.  Patient has no new complaints at this time.  ANC 0.5 MD notified and [facility].  Patients blood is positive for antibodies, patient is asymptomatic, MD notified.  Message from Chapman Moss RN/Dr. Anders Simmonds to discharge patient and bring her in next week to recheck Hgb.  Discharge from clinic via wheelchair in stable condition.  Alert and oriented X 3.  Follow up with [facility] as scheduled.

## 2023-06-29 NOTE — Patient Instructions (Signed)
[facility] [facility]  Discharge Instructions: Thank you for choosing [facility] to provide your oncology and hematology care.  If you have a lab appointment with the Cancer Center - please note that after April 8th, 2024, all labs will be drawn in the cancer center.  You do not have to check in or register with the main entrance as you have in the past but will complete your check-in in the cancer center.  Wear comfortable clothing and clothing appropriate for easy access to any Portacath or PICC line.   We strive to give you quality time with your provider. You may need to reschedule your appointment if you arrive late (15 or more minutes).  Arriving late affects you and other patients whose appointments are after yours.  Also, if you miss three or more appointments without notifying the office, you may be dismissed from the clinic at the provider's discretion.      For prescription refill requests, have your pharmacy contact our office and allow 72 hours for refills to be completed.    Today you received the following chemotherapy and/or immunotherapy agents treatment held today       To help prevent nausea and vomiting after your treatment, we encourage you to take your nausea medication as directed.  BELOW ARE SYMPTOMS THAT SHOULD BE REPORTED IMMEDIATELY: *FEVER GREATER THAN 100.4 F (38 C) OR HIGHER *CHILLS OR SWEATING *NAUSEA AND VOMITING THAT IS NOT CONTROLLED WITH YOUR NAUSEA MEDICATION *UNUSUAL SHORTNESS OF BREATH *UNUSUAL BRUISING OR BLEEDING *URINARY PROBLEMS (pain or burning when urinating, or frequent urination) *BOWEL PROBLEMS (unusual diarrhea, constipation, pain near the anus) TENDERNESS IN MOUTH AND THROAT WITH OR WITHOUT PRESENCE OF ULCERS (sore throat, sores in mouth, or a toothache) UNUSUAL RASH, SWELLING OR PAIN  UNUSUAL VAGINAL DISCHARGE OR ITCHING   Items with * indicate a potential emergency and should  be followed up as soon as possible or go to the Emergency Department if any problems should occur.  Please show the CHEMOTHERAPY ALERT CARD or IMMUNOTHERAPY ALERT CARD at check-in to the Emergency Department and triage nurse.  Should you have questions after your visit or need to cancel or reschedule your appointment, please contact [facility] - A DEPT OF [facility] Bridegroom [facility] [phone]  and follow the prompts.  Office hours are 8:00 a.m. to 4:30 p.m. Monday - Friday. Please note that voicemails left after 4:00 p.m. may not be returned until the following business day.  We are closed weekends and major holidays. You have access to a nurse at all times for urgent questions. Please call the main number to the clinic [phone] and follow the prompts.  For any non-urgent questions, you may also contact your provider using MyChart. We now offer e-Visits for anyone 68 and older to request care online for non-urgent symptoms. For details visit [email or web address].   Also download the MyChart app! Go to the app store, search "MyChart", open the app, select [place], and log in with your MyChart username and password.

## 2023-07-06 ENCOUNTER — Inpatient Hospital Stay: Payer: 59 | Attending: Hematology

## 2023-07-06 ENCOUNTER — Inpatient Hospital Stay: Payer: 59

## 2023-07-06 VITALS — BP 95/61 | HR 70 | Resp 18 | Wt 135.6 lb

## 2023-07-06 DIAGNOSIS — Z79899 Other long term (current) drug therapy: Secondary | ICD-10-CM | POA: Diagnosis not present

## 2023-07-06 DIAGNOSIS — Z5112 Encounter for antineoplastic immunotherapy: Secondary | ICD-10-CM | POA: Diagnosis not present

## 2023-07-06 DIAGNOSIS — C9000 Multiple myeloma not having achieved remission: Secondary | ICD-10-CM | POA: Insufficient documentation

## 2023-07-06 DIAGNOSIS — D649 Anemia, unspecified: Secondary | ICD-10-CM

## 2023-07-06 LAB — CBC WITH DIFFERENTIAL/PLATELET
Abs Immature Granulocytes: 0.01 10*3/uL (ref 0.00–0.07)
Basophils Absolute: 0 10*3/uL (ref 0.0–0.1)
Basophils Relative: 1 %
Eosinophils Absolute: 0.1 10*3/uL (ref 0.0–0.5)
Eosinophils Relative: 3 %
HCT: 21.2 % — ABNORMAL LOW (ref 36.0–46.0)
Hemoglobin: 7 g/dL — ABNORMAL LOW (ref 12.0–15.0)
Immature Granulocytes: 0 %
Lymphocytes Relative: 53 %
Lymphs Abs: 2.1 10*3/uL (ref 0.7–4.0)
MCH: 33.3 pg (ref 26.0–34.0)
MCHC: 33 g/dL (ref 30.0–36.0)
MCV: 101 fL — ABNORMAL HIGH (ref 80.0–100.0)
Monocytes Absolute: 0.4 10*3/uL (ref 0.1–1.0)
Monocytes Relative: 11 %
Neutro Abs: 1.3 10*3/uL — ABNORMAL LOW (ref 1.7–7.7)
Neutrophils Relative %: 32 %
Platelets: 115 10*3/uL — ABNORMAL LOW (ref 150–400)
RBC: 2.1 MIL/uL — ABNORMAL LOW (ref 3.87–5.11)
RDW: 20 % — ABNORMAL HIGH (ref 11.5–15.5)
WBC: 4 10*3/uL (ref 4.0–10.5)
nRBC: 0 % (ref 0.0–0.2)

## 2023-07-06 LAB — COMPREHENSIVE METABOLIC PANEL
ALT: 10 U/L (ref 0–44)
AST: 12 U/L — ABNORMAL LOW (ref 15–41)
Albumin: 1.8 g/dL — ABNORMAL LOW (ref 3.5–5.0)
Alkaline Phosphatase: 244 U/L — ABNORMAL HIGH (ref 38–126)
Anion gap: 8 (ref 5–15)
BUN: 16 mg/dL (ref 8–23)
CO2: 22 mmol/L (ref 22–32)
Calcium: 6.3 mg/dL — CL (ref 8.9–10.3)
Chloride: 106 mmol/L (ref 98–111)
Creatinine, Ser: 1.13 mg/dL — ABNORMAL HIGH (ref 0.44–1.00)
GFR, Estimated: 49 mL/min — ABNORMAL LOW (ref >60)
Glucose, Bld: 81 mg/dL (ref 70–99)
Potassium: 3.3 mmol/L — ABNORMAL LOW (ref 3.5–5.1)
Sodium: 136 mmol/L (ref 135–145)
Total Bilirubin: 0.6 mg/dL (ref <1.2)
Total Protein: 5.6 g/dL — ABNORMAL LOW (ref 6.5–8.1)

## 2023-07-06 LAB — PREPARE RBC (CROSSMATCH)

## 2023-07-06 LAB — MAGNESIUM: Magnesium: 1.6 mg/dL — ABNORMAL LOW (ref 1.7–2.4)

## 2023-07-06 MED ORDER — ACETAMINOPHEN 325 MG PO TABS
650.0000 mg | ORAL_TABLET | Freq: Once | ORAL | Status: AC
  Administered 2023-07-06: 650 mg via ORAL
  Filled 2023-07-06: qty 2

## 2023-07-06 MED ORDER — SODIUM CHLORIDE 0.9% FLUSH
10.0000 mL | Freq: Once | INTRAVENOUS | Status: AC
  Administered 2023-07-06: 10 mL via INTRAVENOUS

## 2023-07-06 MED ORDER — DARATUMUMAB-HYALURONIDASE-FIHJ 1800-30000 MG-UT/15ML ~~LOC~~ SOLN
1800.0000 mg | Freq: Once | SUBCUTANEOUS | Status: AC
  Administered 2023-07-06: 1800 mg via SUBCUTANEOUS
  Filled 2023-07-06: qty 15

## 2023-07-06 MED ORDER — MAGNESIUM SULFATE 2 GM/50ML IV SOLN
2.0000 g | Freq: Once | INTRAVENOUS | Status: AC
  Administered 2023-07-06: 2 g via INTRAVENOUS
  Filled 2023-07-06: qty 50

## 2023-07-06 MED ORDER — SODIUM CHLORIDE 0.9 % IV SOLN
INTRAVENOUS | Status: DC
Start: 2023-07-06 — End: 2023-07-06

## 2023-07-06 MED ORDER — DEXAMETHASONE 4 MG PO TABS
20.0000 mg | ORAL_TABLET | Freq: Once | ORAL | Status: AC
  Administered 2023-07-06: 20 mg via ORAL
  Filled 2023-07-06: qty 5

## 2023-07-06 MED ORDER — POTASSIUM CHLORIDE CRYS ER 20 MEQ PO TBCR
40.0000 meq | EXTENDED_RELEASE_TABLET | Freq: Once | ORAL | Status: AC
Start: 2023-07-06 — End: 2023-07-06
  Administered 2023-07-06: 40 meq via ORAL
  Filled 2023-07-06: qty 2

## 2023-07-06 NOTE — Progress Notes (Signed)
Patient tolerated daratumumab injection well. Patient offered no concerns or complaints. Patient understands to leave Blood Bank bracelet on for transfusion support tomorrow. Patient provided with printed schedule for return appointment.

## 2023-07-06 NOTE — Progress Notes (Signed)
Ok to proceed with today's labs.  Corrected calcium 8.06   Ok for [employer].  T.O. Dr Carilyn Goodpasture, PharmD

## 2023-07-06 NOTE — Patient Instructions (Signed)
[facility]  Discharge Instructions: Thank you for choosing [facility] to provide your oncology and hematology care.  If you have a lab appointment with the Cancer Center - please note that after April 8th, 2024, all labs will be drawn in the cancer center.  You do not have to check in or register with the main entrance as you have in the past but will complete your check-in in the cancer center.  Wear comfortable clothing and clothing appropriate for easy access to any Portacath or PICC line.   We strive to give you quality time with your provider. You may need to reschedule your appointment if you arrive late (15 or more minutes).  Arriving late affects you and other patients whose appointments are after yours.  Also, if you miss three or more appointments without notifying the office, you may be dismissed from the clinic at the provider's discretion.      For prescription refill requests, have your pharmacy contact our office and allow 72 hours for refills to be completed.    Today you received the following chemotherapy and/or immunotherapy agents: Daratumumab       To help prevent nausea and vomiting after your treatment, we encourage you to take your nausea medication as directed.  BELOW ARE SYMPTOMS THAT SHOULD BE REPORTED IMMEDIATELY: *FEVER GREATER THAN 100.4 F (38 C) OR HIGHER *CHILLS OR SWEATING *NAUSEA AND VOMITING THAT IS NOT CONTROLLED WITH YOUR NAUSEA MEDICATION *UNUSUAL SHORTNESS OF BREATH *UNUSUAL BRUISING OR BLEEDING *URINARY PROBLEMS (pain or burning when urinating, or frequent urination) *BOWEL PROBLEMS (unusual diarrhea, constipation, pain near the anus) TENDERNESS IN MOUTH AND THROAT WITH OR WITHOUT PRESENCE OF ULCERS (sore throat, sores in mouth, or a toothache) UNUSUAL RASH, SWELLING OR PAIN  UNUSUAL VAGINAL DISCHARGE OR ITCHING   Items with * indicate a potential emergency and should be  followed up as soon as possible or go to the Emergency Department if any problems should occur.  Please show the CHEMOTHERAPY ALERT CARD or IMMUNOTHERAPY ALERT CARD at check-in to the Emergency Department and triage nurse.  Should you have questions after your visit or need to cancel or reschedule your appointment, please contact [facility] [place] [facility] - A DEPT OF [facility] Bride[facility]room [facility] [phone]  and follow the prompts.  Office hours are 8:00 a.m. to 4:30 p.m. Monday - Friday. Please note that voicemails left after 4:00 p.m. may not be returned until the following business day.  We are closed weekends and major holidays. You have access to a nurse at all times for urgent questions. Please call the main number to the clinic [phone] and follow the prompts.  For any non-urgent questions, you may also contact your provider using MyChart. We now offer e-Visits for anyone 71 and older to request care online for non-urgent symptoms. For details visit [email or web address].   Also download the MyChart app! Go to the app store, search "MyChart", open the app, select [place], and log in with your MyChart username and password.

## 2023-07-06 NOTE — Progress Notes (Signed)
CRITICAL VALUE ALERT Critical value received:  Calcium 6.3 Date of notification:  07-07-2023 Time of notification: 09:00 am. Critical value read back:  Yes.   Nurse who received alert:  B.Jamari Diana RN.  MD notified time and response:  Katragadda @ 09:00 am.

## 2023-07-07 ENCOUNTER — Other Ambulatory Visit: Payer: Self-pay | Admitting: Hematology

## 2023-07-07 ENCOUNTER — Other Ambulatory Visit: Payer: Self-pay

## 2023-07-07 ENCOUNTER — Telehealth: Payer: Self-pay

## 2023-07-07 ENCOUNTER — Inpatient Hospital Stay: Payer: 59

## 2023-07-07 DIAGNOSIS — C9000 Multiple myeloma not having achieved remission: Secondary | ICD-10-CM

## 2023-07-07 LAB — KAPPA/LAMBDA LIGHT CHAINS
Kappa free light chain: 91.5 mg/L — ABNORMAL HIGH (ref 3.3–19.4)
Kappa, lambda light chain ratio: 5.2 — ABNORMAL HIGH (ref 0.26–1.65)
Lambda free light chains: 17.6 mg/L (ref 5.7–26.3)

## 2023-07-07 NOTE — Progress Notes (Signed)
Disenrolled patient from Transport planner.

## 2023-07-07 NOTE — Telephone Encounter (Signed)
Attempted to call patient about today's appointment, no answer from patient.

## 2023-07-08 ENCOUNTER — Inpatient Hospital Stay: Payer: 59

## 2023-07-08 DIAGNOSIS — Z5112 Encounter for antineoplastic immunotherapy: Secondary | ICD-10-CM | POA: Diagnosis not present

## 2023-07-08 DIAGNOSIS — Z79899 Other long term (current) drug therapy: Secondary | ICD-10-CM | POA: Diagnosis not present

## 2023-07-08 DIAGNOSIS — C9000 Multiple myeloma not having achieved remission: Secondary | ICD-10-CM | POA: Diagnosis not present

## 2023-07-08 DIAGNOSIS — D649 Anemia, unspecified: Secondary | ICD-10-CM

## 2023-07-08 MED ORDER — ACETAMINOPHEN 325 MG PO TABS
650.0000 mg | ORAL_TABLET | Freq: Once | ORAL | Status: AC
  Administered 2023-07-08: 650 mg via ORAL
  Filled 2023-07-08: qty 2

## 2023-07-08 MED ORDER — SODIUM CHLORIDE 0.9% IV SOLUTION
250.0000 mL | INTRAVENOUS | Status: DC
  Administered 2023-07-08: 250 mL via INTRAVENOUS

## 2023-07-08 MED ORDER — HEPARIN SOD (PORK) LOCK FLUSH 100 UNIT/ML IV SOLN
500.0000 [IU] | Freq: Every day | INTRAVENOUS | Status: AC | PRN
Start: 2023-07-08 — End: 2023-07-08
  Administered 2023-07-08: 500 [IU]

## 2023-07-08 MED ORDER — DIPHENHYDRAMINE HCL 25 MG PO CAPS
25.0000 mg | ORAL_CAPSULE | Freq: Once | ORAL | Status: AC
  Administered 2023-07-08: 25 mg via ORAL
  Filled 2023-07-08: qty 1

## 2023-07-08 MED ORDER — SODIUM CHLORIDE 0.9% FLUSH
10.0000 mL | INTRAVENOUS | Status: AC | PRN
Start: 2023-07-08 — End: 2023-07-08
  Administered 2023-07-08: 10 mL

## 2023-07-08 NOTE — Patient Instructions (Signed)
[facility] [place] [facility]  Discharge Instructions: Thank you for choosing [facility] to provide your oncology and hematology care.  If you have a lab appointment with the Cancer Center - please note that after April 8th, 2024, all labs will be drawn in the cancer center.  You do not have to check in or register with the main entrance as you have in the past but will complete your check-in in the cancer center.  Wear comfortable clothing and clothing appropriate for easy access to any Portacath or PICC line.   We strive to give you quality time with your provider. You may need to reschedule your appointment if you arrive late (15 or more minutes).  Arriving late affects you and other patients whose appointments are after yours.  Also, if you miss three or more appointments without notifying the office, you may be dismissed from the clinic at the provider's discretion.      For prescription refill requests, have your pharmacy contact our office and allow 72 hours for refills to be completed.    Today you received 1 unit of blood.   BELOW ARE SYMPTOMS THAT SHOULD BE REPORTED IMMEDIATELY: *FEVER GREATER THAN 100.4 F (38 C) OR HIGHER *CHILLS OR SWEATING *NAUSEA AND VOMITING THAT IS NOT CONTROLLED WITH YOUR NAUSEA MEDICATION *UNUSUAL SHORTNESS OF BREATH *UNUSUAL BRUISING OR BLEEDING *URINARY PROBLEMS (pain or burning when urinating, or frequent urination) *BOWEL PROBLEMS (unusual diarrhea, constipation, pain near the anus) TENDERNESS IN MOUTH AND THROAT WITH OR WITHOUT PRESENCE OF ULCERS (sore throat, sores in mouth, or a toothache) UNUSUAL RASH, SWELLING OR PAIN  UNUSUAL VAGINAL DISCHARGE OR ITCHING   Items with * indicate a potential emergency and should be followed up as soon as possible or go to the Emergency Department if any problems should occur.  Please show the CHEMOTHERAPY ALERT CARD or IMMUNOTHERAPY ALERT CARD at check-in to  the Emergency Department and triage nurse.  Should you have questions after your visit or need to cancel or reschedule your appointment, please contact [facility] [place] [facility] - A DEPT OF [facility] Bridegroom [facility] [phone]  and follow the prompts.  Office hours are 8:00 a.m. to 4:30 p.m. Monday - Friday. Please note that voicemails left after 4:00 p.m. may not be returned until the following business day.  We are closed weekends and major holidays. You have access to a nurse at all times for urgent questions. Please call the main number to the clinic [phone] and follow the prompts.  For any non-urgent questions, you may also contact your provider using MyChart. We now offer e-Visits for anyone 25 and older to request care online for non-urgent symptoms. For details visit [email or web address].   Also download the MyChart app! Go to the app store, search "MyChart", open the app, select [place], and log in with your MyChart username and password.

## 2023-07-08 NOTE — Progress Notes (Signed)
Patient presents today for 1 unit of blood per provider's order. Vital signs stable and patient voiced no new complaints at this time.   Discharged from clinic via wheelchair in stable condition. Alert and oriented x 3. F/U with [facility] as scheduled.

## 2023-07-09 LAB — TYPE AND SCREEN
ABO/RH(D): AB POS
Antibody Screen: POSITIVE
Unit division: 0
Unit division: 0

## 2023-07-09 LAB — BPAM RBC
Blood Product Expiration Date: 202412142359
Blood Product Expiration Date: 202412272359
ISSUE DATE / TIME: 202412061218
Unit Type and Rh: 8400
Unit Type and Rh: 8400

## 2023-07-11 ENCOUNTER — Other Ambulatory Visit: Payer: Self-pay | Admitting: *Deleted

## 2023-07-11 LAB — PROTEIN ELECTROPHORESIS, SERUM
A/G Ratio: 0.6 — ABNORMAL LOW (ref 0.7–1.7)
Albumin ELP: 2.2 g/dL — ABNORMAL LOW (ref 2.9–4.4)
Alpha-1-Globulin: 0.4 g/dL (ref 0.0–0.4)
Alpha-2-Globulin: 0.7 g/dL (ref 0.4–1.0)
Beta Globulin: 1.9 g/dL — ABNORMAL HIGH (ref 0.7–1.3)
Gamma Globulin: 0.4 g/dL (ref 0.4–1.8)
Globulin, Total: 3.4 g/dL (ref 2.2–3.9)
M-Spike, %: 1.4 g/dL — ABNORMAL HIGH
Total Protein ELP: 5.6 g/dL — ABNORMAL LOW (ref 6.0–8.5)

## 2023-07-11 MED ORDER — LENALIDOMIDE 10 MG PO CAPS: 10.0000 mg | ORAL_CAPSULE | Freq: Every day | ORAL | 0 refills | Status: DC

## 2023-07-12 ENCOUNTER — Encounter: Payer: Self-pay | Admitting: Hematology

## 2023-07-12 DIAGNOSIS — K5900 Constipation, unspecified: Secondary | ICD-10-CM | POA: Diagnosis not present

## 2023-07-12 DIAGNOSIS — I959 Hypotension, unspecified: Secondary | ICD-10-CM | POA: Diagnosis not present

## 2023-07-12 DIAGNOSIS — E039 Hypothyroidism, unspecified: Secondary | ICD-10-CM | POA: Diagnosis not present

## 2023-07-12 DIAGNOSIS — C9000 Multiple myeloma not having achieved remission: Secondary | ICD-10-CM | POA: Diagnosis not present

## 2023-07-14 ENCOUNTER — Encounter: Payer: Self-pay | Admitting: Hematology

## 2023-07-20 ENCOUNTER — Inpatient Hospital Stay: Payer: 59

## 2023-07-20 ENCOUNTER — Inpatient Hospital Stay: Payer: 59 | Admitting: Hematology

## 2023-07-20 VITALS — Wt 136.6 lb

## 2023-07-20 VITALS — BP 99/62 | HR 72 | Temp 97.7°F | Resp 18

## 2023-07-20 DIAGNOSIS — C9000 Multiple myeloma not having achieved remission: Secondary | ICD-10-CM

## 2023-07-20 DIAGNOSIS — Z95828 Presence of other vascular implants and grafts: Secondary | ICD-10-CM

## 2023-07-20 DIAGNOSIS — Z79899 Other long term (current) drug therapy: Secondary | ICD-10-CM | POA: Diagnosis not present

## 2023-07-20 DIAGNOSIS — Z5112 Encounter for antineoplastic immunotherapy: Secondary | ICD-10-CM | POA: Diagnosis not present

## 2023-07-20 LAB — CBC WITH DIFFERENTIAL/PLATELET
Abs Immature Granulocytes: 0.02 10*3/uL (ref 0.00–0.07)
Basophils Absolute: 0 10*3/uL (ref 0.0–0.1)
Basophils Relative: 0 %
Eosinophils Absolute: 0 10*3/uL (ref 0.0–0.5)
Eosinophils Relative: 1 %
HCT: 26.9 % — ABNORMAL LOW (ref 36.0–46.0)
Hemoglobin: 8.5 g/dL — ABNORMAL LOW (ref 12.0–15.0)
Immature Granulocytes: 0 %
Lymphocytes Relative: 48 %
Lymphs Abs: 2.2 10*3/uL (ref 0.7–4.0)
MCH: 32.6 pg (ref 26.0–34.0)
MCHC: 31.6 g/dL (ref 30.0–36.0)
MCV: 103.1 fL — ABNORMAL HIGH (ref 80.0–100.0)
Monocytes Absolute: 0.5 10*3/uL (ref 0.1–1.0)
Monocytes Relative: 10 %
Neutro Abs: 1.9 10*3/uL (ref 1.7–7.7)
Neutrophils Relative %: 41 %
Platelets: 208 10*3/uL (ref 150–400)
RBC: 2.61 MIL/uL — ABNORMAL LOW (ref 3.87–5.11)
RDW: 19 % — ABNORMAL HIGH (ref 11.5–15.5)
WBC: 4.6 10*3/uL (ref 4.0–10.5)
nRBC: 0 % (ref 0.0–0.2)

## 2023-07-20 LAB — COMPREHENSIVE METABOLIC PANEL
ALT: 11 U/L (ref 0–44)
AST: 16 U/L (ref 15–41)
Albumin: 1.9 g/dL — ABNORMAL LOW (ref 3.5–5.0)
Alkaline Phosphatase: 249 U/L — ABNORMAL HIGH (ref 38–126)
Anion gap: 9 (ref 5–15)
BUN: 13 mg/dL (ref 8–23)
CO2: 22 mmol/L (ref 22–32)
Calcium: 6.6 mg/dL — ABNORMAL LOW (ref 8.9–10.3)
Chloride: 107 mmol/L (ref 98–111)
Creatinine, Ser: 0.65 mg/dL (ref 0.44–1.00)
GFR, Estimated: >60 mL/min (ref >60)
Glucose, Bld: 85 mg/dL (ref 70–99)
Potassium: 3 mmol/L — ABNORMAL LOW (ref 3.5–5.1)
Sodium: 138 mmol/L (ref 135–145)
Total Bilirubin: 0.5 mg/dL (ref <1.2)
Total Protein: 5.2 g/dL — ABNORMAL LOW (ref 6.5–8.1)

## 2023-07-20 LAB — MAGNESIUM: Magnesium: 1.9 mg/dL (ref 1.7–2.4)

## 2023-07-20 LAB — SAMPLE TO BLOOD BANK

## 2023-07-20 MED ORDER — SODIUM CHLORIDE 0.9% FLUSH
10.0000 mL | INTRAVENOUS | Status: DC | PRN
  Administered 2023-07-20: 10 mL via INTRAVENOUS

## 2023-07-20 MED ORDER — POTASSIUM CHLORIDE CRYS ER 20 MEQ PO TBCR
40.0000 meq | EXTENDED_RELEASE_TABLET | Freq: Once | ORAL | Status: AC
Start: 2023-07-20 — End: 2023-07-20
  Administered 2023-07-20: 40 meq via ORAL
  Filled 2023-07-20: qty 2

## 2023-07-20 MED ORDER — SODIUM CHLORIDE FLUSH 0.9 % IV SOLN
10.0000 mL | Freq: Once | INTRAVENOUS | Status: AC
Start: 2023-07-20 — End: 2023-07-20
  Administered 2023-07-20: 10 mL via INTRAVENOUS
  Filled 2023-07-20: qty 10

## 2023-07-20 MED ORDER — DEXAMETHASONE 4 MG PO TABS
20.0000 mg | ORAL_TABLET | Freq: Once | ORAL | Status: AC
  Administered 2023-07-20: 20 mg via ORAL
  Filled 2023-07-20: qty 5

## 2023-07-20 MED ORDER — ACETAMINOPHEN 325 MG PO TABS
650.0000 mg | ORAL_TABLET | Freq: Once | ORAL | Status: AC
Start: 2023-07-20 — End: 2023-07-20
  Administered 2023-07-20: 650 mg via ORAL
  Filled 2023-07-20: qty 2

## 2023-07-20 MED ORDER — DARATUMUMAB-HYALURONIDASE-FIHJ 1800-30000 MG-UT/15ML ~~LOC~~ SOLN
1800.0000 mg | Freq: Once | SUBCUTANEOUS | Status: AC
  Administered 2023-07-20: 1800 mg via SUBCUTANEOUS
  Filled 2023-07-20: qty 15

## 2023-07-20 MED ORDER — HEPARIN SOD (PORK) LOCK FLUSH 100 UNIT/ML IV SOLN
500.0000 [IU] | Freq: Once | INTRAVENOUS | Status: AC
  Administered 2023-07-20: 500 [IU] via INTRAVENOUS

## 2023-07-20 NOTE — Progress Notes (Signed)
Labs reviewed with MD today. Ok to treat per MD.   Treatment given per orders. Patient tolerated it well without problems. Vitals stable and discharged home from clinic via wheelchair. Follow up as scheduled.

## 2023-07-20 NOTE — Patient Instructions (Signed)
[facility] [place] [facility]  Discharge Instructions: Thank you for choosing [facility] to provide your oncology and hematology care.  If you have a lab appointment with the Cancer Center - please note that after April 8th, 2024, all labs will be drawn in the cancer center.  You do not have to check in or register with the main entrance as you have in the past but will complete your check-in in the cancer center.  Wear comfortable clothing and clothing appropriate for easy access to any Portacath or PICC line.   We strive to give you quality time with your provider. You may need to reschedule your appointment if you arrive late (15 or more minutes).  Arriving late affects you and other patients whose appointments are after yours.  Also, if you miss three or more appointments without notifying the office, you may be dismissed from the clinic at the provider's discretion.      For prescription refill requests, have your pharmacy contact our office and allow 72 hours for refills to be completed.    Today you received the following chemotherapy and/or immunotherapy agents Darzalez faspro    To help prevent nausea and vomiting after your treatment, we encourage you to take your nausea medication as directed.  BELOW ARE SYMPTOMS THAT SHOULD BE REPORTED IMMEDIATELY: *FEVER GREATER THAN 100.4 F (38 C) OR HIGHER *CHILLS OR SWEATING *NAUSEA AND VOMITING THAT IS NOT CONTROLLED WITH YOUR NAUSEA MEDICATION *UNUSUAL SHORTNESS OF BREATH *UNUSUAL BRUISING OR BLEEDING *URINARY PROBLEMS (pain or burning when urinating, or frequent urination) *BOWEL PROBLEMS (unusual diarrhea, constipation, pain near the anus) TENDERNESS IN MOUTH AND THROAT WITH OR WITHOUT PRESENCE OF ULCERS (sore throat, sores in mouth, or a toothache) UNUSUAL RASH, SWELLING OR PAIN  UNUSUAL VAGINAL DISCHARGE OR ITCHING   Items with * indicate a potential emergency and should be  followed up as soon as possible or go to the Emergency Department if any problems should occur.  Please show the CHEMOTHERAPY ALERT CARD or IMMUNOTHERAPY ALERT CARD at check-in to the Emergency Department and triage nurse.  Should you have questions after your visit or need to cancel or reschedule your appointment, please contact [facility] [place] [facility] - A DEPT OF [facility] Bridegroom [facility] [phone]  and follow the prompts.  Office hours are 8:00 a.m. to 4:30 p.m. Monday - Friday. Please note that voicemails left after 4:00 p.m. may not be returned until the following business day.  We are closed weekends and major holidays. You have access to a nurse at all times for urgent questions. Please call the main number to the clinic [phone] and follow the prompts.  For any non-urgent questions, you may also contact your provider using MyChart. We now offer e-Visits for anyone 84 and older to request care online for non-urgent symptoms. For details visit [email or web address].   Also download the MyChart app! Go to the app store, search "MyChart", open the app, select [place], and log in with your MyChart username and password.

## 2023-07-20 NOTE — Patient Instructions (Signed)

## 2023-07-20 NOTE — Progress Notes (Signed)
[facility] [address] [place] [address]    Clinic Day:  07/20/2023  Referring physician: [name], MD  Patient Care Team: [name], MD as PCP - General (Internal Medicine) Wyline Mood Dorothe Pea, MD as PCP - Cardiology (Cardiology) Dorisann Frames, MD as Referring Physician (Endocrinology) Doreatha Massed, MD as Medical Oncologist (Medical Oncology) Therese Sarah, RN as Oncology Nurse Navigator (Medical Oncology)   ASSESSMENT & PLAN:   Assessment: 1.  IgG kappa multiple myeloma: - Presentation with malignant hypercalcemia since 01/23/2023 - 04/08/2023: M spike-5.1 g, kappa light chains 872, lambda light chains 4.1, ratio 212.68.  Beta-2 microglobulin 3.6.  LDH 200. - BMBX (04/08/2023): Lambda restricted plasma cells involving approximately 50% of the cellular marrow. - Cytogenetics: 46, XX[20] - Darzalex cycle 1 day 1 started on 04/26/2023.  Revlimid 10 mg started on 06/09/2023 - Hospitalization from 05/11/2023 through 05/18/2023 with acute kidney injury, cystitis from E. coli.   2.  Social/family history: - Prior to recent hospitalization, she was living by herself at home.  She is currently resident at [facility] in [place].  Her 2 brothers live in [place].  She is a retired Engineer, site.  Non-smoker.  1 brother had thyroid cancer.  Another brother had prostate cancer.  Father had lung cancer.    Plan: 1.  IgG kappa multiple myeloma: - She is tolerating Darzalex injection very well. - We reviewed myeloma labs from 07/06/2023: M spike improved to 1.4 g from 5 g.  FLC ratio also improved 5.20 and kappa light chains 91.5. - Labs today: LFTs are grossly normal.  Albumin improved to 1.9.  CBC was grossly normal. - I have talked to her brother on the phone about the results. - If she we will be permanent resident at [facility] nursing home, I will make a referral to medical oncology in [place].  Patient would like to  continue at our center and let me know at next time. - She will proceed with treatment today and every 2 weeks.  Will hold her Rivka Barbara as her calcium is low.  RTC 6 weeks for follow-up with myeloma labs.   2.  ID prophylaxis: - Continue acyclovir and aspirin.   3.  Hypoalbuminemia: - Likely from combination of malnutrition and myeloma.  Albumin improved to 1.9.   4.  Hypokalemia: - Continue potassium 20 mEq daily.  Potassium is 3.0.   5.  Hypomagnesemia: - Continue magnesium daily.  Magnesium is normal today.   6.  Low back pain: - Continue tramadol as needed.    Orders Placed This Encounter  Procedures   Comprehensive metabolic panel    Standing Status:   Future    Expected Date:   09/22/2023    Expiration Date:   09/21/2024   CBC with Differential    Standing Status:   Future    Expected Date:   09/22/2023    Expiration Date:   09/21/2024   Comprehensive metabolic panel    Standing Status:   Future    Expected Date:   10/06/2023    Expiration Date:   10/05/2024   CBC with Differential    Standing Status:   Future    Expected Date:   10/06/2023    Expiration Date:   10/05/2024   Comprehensive metabolic panel    Standing Status:   Future    Expected Date:   10/20/2023    Expiration Date:   10/19/2024   CBC with  Differential    Standing Status:   Future    Expected Date:   10/20/2023    Expiration Date:   10/19/2024   Comprehensive metabolic panel    Standing Status:   Future    Expected Date:   11/03/2023    Expiration Date:   11/02/2024   CBC with Differential    Standing Status:   Future    Expected Date:   11/03/2023    Expiration Date:   11/02/2024   Comprehensive metabolic panel    Standing Status:   Future    Expected Date:   11/17/2023    Expiration Date:   11/16/2024   CBC with Differential    Standing Status:   Future    Expected Date:   11/17/2023    Expiration Date:   11/16/2024      I,Katie Daubenspeck,acting as a scribe for Doreatha Massed, MD.,have documented all  relevant documentation on the behalf of Doreatha Massed, MD,as directed by  Doreatha Massed, MD while in the presence of Doreatha Massed, MD.   I, Doreatha Massed MD, have reviewed the above documentation for accuracy and completeness, and I agree with the above.   Doreatha Massed, MD   12/18/20246:28 PM  CHIEF COMPLAINT:   Diagnosis: Multiple myeloma    Cancer Staging  Multiple myeloma (HCC) Staging form: Plasma Cell Myeloma and Plasma Cell Disorders, AJCC 8th Edition - Clinical stage from 04/14/2023: Beta-2-microglobulin (mg/L): 3.6, Albumin (g/dL): 1.5, ISS: Stage II, LDH: Elevated - Unsigned    Prior Therapy: none  Current Therapy:  Daratumumab, lenalidomide and dexamethasone    HISTORY OF PRESENT ILLNESS:   Oncology History  Multiple myeloma (HCC)  04/14/2023 Initial Diagnosis   Multiple myeloma (HCC)   04/26/2023 -  Chemotherapy   Patient is on Treatment Plan : MYELOMA Newly Diagnosed Daratumumab SQ + Lenalidomide + Dexamethasone (DaraRd) q28d        INTERVAL HISTORY:   Kathy [name] is a 82 y.o. female presenting to clinic today for follow up of Multiple myeloma. She was last seen by me on 06/16/23.  Today, she states that she is doing well overall. Her appetite level is at 40%. Her energy level is at 25%.  PAST MEDICAL HISTORY:   Past Medical History: Past Medical History:  Diagnosis Date   Abnormal findings on diagnostic imaging of heart and coronary circulation    Anemia    FROM BLEEDING ULCER   Anxiety    takes Alprazolam daily as needed   Arthritis    dx with RA 2017   Bariatric surgery status    Cataract    Diverticulosis    Gastro-esophageal reflux disease with esophagitis    Gastrojejunal ulcer with hemorrhage    Headache(784.0)    occasionally   History of blood transfusion    no abnormal reaction noted   History of bronchitis    last time many yrs ago   Hyperlipidemia    PT DENIES THIS DX -  ON NO MEDS AND NO ONE HAS TOLD HER    Hypertension    takes Amlodipine daily   Hypothyroidism    takes Synthroid daily   Insomnia    Joint pain    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   Localized edema    Nocturia    Numbness    occasionally left arm at night   Obesity    Osteoporosis    takes Fosamax weekly   Pain in joint involving pelvic region and thigh  Peripheral edema    takes Lasix daily as needed   PONV (postoperative nausea and vomiting)    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/2017   Rheumatoid arthritis, unspecified (HCC)    Rotator cuff arthropathy, 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   Sleep apnea    Phreesia 08/22/2020   Stomach ulcer    Thyroid disease    Phreesia 08/22/2020   Unspecified injury of muscle(s) and tendon(s) of the rotator cuff of left shoulder, subsequent encounter    Unspecified osteoarthritis, unspecified site    Wears glasses    Wears partial dentures     Surgical History: Past Surgical History:  Procedure Laterality Date   ABDOMINAL HYSTERECTOMY     partial   cataract surgery Bilateral    CHOLECYSTECTOMY     COLONOSCOPY     ESOPHAGOGASTRODUODENOSCOPY (EGD) WITH PROPOFOL N/A 01/11/2022   Procedure: ESOPHAGOGASTRODUODENOSCOPY (EGD) WITH PROPOFOL;  Surgeon: Iva Boop, MD;  Location[facility] ENDOSCOPY;  Service: Gastroenterology;  Laterality: N/A;   EYE SURGERY     CATARACTS BOTH   gastric bypass surgery     HOT HEMOSTASIS N/A 01/11/2022   Procedure: HOT HEMOSTASIS (ARGON PLASMA COAGULATION/BICAP);  Surgeon: Iva Boop, MD;  Location: [facility] ENDOSCOPY;  Service: Gastroenterology;  Laterality: N/A;   IR IMAGING GUIDED PORT INSERTION  05/23/2023   JOINT REPLACEMENT Bilateral    hip   REVERSE SHOULDER ARTHROPLASTY Right 05/11/2016   Procedure: REVERSE SHOULDER ARTHROPLASTY;  Surgeon: Teryl Lucy, MD;  Location: [facility] OR;  Service: Orthopedics;  Laterality: Right;   REVISION TOTAL HIP ARTHROPLASTY Left 10/03/2013   DR Turner Daniels   ROTATOR CUFF REPAIR     SCLEROTHERAPY   01/11/2022   Procedure: Susa Day;  Surgeon: Iva Boop, MD;  Location: [facility] ENDOSCOPY;  Service: Gastroenterology;;   TOTAL HIP REVISION Left 10/03/2013   Procedure: TOTAL HIP REVISION- left;  Surgeon: Nestor Lewandowsky, MD;  Location: [facility] OR;  Service: Orthopedics;  Laterality: Left;   TOTAL KNEE ARTHROPLASTY Left 11/29/2017   Procedure: LEFT TOTAL KNEE ARTHROPLASTY;  Surgeon: Teryl Lucy, MD;  Location: [facility] OR;  Service: Orthopedics;  Laterality: Left;   TOTAL SHOULDER ARTHROPLASTY Left 11/09/2016   Procedure: TOTAL REVERSE SHOULDER ARTHROPLASTY;  Surgeon: Teryl Lucy, MD;  Location: [facility] OR;  Service: Orthopedics;  Laterality: Left;   TOTAL SHOULDER REPLACEMENT Left 10/2016   TOTAL THYROIDECTOMY     UPPER GASTROINTESTINAL ENDOSCOPY      Social History: Social History   Socioeconomic History   Marital status: Divorced    Spouse name: Not on file   Number of children: 0   Years of education: Not on file   Highest education level: Not on file  Occupational History   Occupation: disabled  Tobacco Use   Smoking status: Never    Passive exposure: Never   Smokeless tobacco: Never  Vaping Use   Vaping status: Never Used  Substance and Sexual Activity   Alcohol use: No   Drug use: No   Sexual activity: Not Currently    Birth control/protection: Surgical  Other Topics Concern   Not on file  Social History Narrative   Lives alone : "at [facility] for rehab right now."   Social Drivers of Health   Financial Resource Strain: Low Risk  (09/14/2021)   Overall Financial Resource Strain (CARDIA)    Difficulty of Paying Living Expenses: Not hard at all  Food Insecurity: Patient Unable To Answer (05/12/2023)   Hunger Vital Sign    Worried About Programme researcher, broadcasting/film/video  in the Last Year: Patient unable to answer    Ran Out of Food in the Last Year: Patient unable to answer  Transportation Needs: Patient Unable To Answer (05/12/2023)   PRAPARE - Transportation    Lack of  Transportation (Medical): Patient unable to answer    Lack of Transportation (Non-Medical): Patient unable to answer  Physical Activity: Sufficiently Active (09/14/2021)   Exercise Vital Sign    Days of Exercise per Week: 6 days    Minutes of Exercise per Session: 40 min  Stress: No Stress Concern Present (09/14/2021)   [employer] of Occupational Health - Occupational Stress Questionnaire    Feeling of Stress : Not at all  Social Connections: Moderately Isolated (09/14/2021)   Social Connection and Isolation Panel [NHANES]    Frequency of Communication with Friends and Family: More than three times a week    Frequency of Social Gatherings with Friends and Family: More than three times a week    Attends Religious Services: More than 4 times per year    Active Member of [employer] or Organizations: No    Attends Banker Meetings: Never    Marital Status: Divorced  Catering manager Violence: Patient Unable To Answer (05/12/2023)   Humiliation, Afraid, Rape, and Kick questionnaire    Fear of Current or Ex-Partner: Patient unable to answer    Emotionally Abused: Patient unable to answer    Physically Abused: Patient unable to answer    Sexually Abused: Patient unable to answer    Family History: Family History  Problem Relation Age of Onset   Diabetes Mother    Congestive Heart Failure Mother    Lung cancer Father    Thrombosis Sister    CAD Brother        CABG   Colon cancer Neg Hx    Esophageal cancer Neg Hx    Stomach cancer Neg Hx    Rectal cancer Neg Hx     Current Medications:  Current Outpatient Medications:    acetaminophen (TYLENOL) 500 MG tablet, Take 1 tablet (500 mg total) by mouth every 6 (six) hours as needed for mild pain, headache or fever (or Fever >/= 101)., Disp: 30 tablet, Rfl: 0   acyclovir (ZOVIRAX) 400 MG tablet, Take 1 tablet (400 mg total) by mouth 2 (two) times daily., Disp: 60 tablet, Rfl: 11   Adalimumab (HUMIRA PEN) 40 MG/0.4ML PNKT,  Inject 40 mg into the skin every Saturday., Disp: , Rfl:    amoxicillin (AMOXIL) 500 MG capsule, Take 1 capsule (500 mg total) by mouth every 8 (eight) hours. X 7 days, Disp: , Rfl:    Aspirin 81 MG CAPS, Take 1 capsule by mouth daily., Disp: , Rfl:    calcium carbonate (TUMS - DOSED IN MG ELEMENTAL CALCIUM) 500 MG chewable tablet, Chew 1 tablet (200 mg of elemental calcium total) by mouth 3 (three) times daily with meals., Disp: , Rfl:    denosumab (PROLIA) 60 MG/ML SOSY injection, 60mg  Subcutaneous every 6 months 180 days, Disp: 1 mL, Rfl: 1   ferrous sulfate 325 (65 FE) MG EC tablet, Take 1 tablet (325 mg total) by mouth daily., Disp: 60 tablet, Rfl: 3   lenalidomide (REVLIMID) 10 MG capsule, Take 1 capsule (10 mg total) by mouth daily. Take 10 mg by mouth for 21 days. Take 21 days on, 7 days off every 28 days, Disp: 21 capsule, Rfl: 0   levothyroxine (SYNTHROID) 137 MCG tablet, Take 0.5 tablets (68.5 mcg total) by  mouth daily at 6 (six) AM., Disp: , Rfl:    magnesium oxide (MAG-OX) 400 (240 Mg) MG tablet, Take 1 tablet (400 mg total) by mouth daily., Disp: , Rfl:    midodrine (PROAMATINE) 2.5 MG tablet, Take 3 tablets (7.5 mg total) by mouth 3 (three) times daily with meals., Disp: , Rfl:    Multiple Vitamin (MULTIVITAMIN WITH MINERALS) TABS tablet, Take 1 tablet by mouth every morning., Disp: , Rfl:    naloxone (NARCAN) nasal spray 4 mg/0.1 mL, Place 1 spray into the nose as needed (opioid reversal)., Disp: , Rfl:    Omega-3 Fatty Acids (FISH OIL) 1000 MG CAPS, Take 1 capsule by mouth daily., Disp: , Rfl:    omeprazole (PRILOSEC) 40 MG capsule, Take 1 capsule (40 mg total) by mouth daily before breakfast. Open capsule and swallow granules with liquid or applesauce, Disp: 90 capsule, Rfl: 3   ondansetron (ZOFRAN) 4 MG tablet, Take 4 mg by mouth every 6 (six) hours as needed for nausea or vomiting., Disp: , Rfl:    oxybutynin (DITROPAN-XL) 10 MG 24 hr tablet, Take 10 mg by mouth at bedtime., Disp:  , Rfl:    polyethylene glycol (MIRALAX / GLYCOLAX) 17 g packet, Take 17 g by mouth daily., Disp: 14 each, Rfl: 0   traMADol (ULTRAM) 50 MG tablet, Take 1 tablet (50 mg total) by mouth every 8 (eight) hours as needed for moderate pain (pain score 4-6)., Disp: 12 tablet, Rfl: 0   Allergies: Allergies  Allergen Reactions   Macrobid [Nitrofurantoin] Other (See Comments)    Stomach pain    REVIEW OF SYSTEMS:   Review of Systems  Constitutional:  Negative for chills, fatigue and fever.  HENT:   Negative for lump/mass, mouth sores, nosebleeds, sore throat and trouble swallowing.   Eyes:  Negative for eye problems.  Respiratory:  Negative for cough and shortness of breath.   Cardiovascular:  Negative for chest pain, leg swelling and palpitations.  Gastrointestinal:  Positive for nausea. Negative for abdominal pain, constipation, diarrhea and vomiting.  Genitourinary:  Negative for bladder incontinence, difficulty urinating, dysuria, frequency, hematuria and nocturia.   Musculoskeletal:  Negative for arthralgias, back pain, flank pain, myalgias and neck pain.  Skin:  Negative for itching and rash.  Neurological:  Positive for numbness. Negative for dizziness and headaches.  Hematological:  Does not bruise/bleed easily.  Psychiatric/Behavioral:  Negative for depression, sleep disturbance and suicidal ideas. The patient is not nervous/anxious.   All other systems reviewed and are negative.    VITALS:   There were no vitals taken for this visit.  Wt Readings from Last 3 Encounters:  07/20/23 136 lb 9.6 oz (62 kg)  07/06/23 135 lb 9.6 oz (61.5 kg)  06/16/23 131 lb (59.4 kg)    There is no height or weight on file to calculate BMI.  Performance status (ECOG): 1 - Symptomatic but completely ambulatory  PHYSICAL EXAM:   Physical Exam Vitals and nursing note reviewed. Exam conducted with a chaperone present.  Constitutional:      Appearance: Normal appearance.  Cardiovascular:     Rate  and Rhythm: Normal rate and regular rhythm.     Pulses: Normal pulses.     Heart sounds: Normal heart sounds.  Pulmonary:     Effort: Pulmonary effort is normal.     Breath sounds: Normal breath sounds.  Abdominal:     Palpations: Abdomen is soft. There is no hepatomegaly, splenomegaly or mass.     Tenderness: There  is no abdominal tenderness.  Musculoskeletal:     Right lower leg: Edema present.     Left lower leg: Edema present.  Lymphadenopathy:     Cervical: No cervical adenopathy.     Right cervical: No superficial, deep or posterior cervical adenopathy.    Left cervical: No superficial, deep or posterior cervical adenopathy.     Upper Body:     Right upper body: No supraclavicular or axillary adenopathy.     Left upper body: No supraclavicular or axillary adenopathy.  Neurological:     General: No focal deficit present.     Mental Status: She is alert and oriented to person, place, and time.  Psychiatric:        Mood and Affect: Mood normal.        Behavior: Behavior normal.     LABS:      Latest Ref Rng & Units 07/20/2023    9:31 AM 07/06/2023    8:24 AM 06/29/2023    8:52 AM  CBC  WBC 4.0 - 10.5 K/uL 4.6  4.0  2.7   Hemoglobin 12.0 - 15.0 g/dL 8.5  7.0  7.1   Hematocrit 36.0 - 46.0 % 26.9  21.2  21.9   Platelets 150 - 400 K/uL 208  115  159       Latest Ref Rng & Units 07/20/2023    9:31 AM 07/06/2023    8:24 AM 06/29/2023    8:52 AM  CMP  Glucose 70 - 99 mg/dL 85  81  84   BUN 8 - 23 mg/dL 13  16  9    Creatinine 0.44 - 1.00 mg/dL 2.84  1.32  4.40   Sodium 135 - 145 mmol/L 138  136  132   Potassium 3.5 - 5.1 mmol/L 3.0  3.3  3.4   Chloride 98 - 111 mmol/L 107  106  104   CO2 22 - 32 mmol/L 22  22  22    Calcium 8.9 - 10.3 mg/dL 6.6  6.3  6.5   Total Protein 6.5 - 8.1 g/dL 5.2  5.6  5.8   Total Bilirubin <1.2 mg/dL 0.5  0.6  0.6   Alkaline Phos 38 - 126 U/L 249  244  240   AST 15 - 41 U/L 16  12  11    ALT 0 - 44 U/L 11  10  9       No results found  for: "CEA1", "CEA" / No results found for: "CEA1", "CEA" No results found for: "PSA1" No results found for: "NUU725" No results found for: "CAN125"  Lab Results  Component Value Date   TOTALPROTELP 5.6 (L) 07/06/2023   ALBUMINELP 2.2 (L) 07/06/2023   A1GS 0.4 07/06/2023   A2GS 0.7 07/06/2023   BETS 1.9 (H) 07/06/2023   GAMS 0.4 07/06/2023   MSPIKE 1.4 (H) 07/06/2023   SPEI Comment 07/06/2023   Lab Results  Component Value Date   TIBC NOT CALCULATED 06/16/2023   TIBC 111 (L) 05/15/2023   TIBC 184 (L) 01/26/2023   FERRITIN 811 (H) 06/16/2023   FERRITIN 559 (H) 05/15/2023   FERRITIN 101 01/26/2023   IRONPCTSAT NOT CALCULATED 06/16/2023   IRONPCTSAT 63 (H) 05/15/2023   IRONPCTSAT 36 (H) 01/26/2023   Lab Results  Component Value Date   LDH 238 (H) 05/14/2023   LDH 200 (H) 04/08/2023     STUDIES:   No results found.

## 2023-07-20 NOTE — Progress Notes (Signed)
Patient has been examined by Dr. Ellin Saba. Vital signs and labs have been reviewed by MD - ANC, Creatinine, LFTs, hemoglobin, and platelets are within treatment parameters per M.D. - pt may proceed with treatment. Corrected calcium is 8.3. Primary RN and pharmacy notified.

## 2023-07-28 ENCOUNTER — Encounter: Payer: Self-pay | Admitting: Hematology

## 2023-07-28 ENCOUNTER — Inpatient Hospital Stay: Payer: 59

## 2023-07-28 VITALS — BP 113/66 | HR 72 | Temp 97.2°F | Resp 18 | Wt 144.3 lb

## 2023-07-28 DIAGNOSIS — Z95828 Presence of other vascular implants and grafts: Secondary | ICD-10-CM

## 2023-07-28 DIAGNOSIS — Z5112 Encounter for antineoplastic immunotherapy: Secondary | ICD-10-CM | POA: Diagnosis not present

## 2023-07-28 DIAGNOSIS — C9000 Multiple myeloma not having achieved remission: Secondary | ICD-10-CM | POA: Diagnosis not present

## 2023-07-28 DIAGNOSIS — Z79899 Other long term (current) drug therapy: Secondary | ICD-10-CM | POA: Diagnosis not present

## 2023-07-28 LAB — COMPREHENSIVE METABOLIC PANEL
ALT: 12 U/L (ref 0–44)
AST: 20 U/L (ref 15–41)
Albumin: 1.9 g/dL — ABNORMAL LOW (ref 3.5–5.0)
Alkaline Phosphatase: 245 U/L — ABNORMAL HIGH (ref 38–126)
Anion gap: 7 (ref 5–15)
BUN: 13 mg/dL (ref 8–23)
CO2: 22 mmol/L (ref 22–32)
Calcium: 6 mg/dL — CL (ref 8.9–10.3)
Chloride: 108 mmol/L (ref 98–111)
Creatinine, Ser: 0.62 mg/dL (ref 0.44–1.00)
GFR, Estimated: >60 mL/min (ref >60)
Glucose, Bld: 81 mg/dL (ref 70–99)
Potassium: 3.1 mmol/L — ABNORMAL LOW (ref 3.5–5.1)
Sodium: 137 mmol/L (ref 135–145)
Total Bilirubin: 0.7 mg/dL (ref <1.2)
Total Protein: 5 g/dL — ABNORMAL LOW (ref 6.5–8.1)

## 2023-07-28 LAB — CBC WITH DIFFERENTIAL/PLATELET
Abs Immature Granulocytes: 0.02 10*3/uL (ref 0.00–0.07)
Basophils Absolute: 0 10*3/uL (ref 0.0–0.1)
Basophils Relative: 0 %
Eosinophils Absolute: 0.1 10*3/uL (ref 0.0–0.5)
Eosinophils Relative: 2 %
HCT: 26.9 % — ABNORMAL LOW (ref 36.0–46.0)
Hemoglobin: 8.9 g/dL — ABNORMAL LOW (ref 12.0–15.0)
Immature Granulocytes: 0 %
Lymphocytes Relative: 52 %
Lymphs Abs: 2.4 10*3/uL (ref 0.7–4.0)
MCH: 34.1 pg — ABNORMAL HIGH (ref 26.0–34.0)
MCHC: 33.1 g/dL (ref 30.0–36.0)
MCV: 103.1 fL — ABNORMAL HIGH (ref 80.0–100.0)
Monocytes Absolute: 0.5 10*3/uL (ref 0.1–1.0)
Monocytes Relative: 12 %
Neutro Abs: 1.6 10*3/uL — ABNORMAL LOW (ref 1.7–7.7)
Neutrophils Relative %: 34 %
Platelets: 155 10*3/uL (ref 150–400)
RBC: 2.61 MIL/uL — ABNORMAL LOW (ref 3.87–5.11)
RDW: 18.9 % — ABNORMAL HIGH (ref 11.5–15.5)
WBC: 4.6 10*3/uL (ref 4.0–10.5)
nRBC: 0 % (ref 0.0–0.2)

## 2023-07-28 LAB — MAGNESIUM: Magnesium: 1.7 mg/dL (ref 1.7–2.4)

## 2023-07-28 LAB — SAMPLE TO BLOOD BANK

## 2023-07-28 MED ORDER — DEXAMETHASONE 4 MG PO TABS
20.0000 mg | ORAL_TABLET | Freq: Once | ORAL | Status: AC
Start: 2023-07-28 — End: 2023-07-28
  Administered 2023-07-28: 20 mg via ORAL
  Filled 2023-07-28: qty 5

## 2023-07-28 MED ORDER — DARATUMUMAB-HYALURONIDASE-FIHJ 1800-30000 MG-UT/15ML ~~LOC~~ SOLN
1800.0000 mg | Freq: Once | SUBCUTANEOUS | Status: AC
  Administered 2023-07-28: 1800 mg via SUBCUTANEOUS
  Filled 2023-07-28: qty 15

## 2023-07-28 MED ORDER — HEPARIN SOD (PORK) LOCK FLUSH 100 UNIT/ML IV SOLN
500.0000 [IU] | Freq: Once | INTRAVENOUS | Status: AC
Start: 2023-07-28 — End: 2023-07-28
  Administered 2023-07-28: 500 [IU] via INTRAVENOUS

## 2023-07-28 MED ORDER — SODIUM CHLORIDE FLUSH 0.9 % IV SOLN
10.0000 mL | Freq: Once | INTRAVENOUS | Status: AC
  Administered 2023-07-28: 10 mL via INTRAVENOUS
  Filled 2023-07-28: qty 10

## 2023-07-28 MED ORDER — ACETAMINOPHEN 325 MG PO TABS
650.0000 mg | ORAL_TABLET | Freq: Once | ORAL | Status: AC
  Administered 2023-07-28: 650 mg via ORAL
  Filled 2023-07-28: qty 2

## 2023-07-28 NOTE — Patient Instructions (Signed)
 [facility] [place] - A DEPT OF [facility]  Discharge Instructions: Thank you for choosing [facility] to provide your oncology and hematology care.  If you have a lab appointment with the Cancer Center - please note that after April 8th, 2024, all labs will be drawn in the cancer center.  You do not have to check in or register with the main entrance as you have in the past but will complete your check-in in the cancer center.  Wear comfortable clothing and clothing appropriate for easy access to any Portacath or PICC line.   We strive to give you quality time with your provider. You may need to reschedule your appointment if you arrive late (15 or more minutes).  Arriving late affects you and other patients whose appointments are after yours.  Also, if you miss three or more appointments without notifying the office, you may be dismissed from the clinic at the provider's discretion.      For prescription refill requests, have your pharmacy contact our office and allow 72 hours for refills to be completed.    Today you received the following chemotherapy and/or immunotherapy agents daratumumab.       To help prevent nausea and vomiting after your treatment, we encourage you to take your nausea medication as directed.  BELOW ARE SYMPTOMS THAT SHOULD BE REPORTED IMMEDIATELY: *FEVER GREATER THAN 100.4 F (38 C) OR HIGHER *CHILLS OR SWEATING *NAUSEA AND VOMITING THAT IS NOT CONTROLLED WITH YOUR NAUSEA MEDICATION *UNUSUAL SHORTNESS OF BREATH *UNUSUAL BRUISING OR BLEEDING *URINARY PROBLEMS (pain or burning when urinating, or frequent urination) *BOWEL PROBLEMS (unusual diarrhea, constipation, pain near the anus) TENDERNESS IN MOUTH AND THROAT WITH OR WITHOUT PRESENCE OF ULCERS (sore throat, sores in mouth, or a toothache) UNUSUAL RASH, SWELLING OR PAIN  UNUSUAL VAGINAL DISCHARGE OR ITCHING   Items with * indicate a potential emergency and should be  followed up as soon as possible or go to the Emergency Department if any problems should occur.  Please show the CHEMOTHERAPY ALERT CARD or IMMUNOTHERAPY ALERT CARD at check-in to the Emergency Department and triage nurse.  Should you have questions after your visit or need to cancel or reschedule your appointment, please contact [facility] [place] - A DEPT OF [facility]igh[facility] [phone]  and follow the prompts.  Office hours are 8:00 a.m. to 4:30 p.m. Monday - Friday. Please note that voicemails left after 4:00 p.m. may not be returned until the following business day.  We are closed weekends and major holidays. You have access to a nurse at all times for urgent questions. Please call the main number to the clinic [phone] and follow the prompts.  For any non-urgent questions, you may also contact your provider using MyChart. We now offer e-Visits for anyone 52 and older to request care online for non-urgent symptoms. For details visit [email or web address].   Also download the MyChart app! Go to the app store, search "MyChart", open the app, select , and log in with your MyChart username and password.

## 2023-07-28 NOTE — Progress Notes (Signed)
Patient tolerated Daratumumab injection with no complaints voiced.  See MAR for details.  Labs reviewed. Injection site clean and dry with no bruising or swelling noted at site.  Band aid applied.  Vss with discharge and left in satisfactory condition with no s/s of distress noted.

## 2023-07-28 NOTE — Progress Notes (Signed)
CRITICAL VALUE ALERT Critical value received:  Calcium 6.0 Date of notification:  07-28-23 Time of notification: 0856 Critical value read back:  Yes.   Nurse who received alert:  C.Pollyanna Levay RN MD notified time and response:  0857, Dr. Ellin Saba , corrected calcium per MD is 7.68, no new orders at this time.

## 2023-08-02 DIAGNOSIS — C9000 Multiple myeloma not having achieved remission: Secondary | ICD-10-CM | POA: Diagnosis not present

## 2023-08-02 DIAGNOSIS — M069 Rheumatoid arthritis, unspecified: Secondary | ICD-10-CM | POA: Diagnosis not present

## 2023-08-02 DIAGNOSIS — K219 Gastro-esophageal reflux disease without esophagitis: Secondary | ICD-10-CM | POA: Diagnosis not present

## 2023-08-02 DIAGNOSIS — D630 Anemia in neoplastic disease: Secondary | ICD-10-CM | POA: Diagnosis not present

## 2023-08-02 DIAGNOSIS — R1310 Dysphagia, unspecified: Secondary | ICD-10-CM | POA: Diagnosis not present

## 2023-08-04 ENCOUNTER — Encounter: Payer: Self-pay | Admitting: Hematology

## 2023-08-04 DIAGNOSIS — R262 Difficulty in walking, not elsewhere classified: Secondary | ICD-10-CM | POA: Diagnosis not present

## 2023-08-04 DIAGNOSIS — M6281 Muscle weakness (generalized): Secondary | ICD-10-CM | POA: Diagnosis not present

## 2023-08-04 DIAGNOSIS — M5459 Other low back pain: Secondary | ICD-10-CM | POA: Diagnosis not present

## 2023-08-05 DIAGNOSIS — M5459 Other low back pain: Secondary | ICD-10-CM | POA: Diagnosis not present

## 2023-08-05 DIAGNOSIS — M6281 Muscle weakness (generalized): Secondary | ICD-10-CM | POA: Diagnosis not present

## 2023-08-05 DIAGNOSIS — R262 Difficulty in walking, not elsewhere classified: Secondary | ICD-10-CM | POA: Diagnosis not present

## 2023-08-08 DIAGNOSIS — R262 Difficulty in walking, not elsewhere classified: Secondary | ICD-10-CM | POA: Diagnosis not present

## 2023-08-08 DIAGNOSIS — M5459 Other low back pain: Secondary | ICD-10-CM | POA: Diagnosis not present

## 2023-08-08 DIAGNOSIS — M6281 Muscle weakness (generalized): Secondary | ICD-10-CM | POA: Diagnosis not present

## 2023-08-09 DIAGNOSIS — R262 Difficulty in walking, not elsewhere classified: Secondary | ICD-10-CM | POA: Diagnosis not present

## 2023-08-09 DIAGNOSIS — C73 Malignant neoplasm of thyroid gland: Secondary | ICD-10-CM | POA: Diagnosis not present

## 2023-08-09 DIAGNOSIS — M5459 Other low back pain: Secondary | ICD-10-CM | POA: Diagnosis not present

## 2023-08-09 DIAGNOSIS — M6281 Muscle weakness (generalized): Secondary | ICD-10-CM | POA: Diagnosis not present

## 2023-08-10 DIAGNOSIS — M5459 Other low back pain: Secondary | ICD-10-CM | POA: Diagnosis not present

## 2023-08-10 DIAGNOSIS — M6281 Muscle weakness (generalized): Secondary | ICD-10-CM | POA: Diagnosis not present

## 2023-08-10 DIAGNOSIS — R262 Difficulty in walking, not elsewhere classified: Secondary | ICD-10-CM | POA: Diagnosis not present

## 2023-08-11 ENCOUNTER — Other Ambulatory Visit: Payer: Self-pay

## 2023-08-11 ENCOUNTER — Inpatient Hospital Stay: Payer: 59 | Attending: Hematology

## 2023-08-11 ENCOUNTER — Inpatient Hospital Stay: Payer: 59

## 2023-08-11 ENCOUNTER — Encounter: Payer: Self-pay | Admitting: Hematology

## 2023-08-11 VITALS — BP 100/56 | HR 83 | Wt 147.6 lb

## 2023-08-11 DIAGNOSIS — C9000 Multiple myeloma not having achieved remission: Secondary | ICD-10-CM | POA: Diagnosis not present

## 2023-08-11 DIAGNOSIS — Z5112 Encounter for antineoplastic immunotherapy: Secondary | ICD-10-CM | POA: Insufficient documentation

## 2023-08-11 DIAGNOSIS — E876 Hypokalemia: Secondary | ICD-10-CM

## 2023-08-11 LAB — CBC WITH DIFFERENTIAL/PLATELET
Abs Immature Granulocytes: 0.01 10*3/uL (ref 0.00–0.07)
Basophils Absolute: 0 10*3/uL (ref 0.0–0.1)
Basophils Relative: 0 %
Eosinophils Absolute: 0 10*3/uL (ref 0.0–0.5)
Eosinophils Relative: 0 %
HCT: 26.8 % — ABNORMAL LOW (ref 36.0–46.0)
Hemoglobin: 9.1 g/dL — ABNORMAL LOW (ref 12.0–15.0)
Immature Granulocytes: 0 %
Lymphocytes Relative: 40 %
Lymphs Abs: 1.6 10*3/uL (ref 0.7–4.0)
MCH: 34.2 pg — ABNORMAL HIGH (ref 26.0–34.0)
MCHC: 34 g/dL (ref 30.0–36.0)
MCV: 100.8 fL — ABNORMAL HIGH (ref 80.0–100.0)
Monocytes Absolute: 0.5 10*3/uL (ref 0.1–1.0)
Monocytes Relative: 13 %
Neutro Abs: 1.8 10*3/uL (ref 1.7–7.7)
Neutrophils Relative %: 47 %
Platelets: 182 10*3/uL (ref 150–400)
RBC: 2.66 MIL/uL — ABNORMAL LOW (ref 3.87–5.11)
RDW: 18.6 % — ABNORMAL HIGH (ref 11.5–15.5)
WBC: 4 10*3/uL (ref 4.0–10.5)
nRBC: 0 % (ref 0.0–0.2)

## 2023-08-11 LAB — COMPREHENSIVE METABOLIC PANEL
ALT: 19 U/L (ref 0–44)
AST: 34 U/L (ref 15–41)
Albumin: 1.8 g/dL — ABNORMAL LOW (ref 3.5–5.0)
Alkaline Phosphatase: 216 U/L — ABNORMAL HIGH (ref 38–126)
Anion gap: 9 (ref 5–15)
BUN: 11 mg/dL (ref 8–23)
CO2: 23 mmol/L (ref 22–32)
Calcium: 6.7 mg/dL — ABNORMAL LOW (ref 8.9–10.3)
Chloride: 104 mmol/L (ref 98–111)
Creatinine, Ser: 0.63 mg/dL (ref 0.44–1.00)
GFR, Estimated: >60 mL/min (ref >60)
Glucose, Bld: 91 mg/dL (ref 70–99)
Potassium: 2.7 mmol/L — CL (ref 3.5–5.1)
Sodium: 136 mmol/L (ref 135–145)
Total Bilirubin: 0.8 mg/dL (ref 0.0–1.2)
Total Protein: 5 g/dL — ABNORMAL LOW (ref 6.5–8.1)

## 2023-08-11 LAB — SAMPLE TO BLOOD BANK

## 2023-08-11 MED ORDER — DEXAMETHASONE 4 MG PO TABS
20.0000 mg | ORAL_TABLET | Freq: Once | ORAL | Status: AC
  Administered 2023-08-11: 20 mg via ORAL
  Filled 2023-08-11: qty 5

## 2023-08-11 MED ORDER — DARATUMUMAB-HYALURONIDASE-FIHJ 1800-30000 MG-UT/15ML ~~LOC~~ SOLN
1800.0000 mg | Freq: Once | SUBCUTANEOUS | Status: AC
  Administered 2023-08-11: 1800 mg via SUBCUTANEOUS
  Filled 2023-08-11: qty 15

## 2023-08-11 MED ORDER — POTASSIUM CHLORIDE IN NACL 20-0.9 MEQ/L-% IV SOLN
Freq: Once | INTRAVENOUS | Status: AC
Start: 2023-08-11 — End: 2023-08-11
  Filled 2023-08-11: qty 1000

## 2023-08-11 MED ORDER — LENALIDOMIDE 10 MG PO CAPS: 10.0000 mg | ORAL_CAPSULE | Freq: Every day | ORAL | 0 refills | Status: DC

## 2023-08-11 MED ORDER — ACETAMINOPHEN 325 MG PO TABS
650.0000 mg | ORAL_TABLET | Freq: Once | ORAL | Status: AC
  Administered 2023-08-11: 650 mg via ORAL
  Filled 2023-08-11: qty 2

## 2023-08-11 MED ORDER — POTASSIUM CHLORIDE CRYS ER 20 MEQ PO TBCR
40.0000 meq | EXTENDED_RELEASE_TABLET | Freq: Once | ORAL | Status: AC
Start: 2023-08-11 — End: 2023-08-11
  Administered 2023-08-11: 40 meq via ORAL
  Filled 2023-08-11: qty 2

## 2023-08-11 MED ORDER — SODIUM CHLORIDE 0.9% FLUSH
10.0000 mL | INTRAVENOUS | Status: DC | PRN
  Administered 2023-08-11: 10 mL via INTRAVENOUS

## 2023-08-11 MED ORDER — POTASSIUM CHLORIDE 20 MEQ/100ML IV SOLN
20.0000 meq | Freq: Once | INTRAVENOUS | Status: DC
  Filled 2023-08-11: qty 100

## 2023-08-11 MED ORDER — POTASSIUM CHLORIDE CRYS ER 20 MEQ PO TBCR
40.0000 meq | EXTENDED_RELEASE_TABLET | Freq: Once | ORAL | Status: AC
  Administered 2023-08-11: 40 meq via ORAL
  Filled 2023-08-11: qty 2

## 2023-08-11 MED ORDER — HEPARIN SOD (PORK) LOCK FLUSH 100 UNIT/ML IV SOLN
500.0000 [IU] | Freq: Once | INTRAVENOUS | Status: AC
  Administered 2023-08-11: 500 [IU] via INTRAVENOUS

## 2023-08-11 NOTE — Progress Notes (Signed)
 Labs reviewed today , ok to treat per parameters and per MD. K+ is 2.1, MD aware and will give K+ per standing orders.   CRITICAL VALUE ALERT Critical value received:  K+ 2.7 Date of notification:  08-11-23 Time of notification: 1022 Critical value read back:  Yes.   Nurse who received alert:  C.pageRN MD notified time and response:  Dr. Katragadda, proceed with standing orders 1023

## 2023-08-11 NOTE — Patient Instructions (Signed)
 [facility] [place] [facility]  Discharge Instructions: Thank you for choosing [facility] to provide your oncology and hematology care.  If you have a lab appointment with the Cancer Center - please note that after April 8th, 2024, all labs will be drawn in the cancer center.  You do not have to check in or register with the main entrance as you have in the past but will complete your check-in in the cancer center.  Wear comfortable clothing and clothing appropriate for easy access to any Portacath or PICC line.   We strive to give you quality time with your provider. You may need to reschedule your appointment if you arrive late (15 or more minutes).  Arriving late affects you and other patients whose appointments are after yours.  Also, if you miss three or more appointments without notifying the office, you may be dismissed from the clinic at the provider's discretion.      For prescription refill requests, have your pharmacy contact our office and allow 72 hours for refills to be completed.    Today you received the following chemotherapy and/or immunotherapy agents darzalex    To help prevent nausea and vomiting after your treatment, we encourage you to take your nausea medication as directed.  BELOW ARE SYMPTOMS THAT SHOULD BE REPORTED IMMEDIATELY: *FEVER GREATER THAN 100.4 F (38 C) OR HIGHER *CHILLS OR SWEATING *NAUSEA AND VOMITING THAT IS NOT CONTROLLED WITH YOUR NAUSEA MEDICATION *UNUSUAL SHORTNESS OF BREATH *UNUSUAL BRUISING OR BLEEDING *URINARY PROBLEMS (pain or burning when urinating, or frequent urination) *BOWEL PROBLEMS (unusual diarrhea, constipation, pain near the anus) TENDERNESS IN MOUTH AND THROAT WITH OR WITHOUT PRESENCE OF ULCERS (sore throat, sores in mouth, or a toothache) UNUSUAL RASH, SWELLING OR PAIN  UNUSUAL VAGINAL DISCHARGE OR ITCHING   Items with * indicate a potential emergency and should be followed up as  soon as possible or go to the Emergency Department if any problems should occur.  Please show the CHEMOTHERAPY ALERT CARD or IMMUNOTHERAPY ALERT CARD at check-in to the Emergency Department and triage nurse.  Should you have questions after your visit or need to cancel or reschedule your appointment, please contact [facility] [place] - A DEPT OF Eligh[facility] Brideg[facility] [phone]  and follow the prompts.  Office hours are 8:00 a.m. to 4:30 p.m. Monday - Friday. Please note that voicemails left after 4:00 p.m. may not be returned until the following business day.  We are closed weekends and major holidays. You have access to a nurse at all times for urgent questions. Please call the main number to the clinic [phone] and follow the prompts.  For any non-urgent questions, you may also contact your provider using MyChart. We now offer e-Visits for anyone 2 and older to request care online for non-urgent symptoms. For details visit [email or web address].   Also download the MyChart app! Go to the app store, search "MyChart", open the app, select [place], and log in with your MyChart username and password.

## 2023-08-11 NOTE — Progress Notes (Signed)
 Potassium given per standing orders. Pt tolerated potassium well with no complications. See MAR for administration details. VSS.   Patient tolerated Darzalex  injection with no complaints voiced.  Site clean and dry with no bruising or swelling noted at site.  See MAR for details.  Band aid applied.  Patient stable during and after injection.  Vss with discharge and left in satisfactory condition with no s/s of distress noted. All follow ups as scheduled.   Kathy [name]

## 2023-08-11 NOTE — Telephone Encounter (Signed)
 Chart reviewed. Revlimid refilled per verbal order from Dr. Ellin Saba.

## 2023-08-12 DIAGNOSIS — M5459 Other low back pain: Secondary | ICD-10-CM | POA: Diagnosis not present

## 2023-08-12 DIAGNOSIS — M6281 Muscle weakness (generalized): Secondary | ICD-10-CM | POA: Diagnosis not present

## 2023-08-12 DIAGNOSIS — R262 Difficulty in walking, not elsewhere classified: Secondary | ICD-10-CM | POA: Diagnosis not present

## 2023-08-15 DIAGNOSIS — M5459 Other low back pain: Secondary | ICD-10-CM | POA: Diagnosis not present

## 2023-08-15 DIAGNOSIS — R262 Difficulty in walking, not elsewhere classified: Secondary | ICD-10-CM | POA: Diagnosis not present

## 2023-08-15 DIAGNOSIS — M6281 Muscle weakness (generalized): Secondary | ICD-10-CM | POA: Diagnosis not present

## 2023-08-16 DIAGNOSIS — I959 Hypotension, unspecified: Secondary | ICD-10-CM | POA: Diagnosis not present

## 2023-08-16 DIAGNOSIS — E039 Hypothyroidism, unspecified: Secondary | ICD-10-CM | POA: Diagnosis not present

## 2023-08-16 DIAGNOSIS — N3281 Overactive bladder: Secondary | ICD-10-CM | POA: Diagnosis not present

## 2023-08-16 DIAGNOSIS — K219 Gastro-esophageal reflux disease without esophagitis: Secondary | ICD-10-CM | POA: Diagnosis not present

## 2023-08-17 DIAGNOSIS — M6281 Muscle weakness (generalized): Secondary | ICD-10-CM | POA: Diagnosis not present

## 2023-08-17 DIAGNOSIS — M5459 Other low back pain: Secondary | ICD-10-CM | POA: Diagnosis not present

## 2023-08-17 DIAGNOSIS — R262 Difficulty in walking, not elsewhere classified: Secondary | ICD-10-CM | POA: Diagnosis not present

## 2023-08-18 ENCOUNTER — Encounter: Payer: Self-pay | Admitting: Hematology

## 2023-08-23 ENCOUNTER — Other Ambulatory Visit: Payer: Self-pay

## 2023-08-24 DIAGNOSIS — I10 Essential (primary) hypertension: Secondary | ICD-10-CM | POA: Diagnosis not present

## 2023-08-24 DIAGNOSIS — H269 Unspecified cataract: Secondary | ICD-10-CM | POA: Diagnosis not present

## 2023-08-25 ENCOUNTER — Inpatient Hospital Stay: Payer: 59

## 2023-08-25 ENCOUNTER — Telehealth: Payer: Self-pay

## 2023-08-25 DIAGNOSIS — C9000 Multiple myeloma not having achieved remission: Secondary | ICD-10-CM

## 2023-08-25 DIAGNOSIS — Z5112 Encounter for antineoplastic immunotherapy: Secondary | ICD-10-CM | POA: Diagnosis not present

## 2023-08-25 DIAGNOSIS — E876 Hypokalemia: Secondary | ICD-10-CM

## 2023-08-25 LAB — CBC WITH DIFFERENTIAL/PLATELET
Abs Immature Granulocytes: 0.02 10*3/uL (ref 0.00–0.07)
Basophils Absolute: 0 10*3/uL (ref 0.0–0.1)
Basophils Relative: 0 %
Eosinophils Absolute: 0 10*3/uL (ref 0.0–0.5)
Eosinophils Relative: 0 %
HCT: 27.2 % — ABNORMAL LOW (ref 36.0–46.0)
Hemoglobin: 9.1 g/dL — ABNORMAL LOW (ref 12.0–15.0)
Immature Granulocytes: 0 %
Lymphocytes Relative: 44 %
Lymphs Abs: 2.4 10*3/uL (ref 0.7–4.0)
MCH: 33.7 pg (ref 26.0–34.0)
MCHC: 33.5 g/dL (ref 30.0–36.0)
MCV: 100.7 fL — ABNORMAL HIGH (ref 80.0–100.0)
Monocytes Absolute: 0.6 10*3/uL (ref 0.1–1.0)
Monocytes Relative: 12 %
Neutro Abs: 2.4 10*3/uL (ref 1.7–7.7)
Neutrophils Relative %: 44 %
Platelets: 217 10*3/uL (ref 150–400)
RBC: 2.7 MIL/uL — ABNORMAL LOW (ref 3.87–5.11)
RDW: 17.6 % — ABNORMAL HIGH (ref 11.5–15.5)
WBC: 5.5 10*3/uL (ref 4.0–10.5)
nRBC: 0 % (ref 0.0–0.2)

## 2023-08-25 LAB — COMPREHENSIVE METABOLIC PANEL
ALT: 25 U/L (ref 0–44)
AST: 32 U/L (ref 15–41)
Albumin: 1.7 g/dL — ABNORMAL LOW (ref 3.5–5.0)
Alkaline Phosphatase: 191 U/L — ABNORMAL HIGH (ref 38–126)
Anion gap: 8 (ref 5–15)
BUN: 13 mg/dL (ref 8–23)
CO2: 23 mmol/L (ref 22–32)
Calcium: 7 mg/dL — ABNORMAL LOW (ref 8.9–10.3)
Chloride: 108 mmol/L (ref 98–111)
Creatinine, Ser: 0.59 mg/dL (ref 0.44–1.00)
GFR, Estimated: >60 mL/min (ref >60)
Glucose, Bld: 98 mg/dL (ref 70–99)
Potassium: 2.7 mmol/L — CL (ref 3.5–5.1)
Sodium: 139 mmol/L (ref 135–145)
Total Bilirubin: 0.7 mg/dL (ref 0.0–1.2)
Total Protein: 4.7 g/dL — ABNORMAL LOW (ref 6.5–8.1)

## 2023-08-25 LAB — SAMPLE TO BLOOD BANK

## 2023-08-25 MED ORDER — POTASSIUM CHLORIDE 20 MEQ/100ML IV SOLN
20.0000 meq | Freq: Once | INTRAVENOUS | Status: AC
  Administered 2023-08-25: 20 meq via INTRAVENOUS
  Filled 2023-08-25: qty 100

## 2023-08-25 MED ORDER — HEPARIN SOD (PORK) LOCK FLUSH 100 UNIT/ML IV SOLN
500.0000 [IU] | Freq: Once | INTRAVENOUS | Status: AC
  Administered 2023-08-25: 500 [IU] via INTRAVENOUS

## 2023-08-25 MED ORDER — POTASSIUM CHLORIDE CRYS ER 20 MEQ PO TBCR
40.0000 meq | EXTENDED_RELEASE_TABLET | ORAL | Status: AC
  Administered 2023-08-25 (×2): 40 meq via ORAL
  Filled 2023-08-25: qty 2

## 2023-08-25 MED ORDER — DARATUMUMAB-HYALURONIDASE-FIHJ 1800-30000 MG-UT/15ML ~~LOC~~ SOLN
1800.0000 mg | Freq: Once | SUBCUTANEOUS | Status: AC
Start: 2023-08-25 — End: 2023-08-25
  Administered 2023-08-25: 1800 mg via SUBCUTANEOUS
  Filled 2023-08-25: qty 15

## 2023-08-25 MED ORDER — ACETAMINOPHEN 325 MG PO TABS
650.0000 mg | ORAL_TABLET | Freq: Once | ORAL | Status: AC
  Administered 2023-08-25: 650 mg via ORAL
  Filled 2023-08-25: qty 2

## 2023-08-25 MED ORDER — SODIUM CHLORIDE 0.9% FLUSH
10.0000 mL | INTRAVENOUS | Status: DC | PRN
  Administered 2023-08-25 (×2): 10 mL via INTRAVENOUS

## 2023-08-25 MED ORDER — SODIUM CHLORIDE 0.9 % IV SOLN: INTRAVENOUS | Status: DC

## 2023-08-25 MED ORDER — DEXAMETHASONE 4 MG PO TABS
20.0000 mg | ORAL_TABLET | Freq: Once | ORAL | Status: AC
  Administered 2023-08-25: 20 mg via ORAL
  Filled 2023-08-25: qty 5

## 2023-08-25 NOTE — Progress Notes (Signed)
Patient presents today for Daratumumab injection per providers order.  Vital signs WNL.  Labs pending.  Patient has no new complaints at this time.  Labs within parameters for treatment.  Potassium noted to be 2.7, patient will receive Potassium 40 mEq PO, 20 mEq IV, 40 mEq PO.  Treatment given today per MD orders.  Stable during infusion and injection without adverse affects.  Vital signs stable.  No complaints at this time.  Discharge from clinic via wheelchair in stable condition.  Alert and oriented X 3.  Follow up with [facility] as scheduled.

## 2023-08-25 NOTE — Patient Instructions (Signed)
[facility] [place] [facility]  Discharge Instructions: Thank you for choosing [facility] to provide your oncology and hematology care.  If you have a lab appointment with the Cancer Center - please note that after April 8th, 2024, all labs will be drawn in the cancer center.  You do not have to check in or register with the main entrance as you have in the past but will complete your check-in in the cancer center.  Wear comfortable clothing and clothing appropriate for easy access to any Portacath or PICC line.   We strive to give you quality time with your provider. You may need to reschedule your appointment if you arrive late (15 or more minutes).  Arriving late affects you and other patients whose appointments are after yours.  Also, if you miss three or more appointments without notifying the office, you may be dismissed from the clinic at the provider's discretion.      For prescription refill requests, have your pharmacy contact our office and allow 72 hours for refills to be completed.    Today you received the following chemotherapy and/or immunotherapy agents daratumumab and potassium      To help prevent nausea and vomiting after your treatment, we encourage you to take your nausea medication as directed.  BELOW ARE SYMPTOMS THAT SHOULD BE REPORTED IMMEDIATELY: *FEVER GREATER THAN 100.4 F (38 C) OR HIGHER *CHILLS OR SWEATING *NAUSEA AND VOMITING THAT IS NOT CONTROLLED WITH YOUR NAUSEA MEDICATION *UNUSUAL SHORTNESS OF BREATH *UNUSUAL BRUISING OR BLEEDING *URINARY PROBLEMS (pain or burning when urinating, or frequent urination) *BOWEL PROBLEMS (unusual diarrhea, constipation, pain near the anus) TENDERNESS IN MOUTH AND THROAT WITH OR WITHOUT PRESENCE OF ULCERS (sore throat, sores in mouth, or a toothache) UNUSUAL RASH, SWELLING OR PAIN  UNUSUAL VAGINAL DISCHARGE OR ITCHING   Items with * indicate a potential emergency and  should be followed up as soon as possible or go to the Emergency Department if any problems should occur.  Please show the CHEMOTHERAPY ALERT CARD or IMMUNOTHERAPY ALERT CARD at check-in to the Emergency Department and triage nurse.  Should you have questions after your visit or need to cancel or reschedule your appointment, please contact [facility] [place] - A DEPT OF [facility] [phone]  and follow the prompts.  Office hours are 8:00 a.m. to 4:30 p.m. Monday - Friday. Please note that voicemails left after 4:00 p.m. may not be returned until the following business day.  We are closed weekends and major holidays. You have access to a nurse at all times for urgent questions. Please call the main number to the clinic [phone] and follow the prompts.  For any non-urgent questions, you may also contact your provider using MyChart. We now offer e-Visits for anyone 57 and older to request care online for non-urgent symptoms. For details visit [email or web address].   Also download the MyChart app! Go to the app store, search "MyChart", open the app, select [place], and log in with your MyChart username and password.

## 2023-08-25 NOTE — Progress Notes (Signed)
CRITICAL VALUE ALERT Critical value received:  K+ 2.7 Date of notification:  08-24-22 Time of notification: 0934 Critical value read back:  Yes.   Nurse who received alert:  C. PageRN MD notified time and response: 0935, Dr. Ellin Saba. Will do standing orders for K+

## 2023-08-25 NOTE — Telephone Encounter (Signed)
Received notification from [facility] that they needed to speak with someone at [facility], where Ms. Sinor resides, to confirm her residency status at [employer] so that they can bill her insurance and ship medication. [facility] has attempted to reach [employer] 3 times with no answer or return calls. I called [facility] at [phone] and left a voicemail with [employer]'s call back number of [phone] and requested that they call [employer] to confirm status and get patient's medication shipped to them.    Ardeen Fillers, CPhT Oncology Pharmacy Patient Advocate  [facility]  [phone] (phone) [phone] (fax) 08/25/2023 3:22 PM

## 2023-08-26 DIAGNOSIS — M6281 Muscle weakness (generalized): Secondary | ICD-10-CM | POA: Diagnosis not present

## 2023-08-26 DIAGNOSIS — M5459 Other low back pain: Secondary | ICD-10-CM | POA: Diagnosis not present

## 2023-08-26 DIAGNOSIS — R262 Difficulty in walking, not elsewhere classified: Secondary | ICD-10-CM | POA: Diagnosis not present

## 2023-08-29 DIAGNOSIS — M5459 Other low back pain: Secondary | ICD-10-CM | POA: Diagnosis not present

## 2023-08-29 DIAGNOSIS — R262 Difficulty in walking, not elsewhere classified: Secondary | ICD-10-CM | POA: Diagnosis not present

## 2023-08-29 DIAGNOSIS — M6281 Muscle weakness (generalized): Secondary | ICD-10-CM | POA: Diagnosis not present

## 2023-08-30 DIAGNOSIS — R262 Difficulty in walking, not elsewhere classified: Secondary | ICD-10-CM | POA: Diagnosis not present

## 2023-08-30 DIAGNOSIS — M6281 Muscle weakness (generalized): Secondary | ICD-10-CM | POA: Diagnosis not present

## 2023-08-30 DIAGNOSIS — M5459 Other low back pain: Secondary | ICD-10-CM | POA: Diagnosis not present

## 2023-08-31 DIAGNOSIS — C9000 Multiple myeloma not having achieved remission: Secondary | ICD-10-CM | POA: Diagnosis not present

## 2023-08-31 DIAGNOSIS — E039 Hypothyroidism, unspecified: Secondary | ICD-10-CM | POA: Diagnosis not present

## 2023-08-31 DIAGNOSIS — K5900 Constipation, unspecified: Secondary | ICD-10-CM | POA: Diagnosis not present

## 2023-08-31 DIAGNOSIS — I959 Hypotension, unspecified: Secondary | ICD-10-CM | POA: Diagnosis not present

## 2023-09-02 DIAGNOSIS — M5459 Other low back pain: Secondary | ICD-10-CM | POA: Diagnosis not present

## 2023-09-02 DIAGNOSIS — M6281 Muscle weakness (generalized): Secondary | ICD-10-CM | POA: Diagnosis not present

## 2023-09-02 DIAGNOSIS — R262 Difficulty in walking, not elsewhere classified: Secondary | ICD-10-CM | POA: Diagnosis not present

## 2023-09-05 DIAGNOSIS — M6281 Muscle weakness (generalized): Secondary | ICD-10-CM | POA: Diagnosis not present

## 2023-09-05 DIAGNOSIS — R262 Difficulty in walking, not elsewhere classified: Secondary | ICD-10-CM | POA: Diagnosis not present

## 2023-09-05 DIAGNOSIS — M5459 Other low back pain: Secondary | ICD-10-CM | POA: Diagnosis not present

## 2023-09-06 ENCOUNTER — Other Ambulatory Visit: Payer: Self-pay

## 2023-09-06 DIAGNOSIS — R262 Difficulty in walking, not elsewhere classified: Secondary | ICD-10-CM | POA: Diagnosis not present

## 2023-09-06 DIAGNOSIS — M6281 Muscle weakness (generalized): Secondary | ICD-10-CM | POA: Diagnosis not present

## 2023-09-06 DIAGNOSIS — M5459 Other low back pain: Secondary | ICD-10-CM | POA: Diagnosis not present

## 2023-09-07 NOTE — Progress Notes (Incomplete)
   [facility] [address] [place] [address]    Clinic Day:  09/07/2023  Referring physician: Meldon Sport, MD  Patient Care Team: Meldon Sport, MD as PCP - General (Internal Medicine) Amanda Jungling Joyceann No, MD as PCP - Ca

## 2023-09-08 ENCOUNTER — Inpatient Hospital Stay: Payer: 59

## 2023-09-08 ENCOUNTER — Inpatient Hospital Stay: Payer: 59 | Admitting: Hematology

## 2023-09-16 ENCOUNTER — Other Ambulatory Visit: Payer: Self-pay

## 2023-09-16 MED ORDER — LENALIDOMIDE 10 MG PO CAPS: 10.0000 mg | ORAL_CAPSULE | Freq: Every day | ORAL | 0 refills | Status: DC

## 2023-09-16 NOTE — Telephone Encounter (Signed)
Chart reviewed. Revlimid refilled per verbal order from Dr. Ellin Saba.

## 2023-09-19 ENCOUNTER — Ambulatory Visit: Payer: 59

## 2023-09-20 ENCOUNTER — Other Ambulatory Visit: Payer: Self-pay

## 2023-09-21 DIAGNOSIS — I4891 Unspecified atrial fibrillation: Secondary | ICD-10-CM | POA: Diagnosis not present

## 2023-09-21 DIAGNOSIS — R945 Abnormal results of liver function studies: Secondary | ICD-10-CM | POA: Diagnosis not present

## 2023-09-21 DIAGNOSIS — D649 Anemia, unspecified: Secondary | ICD-10-CM | POA: Diagnosis not present

## 2023-09-21 NOTE — Progress Notes (Signed)
 [facility] [address], [place] [address]    Clinic Day:  09/22/2023  Referring physician: [name], MD  Patient Care Team: [name], MD as PCP - General (Internal Medicine) Wyline Mood Dorothe Pea, MD as PCP - Cardiology (Cardiology) Dorisann Frames, MD as Referring Physician (Endocrinology) Doreatha Massed, MD as Medical Oncologist (Medical Oncology) Therese Sarah, RN as Oncology Nurse Navigator (Medical Oncology)   ASSESSMENT & PLAN:   Assessment: 1.  IgG kappa multiple myeloma: - Presentation with malignant hypercalcemia since 01/23/2023 - 04/08/2023: M spike-5.1 g, kappa light chains 872, lambda light chains 4.1, ratio 212.68.  Beta-2 microglobulin 3.6.  LDH 200. - BMBX (04/08/2023): Lambda restricted plasma cells involving approximately 50% of the cellular marrow. - Cytogenetics: 46, XX[20] - Darzalex cycle 1 day 1 started on 04/26/2023.  Revlimid 10 mg started on 06/09/2023 - Hospitalization from 05/11/2023 through 05/18/2023 with acute kidney injury, cystitis from E. coli.   2.  Social/family history: - Prior to recent hospitalization, she was living by herself at home.  She is currently resident at [facility] in [place].  Her 2 brothers live in [place].  She is a retired Engineer, site.  Non-smoker.  1 brother had thyroid cancer.  Another brother had prostate cancer.  Father had lung cancer.    Plan: 1.  IgG kappa multiple myeloma: - She is tolerating Darzalex monthly very well. - It is not clear if she is getting Revlimid at the nursing home. - She has gained 5 pounds since last visit.  She also has 2+ edema which is stable.  She reports that she is no longer getting rehab. - Reviewed labs today: Normal LFTs.  CBC grossly normal with white count 3.3 and ANC of 0.8. - We discussed that if she we will stay at [facility] nursing home for long-term, we will refer her to medical oncology in [place]. - She will  proceed with Darzalex today.  RTC 4 weeks for follow-up.   2.  ID prophylaxis: - Continue acyclovir and aspirin.   3.  Hypoalbuminemia: - Likely from combination of myeloma and malnutrition.  Albumin today is 1.8.  She has 2+ edema.   4.  Hypokalemia: - Potassium is 2.4.  She is not on potassium supplements when I reviewed the medication list from the nursing home.  Will start her on potassium 20 mEq 3 times daily.  She will have potassium repletion done in the office today.   5.  Hypomagnesemia: - Continue magnesium daily.   6.  Low back pain: - Continue tramadol as needed.    No orders of the defined types were placed in this encounter.     Alben Deeds Teague,acting as a Neurosurgeon for Doreatha Massed, MD.,have documented all relevant documentation on the behalf of Doreatha Massed, MD,as directed by  Doreatha Massed, MD while in the presence of Doreatha Massed, MD.  I, Doreatha Massed MD, have reviewed the above documentation for accuracy and completeness, and I agree with the above.    Doreatha Massed, MD   2/20/20251:51 PM  CHIEF COMPLAINT:   Diagnosis: Multiple myeloma    Cancer Staging  Multiple myeloma (HCC) Staging form: Plasma Cell Myeloma and Plasma Cell Disorders, AJCC 8th Edition - Clinical stage from 04/14/2023: Beta-2-microglobulin (mg/L): 3.6, Albumin (g/dL): 1.5, ISS: Stage II, LDH: Elevated - Unsigned    Prior Therapy: none  Current Therapy:  Daratumumab, lenalidomide and dexamethasone    HISTORY OF PRESENT ILLNESS:  Oncology History  Multiple myeloma (HCC)  04/14/2023 Initial Diagnosis   Multiple myeloma (HCC)   04/26/2023 -  Chemotherapy   Patient is on Treatment Plan : MYELOMA Newly Diagnosed Daratumumab SQ + Lenalidomide + Dexamethasone (DaraRd) q28d        INTERVAL HISTORY:   Kathy [name] is a 83 y.o. female presenting to clinic today for follow up of Multiple myeloma. She was last seen by me on 07/20/23.  Today, she  states that she is doing well overall. Her appetite level is at 40%. Her energy level is at 0%.  She denies having diarrhea. [name] does take a fair amount of fluid pills, which causes her to frequently urinate. Lower extremity edema is improved with compression pantyhose's and elevating her feet. Her lower back pain has improved since her last visit, and is currently a 6/10 severity.  She reports a decreased appetite, though she is able to force herself to eat. She has gained 5 pounds in 4 weeks, though this could be attributed to fluid retention.  Kathy [name] is unsure if she is receiving Revlimid pills at the nursing home she resides in. She is not receiving physical therapy there, though she states she feels as if she is getting stronger. She would like to one day move back to her house when she is strong enough to do so.   PAST MEDICAL HISTORY:   Past Medical History: Past Medical History:  Diagnosis Date   Abnormal findings on diagnostic imaging of heart and coronary circulation    Anemia    FROM BLEEDING ULCER   Anxiety    takes Alprazolam daily as needed   Arthritis    dx with RA 2017   Bariatric surgery status    Cataract    Diverticulosis    Gastro-esophageal reflux disease with esophagitis    Gastrojejunal ulcer with hemorrhage    Headache(784.0)    occasionally   History of blood transfusion    no abnormal reaction noted   History of bronchitis    last time many yrs ago   Hyperlipidemia    PT DENIES THIS DX -  ON NO MEDS AND NO ONE HAS TOLD HER   Hypertension    takes Amlodipine daily   Hypothyroidism    takes Synthroid daily   Insomnia    Joint pain    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   Localized edema    Nocturia    Numbness    occasionally left arm at night   Obesity    Osteoporosis    takes Fosamax weekly   Pain in joint involving pelvic region and thigh    Peripheral edema    takes Lasix daily as needed   PONV (postoperative nausea and  vomiting)    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/2017   Rheumatoid arthritis, unspecified (HCC)    Rotator cuff arthropathy, 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   Sleep apnea    Phreesia 08/22/2020   Stomach ulcer    Thyroid disease    Phreesia 08/22/2020   Unspecified injury of muscle(s) and tendon(s) of the rotator cuff of left shoulder, subsequent encounter    Unspecified osteoarthritis, unspecified site    Wears glasses    Wears partial dentures     Surgical History: Past Surgical History:  Procedure Laterality Date   ABDOMINAL HYSTERECTOMY     partial   cataract surgery Bilateral    CHOLECYSTECTOMY     COLONOSCOPY     ESOPHAGOGASTRODUODENOSCOPY (EGD) WITH PROPOFOL N/A 01/11/2022  Procedure: ESOPHAGOGASTRODUODENOSCOPY (EGD) WITH PROPOFOL;  Surgeon: Iva Boop, MD;  Location[facility]COPY;  Service: Gastroenterology;  Laterality: N/A;   EYE SURGERY     CATARACTS BOTH   gastric bypass surgery     HOT HEMOSTASIS N/A 01/11/2022   Procedure: HOT HEMOSTASIS (ARGON PLASMA COAGULATION/BICAP);  Surgeon: Iva Boop, MD;  Location: [facility] ENDOSCOPY;  Service: Gastroenterology;  Laterality: N/A;   IR IMAGING GUIDED PORT INSERTION  05/23/2023   JOINT REPLACEMENT Bilateral    hip   REVERSE SHOULDER ARTHROPLASTY Right 05/11/2016   Procedure: REVERSE SHOULDER ARTHROPLASTY;  Surgeon: Teryl Lucy, MD;  Location: [facility] OR;  Service: Orthopedics;  Laterality: Right;   REVISION TOTAL HIP ARTHROPLASTY Left 10/03/2013   DR Turner Daniels   ROTATOR CUFF REPAIR     SCLEROTHERAPY  01/11/2022   Procedure: Susa Day;  Surgeon: Iva Boop, MD;  Location: [facility] ENDOSCOPY;  Service: Gastroenterology;;   TOTAL HIP REVISION Left 10/03/2013   Procedure: TOTAL HIP REVISION- left;  Surgeon: Nestor Lewandowsky, MD;  Location: [facility] OR;  Service: Orthopedics;  Laterality: Left;   TOTAL KNEE ARTHROPLASTY Left 11/29/2017   Procedure: LEFT TOTAL KNEE ARTHROPLASTY;  Surgeon: Teryl Lucy, MD;  Location: [facility] OR;   Service: Orthopedics;  Laterality: Left;   TOTAL SHOULDER ARTHROPLASTY Left 11/09/2016   Procedure: TOTAL REVERSE SHOULDER ARTHROPLASTY;  Surgeon: Teryl Lucy, MD;  Location: [facility] OR;  Service: Orthopedics;  Laterality: Left;   TOTAL SHOULDER REPLACEMENT Left 10/2016   TOTAL THYROIDECTOMY     UPPER GASTROINTESTINAL ENDOSCOPY      Social History: Social History   Socioeconomic History   Marital status: Divorced    Spouse name: Not on file   Number of children: 0   Years of education: Not on file   Highest education level: Not on file  Occupational History   Occupation: disabled  Tobacco Use   Smoking status: Never    Passive exposure: Never   Smokeless tobacco: Never  Vaping Use   Vaping status: Never Used  Substance and Sexual Activity   Alcohol use: No   Drug use: No   Sexual activity: Not Currently    Birth control/protection: Surgical  Other Topics Concern   Not on file  Social History Narrative   Lives alone : "at [facility] for rehab right now."   Social Drivers of Health   Financial Resource Strain: Low Risk  (09/14/2021)   Overall Financial Resource Strain (CARDIA)    Difficulty of Paying Living Expenses: Not hard at all  Food Insecurity: Patient Unable To Answer (05/12/2023)   Hunger Vital Sign    Worried About Programme researcher, broadcasting/film/video in the Last Year: Patient unable to answer    Ran Out of Food in the Last Year: Patient unable to answer  Transportation Needs: Patient Unable To Answer (05/12/2023)   PRAPARE - Transportation    Lack of Transportation (Medical): Patient unable to answer    Lack of Transportation (Non-Medical): Patient unable to answer  Physical Activity: Sufficiently Active (09/14/2021)   Exercise Vital Sign    Days of Exercise per Week: 6 days    Minutes of Exercise per Session: 40 min  Stress: No Stress Concern Present (09/14/2021)   [employer] of Occupational Health - Occupational Stress Questionnaire    Feeling of Stress : Not at  all  Social Connections: Moderately Isolated (09/14/2021)   Social Connection and Isolation Panel [NHANES]    Frequency of Communication with Friends and Family: More than three times a  week    Frequency of Social Gatherings with Friends and Family: More than three times a week    Attends Religious Services: More than 4 times per year    Active Member of [employer] or Organizations: No    Attends Banker Meetings: Never    Marital Status: Divorced  Catering manager Violence: Patient Unable To Answer (05/12/2023)   Humiliation, Afraid, Rape, and Kick questionnaire    Fear of Current or Ex-Partner: Patient unable to answer    Emotionally Abused: Patient unable to answer    Physically Abused: Patient unable to answer    Sexually Abused: Patient unable to answer    Family History: Family History  Problem Relation Age of Onset   Diabetes Mother    Congestive Heart Failure Mother    Lung cancer Father    Thrombosis Sister    CAD Brother        CABG   Colon cancer Neg Hx    Esophageal cancer Neg Hx    Stomach cancer Neg Hx    Rectal cancer Neg Hx     Current Medications:  Current Outpatient Medications:    acetaminophen (TYLENOL) 500 MG tablet, Take 1 tablet (500 mg total) by mouth every 6 (six) hours as needed for mild pain, headache or fever (or Fever >/= 101)., Disp: 30 tablet, Rfl: 0   acyclovir (ZOVIRAX) 400 MG tablet, Take 1 tablet (400 mg total) by mouth 2 (two) times daily., Disp: 60 tablet, Rfl: 11   Adalimumab (HUMIRA PEN) 40 MG/0.4ML PNKT, Inject 40 mg into the skin every Saturday., Disp: , Rfl:    Aspirin 81 MG CAPS, Take 1 capsule by mouth daily., Disp: , Rfl:    calcium carbonate (TUMS - DOSED IN MG ELEMENTAL CALCIUM) 500 MG chewable tablet, Chew 1 tablet (200 mg of elemental calcium total) by mouth 3 (three) times daily with meals., Disp: , Rfl:    denosumab (PROLIA) 60 MG/ML SOSY injection, 60mg  Subcutaneous every 6 months 180 days, Disp: 1 mL, Rfl: 1    ferrous sulfate 325 (65 FE) MG EC tablet, Take 1 tablet (325 mg total) by mouth daily., Disp: 60 tablet, Rfl: 3   lenalidomide (REVLIMID) 10 MG capsule, Take 1 capsule (10 mg total) by mouth daily. Take 10 mg by mouth for 21 days. Take 21 days on, 7 days off every 28 days, Disp: 21 capsule, Rfl: 0   levothyroxine (SYNTHROID) 137 MCG tablet, Take 0.5 tablets (68.5 mcg total) by mouth daily at 6 (six) AM., Disp: , Rfl:    magnesium oxide (MAG-OX) 400 (240 Mg) MG tablet, Take 1 tablet (400 mg total) by mouth daily., Disp: , Rfl:    midodrine (PROAMATINE) 2.5 MG tablet, Take 3 tablets (7.5 mg total) by mouth 3 (three) times daily with meals., Disp: , Rfl:    Multiple Vitamin (MULTIVITAMIN WITH MINERALS) TABS tablet, Take 1 tablet by mouth every morning., Disp: , Rfl:    naloxone (NARCAN) nasal spray 4 mg/0.1 mL, Place 1 spray into the nose as needed (opioid reversal)., Disp: , Rfl:    Omega-3 Fatty Acids (FISH OIL) 1000 MG CAPS, Take 1 capsule by mouth daily., Disp: , Rfl:    omeprazole (PRILOSEC) 40 MG capsule, Take 1 capsule (40 mg total) by mouth daily before breakfast. Open capsule and swallow granules with liquid or applesauce, Disp: 90 capsule, Rfl: 3   ondansetron (ZOFRAN) 4 MG tablet, Take 4 mg by mouth every 6 (six) hours as needed for nausea  or vomiting., Disp: , Rfl:    oxybutynin (DITROPAN-XL) 10 MG 24 hr tablet, Take 10 mg by mouth at bedtime., Disp: , Rfl:    polyethylene glycol (MIRALAX / GLYCOLAX) 17 g packet, Take 17 g by mouth daily., Disp: 14 each, Rfl: 0   traMADol (ULTRAM) 50 MG tablet, Take 1 tablet (50 mg total) by mouth every 8 (eight) hours as needed for moderate pain (pain score 4-6)., Disp: 12 tablet, Rfl: 0   Allergies: Allergies  Allergen Reactions   Macrobid [Nitrofurantoin] Other (See Comments)    Stomach pain    REVIEW OF SYSTEMS:   Review of Systems  Constitutional:  Negative for chills, fatigue and fever.  HENT:   Positive for trouble swallowing. Negative for  lump/mass, mouth sores, nosebleeds and sore throat.   Eyes:  Negative for eye problems.  Respiratory:  Negative for cough and shortness of breath.   Cardiovascular:  Positive for chest pain. Negative for leg swelling and palpitations.  Gastrointestinal:  Positive for nausea. Negative for abdominal pain, constipation, diarrhea and vomiting.  Genitourinary:  Negative for bladder incontinence, difficulty urinating, dysuria, frequency, hematuria and nocturia.   Musculoskeletal:  Positive for back pain (lower back, 6/10 severity). Negative for arthralgias, flank pain, myalgias and neck pain.  Skin:  Negative for itching and rash.  Neurological:  Positive for numbness (in hands). Negative for dizziness and headaches.  Hematological:  Does not bruise/bleed easily.  Psychiatric/Behavioral:  Negative for depression, sleep disturbance and suicidal ideas. The patient is not nervous/anxious.   All other systems reviewed and are negative.    VITALS:   There were no vitals taken for this visit.  Wt Readings from Last 3 Encounters:  09/22/23 155 lb 14.4 oz (70.7 kg)  08/25/23 150 lb 6.4 oz (68.2 kg)  08/11/23 147 lb 9.6 oz (67 kg)    There is no height or weight on file to calculate BMI.  Performance status (ECOG): 1 - Symptomatic but completely ambulatory  PHYSICAL EXAM:   Physical Exam Vitals and nursing note reviewed. Exam conducted with a chaperone present.  Constitutional:      Appearance: Normal appearance.  Cardiovascular:     Rate and Rhythm: Normal rate and regular rhythm.     Pulses: Normal pulses.     Heart sounds: Normal heart sounds.  Pulmonary:     Effort: Pulmonary effort is normal.     Breath sounds: Normal breath sounds.  Abdominal:     Palpations: Abdomen is soft. There is no hepatomegaly, splenomegaly or mass.     Tenderness: There is no abdominal tenderness.  Musculoskeletal:     Right lower leg: 2+ Edema present.     Left lower leg: 2+ Edema present.   Lymphadenopathy:     Cervical: No cervical adenopathy.     Right cervical: No superficial, deep or posterior cervical adenopathy.    Left cervical: No superficial, deep or posterior cervical adenopathy.     Upper Body:     Right upper body: No supraclavicular or axillary adenopathy.     Left upper body: No supraclavicular or axillary adenopathy.  Neurological:     General: No focal deficit present.     Mental Status: She is alert and oriented to person, place, and time.  Psychiatric:        Mood and Affect: Mood normal.        Behavior: Behavior normal.     LABS:      Latest Ref Rng & Units 09/22/2023  9:13 AM 08/25/2023    8:49 AM 08/11/2023    8:57 AM  CBC  WBC 4.0 - 10.5 K/uL 3.3  5.5  4.0   Hemoglobin 12.0 - 15.0 g/dL 9.4  9.1  9.1   Hematocrit 36.0 - 46.0 % 27.1  27.2  26.8   Platelets 150 - 400 K/uL 193  217  182       Latest Ref Rng & Units 09/22/2023    9:13 AM 08/25/2023    8:49 AM 08/11/2023    8:57 AM  CMP  Glucose 70 - 99 mg/dL 97  98  91   BUN 8 - 23 mg/dL 11  13  11    Creatinine 0.44 - 1.00 mg/dL 2.84  1.32  4.40   Sodium 135 - 145 mmol/L 136  139  136   Potassium 3.5 - 5.1 mmol/L 2.4  2.7  2.7   Chloride 98 - 111 mmol/L 105  108  104   CO2 22 - 32 mmol/L 24  23  23    Calcium 8.9 - 10.3 mg/dL 5.6  7.0  6.7   Total Protein 6.5 - 8.1 g/dL 5.0  4.7  5.0   Total Bilirubin 0.0 - 1.2 mg/dL 1.2  0.7  0.8   Alkaline Phos 38 - 126 U/L 181  191  216   AST 15 - 41 U/L 21  32  34   ALT 0 - 44 U/L 20  25  19       No results found for: "CEA1", "CEA" / No results found for: "CEA1", "CEA" No results found for: "PSA1" No results found for: "NUU725" No results found for: "CAN125"  Lab Results  Component Value Date   TOTALPROTELP 5.6 (L) 07/06/2023   ALBUMINELP 2.2 (L) 07/06/2023   A1GS 0.4 07/06/2023   A2GS 0.7 07/06/2023   BETS 1.9 (H) 07/06/2023   GAMS 0.4 07/06/2023   MSPIKE 1.4 (H) 07/06/2023   SPEI Comment 07/06/2023   Lab Results  Component Value Date    TIBC NOT CALCULATED 06/16/2023   TIBC 111 (L) 05/15/2023   TIBC 184 (L) 01/26/2023   FERRITIN 811 (H) 06/16/2023   FERRITIN 559 (H) 05/15/2023   FERRITIN 101 01/26/2023   IRONPCTSAT NOT CALCULATED 06/16/2023   IRONPCTSAT 63 (H) 05/15/2023   IRONPCTSAT 36 (H) 01/26/2023   Lab Results  Component Value Date   LDH 238 (H) 05/14/2023   LDH 200 (H) 04/08/2023     STUDIES:   No results found.

## 2023-09-22 ENCOUNTER — Inpatient Hospital Stay: Payer: 59 | Attending: Hematology | Admitting: Hematology

## 2023-09-22 ENCOUNTER — Inpatient Hospital Stay: Payer: 59

## 2023-09-22 DIAGNOSIS — C9000 Multiple myeloma not having achieved remission: Secondary | ICD-10-CM | POA: Insufficient documentation

## 2023-09-22 DIAGNOSIS — Z5112 Encounter for antineoplastic immunotherapy: Secondary | ICD-10-CM | POA: Diagnosis not present

## 2023-09-22 DIAGNOSIS — E8809 Other disorders of plasma-protein metabolism, not elsewhere classified: Secondary | ICD-10-CM | POA: Diagnosis not present

## 2023-09-22 DIAGNOSIS — Z79899 Other long term (current) drug therapy: Secondary | ICD-10-CM | POA: Insufficient documentation

## 2023-09-22 DIAGNOSIS — E876 Hypokalemia: Secondary | ICD-10-CM | POA: Insufficient documentation

## 2023-09-22 LAB — CBC WITH DIFFERENTIAL/PLATELET
Abs Immature Granulocytes: 0.01 10*3/uL (ref 0.00–0.07)
Basophils Absolute: 0 10*3/uL (ref 0.0–0.1)
Basophils Relative: 0 %
Eosinophils Absolute: 0 10*3/uL (ref 0.0–0.5)
Eosinophils Relative: 1 %
HCT: 27.1 % — ABNORMAL LOW (ref 36.0–46.0)
Hemoglobin: 9.4 g/dL — ABNORMAL LOW (ref 12.0–15.0)
Immature Granulocytes: 0 %
Lymphocytes Relative: 55 %
Lymphs Abs: 1.8 10*3/uL (ref 0.7–4.0)
MCH: 33 pg (ref 26.0–34.0)
MCHC: 34.7 g/dL (ref 30.0–36.0)
MCV: 95.1 fL (ref 80.0–100.0)
Monocytes Absolute: 0.6 10*3/uL (ref 0.1–1.0)
Monocytes Relative: 18 %
Neutro Abs: 0.8 10*3/uL — ABNORMAL LOW (ref 1.7–7.7)
Neutrophils Relative %: 26 %
Platelets: 193 10*3/uL (ref 150–400)
RBC: 2.85 MIL/uL — ABNORMAL LOW (ref 3.87–5.11)
RDW: 16.5 % — ABNORMAL HIGH (ref 11.5–15.5)
Smear Review: ADEQUATE
WBC: 3.3 10*3/uL — ABNORMAL LOW (ref 4.0–10.5)
nRBC: 0 % (ref 0.0–0.2)

## 2023-09-22 LAB — COMPREHENSIVE METABOLIC PANEL
ALT: 20 U/L (ref 0–44)
AST: 21 U/L (ref 15–41)
Albumin: 1.8 g/dL — ABNORMAL LOW (ref 3.5–5.0)
Alkaline Phosphatase: 181 U/L — ABNORMAL HIGH (ref 38–126)
Anion gap: 7 (ref 5–15)
BUN: 11 mg/dL (ref 8–23)
CO2: 24 mmol/L (ref 22–32)
Calcium: 5.6 mg/dL — CL (ref 8.9–10.3)
Chloride: 105 mmol/L (ref 98–111)
Creatinine, Ser: 0.94 mg/dL (ref 0.44–1.00)
GFR, Estimated: >60 mL/min (ref >60)
Glucose, Bld: 97 mg/dL (ref 70–99)
Potassium: 2.4 mmol/L — CL (ref 3.5–5.1)
Sodium: 136 mmol/L (ref 135–145)
Total Bilirubin: 1.2 mg/dL (ref 0.0–1.2)
Total Protein: 5 g/dL — ABNORMAL LOW (ref 6.5–8.1)

## 2023-09-22 MED ORDER — HEPARIN SOD (PORK) LOCK FLUSH 100 UNIT/ML IV SOLN
500.0000 [IU] | Freq: Once | INTRAVENOUS | Status: AC
  Administered 2023-09-22: 500 [IU] via INTRAVENOUS

## 2023-09-22 MED ORDER — POTASSIUM CHLORIDE CRYS ER 20 MEQ PO TBCR
40.0000 meq | EXTENDED_RELEASE_TABLET | ORAL | Status: AC
  Administered 2023-09-22: 40 meq via ORAL
  Filled 2023-09-22: qty 2

## 2023-09-22 MED ORDER — CALCIUM GLUCONATE-NACL 1-0.675 GM/50ML-% IV SOLN
1.0000 g | INTRAVENOUS | Status: AC
  Administered 2023-09-22 (×2): 1000 mg via INTRAVENOUS
  Filled 2023-09-22: qty 50

## 2023-09-22 MED ORDER — SODIUM CHLORIDE 0.9% FLUSH
10.0000 mL | Freq: Once | INTRAVENOUS | Status: AC
  Administered 2023-09-22: 10 mL

## 2023-09-22 MED ORDER — DARATUMUMAB-HYALURONIDASE-FIHJ 1800-30000 MG-UT/15ML ~~LOC~~ SOLN
1800.0000 mg | Freq: Once | SUBCUTANEOUS | Status: AC
  Administered 2023-09-22: 1800 mg via SUBCUTANEOUS
  Filled 2023-09-22: qty 15

## 2023-09-22 MED ORDER — POTASSIUM CHLORIDE IN NACL 20-0.9 MEQ/L-% IV SOLN
Freq: Once | INTRAVENOUS | Status: AC
  Filled 2023-09-22: qty 1000

## 2023-09-22 MED ORDER — SODIUM CHLORIDE 0.9 % IV SOLN: 1.0000 g | INTRAVENOUS | Status: DC

## 2023-09-22 MED ORDER — ACETAMINOPHEN 325 MG PO TABS
650.0000 mg | ORAL_TABLET | Freq: Once | ORAL | Status: AC
  Administered 2023-09-22: 650 mg via ORAL
  Filled 2023-09-22: qty 2

## 2023-09-22 MED ORDER — SODIUM CHLORIDE 0.9 % IV SOLN: 2.0000 g | Freq: Once | INTRAVENOUS | Status: DC

## 2023-09-22 MED ORDER — POTASSIUM CHLORIDE CRYS ER 20 MEQ PO TBCR
40.0000 meq | EXTENDED_RELEASE_TABLET | Freq: Once | ORAL | Status: AC
  Administered 2023-09-22: 40 meq via ORAL
  Filled 2023-09-22: qty 2

## 2023-09-22 MED ORDER — DEXAMETHASONE 4 MG PO TABS
20.0000 mg | ORAL_TABLET | Freq: Once | ORAL | Status: AC
  Administered 2023-09-22: 20 mg via ORAL
  Filled 2023-09-22: qty 5

## 2023-09-22 NOTE — Progress Notes (Signed)
 Patient presents today for Darzalex Faspro and follow up appointment with Dr. Ellin Saba. Vital signs are within parameters for treatment. Labs pending. Patient has no complaints of any changes since her last treatment.  Potassium 2.4 and calcium 5.6. Message received from A.Anderson RN / Dr. Ellin Saba to give 2 grams calcium gluconate and standing orders for potassium. Patient will receive KCL 20 mEq IV and K-Dur 40 mEq PO pre and post IV KCL.   Treatment given today per MD orders. Tolerated  without adverse affects. Vital signs stable. No complaints at this time. Discharged from clinic by wheel chair in stable condition. Alert and oriented x 3. F/U with [facility] as scheduled.

## 2023-09-22 NOTE — Patient Instructions (Signed)
 [facility] [place] [facility] - A DEPT OF [facility]  Discharge Instructions: Thank you for choosing [facility] to provide your oncology and hematology care.  If you have a lab appointment with the Cancer Center - please note that after April 8th, 2024, all labs will be drawn in the cancer center.  You do not have to check in or register with the main entrance as you have in the past but will complete your check-in in the cancer center.  Wear comfortable clothing and clothing appropriate for easy access to any Portacath or PICC line.   We strive to give you quality time with your provider. You may need to reschedule your appointment if you arrive late (15 or more minutes).  Arriving late affects you and other patients whose appointments are after yours.  Also, if you miss three or more appointments without notifying the office, you may be dismissed from the clinic at the provider's discretion.      For prescription refill requests, have your pharmacy contact our office and allow 72 hours for refills to be completed.    Today you received the following chemotherapy and/or immunotherapy agents Darzalex Faspro.       To help prevent nausea and vomiting after your treatment, we encourage you to take your nausea medication as directed.  BELOW ARE SYMPTOMS THAT SHOULD BE REPORTED IMMEDIATELY: *FEVER GREATER THAN 100.4 F (38 C) OR HIGHER *CHILLS OR SWEATING *NAUSEA AND VOMITING THAT IS NOT CONTROLLED WITH YOUR NAUSEA MEDICATION *UNUSUAL SHORTNESS OF BREATH *UNUSUAL BRUISING OR BLEEDING *URINARY PROBLEMS (pain or burning when urinating, or frequent urination) *BOWEL PROBLEMS (unusual diarrhea, constipation, pain near the anus) TENDERNESS IN MOUTH AND THROAT WITH OR WITHOUT PRESENCE OF ULCERS (sore throat, sores in mouth, or a toothache) UNUSUAL RASH, SWELLING OR PAIN  UNUSUAL VAGINAL DISCHARGE OR ITCHING   Items with * indicate a potential emergency and should be  followed up as soon as possible or go to the Emergency Department if any problems should occur.  Please show the CHEMOTHERAPY ALERT CARD or IMMUNOTHERAPY ALERT CARD at check-in to the Emergency Department and triage nurse.  Should you have questions after your visit or need to cancel or reschedule your appointment, please contact [facility] [place] [facility] - A DEPT OF [facility] [phone]  and follow the prompts.  Office hours are 8:00 a.m. to 4:30 p.m. Monday - Friday. Please note that voicemails left after 4:00 p.m. may not be returned until the following business day.  We are closed weekends and major holidays. You have access to a nurse at all times for urgent questions. Please call the main number to the clinic [phone] and follow the prompts.  For any non-urgent questions, you may also contact your provider using MyChart. We now offer e-Visits for anyone 20 and older to request care online for non-urgent symptoms. For details visit [email or web address].   Also download the MyChart app! Go to the app store, search "MyChart", open the app, select [place], and log in with your MyChart username and password.

## 2023-09-22 NOTE — Progress Notes (Signed)
 CRITICAL VALUE ALERT Critical value received:  Calcium 5.6, K+2.4 Date of notification:  09-22-23 Time of notification: 0954 Critical value read back:  Yes.   Nurse who received alert:  C. Steel Kerney RN MD notified time and response:  Dr. Ellin Saba, will give K+per standing orders and  calcium gluconate per orders. 1610

## 2023-09-22 NOTE — Patient Instructions (Signed)
 [facility] at [facility] Discharge Instructions   You were seen and examined today by Dr. Ellin Saba.  He reviewed the results of your lab work which are mostly normal/stable. Your potassium is very low at 2.4 and your calcium is also low. We will give you IV calcium and potassium today.  We will proceed with your treatment today.   Return as scheduled.    Thank you for choosing [facility] at [facility] to provide your oncology and hematology care.  To afford each patient quality time with our provider, please arrive at least 15 minutes before your scheduled appointment time.   If you have a lab appointment with the Cancer Center please come in thru the Main Entrance and check in at the main information desk.  You need to re-schedule your appointment should you arrive 10 or more minutes late.  We strive to give you quality time with our providers, and arriving late affects you and other patients whose appointments are after yours.  Also, if you no show three or more times for appointments you may be dismissed from the clinic at the providers discretion.     Again, thank you for choosing [facility].  Our hope is that these requests will decrease the amount of time that you wait before being seen by our physicians.       _____________________________________________________________  Should you have questions after your visit to [facility], please contact our office at [phone] and follow the prompts.  Our office hours are 8:00 a.m. and 4:30 p.m. Monday - Friday.  Please note that voicemails left after 4:00 p.m. may not be returned until the following business day.  We are closed weekends and major holidays.  You do have access to a nurse 24-7, just call the main number to the clinic [phone] and do not press any options, hold on the line and a nurse will answer the phone.    For prescription refill requests,  have your pharmacy contact our office and allow 72 hours.    Due to Covid, you will need to wear a mask upon entering the hospital. If you do not have a mask, a mask will be given to you at the Main Entrance upon arrival. For doctor visits, patients may have 1 support person age 51 or older with them. For treatment visits, patients can not have anyone with them due to social distancing guidelines and our immunocompromised population.

## 2023-09-22 NOTE — Progress Notes (Signed)
 Patient has been examined by Dr. Ellin Saba. Vital signs and labs have been reviewed by MD - ANC, Creatinine, LFTs, hemoglobin, and platelets are within treatment parameters per M.D. - pt may proceed with treatment. Give calcium gluconate 2g IV and K+ per standing orders. Primary RN and pharmacy notified.

## 2023-09-23 LAB — KAPPA/LAMBDA LIGHT CHAINS
Kappa free light chain: 58.7 mg/L — ABNORMAL HIGH (ref 3.3–19.4)
Kappa, lambda light chain ratio: 3.16 — ABNORMAL HIGH (ref 0.26–1.65)
Lambda free light chains: 18.6 mg/L (ref 5.7–26.3)

## 2023-09-23 NOTE — Progress Notes (Signed)
 Completed Claim Form for [employer] as requested. Fax transmission confirmation received. Copy of form emailed as requested. No other needs or concerns noted.

## 2023-09-26 LAB — PROTEIN ELECTROPHORESIS, SERUM
A/G Ratio: 0.7 (ref 0.7–1.7)
Albumin ELP: 1.8 g/dL — ABNORMAL LOW (ref 2.9–4.4)
Alpha-1-Globulin: 0.2 g/dL (ref 0.0–0.4)
Alpha-2-Globulin: 0.5 g/dL (ref 0.4–1.0)
Beta Globulin: 1.2 g/dL (ref 0.7–1.3)
Gamma Globulin: 0.5 g/dL (ref 0.4–1.8)
Globulin, Total: 2.5 g/dL (ref 2.2–3.9)
M-Spike, %: 0.8 g/dL — ABNORMAL HIGH
Total Protein ELP: 4.3 g/dL — ABNORMAL LOW (ref 6.0–8.5)

## 2023-09-28 DIAGNOSIS — I959 Hypotension, unspecified: Secondary | ICD-10-CM | POA: Diagnosis not present

## 2023-09-28 DIAGNOSIS — M069 Rheumatoid arthritis, unspecified: Secondary | ICD-10-CM | POA: Diagnosis not present

## 2023-09-28 DIAGNOSIS — E43 Unspecified severe protein-calorie malnutrition: Secondary | ICD-10-CM | POA: Diagnosis not present

## 2023-09-28 DIAGNOSIS — C9000 Multiple myeloma not having achieved remission: Secondary | ICD-10-CM | POA: Diagnosis not present

## 2023-09-28 DIAGNOSIS — R600 Localized edema: Secondary | ICD-10-CM | POA: Diagnosis not present

## 2023-09-28 DIAGNOSIS — Z5111 Encounter for antineoplastic chemotherapy: Secondary | ICD-10-CM | POA: Diagnosis not present

## 2023-09-28 DIAGNOSIS — E039 Hypothyroidism, unspecified: Secondary | ICD-10-CM | POA: Diagnosis not present

## 2023-09-28 DIAGNOSIS — D630 Anemia in neoplastic disease: Secondary | ICD-10-CM | POA: Diagnosis not present

## 2023-09-30 ENCOUNTER — Emergency Department: Payer: 59

## 2023-09-30 ENCOUNTER — Inpatient Hospital Stay
Admission: EM | Admit: 2023-09-30 | Discharge: 2023-10-03 | DRG: 315 | Disposition: A | Discharge status: Skilled Nursing Facility | Payer: 59 | Source: Skilled Nursing Facility | Attending: Internal Medicine | Admitting: Internal Medicine

## 2023-09-30 ENCOUNTER — Other Ambulatory Visit: Payer: Self-pay

## 2023-09-30 DIAGNOSIS — I9589 Other hypotension: Secondary | ICD-10-CM | POA: Diagnosis not present

## 2023-09-30 DIAGNOSIS — M069 Rheumatoid arthritis, unspecified: Secondary | ICD-10-CM | POA: Diagnosis present

## 2023-09-30 DIAGNOSIS — R8290 Unspecified abnormal findings in urine: Secondary | ICD-10-CM | POA: Diagnosis not present

## 2023-09-30 DIAGNOSIS — Z79890 Hormone replacement therapy (postmenopausal): Secondary | ICD-10-CM | POA: Diagnosis not present

## 2023-09-30 DIAGNOSIS — Z96611 Presence of right artificial shoulder joint: Secondary | ICD-10-CM | POA: Diagnosis not present

## 2023-09-30 DIAGNOSIS — Z7983 Long term (current) use of bisphosphonates: Secondary | ICD-10-CM | POA: Diagnosis not present

## 2023-09-30 DIAGNOSIS — E875 Hyperkalemia: Secondary | ICD-10-CM | POA: Diagnosis not present

## 2023-09-30 DIAGNOSIS — C9000 Multiple myeloma not having achieved remission: Secondary | ICD-10-CM | POA: Diagnosis not present

## 2023-09-30 DIAGNOSIS — R6889 Other general symptoms and signs: Secondary | ICD-10-CM | POA: Diagnosis not present

## 2023-09-30 DIAGNOSIS — F419 Anxiety disorder, unspecified: Secondary | ICD-10-CM | POA: Diagnosis present

## 2023-09-30 DIAGNOSIS — E876 Hypokalemia: Secondary | ICD-10-CM | POA: Diagnosis not present

## 2023-09-30 DIAGNOSIS — Z96612 Presence of left artificial shoulder joint: Secondary | ICD-10-CM | POA: Diagnosis present

## 2023-09-30 DIAGNOSIS — R600 Localized edema: Secondary | ICD-10-CM | POA: Diagnosis not present

## 2023-09-30 DIAGNOSIS — Z6829 Body mass index (BMI) 29.0-29.9, adult: Secondary | ICD-10-CM

## 2023-09-30 DIAGNOSIS — Z7401 Bed confinement status: Secondary | ICD-10-CM | POA: Diagnosis not present

## 2023-09-30 DIAGNOSIS — Z9884 Bariatric surgery status: Secondary | ICD-10-CM

## 2023-09-30 DIAGNOSIS — E46 Unspecified protein-calorie malnutrition: Secondary | ICD-10-CM | POA: Diagnosis not present

## 2023-09-30 DIAGNOSIS — E669 Obesity, unspecified: Secondary | ICD-10-CM | POA: Diagnosis present

## 2023-09-30 DIAGNOSIS — E8809 Other disorders of plasma-protein metabolism, not elsewhere classified: Secondary | ICD-10-CM | POA: Diagnosis not present

## 2023-09-30 DIAGNOSIS — D630 Anemia in neoplastic disease: Secondary | ICD-10-CM | POA: Diagnosis present

## 2023-09-30 DIAGNOSIS — Z66 Do not resuscitate: Secondary | ICD-10-CM | POA: Diagnosis present

## 2023-09-30 DIAGNOSIS — I10 Essential (primary) hypertension: Secondary | ICD-10-CM | POA: Diagnosis not present

## 2023-09-30 DIAGNOSIS — Z96652 Presence of left artificial knee joint: Secondary | ICD-10-CM | POA: Diagnosis present

## 2023-09-30 DIAGNOSIS — M810 Age-related osteoporosis without current pathological fracture: Secondary | ICD-10-CM | POA: Diagnosis present

## 2023-09-30 DIAGNOSIS — E785 Hyperlipidemia, unspecified: Secondary | ICD-10-CM | POA: Diagnosis not present

## 2023-09-30 DIAGNOSIS — Z7982 Long term (current) use of aspirin: Secondary | ICD-10-CM

## 2023-09-30 DIAGNOSIS — Z79899 Other long term (current) drug therapy: Secondary | ICD-10-CM

## 2023-09-30 DIAGNOSIS — H269 Unspecified cataract: Secondary | ICD-10-CM | POA: Diagnosis not present

## 2023-09-30 DIAGNOSIS — Z833 Family history of diabetes mellitus: Secondary | ICD-10-CM

## 2023-09-30 DIAGNOSIS — G4700 Insomnia, unspecified: Secondary | ICD-10-CM | POA: Diagnosis present

## 2023-09-30 DIAGNOSIS — Z1152 Encounter for screening for COVID-19: Secondary | ICD-10-CM

## 2023-09-30 DIAGNOSIS — R0689 Other abnormalities of breathing: Secondary | ICD-10-CM | POA: Diagnosis not present

## 2023-09-30 DIAGNOSIS — I959 Hypotension, unspecified: Secondary | ICD-10-CM | POA: Diagnosis not present

## 2023-09-30 DIAGNOSIS — G4733 Obstructive sleep apnea (adult) (pediatric): Secondary | ICD-10-CM | POA: Diagnosis present

## 2023-09-30 DIAGNOSIS — Z96642 Presence of left artificial hip joint: Secondary | ICD-10-CM | POA: Diagnosis present

## 2023-09-30 DIAGNOSIS — Z8249 Family history of ischemic heart disease and other diseases of the circulatory system: Secondary | ICD-10-CM | POA: Diagnosis not present

## 2023-09-30 DIAGNOSIS — Z801 Family history of malignant neoplasm of trachea, bronchus and lung: Secondary | ICD-10-CM

## 2023-09-30 DIAGNOSIS — R29898 Other symptoms and signs involving the musculoskeletal system: Secondary | ICD-10-CM | POA: Diagnosis not present

## 2023-09-30 DIAGNOSIS — E8770 Fluid overload, unspecified: Secondary | ICD-10-CM | POA: Diagnosis present

## 2023-09-30 DIAGNOSIS — Z888 Allergy status to other drugs, medicaments and biological substances status: Secondary | ICD-10-CM

## 2023-09-30 DIAGNOSIS — K219 Gastro-esophageal reflux disease without esophagitis: Secondary | ICD-10-CM | POA: Diagnosis not present

## 2023-09-30 DIAGNOSIS — R531 Weakness: Secondary | ICD-10-CM | POA: Diagnosis not present

## 2023-09-30 DIAGNOSIS — Z743 Need for continuous supervision: Secondary | ICD-10-CM | POA: Diagnosis not present

## 2023-09-30 LAB — COMPREHENSIVE METABOLIC PANEL
ALT: 19 U/L (ref 0–44)
AST: 23 U/L (ref 15–41)
Albumin: <1.5 g/dL — ABNORMAL LOW (ref 3.5–5.0)
Alkaline Phosphatase: 185 U/L — ABNORMAL HIGH (ref 38–126)
Anion gap: 8 (ref 5–15)
BUN: 18 mg/dL (ref 8–23)
CO2: 22 mmol/L (ref 22–32)
Calcium: 6 mg/dL — CL (ref 8.9–10.3)
Chloride: 108 mmol/L (ref 98–111)
Creatinine, Ser: 1.09 mg/dL — ABNORMAL HIGH (ref 0.44–1.00)
GFR, Estimated: 51 mL/min — ABNORMAL LOW (ref >60)
Glucose, Bld: 120 mg/dL — ABNORMAL HIGH (ref 70–99)
Potassium: 5.4 mmol/L — ABNORMAL HIGH (ref 3.5–5.1)
Sodium: 138 mmol/L (ref 135–145)
Total Bilirubin: 1.2 mg/dL (ref 0.0–1.2)
Total Protein: 4.4 g/dL — ABNORMAL LOW (ref 6.5–8.1)

## 2023-09-30 LAB — URINALYSIS, W/ REFLEX TO CULTURE (INFECTION SUSPECTED)
Bilirubin Urine: NEGATIVE
Glucose, UA: NEGATIVE mg/dL
Ketones, ur: NEGATIVE mg/dL
Nitrite: NEGATIVE
Protein, ur: NEGATIVE mg/dL
Specific Gravity, Urine: 1.006 (ref 1.005–1.030)
pH: 6 (ref 5.0–8.0)

## 2023-09-30 LAB — LACTIC ACID, PLASMA
Lactic Acid, Venous: 1.1 mmol/L (ref 0.5–1.9)
Lactic Acid, Venous: 0.9 mmol/L (ref 0.5–1.9)

## 2023-09-30 LAB — CBC WITH DIFFERENTIAL/PLATELET
Abs Immature Granulocytes: 0.04 10*3/uL (ref 0.00–0.07)
Basophils Absolute: 0 10*3/uL (ref 0.0–0.1)
Basophils Relative: 0 %
Eosinophils Absolute: 0 10*3/uL (ref 0.0–0.5)
Eosinophils Relative: 0 %
HCT: 23.1 % — ABNORMAL LOW (ref 36.0–46.0)
Hemoglobin: 8.3 g/dL — ABNORMAL LOW (ref 12.0–15.0)
Immature Granulocytes: 1 %
Lymphocytes Relative: 30 %
Lymphs Abs: 1.4 10*3/uL (ref 0.7–4.0)
MCH: 33.7 pg (ref 26.0–34.0)
MCHC: 35.9 g/dL (ref 30.0–36.0)
MCV: 93.9 fL (ref 80.0–100.0)
Monocytes Absolute: 0.3 10*3/uL (ref 0.1–1.0)
Monocytes Relative: 6 %
Neutro Abs: 3 10*3/uL (ref 1.7–7.7)
Neutrophils Relative %: 63 %
Platelets: 151 10*3/uL (ref 150–400)
RBC: 2.46 MIL/uL — ABNORMAL LOW (ref 3.87–5.11)
RDW: 17.8 % — ABNORMAL HIGH (ref 11.5–15.5)
WBC: 4.7 10*3/uL (ref 4.0–10.5)
nRBC: 0 % (ref 0.0–0.2)

## 2023-09-30 LAB — RESP PANEL BY RT-PCR (RSV, FLU A&B, COVID)  RVPGX2
Influenza A by PCR: NEGATIVE
Influenza B by PCR: NEGATIVE
Resp Syncytial Virus by PCR: NEGATIVE
SARS Coronavirus 2 by RT PCR: NEGATIVE

## 2023-09-30 LAB — MRSA NEXT GEN BY PCR, NASAL: MRSA by PCR Next Gen: NOT DETECTED

## 2023-09-30 LAB — PROCALCITONIN: Procalcitonin: 0.41 ng/mL

## 2023-09-30 LAB — PHOSPHORUS: Phosphorus: 2.6 mg/dL (ref 2.5–4.6)

## 2023-09-30 LAB — MAGNESIUM: Magnesium: 1.7 mg/dL (ref 1.7–2.4)

## 2023-09-30 MED ORDER — METOPROLOL TARTRATE 5 MG/5ML IV SOLN
10.0000 mg | INTRAVENOUS | Status: AC
Start: 2023-10-01 — End: 2023-09-30
  Administered 2023-09-30: 10 mg via INTRAVENOUS

## 2023-09-30 MED ORDER — LEVOTHYROXINE SODIUM 137 MCG PO TABS
68.5000 ug | ORAL_TABLET | Freq: Every day | ORAL | Status: DC
  Administered 2023-10-01 – 2023-10-03 (×3): 68.5 ug via ORAL
  Filled 2023-09-30 (×3): qty 0.5

## 2023-09-30 MED ORDER — ONDANSETRON HCL 4 MG PO TABS: 4.0000 mg | ORAL_TABLET | Freq: Four times a day (QID) | ORAL | Status: DC | PRN

## 2023-09-30 MED ORDER — SODIUM CHLORIDE 0.9 % IV SOLN: 1.0000 g | INTRAVENOUS | Status: DC

## 2023-09-30 MED ORDER — CALCIUM GLUCONATE 10 % IV SOLN
INTRAVENOUS | Status: AC
  Administered 2023-09-30: 1 g via INTRAVENOUS
  Filled 2023-09-30: qty 10

## 2023-09-30 MED ORDER — MIDODRINE HCL 5 MG PO TABS
10.0000 mg | ORAL_TABLET | Freq: Three times a day (TID) | ORAL | Status: DC
  Administered 2023-09-30: 10 mg via ORAL
  Filled 2023-09-30: qty 2

## 2023-09-30 MED ORDER — OXYBUTYNIN CHLORIDE ER 10 MG PO TB24
10.0000 mg | ORAL_TABLET | Freq: Every day | ORAL | Status: DC
  Administered 2023-09-30 – 2023-10-02 (×3): 10 mg via ORAL
  Filled 2023-09-30 (×3): qty 1

## 2023-09-30 MED ORDER — METOPROLOL TARTRATE 5 MG/5ML IV SOLN
INTRAVENOUS | Status: AC
  Administered 2023-09-30: 5 mg via INTRAVENOUS
  Filled 2023-09-30: qty 5

## 2023-09-30 MED ORDER — SODIUM CHLORIDE 0.9 % IV SOLN
250.0000 mL | INTRAVENOUS | Status: DC
Start: 2023-09-30 — End: 2023-10-01
  Administered 2023-09-30: 250 mL via INTRAVENOUS

## 2023-09-30 MED ORDER — SODIUM CHLORIDE 0.9 % IV BOLUS
1000.0000 mL | Freq: Once | INTRAVENOUS | Status: AC
  Administered 2023-09-30: 1000 mL via INTRAVENOUS

## 2023-09-30 MED ORDER — SODIUM CHLORIDE 0.9 % IV BOLUS
1000.0000 mL | Freq: Once | INTRAVENOUS | Status: AC
  Administered 2023-09-30: 250 mL via INTRAVENOUS

## 2023-09-30 MED ORDER — SODIUM CHLORIDE 0.9 % IV SOLN
1.0000 g | Freq: Once | INTRAVENOUS | Status: AC
  Administered 2023-09-30: 1 g via INTRAVENOUS
  Filled 2023-09-30: qty 10

## 2023-09-30 MED ORDER — CALCIUM GLUCONATE 10 % IV SOLN
1.0000 g | Freq: Once | INTRAVENOUS | Status: AC
  Filled 2023-09-30: qty 10

## 2023-09-30 MED ORDER — CHLORHEXIDINE GLUCONATE CLOTH 2 % EX PADS
6.0000 | MEDICATED_PAD | Freq: Every day | CUTANEOUS | Status: DC
  Administered 2023-09-30 – 2023-10-03 (×4): 6 via TOPICAL
  Filled 2023-09-30: qty 6

## 2023-09-30 MED ORDER — SODIUM CHLORIDE 0.9 % IV SOLN
1.0000 g | INTRAVENOUS | Status: DC
  Administered 2023-09-30 – 2023-10-02 (×3): 1 g via INTRAVENOUS
  Filled 2023-09-30 (×3): qty 10

## 2023-09-30 MED ORDER — METOPROLOL TARTRATE 5 MG/5ML IV SOLN
5.0000 mg | Freq: Once | INTRAVENOUS | Status: AC
  Filled 2023-09-30: qty 5

## 2023-09-30 MED ORDER — ENOXAPARIN SODIUM 40 MG/0.4ML IJ SOSY
40.0000 mg | PREFILLED_SYRINGE | INTRAMUSCULAR | Status: DC
  Administered 2023-09-30 – 2023-10-02 (×3): 40 mg via SUBCUTANEOUS
  Filled 2023-09-30 (×3): qty 0.4

## 2023-09-30 MED ORDER — SODIUM CHLORIDE 0.9% FLUSH: 10.0000 mL | INTRAVENOUS | Status: DC | PRN

## 2023-09-30 MED ORDER — MIDODRINE HCL 5 MG PO TABS: 10.0000 mg | ORAL_TABLET | Freq: Three times a day (TID) | ORAL | Status: DC

## 2023-09-30 MED ORDER — ACETAMINOPHEN 650 MG RE SUPP: 650.0000 mg | Freq: Four times a day (QID) | RECTAL | Status: DC | PRN

## 2023-09-30 MED ORDER — PANTOPRAZOLE SODIUM 40 MG PO TBEC
40.0000 mg | DELAYED_RELEASE_TABLET | ORAL | Status: DC
  Administered 2023-10-01 – 2023-10-03 (×3): 40 mg via ORAL
  Filled 2023-09-30 (×3): qty 1

## 2023-09-30 MED ORDER — ACYCLOVIR 200 MG PO CAPS
400.0000 mg | ORAL_CAPSULE | Freq: Two times a day (BID) | ORAL | Status: DC
  Administered 2023-09-30 – 2023-10-03 (×6): 400 mg via ORAL
  Filled 2023-09-30 (×6): qty 2

## 2023-09-30 MED ORDER — ONDANSETRON HCL 4 MG/2ML IJ SOLN
4.0000 mg | Freq: Four times a day (QID) | INTRAMUSCULAR | Status: DC | PRN
  Administered 2023-10-01: 4 mg via INTRAVENOUS
  Filled 2023-09-30: qty 2

## 2023-09-30 MED ORDER — NOREPINEPHRINE 4 MG/250ML-% IV SOLN
2.0000 ug/min | INTRAVENOUS | Status: DC
  Administered 2023-09-30: 2 ug/min via INTRAVENOUS
  Filled 2023-09-30 (×2): qty 250

## 2023-09-30 MED ORDER — ACYCLOVIR 400 MG PO TABS
400.0000 mg | ORAL_TABLET | Freq: Two times a day (BID) | ORAL | Status: DC
  Filled 2023-09-30: qty 1

## 2023-09-30 MED ORDER — POLYETHYLENE GLYCOL 3350 17 G PO PACK
17.0000 g | PACK | Freq: Every day | ORAL | Status: DC | PRN
  Administered 2023-10-02: 17 g via ORAL
  Filled 2023-09-30: qty 1

## 2023-09-30 MED ORDER — ACETAMINOPHEN 325 MG PO TABS: 650.0000 mg | ORAL_TABLET | Freq: Four times a day (QID) | ORAL | Status: DC | PRN

## 2023-09-30 MED ORDER — SODIUM CHLORIDE 0.9% FLUSH
10.0000 mL | Freq: Two times a day (BID) | INTRAVENOUS | Status: DC
  Administered 2023-09-30 – 2023-10-03 (×6): 10 mL

## 2023-09-30 MED ORDER — CALCIUM CARBONATE ANTACID 500 MG PO CHEW
1.0000 | CHEWABLE_TABLET | Freq: Three times a day (TID) | ORAL | Status: DC
  Administered 2023-10-01 – 2023-10-03 (×8): 200 mg via ORAL
  Filled 2023-09-30 (×8): qty 1

## 2023-09-30 MED ORDER — ADULT MULTIVITAMIN W/MINERALS CH
1.0000 | ORAL_TABLET | Freq: Every morning | ORAL | Status: DC
  Administered 2023-10-01 – 2023-10-03 (×3): 1 via ORAL
  Filled 2023-09-30 (×3): qty 1

## 2023-09-30 MED ORDER — MAGNESIUM SULFATE 2 GM/50ML IV SOLN
2.0000 g | INTRAVENOUS | Status: AC
  Administered 2023-10-01: 2 g via INTRAVENOUS
  Filled 2023-09-30: qty 50

## 2023-09-30 NOTE — ED Notes (Signed)
 This RN messaged MD about pt remaining hypotensive and could we re-evaluated bed status and order IV pressors as she has had multiple liters of fluid and calcium to attempt to fix BP and no improvement.

## 2023-09-30 NOTE — ED Notes (Signed)
 Patient placed on 2L [place] for comfort due to reporting SOB and sats not picking up good on pulse ox.

## 2023-09-30 NOTE — ED Notes (Signed)
 Pt was saturated in wet urine. This Clinical biochemist cleaned pt up.

## 2023-09-30 NOTE — H&P (Addendum)
 History and Physical:    Kathy [name]   MVH:[id number] DOB: 12-18-1940 DOA: 09/30/2023  Referring MD/provider: Alfonse Flavors, MD PCP: [name], MD   Patient coming from: [facility] nursing home  Chief Complaint: Low calcium and low blood pressure  History of Present Illness:   Kathy [name] is a 83 y.o. female with multiple medical problems including multiple myeloma, chronic hypotension on midodrine, hypocalcemia, anemia of chronic disease, suspected rheumatoid arthritis, history of E. coli UTI and bacteremia in November 2024, hypothyroidism, GERD, chronic peripheral edema, OSA, hypertension, hyperlipidemia.  She had an outpatient blood work done when she was found to be hypocalcemic and was advised to go to the ED for further management.  Reportedly, her blood pressure at the nursing home was low.  She has chronic low blood pressure and she is on midodrine.  Apparently, she was given midodrine but there was no improvement in her blood pressure.  She complained of general weakness.  She was subsequently referred to the ED for further management. She reported some dysuria and increased frequency but she said this is nothing new.  No cough, shortness of breath, chest pain, dizziness, leg, vomiting, diarrhea, abdominal pain, fever, chills   ED Course: BP was 74/51 on arrival in the ED but she was afebrile was not tachypneic or tachycardic.  Oxygen saturation was 100% on room air on arrival.  It later dropped to 87% on room air but this is suspected to be spurious. The patient was given 2 L of normal saline bolus.  She was also given IV calcium gluconate bolus and IV calcium chloride infusion.  ROS:   ROS all other systems reviewed were negative  Past Medical History:   Past Medical History:  Diagnosis Date   Abnormal findings on diagnostic imaging of heart and coronary circulation    Anemia    FROM BLEEDING ULCER   Anxiety    takes Alprazolam daily as needed    Arthritis    dx with RA 2017   Bariatric surgery status    Cataract    Diverticulosis    Gastro-esophageal reflux disease with esophagitis    Gastrojejunal ulcer with hemorrhage    Headache(784.0)    occasionally   History of blood transfusion    no abnormal reaction noted   History of bronchitis    last time many yrs ago   Hyperlipidemia    PT DENIES THIS DX -  ON NO MEDS AND NO ONE HAS TOLD HER   Hypertension    takes Amlodipine daily   Hypothyroidism    takes Synthroid daily   Insomnia    Joint pain    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   Localized edema    Nocturia    Numbness    occasionally left arm at night   Obesity    Osteoporosis    takes Fosamax weekly   Pain in joint involving pelvic region and thigh    Peripheral edema    takes Lasix daily as needed   PONV (postoperative nausea and vomiting)    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/2017   Rheumatoid arthritis, unspecified (HCC)    Rotator cuff arthropathy, 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   Sleep apnea    Phreesia 08/22/2020   Stomach ulcer    Thyroid disease    Phreesia 08/22/2020   Unspecified injury of muscle(s) and tendon(s) of the rotator cuff of left shoulder, subsequent encounter    Unspecified osteoarthritis, unspecified site  Wears glasses    Wears partial dentures     Past Surgical History:   Past Surgical History:  Procedure Laterality Date   ABDOMINAL HYSTERECTOMY     partial   cataract surgery Bilateral    CHOLECYSTECTOMY     COLONOSCOPY     ESOPHAGOGASTRODUODENOSCOPY (EGD) WITH PROPOFOL N/A 01/11/2022   Procedure: ESOPHAGOGASTRODUODENOSCOPY (EGD) WITH PROPOFOL;  Surgeon: Iva Boop, MD;  Location: [facility] ENDOSCOPY;  Service: Gastroenterology;  Laterality: N/A;   EYE SURGERY     CATARACTS BOTH   gastric bypass surgery     HOT HEMOSTASIS N/A 01/11/2022   Procedure: HOT HEMOSTASIS (ARGON PLASMA COAGULATION/BICAP);  Surgeon: Iva Boop, MD;  Location: [facility]  ENDOSCOPY;  Service: Gastroenterology;  Laterality: N/A;   IR IMAGING GUIDED PORT INSERTION  05/23/2023   JOINT REPLACEMENT Bilateral    hip   REVERSE SHOULDER ARTHROPLASTY Right 05/11/2016   Procedure: REVERSE SHOULDER ARTHROPLASTY;  Surgeon: Teryl Lucy, MD;  Location: [facility] OR;  Service: Orthopedics;  Laterality: Right;   REVISION TOTAL HIP ARTHROPLASTY Left 10/03/2013   DR Turner Daniels   ROTATOR CUFF REPAIR     SCLEROTHERAPY  01/11/2022   Procedure: Susa Day;  Surgeon: Iva Boop, MD;  Location: [facility] ENDOSCOPY;  Service: Gastroenterology;;   TOTAL HIP REVISION Left 10/03/2013   Procedure: TOTAL HIP REVISION- left;  Surgeon: Nestor Lewandowsky, MD;  Location: [facility] OR;  Service: Orthopedics;  Laterality: Left;   TOTAL KNEE ARTHROPLASTY Left 11/29/2017   Procedure: LEFT TOTAL KNEE ARTHROPLASTY;  Surgeon: Teryl Lucy, MD;  Location: [facility] OR;  Service: Orthopedics;  Laterality: Left;   TOTAL SHOULDER ARTHROPLASTY Left 11/09/2016   Procedure: TOTAL REVERSE SHOULDER ARTHROPLASTY;  Surgeon: Teryl Lucy, MD;  Location: [facility] OR;  Service: Orthopedics;  Laterality: Left;   TOTAL SHOULDER REPLACEMENT Left 10/2016   TOTAL THYROIDECTOMY     UPPER GASTROINTESTINAL ENDOSCOPY      Social History:   Social History   Socioeconomic History   Marital status: Divorced    Spouse name: Not on file   Number of children: 0   Years of education: Not on file   Highest education level: Not on file  Occupational History   Occupation: disabled  Tobacco Use   Smoking status: Never    Passive exposure: Never   Smokeless tobacco: Never  Vaping Use   Vaping status: Never Used  Substance and Sexual Activity   Alcohol use: No   Drug use: No   Sexual activity: Not Currently    Birth control/protection: Surgical  Other Topics Concern   Not on file  Social History Narrative   Lives alone : "at [facility] for rehab right now."   Social Drivers of Health   Financial Resource Strain: Low Risk   (09/14/2021)   Overall Financial Resource Strain (CARDIA)    Difficulty of Paying Living Expenses: Not hard at all  Food Insecurity: Patient Unable To Answer (05/12/2023)   Hunger Vital Sign    Worried About Programme researcher, broadcasting/film/video in the Last Year: Patient unable to answer    Ran Out of Food in the Last Year: Patient unable to answer  Transportation Needs: Patient Unable To Answer (05/12/2023)   PRAPARE - Transportation    Lack of Transportation (Medical): Patient unable to answer    Lack of Transportation (Non-Medical): Patient unable to answer  Physical Activity: Sufficiently Active (09/14/2021)   Exercise Vital Sign    Days of Exercise per Week: 6 days    Minutes of Exercise  per Session: 40 min  Stress: No Stress Concern Present (09/14/2021)   [employer] of Occupational Health - Occupational Stress Questionnaire    Feeling of Stress : Not at all  Social Connections: Moderately Isolated (09/14/2021)   Social Connection and Isolation Panel [NHANES]    Frequency of Communication with Friends and Family: More than three times a week    Frequency of Social Gatherings with Friends and Family: More than three times a week    Attends Religious Services: More than 4 times per year    Active Member of [employer] or Organizations: No    Attends Banker Meetings: Never    Marital Status: Divorced  Catering manager Violence: Patient Unable To Answer (05/12/2023)   Humiliation, Afraid, Rape, and Kick questionnaire    Fear of Current or Ex-Partner: Patient unable to answer    Emotionally Abused: Patient unable to answer    Physically Abused: Patient unable to answer    Sexually Abused: Patient unable to answer    Allergies   Macrobid [nitrofurantoin]  Family history:   Family History  Problem Relation Age of Onset   Diabetes Mother    Congestive Heart Failure Mother    Lung cancer Father    Thrombosis Sister    CAD Brother        CABG   Colon cancer Neg Hx    Esophageal  cancer Neg Hx    Stomach cancer Neg Hx    Rectal cancer Neg Hx     Current Medications:   Prior to Admission medications   Medication Sig Start Date End Date Taking? Authorizing Provider  acetaminophen (TYLENOL) 500 MG tablet Take 1 tablet (500 mg total) by mouth every 6 (six) hours as needed for mild pain, headache or fever (or Fever >/= 101). 03/16/23   Shon Hale, MD  acyclovir (ZOVIRAX) 400 MG tablet Take 1 tablet (400 mg total) by mouth 2 (two) times daily. 04/26/23   Doreatha Massed, MD  Adalimumab (HUMIRA PEN) 40 MG/0.4ML PNKT Inject 40 mg into the skin every Saturday.    [provider]  Aspirin 81 MG CAPS Take 1 capsule by mouth daily.    [provider]  calcium carbonate (TUMS - DOSED IN MG ELEMENTAL CALCIUM) 500 MG chewable tablet Chew 1 tablet (200 mg of elemental calcium total) by mouth 3 (three) times daily with meals. 06/19/23   Catarina Hartshorn, MD  denosumab (PROLIA) 60 MG/ML SOSY injection 60mg  Subcutaneous every 6 months 180 days 04/06/23     ferrous sulfate 325 (65 FE) MG EC tablet Take 1 tablet (325 mg total) by mouth daily. 03/16/23   Shon Hale, MD  lenalidomide (REVLIMID) 10 MG capsule Take 1 capsule (10 mg total) by mouth daily. Take 10 mg by mouth for 21 days. Take 21 days on, 7 days off every 28 days 09/16/23   Doreatha Massed, MD  levothyroxine (SYNTHROID) 137 MCG tablet Take 0.5 tablets (68.5 mcg total) by mouth daily at 6 (six) AM. 05/19/23   Marrion Coy, MD  magnesium oxide (MAG-OX) 400 (240 Mg) MG tablet Take 1 tablet (400 mg total) by mouth daily. 06/20/23   Catarina Hartshorn, MD  midodrine (PROAMATINE) 2.5 MG tablet Take 3 tablets (7.5 mg total) by mouth 3 (three) times daily with meals. 06/19/23   Catarina Hartshorn, MD  Multiple Vitamin (MULTIVITAMIN WITH MINERALS) TABS tablet Take 1 tablet by mouth every morning.    [provider]  naloxone [facility]) nasal spray 4 mg/0.1 mL Place  1 spray into the nose as needed (opioid reversal).     [provider]  Omega-3 Fatty Acids (FISH OIL) 1000 MG CAPS Take 1 capsule by mouth daily. 03/17/23   [provider]  omeprazole (PRILOSEC) 40 MG capsule Take 1 capsule (40 mg total) by mouth daily before breakfast. Open capsule and swallow granules with liquid or applesauce 04/15/22   Iva Boop, MD  ondansetron (ZOFRAN) 4 MG tablet Take 4 mg by mouth every 6 (six) hours as needed for nausea or vomiting.    [provider]  oxybutynin (DITROPAN-XL) 10 MG 24 hr tablet Take 10 mg by mouth at bedtime. 03/16/23   [provider]  polyethylene glycol (MIRALAX / GLYCOLAX) 17 g packet Take 17 g by mouth daily. 04/13/23   Sherryll Burger, Pratik D, DO  traMADol (ULTRAM) 50 MG tablet Take 1 tablet (50 mg total) by mouth every 8 (eight) hours as needed for moderate pain (pain score 4-6). 06/19/23   Catarina Hartshorn, MD    Physical Exam:   Vitals:   09/30/23 1500 09/30/23 1600 09/30/23 1630 09/30/23 1700  BP: (!) 72/58 120/88 (!) 79/47 (!) 72/43  Pulse: 86 (!) 54 75 72  Resp: 16 (!) 31 17 17   Temp:      TempSrc:      SpO2: 92%  96% 100%  Weight:      Height:         Physical Exam: Blood pressure (!) 72/43, pulse 72, temperature 98.6 F (37 C), temperature source Oral, resp. rate 17, height 5\' 1"  (1.549 m), weight 70.3 kg, SpO2 100%. Gen: No acute distress. Head: Normocephalic, atraumatic. Eyes: Pupils equal, round and reactive to light. Extraocular movements intact.  Sclerae nonicteric.  Mouth: Moist mucous membranes Neck: Supple, no thyromegaly, no lymphadenopathy, no jugular venous distention. Chest: Lungs are clear to auscultation with good air movement. No rales, rhonchi or wheezes.  CV: Heart sounds are regular with an S1, S2. No murmurs, rubs or gallops.  Abdomen: Soft, nontender, nondistended with normal active bowel sounds. No palpable masses. Extremities: Significant peripheral edema extending from the thighs to the bilateral feet.  No erythema or tenderness.   Skin: Warm and dry.  Neuro: Alert and oriented times 3; grossly nonfocal.  Psych: Insight is good and judgment is appropriate. Mood and affect normal.   Data Review:    Labs: Basic Metabolic Panel: Recent Labs  Lab 09/30/23 1334  NA 138  K 5.4*  CL 108  CO2 22  GLUCOSE 120*  BUN 18  CREATININE 1.09*  CALCIUM 6.0*  MG 1.7  PHOS 2.6   Liver Function Tests: Recent Labs  Lab 09/30/23 1334  AST 23  ALT 19  ALKPHOS 185*  BILITOT 1.2  PROT 4.4*  ALBUMIN <1.5*   No results for input(s): "LIPASE", "AMYLASE" in the last 168 hours. No results for input(s): "AMMONIA" in the last 168 hours. CBC: Recent Labs  Lab 09/30/23 1334  WBC 4.7  NEUTROABS 3.0  HGB 8.3*  HCT 23.1*  MCV 93.9  PLT 151   Cardiac Enzymes: No results for input(s): "CKTOTAL", "CKMB", "CKMBINDEX", "TROPONINI" in the last 168 hours.  BNP (last 3 results) No results for input(s): "PROBNP" in the last 8760 hours. CBG: No results for input(s): "GLUCAP" in the last 168 hours.  Urinalysis    Component Value Date/Time   COLORURINE YELLOW 06/16/2023 1615   APPEARANCEUR HAZY (A) 06/16/2023 1615   LABSPEC 1.008 06/16/2023 1615   PHURINE 6.0 06/16/2023 1615  GLUCOSEU NEGATIVE 06/16/2023 1615   HGBUR MODERATE (A) 06/16/2023 1615   BILIRUBINUR NEGATIVE 06/16/2023 1615   KETONESUR NEGATIVE 06/16/2023 1615   PROTEINUR 30 (A) 06/16/2023 1615   UROBILINOGEN 0.2 09/26/2013 1042   NITRITE NEGATIVE 06/16/2023 1615   LEUKOCYTESUR LARGE (A) 06/16/2023 1615      Radiographic Studies: DG Chest Portable 1 View Result Date: 09/30/2023 CLINICAL DATA:  Hypertension, weakness. EXAM: PORTABLE CHEST 1 VIEW COMPARISON:  May 11, 2023. FINDINGS: The heart size and mediastinal contours are within normal limits. No acute pulmonary disease is noted. 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 Right internal jugular Port-A-Cath is noted. IMPRESSION: No active disease. Electronically Signed   By: Lupita Raider M.D.    On: 09/30/2023 15:04    EKG: No EKG on file   Assessment/Plan:   Principal Problem:   Hypotension Active Problems:   Multiple myeloma (HCC)   Hypocalcemia   Hypoalbuminemia   Hyperkalemia    Body mass index is 29.29 kg/m.    Hypotension: Admit to medical telemetry.  Patient was given 2 L of normal saline bolus in the ED.  Start midodrine 10 mg 3 times daily. BP is still very low (67/45) after IV fluids and much lower than her BP has been in the past. She will be started on IV Levophed infusion because she already has underlying fluid overload. No clear evidence of infection at this time.  Lactic acid level was normal Blood cultures were obtained in the ED.   Obtain urinalysis because of reported dysuria and increased urinary frequency although patient says this is nothing new. Start empiric IV ceftriaxone in case she has occult infection.   Hypocalcemia: She was given IV calcium gluconate followed by IV calcium chloride infusion in the ED. Measured calcium was 6 and albumin was less than 1.5.  Using an albumin level of 1.5, corrected calcium level is 8 mg/dL.Marland Kitchen Chart review shows that patient was given IV calcium gluconate for calcium level of 5.6 on 09/22/2023.   Mild hyperkalemia: Potassium is 5.4.  Hopefully potassium will normalize after IV fluids.  Of note, potassium was low (2.4) on 09/22/2023.  I will be cautious with using potassium lowering agents at this time.   IgG kappa multiple myeloma: She is on Darzalex monthly, lenalidomide.  She had Darzalex on 09/22/2023.  She follows with Dr. Dr. Ellin Saba, oncologist.   Severe hypoalbuminemia: Albumin level less than 1.5.  This is probably from multiple myeloma.  Encourage adequate oral intake.   Significant bilateral lower extremity peripheral edema: This is probably from severe hypoalbuminemia.   Anemia of chronic disease: Hemoglobin is 8.3.  Hemoglobin was 9.4 on 09/22/2023.  Monitor H&H.  No indication for blood  transfusion at this time.    Other information:   DVT prophylaxis: Lovenox  Code Status: DNR.  CODE STATUS was discussed with the patient.  She said she is a DNR.  I spoke with Mr. Jonny Ruiz Furniss, brother, who also confirmed that patient is a DNR based on their previous discussions. Family Communication: Plan discussed with Mr. Jonny Ruiz Weitzman, brother, over the phone Disposition Plan: Plan to discharge to SNF Consults called: None Admission status: Observation     Troyce Febo [facility] Pager: Please check [email or web address]   How to contact the [facility] Attending or Consulting provider 7A - 7P or covering provider during after hours 7P -7A, for this patient?   Check the care team in [facility] and look for a) attending/consulting [facility] provider listed and b) the [facility]  team listed Log into [email or web address] and use [place]'s universal password to access. If you do not have the password, please contact the hospital operator. Locate the [facility] provider you are looking for under [facility] and page to a number that you can be directly reached. If you still have difficulty reaching the provider, please page the [facility] (Director on Call) for the Hospitalists listed on amion for assistance.  09/30/2023, 5:51 PM

## 2023-09-30 NOTE — ED Notes (Signed)
 Phleb at bedside to get 2 sets of blood cultures since unable to straight stick and can't pull from port a cath.

## 2023-09-30 NOTE — ED Provider Notes (Signed)
 [facility] Provider Note    Event Date/Time   First MD Initiated Contact with Patient 09/30/23 1329     (approximate)   History   Chief Complaint: Abnormal Lab   HPI  Kathy [name] is a 83 y.o. female with a history of peripheral edema, hypertension, multiple myeloma receiving chemotherapy who comes the ED due to generalized weakness, found to be hypocalcemic on outpatient labs.  Previously had low K, mag, calcium, treated at her oncology office.  Currently residing at [facility].          Physical Exam   Triage Vital Signs: ED Triage Vitals  Encounter Vitals Group     BP 09/30/23 1315 (!) 71/57     Systolic BP Percentile --      Diastolic BP Percentile --      Pulse Rate 09/30/23 1315 93     Resp 09/30/23 1315 14     Temp 09/30/23 1324 98.6 F (37 C)     Temp Source 09/30/23 1324 Oral     SpO2 09/30/23 1315 97 %     Weight 09/30/23 1323 155 lb (70.3 kg)     Height 09/30/23 1323 5\' 1"  (1.549 m)     Head Circumference --      Peak Flow --      Pain Score 09/30/23 1323 0     Pain Loc --      Pain Education --      Exclude from Growth Chart --     Most recent vital signs: Vitals:   09/30/23 1355 09/30/23 1500  BP: (!) 74/51 (!) 72/58  Pulse: 88 86  Resp: 17 16  Temp:    SpO2: 100% 92%    General: Awake, no distress.  CV:  Good peripheral perfusion.  Regular rate rhythm. Resp:  Normal effort.  Clear to auscultation bilaterally Abd:  No distention.  Soft nontender Other:  2+ pitting edema bilaterally nontender   ED Results / Procedures / Treatments   Labs (all labs ordered are listed, but only abnormal results are displayed) Labs Reviewed  COMPREHENSIVE METABOLIC PANEL - Abnormal; Notable for the following components:      Result Value   Potassium 5.4 (*)    Glucose, Bld 120 (*)    Creatinine, Ser 1.09 (*)    Calcium 6.0 (*)    Total Protein 4.4 (*)    Albumin <1.5 (*)    Alkaline Phosphatase 185 (*)     GFR, Estimated 51 (*)    All other components within normal limits  CBC WITH DIFFERENTIAL/PLATELET - Abnormal; Notable for the following components:   RBC 2.46 (*)    Hemoglobin 8.3 (*)    HCT 23.1 (*)    RDW 17.8 (*)    All other components within normal limits  RESP PANEL BY RT-PCR (RSV, FLU A&B, COVID)  [id number]  CULTURE, BLOOD (ROUTINE X 2)  CULTURE, BLOOD (ROUTINE X 2)  MAGNESIUM  PHOSPHORUS  LACTIC ACID, PLASMA  LACTIC ACID, PLASMA  URINALYSIS, W/ REFLEX TO CULTURE (INFECTION SUSPECTED)     EKG    RADIOLOGY Chest x-ray interpreted by me, unremarkable.  Radiology report reviewed   PROCEDURES:  .Critical Care  Performed by: Sharman Cheek, MD Authorized by: Sharman Cheek, MD   Critical care provider statement:    Critical care time (minutes):  35   Critical care time was exclusive of:  Separately billable procedures and treating other patients   Critical care was necessary  to treat or prevent imminent or life-threatening deterioration of the following conditions:  Shock and metabolic crisis   Critical care was time spent personally by me on the following activities:  Development of treatment plan with patient or surrogate, discussions with consultants, evaluation of patient's response to treatment, examination of patient, obtaining history from patient or surrogate, ordering and performing treatments and interventions, ordering and review of laboratory studies, ordering and review of radiographic studies, pulse oximetry, re-evaluation of patient's condition and review of old charts   Care discussed with: admitting provider      MEDICATIONS ORDERED IN ED: Medications  sodium chloride 0.9 % bolus 1,000 mL (has no administration in time range)  calcium chloride 1 g in sodium chloride 0.9 % 100 mL IVPB (has no administration in time range)  sodium chloride 0.9 % bolus 1,000 mL (0 mLs Intravenous Stopped 09/30/23 1505)  calcium gluconate inj 10% (1 g) URGENT USE  ONLY! (1 g Intravenous Given 09/30/23 1503)     IMPRESSION / MDM / ASSESSMENT AND PLAN / ED COURSE  I reviewed the triage vital signs and the nursing notes.  DDx: Electrolyte derangement, AKI, dehydration, anemia, COVID, influenza, sepsis  Patient's presentation is most consistent with acute presentation with potential threat to life or bodily function.  Chemotherapy/multiple myeloma patient presents with weakness, found to be hypotensive.  Calcium is 6.0, albumin is extremely low.  I think her edema is due to severe hypoalbuminemia.  No improvement with 1 L fluid bolus, and lungs remain clear but patient does report feeling mildly short of breath.  Will give calcium chloride, if no improvement in blood pressure will need to start vasopressors.  Will plan for admission.   Clinical Course as of 09/30/23 1618  Fri Sep 30, 2023  1617 Bp improved after calcium gluconate and NS bolus. Will start CaCl IVPB. Will need to admit due to hypotension [PS]    Clinical Course User Index [PS] Sharman Cheek, MD     FINAL CLINICAL IMPRESSION(S) / ED DIAGNOSES   Final diagnoses:  Hypocalcemia  Hypotension, unspecified hypotension type  Multiple myeloma not having achieved remission [facility])     Rx / DC Orders   ED Discharge Orders     None        Note:  This document was prepared using Dragon voice recognition software and may include unintentional dictation errors.   Sharman Cheek, MD 09/30/23 [phone]

## 2023-09-30 NOTE — ED Notes (Addendum)
 This Rn to bedside to introduce self to pt and get report from [employer]. Pt is alert but confused at baseline. Pt is here for weakness and low BP.  Pt has large amount of +4 pitting edema to both bilateral lower legs. Pt has received one liter of NS bolus for BP with no improvement.

## 2023-09-30 NOTE — ED Notes (Signed)
IV team at bedside,  

## 2023-10-01 ENCOUNTER — Other Ambulatory Visit: Payer: Self-pay

## 2023-10-01 DIAGNOSIS — I959 Hypotension, unspecified: Secondary | ICD-10-CM | POA: Diagnosis not present

## 2023-10-01 LAB — GLUCOSE, CAPILLARY
Glucose-Capillary: 107 mg/dL — ABNORMAL HIGH (ref 70–99)
Glucose-Capillary: 75 mg/dL (ref 70–99)
Glucose-Capillary: 85 mg/dL (ref 70–99)
Glucose-Capillary: 89 mg/dL (ref 70–99)
Glucose-Capillary: 90 mg/dL (ref 70–99)
Glucose-Capillary: 92 mg/dL (ref 70–99)
Glucose-Capillary: 94 mg/dL (ref 70–99)

## 2023-10-01 LAB — CBC
HCT: 21.3 % — ABNORMAL LOW (ref 36.0–46.0)
Hemoglobin: 7.7 g/dL — ABNORMAL LOW (ref 12.0–15.0)
MCH: 34.1 pg — ABNORMAL HIGH (ref 26.0–34.0)
MCHC: 36.2 g/dL — ABNORMAL HIGH (ref 30.0–36.0)
MCV: 94.2 fL (ref 80.0–100.0)
Platelets: 107 10*3/uL — ABNORMAL LOW (ref 150–400)
RBC: 2.26 MIL/uL — ABNORMAL LOW (ref 3.87–5.11)
RDW: 17.7 % — ABNORMAL HIGH (ref 11.5–15.5)
WBC: 3.9 10*3/uL — ABNORMAL LOW (ref 4.0–10.5)
nRBC: 0 % (ref 0.0–0.2)

## 2023-10-01 LAB — BASIC METABOLIC PANEL
Anion gap: 10 (ref 5–15)
BUN: 16 mg/dL (ref 8–23)
CO2: 20 mmol/L — ABNORMAL LOW (ref 22–32)
Calcium: 6.8 mg/dL — ABNORMAL LOW (ref 8.9–10.3)
Chloride: 109 mmol/L (ref 98–111)
Creatinine, Ser: 1.04 mg/dL — ABNORMAL HIGH (ref 0.44–1.00)
GFR, Estimated: 54 mL/min — ABNORMAL LOW (ref >60)
Glucose, Bld: 100 mg/dL — ABNORMAL HIGH (ref 70–99)
Potassium: 4.8 mmol/L (ref 3.5–5.1)
Sodium: 139 mmol/L (ref 135–145)

## 2023-10-01 LAB — COMPREHENSIVE METABOLIC PANEL
ALT: 16 U/L (ref 0–44)
AST: 22 U/L (ref 15–41)
Albumin: 2.1 g/dL — ABNORMAL LOW (ref 3.5–5.0)
Alkaline Phosphatase: 149 U/L — ABNORMAL HIGH (ref 38–126)
Anion gap: 8 (ref 5–15)
BUN: 17 mg/dL (ref 8–23)
CO2: 22 mmol/L (ref 22–32)
Calcium: 6.7 mg/dL — ABNORMAL LOW (ref 8.9–10.3)
Chloride: 109 mmol/L (ref 98–111)
Creatinine, Ser: 1.1 mg/dL — ABNORMAL HIGH (ref 0.44–1.00)
GFR, Estimated: 50 mL/min — ABNORMAL LOW (ref >60)
Glucose, Bld: 119 mg/dL — ABNORMAL HIGH (ref 70–99)
Potassium: 4.6 mmol/L (ref 3.5–5.1)
Sodium: 139 mmol/L (ref 135–145)
Total Bilirubin: 1.2 mg/dL (ref 0.0–1.2)
Total Protein: 4.6 g/dL — ABNORMAL LOW (ref 6.5–8.1)

## 2023-10-01 LAB — TSH: TSH: 0.087 u[IU]/mL — ABNORMAL LOW (ref 0.350–4.500)

## 2023-10-01 LAB — BLOOD GAS, VENOUS

## 2023-10-01 LAB — CORTISOL-AM, BLOOD: Cortisol - AM: 15.9 ug/dL (ref 6.7–22.6)

## 2023-10-01 MED ORDER — ASPIRIN 81 MG PO TBEC
81.0000 mg | DELAYED_RELEASE_TABLET | Freq: Every day | ORAL | Status: DC
  Administered 2023-10-01 – 2023-10-03 (×3): 81 mg via ORAL
  Filled 2023-10-01 (×3): qty 1

## 2023-10-01 MED ORDER — ALBUMIN HUMAN 25 % IV SOLN
25.0000 g | Freq: Four times a day (QID) | INTRAVENOUS | Status: AC
  Administered 2023-10-01 (×4): 25 g via INTRAVENOUS
  Filled 2023-10-01 (×4): qty 100

## 2023-10-01 MED ORDER — ORAL CARE MOUTH RINSE: 15.0000 mL | OROMUCOSAL | Status: DC | PRN

## 2023-10-01 MED ORDER — DEXTROSE-SODIUM CHLORIDE 5-0.9 % IV SOLN: INTRAVENOUS | Status: AC

## 2023-10-01 MED ORDER — KCL IN DEXTROSE-NACL 20-5-0.9 MEQ/L-%-% IV SOLN: INTRAVENOUS | Status: DC

## 2023-10-01 MED ORDER — METOPROLOL TARTRATE 5 MG/5ML IV SOLN
INTRAVENOUS | Status: AC
  Filled 2023-10-01: qty 5

## 2023-10-01 MED ORDER — ALBUMIN HUMAN 25 % IV SOLN
INTRAVENOUS | Status: AC
Start: 2023-10-01 — End: 2023-10-01
  Administered 2023-10-01: 25 g via INTRAVENOUS
  Filled 2023-10-01: qty 100

## 2023-10-01 MED ORDER — ENSURE MAX PROTEIN PO LIQD
11.0000 [oz_av] | Freq: Every day | ORAL | Status: DC
  Administered 2023-10-02 – 2023-10-03 (×2): 11 [oz_av] via ORAL
  Filled 2023-10-01: qty 330

## 2023-10-01 MED ORDER — MIDODRINE HCL 5 MG PO TABS
10.0000 mg | ORAL_TABLET | Freq: Three times a day (TID) | ORAL | Status: DC
  Administered 2023-10-01 – 2023-10-03 (×9): 10 mg via ORAL
  Filled 2023-10-01 (×9): qty 2

## 2023-10-01 MED ORDER — ALBUMIN HUMAN 25 % IV SOLN: 25.0000 g | Freq: Once | INTRAVENOUS | Status: AC

## 2023-10-01 MED ORDER — CALCIUM GLUCONATE-NACL 1-0.675 GM/50ML-% IV SOLN
1.0000 g | Freq: Once | INTRAVENOUS | Status: AC
  Administered 2023-10-01: 1000 mg via INTRAVENOUS
  Filled 2023-10-01: qty 50

## 2023-10-01 MED ORDER — DEXTROSE 50 % IV SOLN: 12.5000 g | Freq: Once | INTRAVENOUS | Status: AC

## 2023-10-01 MED ORDER — CALCIUM GLUCONATE 10 % IV SOLN: 1.0000 g | Freq: Once | INTRAVENOUS | Status: DC

## 2023-10-01 MED ORDER — DEXTROSE 50 % IV SOLN
INTRAVENOUS | Status: AC
  Administered 2023-10-01: 12.5 g via INTRAVENOUS
  Filled 2023-10-01: qty 50

## 2023-10-01 MED ORDER — COSYNTROPIN 0.25 MG IJ SOLR
0.2500 mg | Freq: Once | INTRAMUSCULAR | Status: AC
  Administered 2023-10-02: 0.25 mg via INTRAVENOUS
  Filled 2023-10-01: qty 0.25

## 2023-10-01 NOTE — Progress Notes (Signed)
 Progress Note    Kathy [name]  EAV:[id number] DOB: 1940/10/03  DOA: 09/30/2023 PCP: [name], MD      Brief Narrative:    Medical records reviewed and are as summarized below:  Kathy [name] is a 83 y.o. female  with multiple medical problems including multiple myeloma, chronic hypotension on midodrine, hypocalcemia, anemia of chronic disease, suspected rheumatoid arthritis, history of E. coli UTI and bacteremia in November 2024, hypothyroidism, GERD, chronic peripheral edema, OSA, hypertension, hyperlipidemia.  She had an outpatient blood work done when she was found to be hypocalcemic and was advised to go to the ED for further management.  Reportedly, her blood pressure at the nursing home was low.  She has chronic low blood pressure and she is on midodrine.  Apparently, she was given midodrine but there was no improvement in her blood pressure.  She complained of general weakness.  She was subsequently referred to the ED for further management. She reported some dysuria and increased frequency but she said this is nothing new.  No cough, shortness of breath, chest pain, dizziness, leg, vomiting, diarrhea, abdominal pain, fever, chills   ED Course: BP was 74/51 on arrival in the ED but she was afebrile was not tachypneic or tachycardic.  Oxygen saturation was 100% on room air on arrival.  It later dropped to 87% on room air but this is suspected to be spurious. The patient was given 2 L of normal saline bolus.  She was also given IV calcium gluconate bolus and IV calcium chloride infusion.     Assessment/Plan:   Principal Problem:   Hypotension Active Problems:   Multiple myeloma (HCC)   Hypocalcemia   Hypoalbuminemia   Hyperkalemia   Body mass index is 29.29 kg/m.   Hypotension: Patient was admitted to stepdown unit.  She was treated with IV Levophed infusion for refractory hypotension.  Wean off Levophed infusion and monitor BP closely. She was also  given IV albumin infusion. Continue midodrine. No growth on blood cultures.  Procalcitonin level was normal.  MRSA screen negative. Urinalysis not convincing for UTI. Cortisol level 15.9.  Check ACTH stimulation test Continue empiric IV ceftriaxone for now.     Hypocalcemia: She was given IV calcium gluconate followed by IV calcium chloride infusion in the ED. Measured calcium today 6.7 but corrected calcium is 8.7.  Chart review shows that patient was given IV calcium gluconate for calcium level of 5.6 on 09/22/2023.     Mild hyperkalemia: Improved      IgG kappa multiple myeloma: She is on Darzalex monthly, lenalidomide.  She had Darzalex on 09/22/2023.   She is also on low-dose aspirin and acyclovir. She follows with Dr. Dr. Ellin Saba, oncologist.     Severe hypoalbuminemia: Albumin level less than 1.5.  This is probably from multiple myeloma.  She had IV albumin infusion today. Encourage adequate oral intake.     Significant bilateral lower extremity peripheral edema: This is probably from severe hypoalbuminemia.     Anemia of chronic disease: Hemoglobin down from 8.3-7.7..  Hemoglobin was 9.4 on 09/22/2023.   Monitor H&H.  Consider blood transfusion if hemoglobin continues to drop.        CRITICAL CARE Performed by: Lurene Shadow   Total critical care time: 35 minutes  Critical care time was exclusive of separately billable procedures and treating other patients.  Critical care was necessary to treat or prevent imminent or life-threatening deterioration.  Critical care was time spent  personally by me on the following activities: development of treatment plan with patient and/or surrogate as well as nursing, discussions with consultants, evaluation of patient's response to treatment, examination of patient, obtaining history from patient or surrogate, ordering and performing treatments and interventions, ordering and review of laboratory studies, ordering and review of  radiographic studies, pulse oximetry and re-evaluation of patient's condition. car         Diet Order             Diet regular Room service appropriate? Yes; Fluid consistency: Thin  Diet effective now                            Consultants: None  Procedures: None    Medications:    acyclovir  400 mg Oral BID   calcium carbonate  1 tablet Oral TID WC   Chlorhexidine Gluconate Cloth  6 each Topical Daily   enoxaparin (LOVENOX) injection  40 mg Subcutaneous Q24H   levothyroxine  68.5 mcg Oral Q0600   midodrine  10 mg Oral TID with meals   multivitamin with minerals  1 tablet Oral q morning   oxybutynin  10 mg Oral QHS   pantoprazole  40 mg Oral BH-q7a   Ensure Max Protein  11 oz Oral Daily   sodium chloride flush  10-40 mL Intracatheter Q12H   Continuous Infusions:  sodium chloride Stopped (10/01/23 0023)   albumin human 60 mL/hr at 10/01/23 1030   cefTRIAXone (ROCEPHIN)  IV Stopped (09/30/23 1956)   dextrose 5 % and 0.9 % NaCl 100 mL/hr at 10/01/23 1115   norepinephrine (LEVOPHED) Adult infusion Stopped (10/01/23 0839)     Anti-infectives (From admission, onward)    Start     Dose/Rate Route Frequency Ordered Stop   09/30/23 2200  acyclovir (ZOVIRAX) tablet 400 mg  Status:  Discontinued        400 mg Oral 2 times daily 09/30/23 1752 09/30/23 1834   09/30/23 2200  acyclovir (ZOVIRAX) 200 MG capsule 400 mg        400 mg Oral 2 times daily 09/30/23 1834     09/30/23 1800  cefTRIAXone (ROCEPHIN) 1 g in sodium chloride 0.9 % 100 mL IVPB  Status:  Discontinued        1 g 200 mL/hr over 30 Minutes Intravenous Every 24 hours 09/30/23 1747 09/30/23 1749   09/30/23 1800  cefTRIAXone (ROCEPHIN) 1 g in sodium chloride 0.9 % 100 mL IVPB        1 g 200 mL/hr over 30 Minutes Intravenous Every 24 hours 09/30/23 1749                Family Communication/Anticipated D/C date and plan/Code Status   DVT prophylaxis: enoxaparin (LOVENOX) injection 40 mg  Start: 09/30/23 2200     Code Status: Limited: Do not attempt resuscitation (DNR) -DNR-LIMITED -Do Not Intubate/DNI   Family Communication: None Disposition Plan: Plan to discharge to [facility] nursing home   Status is: Inpatient Remains inpatient appropriate because: Hypotension       Subjective:   Interval events noted.  No shortness of breath, chest pain,, vomiting, diarrhea or abdominal pain.  No urinary symptoms at this time.  Objective:    Vitals:   10/01/23 1000 10/01/23 1030 10/01/23 1100 10/01/23 1130  BP: 96/66 (!) 87/51 (!) 96/56 96/71  Pulse: (!) 42     Resp: (!) 25 13 14  (!) 22  Temp:  98.1 F (36.7 C)  TempSrc:    Oral  SpO2: 92%   94%  Weight:      Height:       No data found.   Intake/Output Summary (Last 24 hours) at 10/01/2023 1323 Last data filed at 10/01/2023 1030 Gross per 24 hour  Intake 2641.12 ml  Output 800 ml  Net 1841.12 ml   Filed Weights   09/30/23 1323  Weight: 70.3 kg    Exam:  GEN: NAD SKIN: Warm and dry EYES: No pallor or icterus ENT: MMM CV: RRR PULM: CTA B ABD: soft, obese, NT, +BS CNS: AAO x 3, non focal EXT: Significant bilateral lower extremity edema, no erythema or tenderness        Data Reviewed:   I have personally reviewed following labs and imaging studies:  Labs: Labs show the following:   Basic Metabolic Panel: Recent Labs  Lab 09/30/23 1334 10/01/23 0115 10/01/23 0456  NA 138 139 139  K 5.4* 4.6 4.8  CL 108 109 109  CO2 22 22 20*  GLUCOSE 120* 119* 100*  BUN 18 17 16   CREATININE 1.09* 1.10* 1.04*  CALCIUM 6.0* 6.7* 6.8*  MG 1.7  --   --   PHOS 2.6  --   --    GFR Estimated Creatinine Clearance: 38 mL/min (A) (by C-G formula based on SCr of 1.04 mg/dL (H)). Liver Function Tests: Recent Labs  Lab 09/30/23 1334 10/01/23 0115  AST 23 22  ALT 19 16  ALKPHOS 185* 149*  BILITOT 1.2 1.2  PROT 4.4* 4.6*  ALBUMIN <1.5* 2.1*   No results for input(s): "LIPASE", "AMYLASE" in  the last 168 hours. No results for input(s): "AMMONIA" in the last 168 hours. Coagulation profile No results for input(s): "INR", "PROTIME" in the last 168 hours.  CBC: Recent Labs  Lab 09/30/23 1334 10/01/23 0456  WBC 4.7 3.9*  NEUTROABS 3.0  --   HGB 8.3* 7.7*  HCT 23.1* 21.3*  MCV 93.9 94.2  PLT 151 107*   Cardiac Enzymes: No results for input(s): "CKTOTAL", "CKMB", "CKMBINDEX", "TROPONINI" in the last 168 hours. BNP (last 3 results) No results for input(s): "PROBNP" in the last 8760 hours. CBG: Recent Labs  Lab 10/01/23 0006 10/01/23 0649 10/01/23 0751 10/01/23 1140  GLUCAP 75 92 89 90   D-Dimer: No results for input(s): "DDIMER" in the last 72 hours. Hgb A1c: No results for input(s): "HGBA1C" in the last 72 hours. Lipid Profile: No results for input(s): "CHOL", "HDL", "LDLCALC", "TRIG", "CHOLHDL", "LDLDIRECT" in the last 72 hours. Thyroid function studies: Recent Labs    10/01/23 0456  TSH 0.087*   Anemia work up: No results for input(s): "VITAMINB12", "FOLATE", "FERRITIN", "TIBC", "IRON", "RETICCTPCT" in the last 72 hours. Sepsis Labs: Recent Labs  Lab 09/30/23 1334 09/30/23 1758 09/30/23 2150 10/01/23 0456  PROCALCITON  --   --  0.41  --   WBC 4.7  --   --  3.9*  LATICACIDVEN 1.1 0.9  --   --     Microbiology Recent Results (from the past 240 hours)  Resp panel by RT-PCR (RSV, Flu A&B, Covid) Anterior Nasal Swab     Status: None   Collection Time: 09/30/23  1:34 PM   Specimen: Anterior Nasal Swab  Result Value Ref Range Status   SARS Coronavirus 2 by RT PCR NEGATIVE NEGATIVE Final    Comment: (NOTE) SARS-CoV-2 target nucleic acids are NOT DETECTED.  The SARS-CoV-2 RNA is generally detectable in upper respiratory  specimens during the acute phase of infection. The lowest concentration of SARS-CoV-2 viral copies this assay can detect is 138 copies/mL. A negative result does not preclude SARS-Cov-2 infection and should not be used as the sole  basis for treatment or other patient management decisions. A negative result may occur with  improper specimen collection/handling, submission of specimen other than nasopharyngeal swab, presence of viral mutation(s) within the areas targeted by this assay, and inadequate number of viral copies(<138 copies/mL). A negative result must be combined with clinical observations, patient history, and epidemiological information. The expected result is Negative.  Fact Sheet for Patients:  BloggerCourse.com  Fact Sheet for Healthcare Providers:  SeriousBroker.it  This test is no t yet approved or cleared by the [place] FDA and  has been authorized for detection and/or diagnosis of SARS-CoV-2 by FDA under an Emergency Use Authorization (EUA). This EUA will remain  in effect (meaning this test can be used) for the duration of the COVID-19 declaration under Section 564(b)(1) of the Act, 21 U.S.C.section 360bbb-3(b)(1), unless the authorization is terminated  or revoked sooner.       Influenza A by PCR NEGATIVE NEGATIVE Final   Influenza B by PCR NEGATIVE NEGATIVE Final    Comment: (NOTE) The Xpert Xpress SARS-CoV-2/FLU/RSV plus assay is intended as an aid in the diagnosis of influenza from Nasopharyngeal swab specimens and should not be used as a sole basis for treatment. Nasal washings and aspirates are unacceptable for Xpert Xpress SARS-CoV-2/FLU/RSV testing.  Fact Sheet for Patients: BloggerCourse.com  Fact Sheet for Healthcare Providers: SeriousBroker.it  This test is not yet approved or cleared by the [place] FDA and has been authorized for detection and/or diagnosis of SARS-CoV-2 by FDA under an Emergency Use Authorization (EUA). This EUA will remain in effect (meaning this test can be used) for the duration of the COVID-19 declaration under Section 564(b)(1) of the Act,  21 U.S.C. section 360bbb-3(b)(1), unless the authorization is terminated or revoked.     Resp Syncytial Virus by PCR NEGATIVE NEGATIVE Final    Comment: (NOTE) Fact Sheet for Patients: BloggerCourse.com  Fact Sheet for Healthcare Providers: SeriousBroker.it  This test is not yet approved or cleared by the [place] FDA and has been authorized for detection and/or diagnosis of SARS-CoV-2 by FDA under an Emergency Use Authorization (EUA). This EUA will remain in effect (meaning this test can be used) for the duration of the COVID-19 declaration under Section 564(b)(1) of the Act, 21 U.S.C. section 360bbb-3(b)(1), unless the authorization is terminated or revoked.  Performed at [facility], [address] [place], [place] [address]   Blood Culture (routine x 2)     Status: None (Preliminary result)   Collection Time: 09/30/23  2:24 PM   Specimen: BLOOD  Result Value Ref Range Status   Specimen Description BLOOD BLOOD RIGHT ARM  Final   Special Requests   Final    BOTTLES DRAWN AEROBIC AND ANAEROBIC Blood Culture adequate volume   Culture   Final    NO GROWTH < 24 HOURS Performed at [facility], [address] [place], [place] [address]    Report Status PENDING  Incomplete  Blood Culture (routine x 2)     Status: None (Preliminary result)   Collection Time: 09/30/23  2:24 PM   Specimen: BLOOD  Result Value Ref Range Status   Specimen Description BLOOD BLOOD RIGHT HAND  Final   Special Requests   Final    BOTTLES DRAWN AEROBIC ONLY  Blood Culture results may not be optimal due to an inadequate volume of blood received in culture bottles   Culture   Final    NO GROWTH < 24 HOURS Performed at [facility], [address] [place], [place] [address]    Report Status PENDING  Incomplete  MRSA Next Gen by PCR, Nasal     Status: None   Collection Time: 09/30/23  8:47 PM   Specimen: Nasal  Mucosa; Nasal Swab  Result Value Ref Range Status   MRSA by PCR Next Gen NOT DETECTED NOT DETECTED Final    Comment: (NOTE) The GeneXpert MRSA Assay (FDA approved for NASAL specimens only), is one component of a comprehensive MRSA colonization surveillance program. It is not intended to diagnose MRSA infection nor to guide or monitor treatment for MRSA infections. Test performance is not FDA approved in patients less than 20 years old. Performed at [facility], [address], [place], [place] [address]     Procedures and diagnostic studies:  DG Chest Portable 1 View Result Date: 09/30/2023 CLINICAL DATA:  Hypertension, weakness. EXAM: PORTABLE CHEST 1 VIEW COMPARISON:  May 11, 2023. FINDINGS: The heart size and mediastinal contours are within normal limits. No acute pulmonary disease is noted. 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 Right internal jugular Port-A-Cath is noted. IMPRESSION: No active disease. Electronically Signed   By: Lupita Raider M.D.   On: 09/30/2023 15:04               LOS: 1 day   Tajae Rybicki  [facility]   Pager on [email or web address]. If 7PM-7AM, please contact night-coverage at [email or web address]     10/01/2023, 1:23 PM

## 2023-10-01 NOTE — Progress Notes (Signed)
 Kathy Schwartz, NP notified of patients status as a stepdown patient with requirement of Levophed. Per provider, patient will remain as a stepdown patient at this time.

## 2023-10-01 NOTE — Plan of Care (Signed)
  Problem: Clinical Measurements: Goal: Ability to maintain clinical measurements within normal limits will improve Outcome: Progressing Goal: Will remain free from infection Outcome: Progressing Goal: Respiratory complications will improve Outcome: Progressing Goal: Cardiovascular complication will be avoided Outcome: Progressing   Problem: Coping: Goal: Level of anxiety will decrease Outcome: Progressing   Problem: Pain Managment: Goal: General experience of comfort will improve and/or be controlled Outcome: Progressing   Problem: Safety: Goal: Ability to remain free from injury will improve Outcome: Progressing   Problem: Skin Integrity: Goal: Risk for impaired skin integrity will decrease Outcome: Progressing

## 2023-10-01 NOTE — Plan of Care (Signed)

## 2023-10-01 NOTE — Progress Notes (Signed)
       CROSS COVER NOTE  NAME: Kathy [name] MRN: [id number] DOB : 1940/12/07 ATTENDING PHYSICIAN: Lurene Shadow, MD    Date of Service   10/01/2023   HPI/Events of Note   Notified new onset afib rvr with rate 180s via secure chat- onset aprox 1130 pm Patient admitted with symptomatic hypocalcemia with underlying multiple myeloma, chronic hypotension (on daily midodrine), anemia of chronic disease RA GERD hypothyroidism Review of labs concerning for  FTT with severe albuminemia  Interventions   Assessment/Plan: Bedside patient awake, A &O x4 denis chest pain did briefly endorse palpitations Skin pale and dry; evident protein calorie malnutrition with muscle wasting - peripheral edema    Afib with RVR - total dose 15 mg metoprolol given IV - electrolyte optimization with additional 1 gm calcium gluconate and 2 gms magnesium  patient  converted to SR/SB 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

## 2023-10-01 NOTE — Plan of Care (Signed)
   Problem: Clinical Measurements: Goal: Ability to maintain clinical measurements within normal limits will improve Outcome: Progressing Goal: Will remain free from infection Outcome: Progressing Goal: Diagnostic test results will improve Outcome: Progressing   Problem: Safety: Goal: Ability to remain free from injury will improve Outcome: Progressing

## 2023-10-02 DIAGNOSIS — I959 Hypotension, unspecified: Secondary | ICD-10-CM | POA: Diagnosis not present

## 2023-10-02 LAB — RENAL FUNCTION PANEL
Albumin: 3 g/dL — ABNORMAL LOW (ref 3.5–5.0)
Anion gap: 6 (ref 5–15)
BUN: 12 mg/dL (ref 8–23)
CO2: 22 mmol/L (ref 22–32)
Calcium: 6.7 mg/dL — ABNORMAL LOW (ref 8.9–10.3)
Chloride: 113 mmol/L — ABNORMAL HIGH (ref 98–111)
Creatinine, Ser: 0.86 mg/dL (ref 0.44–1.00)
GFR, Estimated: >60 mL/min (ref >60)
Glucose, Bld: 93 mg/dL (ref 70–99)
Phosphorus: 2.3 mg/dL — ABNORMAL LOW (ref 2.5–4.6)
Potassium: 3.3 mmol/L — ABNORMAL LOW (ref 3.5–5.1)
Sodium: 141 mmol/L (ref 135–145)

## 2023-10-02 LAB — ACTH STIMULATION, 3 TIME POINTS
Cortisol, 30 Min: 31 ug/dL
Cortisol, 60 Min: 35.7 ug/dL
Cortisol, Base: 16.3 ug/dL

## 2023-10-02 LAB — GLUCOSE, CAPILLARY
Glucose-Capillary: 81 mg/dL (ref 70–99)
Glucose-Capillary: 110 mg/dL — ABNORMAL HIGH (ref 70–99)
Glucose-Capillary: 134 mg/dL — ABNORMAL HIGH (ref 70–99)
Glucose-Capillary: 120 mg/dL — ABNORMAL HIGH (ref 70–99)
Glucose-Capillary: 89 mg/dL (ref 70–99)

## 2023-10-02 LAB — HEMOGLOBIN AND HEMATOCRIT, BLOOD
HCT: 26.1 % — ABNORMAL LOW (ref 36.0–46.0)
Hemoglobin: 9.3 g/dL — ABNORMAL LOW (ref 12.0–15.0)

## 2023-10-02 LAB — CBC
HCT: 15.2 % — ABNORMAL LOW (ref 36.0–46.0)
Hemoglobin: 5.4 g/dL — ABNORMAL LOW (ref 12.0–15.0)
MCH: 33.5 pg (ref 26.0–34.0)
MCHC: 35.5 g/dL (ref 30.0–36.0)
MCV: 94.4 fL (ref 80.0–100.0)
Platelets: 83 10*3/uL — ABNORMAL LOW (ref 150–400)
RBC: 1.61 MIL/uL — ABNORMAL LOW (ref 3.87–5.11)
RDW: 17.6 % — ABNORMAL HIGH (ref 11.5–15.5)
WBC: 3.5 10*3/uL — ABNORMAL LOW (ref 4.0–10.5)
nRBC: 0 % (ref 0.0–0.2)

## 2023-10-02 LAB — MAGNESIUM: Magnesium: 1.7 mg/dL (ref 1.7–2.4)

## 2023-10-02 LAB — PREPARE RBC (CROSSMATCH)

## 2023-10-02 MED ORDER — MAGNESIUM SULFATE 2 GM/50ML IV SOLN
2.0000 g | Freq: Once | INTRAVENOUS | Status: AC
  Administered 2023-10-02: 2 g via INTRAVENOUS
  Filled 2023-10-02: qty 50

## 2023-10-02 MED ORDER — DEXTROSE-SODIUM CHLORIDE 5-0.9 % IV SOLN: INTRAVENOUS | Status: DC

## 2023-10-02 MED ORDER — FUROSEMIDE 10 MG/ML IJ SOLN
20.0000 mg | Freq: Once | INTRAMUSCULAR | Status: AC
  Administered 2023-10-02: 20 mg via INTRAVENOUS
  Filled 2023-10-02: qty 2

## 2023-10-02 MED ORDER — POTASSIUM PHOSPHATES 15 MMOLE/5ML IV SOLN
15.0000 mmol | Freq: Once | INTRAVENOUS | Status: AC
  Administered 2023-10-02: 15 mmol via INTRAVENOUS
  Filled 2023-10-02: qty 5

## 2023-10-02 MED ORDER — POTASSIUM CHLORIDE CRYS ER 20 MEQ PO TBCR
40.0000 meq | EXTENDED_RELEASE_TABLET | Freq: Once | ORAL | Status: AC
  Administered 2023-10-02: 40 meq via ORAL
  Filled 2023-10-02: qty 2

## 2023-10-02 MED ORDER — SODIUM CHLORIDE 0.9% IV SOLUTION: Freq: Once | INTRAVENOUS | Status: DC

## 2023-10-02 NOTE — TOC Initial Note (Signed)
 Transition of Care [facility]) - Initial/Assessment Note    Patient Details  Name: Kathy [name] MRN: [id number] Date of Birth: 1940/10/30  Transition of Care [facility]) CM/SW Contact:    Liliana Cline, LCSW Phone Number: 10/02/2023, 10:19 AM  Clinical Narrative:                 CSW spoke with patient's brother/POA Jonny Ruiz. [name] confirms patient is a long term care resident at [facility] and the plan is for her to return when medically ready. CSW attempted call to Dabe at [facility], left a VM requesting a return call.  Expected Discharge Plan: Skilled Nursing Facility Barriers to Discharge: Continued Medical Work up   Patient Goals and CMS Choice Patient states their goals for this hospitalization and ongoing recovery are:: return to LTC CMS Medicare.gov Compare Post Acute Care list provided to:: Patient Represenative (must comment) Choice offered to / list presented to : Sibling, HC POA / Guardian      Expected Discharge Plan and Services       Living arrangements for the past 2 months: Skilled Nursing Facility                                      Prior Living Arrangements/Services Living arrangements for the past 2 months: Skilled Nursing Facility Lives with:: Facility Resident Patient language and need for interpreter reviewed:: Yes Do you feel safe going back to the place where you live?: Yes      Need for Family Participation in Patient Care: Yes (Comment) Care giver support system in place?: Yes (comment)   Criminal Activity/Legal Involvement Pertinent to Current Situation/Hospitalization: No - Comment as needed  Activities of Daily Living      Permission Sought/Granted Permission sought to share information with : Facility Industrial/product designer granted to share information with : Yes, Verbal Permission Granted (by brother Jonny Ruiz)     Permission granted to share info w AGENCY: Dance movement psychotherapist          Alcohol / Substance Use: Not Applicable Psych Involvement: No (comment)  Admission diagnosis:  Hypocalcemia [E83.51] Multiple myeloma not having achieved remission (HCC) [C90.00] Hypotension [I95.9] Hypotension, unspecified hypotension type [I95.9] Patient Active Problem List   Diagnosis Date Noted   Hypoalbuminemia 09/30/2023   Hyperkalemia 09/30/2023   Gram-negative bacteremia 06/17/2023   Hypocalcemia 06/16/2023   Hypernatremia 05/18/2023   Protein-calorie malnutrition, severe 05/17/2023   Sepsis secondary to UTI (HCC) 05/12/2023   Electrolyte abnormality 05/12/2023   Thrombocytopenia (HCC) 05/12/2023   AMS (altered mental status) 05/12/2023   Multiple myeloma (HCC) 04/14/2023   Hypercalcemia of malignancy 04/07/2023   General weakness 04/07/2023   Recurrent falls 04/07/2023   Hypotension 04/07/2023   Hypoalbuminemia due to protein-calorie malnutrition (HCC) 04/07/2023   Acute prostatitis 04/07/2023   Chronic kidney disease, stage 3b (HCC) 04/07/2023   Dysphagia 04/07/2023   Acute pancreatitis 04/07/2023   History of thyroid cancer 04/07/2023   Anemia 03/30/2023   Physical deconditioning 03/30/2023   Elevated serum immunoglobulin free light chain level 03/30/2023   Leg swelling 02/21/2023   Hospital discharge follow-up 02/21/2023   Hypomagnesemia 02/21/2023   Urge incontinence of urine 02/21/2023   Hypercalcemia 01/23/2023   AKI (acute kidney injury) (HCC) 01/23/2023   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   Hammer toe of left foot 09/15/2022  Left foot pain 09/15/2022   Iron deficiency anemia due to chronic blood loss 07/19/2022   Rectal bleeding 01/21/2022   Gastrojejunal ulcer with hemorrhage    Encounter for general adult medical examination with abnormal findings 09/16/2021   Hypokalemia 09/16/2021   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   Bilateral impacted cerumen 11/19/2020   Hearing loss  11/19/2020   Malignant tumor of thyroid gland (HCC) 10/08/2020   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   Polyarthritis    Bariatric surgery status    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/2017   Rheumatoid arthritis (HCC) 11/16/2016   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   Rotator cuff arthropathy, 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   S/P shoulder replacement 05/11/2016   Allergic rhinitis 10/19/2013   GERD (gastroesophageal reflux disease) 10/19/2013   Osteoporosis    Essential hypertension    Acquired hypothyroidism    Hyperlipidemia    Anxiety    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 [place] [address] Phone: [phone] Fax: [phone]  [facility] - [place], [place] - [address] [place] [address] Phone: [phone] Fax: [phone]  [facility] - [place], [place] - [address] [phone] [facility] [address] [place] [address] Phone: [phone] Fax: [phone]     Social Drivers of Health (SDOH) Social History: SDOH Screenings   Food Insecurity: No Food Insecurity (09/30/2023)  Housing: Low Risk  (09/30/2023)  Transportation Needs: No Transportation Needs (09/30/2023)  Utilities: Not At Risk (09/30/2023)  Alcohol Screen: Low Risk  (09/14/2021)  Depression (PHQ2-9): Low Risk  (03/30/2023)  Financial Resource Strain: Low Risk  (09/14/2021)  Physical Activity: Sufficiently Active (09/14/2021)  Social Connections: Moderately Integrated (09/30/2023)  Stress: No Stress Concern Present (09/14/2021)  Tobacco Use: Low Risk  (09/30/2023)   SDOH Interventions:     Readmission Risk Interventions    10/02/2023   10:16 AM  06/17/2023   11:10 AM 05/12/2023    1:07 PM  Readmission Risk Prevention Plan  Transportation Screening Complete Complete Complete  Medication Review Oceanographer) Complete Complete   PCP or Specialist appointment within 3-5 days of discharge Complete  Complete  HRI or Home Care Consult Complete Complete Complete  SW Recovery Care/Counseling Consult Complete Complete Complete  Palliative Care Screening Complete Not Applicable Not Applicable  Skilled Nursing Facility Complete Complete Complete

## 2023-10-02 NOTE — Progress Notes (Addendum)
 Progress Note    Kathy [name]  LKG:[id number] DOB: 02/15/1941  DOA: 09/30/2023 PCP: [name], MD      Brief Narrative:    Medical records reviewed and are as summarized below:  Kathy [name] is a 83 y.o. female  with multiple medical problems including multiple myeloma, chronic hypotension on midodrine, hypocalcemia, anemia of chronic disease, suspected rheumatoid arthritis, history of E. coli UTI and bacteremia in November 2024, hypothyroidism, GERD, chronic peripheral edema, OSA, hypertension, hyperlipidemia.  She had an outpatient blood work done when she was found to be hypocalcemic and was advised to go to the ED for further management.  Reportedly, her blood pressure at the nursing home was low.  She has chronic low blood pressure and she is on midodrine.  Apparently, she was given midodrine but there was no improvement in her blood pressure.  She complained of general weakness.  She was subsequently referred to the ED for further management. She reported some dysuria and increased frequency but she said this is nothing new.  No cough, shortness of breath, chest pain, dizziness, leg, vomiting, diarrhea, abdominal pain, fever, chills   ED Course: BP was 74/51 on arrival in the ED but she was afebrile was not tachypneic or tachycardic.  Oxygen saturation was 100% on room air on arrival.  It later dropped to 87% on room air but this is suspected to be spurious. The patient was given 2 L of normal saline bolus.  She was also given IV calcium gluconate bolus and IV calcium chloride infusion.     Assessment/Plan:   Principal Problem:   Hypotension Active Problems:   Multiple myeloma (HCC)   Hypocalcemia   Hypoalbuminemia   Hyperkalemia   Body mass index is 29.29 kg/m.   Hypotension: BP is better. Discontinue IV fluids Continue midodrine. S/p treatment with IV Levophed infusion and IV albumin infusion. No growth on blood cultures thus far. No evidence  of infection at this time.  Discontinue IV ceftriaxone (received 2 doses). No growth on blood cultures.  Procalcitonin level was normal.  MRSA screen negative. Urinalysis not convincing for UTI. Initial cortisol level 15.9 on 10/01/2023 ACTH stimulation test on 10/02/2023 showed basal cortisol 16.3, cortisol 30 minutes 31 and cortisol 16 minutes 35.7.  This rules out adrenal insufficiency.      Hypocalcemia: Calcium level is stable.  Continue oral calcium carbonate S/p treatment with IV calcium gluconate and IV calcium chloride Measured calcium is 6.7 and albumin is 3 today.  Corrected calcium is 7.5.  Chart review shows that patient was given IV calcium gluconate for calcium level of 5.6 on 09/22/2023.     Mild hyperkalemia: Improved  Hypokalemia and hypophosphatemia: Potassium 3.3, phosphorus 2.3.  Replete with IV potassium phosphate Magnesium is low normal at 1.7.  Replete with IV magnesium sulfate.   IgG kappa multiple myeloma: She is on Darzalex monthly, lenalidomide.  She had Darzalex on 09/22/2023.   She is also on low-dose aspirin and acyclovir. She follows with Dr. Dr. Ellin Saba, oncologist.     Severe hypoalbuminemia: Albumin level less than 1.5 on admission.  Albumin improved to 3.0.   S/p IV albumin infusion.   Significant bilateral lower extremity peripheral edema: This is probably from severe hypoalbuminemia.     Acute on chronic anemia, anemia of chronic disease: Hemoglobin down from 8.3-7.7- 5.4.  Worsening anemia likely from multiple myeloma and chemotherapy. Transfuse 2 units of packed red blood cells.  Discussed risk, benefits and  alternatives of blood transfusion.  Patient is agreeable to blood transfusion. Hemoglobin was 9.4 on 09/22/2023.   Monitor H&H.              Diet Order             Diet regular Room service appropriate? Yes; Fluid consistency: Thin  Diet effective now                             Consultants: None  Procedures: None    Medications:    sodium chloride   Intravenous Once   acyclovir  400 mg Oral BID   aspirin EC  81 mg Oral Daily   calcium carbonate  1 tablet Oral TID WC   Chlorhexidine Gluconate Cloth  6 each Topical Daily   enoxaparin (LOVENOX) injection  40 mg Subcutaneous Q24H   furosemide  20 mg Intravenous Once   levothyroxine  68.5 mcg Oral Q0600   midodrine  10 mg Oral TID with meals   multivitamin with minerals  1 tablet Oral q morning   oxybutynin  10 mg Oral QHS   pantoprazole  40 mg Oral BH-q7a   potassium chloride  40 mEq Oral Once   Ensure Max Protein  11 oz Oral Daily   sodium chloride flush  10-40 mL Intracatheter Q12H   Continuous Infusions:  cefTRIAXone (ROCEPHIN)  IV Stopped (10/01/23 1742)   potassium PHOSPHATE IVPB (in mmol) 43 mL/hr at 10/02/23 0842     Anti-infectives (From admission, onward)    Start     Dose/Rate Route Frequency Ordered Stop   09/30/23 2200  acyclovir (ZOVIRAX) tablet 400 mg  Status:  Discontinued        400 mg Oral 2 times daily 09/30/23 1752 09/30/23 1834   09/30/23 2200  acyclovir (ZOVIRAX) 200 MG capsule 400 mg        400 mg Oral 2 times daily 09/30/23 1834     09/30/23 1800  cefTRIAXone (ROCEPHIN) 1 g in sodium chloride 0.9 % 100 mL IVPB  Status:  Discontinued        1 g 200 mL/hr over 30 Minutes Intravenous Every 24 hours 09/30/23 1747 09/30/23 1749   09/30/23 1800  cefTRIAXone (ROCEPHIN) 1 g in sodium chloride 0.9 % 100 mL IVPB        1 g 200 mL/hr over 30 Minutes Intravenous Every 24 hours 09/30/23 1749                Family Communication/Anticipated D/C date and plan/Code Status   DVT prophylaxis: enoxaparin (LOVENOX) injection 40 mg Start: 09/30/23 2200     Code Status: Limited: Do not attempt resuscitation (DNR) -DNR-LIMITED -Do Not Intubate/DNI   Family Communication: Plan discussed with Mr. Jonny Ruiz Katzenstein, brother, over the phone Disposition Plan: Plan to  discharge to [facility] nursing home   Status is: Inpatient Remains inpatient appropriate because: Severe anemia       Subjective:   Interval events noted.  She complains of feeling very weak.  No vomiting, no bloody stools.  She said she is not bleeding from anywhere.  No shortness of breath or chest pain.  Objective:    Vitals:   10/02/23 1000 10/02/23 1100 10/02/23 1137 10/02/23 1153  BP: 102/66 105/68 103/60 126/83  Pulse:      Resp: 19 19 (!) 22   Temp:  98.6 F (37 C)  98.5 F (36.9 C)  TempSrc:  Oral  Oral  SpO2:  99%  98%  Weight:      Height:       No data found.   Intake/Output Summary (Last 24 hours) at 10/02/2023 1213 Last data filed at 10/02/2023 0842 Gross per 24 hour  Intake 2940.64 ml  Output 950 ml  Net 1990.64 ml   Filed Weights   09/30/23 1323  Weight: 70.3 kg    Exam:  GEN: NAD SKIN: Warm and dry EYES: Pale, anicteric ENT: MMM CV: RRR PULM: CTA B ABD: soft, obese, NT, +BS CNS: AAO x 3, non focal EXT: Significant bilateral lower extremity edema, no erythema or tenderness         Data Reviewed:   I have personally reviewed following labs and imaging studies:  Labs: Labs show the following:   Basic Metabolic Panel: Recent Labs  Lab 09/30/23 1334 10/01/23 0115 10/01/23 0456 10/02/23 0537  NA 138 139 139 141  K 5.4* 4.6 4.8 3.3*  CL 108 109 109 113*  CO2 22 22 20* 22  GLUCOSE 120* 119* 100* 93  BUN 18 17 16 12   CREATININE 1.09* 1.10* 1.04* 0.86  CALCIUM 6.0* 6.7* 6.8* 6.7*  MG 1.7  --   --  1.7  PHOS 2.6  --   --  2.3*   GFR Estimated Creatinine Clearance: 46 mL/min (by C-G formula based on SCr of 0.86 mg/dL). Liver Function Tests: Recent Labs  Lab 09/30/23 1334 10/01/23 0115 10/02/23 0537  AST 23 22  --   ALT 19 16  --   ALKPHOS 185* 149*  --   BILITOT 1.2 1.2  --   PROT 4.4* 4.6*  --   ALBUMIN <1.5* 2.1* 3.0*   No results for input(s): "LIPASE", "AMYLASE" in the last 168 hours. No results for  input(s): "AMMONIA" in the last 168 hours. Coagulation profile No results for input(s): "INR", "PROTIME" in the last 168 hours.  CBC: Recent Labs  Lab 09/30/23 1334 10/01/23 0456 10/02/23 0537  WBC 4.7 3.9* 3.5*  NEUTROABS 3.0  --   --   HGB 8.3* 7.7* 5.4*  HCT 23.1* 21.3* 15.2*  MCV 93.9 94.2 94.4  PLT 151 107* 83*   Cardiac Enzymes: No results for input(s): "CKTOTAL", "CKMB", "CKMBINDEX", "TROPONINI" in the last 168 hours. BNP (last 3 results) No results for input(s): "PROBNP" in the last 8760 hours. CBG: Recent Labs  Lab 10/01/23 1946 10/01/23 2358 10/02/23 0544 10/02/23 0756 10/02/23 1150  GLUCAP 107* 94 81 110* 134*   D-Dimer: No results for input(s): "DDIMER" in the last 72 hours. Hgb A1c: No results for input(s): "HGBA1C" in the last 72 hours. Lipid Profile: No results for input(s): "CHOL", "HDL", "LDLCALC", "TRIG", "CHOLHDL", "LDLDIRECT" in the last 72 hours. Thyroid function studies: Recent Labs    10/01/23 0456  TSH 0.087*   Anemia work up: No results for input(s): "VITAMINB12", "FOLATE", "FERRITIN", "TIBC", "IRON", "RETICCTPCT" in the last 72 hours. Sepsis Labs: Recent Labs  Lab 09/30/23 1334 09/30/23 1758 09/30/23 2150 10/01/23 0456 10/02/23 0537  PROCALCITON  --   --  0.41  --   --   WBC 4.7  --   --  3.9* 3.5*  LATICACIDVEN 1.1 0.9  --   --   --     Microbiology Recent Results (from the past 240 hours)  Resp panel by RT-PCR (RSV, Flu A&B, Covid) Anterior Nasal Swab     Status: None   Collection Time: 09/30/23  1:34 PM   Specimen: Anterior Nasal  Swab  Result Value Ref Range Status   SARS Coronavirus 2 by RT PCR NEGATIVE NEGATIVE Final    Comment: (NOTE) SARS-CoV-2 target nucleic acids are NOT DETECTED.  The SARS-CoV-2 RNA is generally detectable in upper respiratory specimens during the acute phase of infection. The lowest concentration of SARS-CoV-2 viral copies this assay can detect is 138 copies/mL. A negative result does not  preclude SARS-Cov-2 infection and should not be used as the sole basis for treatment or other patient management decisions. A negative result may occur with  improper specimen collection/handling, submission of specimen other than nasopharyngeal swab, presence of viral mutation(s) within the areas targeted by this assay, and inadequate number of viral copies(<138 copies/mL). A negative result must be combined with clinical observations, patient history, and epidemiological information. The expected result is Negative.  Fact Sheet for Patients:  BloggerCourse.com  Fact Sheet for Healthcare Providers:  SeriousBroker.it  This test is no t yet approved or cleared by the [place] FDA and  has been authorized for detection and/or diagnosis of SARS-CoV-2 by FDA under an Emergency Use Authorization (EUA). This EUA will remain  in effect (meaning this test can be used) for the duration of the COVID-19 declaration under Section 564(b)(1) of the Act, 21 U.S.C.section 360bbb-3(b)(1), unless the authorization is terminated  or revoked sooner.       Influenza A by PCR NEGATIVE NEGATIVE Final   Influenza B by PCR NEGATIVE NEGATIVE Final    Comment: (NOTE) The Xpert Xpress SARS-CoV-2/FLU/RSV plus assay is intended as an aid in the diagnosis of influenza from Nasopharyngeal swab specimens and should not be used as a sole basis for treatment. Nasal washings and aspirates are unacceptable for Xpert Xpress SARS-CoV-2/FLU/RSV testing.  Fact Sheet for Patients: BloggerCourse.com  Fact Sheet for Healthcare Providers: SeriousBroker.it  This test is not yet approved or cleared by the [place] FDA and has been authorized for detection and/or diagnosis of SARS-CoV-2 by FDA under an Emergency Use Authorization (EUA). This EUA will remain in effect (meaning this test can be used) for the  duration of the COVID-19 declaration under Section 564(b)(1) of the Act, 21 U.S.C. section 360bbb-3(b)(1), unless the authorization is terminated or revoked.     Resp Syncytial Virus by PCR NEGATIVE NEGATIVE Final    Comment: (NOTE) Fact Sheet for Patients: BloggerCourse.com  Fact Sheet for Healthcare Providers: SeriousBroker.it  This test is not yet approved or cleared by the [place] FDA and has been authorized for detection and/or diagnosis of SARS-CoV-2 by FDA under an Emergency Use Authorization (EUA). This EUA will remain in effect (meaning this test can be used) for the duration of the COVID-19 declaration under Section 564(b)(1) of the Act, 21 U.S.C. section 360bbb-3(b)(1), unless the authorization is terminated or revoked.  Performed at [facility], [address] [place], [place] [address]   Blood Culture (routine x 2)     Status: None (Preliminary result)   Collection Time: 09/30/23  2:24 PM   Specimen: BLOOD  Result Value Ref Range Status   Specimen Description BLOOD BLOOD RIGHT ARM  Final   Special Requests   Final    BOTTLES DRAWN AEROBIC AND ANAEROBIC Blood Culture adequate volume   Culture   Final    NO GROWTH 2 DAYS Performed at [facility], [address] [place], [place] [address]    Report Status PENDING  Incomplete  Blood Culture (routine x 2)     Status: None (Preliminary result)   Collection  Time: 09/30/23  2:24 PM   Specimen: BLOOD  Result Value Ref Range Status   Specimen Description BLOOD BLOOD RIGHT HAND  Final   Special Requests   Final    BOTTLES DRAWN AEROBIC ONLY Blood Culture results may not be optimal due to an inadequate volume of blood received in culture bottles   Culture   Final    NO GROWTH 2 DAYS Performed at [facility], [address], [place], [place] [address]    Report Status PENDING  Incomplete  MRSA Next Gen by PCR, Nasal     Status:  None   Collection Time: 09/30/23  8:47 PM   Specimen: Nasal Mucosa; Nasal Swab  Result Value Ref Range Status   MRSA by PCR Next Gen NOT DETECTED NOT DETECTED Final    Comment: (NOTE) The GeneXpert MRSA Assay (FDA approved for NASAL specimens only), is one component of a comprehensive MRSA colonization surveillance program. It is not intended to diagnose MRSA infection nor to guide or monitor treatment for MRSA infections. Test performance is not FDA approved in patients less than 57 years old. Performed at [facility], [address], [place], [place] [address]     Procedures and diagnostic studies:  DG Chest Portable 1 View Result Date: 09/30/2023 CLINICAL DATA:  Hypertension, weakness. EXAM: PORTABLE CHEST 1 VIEW COMPARISON:  May 11, 2023. FINDINGS: The heart size and mediastinal contours are within normal limits. No acute pulmonary disease is noted. 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 Right internal jugular Port-A-Cath is noted. IMPRESSION: No active disease. Electronically Signed   By: Lupita Raider M.D.   On: 09/30/2023 15:04               LOS: 2 days   Naomy Esham  [facility]   Pager on [email or web address]. If 7PM-7AM, please contact night-coverage at [email or web address]     10/02/2023, 12:13 PM

## 2023-10-02 NOTE — Progress Notes (Signed)
 PHARMACY CONSULT NOTE - FOLLOW UP  Pharmacy Consult for Electrolyte Monitoring and Replacement   Recent Labs: Potassium (mmol/L)  Date Value  10/02/2023 3.3 (L)   Magnesium (mg/dL)  Date Value  78/29/5621 1.7   Calcium (mg/dL)  Date Value  30/86/5784 6.7 (L)   Albumin (g/dL)  Date Value  69/62/9528 3.0 (L)  03/18/2021 3.2 (L)   Phosphorus (mg/dL)  Date Value  41/32/4401 2.3 (L)   Sodium (mmol/L)  Date Value  10/02/2023 141  02/23/2023 135     Assessment: 83 y.o. female with multiple medical problems including multiple myeloma, chronic hypotension on midodrine, hypocalcemia, anemia of chronic disease, suspected rheumatoid arthritis, history of E. coli UTI and bacteremia in November 2024, hypothyroidism, GERD, chronic peripheral edema, OSA, hypertension, hyperlipidemia   Goal of Therapy:  Electrolytes WNL  Plan:  ---2 grams IV magnesium sulfate x 1 ---15 mmol IV potassium phosphate x 1 ---recheck electrolytes in am  Lowella Bandy ,PharmD Clinical Pharmacist 10/02/2023 7:29 AM

## 2023-10-03 DIAGNOSIS — I959 Hypotension, unspecified: Secondary | ICD-10-CM | POA: Diagnosis not present

## 2023-10-03 LAB — RENAL FUNCTION PANEL
Albumin: 2.7 g/dL — ABNORMAL LOW (ref 3.5–5.0)
Anion gap: 9 (ref 5–15)
BUN: 13 mg/dL (ref 8–23)
CO2: 21 mmol/L — ABNORMAL LOW (ref 22–32)
Calcium: 6.5 mg/dL — ABNORMAL LOW (ref 8.9–10.3)
Chloride: 111 mmol/L (ref 98–111)
Creatinine, Ser: 0.73 mg/dL (ref 0.44–1.00)
GFR, Estimated: >60 mL/min (ref >60)
Glucose, Bld: 75 mg/dL (ref 70–99)
Phosphorus: 2.5 mg/dL (ref 2.5–4.6)
Potassium: 3.7 mmol/L (ref 3.5–5.1)
Sodium: 141 mmol/L (ref 135–145)

## 2023-10-03 LAB — BLOOD GAS, VENOUS
Bicarbonate: 24.2 mmol/L (ref 20.0–28.0)
O2 Saturation: 33.1 mmol/L (ref 0.0–2.0)
Patient temperature: 37
Patient temperature: 37 %
pCO2, Ven: 40 mmHg — ABNORMAL LOW (ref 44–60)
pH, Ven: 7.39 (ref 7.25–7.43)
pO2, Ven: 24.2 mmol/L (ref 32–45)

## 2023-10-03 LAB — CBC
HCT: 26.5 % — ABNORMAL LOW (ref 36.0–46.0)
Hemoglobin: 9.5 g/dL — ABNORMAL LOW (ref 12.0–15.0)
MCH: 32 pg (ref 26.0–34.0)
MCHC: 35.8 g/dL (ref 30.0–36.0)
MCV: 89.2 fL (ref 80.0–100.0)
Platelets: 75 10*3/uL — ABNORMAL LOW (ref 150–400)
RBC: 2.97 MIL/uL — ABNORMAL LOW (ref 3.87–5.11)
RDW: 17.3 % — ABNORMAL HIGH (ref 11.5–15.5)
WBC: 4.7 10*3/uL (ref 4.0–10.5)
nRBC: 0 % (ref 0.0–0.2)

## 2023-10-03 LAB — GLUCOSE, CAPILLARY
Glucose-Capillary: 100 mg/dL — ABNORMAL HIGH (ref 70–99)
Glucose-Capillary: 72 mg/dL (ref 70–99)
Glucose-Capillary: 75 mg/dL (ref 70–99)
Glucose-Capillary: 76 mg/dL (ref 70–99)
Glucose-Capillary: 78 mg/dL (ref 70–99)

## 2023-10-03 LAB — MAGNESIUM: Magnesium: 1.9 mg/dL (ref 1.7–2.4)

## 2023-10-03 MED ORDER — TRAMADOL HCL 50 MG PO TABS: 50.0000 mg | ORAL_TABLET | Freq: Three times a day (TID) | ORAL | 0 refills | Status: DC | PRN

## 2023-10-03 MED ORDER — HEPARIN SOD (PORK) LOCK FLUSH 100 UNIT/ML IV SOLN
500.0000 [IU] | Freq: Once | INTRAVENOUS | Status: AC
  Administered 2023-10-03: 500 [IU] via INTRAVENOUS
  Filled 2023-10-03: qty 5

## 2023-10-03 NOTE — Progress Notes (Signed)
 This RN provided report to receiving nurse, Era, at [facility]. All outstanding questions resolved. PIV removed. Cannula intact. Pt tolerated well. Charge Nurse Amy [employer]. All belongings packed and in tow.

## 2023-10-03 NOTE — TOC Transition Note (Addendum)
 Transition of Care [facility]) - Discharge Note   Patient Details  Name: Kathy [name] MRN: [id number] Date of Birth: [birth year]  Transition of Care [facility]) CM/SW Contact:  Truddie Hidden, RN Phone Number: 10/03/2023, 12:29 PM   Clinical Narrative:    12:09pm Attempt to reach Dabe in admissions at [facility]. No answer. Left a message.   Attempt to reach Dabe in admissions at [facility], no answer. FL2, discharge summary and SNF transfer report sent via HUB.   12:38pm Spoke with Dabe in admissions at [facility] Per facility patient admission confirmed for today. Patient assigned room # 210 Nurse will call report to [phone] Face sheet and medical necessity forms printed to the floor to be added to the EMS pack EMS arranged "Next one up when truck is available."  Discharge summary and SNF transfer report sent in HUB.  Nurse, and patient notified of details of discharge. TOC signing off.       Barriers to Discharge: Continued Medical Work up   Patient Goals and CMS Choice Patient states their goals for this hospitalization and ongoing recovery are:: return to LTC CMS Medicare.gov Compare Post Acute Care list provided to:: Patient Represenative (must comment) Choice offered to / list presented to : Sibling, [facility] POA / Guardian      Discharge Placement                       Discharge Plan and Services Additional resources added to the After Visit Summary for                                       Social Drivers of Health (SDOH) Interventions SDOH Screenings   Food Insecurity: No Food Insecurity (09/30/2023)  Housing: Low Risk  (09/30/2023)  Transportation Needs: No Transportation Needs (09/30/2023)  Utilities: Not At Risk (09/30/2023)  Alcohol Screen: Low Risk  (09/14/2021)  Depression (PHQ2-9): Low Risk  (03/30/2023)  Financial Resource Strain: Low Risk  (09/14/2021)  Physical Activity: Sufficiently Active (09/14/2021)  Social Connections:  Moderately Integrated (09/30/2023)  Stress: No Stress Concern Present (09/14/2021)  Tobacco Use: Low Risk  (09/30/2023)     Readmission Risk Interventions    10/02/2023   10:16 AM 06/17/2023   11:10 AM 05/12/2023    1:07 PM  Readmission Risk Prevention Plan  Transportation Screening Complete Complete Complete  Medication Review Oceanographer) Complete Complete   PCP or Specialist appointment within 3-5 days of discharge Complete  Complete  HRI or Home Care Consult Complete Complete Complete  SW Recovery Care/Counseling Consult Complete Complete Complete  Palliative Care Screening Complete Not Applicable Not Applicable  Skilled Nursing Facility Complete Complete Complete

## 2023-10-03 NOTE — Discharge Summary (Signed)
 Physician Discharge Summary   Patient: Kathy [name] MRN: [id number] DOB: [birth year]  Admit date:     09/30/2023  Discharge date: 10/03/23  Discharge Physician: Lurene Shadow   PCP: [name], MD   Recommendations at discharge:   Follow-up with Dr. Ellin Saba, your oncologist, as scheduled Follow-up with physician at the nursing home within 3 days of discharge  Discharge Diagnoses: Principal Problem:   Hypotension Active Problems:   Multiple myeloma (HCC)   Hypocalcemia   Hypoalbuminemia   Hyperkalemia  Resolved Problems:   * No resolved hospital problems. [facility] Course:  Kathy [name] is a 83 y.o. female  with multiple medical problems including multiple myeloma, chronic hypotension on midodrine, hypocalcemia, anemia of chronic disease, suspected rheumatoid arthritis, history of E. coli UTI and bacteremia in November 2024, hypothyroidism, GERD, chronic peripheral edema, OSA, hypertension, hyperlipidemia.  She had an outpatient blood work done when she was found to be hypocalcemic and was advised to go to the ED for further management.  Reportedly, her blood pressure at the nursing home was low.  She has chronic low blood pressure and she is on midodrine.  Apparently, she was given midodrine but there was no improvement in her blood pressure.  She complained of general weakness.  She was subsequently referred to the ED for further management. She reported some dysuria and increased frequency but she said this is nothing new.  No cough, shortness of breath, chest pain, dizziness, leg, vomiting, diarrhea, abdominal pain, fever, chills     ED Course: BP was 74/51 on arrival in the ED but she was afebrile was not tachypneic or tachycardic.  Oxygen saturation was 100% on room air on arrival.  It later dropped to 87% on room air but this is suspected to be spurious. The patient was given 2 L of normal saline bolus.  She was also given IV calcium gluconate bolus and IV  calcium chloride infusion.    Assessment and Plan:  Hypotension: BP has improved. Continue midodrine. S/p treatment with IV fluids, IV Levophed infusion and IV albumin infusion. No growth on blood cultures thus far. No evidence of infection at this time.  S/p treatment with IV ceftriaxone (received 3 doses). No growth on blood cultures.  Procalcitonin level was normal.  MRSA screen negative. Urinalysis not convincing for UTI. Initial cortisol level 15.9 on 10/01/2023 ACTH stimulation test on 10/02/2023 showed basal cortisol 16.3, cortisol 30 minutes 31 and cortisol 16 minutes 35.7.  This rules out adrenal insufficiency.     Hypocalcemia: Calcium level is stable.  Continue oral calcium carbonate S/p treatment with IV calcium gluconate and IV calcium chloride Calcium level has been fluctuating.  Patient calcium is 6.5, albumin 2.7 and corrected calcium is 7.5 Continue calcium carbonate Chart review shows that patient was given IV calcium gluconate for calcium level of 5.6 on 09/22/2023.     Mild hyperkalemia: Improved  Hypomagnesemia hypokalemia and hypophosphatemia: Improved     IgG kappa multiple myeloma: She is on Darzalex monthly, lenalidomide.  She had Darzalex on 09/22/2023.   She is also on low-dose aspirin and acyclovir. She follows with Dr. Dr. Ellin Saba, oncologist.     Severe hypoalbuminemia: Albumin level less than 1.5 on admission.  Albumin improved to 2.7 S/p IV albumin infusion.   Significant bilateral lower extremity peripheral edema: This is probably from severe hypoalbuminemia.     Acute on chronic anemia, anemia of chronic disease: Hemoglobin up from 5.4-9.5.  S/p transfusion with 2 units  of PRBCs.   Worsening anemia was likely from multiple myeloma and chemotherapy. Hemoglobin was 9.4 on 09/22/2023.   Monitor H&H as an outpatient     Her condition has improved and she is deemed stable for discharge to SNF today.           Consultants: None Procedures  performed: None Disposition: Skilled nursing facility Diet recommendation:  Regular diet DISCHARGE MEDICATION: Allergies as of 10/03/2023       Reactions   Macrobid [nitrofurantoin] Other (See Comments)   Stomach pain        Medication List     STOP taking these medications    ondansetron 4 MG disintegrating tablet Commonly known as: ZOFRAN-ODT   ondansetron 4 MG tablet Commonly known as: ZOFRAN       TAKE these medications    acetaminophen 500 MG tablet Commonly known as: TYLENOL Take 1 tablet (500 mg total) by mouth every 6 (six) hours as needed for mild pain, headache or fever (or Fever >/= 101).   acyclovir 400 MG tablet Commonly known as: ZOVIRAX Take 1 tablet (400 mg total) by mouth 2 (two) times daily.   aspirin EC 81 MG tablet Take 81 mg by mouth daily.   calcium carbonate 500 MG chewable tablet Commonly known as: TUMS - dosed in mg elemental calcium Chew 1 tablet (200 mg of elemental calcium total) by mouth 3 (three) times daily with meals.   ferrous sulfate 325 (65 FE) MG EC tablet Take 1 tablet (325 mg total) by mouth daily.   Fish Oil 1000 MG Caps Take 1 capsule by mouth daily.   furosemide 20 MG tablet Commonly known as: LASIX Take 20 mg by mouth daily.   Humira (2 Pen) 40 MG/0.4ML pen Generic drug: adalimumab Inject 40 mg into the skin every Saturday.   lenalidomide 10 MG capsule Commonly known as: REVLIMID Take 1 capsule (10 mg total) by mouth daily. Take 10 mg by mouth for 21 days. Take 21 days on, 7 days off every 28 days   levothyroxine 137 MCG tablet Commonly known as: SYNTHROID Take 0.5 tablets (68.5 mcg total) by mouth daily at 6 (six) AM. What changed: how much to take   magnesium oxide 400 (240 Mg) MG tablet Commonly known as: MAG-OX Take 1 tablet (400 mg total) by mouth daily.   midodrine 2.5 MG tablet Commonly known as: PROAMATINE Take 3 tablets (7.5 mg total) by mouth 3 (three) times daily with meals.   multivitamin  with minerals Tabs tablet Take 1 tablet by mouth every morning.   Narcan 4 MG/0.1ML Liqd nasal spray kit Generic drug: naloxone Place 1 spray into the nose as needed (opioid reversal).   omeprazole 40 MG capsule Commonly known as: PRILOSEC Take 1 capsule (40 mg total) by mouth daily before breakfast. Open capsule and swallow granules with liquid or applesauce   oxybutynin 10 MG 24 hr tablet Commonly known as: DITROPAN-XL Take 10 mg by mouth at bedtime.   polyethylene glycol 17 g packet Commonly known as: MIRALAX / GLYCOLAX Take 17 g by mouth daily.   Prolia 60 MG/ML Sosy injection Generic drug: denosumab 60mg  Subcutaneous every 6 months 180 days   traMADol 50 MG tablet Commonly known as: ULTRAM Take 1 tablet (50 mg total) by mouth every 8 (eight) hours as needed for moderate pain (pain score 4-6).        Discharge Exam: Filed Weights   09/30/23 1323  Weight: 70.3 kg   GEN: NAD SKIN:  Warm and dry EYES: No pallor or icterus ENT: MMM CV: RRR PULM: CTA B ABD: soft, obese, NT, +BS CNS: AAO x 3, non focal EXT: Bilateral lower extremity edema, no erythema or tenderness.  Chronic swelling of the dorsal aspect of the left hand from rheumatoid arthritis.   Condition at discharge: good  The results of significant diagnostics from this hospitalization (including imaging, microbiology, ancillary and laboratory) are listed below for reference.   Imaging Studies: DG Chest Portable 1 View Result Date: 09/30/2023 CLINICAL DATA:  Hypertension, weakness. EXAM: PORTABLE CHEST 1 VIEW COMPARISON:  May 11, 2023. FINDINGS: The heart size and mediastinal contours are within normal limits. No acute pulmonary disease is noted. 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 Right internal jugular Port-A-Cath is noted. IMPRESSION: No active disease. Electronically Signed   By: Lupita Raider M.D.   On: 09/30/2023 15:04    Microbiology: Results for orders placed or performed during the  hospital encounter of 09/30/23  Resp panel by RT-PCR (RSV, Flu A&B, Covid) Anterior Nasal Swab     Status: None   Collection Time: 09/30/23  1:34 PM   Specimen: Anterior Nasal Swab  Result Value Ref Range Status   SARS Coronavirus 2 by RT PCR NEGATIVE NEGATIVE Final    Comment: (NOTE) SARS-CoV-2 target nucleic acids are NOT DETECTED.  The SARS-CoV-2 RNA is generally detectable in upper respiratory specimens during the acute phase of infection. The lowest concentration of SARS-CoV-2 viral copies this assay can detect is 138 copies/mL. A negative result does not preclude SARS-Cov-2 infection and should not be used as the sole basis for treatment or other patient management decisions. A negative result may occur with  improper specimen collection/handling, submission of specimen other than nasopharyngeal swab, presence of viral mutation(s) within the areas targeted by this assay, and inadequate number of viral copies(<138 copies/mL). A negative result must be combined with clinical observations, patient history, and epidemiological information. The expected result is Negative.  Fact Sheet for Patients:  BloggerCourse.com  Fact Sheet for Healthcare Providers:  SeriousBroker.it  This test is no t yet approved or cleared by the [place] FDA and  has been authorized for detection and/or diagnosis of SARS-CoV-2 by FDA under an Emergency Use Authorization (EUA). This EUA will remain  in effect (meaning this test can be used) for the duration of the COVID-19 declaration under Section 564(b)(1) of the Act, 21 U.S.C.section 360bbb-3(b)(1), unless the authorization is terminated  or revoked sooner.       Influenza A by PCR NEGATIVE NEGATIVE Final   Influenza B by PCR NEGATIVE NEGATIVE Final    Comment: (NOTE) The Xpert Xpress SARS-CoV-2/FLU/RSV plus assay is intended as an aid in the diagnosis of influenza from Nasopharyngeal swab  specimens and should not be used as a sole basis for treatment. Nasal washings and aspirates are unacceptable for Xpert Xpress SARS-CoV-2/FLU/RSV testing.  Fact Sheet for Patients: BloggerCourse.com  Fact Sheet for Healthcare Providers: SeriousBroker.it  This test is not yet approved or cleared by the [place] FDA and has been authorized for detection and/or diagnosis of SARS-CoV-2 by FDA under an Emergency Use Authorization (EUA). This EUA will remain in effect (meaning this test can be used) for the duration of the COVID-19 declaration under Section 564(b)(1) of the Act, 21 U.S.C. section 360bbb-3(b)(1), unless the authorization is terminated or revoked.     Resp Syncytial Virus by PCR NEGATIVE NEGATIVE Final    Comment: (NOTE) Fact Sheet for Patients: BloggerCourse.com  Fact Sheet for  Healthcare Providers: SeriousBroker.it  This test is not yet approved or cleared by the [place] and has been authorized for detection and/or diagnosis of SARS-CoV-2 by FDA under an Emergency Use Authorization (EUA). This EUA will remain in effect (meaning this test can be used) for the duration of the COVID-19 declaration under Section 564(b)(1) of the Act, 21 U.S.C. section 360bbb-3(b)(1), unless the authorization is terminated or revoked.  Performed at [facility], [address] [place], [place] [address]   Blood Culture (routine x 2)     Status: None (Preliminary result)   Collection Time: 09/30/23  2:24 PM   Specimen: BLOOD  Result Value Ref Range Status   Specimen Description BLOOD BLOOD RIGHT ARM  Final   Special Requests   Final    BOTTLES DRAWN AEROBIC AND ANAEROBIC Blood Culture adequate volume   Culture   Final    NO GROWTH 3 DAYS Performed at [facility], [address], [place], [place] [address]    Report Status PENDING  Incomplete   Blood Culture (routine x 2)     Status: None (Preliminary result)   Collection Time: 09/30/23  2:24 PM   Specimen: BLOOD  Result Value Ref Range Status   Specimen Description BLOOD BLOOD RIGHT HAND  Final   Special Requests   Final    BOTTLES DRAWN AEROBIC ONLY Blood Culture results may not be optimal due to an inadequate volume of blood received in culture bottles   Culture   Final    NO GROWTH 3 DAYS Performed at [facility], [address] [place], [place] [address]    Report Status PENDING  Incomplete  MRSA Next Gen by PCR, Nasal     Status: None   Collection Time: 09/30/23  8:47 PM   Specimen: Nasal Mucosa; Nasal Swab  Result Value Ref Range Status   MRSA by PCR Next Gen NOT DETECTED NOT DETECTED Final    Comment: (NOTE) The GeneXpert MRSA Assay (FDA approved for NASAL specimens only), is one component of a comprehensive MRSA colonization surveillance program. It is not intended to diagnose MRSA infection nor to guide or monitor treatment for MRSA infections. Test performance is not FDA approved in patients less than 37 years old. Performed at [facility], [address] [place], [place] [address]     Labs: CBC: Recent Labs  Lab 09/30/23 1334 10/01/23 0456 10/02/23 0537 10/02/23 1837 10/03/23 0447  WBC 4.7 3.9* 3.5*  --  4.7  NEUTROABS 3.0  --   --   --   --   HGB 8.3* 7.7* 5.4* 9.3* 9.5*  HCT 23.1* 21.3* 15.2* 26.1* 26.5*  MCV 93.9 94.2 94.4  --  89.2  PLT 151 107* 83*  --  75*   Basic Metabolic Panel: Recent Labs  Lab 09/30/23 1334 10/01/23 0115 10/01/23 0456 10/02/23 0537 10/03/23 0447  NA 138 139 139 141 141  K 5.4* 4.6 4.8 3.3* 3.7  CL 108 109 109 113* 111  CO2 22 22 20* 22 21*  GLUCOSE 120* 119* 100* 93 75  BUN 18 17 16 12 13   CREATININE 1.09* 1.10* 1.04* 0.86 0.73  CALCIUM 6.0* 6.7* 6.8* 6.7* 6.5*  MG 1.7  --   --  1.7 1.9  PHOS 2.6  --   --  2.3* 2.5   Liver Function Tests: Recent Labs  Lab 09/30/23 1334  10/01/23 0115 10/02/23 0537 10/03/23 0447  AST 23 22  --   --  ALT 19 16  --   --   ALKPHOS 185* 149*  --   --   BILITOT 1.2 1.2  --   --   PROT 4.4* 4.6*  --   --   ALBUMIN <1.5* 2.1* 3.0* 2.7*   CBG: Recent Labs  Lab 10/02/23 1602 10/02/23 1934 10/03/23 0018 10/03/23 0604 10/03/23 0824  GLUCAP 120* 89 76 75 72    Discharge time spent: greater than 30 minutes.  Signed: Lurene Shadow, MD [facility] 10/03/2023

## 2023-10-03 NOTE — Care Management Important Message (Signed)
 Important Message  Patient Details  Name: Kathy [name] MRN: [id number] Date of Birth: 11-12-1940   Important Message Given:  Yes - Medicare IM     Zaide Mcclenahan W, CMA 10/03/2023, 11:00 AM

## 2023-10-03 NOTE — [place] FL2 (Signed)
 [place] MEDICAID FL2 LEVEL OF CARE FORM     IDENTIFICATION  Patient Name: Kathy [name] Birthdate: 10-06-1940 Sex: female Admission Date (Current Location): 09/30/2023  [place] and [place] Number:  Chiropodist and Address:  [facility], [address], [place], [place] [address]      Provider Number: [id number]  Attending Physician Name and Address:  Lurene Shadow, MD  Relative Name and Phone Number:  Rilei, Kravitz (Brother)  [phone] (Mobile)    Current Level of Care: Hospital Recommended Level of Care: Skilled Nursing Facility Prior Approval Number:    Date Approved/Denied:   PASRR Number: [id number]  Discharge Plan: SNF    Current Diagnoses: Patient Active Problem List   Diagnosis Date Noted   Hypoalbuminemia 09/30/2023   Hyperkalemia 09/30/2023   Gram-negative bacteremia 06/17/2023   Hypocalcemia 06/16/2023   Hypernatremia 05/18/2023   Protein-calorie malnutrition, severe 05/17/2023   Sepsis secondary to UTI (HCC) 05/12/2023   Electrolyte abnormality 05/12/2023   Thrombocytopenia (HCC) 05/12/2023   AMS (altered mental status) 05/12/2023   Multiple myeloma (HCC) 04/14/2023   Hypercalcemia of malignancy 04/07/2023   General weakness 04/07/2023   Recurrent falls 04/07/2023   Hypotension 04/07/2023   Hypoalbuminemia due to protein-calorie malnutrition (HCC) 04/07/2023   Acute prostatitis 04/07/2023   Chronic kidney disease, stage 3b (HCC) 04/07/2023   Dysphagia 04/07/2023   Acute pancreatitis 04/07/2023   History of thyroid cancer 04/07/2023   Anemia 03/30/2023   Physical deconditioning 03/30/2023   Elevated serum immunoglobulin free light chain level 03/30/2023   Leg swelling 02/21/2023   Hospital discharge follow-up 02/21/2023   Hypomagnesemia 02/21/2023   Urge incontinence of urine 02/21/2023   Hypercalcemia 01/23/2023   AKI (acute kidney injury) (HCC) 01/23/2023   Sensorineural hearing loss (SNHL) of both  ears 10/06/2022   Hammer toe of left foot 09/15/2022   Left foot pain 09/15/2022   Iron deficiency anemia due to chronic blood loss 07/19/2022   Rectal bleeding 01/21/2022   Gastrojejunal ulcer with hemorrhage    Encounter for general adult medical examination with abnormal findings 09/16/2021   Hypokalemia 09/16/2021   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   Bilateral impacted cerumen 11/19/2020   Hearing loss 11/19/2020   Malignant tumor of thyroid gland (HCC) 10/08/2020   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   Polyarthritis    Bariatric surgery status    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/2017   Rheumatoid arthritis (HCC) 11/16/2016   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   Rotator cuff arthropathy, 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   S/P shoulder replacement 05/11/2016   Allergic rhinitis 10/19/2013   GERD (gastroesophageal reflux disease) 10/19/2013   Osteoporosis    Essential hypertension    Acquired hypothyroidism    Hyperlipidemia    Anxiety    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   Unspecified protein-calorie malnutrition (HCC) 10/14/2011   Vitamin deficiency 10/14/2011   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    Orientation RESPIRATION BLADDER Height & Weight     Self, Time, Situation, Place  Normal External catheter, Incontinent Weight: 70.3 kg Height:  5\' 1"  (154.9 cm)  BEHAVIORAL SYMPTOMS/MOOD NEUROLOGICAL BOWEL NUTRITION STATUS  Other (Comment) (n/a)  (n/a) Continent Diet  AMBULATORY STATUS COMMUNICATION OF NEEDS Skin   Limited Assist Verbally Other (Comment) (eechymosis bilateral arm, weeping left arm)                       Personal Care Assistance  Level of Assistance  Bathing, Dressing Bathing Assistance: Limited assistance   Dressing Assistance: Limited assistance     Functional Limitations Info  Sight, Hearing Sight Info: Impaired Hearing Info: Adequate      SPECIAL  CARE FACTORS FREQUENCY                       Contractures Contractures Info: Not present    Additional Factors Info  Code Status, Allergies Code Status Info: DNR Allergies Info: Macrobid (Nitrofurantoin)           Current Medications (10/03/2023):  This is the current hospital active medication list Current Facility-Administered Medications  Medication Dose Route Frequency Provider Last Rate Last Admin   0.9 %  sodium chloride infusion (Manually program via Guardrails IV Fluids)   Intravenous Once Lurene Shadow, MD   Held at 10/02/23 0803   acetaminophen (TYLENOL) tablet 650 mg  650 mg Oral Q6H PRN Lurene Shadow, MD       Or   acetaminophen (TYLENOL) suppository 650 mg  650 mg Rectal Q6H PRN Lurene Shadow, MD       acyclovir (ZOVIRAX) 200 MG capsule 400 mg  400 mg Oral BID Foye Deer, RPH   400 mg at 10/03/23 1914   aspirin EC tablet 81 mg  81 mg Oral Daily Lurene Shadow, MD   81 mg at 10/03/23 7829   calcium carbonate (TUMS - dosed in mg elemental calcium) chewable tablet 200 mg of elemental calcium  1 tablet Oral TID WC Lurene Shadow, MD   200 mg of elemental calcium at 10/03/23 0839   Chlorhexidine Gluconate Cloth 2 % PADS 6 each  6 each Topical Daily Lurene Shadow, MD   6 each at 10/03/23 0839   enoxaparin (LOVENOX) injection 40 mg  40 mg Subcutaneous Q24H Lurene Shadow, MD   40 mg at 10/02/23 2126   levothyroxine (SYNTHROID) tablet 68.5 mcg  68.5 mcg Oral Q0600 Lurene Shadow, MD   68.5 mcg at 10/03/23 5621   midodrine (PROAMATINE) tablet 10 mg  10 mg Oral TID with meals Manuela Schwartz, NP   10 mg at 10/03/23 0840   multivitamin with minerals tablet 1 tablet  1 tablet Oral q morning Lurene Shadow, MD   1 tablet at 10/03/23 0840   ondansetron (ZOFRAN) tablet 4 mg  4 mg Oral Q6H PRN Lurene Shadow, MD       Or   ondansetron [facility]) injection 4 mg  4 mg Intravenous Q6H PRN Lurene Shadow, MD   4 mg at 10/01/23 1402   Oral care mouth rinse  15 mL Mouth Rinse PRN  Lurene Shadow, MD       oxybutynin (DITROPAN-XL) 24 hr tablet 10 mg  10 mg Oral QHS Lurene Shadow, MD   10 mg at 10/02/23 2125   pantoprazole (PROTONIX) EC tablet 40 mg  40 mg Oral Janne Lab, Adela Glimpse, MD   40 mg at 10/03/23 3086   polyethylene glycol (MIRALAX / GLYCOLAX) packet 17 g  17 g Oral Daily PRN Lurene Shadow, MD   17 g at 10/02/23 2000   protein supplement (ENSURE MAX) liquid  11 oz Oral Daily Manuela Schwartz, NP   11 oz at 10/03/23 0840   sodium chloride flush (NS) 0.9 % injection 10-40 mL  10-40 mL Intracatheter Q12H Lurene Shadow, MD   10 mL at 10/03/23 0840   sodium chloride flush (NS) 0.9 % injection 10-40 mL  10-40 mL Intracatheter PRN Lurene Shadow, MD  Discharge Medications: Please see discharge summary for a list of discharge medications.  Relevant Imaging Results:  Relevant Lab Results:   Additional Information SS #: [id number]  Truddie Hidden, RN

## 2023-10-03 NOTE — Plan of Care (Signed)
  Problem: Education: Goal: Knowledge of General Education information will improve Description: Including pain rating scale, medication(s)/side effects and non-pharmacologic comfort measures Outcome: Adequate for Discharge   Problem: Health Behavior/Discharge Planning: Goal: Ability to manage health-related needs will improve Outcome: Adequate for Discharge   Problem: Clinical Measurements: Goal: Ability to maintain clinical measurements within normal limits will improve Outcome: Adequate for Discharge Goal: Will remain free from infection Outcome: Adequate for Discharge Goal: Diagnostic test results will improve Outcome: Adequate for Discharge Goal: Respiratory complications will improve Outcome: Adequate for Discharge Goal: Cardiovascular complication will be avoided Outcome: Adequate for Discharge   Problem: Clinical Measurements: Goal: Will remain free from infection Outcome: Adequate for Discharge   Problem: Clinical Measurements: Goal: Diagnostic test results will improve Outcome: Adequate for Discharge

## 2023-10-03 NOTE — Plan of Care (Signed)
  Problem: Clinical Measurements: Goal: Will remain free from infection Outcome: Progressing Goal: Respiratory complications will improve Outcome: Progressing Goal: Cardiovascular complication will be avoided Outcome: Progressing   Problem: Coping: Goal: Level of anxiety will decrease Outcome: Progressing   Problem: Elimination: Goal: Will not experience complications related to urinary retention Outcome: Progressing   Problem: Safety: Goal: Ability to remain free from injury will improve Outcome: Progressing   Problem: Nutrition: Goal: Adequate nutrition will be maintained Outcome: Not Progressing   Problem: Elimination: Goal: Will not experience complications related to bowel motility Outcome: Not Progressing

## 2023-10-03 NOTE — Consult Note (Signed)
 [facility] Liaison Note  10/03/2023  Kathy [name] 1940-12-27 [id number]  Location: RN Hospital Liaison screened the patient remotely at [facility].  Insurance: [employer]   Kathy [name] is a 83 y.o. female who is a Primary Care Patient of Allena Katz, [name], MD  [facility]. The patient was screened for readmission hospitalization with noted extreme risk score for unplanned readmission risk with  in 6 months.  The patient was assessed for potential Care Management service needs for post hospital transition for care coordination. Review of patient's electronic medical record reveals patient was admitted for Hypotension. Pt is a long term resident at [facility]. No anticipated needs via [facility].   [facility] Care Management/Population Health does not replace or interfere with any arrangements made by the Inpatient Transition of Care team.   For questions contact:   Elliot Cousin, RN, BSN Hospital Liaison [place]   [facility], Population Health Office Hours MTWF  8:00 am-6:00 pm Direct Dial: [phone] mobile [email or web address]

## 2023-10-03 NOTE — Progress Notes (Signed)
 Patient transferred from ICU to room 246. Patient is A&O x4 with no c/o pain or discomfort. V/S obtained and tele monitor applied. Patient oriented to room. Call bell within reach and bed low & locked. Report received from [place], [place].

## 2023-10-04 LAB — TYPE AND SCREEN
ABO/RH(D): AB POS
Antibody Screen: POSITIVE
DAT, IgG: NEGATIVE
DAT, complement: NEGATIVE
Unit division: 0
Unit division: 0
Unit division: 0
Unit division: 0

## 2023-10-04 LAB — BPAM RBC
Blood Product Expiration Date: 202503312359
Blood Product Expiration Date: 202503312359
Blood Product Expiration Date: 202504062359
Blood Product Expiration Date: 202504062359
ISSUE DATE / TIME: 202503021121
ISSUE DATE / TIME: 202503021422
Unit Type and Rh: 6200
Unit Type and Rh: 6200
Unit Type and Rh: 6200
Unit Type and Rh: 6200

## 2023-10-05 DIAGNOSIS — I959 Hypotension, unspecified: Secondary | ICD-10-CM | POA: Diagnosis not present

## 2023-10-05 DIAGNOSIS — D630 Anemia in neoplastic disease: Secondary | ICD-10-CM | POA: Diagnosis not present

## 2023-10-05 DIAGNOSIS — C9000 Multiple myeloma not having achieved remission: Secondary | ICD-10-CM | POA: Diagnosis not present

## 2023-10-05 DIAGNOSIS — E8809 Other disorders of plasma-protein metabolism, not elsewhere classified: Secondary | ICD-10-CM | POA: Diagnosis not present

## 2023-10-05 DIAGNOSIS — R600 Localized edema: Secondary | ICD-10-CM | POA: Diagnosis not present

## 2023-10-05 DIAGNOSIS — E876 Hypokalemia: Secondary | ICD-10-CM | POA: Diagnosis not present

## 2023-10-05 LAB — CULTURE, BLOOD (ROUTINE X 2)
Culture: NO GROWTH
Culture: NO GROWTH
Special Requests: ADEQUATE

## 2023-10-06 ENCOUNTER — Inpatient Hospital Stay: Payer: 59

## 2023-10-06 ENCOUNTER — Inpatient Hospital Stay: Payer: 59 | Attending: Hematology

## 2023-10-06 ENCOUNTER — Inpatient Hospital Stay: Payer: 59 | Admitting: Hematology

## 2023-10-06 ENCOUNTER — Other Ambulatory Visit: Payer: Self-pay | Admitting: *Deleted

## 2023-10-06 VITALS — BP 116/81 | HR 90 | Temp 96.6°F | Resp 18

## 2023-10-06 DIAGNOSIS — Z5112 Encounter for antineoplastic immunotherapy: Secondary | ICD-10-CM | POA: Insufficient documentation

## 2023-10-06 DIAGNOSIS — C9000 Multiple myeloma not having achieved remission: Secondary | ICD-10-CM | POA: Diagnosis not present

## 2023-10-06 LAB — CBC WITH DIFFERENTIAL/PLATELET
Abs Immature Granulocytes: 0.01 10*3/uL (ref 0.00–0.07)
Basophils Absolute: 0 10*3/uL (ref 0.0–0.1)
Basophils Relative: 0 %
Eosinophils Absolute: 0 10*3/uL (ref 0.0–0.5)
Eosinophils Relative: 1 %
HCT: 31.5 % — ABNORMAL LOW (ref 36.0–46.0)
Hemoglobin: 10.6 g/dL — ABNORMAL LOW (ref 12.0–15.0)
Immature Granulocytes: 0 %
Lymphocytes Relative: 38 %
Lymphs Abs: 1 10*3/uL (ref 0.7–4.0)
MCH: 31.6 pg (ref 26.0–34.0)
MCHC: 33.7 g/dL (ref 30.0–36.0)
MCV: 94 fL (ref 80.0–100.0)
Monocytes Absolute: 0.3 10*3/uL (ref 0.1–1.0)
Monocytes Relative: 11 %
Neutro Abs: 1.3 10*3/uL — ABNORMAL LOW (ref 1.7–7.7)
Neutrophils Relative %: 50 %
Platelets: 106 10*3/uL — ABNORMAL LOW (ref 150–400)
RBC: 3.35 MIL/uL — ABNORMAL LOW (ref 3.87–5.11)
RDW: 17.8 % — ABNORMAL HIGH (ref 11.5–15.5)
WBC: 2.5 10*3/uL — ABNORMAL LOW (ref 4.0–10.5)
nRBC: 0 % (ref 0.0–0.2)

## 2023-10-06 LAB — COMPREHENSIVE METABOLIC PANEL
ALT: 20 U/L (ref 0–44)
AST: 18 U/L (ref 15–41)
Albumin: 2.6 g/dL — ABNORMAL LOW (ref 3.5–5.0)
Alkaline Phosphatase: 122 U/L (ref 38–126)
Anion gap: 11 (ref 5–15)
BUN: 10 mg/dL (ref 8–23)
CO2: 22 mmol/L (ref 22–32)
Calcium: 6.1 mg/dL — CL (ref 8.9–10.3)
Chloride: 108 mmol/L (ref 98–111)
Creatinine, Ser: 0.7 mg/dL (ref 0.44–1.00)
GFR, Estimated: >60 mL/min (ref >60)
Glucose, Bld: 83 mg/dL (ref 70–99)
Potassium: 3 mmol/L — ABNORMAL LOW (ref 3.5–5.1)
Sodium: 141 mmol/L (ref 135–145)
Total Bilirubin: 1.2 mg/dL (ref 0.0–1.2)
Total Protein: 5.1 g/dL — ABNORMAL LOW (ref 6.5–8.1)

## 2023-10-06 LAB — MAGNESIUM: Magnesium: 1.5 mg/dL — ABNORMAL LOW (ref 1.7–2.4)

## 2023-10-06 MED ORDER — SODIUM CHLORIDE 0.9 % IV SOLN
2.0000 g | Freq: Once | INTRAVENOUS | Status: DC
  Filled 2023-10-06: qty 20

## 2023-10-06 MED ORDER — ACETAMINOPHEN 325 MG PO TABS
650.0000 mg | ORAL_TABLET | Freq: Once | ORAL | Status: AC
  Administered 2023-10-06: 650 mg via ORAL
  Filled 2023-10-06: qty 2

## 2023-10-06 MED ORDER — HEPARIN SOD (PORK) LOCK FLUSH 100 UNIT/ML IV SOLN
500.0000 [IU] | Freq: Once | INTRAVENOUS | Status: AC
  Administered 2023-10-06: 500 [IU] via INTRAVENOUS

## 2023-10-06 MED ORDER — DEXAMETHASONE 4 MG PO TABS
20.0000 mg | ORAL_TABLET | Freq: Once | ORAL | Status: AC
  Administered 2023-10-06: 20 mg via ORAL
  Filled 2023-10-06: qty 5

## 2023-10-06 MED ORDER — MAGNESIUM SULFATE 2 GM/50ML IV SOLN
2.0000 g | Freq: Once | INTRAVENOUS | Status: AC
  Administered 2023-10-06: 2 g via INTRAVENOUS
  Filled 2023-10-06: qty 50

## 2023-10-06 MED ORDER — SODIUM CHLORIDE 0.9 % IV SOLN: 1.0000 g | Freq: Once | INTRAVENOUS | Status: DC

## 2023-10-06 MED ORDER — DARATUMUMAB-HYALURONIDASE-FIHJ 1800-30000 MG-UT/15ML ~~LOC~~ SOLN
1800.0000 mg | Freq: Once | SUBCUTANEOUS | Status: AC
  Administered 2023-10-06: 1800 mg via SUBCUTANEOUS
  Filled 2023-10-06: qty 15

## 2023-10-06 MED ORDER — SODIUM CHLORIDE 0.9 % IV SOLN: INTRAVENOUS | Status: DC

## 2023-10-06 MED ORDER — SODIUM CHLORIDE 0.9% FLUSH
10.0000 mL | INTRAVENOUS | Status: AC
  Administered 2023-10-06: 10 mL

## 2023-10-06 MED ORDER — CALCIUM GLUCONATE-NACL 1-0.675 GM/50ML-% IV SOLN
1.0000 g | INTRAVENOUS | Status: AC
  Administered 2023-10-06 (×2): 1000 mg via INTRAVENOUS
  Filled 2023-10-06: qty 50

## 2023-10-06 MED ORDER — SODIUM CHLORIDE 0.9% FLUSH
10.0000 mL | Freq: Once | INTRAVENOUS | Status: AC
  Administered 2023-10-06: 10 mL via INTRAVENOUS

## 2023-10-06 MED ORDER — POTASSIUM CHLORIDE CRYS ER 20 MEQ PO TBCR
40.0000 meq | EXTENDED_RELEASE_TABLET | Freq: Once | ORAL | Status: AC
  Administered 2023-10-06: 40 meq via ORAL
  Filled 2023-10-06: qty 2

## 2023-10-06 NOTE — Progress Notes (Signed)
 Patient presents today for Darzalex Faspro injection. Labs for treatment are within parameters. Calcium 6.1 and potassium 3.0.  Standing orders followed for potassium . 40 mEq KDUR PO x 1 dose.   Message received from A.Anderson RN / Dr. Ellin Saba to give 2 grams calcium gluconate for calcium 6.1. Magnesium 1.5 and will follow standing orders and receive 2 grams of Magnesium Sulfate today. ANC 1.3. Per Dr. Ellin Saba / A.Anderson RN proceed with treatment.   Treatment given today per MD orders. Tolerated infusion without adverse affects. Vital signs stable. No complaints at this time. Discharged from clinic by wheel chair in stable condition. Alert and oriented x 3. F/U with [facility] as scheduled.

## 2023-10-06 NOTE — Patient Instructions (Signed)
 [facility] [place] [facility] - A DEPT OF [facility]  Discharge Instructions: Thank you for choosing [facility] to provide your oncology and hematology care.  If you have a lab appointment with the Cancer Center - please note that after April 8th, 2024, all labs will be drawn in the cancer center.  You do not have to check in or register with the main entrance as you have in the past but will complete your check-in in the cancer center.  Wear comfortable clothing and clothing appropriate for easy access to any Portacath or PICC line.   We strive to give you quality time with your provider. You may need to reschedule your appointment if you arrive late (15 or more minutes).  Arriving late affects you and other patients whose appointments are after yours.  Also, if you miss three or more appointments without notifying the office, you may be dismissed from the clinic at the provider's discretion.      For prescription refill requests, have your pharmacy contact our office and allow 72 hours for refills to be completed.    Today you received the following chemotherapy and/or immunotherapy agents darzalex faspro. Daratumumab; Hyaluronidase Injection What is this medication? DARATUMUMAB; HYALURONIDASE (dar a toom ue mab; hye al ur ON i dase) treats multiple myeloma, a type of bone marrow cancer. Daratumumab works by blocking a protein that causes cancer cells to grow and multiply. This helps to slow or stop the spread of cancer cells. Hyaluronidase works by increasing the absorption of other medications in the body to help them work better. This medication may also be used treat amyloidosis, a condition that causes the buildup of a protein (amyloid) in your body. It works by reducing the buildup of this protein, which decreases symptoms. It is a combination medication that contains a monoclonal antibody. This medicine may be used for other purposes; ask your health care  provider or pharmacist if you have questions. COMMON BRAND NAME(S): DARZALEX FASPRO What should I tell my care team before I take this medication? They need to know if you have any of these conditions: Heart disease Infection, such as chickenpox, cold sores, herpes, hepatitis B Lung or breathing disease An unusual or allergic reaction to daratumumab, hyaluronidase, other medications, foods, dyes, or preservatives Pregnant or trying to get pregnant Breast-feeding How should I use this medication? This medication is injected under the skin. It is given by your care team in a hospital or clinic setting. Talk to your care team about the use of this medication in children. Special care may be needed. Overdosage: If you think you have taken too much of this medicine contact a poison control center or emergency room at once. NOTE: This medicine is only for you. Do not share this medicine with others. What if I miss a dose? Keep appointments for follow-up doses. It is important not to miss your dose. Call your care team if you are unable to keep an appointment. What may interact with this medication? Interactions have not been studied. This list may not describe all possible interactions. Give your health care provider a list of all the medicines, herbs, non-prescription drugs, or dietary supplements you use. Also tell them if you smoke, drink alcohol, or use illegal drugs. Some items may interact with your medicine. What should I watch for while using this medication? Your condition will be monitored carefully while you are receiving this medication. This medication can cause serious allergic  reactions. To reduce your risk, your care team may give you other medication to take before receiving this one. Be sure to follow the directions from your care team. This medication can affect the results of blood tests to match your blood type. These changes can last for up to 6 months after the final dose.  Your care team will do blood tests to match your blood type before you start treatment. Tell all of your care team that you are being treated with this medication before receiving a blood transfusion. This medication can affect the results of some tests used to determine treatment response; extra tests may be needed to evaluate response. Talk to your care team if you wish to become pregnant or think you are pregnant. This medication can cause serious birth defects if taken during pregnancy and for 3 months after the last dose. A reliable form of contraception is recommended while taking this medication and for 3 months after the last dose. Talk to your care team about effective forms of contraception. Do not breast-feed while taking this medication. What side effects may I notice from receiving this medication? Side effects that you should report to your care team as soon as possible: Allergic reactions--skin rash, itching, hives, swelling of the face, lips, tongue, or throat Heart rhythm changes--fast or irregular heartbeat, dizziness, feeling faint or lightheaded, chest pain, trouble breathing Infection--fever, chills, cough, sore throat, wounds that don't heal, pain or trouble when passing urine, general feeling of discomfort or being unwell Infusion reactions--chest pain, shortness of breath or trouble breathing, feeling faint or lightheaded Sudden eye pain or change in vision such as blurry vision, seeing halos around lights, vision loss Unusual bruising or bleeding Side effects that usually do not require medical attention (report to your care team if they continue or are bothersome): Constipation Diarrhea Fatigue Nausea Pain, tingling, or numbness in the hands or feet Swelling of the ankles, hands, or feet This list may not describe all possible side effects. Call your doctor for medical advice about side effects. You may report side effects to FDA at 1-800-FDA-1088. Where should I keep my  medication? This medication is given in a hospital or clinic. It will not be stored at home. NOTE: This sheet is a summary. It may not cover all possible information. If you have questions about this medicine, talk to your doctor, pharmacist, or health care provider.  2024 Elsevier/Gold Standard (2021-11-24 00:00:00)      To help prevent nausea and vomiting after your treatment, we encourage you to take your nausea medication as directed.  BELOW ARE SYMPTOMS THAT SHOULD BE REPORTED IMMEDIATELY: *FEVER GREATER THAN 100.4 F (38 C) OR HIGHER *CHILLS OR SWEATING *NAUSEA AND VOMITING THAT IS NOT CONTROLLED WITH YOUR NAUSEA MEDICATION *UNUSUAL SHORTNESS OF BREATH *UNUSUAL BRUISING OR BLEEDING *URINARY PROBLEMS (pain or burning when urinating, or frequent urination) *BOWEL PROBLEMS (unusual diarrhea, constipation, pain near the anus) TENDERNESS IN MOUTH AND THROAT WITH OR WITHOUT PRESENCE OF ULCERS (sore throat, sores in mouth, or a toothache) UNUSUAL RASH, SWELLING OR PAIN  UNUSUAL VAGINAL DISCHARGE OR ITCHING   Items with * indicate a potential emergency and should be followed up as soon as possible or go to the Emergency Department if any problems should occur.  Please show the CHEMOTHERAPY ALERT CARD or IMMUNOTHERAPY ALERT CARD at check-in to the Emergency Department and triage nurse.  Should you have questions after your visit or need to cancel or reschedule your appointment, please contact [facility] [place] [facility] - A DEPT OF [facility] [phone]  and follow the prompts.  Office hours are 8:00 a.m. to 4:30 p.m. Monday - Friday. Please note that voicemails left after 4:00 p.m. may not be returned until the following business day.  We are closed weekends and major holidays. You have access to a nurse at all times for urgent questions. Please call the main number to the clinic [phone] and follow the prompts.  For any non-urgent questions, you may also contact your  provider using MyChart. We now offer e-Visits for anyone 82 and older to request care online for non-urgent symptoms. For details visit [email or web address].   Also download the MyChart app! Go to the app store, search "MyChart", open the app, select [place], and log in with your MyChart username and password.

## 2023-10-07 DIAGNOSIS — R0989 Other specified symptoms and signs involving the circulatory and respiratory systems: Secondary | ICD-10-CM | POA: Diagnosis not present

## 2023-10-07 DIAGNOSIS — R059 Cough, unspecified: Secondary | ICD-10-CM | POA: Diagnosis not present

## 2023-10-07 DIAGNOSIS — I959 Hypotension, unspecified: Secondary | ICD-10-CM | POA: Diagnosis not present

## 2023-10-07 DIAGNOSIS — E878 Other disorders of electrolyte and fluid balance, not elsewhere classified: Secondary | ICD-10-CM | POA: Diagnosis not present

## 2023-10-07 DIAGNOSIS — C9000 Multiple myeloma not having achieved remission: Secondary | ICD-10-CM | POA: Diagnosis not present

## 2023-10-08 ENCOUNTER — Other Ambulatory Visit: Payer: Self-pay

## 2023-10-11 DIAGNOSIS — R600 Localized edema: Secondary | ICD-10-CM | POA: Diagnosis not present

## 2023-10-11 DIAGNOSIS — E876 Hypokalemia: Secondary | ICD-10-CM | POA: Diagnosis not present

## 2023-10-11 DIAGNOSIS — I959 Hypotension, unspecified: Secondary | ICD-10-CM | POA: Diagnosis not present

## 2023-10-11 DIAGNOSIS — E8809 Other disorders of plasma-protein metabolism, not elsewhere classified: Secondary | ICD-10-CM | POA: Diagnosis not present

## 2023-10-11 DIAGNOSIS — C9000 Multiple myeloma not having achieved remission: Secondary | ICD-10-CM | POA: Diagnosis not present

## 2023-10-11 DIAGNOSIS — D630 Anemia in neoplastic disease: Secondary | ICD-10-CM | POA: Diagnosis not present

## 2023-10-11 DIAGNOSIS — E878 Other disorders of electrolyte and fluid balance, not elsewhere classified: Secondary | ICD-10-CM | POA: Diagnosis not present

## 2023-10-12 DIAGNOSIS — R600 Localized edema: Secondary | ICD-10-CM | POA: Diagnosis not present

## 2023-10-12 DIAGNOSIS — R0989 Other specified symptoms and signs involving the circulatory and respiratory systems: Secondary | ICD-10-CM | POA: Diagnosis not present

## 2023-10-12 DIAGNOSIS — R059 Cough, unspecified: Secondary | ICD-10-CM | POA: Diagnosis not present

## 2023-10-12 DIAGNOSIS — E43 Unspecified severe protein-calorie malnutrition: Secondary | ICD-10-CM | POA: Diagnosis not present

## 2023-10-12 DIAGNOSIS — C9000 Multiple myeloma not having achieved remission: Secondary | ICD-10-CM | POA: Diagnosis not present

## 2023-10-12 DIAGNOSIS — I959 Hypotension, unspecified: Secondary | ICD-10-CM | POA: Diagnosis not present

## 2023-10-14 DIAGNOSIS — M6281 Muscle weakness (generalized): Secondary | ICD-10-CM | POA: Diagnosis not present

## 2023-10-14 DIAGNOSIS — R2689 Other abnormalities of gait and mobility: Secondary | ICD-10-CM | POA: Diagnosis not present

## 2023-10-19 ENCOUNTER — Inpatient Hospital Stay
Admission: EM | Admit: 2023-10-19 | Discharge: 2023-11-01 | DRG: 163 | Disposition: E | Source: Skilled Nursing Facility | Attending: Internal Medicine | Admitting: Internal Medicine

## 2023-10-19 ENCOUNTER — Other Ambulatory Visit: Payer: Self-pay

## 2023-10-19 ENCOUNTER — Emergency Department

## 2023-10-19 DIAGNOSIS — Z79899 Other long term (current) drug therapy: Secondary | ICD-10-CM

## 2023-10-19 DIAGNOSIS — Z90710 Acquired absence of both cervix and uterus: Secondary | ICD-10-CM

## 2023-10-19 DIAGNOSIS — Z96652 Presence of left artificial knee joint: Secondary | ICD-10-CM | POA: Diagnosis present

## 2023-10-19 DIAGNOSIS — I452 Bifascicular block: Secondary | ICD-10-CM | POA: Diagnosis present

## 2023-10-19 DIAGNOSIS — I959 Hypotension, unspecified: Secondary | ICD-10-CM | POA: Diagnosis present

## 2023-10-19 DIAGNOSIS — R918 Other nonspecific abnormal finding of lung field: Secondary | ICD-10-CM | POA: Diagnosis not present

## 2023-10-19 DIAGNOSIS — I2489 Other forms of acute ischemic heart disease: Secondary | ICD-10-CM | POA: Diagnosis not present

## 2023-10-19 DIAGNOSIS — I82433 Acute embolism and thrombosis of popliteal vein, bilateral: Secondary | ICD-10-CM | POA: Diagnosis present

## 2023-10-19 DIAGNOSIS — M810 Age-related osteoporosis without current pathological fracture: Secondary | ICD-10-CM | POA: Diagnosis present

## 2023-10-19 DIAGNOSIS — J101 Influenza due to other identified influenza virus with other respiratory manifestations: Secondary | ICD-10-CM | POA: Diagnosis not present

## 2023-10-19 DIAGNOSIS — Z79890 Hormone replacement therapy (postmenopausal): Secondary | ICD-10-CM

## 2023-10-19 DIAGNOSIS — D61810 Antineoplastic chemotherapy induced pancytopenia: Secondary | ICD-10-CM | POA: Diagnosis present

## 2023-10-19 DIAGNOSIS — C9000 Multiple myeloma not having achieved remission: Secondary | ICD-10-CM | POA: Diagnosis not present

## 2023-10-19 DIAGNOSIS — H269 Unspecified cataract: Secondary | ICD-10-CM | POA: Diagnosis not present

## 2023-10-19 DIAGNOSIS — M4856XA Collapsed vertebra, not elsewhere classified, lumbar region, initial encounter for fracture: Secondary | ICD-10-CM | POA: Diagnosis present

## 2023-10-19 DIAGNOSIS — D6959 Other secondary thrombocytopenia: Secondary | ICD-10-CM | POA: Diagnosis not present

## 2023-10-19 DIAGNOSIS — I824Y9 Acute embolism and thrombosis of unspecified deep veins of unspecified proximal lower extremity: Secondary | ICD-10-CM | POA: Diagnosis not present

## 2023-10-19 DIAGNOSIS — E876 Hypokalemia: Secondary | ICD-10-CM | POA: Diagnosis not present

## 2023-10-19 DIAGNOSIS — D61818 Other pancytopenia: Secondary | ICD-10-CM | POA: Diagnosis present

## 2023-10-19 DIAGNOSIS — M069 Rheumatoid arthritis, unspecified: Secondary | ICD-10-CM | POA: Diagnosis present

## 2023-10-19 DIAGNOSIS — I3139 Other pericardial effusion (noninflammatory): Secondary | ICD-10-CM | POA: Diagnosis not present

## 2023-10-19 DIAGNOSIS — E039 Hypothyroidism, unspecified: Secondary | ICD-10-CM | POA: Diagnosis not present

## 2023-10-19 DIAGNOSIS — Z96611 Presence of right artificial shoulder joint: Secondary | ICD-10-CM | POA: Diagnosis present

## 2023-10-19 DIAGNOSIS — W19XXXA Unspecified fall, initial encounter: Secondary | ICD-10-CM | POA: Diagnosis present

## 2023-10-19 DIAGNOSIS — Z6823 Body mass index (BMI) 23.0-23.9, adult: Secondary | ICD-10-CM

## 2023-10-19 DIAGNOSIS — I130 Hypertensive heart and chronic kidney disease with heart failure and stage 1 through stage 4 chronic kidney disease, or unspecified chronic kidney disease: Secondary | ICD-10-CM | POA: Diagnosis present

## 2023-10-19 DIAGNOSIS — E878 Other disorders of electrolyte and fluid balance, not elsewhere classified: Secondary | ICD-10-CM | POA: Diagnosis not present

## 2023-10-19 DIAGNOSIS — R0989 Other specified symptoms and signs involving the circulatory and respiratory systems: Secondary | ICD-10-CM | POA: Diagnosis not present

## 2023-10-19 DIAGNOSIS — I2609 Other pulmonary embolism with acute cor pulmonale: Secondary | ICD-10-CM

## 2023-10-19 DIAGNOSIS — R531 Weakness: Secondary | ICD-10-CM | POA: Diagnosis not present

## 2023-10-19 DIAGNOSIS — D638 Anemia in other chronic diseases classified elsewhere: Secondary | ICD-10-CM | POA: Diagnosis not present

## 2023-10-19 DIAGNOSIS — Y92009 Unspecified place in unspecified non-institutional (private) residence as the place of occurrence of the external cause: Secondary | ICD-10-CM | POA: Diagnosis not present

## 2023-10-19 DIAGNOSIS — Z7983 Long term (current) use of bisphosphonates: Secondary | ICD-10-CM

## 2023-10-19 DIAGNOSIS — I2699 Other pulmonary embolism without acute cor pulmonale: Principal | ICD-10-CM

## 2023-10-19 DIAGNOSIS — I82413 Acute embolism and thrombosis of femoral vein, bilateral: Secondary | ICD-10-CM | POA: Diagnosis present

## 2023-10-19 DIAGNOSIS — E669 Obesity, unspecified: Secondary | ICD-10-CM | POA: Diagnosis present

## 2023-10-19 DIAGNOSIS — R000 Tachycardia, unspecified: Secondary | ICD-10-CM | POA: Diagnosis not present

## 2023-10-19 DIAGNOSIS — Z833 Family history of diabetes mellitus: Secondary | ICD-10-CM

## 2023-10-19 DIAGNOSIS — I82432 Acute embolism and thrombosis of left popliteal vein: Secondary | ICD-10-CM | POA: Diagnosis not present

## 2023-10-19 DIAGNOSIS — I700 Atherosclerosis of aorta: Secondary | ICD-10-CM | POA: Diagnosis present

## 2023-10-19 DIAGNOSIS — Z801 Family history of malignant neoplasm of trachea, bronchus and lung: Secondary | ICD-10-CM

## 2023-10-19 DIAGNOSIS — I9589 Other hypotension: Secondary | ICD-10-CM

## 2023-10-19 DIAGNOSIS — Z96612 Presence of left artificial shoulder joint: Secondary | ICD-10-CM | POA: Diagnosis present

## 2023-10-19 DIAGNOSIS — J9601 Acute respiratory failure with hypoxia: Secondary | ICD-10-CM | POA: Diagnosis present

## 2023-10-19 DIAGNOSIS — E46 Unspecified protein-calorie malnutrition: Secondary | ICD-10-CM | POA: Diagnosis not present

## 2023-10-19 DIAGNOSIS — I5032 Chronic diastolic (congestive) heart failure: Secondary | ICD-10-CM | POA: Diagnosis present

## 2023-10-19 DIAGNOSIS — E785 Hyperlipidemia, unspecified: Secondary | ICD-10-CM | POA: Diagnosis present

## 2023-10-19 DIAGNOSIS — M545 Low back pain, unspecified: Secondary | ICD-10-CM

## 2023-10-19 DIAGNOSIS — N1832 Chronic kidney disease, stage 3b: Secondary | ICD-10-CM | POA: Diagnosis not present

## 2023-10-19 DIAGNOSIS — E8809 Other disorders of plasma-protein metabolism, not elsewhere classified: Secondary | ICD-10-CM | POA: Diagnosis present

## 2023-10-19 DIAGNOSIS — F419 Anxiety disorder, unspecified: Secondary | ICD-10-CM | POA: Diagnosis present

## 2023-10-19 DIAGNOSIS — I5A Non-ischemic myocardial injury (non-traumatic): Secondary | ICD-10-CM | POA: Diagnosis not present

## 2023-10-19 DIAGNOSIS — R339 Retention of urine, unspecified: Secondary | ICD-10-CM | POA: Diagnosis present

## 2023-10-19 DIAGNOSIS — Z8579 Personal history of other malignant neoplasms of lymphoid, hematopoietic and related tissues: Secondary | ICD-10-CM

## 2023-10-19 DIAGNOSIS — R9431 Abnormal electrocardiogram [ECG] [EKG]: Secondary | ICD-10-CM

## 2023-10-19 DIAGNOSIS — S199XXA Unspecified injury of neck, initial encounter: Secondary | ICD-10-CM | POA: Diagnosis not present

## 2023-10-19 DIAGNOSIS — Z96642 Presence of left artificial hip joint: Secondary | ICD-10-CM | POA: Diagnosis present

## 2023-10-19 DIAGNOSIS — Z8711 Personal history of peptic ulcer disease: Secondary | ICD-10-CM

## 2023-10-19 DIAGNOSIS — Z66 Do not resuscitate: Secondary | ICD-10-CM | POA: Diagnosis not present

## 2023-10-19 DIAGNOSIS — I82412 Acute embolism and thrombosis of left femoral vein: Secondary | ICD-10-CM | POA: Diagnosis not present

## 2023-10-19 DIAGNOSIS — Z515 Encounter for palliative care: Secondary | ICD-10-CM | POA: Diagnosis not present

## 2023-10-19 DIAGNOSIS — I82409 Acute embolism and thrombosis of unspecified deep veins of unspecified lower extremity: Secondary | ICD-10-CM | POA: Diagnosis present

## 2023-10-19 DIAGNOSIS — M4854XA Collapsed vertebra, not elsewhere classified, thoracic region, initial encounter for fracture: Secondary | ICD-10-CM | POA: Diagnosis present

## 2023-10-19 DIAGNOSIS — E890 Postprocedural hypothyroidism: Secondary | ICD-10-CM | POA: Diagnosis not present

## 2023-10-19 DIAGNOSIS — K2100 Gastro-esophageal reflux disease with esophagitis, without bleeding: Secondary | ICD-10-CM | POA: Diagnosis present

## 2023-10-19 DIAGNOSIS — Z8249 Family history of ischemic heart disease and other diseases of the circulatory system: Secondary | ICD-10-CM

## 2023-10-19 DIAGNOSIS — I672 Cerebral atherosclerosis: Secondary | ICD-10-CM | POA: Diagnosis not present

## 2023-10-19 DIAGNOSIS — S32020A Wedge compression fracture of second lumbar vertebra, initial encounter for closed fracture: Secondary | ICD-10-CM | POA: Diagnosis present

## 2023-10-19 DIAGNOSIS — I129 Hypertensive chronic kidney disease with stage 1 through stage 4 chronic kidney disease, or unspecified chronic kidney disease: Secondary | ICD-10-CM | POA: Diagnosis not present

## 2023-10-19 DIAGNOSIS — R2689 Other abnormalities of gait and mobility: Secondary | ICD-10-CM | POA: Diagnosis not present

## 2023-10-19 DIAGNOSIS — R059 Cough, unspecified: Secondary | ICD-10-CM | POA: Diagnosis not present

## 2023-10-19 DIAGNOSIS — S22050A Wedge compression fracture of T5-T6 vertebra, initial encounter for closed fracture: Secondary | ICD-10-CM | POA: Diagnosis present

## 2023-10-19 DIAGNOSIS — T451X5A Adverse effect of antineoplastic and immunosuppressive drugs, initial encounter: Secondary | ICD-10-CM | POA: Diagnosis present

## 2023-10-19 DIAGNOSIS — I480 Paroxysmal atrial fibrillation: Secondary | ICD-10-CM | POA: Diagnosis present

## 2023-10-19 DIAGNOSIS — R54 Age-related physical debility: Secondary | ICD-10-CM | POA: Diagnosis present

## 2023-10-19 DIAGNOSIS — Z7982 Long term (current) use of aspirin: Secondary | ICD-10-CM

## 2023-10-19 DIAGNOSIS — J9811 Atelectasis: Secondary | ICD-10-CM | POA: Diagnosis not present

## 2023-10-19 DIAGNOSIS — R0603 Acute respiratory distress: Secondary | ICD-10-CM | POA: Diagnosis not present

## 2023-10-19 DIAGNOSIS — I824Y3 Acute embolism and thrombosis of unspecified deep veins of proximal lower extremity, bilateral: Secondary | ICD-10-CM | POA: Diagnosis not present

## 2023-10-19 DIAGNOSIS — S0990XA Unspecified injury of head, initial encounter: Secondary | ICD-10-CM | POA: Diagnosis not present

## 2023-10-19 DIAGNOSIS — R5381 Other malaise: Secondary | ICD-10-CM | POA: Diagnosis not present

## 2023-10-19 DIAGNOSIS — Z1152 Encounter for screening for COVID-19: Secondary | ICD-10-CM | POA: Diagnosis not present

## 2023-10-19 DIAGNOSIS — M6281 Muscle weakness (generalized): Secondary | ICD-10-CM | POA: Diagnosis not present

## 2023-10-19 DIAGNOSIS — I82812 Embolism and thrombosis of superficial veins of left lower extremities: Secondary | ICD-10-CM | POA: Diagnosis not present

## 2023-10-19 DIAGNOSIS — R9089 Other abnormal findings on diagnostic imaging of central nervous system: Secondary | ICD-10-CM | POA: Diagnosis not present

## 2023-10-19 DIAGNOSIS — M7989 Other specified soft tissue disorders: Secondary | ICD-10-CM | POA: Diagnosis not present

## 2023-10-19 DIAGNOSIS — Z9884 Bariatric surgery status: Secondary | ICD-10-CM

## 2023-10-19 HISTORY — DX: Chronic diastolic (congestive) heart failure: I50.32

## 2023-10-19 LAB — RESP PANEL BY RT-PCR (RSV, FLU A&B, COVID)  RVPGX2
Influenza A by PCR: POSITIVE — AB
Influenza B by PCR: NEGATIVE
Resp Syncytial Virus by PCR: NEGATIVE
SARS Coronavirus 2 by RT PCR: NEGATIVE

## 2023-10-19 LAB — COMPREHENSIVE METABOLIC PANEL
ALT: 14 U/L (ref 0–44)
AST: 13 U/L — ABNORMAL LOW (ref 15–41)
Albumin: 2 g/dL — ABNORMAL LOW (ref 3.5–5.0)
Alkaline Phosphatase: 101 U/L (ref 38–126)
Anion gap: 10 (ref 5–15)
BUN: 13 mg/dL (ref 8–23)
CO2: 29 mmol/L (ref 22–32)
Calcium: 6.2 mg/dL — CL (ref 8.9–10.3)
Chloride: 103 mmol/L (ref 98–111)
Creatinine, Ser: 0.77 mg/dL (ref 0.44–1.00)
GFR, Estimated: >60 mL/min (ref >60)
Glucose, Bld: 99 mg/dL (ref 70–99)
Potassium: <2 mmol/L — CL (ref 3.5–5.1)
Sodium: 142 mmol/L (ref 135–145)
Total Bilirubin: 1.7 mg/dL — ABNORMAL HIGH (ref 0.0–1.2)
Total Protein: 4.8 g/dL — ABNORMAL LOW (ref 6.5–8.1)

## 2023-10-19 LAB — CBC WITH DIFFERENTIAL/PLATELET
Abs Immature Granulocytes: 0.02 10*3/uL (ref 0.00–0.07)
Basophils Absolute: 0 10*3/uL (ref 0.0–0.1)
Basophils Relative: 1 %
Eosinophils Absolute: 0.1 10*3/uL (ref 0.0–0.5)
Eosinophils Relative: 3 %
HCT: 29.5 % — ABNORMAL LOW (ref 36.0–46.0)
Hemoglobin: 10.1 g/dL — ABNORMAL LOW (ref 12.0–15.0)
Immature Granulocytes: 1 %
Lymphocytes Relative: 57 %
Lymphs Abs: 1 10*3/uL (ref 0.7–4.0)
MCH: 32.3 pg (ref 26.0–34.0)
MCHC: 34.2 g/dL (ref 30.0–36.0)
MCV: 94.2 fL (ref 80.0–100.0)
Monocytes Absolute: 0.1 10*3/uL (ref 0.1–1.0)
Monocytes Relative: 6 %
Neutro Abs: 0.5 10*3/uL — ABNORMAL LOW (ref 1.7–7.7)
Neutrophils Relative %: 32 %
Platelets: 56 10*3/uL — ABNORMAL LOW (ref 150–400)
RBC: 3.13 MIL/uL — ABNORMAL LOW (ref 3.87–5.11)
RDW: 18.2 % — ABNORMAL HIGH (ref 11.5–15.5)
Smear Review: NORMAL
WBC: 1.7 10*3/uL — ABNORMAL LOW (ref 4.0–10.5)
nRBC: 1.2 % — ABNORMAL HIGH (ref 0.0–0.2)

## 2023-10-19 LAB — BRAIN NATRIURETIC PEPTIDE: B Natriuretic Peptide: 713.1 pg/mL — ABNORMAL HIGH (ref 0.0–100.0)

## 2023-10-19 LAB — PROTIME-INR
INR: 1.5 — ABNORMAL HIGH (ref 0.8–1.2)
Prothrombin Time: 18.1 s — ABNORMAL HIGH (ref 11.4–15.2)

## 2023-10-19 LAB — APTT: aPTT: 39 s — ABNORMAL HIGH (ref 24–36)

## 2023-10-19 LAB — MAGNESIUM: Magnesium: 1.7 mg/dL (ref 1.7–2.4)

## 2023-10-19 LAB — PHOSPHORUS: Phosphorus: <1 mg/dL — CL (ref 2.5–4.6)

## 2023-10-19 LAB — TROPONIN I (HIGH SENSITIVITY): Troponin I (High Sensitivity): 62 ng/L — ABNORMAL HIGH (ref <18)

## 2023-10-19 MED ORDER — SODIUM CHLORIDE 0.9 % IV BOLUS
500.0000 mL | Freq: Once | INTRAVENOUS | Status: AC
  Administered 2023-10-20: 500 mL via INTRAVENOUS

## 2023-10-19 MED ORDER — POTASSIUM PHOSPHATES 15 MMOLE/5ML IV SOLN: 15.0000 mmol | Freq: Once | INTRAVENOUS | Status: DC

## 2023-10-19 MED ORDER — DIPHENHYDRAMINE HCL 50 MG/ML IJ SOLN
12.5000 mg | Freq: Three times a day (TID) | INTRAMUSCULAR | Status: DC | PRN
  Administered 2023-10-20: 12.5 mg via INTRAVENOUS
  Filled 2023-10-19: qty 1

## 2023-10-19 MED ORDER — CALCIUM GLUCONATE-NACL 1-0.675 GM/50ML-% IV SOLN
1.0000 g | Freq: Once | INTRAVENOUS | Status: AC
  Administered 2023-10-20: 1000 mg via INTRAVENOUS
  Filled 2023-10-19: qty 50

## 2023-10-19 MED ORDER — IOHEXOL 350 MG/ML SOLN
75.0000 mL | Freq: Once | INTRAVENOUS | Status: AC | PRN
  Administered 2023-10-19: 75 mL via INTRAVENOUS

## 2023-10-19 MED ORDER — SODIUM CHLORIDE 0.9 % IV SOLN: INTRAVENOUS | Status: AC

## 2023-10-19 MED ORDER — HEPARIN (PORCINE) 25000 UT/250ML-% IV SOLN: 1100.0000 [IU]/h | INTRAVENOUS | Status: DC

## 2023-10-19 MED ORDER — METHOCARBAMOL 500 MG PO TABS
500.0000 mg | ORAL_TABLET | Freq: Three times a day (TID) | ORAL | Status: DC | PRN
  Administered 2023-10-22: 500 mg via ORAL
  Filled 2023-10-19: qty 1

## 2023-10-19 MED ORDER — OXYCODONE-ACETAMINOPHEN 5-325 MG PO TABS
1.0000 | ORAL_TABLET | ORAL | Status: DC | PRN
  Filled 2023-10-19: qty 1

## 2023-10-19 MED ORDER — MIDODRINE HCL 5 MG PO TABS
7.5000 mg | ORAL_TABLET | Freq: Three times a day (TID) | ORAL | Status: DC
  Administered 2023-10-20 – 2023-10-22 (×6): 7.5 mg via ORAL
  Filled 2023-10-19 (×9): qty 2

## 2023-10-19 MED ORDER — MAGNESIUM SULFATE 2 GM/50ML IV SOLN
2.0000 g | Freq: Once | INTRAVENOUS | Status: AC
  Administered 2023-10-19: 2 g via INTRAVENOUS
  Filled 2023-10-19: qty 50

## 2023-10-19 MED ORDER — LACTATED RINGERS IV BOLUS
500.0000 mL | Freq: Once | INTRAVENOUS | Status: AC
  Administered 2023-10-19: 500 mL via INTRAVENOUS

## 2023-10-19 MED ORDER — POTASSIUM PHOSPHATES 15 MMOLE/5ML IV SOLN
30.0000 mmol | Freq: Once | INTRAVENOUS | Status: AC
  Administered 2023-10-20: 30 mmol via INTRAVENOUS
  Filled 2023-10-19: qty 10

## 2023-10-19 MED ORDER — POTASSIUM CHLORIDE 10 MEQ/100ML IV SOLN
10.0000 meq | INTRAVENOUS | Status: AC
  Administered 2023-10-19 – 2023-10-20 (×6): 10 meq via INTRAVENOUS
  Filled 2023-10-19 (×6): qty 100

## 2023-10-19 MED ORDER — LIDOCAINE 5 % EX PTCH
1.0000 | MEDICATED_PATCH | CUTANEOUS | Status: DC
  Administered 2023-10-19 – 2023-10-22 (×4): 1 via TRANSDERMAL
  Filled 2023-10-19 (×5): qty 1

## 2023-10-19 MED ORDER — LEVOTHYROXINE SODIUM 137 MCG PO TABS
137.0000 ug | ORAL_TABLET | Freq: Every day | ORAL | Status: DC
  Administered 2023-10-20: 137 ug via ORAL
  Filled 2023-10-19: qty 1

## 2023-10-19 MED ORDER — POLYETHYLENE GLYCOL 3350 17 G PO PACK: 17.0000 g | PACK | Freq: Every day | ORAL | Status: DC | PRN

## 2023-10-19 MED ORDER — DM-GUAIFENESIN ER 30-600 MG PO TB12
1.0000 | ORAL_TABLET | Freq: Two times a day (BID) | ORAL | Status: DC | PRN
  Administered 2023-10-21: 1 via ORAL
  Filled 2023-10-19 (×2): qty 1

## 2023-10-19 MED ORDER — ADULT MULTIVITAMIN W/MINERALS CH
1.0000 | ORAL_TABLET | Freq: Every morning | ORAL | Status: DC
  Administered 2023-10-20 – 2023-10-22 (×3): 1 via ORAL
  Filled 2023-10-19 (×3): qty 1

## 2023-10-19 MED ORDER — POTASSIUM & SODIUM PHOSPHATES 280-160-250 MG PO PACK: 1.0000 | PACK | Freq: Once | ORAL | Status: DC

## 2023-10-19 MED ORDER — MORPHINE SULFATE (PF) 2 MG/ML IV SOLN: 1.0000 mg | INTRAVENOUS | Status: DC | PRN

## 2023-10-19 MED ORDER — MAGNESIUM SULFATE 2 GM/50ML IV SOLN: 2.0000 g | Freq: Once | INTRAVENOUS | Status: DC

## 2023-10-19 MED ORDER — OSELTAMIVIR PHOSPHATE 30 MG PO CAPS
30.0000 mg | ORAL_CAPSULE | Freq: Two times a day (BID) | ORAL | Status: DC
  Administered 2023-10-19 – 2023-10-22 (×7): 30 mg via ORAL
  Filled 2023-10-19 (×9): qty 1

## 2023-10-19 MED ORDER — ASPIRIN 81 MG PO TBEC
81.0000 mg | DELAYED_RELEASE_TABLET | Freq: Every day | ORAL | Status: DC
  Administered 2023-10-20 – 2023-10-22 (×3): 81 mg via ORAL
  Filled 2023-10-19 (×5): qty 1

## 2023-10-19 MED ORDER — IPRATROPIUM-ALBUTEROL 0.5-2.5 (3) MG/3ML IN SOLN
3.0000 mL | RESPIRATORY_TRACT | Status: DC
  Administered 2023-10-19 – 2023-10-20 (×6): 3 mL via RESPIRATORY_TRACT
  Filled 2023-10-19 (×6): qty 3

## 2023-10-19 MED ORDER — FERROUS SULFATE 325 (65 FE) MG PO TABS
325.0000 mg | ORAL_TABLET | Freq: Every day | ORAL | Status: DC
  Administered 2023-10-20 – 2023-10-22 (×3): 325 mg via ORAL
  Filled 2023-10-19 (×4): qty 1

## 2023-10-19 MED ORDER — OXYBUTYNIN CHLORIDE ER 5 MG PO TB24
10.0000 mg | ORAL_TABLET | Freq: Every day | ORAL | Status: DC
  Administered 2023-10-20 – 2023-10-22 (×3): 10 mg via ORAL
  Filled 2023-10-19: qty 2
  Filled 2023-10-19 (×3): qty 1

## 2023-10-19 MED ORDER — HEPARIN (PORCINE) 25000 UT/250ML-% IV SOLN
1450.0000 [IU]/h | INTRAVENOUS | Status: DC
  Administered 2023-10-19 – 2023-10-20 (×2): 1100 [IU]/h via INTRAVENOUS
  Administered 2023-10-21: 1250 [IU]/h via INTRAVENOUS
  Filled 2023-10-19 (×3): qty 250

## 2023-10-19 MED ORDER — HEPARIN BOLUS VIA INFUSION
3900.0000 [IU] | Freq: Once | INTRAVENOUS | Status: DC
  Filled 2023-10-19: qty 3900

## 2023-10-19 MED ORDER — POTASSIUM CHLORIDE 10 MEQ/100ML IV SOLN
10.0000 meq | Freq: Once | INTRAVENOUS | Status: DC
Start: 2023-10-19 — End: 2023-10-19

## 2023-10-19 MED ORDER — PANTOPRAZOLE SODIUM 40 MG PO TBEC
40.0000 mg | DELAYED_RELEASE_TABLET | Freq: Every day | ORAL | Status: DC
  Administered 2023-10-20 – 2023-10-22 (×3): 40 mg via ORAL
  Filled 2023-10-19 (×4): qty 1

## 2023-10-19 MED ORDER — ACYCLOVIR 200 MG PO CAPS
400.0000 mg | ORAL_CAPSULE | Freq: Two times a day (BID) | ORAL | Status: DC
Start: 2023-10-20 — End: 2023-10-23
  Administered 2023-10-20 – 2023-10-22 (×6): 400 mg via ORAL
  Filled 2023-10-19 (×9): qty 2

## 2023-10-19 MED ORDER — ALBUTEROL SULFATE (2.5 MG/3ML) 0.083% IN NEBU: 2.5000 mg | INHALATION_SOLUTION | RESPIRATORY_TRACT | Status: DC | PRN

## 2023-10-19 MED ORDER — MAGNESIUM SULFATE 4 GM/100ML IV SOLN
4.0000 g | Freq: Once | INTRAVENOUS | Status: DC
Start: 2023-10-20 — End: 2023-10-19
  Filled 2023-10-19: qty 100

## 2023-10-19 MED ORDER — HEPARIN BOLUS VIA INFUSION
3900.0000 [IU] | Freq: Once | INTRAVENOUS | Status: AC
  Administered 2023-10-19: 3900 [IU] via INTRAVENOUS
  Filled 2023-10-19: qty 3900

## 2023-10-19 MED ORDER — ACETAMINOPHEN 325 MG PO TABS
650.0000 mg | ORAL_TABLET | Freq: Four times a day (QID) | ORAL | Status: DC | PRN
  Administered 2023-10-22 – 2023-10-23 (×2): 650 mg via ORAL
  Filled 2023-10-19 (×2): qty 2

## 2023-10-19 NOTE — Progress Notes (Signed)
 PHARMACY CONSULT NOTE - ELECTROLYTES  Pharmacy Consult for Electrolyte Monitoring and Replacement   Recent Labs:   Estimated Creatinine Clearance: 51.4 mL/min (by C-G formula based on SCr of 0.77 mg/dL). Potassium (mmol/L)  Date Value  10/19/2023 <2.0 (LL)   Magnesium (mg/dL)  Date Value  84/69/6295 1.7   Calcium (mg/dL)  Date Value  28/41/3244 6.2 (LL)   Albumin (g/dL)  Date Value  08/04/7251 2.0 (L)  03/18/2021 3.2 (L)   Phosphorus (mg/dL)  Date Value  66/44/0347 <1.0 (LL)   Sodium (mmol/L)  Date Value  10/19/2023 142  02/23/2023 135   Corrected Ca: 7.8 mg/dL  Assessment  Kathy [name] is a 83 y.o. female presenting with weakness. PMH significant for multiple myeloma, CKD, RA, chronic hypotension on midodrine, OSA, hypertension, hyperlipidemia, hypothyroidism. Pharmacy has been consulted to monitor and replace electrolytes.  Goal of Therapy: Electrolytes WNL  Plan:  K < 2.0: Give KCL 10 mEq IV x6 doses Re-check potassium level 2-3 hours after completing last dose Mg 1.7: Give magnesium sulfate 2 g IV x1 Phos < 1.0: Provider has ordered Kphos 30 mmol IV x1 Recheck phosphorus level 6 hours after completing dose Check BMP, Mg, Phos with AM labs  Thank you for allowing pharmacy to be a part of this patient's care.  Rockwell Alexandria, PharmD Clinical Pharmacist 10/19/2023 7:10 PM

## 2023-10-19 NOTE — H&P (Incomplete)
 History and Physical    Kathy [name] NFA:[id number] DOB: 01-26-1941 DOA: 10/19/2023  Referring MD/NP/PA:   PCP: [name], MD   Patient coming from:  The patient is coming from SNF   Chief Complaint: cough, SOB, lightheaded, and abnormal lab with hypokalemia  HPI: Kathy [name] is a 83 y.o. female with medical history significant of multiple myeloma on chemotherapy, hypotension on midodrine, HLD, hypothyroidism, anxiety, CKD stage IIIb, rheumatoid arthritis, stomach ulcer, pancytopenia, who presents with cough, SOB, lightheadedness and abnormal lab with hypokalemia.  Per report, pt was found to have hypokalemia with a potassium of 1.9 in facility.  Patient states that she has dry cough, mild SOB, and lightheadedness.  She has mild fluid chest discomfort.  No fever or chills.  No sore throat.  Patient does not have nausea, vomiting, diarrhea or abdominal pain.  Denies symptoms of UTI.  No rectal bleeding.  No dark stool.  She has generalized weakness.  She had fall, no significant injury.  No loss of consciousness.  No unilateral numbness or tingling in extremities. She has some lower back pain.     Data reviewed independently and ED Course: pt was found to have pancytopenia with WBC 1.7, hemoglobin 10.1, platelets 56 (patient had WBC 2.5, hemoglobin 10.6, platelets are 106 on 10/06/2023), positive PCR for flu A, troponin 62, electrolytes disturbance including hypokalemia with potassium<2.0, magnesium 1.7, phosphorus<1.0, calcium 6.2 which is a corrected to 7.8. GFR> 60, BNP 713.  Blood pressure 106/68, 89/62 (MAP 70), heart rate of 102, RR 18, oxygen saturation 100% on 42 oxygen (pt is on 2L of oxygen at baseline per patient).  Chest x-ray negative.  CT of head and C-spine negative for acute injury, but showed lytic none lesions. Pt is admitted to SDU as inpt.    CTA: IMPRESSION: 1. Acute pulmonary embolus in the right and left pulmonary arteries and in their lobar and  segmental branches. Clot burden is high. Positive for acute PE with CT evidence of right heart strain (RV/LV Ratio = 1.45) consistent with at least submassive (intermediate risk) PE. The presence of right heart strain has been associated with an increased risk of morbidity and mortality. Please refer to the "Code PE Focused" order set in EPIC. 2. New moderate to large pericardial effusion compared to 03/11/2023 likewise not present on the CT abdomen from 05/11/2023. Tamponade not excluded, and the picture may be difficult to sort on physical exam due to the pulmonary embolus with right heart dysfunction. If clinically warranted, echocardiography could be utilized for further characterization. 3. Hazy peripheral ground-glass opacities in both lungs nonspecific in could be a manifestation of pulmonary hemorrhage, edema, atypical pneumonia, or acute hypersensitivity pneumonitis. 4. Substantial airway plugging in both lower lobes with atelectasis in both lower lobes. 5. Mildly dilated and thick-walled mid and distal thoracic esophagus. Air fluid level in the esophagus, possibly from dysmotility or reflux. 6. New anterior wedge compression fracture at T5. New superior endplate compression fracture at L2. Partially healed new fractures of the sternal body and manubrium. 7.  Aortic Atherosclerosis (ICD10-I70.0).   Lower extremity venous Doppler: In the right lower extremity, nonocclusive thrombus noted in the right common femoral vein, femoral vein and popliteal vein.   In the left lower extremity, nonocclusive thrombus noted in the left common femoral vein, femoral vein, popliteal vein, and greater saphenous vein.     EKG: I have personally reviewed.  Sinus rhythm, QTc 535, low voltage, bifascicular block, early R wave progression  Review of Systems:   General: no fevers, chills, no body weight gain, has poor appetite, has fatigue HEENT: no blurry vision, hearing changes or sore  throat Respiratory: has dyspnea, coughing, no wheezing CV: Has front chest discomfort,, no palpitations GI: no nausea, vomiting, abdominal pain, diarrhea, constipation GU: no dysuria, burning on urination, increased urinary frequency, hematuria  Ext: no leg edema Neuro: no unilateral weakness, numbness, or tingling, no vision change or hearing loss Skin: no rash, no skin tear. MSK: No muscle spasm, no deformity, no limitation of range of movement in spin Heme: No easy bruising.  Travel history: No recent long distant travel.   Allergy:  Allergies  Allergen Reactions  . Macrobid [Nitrofurantoin] Other (See Comments)    Stomach pain    Past Medical History:  Diagnosis Date  . Abnormal findings on diagnostic imaging of heart and coronary circulation   . Anemia    FROM BLEEDING ULCER  . Anxiety    takes Alprazolam daily as needed  . Arthritis    dx with RA 2017  . Bariatric surgery status   . Cataract   . Diverticulosis   . Gastro-esophageal reflux disease with esophagitis   . Gastrojejunal ulcer with hemorrhage   . Headache(784.0)    occasionally  . History of blood transfusion    no abnormal reaction noted  . History of bronchitis    last time many yrs ago  . Hyperlipidemia    PT DENIES THIS DX -  ON NO MEDS AND NO ONE HAS TOLD HER  . Hypertension    takes Amlodipine daily  . Hypothyroidism    takes Synthroid daily  . Insomnia   . Joint pain   . 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 Localized edema   . Nocturia   . Numbness    occasionally left arm at night  . Obesity   . Osteoporosis    takes Fosamax weekly  . Pain in joint involving pelvic region and thigh   . Peripheral edema    takes Lasix daily as needed  . PONV (postoperative nausea and vomiting)   . 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/2017  . Rheumatoid arthritis, unspecified (HCC)   . 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 Sleep apnea    Phreesia 08/22/2020  .  Stomach ulcer   . Thyroid disease    Phreesia 08/22/2020  . Unspecified injury of muscle(s) and tendon(s) of the rotator cuff of left shoulder, subsequent encounter   . Unspecified osteoarthritis, unspecified site   . Wears glasses   . Wears partial dentures     Past Surgical History:  Procedure Laterality Date  . ABDOMINAL HYSTERECTOMY     partial  . cataract surgery Bilateral   . CHOLECYSTECTOMY    . COLONOSCOPY    . ESOPHAGOGASTRODUODENOSCOPY (EGD) WITH PROPOFOL N/A 01/11/2022   Procedure: ESOPHAGOGASTRODUODENOSCOPY (EGD) WITH PROPOFOL;  Surgeon: Iva Boop, MD;  Location: [facility] ENDOSCOPY;  Service: Gastroenterology;  Laterality: N/A;  . EYE SURGERY     CATARACTS BOTH  . gastric bypass surgery    . HOT HEMOSTASIS N/A 01/11/2022   Procedure: HOT HEMOSTASIS (ARGON PLASMA COAGULATION/BICAP);  Surgeon: Iva Boop, MD;  Location: [facility] ENDOSCOPY;  Service: Gastroenterology;  Laterality: N/A;  . IR IMAGING GUIDED PORT INSERTION  05/23/2023  . JOINT REPLACEMENT Bilateral    hip  . REVERSE SHOULDER ARTHROPLASTY Right 05/11/2016   Procedure: REVERSE SHOULDER ARTHROPLASTY;  Surgeon: Teryl Lucy, MD;  Location: [facility] OR;  Service: Orthopedics;  Laterality: Right;  . REVISION TOTAL HIP ARTHROPLASTY Left 10/03/2013   DR Turner Daniels  . ROTATOR CUFF REPAIR    . SCLEROTHERAPY  01/11/2022   Procedure: SCLEROTHERAPY;  Surgeon: Iva Boop, MD;  Location: [facility] ENDOSCOPY;  Service: Gastroenterology;;  . TOTAL HIP REVISION Left 10/03/2013   Procedure: TOTAL HIP REVISION- left;  Surgeon: Nestor Lewandowsky, MD;  Location: [facility] OR;  Service: Orthopedics;  Laterality: Left;  . TOTAL KNEE ARTHROPLASTY Left 11/29/2017   Procedure: LEFT TOTAL KNEE ARTHROPLASTY;  Surgeon: Teryl Lucy, MD;  Location: [facility] OR;  Service: Orthopedics;  Laterality: Left;  . TOTAL SHOULDER ARTHROPLASTY Left 11/09/2016   Procedure: TOTAL REVERSE SHOULDER ARTHROPLASTY;  Surgeon: Teryl Lucy, MD;  Location: [facility] OR;  Service:  Orthopedics;  Laterality: Left;  . TOTAL SHOULDER REPLACEMENT Left 10/2016  . TOTAL THYROIDECTOMY    . UPPER GASTROINTESTINAL ENDOSCOPY      Social History:  reports that she has never smoked. She has never been exposed to tobacco smoke. She has never used smokeless tobacco. She reports that she does not drink alcohol and does not use drugs.  Family History:  Family History  Problem Relation Age of Onset  . Diabetes Mother   . Congestive Heart Failure Mother   . Lung cancer Father   . Thrombosis Sister   . CAD Brother        CABG  . Colon cancer Neg Hx   . Esophageal cancer Neg Hx   . Stomach cancer Neg Hx   . Rectal cancer Neg Hx      Prior to Admission medications   Medication Sig Start Date End Date Taking? Authorizing Provider  acetaminophen (TYLENOL) 500 MG tablet Take 1 tablet (500 mg total) by mouth every 6 (six) hours as needed for mild pain, headache or fever (or Fever >/= 101). 03/16/23   Shon Hale, MD  acyclovir (ZOVIRAX) 400 MG tablet Take 1 tablet (400 mg total) by mouth 2 (two) times daily. 04/26/23   Doreatha Massed, MD  Adalimumab (HUMIRA PEN) 40 MG/0.4ML PNKT Inject 40 mg into the skin every Saturday.    [provider]  aspirin EC 81 MG tablet Take 81 mg by mouth daily.    [provider]  calcium carbonate (TUMS - DOSED IN MG ELEMENTAL CALCIUM) 500 MG chewable tablet Chew 1 tablet (200 mg of elemental calcium total) by mouth 3 (three) times daily with meals. 06/19/23   Catarina Hartshorn, MD  ferrous sulfate 325 (65 FE) MG EC tablet Take 1 tablet (325 mg total) by mouth daily. 03/16/23   Shon Hale, MD  furosemide (LASIX) 20 MG tablet Take 20 mg by mouth daily.    [provider]  lenalidomide (REVLIMID) 10 MG capsule Take 1 capsule (10 mg total) by mouth daily. Take 10 mg by mouth for 21 days. Take 21 days on, 7 days off every 28 days 09/16/23   Doreatha Massed, MD  levothyroxine (SYNTHROID) 137 MCG tablet Take 0.5 tablets  (68.5 mcg total) by mouth daily at 6 (six) AM. Patient taking differently: Take 137 mcg by mouth daily at 6 (six) AM. 05/19/23   Marrion Coy, MD  magnesium oxide (MAG-OX) 400 (240 Mg) MG tablet Take 1 tablet (400 mg total) by mouth daily. 06/20/23   Catarina Hartshorn, MD  midodrine (PROAMATINE) 2.5 MG tablet Take 3 tablets (7.5 mg total) by mouth 3 (three) times daily with meals. 06/19/23   Catarina Hartshorn, MD  Multiple Vitamin (MULTIVITAMIN WITH MINERALS)  TABS tablet Take 1 tablet by mouth every morning.    [provider]  naloxone [facility]) nasal spray 4 mg/0.1 mL Place 1 spray into the nose as needed (opioid reversal).    [provider]  Omega-3 Fatty Acids (FISH OIL) 1000 MG CAPS Take 1 capsule by mouth daily. 03/17/23   [provider]  omeprazole (PRILOSEC) 40 MG capsule Take 1 capsule (40 mg total) by mouth daily before breakfast. Open capsule and swallow granules with liquid or applesauce 04/15/22   Iva Boop, MD  oxybutynin (DITROPAN-XL) 10 MG 24 hr tablet Take 10 mg by mouth at bedtime. 03/16/23   [provider]  polyethylene glycol (MIRALAX / GLYCOLAX) 17 g packet Take 17 g by mouth daily. 04/13/23   Sherryll Burger, Pratik D, DO  traMADol (ULTRAM) 50 MG tablet Take 1 tablet (50 mg total) by mouth every 8 (eight) hours as needed for moderate pain (pain score 4-6). 10/03/23   Lurene Shadow, MD    Physical Exam: Vitals:   10/19/23 1741 10/19/23 1904 10/19/23 2311  BP: 106/68    Pulse: (!) 102    Resp: 18    Temp: 99.6 F (37.6 C)  97.7 F (36.5 C)  TempSrc: Oral  Oral  SpO2: 100%    Weight:  75.7 kg   Height:  5\' 1"  (1.549 m)    General: Not in acute distress HEENT:       Eyes: PERRL, EOMI, no jaundice       ENT: No discharge from the ears and nose, no pharynx injection, no tonsillar enlargement.        Neck: No JVD, no bruit, no mass felt. Heme: No neck lymph node enlargement. Cardiac: S1/S2, RRR, No murmurs, No gallops or rubs. Respiratory: No rales,  wheezing, rhonchi or rubs. GI: Soft, nondistended, nontender, no rebound pain, no organomegaly, BS present. GU: No hematuria Ext: No pitting leg edema bilaterally. 1+DP/PT pulse bilaterally. Musculoskeletal: No joint deformities, No joint redness or warmth, no limitation of ROM in spin. Skin: No rashes.  Neuro: Alert, oriented X3, cranial nerves II-XII grossly intact, moves all extremities normally.  Psych: Patient is not psychotic, no suicidal or hemocidal ideation.  Labs on Admission: I have personally reviewed following labs and imaging studies  CBC: Recent Labs  Lab 10/19/23 1758  WBC 1.7*  NEUTROABS 0.5*  HGB 10.1*  HCT 29.5*  MCV 94.2  PLT 56*   Basic Metabolic Panel: Recent Labs  Lab 10/19/23 1758  NA 142  K <2.0*  CL 103  CO2 29  GLUCOSE 99  BUN 13  CREATININE 0.77  CALCIUM 6.2*  MG 1.7  PHOS <1.0*   GFR: Estimated Creatinine Clearance: 51.4 mL/min (by C-G formula based on SCr of 0.77 mg/dL). Liver Function Tests: Recent Labs  Lab 10/19/23 1758  AST 13*  ALT 14  ALKPHOS 101  BILITOT 1.7*  PROT 4.8*  ALBUMIN 2.0*   No results for input(s): "LIPASE", "AMYLASE" in the last 168 hours. No results for input(s): "AMMONIA" in the last 168 hours. Coagulation Profile: Recent Labs  Lab 10/19/23 1758  INR 1.5*   Cardiac Enzymes: No results for input(s): "CKTOTAL", "CKMB", "CKMBINDEX", "TROPONINI" in the last 168 hours. BNP (last 3 results) No results for input(s): "PROBNP" in the last 8760 hours. HbA1C: No results for input(s): "HGBA1C" in the last 72 hours. CBG: No results for input(s): "GLUCAP" in the last 168 hours. Lipid Profile: No results for input(s): "CHOL", "HDL", "LDLCALC", "TRIG", "CHOLHDL", "LDLDIRECT" in  the last 72 hours. Thyroid Function Tests: No results for input(s): "TSH", "T4TOTAL", "FREET4", "T3FREE", "THYROIDAB" in the last 72 hours. Anemia Panel: No results for input(s): "VITAMINB12", "FOLATE", "FERRITIN", "TIBC", "IRON",  "RETICCTPCT" in the last 72 hours. Urine analysis:    Component Value Date/Time   COLORURINE YELLOW (A) 09/30/2023 1334   APPEARANCEUR HAZY (A) 09/30/2023 1334   LABSPEC 1.006 09/30/2023 1334   PHURINE 6.0 09/30/2023 1334   GLUCOSEU NEGATIVE 09/30/2023 1334   HGBUR SMALL (A) 09/30/2023 1334   BILIRUBINUR NEGATIVE 09/30/2023 1334   KETONESUR NEGATIVE 09/30/2023 1334   PROTEINUR NEGATIVE 09/30/2023 1334   UROBILINOGEN 0.2 09/26/2013 1042   NITRITE NEGATIVE 09/30/2023 1334   LEUKOCYTESUR SMALL (A) 09/30/2023 1334   Sepsis Labs: @LABRCNTIP (procalcitonin:4,lacticidven:4) ) Recent Results (from the past 240 hours)  Resp panel by RT-PCR (RSV, Flu A&B, Covid) Anterior Nasal Swab     Status: Abnormal   Collection Time: 10/19/23  5:57 PM   Specimen: Anterior Nasal Swab  Result Value Ref Range Status   SARS Coronavirus 2 by RT PCR NEGATIVE NEGATIVE Final    Comment: (NOTE) SARS-CoV-2 target nucleic acids are NOT DETECTED.  The SARS-CoV-2 RNA is generally detectable in upper respiratory specimens during the acute phase of infection. The lowest concentration of SARS-CoV-2 viral copies this assay can detect is 138 copies/mL. A negative result does not preclude SARS-Cov-2 infection and should not be used as the sole basis for treatment or other patient management decisions. A negative result may occur with  improper specimen collection/handling, submission of specimen other than nasopharyngeal swab, presence of viral mutation(s) within the areas targeted by this assay, and inadequate number of viral copies(<138 copies/mL). A negative result must be combined with clinical observations, patient history, and epidemiological information. The expected result is Negative.  Fact Sheet for Patients:  BloggerCourse.com  Fact Sheet for Healthcare Providers:  SeriousBroker.it  This test is no t yet approved or cleared by the [place] FDA and   has been authorized for detection and/or diagnosis of SARS-CoV-2 by FDA under an Emergency Use Authorization (EUA). This EUA will remain  in effect (meaning this test can be used) for the duration of the COVID-19 declaration under Section 564(b)(1) of the Act, 21 U.S.C.section 360bbb-3(b)(1), unless the authorization is terminated  or revoked sooner.       Influenza A by PCR POSITIVE (A) NEGATIVE Final   Influenza B by PCR NEGATIVE NEGATIVE Final    Comment: (NOTE) The Xpert Xpress SARS-CoV-2/FLU/RSV plus assay is intended as an aid in the diagnosis of influenza from Nasopharyngeal swab specimens and should not be used as a sole basis for treatment. Nasal washings and aspirates are unacceptable for Xpert Xpress SARS-CoV-2/FLU/RSV testing.  Fact Sheet for Patients: BloggerCourse.com  Fact Sheet for Healthcare Providers: SeriousBroker.it  This test is not yet approved or cleared by the [place] FDA and has been authorized for detection and/or diagnosis of SARS-CoV-2 by FDA under an Emergency Use Authorization (EUA). This EUA will remain in effect (meaning this test can be used) for the duration of the COVID-19 declaration under Section 564(b)(1) of the Act, 21 U.S.C. section 360bbb-3(b)(1), unless the authorization is terminated or revoked.     Resp Syncytial Virus by PCR NEGATIVE NEGATIVE Final    Comment: (NOTE) Fact Sheet for Patients: BloggerCourse.com  Fact Sheet for Healthcare Providers: SeriousBroker.it  This test is not yet approved or cleared by the [place] FDA and has been authorized for detection and/or diagnosis of SARS-CoV-2 by FDA  under an Emergency Use Authorization (EUA). This EUA will remain in effect (meaning this test can be used) for the duration of the COVID-19 declaration under Section 564(b)(1) of the Act, 21 U.S.C. section 360bbb-3(b)(1),  unless the authorization is terminated or revoked.  Performed at [facility], [address] [place], [place] [address]      Radiological Exams on Admission:   Assessment/Plan Principal Problem:   Acute pulmonary embolism (HCC) Active Problems:   DVT (deep venous thrombosis) (HCC)   Influenza A   Hypokalemia   Hypomagnesemia   Electrolyte disturbance   Hypocalcemia   Hypophosphatemia   Pericardial effusion   Pancytopenia (HCC)   Acquired hypothyroidism   Hypotension   Multiple myeloma (HCC)   Myocardial injury   Compression fracture of L2 (HCC)   Compression fracture of T5 vertebra (HCC)   Fall at home, initial encounter   Assessment and Plan:   Acute pulmonary embolism (HCC): pt has acute PE with CT evidence of right heart strain.  Patient has increased oxygen requirement from 2 L to 4 L.  -admit to stepdown as pt -heparin drip initiated -2D echocardiogram ordered -prn albuterol nebs and mucinex  -consulted Dr. Wyn Quaker of [facility]  DVT (deep venous thrombosis) (HCC) -on IV Heparin  Influenza A -Bronchodilators, as needed Mucinex -Tamiflu 30 mg twice daily  Electrolytes disturbance: Hypokalemia with K< 2.0, hypomagnesemia with Mg 1.7, hypocalcemia with corrected Ca 7.8 and hypophosphatemia with phosphorus<1.0 -Repleted all -Consulted pharmacist for electrolytes monitoring  Pericardial effusion: CTA showed moderate to large pericardial effusion.  Her blood pressure is soft, which is chronic issue. -IV fluid: 500 cc LR, 500 cc normal saline, then 75 cc/h -Consulted cardiology, Dr. Gasper Lloyd  Pancytopenia [facility]): Likely due to chemotherapy -f/u by CBC  Acquired hypothyroidism -Synthroid  History of hypotension -Continue midodrine -On IV fluid as above    Multiple myeloma (HCC)   Myocardial injury   Compression fracture of L2 (HCC)   Compression fracture of T5 vertebra (HCC)   Fall at home, initial encounter        DVT ppx: on IV Heparin     Code Status: DNR (pt had DNR for with her)   Family Communication:     not done, no family member is at bed side.     Disposition Plan:  Anticipate discharge back to previous environment  Consults called: Dr. Wyn Quaker of [facility] and Dr. Gasper Lloyd of card  Admission status and Level of care: Stepdown:  as inpt        Dispo: The patient is from: SNF              Anticipated d/c is to: SNF              Anticipated d/c date is: 2 days              Patient currently is not medically stable to d/c.    Severity of Illness:  The appropriate patient status for this patient is INPATIENT. Inpatient status is judged to be reasonable and necessary in order to provide the required intensity of service to ensure the patient's safety. The patient's presenting symptoms, physical exam findings, and initial radiographic and laboratory data in the context of their chronic comorbidities is felt to place them at high risk for further clinical deterioration. Furthermore, it is not anticipated that the patient will be medically stable for discharge from the hospital within 2 midnights of admission.   * I certify that at the point  of admission it is my clinical judgment that the patient will require inpatient hospital care spanning beyond 2 midnights from the point of admission due to high intensity of service, high risk for further deterioration and high frequency of surveillance required.*       Date of Service 10/19/2023    Lorretta Harp [facility]   If 7PM-7AM, please contact night-coverage [email or web address] 10/19/2023, 11:46 PM

## 2023-10-19 NOTE — Progress Notes (Signed)
   10/19/23 1930  Spiritual Encounters  Type of Visit Initial  Care provided to: Family  Reason for visit Routine spiritual support  OnCall Visit Yes   Chaplain escorted patient friends to the lobby and brought one back to the room while patient was being rolled away for a procedure.  Chaplain offered compassionate presence to waiting friend but Lunette Stands was not needed.  Rev. Rana M. Earlene Plater, M.Div. Chaplain Resident [facility]

## 2023-10-19 NOTE — Progress Notes (Signed)
 PHARMACY - ANTICOAGULATION CONSULT NOTE  Pharmacy Consult for heparin Indication: pulmonary embolus and DVT  Allergies  Allergen Reactions   Macrobid [Nitrofurantoin] Other (See Comments)    Stomach pain    Patient Measurements: Height: 5\' 1"  (154.9 cm) Weight: 75.7 kg (166 lb 14.2 oz) IBW/kg (Calculated) : 47.8 Heparin Dosing Weight: 64.5 kg  Vital Signs: Temp: 99.6 F (37.6 C) (03/19 1741) Temp Source: Oral (03/19 1741) BP: 106/68 (03/19 1741) Pulse Rate: 102 (03/19 1741)  Labs: Recent Labs    10/19/23 1758  HGB 10.1*  HCT 29.5*  PLT 56*  CREATININE 0.77  TROPONINIHS 62*    Estimated Creatinine Clearance: 51.4 mL/min (by C-G formula based on SCr of 0.77 mg/dL).   Medical History: Past Medical History:  Diagnosis Date   Abnormal findings on diagnostic imaging of heart and coronary circulation    Anemia    FROM BLEEDING ULCER   Anxiety    takes Alprazolam daily as needed   Arthritis    dx with RA 2017   Bariatric surgery status    Cataract    Diverticulosis    Gastro-esophageal reflux disease with esophagitis    Gastrojejunal ulcer with hemorrhage    Headache(784.0)    occasionally   History of blood transfusion    no abnormal reaction noted   History of bronchitis    last time many yrs ago   Hyperlipidemia    PT DENIES THIS DX -  ON NO MEDS AND NO ONE HAS TOLD HER   Hypertension    takes Amlodipine daily   Hypothyroidism    takes Synthroid daily   Insomnia    Joint pain    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   Localized edema    Nocturia    Numbness    occasionally left arm at night   Obesity    Osteoporosis    takes Fosamax weekly   Pain in joint involving pelvic region and thigh    Peripheral edema    takes Lasix daily as needed   PONV (postoperative nausea and vomiting)    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/2017   Rheumatoid arthritis, unspecified (HCC)    Rotator cuff arthropathy, 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 PMH significant for  multiple myeloma, CKD, RA, chronic hypotension on midodrine, OSA, hypertension, hyperlipidemia, hypothyroidism. Imaging shows DVT in right femoral vein as well as submassive PE. Pharmacy has been consulted to initiate heparin infusion. Per chart review, patient was not on anticoagulation prior to admission.  Baseline labs: hgb 10.1, plt 56, INR pending  Platelets low at BL - discussed benefits outweigh risks with MD, will proceed with heparin infusion as patient has submassive PE and R DVT.  Goal of Therapy:  Heparin level 0.3-0.7 units/ml Monitor platelets by anticoagulation protocol: Yes   Plan:  Give 3900 units bolus x 1 Start heparin infusion at 1100 units/hr Check anti-Xa level in 8 hours and daily while on heparin Continue to monitor H&H and platelets  Thank you for involving pharmacy in this patient's care.   Rockwell Alexandria, PharmD Clinical Pharmacist 10/19/2023 8:02 PM

## 2023-10-19 NOTE — ED Provider Notes (Signed)
 [facility] Provider Note    Event Date/Time   First MD Initiated Contact with Patient 10/19/23 1736     (approximate)   History   Weakness   HPI  Kathy [name] is a 83 y.o. female patient with history of multiple myeloma, CKD, RA, chronic hypotension on midodrine, OSA, hypertension, hyperlipidemia, hypothyroidism, presenting from her facility because she was found to be hypokalemic.  Patient states that she has been feeling weak and lightheaded.  Had a fall.  Reports some lower back pain.  No new weakness or numbness.  No headache or chest pain or shortness of breath.  No vision changes.  No abdominal pain, nausea, vomiting, diarrhea.  Per EMS, her potassium was found to be 1.9 at facility.  States that she is on chemo and has a port.  Also noted that she had a wet cough.  Independent history obtained from EMS.  On independent chart review, she was admitted in early March for hypotension, found to be hypocalcemic.  Was given repletion with improvement and discharged.     Physical Exam   Triage Vital Signs: ED Triage Vitals [10/19/23 1738]  Encounter Vitals Group     BP      Systolic BP Percentile      Diastolic BP Percentile      Pulse      Resp      Temp      Temp src      SpO2      Weight      Height      Head Circumference      Peak Flow      Pain Score 6     Pain Loc      Pain Education      Exclude from Growth Chart     Most recent vital signs: Vitals:   10/19/23 1741  BP: 106/68  Pulse: (!) 102  Resp: 18  Temp: 99.6 F (37.6 C)  SpO2: 100%     General: Awake, no distress.  CV:  Good peripheral perfusion.  Resp:  Normal effort.  No increased work of breathing Abd:  No distention.  Soft nontender Other:  No palpable skull deformities or tenderness, no midline spinal tenderness, no tenderness to her extremities, able to range her upper extremities without any difficulty, no focal weakness or numbness.  Patient states  that she is unable to bend her left knee but that is normal for her due to stiffness.  No tenderness to palpation to her lower extremities, her right lower extremity is slightly larger than the left, she is bilateral edema to her ankles.  She is a poor in place on the right without any overlying erythema or fluctuance or induration.   ED Results / Procedures / Treatments   Labs (all labs ordered are listed, but only abnormal results are displayed) Labs Reviewed  RESP PANEL BY RT-PCR (RSV, FLU A&B, COVID)  RVPGX2 - Abnormal; Notable for the following components:      Result Value   Influenza A by PCR POSITIVE (*)    All other components within normal limits  BRAIN NATRIURETIC PEPTIDE - Abnormal; Notable for the following components:   B Natriuretic Peptide 713.1 (*)    All other components within normal limits  COMPREHENSIVE METABOLIC PANEL - Abnormal; Notable for the following components:   Potassium <2.0 (*)    Calcium 6.2 (*)    Total Protein 4.8 (*)    Albumin 2.0 (*)  AST 13 (*)    Total Bilirubin 1.7 (*)    All other components within normal limits  CBC WITH DIFFERENTIAL/PLATELET - Abnormal; Notable for the following components:   WBC 1.7 (*)    RBC 3.13 (*)    Hemoglobin 10.1 (*)    HCT 29.5 (*)    RDW 18.2 (*)    Platelets 56 (*)    nRBC 1.2 (*)    Neutro Abs 0.5 (*)    All other components within normal limits  PHOSPHORUS - Abnormal; Notable for the following components:   Phosphorus <1.0 (*)    All other components within normal limits  TROPONIN I (HIGH SENSITIVITY) - Abnormal; Notable for the following components:   Troponin I (High Sensitivity) 62 (*)    All other components within normal limits  MAGNESIUM  URINALYSIS, W/ REFLEX TO CULTURE (INFECTION SUSPECTED)  BASIC METABOLIC PANEL  MAGNESIUM  PHOSPHORUS  HEPARIN LEVEL (UNFRACTIONATED)  PROTIME-INR  CBC  APTT  TYPE AND SCREEN  TROPONIN I (HIGH SENSITIVITY)     EKG  EKG shows sinus tachycardia,  rate 113, widened QRS, prolonged QTc, right bundle branch block, T wave inversion to septal leads, no obvious ischemic ST elevation, is a change compared to prior   RADIOLOGY Chest x-ray on my interpretation without obvious consolidation.   PROCEDURES:  Critical Care performed: Yes, see critical care procedure note(s)  .Critical Care  Performed by: Claybon Jabs, MD Authorized by: Claybon Jabs, MD   Critical care provider statement:    Critical care time (minutes):  40   Critical care was necessary to treat or prevent imminent or life-threatening deterioration of the following conditions:  Circulatory failure and metabolic crisis   Critical care was time spent personally by me on the following activities:  Development of treatment plan with patient or surrogate, discussions with consultants, evaluation of patient's response to treatment, examination of patient, ordering and review of laboratory studies, ordering and review of radiographic studies, ordering and performing treatments and interventions, pulse oximetry, re-evaluation of patient's condition and review of old charts    MEDICATIONS ORDERED IN ED: Medications  potassium chloride 10 mEq in 100 mL IVPB (10 mEq Intravenous New Bag/Given 10/19/23 2059)  potassium PHOSPHATE 30 mmol in dextrose 5 % 500 mL infusion (has no administration in time range)  heparin ADULT infusion 100 units/mL (25000 units/293mL) (has no administration in time range)  heparin bolus via infusion 3,900 Units (has no administration in time range)  lactated ringers bolus 500 mL (has no administration in time range)  oseltamivir (TAMIFLU) capsule 30 mg (has no administration in time range)  ipratropium-albuterol (DUONEB) 0.5-2.5 (3) MG/3ML nebulizer solution 3 mL (has no administration in time range)  albuterol (PROVENTIL) (2.5 MG/3ML) 0.083% nebulizer solution 2.5 mg (has no administration in time range)  dextromethorphan-guaiFENesin (MUCINEX DM) 30-600 MG per  12 hr tablet 1 tablet (has no administration in time range)  diphenhydrAMINE (BENADRYL) injection 12.5 mg (has no administration in time range)  calcium gluconate 1 g/ 50 mL sodium chloride IVPB (has no administration in time range)  acetaminophen (TYLENOL) tablet 650 mg (has no administration in time range)  magnesium sulfate IVPB 2 g 50 mL (0 g Intravenous Stopped 10/19/23 2059)  iohexol (OMNIPAQUE) 350 MG/ML injection 75 mL (75 mLs Intravenous Contrast Given 10/19/23 1937)     IMPRESSION / MDM / ASSESSMENT AND PLAN / ED COURSE  I reviewed the triage vital signs and the nursing notes.  Differential diagnosis includes, but is not limited to, chemo side effect, electrolyte derangements, arrhythmia, UTI, pneumonia, viral illness, dehydration, for the fall, will get a CT head and neck to make sure there are no traumatic injuries, no midline spinal tenderness, no bony tenderness elsewhere to warrant imaging in those areas, right lower extremity is larger than left, will get a DVT ultrasound.  Also considered PE given her weakness, lightheadedness, cancer history, will get a CT PE study. will get labs, EKG, troponin, chest x-ray, UA.  Cardiac monitoring.  Patient's presentation is most consistent with acute presentation with potential threat to life or bodily function.  Independent review of labs imaging are below.  Consulted pharmacy for assistance with electrolyte replacement orders.  Independent review of labs imaging are below.  Bedside ultrasound was done, showed moderate pericardial effusion, no evidence of right atrial systolic collapse, no evidence of right ventricular diastolic collapse.  Patient's repeat blood pressure was in the mid 90s, will give her some IV fluids.  Discussed results with patient's family and they are all updated with the plan.  Also consulted vascular surgery who recommended heparin and evaluate her tomorrow for possible thrombectomy.  Consult  hospitalist was agreeable plan for admission will evaluate the patient.  She is admitted.  Clinical Course as of 10/19/23 2107  Wed Oct 19, 2023  1841 DG Chest Portable 1 View No acute cardiopulmonary abnormality.  [TT]  1853 CT Head Wo Contrast IMPRESSION: 1. No acute intracranial abnormality. 2. No acute osseous injury of the cervical spine. 3. Multiple lytic lesions throughout the bones, compatible with patient's known history of multiple myeloma. No pathological fracture. 4. Multiple other observations, as described above.   [TT]  1906 Independent review of labs, her potassium is less than 2, calcium is 6.2, corrected is 7.8.  Phosphorus is also less than 1, she is flu a positive.  Troponin is mildly elevated.  Will repeat her electrolytes. [TT]  1950 US Venous Img Lower Bilateral (DVT) IMPRESSION: In the right lower extremity, nonocclusive thrombus noted in the right common femoral vein, femoral vein and popliteal vein.  In the left lower extremity, nonocclusive thrombus noted in the left common femoral vein, femoral vein, popliteal vein, and greater saphenous vein.   [TT]  1953 Received call from radiology about patient's CT, she has some massive PE, elevated RV, also pericardial effusion, they cannot tell if it is tamponade physiology due to her RV dilation.  Also with some possible subacute thoracic spine fractures as well as sternal fracture. [TT]  2022 CT Angio Chest PE W/Cm &/Or Wo Cm IMPRESSION: 1. Acute pulmonary embolus in the right and left pulmonary arteries and in their lobar and segmental branches. Clot burden is high. Positive for acute PE with CT evidence of right heart strain (RV/LV Ratio = 1.45) consistent with at least submassive (intermediate risk) PE. The presence of right heart strain has been associated with an increased risk of morbidity and mortality. Please refer to the "Code PE Focused" order set in EPIC. 2. New moderate to large pericardial  effusion compared to 03/11/2023 likewise not present on the CT abdomen from 05/11/2023. Tamponade not excluded, and the picture may be difficult to sort on physical exam due to the pulmonary embolus with right heart dysfunction. If clinically warranted, echocardiography could be utilized for further characterization. 3. Hazy peripheral ground-glass opacities in both lungs nonspecific in could be a manifestation of pulmonary hemorrhage, edema, atypical pneumonia, or acute hypersensitivity pneumonitis. 4. Substantial airway plugging in both  lower lobes with atelectasis in both lower lobes. 5. Mildly dilated and thick-walled mid and distal thoracic esophagus. Air fluid level in the esophagus, possibly from dysmotility or reflux. 6. New anterior wedge compression fracture at T5. New superior endplate compression fracture at L2. Partially healed new fractures of the sternal body and manubrium. 7.  Aortic Atherosclerosis (ICD10-I70.0).  Critical Value/emergent results were called by telephone at the time of interpretation on 10/19/2023 at 7:54 pm to provider Bing Neighbors , who verbally acknowledged these results.   [TT]    Clinical Course User Index [TT] Jodie Echevaria Franchot Erichsen, MD     FINAL CLINICAL IMPRESSION(S) / ED DIAGNOSES   Final diagnoses:  Influenza A  Weakness  Hypokalemia  Fall, initial encounter  History of multiple myeloma  Acute low back pain, unspecified back pain laterality, unspecified whether sciatica present  Prolonged QT interval  Acute deep vein thrombosis (DVT) of proximal vein of lower extremity, unspecified laterality (HCC)  Hypophosphatemia  Other acute pulmonary embolism, unspecified whether acute cor pulmonale present (HCC)  Pericardial effusion     Rx / DC Orders   ED Discharge Orders     None        Note:  This document was prepared using Dragon voice recognition software and may include unintentional dictation errors.    Claybon Jabs, MD 10/19/23  2107

## 2023-10-19 NOTE — ED Triage Notes (Signed)
 [facility] reports pt coming from [facility] for low potassium and weakness. Pt potassium was 1.9. Pt is a cancer pt as well.

## 2023-10-19 NOTE — H&P (Signed)
 History and Physical    Kathy [name] ZOX:[id number] DOB: 1941-02-03 DOA: 10/19/2023  Referring MD/NP/PA:   PCP: [name], MD   Patient coming from:  The patient is coming from SNF   Chief Complaint: cough, SOB, lightheaded, and abnormal lab with hypokalemia  HPI: Kathy [name] is a 83 y.o. female with medical history significant of multiple myeloma on chemotherapy, hypotension on midodrine, HLD, hypothyroidism, anxiety, CKD stage IIIb, rheumatoid arthritis, stomach ulcer, pancytopenia, who presents with cough, SOB, lightheadedness and abnormal lab with hypokalemia.  Per report, pt was found to have hypokalemia with a potassium of 1.9 in facility.  Patient states that she has dry cough, mild SOB, and lightheadedness.  She has mild fluid chest discomfort.  No fever or chills.  No sore throat.  Patient does not have nausea, vomiting, diarrhea or abdominal pain.  Denies symptoms of UTI.  No rectal bleeding.  No dark stool.  She has generalized weakness.  She had fall, no significant injury.  No loss of consciousness.  No unilateral numbness or tingling in extremities. She has some lower back pain.     Data reviewed independently and ED Course: pt was found to have pancytopenia with WBC 1.7, hemoglobin 10.1, platelets 56 (patient had WBC 2.5, hemoglobin 10.6, platelets are 106 on 10/06/2023), positive PCR for flu A, troponin 62, electrolytes disturbance including hypokalemia with potassium<2.0, magnesium 1.7, phosphorus<1.0, calcium 6.2 which is a corrected to 7.8. GFR> 60, BNP 713.  Blood pressure 106/68, 89/62 (MAP 70), heart rate of 102, RR 18, oxygen saturation 100% on 42 oxygen (pt is on 2L of oxygen at baseline per patient).  Chest x-ray negative.  CT of head and C-spine negative for acute injury, but showed lytic none lesions. Pt is admitted to SDU as inpt.    CTA: IMPRESSION: 1. Acute pulmonary embolus in the right and left pulmonary arteries and in their lobar and  segmental branches. Clot burden is high. Positive for acute PE with CT evidence of right heart strain (RV/LV Ratio = 1.45) consistent with at least submassive (intermediate risk) PE. The presence of right heart strain has been associated with an increased risk of morbidity and mortality. Please refer to the "Code PE Focused" order set in EPIC. 2. New moderate to large pericardial effusion compared to 03/11/2023 likewise not present on the CT abdomen from 05/11/2023. Tamponade not excluded, and the picture may be difficult to sort on physical exam due to the pulmonary embolus with right heart dysfunction. If clinically warranted, echocardiography could be utilized for further characterization. 3. Hazy peripheral ground-glass opacities in both lungs nonspecific in could be a manifestation of pulmonary hemorrhage, edema, atypical pneumonia, or acute hypersensitivity pneumonitis. 4. Substantial airway plugging in both lower lobes with atelectasis in both lower lobes. 5. Mildly dilated and thick-walled mid and distal thoracic esophagus. Air fluid level in the esophagus, possibly from dysmotility or reflux. 6. New anterior wedge compression fracture at T5. New superior endplate compression fracture at L2. Partially healed new fractures of the sternal body and manubrium. 7.  Aortic Atherosclerosis (ICD10-I70.0).   Lower extremity venous Doppler: In the right lower extremity, nonocclusive thrombus noted in the right common femoral vein, femoral vein and popliteal vein.   In the left lower extremity, nonocclusive thrombus noted in the left common femoral vein, femoral vein, popliteal vein, and greater saphenous vein.     EKG: I have personally reviewed.  Sinus rhythm, QTc 535, low voltage, bifascicular block, early R wave progression  Review of Systems:   General: no fevers, chills, no body weight gain, has poor appetite, has fatigue HEENT: no blurry vision, hearing changes or sore  throat Respiratory: has dyspnea, coughing, no wheezing CV: Has front chest discomfort,, no palpitations GI: no nausea, vomiting, abdominal pain, diarrhea, constipation GU: no dysuria, burning on urination, increased urinary frequency, hematuria  Ext: no leg edema Neuro: no unilateral weakness, numbness, or tingling, no vision change or hearing loss Skin: no rash, no skin tear. MSK: No muscle spasm, no deformity, no limitation of range of movement in spin Heme: No easy bruising.  Travel history: No recent long distant travel.   Allergy:  Allergies  Allergen Reactions   Macrobid [Nitrofurantoin] Other (See Comments)    Stomach pain    Past Medical History:  Diagnosis Date   Abnormal findings on diagnostic imaging of heart and coronary circulation    Anemia    FROM BLEEDING ULCER   Anxiety    takes Alprazolam daily as needed   Arthritis    dx with RA 2017   Bariatric surgery status    Cataract    Diverticulosis    Gastro-esophageal reflux disease with esophagitis    Gastrojejunal ulcer with hemorrhage    Headache(784.0)    occasionally   History of blood transfusion    no abnormal reaction noted   History of bronchitis    last time many yrs ago   Hyperlipidemia    PT DENIES THIS DX -  ON NO MEDS AND NO ONE HAS TOLD HER   Hypertension    takes Amlodipine daily   Hypothyroidism    takes Synthroid daily   Insomnia    Joint pain    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   Localized edema    Nocturia    Numbness    occasionally left arm at night   Obesity    Osteoporosis    takes Fosamax weekly   Pain in joint involving pelvic region and thigh    Peripheral edema    takes Lasix daily as needed   PONV (postoperative nausea and vomiting)    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/2017   Rheumatoid arthritis, unspecified (HCC)    Rotator cuff arthropathy, 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   Sleep apnea    Phreesia 08/22/2020   Stomach ulcer    Thyroid disease     Phreesia 08/22/2020   Unspecified injury of muscle(s) and tendon(s) of the rotator cuff of left shoulder, subsequent encounter    Unspecified osteoarthritis, unspecified site    Wears glasses    Wears partial dentures     Past Surgical History:  Procedure Laterality Date   ABDOMINAL HYSTERECTOMY     partial   cataract surgery Bilateral    CHOLECYSTECTOMY     COLONOSCOPY     ESOPHAGOGASTRODUODENOSCOPY (EGD) WITH PROPOFOL N/A 01/11/2022   Procedure: ESOPHAGOGASTRODUODENOSCOPY (EGD) WITH PROPOFOL;  Surgeon: Iva Boop, MD;  Location: [facility] ENDOSCOPY;  Service: Gastroenterology;  Laterality: N/A;   EYE SURGERY     CATARACTS BOTH   gastric bypass surgery     HOT HEMOSTASIS N/A 01/11/2022   Procedure: HOT HEMOSTASIS (ARGON PLASMA COAGULATION/BICAP);  Surgeon: Iva Boop, MD;  Location: [facility] ENDOSCOPY;  Service: Gastroenterology;  Laterality: N/A;   IR IMAGING GUIDED PORT INSERTION  05/23/2023   JOINT REPLACEMENT Bilateral    hip   REVERSE SHOULDER ARTHROPLASTY Right 05/11/2016   Procedure: REVERSE SHOULDER ARTHROPLASTY;  Surgeon: Teryl Lucy, MD;  Location: [facility] OR;  Service: Orthopedics;  Laterality: Right;   REVISION TOTAL HIP ARTHROPLASTY Left 10/03/2013   DR Turner Daniels   ROTATOR CUFF REPAIR     SCLEROTHERAPY  01/11/2022   Procedure: Susa Day;  Surgeon: Iva Boop, MD;  Location: [facility] END[facility]COPY;  Service: Gastroenterology;;   TOTAL HIP REVISION Left 10/03/2013   Procedure: TOTAL HIP REVISION- left;  Surgeon: Nestor Lewandowsky, MD;  Location: [facility] OR;  Service: Orthopedics;  Laterality: Left;   TOTAL KNEE ARTHROPLASTY Left 11/29/2017   Procedure: LEFT TOTAL KNEE ARTHROPLASTY;  Surgeon: Teryl Lucy, MD;  Location: [facility] OR;  Service: Orthopedics;  Laterality: Left;   TOTAL SHOULDER ARTHROPLASTY Left 11/09/2016   Procedure: TOTAL REVERSE SHOULDER ARTHROPLASTY;  Surgeon: Teryl Lucy, MD;  Location: [facility] OR;  Service: Orthopedics;  Laterality: Left;   TOTAL SHOULDER REPLACEMENT Left  10/2016   TOTAL THYROIDECTOMY     UPPER GASTROINTESTINAL ENDOSCOPY      Social History:  reports that she has never smoked. She has never been exposed to tobacco smoke. She has never used smokeless tobacco. She reports that she does not drink alcohol and does not use drugs.  Family History:  Family History  Problem Relation Age of Onset   Diabetes Mother    Congestive Heart Failure Mother    Lung cancer Father    Thrombosis Sister    CAD Brother        CABG   Colon cancer Neg Hx    Esophageal cancer Neg Hx    Stomach cancer Neg Hx    Rectal cancer Neg Hx      Prior to Admission medications   Medication Sig Start Date End Date Taking? Authorizing Provider  acetaminophen (TYLENOL) 500 MG tablet Take 1 tablet (500 mg total) by mouth every 6 (six) hours as needed for mild pain, headache or fever (or Fever >/= 101). 03/16/23   Shon Hale, MD  acyclovir (ZOVIRAX) 400 MG tablet Take 1 tablet (400 mg total) by mouth 2 (two) times daily. 04/26/23   Doreatha Massed, MD  Adalimumab (HUMIRA PEN) 40 MG/0.4ML PNKT Inject 40 mg into the skin every Saturday.    [provider]  aspirin EC 81 MG tablet Take 81 mg by mouth daily.    [provider]  calcium carbonate (TUMS - DOSED IN MG ELEMENTAL CALCIUM) 500 MG chewable tablet Chew 1 tablet (200 mg of elemental calcium total) by mouth 3 (three) times daily with meals. 06/19/23   Catarina Hartshorn, MD  ferrous sulfate 325 (65 FE) MG EC tablet Take 1 tablet (325 mg total) by mouth daily. 03/16/23   Shon Hale, MD  furosemide (LASIX) 20 MG tablet Take 20 mg by mouth daily.    [provider]  lenalidomide (REVLIMID) 10 MG capsule Take 1 capsule (10 mg total) by mouth daily. Take 10 mg by mouth for 21 days. Take 21 days on, 7 days off every 28 days 09/16/23   Doreatha Massed, MD  levothyroxine (SYNTHROID) 137 MCG tablet Take 0.5 tablets (68.5 mcg total) by mouth daily at 6 (six) AM. Patient taking differently: Take  137 mcg by mouth daily at 6 (six) AM. 05/19/23   Marrion Coy, MD  magnesium oxide (MAG-OX) 400 (240 Mg) MG tablet Take 1 tablet (400 mg total) by mouth daily. 06/20/23   Catarina Hartshorn, MD  midodrine (PROAMATINE) 2.5 MG tablet Take 3 tablets (7.5 mg total) by mouth 3 (three) times daily with meals. 06/19/23   Catarina Hartshorn, MD  Multiple Vitamin (MULTIVITAMIN WITH MINERALS)  TABS tablet Take 1 tablet by mouth every morning.    [provider]  naloxone [facility]) nasal spray 4 mg/0.1 mL Place 1 spray into the nose as needed (opioid reversal).    [provider]  Omega-3 Fatty Acids (FISH OIL) 1000 MG CAPS Take 1 capsule by mouth daily. 03/17/23   [provider]  omeprazole (PRILOSEC) 40 MG capsule Take 1 capsule (40 mg total) by mouth daily before breakfast. Open capsule and swallow granules with liquid or applesauce 04/15/22   Iva Boop, MD  oxybutynin (DITROPAN-XL) 10 MG 24 hr tablet Take 10 mg by mouth at bedtime. 03/16/23   [provider]  polyethylene glycol (MIRALAX / GLYCOLAX) 17 g packet Take 17 g by mouth daily. 04/13/23   Sherryll Burger, Pratik D, DO  traMADol (ULTRAM) 50 MG tablet Take 1 tablet (50 mg total) by mouth every 8 (eight) hours as needed for moderate pain (pain score 4-6). 10/03/23   Lurene Shadow, MD    Physical Exam: Vitals:   10/19/23 1741 10/19/23 1904 10/19/23 2311  BP: 106/68    Pulse: (!) 102    Resp: 18    Temp: 99.6 F (37.6 C)  97.7 F (36.5 C)  TempSrc: Oral  Oral  SpO2: 100%    Weight:  75.7 kg   Height:  5\' 1"  (1.549 m)    General: Not in acute distress HEENT:       Eyes: PERRL, EOMI, no jaundice       ENT: No discharge from the ears and nose, no pharynx injection, no tonsillar enlargement.        Neck: No JVD, no bruit, no mass felt. Heme: No neck lymph node enlargement. Cardiac: S1/S2, RRR, No murmurs, No gallops or rubs. Respiratory: No rales, wheezing, rhonchi or rubs. GI: Soft, nondistended, nontender, no rebound pain, no  organomegaly, BS present. GU: No hematuria Ext: No pitting leg edema bilaterally. 1+DP/PT pulse bilaterally. Musculoskeletal: No joint deformities, No joint redness or warmth, no limitation of ROM in spin. Skin: No rashes.  Neuro: Alert, oriented X3, cranial nerves II-XII grossly intact, moves all extremities normally.  Psych: Patient is not psychotic, no suicidal or hemocidal ideation.  Labs on Admission: I have personally reviewed following labs and imaging studies  CBC: Recent Labs  Lab 10/19/23 1758  WBC 1.7*  NEUTROABS 0.5*  HGB 10.1*  HCT 29.5*  MCV 94.2  PLT 56*   Basic Metabolic Panel: Recent Labs  Lab 10/19/23 1758  NA 142  K <2.0*  CL 103  CO2 29  GLUCOSE 99  BUN 13  CREATININE 0.77  CALCIUM 6.2*  MG 1.7  PHOS <1.0*   GFR: Estimated Creatinine Clearance: 51.4 mL/min (by C-G formula based on SCr of 0.77 mg/dL). Liver Function Tests: Recent Labs  Lab 10/19/23 1758  AST 13*  ALT 14  ALKPHOS 101  BILITOT 1.7*  PROT 4.8*  ALBUMIN 2.0*   No results for input(s): "LIPASE", "AMYLASE" in the last 168 hours. No results for input(s): "AMMONIA" in the last 168 hours. Coagulation Profile: Recent Labs  Lab 10/19/23 1758  INR 1.5*   Cardiac Enzymes: No results for input(s): "CKTOTAL", "CKMB", "CKMBINDEX", "TROPONINI" in the last 168 hours. BNP (last 3 results) No results for input(s): "PROBNP" in the last 8760 hours. HbA1C: No results for input(s): "HGBA1C" in the last 72 hours. CBG: No results for input(s): "GLUCAP" in the last 168 hours. Lipid Profile: No results for input(s): "CHOL", "HDL", "LDLCALC", "TRIG", "CHOLHDL", "LDLDIRECT" in  the last 72 hours. Thyroid Function Tests: No results for input(s): "TSH", "T4TOTAL", "FREET4", "T3FREE", "THYROIDAB" in the last 72 hours. Anemia Panel: No results for input(s): "VITAMINB12", "FOLATE", "FERRITIN", "TIBC", "IRON", "RETICCTPCT" in the last 72 hours. Urine analysis:    Component Value Date/Time    COLORURINE YELLOW (A) 09/30/2023 1334   APPEARANCEUR HAZY (A) 09/30/2023 1334   LABSPEC 1.006 09/30/2023 1334   PHURINE 6.0 09/30/2023 1334   GLUCOSEU NEGATIVE 09/30/2023 1334   HGBUR SMALL (A) 09/30/2023 1334   BILIRUBINUR NEGATIVE 09/30/2023 1334   KETONESUR NEGATIVE 09/30/2023 1334   PROTEINUR NEGATIVE 09/30/2023 1334   UROBILINOGEN 0.2 09/26/2013 1042   NITRITE NEGATIVE 09/30/2023 1334   LEUKOCYTESUR SMALL (A) 09/30/2023 1334   Sepsis Labs: @LABRCNTIP (procalcitonin:4,lacticidven:4) ) Recent Results (from the past 240 hours)  Resp panel by RT-PCR (RSV, Flu A&B, Covid) Anterior Nasal Swab     Status: Abnormal   Collection Time: 10/19/23  5:57 PM   Specimen: Anterior Nasal Swab  Result Value Ref Range Status   SARS Coronavirus 2 by RT PCR NEGATIVE NEGATIVE Final    Comment: (NOTE) SARS-CoV-2 target nucleic acids are NOT DETECTED.  The SARS-CoV-2 RNA is generally detectable in upper respiratory specimens during the acute phase of infection. The lowest concentration of SARS-CoV-2 viral copies this assay can detect is 138 copies/mL. A negative result does not preclude SARS-Cov-2 infection and should not be used as the sole basis for treatment or other patient management decisions. A negative result may occur with  improper specimen collection/handling, submission of specimen other than nasopharyngeal swab, presence of viral mutation(s) within the areas targeted by this assay, and inadequate number of viral copies(<138 copies/mL). A negative result must be combined with clinical observations, patient history, and epidemiological information. The expected result is Negative.  Fact Sheet for Patients:  BloggerCourse.com  Fact Sheet for Healthcare Providers:  SeriousBroker.it  This test is no t yet approved or cleared by the [place] FDA and  has been authorized for detection and/or diagnosis of SARS-CoV-2 by FDA under an  Emergency Use Authorization (EUA). This EUA will remain  in effect (meaning this test can be used) for the duration of the COVID-19 declaration under Section 564(b)(1) of the Act, 21 U.S.C.section 360bbb-3(b)(1), unless the authorization is terminated  or revoked sooner.       Influenza A by PCR POSITIVE (A) NEGATIVE Final   Influenza B by PCR NEGATIVE NEGATIVE Final    Comment: (NOTE) The Xpert Xpress SARS-CoV-2/FLU/RSV plus assay is intended as an aid in the diagnosis of influenza from Nasopharyngeal swab specimens and should not be used as a sole basis for treatment. Nasal washings and aspirates are unacceptable for Xpert Xpress SARS-CoV-2/FLU/RSV testing.  Fact Sheet for Patients: BloggerCourse.com  Fact Sheet for Healthcare Providers: SeriousBroker.it  This test is not yet approved or cleared by the [place] FDA and has been authorized for detection and/or diagnosis of SARS-CoV-2 by FDA under an Emergency Use Authorization (EUA). This EUA will remain in effect (meaning this test can be used) for the duration of the COVID-19 declaration under Section 564(b)(1) of the Act, 21 U.S.C. section 360bbb-3(b)(1), unless the authorization is terminated or revoked.     Resp Syncytial Virus by PCR NEGATIVE NEGATIVE Final    Comment: (NOTE) Fact Sheet for Patients: BloggerCourse.com  Fact Sheet for Healthcare Providers: SeriousBroker.it  This test is not yet approved or cleared by the [place] FDA and has been authorized for detection and/or diagnosis of SARS-CoV-2 by FDA  under an Emergency Use Authorization (EUA). This EUA will remain in effect (meaning this test can be used) for the duration of the COVID-19 declaration under Section 564(b)(1) of the Act, 21 U.S.C. section 360bbb-3(b)(1), unless the authorization is terminated or revoked.  Performed at [facility], [address] [place], [place] [address]      Radiological Exams on Admission:   Assessment/Plan Principal Problem:   Acute pulmonary embolism (HCC) Active Problems:   DVT (deep venous thrombosis) (HCC)   Influenza A   Hypokalemia   Hypomagnesemia   Electrolyte disturbance   Hypocalcemia   Hypophosphatemia   Pericardial effusion   Pancytopenia (HCC)   Acquired hypothyroidism   Hypotension   Multiple myeloma (HCC)   Myocardial injury   Compression fracture of L2 (HCC)   Compression fracture of T5 vertebra (HCC)   Fall at home, initial encounter   Assessment and Plan:   Principal Problem:   Acute pulmonary embolism (HCC) Active Problems:   DVT (deep venous thrombosis) (HCC)   Influenza A   Hypokalemia   Hypomagnesemia   Electrolyte disturbance   Hypocalcemia   Hypophosphatemia   Pericardial effusion   Pancytopenia (HCC)   Acquired hypothyroidism   Hypotension   Multiple myeloma (HCC)   Myocardial injury   Compression fracture of L2 (HCC)   Compression fracture of T5 vertebra (HCC)   Fall at home, initial encounter        DVT ppx: on IV Heparin    Code Status: DNR (pt had DNR for with her)   Family Communication:     not done, no family member is at bed side.     Disposition Plan:  Anticipate discharge back to previous environment  Consults called: Dr. Wyn Quaker of [facility] and Dr. Gasper Lloyd of card  Admission status and Level of care: Stepdown:  as inpt        Dispo: The patient is from: SNF              Anticipated d/c is to: SNF              Anticipated d/c date is: 2 days              Patient currently is not medically stable to d/c.    Severity of Illness:  The appropriate patient status for this patient is INPATIENT. Inpatient status is judged to be reasonable and necessary in order to provide the required intensity of service to ensure the patient's safety. The patient's presenting symptoms, physical exam findings, and initial  radiographic and laboratory data in the context of their chronic comorbidities is felt to place them at high risk for further clinical deterioration. Furthermore, it is not anticipated that the patient will be medically stable for discharge from the hospital within 2 midnights of admission.   * I certify that at the point of admission it is my clinical judgment that the patient will require inpatient hospital care spanning beyond 2 midnights from the point of admission due to high intensity of service, high risk for further deterioration and high frequency of surveillance required.*       Date of Service 10/19/2023    Lorretta Harp [facility]   If 7PM-7AM, please contact night-coverage [email or web address] 10/19/2023, 11:46 PM

## 2023-10-20 ENCOUNTER — Inpatient Hospital Stay
Admit: 2023-10-20 | Discharge: 2023-10-20 | Disposition: A | Discharge status: Home / Self Care | Attending: Nurse Practitioner | Admitting: Nurse Practitioner

## 2023-10-20 ENCOUNTER — Inpatient Hospital Stay: Payer: 59

## 2023-10-20 ENCOUNTER — Other Ambulatory Visit: Payer: Self-pay | Admitting: Hematology

## 2023-10-20 ENCOUNTER — Inpatient Hospital Stay: Payer: 59 | Admitting: Hematology

## 2023-10-20 ENCOUNTER — Encounter: Payer: Self-pay | Admitting: Internal Medicine

## 2023-10-20 DIAGNOSIS — I824Y9 Acute embolism and thrombosis of unspecified deep veins of unspecified proximal lower extremity: Secondary | ICD-10-CM | POA: Diagnosis not present

## 2023-10-20 DIAGNOSIS — I2699 Other pulmonary embolism without acute cor pulmonale: Secondary | ICD-10-CM | POA: Diagnosis not present

## 2023-10-20 DIAGNOSIS — Z8579 Personal history of other malignant neoplasms of lymphoid, hematopoietic and related tissues: Secondary | ICD-10-CM

## 2023-10-20 DIAGNOSIS — I3139 Other pericardial effusion (noninflammatory): Secondary | ICD-10-CM | POA: Diagnosis not present

## 2023-10-20 DIAGNOSIS — M545 Low back pain, unspecified: Secondary | ICD-10-CM

## 2023-10-20 DIAGNOSIS — H269 Unspecified cataract: Secondary | ICD-10-CM | POA: Diagnosis not present

## 2023-10-20 DIAGNOSIS — I2609 Other pulmonary embolism with acute cor pulmonale: Secondary | ICD-10-CM

## 2023-10-20 DIAGNOSIS — C9000 Multiple myeloma not having achieved remission: Secondary | ICD-10-CM | POA: Diagnosis not present

## 2023-10-20 DIAGNOSIS — J101 Influenza due to other identified influenza virus with other respiratory manifestations: Secondary | ICD-10-CM | POA: Diagnosis not present

## 2023-10-20 LAB — BASIC METABOLIC PANEL
Anion gap: 9 (ref 5–15)
Anion gap: 9 (ref 5–15)
BUN: 13 mg/dL (ref 8–23)
BUN: 13 mg/dL (ref 8–23)
CO2: 26 mmol/L (ref 22–32)
CO2: 25 mmol/L (ref 22–32)
Calcium: 6.3 mg/dL — CL (ref 8.9–10.3)
Calcium: 5.8 mg/dL — CL (ref 8.9–10.3)
Chloride: 106 mmol/L (ref 98–111)
Chloride: 107 mmol/L (ref 98–111)
Creatinine, Ser: 0.6 mg/dL (ref 0.44–1.00)
Creatinine, Ser: 0.68 mg/dL (ref 0.44–1.00)
GFR, Estimated: >60 mL/min (ref >60)
GFR, Estimated: >60 mL/min (ref >60)
Glucose, Bld: 136 mg/dL — ABNORMAL HIGH (ref 70–99)
Glucose, Bld: 99 mg/dL (ref 70–99)
Potassium: 2.4 mmol/L — CL (ref 3.5–5.1)
Potassium: 3.4 mmol/L — ABNORMAL LOW (ref 3.5–5.1)
Sodium: 141 mmol/L (ref 135–145)
Sodium: 141 mmol/L (ref 135–145)

## 2023-10-20 LAB — ECHOCARDIOGRAM COMPLETE
AR max vel: 3.59 cm2
AV Area VTI: 2.93 cm2
AV Area mean vel: 3.84 cm2
AV Mean grad: 2 mmHg
AV Peak grad: 4.8 mmHg
Ao pk vel: 1.09 m/s
Area-P 1/2: 5.13 cm2
Height: 61 in
MV VTI: 2.41 cm2
S' Lateral: 2.4 cm
Weight: 2670.21 [oz_av]

## 2023-10-20 LAB — CBC
HCT: 25.3 % — ABNORMAL LOW (ref 36.0–46.0)
Hemoglobin: 8.7 g/dL — ABNORMAL LOW (ref 12.0–15.0)
MCH: 32.2 pg (ref 26.0–34.0)
MCHC: 34.4 g/dL (ref 30.0–36.0)
MCV: 93.7 fL (ref 80.0–100.0)
Platelets: 48 10*3/uL — ABNORMAL LOW (ref 150–400)
RBC: 2.7 MIL/uL — ABNORMAL LOW (ref 3.87–5.11)
RDW: 18 % — ABNORMAL HIGH (ref 11.5–15.5)
WBC: 1.2 10*3/uL — CL (ref 4.0–10.5)
nRBC: 0 % (ref 0.0–0.2)

## 2023-10-20 LAB — PHOSPHORUS
Phosphorus: 2.1 mg/dL — ABNORMAL LOW (ref 2.5–4.6)
Phosphorus: 2.2 mg/dL — ABNORMAL LOW (ref 2.5–4.6)

## 2023-10-20 LAB — MRSA NEXT GEN BY PCR, NASAL: MRSA by PCR Next Gen: NOT DETECTED

## 2023-10-20 LAB — PLATELET COUNT: Platelets: 53 10*3/uL — ABNORMAL LOW (ref 150–400)

## 2023-10-20 LAB — MAGNESIUM
Magnesium: 2.1 mg/dL (ref 1.7–2.4)
Magnesium: 1.9 mg/dL (ref 1.7–2.4)

## 2023-10-20 LAB — ALBUMIN: Albumin: 1.9 g/dL — ABNORMAL LOW (ref 3.5–5.0)

## 2023-10-20 LAB — TROPONIN I (HIGH SENSITIVITY): Troponin I (High Sensitivity): 50 ng/L — ABNORMAL HIGH (ref <18)

## 2023-10-20 LAB — GLUCOSE, CAPILLARY: Glucose-Capillary: 91 mg/dL (ref 70–99)

## 2023-10-20 LAB — HEPARIN LEVEL (UNFRACTIONATED)
Heparin Unfractionated: 0.44 [IU]/mL (ref 0.30–0.70)
Heparin Unfractionated: 0.35 [IU]/mL (ref 0.30–0.70)

## 2023-10-20 MED ORDER — CALCIUM GLUCONATE-NACL 1-0.675 GM/50ML-% IV SOLN
1.0000 g | Freq: Once | INTRAVENOUS | Status: AC
  Administered 2023-10-20: 1000 mg via INTRAVENOUS
  Filled 2023-10-20: qty 50

## 2023-10-20 MED ORDER — K PHOS MONO-SOD PHOS DI & MONO 155-852-130 MG PO TABS
500.0000 mg | ORAL_TABLET | Freq: Once | ORAL | Status: AC
  Administered 2023-10-20: 500 mg via ORAL
  Filled 2023-10-20: qty 2

## 2023-10-20 MED ORDER — POTASSIUM CHLORIDE 20 MEQ PO PACK
40.0000 meq | PACK | Freq: Once | ORAL | Status: AC
  Administered 2023-10-20: 40 meq via ORAL
  Filled 2023-10-20: qty 2

## 2023-10-20 MED ORDER — ALBUMIN HUMAN 25 % IV SOLN
25.0000 g | Freq: Four times a day (QID) | INTRAVENOUS | Status: AC
  Administered 2023-10-21: 25 g via INTRAVENOUS
  Administered 2023-10-21: 12.5 g via INTRAVENOUS
  Administered 2023-10-21: 25 g via INTRAVENOUS
  Filled 2023-10-20 (×3): qty 100

## 2023-10-20 MED ORDER — POTASSIUM CHLORIDE CRYS ER 20 MEQ PO TBCR
40.0000 meq | EXTENDED_RELEASE_TABLET | Freq: Once | ORAL | Status: AC
  Administered 2023-10-20: 40 meq via ORAL
  Filled 2023-10-20: qty 2

## 2023-10-20 MED ORDER — POTASSIUM CHLORIDE 10 MEQ/100ML IV SOLN
10.0000 meq | INTRAVENOUS | Status: AC
  Administered 2023-10-20 (×4): 10 meq via INTRAVENOUS
  Filled 2023-10-20 (×3): qty 100

## 2023-10-20 MED ORDER — LEVOTHYROXINE SODIUM 137 MCG PO TABS
68.5000 ug | ORAL_TABLET | Freq: Every day | ORAL | Status: DC
  Administered 2023-10-21 – 2023-10-23 (×3): 68.5 ug via ORAL
  Filled 2023-10-20 (×3): qty 0.5

## 2023-10-20 MED ORDER — CALCIUM CARBONATE 1250 (500 CA) MG PO TABS
1.0000 | ORAL_TABLET | Freq: Three times a day (TID) | ORAL | Status: DC
  Administered 2023-10-20 – 2023-10-21 (×5): 1250 mg via ORAL
  Filled 2023-10-20 (×8): qty 1

## 2023-10-20 MED ORDER — MAGNESIUM OXIDE -MG SUPPLEMENT 400 (240 MG) MG PO TABS
400.0000 mg | ORAL_TABLET | Freq: Once | ORAL | Status: AC
  Administered 2023-10-20: 400 mg via ORAL
  Filled 2023-10-20: qty 1

## 2023-10-20 MED ORDER — SODIUM CHLORIDE 0.9 % IV SOLN: INTRAVENOUS | Status: DC

## 2023-10-20 MED ORDER — CHLORHEXIDINE GLUCONATE CLOTH 2 % EX PADS
6.0000 | MEDICATED_PAD | Freq: Every day | CUTANEOUS | Status: DC
Start: 2023-10-20 — End: 2023-10-23
  Administered 2023-10-20 – 2023-10-23 (×4): 6 via TOPICAL

## 2023-10-20 MED ORDER — IPRATROPIUM-ALBUTEROL 0.5-2.5 (3) MG/3ML IN SOLN
3.0000 mL | Freq: Four times a day (QID) | RESPIRATORY_TRACT | Status: DC
  Administered 2023-10-21: 3 mL via RESPIRATORY_TRACT
  Filled 2023-10-20: qty 3

## 2023-10-20 NOTE — Progress Notes (Signed)
 PHARMACY CONSULT NOTE - ELECTROLYTES  Pharmacy Consult for Electrolyte Monitoring and Replacement   Recent Labs: Height: 5\' 1"  (154.9 cm) Weight: 75.7 kg (166 lb 14.2 oz) IBW/kg (Calculated) : 47.8 Estimated Creatinine Clearance: 51.4 mL/min (by C-G formula based on SCr of 0.6 mg/dL). Potassium (mmol/L)  Date Value  10/20/2023 2.4 (LL)   Magnesium (mg/dL)  Date Value  47/82/9562 2.1   Calcium (mg/dL)  Date Value  13/03/6577 6.3 (LL)   Albumin (g/dL)  Date Value  46/96/2952 2.0 (L)  03/18/2021 3.2 (L)   Phosphorus (mg/dL)  Date Value  84/13/2440 2.1 (L)   Sodium (mmol/L)  Date Value  10/20/2023 141  02/23/2023 135   Corrected Ca: 7.9 mg/dL  Assessment  Kathy [name] is a 83 y.o. female presenting with weakness. PMH significant for multiple myeloma, CKD, RA, chronic hypotension on midodrine, OSA, hypertension, hyperlipidemia, hypothyroidism. Pharmacy has been consulted to monitor and replace electrolytes.  Goal of Therapy: Electrolytes WNL  Plan:  K = 2.4, provider ordered 40 mEq po x 1.  Will order additional Kcl 10 mEq IV x 4 Phos 2.1, Kphos IV bag still running when lab was drawn.  Give Kphos Neutral 500 mg po x 1 Recheck K 3/20 @ 1800 Check BMP, Mg, Phos with AM labs  Thank you for allowing pharmacy to be a part of this patient's care.  Barrie Folk, PharmD Clinical Pharmacist 10/20/2023 7:05 AM

## 2023-10-20 NOTE — Progress Notes (Signed)
*  PRELIMINARY RESULTS* Echocardiogram 2D Echocardiogram has been performed.  Carolyne Fiscal 10/20/2023, 9:32 AM

## 2023-10-20 NOTE — ED Notes (Signed)
 Called lab to request draw of heparin and platelet levels. They said they would send someone shortly.

## 2023-10-20 NOTE — Progress Notes (Signed)
 PHARMACY - ANTICOAGULATION CONSULT NOTE  Pharmacy Consult for heparin Indication: pulmonary embolus and DVT  Allergies  Allergen Reactions   Macrobid [Nitrofurantoin] Other (See Comments)    Stomach pain    Patient Measurements: Height: 5\' 1"  (154.9 cm) Weight: 75.7 kg (166 lb 14.2 oz) IBW/kg (Calculated) : 47.8 Heparin Dosing Weight: 64.5 kg  Vital Signs: Temp: 98.4 F (36.9 C) (03/20 1227) Temp Source: Oral (03/20 1227) BP: 103/73 (03/20 1030) Pulse Rate: 116 (03/20 1030)  Labs: Recent Labs    10/19/23 1758 10/20/23 0151 10/20/23 1026  HGB 10.1* 8.7*  --   HCT 29.5* 25.3*  --   PLT 56* 48* 53*  APTT 39*  --   --   LABPROT 18.1*  --   --   INR 1.5*  --   --   HEPARINUNFRC  --  0.44  --   CREATININE 0.77 0.60  --   TROPONINIHS 62* 50*  --     Estimated Creatinine Clearance: 51.4 mL/min (by C-G formula based on SCr of 0.6 mg/dL).   Medical History: Past Medical History:  Diagnosis Date   Abnormal findings on diagnostic imaging of heart and coronary circulation    Anemia    FROM BLEEDING ULCER   Anxiety    takes Alprazolam daily as needed   Arthritis    dx with RA 2017   Bariatric surgery status    Cataract    Chronic heart failure with preserved ejection fraction (HFpEF) (HCC)    a. 11/2016 Echo: EF 55-60%, Gr1 DD, no rwma; b. 03/2022 Echo: EF 60-65%, no rwma, nl RV fxn, mildly elev RVSP, mild-mod dil LA, mildly dil RA, mild MR, mild-mod TR.   Diverticulosis    Gastro-esophageal reflux disease with esophagitis    Gastrojejunal ulcer with hemorrhage    Headache(784.0)    occasionally   History of blood transfusion    no abnormal reaction noted   History of bronchitis    last time many yrs ago   Hypothyroidism    takes Synthroid daily   Insomnia    Joint pain    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   Localized edema    Nocturia    Numbness    occasionally left arm at night   Obesity    Osteoporosis    takes Fosamax weekly   Pain in  joint involving pelvic region and thigh    Peripheral edema    takes Lasix daily as needed   PONV (postoperative nausea and vomiting)    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/2017   Rheumatoid arthritis, unspecified (HCC)    Rotator cuff arthropathy, 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   Sleep apnea    Stomach ulcer    Unspecified injury of muscle(s) and tendon(s) of the rotator cuff of left shoulder, subsequent encounter    Unspecified osteoarthritis, unspecified site    Wears glasses    Wears partial dentures    Assessment: 83 y/o female presenting with weakenss. PMH significant for  multiple myeloma, CKD, RA, chronic hypotension on midodrine, OSA, hypertension, hyperlipidemia, hypothyroidism. Imaging shows DVT in right femoral vein as well as submassive PE. Pharmacy has been consulted to initiate heparin infusion. Per chart review, patient was not on anticoagulation prior to admission.  Baseline labs: hgb 10.1, plt 56, INR pending  Platelets low at BL - discussed benefits outweigh risks with MD, will proceed with heparin infusion as patient has submassive PE and R DVT.  Goal of Therapy:  Heparin level 0.3-0.7  units/ml Monitor platelets by anticoagulation protocol: Yes  03/20 0151 HL 0.44, therapeutic x 1, Plt trending down slightly from 56 to 44 03/20 1026 HL 0.35, therapeutic x 2, PLT stable   Plan:  Will continue heparin infusion at 1100 units/hr Can transition to daily monitoring of HL and PLTs Monitor CBC daily   Thank you for involving pharmacy in this patient's care.   Paulita Fujita, PharmD Clinical Pharmacist  10/20/2023 12:32 PM

## 2023-10-20 NOTE — ED Notes (Signed)
 Called lab again about having labs drawn, Aggie Cosier said that it hadn't been passed along and they will send someone down, asap.

## 2023-10-20 NOTE — Consult Note (Signed)
 Cardiology Consult    Patient ID: Kathy [name] MRN: [id number], DOB/AGE: 1940/10/29   Admit date: 10/19/2023 Date of Consult: 10/20/2023  Primary Physician: Kathy Halon, MD Primary Cardiologist: Kathy Rich, MD Requesting Provider: Laurette Schimke, MD  Patient Profile    Kathy [name] is a 83 y.o. 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  Past Medical History  Subjective  Past Medical History:  Diagnosis Date   Abnormal findings on diagnostic imaging of heart and coronary circulation    Anemia    FROM BLEEDING ULCER   Anxiety    takes Alprazolam daily as needed   Arthritis    dx with RA 2017   Bariatric surgery status    Cataract    Chronic heart failure with preserved ejection fraction (HFpEF) (HCC)    a. 11/2016 Echo: EF 55-60%, Gr1 DD, no rwma; b. 03/2022 Echo: EF 60-65%, no rwma, nl RV fxn, mildly elev RVSP, mild-mod dil LA, mildly dil RA, mild MR, mild-mod TR.   Diverticulosis    Gastro-esophageal reflux disease with esophagitis    Gastrojejunal ulcer with hemorrhage    Headache(784.0)    occasionally   History of blood transfusion    no abnormal reaction noted   History of bronchitis    last time many yrs ago   Hypothyroidism    takes Synthroid daily   Insomnia    Joint pain    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   Localized edema    Nocturia    Numbness    occasionally left arm at night   Obesity    Osteoporosis    takes Fosamax weekly   Pain in joint involving pelvic region and thigh    Peripheral edema    takes Lasix daily as needed   PONV (postoperative nausea and vomiting)    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/2017   Rheumatoid arthritis,  unspecified (HCC)    Rotator cuff arthropathy, 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   Sleep apnea    Stomach ulcer    Unspecified injury of muscle(s) and tendon(s) of the rotator cuff of left shoulder, subsequent encounter    Unspecified osteoarthritis, unspecified site    Wears glasses    Wears partial dentures     Past Surgical History:  Procedure Laterality Date   ABDOMINAL HYSTERECTOMY     partial   cataract surgery Bilateral    CHOLECYSTECTOMY     COLONOSCOPY     ESOPHAGOGASTRODUODENOSCOPY (EGD) WITH PROPOFOL N/A 01/11/2022   Procedure: ESOPHAGOGASTRODUODENOSCOPY (EGD) WITH PROPOFOL;  Surgeon: Kathy Boop, MD;  Location: [facility] ENDOSCOPY;  Service: Gastroenterology;  Laterality: N/A;   EYE SURGERY     CATARACTS BOTH   gastric bypass surgery     HOT HEMOSTASIS N/A 01/11/2022   Procedure: HOT HEMOSTASIS (ARGON PLASMA COAGULATION/BICAP);  Surgeon: Kathy Boop, MD;  Location: [facility] ENDOSCOPY;  Service: Gastroenterology;  Laterality: N/A;   IR IMAGING GUIDED PORT INSERTION  05/23/2023   JOINT REPLACEMENT Bilateral    hip   REVERSE SHOULDER ARTHROPLASTY Right 05/11/2016   Procedure: REVERSE SHOULDER ARTHROPLASTY;  Surgeon: Kathy Lucy, MD;  Location: [facility] OR;  Service: Orthopedics;  Laterality: Right;   REVISION TOTAL HIP ARTHROPLASTY Left 10/03/2013   Kathy [name]   ROTATOR CUFF REPAIR  SCLEROTHERAPY  01/11/2022   Procedure: SCLEROTHERAPY;  Surgeon: Kathy Boop, MD;  Location: [facility] ENDOSCOPY;  Service: Gastroenterology;;   TOTAL HIP REVISION Left 10/03/2013   Procedure: TOTAL HIP REVISION- left;  Surgeon: Kathy Lewandowsky, MD;  Location: [facility] OR;  Service: Orthopedics;  Laterality: Left;   TOTAL KNEE ARTHROPLASTY Left 11/29/2017   Procedure: LEFT TOTAL KNEE ARTHROPLASTY;  Surgeon: Kathy Lucy, MD;  Location: [facility] OR;  Service: Orthopedics;  Laterality: Left;   TOTAL SHOULDER ARTHROPLASTY Left 11/09/2016   Procedure: TOTAL REVERSE SHOULDER ARTHROPLASTY;  Surgeon: Kathy Lucy, MD;  Location: [facility] OR;   Service: Orthopedics;  Laterality: Left;   TOTAL SHOULDER REPLACEMENT Left 10/2016   TOTAL THYROIDECTOMY     UPPER GASTROINTESTINAL ENDOSCOPY       Allergies  Allergies  Allergen Reactions   Macrobid [Nitrofurantoin] Other (See Comments)    Stomach pain      History of Present Illness   Ms. Kathy [name] is an 83 y.o. 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  She has been followed by Kathy. Wyline [name] in [place], establishing care in 2018 in the setting of lower ext swelling and palpitations.  Echo in 11/2016 showed nl LV fxn w/ grade 1 diastolic dysfxn.  She has since been managed conservatively w/ PRN lasix.  F/u echo in 03/2022 showed LVEF or 60-65% w/ mildly elev PASP, and mild MR/mild-mod TR.  She was last seen in cardiology clinic in 12/2022, at which time she was stable, however subsequently required multiple admissions in the setting of weakness, symptomatic hypercalcemia, AKI (multiple admissions), anemia/GIB req PRBCs (03/2013), sepsis/UTI (05/2023), E coli bacteremia (06/2023).  Most recently, she was admitted from 2/28 to 3/3 due to hypocalcemia and hypotension requiring vasopressors and subsequently midodrine.  Adrenal insuff was ruled out.  Ca2+ was aggressively supplemented w/ stabilization.  She did req 2 units of PRBCs in the setting of hgb ot 5.4.  Following discharge, she has been staying @ [facility].  She has been confined to her room at [facility] do due an ongoing flu outbreak.  Beginning ~ 5 days ago, she began to experience dyspnea and cough, requiring oxygen therapy.  This progressively worsened ~ 3 days ago requiring additional oxygen support. She states she has had a mild productive cough and chest discomfort with her SOB. She denies  fever, chills, sore throat, nausea, vomiting, diarrhea or abdominal pain. She is weak, but has not fallen or had significant injury. She has chronic lower ext edema.  Notes indicate that she had labs @ her facility which showed severe hypokalemia with a potassium of 1.9 prompting ED transfer.  Here, BP was 104/71, RR 28, afebrile, SpO2 100% on 4L. ECG showed sinus tachycardia, 103, LAD, and RBBB - no acute changes.  Labs showed potassium <2.0, calcium 6.2, phosphorus <1.0, albumin 2.0, and troponin 62.  CT chest showed acute pulmonary embolus in the right and left pulmonary arteries and in their lobar and segmental branches w/ high clot burden and evidence of R heart strain.  Moderate to large pericardial effusion was also noted.  [place] LE Venous showed nonocclusive thrombi in the right and left lower extremity. CXR showed low lung volume. Nonspecific opacities throughout bilateral lungs were noted - essentially similar to the prior study and may represent underlying atelectasis/scarring. She is positive for Influenza A.  We've been asked  to eval due to concern for pericardial effusion. She cont to note dyspnea, but denies c/p.  She is currently hemodynamically stable.   Inpatient Medications  Subjective    acyclovir  400 mg Oral BID   aspirin EC  81 mg Oral Daily   calcium carbonate  1 tablet Oral TID WC   ferrous sulfate  325 mg Oral Daily   ipratropium-albuterol  3 mL Nebulization Q4H   levothyroxine  137 mcg Oral Q0600   lidocaine  1 patch Transdermal Q24H   midodrine  7.5 mg Oral TID WC   multivitamin with minerals  1 tablet Oral q morning   oseltamivir  30 mg Oral BID   oxybutynin  10 mg Oral QHS   pantoprazole  40 mg Oral Q breakfast    Family History    Family History  Problem Relation Age of Onset   Diabetes Mother    Congestive Heart Failure Mother    Lung cancer Father    Thrombosis Sister    CAD Brother        CABG   Colon cancer Neg Hx    Esophageal cancer Neg Hx    Stomach  cancer Neg Hx    Rectal cancer Neg Hx    She indicated that her mother is deceased. She indicated that her father is deceased. She indicated that her sister is deceased. She indicated that all of her three brothers are alive. She indicated that her maternal grandmother is deceased. She indicated that her maternal grandfather is deceased. She indicated that her paternal grandmother is deceased. She indicated that her paternal grandfather is deceased. She indicated that the status of her neg hx is unknown.   Social History    Social History   Socioeconomic History   Marital status: Divorced    Spouse name: Not on file   Number of children: 0   Years of education: Not on file   Highest education level: Not on file  Occupational History   Occupation: disabled  Tobacco Use   Smoking status: Never    Passive exposure: Never   Smokeless tobacco: Never  Vaping Use   Vaping status: Never Used  Substance and Sexual Activity   Alcohol use: No   Drug use: No   Sexual activity: Not Currently    Birth control/protection: Surgical  Other Topics Concern   Not on file  Social History Narrative   Lives alone : "at [facility] for rehab right now."   Social Drivers of Health   Financial Resource Strain: Low Risk  (09/14/2021)   Overall Financial Resource Strain (CARDIA)    Difficulty of Paying Living Expenses: Not hard at all  Food Insecurity: No Food Insecurity (09/30/2023)   Hunger Vital Sign    Worried About Running Out of Food in the Last Year: Never true    Ran Out of Food in the Last Year: Never true  Transportation Needs: No Transportation Needs (09/30/2023)   PRAPARE - Transportation    Lack of Transportation (Medical): No    Lack of Transportation (Non-Medical): No  Physical Activity: Sufficiently Active (09/14/2021)   Exercise Vital Sign    Days of Exercise per Week: 6 days    Minutes of Exercise per Session: 40 min  Stress: No Stress Concern Present (09/14/2021)   [employer] of Occupational Health - Occupational Stress Questionnaire    Feeling of Stress : Not at all  Social Connections: Moderately Integrated (09/30/2023)   Social Connection and Isolation Panel [  NHANES]    Frequency of Communication with Friends and Family: Three times a week    Frequency of Social Gatherings with Friends and Family: More than three times a week    Attends Religious Services: 1 to 4 times per year    Active Member of [employer] or Organizations: Yes    Attends Banker Meetings: 1 to 4 times per year    Marital Status: Divorced  Catering manager Violence: Not At Risk (09/30/2023)   Humiliation, Afraid, Rape, and Kick questionnaire    Fear of Current or Ex-Partner: No    Emotionally Abused: No    Physically Abused: No    Sexually Abused: No     Review of Systems    General:  No chills, fever, night sweats or weight changes.  Cardiovascular:  Has front chest discomfort. +++ dyspnea @ SNF, no orthopnea, palpitations, paroxysmal nocturnal dyspnea. Dermatological: No rash, lesions/masses Respiratory: Has cough and dyspnea. No wheezing. Urologic: No hematuria, dysuria Abdominal:   No nausea, vomiting, diarrhea, bright red blood per rectum, melena, or hematemesis Neurologic:  No visual changes, wkns, changes in mental status. Extremities: +++ Bilateral LE Edema All other systems reviewed and are otherwise negative except as noted above.    Objective  Physical Exam    Blood pressure 103/73, pulse (!) 116, temperature 98.1 F (36.7 C), temperature source Oral, resp. rate (!) 24, height 5\' 1"  (1.549 m), weight 75.7 kg, SpO2 99%.  General: Pleasant, Ill appearing Psych: Flat affect. Neuro: Alert and oriented X 2. Moves all extremities spontaneously. HEENT: Normal  Neck: Supple without bruits or JVD. Lungs:  Resp reg and unlabored.  Scattered rhonchi w/ faint exp wheezing A/P. Heart: Tachycardiac, no s3, s4, or murmurs.  No rubs. Abdomen: Soft, non-tender,  non-distended, BS + x 4.  Extremities: No clubbing or cyanosis. DP/PT 1+, Radials 1+ and equal bilaterally. Bilateral lower leg 2+ pitting edema.   Labs    Cardiac Enzymes Recent Labs  Lab 10/19/23 1758 10/20/23 0151  TROPONINIHS 62* 50*     BNP    Component Value Date/Time   BNP 713.1 (H) 10/19/2023 1757   Lab Results  Component Value Date   WBC 1.2 (LL) 10/20/2023   HGB 8.7 (L) 10/20/2023   HCT 25.3 (L) 10/20/2023   MCV 93.7 10/20/2023   PLT 48 (L) 10/20/2023    Recent Labs  Lab 10/19/23 1758 10/20/23 0151  NA 142 141  K <2.0* 2.4*  CL 103 106  CO2 29 26  BUN 13 13  CREATININE 0.77 0.60  CALCIUM 6.2* 6.3*  PROT 4.8*  --   BILITOT 1.7*  --   ALKPHOS 101  --   ALT 14  --   AST 13*  --   GLUCOSE 99 136*   Lab Results  Component Value Date   CHOL 134 03/16/2022   HDL 57 03/16/2022   LDLCALC 64 03/16/2022   TRIG 59 03/16/2022     Radiology Studies    CT Angio Chest PE W/Cm &/Or Wo Cm Result Date: 10/19/2023 CLINICAL DATA:  Hypokalemia weakness.  Multiple myeloma. EXAM: CT ANGIOGRAPHY CHEST WITH CONTRAST TECHNIQUE: Multidetector CT imaging of the chest was performed using the standard protocol during bolus administration of intravenous contrast. Multiplanar CT image reconstructions and MIPs were obtained to evaluate the vascular anatomy. RADIATION DOSE REDUCTION: This exam was performed according to the departmental dose-optimization program which includes automated exposure control, adjustment of the mA and/or kV according to patient size and/or use  of iterative reconstruction technique. CONTRAST:  75mL OMNIPAQUE IOHEXOL 350 MG/ML SOLN COMPARISON:  Chest radiograph 10/19/2023 and CT scan 03/11/2023 FINDINGS: Cardiovascular: Acute pulmonary embolus in the right and left pulmonary arteries and in their lobar and segmental branches. Clot burden is high. Right ventricular to left ventricular ratio 1.45 compatible with right heart dysfunction/at least submassive  pulmonary embolus. Enlarged main pulmonary artery compatible pulmonary arterial hypertension. There is a new moderate to large pericardial effusion compared to 03/11/2023 likewise not present on the CT abdomen from 05/11/2023. Atherosclerotic aorta and branch vessels. Poor visualization of the circumflex coronary artery, possibly diminutive. Right Port-A-Cath tip: Right atrium. Mediastinum/Nodes: Mildly dilated and thick-walled mid and distal thoracic esophagus. Air fluid level in the esophagus, possibly from dysmotility or reflux. Lungs/Pleura: Hazy peripheral ground-glass opacities in both lungs nonspecific in could be a manifestation of pulmonary hemorrhage, edema, atypical pneumonia, or acute hypersensitivity pneumonitis. Atelectasis in both lower lobes. Substantial airway plugging both lower lobes. Upper Abdomen: Nonobstructive left nephrolithiasis. Postoperative findings in the stomach. Cholecystectomy. Musculoskeletal: Bilateral verse shoulder arthroplasties. Various scattered speckled lucency throughout the bony structures likely manifestation of myeloma. Thoracic spondylosis. Deformity of the sternal body and manubrium likely related to prior fractures which may be pathologic, but not present on 03/11/2023. New anterior wedging at the T5 vertebral body compatible with new anterior wedge compression fracture. New superior endplate compression fracture at L2. Review of the MIP images confirms the above findings. IMPRESSION: 1. Acute pulmonary embolus in the right and left pulmonary arteries and in their lobar and segmental branches. Clot burden is high. Positive for acute PE with CT evidence of right heart strain (RV/LV Ratio = 1.45) consistent with at least submassive (intermediate risk) PE. The presence of right heart strain has been associated with an increased risk of morbidity and mortality. Please refer to the "Code PE Focused" order set in EPIC. 2. New moderate to large pericardial effusion compared to  03/11/2023 likewise not present on the CT abdomen from 05/11/2023. Tamponade not excluded, and the picture may be difficult to sort on physical exam due to the pulmonary embolus with right heart dysfunction. If clinically warranted, echocardiography could be utilized for further characterization. 3. Hazy peripheral ground-glass opacities in both lungs nonspecific in could be a manifestation of pulmonary hemorrhage, edema, atypical pneumonia, or acute hypersensitivity pneumonitis. 4. Substantial airway plugging in both lower lobes with atelectasis in both lower lobes. 5. Mildly dilated and thick-walled mid and distal thoracic esophagus. Air fluid level in the esophagus, possibly from dysmotility or reflux. 6. New anterior wedge compression fracture at T5. New superior endplate compression fracture at L2. Partially healed new fractures of the sternal body and manubrium. 7.  Aortic Atherosclerosis (ICD10-I70.0). Critical Value/emergent results were called by telephone at the time of interpretation on 10/19/2023 at 7:54 pm to provider Bing Neighbors , who verbally acknowledged these results. Electronically Signed   By: Gaylyn Rong M.D.   On: 10/19/2023 19:56   US Venous Img Lower Bilateral (DVT) Result Date: 10/19/2023 CLINICAL DATA:  Leg swelling EXAM: BILATERAL LOWER EXTREMITY VENOUS DOPPLER ULTRASOUND TECHNIQUE: Gray-scale sonography with graded compression, as well as color Doppler and duplex ultrasound were performed to evaluate the lower extremity deep venous systems from the level of the common femoral vein and including the common femoral, femoral, profunda femoral, popliteal and calf veins including the posterior tibial, peroneal and gastrocnemius veins when visible. The superficial great saphenous vein was also interrogated. Spectral Doppler was utilized to evaluate flow at rest and with  distal augmentation maneuvers in the common femoral, femoral and popliteal veins. COMPARISON:  None Available. FINDINGS:  RIGHT LOWER EXTREMITY Common Femoral Vein: Nonocclusive thrombus. Vessel partially compressible. Saphenofemoral Junction: No evidence of thrombus. Normal compressibility and flow on color Doppler imaging. Profunda Femoral Vein: No evidence of thrombus. Normal compressibility and flow on color Doppler imaging. Femoral Vein: Nonocclusive thrombus noted. Vessel partially compressible. Popliteal Vein: Nonocclusive thrombus noted. Vessel partially compressible. Calf Veins: Calf veins poorly visualized. Superficial Great Saphenous Vein: No evidence of thrombus. Normal compressibility. Venous Reflux:  None visualized Other Findings:  None. LEFT LOWER EXTREMITY Common Femoral Vein: Nonocclusive thrombus. Vessel is partially compressible. Saphenofemoral Junction: Nonocclusive thrombus. Profunda Femoral Vein: No evidence of thrombus. Normal compressibility and flow on color Doppler imaging. Femoral Vein: Nonocclusive thrombus.  Vessel partially compressible. Popliteal Vein: Nonocclusive thrombus. Vessel partially compressible. Calf Veins: Not well visualized. Superficial Great Saphenous Vein: Nonocclusive thrombus. Venous Reflux:  None. Other Findings:  None. IMPRESSION: In the right lower extremity, nonocclusive thrombus noted in the right common femoral vein, femoral vein and popliteal vein. In the left lower extremity, nonocclusive thrombus noted in the left common femoral vein, femoral vein, popliteal vein, and greater saphenous vein. Electronically Signed   By: Charlett Nose M.D.   On: 10/19/2023 19:24   CT Head Wo Contrast Result Date: 10/19/2023 CLINICAL DATA:  Head trauma, minor (Age >= 65y); Neck trauma (Age >= 65y). History of multiple myeloma. EXAM: CT HEAD WITHOUT CONTRAST CT CERVICAL SPINE WITHOUT CONTRAST TECHNIQUE: Multidetector CT imaging of the head and cervical spine was performed following the standard protocol without intravenous contrast. Multiplanar CT image reconstructions of the cervical spine were  also generated. RADIATION DOSE REDUCTION: This exam was performed according to the departmental dose-optimization program which includes automated exposure control, adjustment of the mA and/or kV according to patient size and/or use of iterative reconstruction technique. COMPARISON:  CT scan head from 05/11/2023 and CT scan neck from 03/23/2005. FINDINGS: CT HEAD FINDINGS Brain: No evidence of acute infarction, hemorrhage, hydrocephalus, extra-axial collection or mass lesion/mass effect. There is bilateral periventricular hypodensity, which is non-specific but most likely seen in the settings of microvascular ischemic changes. Mild in extent. Otherwise normal appearance of brain parenchyma. Cerebral volume loss with enlargement of the ventricles. Vascular: No hyperdense vessel or unexpected calcification. Intracranial arteriosclerosis. Skull: Normal. Negative for fracture. There are multiple lytic lesions throughout the bones, compatible with patient's known history of multiple myeloma. No pathological fracture. Sinuses/Orbits: Mild-to-moderate mucoperiosteal thickening noted in the right ethmoidal air cells. There is small air-fluid level in the right chamber of the sphenoid sinus. Correlate clinically for sinusitis. Other: Visualized mastoid air cells are unremarkable. No mastoid effusion. CT CERVICAL SPINE FINDINGS Alignment: There is reversal of cervical lordosis. This examination does not assess for ligamentous injury or stability. Skull base and vertebrae: No acute fracture. No primary bone lesion. There are multiple lytic lesions throughout the bones, compatible with patient's known history of multiple myeloma. No pathological fracture. Soft tissues and spinal canal: No prevertebral fluid or swelling. No visible canal hematoma. Disc levels: Moderate to severe multilevel degenerative changes noted characterized by reduced intervertebral disc height (most pronounced at C3-4 level), endplate  sclerosis/irregularity, facet arthropathy and marginal osteophyte formation. Upper chest: Negative. IMPRESSION: 1. No acute intracranial abnormality. 2. No acute osseous injury of the cervical spine. 3. Multiple lytic lesions throughout the bones, compatible with patient's known history of multiple myeloma. No pathological fracture. 4. Multiple other observations, as described above. Electronically Signed  By: Jules Schick M.D.   On: 10/19/2023 18:44   CT Cervical Spine Wo Contrast Result Date: 10/19/2023 CLINICAL DATA:  Head trauma, minor (Age >= 65y); Neck trauma (Age >= 65y). History of multiple myeloma. EXAM: CT HEAD WITHOUT CONTRAST CT CERVICAL SPINE WITHOUT CONTRAST TECHNIQUE: Multidetector CT imaging of the head and cervical spine was performed following the standard protocol without intravenous contrast. Multiplanar CT image reconstructions of the cervical spine were also generated. RADIATION DOSE REDUCTION: This exam was performed according to the departmental dose-optimization program which includes automated exposure control, adjustment of the mA and/or kV according to patient size and/or use of iterative reconstruction technique. COMPARISON:  CT scan head from 05/11/2023 and CT scan neck from 03/23/2005. FINDINGS: CT HEAD FINDINGS Brain: No evidence of acute infarction, hemorrhage, hydrocephalus, extra-axial collection or mass lesion/mass effect. There is bilateral periventricular hypodensity, which is non-specific but most likely seen in the settings of microvascular ischemic changes. Mild in extent. Otherwise normal appearance of brain parenchyma. Cerebral volume loss with enlargement of the ventricles. Vascular: No hyperdense vessel or unexpected calcification. Intracranial arteriosclerosis. Skull: Normal. Negative for fracture. There are multiple lytic lesions throughout the bones, compatible with patient's known history of multiple myeloma. No pathological fracture. Sinuses/Orbits:  Mild-to-moderate mucoperiosteal thickening noted in the right ethmoidal air cells. There is small air-fluid level in the right chamber of the sphenoid sinus. Correlate clinically for sinusitis. Other: Visualized mastoid air cells are unremarkable. No mastoid effusion. CT CERVICAL SPINE FINDINGS Alignment: There is reversal of cervical lordosis. This examination does not assess for ligamentous injury or stability. Skull base and vertebrae: No acute fracture. No primary bone lesion. There are multiple lytic lesions throughout the bones, compatible with patient's known history of multiple myeloma. No pathological fracture. Soft tissues and spinal canal: No prevertebral fluid or swelling. No visible canal hematoma. Disc levels: Moderate to severe multilevel degenerative changes noted characterized by reduced intervertebral disc height (most pronounced at C3-4 level), endplate sclerosis/irregularity, facet arthropathy and marginal osteophyte formation. Upper chest: Negative. IMPRESSION: 1. No acute intracranial abnormality. 2. No acute osseous injury of the cervical spine. 3. Multiple lytic lesions throughout the bones, compatible with patient's known history of multiple myeloma. No pathological fracture. 4. Multiple other observations, as described above. Electronically Signed   By: Jules Schick M.D.   On: 10/19/2023 18:44   DG Chest Portable 1 View Result Date: 10/19/2023 CLINICAL DATA:  Cough.  Weakness. EXAM: PORTABLE CHEST 1 VIEW COMPARISON:  09/30/2023. FINDINGS: Low lung volume. There are nonspecific opacities throughout bilateral lungs which are essentially similar to the prior study and may represent underlying atelectasis/scarring. Bilateral lung fields are otherwise clear. No acute consolidation or lung collapse. Bilateral costophrenic angles are clear. Normal cardio-mediastinal silhouette. No acute osseous abnormalities. Bilateral shoulder arthroplasty noted. The soft tissues are within normal limits.  Right-sided CT Port-A-Cath is seen with its tip overlying the cavoatrial junction region. IMPRESSION: No acute cardiopulmonary abnormality. Electronically Signed   By: Jules Schick M.D.   On: 10/19/2023 18:36   DG Chest Portable 1 View Result Date: 09/30/2023 CLINICAL DATA:  Hypertension, weakness. EXAM: PORTABLE CHEST 1 VIEW COMPARISON:  May 11, 2023. FINDINGS: The heart size and mediastinal contours are within normal limits. No acute pulmonary disease is noted. 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 Right internal jugular Port-A-Cath is noted. IMPRESSION: No active disease. Electronically Signed   By: Lupita Raider M.D.   On: 09/30/2023 15:04      ECG & Cardiac Imaging  Sinus tachycardia, 103, LAD, RBBB - personally reviewed.  Assessment & Plan    1. Acute bilateral PE:  pt presented due to hypokalemia noted on outpt labs, however was found to be tachycardic w/ CTA chest showing bilat PE.  She has also been found to have bilat LE DVTs, and is + for flu A.  She is currently on heparin infusion.  Vascular surgery seeing to decide upon possible use of TPA, though chronic pancytopenia and finding of pericardial effusion on CTA concerning.  Await echo.  2. Bilat  DVT:  Ultrasound w/ bilat nonocclusive thrombi.  Heparin per primary. Supportive care. VVS to follow.  Follow H/H closely.  3.  Pericardial Effusion:  incidental finding of mod-large pericardial effusion on CTA in the setting of above.  No rub.  Hemodynamically stable.  Does not appear to be circumferential on CT.  Further recs pending echo though at this time there does not appear to be an indication for pericardiocentesis.  4. Hypokalemia:  K <2.0 on arrival   2.4 today.  Ongoing supplementation per IM.  5. 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  On Ca2+ supplementation in outpt setting. Ca2+ = 6.2 on arrival - only slightly lower than prior baseline.  Per IM.  6. Hypotension: chronic BP 104/71. Continue  midodrine 7.5 mg TID.  7. Demand Ischemia:  in setting of bilat PE w/ R heart strain, Troponin 62  50, likely demand ischemia.  F/u echo.  W/ multiple co-morbidities, no plans for ischemic eval at this time.  8. Influenza A:  noted to be flu A positive.  Tamiflu, bronchodilators, and expectorants per primary.   9.  Chronic HFpEF:  Previously nl EF.  2+ bilat LEE on exam.  Lasix listed as 20 mg daily in outpt meds - currently held in setting of profound hypokalemia.  Suspect edema largely 2/2 hypoalbuminemia - 2.0.  Would benefit from ongoing leg elevation and compression.  10.  Anemia of chronic dzs:  s/p 2 units of PRBCs earlier this month.  H/H relatively stable.  Follow on anticoagulation.  She does have a h/o GIB in 01/2023.  Risk Assessment/Risk Scores:        New York Heart Association (NYHA) Functional Class NYHA Class II    Signed, Nicolasa Ducking, NP 10/20/2023, 11:23 AM  For questions or updates, please contact   Please consult [email or web address] for contact info under Cardiology/STEMI.

## 2023-10-20 NOTE — [place] FL2 (Signed)
 [place] MEDICAID FL2 LEVEL OF CARE FORM     IDENTIFICATION  Patient Name: Kathy [name] Birthdate: [birth year] Sex: female Admission Date (Current Location): 10/19/2023  [place] and [place] Number:  Chiropodist and Address:  [facility], [address] [place], [place] [address]      Provider Number: [id number]  Attending Physician Name and Address:  Darlin Priestly, MD  Relative Name and Phone Number:       Current Level of Care: Hospital Recommended Level of Care: Skilled Nursing Facility Prior Approval Number:    Date Approved/Denied:   PASRR Number: [id number]  Discharge Plan: SNF    Current Diagnoses: Patient Active Problem List   Diagnosis Date Noted   Acute pulmonary embolism (HCC) 10/19/2023   DVT (deep venous thrombosis) (HCC) 10/19/2023   Pancytopenia (HCC) 10/19/2023   Pericardial effusion 10/19/2023   Myocardial injury 10/19/2023   Compression fracture of L2 (HCC) 10/19/2023   Compression fracture of T5 vertebra (HCC) 10/19/2023   Hypophosphatemia 10/19/2023   Fall at home, initial encounter 10/19/2023   Influenza A 10/19/2023   Hypoalbuminemia 09/30/2023   Hyperkalemia 09/30/2023   Gram-negative bacteremia 06/17/2023   Hypocalcemia 06/16/2023   Hypernatremia 05/18/2023   Protein-calorie malnutrition, severe 05/17/2023   Sepsis secondary to UTI (HCC) 05/12/2023   Electrolyte disturbance 05/12/2023   Thrombocytopenia (HCC) 05/12/2023   AMS (altered mental status) 05/12/2023   Multiple myeloma (HCC) 04/14/2023   Hypercalcemia of malignancy 04/07/2023   General weakness 04/07/2023   Recurrent falls 04/07/2023   Hypotension 04/07/2023   Hypoalbuminemia due to protein-calorie malnutrition (HCC) 04/07/2023   Acute prostatitis 04/07/2023   Chronic kidney disease, stage 3b (HCC) 04/07/2023   Dysphagia 04/07/2023   Acute pancreatitis 04/07/2023   History of thyroid cancer 04/07/2023   Anemia 03/30/2023   Physical  deconditioning 03/30/2023   Elevated serum immunoglobulin free light chain level 03/30/2023   Leg swelling 02/21/2023   Hospital discharge follow-up 02/21/2023   Hypomagnesemia 02/21/2023   Urge incontinence of urine 02/21/2023   Hypercalcemia 01/23/2023   AKI (acute kidney injury) (HCC) 01/23/2023   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   Hammer toe of left foot 09/15/2022   Left foot pain 09/15/2022   Iron deficiency anemia due to chronic blood loss 07/19/2022   Rectal bleeding 01/21/2022   Gastrojejunal ulcer with hemorrhage    Encounter for general adult medical examination with abnormal findings 09/16/2021   Hypokalemia 09/16/2021   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   Bilateral impacted cerumen 11/19/2020   Hearing loss 11/19/2020   Malignant tumor of thyroid gland (HCC) 10/08/2020   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   Polyarthritis    Bariatric surgery status    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/2017   Rheumatoid arthritis (HCC) 11/16/2016   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   Rotator cuff arthropathy, 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   S/P shoulder replacement 05/11/2016   Allergic rhinitis 10/19/2013   GERD (gastroesophageal reflux disease) 10/19/2013   Osteoporosis    Essential hypertension    Acquired hypothyroidism    Hyperlipidemia    Anxiety    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 Currently NPO.)  AMBULATORY STATUS  COMMUNICATION OF NEEDS Skin     Verbally Bruising, Other (Comment) (Weeping.)                       Personal Care Assistance Level of Assistance              Functional Limitations Info  Sight, Hearing, Speech Sight Info: Adequate Hearing Info: Adequate Speech Info: Adequate    SPECIAL CARE FACTORS FREQUENCY                       Contractures Contractures Info: Not present    Additional Factors Info  Code Status, Allergies Code Status Info: DNR Allergies Info: Macrobid (Nitrofurantoin)           Current Medications (10/20/2023):  This is the current hospital active medication list Current Facility-Administered Medications  Medication Dose Route Frequency Provider Last Rate Last Admin   0.9 %  sodium chloride infusion   Intravenous Continuous Lorretta Harp, MD 75 mL/hr at 10/20/23 0750 Rate Verify at 10/20/23 0750   acetaminophen (TYLENOL) tablet 650 mg  650 mg Oral Q6H PRN Lorretta Harp, MD       acyclovir (ZOVIRAX) 200 MG capsule 400 mg  400 mg Oral BID Lorretta Harp, MD   400 mg at 10/20/23 1023   albuterol (PROVENTIL) (2.5 MG/3ML) 0.083% nebulizer solution 2.5 mg  2.5 mg Inhalation Q4H PRN Lorretta Harp, MD       aspirin EC tablet 81 mg  81 mg Oral Daily Lorretta Harp, MD   81 mg at 10/20/23 1022   calcium carbonate (OS-CAL - dosed in mg of elemental calcium) tablet 1,250 mg  1 tablet Oral TID WC Mansy, Jan A, MD   1,250 mg at 10/20/23 0754   dextromethorphan-guaiFENesin (MUCINEX DM) 30-600 MG per 12 hr tablet 1 tablet  1 tablet Oral BID PRN Lorretta Harp, MD       diphenhydrAMINE (BENADRYL) injection 12.5 mg  12.5 mg Intravenous Q8H PRN Lorretta Harp, MD       ferrous sulfate tablet 325 mg  325 mg Oral Daily Lorretta Harp, MD   325 mg at 10/20/23 1010   heparin ADULT infusion 100 units/mL (25000 units/28mL)  1,100 Units/hr Intravenous Continuous Rockwell Alexandria, RPH 11 mL/hr at 10/20/23 0750 1,100 Units/hr at 10/20/23 0750   ipratropium-albuterol (DUONEB) 0.5-2.5 (3) MG/3ML  nebulizer solution 3 mL  3 mL Nebulization Q4H Lorretta Harp, MD   3 mL at 10/20/23 0800   levothyroxine (SYNTHROID) tablet 137 mcg  137 mcg Oral Q0600 Lorretta Harp, MD   137 mcg at 10/20/23 0603   lidocaine (LIDODERM) 5 % 1 patch  1 patch Transdermal Q24H Lorretta Harp, MD   1 patch at 10/19/23 2217   methocarbamol (ROBAXIN) tablet 500 mg  500 mg Oral Q8H PRN Lorretta Harp, MD       midodrine (PROAMATINE) tablet 7.5 mg  7.5 mg Oral TID WC Lorretta Harp, MD   7.5 mg at 10/20/23 0753   morphine (PF) 2 MG/ML injection 1 mg  1 mg Intravenous Q4H PRN Lorretta Harp, MD       multivitamin with minerals tablet 1 tablet  1 tablet Oral q morning Lorretta Harp, MD   1 tablet at 10/20/23 1010   oseltamivir (TAMIFLU) capsule 30 mg  30 mg Oral BID Lorretta Harp, MD   30 mg at 10/20/23 1022   oxybutynin (DITROPAN-XL) 24 hr tablet 10 mg  10 mg Oral QHS Lorretta Harp, MD       oxyCODONE-acetaminophen (PERCOCET/ROXICET) 5-325 MG per tablet 1 tablet  1 tablet Oral Q4H PRN Lorretta Harp, MD       pantoprazole (PROTONIX) EC tablet 40 mg  40 mg Oral Q breakfast Lorretta Harp, MD   40 mg at 10/20/23 0754   polyethylene glycol (MIRALAX / GLYCOLAX) packet 17 g  17 g Oral Daily PRN Lorretta Harp, MD       potassium chloride 10 mEq in 100 mL IVPB  10 mEq Intravenous Q1 Hr x 4 Barrie Folk, RPH 100 mL/hr at 10/20/23 1004 10 mEq at 10/20/23 1004   Current Outpatient Medications  Medication Sig Dispense Refill   acetaminophen (TYLENOL) 500 MG tablet Take 1 tablet (500 mg total) by mouth every 6 (six) hours as needed for mild pain, headache or fever (or Fever >/= 101). 30 tablet 0   acyclovir (ZOVIRAX) 400 MG tablet Take 1 tablet (400 mg total) by mouth 2 (two) times daily. 60 tablet 11   Adalimumab (HUMIRA PEN) 40 MG/0.4ML PNKT Inject 40 mg into the skin every Saturday.     albuterol (VENTOLIN HFA) 108 (90 Base) MCG/ACT inhaler Inhale 1 puff into the lungs every 4 (four) hours as needed for wheezing or shortness of breath.     aspirin EC 81 MG tablet  Take 81 mg by mouth daily.     calcium carbonate (TUMS - DOSED IN MG ELEMENTAL CALCIUM) 500 MG chewable tablet Chew 1 tablet (200 mg of elemental calcium total) by mouth 3 (three) times daily with meals.     ferrous sulfate 325 (65 FE) MG EC tablet Take 1 tablet (325 mg total) by mouth daily. 60 tablet 3   furosemide (LASIX) 20 MG tablet Take 20 mg by mouth daily.     lenalidomide (REVLIMID) 10 MG capsule Take 1 capsule (10 mg total) by mouth daily. Take 10 mg by mouth for 21 days. Take 21 days on, 7 days off every 28 days 21 capsule 0   levothyroxine (SYNTHROID) 137 MCG tablet Take 0.5 tablets (68.5 mcg total) by mouth daily at 6 (six) AM.     magnesium oxide (MAG-OX) 400 (240 Mg) MG tablet Take 1 tablet (400 mg total) by mouth daily.     midodrine (PROAMATINE) 2.5 MG tablet Take 3 tablets (7.5 mg total) by mouth 3 (three) times daily with meals.     Multiple Vitamin (MULTIVITAMIN WITH MINERALS) TABS tablet Take 1 tablet by mouth every morning.     naloxone (NARCAN) nasal spray 4 mg/0.1 mL Place 1 spray into the nose as needed (opioid reversal).     Omega-3 Fatty Acids (FISH OIL) 1000 MG CAPS Take 1 capsule by mouth daily.     omeprazole (PRILOSEC) 40 MG capsule Take 1 capsule (40 mg total) by mouth daily before breakfast. Open capsule and swallow granules with liquid or applesauce 90 capsule 3   oxybutynin (DITROPAN-XL) 10 MG 24 hr tablet Take 10 mg by mouth at bedtime.     polyethylene glycol (MIRALAX / GLYCOLAX) 17 g packet Take 17 g by mouth daily. 14 each 0   traMADol (ULTRAM) 50 MG tablet Take 1 tablet (50 mg total) by mouth every 8 (eight) hours as needed for moderate pain (pain score 4-6). 10 tablet 0     Discharge  Medications: Please see discharge summary for a list of discharge medications.  Relevant Imaging Results:  Relevant Lab Results:   Additional Information SS#: [id number]  Margarito Liner, LCSW

## 2023-10-20 NOTE — Progress Notes (Signed)
       CROSS COVER NOTE  NAME: Cathryn Gallery Drier MRN: [id number] DOB : 11/13/1940 ATTENDING PHYSICIAN: Darlin Priestly, MD    Date of Service   10/20/2023   HPI/Events of Note   Patient with bilateral PE with right heart strain and multiple electrolyte derangements Repeat lab results reported   Latest Reference Range & Units 10/20/23 21:37  BASIC METABOLIC PANEL  Rpt !!  Sodium 135 - 145 mmol/L 141  Potassium 3.5 - 5.1 mmol/L 3.4 (L)  Chloride 98 - 111 mmol/L 107  CO2 22 - 32 mmol/L 25  Glucose 70 - 99 mg/dL 99  BUN 8 - 23 mg/dL 13  Creatinine 7.82 - 9.56 mg/dL 2.13  Calcium 8.9 - 08.6 mg/dL 5.8 (LL)  Anion gap 5 - 15  9  Phosphorus 2.5 - 4.6 mg/dL 2.2 (L)  Magnesium 1.7 - 2.4 mg/dL 1.9  Albumin 3.5 - 5.0 g/dL 1.9 (L)  GFR, Estimated >60 mL/min >60  !!: Data is critical (LL): Data is critically low (L): Data is abnormally low Rpt: View report in Results Review for more information  Corrected calium 7.5  Interventions   Assessment/Plan: 40 po potassium  400 mg mag ox 1 gm calcium gluconate IV x1 25 gm albumin q 6h x 3 doses        Donnie Mesa NP [facility] Cross Cover 7pm-7am - check amion for availability Pager [phone]

## 2023-10-20 NOTE — Progress Notes (Signed)
  PROGRESS NOTE    Kathy [name]  NGE:[id number] DOB: 1941-05-11 DOA: 10/19/2023 PCP: [name], MD  ED10A/ED10A  LOS: 1 day   Brief hospital course:   Assessment & Plan: Kathy [name] is a 83 y.o. female with medical history significant of multiple myeloma on chemotherapy, hypotension on midodrine, HLD, hypothyroidism, anxiety, CKD stage IIIb, rheumatoid arthritis, stomach ulcer, pancytopenia, who presents with cough, SOB, lightheadedness and abnormal lab with hypokalemia.    Acute pulmonary embolism (HCC):  pt has acute PE with CT evidence of right heart strain.   --started on heparin gtt --plan for thrombectomy when potassium level improves, hope for tomorrow.   Acute hypoxemic respiratory failure --New O2 requirement, 2-4L, due to acute PE and Flu. --Continue supplemental O2 to keep sats >=92%, wean as tolerated  DVT (deep venous thrombosis) (HCC) --started on heparin gtt   Influenza A -Bronchodilators, as needed Mucinex --cont Tamiflu   Hypokalemia Hypomag Hypophos --monitor and supplement PRN --check level this evening to ensure potassium level normal tomorrow in order to proceed with thrombectomy  Pericardial effusion:  CTA showed moderate to large pericardial effusion.  Cardio consulted --Echo showed relatively small pericardial effusion.  No need for further management.  Pancytopenia (HCC):  Likely due to chemotherapy   Acquired hypothyroidism -Synthroid   History of hypotension -Continue home midodrine 7.5 mg 3 times daily --cont MIVF   Multiple myeloma (HCC) -Patient is on chemotherapy, last treatment was on 3/6   Myocardial injury:  Troponin 62, likely demand ischemia.   Compression fracture of L2 (HCC) and compression fracture of T5 vertebra (HCC) -Pain control: As needed morphine, Percocet, Tylenol -As needed Robaxin -Lidoderm patch   Fall at home, initial encounter -Fall precaution -PT/OT when able to (not ordered yet)     DVT prophylaxis: [id number] gtt Code Status: DNR  Family Communication: niece updated at bedside today Level of care: Stepdown Dispo:   The patient is from: SNF LTC Anticipated d/c is to: SNF LTC Anticipated d/c date is: 2-3 days   Subjective and Interval History:  Pt reported dyspnea, no chest pain.   Objective: Vitals:   10/20/23 1702 10/20/23 1730 10/20/23 1800 10/20/23 1830  BP:  93/60 (!) 97/59 (!) 89/59  Pulse:  (!) 120 (!) 114 (!) 124  Resp:  16 (!) 25 (!) 25  Temp: 98.2 F (36.8 C)     TempSrc: Oral     SpO2:      Weight:      Height:        Intake/Output Summary (Last 24 hours) at 10/20/2023 2024 Last data filed at 10/20/2023 0750 Gross per 24 hour  Intake 2210.87 ml  Output --  Net 2210.87 ml   Filed Weights   10/19/23 1904  Weight: 75.7 kg    Examination:   Constitutional: NAD, AAOx3 HEENT: conjunctivae and lids normal, EOMI CV: No cyanosis.   RESP: normal respiratory effort, on 2L 99% Extremities: pitting edema in BLE SKIN: warm, dry Neuro: II - XII grossly intact.   Psych: Normal mood and affect.  Appropriate judgement and reason   Data Reviewed: I have personally reviewed labs and imaging studies  Time spent: 50 minutes  Darlin Priestly, MD [facility] If 7PM-7AM, please contact night-coverage 10/20/2023, 8:24 PM

## 2023-10-20 NOTE — Consult Note (Signed)
 Hospital Consult    Reason for Consult:  Pulmonary Embolism  Requesting Physician:  Dr Lorretta Harp MD  MRN #:  [id number]  History of Present Illness: This is a 83 y.o. female  with medical history significant of multiple myeloma on chemotherapy, hypotension on midodrine, HLD, hypothyroidism, anxiety, CKD stage IIIb, rheumatoid arthritis, stomach ulcer, pancytopenia, who presents with cough, SOB, lightheadedness and abnormal lab with hypokalemia. Per report, pt was found to have hypokalemia with a potassium of 1.9 in facility.  Patient states that she has dry cough, mild SOB, and lightheadedness.  She has mild fluid chest discomfort.  No fever or chills. She had fall, no significant injury.  No loss of consciousness.  No unilateral numbness or tingling in extremities. She has some lower back pain.    On exam this morning patient appears frail and is uncomfortable in the emergency department.  She is currently on 10 L of oxygen via facemask getting a breathing treatment.  Patient has a productive cough.  Patient endorses shortness of breath even at rest.  Upon workup patient was noted to have bilateral pulmonary embolisms with right heart strain and an RV to LV ratio of 1.45.  Cardiology was at the bedside this morning seeing the patient when I arrived.  Their plan is to do an echocardiogram to evaluate for further emboli.  Patient also underwent ultrasounds of bilateral lower extremities which reveal right lower extremity nonocclusive thrombus in the right common femoral vein, femoral vein and popliteal vein as well as the left lower extremity nonocclusive thrombus noted in the left common femoral vein, femoral vein and popliteal vein and greater saphenous vein.  Vascular surgery was consulted to evaluate.  Past Medical History:  Diagnosis Date   Abnormal findings on diagnostic imaging of heart and coronary circulation    Anemia    FROM BLEEDING ULCER   Anxiety    takes Alprazolam daily as needed    Arthritis    dx with RA 2017   Bariatric surgery status    Cataract    Diverticulosis    Gastro-esophageal reflux disease with esophagitis    Gastrojejunal ulcer with hemorrhage    Headache(784.0)    occasionally   History of blood transfusion    no abnormal reaction noted   History of bronchitis    last time many yrs ago   Hyperlipidemia    PT DENIES THIS DX -  ON NO MEDS AND NO ONE HAS TOLD HER   Hypertension    takes Amlodipine daily   Hypothyroidism    takes Synthroid daily   Insomnia    Joint pain    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   Localized edema    Nocturia    Numbness    occasionally left arm at night   Obesity    Osteoporosis    takes Fosamax weekly   Pain in joint involving pelvic region and thigh    Peripheral edema    takes Lasix daily as needed   PONV (postoperative nausea and vomiting)    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/2017   Rheumatoid arthritis, unspecified (HCC)    Rotator cuff arthropathy, 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   Sleep apnea    Phreesia 08/22/2020   Stomach ulcer    Thyroid disease    Phreesia 08/22/2020   Unspecified injury of muscle(s) and tendon(s) of the rotator cuff of left shoulder, subsequent encounter    Unspecified osteoarthritis, unspecified site    Wears glasses  Wears partial dentures     Past Surgical History:  Procedure Laterality Date   ABDOMINAL HYSTERECTOMY     partial   cataract surgery Bilateral    CHOLECYSTECTOMY     COLONOSCOPY     ESOPHAGOGASTRODUODENOSCOPY (EGD) WITH PROPOFOL N/A 01/11/2022   Procedure: ESOPHAGOGASTRODUODENOSCOPY (EGD) WITH PROPOFOL;  Surgeon: Iva Boop, MD;  Location: [facility] ENDOSCOPY;  Service: Gastroenterology;  Laterality: N/A;   EYE SURGERY     CATARACTS BOTH   gastric bypass surgery     HOT HEMOSTASIS N/A 01/11/2022   Procedure: HOT HEMOSTASIS (ARGON PLASMA COAGULATION/BICAP);  Surgeon: Iva Boop, MD;  Location: [facility] ENDOSCOPY;  Service: Gastroenterology;   Laterality: N/A;   IR IMAGING GUIDED PORT INSERTION  05/23/2023   JOINT REPLACEMENT Bilateral    hip   REVERSE SHOULDER ARTHROPLASTY Right 05/11/2016   Procedure: REVERSE SHOULDER ARTHROPLASTY;  Surgeon: Teryl Lucy, MD;  Location: [facility] OR;  Service: Orthopedics;  Laterality: Right;   REVISION TOTAL HIP ARTHROPLASTY Left 10/03/2013   DR Turner Daniels   ROTATOR CUFF REPAIR     SCLEROTHERAPY  01/11/2022   Procedure: Susa Day;  Surgeon: Iva Boop, MD;  Location: [facility] ENDOSCOPY;  Service: Gastroenterology;;   TOTAL HIP REVISION Left 10/03/2013   Procedure: TOTAL HIP REVISION- left;  Surgeon: Nestor Lewandowsky, MD;  Location: [facility] OR;  Service: Orthopedics;  Laterality: Left;   TOTAL KNEE ARTHROPLASTY Left 11/29/2017   Procedure: LEFT TOTAL KNEE ARTHROPLASTY;  Surgeon: Teryl Lucy, MD;  Location: [facility] OR;  Service: Orthopedics;  Laterality: Left;   TOTAL SHOULDER ARTHROPLASTY Left 11/09/2016   Procedure: TOTAL REVERSE SHOULDER ARTHROPLASTY;  Surgeon: Teryl Lucy, MD;  Location: [facility] OR;  Service: Orthopedics;  Laterality: Left;   TOTAL SHOULDER REPLACEMENT Left 10/2016   TOTAL THYROIDECTOMY     UPPER GASTROINTESTINAL ENDOSCOPY      Allergies  Allergen Reactions   Macrobid [Nitrofurantoin] Other (See Comments)    Stomach pain    Prior to Admission medications   Medication Sig Start Date End Date Taking? Authorizing Provider  acyclovir (ZOVIRAX) 400 MG tablet Take 1 tablet (400 mg total) by mouth 2 (two) times daily. 04/26/23  Yes Doreatha Massed, MD  Adalimumab (HUMIRA PEN) 40 MG/0.4ML PNKT Inject 40 mg into the skin every Saturday.   Yes [provider]  furosemide (LASIX) 20 MG tablet Take 20 mg by mouth daily.   Yes [provider]  lenalidomide (REVLIMID) 10 MG capsule Take 1 capsule (10 mg total) by mouth daily. Take 10 mg by mouth for 21 days. Take 21 days on, 7 days off every 28 days 09/16/23  Yes Doreatha Massed, MD  levothyroxine (SYNTHROID) 137 MCG tablet  Take 0.5 tablets (68.5 mcg total) by mouth daily at 6 (six) AM. 05/19/23  Yes Marrion Coy, MD  midodrine (PROAMATINE) 2.5 MG tablet Take 3 tablets (7.5 mg total) by mouth 3 (three) times daily with meals. 06/19/23  Yes Tat, Onalee Hua, MD  naloxone [facility]) nasal spray 4 mg/0.1 mL Place 1 spray into the nose as needed (opioid reversal).   Yes [provider]  omeprazole (PRILOSEC) 40 MG capsule Take 1 capsule (40 mg total) by mouth daily before breakfast. Open capsule and swallow granules with liquid or applesauce 04/15/22  Yes Iva Boop, MD  oxybutynin (DITROPAN-XL) 10 MG 24 hr tablet Take 10 mg by mouth at bedtime. 03/16/23  Yes [provider]  traMADol (ULTRAM) 50 MG tablet Take 1 tablet (50 mg total) by mouth every 8 (eight) hours  as needed for moderate pain (pain score 4-6). 10/03/23  Yes Lurene Shadow, MD  acetaminophen (TYLENOL) 500 MG tablet Take 1 tablet (500 mg total) by mouth every 6 (six) hours as needed for mild pain, headache or fever (or Fever >/= 101). 03/16/23   Shon Hale, MD  aspirin EC 81 MG tablet Take 81 mg by mouth daily.    [provider]  calcium carbonate (TUMS - DOSED IN MG ELEMENTAL CALCIUM) 500 MG chewable tablet Chew 1 tablet (200 mg of elemental calcium total) by mouth 3 (three) times daily with meals. 06/19/23   Catarina Hartshorn, MD  ferrous sulfate 325 (65 FE) MG EC tablet Take 1 tablet (325 mg total) by mouth daily. 03/16/23   Shon Hale, MD  magnesium oxide (MAG-OX) 400 (240 Mg) MG tablet Take 1 tablet (400 mg total) by mouth daily. 06/20/23   Catarina Hartshorn, MD  Multiple Vitamin (MULTIVITAMIN WITH MINERALS) TABS tablet Take 1 tablet by mouth every morning.    [provider]  Omega-3 Fatty Acids (FISH OIL) 1000 MG CAPS Take 1 capsule by mouth daily. 03/17/23   [provider]  polyethylene glycol (MIRALAX / GLYCOLAX) 17 g packet Take 17 g by mouth daily. 04/13/23   Maurilio Lovely D, DO    Social History   Socioeconomic  History   Marital status: Divorced    Spouse name: Not on file   Number of children: 0   Years of education: Not on file   Highest education level: Not on file  Occupational History   Occupation: disabled  Tobacco Use   Smoking status: Never    Passive exposure: Never   Smokeless tobacco: Never  Vaping Use   Vaping status: Never Used  Substance and Sexual Activity   Alcohol use: No   Drug use: No   Sexual activity: Not Currently    Birth control/protection: Surgical  Other Topics Concern   Not on file  Social History Narrative   Lives alone : "at [facility] for rehab right now."   Social Drivers of Health   Financial Resource Strain: Low Risk  (09/14/2021)   Overall Financial Resource Strain (CARDIA)    Difficulty of Paying Living Expenses: Not hard at all  Food Insecurity: No Food Insecurity (09/30/2023)   Hunger Vital Sign    Worried About Running Out of Food in the Last Year: Never true    Ran Out of Food in the Last Year: Never true  Transportation Needs: No Transportation Needs (09/30/2023)   PRAPARE - Transportation    Lack of Transportation (Medical): No    Lack of Transportation (Non-Medical): No  Physical Activity: Sufficiently Active (09/14/2021)   Exercise Vital Sign    Days of Exercise per Week: 6 days    Minutes of Exercise per Session: 40 min  Stress: No Stress Concern Present (09/14/2021)   [employer] of Occupational Health - Occupational Stress Questionnaire    Feeling of Stress : Not at all  Social Connections: Moderately Integrated (09/30/2023)   Social Connection and Isolation Panel [NHANES]    Frequency of Communication with Friends and Family: Three times a week    Frequency of Social Gatherings with Friends and Family: More than three times a week    Attends Religious Services: 1 to 4 times per year    Active Member of [employer] or Organizations: Yes    Attends Banker Meetings: 1 to 4 times per year    Marital Status: Divorced   Intimate  Partner Violence: Not At Risk (09/30/2023)   Humiliation, Afraid, Rape, and Kick questionnaire    Fear of Current or Ex-Partner: No    Emotionally Abused: No    Physically Abused: No    Sexually Abused: No     Family History  Problem Relation Age of Onset   Diabetes Mother    Congestive Heart Failure Mother    Lung cancer Father    Thrombosis Sister    CAD Brother        CABG   Colon cancer Neg Hx    Esophageal cancer Neg Hx    Stomach cancer Neg Hx    Rectal cancer Neg Hx     ROS: Otherwise negative unless mentioned in HPI  Physical Examination  Vitals:   10/20/23 0330 10/20/23 0411  BP: 92/62 (!) 92/92  Pulse: 95 95  Resp: 20 19  Temp:  98.3 F (36.8 C)  SpO2:  100%   Body mass index is 31.53 kg/m.  General:  WDWN in NAD Gait: Not observed HENT: WNL, normocephalic Pulmonary: normal non-labored breathing, without Rales, rhonchi,  wheezing Cardiac: regular, Tachycardia in 120's, without  Murmurs, rubs or gallops; without carotid bruits Abdomen: Positive bowel Sounds throughout, soft, NT/ND, no masses Skin: without rashes Vascular Exam/Pulses: Palpable Pulses throughout. Extremities: without ischemic changes, without Gangrene , without cellulitis; without open wounds;  Musculoskeletal: no muscle wasting or atrophy  Neurologic: A&O X 3;  No focal weakness or paresthesias are detected; speech is fluent/normal Psychiatric:  The pt has Normal affect. Lymph:  Unremarkable  CBC    Component Value Date/Time   WBC 1.2 (LL) 10/20/2023 0151   RBC 2.70 (L) 10/20/2023 0151   HGB 8.7 (L) 10/20/2023 0151   HGB 10.2 (L) 07/19/2022 1421   HCT 25.3 (L) 10/20/2023 0151   HCT 30.2 (L) 07/19/2022 1421   PLT 48 (L) 10/20/2023 0151   PLT 223 07/19/2022 1421   MCV 93.7 10/20/2023 0151   MCV 97 07/19/2022 1421   MCH 32.2 10/20/2023 0151   MCHC 34.4 10/20/2023 0151   RDW 18.0 (H) 10/20/2023 0151   RDW 14.6 07/19/2022 1421   LYMPHSABS 1.0 10/19/2023 1758    LYMPHSABS 3.3 (H) 07/19/2022 1421   MONOABS 0.1 10/19/2023 1758   EOSABS 0.1 10/19/2023 1758   EOSABS 0.1 07/19/2022 1421   BASOSABS 0.0 10/19/2023 1758   BASOSABS 0.0 07/19/2022 1421    BMET    Component Value Date/Time   NA 141 10/20/2023 0151   NA 135 02/23/2023 1328   K 2.4 (LL) 10/20/2023 0151   CL 106 10/20/2023 0151   CO2 26 10/20/2023 0151   GLUCOSE 136 (H) 10/20/2023 0151   BUN 13 10/20/2023 0151   BUN 26 02/23/2023 1328   CREATININE 0.60 10/20/2023 0151   CREATININE 0.84 08/25/2020 1118   CALCIUM 6.3 (LL) 10/20/2023 0151   GFRNONAA >60 10/20/2023 0151   GFRNONAA 70 11/22/2019 0935   GFRAA 81 11/22/2019 0935    COAGS: Lab Results  Component Value Date   INR 1.5 (H) 10/19/2023   INR 1.1 05/12/2023   INR 1.6 (H) 04/06/2023     Non-Invasive Vascular Imaging:   EXAM:10/19/23 CT ANGIOGRAPHY CHEST WITH CONTRAST   TECHNIQUE: Multidetector CT imaging of the chest was performed using the standard protocol during bolus administration of intravenous contrast. Multiplanar CT image reconstructions and MIPs were obtained to evaluate the vascular anatomy.   RADIATION DOSE REDUCTION: This exam was performed according to the departmental dose-optimization program which includes  automated exposure control, adjustment of the mA and/or kV according to patient size and/or use of iterative reconstruction technique.   CONTRAST:  75mL OMNIPAQUE IOHEXOL 350 MG/ML SOLN   COMPARISON:  Chest radiograph 10/19/2023 and CT scan 03/11/2023   FINDINGS: Cardiovascular: Acute pulmonary embolus in the right and left pulmonary arteries and in their lobar and segmental branches. Clot burden is high. Right ventricular to left ventricular ratio 1.45 compatible with right heart dysfunction/at least submassive pulmonary embolus. Enlarged main pulmonary artery compatible pulmonary arterial hypertension.   There is a new moderate to large pericardial effusion compared to 03/11/2023  likewise not present on the CT abdomen from 05/11/2023.   Atherosclerotic aorta and branch vessels. Poor visualization of the circumflex coronary artery, possibly diminutive.   Right Port-A-Cath tip: Right atrium.   Mediastinum/Nodes: Mildly dilated and thick-walled mid and distal thoracic esophagus. Air fluid level in the esophagus, possibly from dysmotility or reflux.   Lungs/Pleura: Hazy peripheral ground-glass opacities in both lungs nonspecific in could be a manifestation of pulmonary hemorrhage, edema, atypical pneumonia, or acute hypersensitivity pneumonitis. Atelectasis in both lower lobes. Substantial airway plugging both lower lobes.   Upper Abdomen: Nonobstructive left nephrolithiasis. Postoperative findings in the stomach. Cholecystectomy.   Musculoskeletal: Bilateral verse shoulder arthroplasties. Various scattered speckled lucency throughout the bony structures likely manifestation of myeloma. Thoracic spondylosis. Deformity of the sternal body and manubrium likely related to prior fractures which may be pathologic, but not present on 03/11/2023. New anterior wedging at the T5 vertebral body compatible with new anterior wedge compression fracture. New superior endplate compression fracture at L2.   Review of the MIP images confirms the above findings.   IMPRESSION: 1. Acute pulmonary embolus in the right and left pulmonary arteries and in their lobar and segmental branches. Clot burden is high. Positive for acute PE with CT evidence of right heart strain (RV/LV Ratio = 1.45) consistent with at least submassive (intermediate risk) PE. The presence of right heart strain has been associated with an increased risk of morbidity and mortality. Please refer to the "Code PE Focused" order set in EPIC. 2. New moderate to large pericardial effusion compared to 03/11/2023 likewise not present on the CT abdomen from 05/11/2023. Tamponade not excluded, and the picture may be  difficult to sort on physical exam due to the pulmonary embolus with right heart dysfunction. If clinically warranted, echocardiography could be utilized for further characterization. 3. Hazy peripheral ground-glass opacities in both lungs nonspecific in could be a manifestation of pulmonary hemorrhage, edema, atypical pneumonia, or acute hypersensitivity pneumonitis. 4. Substantial airway plugging in both lower lobes with atelectasis in both lower lobes. 5. Mildly dilated and thick-walled mid and distal thoracic esophagus. Air fluid level in the esophagus, possibly from dysmotility or reflux. 6. New anterior wedge compression fracture at T5. New superior endplate compression fracture at L2. Partially healed new fractures of the sternal body and manubrium. 7.  Aortic Atherosclerosis (ICD10-I70.0).  Statin:  No. Beta Blocker:  No. Aspirin:  Yes.   ACEI:  No. ARB:  No. CCB use:  No Other antiplatelets/anticoagulants:  No.    ASSESSMENT/PLAN: This is a 83 y.o. female who presents to [facility] emergency department after a week of profound cough, shortness of breath, lightheadedness and abnormal labs with hypokalemia at 1.9.  Patient was started on a heparin infusion while in the emergency department.  Patient also was placed on high flow oxygen and receiving breathing treatments upon exam this morning.  Cardiology plans on completing an echocardiogram to  rule out emboli.  PLAN Vascular surgery recommends that the patient undergo a pulmonary thrombectomy with inferior vena cava filter placement due to the large clot burden in her lungs, lower extremity nonocclusive thrombus to bilateral lower extremities and an LV to RV ratio of 1.45 with heart strain. However the patient's extensive medical history and her current hypokalemia at 2.4 make her a very high risk candidate for having a life ending arrhythmia during the procedure.  Vascular surgery recommends correction of her electrolytes prior to  proceeding with both procedures.  Repeat labs are scheduled for 6 PM tonight and 5 AM tomorrow morning.  We plan on making the patient n.p.o. after midnight for procedure tomorrow morning if patient's electrolytes have been corrected.  We would prefer a potassium of 3.0 or above in order to do the procedure.  I did have a long detailed discussion with the patient at the bedside in the emergency department this morning concerning the procedure, benefits, risk, and complications.  Patient verbalized understanding and wishes to proceed with both pulmonary thrombectomy and inferior vena cava filter placement.  I answered all her questions this morning.  Patient will be made n.p.o. after midnight tonight for procedure tomorrow if electrolyte imbalances have been corrected.   -I discussed the case in detail with Dr Festus Barren MD and he agrees with the plan.    Marcie Bal [facility] 10/20/2023 7:19 AM

## 2023-10-20 NOTE — TOC Initial Note (Signed)
 Transition of Care [facility]) - Initial/Assessment Note    Patient Details  Name: Kathy [name] MRN: [id number] Date of Birth: [birth year]  Transition of Care [name]) CM/SW Contact:    Margarito Liner, LCSW Phone Number: 10/20/2023, 11:10 AM  Clinical Narrative:  CSW met with patient. No supports at bedside. CSW introduced role and explained that discharge planning would be discussed. Patient confirmed she is a long-term resident at [facility]. CSW left voicemail for the admissions coordinator. No further concerns. CSW will continue to follow patient for support and facilitate return to SNF once medically stable.                Expected Discharge Plan: Skilled Nursing Facility Barriers to Discharge: Continued Medical Work up   Patient Goals and CMS Choice            Expected Discharge Plan and Services     Post Acute Care Choice: Resumption of Svcs/PTA Provider Living arrangements for the past 2 months: Skilled Nursing Facility                                      Prior Living Arrangements/Services Living arrangements for the past 2 months: Skilled Nursing Facility Lives with:: Facility Resident Patient language and need for interpreter reviewed:: Yes Do you feel safe going back to the place where you live?: Yes      Need for Family Participation in Patient Care: Yes (Comment) Care giver support system in place?: Yes (comment)   Criminal Activity/Legal Involvement Pertinent to Current Situation/Hospitalization: No - Comment as needed  Activities of Daily Living      Permission Sought/Granted Permission sought to share information with : Facility Industrial/product designer granted to share information with : Yes, Verbal Permission Granted     Permission granted to share info w AGENCY: [facility]        Emotional Assessment Appearance:: Appears stated age Attitude/Demeanor/Rapport: Engaged, Gracious Affect (typically observed):  Accepting, Appropriate, Calm, Pleasant Orientation: : Oriented to Self, Oriented to Place, Oriented to  Time, Oriented to Situation Alcohol / Substance Use: Not Applicable Psych Involvement: No (comment)  Admission diagnosis:  Acute pulmonary embolism (HCC) [I26.99] Patient Active Problem List   Diagnosis Date Noted   Acute pulmonary embolism (HCC) 10/19/2023   DVT (deep venous thrombosis) (HCC) 10/19/2023   Pancytopenia (HCC) 10/19/2023   Pericardial effusion 10/19/2023   Myocardial injury 10/19/2023   Compression fracture of L2 (HCC) 10/19/2023   Compression fracture of T5 vertebra (HCC) 10/19/2023   Hypophosphatemia 10/19/2023   Fall at home, initial encounter 10/19/2023   Influenza A 10/19/2023   Hypoalbuminemia 09/30/2023   Hyperkalemia 09/30/2023   Gram-negative bacteremia 06/17/2023   Hypocalcemia 06/16/2023   Hypernatremia 05/18/2023   Protein-calorie malnutrition, severe 05/17/2023   Sepsis secondary to UTI (HCC) 05/12/2023   Electrolyte disturbance 05/12/2023   Thrombocytopenia (HCC) 05/12/2023   AMS (altered mental status) 05/12/2023   Multiple myeloma (HCC) 04/14/2023   Hypercalcemia of malignancy 04/07/2023   General weakness 04/07/2023   Recurrent falls 04/07/2023   Hypotension 04/07/2023   Hypoalbuminemia due to protein-calorie malnutrition (HCC) 04/07/2023   Acute prostatitis 04/07/2023   Chronic kidney disease, stage 3b (HCC) 04/07/2023   Dysphagia 04/07/2023   Acute pancreatitis 04/07/2023   History of thyroid cancer 04/07/2023   Anemia 03/30/2023   Physical deconditioning 03/30/2023   Elevated serum immunoglobulin free light chain level 03/30/2023  Leg swelling 02/21/2023   Hospital discharge follow-up 02/21/2023   Hypomagnesemia 02/21/2023   Urge incontinence of urine 02/21/2023   Hypercalcemia 01/23/2023   AKI (acute kidney injury) (HCC) 01/23/2023   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   Hammer toe of left foot 09/15/2022    Left foot pain 09/15/2022   Iron deficiency anemia due to chronic blood loss 07/19/2022   Rectal bleeding 01/21/2022   Gastrojejunal ulcer with hemorrhage    Encounter for general adult medical examination with abnormal findings 09/16/2021   Hypokalemia 09/16/2021   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   Bilateral impacted cerumen 11/19/2020   Hearing loss 11/19/2020   Malignant tumor of thyroid gland (HCC) 10/08/2020   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   Polyarthritis    Bariatric surgery status    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/2017   Rheumatoid arthritis (HCC) 11/16/2016   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   Rotator cuff arthropathy, 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   S/P shoulder replacement 05/11/2016   Allergic rhinitis 10/19/2013   GERD (gastroesophageal reflux disease) 10/19/2013   Osteoporosis    Essential hypertension    Acquired hypothyroidism    Hyperlipidemia    Anxiety    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 [place] [address] Phone: [phone] Fax: [phone]  [facility] - [place], [place] - [address] [place] [address] Phone: [phone] Fax: [phone]  [facility] - [place], [place] - [address] [phone] [facility] [address] [place] [address] Phone: [phone] Fax: [phone]     Social Drivers of Health (SDOH) Social History: SDOH Screenings   Food Insecurity: No Food Insecurity (09/30/2023)  Housing: Low Risk  (09/30/2023)  Transportation Needs: No Transportation Needs (09/30/2023)  Utilities: Not At Risk (09/30/2023)  Alcohol  Screen: Low Risk  (09/14/2021)  Depression (PHQ2-9): Low Risk  (03/30/2023)  Financial Resource Strain: Low Risk  (09/14/2021)  Physical Activity: Sufficiently Active (09/14/2021)  Social Connections: Moderately Integrated (09/30/2023)  Stress: No Stress Concern Present (09/14/2021)  Tobacco Use: Low Risk  (10/19/2023)   SDOH Interventions:     Readmission Risk Interventions    10/20/2023   11:10 AM 10/02/2023   10:16 AM 06/17/2023   11:10 AM  Readmission Risk Prevention Plan  Transportation Screening Complete Complete Complete  Medication Review Oceanographer) Complete Complete Complete  PCP or Specialist appointment within 3-5 days of discharge Complete Complete   HRI or Home Care Consult  Complete Complete  SW Recovery Care/Counseling Consult Complete Complete Complete  Palliative Care Screening Not Applicable Complete Not Applicable  Skilled Nursing Facility Complete Complete Complete

## 2023-10-20 NOTE — ED Notes (Signed)
 Pt repositioned onto left side. Call bell in hand.

## 2023-10-20 NOTE — Progress Notes (Signed)
 PHARMACY - ANTICOAGULATION CONSULT NOTE  Pharmacy Consult for heparin Indication: pulmonary embolus and DVT  Allergies  Allergen Reactions   Macrobid [Nitrofurantoin] Other (See Comments)    Stomach pain    Patient Measurements: Height: 5\' 1"  (154.9 cm) Weight: 75.7 kg (166 lb 14.2 oz) IBW/kg (Calculated) : 47.8 Heparin Dosing Weight: 64.5 kg  Vital Signs: Temp: 98.3 F (36.8 C) (03/20 0411) Temp Source: Oral (03/20 0411) BP: 92/92 (03/20 0411) Pulse Rate: 95 (03/20 0411)  Labs: Recent Labs    10/19/23 1758 10/20/23 0151  HGB 10.1* 8.7*  HCT 29.5* 25.3*  PLT 56* 48*  APTT 39*  --   LABPROT 18.1*  --   INR 1.5*  --   HEPARINUNFRC  --  0.44  CREATININE 0.77 0.60  TROPONINIHS 62* 50*    Estimated Creatinine Clearance: 51.4 mL/min (by C-G formula based on SCr of 0.6 mg/dL).   Medical History: Past Medical History:  Diagnosis Date   Abnormal findings on diagnostic imaging of heart and coronary circulation    Anemia    FROM BLEEDING ULCER   Anxiety    takes Alprazolam daily as needed   Arthritis    dx with RA 2017   Bariatric surgery status    Cataract    Diverticulosis    Gastro-esophageal reflux disease with esophagitis    Gastrojejunal ulcer with hemorrhage    Headache(784.0)    occasionally   History of blood transfusion    no abnormal reaction noted   History of bronchitis    last time many yrs ago   Hyperlipidemia    PT DENIES THIS DX -  ON NO MEDS AND NO ONE HAS TOLD HER   Hypertension    takes Amlodipine daily   Hypothyroidism    takes Synthroid daily   Insomnia    Joint pain    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   Localized edema    Nocturia    Numbness    occasionally left arm at night   Obesity    Osteoporosis    takes Fosamax weekly   Pain in joint involving pelvic region and thigh    Peripheral edema    takes Lasix daily as needed   PONV (postoperative nausea and vomiting)    Primary localized osteoarthritis of left  knee 11/29/2017   Rheumatoid arthritis, unspecified (HCC)    Rotator cuff arthropathy, 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   Sleep apnea    Phreesia 08/22/2020   Stomach ulcer    Thyroid disease    Phreesia 08/22/2020   Unspecified injury of muscle(s) and tendon(s) of the rotator cuff of left shoulder, subsequent encounter    Unspecified osteoarthritis, unspecified site    Wears glasses    Wears partial dentures    Assessment: 83 y/o female presenting with weakenss. PMH significant for  multiple myeloma, CKD, RA, chronic hypotension on midodrine, OSA, hypertension, hyperlipidemia, hypothyroidism. Imaging shows DVT in right femoral vein as well as submassive PE. Pharmacy has been consulted to initiate heparin infusion. Per chart review, patient was not on anticoagulation prior to admission.  Baseline labs: hgb 10.1, plt 56, INR pending  Platelets low at BL - discussed benefits outweigh risks with MD, will proceed with heparin infusion as patient has submassive PE and R DVT.  Goal of Therapy:  Heparin level 0.3-0.7 units/ml Monitor platelets by anticoagulation protocol: Yes  03/20 0151 HL 0.44, therapeutic x 1, Plt trending down slightly from 56 to 44   Plan:  Will continue heparin  infusion at 1100 units/hr Will recheck HL and Plt in 8 hrs  Thank you for involving pharmacy in this patient's care.   Otelia Sergeant, PharmD, [facility] 10/20/2023 6:26 AM

## 2023-10-21 ENCOUNTER — Inpatient Hospital Stay: Admitting: Anesthesiology

## 2023-10-21 ENCOUNTER — Encounter: Admission: EM | Disposition: E | Payer: Self-pay | Source: Skilled Nursing Facility | Attending: Hospitalist

## 2023-10-21 DIAGNOSIS — I2699 Other pulmonary embolism without acute cor pulmonale: Secondary | ICD-10-CM

## 2023-10-21 DIAGNOSIS — I2609 Other pulmonary embolism with acute cor pulmonale: Secondary | ICD-10-CM | POA: Diagnosis not present

## 2023-10-21 HISTORY — PX: IVC FILTER INSERTION: CATH118245

## 2023-10-21 HISTORY — PX: PULMONARY THROMBECTOMY: CATH118295

## 2023-10-21 LAB — BASIC METABOLIC PANEL
Anion gap: 10 (ref 5–15)
BUN: 13 mg/dL (ref 8–23)
CO2: 25 mmol/L (ref 22–32)
Calcium: 6.4 mg/dL — CL (ref 8.9–10.3)
Chloride: 108 mmol/L (ref 98–111)
Creatinine, Ser: 0.71 mg/dL (ref 0.44–1.00)
GFR, Estimated: >60 mL/min (ref >60)
Glucose, Bld: 95 mg/dL (ref 70–99)
Potassium: 3.7 mmol/L (ref 3.5–5.1)
Sodium: 143 mmol/L (ref 135–145)

## 2023-10-21 LAB — CBC
HCT: 22 % — ABNORMAL LOW (ref 36.0–46.0)
Hemoglobin: 8 g/dL — ABNORMAL LOW (ref 12.0–15.0)
MCH: 32.8 pg (ref 26.0–34.0)
MCHC: 36.4 g/dL — ABNORMAL HIGH (ref 30.0–36.0)
MCV: 90.2 fL (ref 80.0–100.0)
Platelets: 61 10*3/uL — ABNORMAL LOW (ref 150–400)
RBC: 2.44 MIL/uL — ABNORMAL LOW (ref 3.87–5.11)
RDW: 17.4 % — ABNORMAL HIGH (ref 11.5–15.5)
WBC: 1.3 10*3/uL — CL (ref 4.0–10.5)
nRBC: 1.6 % — ABNORMAL HIGH (ref 0.0–0.2)

## 2023-10-21 LAB — GLUCOSE, CAPILLARY: Glucose-Capillary: 90 mg/dL (ref 70–99)

## 2023-10-21 LAB — HEMOGLOBIN AND HEMATOCRIT, BLOOD
HCT: 22.5 % — ABNORMAL LOW (ref 36.0–46.0)
Hemoglobin: 8.1 g/dL — ABNORMAL LOW (ref 12.0–15.0)

## 2023-10-21 LAB — ALBUMIN: Albumin: 2.6 g/dL — ABNORMAL LOW (ref 3.5–5.0)

## 2023-10-21 LAB — PREPARE RBC (CROSSMATCH)

## 2023-10-21 LAB — HEPARIN LEVEL (UNFRACTIONATED): Heparin Unfractionated: 0.14 [IU]/mL — ABNORMAL LOW (ref 0.30–0.70)

## 2023-10-21 LAB — MAGNESIUM: Magnesium: 2 mg/dL (ref 1.7–2.4)

## 2023-10-21 SURGERY — PULMONARY THROMBECTOMY
Anesthesia: General

## 2023-10-21 MED ORDER — CEFAZOLIN SODIUM-DEXTROSE 2-4 GM/100ML-% IV SOLN
INTRAVENOUS | Status: AC
  Filled 2023-10-21: qty 100

## 2023-10-21 MED ORDER — KETAMINE HCL 50 MG/5ML IJ SOSY
PREFILLED_SYRINGE | INTRAMUSCULAR | Status: DC | PRN
  Administered 2023-10-21: 20 mg via INTRAVENOUS

## 2023-10-21 MED ORDER — LIDOCAINE HCL (CARDIAC) PF 100 MG/5ML IV SOSY
PREFILLED_SYRINGE | INTRAVENOUS | Status: DC | PRN
  Administered 2023-10-21: 40 mg via INTRAVENOUS

## 2023-10-21 MED ORDER — GLYCOPYRROLATE 0.2 MG/ML IJ SOLN
INTRAMUSCULAR | Status: AC
Start: 2023-10-21 — End: ?
  Filled 2023-10-21: qty 1

## 2023-10-21 MED ORDER — CEFAZOLIN SODIUM-DEXTROSE 2-4 GM/100ML-% IV SOLN
2.0000 g | INTRAVENOUS | Status: AC
  Administered 2023-10-21: 2 g via INTRAVENOUS

## 2023-10-21 MED ORDER — PHENYLEPHRINE 80 MCG/ML (10ML) SYRINGE FOR IV PUSH (FOR BLOOD PRESSURE SUPPORT)
PREFILLED_SYRINGE | INTRAVENOUS | Status: DC | PRN
Start: 2023-10-21 — End: 2023-10-21
  Administered 2023-10-21: 160 ug via INTRAVENOUS
  Administered 2023-10-21: 80 ug via INTRAVENOUS

## 2023-10-21 MED ORDER — DEXAMETHASONE SODIUM PHOSPHATE 10 MG/ML IJ SOLN
INTRAMUSCULAR | Status: AC
Start: 2023-10-21 — End: ?
  Filled 2023-10-21: qty 1

## 2023-10-21 MED ORDER — PROPOFOL 500 MG/50ML IV EMUL
INTRAVENOUS | Status: DC | PRN
  Administered 2023-10-21: 75 ug/kg/min via INTRAVENOUS

## 2023-10-21 MED ORDER — PROPOFOL 1000 MG/100ML IV EMUL
INTRAVENOUS | Status: AC
  Filled 2023-10-21: qty 100

## 2023-10-21 MED ORDER — HEPARIN BOLUS VIA INFUSION
1900.0000 [IU] | Freq: Once | INTRAVENOUS | Status: AC
  Administered 2023-10-21: 1900 [IU] via INTRAVENOUS
  Filled 2023-10-21: qty 1900

## 2023-10-21 MED ORDER — HEPARIN (PORCINE) IN NACL 1000-0.9 UT/500ML-% IV SOLN
INTRAVENOUS | Status: DC | PRN
  Administered 2023-10-21: 1000 mL

## 2023-10-21 MED ORDER — K PHOS MONO-SOD PHOS DI & MONO 155-852-130 MG PO TABS
500.0000 mg | ORAL_TABLET | ORAL | Status: DC
  Filled 2023-10-21: qty 2

## 2023-10-21 MED ORDER — K PHOS MONO-SOD PHOS DI & MONO 155-852-130 MG PO TABS
500.0000 mg | ORAL_TABLET | Freq: Once | ORAL | Status: AC
  Administered 2023-10-21: 500 mg via ORAL
  Filled 2023-10-21: qty 2

## 2023-10-21 MED ORDER — KETAMINE HCL 50 MG/5ML IJ SOSY
PREFILLED_SYRINGE | INTRAMUSCULAR | Status: AC
  Filled 2023-10-21: qty 5

## 2023-10-21 MED ORDER — POTASSIUM CHLORIDE CRYS ER 20 MEQ PO TBCR
40.0000 meq | EXTENDED_RELEASE_TABLET | Freq: Once | ORAL | Status: AC
  Administered 2023-10-21: 40 meq via ORAL
  Filled 2023-10-21: qty 2

## 2023-10-21 MED ORDER — SODIUM CHLORIDE 0.9% IV SOLUTION: Freq: Once | INTRAVENOUS | Status: AC

## 2023-10-21 MED ORDER — MIDAZOLAM HCL 2 MG/ML PO SYRP: 8.0000 mg | ORAL_SOLUTION | Freq: Once | ORAL | Status: DC | PRN

## 2023-10-21 MED ORDER — GLYCOPYRROLATE 0.2 MG/ML IJ SOLN
INTRAMUSCULAR | Status: DC | PRN
  Administered 2023-10-21: .1 mg via INTRAVENOUS

## 2023-10-21 MED ORDER — DEXAMETHASONE SODIUM PHOSPHATE 10 MG/ML IJ SOLN
INTRAMUSCULAR | Status: DC | PRN
  Administered 2023-10-21: 10 mg via INTRAVENOUS

## 2023-10-21 MED ORDER — CALCIUM GLUCONATE-NACL 1-0.675 GM/50ML-% IV SOLN
1.0000 g | Freq: Once | INTRAVENOUS | Status: AC
  Administered 2023-10-21: 1000 mg via INTRAVENOUS
  Filled 2023-10-21: qty 50

## 2023-10-21 MED ORDER — HEPARIN SODIUM (PORCINE) 1000 UNIT/ML IJ SOLN
INTRAMUSCULAR | Status: AC
  Filled 2023-10-21: qty 10

## 2023-10-21 MED ORDER — FENTANYL CITRATE PF 50 MCG/ML IJ SOSY: 12.5000 ug | PREFILLED_SYRINGE | Freq: Once | INTRAMUSCULAR | Status: DC | PRN

## 2023-10-21 MED ORDER — IODIXANOL 320 MG/ML IV SOLN
INTRAVENOUS | Status: DC | PRN
  Administered 2023-10-21: 65 mL

## 2023-10-21 MED ORDER — PHENYLEPHRINE 80 MCG/ML (10ML) SYRINGE FOR IV PUSH (FOR BLOOD PRESSURE SUPPORT)
PREFILLED_SYRINGE | INTRAVENOUS | Status: AC
  Filled 2023-10-21: qty 10

## 2023-10-21 MED ORDER — LIDOCAINE-EPINEPHRINE (PF) 1 %-1:200000 IJ SOLN
INTRAMUSCULAR | Status: DC | PRN
  Administered 2023-10-21: 10 mL

## 2023-10-21 MED ORDER — IPRATROPIUM-ALBUTEROL 0.5-2.5 (3) MG/3ML IN SOLN
3.0000 mL | Freq: Four times a day (QID) | RESPIRATORY_TRACT | Status: DC | PRN
Start: 2023-10-21 — End: 2023-10-23

## 2023-10-21 MED ORDER — METHYLPREDNISOLONE SODIUM SUCC 125 MG IJ SOLR: 125.0000 mg | Freq: Once | INTRAMUSCULAR | Status: DC | PRN

## 2023-10-21 MED ORDER — HEPARIN SODIUM (PORCINE) 1000 UNIT/ML IJ SOLN
INTRAMUSCULAR | Status: DC | PRN
  Administered 2023-10-21: 3000 [IU] via INTRAVENOUS

## 2023-10-21 MED ORDER — PHENYLEPHRINE HCL-NACL 20-0.9 MG/250ML-% IV SOLN
INTRAVENOUS | Status: AC
  Filled 2023-10-21: qty 250

## 2023-10-21 MED ORDER — FAMOTIDINE 20 MG PO TABS: 40.0000 mg | ORAL_TABLET | Freq: Once | ORAL | Status: DC | PRN

## 2023-10-21 MED ORDER — FENTANYL CITRATE (PF) 100 MCG/2ML IJ SOLN
INTRAMUSCULAR | Status: AC
  Filled 2023-10-21: qty 2

## 2023-10-21 SURGICAL SUPPLY — 17 items
CANISTER PENUMBRA ENGINE (MISCELLANEOUS) IMPLANT
CATH ANGIO 5F PIGTAIL 100CM (CATHETERS) IMPLANT
CATH INDIGO 12XTORQ 100 (CATHETERS) IMPLANT
CATH INDIGO SEP 12 (CATHETERS) IMPLANT
CATH SELECT BERN TIP 5F 130 (CATHETERS) IMPLANT
CLOSURE PERCLOSE PROSTYLE (VASCULAR PRODUCTS) IMPLANT
GLIDEWIRE ADV .035X180CM (WIRE) IMPLANT
INTRODUCER PERFORM 12FR X13 (SHEATH) IMPLANT
KIT FEM OPTION ELITE FILTER (Filter) IMPLANT
PACK ANGIOGRAPHY (CUSTOM PROCEDURE TRAY) ×3 IMPLANT
SHEATH BRITE TIP 6FRX11 (SHEATH) IMPLANT
SUT MNCRL AB 4-0 PS2 18 (SUTURE) IMPLANT
SYR MEDRAD MARK 7 150ML (SYRINGE) IMPLANT
TUBING CONTRAST HIGH PRESS 72 (TUBING) IMPLANT
VALVE HEMO TOUHY BORST Y (ADAPTER) IMPLANT
WIRE J 3MM .035X145CM (WIRE) IMPLANT
WIRE SUPRACORE 300CM (WIRE) IMPLANT

## 2023-10-21 NOTE — TOC Progression Note (Signed)
 Transition of Care [facility]) - Progression Note    Patient Details  Name: Kathy [name] MRN: [id number] Date of Birth: 1941-01-09  [facility]) CM/SW Contact  Garret Reddish, RN Phone Number: 10/21/2023, 6:10 PM  Clinical Narrative:    I have spoken with [facility] Admission Coordinator with [facility].  She informs me that patient is able to return to [facility] on is discharge.  Tobi Bastos reports that [name] is long term care  resident at the facility.    TOC will continue to follow for discharge planning.     Expected Discharge Plan: Skilled Nursing Facility Barriers to Discharge: Continued Medical Work up  Expected Discharge Plan and Services     Post Acute Care Choice: Resumption of Svcs/PTA Provider Living arrangements for the past 2 months: Skilled Nursing Facility                                       Social Determinants of Health (SDOH) Interventions SDOH Screenings   Food Insecurity: No Food Insecurity (10/20/2023)  Housing: Low Risk  (10/20/2023)  Transportation Needs: No Transportation Needs (10/20/2023)  Utilities: Not At Risk (10/20/2023)  Alcohol Screen: Low Risk  (09/14/2021)  Depression (PHQ2-9): Low Risk  (03/30/2023)  Financial Resource Strain: Low Risk  (09/14/2021)  Physical Activity: Sufficiently Active (09/14/2021)  Social Connections: Moderately Integrated (09/30/2023)  Stress: No Stress Concern Present (09/14/2021)  Tobacco Use: Low Risk  (10/19/2023)    Readmission Risk Interventions    10/20/2023   11:10 AM 10/02/2023   10:16 AM 06/17/2023   11:10 AM  Readmission Risk Prevention Plan  Transportation Screening Complete Complete Complete  Medication Review Oceanographer) Complete Complete Complete  PCP or Specialist appointment within 3-5 days of discharge Complete Complete   HRI or Home Care Consult  Complete Complete  SW Recovery Care/Counseling Consult Complete Complete Complete  Palliative Care Screening Not  Applicable Complete Not Applicable  Skilled Nursing Facility Complete Complete Complete

## 2023-10-21 NOTE — Progress Notes (Signed)
 PHARMACY CONSULT NOTE - ELECTROLYTES  Pharmacy Consult for Electrolyte Monitoring and Replacement   Recent Labs: Height: 5\' 2"  (157.5 cm) Weight: 58.9 kg (129 lb 13.6 oz) IBW/kg (Calculated) : 50.1 Estimated Creatinine Clearance: 43.6 mL/min (by C-G formula based on SCr of 0.71 mg/dL). Potassium (mmol/L)  Date Value  10/21/2023 3.7   Magnesium (mg/dL)  Date Value  16/05/9603 2.0   Calcium (mg/dL)  Date Value  54/04/8118 6.4 (LL)   Albumin (g/dL)  Date Value  14/78/2956 2.6 (L)  03/18/2021 3.2 (L)   Phosphorus (mg/dL)  Date Value  21/30/8657 2.2 (L)   Sodium (mmol/L)  Date Value  10/21/2023 143  02/23/2023 135   Corrected Ca: 7.56 mg/dL  Assessment  Kathy [name] is a 84 y.o. female presenting with weakness. PMH significant for multiple myeloma, CKD, RA, chronic hypotension on midodrine, OSA, hypertension, hyperlipidemia, hypothyroidism. Pharmacy has been consulted to monitor and replace electrolytes.  Goal of Therapy: Electrolytes WNL  Plan:  Phos 2.2: Give Kphos Neutral 500 mg po x 1 Corrected Ca 7.56: Calcium gluconate 2g IV x 1 Check BMP, Mg, Phos with AM labs  Thank you for allowing pharmacy to be a part of this patient's care.  [name], PharmD Clinical Pharmacist 10/21/2023 9:01 AM

## 2023-10-21 NOTE — Op Note (Signed)
 [facility]  Percutaneous Study/Intervention Procedural Note   Date of Surgery: 10/21/2023,2:32 PM  Surgeon: Festus Barren  Pre-operative Diagnosis: Symptomatic bilateral pulmonary emboli  Post-operative diagnosis:  Same  Procedure(s) Performed:  1.  Contrast injection right heart  2.  Mechanical thrombectomy to right lower lobe, middle lobe, and upper lobe pulmonary arteries and to left upper lobe and lower lobe pulmonary arteries  3.  Selective catheter placement left lower and upper lobe pulmonary arteries  4.  Selective catheter placement right lower lobe, middle lobe and upper lobe pulmonary arteries  5.  IVC filter placement      Anesthesia: MAC  EBL: 325 cc  Sheath: 12 Fr right femoral vein  Contrast: 65 cc   Fluoroscopy Time: 7.6 minutes  Indications:  Patient presents with pulmonary emboli. The patient is symptomatic with hypoxemia and dyspnea on exertion.  There is evidence of right heart strain on the CT angiogram. The patient is otherwise a good candidate for intervention and even the long-term benefits pulmonary angiography with thrombolysis is offered. The risks and benefits are reviewed long-term benefits are discussed. All questions are answered patient agrees to proceed.  Procedure:  Kathy [name] a 83 y.o. female who was identified and appropriate procedural time out was performed.  The patient was then placed supine on the table and prepped and draped in the usual sterile fashion.  Ultrasound was used to evaluate the right common femoral vein.   A digital ultrasound image was acquired for the permanent record.  A Seldinger needle was used to access the right common femoral vein under direct ultrasound guidance.  A 0.035 J wire was advanced without resistance and a 6Fr sheath was placed. A proglide device was placed in a preclose fashion and then upsized to a 12 [place] sheath.    The Advantage wire and pigtail catheter were then  negotiated into the right atrium and bolus injection of contrast was utilized to demonstrate the right ventricle and the pulmonary artery outflow. The Advantage wire and catheter were then negotiated into the the pulmonary arteries.  The left pulmonary artery was cannulated and advanced into the left upper lobe and left lower lobe for selective imaging. This demonstrated significant thrombus burden most notably at the origin of the left upper lobe pulmonary artery and the left lower lobe pulmonary arteries.  I then transitioned to the right side with the advantage wire and catheter and first cannulated the right lower lobe and then the right middle and upper lobes.  This demonstrated significant thrombus burden in the right middle and upper lobe pulmonary arteries with small amount of thrombus in the right lower lobe pulmonary artery.  Of note, the image quality was extremely poor due to continuous patient coughing and cardiopulmonary motion.  3000 units of heparin was then given and allowed to circulate.   The Penumbra Cat 12 catheter was then advanced up into the pulmonary vasculature. The right lung was addressed first. Catheter was negotiated into the right middle lobe and then right upper lobe pulmonary artery and mechanical thrombectomy was performed with the help of the separator in both vessels. Follow-up imaging demonstrated a good result and therefore the catheter was renegotiated into the right lower lobe pulmonary artery and again mechanical thrombectomy was performed. Passes were made with both the Penumbra catheter itself as well as introducing the separator. Follow-up imaging was then performed.  The Penumbra Cat 12 catheter was then negotiated to the opposite side. The left lung was  then addressed. Catheter was negotiated into the left upper lobe and mechanical thrombectomy was performed with the separator. Follow-up imaging demonstrated a good result and therefore the catheter was  renegotiated into the left lower lobe pulmonary artery and again mechanical thrombectomy was performed. Passes were made with both the Penumbra catheter itself as well as introducing the separator. Follow-up imaging was then performed.  I then terminated the pulmonary thrombectomy and turned my attention to placing the IVC filter.  Imaging was performed to the right femoral venous sheath to evaluate the iliac veins and IVC.  They were patent.  The IVC appeared to have renal veins that took off about the upper of L1 and L2.  I then placed the IVC filter at the level of the bottom of L2.  This was an argon option Elite IVC filter.  This was delivered over the advantage wire.  Follow-up with the delivery system was then removed.  After review these images wires were reintroduced and the catheter is removed. Then, the sheath is then pulled, the proglide device is secured, a Monocryl skin suture was placed and pressure is held. A sterile dressing is placed.    Findings:   Right heart imaging:  Right atrium and right ventricle and the pulmonary outflow tract appears somewhat dilated  Right lung:  This demonstrated significant thrombus burden in the right middle and upper lobe pulmonary arteries with small amount of thrombus in the right lower lobe pulmonary artery.    Left lung:  This demonstrated significant thrombus burden most notably at the origin of the left upper lobe pulmonary artery and the left lower lobe pulmonary arteries.    Disposition: Patient was taken to the recovery room in stable condition having tolerated the procedure well.  Festus Barren 10/21/2023,2:32 PM

## 2023-10-21 NOTE — Progress Notes (Signed)
 PRBCs started at 1156. Report given to specials and pt taken to special procedures at 1200. VSS prior to transport.

## 2023-10-21 NOTE — Transfer of Care (Signed)
 Immediate Anesthesia Transfer of Care Note  Patient: Kathy [name]  Procedure(s) Performed: PULMONARY THROMBECTOMY (Bilateral) IVC FILTER INSERTION  Patient Location: ICU  Anesthesia Type:MAC  Level of Consciousness: awake  Airway & Oxygen Therapy: Patient Spontanous Breathing and Patient connected to face mask oxygen  Post-op Assessment: Report given to RN and Post -op Vital signs reviewed and stable  Post vital signs: Reviewed and stable  Last Vitals:  Vitals Value Taken Time  BP    Temp    Pulse    Resp    SpO2      Last Pain:  Vitals:   10/21/23 1210  TempSrc:   PainSc: 5       Patients Stated Pain Goal: 0 (10/19/23 1738)  Complications:  Encounter Notable Events  Notable Event Outcome Phase Comment  None  Intraprocedure

## 2023-10-21 NOTE — Progress Notes (Signed)
       CROSS COVER NOTE  NAME: Kathy [name] MRN: [id number] DOB : 04-14-1941 ATTENDING PHYSICIAN: Darlin Priestly, MD    Date of Service   10/21/2023   HPI/Events of Note   Am lab results  Latest Reference Range & Units 10/21/23 03:40  Sodium 135 - 145 mmol/L 143  Potassium 3.5 - 5.1 mmol/L 3.7  Chloride 98 - 111 mmol/L 108  CO2 22 - 32 mmol/L 25  Glucose 70 - 99 mg/dL 95  BUN 8 - 23 mg/dL 13  Creatinine 5.28 - 4.13 mg/dL 2.44  Calcium 8.9 - 01.0 mg/dL 6.4 (LL)  Anion gap 5 - 15  10  Magnesium 1.7 - 2.4 mg/dL 2.0  (LL): Data is critically low  Interventions   Assessment/Plan: Corrected calcium 7,5 Additional 1 gm calcium gluconate IV 40 meQ Kdur oral x1      Donnie [place] NP [facility] Cross Cover 7pm-7am - check amion for availability Pager [phone]

## 2023-10-21 NOTE — Progress Notes (Signed)
 PHARMACY - ANTICOAGULATION CONSULT NOTE  Pharmacy Consult for heparin Indication: pulmonary embolus and DVT  Allergies  Allergen Reactions   Macrobid [Nitrofurantoin] Other (See Comments)    Stomach pain    Patient Measurements: Height: 5\' 2"  (157.5 cm) Weight: 58.9 kg (129 lb 13.6 oz) IBW/kg (Calculated) : 50.1 Heparin Dosing Weight: 64.5 kg  Vital Signs: Temp: 99.8 F (37.7 C) (03/21 0200) Temp Source: Oral (03/21 0200) BP: 91/68 (03/21 0345) Pulse Rate: 105 (03/21 0400)  Labs: Recent Labs    10/19/23 1758 10/20/23 0151 10/20/23 1026 10/20/23 2137 10/21/23 0340  HGB 10.1* 8.7*  --   --  8.0*  HCT 29.5* 25.3*  --   --  22.0*  PLT 56* 48* 53*  --  61*  APTT 39*  --   --   --   --   LABPROT 18.1*  --   --   --   --   INR 1.5*  --   --   --   --   HEPARINUNFRC  --  0.44 0.35  --  0.14*  CREATININE 0.77 0.60  --  0.68 0.71  TROPONINIHS 62* 50*  --   --   --     Estimated Creatinine Clearance: 43.6 mL/min (by C-G formula based on SCr of 0.71 mg/dL).   Medical History: Past Medical History:  Diagnosis Date   Abnormal findings on diagnostic imaging of heart and coronary circulation    Anemia    FROM BLEEDING ULCER   Anxiety    takes Alprazolam daily as needed   Arthritis    dx with RA 2017   Bariatric surgery status    Cataract    Chronic heart failure with preserved ejection fraction (HFpEF) (HCC)    a. 11/2016 Echo: EF 55-60%, Gr1 DD, no rwma; b. 03/2022 Echo: EF 60-65%, no rwma, nl RV fxn, mildly elev RVSP, mild-mod dil LA, mildly dil RA, mild MR, mild-mod TR.   Diverticulosis    Gastro-esophageal reflux disease with esophagitis    Gastrojejunal ulcer with hemorrhage    Headache(784.0)    occasionally   History of blood transfusion    no abnormal reaction noted   History of bronchitis    last time many yrs ago   Hypothyroidism    takes Synthroid daily   Insomnia    Joint pain    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   Localized edema     Nocturia    Numbness    occasionally left arm at night   Obesity    Osteoporosis    takes Fosamax weekly   Pain in joint involving pelvic region and thigh    Peripheral edema    takes Lasix daily as needed   PONV (postoperative nausea and vomiting)    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/2017   Rheumatoid arthritis, unspecified (HCC)    Rotator cuff arthropathy, 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   Sleep apnea    Stomach ulcer    Unspecified injury of muscle(s) and tendon(s) of the rotator cuff of left shoulder, subsequent encounter    Unspecified osteoarthritis, unspecified site    Wears glasses    Wears partial dentures    Assessment: 83 y/o female presenting with weakenss. PMH significant for  multiple myeloma, CKD, RA, chronic hypotension on midodrine, OSA, hypertension, hyperlipidemia, hypothyroidism. Imaging shows DVT in right femoral vein as well as submassive PE. Pharmacy has been consulted to initiate heparin infusion. Per chart review, patient was not on anticoagulation prior  to admission.  Baseline labs: hgb 10.1, plt 56, INR pending  Platelets low at BL - discussed benefits outweigh risks with MD, will proceed with heparin infusion as patient has submassive PE and R DVT.  Goal of Therapy:  Heparin level 0.3-0.7 units/ml Monitor platelets by anticoagulation protocol: Yes  03/20 0151 HL 0.44, therapeutic x 1, Plt trending down slightly from 56 to 44 03/20 1026 HL 0.35, therapeutic x 2, PLT stable 03/20 0340 HL 0.14, subtherapeutic, PLT stable at 61, Hgb trending down   Plan:  Will bolus 1900 units x 1 Increase heparin infusion to 1250 units/hr Recheck HL in 8 hrs after rate change Monitor CBC daily   Thank you for involving pharmacy in this patient's care.   Otelia Sergeant, PharmD, [facility] 10/21/2023 6:46 AM

## 2023-10-21 NOTE — Progress Notes (Signed)
 Progress Note    10/21/2023 8:14 AM * No surgery found *  Subjective:  Kathy [name] is an 83 yo female who presents to [facility] emergency department yesterday on 10/20/2023 with increased shortness of breath, lightheadedness and abnormal labs with hypokalemia of 1.9.  Upon workup she underwent a CTA of the chest which revealed large clot burden with bilateral pulmonary embolisms and right heart strain with an RV to LV ratio of 1.45.  Etiology completed an echocardiogram which endorses a small amount of pericardial fluid but otherwise normal ejection fraction and should be able to withstand a pulmonary thrombectomy once electrolytes are corrected.  Patient rested comfortably overnight.  On exam this morning she remains with labored breathing and diminished breath sounds throughout bilateral lungs.  She appears to be uncomfortable with this type of breathing.  Her vital signs however are better this morning.  On 2 L nasal cannula oxygen she was 97% saturation.  However the patient does endorse feeling very short of breath.  No other complaints overnight and vitals remained stable.   Vitals:   10/21/23 0700 10/21/23 0723  BP: (!) 88/54   Pulse: 99 95  Resp: 18 20  Temp:    SpO2: 98% 95%   Physical Exam: Cardiac:  RRR with Tachycardia. Normal S1, S2. No murmurs present. Lungs:  Labored breathing. Moving air but diminished throughout.  Incisions:  None  Extremities:  weak palpable pulses throughout. Extremities warm to touch  Abdomen:  Positive bowel sounds, soft, non tender and non distended.  Neurologic: AAOX3 answers questions and follows commands.   CBC    Component Value Date/Time   WBC 1.3 (LL) 10/21/2023 0340   RBC 2.44 (L) 10/21/2023 0340   HGB 8.0 (L) 10/21/2023 0340   HGB 10.2 (L) 07/19/2022 1421   HCT 22.0 (L) 10/21/2023 0340   HCT 30.2 (L) 07/19/2022 1421   PLT 61 (L) 10/21/2023 0340   PLT 223 07/19/2022 1421   MCV 90.2 10/21/2023 0340   MCV 97 07/19/2022 1421   MCH  32.8 10/21/2023 0340   MCHC 36.4 (H) 10/21/2023 0340   RDW 17.4 (H) 10/21/2023 0340   RDW 14.6 07/19/2022 1421   LYMPHSABS 1.0 10/19/2023 1758   LYMPHSABS 3.3 (H) 07/19/2022 1421   MONOABS 0.1 10/19/2023 1758   EOSABS 0.1 10/19/2023 1758   EOSABS 0.1 07/19/2022 1421   BASOSABS 0.0 10/19/2023 1758   BASOSABS 0.0 07/19/2022 1421    BMET    Component Value Date/Time   NA 143 10/21/2023 0340   NA 135 02/23/2023 1328   K 3.7 10/21/2023 0340   CL 108 10/21/2023 0340   CO2 25 10/21/2023 0340   GLUCOSE 95 10/21/2023 0340   BUN 13 10/21/2023 0340   BUN 26 02/23/2023 1328   CREATININE 0.71 10/21/2023 0340   CREATININE 0.84 08/25/2020 1118   CALCIUM 6.4 (LL) 10/21/2023 0340   GFRNONAA >60 10/21/2023 0340   GFRNONAA 70 11/22/2019 0935   GFRAA 81 11/22/2019 0935    INR    Component Value Date/Time   INR 1.5 (H) 10/19/2023 1758     Intake/Output Summary (Last 24 hours) at 10/21/2023 0814 Last data filed at 10/21/2023 0730 Gross per 24 hour  Intake 927.2 ml  Output 400 ml  Net 527.2 ml     Assessment/Plan:  83 y.o. female presents to [facility] emergency department with shortness of breath.  Diagnosed with bilateral pulmonary embolisms.  Patient with heavy clot burden and RV to LV ratio 1.45.  Patient continues  to have labored breathing.  Electrolytes were repleted over the last 24 hours and her hypokalemia is now resolved with a potassium this morning of 3.7.  Serum remains low at 6.2.* No surgery found *   PLAN Therefore vascular surgery will plan on taking the patient to the vascular lab this afternoon on 10/21/2023 for a pulmonary thrombectomy and an IVC filter placement.  I had a long detailed discussion again this morning at the bedside regarding the procedure, benefits, risk, and complications.  Patient verbalizes her understanding and wishes to proceed today.  Patient has been n.p.o. since midnight last night.  Patient has been on a heparin infusion since entering the emergency  room yesterday.  DVT prophylaxis: Heparin infusion  I discussed the case in detail with Dr. Festus Barren MD and he agrees with the plan.   Marcie Bal [facility] 10/21/2023 8:14 AM

## 2023-10-21 NOTE — Anesthesia Preprocedure Evaluation (Addendum)
 Anesthesia Evaluation  Patient identified by MRN, date of birth, ID band Patient awake    Reviewed: Allergy & Precautions, NPO status , Patient's Chart, lab work & pertinent test results  History of Anesthesia Complications (+) PONV and history of anesthetic complications  Airway Mallampati: II   Neck ROM: Full    Dental  (+) Upper Dentures, Lower Dentures   Pulmonary sleep apnea  Flu A; pericardial effusion   Pulmonary exam normal breath sounds clear to auscultation       Cardiovascular Normal cardiovascular exam Rhythm:Regular Rate:Normal  Hypotension on midodrine   ECG 10/19/23:  Sinus tachycardia (HR 103) Multiform ventricular premature complexes Right bundle branch block Nonspecific T abnormalities, lateral leads   Neuro/Psych  Headaches PSYCHIATRIC DISORDERS Anxiety        GI/Hepatic PUD,GERD  ,,  Endo/Other  Hypothyroidism    Renal/GU Renal disease (stage III CKD)     Musculoskeletal  (+) Arthritis , Rheumatoid disorders,    Abdominal   Peds  Hematology  (+) Blood dyscrasia (pancytopenia, including plt count 61), anemia Multiple myeloma on chemotherapy   Anesthesia Other Findings   Reproductive/Obstetrics                             Anesthesia Physical Anesthesia Plan  ASA: 4  Anesthesia Plan: General   Post-op Pain Management:    Induction: Intravenous  PONV Risk Score and Plan: 4 or greater and Propofol infusion, TIVA, Treatment may vary due to age or medical condition and Ondansetron  Airway Management Planned: Natural Airway  Additional Equipment:   Intra-op Plan:   Post-operative Plan:   Informed Consent: I have reviewed the patients History and Physical, chart, labs and discussed the procedure including the risks, benefits and alternatives for the proposed anesthesia with the patient or authorized representative who has indicated his/her understanding and  acceptance.   Patient has DNR.  Discussed DNR with patient and Continue DNR.     Plan Discussed with: CRNA  Anesthesia Plan Comments: (LMA/GETA backup discussed.  Patient consented for risks of anesthesia including but not limited to:  - adverse reactions to medications - damage to eyes, teeth, lips or other oral mucosa - nerve damage due to positioning  - sore throat or hoarseness - damage to heart, brain, nerves, lungs, other parts of body or loss of life  Informed patient about role of CRNA in peri- and intra-operative care.  Patient voiced understanding.  Pt explicitly states she would NOT want chest compressions or shocks in the event of a code intraoperatively.  Endotracheal intubation acceptable if needed for anesthesia.  I agree to her wishes.  )        Anesthesia Quick Evaluation

## 2023-10-21 NOTE — Plan of Care (Signed)
  Problem: Education: Goal: Knowledge of General Education information will improve Description: Including pain rating scale, medication(s)/side effects and non-pharmacologic comfort measures Outcome: Progressing   Problem: Health Behavior/Discharge Planning: Goal: Ability to manage health-related needs will improve Outcome: Progressing   Problem: Clinical Measurements: Goal: Ability to maintain clinical measurements within normal limits will improve Outcome: Progressing Goal: Will remain free from infection Outcome: Progressing Goal: Diagnostic test results will improve Outcome: Progressing Goal: Respiratory complications will improve Outcome: Progressing Goal: Cardiovascular complication will be avoided Outcome: Progressing   Problem: Activity: Goal: Risk for activity intolerance will decrease Outcome: Progressing   Problem: Nutrition: Goal: Adequate nutrition will be maintained Outcome: Progressing   Problem: Coping: Goal: Level of anxiety will decrease Outcome: Progressing   Problem: Elimination: Goal: Will not experience complications related to bowel motility Outcome: Progressing   Problem: Pain Managment: Goal: General experience of comfort will improve and/or be controlled Outcome: Progressing   Problem: Safety: Goal: Ability to remain free from injury will improve Outcome: Progressing   Problem: Skin Integrity: Goal: Risk for impaired skin integrity will decrease Outcome: Progressing   Problem: Elimination: Goal: Will not experience complications related to urinary retention Outcome: Not Progressing

## 2023-10-21 NOTE — Progress Notes (Addendum)
  PROGRESS NOTE    Kathy [name]  HKV:[id number] DOB: 1941-01-23 DOA: 10/19/2023 PCP: [name], MD  [id number]  LOS: 2 days   Brief hospital course:   Assessment & Plan: Kathy [name] is a 83 y.o. female with medical history significant of multiple myeloma on chemotherapy, hypotension on midodrine, HLD, hypothyroidism, anxiety, CKD stage IIIb, rheumatoid arthritis, stomach ulcer, pancytopenia, who presents with cough, SOB, lightheadedness and abnormal lab with hypokalemia.    Acute pulmonary embolism (HCC):  pt has acute PE with CT evidence of right heart strain.   --thrombectomy and IVC filter today --cont heparin gtt   Acute hypoxemic respiratory failure --New O2 requirement, 2-4L, due to acute PE and Flu. --Continue supplemental O2 to keep sats >=92%, wean as tolerated  DVT (deep venous thrombosis) (HCC) --cont heparin gtt   Influenza A -Bronchodilators, as needed Mucinex --cont Tamiflu   Hypokalemia Hypomag Hypophos Hypocalcemia  --monitor and supplement PRN  Pericardial effusion:  CTA showed moderate to large pericardial effusion.  Cardio consulted --Echo showed relatively small pericardial effusion.  No need for further management.  Pancytopenia (HCC):  Likely due to chemotherapy --1u pRBC today for Hgb 8.0 due to expected blood loss with thrombectomy   Acquired hypothyroidism -Synthroid   History of hypotension -Continue home midodrine 7.5 mg 3 times daily   Multiple myeloma (HCC) -Patient is on chemotherapy, last treatment was on 3/6   Myocardial injury:  Troponin 62, likely demand ischemia.   Compression fracture of L2 (HCC) and compression fracture of T5 vertebra (HCC) -Pain control: As needed morphine, Percocet, Tylenol -As needed Robaxin -Lidoderm patch   Fall at home, initial encounter -Fall precaution -PT  Acute urinary retention --pt had retention this morning requiring I/O.  If 2nd I/O needed, then place Foley  cath.    DVT prophylaxis: FI:[id number] gtt Code Status: DNR  Family Communication: brother updated at bedside today Level of care: Stepdown Dispo:   The patient is from: SNF LTC Anticipated d/c is to: SNF LTC Anticipated d/c date is: 1-2 days   Subjective and Interval History:  Pt underwent thrombectomy today.     Objective: Vitals:   10/21/23 1600 10/21/23 1700 10/21/23 1800 10/21/23 1900  BP: 93/63 99/70 95/65  105/73  Pulse: 92 92 96 (!) 130  Resp: (!) 22 20 (!) 21 19  Temp:      TempSrc:      SpO2: 96% 98% 100% 93%  Weight:      Height:        Intake/Output Summary (Last 24 hours) at 10/21/2023 1914 Last data filed at 10/21/2023 1850 Gross per 24 hour  Intake 1819.16 ml  Output 1076 ml  Net 743.16 ml   Filed Weights   10/19/23 1904 10/20/23 2038 10/21/23 0600  Weight: 75.7 kg 60.5 kg 58.9 kg    Examination:   Constitutional: NAD, AAOx3 HEENT: conjunctivae and lids normal, EOMI CV: No cyanosis.   RESP: normal respiratory effort, on 2L Neuro: II - XII grossly intact.   Psych: Normal mood and affect.     Data Reviewed: I have personally reviewed labs and imaging studies  Time spent: 50 minutes  Darlin Priestly, MD [facility] If 7PM-7AM, please contact night-coverage 10/21/2023, 7:14 PM

## 2023-10-21 NOTE — Progress Notes (Signed)
 Pt returned to ICU 14 at this time. Pt placed on 2L [place]. PRBCs still infusing (had to be stopped during procedure per RN d/t IV not working). Groin site assessed and CDI. Pt resting comfortably, VSS.

## 2023-10-21 NOTE — TOC Progression Note (Signed)
 Transition of Care [facility]) - Progression Note    Patient Details  Name: Kathy [name] MRN: [id number] Date of Birth: Feb 28, 1941  Transition of Care [facility]) CM/SW Contact  Garret Reddish, RN Phone Number: 10/21/2023, 3:16 PM  Clinical Narrative:    Chart reviewed.  Noted that patient was admitted with Acute Pulmonary embolism.  Patient had bilateral Pulmonary Thrombectomy today.    I have noted that patient is from LTC from [facility].  I have left a VM for [facility] admission team to return call.  TOC will continue to follow for discharge planning.     Expected Discharge Plan: Skilled Nursing Facility Barriers to Discharge: Continued Medical Work up  Expected Discharge Plan and Services     Post Acute Care Choice: Resumption of Svcs/PTA Provider Living arrangements for the past 2 months: Skilled Nursing Facility                                       Social Determinants of Health (SDOH) Interventions SDOH Screenings   Food Insecurity: No Food Insecurity (10/20/2023)  Housing: Low Risk  (10/20/2023)  Transportation Needs: No Transportation Needs (10/20/2023)  Utilities: Not At Risk (10/20/2023)  Alcohol Screen: Low Risk  (09/14/2021)  Depression (PHQ2-9): Low Risk  (03/30/2023)  Financial Resource Strain: Low Risk  (09/14/2021)  Physical Activity: Sufficiently Active (09/14/2021)  Social Connections: Moderately Integrated (09/30/2023)  Stress: No Stress Concern Present (09/14/2021)  Tobacco Use: Low Risk  (10/19/2023)    Readmission Risk Interventions    10/20/2023   11:10 AM 10/02/2023   10:16 AM 06/17/2023   11:10 AM  Readmission Risk Prevention Plan  Transportation Screening Complete Complete Complete  Medication Review Oceanographer) Complete Complete Complete  PCP or Specialist appointment within 3-5 days of discharge Complete Complete   HRI or Home Care Consult  Complete Complete  SW Recovery Care/Counseling Consult Complete Complete Complete   Palliative Care Screening Not Applicable Complete Not Applicable  Skilled Nursing Facility Complete Complete Complete

## 2023-10-21 NOTE — Interval H&P Note (Signed)
 History and Physical Interval Note:  10/21/2023 10:38 AM  Kathy [name]  has presented today for surgery, with the diagnosis of Pulmonary Embolism.  The various methods of treatment have been discussed with the patient and family. After consideration of risks, benefits and other options for treatment, the patient has consented to  Procedure(s): PULMONARY THROMBECTOMY (Bilateral) as a surgical intervention.  The patient's history has been reviewed, patient examined, no change in status, stable for surgery.  I have reviewed the patient's chart and labs.  Questions were answered to the patient's satisfaction.     Festus Barren

## 2023-10-21 NOTE — H&P (View-Only) (Signed)
 Progress Note    10/21/2023 8:14 AM * No surgery found *  Subjective:  Kathy [name] is an 83 yo female who presents to [facility] emergency department yesterday on 10/20/2023 with increased shortness of breath, lightheadedness and abnormal labs with hypokalemia of 1.9.  Upon workup she underwent a CTA of the chest which revealed large clot burden with bilateral pulmonary embolisms and right heart strain with an RV to LV ratio of 1.45.  Etiology completed an echocardiogram which endorses a small amount of pericardial fluid but otherwise normal ejection fraction and should be able to withstand a pulmonary thrombectomy once electrolytes are corrected.  Patient rested comfortably overnight.  On exam this morning she remains with labored breathing and diminished breath sounds throughout bilateral lungs.  She appears to be uncomfortable with this type of breathing.  Her vital signs however are better this morning.  On 2 L nasal cannula oxygen she was 97% saturation.  However the patient does endorse feeling very short of breath.  No other complaints overnight and vitals remained stable.   Vitals:   10/21/23 0700 10/21/23 0723  BP: (!) 88/54   Pulse: 99 95  Resp: 18 20  Temp:    SpO2: 98% 95%   Physical Exam: Cardiac:  RRR with Tachycardia. Normal S1, S2. No murmurs present. Lungs:  Labored breathing. Moving air but diminished throughout.  Incisions:  None  Extremities:  weak palpable pulses throughout. Extremities warm to touch  Abdomen:  Positive bowel sounds, soft, non tender and non distended.  Neurologic: AAOX3 answers questions and follows commands.   CBC    Component Value Date/Time   WBC 1.3 (LL) 10/21/2023 0340   RBC 2.44 (L) 10/21/2023 0340   HGB 8.0 (L) 10/21/2023 0340   HGB 10.2 (L) 07/19/2022 1421   HCT 22.0 (L) 10/21/2023 0340   HCT 30.2 (L) 07/19/2022 1421   PLT 61 (L) 10/21/2023 0340   PLT 223 07/19/2022 1421   MCV 90.2 10/21/2023 0340   MCV 97 07/19/2022 1421   MCH  32.8 10/21/2023 0340   MCHC 36.4 (H) 10/21/2023 0340   RDW 17.4 (H) 10/21/2023 0340   RDW 14.6 07/19/2022 1421   LYMPHSABS 1.0 10/19/2023 1758   LYMPHSABS 3.3 (H) 07/19/2022 1421   MONOABS 0.1 10/19/2023 1758   EOSABS 0.1 10/19/2023 1758   EOSABS 0.1 07/19/2022 1421   BASOSABS 0.0 10/19/2023 1758   BASOSABS 0.0 07/19/2022 1421    BMET    Component Value Date/Time   NA 143 10/21/2023 0340   NA 135 02/23/2023 1328   K 3.7 10/21/2023 0340   CL 108 10/21/2023 0340   CO2 25 10/21/2023 0340   GLUCOSE 95 10/21/2023 0340   BUN 13 10/21/2023 0340   BUN 26 02/23/2023 1328   CREATININE 0.71 10/21/2023 0340   CREATININE 0.84 08/25/2020 1118   CALCIUM 6.4 (LL) 10/21/2023 0340   GFRNONAA >60 10/21/2023 0340   GFRNONAA 70 11/22/2019 0935   GFRAA 81 11/22/2019 0935    INR    Component Value Date/Time   INR 1.5 (H) 10/19/2023 1758     Intake/Output Summary (Last 24 hours) at 10/21/2023 0814 Last data filed at 10/21/2023 0730 Gross per 24 hour  Intake 927.2 ml  Output 400 ml  Net 527.2 ml     Assessment/Plan:  83 y.o. female presents to [facility] emergency department with shortness of breath.  Diagnosed with bilateral pulmonary embolisms.  Patient with heavy clot burden and RV to LV ratio 1.45.  Patient continues  to have labored breathing.  Electrolytes were repleted over the last 24 hours and her hypokalemia is now resolved with a potassium this morning of 3.7.  Serum remains low at 6.2.* No surgery found *   PLAN Therefore vascular surgery will plan on taking the patient to the vascular lab this afternoon on 10/21/2023 for a pulmonary thrombectomy and an IVC filter placement.  I had a long detailed discussion again this morning at the bedside regarding the procedure, benefits, risk, and complications.  Patient verbalizes her understanding and wishes to proceed today.  Patient has been n.p.o. since midnight last night.  Patient has been on a heparin infusion since entering the emergency  room yesterday.  DVT prophylaxis: Heparin infusion  I discussed the case in detail with Dr. Festus Barren MD and he agrees with the plan.   Marcie Bal [facility] 10/21/2023 8:14 AM

## 2023-10-22 DIAGNOSIS — J101 Influenza due to other identified influenza virus with other respiratory manifestations: Secondary | ICD-10-CM | POA: Diagnosis not present

## 2023-10-22 DIAGNOSIS — I824Y3 Acute embolism and thrombosis of unspecified deep veins of proximal lower extremity, bilateral: Secondary | ICD-10-CM | POA: Diagnosis not present

## 2023-10-22 DIAGNOSIS — I2609 Other pulmonary embolism with acute cor pulmonale: Secondary | ICD-10-CM | POA: Diagnosis not present

## 2023-10-22 LAB — BASIC METABOLIC PANEL
Anion gap: 7 (ref 5–15)
BUN: 15 mg/dL (ref 8–23)
CO2: 25 mmol/L (ref 22–32)
Calcium: 6.4 mg/dL — CL (ref 8.9–10.3)
Chloride: 111 mmol/L (ref 98–111)
Creatinine, Ser: 0.77 mg/dL (ref 0.44–1.00)
GFR, Estimated: >60 mL/min (ref >60)
Glucose, Bld: 90 mg/dL (ref 70–99)
Potassium: 3.9 mmol/L (ref 3.5–5.1)
Sodium: 143 mmol/L (ref 135–145)

## 2023-10-22 LAB — CBC
HCT: 19.4 % — ABNORMAL LOW (ref 36.0–46.0)
Hemoglobin: 6.9 g/dL — ABNORMAL LOW (ref 12.0–15.0)
MCH: 30.8 pg (ref 26.0–34.0)
MCHC: 35.6 g/dL (ref 30.0–36.0)
MCV: 86.6 fL (ref 80.0–100.0)
Platelets: 38 10*3/uL — ABNORMAL LOW (ref 150–400)
RBC: 2.24 MIL/uL — ABNORMAL LOW (ref 3.87–5.11)
RDW: 18.4 % — ABNORMAL HIGH (ref 11.5–15.5)
WBC: 1 10*3/uL — CL (ref 4.0–10.5)
nRBC: 2.1 % — ABNORMAL HIGH (ref 0.0–0.2)

## 2023-10-22 LAB — PHOSPHORUS: Phosphorus: 2.1 mg/dL — ABNORMAL LOW (ref 2.5–4.6)

## 2023-10-22 LAB — MAGNESIUM: Magnesium: 1.9 mg/dL (ref 1.7–2.4)

## 2023-10-22 LAB — PREPARE RBC (CROSSMATCH)

## 2023-10-22 LAB — HEMOGLOBIN AND HEMATOCRIT, BLOOD
HCT: 26.8 % — ABNORMAL LOW (ref 36.0–46.0)
Hemoglobin: 9.4 g/dL — ABNORMAL LOW (ref 12.0–15.0)

## 2023-10-22 LAB — HEPARIN LEVEL (UNFRACTIONATED): Heparin Unfractionated: <0.1 [IU]/mL — ABNORMAL LOW (ref 0.30–0.70)

## 2023-10-22 MED ORDER — K PHOS MONO-SOD PHOS DI & MONO 155-852-130 MG PO TABS
500.0000 mg | ORAL_TABLET | Freq: Once | ORAL | Status: AC
  Administered 2023-10-22: 500 mg via ORAL
  Filled 2023-10-22: qty 2

## 2023-10-22 MED ORDER — HEPARIN BOLUS VIA INFUSION
1900.0000 [IU] | Freq: Once | INTRAVENOUS | Status: AC
  Administered 2023-10-22: 1900 [IU] via INTRAVENOUS
  Filled 2023-10-22: qty 1900

## 2023-10-22 MED ORDER — CALCIUM GLUCONATE-NACL 1-0.675 GM/50ML-% IV SOLN
1.0000 g | Freq: Once | INTRAVENOUS | Status: AC
  Administered 2023-10-22: 1000 mg via INTRAVENOUS
  Filled 2023-10-22: qty 50

## 2023-10-22 MED ORDER — APIXABAN 5 MG PO TABS: 5.0000 mg | ORAL_TABLET | Freq: Two times a day (BID) | ORAL | Status: DC

## 2023-10-22 MED ORDER — APIXABAN 5 MG PO TABS
10.0000 mg | ORAL_TABLET | Freq: Two times a day (BID) | ORAL | Status: DC
  Administered 2023-10-22 (×2): 10 mg via ORAL
  Filled 2023-10-22 (×3): qty 2

## 2023-10-22 MED ORDER — SODIUM CHLORIDE 0.9% IV SOLUTION: Freq: Once | INTRAVENOUS | Status: AC

## 2023-10-22 MED ORDER — ONDANSETRON HCL 4 MG/2ML IJ SOLN
4.0000 mg | Freq: Three times a day (TID) | INTRAMUSCULAR | Status: DC | PRN
  Administered 2023-10-22: 4 mg via INTRAVENOUS

## 2023-10-22 NOTE — Progress Notes (Signed)
      Daily Progress Note   Assessment/Planning:   POD #1 s/p PE thrombectomy  Improved SaO2 and tachycardia Still some exertional dyspnea Anticoagulation per primary team Nothing more to add to patient's care.  Available as needed.   Subjective  - 1 Day Post-Op   Weak voice, breathing better   Objective   Vitals:   10/22/23 0304 10/22/23 0400 10/22/23 0500 10/22/23 0600  BP: 95/70 96/67 101/70 91/62  Pulse: 86 78 86 75  Resp: (!) 25 18 (!) 29 (!) 21  Temp:  98 F (36.7 C)    TempSrc:  Oral    SpO2: 100% 100% 100% 92%  Weight:   63.7 kg   Height:         Intake/Output Summary (Last 24 hours) at 10/22/2023 0701 Last data filed at 10/22/2023 4034 Gross per 24 hour  Intake 1273.8 ml  Output 746 ml  Net 527.8 ml    PULM Suboptimal inspiration, no rales  VASC R groin: no hematoma, no echymosis    Laboratory   CBC    Latest Ref Rng & Units 10/22/2023    5:20 AM 10/21/2023    5:08 PM 10/21/2023    3:40 AM  CBC  WBC 4.0 - 10.5 K/uL 1.0   1.3   Hemoglobin 12.0 - 15.0 g/dL 6.9  8.1  8.0   Hematocrit 36.0 - 46.0 % 19.4  22.5  22.0   Platelets 150 - 400 K/uL 38   61     BMET    Component Value Date/Time   NA 143 10/22/2023 0520   NA 135 02/23/2023 1328   K 3.9 10/22/2023 0520   CL 111 10/22/2023 0520   CO2 25 10/22/2023 0520   GLUCOSE 90 10/22/2023 0520   BUN 15 10/22/2023 0520   BUN 26 02/23/2023 1328   CREATININE 0.77 10/22/2023 0520   CREATININE 0.84 08/25/2020 1118   CALCIUM 6.4 (LL) 10/22/2023 0520   GFRNONAA >60 10/22/2023 0520   GFRNONAA 70 11/22/2019 0935   GFRAA 81 11/22/2019 0935     Leonides Sake, MD, FACS, FSVS Covering for [facility]: [phone]  10/22/2023, 7:01 AM

## 2023-10-22 NOTE — Progress Notes (Signed)
 PHARMACY - ANTICOAGULATION CONSULT NOTE  Pharmacy Consult for heparin Indication: pulmonary embolus and DVT  Allergies  Allergen Reactions   Macrobid [Nitrofurantoin] Other (See Comments)    Stomach pain    Patient Measurements: Height: 5\' 2"  (157.5 cm) Weight: 58.9 kg (129 lb 13.6 oz) IBW/kg (Calculated) : 50.1 Heparin Dosing Weight: 64.5 kg  Vital Signs: Temp: 98.6 F (37 C) (03/22 0000) Temp Source: Oral (03/22 0000) BP: 96/64 (03/22 0200) Pulse Rate: 80 (03/22 0200)  Labs: Recent Labs    10/19/23 1758 10/19/23 1758 10/20/23 0151 10/20/23 1026 10/20/23 2137 10/21/23 0340 10/21/23 1708 10/21/23 2357  HGB 10.1*  --  8.7*  --   --  8.0* 8.1*  --   HCT 29.5*  --  25.3*  --   --  22.0* 22.5*  --   PLT 56*  --  48* 53*  --  61*  --   --   APTT 39*  --   --   --   --   --   --   --   LABPROT 18.1*  --   --   --   --   --   --   --   INR 1.5*  --   --   --   --   --   --   --   HEPARINUNFRC  --    < > 0.44 0.35  --  0.14*  --  <0.10*  CREATININE 0.77  --  0.60  --  0.68 0.71  --   --   TROPONINIHS 62*  --  50*  --   --   --   --   --    < > = values in this interval not displayed.    Estimated Creatinine Clearance: 43.6 mL/min (by C-G formula based on SCr of 0.71 mg/dL).   Medical History: Past Medical History:  Diagnosis Date   Abnormal findings on diagnostic imaging of heart and coronary circulation    Anemia    FROM BLEEDING ULCER   Anxiety    takes Alprazolam daily as needed   Arthritis    dx with RA 2017   Bariatric surgery status    Cataract    Chronic heart failure with preserved ejection fraction (HFpEF) (HCC)    a. 11/2016 Echo: EF 55-60%, Gr1 DD, no rwma; b. 03/2022 Echo: EF 60-65%, no rwma, nl RV fxn, mildly elev RVSP, mild-mod dil LA, mildly dil RA, mild MR, mild-mod TR.   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   Localized edema    Nocturia    Numbness    occasionally left arm at night   Obesity    Osteoporosis    takes Fosamax weekly   Pain in joint involving pelvic region and thigh    Peripheral edema    takes Lasix daily as needed   PONV (postoperative nausea and vomiting)    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/2017   Rheumatoid arthritis, unspecified (HCC)    Rotator cuff arthropathy, 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   Sleep apnea  Stomach ulcer    Unspecified injury of muscle(s) and tendon(s) of the rotator cuff of left shoulder, subsequent encounter    Unspecified osteoarthritis, unspecified site    Wears glasses    Wears partial dentures    Assessment: 83 y/o female presenting with weakenss. PMH significant for  multiple myeloma, CKD, RA, chronic hypotension on midodrine, OSA, hypertension, hyperlipidemia, hypothyroidism. Imaging shows DVT in right femoral vein as well as submassive PE. Pharmacy has been consulted to initiate heparin infusion. Per chart review, patient was not on anticoagulation prior to admission.  Baseline labs: hgb 10.1, plt 56, INR pending  Platelets low at BL - discussed benefits outweigh risks with MD, will proceed with heparin infusion as patient has submassive PE and R DVT.  Goal of Therapy:  Heparin level 0.3-0.7 units/ml Monitor platelets by anticoagulation protocol: Yes  03/20 0151 HL 0.44, therapeutic x 1, Plt trending down slightly from 56 to 44 03/20 1026 HL 0.35, therapeutic x 2, PLT stable 03/20 0340 HL 0.14, subtherapeutic, PLT stable at 61, Hgb trending down 03   Plan:  Will bolus 1900 units x 1 Increase heparin infusion to 1450 units/hr Recheck HL in 8 hrs after rate change Monitor CBC daily   Thank you for involving pharmacy in this  patient's care.   Otelia Sergeant, PharmD, MBA 10/22/2023 2:08 AM

## 2023-10-22 NOTE — Evaluation (Signed)
 Physical Therapy Evaluation Patient Details Name: Kathy [name] MRN: [id number] DOB: 1940-09-24 Today's Date: 10/22/2023  History of Present Illness  presented to ER secondary to cough, SOB, lightheadedness; admitted for management of acute bilat PE (R/L pulmonary arteries, high clot burden, R heart strain), bilat LE DVT (non-occlusive thrombus to R/L common femoral, femoral and popliteal arteries).  S/p thrombectomy and IVC filter placement (3/21)  Clinical Impression  Patient resting in bed upon arrival to session; just finished ADLs with CNA at bedside.  Patient alert and oriented, follows commands and voices goal of OOB to chair.  Denies pain; does report improvement in respiratory status since admission.  Patient generally weak and deconditioned throughout all extremities; notable knee flexion ROM deficits to bilat knees (L > R). Currently requiring max assist for bed mobility; mod/max assist +1 for sit/stand, standing balance and bed/chair transfer.  Requires extensive assist for LE placement and stabilization, lift off and movement of COG over BOS.  Significant difficulty due to L > R knee flexion restrictions.  Once upright, maintains with min/mod assist +1, but globally weak and unsteady.  Unable to tolerate additional activity as result. Of note, trialed on RA during session; maintaining sats >92% on RA at rest and with activity.  Left on RA end of session; RN informed/aware. Would benefit from skilled PT to address above deficits and promote optimal return to PLOF; recommend post-acute PT follow up as indicated by interdisciplinary care team.          If plan is discharge home, recommend the following: A lot of help with walking and/or transfers;A lot of help with bathing/dressing/bathroom   Can travel by private vehicle        Equipment Recommendations    Recommendations for Other Services       Functional Status Assessment Patient has had a recent decline in their  functional status and demonstrates the ability to make significant improvements in function in a reasonable and predictable amount of time.     Precautions / Restrictions Precautions Precautions: Fall Restrictions Weight Bearing Restrictions Per Provider Order: No      Mobility  Bed Mobility Overal bed mobility: Needs Assistance Bed Mobility: Supine to Sit     Supine to sit: Max assist          Transfers Overall transfer level: Needs assistance Equipment used: Rolling walker (2 wheels) Transfers: Sit to/from Stand, Bed to chair/wheelchair/BSC Sit to Stand: Mod assist, Max assist Stand pivot transfers: Mod assist         General transfer comment: Extensive assist for lift off and movement of COG over BOS.  Significant difficulty due to L > R knee flexion restrictions.  Once upright, maintains with min/mod assist +1, but globally weak and unsteady.    Ambulation/Gait               General Gait Details: deferred this date due to generalized weakness  Stairs            Wheelchair Mobility     Tilt Bed    Modified Rankin (Stroke Patients Only)       Balance Overall balance assessment: Needs assistance Sitting-balance support: No upper extremity supported, Feet supported Sitting balance-Leahy Scale: Fair     Standing balance support: Bilateral upper extremity supported Standing balance-Leahy Scale: Poor                               Pertinent Vitals/Pain  Pain Assessment Pain Assessment: No/denies pain    Home Living Family/patient expects to be discharged to:: Skilled nursing facility                   Additional Comments: Pt reports living at [facility]    Prior Function Prior Level of Function : Needs assist             Mobility Comments: Per patient, +1 assist for transfers to/from manual WC (for longer distances); ambulatory for short distances with staff assist with RW and +1 ADLs Comments: Pt reports  staff assist her from bed level with self care tasks but she is able to self feed at baseline     Extremity/Trunk Assessment   Upper Extremity Assessment Upper Extremity Assessment: Generalized weakness    Lower Extremity Assessment Lower Extremity Assessment: Generalized weakness;RLE deficits/detail;LLE deficits/detail RLE Deficits / Details: R knee flexion to approx 70-75 degrees; significant recurvatum in stance/WBing LLE Deficits / Details: L knee fixed in extension       Communication        Cognition Arousal: Alert Behavior During Therapy: WFL for tasks assessed/performed   PT - Cognitive impairments: No apparent impairments                                 Cueing       General Comments      Exercises     Assessment/Plan    PT Assessment Patient needs continued PT services  PT Problem List Decreased strength;Decreased range of motion;Decreased activity tolerance;Decreased balance;Decreased mobility;Decreased coordination;Decreased knowledge of use of DME;Decreased safety awareness;Decreased knowledge of precautions;Cardiopulmonary status limiting activity       PT Treatment Interventions DME instruction;Gait training;Functional mobility training;Therapeutic activities;Therapeutic exercise;Balance training;Cognitive remediation;Patient/family education    PT Goals (Current goals can be found in the Care Plan section)  Acute Rehab PT Goals Patient Stated Goal: to get up to the chair PT Goal Formulation: With patient Time For Goal Achievement: 11/05/23 Potential to Achieve Goals: Good    Frequency Min 2X/week     Co-evaluation               AM-PAC PT "6 Clicks" Mobility  Outcome Measure Help needed turning from your back to your side while in a flat bed without using bedrails?: A Lot Help needed moving from lying on your back to sitting on the side of a flat bed without using bedrails?: A Lot Help needed moving to and from a bed to a  chair (including a wheelchair)?: A Lot Help needed standing up from a chair using your arms (e.g., wheelchair or bedside chair)?: A Lot Help needed to walk in hospital room?: A Lot Help needed climbing 3-5 steps with a railing? : Total 6 Click Score: 11    End of Session Equipment Utilized During Treatment: Gait belt Activity Tolerance: Patient tolerated treatment well Patient left: in chair;with call bell/phone within reach Nurse Communication: Mobility status PT Visit Diagnosis: Muscle weakness (generalized) (M62.81);Difficulty in walking, not elsewhere classified (R26.2)    Time: 1204-1226 PT Time Calculation (min) (ACUTE ONLY): 22 min   Charges:   PT Evaluation $PT Eval Moderate Complexity: 1 Mod   PT General Charges $$ ACUTE PT VISIT: 1 Visit         Dam Ashraf H. Manson Passey, PT, DPT, NCS 10/22/23, 1:05 PM [phone]

## 2023-10-22 NOTE — Progress Notes (Signed)
 PHARMACY CONSULT NOTE - ELECTROLYTES  Pharmacy Consult for Electrolyte Monitoring and Replacement   Recent Labs: Height: 5\' 2"  (157.5 cm) Weight: 63.7 kg (140 lb 6.9 oz) IBW/kg (Calculated) : 50.1 Estimated Creatinine Clearance: 48.3 mL/min (by C-G formula based on SCr of 0.77 mg/dL). Potassium (mmol/L)  Date Value  10/22/2023 3.9   Magnesium (mg/dL)  Date Value  19/14/7829 1.9   Calcium (mg/dL)  Date Value  56/21/3086 6.4 (LL)   Albumin (g/dL)  Date Value  57/84/6962 2.6 (L)  03/18/2021 3.2 (L)   Phosphorus (mg/dL)  Date Value  95/28/4132 2.1 (L)   Sodium (mmol/L)  Date Value  10/22/2023 143  02/23/2023 135   Corrected Ca: 7.5 mg/dL  Assessment  Kathy [name] is a 83 y.o. female presenting with weakness. PMH significant for multiple myeloma, CKD, RA, chronic hypotension on midodrine, OSA, hypertension, hyperlipidemia, hypothyroidism. Pharmacy has been consulted to monitor and replace electrolytes.  Goal of Therapy: Electrolytes WNL  Plan: replace phos 500 mg po phosphorous x 1 (contains contains phosphorus 16 mMol, potassium 2.2 mEq) Check BMP, Mg, Phos with AM labs  Thank you for allowing pharmacy to be a part of this patient's care.  Lowella Bandy, PharmD Clinical Pharmacist 10/22/2023 7:12 AM

## 2023-10-22 NOTE — Progress Notes (Signed)
 PHARMACY - ANTICOAGULATION CONSULT NOTE  Pharmacy Consult for transition from  heparin to apixaban Indication: pulmonary embolus and DVT  Allergies  Allergen Reactions   Macrobid [Nitrofurantoin] Other (See Comments)    Stomach pain    Patient Measurements: Height: 5\' 2"  (157.5 cm) Weight: 63.7 kg (140 lb 6.9 oz) IBW/kg (Calculated) : 50.1 Heparin Dosing Weight: 64.5 kg  Vital Signs: Temp: 97.7 F (36.5 C) (03/22 0800) Temp Source: Oral (03/22 0732) BP: 96/62 (03/22 0800) Pulse Rate: 86 (03/22 0800)  Labs: Recent Labs    10/19/23 1758 10/19/23 1758 10/20/23 0151 10/20/23 1026 10/20/23 2137 10/21/23 0340 10/21/23 1708 10/21/23 2357 10/22/23 0520  HGB 10.1*  --  8.7*  --   --  8.0* 8.1*  --  6.9*  HCT 29.5*  --  25.3*  --   --  22.0* 22.5*  --  19.4*  PLT 56*  --  48* 53*  --  61*  --   --  38*  APTT 39*  --   --   --   --   --   --   --   --   LABPROT 18.1*  --   --   --   --   --   --   --   --   INR 1.5*  --   --   --   --   --   --   --   --   HEPARINUNFRC  --    < > 0.44 0.35  --  0.14*  --  <0.10*  --   CREATININE 0.77  --  0.60  --  0.68 0.71  --   --  0.77  TROPONINIHS 62*  --  50*  --   --   --   --   --   --    < > = values in this interval not displayed.    Estimated Creatinine Clearance: 48.3 mL/min (by C-G formula based on SCr of 0.77 mg/dL).   Medical History: Past Medical History:  Diagnosis Date   Abnormal findings on diagnostic imaging of heart and coronary circulation    Anemia    FROM BLEEDING ULCER   Anxiety    takes Alprazolam daily as needed   Arthritis    dx with RA 2017   Bariatric surgery status    Cataract    Chronic heart failure with preserved ejection fraction (HFpEF) (HCC)    a. 11/2016 Echo: EF 55-60%, Gr1 DD, no rwma; b. 03/2022 Echo: EF 60-65%, no rwma, nl RV fxn, mildly elev RVSP, mild-mod dil LA, mildly dil RA, mild MR, mild-mod TR.   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   Localized edema    Nocturia    Numbness    occasionally left arm at night   Obesity    Osteoporosis    takes Fosamax weekly   Pain in joint involving pelvic region and thigh    Peripheral edema    takes Lasix daily as needed   PONV (postoperative nausea and vomiting)    Primary localized  osteoarthritis of left knee 11/29/2017   Rheumatoid arthritis, unspecified (HCC)    Rotator cuff arthropathy, 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   Sleep apnea    Stomach ulcer    Unspecified injury of muscle(s) and tendon(s) of the rotator cuff of left shoulder, subsequent encounter    Unspecified osteoarthritis, unspecified site    Wears glasses    Wears partial dentures    Assessment: 83 y/o female presenting with weakenss. PMH significant for  multiple myeloma, CKD, RA, chronic hypotension on midodrine, OSA, hypertension, hyperlipidemia, hypothyroidism. Imaging shows DVT in right femoral vein as well as submassive PE. Pharmacy has been consulted to transition from heparin infusion to apixaban. Per chart review, patient was not on anticoagulation prior to admission.  Baseline labs: hgb 10.1, plt 56, INR pending  Platelets low at BL - discussed benefits outweigh risks with MD, will proceed with heparin infusion as patient has submassive PE and R DVT.  Goal of Therapy:  Monitor platelets by anticoagulation protocol: Yes   Plan:  ---stop heparin infusion ---start apixaban 10 mg twice daily for 7 days followed by 5 mg twice daily ---Monitor CBC at least once weekly  Thank you for involving pharmacy in this patient's care.   Burnis Medin, PharmD, BCPS 10/22/2023 8:09 AM

## 2023-10-22 NOTE — Progress Notes (Addendum)
 Lots of visitors today. Family from out of state also here.Patient refused to eat any breakfast. Given 1 unit of PRBCs as ordered. 1235 Patient was nauseated and refused lunch. After treating nausea patient has still refused to eat or drink today. Dr. Nelson Chimes planned to discharge patient back to SNF but facility would not accept her until Monday. Status changed to Med/Surg with tele.

## 2023-10-22 NOTE — Plan of Care (Signed)
  Problem: Education: Goal: Knowledge of General Education information will improve Description: Including pain rating scale, medication(s)/side effects and non-pharmacologic comfort measures Outcome: Progressing   Problem: Clinical Measurements: Goal: Ability to maintain clinical measurements within normal limits will improve Outcome: Progressing Goal: Diagnostic test results will improve Outcome: Progressing   Problem: Safety: Goal: Ability to remain free from injury will improve Outcome: Progressing

## 2023-10-22 NOTE — TOC Progression Note (Signed)
 Transition of Care [facility]) - Progression Note    Patient Details  Name: Kathy [name] MRN: [id number] Date of Birth: 1941/05/01  [facility] CM/SW Contact  Liliana Cline, LCSW Phone Number: 10/22/2023, 11:10 AM  Clinical Narrative:    CSW notified by MD that patient is ready to DC back to her SNF. CSW attempted call to Tobi Bastos in Admissions at [facility]. No answer. Left VM requesting return call. CSW attempted call to [employer] at [facility]. Spoke to [place] who states patient cannot come back until Monday.   Expected Discharge Plan: Skilled Nursing Facility Barriers to Discharge: Continued Medical Work up  Expected Discharge Plan and Services     Post Acute Care Choice: Resumption of Svcs/PTA Provider Living arrangements for the past 2 months: Skilled Nursing Facility                                       Social Determinants of Health (SDOH) Interventions SDOH Screenings   Food Insecurity: No Food Insecurity (10/20/2023)  Housing: Low Risk  (10/20/2023)  Transportation Needs: No Transportation Needs (10/20/2023)  Utilities: Not At Risk (10/20/2023)  Alcohol Screen: Low Risk  (09/14/2021)  Depression (PHQ2-9): Low Risk  (03/30/2023)  Financial Resource Strain: Low Risk  (09/14/2021)  Physical Activity: Sufficiently Active (09/14/2021)  Social Connections: Moderately Integrated (09/30/2023)  Stress: No Stress Concern Present (09/14/2021)  Tobacco Use: Low Risk  (10/19/2023)    Readmission Risk Interventions    10/20/2023   11:10 AM 10/02/2023   10:16 AM 06/17/2023   11:10 AM  Readmission Risk Prevention Plan  Transportation Screening Complete Complete Complete  Medication Review Oceanographer) Complete Complete Complete  PCP or Specialist appointment within 3-5 days of discharge Complete Complete   HRI or Home Care Consult  Complete Complete  SW Recovery Care/Counseling Consult Complete Complete Complete  Palliative Care Screening Not  Applicable Complete Not Applicable  Skilled Nursing Facility Complete Complete Complete

## 2023-10-22 NOTE — Progress Notes (Signed)
 PROGRESS NOTE    Kathy [name]  WEX:[id number] DOB: 30-Mar-1941 DOA: 10/19/2023 PCP: [name], MD  [id number]  LOS: 3 days   Brief hospital course:  Kathy [name] is a 83 y.o. female with medical history significant of multiple myeloma on chemotherapy, hypotension on midodrine, HLD, hypothyroidism, anxiety, CKD stage IIIb, rheumatoid arthritis, stomach ulcer, pancytopenia, who presents with cough, SOB, lightheadedness and abnormal lab with hypokalemia.   3/22: S/p mechanical thrombectomy and thrombolysis along with IVC filter placement on 3/21.  Hemoglobin decreased to 6.9 and received another unit of PRBC Heparin is being switched with Eliquis Her facility will not take her back until Monday  Assessment & Plan:   Acute pulmonary embolism [facility]):  pt has acute PE with CT evidence of right heart strain.   -- S/p thrombectomy and IVC filter on 3/21 -- Switch to heparin infusion with Eliquis  Acute hypoxemic respiratory failure -- Improved, currently on 3 L but saturating 100%, uses 2 L at home, likely due to acute PE and Flu. --Continue supplemental O2 to keep sats >=92%, wean as tolerated  DVT (deep venous thrombosis) (HCC) -- Heparin is being switched with Eliquis   Influenza A -Bronchodilators, as needed Mucinex --cont Tamiflu   Hypokalemia Hypomag Hypophos Hypocalcemia  --monitor and supplement PRN  Pericardial effusion:  CTA showed moderate to large pericardial effusion.  Cardio consulted --Echo showed relatively small pericardial effusion.  No need for further management.  Pancytopenia (HCC):  Likely due to chemotherapy -- hemoglobin at 6.9 after thrombectomy , ordered another unit of PRBC with resultant improvement of hemoglobin to 9.4. Total of 2 unit given during current hospitalization  Acquired hypothyroidism -Synthroid   History of hypotension -Continue home midodrine 7.5 mg 3 times daily   Multiple myeloma (HCC) -Patient is on  chemotherapy, last treatment was on 3/6   Myocardial injury:  Troponin 62, likely demand ischemia.   Compression fracture of L2 (HCC) and compression fracture of T5 vertebra (HCC) -Pain control: As needed morphine, Percocet, Tylenol -As needed Robaxin -Lidoderm patch   Fall at home, initial encounter -Fall precaution -PT  Acute urinary retention -- Patient started voiding    DVT prophylaxis: Eliquis  Code Status: DNR  Family Communication:  Level of care: Medical telemetry  Dispo:   The patient is from: SNF LTC Anticipated d/c is to: SNF LTC Anticipated d/c date is: 1-2 days-facility will take her back on Monday   Subjective and Interval History:  Patient was seen and examined today.  No new concern.  Just feeling weak.  Per patient she uses oxygen at baseline  Objective: Vitals:   10/22/23 1015 10/22/23 1025 10/22/23 1100 10/22/23 1200  BP:  118/76 108/71 112/73  Pulse: 79 91 88 80  Resp: (!) 22 (!) 22 (!) 29 (!) 26  Temp:  98.1 F (36.7 C)    TempSrc:      SpO2: 99% 98% 97% 100%  Weight:      Height:        Intake/Output Summary (Last 24 hours) at 10/22/2023 1344 Last data filed at 10/22/2023 1226 Gross per 24 hour  Intake 2067.83 ml  Output 745 ml  Net 1322.83 ml   Filed Weights   10/20/23 2038 10/21/23 0600 10/22/23 0500  Weight: 60.5 kg 58.9 kg 63.7 kg    Examination:   General.  Frail and malnourished elderly lady, in no acute distress. Pulmonary.  Lungs clear bilaterally, normal respiratory effort. CV.  Regular rate and rhythm, no JVD,  rub or murmur. Abdomen.  Soft, nontender, nondistended, BS positive. CNS.  Alert and oriented .  No focal neurologic deficit. Extremities.  No edema, no cyanosis, pulses intact and symmetrical.   Data Reviewed: I have personally reviewed labs and imaging studies  Time spent: 50 minutes  Arnetha Courser, MD [facility] If 7PM-7AM, please contact night-coverage 10/22/2023, 1:44 PM

## 2023-10-22 NOTE — Anesthesia Postprocedure Evaluation (Signed)
 Anesthesia Post Note  Patient: Kathy [name]  Procedure(s) Performed: PULMONARY THROMBECTOMY (Bilateral) IVC FILTER INSERTION  Patient location during evaluation: SICU Anesthesia Type: General Level of consciousness: awake Pain management: pain level controlled Vital Signs Assessment: post-procedure vital signs reviewed and stable Respiratory status: spontaneous breathing and patient connected to nasal cannula oxygen Cardiovascular status: stable Postop Assessment: no apparent nausea or vomiting Anesthetic complications: no Comments: 1 unit prbc being given   Encounter Notable Events  Notable Event Outcome Phase Comment  None  Intraprocedure      Last Vitals:  Vitals:   10/22/23 0745 10/22/23 0800  BP: 99/66 96/62  Pulse: 80 86  Resp: 18 19  Temp: 36.5 C 36.5 C  SpO2: 100% 100%    Last Pain:  Vitals:   10/22/23 0732  TempSrc: Oral  PainSc:                  Foye Deer

## 2023-10-23 ENCOUNTER — Inpatient Hospital Stay

## 2023-10-23 DIAGNOSIS — J101 Influenza due to other identified influenza virus with other respiratory manifestations: Secondary | ICD-10-CM | POA: Diagnosis not present

## 2023-10-23 DIAGNOSIS — I2609 Other pulmonary embolism with acute cor pulmonale: Secondary | ICD-10-CM | POA: Diagnosis not present

## 2023-10-23 DIAGNOSIS — Z8579 Personal history of other malignant neoplasms of lymphoid, hematopoietic and related tissues: Secondary | ICD-10-CM

## 2023-10-23 DIAGNOSIS — I824Y3 Acute embolism and thrombosis of unspecified deep veins of proximal lower extremity, bilateral: Secondary | ICD-10-CM | POA: Diagnosis not present

## 2023-10-23 DIAGNOSIS — R531 Weakness: Secondary | ICD-10-CM

## 2023-10-23 DIAGNOSIS — I2699 Other pulmonary embolism without acute cor pulmonale: Secondary | ICD-10-CM | POA: Diagnosis not present

## 2023-10-23 DIAGNOSIS — E876 Hypokalemia: Secondary | ICD-10-CM | POA: Diagnosis not present

## 2023-10-23 LAB — GLUCOSE, CAPILLARY
Glucose-Capillary: 48 mg/dL — ABNORMAL LOW (ref 70–99)
Glucose-Capillary: 118 mg/dL — ABNORMAL HIGH (ref 70–99)

## 2023-10-23 LAB — BLOOD GAS, ARTERIAL
Acid-base deficit: 0.8 mmol/L (ref 0.0–2.0)
Bicarbonate: 21.6 mmol/L (ref 20.0–28.0)
O2 Content: 6 L/min
O2 Saturation: 95.3 %
Patient temperature: 37
pCO2 arterial: 29 mmHg — ABNORMAL LOW (ref 32–48)
pH, Arterial: 7.48 — ABNORMAL HIGH (ref 7.35–7.45)
pO2, Arterial: 66 mmHg — ABNORMAL LOW (ref 83–108)

## 2023-10-23 LAB — BASIC METABOLIC PANEL
Anion gap: 7 (ref 5–15)
BUN: 17 mg/dL (ref 8–23)
CO2: 25 mmol/L (ref 22–32)
Calcium: 6.1 mg/dL — CL (ref 8.9–10.3)
Chloride: 111 mmol/L (ref 98–111)
Creatinine, Ser: 0.83 mg/dL (ref 0.44–1.00)
GFR, Estimated: >60 mL/min (ref >60)
Glucose, Bld: 87 mg/dL (ref 70–99)
Potassium: 3.6 mmol/L (ref 3.5–5.1)
Sodium: 143 mmol/L (ref 135–145)

## 2023-10-23 LAB — T4, FREE: Free T4: 0.9 ng/dL (ref 0.61–1.12)

## 2023-10-23 LAB — CBC
HCT: 29.1 % — ABNORMAL LOW (ref 36.0–46.0)
Hemoglobin: 10.3 g/dL — ABNORMAL LOW (ref 12.0–15.0)
MCH: 32 pg (ref 26.0–34.0)
MCHC: 35.4 g/dL (ref 30.0–36.0)
MCV: 90.4 fL (ref 80.0–100.0)
Platelets: 31 10*3/uL — ABNORMAL LOW (ref 150–400)
RBC: 3.22 MIL/uL — ABNORMAL LOW (ref 3.87–5.11)
RDW: 18.3 % — ABNORMAL HIGH (ref 11.5–15.5)
WBC: 0.3 10*3/uL — CL (ref 4.0–10.5)
nRBC: 24.2 % — ABNORMAL HIGH (ref 0.0–0.2)

## 2023-10-23 LAB — TSH: TSH: 0.594 u[IU]/mL (ref 0.350–4.500)

## 2023-10-23 LAB — MAGNESIUM: Magnesium: 1.7 mg/dL (ref 1.7–2.4)

## 2023-10-23 LAB — PHOSPHORUS: Phosphorus: 2.6 mg/dL (ref 2.5–4.6)

## 2023-10-23 MED ORDER — MORPHINE 100MG IN NS 100ML (1MG/ML) PREMIX INFUSION
5.0000 mg/h | INTRAVENOUS | Status: DC
  Administered 2023-10-23: 5 mg/h via INTRAVENOUS
  Filled 2023-10-23: qty 100

## 2023-10-23 MED ORDER — GLYCOPYRROLATE 1 MG PO TABS: 1.0000 mg | ORAL_TABLET | ORAL | Status: DC | PRN

## 2023-10-23 MED ORDER — MORPHINE SULFATE (PF) 2 MG/ML IV SOLN
2.0000 mg | INTRAVENOUS | Status: DC | PRN
  Administered 2023-10-23: 2 mg via INTRAVENOUS
  Administered 2023-10-23: 4 mg via INTRAVENOUS
  Filled 2023-10-23: qty 1
  Filled 2023-10-23: qty 2

## 2023-10-23 MED ORDER — DEXTROSE 50 % IV SOLN
25.0000 g | Freq: Once | INTRAVENOUS | Status: AC
  Administered 2023-10-23: 25 g via INTRAVENOUS

## 2023-10-23 MED ORDER — METOPROLOL TARTRATE 5 MG/5ML IV SOLN
5.0000 mg | INTRAVENOUS | Status: DC | PRN
  Administered 2023-10-23 (×2): 5 mg via INTRAVENOUS
  Filled 2023-10-23 (×2): qty 5

## 2023-10-23 MED ORDER — MORPHINE SULFATE (PF) 2 MG/ML IV SOLN
2.0000 mg | Freq: Once | INTRAVENOUS | Status: AC
  Administered 2023-10-23: 2 mg via INTRAVENOUS
  Filled 2023-10-23: qty 1

## 2023-10-23 MED ORDER — ALBUMIN HUMAN 25 % IV SOLN
25.0000 g | Freq: Once | INTRAVENOUS | Status: DC
  Filled 2023-10-23: qty 100

## 2023-10-23 MED ORDER — GLYCOPYRROLATE 0.2 MG/ML IJ SOLN: 0.2000 mg | INTRAMUSCULAR | Status: DC | PRN

## 2023-10-23 MED ORDER — HALOPERIDOL LACTATE 5 MG/ML IJ SOLN: 2.5000 mg | INTRAMUSCULAR | Status: DC | PRN

## 2023-10-23 MED ORDER — CALCIUM GLUCONATE-NACL 2-0.675 GM/100ML-% IV SOLN
2.0000 g | Freq: Once | INTRAVENOUS | Status: AC
Start: 2023-10-23 — End: 2023-10-23
  Administered 2023-10-23: 2000 mg via INTRAVENOUS
  Filled 2023-10-23: qty 100

## 2023-10-23 MED ORDER — ACETAMINOPHEN 325 MG PO TABS: 650.0000 mg | ORAL_TABLET | Freq: Four times a day (QID) | ORAL | Status: DC | PRN

## 2023-10-23 MED ORDER — MORPHINE SULFATE (PF) 2 MG/ML IV SOLN
1.0000 mg | Freq: Once | INTRAVENOUS | Status: AC
  Administered 2023-10-23: 1 mg via INTRAVENOUS
  Filled 2023-10-23: qty 1

## 2023-10-23 MED ORDER — POLYVINYL ALCOHOL 1.4 % OP SOLN: 1.0000 [drp] | Freq: Four times a day (QID) | OPHTHALMIC | Status: DC | PRN

## 2023-10-23 MED ORDER — DEXTROSE 50 % IV SOLN
INTRAVENOUS | Status: AC
  Filled 2023-10-23: qty 50

## 2023-10-23 MED ORDER — ACETAMINOPHEN 650 MG RE SUPP: 650.0000 mg | Freq: Four times a day (QID) | RECTAL | Status: DC | PRN

## 2023-10-23 MED ORDER — MIDAZOLAM HCL 2 MG/2ML IJ SOLN: 2.0000 mg | INTRAMUSCULAR | Status: DC | PRN

## 2023-10-23 MED ORDER — SODIUM CHLORIDE 0.9 % IV BOLUS
500.0000 mL | Freq: Once | INTRAVENOUS | Status: AC
  Administered 2023-10-23: 500 mL via INTRAVENOUS

## 2023-10-23 MED ORDER — MAGNESIUM SULFATE 2 GM/50ML IV SOLN
2.0000 g | Freq: Once | INTRAVENOUS | Status: AC
Start: 2023-10-23 — End: 2023-10-23
  Administered 2023-10-23: 2 g via INTRAVENOUS
  Filled 2023-10-23: qty 50

## 2023-10-24 ENCOUNTER — Encounter: Payer: Self-pay | Admitting: Vascular Surgery

## 2023-10-24 LAB — BPAM RBC
Blood Product Expiration Date: 202504192359
Blood Product Expiration Date: 202504192359
Blood Product Expiration Date: 202504202359
Blood Product Expiration Date: 202504212359
ISSUE DATE / TIME: 202503211150
ISSUE DATE / TIME: 202503220743
Unit Type and Rh: 6200
Unit Type and Rh: 6200
Unit Type and Rh: 6200
Unit Type and Rh: 6200

## 2023-10-24 LAB — TYPE AND SCREEN
ABO/RH(D): AB POS
Antibody Screen: POSITIVE
DAT, IgG: NEGATIVE
DAT, complement: NEGATIVE
Unit division: 0
Unit division: 0
Unit division: 0
Unit division: 0

## 2023-10-25 ENCOUNTER — Encounter: Payer: Self-pay | Admitting: Internal Medicine

## 2023-10-25 ENCOUNTER — Other Ambulatory Visit: Payer: Self-pay | Admitting: Hematology

## 2023-10-25 NOTE — Telephone Encounter (Signed)
 Erroneous encounter - please disregard.

## 2023-11-01 NOTE — Progress Notes (Signed)
  Interdisciplinary Goals of Care Family Meeting   Date carried out: 10/27/2023  Location of the meeting: Phone conference  Member's involved: Nurse Practitioner and Family Member or next of kin [name] Agcaoili POA  Durable Power of Attorney or acting medical decision maker: [name] Westall POA    Discussion: We discussed goals of care for [employer] .    Code status:   Code Status: Do not attempt resuscitation (DNR) - Comfort care   Disposition: In-patient comfort care  Time spent for the meeting: 0600 AM    Manuela Schwartz, NP  10/13/2023, 6:30 AM

## 2023-11-01 NOTE — Progress Notes (Signed)
       CROSS COVER NOTE  NAME: Shakirah Kirkey Rolon MRN: [id number] DOB : 02-Feb-1941 ATTENDING PHYSICIAN: Arnetha Courser, MD    Date of Service   10/14/2023   HPI/Events of Note   Patient had paroxysmal Afib with RVR through out night. Sustained at 190 to 200 around 0515 am. Confirmed by EKG On arrival patient moaning saying she is hurting all over.   Interventions   Assessment/Plan: Obvious discomfort Answering appropriately to questions Patient stating she doesn't want anything done  she just wants t0 go    10/31/2023    6:06 AM 10/03/2023    6:05 AM 10/12/2023    5:57 AM  Vitals with BMI  Systolic 93 62 161  Diastolic 81 30 124  Pulse 153 884 158    Received metop 5mg  every 5 x2 Morphine D 50 500 NS bolus See IPAL note, discussed with her brother Jonny Ruiz patient arrhythmia, her pain and her not wanting further interventions. Patient now comfort care        Donnie Mesa NP [facility] Cross Cover 7pm-7am - check amion for availability Pager [phone]

## 2023-11-01 NOTE — Progress Notes (Signed)
 Tele notified me of HR up to 180's.  HR noted to have spikes as high as 212 on tele.  Manuela Schwartz NP on call made aware and EKG obtained which showed Afib with rapid RVR.  NP at bedside for evaluation.  NS bolus given as well as Metoprolol 5mg  IV x 2 doses.  Calcium gluconate and Magnesium replaced.  NP was able to call pt's brother who will be on his way.  Order noted to make pt comfort measures as of 0600.  Total of 5mg  of Morphine given and pt finally resting easy.     10/29/2023 0444  Assess: MEWS Score  Temp 98.7 F (37.1 C)  BP (!) 86/64  MAP (mmHg) 71  Pulse Rate (!) 117  Resp (!) 22  Level of Consciousness Alert  SpO2 96 %  O2 Device Nasal Cannula  O2 Flow Rate (L/min) 2 L/min  Assess: MEWS Score  MEWS Temp 0  MEWS Systolic 1  MEWS Pulse 2  MEWS RR 1  MEWS LOC 0  MEWS Score 4  MEWS Score Color Red  Assess: if the MEWS score is Yellow or Red  Were vital signs accurate and taken at a resting state? Yes  Does the patient meet 2 or more of the SIRS criteria? Yes  Does the patient have a confirmed or suspected source of infection? No  MEWS guidelines implemented  Yes, red  Treat  MEWS Interventions Considered administering scheduled or prn medications/treatments as ordered  Take Vital Signs  Increase Vital Sign Frequency  Red: Q1hr x2, continue Q4hrs until patient remains green for 12hrs  Escalate  MEWS: Escalate Red: Discuss with charge nurse and notify provider. Consider notifying RRT. If remains red for 2 hours consider need for higher level of care  Notify: Charge Nurse/RN  Name of Charge Nurse/RN Notified Government social research officer  Assess: SIRS CRITERIA  SIRS Temperature  0  SIRS Respirations  1  SIRS Pulse 1  SIRS WBC 0  SIRS Score Sum  2   Bear Stearns BSN RN [facility] 10/12/2023, 6:49 AM

## 2023-11-01 NOTE — Progress Notes (Signed)
 Call received from [facility] stating pt's HR had been up to 190s briefly.  When I went to check on pt, she awakened easily, was appropriately responsive and denied any chest pain or SHOB or any other pain.  VS as below.  Pt remains in Afib and sustaining 115-120s.  Discussed with Manuela Schwartz NP on call.  No new orders at this time.   10/27/2023 0234  Assess: MEWS Score  Temp 98.4 F (36.9 C)  BP (!) 88/67  MAP (mmHg) 74  Pulse Rate (!) 117  Resp 20  Level of Consciousness Alert  SpO2 99 %  O2 Device Nasal Cannula  O2 Flow Rate (L/min) 2 L/min  Assess: MEWS Score  MEWS Temp 0  MEWS Systolic 1  MEWS Pulse 2  MEWS RR 0  MEWS LOC 0  MEWS Score 3  MEWS Score Color Yellow  Assess: if the MEWS score is Yellow or Red  Were vital signs accurate and taken at a resting state? Yes  Does the patient meet 2 or more of the SIRS criteria? No  MEWS guidelines implemented  Yes, yellow  Treat  MEWS Interventions Considered administering scheduled or prn medications/treatments as ordered  Take Vital Signs  Increase Vital Sign Frequency  Yellow: Q2hr x1, continue Q4hrs until patient remains green for 12hrs  Escalate  MEWS: Escalate Yellow: Discuss with charge nurse and consider notifying provider and/or RRT  Notify: Charge Nurse/RN  Name of Charge Nurse/RN Notified Government social research officer  Provider Notification  Provider Name/Title Manuela Schwartz NP  Date Provider Notified 10/13/2023  Time Provider Notified [phone]  Method of Notification Page (Secure Chat)  Notification Reason Other (Comment) (HR now 115-120, BP 88/67)  Assess: SIRS CRITERIA  SIRS Temperature  0  SIRS Respirations  0  SIRS Pulse 1  SIRS WBC 0  SIRS Score Sum  1   Bear Stearns BSN RN [facility] 10/15/2023, 3:02 AM

## 2023-11-01 NOTE — TOC Progression Note (Signed)
 Transition of Care [facility]) - Progression Note    Patient Details  Name: Kathy [name] MRN: [id number] Date of Birth: 11-Feb-1941  [facility]) CM/SW Contact  Bing Quarry, RN Phone Number: 10/22/2023, 9:07 AM  Clinical Narrative:  3/23: Patient now comfort care and provider requested hospice referral. Spoke with family, son (POA) and his spouse, and verbal permission was given to have hospital liaison for [facility] to speak to them after offering area choices. Contacted liaison via Fish farm manager for referral.    Gabriel Cirri MSN RN CM  RN Case Manager [place]  Transitions of Care Direct Dial: [phone] (Weekends Only) [facility] Main Office Phone: [phone] [facility] Fax: [phone] [email or web address]     Expected Discharge Plan: Skilled Nursing Facility Barriers to Discharge: Continued Medical Work up  Expected Discharge Plan and Services     Post Acute Care Choice: Resumption of Svcs/PTA Provider Living arrangements for the past 2 months: Skilled Nursing Facility                                       Social Determinants of Health (SDOH) Interventions SDOH Screenings   Food Insecurity: No Food Insecurity (10/20/2023)  Housing: Low Risk  (10/20/2023)  Transportation Needs: No Transportation Needs (10/20/2023)  Utilities: Not At Risk (10/20/2023)  Alcohol Screen: Low Risk  (09/14/2021)  Depression (PHQ2-9): Low Risk  (03/30/2023)  Financial Resource Strain: Low Risk  (09/14/2021)  Physical Activity: Sufficiently Active (09/14/2021)  Social Connections: Moderately Integrated (09/30/2023)  Stress: No Stress Concern Present (09/14/2021)  Tobacco Use: Low Risk  (10/19/2023)    Readmission Risk Interventions    10/20/2023   11:10 AM 10/02/2023   10:16 AM 06/17/2023   11:10 AM  Readmission Risk Prevention Plan  Transportation Screening Complete Complete Complete  Medication Review Oceanographer) Complete Complete Complete  PCP or Specialist appointment  within 3-5 days of discharge Complete Complete   HRI or Home Care Consult  Complete Complete  SW Recovery Care/Counseling Consult Complete Complete Complete  Palliative Care Screening Not Applicable Complete Not Applicable  Skilled Nursing Facility Complete Complete Complete

## 2023-11-01 NOTE — Death Summary Note (Signed)
 DEATH SUMMARY   Patient Details  Name: Kathy [name] MRN: [id number] DOB: 1941/06/17 WUX:LKGMW, Kathy Lou, MD Admission/Discharge Information   Admit Date:  November 18, 2023  Date of Death: Date of Death: (P) November 22, 2023  Time of Death: Time of Death: (P) 1050  Length of Stay: 4   Principle Cause of death: Pulmonary embolism.  Hospital Diagnoses: Principal Problem:   Acute pulmonary embolism (HCC) Active Problems:   DVT (deep venous thrombosis) (HCC)   Influenza A   Hypokalemia   Hypomagnesemia   Electrolyte disturbance   Hypocalcemia   Hypophosphatemia   Pericardial effusion   Pancytopenia (HCC)   Acquired hypothyroidism   Hypotension   Multiple myeloma (HCC)   Myocardial injury   Compression fracture of L2 (HCC)   Compression fracture of T5 vertebra (HCC)   Weakness   Fall at home, initial encounter   Acute low back pain   History of multiple myeloma   Hospital Course: Lisabeth Mian Graul is a 83 y.o. female with medical history significant of multiple myeloma on chemotherapy, hypotension on midodrine, HLD, hypothyroidism, anxiety, CKD stage IIIb, rheumatoid arthritis, stomach ulcer, pancytopenia, who presents with cough, SOB, lightheadedness and abnormal lab with hypokalemia.   She was found to have extensive pulmonary embolism with CT evidence of right heart strain.  Vascular surgery was consulted s/p mechanical thrombectomy and thrombolysis along with IVC filter placement on 3/21.  Patient initially received heparin infusion and later after the procedure switched to Eliquis.  She was also tested positive for influenza A and was receiving Tamiflu along with supportive care.  Patient has leukopenia and thrombocytopenia likely due to chemotherapy as she is still undergoing chemotherapy for multiple myeloma.  She was also found to have mildly elevated troponin likely due to demand ischemia with extensive PE.  Will  Patient was also found to have moderate to large  pericardial effusion on CTA, cardiology was consulted at that time an echocardiogram showed relatively small pericardial effusion which does not need any intervention per cardiology.  Patient received 2 units of PRBC due to decreased in hemoglobin and hemoglobin improved to 9.4.  She was also found to have compression fractures of L5 at T5 with history of multiple myeloma and a recent fall.  Which was manage conservatively.  Patient was stable on 3/22 and plan was to send her back to her facility [birth year].  During the night patient developed significant generalized aches and pain along with new onset A-fib with RVR.  Heart rate varied up to 200s.  Patient at that point told the night on call provider that she is suffering for a while and does not want to carry on at this time.  She requested to be comfortable and willingly with wants to withdraw all the management.  Patient was alert and oriented x 4.  Case was discussed with her brother who is also her POA and it after discussion with brother and patient it was decided to proceed with comfort care only.   Patient was very clear with nursing staff and provider that she does not want any more suffering and that now it is her time to go.  Patient received multiple doses of morphine to help with the pain  and shortness of breath overnight.  Comfort measures were initiated.  The patient was sedated and appears comfortable with seen earlier in the morning in the presence of multiple family members.  Later around 1050 patient passed peacefully with the presence of family.  Procedures: Mechanical  thrombectomy and IVC filter placement  Consultations: Vascular surgeon  The results of significant diagnostics from this hospitalization (including imaging, microbiology, ancillary and laboratory) are listed below for reference.   Significant Diagnostic Studies: DG Chest Port 1 View Result Date: 10/17/2023 CLINICAL DATA:  [id number].  Tachycardia. EXAM:  PORTABLE CHEST 1 VIEW COMPARISON:  Portable chest 10/19/2023. FINDINGS: 5:35 a.m. There is increased opacity in the retrocardiac area consistent with left lower lobe consolidation or aspiration. There is a small parapneumonic left pleural effusion also newly seen. Additional bilateral mid perihilar opacities suspect additional new airspace disease. There is stable cardiomegaly. No evidence for CHF. Remaining lungs are clear. The mediastinum is stable. There is calcification of the transverse aorta. Right IJ port catheter again terminates at the superior cavoatrial junction. There are bilateral reverse shoulder arthroplasties. IMPRESSION: 1. Increased opacity in the retrocardiac area consistent with left lower lobe consolidation or aspiration. 2. Small parapneumonic left pleural effusion. 3. Additional bilateral mid perihilar opacities suspect additional new airspace disease. 4. Stable cardiomegaly. 5. Aortic atherosclerosis. Electronically Signed   By: Almira Bar M.D.   On: 10/11/2023 06:38   PERIPHERAL VASCULAR CATHETERIZATION Result Date: 10/21/2023 See surgical note for result.  ECHOCARDIOGRAM COMPLETE Result Date: 10/20/2023    ECHOCARDIOGRAM REPORT   Patient Name:   Kathy [name] Date of Exam: 10/20/2023 Medical Rec #:  [id number]          Height:       61.0 in Accession #:    [id number]         Weight:       166.9 lb Date of Birth:  [birth year]          BSA:          1.749 m Patient Age:    82 years           BP:           81/67 mmHg Patient Gender: F                  HR:           119 bpm. Exam Location:  [facility] Procedure: 2D Echo, Cardiac Doppler and Color Doppler (Both Spectral and Color            Flow Doppler were utilized during procedure). STAT ECHO Indications:     Pulmonary embolus  History:         Patient has prior history of Echocardiogram examinations, most                  recent 03/11/2022. Arrythmias:RBBB; Risk Factors:Hypertension,                  Diabetes and Sleep Apnea. CKD,  Influenza +, Pulmonary embolus,                  DVT.  Sonographer:     Mikki Harbor Referring Phys:  [id number] CHRISTOPHER RONALD BERGE Diagnosing Phys: Julien Nordmann MD IMPRESSIONS  1. Left ventricular ejection fraction, by estimation, is 60 to 65%. The left ventricle has normal function. The left ventricle has no regional wall motion abnormalities. Left ventricular diastolic parameters are consistent with Grade I diastolic dysfunction (impaired relaxation).  2. Right ventricular systolic function is mildly reduced. The right ventricular size is moderately enlarged. There is moderately elevated pulmonary artery systolic pressure. The estimated right ventricular systolic pressure is 46.7 mmHg.  3. A small pericardial effusion is present estimated 0.75 cm off the LV free  wall. There is no evidence of cardiac tamponade.  4. The mitral valve is normal in structure. No evidence of mitral valve regurgitation. No evidence of mitral stenosis.  5. The aortic valve is normal in structure. Aortic valve regurgitation is not visualized. No aortic stenosis is present.  6. There is borderline dilatation of the aortic root, measuring 38 mm.  7. The inferior vena cava is normal in size with greater than 50% respiratory variability, suggesting right atrial pressure of 3 mmHg. FINDINGS  Left Ventricle: Left ventricular ejection fraction, by estimation, is 60 to 65%. The left ventricle has normal function. The left ventricle has no regional wall motion abnormalities. Strain was performed and the global longitudinal strain is indeterminate. The left ventricular internal cavity size was normal in size. There is no left ventricular hypertrophy. Left ventricular diastolic parameters are consistent with Grade I diastolic dysfunction (impaired relaxation). Right Ventricle: The right ventricular size is moderately enlarged. No increase in right ventricular wall thickness. Right ventricular systolic function is mildly reduced. There is  moderately elevated pulmonary artery systolic pressure. The tricuspid regurgitant velocity is 3.11 m/s, and with an assumed right atrial pressure of 8 mmHg, the estimated right ventricular systolic pressure is 46.7 mmHg. Left Atrium: Left atrial size was normal in size. Right Atrium: Right atrial size was normal in size. Pericardium: A small pericardial effusion is present. There is no evidence of cardiac tamponade. Mitral Valve: The mitral valve is normal in structure. No evidence of mitral valve regurgitation. No evidence of mitral valve stenosis. MV peak gradient, 8.0 mmHg. The mean mitral valve gradient is 2.0 mmHg. Tricuspid Valve: The tricuspid valve is normal in structure. Tricuspid valve regurgitation is mild . No evidence of tricuspid stenosis. Aortic Valve: The aortic valve is normal in structure. Aortic valve regurgitation is not visualized. No aortic stenosis is present. Aortic valve mean gradient measures 2.0 mmHg. Aortic valve peak gradient measures 4.8 mmHg. Aortic valve area, by VTI measures 2.93 cm. Pulmonic Valve: The pulmonic valve was normal in structure. Pulmonic valve regurgitation is not visualized. No evidence of pulmonic stenosis. Aorta: The aortic root is normal in size and structure. There is borderline dilatation of the aortic root, measuring 38 mm. Venous: The inferior vena cava is normal in size with greater than 50% respiratory variability, suggesting right atrial pressure of 3 mmHg. IAS/Shunts: No atrial level shunt detected by color flow Doppler. Additional Comments: 3D was performed not requiring image post processing on an independent workstation and was indeterminate.  LEFT VENTRICLE PLAX 2D LVIDd:         3.50 cm   Diastology LVIDs:         2.40 cm   LV e' medial:    5.77 cm/s LV PW:         1.10 cm   LV E/e' medial:  12.5 LV IVS:        1.00 cm   LV e' lateral:   8.59 cm/s LVOT diam:     2.10 cm   LV E/e' lateral: 8.4 LV SV:         59 LV SV Index:   34 LVOT Area:     3.46 cm   RIGHT VENTRICLE RV Basal diam:  3.50 cm RV Mid diam:    3.90 cm RV S prime:     27.80 cm/s LEFT ATRIUM             Index        RIGHT ATRIUM  Index LA diam:        1.60 cm 0.91 cm/m   RA Area:     15.40 cm LA Vol (A2C):   30.2 ml 17.27 ml/m  RA Volume:   42.00 ml  24.01 ml/m LA Vol (A4C):   29.5 ml 16.87 ml/m LA Biplane Vol: 30.8 ml 17.61 ml/m  AORTIC VALVE                    PULMONIC VALVE AV Area (Vmax):    3.59 cm     PV Vmax:       0.88 m/s AV Area (Vmean):   3.84 cm     PV Peak grad:  3.1 mmHg AV Area (VTI):     2.93 cm AV Vmax:           109.00 cm/s AV Vmean:          62.000 cm/s AV VTI:            0.202 m AV Peak Grad:      4.8 mmHg AV Mean Grad:      2.0 mmHg LVOT Vmax:         113.00 cm/s LVOT Vmean:        68.700 cm/s LVOT VTI:          0.171 m LVOT/AV VTI ratio: 0.85  AORTA Ao Root diam: 3.80 cm MITRAL VALVE                TRICUSPID VALVE MV Area (PHT): 5.13 cm     TR Peak grad:   38.7 mmHg MV Area VTI:   2.41 cm     TR Vmax:        311.00 cm/s MV Peak grad:  8.0 mmHg MV Mean grad:  2.0 mmHg     SHUNTS MV Vmax:       1.41 m/s     Systemic VTI:  0.17 m MV Vmean:      64.4 cm/s    Systemic Diam: 2.10 cm MV Decel Time: 148 msec MV E velocity: 72.30 cm/s MV A velocity: 141.00 cm/s MV E/A ratio:  0.51 Julien Nordmann MD Electronically signed by Julien Nordmann MD Signature Date/Time: 10/20/2023/11:46:43 AM    Final    CT Angio Chest PE W/Cm &/Or Wo Cm Result Date: 10/19/2023 CLINICAL DATA:  Hypokalemia weakness.  Multiple myeloma. EXAM: CT ANGIOGRAPHY CHEST WITH CONTRAST TECHNIQUE: Multidetector CT imaging of the chest was performed using the standard protocol during bolus administration of intravenous contrast. Multiplanar CT image reconstructions and MIPs were obtained to evaluate the vascular anatomy. RADIATION DOSE REDUCTION: This exam was performed according to the departmental dose-optimization program which includes automated exposure control, adjustment of the mA and/or kV according  to patient size and/or use of iterative reconstruction technique. CONTRAST:  75mL OMNIPAQUE IOHEXOL 350 MG/ML SOLN COMPARISON:  Chest radiograph 10/19/2023 and CT scan 03/11/2023 FINDINGS: Cardiovascular: Acute pulmonary embolus in the right and left pulmonary arteries and in their lobar and segmental branches. Clot burden is high. Right ventricular to left ventricular ratio 1.45 compatible with right heart dysfunction/at least submassive pulmonary embolus. Enlarged main pulmonary artery compatible pulmonary arterial hypertension. There is a new moderate to large pericardial effusion compared to 03/11/2023 likewise not present on the CT abdomen from 05/11/2023. Atherosclerotic aorta and branch vessels. Poor visualization of the circumflex coronary artery, possibly diminutive. Right Port-A-Cath tip: Right atrium. Mediastinum/Nodes: Mildly dilated and thick-walled mid and distal thoracic esophagus. Air fluid level in the  esophagus, possibly from dysmotility or reflux. Lungs/Pleura: Hazy peripheral ground-glass opacities in both lungs nonspecific in could be a manifestation of pulmonary hemorrhage, edema, atypical pneumonia, or acute hypersensitivity pneumonitis. Atelectasis in both lower lobes. Substantial airway plugging both lower lobes. Upper Abdomen: Nonobstructive left nephrolithiasis. Postoperative findings in the stomach. Cholecystectomy. Musculoskeletal: Bilateral verse shoulder arthroplasties. Various scattered speckled lucency throughout the bony structures likely manifestation of myeloma. Thoracic spondylosis. Deformity of the sternal body and manubrium likely related to prior fractures which may be pathologic, but not present on 03/11/2023. New anterior wedging at the T5 vertebral body compatible with new anterior wedge compression fracture. New superior endplate compression fracture at L2. Review of the MIP images confirms the above findings. IMPRESSION: 1. Acute pulmonary embolus in the right and left  pulmonary arteries and in their lobar and segmental branches. Clot burden is high. Positive for acute PE with CT evidence of right heart strain (RV/LV Ratio = 1.45) consistent with at least submassive (intermediate risk) PE. The presence of right heart strain has been associated with an increased risk of morbidity and mortality. Please refer to the "Code PE Focused" order set in EPIC. 2. New moderate to large pericardial effusion compared to 03/11/2023 likewise not present on the CT abdomen from 05/11/2023. Tamponade not excluded, and the picture may be difficult to sort on physical exam due to the pulmonary embolus with right heart dysfunction. If clinically warranted, echocardiography could be utilized for further characterization. 3. Hazy peripheral ground-glass opacities in both lungs nonspecific in could be a manifestation of pulmonary hemorrhage, edema, atypical pneumonia, or acute hypersensitivity pneumonitis. 4. Substantial airway plugging in both lower lobes with atelectasis in both lower lobes. 5. Mildly dilated and thick-walled mid and distal thoracic esophagus. Air fluid level in the esophagus, possibly from dysmotility or reflux. 6. New anterior wedge compression fracture at T5. New superior endplate compression fracture at L2. Partially healed new fractures of the sternal body and manubrium. 7.  Aortic Atherosclerosis (ICD10-I70.0). Critical Value/emergent results were called by telephone at the time of interpretation on 10/19/2023 at 7:54 pm to provider Bing Neighbors , who verbally acknowledged these results. Electronically Signed   By: Gaylyn Rong M.D.   On: 10/19/2023 19:56   US Venous Img Lower Bilateral (DVT) Result Date: 10/19/2023 CLINICAL DATA:  Leg swelling EXAM: BILATERAL LOWER EXTREMITY VENOUS DOPPLER ULTRASOUND TECHNIQUE: Gray-scale sonography with graded compression, as well as color Doppler and duplex ultrasound were performed to evaluate the lower extremity deep venous systems from  the level of the common femoral vein and including the common femoral, femoral, profunda femoral, popliteal and calf veins including the posterior tibial, peroneal and gastrocnemius veins when visible. The superficial great saphenous vein was also interrogated. Spectral Doppler was utilized to evaluate flow at rest and with distal augmentation maneuvers in the common femoral, femoral and popliteal veins. COMPARISON:  None Available. FINDINGS: RIGHT LOWER EXTREMITY Common Femoral Vein: Nonocclusive thrombus. Vessel partially compressible. Saphenofemoral Junction: No evidence of thrombus. Normal compressibility and flow on color Doppler imaging. Profunda Femoral Vein: No evidence of thrombus. Normal compressibility and flow on color Doppler imaging. Femoral Vein: Nonocclusive thrombus noted. Vessel partially compressible. Popliteal Vein: Nonocclusive thrombus noted. Vessel partially compressible. Calf Veins: Calf veins poorly visualized. Superficial Great Saphenous Vein: No evidence of thrombus. Normal compressibility. Venous Reflux:  None visualized Other Findings:  None. LEFT LOWER EXTREMITY Common Femoral Vein: Nonocclusive thrombus. Vessel is partially compressible. Saphenofemoral Junction: Nonocclusive thrombus. Profunda Femoral Vein: No evidence of thrombus. Normal compressibility and  flow on color Doppler imaging. Femoral Vein: Nonocclusive thrombus.  Vessel partially compressible. Popliteal Vein: Nonocclusive thrombus. Vessel partially compressible. Calf Veins: Not well visualized. Superficial Great Saphenous Vein: Nonocclusive thrombus. Venous Reflux:  None. Other Findings:  None. IMPRESSION: In the right lower extremity, nonocclusive thrombus noted in the right common femoral vein, femoral vein and popliteal vein. In the left lower extremity, nonocclusive thrombus noted in the left common femoral vein, femoral vein, popliteal vein, and greater saphenous vein. Electronically Signed   By: Charlett Nose M.D.    On: 10/19/2023 19:24   CT Head Wo Contrast Result Date: 10/19/2023 CLINICAL DATA:  Head trauma, minor (Age >= 65y); Neck trauma (Age >= 65y). History of multiple myeloma. EXAM: CT HEAD WITHOUT CONTRAST CT CERVICAL SPINE WITHOUT CONTRAST TECHNIQUE: Multidetector CT imaging of the head and cervical spine was performed following the standard protocol without intravenous contrast. Multiplanar CT image reconstructions of the cervical spine were also generated. RADIATION DOSE REDUCTION: This exam was performed according to the departmental dose-optimization program which includes automated exposure control, adjustment of the mA and/or kV according to patient size and/or use of iterative reconstruction technique. COMPARISON:  CT scan head from 05/11/2023 and CT scan neck from 03/23/2005. FINDINGS: CT HEAD FINDINGS Brain: No evidence of acute infarction, hemorrhage, hydrocephalus, extra-axial collection or mass lesion/mass effect. There is bilateral periventricular hypodensity, which is non-specific but most likely seen in the settings of microvascular ischemic changes. Mild in extent. Otherwise normal appearance of brain parenchyma. Cerebral volume loss with enlargement of the ventricles. Vascular: No hyperdense vessel or unexpected calcification. Intracranial arteriosclerosis. Skull: Normal. Negative for fracture. There are multiple lytic lesions throughout the bones, compatible with patient's known history of multiple myeloma. No pathological fracture. Sinuses/Orbits: Mild-to-moderate mucoperiosteal thickening noted in the right ethmoidal air cells. There is small air-fluid level in the right chamber of the sphenoid sinus. Correlate clinically for sinusitis. Other: Visualized mastoid air cells are unremarkable. No mastoid effusion. CT CERVICAL SPINE FINDINGS Alignment: There is reversal of cervical lordosis. This examination does not assess for ligamentous injury or stability. Skull base and vertebrae: No acute  fracture. No primary bone lesion. There are multiple lytic lesions throughout the bones, compatible with patient's known history of multiple myeloma. No pathological fracture. Soft tissues and spinal canal: No prevertebral fluid or swelling. No visible canal hematoma. Disc levels: Moderate to severe multilevel degenerative changes noted characterized by reduced intervertebral disc height (most pronounced at C3-4 level), endplate sclerosis/irregularity, facet arthropathy and marginal osteophyte formation. Upper chest: Negative. IMPRESSION: 1. No acute intracranial abnormality. 2. No acute osseous injury of the cervical spine. 3. Multiple lytic lesions throughout the bones, compatible with patient's known history of multiple myeloma. No pathological fracture. 4. Multiple other observations, as described above. Electronically Signed   By: Jules Schick M.D.   On: 10/19/2023 18:44   CT Cervical Spine Wo Contrast Result Date: 10/19/2023 CLINICAL DATA:  Head trauma, minor (Age >= 65y); Neck trauma (Age >= 65y). History of multiple myeloma. EXAM: CT HEAD WITHOUT CONTRAST CT CERVICAL SPINE WITHOUT CONTRAST TECHNIQUE: Multidetector CT imaging of the head and cervical spine was performed following the standard protocol without intravenous contrast. Multiplanar CT image reconstructions of the cervical spine were also generated. RADIATION DOSE REDUCTION: This exam was performed according to the departmental dose-optimization program which includes automated exposure control, adjustment of the mA and/or kV according to patient size and/or use of iterative reconstruction technique. COMPARISON:  CT scan head from 05/11/2023 and CT scan neck from  03/23/2005. FINDINGS: CT HEAD FINDINGS Brain: No evidence of acute infarction, hemorrhage, hydrocephalus, extra-axial collection or mass lesion/mass effect. There is bilateral periventricular hypodensity, which is non-specific but most likely seen in the settings of microvascular  ischemic changes. Mild in extent. Otherwise normal appearance of brain parenchyma. Cerebral volume loss with enlargement of the ventricles. Vascular: No hyperdense vessel or unexpected calcification. Intracranial arteriosclerosis. Skull: Normal. Negative for fracture. There are multiple lytic lesions throughout the bones, compatible with patient's known history of multiple myeloma. No pathological fracture. Sinuses/Orbits: Mild-to-moderate mucoperiosteal thickening noted in the right ethmoidal air cells. There is small air-fluid level in the right chamber of the sphenoid sinus. Correlate clinically for sinusitis. Other: Visualized mastoid air cells are unremarkable. No mastoid effusion. CT CERVICAL SPINE FINDINGS Alignment: There is reversal of cervical lordosis. This examination does not assess for ligamentous injury or stability. Skull base and vertebrae: No acute fracture. No primary bone lesion. There are multiple lytic lesions throughout the bones, compatible with patient's known history of multiple myeloma. No pathological fracture. Soft tissues and spinal canal: No prevertebral fluid or swelling. No visible canal hematoma. Disc levels: Moderate to severe multilevel degenerative changes noted characterized by reduced intervertebral disc height (most pronounced at C3-4 level), endplate sclerosis/irregularity, facet arthropathy and marginal osteophyte formation. Upper chest: Negative. IMPRESSION: 1. No acute intracranial abnormality. 2. No acute osseous injury of the cervical spine. 3. Multiple lytic lesions throughout the bones, compatible with patient's known history of multiple myeloma. No pathological fracture. 4. Multiple other observations, as described above. Electronically Signed   By: Jules Schick M.D.   On: 10/19/2023 18:44   DG Chest Portable 1 View Result Date: 10/19/2023 CLINICAL DATA:  Cough.  Weakness. EXAM: PORTABLE CHEST 1 VIEW COMPARISON:  09/30/2023. FINDINGS: Low lung volume. There are  nonspecific opacities throughout bilateral lungs which are essentially similar to the prior study and may represent underlying atelectasis/scarring. Bilateral lung fields are otherwise clear. No acute consolidation or lung collapse. Bilateral costophrenic angles are clear. Normal cardio-mediastinal silhouette. No acute osseous abnormalities. Bilateral shoulder arthroplasty noted. The soft tissues are within normal limits. Right-sided CT Port-A-Cath is seen with its tip overlying the cavoatrial junction region. IMPRESSION: No acute cardiopulmonary abnormality. Electronically Signed   By: Jules Schick M.D.   On: 10/19/2023 18:36   DG Chest Portable 1 View Result Date: 09/30/2023 CLINICAL DATA:  Hypertension, weakness. EXAM: PORTABLE CHEST 1 VIEW COMPARISON:  May 11, 2023. FINDINGS: The heart size and mediastinal contours are within normal limits. No acute pulmonary disease is noted. 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 Right internal jugular Port-A-Cath is noted. IMPRESSION: No active disease. Electronically Signed   By: Lupita Raider M.D.   On: 09/30/2023 15:04    Microbiology: Recent Results (from the past 240 hours)  Resp panel by RT-PCR (RSV, Flu A&B, Covid) Anterior Nasal Swab     Status: Abnormal   Collection Time: 10/19/23  5:57 PM   Specimen: Anterior Nasal Swab  Result Value Ref Range Status   SARS Coronavirus 2 by RT PCR NEGATIVE NEGATIVE Final    Comment: (NOTE) SARS-CoV-2 target nucleic acids are NOT DETECTED.  The SARS-CoV-2 RNA is generally detectable in upper respiratory specimens during the acute phase of infection. The lowest concentration of SARS-CoV-2 viral copies this assay can detect is 138 copies/mL. A negative result does not preclude SARS-Cov-2 infection and should not be used as the sole basis for treatment or other patient management decisions. A negative result may occur with  improper  specimen collection/handling, submission of specimen other than  nasopharyngeal swab, presence of viral mutation(s) within the areas targeted by this assay, and inadequate number of viral copies(<138 copies/mL). A negative result must be combined with clinical observations, patient history, and epidemiological information. The expected result is Negative.  Fact Sheet for Patients:  BloggerCourse.com  Fact Sheet for Healthcare Providers:  SeriousBroker.it  This test is no t yet approved or cleared by the [place] FDA and  has been authorized for detection and/or diagnosis of SARS-CoV-2 by FDA under an Emergency Use Authorization (EUA). This EUA will remain  in effect (meaning this test can be used) for the duration of the COVID-19 declaration under Section 564(b)(1) of the Act, 21 U.S.C.section 360bbb-3(b)(1), unless the authorization is terminated  or revoked sooner.       Influenza A by PCR POSITIVE (A) NEGATIVE Final   Influenza B by PCR NEGATIVE NEGATIVE Final    Comment: (NOTE) The Xpert Xpress SARS-CoV-2/FLU/RSV plus assay is intended as an aid in the diagnosis of influenza from Nasopharyngeal swab specimens and should not be used as a sole basis for treatment. Nasal washings and aspirates are unacceptable for Xpert Xpress SARS-CoV-2/FLU/RSV testing.  Fact Sheet for Patients: BloggerCourse.com  Fact Sheet for Healthcare Providers: SeriousBroker.it  This test is not yet approved or cleared by the [place] FDA and has been authorized for detection and/or diagnosis of SARS-CoV-2 by FDA under an Emergency Use Authorization (EUA). This EUA will remain in effect (meaning this test can be used) for the duration of the COVID-19 declaration under Section 564(b)(1) of the Act, 21 U.S.C. section 360bbb-3(b)(1), unless the authorization is terminated or revoked.     Resp Syncytial Virus by PCR NEGATIVE NEGATIVE Final    Comment:  (NOTE) Fact Sheet for Patients: BloggerCourse.com  Fact Sheet for Healthcare Providers: SeriousBroker.it  This test is not yet approved or cleared by the [place] FDA and has been authorized for detection and/or diagnosis of SARS-CoV-2 by FDA under an Emergency Use Authorization (EUA). This EUA will remain in effect (meaning this test can be used) for the duration of the COVID-19 declaration under Section 564(b)(1) of the Act, 21 U.S.C. section 360bbb-3(b)(1), unless the authorization is terminated or revoked.  Performed at [facility], [address] [place], [place] [address]   MRSA Next Gen by PCR, Nasal     Status: None   Collection Time: 10/20/23  8:40 PM   Specimen: Nasal Mucosa; Nasal Swab  Result Value Ref Range Status   MRSA by PCR Next Gen NOT DETECTED NOT DETECTED Final    Comment: (NOTE) The GeneXpert MRSA Assay (FDA approved for NASAL specimens only), is one component of a comprehensive MRSA colonization surveillance program. It is not intended to diagnose MRSA infection nor to guide or monitor treatment for MRSA infections. Test performance is not FDA approved in patients less than 104 years old. Performed at [facility], [address] [place], [place] [address]     Time spent: >30 minutes  This record has been created using [employer] voice recognition software. Errors have been sought and corrected,but may not always be located. Such creation errors do not reflect on the standard of care.   Signed: Arnetha Courser, MD 10/15/2023

## 2023-11-01 NOTE — Progress Notes (Signed)
 Pt time of death was 1050. Pts family was present. [employer] called the Clinical research associate spoke to [employer]. Ref# [id number]. [facility] called and will be coming to pick up remains.

## 2023-11-01 NOTE — Progress Notes (Signed)
       CROSS COVER NOTE  NAME: Kathy [name] MRN: [id number] DOB : 1941/06/11 ATTENDING PHYSICIAN: Arnetha Courser, MD    Date of Service   10/13/2023   HPI/Events of Note   3 bolus doses morphine given over last hour without continued relief of pain  Interventions   Assessment/Plan: Continuous morphine infusion ordered X X       Donnie Mesa NP [facility] Cross Cover 7pm-7am - check amion for availability Pager [phone]

## 2023-11-01 DEATH — deceased

## 2023-11-03 ENCOUNTER — Inpatient Hospital Stay: Payer: 59 | Admitting: Hematology

## 2023-11-03 ENCOUNTER — Inpatient Hospital Stay: Payer: 59

## 2023-11-17 ENCOUNTER — Inpatient Hospital Stay

## 2023-11-17 ENCOUNTER — Inpatient Hospital Stay: Admitting: Hematology

## 2023-12-01 ENCOUNTER — Inpatient Hospital Stay: Admitting: Hematology

## 2023-12-01 ENCOUNTER — Inpatient Hospital Stay
